# Patient Record
Sex: Female | Born: 1965 | Race: Black or African American | Hispanic: No | Marital: Single | Smoking: Former smoker
Health system: Southern US, Community
[De-identification: ages and names within clinical notes are randomized; demographics above are authoritative.]

## PROBLEM LIST (undated history)

## (undated) DIAGNOSIS — C801 Malignant (primary) neoplasm, unspecified: Secondary | ICD-10-CM

## (undated) DIAGNOSIS — K5730 Diverticulosis of large intestine without perforation or abscess without bleeding: Secondary | ICD-10-CM

## (undated) DIAGNOSIS — S24153A Other incomplete lesion at T7-T10 level of thoracic spinal cord, initial encounter: Secondary | ICD-10-CM

## (undated) DIAGNOSIS — N838 Other noninflammatory disorders of ovary, fallopian tube and broad ligament: Secondary | ICD-10-CM

## (undated) DIAGNOSIS — H409 Unspecified glaucoma: Secondary | ICD-10-CM

## (undated) DIAGNOSIS — G629 Polyneuropathy, unspecified: Secondary | ICD-10-CM

## (undated) DIAGNOSIS — M5126 Other intervertebral disc displacement, lumbar region: Secondary | ICD-10-CM

## (undated) DIAGNOSIS — O1490 Unspecified pre-eclampsia, unspecified trimester: Secondary | ICD-10-CM

## (undated) DIAGNOSIS — F4310 Post-traumatic stress disorder, unspecified: Secondary | ICD-10-CM

## (undated) DIAGNOSIS — B019 Varicella without complication: Secondary | ICD-10-CM

## (undated) DIAGNOSIS — G43909 Migraine, unspecified, not intractable, without status migrainosus: Secondary | ICD-10-CM

## (undated) DIAGNOSIS — M1990 Unspecified osteoarthritis, unspecified site: Secondary | ICD-10-CM

## (undated) DIAGNOSIS — D500 Iron deficiency anemia secondary to blood loss (chronic): Secondary | ICD-10-CM

## (undated) DIAGNOSIS — N920 Excessive and frequent menstruation with regular cycle: Secondary | ICD-10-CM

## (undated) DIAGNOSIS — R739 Hyperglycemia, unspecified: Secondary | ICD-10-CM

## (undated) DIAGNOSIS — K219 Gastro-esophageal reflux disease without esophagitis: Secondary | ICD-10-CM

## (undated) DIAGNOSIS — R569 Unspecified convulsions: Secondary | ICD-10-CM

## (undated) DIAGNOSIS — F909 Attention-deficit hyperactivity disorder, unspecified type: Secondary | ICD-10-CM

## (undated) DIAGNOSIS — Z9289 Personal history of other medical treatment: Secondary | ICD-10-CM

## (undated) DIAGNOSIS — J449 Chronic obstructive pulmonary disease, unspecified: Secondary | ICD-10-CM

## (undated) DIAGNOSIS — I2699 Other pulmonary embolism without acute cor pulmonale: Secondary | ICD-10-CM

## (undated) DIAGNOSIS — R7989 Other specified abnormal findings of blood chemistry: Secondary | ICD-10-CM

## (undated) DIAGNOSIS — I10 Essential (primary) hypertension: Secondary | ICD-10-CM

## (undated) DIAGNOSIS — E119 Type 2 diabetes mellitus without complications: Secondary | ICD-10-CM

## (undated) DIAGNOSIS — E042 Nontoxic multinodular goiter: Secondary | ICD-10-CM

## (undated) DIAGNOSIS — M419 Scoliosis, unspecified: Secondary | ICD-10-CM

## (undated) DIAGNOSIS — J45909 Unspecified asthma, uncomplicated: Secondary | ICD-10-CM

## (undated) HISTORY — DX: Other intervertebral disc displacement, lumbar region: M51.26

## (undated) HISTORY — PX: CHOLECYSTECTOMY: SHX55

## (undated) HISTORY — DX: Nontoxic multinodular goiter: E04.2

## (undated) HISTORY — DX: Diverticulosis of large intestine without perforation or abscess without bleeding: K57.30

## (undated) HISTORY — DX: Chronic obstructive pulmonary disease, unspecified: J44.9

## (undated) HISTORY — DX: Type 2 diabetes mellitus without complications: E11.9

---
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
## 2005-01-02 ENCOUNTER — Ambulatory Visit: Payer: Self-pay | Admitting: Internal Medicine

## 2005-05-06 ENCOUNTER — Emergency Department (HOSPITAL_COMMUNITY): Admission: EM | Admit: 2005-05-06 | Discharge: 2005-05-06 | Payer: Self-pay | Admitting: Emergency Medicine

## 2005-08-02 ENCOUNTER — Emergency Department (HOSPITAL_COMMUNITY): Admission: EM | Admit: 2005-08-02 | Discharge: 2005-08-02 | Payer: Self-pay | Admitting: Emergency Medicine

## 2005-08-07 ENCOUNTER — Ambulatory Visit: Payer: Self-pay | Admitting: Internal Medicine

## 2005-08-08 ENCOUNTER — Ambulatory Visit (HOSPITAL_COMMUNITY): Admission: RE | Admit: 2005-08-08 | Discharge: 2005-08-08 | Payer: Self-pay | Admitting: Internal Medicine

## 2005-08-19 ENCOUNTER — Ambulatory Visit: Payer: Self-pay | Admitting: Internal Medicine

## 2005-12-11 ENCOUNTER — Ambulatory Visit: Payer: Self-pay | Admitting: Family Medicine

## 2006-01-09 ENCOUNTER — Ambulatory Visit (HOSPITAL_COMMUNITY): Admission: RE | Admit: 2006-01-09 | Discharge: 2006-01-09 | Payer: Self-pay | Admitting: Family Medicine

## 2006-01-10 ENCOUNTER — Ambulatory Visit: Payer: Self-pay | Admitting: Family Medicine

## 2006-02-15 ENCOUNTER — Emergency Department (HOSPITAL_COMMUNITY): Admission: EM | Admit: 2006-02-15 | Discharge: 2006-02-16 | Payer: Self-pay | Admitting: Emergency Medicine

## 2008-02-17 ENCOUNTER — Emergency Department (HOSPITAL_COMMUNITY): Admission: EM | Admit: 2008-02-17 | Discharge: 2008-02-17 | Payer: Self-pay | Admitting: Emergency Medicine

## 2014-07-01 DIAGNOSIS — I2699 Other pulmonary embolism without acute cor pulmonale: Secondary | ICD-10-CM

## 2014-07-01 HISTORY — DX: Other pulmonary embolism without acute cor pulmonale: I26.99

## 2014-08-30 DIAGNOSIS — Z9289 Personal history of other medical treatment: Secondary | ICD-10-CM

## 2014-08-30 HISTORY — DX: Personal history of other medical treatment: Z92.89

## 2014-09-07 ENCOUNTER — Inpatient Hospital Stay (HOSPITAL_COMMUNITY): Payer: Medicaid Other

## 2014-09-07 ENCOUNTER — Inpatient Hospital Stay (HOSPITAL_COMMUNITY)
Admission: EM | Admit: 2014-09-07 | Discharge: 2014-09-09 | DRG: 812 | Disposition: A | Discharge status: Home / Self Care | Payer: Medicaid Other | Attending: Internal Medicine | Admitting: Internal Medicine

## 2014-09-07 ENCOUNTER — Encounter (HOSPITAL_COMMUNITY): Payer: Self-pay | Admitting: Emergency Medicine

## 2014-09-07 DIAGNOSIS — G629 Polyneuropathy, unspecified: Secondary | ICD-10-CM | POA: Diagnosis present

## 2014-09-07 DIAGNOSIS — M419 Scoliosis, unspecified: Secondary | ICD-10-CM | POA: Diagnosis present

## 2014-09-07 DIAGNOSIS — D259 Leiomyoma of uterus, unspecified: Secondary | ICD-10-CM | POA: Diagnosis present

## 2014-09-07 DIAGNOSIS — D509 Iron deficiency anemia, unspecified: Secondary | ICD-10-CM

## 2014-09-07 DIAGNOSIS — N920 Excessive and frequent menstruation with regular cycle: Secondary | ICD-10-CM | POA: Diagnosis present

## 2014-09-07 DIAGNOSIS — F4310 Post-traumatic stress disorder, unspecified: Secondary | ICD-10-CM | POA: Diagnosis present

## 2014-09-07 DIAGNOSIS — R0609 Other forms of dyspnea: Secondary | ICD-10-CM | POA: Insufficient documentation

## 2014-09-07 DIAGNOSIS — M1990 Unspecified osteoarthritis, unspecified site: Secondary | ICD-10-CM | POA: Diagnosis present

## 2014-09-07 DIAGNOSIS — D500 Iron deficiency anemia secondary to blood loss (chronic): Secondary | ICD-10-CM | POA: Diagnosis present

## 2014-09-07 DIAGNOSIS — R739 Hyperglycemia, unspecified: Secondary | ICD-10-CM | POA: Diagnosis present

## 2014-09-07 DIAGNOSIS — Z23 Encounter for immunization: Secondary | ICD-10-CM

## 2014-09-07 DIAGNOSIS — G43909 Migraine, unspecified, not intractable, without status migrainosus: Secondary | ICD-10-CM | POA: Diagnosis present

## 2014-09-07 DIAGNOSIS — R0789 Other chest pain: Secondary | ICD-10-CM

## 2014-09-07 DIAGNOSIS — D649 Anemia, unspecified: Secondary | ICD-10-CM | POA: Diagnosis present

## 2014-09-07 DIAGNOSIS — F909 Attention-deficit hyperactivity disorder, unspecified type: Secondary | ICD-10-CM | POA: Diagnosis present

## 2014-09-07 DIAGNOSIS — J45909 Unspecified asthma, uncomplicated: Secondary | ICD-10-CM | POA: Diagnosis present

## 2014-09-07 DIAGNOSIS — F17210 Nicotine dependence, cigarettes, uncomplicated: Secondary | ICD-10-CM | POA: Diagnosis present

## 2014-09-07 DIAGNOSIS — D219 Benign neoplasm of connective and other soft tissue, unspecified: Secondary | ICD-10-CM | POA: Diagnosis present

## 2014-09-07 DIAGNOSIS — K219 Gastro-esophageal reflux disease without esophagitis: Secondary | ICD-10-CM | POA: Diagnosis present

## 2014-09-07 DIAGNOSIS — G43009 Migraine without aura, not intractable, without status migrainosus: Secondary | ICD-10-CM

## 2014-09-07 DIAGNOSIS — R600 Localized edema: Secondary | ICD-10-CM | POA: Diagnosis present

## 2014-09-07 DIAGNOSIS — R7989 Other specified abnormal findings of blood chemistry: Secondary | ICD-10-CM | POA: Diagnosis present

## 2014-09-07 DIAGNOSIS — N838 Other noninflammatory disorders of ovary, fallopian tube and broad ligament: Secondary | ICD-10-CM | POA: Diagnosis present

## 2014-09-07 DIAGNOSIS — R0602 Shortness of breath: Secondary | ICD-10-CM | POA: Diagnosis present

## 2014-09-07 DIAGNOSIS — R0600 Dyspnea, unspecified: Secondary | ICD-10-CM | POA: Diagnosis present

## 2014-09-07 DIAGNOSIS — Z8742 Personal history of other diseases of the female genital tract: Secondary | ICD-10-CM

## 2014-09-07 HISTORY — DX: Post-traumatic stress disorder, unspecified: F43.10

## 2014-09-07 HISTORY — DX: Migraine, unspecified, not intractable, without status migrainosus: G43.909

## 2014-09-07 HISTORY — DX: Iron deficiency anemia secondary to blood loss (chronic): D50.0

## 2014-09-07 HISTORY — DX: Hyperglycemia, unspecified: R73.9

## 2014-09-07 HISTORY — DX: Unspecified osteoarthritis, unspecified site: M19.90

## 2014-09-07 HISTORY — DX: Other specified abnormal findings of blood chemistry: R79.89

## 2014-09-07 HISTORY — DX: Other noninflammatory disorders of ovary, fallopian tube and broad ligament: N83.8

## 2014-09-07 HISTORY — DX: Attention-deficit hyperactivity disorder, unspecified type: F90.9

## 2014-09-07 HISTORY — DX: Gastro-esophageal reflux disease without esophagitis: K21.9

## 2014-09-07 HISTORY — DX: Scoliosis, unspecified: M41.9

## 2014-09-07 HISTORY — DX: Excessive and frequent menstruation with regular cycle: N92.0

## 2014-09-07 HISTORY — DX: Polyneuropathy, unspecified: G62.9

## 2014-09-07 HISTORY — DX: Unspecified asthma, uncomplicated: J45.909

## 2014-09-07 LAB — CBC WITH DIFFERENTIAL/PLATELET
Basophils Absolute: 0 10*3/uL (ref 0.0–0.1)
Basophils Relative: 0 % (ref 0–1)
Eosinophils Absolute: 0.2 10*3/uL (ref 0.0–0.7)
Eosinophils Relative: 2 % (ref 0–5)
HCT: 23.7 % — ABNORMAL LOW (ref 36.0–46.0)
Hemoglobin: 6 g/dL — CL (ref 12.0–15.0)
Lymphocytes Relative: 30 % (ref 12–46)
Lymphs Abs: 3.4 10*3/uL (ref 0.7–4.0)
MCH: 15.6 pg — ABNORMAL LOW (ref 26.0–34.0)
MCHC: 25.3 g/dL — ABNORMAL LOW (ref 30.0–36.0)
MCV: 61.6 fL — ABNORMAL LOW (ref 78.0–100.0)
Monocytes Absolute: 0.8 10*3/uL (ref 0.1–1.0)
Monocytes Relative: 7 % (ref 3–12)
Neutro Abs: 6.8 10*3/uL (ref 1.7–7.7)
Neutrophils Relative %: 61 % (ref 43–77)
Platelets: 547 10*3/uL — ABNORMAL HIGH (ref 150–400)
RBC: 3.85 MIL/uL — ABNORMAL LOW (ref 3.87–5.11)
RDW: 21.9 % — AB (ref 11.5–15.5)
WBC: 11.2 10*3/uL — ABNORMAL HIGH (ref 4.0–10.5)

## 2014-09-07 LAB — COMPREHENSIVE METABOLIC PANEL
ALT: 10 U/L (ref 0–35)
AST: 12 U/L (ref 0–37)
Albumin: 3.7 g/dL (ref 3.5–5.2)
Alkaline Phosphatase: 73 U/L (ref 39–117)
Anion gap: 5 (ref 5–15)
BUN: 10 mg/dL (ref 6–23)
CHLORIDE: 106 mmol/L (ref 96–112)
CO2: 26 mmol/L (ref 19–32)
Calcium: 8.8 mg/dL (ref 8.4–10.5)
Creatinine, Ser: 0.64 mg/dL (ref 0.50–1.10)
GFR calc Af Amer: >90 mL/min (ref >90)
GFR calc non Af Amer: >90 mL/min (ref >90)
Glucose, Bld: 87 mg/dL (ref 70–99)
Potassium: 3.8 mmol/L (ref 3.5–5.1)
Sodium: 137 mmol/L (ref 135–145)
Total Bilirubin: 0.4 mg/dL (ref 0.3–1.2)
Total Protein: 6.9 g/dL (ref 6.0–8.3)

## 2014-09-07 LAB — D-DIMER, QUANTITATIVE: D-Dimer, Quant: <0.27 ug/mL-FEU (ref 0.00–0.48)

## 2014-09-07 LAB — POC OCCULT BLOOD, ED: Fecal Occult Bld: NEGATIVE

## 2014-09-07 MED ORDER — DIPHENHYDRAMINE HCL 50 MG/ML IJ SOLN
25.0000 mg | Freq: Once | INTRAMUSCULAR | Status: AC
  Administered 2014-09-07: 25 mg via INTRAVENOUS
  Filled 2014-09-07: qty 1

## 2014-09-07 MED ORDER — SODIUM CHLORIDE 0.9 % IV SOLN
Freq: Once | INTRAVENOUS | Status: AC
  Administered 2014-09-08: 05:00:00 via INTRAVENOUS

## 2014-09-07 MED ORDER — METOCLOPRAMIDE HCL 5 MG/ML IJ SOLN
10.0000 mg | Freq: Once | INTRAMUSCULAR | Status: AC
  Administered 2014-09-07: 10 mg via INTRAVENOUS
  Filled 2014-09-07: qty 2

## 2014-09-07 MED ORDER — HYDROCODONE-ACETAMINOPHEN 5-325 MG PO TABS
1.0000 | ORAL_TABLET | ORAL | Status: DC | PRN
  Administered 2014-09-09: 1 via ORAL
  Filled 2014-09-07: qty 1

## 2014-09-07 MED ORDER — ACETAMINOPHEN 325 MG PO TABS
650.0000 mg | ORAL_TABLET | Freq: Four times a day (QID) | ORAL | Status: DC | PRN
Start: 2014-09-07 — End: 2014-09-09
  Administered 2014-09-08: 650 mg via ORAL
  Filled 2014-09-07: qty 2

## 2014-09-07 MED ORDER — SODIUM CHLORIDE 0.9 % IV SOLN: Freq: Once | INTRAVENOUS | Status: AC

## 2014-09-07 MED ORDER — SODIUM CHLORIDE 0.9 % IJ SOLN
3.0000 mL | Freq: Two times a day (BID) | INTRAMUSCULAR | Status: DC
  Administered 2014-09-08 – 2014-09-09 (×2): 3 mL via INTRAVENOUS

## 2014-09-07 MED ORDER — DIPHENHYDRAMINE HCL 25 MG PO CAPS
25.0000 mg | ORAL_CAPSULE | Freq: Once | ORAL | Status: AC
  Administered 2014-09-08: 25 mg via ORAL
  Filled 2014-09-07: qty 1

## 2014-09-07 MED ORDER — SODIUM CHLORIDE 0.9 % IV SOLN: 250.0000 mL | INTRAVENOUS | Status: DC | PRN

## 2014-09-07 MED ORDER — DIPHENHYDRAMINE HCL 25 MG PO CAPS
50.0000 mg | ORAL_CAPSULE | Freq: Every evening | ORAL | Status: DC | PRN
  Filled 2014-09-07: qty 2

## 2014-09-07 MED ORDER — ONDANSETRON HCL 4 MG PO TABS
4.0000 mg | ORAL_TABLET | Freq: Four times a day (QID) | ORAL | Status: DC | PRN
Start: 2014-09-07 — End: 2014-09-09

## 2014-09-07 MED ORDER — ACETAMINOPHEN 325 MG PO TABS
650.0000 mg | ORAL_TABLET | Freq: Once | ORAL | Status: AC
  Administered 2014-09-08: 650 mg via ORAL
  Filled 2014-09-07: qty 2

## 2014-09-07 MED ORDER — SODIUM CHLORIDE 0.9 % IJ SOLN
3.0000 mL | Freq: Two times a day (BID) | INTRAMUSCULAR | Status: DC
  Administered 2014-09-08 (×2): 3 mL via INTRAVENOUS

## 2014-09-07 MED ORDER — FUROSEMIDE 10 MG/ML IJ SOLN
20.0000 mg | Freq: Once | INTRAMUSCULAR | Status: AC
  Administered 2014-09-08: 20 mg via INTRAVENOUS
  Filled 2014-09-07: qty 2

## 2014-09-07 MED ORDER — DOCUSATE SODIUM 100 MG PO CAPS
100.0000 mg | ORAL_CAPSULE | Freq: Two times a day (BID) | ORAL | Status: DC
  Administered 2014-09-08 – 2014-09-09 (×4): 100 mg via ORAL
  Filled 2014-09-07 (×4): qty 1

## 2014-09-07 MED ORDER — ONDANSETRON HCL 4 MG/2ML IJ SOLN: 4.0000 mg | Freq: Four times a day (QID) | INTRAMUSCULAR | Status: DC | PRN

## 2014-09-07 MED ORDER — SODIUM CHLORIDE 0.9 % IJ SOLN: 3.0000 mL | INTRAMUSCULAR | Status: DC | PRN

## 2014-09-07 MED ORDER — ACETAMINOPHEN 650 MG RE SUPP: 650.0000 mg | Freq: Four times a day (QID) | RECTAL | Status: DC | PRN

## 2014-09-07 MED ORDER — DEXAMETHASONE SODIUM PHOSPHATE 10 MG/ML IJ SOLN
10.0000 mg | Freq: Once | INTRAMUSCULAR | Status: AC
  Administered 2014-09-07: 10 mg via INTRAVENOUS
  Filled 2014-09-07: qty 1

## 2014-09-07 NOTE — ED Notes (Signed)
Pt c/o migraine with nausea, has hx of such. Pt also c/o bilateral leg and feet swelling x 3 days.

## 2014-09-07 NOTE — H&P (Signed)
PCP:  none    Chief Complaint:  headache  HPI: Melinda [name] is a 49 y.o. female   has a past medical history of Migraine; PTSD (post-traumatic stress disorder); Asthma; GERD (gastroesophageal reflux disease); Scoliosis; Arthritis; ADHD (attention deficit hyperactivity disorder); and Peripheral neuropathy.   Presented with  Patient reports some shortness of breath, increased fatigue. She developed severe migraine today and presented to ER. She has hx of Migraines. Labs were obtained showing Hg of 6.0. She reports heavy menstrual flow lasting 10 days. Passing clots. Last period was 02/24. Last gynecological visit was very long time ago she cannot recollect.  She have had some leg edema for the past 1 week. Patient endorsed very short-lived bile 1-2 minutes chest pain which was stabbing in nature.   Hospitalist was called for admission for symptomatic anemia  Review of Systems:    Pertinent positives include:  dyspnea on exertion, Bilateral lower extremity swelling, headaches,  Constitutional:  No weight loss, night sweats, Fevers, chills, fatigue, weight loss  HEENT:  No  Difficulty swallowing,Tooth/dental problems,Sore throat,  No sneezing, itching, ear ache, nasal congestion, post nasal drip,  Cardio-vascular:  No chest pain, Orthopnea, PND, anasarca, dizziness, palpitations.no   GI:  No heartburn, indigestion, abdominal pain, nausea, vomiting, diarrhea, change in bowel habits, loss of appetite, melena, blood in stool, hematemesis Resp:  no shortness of breath at rest. No  No excess mucus, no productive cough, No non-productive cough, No coughing up of blood.No change in color of mucus.No wheezing. Skin:  no rash or lesions. No jaundice GU:  no dysuria, change in color of urine, no urgency or frequency. No straining to urinate.  No flank pain.  Musculoskeletal:  No joint pain or no joint swelling. No decreased range of motion. No back pain.  Psych:  No change in mood or  affect. No depression or anxiety. No memory loss.  Neuro: no localizing neurological complaints, no tingling, no weakness, no double vision, no gait abnormality, no slurred speech, no confusion  Otherwise ROS are negative except for above, 10 systems were reviewed  Past Medical History: Past Medical History  Diagnosis Date  . Migraine   . PTSD (post-traumatic stress disorder)   . Asthma   . GERD (gastroesophageal reflux disease)   . Scoliosis   . Arthritis   . ADHD (attention deficit hyperactivity disorder)   . Peripheral neuropathy    Past Surgical History  Procedure Laterality Date  . Cholecystectomy       Medications: Prior to Admission medications   Medication Sig Start Date End Date Taking? Authorizing Provider  diphenhydrAMINE (BENADRYL) 25 MG tablet Take 50 mg by mouth at bedtime as needed for sleep (sleep).   Yes Historical Provider, MD  Multiple Vitamins-Minerals (MULTIVITAMIN & MINERAL PO) Take 1 tablet by mouth daily.   Yes Historical Provider, MD  naproxen sodium (ANAPROX) 220 MG tablet Take 440 mg by mouth at bedtime.   Yes Historical Provider, MD    Allergies:   Allergies  Allergen Reactions  . Penicillins Shortness Of Breath  . Rocephin [Ceftriaxone Sodium In Dextrose] Anaphylaxis  . Shellfish Allergy Anaphylaxis  . Citrus Rash    Social History:  Ambulatory with cane,   Lives at home alone,         reports that she has been smoking.  She does not have any smokeless tobacco history on file. She reports that she does not drink alcohol or use illicit drugs.    Family History: family history includes  Alcohol abuse in her father; Breast cancer in her mother; CAD in her mother; Diabetes in her mother; Hypertension in her father and mother.    Physical Exam: Patient Vitals for the past 24 hrs:  BP Temp Temp src Pulse Resp SpO2  09/07/14 2200 118/58 mmHg - - 98 17 96 %  09/07/14 2130 (!) 122/51 mmHg - - 98 12 98 %  09/07/14 2100 (!) 107/47 mmHg - -  87 14 95 %  09/07/14 1831 139/71 mmHg 98.6 F (37 C) Oral 92 20 97 %  09/07/14 1611 161/81 mmHg 98.5 F (36.9 C) Oral 101 18 97 %  09/07/14 1311 140/80 mmHg 98 F (36.7 C) Oral 112 22 99 %    1. General:  in No Acute distress 2. Psychological: Alert and   Oriented 3. Head/ENT:   Moist Mucous Membranes                          Head Non traumatic, neck supple                          Normal   Dentition 4. SKIN: normal   Skin turgor,  Skin clean Dry and intact no rash 5. Heart: Regular rate and rhythm no Murmur, Rub or gallop 6. Lungs  no wheezes mild crackles   7. Abdomen: Soft, non-tender, Non distended, obese 8. Lower extremities: no clubbing, cyanosis,  3+ edema 9. Neurologically Grossly intact, moving all 4 extremities equally 10. MSK: Normal range of motion  body mass index is unknown because there is no height or weight on file.   Labs on Admission:   Results for orders placed or performed during the hospital encounter of 09/07/14 (from the past 24 hour(s))  CBC with Differential     Status: Abnormal   Collection Time: 09/07/14  8:25 PM  Result Value Ref Range   WBC 11.2 (H) 4.0 - 10.5 K/uL   RBC 3.85 (L) 3.87 - 5.11 MIL/uL   Hemoglobin 6.0 (LL) 12.0 - 15.0 g/dL   HCT 23.7 (L) 36.0 - 46.0 %   MCV 61.6 (L) 78.0 - 100.0 fL   MCH 15.6 (L) 26.0 - 34.0 pg   MCHC 25.3 (L) 30.0 - 36.0 g/dL   RDW 21.9 (H) 11.5 - 15.5 %   Platelets 547 (H) 150 - 400 K/uL   Neutrophils Relative % 61 43 - 77 %   Lymphocytes Relative 30 12 - 46 %   Monocytes Relative 7 3 - 12 %   Eosinophils Relative 2 0 - 5 %   Basophils Relative 0 0 - 1 %   Neutro Abs 6.8 1.7 - 7.7 K/uL   Lymphs Abs 3.4 0.7 - 4.0 K/uL   Monocytes Absolute 0.8 0.1 - 1.0 K/uL   Eosinophils Absolute 0.2 0.0 - 0.7 K/uL   Basophils Absolute 0.0 0.0 - 0.1 K/uL   RBC Morphology POLYCHROMASIA PRESENT   Comprehensive metabolic panel     Status: None   Collection Time: 09/07/14  8:25 PM  Result Value Ref Range   Sodium 137 135 -  145 mmol/L   Potassium 3.8 3.5 - 5.1 mmol/L   Chloride 106 96 - 112 mmol/L   CO2 26 19 - 32 mmol/L   Glucose, Bld 87 70 - 99 mg/dL   BUN 10 6 - 23 mg/dL   Creatinine, Ser 0.64 0.50 - 1.10 mg/dL   Calcium 8.8 8.4 -  10.5 mg/dL   Total Protein 6.9 6.0 - 8.3 g/dL   Albumin 3.7 3.5 - 5.2 g/dL   AST 12 0 - 37 U/L   ALT 10 0 - 35 U/L   Alkaline Phosphatase 73 39 - 117 U/L   Total Bilirubin 0.4 0.3 - 1.2 mg/dL   GFR calc non Af Amer >90 >90 mL/min   GFR calc Af Amer >90 >90 mL/min   Anion gap 5 5 - 15  D-dimer, quantitative     Status: None   Collection Time: 09/07/14  8:25 PM  Result Value Ref Range   D-Dimer, Quant <0.27 0.00 - 0.48 ug/mL-FEU  POC occult blood, ED     Status: None   Collection Time: 09/07/14  9:53 PM  Result Value Ref Range   Fecal Occult Bld NEGATIVE NEGATIVE    UA not obtained  No results found for: HGBA1C  CrCl cannot be calculated (Unknown ideal weight.).  BNP (last 3 results) No results for input(s): PROBNP in the last 8760 hours.  Other results:  I have pearsonaly reviewed this: ECG REPORT  Rate: 93  Rhythm: NSR ST&T Change: no ischemic changes   There were no vitals filed for this visit.   Cultures: No results found for: [place], [place], [place], REPTSTATUS   Radiological Exams on Admission: No results found.  Chart has been reviewed  Assessment/Plan 49 yo F with history of heavy menstrual bleeding and migraine presented with migraine which was easily controlled but was found to have hemoglobin of 6. Patient was noted to have lower extremity edema mild crackles or chest exam worked up for possible heart failure  Present on Admission:  . Symptomatic anemia- no history of GI blood loss Hemoccult-negative most likely source frequent and significant menstrual bleeding. We'll need to have OB/GYN evaluation for now we'll transfuse 2 units  . Other chest pain - atypical given dyspnea level low extremity edema will monitor on telemetry evaluate  for CHF  . Bilateral edema of lower extremity will evaluate for CHF monitor on telemetry  . Dyspnea likely multifactorial secondary to deconditioning, anemia, but we'll also evaluate for CHF given crackles on chest exam will obtain chest x-ray   Prophylaxis: SCD  , Protonix  CODE STATUS:  FULL CODE  Other plan as per orders.  I have spent a total of 55 min on this admission  Jalene Lacko 09/07/2014, 11:19 PM  [facility]  Pager [phone]   after 2 AM please page floor coverage PA If 7AM-7PM, please contact the day team taking care of the patient  [email or web address]  Password [email or web address]

## 2014-09-07 NOTE — ED Provider Notes (Signed)
CSN: [id number]     Arrival date & time 09/07/14  1305 History   First MD Initiated Contact with Patient 09/07/14 1803     Chief Complaint  Patient presents with  . Migraine  . Nausea  . Leg Swelling     (Consider location/radiation/quality/duration/timing/severity/associated sxs/prior Treatment) HPI Comments: Patient presents with a migraine. She has a history of migraines and says that her headache started earlier today. It feels similar to her past migraines. She says it feels like a vice is all around her head. She said it started gradually and has been worsening throughout the day. She has some associated nausea and vomiting as well as photophobia. She denies any fevers or neck pain. She denies any recent head injuries. She states that her legs up and swelling for the last 3 days. She states that she has upcoming jury duty and she's been trying to get more mobile at home and anticipating of getting out of the house for jury duty and she feels that her increased activity have caused her legs to be more swollen. She's had some occasional increase in shortness of breath and she's not sure if this is from her asthma. At times she has some pain with breathing. She denies any pain in her legs. She does have a history of a DVT that was associated with pregnancy. She is not currently on anticoagulants. She denies any cough or chest congestion. She denies any current shortness of breath. She also has had some increased fatigue and dizziness at times.  Patient is a 49 y.o. female presenting with migraines.  Migraine Associated symptoms include chest pain, headaches and shortness of breath. Pertinent negatives include no abdominal pain.    Past Medical History  Diagnosis Date  . Migraine   . PTSD (post-traumatic stress disorder)   . Asthma   . GERD (gastroesophageal reflux disease)   . Scoliosis   . Arthritis   . ADHD (attention deficit hyperactivity disorder)   . Peripheral neuropathy    Past  Surgical History  Procedure Laterality Date  . Cholecystectomy     No family history on file. History  Substance Use Topics  . Smoking status: Current Some Day Smoker  . Smokeless tobacco: Not on file  . Alcohol Use: No   OB History    No data available     Review of Systems  Constitutional: Negative for fever, chills, diaphoresis and fatigue.  HENT: Negative for congestion, rhinorrhea and sneezing.   Eyes: Positive for photophobia.  Respiratory: Positive for shortness of breath. Negative for cough and chest tightness.   Cardiovascular: Positive for chest pain and leg swelling.  Gastrointestinal: Positive for nausea and vomiting. Negative for abdominal pain, diarrhea and blood in stool.  Genitourinary: Negative for frequency, hematuria, flank pain and difficulty urinating.  Musculoskeletal: Negative for back pain and arthralgias.  Skin: Negative for rash.  Neurological: Positive for headaches. Negative for dizziness, speech difficulty, weakness and numbness.      Allergies  Penicillins; Rocephin; Shellfish allergy; and Citrus  Home Medications   Prior to Admission medications   Medication Sig Start Date End Date Taking? Authorizing Provider  diphenhydrAMINE (BENADRYL) 25 MG tablet Take 50 mg by mouth at bedtime as needed for sleep (sleep).   Yes Historical Provider, MD  Multiple Vitamins-Minerals (MULTIVITAMIN & MINERAL PO) Take 1 tablet by mouth daily.   Yes Historical Provider, MD  naproxen sodium (ANAPROX) 220 MG tablet Take 440 mg by mouth at bedtime.   Yes Historical  Provider, MD   BP 139/71 mmHg  Pulse 92  Temp(Src) 98.6 F (37 C) (Oral)  Resp 20  SpO2 97% Physical Exam  Constitutional: She is oriented to person, place, and time. She appears well-developed and well-nourished.  HENT:  Head: Normocephalic and atraumatic.  Eyes: Pupils are equal, round, and reactive to light.  Neck: Normal range of motion. Neck supple.  No meningismus  Cardiovascular: Normal  rate, regular rhythm and normal heart sounds.   Pulmonary/Chest: Effort normal and breath sounds normal. No respiratory distress. She has no wheezes. She has no rales. She exhibits no tenderness.  Abdominal: Soft. Bowel sounds are normal. There is no tenderness. There is no rebound and no guarding.  Musculoskeletal: Normal range of motion. She exhibits no edema.  Lymphadenopathy:    She has no cervical adenopathy.  Neurological: She is alert and oriented to person, place, and time. She has normal strength. No cranial nerve deficit or sensory deficit. GCS eye subscore is 4. GCS verbal subscore is 5. GCS motor subscore is 6.  Skin: Skin is warm and dry. No rash noted.  Psychiatric: She has a normal mood and affect.    ED Course  Procedures (including critical care time) Labs Review Labs Reviewed  CBC WITH DIFFERENTIAL/PLATELET - Abnormal; Notable for the following:    WBC 11.2 (*)    RBC 3.85 (*)    Hemoglobin 6.0 (*)    HCT 23.7 (*)    MCV 61.6 (*)    MCH 15.6 (*)    MCHC 25.3 (*)    RDW 21.9 (*)    Platelets 547 (*)    All other components within normal limits  COMPREHENSIVE METABOLIC PANEL  D-DIMER, QUANTITATIVE  OCCULT BLOOD X 1 CARD TO LAB, STOOL  POC OCCULT BLOOD, ED    Imaging Review No results found.   EKG Interpretation   Date/Time:  Wednesday September 07 2014 20:50:22 EST Ventricular Rate:  93 PR Interval:  152 QRS Duration: 67 QT Interval:  363 QTC Calculation: 451 R Axis:   41 Text Interpretation:  Sinus rhythm No old tracing to compare Confirmed by  Keilen Kahl  MD, Fielding Mault ([id number]) on 09/07/2014 10:35:31 PM      MDM   Final diagnoses:  Iron deficiency anemia  Nonintractable migraine, unspecified migraine type    Patient presents with migraine. Her migraine is better after migraine cocktail. She doesn't have any other unusual symptoms that would be more suggestive of subarachnoid hemorrhage or meningitis. She has no neurologic deficits. She's also complaining  of some increased fatigue shortness of breath and some pedal edema. Her lungs are clear on exam. Her oxygen saturation saturations are normal. However she is markedly anemic with a hemoglobin of 6. She states that she has a history of anemia with heavy periods. She doesn't know what her baseline hemoglobin is. I attempted to look up her records on care everywhere and I can't find any old records. However given her worsening symptoms over the last 2 weeks and the hemoglobin of 6, I feel that she should be admitted for a blood transfusion. I will consult the hospitalist for admission.    Malvin Johns, MD 09/07/14 2237

## 2014-09-08 ENCOUNTER — Inpatient Hospital Stay (HOSPITAL_COMMUNITY): Payer: Medicaid Other

## 2014-09-08 DIAGNOSIS — D649 Anemia, unspecified: Secondary | ICD-10-CM

## 2014-09-08 DIAGNOSIS — N921 Excessive and frequent menstruation with irregular cycle: Secondary | ICD-10-CM

## 2014-09-08 DIAGNOSIS — R7989 Other specified abnormal findings of blood chemistry: Secondary | ICD-10-CM

## 2014-09-08 DIAGNOSIS — N920 Excessive and frequent menstruation with regular cycle: Secondary | ICD-10-CM

## 2014-09-08 DIAGNOSIS — R946 Abnormal results of thyroid function studies: Secondary | ICD-10-CM

## 2014-09-08 HISTORY — DX: Other specified abnormal findings of blood chemistry: R79.89

## 2014-09-08 HISTORY — DX: Excessive and frequent menstruation with regular cycle: N92.0

## 2014-09-08 LAB — COMPREHENSIVE METABOLIC PANEL
ALT: 11 U/L (ref 0–35)
ANION GAP: 3 — AB (ref 5–15)
AST: 14 U/L (ref 0–37)
Albumin: 4 g/dL (ref 3.5–5.2)
Alkaline Phosphatase: 73 U/L (ref 39–117)
BUN: 10 mg/dL (ref 6–23)
CO2: 26 mmol/L (ref 19–32)
CREATININE: 0.75 mg/dL (ref 0.50–1.10)
Calcium: 8.5 mg/dL (ref 8.4–10.5)
Chloride: 105 mmol/L (ref 96–112)
GFR calc Af Amer: >90 mL/min (ref >90)
GFR calc non Af Amer: >90 mL/min (ref >90)
Glucose, Bld: 185 mg/dL — ABNORMAL HIGH (ref 70–99)
POTASSIUM: 3.7 mmol/L (ref 3.5–5.1)
SODIUM: 134 mmol/L — AB (ref 135–145)
TOTAL PROTEIN: 7.2 g/dL (ref 6.0–8.3)
Total Bilirubin: 0.5 mg/dL (ref 0.3–1.2)

## 2014-09-08 LAB — PHOSPHORUS: Phosphorus: 3.8 mg/dL (ref 2.3–4.6)

## 2014-09-08 LAB — LIPID PANEL
CHOLESTEROL: 159 mg/dL (ref 0–200)
HDL: 58 mg/dL (ref >39)
LDL Cholesterol: 83 mg/dL (ref 0–99)
TRIGLYCERIDES: 89 mg/dL (ref <150)
Total CHOL/HDL Ratio: 2.7 RATIO
VLDL: 18 mg/dL (ref 0–40)

## 2014-09-08 LAB — CBC
HEMATOCRIT: 25.2 % — AB (ref 36.0–46.0)
HEMOGLOBIN: 6.3 g/dL — AB (ref 12.0–15.0)
MCH: 15.4 pg — ABNORMAL LOW (ref 26.0–34.0)
MCHC: 25 g/dL — AB (ref 30.0–36.0)
MCV: 61.5 fL — ABNORMAL LOW (ref 78.0–100.0)
PLATELETS: 496 10*3/uL — AB (ref 150–400)
RBC: 4.1 MIL/uL (ref 3.87–5.11)
RDW: 22.1 % — ABNORMAL HIGH (ref 11.5–15.5)
WBC: 11.4 10*3/uL — AB (ref 4.0–10.5)

## 2014-09-08 LAB — MAGNESIUM: MAGNESIUM: 1.8 mg/dL (ref 1.5–2.5)

## 2014-09-08 LAB — HEMOGLOBIN AND HEMATOCRIT, BLOOD
HEMATOCRIT: 28.4 % — AB (ref 36.0–46.0)
Hemoglobin: 7.7 g/dL — ABNORMAL LOW (ref 12.0–15.0)

## 2014-09-08 LAB — RETICULOCYTES
RBC.: 4 MIL/uL (ref 3.87–5.11)
Retic Count, Absolute: 136 10*3/uL (ref 19.0–186.0)
Retic Ct Pct: 3.4 % — ABNORMAL HIGH (ref 0.4–3.1)

## 2014-09-08 LAB — IRON AND TIBC
Iron: <10 ug/dL — ABNORMAL LOW (ref 42–145)
UIBC: 433 ug/dL — ABNORMAL HIGH (ref 125–400)

## 2014-09-08 LAB — TSH: TSH: 0.324 u[IU]/mL — ABNORMAL LOW (ref 0.350–4.500)

## 2014-09-08 LAB — BRAIN NATRIURETIC PEPTIDE: B Natriuretic Peptide: 192.8 pg/mL — ABNORMAL HIGH (ref 0.0–100.0)

## 2014-09-08 LAB — VITAMIN B12: Vitamin B-12: 403 pg/mL (ref 211–911)

## 2014-09-08 LAB — PREPARE RBC (CROSSMATCH)

## 2014-09-08 LAB — FERRITIN: Ferritin: 4 ng/mL — ABNORMAL LOW (ref 10–291)

## 2014-09-08 LAB — FOLATE: Folate: 18.8 ng/mL

## 2014-09-08 LAB — ABO/RH: ABO/RH(D): O POS

## 2014-09-08 MED ORDER — PNEUMOCOCCAL VAC POLYVALENT 25 MCG/0.5ML IJ INJ
0.5000 mL | INJECTION | INTRAMUSCULAR | Status: AC
  Administered 2014-09-09: 0.5 mL via INTRAMUSCULAR
  Filled 2014-09-08 (×2): qty 0.5

## 2014-09-08 MED ORDER — INFLUENZA VAC SPLIT QUAD 0.5 ML IM SUSY
0.5000 mL | PREFILLED_SYRINGE | INTRAMUSCULAR | Status: AC
  Administered 2014-09-09: 0.5 mL via INTRAMUSCULAR
  Filled 2014-09-08 (×2): qty 0.5

## 2014-09-08 MED ORDER — NAPROXEN SODIUM 275 MG PO TABS
275.0000 mg | ORAL_TABLET | Freq: Two times a day (BID) | ORAL | Status: DC
  Administered 2014-09-08 – 2014-09-09 (×2): 275 mg via ORAL
  Filled 2014-09-08 (×5): qty 1

## 2014-09-08 NOTE — Progress Notes (Signed)
Called Lab about obtaining AM labs before patient's blood was started so that they would not be delayed. Lab was agreeable. Troponin delayed d/t pt having to go to Xray. Lab made aware. They will collect first troponin and 5AM labs and obtain second troponin and follow up CBC 2 hours post transfusion. Will continue to monitor.

## 2014-09-08 NOTE — Progress Notes (Signed)
Progress Note   Melinda [name] NOB:[id number] DOB: 1966/04/28 DOA: 09/07/2014 PCP: No primary care provider on file. [facility]   Brief Narrative:   Melinda [name] is an 49 y.o. female with a PMH of migraine headaches and PTSD as well as asthma, GERD, ADHD, menorrhagia and peripheral neuropathy who was admitted 09/07/14 with chief complaint of dyspnea, fatigue, and severe migraine headache. Upon initial evaluation in the ED, the patient was found to have a hemoglobin of 6.  Assessment/Plan:   Principal Problem:   Symptomatic anemia/dyspnea/chest pain in the setting of menorrhagia - 2 units of packed red blood cells being transfused. - Post transfusion hemoglobin pending. - Anemia panel pending. Fecal occult blood negative. - Chest x-ray negative for pulmonary edema or other acute cardiopulmonary process.  Active Problems:   Asthma - No attacks since 2005.    Pelvic/Back Pain in the setting of menorrhagia - Likely from uterine cramps.  Resume Naproxsyn. - Check transvaginal ultrasound.    Hyperglycemia - Hemoglobin A1c pending. +FH of DM.    Bilateral edema of lower extremity - Reports acute onset.  Albumin WNL. - ? Related to elevated BP. - 2 D Echo ordered, but I do not hear any crackles, CXR neg for pulmonary edema, and only mild LE swelling so will cancel and check BNP.  Reorder if BNP abnormal. - S/P lasix x 1.    Low TSH - Likely sick euthyroid syndrome. Repeat TSH in 6 weeks.    DVT Prophylaxis - SCDs order.  Code Status: Full. Family Communication: No family at the bedside. Disposition Plan: Home when stable.   IV Access:    Peripheral IV   Procedures and diagnostic studies:   X-ray Chest Pa And Lateral 09/08/2014: No active cardiopulmonary disease.     Medical Consultants:    None.  Anti-Infectives:    None.  Subjective:   Melinda [name] reports lower abdominal/back pain.  She is spotting but does not have any significant vaginal bleeding.     Objective:    Filed Vitals:   09/07/14 2357 09/08/14 0018 09/08/14 0511 09/08/14 0528  BP:  153/77 144/64 110/80  Pulse:  102 95 100  Temp:  98.6 F (37 C) 98.2 F (36.8 C) 98.2 F (36.8 C)  TempSrc:  Oral Axillary Oral  Resp:  18 18 18   Height: 5' 9.75" (1.772 m) 5' 9.75" (1.772 m)    Weight:  128.776 kg (283 lb 14.4 oz)    SpO2:  99% 100% 100%    Intake/Output Summary (Last 24 hours) at 09/08/14 0824 Last data filed at 09/08/14 0600  Gross per 24 hour  Intake   16.5 ml  Output      0 ml  Net   16.5 ml    Exam: Gen:  NAD Cardiovascular:  RRR, No M/R/G Respiratory:  Lungs CTAB Gastrointestinal:  Abdomen soft, NT/ND, + BS Extremities: Trace edema   Data Reviewed:    Labs: Basic Metabolic Panel:  Recent Labs Lab 09/07/14 2025 09/08/14 0147  NA 137 134*  K 3.8 3.7  CL 106 105  CO2 26 26  GLUCOSE 87 185*  BUN 10 10  CREATININE 0.64 0.75  CALCIUM 8.8 8.5  MG  --  1.8  PHOS  --  3.8   GFR Estimated Creatinine Clearance: 128 mL/min (by C-G formula based on Cr of 0.75). Liver Function Tests:  Recent Labs Lab 09/07/14 2025 09/08/14 0147  AST 12 14  ALT 10 11  ALKPHOS  73 73  BILITOT 0.4 0.5  PROT 6.9 7.2  ALBUMIN 3.7 4.0   CBC:  Recent Labs Lab 09/07/14 2025 09/08/14 0147  WBC 11.2* 11.4*  NEUTROABS 6.8  --   HGB 6.0* 6.3*  HCT 23.7* 25.2*  MCV 61.6* 61.5*  PLT 547* 496*   D-Dimer:  Recent Labs  09/07/14 2025  DDIMER <0.27   Hgb A1c: No results for input(s): HGBA1C in the last 72 hours. Lipid Profile:  Recent Labs  09/08/14 0147  CHOL 159  HDL 58  LDLCALC 83  TRIG 89  CHOLHDL 2.7   Thyroid function studies:  Recent Labs  09/08/14 0147  TSH 0.324*   Anemia work up:  Recent Labs  09/07/14 2324  RETICCTPCT 3.4*   Sepsis Labs:  Recent Labs Lab 09/07/14 2025 09/08/14 0147  WBC 11.2* 11.4*   Microbiology No results found for this or any previous visit (from the past 240 hour(s)).   Medications:   .  docusate sodium  100 mg Oral BID  . [START ON 09/09/2014] Influenza vac split quadrivalent PF  0.5 mL Intramuscular Tomorrow-1000  . [START ON 09/09/2014] pneumococcal 23 valent vaccine  0.5 mL Intramuscular Tomorrow-1000  . sodium chloride  3 mL Intravenous Q12H  . sodium chloride  3 mL Intravenous Q12H   Continuous Infusions:   Time spent: 35 minutes with > 50% of time discussing current diagnostic test results, clinical impression and plan of care.    LOS: 1 day   RAMA,CHRISTINA  [facility] Pager [phone]. If unable to reach me by pager, please call my cell phone at [phone].  *Please refer to [email or web address], password [email or web address] to get updated schedule on who will round on this patient, as hospitalists switch teams weekly. If 7PM-7AM, please contact night-coverage at [email or web address], password [email or web address] for any overnight needs.  09/08/2014, 8:24 AM

## 2014-09-08 NOTE — Progress Notes (Signed)
Utilization review completed.  

## 2014-09-08 NOTE — Progress Notes (Signed)
No SCDs currently. (None left in hospital per [facility]). Pt ambulatory. Encouraging foot pumps.

## 2014-09-09 ENCOUNTER — Encounter (HOSPITAL_COMMUNITY): Payer: Self-pay | Admitting: Internal Medicine

## 2014-09-09 DIAGNOSIS — N838 Other noninflammatory disorders of ovary, fallopian tube and broad ligament: Secondary | ICD-10-CM

## 2014-09-09 DIAGNOSIS — N839 Noninflammatory disorder of ovary, fallopian tube and broad ligament, unspecified: Secondary | ICD-10-CM

## 2014-09-09 DIAGNOSIS — D500 Iron deficiency anemia secondary to blood loss (chronic): Secondary | ICD-10-CM

## 2014-09-09 DIAGNOSIS — D219 Benign neoplasm of connective and other soft tissue, unspecified: Secondary | ICD-10-CM | POA: Diagnosis present

## 2014-09-09 DIAGNOSIS — D259 Leiomyoma of uterus, unspecified: Secondary | ICD-10-CM

## 2014-09-09 DIAGNOSIS — R0609 Other forms of dyspnea: Secondary | ICD-10-CM | POA: Insufficient documentation

## 2014-09-09 DIAGNOSIS — R0602 Shortness of breath: Secondary | ICD-10-CM | POA: Insufficient documentation

## 2014-09-09 DIAGNOSIS — R739 Hyperglycemia, unspecified: Secondary | ICD-10-CM

## 2014-09-09 HISTORY — DX: Other noninflammatory disorders of ovary, fallopian tube and broad ligament: N83.8

## 2014-09-09 HISTORY — DX: Hyperglycemia, unspecified: R73.9

## 2014-09-09 HISTORY — DX: Iron deficiency anemia secondary to blood loss (chronic): D50.0

## 2014-09-09 LAB — HEMOGLOBIN A1C
Hgb A1c MFr Bld: 5.7 % — ABNORMAL HIGH (ref 4.8–5.6)
MEAN PLASMA GLUCOSE: 117 mg/dL

## 2014-09-09 LAB — TYPE AND SCREEN
ABO/RH(D): O POS
Antibody Screen: NEGATIVE
UNIT DIVISION: 0
Unit division: 0

## 2014-09-09 LAB — BASIC METABOLIC PANEL
ANION GAP: 7 (ref 5–15)
BUN: 11 mg/dL (ref 6–23)
CALCIUM: 8.5 mg/dL (ref 8.4–10.5)
CO2: 24 mmol/L (ref 19–32)
CREATININE: 0.68 mg/dL (ref 0.50–1.10)
Chloride: 108 mmol/L (ref 96–112)
GFR calc non Af Amer: >90 mL/min (ref >90)
Glucose, Bld: 106 mg/dL — ABNORMAL HIGH (ref 70–99)
Potassium: 3.8 mmol/L (ref 3.5–5.1)
SODIUM: 139 mmol/L (ref 135–145)

## 2014-09-09 LAB — CBC
HEMATOCRIT: 27.2 % — AB (ref 36.0–46.0)
Hemoglobin: 7.5 g/dL — ABNORMAL LOW (ref 12.0–15.0)
MCH: 17.9 pg — ABNORMAL LOW (ref 26.0–34.0)
MCHC: 27.6 g/dL — ABNORMAL LOW (ref 30.0–36.0)
MCV: 65.1 fL — AB (ref 78.0–100.0)
Platelets: 437 10*3/uL — ABNORMAL HIGH (ref 150–400)
RBC: 4.18 MIL/uL (ref 3.87–5.11)
RDW: 25.5 % — ABNORMAL HIGH (ref 11.5–15.5)
WBC: 12.9 10*3/uL — AB (ref 4.0–10.5)

## 2014-09-09 MED ORDER — SODIUM CHLORIDE 0.9 % IV SOLN
125.0000 mg | Freq: Once | INTRAVENOUS | Status: AC
  Administered 2014-09-09: 125 mg via INTRAVENOUS
  Filled 2014-09-09: qty 10

## 2014-09-09 MED ORDER — GABAPENTIN 100 MG PO CAPS: 100.0000 mg | ORAL_CAPSULE | Freq: Two times a day (BID) | ORAL | Status: DC

## 2014-09-09 MED ORDER — HYDROCODONE-ACETAMINOPHEN 5-325 MG PO TABS: 1.0000 | ORAL_TABLET | ORAL | Status: DC | PRN

## 2014-09-09 NOTE — Discharge Summary (Signed)
Physician Discharge Summary  Melinda [name] ZTI:[id number] DOB: 12/21/1965 DOA: 09/07/2014  PCP: No primary care provider on file.  Admit date: 09/07/2014 Discharge date: 09/09/2014   Recommendations for Outpatient Follow-Up:   1. OB-GYN follow up has been arranged.  Please check CBC on that visit.  Patient will also need repeat TSH in 6 weeks and a pelvic ultrasound in 6 weeks to re-evaluate ovarian mass. 2. Please F/U hemoglobin A1c, as patient has been hyperglycemic and this test is pending at discharge.  Discharge Diagnosis:   Principal Problem:    Symptomatic anemia secondary to menorrhagia from fibroids Active Problems:    History of heavy periods    Chronic blood loss anemia    Other chest pain    Bilateral edema of lower extremity    Dyspnea    Menorrhagia    Low TSH level    Hyperglycemia    Fibroids    2.5 cm right ovarian lesion    Discharge Condition: Improved.  Diet recommendation: Carbohydrate-modified.     History of Present Illness:   Melinda [name] is an 49 y.o. female with a PMH of migraine headaches and PTSD as well as asthma, GERD, ADHD, menorrhagia and peripheral neuropathy who was admitted 09/07/14 with chief complaint of dyspnea, fatigue, and severe migraine headache. Upon initial evaluation in the ED, the patient was found to have a hemoglobin of 6.  Hospital Course by Problem:   Principal Problem:  Symptomatic anemia/dyspnea/chest pain in the setting of menorrhagia / iron deficiency anemia secondary to chronic blood loss - S/P 2 units of packed red blood cells, hemoglobin now 7.5. - Anemia panel consistent with iron deficiency. Fecal occult blood negative. IV iron replacement given prior to discharge. - Chest x-ray negative for pulmonary edema or other acute cardiopulmonary process.  Active Problems:  Asthma - No attacks since 2005.   Pelvic/Back Pain in the setting of menorrhagia/Uterine fibroids - Likely from uterine cramps. Vicodin  ordered.  Avoid NSAIDS due to risk for worsening bleeding. - Transvaginal ultrasound showed 2 large fibroids and a 2.5 cm right ovarian lesion. - Outpatient referral to OB-GYN for treatment of fibroids and F/U of ovarian lesion.   Hyperglycemia - Hemoglobin A1c pending. +FH of DM. - Dietician to counsel patient prior to D/C.   Bilateral edema of lower extremity - Reports acute onset. Albumin WNL. - ? Related to elevated BP. - BNP 192.8. - S/P lasix x 1.   Low TSH - Likely sick euthyroid syndrome. Repeat TSH in 6 weeks.   Medical Consultants:    None.   Discharge Exam:   Filed Vitals:   09/09/14 0547  BP: 124/58  Pulse: 84  Temp: 98.4 F (36.9 C)  Resp: 18   Filed Vitals:   09/08/14 1129 09/08/14 1756 09/08/14 2108 09/09/14 0547  BP: 153/66 155/67 137/58 124/58  Pulse: 100 98 91 84  Temp: 98.6 F (37 C)  98.6 F (37 C) 98.4 F (36.9 C)  TempSrc:   Oral Oral  Resp: 18  16 18   Height:      Weight:      SpO2: 100%  100% 100%    Gen:  NAD Cardiovascular:  RRR, No M/R/G Respiratory: Lungs CTAB Gastrointestinal: Abdomen soft, NT/ND with normal active bowel sounds. Extremities: No C/E/C   The results of significant diagnostics from this hospitalization (including imaging, microbiology, ancillary and laboratory) are listed below for reference.     Procedures and Diagnostic Studies:   X-ray Chest Pa And Lateral  09/08/2014: No active cardiopulmonary disease.     US Pelvis / Transvaginal Non-ob 09/08/2014: 1. Two large fibroids. The endometrium is distorted by the fibroids, and it is unclear whether 1 of the fibroids is intramural or submucosal. 2. 2.5 cm thick-walled lesion in the right ovary as above. This may represent a corpus luteum, but a follow-up ultrasound is recommended in 6 weeks to assess for resolution. 3. Nonvisualization of the left ovary.   Electronically Signed   By: Logan Bores   On: 09/08/2014 16:04   Labs:   Basic Metabolic  Panel:  Recent Labs Lab 09/07/14 2025 09/08/14 0147 09/09/14 0510  NA 137 134* 139  K 3.8 3.7 3.8  CL 106 105 108  CO2 26 26 24   GLUCOSE 87 185* 106*  BUN 10 10 11   CREATININE 0.64 0.75 0.68  CALCIUM 8.8 8.5 8.5  MG  --  1.8  --   PHOS  --  3.8  --    GFR Estimated Creatinine Clearance: 128 mL/min (by C-G formula based on Cr of 0.68). Liver Function Tests:  Recent Labs Lab 09/07/14 2025 09/08/14 0147  AST 12 14  ALT 10 11  ALKPHOS 73 73  BILITOT 0.4 0.5  PROT 6.9 7.2  ALBUMIN 3.7 4.0   CBC:  Recent Labs Lab 09/07/14 2025 09/08/14 0147 09/08/14 1345 09/09/14 0510  WBC 11.2* 11.4*  --  12.9*  NEUTROABS 6.8  --   --   --   HGB 6.0* 6.3* 7.7* 7.5*  HCT 23.7* 25.2* 28.4* 27.2*  MCV 61.6* 61.5*  --  65.1*  PLT 547* 496*  --  437*   D-Dimer  Recent Labs  09/07/14 2025  DDIMER <0.27   Hgb A1c No results for input(s): HGBA1C in the last 72 hours. Lipid Profile  Recent Labs  09/08/14 0147  CHOL 159  HDL 58  LDLCALC 83  TRIG 89  CHOLHDL 2.7   Thyroid function studies  Recent Labs  09/08/14 0147  TSH 0.324*   Anemia work up  Recent Labs  09/07/14 2324  VITAMINB12 403  FOLATE 18.8  FERRITIN 4*  TIBC Not calculated due to Iron <10.  IRON <10*  RETICCTPCT 3.4*   Microbiology No results found for this or any previous visit (from the past 240 hour(s)).   Discharge Instructions:   Discharge Instructions    Call MD for:  extreme fatigue    Complete by:  As directed      Call MD for:  persistant dizziness or light-headedness    Complete by:  As directed      Call MD for:    Complete by:  As directed   Excessive vaginal bleeding.     Diet Carb Modified    Complete by:  As directed      Discharge instructions    Complete by:  As directed   You were cared for by Dr. Jacquelynn Cree  (a hospitalist) during your hospital stay. If you have any questions about your discharge medications or the care you received while you were in the hospital  after you are discharged, you can call the unit and ask to speak with the hospitalist on call if the hospitalist that took care of you is not available. Once you are discharged, your primary care physician will handle any further medical issues. Please note that NO REFILLS for any discharge medications will be authorized once you are discharged, as it is imperative that you return to your primary care  physician (or establish a relationship with a primary care physician if you do not have one) for your aftercare needs so that they can reassess your need for medications and monitor your lab values.  Any outstanding tests can be reviewed by your PCP at your follow up visit.  It is also important to review any medicine changes with your PCP.  Please bring these d/c instructions with you to your next visit so your physician can review these changes with you.  If you do not have a primary care physician, you can call [phone] for a physician referral.  It is highly recommended that you obtain a PCP for hospital follow up.     Increase activity slowly    Complete by:  As directed             Medication List    STOP taking these medications        naproxen sodium 220 MG tablet  Commonly known as:  ANAPROX      TAKE these medications        diphenhydrAMINE 25 MG tablet  Commonly known as:  BENADRYL  Take 50 mg by mouth at bedtime as needed for sleep (sleep).     gabapentin 100 MG capsule  Commonly known as:  NEURONTIN  Take 1 capsule (100 mg total) by mouth 2 (two) times daily.     HYDROcodone-acetaminophen 5-325 MG per tablet  Commonly known as:  NORCO/VICODIN  Take 1-2 tablets by mouth every 4 (four) hours as needed for moderate pain.     MULTIVITAMIN & MINERAL PO  Take 1 tablet by mouth daily.          Time coordinating discharge: 35 minutes.  Signed:  RAMA,CHRISTINA  Pager [phone] [facility] 09/09/2014, 9:57 AM

## 2014-09-09 NOTE — Discharge Instructions (Signed)
Abnormal Uterine Bleeding Abnormal uterine bleeding can affect women at various stages in life, including teenagers, women in their reproductive years, pregnant women, and women who have reached menopause. Several kinds of uterine bleeding are considered abnormal, including:  Bleeding or spotting between periods.   Bleeding after sexual intercourse.   Bleeding that is heavier or more than normal.   Periods that last longer than usual.  Bleeding after menopause.  Many cases of abnormal uterine bleeding are minor and simple to treat, while others are more serious. Any type of abnormal bleeding should be evaluated by your health care provider. Treatment will depend on the cause of the bleeding. HOME CARE INSTRUCTIONS Monitor your condition for any changes. The following actions may help to alleviate any discomfort you are experiencing:  Avoid the use of tampons and douches as directed by your health care provider.  Change your pads frequently. You should get regular pelvic exams and Pap tests. Keep all follow-up appointments for diagnostic tests as directed by your health care provider.  SEEK MEDICAL CARE IF:   Your bleeding lasts more than 1 week.   You feel dizzy at times.  SEEK IMMEDIATE MEDICAL CARE IF:   You pass out.   You are changing pads every 15 to 30 minutes.   You have abdominal pain.  You have a fever.   You become sweaty or weak.   You are passing large blood clots from the vagina.   You start to feel nauseous and vomit. MAKE SURE YOU:   Understand these instructions.  Will watch your condition.  Will get help right away if you are not doing well or get worse. Document Released: 06/17/2005 Document Revised: 06/22/2013 Document Reviewed: 01/14/2013 ExitCare Patient Information 2015 [employer]. This information is not intended to replace advice given to you by your health care provider. Make sure you discuss any questions you have with your  health care provider.  

## 2014-09-09 NOTE — Plan of Care (Signed)
Problem: Food- and Nutrition-Related Knowledge Deficit (NB-1.1) Goal: Nutrition education Formal process to instruct or train a patient/client in a skill or to impart knowledge to help patients/clients voluntarily manage or modify food choices and eating behavior to maintain or improve health. Outcome: Completed/Met Date Met:  09/09/14 RD consulted for diet education regarding prediabetes and rich-iron food sources.  Pt familiar with diabetes, and CHO sources. She enjoys cooking, and is interested in integrating healthy foods with food preparation. She also drinks water, and does not like sweetened beverages.  RD provided "MyPlate Healthy Eating" handout from the USDA. Discussed different food groups and their effects on blood sugar, emphasizing CHO containing foods. Provided list of different foods in each food group, and recommended serving sizes.  Discussed different foods containing iron, and the difference between animal-based protein and plant-based protein. Teach back method used.  Expect good compliance.  Body mass index is 41.1 kg/(m^2). Pt meets criteria for Obesity Class III based on current BMI.  Current diet order is Heart Healthy. Labs and medications reviewed. No further nutrition interventions warranted at this time. If additional nutrition issues arise, please consult RD.  Melinda Grant, MS Dietetic Intern Pager: [phone]        

## 2014-09-09 NOTE — Progress Notes (Signed)
Spoke with pt concerning no PCP, encouraged pt to go to [facility], which is now walk in. Information given to pt via phone and placed on discharge/follow up sheet.  Pt states, "I will go if I have a ride."  Informed pt that there is a bus line to [facility].

## 2014-10-14 ENCOUNTER — Ambulatory Visit: Payer: Medicaid Other | Attending: Family Medicine | Admitting: Family Medicine

## 2014-10-14 ENCOUNTER — Encounter: Payer: Self-pay | Admitting: Family Medicine

## 2014-10-14 VITALS — BP 128/82 | HR 90 | Temp 98.3°F | Resp 18 | Ht 69.0 in | Wt 279.0 lb

## 2014-10-14 DIAGNOSIS — R946 Abnormal results of thyroid function studies: Secondary | ICD-10-CM

## 2014-10-14 DIAGNOSIS — N839 Noninflammatory disorder of ovary, fallopian tube and broad ligament, unspecified: Secondary | ICD-10-CM

## 2014-10-14 DIAGNOSIS — G44059 Short lasting unilateral neuralgiform headache with conjunctival injection and tearing (SUNCT), not intractable: Secondary | ICD-10-CM

## 2014-10-14 DIAGNOSIS — D259 Leiomyoma of uterus, unspecified: Secondary | ICD-10-CM

## 2014-10-14 DIAGNOSIS — D649 Anemia, unspecified: Secondary | ICD-10-CM | POA: Insufficient documentation

## 2014-10-14 DIAGNOSIS — R51 Headache: Secondary | ICD-10-CM | POA: Diagnosis present

## 2014-10-14 DIAGNOSIS — R7989 Other specified abnormal findings of blood chemistry: Secondary | ICD-10-CM

## 2014-10-14 DIAGNOSIS — G43909 Migraine, unspecified, not intractable, without status migrainosus: Secondary | ICD-10-CM | POA: Insufficient documentation

## 2014-10-14 DIAGNOSIS — N838 Other noninflammatory disorders of ovary, fallopian tube and broad ligament: Secondary | ICD-10-CM

## 2014-10-14 DIAGNOSIS — D500 Iron deficiency anemia secondary to blood loss (chronic): Secondary | ICD-10-CM | POA: Diagnosis not present

## 2014-10-14 DIAGNOSIS — Z720 Tobacco use: Secondary | ICD-10-CM | POA: Diagnosis not present

## 2014-10-14 DIAGNOSIS — F17200 Nicotine dependence, unspecified, uncomplicated: Secondary | ICD-10-CM

## 2014-10-14 LAB — TSH: TSH: 0.21 u[IU]/mL — AB (ref 0.350–4.500)

## 2014-10-14 LAB — T4, FREE: FREE T4: 0.94 ng/dL (ref 0.80–1.80)

## 2014-10-14 LAB — T3, FREE: T3 FREE: 3.7 pg/mL (ref 2.3–4.2)

## 2014-10-14 MED ORDER — BUTALBITAL-APAP-CAFF-COD 50-325-40-30 MG PO CAPS: 1.0000 | ORAL_CAPSULE | ORAL | Status: DC | PRN

## 2014-10-14 NOTE — Progress Notes (Signed)
Hospital follow up. Patient hospitalized 3/9-3/11, for migraine, blood work revealed anemia. Patient received 2 pints of blood during hospital stay. Feet were also swollen during that time. Swelling in feet has went down, patient reports feeling some better. Patient reports headache, currently, "not my typically migraine", "it's taking over the entire right side of my face and neck", for 1 week, described as "comes in waves", started out stabbing, currently pressure. Pain rated at 9.

## 2014-10-14 NOTE — Patient Instructions (Signed)
Cluster Headache Cluster headaches are recognized by their pattern of deep, intense head pain. They normally occur on one side of your head, but they may "switch sides" in subsequent episodes. Typically, cluster headaches:   Are severe in nature.   Occur repeatedly over weeks to months and are followed by periods of no headaches.   Can last from 15 minutes to 3 hours.   Occur at the same time each day, often at night.   Occur several times a day. CAUSES The exact cause of cluster headaches is not known. Alcohol use may be associated with cluster headaches. SIGNS AND SYMPTOMS   Severe pain that begins in or around your eye or temple.   One-sided head pain.   Feeling sick to your stomach (nauseous).   Sensitivity to light.   Runny nose.   Eye redness, tearing, and nasal stuffiness on the side of your head where you are experiencing pain.   Sweaty, pale skin of the face.   Droopy or swollen eyelid.   Restlessness. DIAGNOSIS  Cluster headaches are diagnosed based on symptoms and a physical exam. Your health care provider may order a CT scan or an MRI of your head or lab tests to see if your headaches are caused by other medical conditions.  TREATMENT   Medicines for pain relief and to prevent recurrent attacks. Some people may need a combination of medicines.  Oxygen for pain relief.   Biofeedback programs to help reduce headache pain.  It may be helpful to keep a headache diary. This may help you find a trend for what is triggering your headaches. Your health care provider can develop a treatment plan.  HOME CARE INSTRUCTIONS  During cluster periods:   Follow a regular sleep schedule. Do not vary the amount and time that you sleep from day to day. It is important to stay on the same schedule during a cluster period to help prevent headaches.   Avoid alcohol.   Stop smoking if you smoke.  SEEK MEDICAL CARE IF:  You have any changes from your previous  cluster headaches either in intensity or frequency.   You are not getting relief from medicines you are taking.  SEEK IMMEDIATE MEDICAL CARE IF:   You faint.   You have weakness or numbness, especially on one side of your body or face.   You have double vision.   You have nausea or vomiting that is not relieved within several hours.   You cannot keep your balance or have difficulty talking or walking.   You have neck pain or stiffness.   You have a fever. MAKE SURE YOU:  Understand these instructions.   Will watch your condition.   Will get help right away if you are not doing well or get worse. Document Released: 06/17/2005 Document Revised: 04/07/2013 Document Reviewed: 01/07/2013 ExitCare Patient Information 2015 [employer]. This information is not intended to replace advice given to you by your health care provider. Make sure you discuss any questions you have with your health care provider.  

## 2014-10-14 NOTE — Progress Notes (Signed)
Subjective:    Patient ID: Melinda [name], female    DOB: [birth year], 49 y.o.   MRN: [id number]  HPI  Melinda [name] is here for hospital follow-up after a hospitalization at [facility] between 09/07/14 and 09/09/14 after she had presented with dyspnea, fatigue, and migraine headaches and initial evaluation in the ED revealed a hemoglobin of 6, BNP was 192.8. She had a TSH done which came back at suppressed level of 0.324  A transvaginal ultrasound revealed 2 large fibroids in 2.5 cm right ovarian lesion and she had admitted to menorrhagia. She received 2 units of packed red blood cells with subsequent improvement in hemoglobin to 7.5. A dose of Lasix was also given in the emergency room. She was advised to follow-up with GYN for further evaluation.  Interval history: She states she was supposed to see Dr. Gracy Racer but has been unable to do so just to some problems with insurance. Today she complains of right sided headache which comes in waves and does not come with the auras which are typical of her usual migraines and pain radiates to her right jaw when she chews and also scalp is sore on the right side; she does have some tearing but no nausea or vomiting or dizziness. Admits to blurry vision  Review of Systems  General: negative for fever, weight loss, appetite change Eyes: see HPI ENT: no ear symptoms, no sinus tenderness, no nasal congestion or sore throat. Neck: no pain  Respiratory: no wheezing, shortness of breath, cough Cardiovascular: no chest pain, no dyspnea on exertion, no pedal edema, no orthopnea. Gastrointestinal: no abdominal pain, no diarrhea, no constipation Genito-Urinary: has menorrhagia Hematologic: no bruising Endocrine: no cold or heat intolerance Neurological: see HPI Musculoskeletal: admits to back pain Skin: no pruritus, no rash. Psychological: no depression, no anxiety,       Objective: Filed Vitals:   10/14/14 1041  BP: 128/82  Pulse: 90  Temp:  98.3 F (36.8 C)  Resp: 18      Physical Exam  Constitutional: obese Eyes: PERRLA HENT: Head is atraumatic, normal sinuses, normal oropharynx, normal appearing tonsils and palate Neck: normal range of motion, no thyromegaly, no JVD cardiovascular: normal rate and rhythm, normal heart sounds, no murmurs, rub or gallop, no pedal edema Respiratory: clear to auscultation bilaterally, no wheezes, no rales, no rhonchi Abdomen: soft, not tender to palpation, normal bowel sounds, no enlarged organs Extremities: Full ROM, no tenderness in joints Skin: warm and dry, no lesions. Neurological: alert, oriented x3, cranial nerves I-XII grossly intact, muscle strength normal in all muscle groups Psychological: normal mood.   CBC Latest Ref Rng 09/09/2014 09/08/2014 09/08/2014  WBC 4.0 - 10.5 K/uL 12.9(H) - 11.4(H)  Hemoglobin 12.0 - 15.0 g/dL 7.5(L) 7.7(L) 6.3(LL)  Hematocrit 36.0 - 46.0 % 27.2(L) 28.4(L) 25.2(L)  Platelets 150 - 400 K/uL 437(H) - 496(H)     Lab Results  Component Value Date   TSH 0.324* 09/08/2014        Assessment & Plan:  49 year old patient with a history of migraines presenting with right-sided headaches which is atypical for her usual migraines, history of anemia also secondary to menorrhagia from fibroids.  Headache:  I will need to exclude temporal arteritis and so I am sending off a sedimentation rate and will place on prednisone in the event that it comes back elevated and I will consider a head CT in the event that is negative. Fioricet when necessary for for pain  Anemia: This is secondary  to menorrhagia from fibroids and so I am sending off a CBC  Fibroids: We have gotten in touch with her GYN to clarify the issues and our NPI given; and an appointment scheduled      Suppressed TSH: She currently doesn't have a diagnosis of hyperparathyroidism for which I have ordered a thyroid panel today and if TSH remains suppressed I will place her on methimazole.                             Tobacco abuse: Smoking cessation support: smoking cessation hotline: 1-800-QUIT-NOW.  Smoking cessation classes are available through [facility]. Call [phone] or visit our website at [email or web address]  Spent 3 minutes counseling on smoking cessation and patient is not ready to quit.

## 2014-10-15 LAB — CBC WITH DIFFERENTIAL/PLATELET
BASOS PCT: 0 % (ref 0–1)
Basophils Absolute: 0 10*3/uL (ref 0.0–0.1)
Eosinophils Absolute: 0.2 10*3/uL (ref 0.0–0.7)
Eosinophils Relative: 2 % (ref 0–5)
HCT: 31.3 % — ABNORMAL LOW (ref 36.0–46.0)
Hemoglobin: 8.8 g/dL — ABNORMAL LOW (ref 12.0–15.0)
LYMPHS PCT: 23 % (ref 12–46)
Lymphs Abs: 2.1 10*3/uL (ref 0.7–4.0)
MCH: 18.1 pg — ABNORMAL LOW (ref 26.0–34.0)
MCHC: 28.1 g/dL — ABNORMAL LOW (ref 30.0–36.0)
MCV: 64.3 fL — AB (ref 78.0–100.0)
MONO ABS: 0.6 10*3/uL (ref 0.1–1.0)
MPV: 9 fL (ref 8.6–12.4)
Monocytes Relative: 7 % (ref 3–12)
Neutro Abs: 6.2 10*3/uL (ref 1.7–7.7)
Neutrophils Relative %: 68 % (ref 43–77)
PLATELETS: 460 10*3/uL — AB (ref 150–400)
RBC: 4.87 MIL/uL (ref 3.87–5.11)
RDW: 22.8 % — ABNORMAL HIGH (ref 11.5–15.5)
WBC: 9.1 10*3/uL (ref 4.0–10.5)

## 2014-10-15 LAB — SEDIMENTATION RATE: SED RATE: 4 mm/h (ref 0–20)

## 2014-10-18 ENCOUNTER — Telehealth: Payer: Self-pay

## 2014-10-18 ENCOUNTER — Other Ambulatory Visit: Payer: Self-pay | Admitting: Family Medicine

## 2014-10-18 MED ORDER — FERROUS SULFATE 325 (65 FE) MG PO TABS: 325.0000 mg | ORAL_TABLET | Freq: Two times a day (BID) | ORAL | Status: DC

## 2014-10-18 MED ORDER — METHIMAZOLE 5 MG PO TABS: 5.0000 mg | ORAL_TABLET | Freq: Two times a day (BID) | ORAL | Status: DC

## 2014-10-18 NOTE — Telephone Encounter (Signed)
-----   Message from Arnoldo Morale, MD sent at 10/18/2014 12:37 AM EDT ----- Labs reveal Hyperthyroidism and her hemoglobin has improved slightly but she is still anemic and so I have sent prescriptions for thyroid medications and ferrous sulfate to the pharmacy on file.

## 2014-10-18 NOTE — Telephone Encounter (Signed)
Nurse called patient, patient verified date of birth. Nurse reported lab results of hyperthyroidism and anemia. Patient aware of medications at pharmacy. Patient explains prescriptions can't be picked up until 10/28/14 because patient does not have a ride. Patient will be back in clinic on 10/28/14 for appt with doctor and will pick up prescriptions then.

## 2014-10-18 NOTE — Telephone Encounter (Signed)
Dr. Jarold Song aware of patients inability to pickup prescriptions until 10/28/14.

## 2014-10-20 ENCOUNTER — Other Ambulatory Visit: Payer: Self-pay | Admitting: Family Medicine

## 2014-10-28 ENCOUNTER — Encounter: Payer: Self-pay | Admitting: Internal Medicine

## 2014-10-28 ENCOUNTER — Ambulatory Visit: Payer: Medicaid Other | Attending: Internal Medicine | Admitting: Internal Medicine

## 2014-10-28 VITALS — BP 139/87 | HR 78 | Temp 98.0°F | Resp 16 | Wt 279.8 lb

## 2014-10-28 DIAGNOSIS — R946 Abnormal results of thyroid function studies: Secondary | ICD-10-CM

## 2014-10-28 DIAGNOSIS — R51 Headache: Secondary | ICD-10-CM | POA: Diagnosis not present

## 2014-10-28 DIAGNOSIS — R7301 Impaired fasting glucose: Secondary | ICD-10-CM | POA: Diagnosis not present

## 2014-10-28 DIAGNOSIS — Z23 Encounter for immunization: Secondary | ICD-10-CM

## 2014-10-28 DIAGNOSIS — R7989 Other specified abnormal findings of blood chemistry: Secondary | ICD-10-CM

## 2014-10-28 DIAGNOSIS — Z9049 Acquired absence of other specified parts of digestive tract: Secondary | ICD-10-CM | POA: Insufficient documentation

## 2014-10-28 DIAGNOSIS — D500 Iron deficiency anemia secondary to blood loss (chronic): Secondary | ICD-10-CM

## 2014-10-28 DIAGNOSIS — G629 Polyneuropathy, unspecified: Secondary | ICD-10-CM | POA: Insufficient documentation

## 2014-10-28 DIAGNOSIS — F17210 Nicotine dependence, cigarettes, uncomplicated: Secondary | ICD-10-CM | POA: Diagnosis not present

## 2014-10-28 DIAGNOSIS — R519 Headache, unspecified: Secondary | ICD-10-CM

## 2014-10-28 DIAGNOSIS — Z1231 Encounter for screening mammogram for malignant neoplasm of breast: Secondary | ICD-10-CM

## 2014-10-28 DIAGNOSIS — D259 Leiomyoma of uterus, unspecified: Secondary | ICD-10-CM

## 2014-10-28 MED ORDER — SUMATRIPTAN SUCCINATE 50 MG PO TABS: 50.0000 mg | ORAL_TABLET | ORAL | Status: DC | PRN

## 2014-10-28 NOTE — Progress Notes (Signed)
Patient here for follow up on her headaches Patient states the medication helps but still having headaches

## 2014-10-28 NOTE — Progress Notes (Signed)
MRN: [id number] Name: Melinda [name]  Sex: female Age: 49 y.o. DOB: 25-Apr-1966  Allergies: Penicillins; Rocephin; Shellfish allergy; and Citrus  Chief Complaint  Patient presents with  . Follow-up    HPI: Patient is 49 y.o. female who was last month hospitalized with the symptoms of fatigue, dyspnea, EMR reviewed her patient had heavy menstrual bleeding and found to have fibroids, she subsequently followed up with transitional care clinic with Dr. Jarold Song has been prescribed iron supplement as per patient she has not started taking yet, she already has scheduled appointment with GYN, she also reported to have history of migraine headaches and have used Imitrex in the past, recently she was prescribed Fioricet which she ran out,also blood work reviewed with the patient noticed suppressed TSH level but her free T4 and T3 levels are normal, clinically patient does not have symptoms of hyperthyroidism.also noted her blood work shows impaired fasting glucose, as per patient she is trying to cut down carbs.  Past Medical History  Diagnosis Date  . Migraine   . PTSD (post-traumatic stress disorder)   . Asthma   . GERD (gastroesophageal reflux disease)   . Scoliosis   . Arthritis   . ADHD (attention deficit hyperactivity disorder)   . Peripheral neuropathy   . Iron deficiency anemia due to chronic blood loss 09/09/2014  . Hyperglycemia 09/09/2014  . Low TSH level 09/08/2014  . Menorrhagia 09/08/2014  . Ovarian mass, right 09/09/2014    Past Surgical History  Procedure Laterality Date  . Cholecystectomy        Medication List       This list is accurate as of: 10/28/14 12:28 PM.  Always use your most recent med list.               acetaminophen 500 MG tablet  Commonly known as:  TYLENOL  Take 500 mg by mouth every 6 (six) hours as needed (patient takes 1-2 depending on pain level).     butalbital-acetaminophen-caffeine 50-325-40-30 MG per capsule  Commonly known as:   FIORICET/CODEINE  Take 1 capsule by mouth every 4 (four) hours as needed for headache.     diphenhydrAMINE 25 MG tablet  Commonly known as:  BENADRYL  Take 50 mg by mouth at bedtime as needed for sleep (sleep).     ferrous sulfate 325 (65 FE) MG tablet  Take 1 tablet (325 mg total) by mouth 2 (two) times daily with a meal.     gabapentin 100 MG capsule  Commonly known as:  NEURONTIN  Take 1 capsule (100 mg total) by mouth 2 (two) times daily.     HYDROcodone-acetaminophen 5-325 MG per tablet  Commonly known as:  NORCO/VICODIN  Take 1-2 tablets by mouth every 4 (four) hours as needed for moderate pain.     MULTIVITAMIN & MINERAL PO  Take 1 tablet by mouth daily.     SUMAtriptan 50 MG tablet  Commonly known as:  IMITREX  Take 1 tablet (50 mg total) by mouth every 2 (two) hours as needed for migraine or headache. May repeat in 2 hours if headache persists or recurs.        Meds ordered this encounter  Medications  . SUMAtriptan (IMITREX) 50 MG tablet    Sig: Take 1 tablet (50 mg total) by mouth every 2 (two) hours as needed for migraine or headache. May repeat in 2 hours if headache persists or recurs.    Dispense:  10 tablet    Refill:  0    Immunization History  Administered Date(s) Administered  . Influenza,inj,Quad PF,36+ Mos 09/09/2014  . Pneumococcal Polysaccharide-23 09/09/2014  . Tdap 10/28/2014    Family History  Problem Relation Age of Onset  . Diabetes Mother   . Breast cancer Mother   . CAD Mother   . Hypertension Mother   . Alcohol abuse Father   . Hypertension Father     History  Substance Use Topics  . Smoking status: Current Some Day Smoker  . Smokeless tobacco: Not on file  . Alcohol Use: No    Review of Systems   As noted in HPI  Filed Vitals:   10/28/14 0951  BP: 139/87  Pulse: 78  Temp: 98 F (36.7 C)  Resp: 16    Physical Exam  Physical Exam  Constitutional: No distress.  Eyes: EOM are normal. Pupils are equal, round, and  reactive to light.  Cardiovascular: Normal rate and regular rhythm.   Pulmonary/Chest: Breath sounds normal. No respiratory distress. She has no wheezes. She has no rales.  Musculoskeletal: She exhibits no edema.    CBC    Component Value Date/Time   WBC 9.1 10/14/2014 1144   RBC 4.87 10/14/2014 1144   RBC 4.00 09/07/2014 2324   HGB 8.8* 10/14/2014 1144   HCT 31.3* 10/14/2014 1144   PLT 460* 10/14/2014 1144   MCV 64.3* 10/14/2014 1144   LYMPHSABS 2.1 10/14/2014 1144   MONOABS 0.6 10/14/2014 1144   EOSABS 0.2 10/14/2014 1144   BASOSABS 0.0 10/14/2014 1144    CMP     Component Value Date/Time   NA 139 09/09/2014 0510   K 3.8 09/09/2014 0510   CL 108 09/09/2014 0510   CO2 24 09/09/2014 0510   GLUCOSE 106* 09/09/2014 0510   BUN 11 09/09/2014 0510   CREATININE 0.68 09/09/2014 0510   CALCIUM 8.5 09/09/2014 0510   PROT 7.2 09/08/2014 0147   ALBUMIN 4.0 09/08/2014 0147   AST 14 09/08/2014 0147   ALT 11 09/08/2014 0147   ALKPHOS 73 09/08/2014 0147   BILITOT 0.5 09/08/2014 0147   GFRNONAA >90 09/09/2014 0510   GFRAA >90 09/09/2014 0510    Lab Results  Component Value Date/Time   CHOL 159 09/08/2014 01:47 AM    Lab Results  Component Value Date/Time   HGBA1C 5.7* 09/08/2014 01:47 AM    Lab Results  Component Value Date/Time   AST 14 09/08/2014 01:47 AM    Assessment and Plan  Uterine leiomyoma, unspecified location Patient is scheduled to follow with you and  Iron deficiency anemia due to chronic blood loss Advised patient to take iron supplement 3 times a day  Headache, unspecified headache type - Plan: Patient has history of migraine headaches, prescribed SUMAtriptan (IMITREX) 50 MG tablet  IFG (impaired fasting glucose) Advise patient for low carbohydrate diet  Need for Tdap vaccination - Plan: Tdap vaccine greater than or equal to 7yo IM  Encounter for screening mammogram for breast cancer - Plan: MM DIGITAL SCREENING BILATERAL  Low TSH level Her  free T3 and T4 level are normal, patient is clinically euthyroid, patient has not taking Tapazole,will repeat her full TFT panel on the following visit.  Health Maintenance  -Pap Smear:scheduled to follow with GYN -Mammogram:Ordered   Return in about 3 months (around 01/27/2015).   This note has been created with Surveyor, quantity. Any transcriptional errors are unintentional.    Lorayne Marek, MD

## 2014-10-28 NOTE — Patient Instructions (Signed)
Diabetes Mellitus and Food It is important for you to manage your blood sugar (glucose) level. Your blood glucose level can be greatly affected by what you eat. Eating healthier foods in the appropriate amounts throughout the day at about the same time each day will help you control your blood glucose level. It can also help slow or prevent worsening of your diabetes mellitus. Healthy eating may even help you improve the level of your blood pressure and reach or maintain a healthy weight.  HOW CAN FOOD AFFECT ME? Carbohydrates Carbohydrates affect your blood glucose level more than any other type of food. Your dietitian will help you determine how many carbohydrates to eat at each meal and teach you how to count carbohydrates. Counting carbohydrates is important to keep your blood glucose at a healthy level, especially if you are using insulin or taking certain medicines for diabetes mellitus. Alcohol Alcohol can cause sudden decreases in blood glucose (hypoglycemia), especially if you use insulin or take certain medicines for diabetes mellitus. Hypoglycemia can be a life-threatening condition. Symptoms of hypoglycemia (sleepiness, dizziness, and disorientation) are similar to symptoms of having too much alcohol.  If your health care provider has given you approval to drink alcohol, do so in moderation and use the following guidelines:  Women should not have more than one drink per day, and men should not have more than two drinks per day. One drink is equal to:  12 oz of beer.  5 oz of wine.  1 oz of hard liquor.  Do not drink on an empty stomach.  Keep yourself hydrated. Have water, diet soda, or unsweetened iced tea.  Regular soda, juice, and other mixers might contain a lot of carbohydrates and should be counted. WHAT FOODS ARE NOT RECOMMENDED? As you make food choices, it is important to remember that all foods are not the same. Some foods have fewer nutrients per serving than other  foods, even though they might have the same number of calories or carbohydrates. It is difficult to get your body what it needs when you eat foods with fewer nutrients. Examples of foods that you should avoid that are high in calories and carbohydrates but low in nutrients include:  Trans fats (most processed foods list trans fats on the Nutrition Facts label).  Regular soda.  Juice.  Candy.  Sweets, such as cake, pie, doughnuts, and cookies.  Fried foods. WHAT FOODS CAN I EAT? Have nutrient-rich foods, which will nourish your body and keep you healthy. The food you should eat also will depend on several factors, including:  The calories you need.  The medicines you take.  Your weight.  Your blood glucose level.  Your blood pressure level.  Your cholesterol level. You also should eat a variety of foods, including:  Protein, such as meat, poultry, fish, tofu, nuts, and seeds (lean animal proteins are best).  Fruits.  Vegetables.  Dairy products, such as milk, cheese, and yogurt (low fat is best).  Breads, grains, pasta, cereal, rice, and beans.  Fats such as olive oil, trans fat-free margarine, canola oil, avocado, and olives. DOES EVERYONE WITH DIABETES MELLITUS HAVE THE SAME MEAL PLAN? Because every person with diabetes mellitus is different, there is not one meal plan that works for everyone. It is very important that you meet with a dietitian who will help you create a meal plan that is just right for you. Document Released: 03/14/2005 Document Revised: 06/22/2013 Document Reviewed: 05/14/2013 Exit[employer] Patient Information 2015 [employer]. This   information is not intended to replace advice given to you by your health care provider. Make sure you discuss any questions you have with your health care provider.  

## 2014-11-08 ENCOUNTER — Inpatient Hospital Stay (HOSPITAL_COMMUNITY): Payer: Medicaid Other

## 2014-11-08 ENCOUNTER — Encounter (HOSPITAL_COMMUNITY): Payer: Self-pay

## 2014-11-08 ENCOUNTER — Inpatient Hospital Stay (HOSPITAL_COMMUNITY)
Admission: EM | Admit: 2014-11-08 | Discharge: 2014-11-11 | DRG: 176 | Disposition: A | Discharge status: Home / Self Care | Payer: Medicaid Other | Attending: Internal Medicine | Admitting: Internal Medicine

## 2014-11-08 ENCOUNTER — Ambulatory Visit (HOSPITAL_COMMUNITY)
Admit: 2014-11-08 | Discharge: 2014-11-08 | Disposition: A | Discharge status: Home / Self Care | Payer: Medicaid Other | Attending: Internal Medicine | Admitting: Internal Medicine

## 2014-11-08 ENCOUNTER — Emergency Department (HOSPITAL_COMMUNITY): Payer: Medicaid Other

## 2014-11-08 DIAGNOSIS — Z6841 Body Mass Index (BMI) 40.0 and over, adult: Secondary | ICD-10-CM | POA: Diagnosis not present

## 2014-11-08 DIAGNOSIS — M419 Scoliosis, unspecified: Secondary | ICD-10-CM | POA: Diagnosis present

## 2014-11-08 DIAGNOSIS — R000 Tachycardia, unspecified: Secondary | ICD-10-CM | POA: Diagnosis present

## 2014-11-08 DIAGNOSIS — I2699 Other pulmonary embolism without acute cor pulmonale: Secondary | ICD-10-CM | POA: Diagnosis present

## 2014-11-08 DIAGNOSIS — D500 Iron deficiency anemia secondary to blood loss (chronic): Secondary | ICD-10-CM | POA: Diagnosis present

## 2014-11-08 DIAGNOSIS — F4310 Post-traumatic stress disorder, unspecified: Secondary | ICD-10-CM | POA: Diagnosis present

## 2014-11-08 DIAGNOSIS — F17210 Nicotine dependence, cigarettes, uncomplicated: Secondary | ICD-10-CM | POA: Diagnosis present

## 2014-11-08 DIAGNOSIS — G43909 Migraine, unspecified, not intractable, without status migrainosus: Secondary | ICD-10-CM | POA: Diagnosis present

## 2014-11-08 DIAGNOSIS — N839 Noninflammatory disorder of ovary, fallopian tube and broad ligament, unspecified: Secondary | ICD-10-CM | POA: Diagnosis present

## 2014-11-08 DIAGNOSIS — N921 Excessive and frequent menstruation with irregular cycle: Secondary | ICD-10-CM | POA: Diagnosis not present

## 2014-11-08 DIAGNOSIS — N390 Urinary tract infection, site not specified: Secondary | ICD-10-CM | POA: Diagnosis present

## 2014-11-08 DIAGNOSIS — K219 Gastro-esophageal reflux disease without esophagitis: Secondary | ICD-10-CM | POA: Diagnosis present

## 2014-11-08 DIAGNOSIS — I471 Supraventricular tachycardia: Secondary | ICD-10-CM

## 2014-11-08 DIAGNOSIS — R079 Chest pain, unspecified: Secondary | ICD-10-CM | POA: Diagnosis present

## 2014-11-08 DIAGNOSIS — N920 Excessive and frequent menstruation with regular cycle: Secondary | ICD-10-CM | POA: Diagnosis present

## 2014-11-08 DIAGNOSIS — G629 Polyneuropathy, unspecified: Secondary | ICD-10-CM | POA: Diagnosis present

## 2014-11-08 DIAGNOSIS — D259 Leiomyoma of uterus, unspecified: Secondary | ICD-10-CM | POA: Diagnosis present

## 2014-11-08 DIAGNOSIS — I82411 Acute embolism and thrombosis of right femoral vein: Secondary | ICD-10-CM | POA: Diagnosis present

## 2014-11-08 DIAGNOSIS — J9801 Acute bronchospasm: Secondary | ICD-10-CM

## 2014-11-08 DIAGNOSIS — D219 Benign neoplasm of connective and other soft tissue, unspecified: Secondary | ICD-10-CM | POA: Diagnosis present

## 2014-11-08 DIAGNOSIS — D509 Iron deficiency anemia, unspecified: Secondary | ICD-10-CM

## 2014-11-08 DIAGNOSIS — R7989 Other specified abnormal findings of blood chemistry: Secondary | ICD-10-CM

## 2014-11-08 DIAGNOSIS — D72829 Elevated white blood cell count, unspecified: Secondary | ICD-10-CM

## 2014-11-08 DIAGNOSIS — E279 Disorder of adrenal gland, unspecified: Secondary | ICD-10-CM | POA: Diagnosis present

## 2014-11-08 DIAGNOSIS — R778 Other specified abnormalities of plasma proteins: Secondary | ICD-10-CM

## 2014-11-08 DIAGNOSIS — R0789 Other chest pain: Secondary | ICD-10-CM | POA: Diagnosis present

## 2014-11-08 LAB — CBC WITH DIFFERENTIAL/PLATELET
BASOS ABS: 0 10*3/uL (ref 0.0–0.1)
Basophils Relative: 0 % (ref 0–1)
Eosinophils Absolute: 0 10*3/uL (ref 0.0–0.7)
Eosinophils Relative: 0 % (ref 0–5)
HEMATOCRIT: 34.6 % — AB (ref 36.0–46.0)
Hemoglobin: 9.7 g/dL — ABNORMAL LOW (ref 12.0–15.0)
LYMPHS ABS: 2.1 10*3/uL (ref 0.7–4.0)
Lymphocytes Relative: 9 % — ABNORMAL LOW (ref 12–46)
MCH: 19.7 pg — AB (ref 26.0–34.0)
MCHC: 28 g/dL — ABNORMAL LOW (ref 30.0–36.0)
MCV: 70.2 fL — AB (ref 78.0–100.0)
MONO ABS: 1.1 10*3/uL — AB (ref 0.1–1.0)
MONOS PCT: 5 % (ref 3–12)
NEUTROS ABS: 19.7 10*3/uL — AB (ref 1.7–7.7)
Neutrophils Relative %: 86 % — ABNORMAL HIGH (ref 43–77)
Platelets: 281 10*3/uL (ref 150–400)
RBC: 4.93 MIL/uL (ref 3.87–5.11)
RDW: 26.2 % — AB (ref 11.5–15.5)
WBC: 22.9 10*3/uL — ABNORMAL HIGH (ref 4.0–10.5)

## 2014-11-08 LAB — BASIC METABOLIC PANEL
ANION GAP: 8 (ref 5–15)
BUN: 5 mg/dL — ABNORMAL LOW (ref 6–20)
CHLORIDE: 104 mmol/L (ref 101–111)
CO2: 23 mmol/L (ref 22–32)
CREATININE: 0.79 mg/dL (ref 0.44–1.00)
Calcium: 9.1 mg/dL (ref 8.9–10.3)
GFR calc Af Amer: >60 mL/min (ref >60)
GFR calc non Af Amer: >60 mL/min (ref >60)
Glucose, Bld: 151 mg/dL — ABNORMAL HIGH (ref 70–99)
POTASSIUM: 4 mmol/L (ref 3.5–5.1)
Sodium: 135 mmol/L (ref 135–145)

## 2014-11-08 LAB — CBC
HCT: 33.5 % — ABNORMAL LOW (ref 36.0–46.0)
Hemoglobin: 9.2 g/dL — ABNORMAL LOW (ref 12.0–15.0)
MCH: 19 pg — ABNORMAL LOW (ref 26.0–34.0)
MCHC: 27.5 g/dL — AB (ref 30.0–36.0)
MCV: 69.4 fL — ABNORMAL LOW (ref 78.0–100.0)
PLATELETS: 260 10*3/uL (ref 150–400)
RBC: 4.83 MIL/uL (ref 3.87–5.11)
RDW: 26 % — AB (ref 11.5–15.5)
WBC: 15.2 10*3/uL — ABNORMAL HIGH (ref 4.0–10.5)

## 2014-11-08 LAB — MRSA PCR SCREENING: MRSA BY PCR: NEGATIVE

## 2014-11-08 LAB — TROPONIN I
TROPONIN I: 0.12 ng/mL — AB (ref <0.031)
Troponin I: 0.15 ng/mL — ABNORMAL HIGH (ref <0.031)
Troponin I: 0.11 ng/mL — ABNORMAL HIGH (ref <0.031)
Troponin I: 0.1 ng/mL — ABNORMAL HIGH (ref <0.031)

## 2014-11-08 LAB — HEPARIN LEVEL (UNFRACTIONATED)
HEPARIN UNFRACTIONATED: 0.49 [IU]/mL (ref 0.30–0.70)
HEPARIN UNFRACTIONATED: 0.37 [IU]/mL (ref 0.30–0.70)

## 2014-11-08 LAB — D-DIMER, QUANTITATIVE: D-Dimer, Quant: 3.9 ug/mL-FEU — ABNORMAL HIGH (ref 0.00–0.48)

## 2014-11-08 MED ORDER — MORPHINE SULFATE 4 MG/ML IJ SOLN
4.0000 mg | Freq: Once | INTRAMUSCULAR | Status: AC
  Administered 2014-11-08: 4 mg via INTRAVENOUS
  Filled 2014-11-08: qty 1

## 2014-11-08 MED ORDER — PREDNISONE 20 MG PO TABS
60.0000 mg | ORAL_TABLET | Freq: Once | ORAL | Status: AC
  Administered 2014-11-08: 60 mg via ORAL
  Filled 2014-11-08: qty 3

## 2014-11-08 MED ORDER — SODIUM CHLORIDE 0.9 % IJ SOLN
3.0000 mL | Freq: Two times a day (BID) | INTRAMUSCULAR | Status: DC
  Administered 2014-11-08 – 2014-11-11 (×3): 3 mL via INTRAVENOUS

## 2014-11-08 MED ORDER — HEPARIN (PORCINE) IN NACL 100-0.45 UNIT/ML-% IJ SOLN
1850.0000 [IU]/h | INTRAMUSCULAR | Status: DC
  Administered 2014-11-08 (×2): 1600 [IU]/h via INTRAVENOUS
  Administered 2014-11-09: 1850 [IU]/h via INTRAVENOUS
  Filled 2014-11-08 (×4): qty 250

## 2014-11-08 MED ORDER — NITROGLYCERIN 0.4 MG SL SUBL: 0.4000 mg | SUBLINGUAL_TABLET | SUBLINGUAL | Status: DC | PRN

## 2014-11-08 MED ORDER — GABAPENTIN 100 MG PO CAPS
100.0000 mg | ORAL_CAPSULE | Freq: Two times a day (BID) | ORAL | Status: DC
  Administered 2014-11-08 – 2014-11-11 (×7): 100 mg via ORAL
  Filled 2014-11-08 (×8): qty 1

## 2014-11-08 MED ORDER — SODIUM CHLORIDE 0.9 % IV SOLN
INTRAVENOUS | Status: DC
  Administered 2014-11-08: 10:00:00 via INTRAVENOUS

## 2014-11-08 MED ORDER — ACETAMINOPHEN 650 MG RE SUPP: 650.0000 mg | Freq: Four times a day (QID) | RECTAL | Status: DC | PRN

## 2014-11-08 MED ORDER — IPRATROPIUM-ALBUTEROL 0.5-2.5 (3) MG/3ML IN SOLN
3.0000 mL | Freq: Once | RESPIRATORY_TRACT | Status: AC
  Administered 2014-11-08: 3 mL via RESPIRATORY_TRACT
  Filled 2014-11-08: qty 3

## 2014-11-08 MED ORDER — ASPIRIN EC 81 MG PO TBEC
81.0000 mg | DELAYED_RELEASE_TABLET | Freq: Every day | ORAL | Status: DC
  Administered 2014-11-08 – 2014-11-11 (×4): 81 mg via ORAL
  Filled 2014-11-08 (×4): qty 1

## 2014-11-08 MED ORDER — HEPARIN BOLUS VIA INFUSION
4000.0000 [IU] | Freq: Once | INTRAVENOUS | Status: AC
  Administered 2014-11-08: 4000 [IU] via INTRAVENOUS
  Filled 2014-11-08: qty 4000

## 2014-11-08 MED ORDER — NICOTINE 14 MG/24HR TD PT24
14.0000 mg | MEDICATED_PATCH | Freq: Every day | TRANSDERMAL | Status: DC
  Filled 2014-11-08 (×2): qty 1

## 2014-11-08 MED ORDER — OXYCODONE HCL 5 MG PO TABS
5.0000 mg | ORAL_TABLET | ORAL | Status: DC | PRN
  Administered 2014-11-08 – 2014-11-09 (×5): 5 mg via ORAL
  Filled 2014-11-08 (×5): qty 1

## 2014-11-08 MED ORDER — LEVALBUTEROL HCL 0.63 MG/3ML IN NEBU
0.6300 mg | INHALATION_SOLUTION | Freq: Four times a day (QID) | RESPIRATORY_TRACT | Status: DC | PRN
Start: 2014-11-08 — End: 2014-11-11

## 2014-11-08 MED ORDER — ONDANSETRON HCL 4 MG/2ML IJ SOLN
4.0000 mg | Freq: Four times a day (QID) | INTRAMUSCULAR | Status: DC | PRN
  Administered 2014-11-10: 4 mg via INTRAVENOUS
  Filled 2014-11-08: qty 2

## 2014-11-08 MED ORDER — IOHEXOL 350 MG/ML SOLN
80.0000 mL | Freq: Once | INTRAVENOUS | Status: AC | PRN
  Administered 2014-11-08: 80 mL via INTRAVENOUS

## 2014-11-08 MED ORDER — MORPHINE SULFATE 2 MG/ML IJ SOLN
1.0000 mg | INTRAMUSCULAR | Status: DC | PRN
  Administered 2014-11-08 – 2014-11-10 (×7): 1 mg via INTRAVENOUS
  Filled 2014-11-08 (×7): qty 1

## 2014-11-08 MED ORDER — ONDANSETRON HCL 4 MG PO TABS: 4.0000 mg | ORAL_TABLET | Freq: Four times a day (QID) | ORAL | Status: DC | PRN

## 2014-11-08 MED ORDER — NITROGLYCERIN 0.4 MG SL SUBL
0.4000 mg | SUBLINGUAL_TABLET | SUBLINGUAL | Status: DC | PRN
  Administered 2014-11-08: 0.4 mg via SUBLINGUAL

## 2014-11-08 MED ORDER — ACETAMINOPHEN 325 MG PO TABS: 650.0000 mg | ORAL_TABLET | Freq: Four times a day (QID) | ORAL | Status: DC | PRN

## 2014-11-08 MED ORDER — FERROUS SULFATE 325 (65 FE) MG PO TABS
325.0000 mg | ORAL_TABLET | Freq: Two times a day (BID) | ORAL | Status: DC
  Administered 2014-11-08 – 2014-11-11 (×6): 325 mg via ORAL
  Filled 2014-11-08 (×6): qty 1

## 2014-11-08 MED ORDER — DIPHENHYDRAMINE HCL 25 MG PO CAPS
50.0000 mg | ORAL_CAPSULE | Freq: Every evening | ORAL | Status: DC | PRN
  Administered 2014-11-08: 50 mg via ORAL
  Filled 2014-11-08 (×3): qty 2

## 2014-11-08 NOTE — ED Notes (Addendum)
Pt transported by EMS after 2nd visit by EMS. First visit pt c/o SOB and nausia and was given albuterol neb and NOT trasnsported. 2nd visit patient was transported for CP and SOB with dizziness. EMS reported that patient had suitcase packed prior to arrival 2nd time. EMS gave 2x SLNTG for decrease of CP from 7/10 to 3/10. Pt reports that pain is in ribs and is worse with deep breathing. EMS vital after SLNTG 131/80 HR 135, RR20 SPO2 99 on 3L. Pt sat is 933 % on RA. EMS gave 324 SLNTG and started IV in L hand.

## 2014-11-08 NOTE — ED Notes (Signed)
Ordered lunch tray 

## 2014-11-08 NOTE — Progress Notes (Signed)
ANTICOAGULATION CONSULT NOTE - Follow Up Consult  Pharmacy Consult for Heparin Indication: pulmonary embolus and DVT  Allergies  Allergen Reactions  . Penicillins Shortness Of Breath  . Rocephin [Ceftriaxone Sodium In Dextrose] Anaphylaxis  . Shellfish Allergy Anaphylaxis  . Citrus Rash    Patient Measurements: Height: 5\' 9"  (175.3 cm) Weight: 279 lb (126.554 kg) IBW/kg (Calculated) : 66.2 Heparin Dosing Weight: 100 kg  Vital Signs: Temp: 98 F (36.7 C) (05/10 1422) Temp Source: Oral (05/10 1422) BP: 120/72 mmHg (05/10 1422) Pulse Rate: 102 (05/10 1422)  Labs:  Recent Labs  11/08/14 0425 11/08/14 1013 11/08/14 1515  HGB 9.7*  --   --   HCT 34.6*  --   --   PLT 281  --   --   HEPARINUNFRC  --   --  0.49  CREATININE 0.79  --   --   TROPONINI 0.15* 0.12*  --     Estimated Creatinine Clearance: 125.4 mL/min (by C-G formula based on Cr of 0.79).   Medications:  Infusions:  . sodium chloride 10 mL/hr at 11/08/14 1016  . heparin 1,600 Units/hr (11/08/14 8616)    Assessment: 49 year old female admitted with chest pain found to have bilateral submassive PE and RLE DVT. PCCM reviewed case and decided against thrombolytics with low PESI score. Heparin per pharmacy continues as treatment.   Initial heparin level is therapeutic (note level is actually 8 hour level as drawn late due to patient transfer from ED to floor) after bolus and rate of 1600 units/hr. H/H are low on admission. Platelets are 281. No bleeding is reported.   Goal of Therapy:  Heparin level 0.3-0.7 units/ml Monitor platelets by anticoagulation protocol: Yes   Plan:  Continue heparin at 1600 units/hr. Recheck heparin level in 6 hours to confirm. Daily heparin level and CBC while on therapy.   Sloan Leiter, PharmD, BCPS Clinical Pharmacist [phone] 11/08/2014,4:16 PM

## 2014-11-08 NOTE — ED Notes (Addendum)
Pt reports quiting smoking 03 Nov 2014

## 2014-11-08 NOTE — ED Notes (Signed)
Appointment time @ 14:10 

## 2014-11-08 NOTE — Consult Note (Signed)
Name: Melinda [name] MRN: [id number] DOB: [birth year]    ADMISSION DATE:  11/08/2014 CONSULTATION DATE:  11/08/14  REFERRING MD :  Dr. Maryland Pink   CHIEF COMPLAINT:  SOB, Chest Pain.    BRIEF PATIENT DESCRIPTION: 49 y/o morbidly obese F, smoker,  with PMH of IDA due to fibriods (hx of tx due to severe anemia) and recent endometrial biopsy on 5/3 who presented to [facility] ER on 5/10 with SOB and chest pain.  Work up positive for R>L PE.  PCCM consulted for evaluation.      SIGNIFICANT EVENTS  5/03  Endometrial biopsy 5/10  Admit with PE  STUDIES:  5/10  CXR >> no acute process 5/10  CTA Chest >> large bilateral PE R>L, low densities in thyroid and 5.2 cm R adrenal mass   HISTORY OF PRESENT ILLNESS:  49 y/o morbidly obese F, smoker, with PMH of migraines, PTSD, GERD, scoliosis, ADHD, Peripheral neuropathy, low TSH, cholecystectomy, IDA due to fibroids (severe, has required transfusions as recent as 08/2014) with recent endometrial biopsy and heavy bleeding since who presented to [facility] ER on 5/10 am with shortness of breath and chest pain.    The patient reports she developed chest pain around 10 pm on the evening of 5/09 and thought it was possibly related to asthma. She also had dry cough, rib pain and shortness of breath.  AM of 5/10 she woke with ongoing chest pain.  She activated EMS and they gave her a nebulizer which initially helped and she sent them away.  The pain returned and she called EMS a second time and requested transport to ER.  ER evaluation included a CTA of the chest which was positive for large bilateral PE R>L, low densities in thyroid and 5.2 cm R adrenal mass.  She was admitted per [facility] for further care.  The patient was appropriately started on IV heparin.  PCCM consulted for evaluation of thrombolytic need.    The patient denies hx of clotting disorders, & prior DVT.  She did have a superficial vein thrombosis during her pregnancy with her son in the early 47's.  She notes  after the endometrial biopsy she has been less active than usual.     PAST MEDICAL HISTORY :   has a past medical history of Migraine; PTSD (post-traumatic stress disorder); Asthma; GERD (gastroesophageal reflux disease); Scoliosis; Arthritis; ADHD (attention deficit hyperactivity disorder); Peripheral neuropathy; Iron deficiency anemia due to chronic blood loss (09/09/2014); Hyperglycemia (09/09/2014); Low TSH level (09/08/2014); Menorrhagia (09/08/2014); and Ovarian mass, right (09/09/2014).  has past surgical history that includes Cholecystectomy.    Prior to Admission medications   Medication Sig Start Date End Date Taking? Authorizing Provider  acetaminophen (TYLENOL) 500 MG tablet Take 500 mg by mouth every 6 (six) hours as needed (patient takes 1-2 depending on pain level).   Yes Historical Provider, MD  diphenhydrAMINE (BENADRYL) 25 MG tablet Take 50 mg by mouth at bedtime as needed for sleep (sleep).   Yes Historical Provider, MD  ferrous sulfate 325 (65 FE) MG tablet Take 1 tablet (325 mg total) by mouth 2 (two) times daily with a meal. 10/18/14  Yes Arnoldo Morale, MD  gabapentin (NEURONTIN) 100 MG capsule Take 1 capsule (100 mg total) by mouth 2 (two) times daily. 09/09/14  Yes Venetia Maxon Rama, MD  Multiple Vitamins-Minerals (MULTIVITAMIN & MINERAL PO) Take 1 tablet by mouth daily.   Yes Historical Provider, MD  SUMAtriptan (IMITREX) 50 MG tablet Take 1 tablet (50 mg  total) by mouth every 2 (two) hours as needed for migraine or headache. May repeat in 2 hours if headache persists or recurs. 10/28/14  Yes Lorayne Marek, MD  butalbital-acetaminophen-caffeine (FIORICET/CODEINE) 50-325-40-30 MG per capsule Take 1 capsule by mouth every 4 (four) hours as needed for headache. Patient not taking: Reported on 11/08/2014 10/14/14   Arnoldo Morale, MD  HYDROcodone-acetaminophen (NORCO/VICODIN) 5-325 MG per tablet Take 1-2 tablets by mouth every 4 (four) hours as needed for moderate pain. Patient not taking:  Reported on 10/14/2014 09/09/14   Venetia Maxon Rama, MD   Allergies  Allergen Reactions  . Penicillins Shortness Of Breath  . Rocephin [Ceftriaxone Sodium In Dextrose] Anaphylaxis  . Shellfish Allergy Anaphylaxis  . Citrus Rash    FAMILY HISTORY:  family history includes Alcohol abuse in her father; Breast cancer in her mother; CAD in her mother; Diabetes in her mother; Hypertension in her father and mother.   SOCIAL HISTORY:  reports that she has been smoking Cigarettes.  She has a 20 pack-year smoking history. She does not have any smokeless tobacco history on file. She reports that she uses illicit drugs (Methamphetamines). She reports that she does not drink alcohol.  REVIEW OF SYSTEMS:   Constitutional: Negative for fever, chills, weight loss, malaise/fatigue and diaphoresis.  HENT: Negative for hearing loss, ear pain, nosebleeds, congestion, sore throat, neck pain, tinnitus and ear discharge.   Eyes: Negative for blurred vision, double vision, photophobia, pain, discharge and redness.  Respiratory: Negative for hemoptysis, sputum production, wheezing and stridor.  Reports dry cough, shortness of breath, pleuritic chest pain, pain with inspiration.  Cardiovascular: Negative for chest pain, palpitations, orthopnea, claudication, leg swelling and PND.  Gastrointestinal: Negative for heartburn, nausea, vomiting, abdominal pain, diarrhea, constipation, blood in stool and melena.  Genitourinary: Negative for dysuria, urgency, frequency, hematuria and flank pain.  Musculoskeletal: Negative for myalgias, back pain, joint pain and falls.  Skin: Negative for itching and rash.  Neurological: Negative for dizziness, tingling, tremors, sensory change, speech change, focal weakness, seizures, loss of consciousness, weakness and headaches.  Endo/Heme/Allergies: Negative for environmental allergies and polydipsia. Does not bruise/bleed easily.  SUBJECTIVE:   VITAL SIGNS: Temp:  [99.3 F (37.4 C)]  99.3 F (37.4 C) (05/10 0400) Pulse Rate:  [109-132] 111 (05/10 1000) Resp:  [13-24] 13 (05/10 1000) BP: (100-136)/(46-97) 134/83 mmHg (05/10 1000) SpO2:  [91 %-97 %] 95 % (05/10 1000) Weight:  [279 lb (126.554 kg)] 279 lb (126.554 kg) (05/10 0400)  PHYSICAL EXAMINATION: General:  Morbidly obese female in NAD Neuro:  AAOx4, speech clear, MAE  HEENT:  MM pink/moist, no jvd Cardiovascular:  s1s2 RRR, no m/r/g, mild tachy (108) Lungs:  Even/non-labored, lungs bilaterally clear  Abdomen:  Obese/soft, NT, bsx4 active  Musculoskeletal:  No acute deformities  Skin:  Warm/dry, no edema    Recent Labs Lab 11/08/14 0425  NA 135  K 4.0  CL 104  CO2 23  BUN 5*  CREATININE 0.79  GLUCOSE 151*    Recent Labs Lab 11/08/14 0425  HGB 9.7*  HCT 34.6*  WBC 22.9*  PLT 281   Dg Chest 2 View  11/08/2014   CLINICAL DATA:  Mid chest pain and shortness of breath beginning tonight  EXAM: CHEST  2 VIEW  COMPARISON:  Chest radiograph September 08, 2014  FINDINGS: The cardiac silhouette appears upper limits of normal in size, mediastinal silhouette is nonsuspicious. RIGHT middle lobe scarring, no pleural effusion or focal consolidation. No pneumothorax. Soft tissue planes and included osseous  structures are nonsuspicious. Mild degenerative change of thoracic spine.  IMPRESSION: No acute cardiopulmonary process ; stable chest radiograph from September 08, 2014.   Electronically Signed   By: Elon Alas   On: 11/08/2014 04:47   Ct Angio Chest Pe W/cm &/or Wo Cm  11/08/2014   CLINICAL DATA:  Acute chest pain.  EXAM: CT ANGIOGRAPHY CHEST WITH CONTRAST  TECHNIQUE: Multidetector CT imaging of the chest was performed using the standard protocol during bolus administration of intravenous contrast. Multiplanar CT image reconstructions and MIPs were obtained to evaluate the vascular anatomy.  CONTRAST:  67mL OMNIPAQUE IOHEXOL 350 MG/ML SOLN  COMPARISON:  Chest radiograph of same day.  FINDINGS: No pneumothorax or  pleural effusion is noted. Probable scarring or subsegmental atelectasis is noted posteriorly in the right lung base. There is no evidence of thoracic aortic dissection or aneurysm. Low densities are noted in both thyroid lobes. Large bilateral pulmonary emboli are noted, most prominently seen in the lower lobe branches of the right pulmonary artery. The RV/ LV ratio is calculated at 1.4 suggesting right heart strain. No mediastinal mass or adenopathy is noted.  Within the visualized portion of the abdomen, 5.2 cm low density mass with irregular calcifications and possible peripheral solid components is noted most consistent with adrenal mass. No significant osseous abnormality is noted.  Review of the MIP images confirms the above findings.  IMPRESSION: Large bilateral acute pulmonary emboli are noted with CT evidence of right heart strain (RV/LV Ratio = 1.4) consistent with at least submassive (intermediate risk) PE. The presence of right heart strain has been associated with an increased risk of morbidity and mortality. Please activate Code PE by paging [phone].  Bilateral low densities are noted in the thyroid lobes ; thyroid ultrasound is recommended for further evaluation.  5.2 cm probable right adrenal mass is noted with irregular calcifications and possible peripheral solid components. MRI is recommended for further evaluation on nonemergent basis.  Critical Value/emergent results were called by telephone at the time of interpretation on 11/08/2014 at 9:23 am to Dr. Roderic Palau, who verbally acknowledged these results.   Electronically Signed   By: Marijo Conception, M.D.   On: 11/08/2014 09:24    ASSESSMENT / PLAN:  Pulmonary Embolism - large, bilateral, L>R.  calculated PESI score of low risk (58 with hx of asthma/presumed chronic lung disease) Chest Pain - pleuritic secondary to above  Tobacco Abuse  Anemia  Recent Endometrial Biopsy   Plan: Agree with heparin therapy  Remain on bed rest until  heparin level therapeutic  Transition to oral agent of choice  Monitor for vaginal bleeding given fibroid history.  She will likely need close GYN follow up given recent biopsy and hx of anemia.  No role for thrombolytics Tobacco cessation encouraged    Thyroid Densities Adrenal Mass (R)  Plan: Per primary   Noe Gens, NP-C [facility] Pgr: [phone] or [phone] 11/08/2014, 10:24 AM

## 2014-11-08 NOTE — ED Notes (Signed)
Pt resting comfortably at this time CP is "much" better

## 2014-11-08 NOTE — ED Notes (Signed)
Meal tray ordered 

## 2014-11-08 NOTE — Progress Notes (Signed)
ANTICOAGULATION CONSULT NOTE - Initial Consult  Pharmacy Consult for heparin Indication: chest pain/ACS  Allergies  Allergen Reactions  . Penicillins Shortness Of Breath  . Rocephin [Ceftriaxone Sodium In Dextrose] Anaphylaxis  . Shellfish Allergy Anaphylaxis  . Citrus Rash    Patient Measurements: Height: 5\' 9"  (175.3 cm) Weight: 279 lb (126.554 kg) IBW/kg (Calculated) : 66.2 Heparin Dosing Weight: 100kg  Vital Signs: Temp: 99.3 F (37.4 C) (05/10 0400) Temp Source: Oral (05/10 0400) BP: 116/46 mmHg (05/10 0530) Pulse Rate: 122 (05/10 0530)  Labs:  Recent Labs  11/08/14 0425  HGB 9.7*  HCT 34.6*  PLT 281  CREATININE 0.79  TROPONINI 0.15*    Estimated Creatinine Clearance: 125.4 mL/min (by C-G formula based on Cr of 0.79).   Medical History: Past Medical History  Diagnosis Date  . Migraine   . PTSD (post-traumatic stress disorder)   . Asthma   . GERD (gastroesophageal reflux disease)   . Scoliosis   . Arthritis   . ADHD (attention deficit hyperactivity disorder)   . Peripheral neuropathy   . Iron deficiency anemia due to chronic blood loss 09/09/2014  . Hyperglycemia 09/09/2014  . Low TSH level 09/08/2014  . Menorrhagia 09/08/2014  . Ovarian mass, right 09/09/2014     Assessment: 49yo female c/o CP associated w/ SOB and dizziness, quit smoking 5d ago, initial troponin mildly elevated, to begin heparin.  Goal of Therapy:  Heparin level 0.3-0.7 units/ml Monitor platelets by anticoagulation protocol: Yes   Plan:  Will give heparin 4000 units IV bolus followed by gtt at 1400 units/hr and monitor heparin levels and CBC.  Wynona Neat, PharmD, BCPS  11/08/2014,6:37 AM

## 2014-11-08 NOTE — H&P (Addendum)
[facility] History and Physical  Melinda [name] LTJ:[id number] DOB: 1966/02/06 DOA: 11/08/2014   PCP: Lorayne Marek, MD  Specialists: Dr. Gracy Racer is her gynecologist  Chief Complaint: Chest pain or shortness of breath since last night  HPI: Melinda [name] is a 49 y.o. female with a past medical history of migraine headaches, iron deficiency anemia due to chronic blood loss from fibroids who was hospitalized in March for severe anemia and was transfused. She was referred to a gynecologist and underwent endometrial biopsy on May 3. Patient was in her usual state of health a few days ago when she started noticing a cough which was dry. Denies any hemoptysis. And then last night she developed chest pain which was in the central chest, as well as in the sides. She describes it as a pressure-like sensation and was 8 out of 10 in intensity. It would worsen with deep breathing and laying down. It reminded her of the pain that she had in the 90s when she was diagnosed with costochondritis. She was also wheezing last night and when EMS came, they give her a breathing treatment. Currently, the pain is 3 out of 10 in intensity. She had one episode of emesis last night, but denies any fever, chills, or abdominal pain. Denies any leg swelling. During her admission in March she was also diagnosed with fibroids and a right ovarian mass requiring follow-up. She continues to have some vaginal spotting after her biopsy. However, it is decreasing in amount. Has a history of superficial vein thrombosis associated with pregnancy many years ago. But denies any history of heart disease or pulmonary embolism or DVTs in the past.  Home Medications: Prior to Admission medications   Medication Sig Start Date End Date Taking? Authorizing Provider  acetaminophen (TYLENOL) 500 MG tablet Take 500 mg by mouth every 6 (six) hours as needed (patient takes 1-2 depending on pain level).   Yes Historical Provider, MD    diphenhydrAMINE (BENADRYL) 25 MG tablet Take 50 mg by mouth at bedtime as needed for sleep (sleep).   Yes Historical Provider, MD  ferrous sulfate 325 (65 FE) MG tablet Take 1 tablet (325 mg total) by mouth 2 (two) times daily with a meal. 10/18/14  Yes Arnoldo Morale, MD  gabapentin (NEURONTIN) 100 MG capsule Take 1 capsule (100 mg total) by mouth 2 (two) times daily. 09/09/14  Yes Venetia Maxon Rama, MD  Multiple Vitamins-Minerals (MULTIVITAMIN & MINERAL PO) Take 1 tablet by mouth daily.   Yes Historical Provider, MD  SUMAtriptan (IMITREX) 50 MG tablet Take 1 tablet (50 mg total) by mouth every 2 (two) hours as needed for migraine or headache. May repeat in 2 hours if headache persists or recurs. 10/28/14  Yes Lorayne Marek, MD  butalbital-acetaminophen-caffeine (FIORICET/CODEINE) 50-325-40-30 MG per capsule Take 1 capsule by mouth every 4 (four) hours as needed for headache. Patient not taking: Reported on 11/08/2014 10/14/14   Arnoldo Morale, MD  HYDROcodone-acetaminophen (NORCO/VICODIN) 5-325 MG per tablet Take 1-2 tablets by mouth every 4 (four) hours as needed for moderate pain. Patient not taking: Reported on 10/14/2014 09/09/14   Venetia Maxon Rama, MD    Allergies:  Allergies  Allergen Reactions  . Penicillins Shortness Of Breath  . Rocephin [Ceftriaxone Sodium In Dextrose] Anaphylaxis  . Shellfish Allergy Anaphylaxis  . Citrus Rash    Past Medical History: Past Medical History  Diagnosis Date  . Migraine   . PTSD (post-traumatic stress disorder)   . Asthma   . GERD (gastroesophageal  reflux disease)   . Scoliosis   . Arthritis   . ADHD (attention deficit hyperactivity disorder)   . Peripheral neuropathy   . Iron deficiency anemia due to chronic blood loss 09/09/2014  . Hyperglycemia 09/09/2014  . Low TSH level 09/08/2014  . Menorrhagia 09/08/2014  . Ovarian mass, right 09/09/2014    Past Surgical History  Procedure Laterality Date  . Cholecystectomy      Social History: She lives  in [place] by herself. Quit smoking in early this month. Was smoking 2 packs of cigarettes on a weekly basis. Occasional alcohol use. Denies any illicit drug use. Usually independent with daily activities.  Family History:  Family History  Problem Relation Age of Onset  . Diabetes Mother   . Breast cancer Mother   . CAD Mother   . Hypertension Mother   . Alcohol abuse Father   . Hypertension Father      Review of Systems - History obtained from the patient General ROS: positive for  - fatigue Psychological ROS: negative Ophthalmic ROS: negative ENT ROS: negative Allergy and Immunology ROS: negative Hematological and Lymphatic ROS: negative Endocrine ROS: negative Respiratory ROS: as in hpi Cardiovascular ROS: as in hpi Gastrointestinal ROS: no abdominal pain, change in bowel habits, or black or bloody stools Genito-Urinary ROS: as in hpi Musculoskeletal ROS: negative Neurological ROS: no TIA or stroke symptoms Dermatological ROS: negative  Physical Examination  Filed Vitals:   11/08/14 0630 11/08/14 0645 11/08/14 0700 11/08/14 0715  BP: 128/79 115/69 121/56 118/97  Pulse: 118 121 117 117  Temp:      TempSrc:      Resp: '24 20 21 18  ' Height:      Weight:      SpO2: 92% 93% 95% 94%    BP 118/97 mmHg  Pulse 117  Temp(Src) 99.3 F (37.4 C) (Oral)  Resp 18  Ht '5\' 9"'  (1.753 m)  Wt 126.554 kg (279 lb)  BMI 41.18 kg/m2  SpO2 94%  LMP 09/26/2014  General appearance: alert, cooperative, appears stated age, no distress and moderately obese Head: Normocephalic, without obvious abnormality, atraumatic Eyes: conjunctivae/corneas clear. PERRL, EOM's intact. Throat: lips, mucosa, and tongue normal; teeth and gums normal Neck: no adenopathy, no carotid bruit, no JVD, supple, symmetrical, trachea midline and thyroid not enlarged, symmetric, no tenderness/mass/nodules Resp: Decreased air entry at the bases. No wheezing currently. Cardio: S1, S2 is tachycardic. Regular. No  S3, S4. No rubs, murmurs, or bruit. No pedal edema. GI: soft, non-tender; bowel sounds normal; no masses,  no organomegaly Extremities: extremities normal, atraumatic, no cyanosis or edema Pulses: 2+ and symmetric Skin: Skin color, texture, turgor normal. No rashes or lesions Lymph nodes: Cervical, supraclavicular, and axillary nodes normal. Neurologic: No focal deficits.  Laboratory Data: Results for orders placed or performed during the hospital encounter of 11/08/14 (from the past 48 hour(s))  Basic metabolic panel     Status: Abnormal   Collection Time: 11/08/14  4:25 AM  Result Value Ref Range   Sodium 135 135 - 145 mmol/L   Potassium 4.0 3.5 - 5.1 mmol/L   Chloride 104 101 - 111 mmol/L   CO2 23 22 - 32 mmol/L   Glucose, Bld 151 (H) 70 - 99 mg/dL   BUN 5 (L) 6 - 20 mg/dL   Creatinine, Ser 0.79 0.44 - 1.00 mg/dL   Calcium 9.1 8.9 - 10.3 mg/dL   GFR calc non Af Amer >60 >60 mL/min   GFR calc Af Amer >60 >60  mL/min    Comment: (NOTE) The eGFR has been calculated using the CKD EPI equation. This calculation has not been validated in all clinical situations. eGFR's persistently <60 mL/min signify possible Chronic Kidney Disease.    Anion gap 8 5 - 15  CBC with Differential     Status: Abnormal   Collection Time: 11/08/14  4:25 AM  Result Value Ref Range   WBC 22.9 (H) 4.0 - 10.5 K/uL   RBC 4.93 3.87 - 5.11 MIL/uL   Hemoglobin 9.7 (L) 12.0 - 15.0 g/dL   HCT 34.6 (L) 36.0 - 46.0 %   MCV 70.2 (L) 78.0 - 100.0 fL   MCH 19.7 (L) 26.0 - 34.0 pg   MCHC 28.0 (L) 30.0 - 36.0 g/dL   RDW 26.2 (H) 11.5 - 15.5 %   Platelets 281 150 - 400 K/uL    Comment: PLATELET COUNT CONFIRMED BY SMEAR REPEATED TO VERIFY    Neutrophils Relative % 86 (H) 43 - 77 %   Lymphocytes Relative 9 (L) 12 - 46 %   Monocytes Relative 5 3 - 12 %   Eosinophils Relative 0 0 - 5 %   Basophils Relative 0 0 - 1 %   Neutro Abs 19.7 (H) 1.7 - 7.7 K/uL   Lymphs Abs 2.1 0.7 - 4.0 K/uL   Monocytes Absolute 1.1 (H)  0.1 - 1.0 K/uL   Eosinophils Absolute 0.0 0.0 - 0.7 K/uL   Basophils Absolute 0.0 0.0 - 0.1 K/uL   RBC Morphology POLYCHROMASIA PRESENT    WBC Morphology ATYPICAL LYMPHOCYTES   Troponin I     Status: Abnormal   Collection Time: 11/08/14  4:25 AM  Result Value Ref Range   Troponin I 0.15 (H) <0.031 ng/mL    Comment:        PERSISTENTLY INCREASED TROPONIN VALUES IN THE RANGE OF 0.04-0.49 ng/mL CAN BE SEEN IN:       -UNSTABLE ANGINA       -CONGESTIVE HEART FAILURE       -MYOCARDITIS       -CHEST TRAUMA       -ARRYHTHMIAS       -LATE PRESENTING MYOCARDIAL INFARCTION       -COPD   CLINICAL FOLLOW-UP RECOMMENDED.   D-dimer, quantitative     Status: Abnormal   Collection Time: 11/08/14  6:38 AM  Result Value Ref Range   D-Dimer, Quant 3.90 (H) 0.00 - 0.48 ug/mL-FEU    Comment:        AT THE INHOUSE ESTABLISHED CUTOFF VALUE OF 0.48 ug/mL FEU, THIS ASSAY HAS BEEN DOCUMENTED IN THE LITERATURE TO HAVE A SENSITIVITY AND NEGATIVE PREDICTIVE VALUE OF AT LEAST 98 TO 99%.  THE TEST RESULT SHOULD BE CORRELATED WITH AN ASSESSMENT OF THE CLINICAL PROBABILITY OF DVT / VTE.     Radiology Reports: Dg Chest 2 View  11/08/2014   CLINICAL DATA:  Mid chest pain and shortness of breath beginning tonight  EXAM: CHEST  2 VIEW  COMPARISON:  Chest radiograph September 08, 2014  FINDINGS: The cardiac silhouette appears upper limits of normal in size, mediastinal silhouette is nonsuspicious. RIGHT middle lobe scarring, no pleural effusion or focal consolidation. No pneumothorax. Soft tissue planes and included osseous structures are nonsuspicious. Mild degenerative change of thoracic spine.  IMPRESSION: No acute cardiopulmonary process ; stable chest radiograph from September 08, 2014.   Electronically Signed   By: Elon Alas   On: 11/08/2014 04:47    Electrocardiogram: Sinus tachycardia at 132 bpm.  Normal axis. Intervals normal. No concerning ST or T-wave changes.  Problem List  Principal Problem:    Chest pain Active Problems:   Menorrhagia   Iron deficiency anemia due to chronic blood loss   Fibroids   Sinus tachycardia   Assessment: This is a 49 year old African-American female who presents with chest pain, shortness of breath. She's noted to be tachycardic. Her troponin is mildly elevated. D-dimer is also high. Her symptoms are suggestive of venous thromboembolism. She underwent recent procedure. She is obese. Risk factors for coronary artery disease include obesity and tobacco smoking. She will undergo a CT chest. She'll be admitted to the hospital for further management.  Plan: #1 chest pain with dyspnea: We will need to rule out the pulmonary embolism. This will be done by obtaining a CT scan of her chest. We will cycle her troponins. We will get an echocardiogram. If the CT angiogram is negative for PE she will need to be seen by cardiology. She'll be placed on intravenous heparin for now. She'll need to be monitored closely due to her vaginal bleeding. At this time benefits of anticoagulation outweigh the risk.  #2 Recent menorrhagia with ovarian mass status post endometrial biopsy: She is followed by Dr. Gracy Racer. I have put out a page to him. I'm waiting to hear back from him. She has been experiencing postbiopsy bleeding but has been improving every day. Now that she will be placed on anticoagulation this will need to be monitored closely.  #3 history of iron deficiency anemia: Continue with her iron tablets. Monitor hemoglobin closely.  #4 history of tobacco abuse: Nicotine patch  ADDENDUM CT chest suggests submassive pulmonary embolism bilaterally. Adrenal mass also noted on the right. Low densities noted in the thyroid lobes as well. [facility] consult pulmonology. Continue with IV heparin. Proceed with echocardiogram. Etiology for her pulmonary embolism is not clear. She did undergo endometrial biopsy on May 3. This could have placed her at higher risk. She is also obese.     I have spoken to Dr. Ruthann Cancer with gynecology. He says that he hasn't heard in the any adverse reports on her biopsy. He recommends continuing with anticoagulation and to call him if her bleeding gets worse.  DVT Prophylaxis: On IV heparin Code Status: Full code Family Communication: Discussed with the patient  Disposition Plan: Admit to stepdown   Further management decisions will depend on results of further testing and patient's response to treatment.   [facility]  [facility] Pager [phone]  If 7PM-7AM, please contact night-coverage [email or web address] Password [facility]  11/08/2014, 7:48 AM

## 2014-11-08 NOTE — ED Notes (Signed)
MD at BS

## 2014-11-08 NOTE — Progress Notes (Signed)
ANTICOAGULATION CONSULT NOTE - Follow Up Consult  Pharmacy Consult for Heparin Indication: pulmonary embolus and DVT  Allergies  Allergen Reactions  . Penicillins Shortness Of Breath  . Rocephin [Ceftriaxone Sodium In Dextrose] Anaphylaxis  . Shellfish Allergy Anaphylaxis  . Citrus Rash    Patient Measurements: Height: 5\' 9"  (175.3 cm) Weight: 279 lb (126.554 kg) IBW/kg (Calculated) : 66.2 Heparin Dosing Weight: 100 kg  Vital Signs: Temp: 97.5 F (36.4 C) (05/10 2116) Temp Source: Oral (05/10 2116) BP: 120/78 mmHg (05/10 2116) Pulse Rate: 115 (05/10 2116)  Labs:  Recent Labs  11/08/14 0425 11/08/14 1013 11/08/14 1515 11/08/14 1645 11/08/14 2105  HGB 9.7*  --   --  9.2*  --   HCT 34.6*  --   --  33.5*  --   PLT 281  --   --  260  --   HEPARINUNFRC  --   --  0.49  --  0.37  CREATININE 0.79  --   --   --   --   TROPONINI 0.15* 0.12* 0.11*  --  0.10*    Estimated Creatinine Clearance: 125.4 mL/min (by C-G formula based on Cr of 0.79).   Medications:  Infusions:  . sodium chloride 10 mL/hr at 11/08/14 1016  . heparin 1,600 Units/hr (11/08/14 2136)    Assessment: 49 year old female admitted with chest pain found to have bilateral submassive PE and RLE DVT. PCCM reviewed case and decided against thrombolytics with low PESI score. Heparin per pharmacy continues as treatment.   Initial heparin level is therapeutic (note level is actually 8 hour level as drawn late due to patient transfer from ED to floor) after bolus and rate of 1600 units/hr. H/H are low on admission. Platelets are 281. No bleeding is reported.  Confirmation heparin level is also therapeutic but trending down.  Will increase infusion rate slightly to maintain in therapeutic range.   Goal of Therapy:  Heparin level 0.3-0.7 units/ml Monitor platelets by anticoagulation protocol: Yes   Plan:  Increase heparin to 1700 units/hr. Next heparin level with AM labs.  [employer], Pharm.D.,  BCPS Clinical Pharmacist Pager [phone] 11/08/2014 10:43 PM

## 2014-11-08 NOTE — ED Notes (Signed)
Pt gbvrimacing and clutching chest

## 2014-11-08 NOTE — ED Provider Notes (Signed)
CSN: [id number]     Arrival date & time 11/08/14  0341 History   First MD Initiated Contact with Patient 11/08/14 [phone]     Chief Complaint  Patient presents with  . Chest Pain     (Consider location/radiation/quality/duration/timing/severity/associated sxs/prior Treatment) Patient is a 49 y.o. female presenting with chest pain. The history is provided by the patient.  Chest Pain She had onset at about 10:30 PM of sharp and squeezing chest pain with associated cough and dyspnea. Cough is nonproductive. There is mild nausea and she did have diaphoresis on one occasion. She denies fever, chills, sweats. She called for EMS and was given an albuterol nebulizer treatment and she felt much better and declined transport at that time. However, about 90 minutes later, she started having recurrence of symptoms. She does relate that she had a nebulizer at home but has not used it in 10 years. She quit smoking 5 days ago. She has been diagnosed with pre-diabetes but does not have history of hypertension or hyperlipidemia. Of note, patient was transferred reported by EMS and she did receive aspirin and nitroglycerin in the ambulance with slight improvement.  Past Medical History  Diagnosis Date  . Migraine   . PTSD (post-traumatic stress disorder)   . Asthma   . GERD (gastroesophageal reflux disease)   . Scoliosis   . Arthritis   . ADHD (attention deficit hyperactivity disorder)   . Peripheral neuropathy   . Iron deficiency anemia due to chronic blood loss 09/09/2014  . Hyperglycemia 09/09/2014  . Low TSH level 09/08/2014  . Menorrhagia 09/08/2014  . Ovarian mass, right 09/09/2014   Past Surgical History  Procedure Laterality Date  . Cholecystectomy     Family History  Problem Relation Age of Onset  . Diabetes Mother   . Breast cancer Mother   . CAD Mother   . Hypertension Mother   . Alcohol abuse Father   . Hypertension Father    History  Substance Use Topics  . Smoking status: Current  Some Day Smoker  . Smokeless tobacco: Not on file  . Alcohol Use: No   OB History    No data available     Review of Systems  Cardiovascular: Positive for chest pain.  All other systems reviewed and are negative.     Allergies  Penicillins; Rocephin; Shellfish allergy; and Citrus  Home Medications   Prior to Admission medications   Medication Sig Start Date End Date Taking? Authorizing Provider  acetaminophen (TYLENOL) 500 MG tablet Take 500 mg by mouth every 6 (six) hours as needed (patient takes 1-2 depending on pain level).    Historical Provider, MD  butalbital-acetaminophen-caffeine (FIORICET/CODEINE) 50-325-40-30 MG per capsule Take 1 capsule by mouth every 4 (four) hours as needed for headache. 10/14/14   Arnoldo Morale, MD  diphenhydrAMINE (BENADRYL) 25 MG tablet Take 50 mg by mouth at bedtime as needed for sleep (sleep).    Historical Provider, MD  ferrous sulfate 325 (65 FE) MG tablet Take 1 tablet (325 mg total) by mouth 2 (two) times daily with a meal. 10/18/14   Arnoldo Morale, MD  gabapentin (NEURONTIN) 100 MG capsule Take 1 capsule (100 mg total) by mouth 2 (two) times daily. 09/09/14   Venetia Maxon Rama, MD  HYDROcodone-acetaminophen (NORCO/VICODIN) 5-325 MG per tablet Take 1-2 tablets by mouth every 4 (four) hours as needed for moderate pain. Patient not taking: Reported on 10/14/2014 09/09/14   Venetia Maxon Rama, MD  Multiple Vitamins-Minerals (MULTIVITAMIN & MINERAL PO)  Take 1 tablet by mouth daily.    Historical Provider, MD  SUMAtriptan (IMITREX) 50 MG tablet Take 1 tablet (50 mg total) by mouth every 2 (two) hours as needed for migraine or headache. May repeat in 2 hours if headache persists or recurs. 10/28/14   Lorayne Marek, MD   BP 118/74 mmHg  Pulse 132  Temp(Src) 99.3 F (37.4 C) (Oral)  Resp 18  Ht 5\' 9"  (1.753 m)  Wt 279 lb (126.554 kg)  BMI 41.18 kg/m2  SpO2 93%  LMP 09/26/2014 Physical Exam  Nursing note and vitals reviewed.  49 year old female,  resting comfortably and in no acute distress. Vital signs are significant for tachycardia. Oxygen saturation is 93%, which is normal. Head is normocephalic and atraumatic. PERRLA, EOMI. Oropharynx is clear. Neck is nontender and supple without adenopathy or JVD. Back is nontender and there is no CVA tenderness. Lungs have no rales, wheezes, rhonchi when the patient is taking shallow breaths. When she tries to take a deep breath, she coughs and is noted have significant wheezing.. Chest is nontender. Heart has regular rate and rhythm without murmur. Abdomen is soft, flat, nontender without masses or hepatosplenomegaly and peristalsis is normoactive. Extremities have no cyanosis or edema, full range of motion is present. Skin is warm and dry without rash. Neurologic: Mental status is normal, cranial nerves are intact, there are no motor or sensory deficits.  ED Course  Procedures (including critical care time) Labs Review Results for orders placed or performed during the hospital encounter of 53/66/44  Basic metabolic panel  Result Value Ref Range   Sodium 135 135 - 145 mmol/L   Potassium 4.0 3.5 - 5.1 mmol/L   Chloride 104 101 - 111 mmol/L   CO2 23 22 - 32 mmol/L   Glucose, Bld 151 (H) 70 - 99 mg/dL   BUN 5 (L) 6 - 20 mg/dL   Creatinine, Ser 0.79 0.44 - 1.00 mg/dL   Calcium 9.1 8.9 - 10.3 mg/dL   GFR calc non Af Amer >60 >60 mL/min   GFR calc Af Amer >60 >60 mL/min   Anion gap 8 5 - 15  CBC with Differential  Result Value Ref Range   WBC 22.9 (H) 4.0 - 10.5 K/uL   RBC 4.93 3.87 - 5.11 MIL/uL   Hemoglobin 9.7 (L) 12.0 - 15.0 g/dL   HCT 34.6 (L) 36.0 - 46.0 %   MCV 70.2 (L) 78.0 - 100.0 fL   MCH 19.7 (L) 26.0 - 34.0 pg   MCHC 28.0 (L) 30.0 - 36.0 g/dL   RDW 26.2 (H) 11.5 - 15.5 %   Platelets 281 150 - 400 K/uL   Neutrophils Relative % 86 (H) 43 - 77 %   Lymphocytes Relative 9 (L) 12 - 46 %   Monocytes Relative 5 3 - 12 %   Eosinophils Relative 0 0 - 5 %   Basophils Relative 0  0 - 1 %   Neutro Abs 19.7 (H) 1.7 - 7.7 K/uL   Lymphs Abs 2.1 0.7 - 4.0 K/uL   Monocytes Absolute 1.1 (H) 0.1 - 1.0 K/uL   Eosinophils Absolute 0.0 0.0 - 0.7 K/uL   Basophils Absolute 0.0 0.0 - 0.1 K/uL   RBC Morphology POLYCHROMASIA PRESENT    WBC Morphology ATYPICAL LYMPHOCYTES   Troponin I  Result Value Ref Range   Troponin I 0.15 (H) <0.031 ng/mL  D-dimer, quantitative  Result Value Ref Range   D-Dimer, Quant 3.90 (H) 0.00 - 0.48  ug/mL-FEU   Imaging Review Dg Chest 2 View  11/08/2014   CLINICAL DATA:  Mid chest pain and shortness of breath beginning tonight  EXAM: CHEST  2 VIEW  COMPARISON:  Chest radiograph September 08, 2014  FINDINGS: The cardiac silhouette appears upper limits of normal in size, mediastinal silhouette is nonsuspicious. RIGHT middle lobe scarring, no pleural effusion or focal consolidation. No pneumothorax. Soft tissue planes and included osseous structures are nonsuspicious. Mild degenerative change of thoracic spine.  IMPRESSION: No acute cardiopulmonary process ; stable chest radiograph from September 08, 2014.   Electronically Signed   By: Elon Alas   On: 11/08/2014 04:47     EKG Interpretation   Date/Time:  Tuesday Nov 08 2014 03:56:55 EDT Ventricular Rate:  132 PR Interval:  128 QRS Duration: 78 QT Interval:  316 QTC Calculation: 468 R Axis:   56 Text Interpretation:  Sinus tachycardia Borderline repolarization  abnormality When compared with ECG of 3/102016, HEART RATE has increased  Borderline REPOLARIZATION ABNORMALITY is now Present - possibly rate  related Confirmed by [facility]  MD, Tyiesha Brackney ([id number]) on 11/08/2014 4:13:47 AM      MDM   Final diagnoses:  Chest pain, unspecified chest pain type  Bronchospasm  Elevated troponin  Elevated d-dimer  Microcytic anemia  Leukocytosis    Chest pain which appears to be related to bronchospasm-probable acute bronchitis. Chest x-ray will be obtained and she will be given steroids and nebulizer treatment  with albuterol and ipratropium.  After albuterol with ipratropium, she states that she doesn't feel any better but with forced exhalation there is minimal wheezing. However, she is still tachycardic. Troponin is come back slightly elevated. Patient did have an endometrial biopsy about one month ago and does have history of DVT when pregnant as a teenager. This could conceivably represent pulmonary embolism. In either case, she will need to be admitted. She started on heparin, and d-dimer is obtained to rule out DVT.  D-dimer has come back positive and CT angiogram has been ordered. Case has been discussed with Dr. [place] Pink [facility] who agrees to admit the patient.  Delora Fuel, MD 77/93/90 3009

## 2014-11-08 NOTE — Progress Notes (Signed)
*  PRELIMINARY RESULTS* Vascular Ultrasound Lower extremity venous duplex has been completed.  Preliminary findings: DVT noted in the Right femoral vein. No DVT LLE.   Landry Mellow, RDMS, RVT  11/08/2014, 11:53 AM

## 2014-11-09 ENCOUNTER — Ambulatory Visit (HOSPITAL_COMMUNITY): Payer: Medicaid Other

## 2014-11-09 ENCOUNTER — Encounter (HOSPITAL_COMMUNITY): Payer: Self-pay | Admitting: General Practice

## 2014-11-09 DIAGNOSIS — I2699 Other pulmonary embolism without acute cor pulmonale: Principal | ICD-10-CM

## 2014-11-09 DIAGNOSIS — R079 Chest pain, unspecified: Secondary | ICD-10-CM

## 2014-11-09 LAB — CBC
HEMATOCRIT: 32.8 % — AB (ref 36.0–46.0)
HEMOGLOBIN: 9 g/dL — AB (ref 12.0–15.0)
MCH: 19.1 pg — ABNORMAL LOW (ref 26.0–34.0)
MCHC: 27.4 g/dL — ABNORMAL LOW (ref 30.0–36.0)
MCV: 69.6 fL — ABNORMAL LOW (ref 78.0–100.0)
Platelets: 266 10*3/uL (ref 150–400)
RBC: 4.71 MIL/uL (ref 3.87–5.11)
RDW: 26.3 % — AB (ref 11.5–15.5)
WBC: 16.2 10*3/uL — ABNORMAL HIGH (ref 4.0–10.5)

## 2014-11-09 LAB — URINALYSIS, ROUTINE W REFLEX MICROSCOPIC
Bilirubin Urine: NEGATIVE
GLUCOSE, UA: NEGATIVE mg/dL
Hgb urine dipstick: NEGATIVE
KETONES UR: NEGATIVE mg/dL
Nitrite: POSITIVE — AB
PROTEIN: NEGATIVE mg/dL
Specific Gravity, Urine: 1.016 (ref 1.005–1.030)
UROBILINOGEN UA: 0.2 mg/dL (ref 0.0–1.0)
pH: 6 (ref 5.0–8.0)

## 2014-11-09 LAB — COMPREHENSIVE METABOLIC PANEL
ALT: 11 U/L — ABNORMAL LOW (ref 14–54)
AST: 57 U/L — AB (ref 15–41)
Albumin: 3.1 g/dL — ABNORMAL LOW (ref 3.5–5.0)
Alkaline Phosphatase: 75 U/L (ref 38–126)
Anion gap: 9 (ref 5–15)
BUN: 7 mg/dL (ref 6–20)
CALCIUM: 8.5 mg/dL — AB (ref 8.9–10.3)
CHLORIDE: 104 mmol/L (ref 101–111)
CO2: 22 mmol/L (ref 22–32)
CREATININE: 0.66 mg/dL (ref 0.44–1.00)
GFR calc Af Amer: >60 mL/min (ref >60)
GFR calc non Af Amer: >60 mL/min (ref >60)
GLUCOSE: 132 mg/dL — AB (ref 70–99)
Potassium: 4.7 mmol/L (ref 3.5–5.1)
Sodium: 135 mmol/L (ref 135–145)
Total Bilirubin: 1 mg/dL (ref 0.3–1.2)
Total Protein: 6.1 g/dL — ABNORMAL LOW (ref 6.5–8.1)

## 2014-11-09 LAB — HEPARIN LEVEL (UNFRACTIONATED)
HEPARIN UNFRACTIONATED: 0.23 [IU]/mL — AB (ref 0.30–0.70)
Heparin Unfractionated: 0.33 IU/mL (ref 0.30–0.70)

## 2014-11-09 LAB — URINE MICROSCOPIC-ADD ON

## 2014-11-09 MED ORDER — SODIUM CHLORIDE 0.9 % IV BOLUS (SEPSIS)
1000.0000 mL | Freq: Once | INTRAVENOUS | Status: AC
  Administered 2014-11-09: 1000 mL via INTRAVENOUS

## 2014-11-09 MED ORDER — OXYCODONE HCL 5 MG PO TABS
10.0000 mg | ORAL_TABLET | ORAL | Status: DC | PRN
  Administered 2014-11-09 – 2014-11-11 (×6): 10 mg via ORAL
  Filled 2014-11-09 (×6): qty 2

## 2014-11-09 MED ORDER — CETYLPYRIDINIUM CHLORIDE 0.05 % MT LIQD
7.0000 mL | Freq: Two times a day (BID) | OROMUCOSAL | Status: DC
  Administered 2014-11-09 – 2014-11-11 (×4): 7 mL via OROMUCOSAL

## 2014-11-09 MED ORDER — OXYCODONE HCL 5 MG PO TABS
5.0000 mg | ORAL_TABLET | Freq: Once | ORAL | Status: AC
  Administered 2014-11-09: 5 mg via ORAL
  Filled 2014-11-09: qty 1

## 2014-11-09 MED ORDER — HEPARIN (PORCINE) IN NACL 100-0.45 UNIT/ML-% IJ SOLN
1900.0000 [IU]/h | INTRAMUSCULAR | Status: DC
  Administered 2014-11-10: 1900 [IU]/h via INTRAVENOUS
  Filled 2014-11-09: qty 250

## 2014-11-09 MED ORDER — CIPROFLOXACIN IN D5W 400 MG/200ML IV SOLN
400.0000 mg | Freq: Two times a day (BID) | INTRAVENOUS | Status: DC
  Administered 2014-11-09 – 2014-11-11 (×4): 400 mg via INTRAVENOUS
  Filled 2014-11-09 (×6): qty 200

## 2014-11-09 MED ORDER — METOPROLOL TARTRATE 1 MG/ML IV SOLN: 5.0000 mg | Freq: Four times a day (QID) | INTRAVENOUS | Status: DC | PRN

## 2014-11-09 MED ORDER — PERFLUTREN LIPID MICROSPHERE
1.0000 mL | INTRAVENOUS | Status: AC | PRN
  Administered 2014-11-09: 2 mL via INTRAVENOUS
  Filled 2014-11-09: qty 10

## 2014-11-09 NOTE — Progress Notes (Signed)
[facility] PROGRESS NOTE  Melinda [name] NAT:[id number] DOB: [birth year] DOA: 11/08/2014 PCP: Lorayne Marek, MD  Assessment/Plan: Principal Problem:   Acute pulmonary embolism/DVT:   - Patient presented to ER with SOB and and chest pain.  D-Dimer positive with CT showing bilateral pulmonary embolism.  PESI score low risk 56. -etiology not certain, she is obese, history of tobacco use, and had a biopsy done on ovarian mass(11/01/14) -Pulmonology consulted- continue IV Heparin(0.3-0.7 level therapeutic per pharmacy) and bed rest for 48 hours, then switch to oral anticoagulant, but no thrombolysis. -Monitor for Vaginal bleeding. Oxycodone for pain.   Active Problems:    Menorrhagia:  Patient has a history of vaginal bleeding with a biopsy done on an ovarian mass on 11/01/14.  - Results not available. Concerned for cancer with hypercoagulable state.   -Outpatient follow up after discharge    Iron deficiency anemia due to chronic blood loss: -Likely due to blood loss from ovarian mass -Taking iron tablets and reports no constipation  -Hemoglobin has increased from 7.7 (09/08/14) to 9.0 today -Follow closely    Fibroids -Biopsy done 11/01/14 -Follow up outpatient     Chest pain -Likely due to PE, however, troponin elevated likely due to heart strain also from PE -Repeat EKG's with no ST elevation, troponin is downtrending -awaiting results from echo -Troponin elevation is likely secondary to the PE and the RV strain secondary to the submassive PE.    Sinus tachycardia -Will likely resolve upon treatment of PE -Consider Beta blocker if doesn't decrease -Monitor   Tobacco abuse:   -cigarette smoker -nicotine patch -smoking cessation   Code Status: Full Code  Family Communication: No family present. Plan discussed with patient.  Disposition Plan:  Likely discharge on oral anticoag if no complication in 1-2 days    Consultants:  Pharmacy, Pulmonology   Procedures:  Echo  today  Antibiotics:  None  HPI/Subjective: Patient is sitting up in bed in some visible distress.    Objective: Filed Vitals:   11/09/14 0816  BP: 114/75  Pulse: 116  Temp:   Resp: 20    Intake/Output Summary (Last 24 hours) at 11/09/14 1135 Last data filed at 11/09/14 0950  Gross per 24 hour  Intake      0 ml  Output    850 ml  Net   -850 ml   Filed Weights   11/08/14 0400 11/09/14 0400  Weight: 126.554 kg (279 lb) 125.283 kg (276 lb 3.2 oz)    Exam:   General:  Severely obese female sitting up in bed in some visible pain.  Cardiovascular: RRR, no murmurs, rubs, or gallops.  Tachycardic.  Respiratory: CBL.  No wheezing. BL air movement.    Abdomen: Obese/soft.  Non tender. Normoactive BS.  Musculoskeletal: 1+ LE edema.  No deformities noted.    Data Reviewed: Basic Metabolic Panel:  Recent Labs Lab 11/08/14 0425 11/09/14 0310  NA 135 135  K 4.0 4.7  CL 104 104  CO2 23 22  GLUCOSE 151* 132*  BUN 5* 7  CREATININE 0.79 0.66  CALCIUM 9.1 8.5*   Liver Function Tests:  Recent Labs Lab 11/09/14 0310  AST 57*  ALT 11*  ALKPHOS 75  BILITOT 1.0  PROT 6.1*  ALBUMIN 3.1*   No results for input(s): LIPASE, AMYLASE in the last 168 hours. No results for input(s): AMMONIA in the last 168 hours. CBC:  Recent Labs Lab 11/08/14 0425 11/08/14 1645 11/09/14 0310  WBC 22.9* 15.2* 16.2*  NEUTROABS 19.7*  --   --  HGB 9.7* 9.2* 9.0*  HCT 34.6* 33.5* 32.8*  MCV 70.2* 69.4* 69.6*  PLT 281 260 266   Cardiac Enzymes:  Recent Labs Lab 11/08/14 0425 11/08/14 1013 11/08/14 1515 11/08/14 2105  TROPONINI 0.15* 0.12* 0.11* 0.10*   BNP (last 3 results)  Recent Labs  09/08/14 1345  BNP 192.8*    ProBNP (last 3 results) No results for input(s): PROBNP in the last 8760 hours.  CBG: No results for input(s): GLUCAP in the last 168 hours.  Recent Results (from the past 240 hour(s))  MRSA PCR Screening     Status: None   Collection Time:  11/08/14  2:34 PM  Result Value Ref Range Status   MRSA by PCR NEGATIVE NEGATIVE Final    Comment:        The GeneXpert MRSA Assay (FDA approved for NASAL specimens only), is one component of a comprehensive MRSA colonization surveillance program. It is not intended to diagnose MRSA infection nor to guide or monitor treatment for MRSA infections.      Studies: Dg Chest 2 View  11/08/2014   CLINICAL DATA:  Mid chest pain and shortness of breath beginning tonight  EXAM: CHEST  2 VIEW  COMPARISON:  Chest radiograph September 08, 2014  FINDINGS: The cardiac silhouette appears upper limits of normal in size, mediastinal silhouette is nonsuspicious. RIGHT middle lobe scarring, no pleural effusion or focal consolidation. No pneumothorax. Soft tissue planes and included osseous structures are nonsuspicious. Mild degenerative change of thoracic spine.  IMPRESSION: No acute cardiopulmonary process ; stable chest radiograph from September 08, 2014.   Electronically Signed   By: Elon Alas   On: 11/08/2014 04:47   Ct Angio Chest Pe W/cm &/or Wo Cm  11/08/2014   CLINICAL DATA:  Acute chest pain.  EXAM: CT ANGIOGRAPHY CHEST WITH CONTRAST  TECHNIQUE: Multidetector CT imaging of the chest was performed using the standard protocol during bolus administration of intravenous contrast. Multiplanar CT image reconstructions and MIPs were obtained to evaluate the vascular anatomy.  CONTRAST:  80mL OMNIPAQUE IOHEXOL 350 MG/ML SOLN  COMPARISON:  Chest radiograph of same day.  FINDINGS: No pneumothorax or pleural effusion is noted. Probable scarring or subsegmental atelectasis is noted posteriorly in the right lung base. There is no evidence of thoracic aortic dissection or aneurysm. Low densities are noted in both thyroid lobes. Large bilateral pulmonary emboli are noted, most prominently seen in the lower lobe branches of the right pulmonary artery. The RV/ LV ratio is calculated at 1.4 suggesting right heart strain. No  mediastinal mass or adenopathy is noted.  Within the visualized portion of the abdomen, 5.2 cm low density mass with irregular calcifications and possible peripheral solid components is noted most consistent with adrenal mass. No significant osseous abnormality is noted.  Review of the MIP images confirms the above findings.  IMPRESSION: Large bilateral acute pulmonary emboli are noted with CT evidence of right heart strain (RV/LV Ratio = 1.4) consistent with at least submassive (intermediate risk) PE. The presence of right heart strain has been associated with an increased risk of morbidity and mortality. Please activate Code PE by paging [phone].  Bilateral low densities are noted in the thyroid lobes ; thyroid ultrasound is recommended for further evaluation.  5.2 cm probable right adrenal mass is noted with irregular calcifications and possible peripheral solid components. MRI is recommended for further evaluation on nonemergent basis.  Critical Value/emergent results were called by telephone at the time of interpretation on 11/08/2014 at 9:23 am  to Dr. Roderic Palau, who verbally acknowledged these results.   Electronically Signed   By: Marijo Conception, M.D.   On: 11/08/2014 09:24    Scheduled Meds: . aspirin EC  81 mg Oral Daily  . ferrous sulfate  325 mg Oral BID WC  . gabapentin  100 mg Oral BID  . nicotine  14 mg Transdermal Daily  . sodium chloride  3 mL Intravenous Q12H   Continuous Infusions: . sodium chloride 10 mL/hr at 11/08/14 1016  . heparin 1,850 Units/hr (11/09/14 0455)        Time spent: 35 minutes    Marlou Starks [facility] PA-S  [facility] Pager [phone]. If 7PM-7AM, please contact night-coverage at [email or web address], password [facility] 11/09/2014, 11:35 AM  LOS: 1 day       Addendum  Patient seen and examined, chart and data base reviewed.  I agree with the above assessment and plan.  For full details please see Mr. Marlou Starks [facility] PA-S note.  I reviewed  and amended the above note as appropriate.   Birdie Hopes, MD [facility] Pager: [phone] 11/09/2014, 3:05 PM

## 2014-11-09 NOTE — Progress Notes (Signed)
  Echocardiogram 2D Echocardiogram with Definity has been performed.  Tresa Res 11/09/2014, 11:08 AM

## 2014-11-09 NOTE — Plan of Care (Signed)
Problem: Consults Goal: Diagnosis - Venous Thromboembolism (VTE) Choose a selection Outcome: Completed/Met Date Met:  11/09/14 PE (Pulmonary Embolism)     

## 2014-11-09 NOTE — Progress Notes (Signed)
ANTICOAGULATION CONSULT NOTE - Follow Up Consult  Pharmacy Consult for Heparin  Indication: pulmonary embolus and DVT  Allergies  Allergen Reactions  . Penicillins Shortness Of Breath  . Rocephin [Ceftriaxone Sodium In Dextrose] Anaphylaxis  . Shellfish Allergy Anaphylaxis  . Citrus Rash    Patient Measurements: Height: 5\' 9"  (175.3 cm) Weight: 279 lb (126.554 kg) IBW/kg (Calculated) : 66.2  Vital Signs: Temp: 99.8 F (37.7 C) (05/11 0026) Temp Source: Oral (05/11 0026) BP: 114/70 mmHg (05/11 0026) Pulse Rate: 120 (05/11 0026)  Labs:  Recent Labs  11/08/14 0425 11/08/14 1013 11/08/14 1515 11/08/14 1645 11/08/14 2105 11/09/14 0310  HGB 9.7*  --   --  9.2*  --   --   HCT 34.6*  --   --  33.5*  --   --   PLT 281  --   --  260  --   --   HEPARINUNFRC  --   --  0.49  --  0.37 0.23*  CREATININE 0.79  --   --   --   --   --   TROPONINI 0.15* 0.12* 0.11*  --  0.10*  --     Estimated Creatinine Clearance: 125.4 mL/min (by C-G formula based on Cr of 0.79).  Assessment: 49 y/o F on heparin for PE/DVT, HL is sub-therapeutic at 0.23 (heparin was off for about 30 minutes about one hour before lab was drawn).   Goal of Therapy:  Heparin level 0.3-0.7 units/ml Monitor platelets by anticoagulation protocol: Yes   Plan:  -Increase heparin drip to 1850 units/hr (smaller increase with off time) -1300 HL -Daily CBC/HL -Monitor for bleeding  Narda Bonds 11/09/2014,4:43 AM

## 2014-11-09 NOTE — Progress Notes (Signed)
UR Completed Ora Mcnatt Graves-Bigelow, RN,BSN [phone]  

## 2014-11-09 NOTE — Care Management Note (Signed)
Case Management Note  Patient Details  Name: Melinda [name] MRN: [id number] Date of Birth: [birth year]  Subjective/Objective: Pt admitted for SOB. Positive for B PE and DVT. Initiated on IV heparin gtt.                    Action/Plan: No needs identified by CM at this time. Will continue to monitor.    Expected Discharge Date:                  Expected Discharge Plan:  Home/Self Care  In-House Referral:     Discharge planning Services  CM Consult  Post Acute Care Choice:    Choice offered to:     DME Arranged:    DME Agency:     HH Arranged:    [place] Agency:     Status of Service:     Medicare Important Message Given:    Date Medicare IM Given:    Medicare IM give by:    Date Additional Medicare IM Given:    Additional Medicare Important Message give by:     If discussed at [place] of Stay Meetings, dates discussed:    Additional Comments:  Bethena Roys, RN 11/09/2014, 1:10 PM

## 2014-11-09 NOTE — Progress Notes (Signed)
ANTICOAGULATION CONSULT NOTE - Follow Up Consult  Pharmacy Consult for Heparin Indication: pulmonary embolus and DVT  Allergies  Allergen Reactions  . Penicillins Shortness Of Breath  . Rocephin [Ceftriaxone Sodium In Dextrose] Anaphylaxis  . Shellfish Allergy Anaphylaxis  . Citrus Rash    Patient Measurements: Height: 5\' 9"  (175.3 cm) Weight: 276 lb 3.2 oz (125.283 kg) IBW/kg (Calculated) : 66.2 Heparin Dosing Weight: ~95kg  Vital Signs: Temp: 99.2 F (37.3 C) (05/11 0400) Temp Source: Oral (05/11 0400) BP: 127/84 mmHg (05/11 1230) Pulse Rate: 122 (05/11 1230)  Labs:  Recent Labs  11/08/14 0425 11/08/14 1013  11/08/14 1515 11/08/14 1645 11/08/14 2105 11/09/14 0310 11/09/14 1200  HGB 9.7*  --   --   --  9.2*  --  9.0*  --   HCT 34.6*  --   --   --  33.5*  --  32.8*  --   PLT 281  --   --   --  260  --  266  --   HEPARINUNFRC  --   --   < > 0.49  --  0.37 0.23* 0.33  CREATININE 0.79  --   --   --   --   --  0.66  --   TROPONINI 0.15* 0.12*  --  0.11*  --  0.10*  --   --   < > = values in this interval not displayed.  Estimated Creatinine Clearance: 124.6 mL/min (by C-G formula based on Cr of 0.66).   Medications:  Heparin @ 1850 units/hr  Assessment: 48yof continues on heparin for bilateral PE (per CT 5/10) and RLE DVT (per doppler 5/10). Heparin level is now therapeutic after rate increase this morning. Hemoglobin is low but stable, platelets stable. No bleeding reported.  Goal of Therapy:  Heparin level 0.3-0.7 units/ml Monitor platelets by anticoagulation protocol: Yes   Plan:  1) Increase heparin slightly to 1900 units to maintain therapeutic 2) Follow up heparin level, CBC in AM  Deboraha Sprang 11/09/2014,1:24 PM

## 2014-11-10 DIAGNOSIS — N390 Urinary tract infection, site not specified: Secondary | ICD-10-CM | POA: Diagnosis present

## 2014-11-10 LAB — CBC
HCT: 32 % — ABNORMAL LOW (ref 36.0–46.0)
Hemoglobin: 8.7 g/dL — ABNORMAL LOW (ref 12.0–15.0)
MCH: 19.1 pg — ABNORMAL LOW (ref 26.0–34.0)
MCHC: 27.2 g/dL — ABNORMAL LOW (ref 30.0–36.0)
MCV: 70.2 fL — AB (ref 78.0–100.0)
Platelets: 316 10*3/uL (ref 150–400)
RBC: 4.56 MIL/uL (ref 3.87–5.11)
RDW: 25.4 % — ABNORMAL HIGH (ref 11.5–15.5)
WBC: 17.3 10*3/uL — ABNORMAL HIGH (ref 4.0–10.5)

## 2014-11-10 LAB — HEPARIN LEVEL (UNFRACTIONATED): Heparin Unfractionated: 0.31 IU/mL (ref 0.30–0.70)

## 2014-11-10 MED ORDER — HEPARIN (PORCINE) IN NACL 100-0.45 UNIT/ML-% IJ SOLN
2150.0000 [IU]/h | INTRAMUSCULAR | Status: DC
  Administered 2014-11-10: 1950 [IU]/h via INTRAVENOUS
  Administered 2014-11-11: 2150 [IU]/h via INTRAVENOUS
  Filled 2014-11-10 (×2): qty 250

## 2014-11-10 NOTE — Progress Notes (Signed)
ANTICOAGULATION CONSULT NOTE - Follow Up Consult  Pharmacy Consult for Heparin Indication: pulmonary embolus and DVT  Allergies  Allergen Reactions  . Penicillins Shortness Of Breath  . Rocephin [Ceftriaxone Sodium In Dextrose] Anaphylaxis  . Shellfish Allergy Anaphylaxis  . Citrus Rash    Patient Measurements: Height: 5\' 9"  (175.3 cm) Weight: 278 lb 11.2 oz (126.417 kg) IBW/kg (Calculated) : 66.2 Heparin Dosing Weight: ~95kg  Vital Signs: Temp: 98.2 F (36.8 C) (05/12 0753) Temp Source: Axillary (05/12 0753) BP: 128/81 mmHg (05/12 0753) Pulse Rate: 112 (05/12 0753)  Labs:  Recent Labs  11/08/14 0425 11/08/14 1013  11/08/14 1515 11/08/14 1645 11/08/14 2105 11/09/14 0310 11/09/14 1200 11/10/14 0350  HGB 9.7*  --   --   --  9.2*  --  9.0*  --  8.7*  HCT 34.6*  --   --   --  33.5*  --  32.8*  --  32.0*  PLT 281  --   --   --  260  --  266  --  316  HEPARINUNFRC  --   --   < > 0.49  --  0.37 0.23* 0.33 0.31  CREATININE 0.79  --   --   --   --   --  0.66  --   --   TROPONINI 0.15* 0.12*  --  0.11*  --  0.10*  --   --   --   < > = values in this interval not displayed.  Estimated Creatinine Clearance: 125.3 mL/min (by C-G formula based on Cr of 0.66).   Medications:  Heparin @ 1900 units/hr  Assessment: 48yof continues on heparin for bilateral PE (per CT 5/10) and RLE DVT (per doppler 5/10). Heparin level is therapeutic on the lower end of goal. Hemoglobin is low but stable, platelets stable. No bleeding reported.  Goal of Therapy:  Heparin level 0.3-0.7 units/ml Monitor platelets by anticoagulation protocol: Yes   Plan:  1) Increase heparin slightly to 1950 units to maintain therapeutic 2) Follow up heparin level, CBC in AM 3) Follow up transition to eliquis 5/13  Deboraha Sprang 11/10/2014,10:28 AM

## 2014-11-10 NOTE — Progress Notes (Addendum)
[facility] PROGRESS NOTE  Melinda [name] OVF:[id number] DOB: 11-Jul-1965 DOA: 11/08/2014 PCP: Lorayne Marek, MD   HPI/Subjective: Feeling better today, denies any fever or chills. Still has some right-sided pleuritic chest pain on deep inspiration or cough.    Assessment/Plan:   Acute pulmonary embolism/DVT:   - Patient presented to ER with SOB and and chest pain.  D-Dimer positive with CT showing bilateral pulmonary embolism.  -etiology not certain, she is obese, history of tobacco use. -Spoke with Dr. Ruthann Cancer, she had normal endometrium on biopsy. -Pulmonology consulted, recommended no thrombolysis. -Continue heparin for today, likely to switch to Eliquis in the morning, if no symptoms can be discharged.  UTI -Patient had strong smelling urine, urinalysis consistent with UTI. -Started on ciprofloxacin, culture pending.    Menorrhagia/fibroids:   -Patient has a history of vaginal bleeding with a biopsy done on an ovarian mass on 11/01/14.  -Discussed with Dr. Ruthann Cancer her OB/GYN, biopsy showed normal endometrium. -Outpatient follow up after discharge    Iron deficiency anemia due to chronic blood loss: -Likely due to blood loss from ovarian mass -Taking iron tablets and reports no constipation  -Hemoglobin has increased from 7.7 (09/08/14) to 9.0 today -Follow closely    Chest pain -Likely due to PE, however, troponin elevated likely due to heart strain also from PE -Repeat EKG's with no ST elevation, troponin is downtrending -awaiting results from echo -Troponin elevation is likely secondary to the PE and the RV strain secondary to the submassive PE.    Sinus tachycardia -Will likely resolve upon treatment of PE -likely secondary to the PE and the UTI, metoprolol as needed.  Tobacco abuse:   -cigarette smoker -nicotine patch -smoking cessation   Code Status: Full Code  Family Communication: No family present. Plan discussed with patient.  Disposition Plan:  Likely  for D/C in AM   Consultants:  Pharmacy, Pulmonology   Procedures:  Echo today  Antibiotics:  None   Objective: Filed Vitals:   11/10/14 1146  BP: 120/82  Pulse: 111  Temp: 98.5 F (36.9 C)  Resp: 20    Intake/Output Summary (Last 24 hours) at 11/10/14 1442 Last data filed at 11/10/14 0900  Gross per 24 hour  Intake    533 ml  Output   1750 ml  Net  -1217 ml   Filed Weights   11/08/14 0400 11/09/14 0400 11/10/14 0400  Weight: 126.554 kg (279 lb) 125.283 kg (276 lb 3.2 oz) 126.417 kg (278 lb 11.2 oz)    Exam:   General:  Severely obese female sitting up in bed in some visible pain.  Cardiovascular: RRR, no murmurs, rubs, or gallops.  Tachycardic.  Respiratory: CBL.  No wheezing. BL air movement.    Abdomen: Obese/soft.  Non tender. Normoactive BS.  Musculoskeletal: 1+ LE edema.  No deformities noted.    Data Reviewed: Basic Metabolic Panel:  Recent Labs Lab 11/08/14 0425 11/09/14 0310  NA 135 135  K 4.0 4.7  CL 104 104  CO2 23 22  GLUCOSE 151* 132*  BUN 5* 7  CREATININE 0.79 0.66  CALCIUM 9.1 8.5*   Liver Function Tests:  Recent Labs Lab 11/09/14 0310  AST 57*  ALT 11*  ALKPHOS 75  BILITOT 1.0  PROT 6.1*  ALBUMIN 3.1*   No results for input(s): LIPASE, AMYLASE in the last 168 hours. No results for input(s): AMMONIA in the last 168 hours. CBC:  Recent Labs Lab 11/08/14 0425 11/08/14 1645 11/09/14 0310 11/10/14 0350  WBC 22.9*  15.2* 16.2* 17.3*  NEUTROABS 19.7*  --   --   --   HGB 9.7* 9.2* 9.0* 8.7*  HCT 34.6* 33.5* 32.8* 32.0*  MCV 70.2* 69.4* 69.6* 70.2*  PLT 281 260 266 316   Cardiac Enzymes:  Recent Labs Lab 11/08/14 0425 11/08/14 1013 11/08/14 1515 11/08/14 2105  TROPONINI 0.15* 0.12* 0.11* 0.10*   BNP (last 3 results)  Recent Labs  09/08/14 1345  BNP 192.8*    ProBNP (last 3 results) No results for input(s): PROBNP in the last 8760 hours.  CBG: No results for input(s): GLUCAP in the last 168  hours.  Recent Results (from the past 240 hour(s))  MRSA PCR Screening     Status: None   Collection Time: 11/08/14  2:34 PM  Result Value Ref Range Status   MRSA by PCR NEGATIVE NEGATIVE Final    Comment:        The GeneXpert MRSA Assay (FDA approved for NASAL specimens only), is one component of a comprehensive MRSA colonization surveillance program. It is not intended to diagnose MRSA infection nor to guide or monitor treatment for MRSA infections.      Studies: No results found.  Scheduled Meds: . antiseptic oral rinse  7 mL Mouth Rinse BID  . aspirin EC  81 mg Oral Daily  . ciprofloxacin  400 mg Intravenous Q12H  . ferrous sulfate  325 mg Oral BID WC  . gabapentin  100 mg Oral BID  . nicotine  14 mg Transdermal Daily  . sodium chloride  3 mL Intravenous Q12H   Continuous Infusions: . sodium chloride 10 mL/hr at 11/08/14 1016  . heparin 1,950 Units/hr (11/10/14 0839)        Time spent: 35 minutes    [facility] Pager [phone]. If 7PM-7AM, please contact night-coverage at [email or web address], password [facility] 11/10/2014, 2:42 PM  LOS: 2 days

## 2014-11-11 DIAGNOSIS — N390 Urinary tract infection, site not specified: Secondary | ICD-10-CM

## 2014-11-11 LAB — BASIC METABOLIC PANEL
ANION GAP: 10 (ref 5–15)
CO2: 24 mmol/L (ref 22–32)
Calcium: 8.4 mg/dL — ABNORMAL LOW (ref 8.9–10.3)
Chloride: 99 mmol/L — ABNORMAL LOW (ref 101–111)
Creatinine, Ser: 0.56 mg/dL (ref 0.44–1.00)
GFR calc Af Amer: >60 mL/min (ref >60)
Glucose, Bld: 132 mg/dL — ABNORMAL HIGH (ref 65–99)
Potassium: 3.8 mmol/L (ref 3.5–5.1)
Sodium: 133 mmol/L — ABNORMAL LOW (ref 135–145)

## 2014-11-11 LAB — CBC
HCT: 31.3 % — ABNORMAL LOW (ref 36.0–46.0)
HEMOGLOBIN: 8.5 g/dL — AB (ref 12.0–15.0)
MCH: 19 pg — ABNORMAL LOW (ref 26.0–34.0)
MCHC: 27.2 g/dL — AB (ref 30.0–36.0)
MCV: 70 fL — ABNORMAL LOW (ref 78.0–100.0)
Platelets: 336 10*3/uL (ref 150–400)
RBC: 4.47 MIL/uL (ref 3.87–5.11)
RDW: 24.8 % — ABNORMAL HIGH (ref 11.5–15.5)
WBC: 13.5 10*3/uL — ABNORMAL HIGH (ref 4.0–10.5)

## 2014-11-11 LAB — HEPARIN LEVEL (UNFRACTIONATED): Heparin Unfractionated: 0.23 IU/mL — ABNORMAL LOW (ref 0.30–0.70)

## 2014-11-11 MED ORDER — APIXABAN 5 MG PO TABS: 10.0000 mg | ORAL_TABLET | Freq: Two times a day (BID) | ORAL | Status: DC

## 2014-11-11 MED ORDER — APIXABAN 5 MG PO TABS: 5.0000 mg | ORAL_TABLET | Freq: Two times a day (BID) | ORAL | Status: DC

## 2014-11-11 MED ORDER — APIXABAN 5 MG PO TABS
10.0000 mg | ORAL_TABLET | Freq: Two times a day (BID) | ORAL | Status: DC
  Administered 2014-11-11: 10 mg via ORAL
  Filled 2014-11-11: qty 2

## 2014-11-11 MED ORDER — CIPROFLOXACIN HCL 500 MG PO TABS: 500.0000 mg | ORAL_TABLET | Freq: Two times a day (BID) | ORAL | Status: DC

## 2014-11-11 NOTE — Progress Notes (Signed)
CSW (Clinical Education officer, museum) notified pt having issues with transportation home. Pt typically uses Medicaid transportation in the community. Both pt and CSW attempted to arrange pick up for today without success. Per nursing, pt unsafe to travel by bus. Pt reports having no other funds available. CSW provided pt with taxi voucher to confirmed address. Pt with no further hospital social work needs.  [place], [place]

## 2014-11-11 NOTE — Progress Notes (Signed)
ANTICOAGULATION CONSULT NOTE - Follow Up Consult  Pharmacy Consult for Heparin  Indication: pulmonary embolus and DVT  Allergies  Allergen Reactions  . Penicillins Shortness Of Breath  . Rocephin [Ceftriaxone Sodium In Dextrose] Anaphylaxis  . Shellfish Allergy Anaphylaxis  . Citrus Rash    Patient Measurements: Height: 5\' 9"  (175.3 cm) Weight: 278 lb 11.2 oz (126.417 kg) IBW/kg (Calculated) : 66.2  Vital Signs: Temp: 98.9 F (37.2 C) (05/12 2100) Temp Source: Oral (05/12 2100) BP: 115/74 mmHg (05/12 2100) Pulse Rate: 116 (05/12 2100)  Labs:  Recent Labs  11/08/14 0425 11/08/14 1013  11/08/14 1515  11/08/14 2105 11/09/14 0310 11/09/14 1200 11/10/14 0350 11/11/14 0312  HGB 9.7*  --   --   --   < >  --  9.0*  --  8.7* 8.5*  HCT 34.6*  --   --   --   < >  --  32.8*  --  32.0* 31.3*  PLT 281  --   --   --   < >  --  266  --  316 336  HEPARINUNFRC  --   --   < > 0.49  --  0.37 0.23* 0.33 0.31 0.23*  CREATININE 0.79  --   --   --   --   --  0.66  --   --   --   TROPONINI 0.15* 0.12*  --  0.11*  --  0.10*  --   --   --   --   < > = values in this interval not displayed.  Estimated Creatinine Clearance: 125.3 mL/min (by C-G formula based on Cr of 0.66).  Assessment: 49 y/o F on heparin for PE/DVT, HL is sub-therapeutic at 0.23, no issues per RN.  Goal of Therapy:  Heparin level 0.3-0.7 units/ml Monitor platelets by anticoagulation protocol: Yes   Plan:  -Increase heparin drip to 2150 units/hr -1100 HL, although may switch to Apixaban this AM -Daily CBC/HL -Monitor for bleeding  Narda Bonds 11/11/2014,4:09 AM

## 2014-11-11 NOTE — Discharge Instructions (Addendum)
Information on my medicine - ELIQUIS (apixaban)  This medication education was reviewed with me or my healthcare representative as part of my discharge preparation.  The pharmacist that spoke with me during my hospital stay was:  Deboraha Sprang, [facility]  Why was Eliquis prescribed for you? Eliquis was prescribed to treat blood clots that may have been found in the veins of your legs (deep vein thrombosis) or in your lungs (pulmonary embolism) and to reduce the risk of them occurring again.  What do You need to know about Eliquis ? The starting dose is 10 mg (two 5 mg tablets) taken TWICE daily for the FIRST SEVEN (7) DAYS, then on 11/18/14 the dose is reduced to ONE 5 mg tablet taken TWICE daily.  Eliquis may be taken with or without food.   Try to take the dose about the same time in the morning and in the evening. If you have difficulty swallowing the tablet whole please discuss with your pharmacist how to take the medication safely.  Take Eliquis exactly as prescribed and DO NOT stop taking Eliquis without talking to the doctor who prescribed the medication.  Stopping may increase your risk of developing a new blood clot.  Refill your prescription before you run out.  After discharge, you should have regular check-up appointments with your healthcare provider that is prescribing your Eliquis.    What do you do if you miss a dose? If a dose of ELIQUIS is not taken at the scheduled time, take it as soon as possible on the same day and twice-daily administration should be resumed. The dose should not be doubled to make up for a missed dose.  Important Safety Information A possible side effect of Eliquis is bleeding. You should call your healthcare provider right away if you experience any of the following: ? Bleeding from an injury or your nose that does not stop. ? Unusual colored urine (red or dark brown) or unusual colored stools (red or black). ? Unusual bruising for unknown  reasons. ? A serious fall or if you hit your head (even if there is no bleeding).  Some medicines may interact with Eliquis and might increase your risk of bleeding or clotting while on Eliquis. To help avoid this, consult your healthcare provider or pharmacist prior to using any new prescription or non-prescription medications, including herbals, vitamins, non-steroidal anti-inflammatory drugs (NSAIDs) and supplements.  This website has more information on Eliquis (apixaban): http://www.eliquis.com/eliquis/home   Pulmonary Embolism A pulmonary (lung) embolism (PE) is a blood clot that has traveled to the lung and results in a blockage of blood flow in the affected lung. Most clots come from deep veins in the legs or pelvis. PE is a dangerous and potentially life-threatening condition that can be treated if identified. CAUSES Blood clots form in a vein for different reasons. Usually several things cause blood clots. They include:  The flow of blood slows down.  The inside of the vein is damaged in some way.  The person has a condition that makes the blood clot more easily. RISK FACTORS Some people are more likely than others to develop PE. Risk factors include:   Smoking.  Being overweight (obese).  Sitting or lying still for a long time. This includes long-distance travel, paralysis, or recovery from an illness or surgery. Other factors that increase risk are:   Older age, especially over 41 years of age.  Having a family history of blood clots or if you have already had a  blood clot.  Having major or lengthy surgery. This is especially true for surgery on the hip, knee, or belly (abdomen). Hip surgery is particularly high risk.  Having a long, thin tube (catheter) placed inside a vein during a medical procedure.  Breaking a hip or leg.  Having cancer or cancer treatment.  Medicines containing the female hormone estrogen. This includes birth control pills and hormone  replacement therapy.  Other circulation or heart problems.  Pregnancy and childbirth.  Hormone changes make the blood clot more easily during pregnancy.  The fetus puts pressure on the veins of the pelvis.  There is a risk of injury to veins during delivery or a caesarean delivery. The risk is highest just after childbirth.  PREVENTION   Exercise the legs regularly. Take a brisk 30 minute walk every day.  Maintain a weight that is appropriate for your height.  Avoid sitting or lying in bed for long periods of time without moving your legs.  Women, particularly those over the age of 22 years, should consider the risks and benefits of taking estrogen medicines, including birth control pills.  Do not smoke, especially if you take estrogen medicines.  Long-distance travel can increase your risk. You should exercise your legs by walking or pumping the muscles every hour.  Many of the risk factors above relate to situations that exist with hospitalization, either for illness, injury, or elective surgery. Prevention may include medical and nonmedical measures.   Your health care provider will assess you for the need for venous thromboembolism prevention when you are admitted to the hospital. If you are having surgery, your surgeon will assess you the day of or day after surgery.  SYMPTOMS  The symptoms of a PE usually start suddenly and include:  Shortness of breath.  Coughing.  Coughing up blood or blood-tinged mucus.  Chest pain. Pain is often worse with deep breaths.  Rapid heartbeat. DIAGNOSIS  If a PE is suspected, your health care provider will take a medical history and perform a physical exam. Other tests that may be required include:  Blood tests, such as studies of the clotting properties of your blood.  Imaging tests, such as ultrasound, CT, MRI, and other tests to see if you have clots in your legs or lungs.  An electrocardiogram. This can look for heart strain  from blood clots in the lungs. TREATMENT   The most common treatment for a PE is blood thinning (anticoagulant) medicine, which reduces the blood's tendency to clot. Anticoagulants can stop new blood clots from forming and old clots from growing. They cannot dissolve existing clots. Your body does this by itself over time. Anticoagulants can be given by mouth, through an intravenous (IV) tube, or by injection. Your health care provider will determine the best program for you.  Less commonly, clot-dissolving medicines (thrombolytics) are used to dissolve a PE. They carry a high risk of bleeding, so they are used mainly in severe cases.  Very rarely, a blood clot in the leg needs to be removed surgically.  If you are unable to take anticoagulants, your health care provider may arrange for you to have a filter placed in a main vein in your abdomen. This filter prevents clots from traveling to your lungs. HOME CARE INSTRUCTIONS   Take all medicines as directed by your health care provider.  Learn as much as you can about DVT.  Wear a medical alert bracelet or carry a medical alert card.  Ask your health care provider  how soon you can go back to normal activities. It is important to stay active to prevent blood clots. If you are on anticoagulant medicine, avoid contact sports.  It is very important to exercise. This is especially important while traveling, sitting, or standing for long periods of time. Exercise your legs by walking or by tightening and relaxing your leg muscles regularly. Take frequent walks.  You may need to wear compression stockings. These are tight elastic stockings that apply pressure to the lower legs. This pressure can help keep the blood in the legs from clotting. Taking Warfarin Warfarin is a daily medicine that is taken by mouth. Your health care provider will advise you on the length of treatment (usually 3-6 months, sometimes lifelong). If you take  warfarin:  Understand how to take warfarin and foods that can affect how warfarin works in Veterinary surgeon.  Too much and too little warfarin are both dangerous. Too much warfarin increases the risk of bleeding. Too little warfarin continues to allow the risk for blood clots. Warfarin and Regular Blood Testing While taking warfarin, you will need to have regular blood tests to measure your blood clotting time. These blood tests usually include both the prothrombin time (PT) and international normalized ratio (INR) tests. The PT and INR results allow your health care provider to adjust your dose of warfarin. It is very important that you have your PT and INR tested as often as directed by your health care provider.  Warfarin and Your Diet Avoid major changes in your diet, or notify your health care provider before changing your diet. Arrange a visit with a registered dietitian to answer your questions. Many foods, especially foods high in vitamin K, can interfere with warfarin and affect the PT and INR results. You should eat a consistent amount of foods high in vitamin K. Foods high in vitamin K include:   Spinach, kale, broccoli, cabbage, collard and turnip greens, Brussels sprouts, peas, cauliflower, seaweed, and parsley.  Beef and pork liver.  Green tea.  Soybean oil. Warfarin with Other Medicines Many medicines can interfere with warfarin and affect the PT and INR results. You must:  Tell your health care provider about any and all medicines, vitamins, and supplements you take, including aspirin and other over-the-counter anti-inflammatory medicines. Be especially cautious with aspirin and anti-inflammatory medicines. Ask your health care provider before taking these.  Do not take or discontinue any prescribed or over-the-counter medicine except on the advice of your health care provider or pharmacist. Warfarin Side Effects Warfarin can have side effects, such as easy bruising and difficulty  stopping bleeding. Ask your health care provider or pharmacist about other side effects of warfarin. You will need to:  Hold pressure over cuts for longer than usual.  Notify your dentist and other health care providers that you are taking warfarin before you undergo any procedures where bleeding may occur. Warfarin with Alcohol and Tobacco   Drinking alcohol frequently can increase the effect of warfarin, leading to excess bleeding. It is best to avoid alcoholic drinks or consume only very small amounts while taking warfarin. Notify your health care provider if you change your alcohol intake.  Do not use any tobacco products including cigarettes, chewing tobacco, or electronic cigarettes. If you smoke, quit. Ask your health care provider for help with quitting smoking. Alternative Medicines to Warfarin: Factor Xa Inhibitor Medicines  These blood thinning medicines are taken by mouth, usually for several weeks or longer. It is important to take the  medicine every single day, at the same time each day.  There are no regular blood tests required when using these medicines.  There are fewer food and drug interactions than with warfarin.  The side effects of this class of medicine is similar to that of warfarin, including excessive bruising or bleeding. Ask your health care provider or pharmacist about other potential side effects. SEEK MEDICAL CARE IF:   You notice a rapid heartbeat.  You feel weaker or more tired than usual.  You feel faint.  You notice increased bruising.  Your symptoms are not getting better in the time expected.  You are having side effects of medicine. SEEK IMMEDIATE MEDICAL CARE IF:   You have chest pain.  You have trouble breathing.  You have new or increased swelling or pain in one leg.  You cough up blood.  You notice blood in vomit, in a bowel movement, or in urine.  You have a fever. Symptoms of PE may represent a serious problem that is an  emergency. Do not wait to see if the symptoms will go away. Get medical help right away. Call your local emergency services (911 in the [place]). Do not drive yourself to the hospital. Document Released: 06/14/2000 Document Revised: 11/01/2013 Document Reviewed: 06/28/2013 [facility] Patient Information 2015 [place], [place]. This information is not intended to replace advice given to you by your health care provider. Make sure you discuss any questions you have with your health care provider.

## 2014-11-11 NOTE — Progress Notes (Signed)
Pt in stable condition, d/c'd via wheelchair to cab with voucher. Pt denies pain or any other complaints. I reviewed d/c instructions and pt verbalized understanding with her scripts in hand.

## 2014-11-11 NOTE — Discharge Summary (Signed)
Physician Discharge Summary  Melinda [name] GYJ:[id number] DOB: [birth year] DOA: 11/08/2014  PCP: Lorayne Marek, MD  Admit date: 11/08/2014 Discharge date: 11/11/2014  Time spent: 40 minutes  Recommendations for Outpatient Follow-up:  1. Follow-up with primary care physician within one week.  Discharge Diagnoses:  Principal Problem:   Acute pulmonary embolism Active Problems:   Menorrhagia   Iron deficiency anemia due to chronic blood loss   Fibroids   Chest pain   Sinus tachycardia   UTI (lower urinary tract infection)   Morbid obesity   Discharge Condition: Stable  Diet recommendation: Heart healthy diet  Filed Weights   11/09/14 0400 11/10/14 0400 11/11/14 0514  Weight: 125.283 kg (276 lb 3.2 oz) 126.417 kg (278 lb 11.2 oz) 126.009 kg (277 lb 12.8 oz)    History of present illness:  Melinda [name] is a 49 y.o. female with a past medical history of migraine headaches, iron deficiency anemia due to chronic blood loss from fibroids who was hospitalized in March for severe anemia and was transfused. She was referred to a gynecologist and underwent endometrial biopsy on May 3. Patient was in her usual state of health a few days ago when she started noticing a cough which was dry. Denies any hemoptysis. And then last night she developed chest pain which was in the central chest, as well as in the sides. She describes it as a pressure-like sensation and was 8 out of 10 in intensity. It would worsen with deep breathing and laying down. It reminded her of the pain that she had in the 90s when she was diagnosed with costochondritis. She was also wheezing last night and when EMS came, they give her a breathing treatment. Currently, the pain is 3 out of 10 in intensity. She had one episode of emesis last night, but denies any fever, chills, or abdominal pain. Denies any leg swelling. During her admission in March she was also diagnosed with fibroids and a right ovarian mass requiring follow-up. She  continues to have some vaginal spotting after her biopsy. However, it is decreasing in amount. Has a history of superficial vein thrombosis associated with pregnancy many years ago. But denies any history of heart disease or pulmonary embolism or DVTs in the past.  Hospital Course:    Acute pulmonary embolism/acute right femoral vein DVT:  - Patient presented to ER with SOB and and chest pain. D-Dimer positive with CT showing bilateral pulmonary embolism.  -Etiology not certain, she is obese, history of tobacco use. -Spoke with Dr. Ruthann Cancer, she had normal endometrium on biopsy. -Hypercoagulability panel was not drawn before anticoagulation is started, can be done as outpatient after anticoagulation stopped. -Pulmonology consulted, recommended no thrombolysis. -Heparin continued for 3 days, switched to Eliquis on day of discharge 10 mg twice a day for 7 more days then switch to 5 mg twice a day. Recommend longer period of anticoagulation at least six-month.  Escherichia coli UTI -Patient had strong smelling urine, urinalysis consistent with UTI. -Started on ciprofloxacin, culture pending on discharge, improving on the Cipro so she was discharged on it for 5 more days.   Menorrhagia/fibroids:  -Patient has a history of vaginal bleeding with a biopsy done on an ovarian mass on 11/01/14.  -Discussed with Dr. Ruthann Cancer her OB/GYN, biopsy showed normal endometrium. -Outpatient follow up after discharge   Iron deficiency anemia due to chronic blood loss: -Likely due to blood loss from fibroids -Taking iron tablets and reports no constipation  -Hemoglobin has increased from 7.7 (09/08/14)  to 8.5 on discharge.   Chest pain -Likely due to PE, however, troponin elevated likely due to heart strain from PE -Repeat EKG's with no ST elevation, troponin is downtrending -No motion abnormalities please look at the complete report below.   Sinus tachycardia -Will likely resolve upon treatment  of PE -likely secondary to the PE and the UTI, metoprolol as needed.  Tobacco abuse:  -cigarette smoker -nicotine patch -smoking cessation    Procedures:  2-D echo done on 11/09/2014. Study Conclusions  - Left ventricle: The cavity size was normal. Wall thickness was normal. Systolic function was mildly reduced. The estimated ejection fraction was = 50%. Mild diffuse hypokinesis with no identifiable regional variations. The study is not technically sufficient to allow evaluation of LV diastolic function. Right ventricle poorly visualized.  Consultations:  Pulmonology  Discharge Exam: Filed Vitals:   11/11/14 0514  BP: 129/86  Pulse: 116  Temp: 97.8 F (36.6 C)  Resp: 20   General: Alert and awake, oriented x3, obese African-American female, not in any acute distress. HEENT: anicteric sclera, pupils reactive to light and accommodation, EOMI CVS: S1-S2 clear, no murmur rubs or gallops Chest: clear to auscultation bilaterally, no wheezing, rales or rhonchi Abdomen: soft nontender, nondistended, normal bowel sounds, no organomegaly Extremities: no cyanosis, clubbing or edema noted bilaterally Neuro: Cranial nerves II-XII intact, no focal neurological deficits  Discharge Instructions   Discharge Instructions    Diet - low sodium heart healthy    Complete by:  As directed      Increase activity slowly    Complete by:  As directed           Current Discharge Medication List    START taking these medications   Details  apixaban (ELIQUIS) 5 MG TABS tablet Take 1 tablet (5 mg total) by mouth 2 (two) times daily. Take 10 mg (2 tablets) PO BID for 7 days, then 5 mg (1 tablet) PO BID Qty: 74 tablet, Refills: 0    ciprofloxacin (CIPRO) 500 MG tablet Take 1 tablet (500 mg total) by mouth 2 (two) times daily. Qty: 10 tablet, Refills: 0      CONTINUE these medications which have NOT CHANGED   Details  acetaminophen (TYLENOL) 500 MG tablet Take 500 mg by mouth  every 6 (six) hours as needed (patient takes 1-2 depending on pain level).    diphenhydrAMINE (BENADRYL) 25 MG tablet Take 50 mg by mouth at bedtime as needed for sleep (sleep).    ferrous sulfate 325 (65 FE) MG tablet Take 1 tablet (325 mg total) by mouth 2 (two) times daily with a meal. Qty: 60 tablet, Refills: 1    gabapentin (NEURONTIN) 100 MG capsule Take 1 capsule (100 mg total) by mouth 2 (two) times daily. Qty: 60 capsule, Refills: 1    Multiple Vitamins-Minerals (MULTIVITAMIN & MINERAL PO) Take 1 tablet by mouth daily.    SUMAtriptan (IMITREX) 50 MG tablet Take 1 tablet (50 mg total) by mouth every 2 (two) hours as needed for migraine or headache. May repeat in 2 hours if headache persists or recurs. Qty: 10 tablet, Refills: 0   Associated Diagnoses: Headache, unspecified headache type    butalbital-acetaminophen-caffeine (FIORICET/CODEINE) 50-325-40-30 MG per capsule Take 1 capsule by mouth every 4 (four) hours as needed for headache. Qty: 30 capsule, Refills: 0    HYDROcodone-acetaminophen (NORCO/VICODIN) 5-325 MG per tablet Take 1-2 tablets by mouth every 4 (four) hours as needed for moderate pain. Qty: 30 tablet, Refills: 0  Allergies  Allergen Reactions  . Penicillins Shortness Of Breath  . Rocephin [Ceftriaxone Sodium In Dextrose] Anaphylaxis  . Shellfish Allergy Anaphylaxis  . Citrus Rash   Follow-up Information    Follow up with Lorayne Marek, MD. Daphane Shepherd on 11/18/2014.   Specialty:  Internal Medicine   Why:  for hospital follow up @ 4:15pm, please arrive 15 minutes before schedule appointment time   Contact information:   [place] [address] [phone]        The results of significant diagnostics from this hospitalization (including imaging, microbiology, ancillary and laboratory) are listed below for reference.    Significant Diagnostic Studies: Dg Chest 2 View  11/08/2014   CLINICAL DATA:  Mid chest pain and shortness of  breath beginning tonight  EXAM: CHEST  2 VIEW  COMPARISON:  Chest radiograph September 08, 2014  FINDINGS: The cardiac silhouette appears upper limits of normal in size, mediastinal silhouette is nonsuspicious. RIGHT middle lobe scarring, no pleural effusion or focal consolidation. No pneumothorax. Soft tissue planes and included osseous structures are nonsuspicious. Mild degenerative change of thoracic spine.  IMPRESSION: No acute cardiopulmonary process ; stable chest radiograph from September 08, 2014.   Electronically Signed   By: Elon Alas   On: 11/08/2014 04:47   Ct Angio Chest Pe W/cm &/or Wo Cm  11/08/2014   CLINICAL DATA:  Acute chest pain.  EXAM: CT ANGIOGRAPHY CHEST WITH CONTRAST  TECHNIQUE: Multidetector CT imaging of the chest was performed using the standard protocol during bolus administration of intravenous contrast. Multiplanar CT image reconstructions and MIPs were obtained to evaluate the vascular anatomy.  CONTRAST:  86mL OMNIPAQUE IOHEXOL 350 MG/ML SOLN  COMPARISON:  Chest radiograph of same day.  FINDINGS: No pneumothorax or pleural effusion is noted. Probable scarring or subsegmental atelectasis is noted posteriorly in the right lung base. There is no evidence of thoracic aortic dissection or aneurysm. Low densities are noted in both thyroid lobes. Large bilateral pulmonary emboli are noted, most prominently seen in the lower lobe branches of the right pulmonary artery. The RV/ LV ratio is calculated at 1.4 suggesting right heart strain. No mediastinal mass or adenopathy is noted.  Within the visualized portion of the abdomen, 5.2 cm low density mass with irregular calcifications and possible peripheral solid components is noted most consistent with adrenal mass. No significant osseous abnormality is noted.  Review of the MIP images confirms the above findings.  IMPRESSION: Large bilateral acute pulmonary emboli are noted with CT evidence of right heart strain (RV/LV Ratio = 1.4) consistent  with at least submassive (intermediate risk) PE. The presence of right heart strain has been associated with an increased risk of morbidity and mortality. Please activate Code PE by paging [phone].  Bilateral low densities are noted in the thyroid lobes ; thyroid ultrasound is recommended for further evaluation.  5.2 cm probable right adrenal mass is noted with irregular calcifications and possible peripheral solid components. MRI is recommended for further evaluation on nonemergent basis.  Critical Value/emergent results were called by telephone at the time of interpretation on 11/08/2014 at 9:23 am to Dr. Roderic Palau, who verbally acknowledged these results.   Electronically Signed   By: Marijo Conception, M.D.   On: 11/08/2014 09:24    Microbiology: Recent Results (from the past 240 hour(s))  MRSA PCR Screening     Status: None   Collection Time: 11/08/14  2:34 PM  Result Value Ref Range Status   MRSA by PCR NEGATIVE  NEGATIVE Final    Comment:        The GeneXpert MRSA Assay (FDA approved for NASAL specimens only), is one component of a comprehensive MRSA colonization surveillance program. It is not intended to diagnose MRSA infection nor to guide or monitor treatment for MRSA infections.   Urine culture     Status: None (Preliminary result)   Collection Time: 11/09/14 12:54 PM  Result Value Ref Range Status   Specimen Description URINE, CLEAN CATCH  Final   Special Requests NONE  Final   Colony Count   Final    >=100,000 COLONIES/ML Performed at [facility]    Culture   Final    ESCHERICHIA COLI Performed at [facility]    Report Status PENDING  Incomplete     Labs: Basic Metabolic Panel:  Recent Labs Lab 11/08/14 0425 11/09/14 0310 11/11/14 0312  NA 135 135 133*  K 4.0 4.7 3.8  CL 104 104 99*  CO2 23 22 24   GLUCOSE 151* 132* 132*  BUN 5* 7 <5*  CREATININE 0.79 0.66 0.56  CALCIUM 9.1 8.5* 8.4*   Liver Function Tests:  Recent Labs Lab  11/09/14 0310  AST 57*  ALT 11*  ALKPHOS 75  BILITOT 1.0  PROT 6.1*  ALBUMIN 3.1*   No results for input(s): LIPASE, AMYLASE in the last 168 hours. No results for input(s): AMMONIA in the last 168 hours. CBC:  Recent Labs Lab 11/08/14 0425 11/08/14 1645 11/09/14 0310 11/10/14 0350 11/11/14 0312  WBC 22.9* 15.2* 16.2* 17.3* 13.5*  NEUTROABS 19.7*  --   --   --   --   HGB 9.7* 9.2* 9.0* 8.7* 8.5*  HCT 34.6* 33.5* 32.8* 32.0* 31.3*  MCV 70.2* 69.4* 69.6* 70.2* 70.0*  PLT 281 260 266 316 336   Cardiac Enzymes:  Recent Labs Lab 11/08/14 0425 11/08/14 1013 11/08/14 1515 11/08/14 2105  TROPONINI 0.15* 0.12* 0.11* 0.10*   BNP: BNP (last 3 results)  Recent Labs  09/08/14 1345  BNP 192.8*    ProBNP (last 3 results) No results for input(s): PROBNP in the last 8760 hours.  CBG: No results for input(s): GLUCAP in the last 168 hours.     Signed:  Vester Balthazor A  [facility] 11/11/2014, 12:40 PM

## 2014-11-11 NOTE — Clinical Documentation Improvement (Signed)
MD's, NP's, and,  PA's  Noted patient's BMI is 41.2 please provide a clinical condition if appropriate for this admission.  Thank you   Possible Clinical conditions  Obesity  Morbid Obesity  Other condition  Cannot clinically determine     Risk Factors:  Pulmonary Embolism, DVT, Asthma/ bronchitis   Treatment: evaluated   Thank You, Ree Kida ,RN Clinical Documentation Specialist:  [place] [facility]

## 2014-11-11 NOTE — Care Management Note (Addendum)
Case Management Note  Patient Details  Name: Melinda [name] MRN: [id number] Date of Birth: 18-Sep-1965  Subjective/Objective:   Pt admitted for PE. Plan for d/c on Eliquis.                  Action/Plan: CM will provide Eliquis card to pt for 30 day free. MD to write Rx for 30 day free supply. No further needs from CM at this time.   Expected Discharge Date:    11-11-14              Expected Discharge Plan:  Home/Self Care  In-House Referral:     Discharge planning Services  CM Consult  Post Acute Care Choice:    Choice offered to:     DME Arranged:    DME Agency:     HH Arranged:    HH Agency:     Status of Service:     Medicare Important Message Given:    Date Medicare IM Given:    Medicare IM give by:    Date Additional Medicare IM Given:    Additional Medicare Important Message give by:     If discussed at [place] of Stay Meetings, dates discussed:    Additional Comments: [employer] has medication available.   Bethena Roys, RN 11/11/2014, 10:59 AM

## 2014-11-12 LAB — URINE CULTURE

## 2014-11-14 ENCOUNTER — Other Ambulatory Visit: Payer: Self-pay | Admitting: Obstetrics

## 2014-11-14 DIAGNOSIS — D259 Leiomyoma of uterus, unspecified: Secondary | ICD-10-CM

## 2014-11-18 ENCOUNTER — Encounter: Payer: Self-pay | Admitting: Internal Medicine

## 2014-11-18 ENCOUNTER — Ambulatory Visit: Payer: Medicaid Other | Attending: Internal Medicine | Admitting: Internal Medicine

## 2014-11-18 VITALS — BP 135/87 | HR 93 | Temp 98.4°F | Resp 16 | Wt 276.0 lb

## 2014-11-18 DIAGNOSIS — I2699 Other pulmonary embolism without acute cor pulmonale: Secondary | ICD-10-CM

## 2014-11-18 DIAGNOSIS — D500 Iron deficiency anemia secondary to blood loss (chronic): Secondary | ICD-10-CM

## 2014-11-18 DIAGNOSIS — D259 Leiomyoma of uterus, unspecified: Secondary | ICD-10-CM

## 2014-11-18 DIAGNOSIS — I82401 Acute embolism and thrombosis of unspecified deep veins of right lower extremity: Secondary | ICD-10-CM | POA: Insufficient documentation

## 2014-11-18 DIAGNOSIS — N390 Urinary tract infection, site not specified: Secondary | ICD-10-CM | POA: Diagnosis not present

## 2014-11-18 MED ORDER — GABAPENTIN 100 MG PO CAPS: 100.0000 mg | ORAL_CAPSULE | Freq: Two times a day (BID) | ORAL | Status: DC

## 2014-11-18 MED ORDER — FERROUS SULFATE 325 (65 FE) MG PO TABS: 325.0000 mg | ORAL_TABLET | Freq: Two times a day (BID) | ORAL | Status: DC

## 2014-11-18 NOTE — Progress Notes (Signed)
Patient here for follow up from the hospital Patient was diagnosed with a pulmonary embolism and is currently Taking eliquis

## 2014-11-18 NOTE — Progress Notes (Signed)
MRN: [id number] Name: Melinda [name]  Sex: female Age: 49 y.o. DOB: [birth year]  Allergies: Penicillins; Rocephin; Shellfish allergy; and Citrus  Chief Complaint  Patient presents with  . Hospitalization Follow-up    HPI: Patient is 49 y.o. female who has history of fibroid chronic anemia was recently hospitalized with symptoms of chest pain, wheezing nausea vomiting, EMR reviewed patient had positive d-dimer with a CT scan of chest showing bilateral pulmonary embolism, patient also had acute right femoral vein DVT, since patient also has history of vaginal bleeding, GI and pulmonology was consulted was consulted, patient was subsequently discharged on Eliquis and was advised to continue for at least 6 months, she was also treated her for UTI, culture came back positive for Escherichia coli which was sensitive to Cipro , she was discharged and also advised to follow with GYN currently she takes iron. Patient currently denies any acute symptoms denies any urinary symptoms.  Past Medical History  Diagnosis Date  . Migraine   . PTSD (post-traumatic stress disorder)   . Asthma   . GERD (gastroesophageal reflux disease)   . Scoliosis   . Arthritis   . ADHD (attention deficit hyperactivity disorder)   . Peripheral neuropathy   . Iron deficiency anemia due to chronic blood loss 09/09/2014  . Hyperglycemia 09/09/2014  . Low TSH level 09/08/2014  . Menorrhagia 09/08/2014  . Ovarian mass, right 09/09/2014    Past Surgical History  Procedure Laterality Date  . Cholecystectomy        Medication List       This list is accurate as of: 11/18/14  4:48 PM.  Always use your most recent med list.               acetaminophen 500 MG tablet  Commonly known as:  TYLENOL  Take 500 mg by mouth every 6 (six) hours as needed (patient takes 1-2 depending on pain level).     apixaban 5 MG Tabs tablet  Commonly known as:  ELIQUIS  Take 1 tablet (5 mg total) by mouth 2 (two) times daily. Take 10 mg  (2 tablets) PO BID for 7 days, then 5 mg (1 tablet) PO BID     butalbital-acetaminophen-caffeine 50-325-40-30 MG per capsule  Commonly known as:  FIORICET/CODEINE  Take 1 capsule by mouth every 4 (four) hours as needed for headache.     ciprofloxacin 500 MG tablet  Commonly known as:  CIPRO  Take 1 tablet (500 mg total) by mouth 2 (two) times daily.     diphenhydrAMINE 25 MG tablet  Commonly known as:  BENADRYL  Take 50 mg by mouth at bedtime as needed for sleep (sleep).     ferrous sulfate 325 (65 FE) MG tablet  Take 1 tablet (325 mg total) by mouth 2 (two) times daily with a meal.     gabapentin 100 MG capsule  Commonly known as:  NEURONTIN  Take 1 capsule (100 mg total) by mouth 2 (two) times daily.     HYDROcodone-acetaminophen 5-325 MG per tablet  Commonly known as:  NORCO/VICODIN  Take 1-2 tablets by mouth every 4 (four) hours as needed for moderate pain.     MULTIVITAMIN & MINERAL PO  Take 1 tablet by mouth daily.     SUMAtriptan 50 MG tablet  Commonly known as:  IMITREX  Take 1 tablet (50 mg total) by mouth every 2 (two) hours as needed for migraine or headache. May repeat in 2 hours if headache persists or  recurs.        Meds ordered this encounter  Medications  . gabapentin (NEURONTIN) 100 MG capsule    Sig: Take 1 capsule (100 mg total) by mouth 2 (two) times daily.    Dispense:  60 capsule    Refill:  1  . ferrous sulfate 325 (65 FE) MG tablet    Sig: Take 1 tablet (325 mg total) by mouth 2 (two) times daily with a meal.    Dispense:  60 tablet    Refill:  1    Immunization History  Administered Date(s) Administered  . Influenza,inj,Quad PF,36+ Mos 09/09/2014  . Pneumococcal Polysaccharide-23 09/09/2014  . Pneumococcal-Unspecified 09/09/2014  . Tdap 10/28/2014    Family History  Problem Relation Age of Onset  . Diabetes Mother   . Breast cancer Mother   . CAD Mother   . Hypertension Mother   . Alcohol abuse Father   . Hypertension Father      History  Substance Use Topics  . Smoking status: Former Smoker -- 0.50 packs/day for 40 years    Types: Cigarettes    Quit date: 11/03/2014  . Smokeless tobacco: Never Used  . Alcohol Use: No    Review of Systems   As noted in HPI  Filed Vitals:   11/18/14 1507  BP: 135/87  Pulse: 93  Temp: 98.4 F (36.9 C)  Resp: 16    Physical Exam  Physical Exam  Constitutional: No distress.  Eyes: EOM are normal. Pupils are equal, round, and reactive to light.  Cardiovascular: Normal rate and regular rhythm.   Pulmonary/Chest: Breath sounds normal. No respiratory distress. She has no wheezes. She has no rales.  Abdominal: Soft. There is no tenderness. There is no rebound.  Musculoskeletal: She exhibits no edema.    CBC    Component Value Date/Time   WBC 13.5* 11/11/2014 0312   RBC 4.47 11/11/2014 0312   RBC 4.00 09/07/2014 2324   HGB 8.5* 11/11/2014 0312   HCT 31.3* 11/11/2014 0312   PLT 336 11/11/2014 0312   MCV 70.0* 11/11/2014 0312   LYMPHSABS 2.1 11/08/2014 0425   MONOABS 1.1* 11/08/2014 0425   EOSABS 0.0 11/08/2014 0425   BASOSABS 0.0 11/08/2014 0425    CMP     Component Value Date/Time   NA 133* 11/11/2014 0312   K 3.8 11/11/2014 0312   CL 99* 11/11/2014 0312   CO2 24 11/11/2014 0312   GLUCOSE 132* 11/11/2014 0312   BUN <5* 11/11/2014 0312   CREATININE 0.56 11/11/2014 0312   CALCIUM 8.4* 11/11/2014 0312   PROT 6.1* 11/09/2014 0310   ALBUMIN 3.1* 11/09/2014 0310   AST 57* 11/09/2014 0310   ALT 11* 11/09/2014 0310   ALKPHOS 75 11/09/2014 0310   BILITOT 1.0 11/09/2014 0310   GFRNONAA >60 11/11/2014 0312   GFRAA >60 11/11/2014 0312    Lab Results  Component Value Date/Time   CHOL 159 09/08/2014 01:47 AM    Lab Results  Component Value Date/Time   HGBA1C 5.7* 09/08/2014 01:47 AM    Lab Results  Component Value Date/Time   AST 57* 11/09/2014 03:10 AM    Assessment and Plan  Acute pulmonary embolism/DVT (deep venous thrombosis),  right  Patient is currently on Eliquis   Iron deficiency anemia due to chronic blood loss - Plan:Continue with iron supplement  ferrous sulfate 325 (65 FE) MG, will check her CBC on the following visit.  UTI (lower urinary tract infection) Symptomatically improved was treated with Cipro.  Uterine leiomyoma, unspecified location Patient to follow with her OB/GYN   Return in about 3 months (around 02/18/2015), or if symptoms worsen or fail to improve.   This note has been created with Surveyor, quantity. Any transcriptional errors are unintentional.    Lorayne Marek, MD

## 2014-11-24 ENCOUNTER — Other Ambulatory Visit: Payer: Self-pay | Admitting: *Deleted

## 2014-11-24 DIAGNOSIS — R519 Headache, unspecified: Secondary | ICD-10-CM

## 2014-11-24 DIAGNOSIS — R51 Headache: Principal | ICD-10-CM

## 2014-11-24 MED ORDER — SUMATRIPTAN SUCCINATE 50 MG PO TABS: 50.0000 mg | ORAL_TABLET | ORAL | Status: DC | PRN

## 2014-12-01 ENCOUNTER — Ambulatory Visit
Admission: RE | Admit: 2014-12-01 | Discharge: 2014-12-01 | Disposition: A | Discharge status: Home / Self Care | Payer: Medicaid Other | Source: Ambulatory Visit | Attending: Obstetrics | Admitting: Obstetrics

## 2014-12-01 DIAGNOSIS — D259 Leiomyoma of uterus, unspecified: Secondary | ICD-10-CM

## 2014-12-01 NOTE — Consult Note (Signed)
Chief Complaint: Chief Complaint  Patient presents with  . Advice Only    Consult for [place]      Referring Physician(s): Marshall,Bernard A  History of Present Illness: Melinda [name] is a 49 y.o. female with a long history of menorrhagia. Patient has had heavy menstrual bleeding since she was an adolescent. The menstrual bleeding has increased in the past 6 months and she required a blood transfusion in March 2016. Patient had an endometrial biopsy on 11/01/2014 which was negative for hyperplasia or malignancy. Benign proliferative endometrium identified. Patient presented to the emergency department on 11/08/2014 with acute chest pain. She was found to have an acute pulmonary embolism and right lower extremity DVT. Patient was hospitalized and received IV heparin and subsequently been switched to oral anticoagulation. The patient has a menstrual period every 30 days and lasts approximately 10 days. 2 days with large blood clots and heavy bleeding can require a new pad every hour. She is basically homebound during these days of heavy bleeding. She has some history of interperiod bleeding. The patient is currently having a menstrual period and this is first one since she's been on anticoagulation. She says that the period is heavier than usual and she is having a little bit more pain. She typically will have pelvic pain and pressure during her periods. She always has urinary urgency. She denies constipation. She is never taken oral contraception or never had any fibroid surgery. She has a history of gonorrhea infection greater than 10 years ago. Pap smear 11/01/2014 was negative. Previous ultrasound demonstrated 2 large fibroids. Pregnancy history is G1, P1.  Recently treated for urinary tract infection.  Past Medical History  Diagnosis Date  . Migraine   . PTSD (post-traumatic stress disorder)   . Asthma   . GERD (gastroesophageal reflux disease)   . Scoliosis   . Arthritis   . ADHD (attention  deficit hyperactivity disorder)   . Peripheral neuropathy   . Iron deficiency anemia due to chronic blood loss 09/09/2014  . Hyperglycemia 09/09/2014  . Low TSH level 09/08/2014  . Menorrhagia 09/08/2014  . Ovarian mass, right 09/09/2014    Past Surgical History  Procedure Laterality Date  . Cholecystectomy      Allergies: Penicillins; Rocephin; Shellfish allergy; and Citrus  Medications: Prior to Admission medications   Medication Sig Start Date End Date Taking? Authorizing Provider  acetaminophen (TYLENOL) 500 MG tablet Take 500 mg by mouth every 6 (six) hours as needed (patient takes 1-2 depending on pain level).   Yes Historical Provider, MD  apixaban (ELIQUIS) 5 MG TABS tablet Take 1 tablet (5 mg total) by mouth 2 (two) times daily. Take 10 mg (2 tablets) PO BID for 7 days, then 5 mg (1 tablet) PO BID 11/11/14  Yes Verlee Monte, MD  ciprofloxacin (CIPRO) 500 MG tablet Take 1 tablet (500 mg total) by mouth 2 (two) times daily. 11/11/14  Yes Verlee Monte, MD  diphenhydrAMINE (BENADRYL) 25 MG tablet Take 50 mg by mouth at bedtime as needed for sleep (sleep).   Yes Historical Provider, MD  ferrous sulfate 325 (65 FE) MG tablet Take 1 tablet (325 mg total) by mouth 2 (two) times daily with a meal. 11/18/14  Yes Deepak Advani, MD  gabapentin (NEURONTIN) 100 MG capsule Take 1 capsule (100 mg total) by mouth 2 (two) times daily. 11/18/14  Yes Lorayne Marek, MD  Multiple Vitamins-Minerals (MULTIVITAMIN & MINERAL PO) Take 1 tablet by mouth daily.   Yes Historical Provider, MD  SUMAtriptan (IMITREX) 50 MG tablet Take 1 tablet (50 mg total) by mouth every 2 (two) hours as needed for migraine or headache. May repeat in 2 hours if headache persists or recurs. 11/24/14  Yes Lorayne Marek, MD  butalbital-acetaminophen-caffeine (FIORICET/CODEINE) 50-325-40-30 MG per capsule Take 1 capsule by mouth every 4 (four) hours as needed for headache. Patient not taking: Reported on 12/01/2014 10/14/14   Arnoldo Morale, MD    HYDROcodone-acetaminophen (NORCO/VICODIN) 5-325 MG per tablet Take 1-2 tablets by mouth every 4 (four) hours as needed for moderate pain. Patient not taking: Reported on 10/14/2014 09/09/14   Venetia Maxon Rama, MD     Family History  Problem Relation Age of Onset  . Diabetes Mother   . Breast cancer Mother   . CAD Mother   . Hypertension Mother   . Alcohol abuse Father   . Hypertension Father     History   Social History  . Marital Status: Single    Spouse Name: N/A  . Number of Children: N/A  . Years of Education: N/A   Social History Main Topics  . Smoking status: Former Smoker -- 0.50 packs/day for 40 years    Types: Cigarettes    Quit date: 11/03/2014  . Smokeless tobacco: Never Used  . Alcohol Use: No  . Drug Use: Yes    Special: Methamphetamines  . Sexual Activity: Not on file   Other Topics Concern  . Not on file   Social History Narrative     Review of Systems  Respiratory: Negative.   Cardiovascular: Positive for chest pain.  Gastrointestinal: Negative for constipation.  Genitourinary: Positive for urgency.  Musculoskeletal: Positive for back pain.    Vital Signs: BP 127/81 mmHg  Pulse 121  Temp(Src) 98.8 F (37.1 C) (Oral)  Resp 15  Ht 5' 9.75" (1.772 m)  Wt 277 lb (125.646 kg)  BMI 40.01 kg/m2  SpO2 98%  LMP 11/27/2014 (Exact Date)  Physical Exam  Constitutional: She is oriented to person, place, and time.  Obese.  Cardiovascular: Regular rhythm, normal heart sounds and intact distal pulses.   Pulmonary/Chest: Effort normal and breath sounds normal.  Abdominal: Soft. Bowel sounds are normal.  Neurological: She is alert and oriented to person, place, and time.     Imaging: Dg Chest 2 View  11/08/2014   CLINICAL DATA:  Mid chest pain and shortness of breath beginning tonight  EXAM: CHEST  2 VIEW  COMPARISON:  Chest radiograph September 08, 2014  FINDINGS: The cardiac silhouette appears upper limits of normal in size, mediastinal silhouette is  nonsuspicious. RIGHT middle lobe scarring, no pleural effusion or focal consolidation. No pneumothorax. Soft tissue planes and included osseous structures are nonsuspicious. Mild degenerative change of thoracic spine.  IMPRESSION: No acute cardiopulmonary process ; stable chest radiograph from September 08, 2014.   Electronically Signed   By: Elon Alas   On: 11/08/2014 04:47   Ct Angio Chest Pe W/cm &/or Wo Cm  11/08/2014   CLINICAL DATA:  Acute chest pain.  EXAM: CT ANGIOGRAPHY CHEST WITH CONTRAST  TECHNIQUE: Multidetector CT imaging of the chest was performed using the standard protocol during bolus administration of intravenous contrast. Multiplanar CT image reconstructions and MIPs were obtained to evaluate the vascular anatomy.  CONTRAST:  40mL OMNIPAQUE IOHEXOL 350 MG/ML SOLN  COMPARISON:  Chest radiograph of same day.  FINDINGS: No pneumothorax or pleural effusion is noted. Probable scarring or subsegmental atelectasis is noted posteriorly in the right lung base. There is no  evidence of thoracic aortic dissection or aneurysm. Low densities are noted in both thyroid lobes. Large bilateral pulmonary emboli are noted, most prominently seen in the lower lobe branches of the right pulmonary artery. The RV/ LV ratio is calculated at 1.4 suggesting right heart strain. No mediastinal mass or adenopathy is noted.  Within the visualized portion of the abdomen, 5.2 cm low density mass with irregular calcifications and possible peripheral solid components is noted most consistent with adrenal mass. No significant osseous abnormality is noted.  Review of the MIP images confirms the above findings.  IMPRESSION: Large bilateral acute pulmonary emboli are noted with CT evidence of right heart strain (RV/LV Ratio = 1.4) consistent with at least submassive (intermediate risk) PE. The presence of right heart strain has been associated with an increased risk of morbidity and mortality. Please activate Code PE by paging  [phone].  Bilateral low densities are noted in the thyroid lobes ; thyroid ultrasound is recommended for further evaluation.  5.2 cm probable right adrenal mass is noted with irregular calcifications and possible peripheral solid components. MRI is recommended for further evaluation on nonemergent basis.  Critical Value/emergent results were called by telephone at the time of interpretation on 11/08/2014 at 9:23 am to Dr. Roderic Palau, who verbally acknowledged these results.   Electronically Signed   By: Marijo Conception, M.D.   On: 11/08/2014 09:24   Pelvic ultrasound 09/08/2014: 2 large fibroids. The endometrium is distorted by the fibroids and unclear whether one of the fibroid is intramural or submucosal. 2.5 cm thick wall lesion in the right ovary. This may represent a corpus luteum and but a follow-up ultrasound is recommended in 6 weeks to assess for resolution. Nonvisualization of left ovary.  Labs:  CBC:  Recent Labs  11/08/14 1645 11/09/14 0310 11/10/14 0350 11/11/14 0312  WBC 15.2* 16.2* 17.3* 13.5*  HGB 9.2* 9.0* 8.7* 8.5*  HCT 33.5* 32.8* 32.0* 31.3*  PLT 260 266 316 336    COAGS: No results for input(s): INR, APTT in the last 8760 hours.  BMP:  Recent Labs  09/09/14 0510 11/08/14 0425 11/09/14 0310 11/11/14 0312  NA 139 135 135 133*  K 3.8 4.0 4.7 3.8  CL 108 104 104 99*  CO2 24 23 22 24   GLUCOSE 106* 151* 132* 132*  BUN 11 5* 7 <5*  CALCIUM 8.5 9.1 8.5* 8.4*  CREATININE 0.68 0.79 0.66 0.56  GFRNONAA >90 >60 >60 >60  GFRAA >90 >60 >60 >60    LIVER FUNCTION TESTS:  Recent Labs  09/07/14 2025 09/08/14 0147 11/09/14 0310  BILITOT 0.4 0.5 1.0  AST 12 14 57*  ALT 10 11 11*  ALKPHOS 73 73 75  PROT 6.9 7.2 6.1*  ALBUMIN 3.7 4.0 3.1*     Assessment and Plan:   Uterine fibroids and menorrhagia: Patient has a long history of menorrhagia but the symptoms have been increasing in the past 6 months and the patient now has a diagnosis of anemia which has  required a blood transfusion. Ultrasound has demonstrated at least 2 large uterine fibroids which could be contributing to the menorrhagia. I discussed treatment options for fibroids which include hysterectomy and uterine artery embolization. We discussed the uterine artery embolization procedure in depth.  Explained the procedure and the post procedure care which includes at least one night in the hospital. We also discussed post embolization syndrome which can last at 1-2 weeks following the procedure. The patient is interested in the uterine artery embolization procedure. We  will plan for a pelvic MRI to further evaluated the uterine fibroids. If the patient is a candidate based on MRI, we will plan for uterine artery embolization procedure in the near future.  History of pulmonary embolism and DVT: Patient is currently on Eliquis for the thromboembolic disease. If we proceed with the uterine artery embolization, the patient will likely need to go off the Eliquis for a short time period and we discussed the need for a retrievable IVC filter. The IVC filter could be placed prior to going off of the Eliquis.  Anemia: Patient has chronic blood loss anemia related to her menorrhagia. On today's visit, the patient was tachycardic and she is currently having a heavy menstrual bleeding. I suspect that tachycardia could be related to anemia and menstrual bleeding. I'm concerned about the patient's anemia and heavy menstrual bleeding while she is on Eliquis.  I suggested that the patient contact her GYN, primary care physician or [place] if the menstrual bleeding continues to be heavier than usual in the next few days.  Thank you for this interesting consult.  I greatly enjoyed meeting Melinda [name] and look forward to participating in their care.  SignedCarylon Perches 12/01/2014, 2:18 PM   I spent a total of 30 Minutes   in face to face in clinical consultation, greater than 50% of which was  counseling/coordinating care for uterine fibroids.

## 2014-12-05 ENCOUNTER — Other Ambulatory Visit: Payer: Self-pay | Admitting: Obstetrics

## 2014-12-05 DIAGNOSIS — D259 Leiomyoma of uterus, unspecified: Secondary | ICD-10-CM

## 2014-12-07 ENCOUNTER — Other Ambulatory Visit: Payer: Self-pay | Admitting: Family Medicine

## 2014-12-08 ENCOUNTER — Telehealth: Payer: Self-pay | Admitting: Internal Medicine

## 2014-12-08 NOTE — Telephone Encounter (Signed)
Patient called requesting medication refill on apixaban (ELIQUIS) 5 MG TABS tablet AND butalbital-acetaminophen-caffeine (FIORICET/CODEINE) 50-325-40-30 MG per capsule. Pt would like medication sent to  [facility] Please f/u with patient

## 2014-12-12 ENCOUNTER — Telehealth: Payer: Self-pay

## 2014-12-12 ENCOUNTER — Other Ambulatory Visit: Payer: Self-pay

## 2014-12-12 MED ORDER — BUTALBITAL-APAP-CAFF-COD 50-325-40-30 MG PO CAPS: 1.0000 | ORAL_CAPSULE | ORAL | Status: DC | PRN

## 2014-12-12 MED ORDER — APIXABAN 5 MG PO TABS
5.0000 mg | ORAL_TABLET | Freq: Two times a day (BID) | ORAL | Status: DC
Start: 2014-12-12 — End: 2015-03-13

## 2014-12-12 NOTE — Telephone Encounter (Signed)
Patient called requesting a refill on her eliquis and fioricet Per Dr Annitta Needs it is ok to refill Prescriptions sent [facility]

## 2014-12-12 NOTE — Telephone Encounter (Signed)
Pt following up on refill request, see previous medication.  Please f/u with pt, Pt only has two days worth of medication.

## 2014-12-21 ENCOUNTER — Ambulatory Visit
Admission: RE | Admit: 2014-12-21 | Discharge: 2014-12-21 | Disposition: A | Discharge status: Home / Self Care | Payer: Medicaid Other | Source: Ambulatory Visit | Attending: Obstetrics | Admitting: Obstetrics

## 2014-12-21 ENCOUNTER — Other Ambulatory Visit: Payer: Self-pay | Admitting: Obstetrics

## 2014-12-21 DIAGNOSIS — D259 Leiomyoma of uterus, unspecified: Secondary | ICD-10-CM

## 2014-12-21 MED ORDER — GADOBENATE DIMEGLUMINE 529 MG/ML IV SOLN
20.0000 mL | Freq: Once | INTRAVENOUS | Status: AC | PRN
  Administered 2014-12-21: 20 mL via INTRAVENOUS

## 2015-01-05 ENCOUNTER — Ambulatory Visit
Admission: RE | Admit: 2015-01-05 | Discharge: 2015-01-05 | Disposition: A | Discharge status: Home / Self Care | Payer: Medicaid Other | Source: Ambulatory Visit | Attending: Obstetrics | Admitting: Obstetrics

## 2015-01-05 ENCOUNTER — Other Ambulatory Visit (HOSPITAL_COMMUNITY): Payer: Self-pay | Admitting: Diagnostic Radiology

## 2015-01-05 DIAGNOSIS — D259 Leiomyoma of uterus, unspecified: Secondary | ICD-10-CM

## 2015-01-05 NOTE — Consult Note (Signed)
Chief Complaint: Chief Complaint  Patient presents with  . Follow-up    to review MR (pre [place] screening)      History of Present Illness: Melinda [name] is a 49 y.o. female with uterine fibroids and menorrhagia. I saw the patient for uterine artery embolization consultation and we have obtained a pelvic MRI. The patient presents for review of the pelvic MRI and further discussion of treatment options. 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 No significant changes in her medical history since our last visit.  Past Medical History  Diagnosis Date  . Migraine   . PTSD (post-traumatic stress disorder)   . Asthma   . GERD (gastroesophageal reflux disease)   . Scoliosis   . Arthritis   . ADHD (attention deficit hyperactivity disorder)   . Peripheral neuropathy   . Iron deficiency anemia due to chronic blood loss 09/09/2014  . Hyperglycemia 09/09/2014  . Low TSH level 09/08/2014  . Menorrhagia 09/08/2014  . Ovarian mass, right 09/09/2014    Past Surgical History  Procedure Laterality Date  . Cholecystectomy      Allergies: Penicillins; Rocephin; Shellfish allergy; and Citrus  Medications: Prior to Admission medications   Medication Sig Start Date End Date Taking? Authorizing Provider  acetaminophen (TYLENOL) 500 MG tablet Take 500 mg by mouth every 6 (six) hours as needed (patient takes 1-2 depending on pain level).   Yes Historical Provider, MD  apixaban (ELIQUIS) 5 MG TABS tablet Take 1 tablet (5 mg total) by mouth 2 (two) times daily. 12/12/14  Yes Lorayne Marek, MD  butalbital-acetaminophen-caffeine (FIORICET/CODEINE) 50-325-40-30 MG per capsule Take 1 capsule by mouth every 4 (four) hours as needed for headache. 12/12/14  Yes Lorayne Marek, MD  diphenhydrAMINE (BENADRYL) 25 MG tablet Take 50 mg by mouth at bedtime as needed for sleep (sleep).   Yes Historical Provider, MD  ferrous  sulfate 325 (65 FE) MG tablet Take 1 tablet (325 mg total) by mouth 2 (two) times daily with a meal. 11/18/14  Yes Deepak Advani, MD  gabapentin (NEURONTIN) 100 MG capsule Take 1 capsule (100 mg total) by mouth 2 (two) times daily. 11/18/14  Yes Lorayne Marek, MD  Multiple Vitamins-Minerals (MULTIVITAMIN & MINERAL PO) Take 1 tablet by mouth daily.   Yes Historical Provider, MD  SUMAtriptan (IMITREX) 50 MG tablet Take 1 tablet (50 mg total) by mouth every 2 (two) hours as needed for migraine or headache. May repeat in 2 hours if headache persists or recurs. 11/24/14  Yes Lorayne Marek, MD  ciprofloxacin (CIPRO) 500 MG tablet Take 1 tablet (500 mg total) by mouth 2 (two) times daily. Patient not taking: Reported on 01/05/2015 11/11/14   Verlee Monte, MD  HYDROcodone-acetaminophen (NORCO/VICODIN) 5-325 MG per tablet Take 1-2 tablets by mouth every 4 (four) hours as needed for moderate pain. Patient not taking: Reported on 10/14/2014 09/09/14   Venetia Maxon Rama, MD     Family History  Problem Relation Age of Onset  . Diabetes Mother   . Breast cancer Mother   . CAD Mother   . Hypertension Mother   . Alcohol abuse Father   . Hypertension Father     History   Social History  . Marital Status: Single    Spouse Name: N/A  . Number of Children: N/A  . Years of Education: N/A   Social History Main Topics  . Smoking status: Former Smoker -- 0.50  packs/day for 40 years    Types: Cigarettes    Quit date: 11/03/2014  . Smokeless tobacco: Never Used  . Alcohol Use: No  . Drug Use: Yes    Special: Methamphetamines  . Sexual Activity: Not on file   Other Topics Concern  . Not on file   Social History Narrative    Review of Systems  Vital Signs: There were no vitals taken for this visit.  Physical Exam   No examination performed.     Imaging: Mr Pelvis W Wo Contrast  12/21/2014   CLINICAL DATA:  Pre [place], evaluate uterine fibroids  EXAM: MRI PELVIS WITHOUT AND WITH CONTRAST  TECHNIQUE:  Multiplanar multisequence MR imaging of the pelvis was performed both before and after administration of intravenous contrast.  CONTRAST:  91mL MULTIHANCE GADOBENATE DIMEGLUMINE 529 MG/ML IV SOLN  COMPARISON:  Pelvic ultrasound dated 09/08/2014  FINDINGS: Uterus is enlarged, measuring 12.1 x 10.5 x 13.5 cm.  Multiple uterine fibroids, including:  --8.7 x 8.0 x 7.8 cm intramural fibroid with dominant mucosal component in the left uterine fundus (series 4/ image 16)  --5.8 x 6.7 x 6.4 cm intramural fibroid with small submucosal component in the right lower uterine body (series 4/image 8)  --Additional submucosal fibroids measuring up to 2.9 cm, proximally 5-6 in number (series 4/images 12-14)  All fibroids uniformly enhance following contrast administration.  Endometrial complex is distorted/ displaced by multiple uterine fibroids.  Right ovary measures 1.4 x 2.5 x 2.5 cm (series 6/image 15) and is within normal limits.  Left ovary measures 2.9 x 2.3 x 2.3 cm (series 6/image 8) and is notable for a dominant corpus luteal cyst.  No pelvic ascites.  No suspicious pelvic lymphadenopathy.  Degenerative changes with posterior disc extrusion at L5-S1 (series 4/image 12).  IMPRESSION: Multiple uterine fibroids, including a dominant 8.7 cm intramural fibroid with mucosal component in the left uterine fundus, as above. All fibroids enhance following contrast administration.   Electronically Signed   By: Julian Hy M.D.   On: 12/21/2014 13:15    Labs:  CBC:  Recent Labs  11/08/14 1645 11/09/14 0310 11/10/14 0350 11/11/14 0312  WBC 15.2* 16.2* 17.3* 13.5*  HGB 9.2* 9.0* 8.7* 8.5*  HCT 33.5* 32.8* 32.0* 31.3*  PLT 260 266 316 336    COAGS: No results for input(s): INR, APTT in the last 8760 hours.  BMP:  Recent Labs  09/09/14 0510 11/08/14 0425 11/09/14 0310 11/11/14 0312  NA 139 135 135 133*  K 3.8 4.0 4.7 3.8  CL 108 104 104 99*  CO2 24 23 22 24   GLUCOSE 106* 151* 132* 132*  BUN 11 5* 7  <5*  CALCIUM 8.5 9.1 8.5* 8.4*  CREATININE 0.68 0.79 0.66 0.56  GFRNONAA >90 >60 >60 >60  GFRAA >90 >60 >60 >60    LIVER FUNCTION TESTS:  Recent Labs  09/07/14 2025 09/08/14 0147 11/09/14 0310  BILITOT 0.4 0.5 1.0  AST 12 14 57*  ALT 10 11 11*  ALKPHOS 73 73 75  PROT 6.9 7.2 6.1*  ALBUMIN 3.7 4.0 3.1*     Assessment and Plan:  49 year old with uterine fibroids and menorrhagia. I reviewed the pelvic MRI images with the patient. The patient has a dominant fibroid with a large submucosal component. I explained to the patient that this fibroid is likely contributing to her symptoms but could cause problems after a uterine artery embolization. Based on the large submucosal component of this dominant fibroid, the patient is at  high risk to develop vaginal discharge from necrosis of this dominant fibroid. I explained that the vaginal discharge could last for months and she may be at increased risk for infection and possibly fibroid dislodgment. After review of the MRI and discussion with the patient, the patient would prefer hysterectomy as treatment of her menorrhagia and fibroid disease. Patient has asked for a referral to a female gynecologist to discuss hysterectomy.  Patient will follow-up as needed.  SignedCarylon Perches 01/05/2015, 11:18 AM   I spent a total of   15 Minutes in face to face in clinical consultation, greater than 50% of which was counseling/coordinating care for fibroid disease.

## 2015-01-06 ENCOUNTER — Other Ambulatory Visit: Payer: Self-pay | Admitting: Internal Medicine

## 2015-01-09 ENCOUNTER — Other Ambulatory Visit: Payer: Self-pay | Admitting: Internal Medicine

## 2015-01-16 ENCOUNTER — Emergency Department (HOSPITAL_COMMUNITY): Payer: Medicaid Other

## 2015-01-16 ENCOUNTER — Emergency Department (HOSPITAL_COMMUNITY)
Admission: EM | Admit: 2015-01-16 | Discharge: 2015-01-16 | Disposition: A | Discharge status: Home / Self Care | Payer: Medicaid Other | Attending: Emergency Medicine | Admitting: Emergency Medicine

## 2015-01-16 ENCOUNTER — Encounter (HOSPITAL_COMMUNITY): Payer: Self-pay

## 2015-01-16 DIAGNOSIS — Z3202 Encounter for pregnancy test, result negative: Secondary | ICD-10-CM | POA: Insufficient documentation

## 2015-01-16 DIAGNOSIS — Z7902 Long term (current) use of antithrombotics/antiplatelets: Secondary | ICD-10-CM | POA: Diagnosis not present

## 2015-01-16 DIAGNOSIS — T148 Other injury of unspecified body region: Secondary | ICD-10-CM | POA: Diagnosis not present

## 2015-01-16 DIAGNOSIS — Z87891 Personal history of nicotine dependence: Secondary | ICD-10-CM | POA: Insufficient documentation

## 2015-01-16 DIAGNOSIS — J45909 Unspecified asthma, uncomplicated: Secondary | ICD-10-CM | POA: Insufficient documentation

## 2015-01-16 DIAGNOSIS — Z862 Personal history of diseases of the blood and blood-forming organs and certain disorders involving the immune mechanism: Secondary | ICD-10-CM | POA: Insufficient documentation

## 2015-01-16 DIAGNOSIS — S59912A Unspecified injury of left forearm, initial encounter: Secondary | ICD-10-CM | POA: Insufficient documentation

## 2015-01-16 DIAGNOSIS — Y92002 Bathroom of unspecified non-institutional (private) residence single-family (private) house as the place of occurrence of the external cause: Secondary | ICD-10-CM | POA: Diagnosis not present

## 2015-01-16 DIAGNOSIS — S069X9A Unspecified intracranial injury with loss of consciousness of unspecified duration, initial encounter: Secondary | ICD-10-CM

## 2015-01-16 DIAGNOSIS — M1990 Unspecified osteoarthritis, unspecified site: Secondary | ICD-10-CM | POA: Insufficient documentation

## 2015-01-16 DIAGNOSIS — G629 Polyneuropathy, unspecified: Secondary | ICD-10-CM | POA: Diagnosis not present

## 2015-01-16 DIAGNOSIS — S29001A Unspecified injury of muscle and tendon of front wall of thorax, initial encounter: Secondary | ICD-10-CM | POA: Insufficient documentation

## 2015-01-16 DIAGNOSIS — Z8719 Personal history of other diseases of the digestive system: Secondary | ICD-10-CM | POA: Diagnosis not present

## 2015-01-16 DIAGNOSIS — G8929 Other chronic pain: Secondary | ICD-10-CM | POA: Diagnosis not present

## 2015-01-16 DIAGNOSIS — Y9389 Activity, other specified: Secondary | ICD-10-CM | POA: Diagnosis not present

## 2015-01-16 DIAGNOSIS — Z8742 Personal history of other diseases of the female genital tract: Secondary | ICD-10-CM | POA: Insufficient documentation

## 2015-01-16 DIAGNOSIS — W1839XA Other fall on same level, initial encounter: Secondary | ICD-10-CM | POA: Insufficient documentation

## 2015-01-16 DIAGNOSIS — S3992XA Unspecified injury of lower back, initial encounter: Secondary | ICD-10-CM | POA: Diagnosis not present

## 2015-01-16 DIAGNOSIS — S0990XA Unspecified injury of head, initial encounter: Secondary | ICD-10-CM | POA: Diagnosis present

## 2015-01-16 DIAGNOSIS — Y998 Other external cause status: Secondary | ICD-10-CM | POA: Diagnosis not present

## 2015-01-16 DIAGNOSIS — S060X9A Concussion with loss of consciousness of unspecified duration, initial encounter: Secondary | ICD-10-CM | POA: Diagnosis not present

## 2015-01-16 DIAGNOSIS — Z79899 Other long term (current) drug therapy: Secondary | ICD-10-CM | POA: Diagnosis not present

## 2015-01-16 DIAGNOSIS — G43909 Migraine, unspecified, not intractable, without status migrainosus: Secondary | ICD-10-CM | POA: Insufficient documentation

## 2015-01-16 DIAGNOSIS — Z880 Allergy status to penicillin: Secondary | ICD-10-CM | POA: Diagnosis not present

## 2015-01-16 DIAGNOSIS — Z86711 Personal history of pulmonary embolism: Secondary | ICD-10-CM | POA: Diagnosis not present

## 2015-01-16 DIAGNOSIS — S8992XA Unspecified injury of left lower leg, initial encounter: Secondary | ICD-10-CM | POA: Diagnosis not present

## 2015-01-16 DIAGNOSIS — T148XXA Other injury of unspecified body region, initial encounter: Secondary | ICD-10-CM

## 2015-01-16 HISTORY — DX: Other pulmonary embolism without acute cor pulmonale: I26.99

## 2015-01-16 LAB — POC URINE PREG, ED
PREG TEST UR: NEGATIVE
Preg Test, Ur: NEGATIVE

## 2015-01-16 NOTE — Discharge Instructions (Signed)
Please seek immediate medical attention if you have severe headache, confusion, numbness/weakness of extremities, or any other new symptoms.  Blunt Trauma You have been evaluated for injuries. You have been examined and your caregiver has not found injuries serious enough to require hospitalization. It is common to have multiple bruises and sore muscles following an accident. These tend to feel worse for the first 24 hours. You will feel more stiffness and soreness over the next several hours and worse when you wake up the first morning after your accident. After this point, you should begin to improve with each passing day. The amount of improvement depends on the amount of damage done in the accident. Following your accident, if some part of your body does not work as it should, or if the pain in any area continues to increase, you should return to the Emergency Department for re-evaluation.  HOME CARE INSTRUCTIONS  Routine care for sore areas should include:  Ice to sore areas every 2 hours for 20 minutes while awake for the next 2 days.  Drink extra fluids (not alcohol).  Take a hot or warm shower or bath once or twice a day to increase blood flow to sore muscles. This will help you "limber up".  Activity as tolerated. Lifting may aggravate neck or back pain.  Only take over-the-counter or prescription medicines for pain, discomfort, or fever as directed by your caregiver. Do not use aspirin. This may increase bruising or increase bleeding if there are small areas where this is happening. SEEK IMMEDIATE MEDICAL CARE IF:  Numbness, tingling, weakness, or problem with the use of your arms or legs.  A severe headache is not relieved with medications.  There is a change in bowel or bladder control.  Increasing pain in any areas of the body.  Short of breath or dizzy.  Nauseated, vomiting, or sweating.  Increasing belly (abdominal) discomfort.  Blood in urine, stool, or vomiting  blood.  Pain in either shoulder in an area where a shoulder strap would be.  Feelings of lightheadedness or if you have a fainting episode. Sometimes it is not possible to identify all injuries immediately after the trauma. It is important that you continue to monitor your condition after the emergency department visit. If you feel you are not improving, or improving more slowly than should be expected, call your physician. If you feel your symptoms (problems) are worsening, return to the Emergency Department immediately. Document Released: 03/13/2001 Document Revised: 09/09/2011 Document Reviewed: 02/03/2008 [facility] Patient Information 2015 [place], [place]. This information is not intended to replace advice given to you by your health care provider. Make sure you discuss any questions you have with your health care provider.

## 2015-01-16 NOTE — ED Provider Notes (Signed)
CSN: [id number]     Arrival date & time 01/16/15  1220 History   First MD Initiated Contact with Patient 01/16/15 1225     Chief Complaint  Patient presents with  . Fall  . Arm Pain     (Consider location/radiation/quality/duration/timing/severity/associated sxs/prior Treatment) HPI Comments: Patient is a 49 year old female with past medical history including chronic back pain, peripheral neuropathy, PE on Eliquis, GERD, PTSD who presents with fall. The patient states that this morning after she got up, just before 7 AM, she was in her bathroom when she fell down. She does not recall all details of the event. She remembers trying to grab something on the way down. She does not recall hitting her head but she states that the right side of her forehead is sensitive and sore. She thinks that she fell on her left side because since the fall she has had left arm, left-sided rib, and left leg pain as well as back pain. The pain is constant and moderate in intensity. The pain is located in her left lower back, where she has had some pain in the past before. No focal numbness/weakness. No tingling. No upper extremity weakness. She denies any midline neck pain but states that while she was in the ambulance on the way here she started having some left-sided neck pain. No headache or blurry vision.  Patient is a 49 y.o. female presenting with fall and arm pain. The history is provided by the patient.  Fall  Arm Pain    Past Medical History  Diagnosis Date  . Migraine   . PTSD (post-traumatic stress disorder)   . Asthma   . GERD (gastroesophageal reflux disease)   . Scoliosis   . Arthritis   . ADHD (attention deficit hyperactivity disorder)   . Peripheral neuropathy   . Iron deficiency anemia due to chronic blood loss 09/09/2014  . Hyperglycemia 09/09/2014  . Low TSH level 09/08/2014  . Menorrhagia 09/08/2014  . Ovarian mass, right 09/09/2014  . PE (pulmonary embolism)    Past Surgical History   Procedure Laterality Date  . Cholecystectomy     Family History  Problem Relation Age of Onset  . Diabetes Mother   . Breast cancer Mother   . CAD Mother   . Hypertension Mother   . Alcohol abuse Father   . Hypertension Father    History  Substance Use Topics  . Smoking status: Former Smoker -- 0.50 packs/day for 40 years    Types: Cigarettes    Quit date: 11/03/2014  . Smokeless tobacco: Never Used  . Alcohol Use: No   OB History    No data available     Review of Systems  All other systems reviewed and are negative.     Allergies  Penicillins; Rocephin; Shellfish allergy; and Citrus  Home Medications   Prior to Admission medications   Medication Sig Start Date End Date Taking? Authorizing Provider  acetaminophen (TYLENOL) 500 MG tablet Take 500 mg by mouth every 6 (six) hours as needed (patient takes 1-2 depending on pain level).    Historical Provider, MD  apixaban (ELIQUIS) 5 MG TABS tablet Take 1 tablet (5 mg total) by mouth 2 (two) times daily. 12/12/14   Lorayne Marek, MD  butalbital-acetaminophen-caffeine (FIORICET WITH CODEINE) 50-325-40-30 MG per capsule TAKE ONE CAPSULE BY MOUTH EVERY FOUR HOURS AS NEEDED FOR HEADACHE 01/11/15   Lorayne Marek, MD  ciprofloxacin (CIPRO) 500 MG tablet Take 1 tablet (500 mg total) by mouth 2 (  two) times daily. Patient not taking: Reported on 01/05/2015 11/11/14   Verlee Monte, MD  diphenhydrAMINE (BENADRYL) 25 MG tablet Take 50 mg by mouth at bedtime as needed for sleep (sleep).    Historical Provider, MD  ferrous sulfate 325 (65 FE) MG tablet Take 1 tablet (325 mg total) by mouth 2 (two) times daily with a meal. 11/18/14   Lorayne Marek, MD  gabapentin (NEURONTIN) 100 MG capsule TAKE ONE (1) CAPSULE BY MOUTH 2 TIMES DAILY 01/11/15   Lorayne Marek, MD  HYDROcodone-acetaminophen (NORCO/VICODIN) 5-325 MG per tablet Take 1-2 tablets by mouth every 4 (four) hours as needed for moderate pain. Patient not taking: Reported on 10/14/2014  09/09/14   Venetia Maxon Rama, MD  Multiple Vitamins-Minerals (MULTIVITAMIN & MINERAL PO) Take 1 tablet by mouth daily.    Historical Provider, MD  SUMAtriptan (IMITREX) 50 MG tablet Take 1 tablet (50 mg total) by mouth every 2 (two) hours as needed for migraine or headache. May repeat in 2 hours if headache persists or recurs. 11/24/14   Lorayne Marek, MD   BP 137/79 mmHg  Pulse 82  Temp(Src) 98.6 F (37 C) (Oral)  Resp 20  SpO2 97%  LMP 01/02/2015 (Approximate)   Physical Exam  Constitutional: She is oriented to person, place, and time. She appears well-developed and well-nourished.  HENT:  Head: Normocephalic and atraumatic.  No swelling or ecchymoses on face  Eyes: EOM are normal. Pupils are equal, round, and reactive to light.  Neck: Normal range of motion. Neck supple.  Mild left neck muscular tenderness, no midline pain  Cardiovascular: Normal rate, regular rhythm and normal heart sounds.   Pulmonary/Chest: Effort normal and breath sounds normal.  Abdominal: Soft. Bowel sounds are normal.  Musculoskeletal: Normal range of motion.  Mild tenderness to palpation of L patella along joint line, normal ROM L knee and hip; normal distal sensation, feet warm and well perfused; no swelling or deformity L arm/shoulder, normal ROM L shoulder and elbow  Neurological: She is alert and oriented to person, place, and time. She has normal reflexes. No cranial nerve deficit. She exhibits normal muscle tone.  Skin: Skin is warm and dry.  No ecchymoses  Psychiatric: She has a normal mood and affect.  Nursing note and vitals reviewed.   ED Course  Procedures (including critical care time) Labs Review Labs Reviewed  POC URINE PREG, ED  POC URINE PREG, ED    Imaging Review Dg Chest 2 View  01/16/2015   CLINICAL DATA:  Pt states she had a fall in her bathroom this morning and does not remember how she fell. States she fell on left side. Denies LOC. C/o anterior left knee pain, lower back pain  all across. States hx of herniated coccyx. Hx asthma. Non-smoker.  EXAM: CHEST  2 VIEW  COMPARISON:  11/08/2014  FINDINGS: Cardiac silhouette is normal in size and configuration. Normal mediastinal and hilar contours. Mild stable linear atelectasis in the right lower lobe. Lungs otherwise clear. No pleural effusion or pneumothorax.  Bony thorax is intact.  IMPRESSION: No active cardiopulmonary disease.   Electronically Signed   By: Lajean Manes M.D.   On: 01/16/2015 14:13   Dg Lumbar Spine Complete  01/16/2015   CLINICAL DATA:  Pain following fall earlier today  EXAM: LUMBAR SPINE - COMPLETE 4+ VIEW  COMPARISON:  None.  FINDINGS: Frontal, lateral, spot lumbosacral lateral, and bilateral oblique views were obtained. There are 5 non-rib-bearing lumbar type vertebral bodies. There is no fracture or  spondylolisthesis. There is moderately severe disc space narrowing at L5-S1 with vacuum phenomenon at this level. There are anterior posterior osteophytes at L5 and S1. There is no significant facet arthropathy appreciable on this study.  IMPRESSION: Focal disc space narrowing at L5-S1 with vacuum phenomenon as well as anterior and posterior osteophytes at L5 and S1. No fracture or spondylolisthesis.   Electronically Signed   By: Lowella Grip III M.D.   On: 01/16/2015 14:14   Ct Head Wo Contrast  01/16/2015   CLINICAL DATA:  Pain following fall earlier today  EXAM: CT HEAD WITHOUT CONTRAST  CT CERVICAL SPINE WITHOUT CONTRAST  TECHNIQUE: Multidetector CT imaging of the head and cervical spine was performed following the standard protocol without intravenous contrast. Multiplanar CT image reconstructions of the cervical spine were also generated.  COMPARISON:  Head CT August 02, 2005  FINDINGS: CT HEAD FINDINGS  The ventricles are normal in size and configuration. There is no intracranial mass, hemorrhage, extra-axial fluid collection, or midline shift. The gray-white compartments are normal. No acute infarct  evident. Bony calvarium appears intact. The mastoid air cells are clear.  CT CERVICAL SPINE FINDINGS  There is no fracture or spondylolisthesis. Prevertebral soft tissues and predental space regions are normal. There is moderate disc space narrowing at C4-5, C5-6, and C6-7. There is milder narrowing at C3-4. There are anterior osteophytes at C4, C5, and C6. There is calcification in the anterior ligament at C5-6. There is no nerve root edema or effacement. No disc extrusion or stenosis. There is broad-based disc bulging at C5-6. Visualized lung apices are clear. Thyroid is diffusely enlarged with areas of decreased attenuation in each thyroid.  IMPRESSION: CT head:  Study within normal limits.  CT cervical spine: Areas of osteoarthritic change. No fracture or spondylolisthesis.  Enlarged thyroid with nodular lesions bilaterally. Recommend further evaluation with thyroid ultrasound. If patient is clinically hyperthyroid, consider nuclear medicine thyroid uptake and scan.   Electronically Signed   By: Lowella Grip III M.D.   On: 01/16/2015 13:33   Ct Cervical Spine Wo Contrast  01/16/2015   CLINICAL DATA:  Pain following fall earlier today  EXAM: CT HEAD WITHOUT CONTRAST  CT CERVICAL SPINE WITHOUT CONTRAST  TECHNIQUE: Multidetector CT imaging of the head and cervical spine was performed following the standard protocol without intravenous contrast. Multiplanar CT image reconstructions of the cervical spine were also generated.  COMPARISON:  Head CT August 02, 2005  FINDINGS: CT HEAD FINDINGS  The ventricles are normal in size and configuration. There is no intracranial mass, hemorrhage, extra-axial fluid collection, or midline shift. The gray-white compartments are normal. No acute infarct evident. Bony calvarium appears intact. The mastoid air cells are clear.  CT CERVICAL SPINE FINDINGS  There is no fracture or spondylolisthesis. Prevertebral soft tissues and predental space regions are normal. There is  moderate disc space narrowing at C4-5, C5-6, and C6-7. There is milder narrowing at C3-4. There are anterior osteophytes at C4, C5, and C6. There is calcification in the anterior ligament at C5-6. There is no nerve root edema or effacement. No disc extrusion or stenosis. There is broad-based disc bulging at C5-6. Visualized lung apices are clear. Thyroid is diffusely enlarged with areas of decreased attenuation in each thyroid.  IMPRESSION: CT head:  Study within normal limits.  CT cervical spine: Areas of osteoarthritic change. No fracture or spondylolisthesis.  Enlarged thyroid with nodular lesions bilaterally. Recommend further evaluation with thyroid ultrasound. If patient is clinically hyperthyroid, consider nuclear medicine  thyroid uptake and scan.   Electronically Signed   By: Lowella Grip III M.D.   On: 01/16/2015 13:33   Dg Knee Complete 4 Views Left  01/16/2015   CLINICAL DATA:  Status post fall, anterior left knee pain and lower back pain.  EXAM: LEFT KNEE - COMPLETE 4+ VIEW  COMPARISON:  None.  FINDINGS: Osseous alignment is normal. Bone mineralization is normal. There is no fracture line or displaced fracture fragment identified. No significant degenerative change identified, perhaps minimal degenerative spurring at the far medial aspects of the joint space. No evidence of joint effusion and adjacent soft tissues are unremarkable.  IMPRESSION: No acute findings. No fracture or dislocation. No evidence of joint effusion seen.   Electronically Signed   By: Franki Cabot M.D.   On: 01/16/2015 14:15     EKG Interpretation None      MDM   Final diagnoses:  Minor head injury with loss of consciousness, initial encounter  Contusion    49 year old female in [place] with a fall from standing that occurred at home just before 7 AM this morning. Patient arrived via EMS. Patient well-appearing with normal vital signs. No neurologic deficits on exam to suggest intracranial process or spinal  injury. Obtained CT of head and C-spine given loss of consciousness and Eliquis use. Also obtained films L knee, lumbar spine, chest. No other abnormalities noted of left arm.  All imaging negative. The patient is greater than 6 hours out from her fall and her neg head CT is reassuring against intracranial injury. On reexamination, the patient is well-appearing. Reviewed results and supportive care at home. Return precautions also reviewed. Patient instructed to follow up with PCP in 2-3 days. Patient was understanding of plan and discharged in satisfactory condition.   Sharlett Iles, MD 01/16/15 1452

## 2015-01-16 NOTE — ED Notes (Signed)
Pt discharged.  Waiting cab voucher

## 2015-01-16 NOTE — ED Notes (Addendum)
Per EMS, pt from home.  Pt fell down this morning.  Pt states she lost balance.  Denies chest pain, dizziness or other cardiac symptoms.   Does not remember time.  Pt denies LOC.  Pt fell into wall and then went to floor.  Pt landed on left side.  Denies remembering hitting her head.  Pt is on blood thinners for DVT/PE.  Pt c/o left sided pain - arm and leg.  Vitals: 132/76, hr 72, cbg 99.  Pt alert and oriented on arrival.  Pt able to walk to ambulance.

## 2015-01-23 ENCOUNTER — Other Ambulatory Visit: Payer: Self-pay

## 2015-01-23 DIAGNOSIS — R51 Headache: Principal | ICD-10-CM

## 2015-01-23 DIAGNOSIS — R519 Headache, unspecified: Secondary | ICD-10-CM

## 2015-01-23 MED ORDER — SUMATRIPTAN SUCCINATE 50 MG PO TABS: 50.0000 mg | ORAL_TABLET | ORAL | Status: DC | PRN

## 2015-01-24 ENCOUNTER — Ambulatory Visit: Payer: Medicaid Other | Attending: Internal Medicine | Admitting: Internal Medicine

## 2015-01-24 ENCOUNTER — Encounter: Payer: Self-pay | Admitting: Internal Medicine

## 2015-01-24 VITALS — BP 134/86 | HR 92 | Temp 98.0°F | Resp 16 | Wt 290.0 lb

## 2015-01-24 DIAGNOSIS — R7989 Other specified abnormal findings of blood chemistry: Secondary | ICD-10-CM | POA: Diagnosis not present

## 2015-01-24 DIAGNOSIS — Z79899 Other long term (current) drug therapy: Secondary | ICD-10-CM | POA: Diagnosis not present

## 2015-01-24 DIAGNOSIS — D259 Leiomyoma of uterus, unspecified: Secondary | ICD-10-CM | POA: Insufficient documentation

## 2015-01-24 DIAGNOSIS — Z87891 Personal history of nicotine dependence: Secondary | ICD-10-CM | POA: Diagnosis not present

## 2015-01-24 DIAGNOSIS — M545 Low back pain: Secondary | ICD-10-CM | POA: Diagnosis not present

## 2015-01-24 DIAGNOSIS — F4310 Post-traumatic stress disorder, unspecified: Secondary | ICD-10-CM | POA: Diagnosis not present

## 2015-01-24 DIAGNOSIS — K219 Gastro-esophageal reflux disease without esophagitis: Secondary | ICD-10-CM | POA: Insufficient documentation

## 2015-01-24 DIAGNOSIS — E049 Nontoxic goiter, unspecified: Secondary | ICD-10-CM | POA: Diagnosis not present

## 2015-01-24 DIAGNOSIS — Z7901 Long term (current) use of anticoagulants: Secondary | ICD-10-CM | POA: Diagnosis not present

## 2015-01-24 DIAGNOSIS — R51 Headache: Secondary | ICD-10-CM | POA: Diagnosis not present

## 2015-01-24 DIAGNOSIS — G8929 Other chronic pain: Secondary | ICD-10-CM

## 2015-01-24 DIAGNOSIS — F909 Attention-deficit hyperactivity disorder, unspecified type: Secondary | ICD-10-CM | POA: Insufficient documentation

## 2015-01-24 DIAGNOSIS — R519 Headache, unspecified: Secondary | ICD-10-CM

## 2015-01-24 MED ORDER — GABAPENTIN 100 MG PO CAPS: 100.0000 mg | ORAL_CAPSULE | Freq: Three times a day (TID) | ORAL | Status: DC

## 2015-01-24 MED ORDER — SUMATRIPTAN SUCCINATE 50 MG PO TABS: 50.0000 mg | ORAL_TABLET | ORAL | Status: DC | PRN

## 2015-01-24 NOTE — Progress Notes (Signed)
Patient here for follow up from the ed Patient had fallen in the bathroom hit her head Not sure if she lost consciousness Patient states CT scan was negative

## 2015-01-24 NOTE — Progress Notes (Signed)
MRN: [id number] Name: Melinda [name]  Sex: female Age: 49 y.o. DOB: Jan 30, 1966  Allergies: Penicillins; Rocephin; Shellfish allergy; and Citrus  Chief Complaint  Patient presents with  . Follow-up    ED visit    HPI: Patient is 49 y.o. female who has history of PE currently on  Eliquis, recently was evaluated in the ED after she had a fall with ? Loss of consciousness, EMR reviewed at that time she also had a left arm left-sided rib pain left-sided leg pain, patient had x-rays and CT head and neck Done which was negative for any acute abnormality. On CT scan also reported enlarged thyroid with nodular, patient already had a TFT done  a few months ago had TSH level suppressed  but her free T4 and T3 level was normal, recently patient has gained weight. Patient also history of uterine leiomyoma and is requesting another referral for GYN, she is also requesting refill on her headache medication  Past Medical History  Diagnosis Date  . Migraine   . PTSD (post-traumatic stress disorder)   . Asthma   . GERD (gastroesophageal reflux disease)   . Scoliosis   . Arthritis   . ADHD (attention deficit hyperactivity disorder)   . Peripheral neuropathy   . Iron deficiency anemia due to chronic blood loss 09/09/2014  . Hyperglycemia 09/09/2014  . Low TSH level 09/08/2014  . Menorrhagia 09/08/2014  . Ovarian mass, right 09/09/2014  . PE (pulmonary embolism)     Past Surgical History  Procedure Laterality Date  . Cholecystectomy        Medication List       This list is accurate as of: 01/24/15 12:59 PM.  Always use your most recent med list.               acetaminophen 500 MG tablet  Commonly known as:  TYLENOL  Take 500 mg by mouth every 6 (six) hours as needed (patient takes 1-2 depending on pain level).     apixaban 5 MG Tabs tablet  Commonly known as:  ELIQUIS  Take 1 tablet (5 mg total) by mouth 2 (two) times daily.     butalbital-acetaminophen-caffeine 50-325-40-30 MG per  capsule  Commonly known as:  FIORICET WITH CODEINE  TAKE ONE CAPSULE BY MOUTH EVERY FOUR HOURS AS NEEDED FOR HEADACHE     ciprofloxacin 500 MG tablet  Commonly known as:  CIPRO  Take 1 tablet (500 mg total) by mouth 2 (two) times daily.     diphenhydrAMINE 25 MG tablet  Commonly known as:  BENADRYL  Take 50 mg by mouth at bedtime as needed for sleep (sleep).     ferrous sulfate 325 (65 FE) MG tablet  Take 1 tablet (325 mg total) by mouth 2 (two) times daily with a meal.     gabapentin 100 MG capsule  Commonly known as:  NEURONTIN  Take 1 capsule (100 mg total) by mouth 3 (three) times daily.     HYDROcodone-acetaminophen 5-325 MG per tablet  Commonly known as:  NORCO/VICODIN  Take 1-2 tablets by mouth every 4 (four) hours as needed for moderate pain.     MULTIVITAMIN & MINERAL PO  Take 1 tablet by mouth daily.     SUMAtriptan 50 MG tablet  Commonly known as:  IMITREX  Take 1 tablet (50 mg total) by mouth every 2 (two) hours as needed for migraine or headache. May repeat in 2 hours if headache persists or recurs.  Meds ordered this encounter  Medications  . SUMAtriptan (IMITREX) 50 MG tablet    Sig: Take 1 tablet (50 mg total) by mouth every 2 (two) hours as needed for migraine or headache. May repeat in 2 hours if headache persists or recurs.    Dispense:  10 tablet    Refill:  2  . gabapentin (NEURONTIN) 100 MG capsule    Sig: Take 1 capsule (100 mg total) by mouth 3 (three) times daily.    Dispense:  90 capsule    Refill:  3    PT CALLED OFFICE TO SET UP AN APPOINTMENT    Immunization History  Administered Date(s) Administered  . Influenza,inj,Quad PF,36+ Mos 09/09/2014  . Pneumococcal Polysaccharide-23 09/09/2014  . Pneumococcal-Unspecified 09/09/2014  . Tdap 10/28/2014    Family History  Problem Relation Age of Onset  . Diabetes Mother   . Breast cancer Mother   . CAD Mother   . Hypertension Mother   . Alcohol abuse Father   . Hypertension Father      History  Substance Use Topics  . Smoking status: Former Smoker -- 0.50 packs/day for 40 years    Types: Cigarettes    Quit date: 11/03/2014  . Smokeless tobacco: Never Used  . Alcohol Use: No    Review of Systems   As noted in HPI  Filed Vitals:   01/24/15 1110  BP: 134/86  Pulse: 92  Temp: 98 F (36.7 C)  Resp: 16    Physical Exam  Physical Exam  Constitutional: No distress.  Neck: Thyromegaly present.  Cardiovascular: Normal rate and regular rhythm.   Pulmonary/Chest: Breath sounds normal. No respiratory distress. She has no wheezes. She has no rales.  Abdominal: Soft. There is no tenderness.  Musculoskeletal: She exhibits no edema.    CBC    Component Value Date/Time   WBC 13.5* 11/11/2014 0312   RBC 4.47 11/11/2014 0312   RBC 4.00 09/07/2014 2324   HGB 8.5* 11/11/2014 0312   HCT 31.3* 11/11/2014 0312   PLT 336 11/11/2014 0312   MCV 70.0* 11/11/2014 0312   LYMPHSABS 2.1 11/08/2014 0425   MONOABS 1.1* 11/08/2014 0425   EOSABS 0.0 11/08/2014 0425   BASOSABS 0.0 11/08/2014 0425    CMP     Component Value Date/Time   NA 133* 11/11/2014 0312   K 3.8 11/11/2014 0312   CL 99* 11/11/2014 0312   CO2 24 11/11/2014 0312   GLUCOSE 132* 11/11/2014 0312   BUN <5* 11/11/2014 0312   CREATININE 0.56 11/11/2014 0312   CALCIUM 8.4* 11/11/2014 0312   PROT 6.1* 11/09/2014 0310   ALBUMIN 3.1* 11/09/2014 0310   AST 57* 11/09/2014 0310   ALT 11* 11/09/2014 0310   ALKPHOS 75 11/09/2014 0310   BILITOT 1.0 11/09/2014 0310   GFRNONAA >60 11/11/2014 0312   GFRAA >60 11/11/2014 0312    Lab Results  Component Value Date/Time   CHOL 159 09/08/2014 01:47 AM    Lab Results  Component Value Date/Time   HGBA1C 5.7* 09/08/2014 01:47 AM    Lab Results  Component Value Date/Time   AST 57* 11/09/2014 03:10 AM    Assessment and Plan  Enlarged thyroid - Plan: Ambulatory referral to Endocrinology, US Soft Tissue Head/Neck, Ambulatory referral to  Gynecology  Abnormal TSH - Plan: Ambulatory referral to Endocrinology, will repeat full TFT panel TSH, T4, free, T3, free  Uterine leiomyoma, unspecified location - Plan: Ambulatory referral to Gynecology  Headache, unspecified headache type - Plan: SUMAtriptan (IMITREX)  50 MG tablet  Chronic low back pain - Plan: gabapentin (NEURONTIN) 100 MG capsule    Return in about 3 months (around 04/26/2015), or if symptoms worsen or fail to improve.   This note has been created with Surveyor, quantity. Any transcriptional errors are unintentional.    Lorayne Marek, MD

## 2015-01-25 ENCOUNTER — Telehealth: Payer: Self-pay

## 2015-01-25 LAB — TSH: TSH: 1.575 u[IU]/mL (ref 0.350–4.500)

## 2015-01-25 LAB — T4, FREE: Free T4: 0.94 ng/dL (ref 0.80–1.80)

## 2015-01-25 LAB — T3, FREE: T3 FREE: 3.6 pg/mL (ref 2.3–4.2)

## 2015-01-25 NOTE — Telephone Encounter (Signed)
-----   Message from Lorayne Marek, MD sent at 01/25/2015 11:12 AM EDT ----- Call and let the patient know that her TFT is normal.

## 2015-01-25 NOTE — Telephone Encounter (Signed)
Patient is aware of her lab results 

## 2015-01-30 ENCOUNTER — Telehealth: Payer: Self-pay

## 2015-01-30 ENCOUNTER — Ambulatory Visit (HOSPITAL_COMMUNITY)
Admission: RE | Admit: 2015-01-30 | Discharge: 2015-01-30 | Disposition: A | Discharge status: Home / Self Care | Payer: Medicaid Other | Source: Ambulatory Visit | Attending: Internal Medicine | Admitting: Internal Medicine

## 2015-01-30 DIAGNOSIS — E042 Nontoxic multinodular goiter: Secondary | ICD-10-CM | POA: Insufficient documentation

## 2015-01-30 DIAGNOSIS — E049 Nontoxic goiter, unspecified: Secondary | ICD-10-CM | POA: Diagnosis present

## 2015-01-30 NOTE — Telephone Encounter (Signed)
-----   Message from Lorayne Marek, MD sent at 01/30/2015  4:02 PM EDT ----- Call and let the patient know that her ultrasound of neck reported multiple nodules in the thyroid which likely need biopsy, patient has already been referred to endocrinology for further evaluation.

## 2015-01-30 NOTE — Telephone Encounter (Signed)
Patient is aware of her ultra sound results

## 2015-02-02 ENCOUNTER — Other Ambulatory Visit: Payer: Self-pay | Admitting: Internal Medicine

## 2015-02-22 ENCOUNTER — Ambulatory Visit (INDEPENDENT_AMBULATORY_CARE_PROVIDER_SITE_OTHER): Payer: Medicaid Other | Admitting: Obstetrics & Gynecology

## 2015-02-22 ENCOUNTER — Encounter: Payer: Self-pay | Admitting: Obstetrics & Gynecology

## 2015-02-22 VITALS — BP 143/77 | HR 95 | Temp 99.0°F | Wt 287.2 lb

## 2015-02-22 DIAGNOSIS — N920 Excessive and frequent menstruation with regular cycle: Secondary | ICD-10-CM | POA: Diagnosis not present

## 2015-02-22 DIAGNOSIS — D259 Leiomyoma of uterus, unspecified: Secondary | ICD-10-CM

## 2015-02-22 MED ORDER — MEGESTROL ACETATE 40 MG PO TABS: 40.0000 mg | ORAL_TABLET | Freq: Two times a day (BID) | ORAL | Status: DC

## 2015-02-22 NOTE — Progress Notes (Signed)
Patient ID: Melinda [name], female   DOB: 1965/09/26, 49 y.o.   MRN: [id number] History:  49 y.o. G1P0100 here today for eval of heavy bleeding. Pt bleeds 10-14 days.  She reports that she has heavy bleeding with clots between cycles.  She reports that the bleeding causes 'sanitary issues'.  Pt had PE [name] 2016.Pt reports pain with her cycles.  She reports that she and her daughter have decided that she should have a hysterectomy.  She was eval by Ir for a [place] but, declined that procedure.  She had a PE and DVT 3 months prev and is on anticoagulation.  She had a PAP and endobx from Dr. Ruthann Cancer (?) which were reportedly normal..   The following portions of the patient's history were reviewed and updated as appropriate: allergies, current medications, past family history, past medical history, past social history, past surgical history and problem list.  Past Medical History  Diagnosis Date  . Migraine   . PTSD (post-traumatic stress disorder)   . Asthma   . GERD (gastroesophageal reflux disease)   . Scoliosis   . Arthritis   . ADHD (attention deficit hyperactivity disorder)   . Peripheral neuropathy   . Iron deficiency anemia due to chronic blood loss 09/09/2014  . Hyperglycemia 09/09/2014  . Low TSH level 09/08/2014  . Menorrhagia 09/08/2014  . Ovarian mass, right 09/09/2014  . PE (pulmonary embolism)   . Lumbar herniated disc   . Multiple thyroid nodules    Current Outpatient Prescriptions on File Prior to Visit  Medication Sig Dispense Refill  . acetaminophen (TYLENOL) 500 MG tablet Take 500 mg by mouth every 6 (six) hours as needed (patient takes 1-2 depending on pain level).    Marland Kitchen apixaban (ELIQUIS) 5 MG TABS tablet Take 1 tablet (5 mg total) by mouth 2 (two) times daily. 60 tablet 2  . diphenhydrAMINE (BENADRYL) 25 MG tablet Take 50 mg by mouth at bedtime as needed for sleep (sleep).    . ferrous sulfate 325 (65 FE) MG tablet Take 1 tablet (325 mg total) by mouth 2 (two) times daily with a  meal. 60 tablet 1  . gabapentin (NEURONTIN) 100 MG capsule Take 1 capsule (100 mg total) by mouth 3 (three) times daily. 90 capsule 3  . Multiple Vitamins-Minerals (MULTIVITAMIN & MINERAL PO) Take 1 tablet by mouth daily.    . SUMAtriptan (IMITREX) 50 MG tablet Take 1 tablet (50 mg total) by mouth every 2 (two) hours as needed for migraine or headache. [name] repeat in 2 hours if headache persists or recurs. 10 tablet 2   No current facility-administered medications on file prior to visit.     Review of Systems:  Pertinent items are noted in HPI.  Objective:  Physical Exam Blood pressure 143/77, pulse 95, temperature 99 F (37.2 C), weight 287 lb 3.2 oz (130.273 kg), last menstrual period 02/01/2015. Gen: NAD Abd: morbidly obese;  soft, nontender and nondistended Pelvic: Normal appearing external genitalia; normal appearing vaginal mucosa and cervix.  Normal discharge.  Small uterus, no other palpable masses, no uterine or adnexal tenderness  Labs and Imaging 3/10/206 CLINICAL DATA: Menorrhagia.  EXAM: TRANSABDOMINAL AND TRANSVAGINAL ULTRASOUND OF PELVIS  TECHNIQUE: Both transabdominal and transvaginal ultrasound examinations of the pelvis were performed. Transabdominal technique was performed for global imaging of the pelvis including uterus, ovaries, adnexal regions, and pelvic cul-de-sac. It was necessary to proceed with endovaginal exam following the transabdominal exam to visualize the endometrium and ovaries.  COMPARISON: None  FINDINGS:  Uterus  Measurements: 15.6 x 9.0 x 12.3 cm. An intramural fibroid in the posterior, mid to lower uterine segment measures 6.3 x 6.4 x 5.8 cm. A larger fibroid in the mid uterus measures 6.6 x 6.9 x 6.8 cm, and it is unclear whether this reflects an intramural fibroid distorting the endometrium or potentially a submucosal fibroid.  Endometrium  Not well evaluated due to distortion by the fibroids.  Right  ovary  Measurements: 4.5 x 2.4 x 2.5 cm. A 2.5 x 1.7 x 1.5 cm lesion demonstrates central hypo echogenicity with surrounding hyperechogenicity/thick wall with mild-to-moderate peripheral blood flow.  Left ovary  Not visualized.  Other findings  Trace pelvic free fluid.  IMPRESSION: 1. Two large fibroids. The endometrium is distorted by the fibroids, and it is unclear whether 1 of the fibroids is intramural or submucosal. 2. 2.5 cm thick-walled lesion in the right ovary as above. This [name] represent a corpus luteum, but a follow-up ultrasound is recommended in 6 weeks to assess for resolution. 3. Nonvisualization of the left ovary.  12/21/2014 CLINICAL DATA: Pre [place], evaluate uterine fibroids  EXAM: MRI PELVIS WITHOUT AND WITH CONTRAST  TECHNIQUE: Multiplanar multisequence MR imaging of the pelvis was performed both before and after administration of intravenous contrast.  CONTRAST: 66mL MULTIHANCE GADOBENATE DIMEGLUMINE 529 MG/ML IV SOLN  COMPARISON: Pelvic ultrasound dated 09/08/2014  FINDINGS: Uterus is enlarged, measuring 12.1 x 10.5 x 13.5 cm.  Multiple uterine fibroids, including:  --8.7 x 8.0 x 7.8 cm intramural fibroid with dominant mucosal component in the left uterine fundus (series 4/ image 16)  --5.8 x 6.7 x 6.4 cm intramural fibroid with small submucosal component in the right lower uterine body (series 4/image 8)  --Additional submucosal fibroids measuring up to 2.9 cm, proximally 5-6 in number (series 4/images 12-14)  All fibroids uniformly enhance following contrast administration.  Endometrial complex is distorted/ displaced by multiple uterine fibroids.  Right ovary measures 1.4 x 2.5 x 2.5 cm (series 6/image 15) and is within normal limits.  Left ovary measures 2.9 x 2.3 x 2.3 cm (series 6/image 8) and is notable for a dominant corpus luteal cyst.  No pelvic ascites.  No suspicious pelvic  lymphadenopathy.  Degenerative changes with posterior disc extrusion at L5-S1 (series 4/image 12). IMPRESSION: Multiple uterine fibroids, including a dominant 8.7 cm intramural fibroid with mucosal component in the left uterine fundus, as above. All fibroids enhance following contrast administration.  Assessment & Plan:  Symptomatic fibroids. D/w Dr. Jeronimo Norma (IR) re UEA. Pt is currently on anticoagulants for a recent PE.   She is interested in a hyst but, she is not stable now for surgery and has multiple surgical risk factors.   I have reviewed this with the pt and offered her Megace.  She agrees to try this to control her sx.   Megace 40mg  bid f/u in 3 months or sooner prn Need results of PAP and Endobx from Dr. Theodosia Quay ofc  [place]. Harraway-Smith, M.D., Cherlynn June

## 2015-02-22 NOTE — Patient Instructions (Addendum)
Uterine Fibroid A uterine fibroid is a growth (tumor) that occurs in your uterus. This type of tumor is not cancerous and does not spread out of the uterus. You can have one or many fibroids. Fibroids can vary in size, weight, and where they grow in the uterus. Some can become quite large. Most fibroids do not require medical treatment, but some can cause pain or heavy bleeding during and between periods. CAUSES  A fibroid is the result of a single uterine cell that keeps growing (unregulated), which is different than most cells in the human body. Most cells have a control mechanism that keeps them from reproducing without control.  SIGNS AND SYMPTOMS   Bleeding.  Pelvic pain and pressure.  Bladder problems due to the size of the fibroid.  Infertility and miscarriages depending on the size and location of the fibroid. DIAGNOSIS  Uterine fibroids are diagnosed through a physical exam. Your health care provider may feel the lumpy tumors during a pelvic exam. Ultrasonography may be done to get information regarding size, location, and number of tumors.  TREATMENT   Your health care provider may recommend watchful waiting. This involves getting the fibroid checked by your health care provider to see if it grows or shrinks.   Hormone treatment or an intrauterine device (IUD) may be prescribed.   Surgery may be needed to remove the fibroids (myomectomy) or the uterus (hysterectomy). This depends on your situation. When fibroids interfere with fertility and a woman wants to become pregnant, a health care provider may recommend having the fibroids removed.  [place] care depends on how you were treated. In general:   Keep all follow-up appointments with your health care provider.   Only take over-the-counter or prescription medicines as directed by your health care provider. If you were prescribed a hormone treatment, take the hormone medicines exactly as directed. Do not  take aspirin. It can cause bleeding.   Talk to your health care provider about taking iron pills.  If your periods are troublesome but not so heavy, lie down with your feet raised slightly above your heart. Place cold packs on your lower abdomen.   If your periods are heavy, write down the number of pads or tampons you use per month. Bring this information to your health care provider.   Include green vegetables in your diet.  SEEK IMMEDIATE MEDICAL CARE IF:  You have pelvic pain or cramps not controlled with medicines.   You have a sudden increase in pelvic pain.   You have an increase in bleeding between and during periods.   You have excessive periods and soak tampons or pads in a half hour or less.  You feel lightheaded or have fainting episodes. Document Released: 06/14/2000 Document Revised: 04/07/2013 Document Reviewed: 01/14/2013 [facility] Patient Information 2015 [place], [place]. This information is not intended to replace advice given to you by your health care provider. Make sure you discuss any questions you have with your health care provider. Hysterectomy Information  A hysterectomy is a surgery in which your uterus is removed. This surgery may be done to treat various medical problems. After the surgery, you will no longer have menstrual periods. The surgery will also make you unable to become pregnant (sterile). The fallopian tubes and ovaries can be removed (bilateral salpingo-oophorectomy) during this surgery as well.  REASONS FOR A HYSTERECTOMY  Persistent, abnormal bleeding.  Lasting (chronic) pelvic pain or infection.  The lining of the uterus (endometrium) starts growing outside  the uterus (endometriosis).  The endometrium starts growing in the muscle of the uterus (adenomyosis).  The uterus falls down into the vagina (pelvic organ prolapse).  Noncancerous growths in the uterus (uterine fibroids) that cause symptoms.  Precancerous cells.  Cervical  cancer or uterine cancer. TYPES OF HYSTERECTOMIES  Supracervical hysterectomy--In this type, the top part of the uterus is removed, but not the cervix.  Total hysterectomy--The uterus and cervix are removed.  Radical hysterectomy--The uterus, the cervix, and the fibrous tissue that holds the uterus in place in the pelvis (parametrium) are removed. WAYS A HYSTERECTOMY CAN BE PERFORMED  Abdominal hysterectomy--A large surgical cut (incision) is made in the abdomen. The uterus is removed through this incision.  Vaginal hysterectomy--An incision is made in the vagina. The uterus is removed through this incision. There are no abdominal incisions.  Conventional laparoscopic hysterectomy--Three or four small incisions are made in the abdomen. A thin, lighted tube with a camera (laparoscope) is inserted into one of the incisions. Other tools are put through the other incisions. The uterus is cut into small pieces. The small pieces are removed through the incisions, or they are removed through the vagina.  Laparoscopically assisted vaginal hysterectomy (LAVH)--Three or four small incisions are made in the abdomen. Part of the surgery is performed laparoscopically and part vaginally. The uterus is removed through the vagina.  Robot-assisted laparoscopic hysterectomy--A laparoscope and other tools are inserted into 3 or 4 small incisions in the abdomen. A computer-controlled device is used to give the surgeon a 3D image and to help control the surgical instruments. This allows for more precise movements of surgical instruments. The uterus is cut into small pieces and removed through the incisions or removed through the vagina. RISKS AND COMPLICATIONS  Possible complications associated with this procedure include:  Bleeding and risk of blood transfusion. Tell your health care provider if you do not want to receive any blood products.  Blood clots in the legs or lung.  Infection.  Injury to  surrounding organs.  Problems or side effects related to anesthesia.  Conversion to an abdominal hysterectomy from one of the other techniques. WHAT TO EXPECT AFTER A HYSTERECTOMY  You will be given pain medicine.  You will need to have someone with you for the first 3-5 days after you go home.  You will need to follow up with your surgeon in 2-4 weeks after surgery to evaluate your progress.  You may have early menopause symptoms such as hot flashes, night sweats, and insomnia.  If you had a hysterectomy for a problem that was not cancer or not a condition that could lead to cancer, then you no longer need Pap tests. However, even if you no longer need a Pap test, a regular exam is a good idea to make sure no other problems are starting. Document Released: 12/11/2000 Document Revised: 04/07/2013 Document Reviewed: 02/22/2013 [facility] Patient Information 2015 [place], [place]. This information is not intended to replace advice given to you by your health care provider. Make sure you discuss any questions you have with your health care provider.

## 2015-02-28 ENCOUNTER — Other Ambulatory Visit: Payer: Self-pay | Admitting: Internal Medicine

## 2015-02-28 ENCOUNTER — Other Ambulatory Visit: Payer: Self-pay | Admitting: Family Medicine

## 2015-03-13 ENCOUNTER — Telehealth: Payer: Self-pay

## 2015-03-13 DIAGNOSIS — D500 Iron deficiency anemia secondary to blood loss (chronic): Secondary | ICD-10-CM

## 2015-03-13 MED ORDER — APIXABAN 5 MG PO TABS: 5.0000 mg | ORAL_TABLET | Freq: Two times a day (BID) | ORAL | Status: DC

## 2015-03-13 MED ORDER — FERROUS SULFATE 325 (65 FE) MG PO TABS: 325.0000 mg | ORAL_TABLET | Freq: Two times a day (BID) | ORAL | Status: DC

## 2015-03-13 NOTE — Telephone Encounter (Signed)
Patient came into office requesting refills on her iron and eliquis Prescriptions sent to [facility]

## 2015-03-14 ENCOUNTER — Other Ambulatory Visit: Payer: Self-pay

## 2015-03-14 DIAGNOSIS — R519 Headache, unspecified: Secondary | ICD-10-CM

## 2015-03-14 DIAGNOSIS — R51 Headache: Principal | ICD-10-CM

## 2015-03-14 MED ORDER — SUMATRIPTAN SUCCINATE 50 MG PO TABS: 50.0000 mg | ORAL_TABLET | ORAL | Status: DC | PRN

## 2015-03-15 MED ORDER — SUMATRIPTAN SUCCINATE 50 MG PO TABS: 50.0000 mg | ORAL_TABLET | ORAL | Status: DC | PRN

## 2015-03-15 NOTE — Telephone Encounter (Signed)
LVM to return call   Please let Pt know Rx Imitrex at front office

## 2015-03-15 NOTE — Telephone Encounter (Signed)
Patient returned call. Please f/u

## 2015-04-13 ENCOUNTER — Ambulatory Visit: Payer: Medicaid Other | Attending: Family Medicine | Admitting: Family Medicine

## 2015-04-13 ENCOUNTER — Encounter: Payer: Self-pay | Admitting: Family Medicine

## 2015-04-13 VITALS — BP 129/84 | HR 104 | Temp 99.0°F | Resp 16 | Ht 70.0 in | Wt 297.0 lb

## 2015-04-13 DIAGNOSIS — E042 Nontoxic multinodular goiter: Secondary | ICD-10-CM | POA: Diagnosis not present

## 2015-04-13 DIAGNOSIS — Z23 Encounter for immunization: Secondary | ICD-10-CM | POA: Diagnosis not present

## 2015-04-13 DIAGNOSIS — J4521 Mild intermittent asthma with (acute) exacerbation: Secondary | ICD-10-CM

## 2015-04-13 DIAGNOSIS — I671 Cerebral aneurysm, nonruptured: Secondary | ICD-10-CM | POA: Insufficient documentation

## 2015-04-13 DIAGNOSIS — E039 Hypothyroidism, unspecified: Secondary | ICD-10-CM | POA: Diagnosis present

## 2015-04-13 DIAGNOSIS — R946 Abnormal results of thyroid function studies: Secondary | ICD-10-CM

## 2015-04-13 DIAGNOSIS — J4530 Mild persistent asthma, uncomplicated: Secondary | ICD-10-CM | POA: Insufficient documentation

## 2015-04-13 DIAGNOSIS — R51 Headache: Secondary | ICD-10-CM | POA: Diagnosis not present

## 2015-04-13 DIAGNOSIS — Z79899 Other long term (current) drug therapy: Secondary | ICD-10-CM | POA: Insufficient documentation

## 2015-04-13 DIAGNOSIS — Z0000 Encounter for general adult medical examination without abnormal findings: Secondary | ICD-10-CM

## 2015-04-13 DIAGNOSIS — Z7902 Long term (current) use of antithrombotics/antiplatelets: Secondary | ICD-10-CM | POA: Insufficient documentation

## 2015-04-13 DIAGNOSIS — R519 Headache, unspecified: Secondary | ICD-10-CM | POA: Insufficient documentation

## 2015-04-13 DIAGNOSIS — Z87891 Personal history of nicotine dependence: Secondary | ICD-10-CM | POA: Insufficient documentation

## 2015-04-13 DIAGNOSIS — J45909 Unspecified asthma, uncomplicated: Secondary | ICD-10-CM | POA: Insufficient documentation

## 2015-04-13 DIAGNOSIS — R7989 Other specified abnormal findings of blood chemistry: Secondary | ICD-10-CM

## 2015-04-13 LAB — TSH: TSH: 1.227 u[IU]/mL (ref 0.350–4.500)

## 2015-04-13 LAB — T4, FREE: Free T4: 0.96 ng/dL (ref 0.80–1.80)

## 2015-04-13 LAB — T3, FREE: T3 FREE: 3.3 pg/mL (ref 2.3–4.2)

## 2015-04-13 MED ORDER — ALBUTEROL SULFATE HFA 108 (90 BASE) MCG/ACT IN AERS: 2.0000 | INHALATION_SPRAY | Freq: Four times a day (QID) | RESPIRATORY_TRACT | Status: DC | PRN

## 2015-04-13 MED ORDER — CETIRIZINE HCL 10 MG PO TABS: 10.0000 mg | ORAL_TABLET | Freq: Every day | ORAL | Status: DC

## 2015-04-13 MED ORDER — BUTALBITAL-APAP-CAFFEINE 50-325-40 MG PO TABS: 1.0000 | ORAL_TABLET | Freq: Three times a day (TID) | ORAL | Status: DC | PRN

## 2015-04-13 NOTE — Patient Instructions (Addendum)
Melinda [name] was seen today for hypothyroidism and follow-up.  Diagnoses and all orders for this visit:  Healthcare maintenance -     Flu Vaccine QUAD 36+ mos IM  Multinodular goiter -     TSH -     T3, Free -     T4, free; Future  Low TSH level -     TSH -     T3, Free -     T4, free; Future  Asthma, mild intermittent, with acute exacerbation -     albuterol (PROVENTIL HFA;VENTOLIN HFA) 108 (90 BASE) MCG/ACT inhaler; Inhale 2 puffs into the lungs every 6 (six) hours as needed for wheezing or shortness of breath. -     cetirizine (ZYRTEC) 10 MG tablet; Take 1 tablet (10 mg total) by mouth daily.  Occipital headache -     butalbital-acetaminophen-caffeine (FIORICET) 50-325-40 MG tablet; Take 1-2 tablets by mouth every 8 (eight) hours as needed for headache.   F/u in 3-4 months   Dr. Adrian Blackwater

## 2015-04-13 NOTE — Progress Notes (Signed)
Subjective:  Patient ID: Melinda [name], female    DOB: Jun 15, 1966  Age: 49 y.o. MRN: [id number]  CC: Hypothyroidism and Follow-up   HPI Ree Alcalde presents for   1. Elevated TSH: no previous treatment. Last check was normal. Has goiter. Has an upcoming endo appt.  2. Fibroids: bleeding since 03/2015. Has upcoming appt with gyn. Taking iron.   3. Headache; has hx of migraine and cluster HA. HA in back of head x one day. No  Nausea, vision changes or emesis.   4. Asthma: controlled x 5 years. Reports wheezing no smoking or regular smoke exposure. Has seasonal allergies.   Social History  Substance Use Topics  . Smoking status: Former Smoker -- 0.50 packs/day for 40 years    Types: Cigarettes    Quit date: 11/03/2014  . Smokeless tobacco: Never Used  . Alcohol Use: No   Outpatient Prescriptions Prior to Visit  Medication Sig Dispense Refill  . acetaminophen (TYLENOL) 500 MG tablet Take 500 mg by mouth every 6 (six) hours as needed (patient takes 1-2 depending on pain level).    Marland Kitchen apixaban (ELIQUIS) 5 MG TABS tablet Take 1 tablet (5 mg total) by mouth 2 (two) times daily. 60 tablet 2  . diphenhydrAMINE (BENADRYL) 25 MG tablet Take 50 mg by mouth at bedtime as needed for sleep (sleep).    . ferrous sulfate 325 (65 FE) MG tablet Take 1 tablet (325 mg total) by mouth 2 (two) times daily with a meal. 60 tablet 1  . gabapentin (NEURONTIN) 100 MG capsule Take 1 capsule (100 mg total) by mouth 3 (three) times daily. 90 capsule 3  . megestrol (MEGACE) 40 MG tablet Take 1 tablet (40 mg total) by mouth 2 (two) times daily. 30 tablet 3  . Multiple Vitamins-Minerals (MULTIVITAMIN & MINERAL PO) Take 1 tablet by mouth daily.    . SUMAtriptan (IMITREX) 50 MG tablet Take 1 tablet (50 mg total) by mouth every 2 (two) hours as needed for migraine or headache. [name] repeat in 2 hours if headache persists or recurs. Do not exceed two doses in 7 day period. 10 tablet 2   No facility-administered  medications prior to visit.    ROS Review of Systems  Constitutional: Negative for fever and chills.  HENT: Positive for congestion.   Eyes: Negative for visual disturbance.  Respiratory: Positive for wheezing. Negative for shortness of breath.   Cardiovascular: Negative for chest pain.  Gastrointestinal: Negative for abdominal pain and blood in stool.  Musculoskeletal: Negative for back pain and arthralgias.  Skin: Negative for rash.  Allergic/Immunologic: Negative for immunocompromised state.  Neurological: Positive for headaches (occipital ).  Hematological: Negative for adenopathy. Does not bruise/bleed easily.  Psychiatric/Behavioral: Negative for suicidal ideas and dysphoric mood.  Gad-7: score of 2. 1-2,3   Objective:  BP 129/84 mmHg  Pulse 104  Temp(Src) 99 F (37.2 C) (Oral)  Resp 16  Ht 5\' 10"  (1.778 m)  Wt 297 lb (134.718 kg)  BMI 42.61 kg/m2  SpO2 98%  LMP 03/17/2015  BP/Weight 04/13/2015 02/22/2015 08/26/3333  Systolic BP 456 256 389  Diastolic BP 84 77 86  Wt. (Lbs) 297 287.2 290  BMI 42.61 41.49 41.89   Physical Exam  Constitutional: She is oriented to person, place, and time. She appears well-developed and well-nourished. No distress.  HENT:  Head: Normocephalic and atraumatic.  Right Ear: Tympanic membrane and ear canal normal.  Left Ear: Tympanic membrane, external ear and ear canal normal.  Nose: Mucosal  edema present.  Eyes: Conjunctivae and EOM are normal. Pupils are equal, round, and reactive to light.  Cardiovascular: Normal rate, regular rhythm, normal heart sounds and intact distal pulses.   Pulmonary/Chest: Effort normal and breath sounds normal.  Musculoskeletal: She exhibits no edema.  Neurological: She is alert and oriented to person, place, and time.  Skin: Skin is warm and dry. No rash noted.  Psychiatric: She has a normal mood and affect.     Assessment & Plan:   Problem List Items Addressed This Visit    Asthma   Relevant  Medications   albuterol (PROVENTIL HFA;VENTOLIN HFA) 108 (90 BASE) MCG/ACT inhaler   cetirizine (ZYRTEC) 10 MG tablet   Low TSH level (Chronic)   Relevant Orders   TSH   T3, Free   T4, free   Multinodular goiter (Chronic)   Relevant Orders   TSH   T3, Free   T4, free   Occipital headache   Relevant Medications   butalbital-acetaminophen-caffeine (FIORICET) 50-325-40 MG tablet    Other Visit Diagnoses    Healthcare maintenance    -  Primary    Relevant Orders    Flu Vaccine QUAD 36+ mos IM (Completed)       No orders of the defined types were placed in this encounter.    Follow-up: No Follow-up on file.   Boykin Nearing MD

## 2015-04-26 ENCOUNTER — Telehealth: Payer: Self-pay | Admitting: *Deleted

## 2015-04-26 DIAGNOSIS — D259 Leiomyoma of uterus, unspecified: Secondary | ICD-10-CM

## 2015-04-26 DIAGNOSIS — N920 Excessive and frequent menstruation with regular cycle: Secondary | ICD-10-CM

## 2015-04-26 MED ORDER — MEGESTROL ACETATE 40 MG PO TABS: 40.0000 mg | ORAL_TABLET | Freq: Two times a day (BID) | ORAL | Status: DC

## 2015-04-26 NOTE — Telephone Encounter (Signed)
Received message left on nurse line on 04/26/15 at 0937.  Molly from [facility].  States she is calling for a refill on megace 40 mg for patient.  States was last written by Dr. Ihor Dow.  States patient is set up for delivery of medications and tomorrow is the last day they will deliver this week.  States patient would like to have it before the weekend.  Requests a return call.

## 2015-04-26 NOTE — Telephone Encounter (Signed)
Rx refill order received from Dr. Kennon Rounds and e-prescribed to pharmacy. Pt has surgical consult appt @ clinic tomorrow.

## 2015-04-27 ENCOUNTER — Encounter: Payer: Self-pay | Admitting: Obstetrics and Gynecology

## 2015-04-27 ENCOUNTER — Ambulatory Visit (INDEPENDENT_AMBULATORY_CARE_PROVIDER_SITE_OTHER): Payer: Medicaid Other | Admitting: Obstetrics and Gynecology

## 2015-04-27 VITALS — BP 139/76 | HR 92 | Temp 98.0°F | Ht 70.0 in | Wt 305.0 lb

## 2015-04-27 DIAGNOSIS — D259 Leiomyoma of uterus, unspecified: Secondary | ICD-10-CM | POA: Diagnosis not present

## 2015-04-27 NOTE — Progress Notes (Signed)
Patient ID: Melinda Melinda [name], female   DOB: [birth year], 49 y.o.   MRN: [id number] 48 yo G1P0101 presenting today for follow up on her menorrhagia and pelvic pain associated with her fibroid uterus. Patient is currently being treated for a PE (diagnosed in Melinda [name] 2016). She has been medically treated with Megace. Patient reports improvement in her vaginal bleeding which has been reduced with occasional heavy days followed by a few days of spotting. She is still interested in a hysterectomy  Past Medical History  Diagnosis Date  . Migraine   . PTSD (post-traumatic stress disorder)   . Asthma   . GERD (gastroesophageal reflux disease)   . Scoliosis   . Arthritis   . ADHD (attention deficit hyperactivity disorder)   . Peripheral neuropathy ([place])   . Iron deficiency anemia due to chronic blood loss 09/09/2014  . Hyperglycemia 09/09/2014  . Low TSH level 09/08/2014  . Menorrhagia 09/08/2014  . Ovarian mass, right 09/09/2014  . PE (pulmonary embolism)   . Lumbar herniated disc   . Multiple thyroid nodules    Past Surgical History  Procedure Laterality Date  . Cholecystectomy     Family History  Problem Relation Age of Onset  . Diabetes Mother   . Breast cancer Mother   . CAD Mother   . Hypertension Mother   . Alcohol abuse Father   . Hypertension Father    Social History  Substance Use Topics  . Smoking status: Former Smoker -- 0.50 packs/day for 40 years    Types: Cigarettes    Quit date: 11/03/2014  . Smokeless tobacco: Never Used  . Alcohol Use: No   ROS See pertinent in HPI  Blood pressure 139/76, pulse 92, temperature 98 F (36.7 C), temperature source Oral, height 5\' 10"  (1.778 m), weight 305 lb (138.347 kg), last menstrual period 03/17/2015. GENERAL: Well-developed, well-nourished female in no acute distress.  ABDOMEN: Soft, nontender, nondistended. Obese PELVIC: Not performed EXTREMITIES: No cyanosis, clubbing, or edema, 2+ distal pulses.  A/P 49 yo with symptomatic fibroid  uterus - No significant medical changes in the patient's history - Discussed with the patient that in view of her recent PE diagnosis she is not a good surgical candidate - Advised to continue with Megace for now and can increase to TID dosing on heavy days - Patient is scheduled to follow up with Melinda Melinda [name] in 3 months. Advised to discuss end-point of anticoagulation - Patient has also researches and is interested in robotic hysterectomy. Follow up with Melinda Melinda [name] when medically cleared

## 2015-05-11 ENCOUNTER — Other Ambulatory Visit: Payer: Self-pay | Admitting: Obstetrics and Gynecology

## 2015-05-11 DIAGNOSIS — N920 Excessive and frequent menstruation with regular cycle: Secondary | ICD-10-CM

## 2015-05-11 DIAGNOSIS — D259 Leiomyoma of uterus, unspecified: Secondary | ICD-10-CM

## 2015-05-11 MED ORDER — MEGESTROL ACETATE 40 MG PO TABS: 40.0000 mg | ORAL_TABLET | Freq: Three times a day (TID) | ORAL | Status: DC

## 2015-06-07 ENCOUNTER — Other Ambulatory Visit: Payer: Self-pay | Admitting: Family Medicine

## 2015-06-13 ENCOUNTER — Other Ambulatory Visit: Payer: Self-pay | Admitting: Internal Medicine

## 2015-06-13 ENCOUNTER — Telehealth: Payer: Self-pay | Admitting: Family Medicine

## 2015-06-13 DIAGNOSIS — I2602 Saddle embolus of pulmonary artery with acute cor pulmonale: Secondary | ICD-10-CM

## 2015-06-13 DIAGNOSIS — E041 Nontoxic single thyroid nodule: Secondary | ICD-10-CM

## 2015-06-13 NOTE — Telephone Encounter (Signed)
[facility] called to request documentation stating patient is ok to come off blood thinners...please follow up

## 2015-06-14 NOTE — Assessment & Plan Note (Signed)
Acute PE diagnosed in 11/08/2014 Patient on eliquis for treatment. Patient is safe to hold eliquis for 3 days prior to US guided thyroid biopsy given she is 7 months post acute PE

## 2015-06-14 NOTE — Telephone Encounter (Signed)
Acute PE diagnosed in 11/08/2014 Patient on eliquis for treatment. Patient is safe to hold eliquis for 2 days prior to US guided thyroid biopsy given she is 7 months post acute PE   I called Baker Janus to let her know, please fax letter to [facility]. Fax # [phone] Suzzanne Cloud   Also, please call patient and inform her of recommendations.

## 2015-06-14 NOTE — Telephone Encounter (Signed)
Form placed in to be faxed by me.  No need to call patient. She will be given instructions when she is given appt details.

## 2015-06-21 ENCOUNTER — Telehealth: Payer: Self-pay | Admitting: Family Medicine

## 2015-06-21 NOTE — Telephone Encounter (Signed)
Fax went through for [phone].

## 2015-07-04 ENCOUNTER — Other Ambulatory Visit (HOSPITAL_COMMUNITY)
Admission: RE | Admit: 2015-07-04 | Discharge: 2015-07-04 | Disposition: A | Discharge status: Home / Self Care | Payer: Medicaid Other | Source: Ambulatory Visit | Attending: Interventional Radiology | Admitting: Interventional Radiology

## 2015-07-04 ENCOUNTER — Ambulatory Visit
Admission: RE | Admit: 2015-07-04 | Discharge: 2015-07-04 | Disposition: A | Discharge status: Home / Self Care | Payer: Medicaid Other | Source: Ambulatory Visit | Attending: Internal Medicine | Admitting: Internal Medicine

## 2015-07-04 DIAGNOSIS — E041 Nontoxic single thyroid nodule: Secondary | ICD-10-CM

## 2015-07-04 DIAGNOSIS — E042 Nontoxic multinodular goiter: Secondary | ICD-10-CM | POA: Diagnosis not present

## 2015-07-14 ENCOUNTER — Encounter (HOSPITAL_COMMUNITY): Payer: Self-pay

## 2015-07-17 ENCOUNTER — Other Ambulatory Visit: Payer: Self-pay | Admitting: Obstetrics and Gynecology

## 2015-07-17 ENCOUNTER — Other Ambulatory Visit: Payer: Self-pay | Admitting: Family Medicine

## 2015-07-24 ENCOUNTER — Encounter (HOSPITAL_COMMUNITY): Payer: Self-pay

## 2015-08-10 ENCOUNTER — Ambulatory Visit (INDEPENDENT_AMBULATORY_CARE_PROVIDER_SITE_OTHER): Payer: Medicaid Other | Admitting: Obstetrics & Gynecology

## 2015-08-10 ENCOUNTER — Encounter: Payer: Self-pay | Admitting: Obstetrics & Gynecology

## 2015-08-10 ENCOUNTER — Inpatient Hospital Stay (HOSPITAL_COMMUNITY)
Admission: AD | Admit: 2015-08-10 | Discharge: 2015-08-14 | DRG: 982 | Disposition: A | Discharge status: Home / Self Care | Payer: Medicaid Other | Source: Ambulatory Visit | Attending: Internal Medicine | Admitting: Internal Medicine

## 2015-08-10 ENCOUNTER — Encounter (HOSPITAL_COMMUNITY): Payer: Self-pay

## 2015-08-10 VITALS — BP 145/96 | HR 144 | Temp 98.0°F | Wt 291.6 lb

## 2015-08-10 DIAGNOSIS — J45909 Unspecified asthma, uncomplicated: Secondary | ICD-10-CM | POA: Diagnosis present

## 2015-08-10 DIAGNOSIS — Z91018 Allergy to other foods: Secondary | ICD-10-CM | POA: Diagnosis not present

## 2015-08-10 DIAGNOSIS — K219 Gastro-esophageal reflux disease without esophagitis: Secondary | ICD-10-CM | POA: Diagnosis present

## 2015-08-10 DIAGNOSIS — D251 Intramural leiomyoma of uterus: Secondary | ICD-10-CM | POA: Diagnosis not present

## 2015-08-10 DIAGNOSIS — Z7901 Long term (current) use of anticoagulants: Secondary | ICD-10-CM | POA: Diagnosis not present

## 2015-08-10 DIAGNOSIS — E1310 Other specified diabetes mellitus with ketoacidosis without coma: Secondary | ICD-10-CM | POA: Diagnosis present

## 2015-08-10 DIAGNOSIS — Z87891 Personal history of nicotine dependence: Secondary | ICD-10-CM | POA: Diagnosis not present

## 2015-08-10 DIAGNOSIS — N938 Other specified abnormal uterine and vaginal bleeding: Secondary | ICD-10-CM | POA: Diagnosis present

## 2015-08-10 DIAGNOSIS — F909 Attention-deficit hyperactivity disorder, unspecified type: Secondary | ICD-10-CM | POA: Diagnosis present

## 2015-08-10 DIAGNOSIS — D509 Iron deficiency anemia, unspecified: Secondary | ICD-10-CM | POA: Diagnosis present

## 2015-08-10 DIAGNOSIS — R000 Tachycardia, unspecified: Secondary | ICD-10-CM | POA: Diagnosis not present

## 2015-08-10 DIAGNOSIS — I10 Essential (primary) hypertension: Secondary | ICD-10-CM | POA: Diagnosis present

## 2015-08-10 DIAGNOSIS — J4530 Mild persistent asthma, uncomplicated: Secondary | ICD-10-CM | POA: Diagnosis present

## 2015-08-10 DIAGNOSIS — D259 Leiomyoma of uterus, unspecified: Secondary | ICD-10-CM

## 2015-08-10 DIAGNOSIS — Z86711 Personal history of pulmonary embolism: Secondary | ICD-10-CM | POA: Diagnosis not present

## 2015-08-10 DIAGNOSIS — Z6841 Body Mass Index (BMI) 40.0 and over, adult: Secondary | ICD-10-CM

## 2015-08-10 DIAGNOSIS — Z91013 Allergy to seafood: Secondary | ICD-10-CM

## 2015-08-10 DIAGNOSIS — N939 Abnormal uterine and vaginal bleeding, unspecified: Secondary | ICD-10-CM | POA: Diagnosis not present

## 2015-08-10 DIAGNOSIS — R7989 Other specified abnormal findings of blood chemistry: Secondary | ICD-10-CM

## 2015-08-10 DIAGNOSIS — Z79899 Other long term (current) drug therapy: Secondary | ICD-10-CM

## 2015-08-10 DIAGNOSIS — M419 Scoliosis, unspecified: Secondary | ICD-10-CM | POA: Diagnosis present

## 2015-08-10 DIAGNOSIS — M1990 Unspecified osteoarthritis, unspecified site: Secondary | ICD-10-CM | POA: Diagnosis present

## 2015-08-10 DIAGNOSIS — Z881 Allergy status to other antibiotic agents status: Secondary | ICD-10-CM | POA: Diagnosis not present

## 2015-08-10 DIAGNOSIS — Z79818 Long term (current) use of other agents affecting estrogen receptors and estrogen levels: Secondary | ICD-10-CM | POA: Diagnosis not present

## 2015-08-10 DIAGNOSIS — D62 Acute posthemorrhagic anemia: Secondary | ICD-10-CM | POA: Diagnosis not present

## 2015-08-10 DIAGNOSIS — Z86718 Personal history of other venous thrombosis and embolism: Secondary | ICD-10-CM | POA: Diagnosis not present

## 2015-08-10 DIAGNOSIS — F4310 Post-traumatic stress disorder, unspecified: Secondary | ICD-10-CM | POA: Diagnosis present

## 2015-08-10 DIAGNOSIS — R0602 Shortness of breath: Secondary | ICD-10-CM

## 2015-08-10 DIAGNOSIS — Z880 Allergy status to penicillin: Secondary | ICD-10-CM

## 2015-08-10 DIAGNOSIS — R739 Hyperglycemia, unspecified: Secondary | ICD-10-CM | POA: Diagnosis present

## 2015-08-10 DIAGNOSIS — J4520 Mild intermittent asthma, uncomplicated: Secondary | ICD-10-CM | POA: Diagnosis not present

## 2015-08-10 DIAGNOSIS — E1110 Type 2 diabetes mellitus with ketoacidosis without coma: Secondary | ICD-10-CM

## 2015-08-10 LAB — CBC
HEMATOCRIT: 42.4 % (ref 36.0–46.0)
HEMATOCRIT: 42.4 % (ref 36.0–46.0)
HEMOGLOBIN: 14 g/dL (ref 12.0–15.0)
Hemoglobin: 13.9 g/dL (ref 12.0–15.0)
MCH: 27.4 pg (ref 26.0–34.0)
MCH: 27.9 pg (ref 26.0–34.0)
MCHC: 32.8 g/dL (ref 30.0–36.0)
MCHC: 33 g/dL (ref 30.0–36.0)
MCV: 83.5 fL (ref 78.0–100.0)
MCV: 84.5 fL (ref 78.0–100.0)
Platelets: 377 10*3/uL (ref 150–400)
Platelets: 335 10*3/uL (ref 150–400)
RBC: 5.08 MIL/uL (ref 3.87–5.11)
RBC: 5.02 MIL/uL (ref 3.87–5.11)
RDW: 16.6 % — AB (ref 11.5–15.5)
RDW: 16.6 % — AB (ref 11.5–15.5)
WBC: 11.9 10*3/uL — ABNORMAL HIGH (ref 4.0–10.5)
WBC: 12.5 10*3/uL — ABNORMAL HIGH (ref 4.0–10.5)

## 2015-08-10 LAB — URINALYSIS, ROUTINE W REFLEX MICROSCOPIC
Ketones, ur: >80 mg/dL — AB
Leukocytes, UA: NEGATIVE
Nitrite: NEGATIVE
PH: 5.5 (ref 5.0–8.0)
Protein, ur: 30 mg/dL — AB
SPECIFIC GRAVITY, URINE: 1.015 (ref 1.005–1.030)

## 2015-08-10 LAB — GLUCOSE, CAPILLARY
GLUCOSE-CAPILLARY: 338 mg/dL — AB (ref 65–99)
GLUCOSE-CAPILLARY: 490 mg/dL — AB (ref 65–99)
Glucose-Capillary: 371 mg/dL — ABNORMAL HIGH (ref 65–99)

## 2015-08-10 LAB — BASIC METABOLIC PANEL
Anion gap: 20 — ABNORMAL HIGH (ref 5–15)
BUN: 9 mg/dL (ref 6–20)
CALCIUM: 9.7 mg/dL (ref 8.9–10.3)
CHLORIDE: 100 mmol/L — AB (ref 101–111)
CO2: 12 mmol/L — AB (ref 22–32)
CREATININE: 1.24 mg/dL — AB (ref 0.44–1.00)
GFR calc Af Amer: 59 mL/min — ABNORMAL LOW (ref >60)
GFR calc non Af Amer: 51 mL/min — ABNORMAL LOW (ref >60)
Glucose, Bld: 677 mg/dL (ref 65–99)
Potassium: 5 mmol/L (ref 3.5–5.1)
Sodium: 132 mmol/L — ABNORMAL LOW (ref 135–145)

## 2015-08-10 LAB — URINE MICROSCOPIC-ADD ON
BACTERIA UA: NONE SEEN
WBC, UA: NONE SEEN WBC/hpf (ref 0–5)

## 2015-08-10 LAB — PREPARE RBC (CROSSMATCH)

## 2015-08-10 LAB — ABO/RH: ABO/RH(D): O POS

## 2015-08-10 LAB — CBG MONITORING, ED: GLUCOSE-CAPILLARY: 475 mg/dL — AB (ref 65–99)

## 2015-08-10 MED ORDER — ONDANSETRON HCL 4 MG/2ML IJ SOLN
4.0000 mg | Freq: Once | INTRAMUSCULAR | Status: AC
  Administered 2015-08-10: 4 mg via INTRAVENOUS
  Filled 2015-08-10: qty 2

## 2015-08-10 MED ORDER — GABAPENTIN 100 MG PO CAPS
100.0000 mg | ORAL_CAPSULE | Freq: Three times a day (TID) | ORAL | Status: DC
  Administered 2015-08-11 – 2015-08-14 (×10): 100 mg via ORAL
  Filled 2015-08-10 (×10): qty 1

## 2015-08-10 MED ORDER — SODIUM CHLORIDE 0.9 % IV BOLUS (SEPSIS): 500.0000 mL | Freq: Once | INTRAVENOUS | Status: DC

## 2015-08-10 MED ORDER — HYDROMORPHONE HCL 1 MG/ML IJ SOLN
1.0000 mg | Freq: Once | INTRAMUSCULAR | Status: AC
  Administered 2015-08-10: 1 mg via INTRAVENOUS
  Filled 2015-08-10: qty 1

## 2015-08-10 MED ORDER — BUTALBITAL-APAP-CAFFEINE 50-325-40 MG PO TABS: 1.0000 | ORAL_TABLET | Freq: Three times a day (TID) | ORAL | Status: DC | PRN

## 2015-08-10 MED ORDER — DIPHENHYDRAMINE HCL 25 MG PO CAPS: 50.0000 mg | ORAL_CAPSULE | Freq: Every evening | ORAL | Status: DC | PRN

## 2015-08-10 MED ORDER — SODIUM CHLORIDE 0.9 % IV SOLN
INTRAVENOUS | Status: DC
  Administered 2015-08-10: 4.2 [IU]/h via INTRAVENOUS
  Filled 2015-08-10: qty 2.5

## 2015-08-10 MED ORDER — SODIUM CHLORIDE 0.9 % IV SOLN: INTRAVENOUS | Status: DC

## 2015-08-10 MED ORDER — SODIUM CHLORIDE 0.9 % IV SOLN
INTRAVENOUS | Status: DC
  Administered 2015-08-10: 23:00:00 via INTRAVENOUS

## 2015-08-10 MED ORDER — SODIUM CHLORIDE 0.9 % IV BOLUS (SEPSIS)
500.0000 mL | Freq: Once | INTRAVENOUS | Status: AC
  Administered 2015-08-10: 500 mL via INTRAVENOUS

## 2015-08-10 MED ORDER — MEGESTROL ACETATE 40 MG PO TABS
40.0000 mg | ORAL_TABLET | Freq: Three times a day (TID) | ORAL | Status: DC
  Administered 2015-08-11 – 2015-08-12 (×5): 40 mg via ORAL
  Filled 2015-08-10 (×8): qty 1

## 2015-08-10 MED ORDER — INSULIN ASPART 100 UNIT/ML ~~LOC~~ SOLN
10.0000 [IU] | Freq: Once | SUBCUTANEOUS | Status: AC
  Administered 2015-08-10: 10 [IU] via SUBCUTANEOUS

## 2015-08-10 MED ORDER — ALBUTEROL SULFATE (2.5 MG/3ML) 0.083% IN NEBU: 3.0000 mL | INHALATION_SOLUTION | Freq: Four times a day (QID) | RESPIRATORY_TRACT | Status: DC | PRN

## 2015-08-10 MED ORDER — MEGESTROL ACETATE 40 MG PO TABS
120.0000 mg | ORAL_TABLET | Freq: Once | ORAL | Status: AC
  Administered 2015-08-10: 120 mg via ORAL
  Filled 2015-08-10: qty 3

## 2015-08-10 MED ORDER — DEXTROSE-NACL 5-0.45 % IV SOLN: INTRAVENOUS | Status: DC

## 2015-08-10 MED ORDER — DEXTROSE-NACL 5-0.45 % IV SOLN
INTRAVENOUS | Status: DC
  Administered 2015-08-11: 05:00:00 via INTRAVENOUS

## 2015-08-10 MED ORDER — SODIUM CHLORIDE 0.9 % IV BOLUS (SEPSIS)
1500.0000 mL | Freq: Once | INTRAVENOUS | Status: AC
  Administered 2015-08-10: 1500 mL via INTRAVENOUS

## 2015-08-10 MED ORDER — FERROUS SULFATE 325 (65 FE) MG PO TABS
325.0000 mg | ORAL_TABLET | Freq: Two times a day (BID) | ORAL | Status: DC
  Administered 2015-08-11 – 2015-08-14 (×6): 325 mg via ORAL
  Filled 2015-08-10 (×6): qty 1

## 2015-08-10 NOTE — Progress Notes (Signed)
Patient ID: Melinda [name], female   DOB: 1966-03-11, 50 y.o.   MRN: [id number] History:  50 y.o. G1P0100 here today for consult for surgery.  Pt has been seen in the past and has been eval for AUB.  She is a poor surgical candidate due to multiple medical problems.  She reports that has had persistent bleeding since her thyroid biopsy.  She reports that for the past 3-4 days she has been bleeding heavy with large clots.  She reports that the blood on her clothes and in shoes is from when she just went to the [facility].  She feesl like the blood is 'pouring out of her.' She also c/o feeling dizzy and lightheaded and says that this is new for her.  The following portions of the patient's history were reviewed and updated as appropriate: allergies, current medications, past family history, past medical history, past social history, past surgical history and problem list.  Review of Systems:  Pertinent items are noted in HPI.  Objective:  Physical Exam Blood pressure 145/96, pulse 144, temperature 98 F (36.7 C), weight 291 lb 9.6 oz (132.269 kg). [id number] weak and pale.    In the bathroom a large amount of blood was noted on the floor and in the toilet that contained large clots.    Labs and Imaging No results found.  Assessment & Plan:  AUB thought due to uterine fibroids.   Suspect symptomatic anemia- given pts sx will have nurse transport to the floor.   Dr. Harolyn Rutherford and the charge nurse in the MAU called and notified  To MAU for eval an d possible admission for PRBCs  Gryffin Altice L. Harraway-Smith, M.D., Cherlynn June

## 2015-08-10 NOTE — ED Provider Notes (Signed)
CSN: [id number]     Arrival date & time 08/10/15  1544 History   First MD Initiated Contact with Patient 08/10/15 2043     Chief Complaint  Patient presents with  . Vaginal Bleeding     (Consider location/radiation/quality/duration/timing/severity/associated sxs/prior Treatment) HPI   Judi Ihnen is a 50 y.o. female who is here for evaluation of high blood sugar found during evaluation for vaginal bleeding, today. Earlier today she had seen her gynecologist, but was felt to be unstable, so was sent for evaluation to the maternity admission unit. While there was determined that her hemoglobin was normal, but her blood should was elevated and her CO2 was low, indicating DKA. He is treated with subcutaneous insulin, IV fluids and sent here for further evaluation. He denies recent fever, chills, nausea, vomiting, chest pain or back pain. Is mild generalized weakness and fatigue that she relates to "iron deficiency anemia". He has been taking iron pills twice a day for about a year. She's never been previously treated for diabetes. There are no other known modifying factors.   Past Medical History  Diagnosis Date  . Migraine   . PTSD (post-traumatic stress disorder)   . Asthma   . GERD (gastroesophageal reflux disease)   . Scoliosis   . Arthritis   . ADHD (attention deficit hyperactivity disorder)   . Peripheral neuropathy ([place])   . Iron deficiency anemia due to chronic blood loss 09/09/2014  . Hyperglycemia 09/09/2014  . Low TSH level 09/08/2014  . Menorrhagia 09/08/2014  . Ovarian mass, right 09/09/2014  . PE (pulmonary embolism)   . Lumbar herniated disc   . Multiple thyroid nodules    Past Surgical History  Procedure Laterality Date  . Cholecystectomy     Family History  Problem Relation Age of Onset  . Diabetes Mother   . Breast cancer Mother   . CAD Mother   . Hypertension Mother   . Alcohol abuse Father   . Hypertension Father    Social History  Substance Use Topics  .  Smoking status: Former Smoker -- 0.50 packs/day for 40 years    Types: Cigarettes    Quit date: 11/03/2014  . Smokeless tobacco: Never Used  . Alcohol Use: No   OB History    Gravida Para Term Preterm AB TAB SAB Ectopic Multiple Living   1 1  1      1      Review of Systems  All other systems reviewed and are negative.     Allergies  Penicillins; Rocephin; Shellfish allergy; and Citrus  Home Medications   Prior to Admission medications   Medication Sig Start Date End Date Taking? Authorizing Provider  acetaminophen (TYLENOL) 500 MG tablet Take 500-1,000 mg by mouth every 6 (six) hours as needed for moderate pain (patient takes 1-2 depending on pain level).    Yes Historical Provider, MD  albuterol (PROVENTIL HFA;VENTOLIN HFA) 108 (90 BASE) MCG/ACT inhaler Inhale 2 puffs into the lungs every 6 (six) hours as needed for wheezing or shortness of breath. 04/13/15  Yes Josalyn Funches, MD  diphenhydrAMINE (BENADRYL) 25 MG tablet Take 50 mg by mouth at bedtime as needed for sleep (sleep).   Yes Historical Provider, MD  ferrous sulfate 325 (65 FE) MG tablet TAKE ONE (1) TABLET BY MOUTH TWO (2) TIMES DAILY WITH A MEAL 07/18/15  Yes Josalyn Funches, MD  gabapentin (NEURONTIN) 100 MG capsule TAKE 1 CAPSULE BY MOUTH THREE TIMES A DAY 07/18/15  Yes Boykin Nearing, MD  megestrol (MEGACE) 40 MG tablet TAKE ONE (1) TABLET BY MOUTH 3 TIMES DAILY 07/17/15  Yes Peggy Constant, MD  Multiple Vitamins-Minerals (MULTIVITAMIN & MINERAL PO) Take 1 tablet by mouth daily.   Yes Historical Provider, MD  SUMAtriptan (IMITREX) 50 MG tablet Take 1 tablet (50 mg total) by mouth every 2 (two) hours as needed for migraine or headache. May repeat in 2 hours if headache persists or recurs. Do not exceed two doses in 7 day period. 03/15/15  Yes Josalyn Funches, MD  butalbital-acetaminophen-caffeine (FIORICET) 50-325-40 MG tablet Take 1-2 tablets by mouth every 8 (eight) hours as needed for headache. Patient not taking:  Reported on 08/10/2015 04/13/15 04/12/16  Boykin Nearing, MD  cetirizine (ZYRTEC) 10 MG tablet Take 1 tablet (10 mg total) by mouth daily. Patient not taking: Reported on 08/10/2015 04/13/15   Josalyn Funches, MD  ELIQUIS 5 MG TABS tablet TAKE ONE (1) TABLET BY MOUTH TWO (2) TIMES DAILY Patient not taking: Reported on 08/10/2015 07/18/15   Josalyn Funches, MD   BP 132/92 mmHg  Pulse 128  Temp(Src) 98.9 F (37.2 C) (Oral)  Resp 21  SpO2 98% Physical Exam  Constitutional: She is oriented to person, place, and time. She appears well-developed. No distress.  She is obese  HENT:  Head: Normocephalic and atraumatic.  Right Ear: External ear normal.  Left Ear: External ear normal.  Eyes: Conjunctivae and EOM are normal. Pupils are equal, round, and reactive to light.  Neck: Normal range of motion and phonation normal. Neck supple.  Cardiovascular: Normal rate, regular rhythm and normal heart sounds.   Pulmonary/Chest: Effort normal and breath sounds normal. She exhibits no bony tenderness.  Abdominal: Soft. She exhibits no distension. There is no tenderness. There is no guarding.  Musculoskeletal: Normal range of motion.  Neurological: She is alert and oriented to person, place, and time. No cranial nerve deficit or sensory deficit. She exhibits normal muscle tone. Coordination normal.  Skin: Skin is warm, dry and intact.  Psychiatric: She has a normal mood and affect. Her behavior is normal. Judgment and thought content normal.  Nursing note and vitals reviewed.   ED Course  Procedures (including critical care time)  Medications  sodium chloride 0.9 % bolus 500 mL (not administered)  0.9 %  sodium chloride infusion (not administered)  sodium chloride 0.9 % bolus 1,500 mL (not administered)  dextrose 5 %-0.45 % sodium chloride infusion (not administered)  insulin regular (NOVOLIN R,HUMULIN R) 250 Units in sodium chloride 0.9 % 250 mL (1 Units/mL) infusion (not administered)  megestrol  (MEGACE) tablet 120 mg (120 mg Oral Given 08/10/15 1647)  sodium chloride 0.9 % bolus 500 mL (500 mLs Intravenous Given 08/10/15 1836)  ondansetron (ZOFRAN) injection 4 mg (4 mg Intravenous Given 08/10/15 1837)  insulin aspart (novoLOG) injection 10 Units (10 Units Subcutaneous Given 08/10/15 1835)  HYDROmorphone (DILAUDID) injection 1 mg (1 mg Intravenous Given 08/10/15 1854)    Patient Vitals for the past 24 hrs:  BP Temp Temp src Pulse Resp SpO2  08/10/15 2038 132/92 mmHg - - (!) 128 21 98 %  08/10/15 1920 142/85 mmHg - - (!) 130 16 -  08/10/15 1838 142/80 mmHg 98.9 F (37.2 C) Oral (!) 130 17 -  08/10/15 1745 - - - 119 - -  08/10/15 1740 - - - (!) 125 - -  08/10/15 1735 - - - (!) 122 - -  08/10/15 1730 - - - 118 - -  08/10/15 1725 - - - 119 - -  08/10/15 1720 - - - (!) 122 - -  08/10/15 1710 - - - 112 - -  08/10/15 1705 - - - (!) 130 - -  08/10/15 1700 - - - (!) 129 - -  08/10/15 1655 - - - (!) 131 - -  08/10/15 1650 - - - (!) 133 - -  08/10/15 1645 - - - (!) 133 - -  08/10/15 1640 - - - (!) 134 - -  08/10/15 1604 - - - - - 99 %  08/10/15 1603 120/90 mmHg 98.6 F (37 C) Oral (!) 131 18 -    9:03 PM Reevaluation with update and discussion. After initial assessment and treatment, an updated evaluation reveals she remains fairly comfortable. Findings with the patient, all questions were answered. Eran Windish L   9:05 PM-Consult complete with hospitalist. Patient case explained and discussed. He agrees to admit patient for further evaluation and treatment. Call ended at 2110   CRITICAL CARE Performed by: Richarda Blade Total critical care time: 35 minutes Critical care time was exclusive of separately billable procedures and treating other patients. Critical care was necessary to treat or prevent imminent or life-threatening deterioration. Critical care was time spent personally by me on the following activities: development of treatment plan with patient and/or surrogate as well as  nursing, discussions with consultants, evaluation of patient's response to treatment, examination of patient, obtaining history from patient or surrogate, ordering and performing treatments and interventions, ordering and review of laboratory studies, ordering and review of radiographic studies, pulse oximetry and re-evaluation of patient's condition.   Labs Review Labs Reviewed  CBC - Abnormal; Notable for the following:    WBC 11.9 (*)    RDW 16.6 (*)    All other components within normal limits  CBC - Abnormal; Notable for the following:    WBC 12.5 (*)    RDW 16.6 (*)    All other components within normal limits  BASIC METABOLIC PANEL - Abnormal; Notable for the following:    Sodium 132 (*)    Chloride 100 (*)    CO2 12 (*)    Glucose, Bld 677 (*)    Creatinine, Ser 1.24 (*)    GFR calc non Af Amer 51 (*)    GFR calc Af Amer 59 (*)    Anion gap 20 (*)    All other components within normal limits  URINALYSIS, ROUTINE W REFLEX MICROSCOPIC (NOT AT [facility]) - Abnormal; Notable for the following:    Color, Urine AMBER (*)    APPearance CLOUDY (*)    Glucose, UA >1000 (*)    Hgb urine dipstick LARGE (*)    Bilirubin Urine SMALL (*)    Ketones, ur >80 (*)    Protein, ur 30 (*)    All other components within normal limits  URINE MICROSCOPIC-ADD ON - Abnormal; Notable for the following:    Squamous Epithelial / LPF 0-5 (*)    All other components within normal limits  GLUCOSE, CAPILLARY - Abnormal; Notable for the following:    Glucose-Capillary 490 (*)    All other components within normal limits  TYPE AND SCREEN  PREPARE RBC (CROSSMATCH)  ABO/RH    Imaging Review No results found. I have personally reviewed and evaluated these images and lab results as part of my medical decision-making.   EKG Interpretation None      MDM   Final diagnoses:  Diabetic ketoacidosis without coma associated with other specified diabetes mellitus ([place])  Elevated serum creatinine  Abnormal vaginal bleeding    New-onset diabetes, with diabetic ketoacidosis. I suspect She has tachycardia with normotension. moderate volume depletion. She will require admission for further evaluation and treatment. Insulin drip started with IV fluid bolus in the emergency department.  Nursing Notes Reviewed/ Care Coordinated Applicable Imaging Reviewed Interpretation of Laboratory Data incorporated into ED treatment   Plan: Admit  Daleen Bo, MD 08/10/15 2132

## 2015-08-10 NOTE — MAU Note (Signed)
Pt reports having vaginal bleeding since September. Went to clinic today and was passing large clots in the bathroom MD sen her up to MAU.

## 2015-08-10 NOTE — ED Notes (Signed)
Pt given cup of water 

## 2015-08-10 NOTE — ED Notes (Signed)
Vitals from carelin, Misty RN134/97, HR 130, 98% RA

## 2015-08-10 NOTE — H&P (Signed)
[facility] History and Physical  Melinda [name] [id number] DOB: Nov 10, 1965 DOA: 08/10/2015  Referring physician: Daleen Bo, M.D. PCP: Minerva Ends, MD   Chief Complaint: High blood sugar.  HPI: Melinda [name] is a 50 y.o. female  with a past medical history of asthma, PTSD, migraine headaches, GERD, scoliosis, osteoarthritis, ADHD, iron deficiency anemia, hyperglycemia, pulmonary embolism (was on anticoagulation until recently, but discontinued due to vaginal bleed), right ovarian mass who was referred to the emergency department from the maternity unit after she was sent by her gynecologist for hemoglobin and glucose check, and was found to be significantly hyperglycemic.  Per patient, she has been having polydipsia, polyuria, occasional dizziness and blurred vision. She is states that she has had hyperglycemia in the past, but has not been diagnosed before with diabetes. Workup in the emergency department shows bile use consistent with DKA.   When seen in the emergency department, the patient was in no acute distress.   Review of Systems:  Constitutional:  Positive chills, fatigue No weight loss, night sweats, Fevers.Marland Kitchen  HEENT:  Occasional headaches, dry mouth. No difficulty swallowing,Tooth/dental problems,Sore throat,  No sneezing, itching, ear ache, nasal congestion, post nasal drip,  Cardio-vascular:  Positive dizziness, palpitations  No chest pain, Orthopnea, PND, swelling in lower extremities, anasarca, GI:  No heartburn, indigestion, abdominal pain, nausea, vomiting, diarrhea, change in bowel habits, loss of appetite  Resp:  No shortness of breath with exertion or at rest. No excess mucus, no productive cough, No non-productive cough, No coughing up of blood.No change in color of mucus.No wheezing.No chest wall deformity  Skin:  no rash or lesions.  GU:  no dysuria, change in color of urine, no urgency or frequency. No flank pain.  Endocrine:  Positive  polyuria, polydipsia and blurred vision.  Musculoskeletal:  No joint pain or swelling. No decreased range of motion. No back pain.  Psych:  No change in mood or affect. No depression or anxiety. No memory loss.   Past Medical History  Diagnosis Date  . Migraine   . PTSD (post-traumatic stress disorder)   . Asthma   . GERD (gastroesophageal reflux disease)   . Scoliosis   . Arthritis   . ADHD (attention deficit hyperactivity disorder)   . Peripheral neuropathy ([place])   . Iron deficiency anemia due to chronic blood loss 09/09/2014  . Hyperglycemia 09/09/2014  . Low TSH level 09/08/2014  . Menorrhagia 09/08/2014  . Ovarian mass, right 09/09/2014  . PE (pulmonary embolism)   . Lumbar herniated disc   . Multiple thyroid nodules    Past Surgical History  Procedure Laterality Date  . Cholecystectomy     Social History:  reports that she quit smoking about 9 months ago. Her smoking use included Cigarettes. She has a 20 pack-year smoking history. She has never used smokeless tobacco. She reports that she uses illicit drugs (Methamphetamines). She reports that she does not drink alcohol.  Allergies  Allergen Reactions  . Ceftriaxone Anaphylaxis  . Penicillins Shortness Of Breath       . Shellfish Allergy Anaphylaxis  . Citrus Rash    Family History  Problem Relation Age of Onset  . Diabetes Mother   . Breast cancer Mother   . CAD Mother   . Hypertension Mother   . Alcohol abuse Father   . Hypertension Father     Prior to Admission medications   Medication Sig Start Date End Date Taking? Authorizing Provider  acetaminophen (TYLENOL) 500 MG tablet Take  500-1,000 mg by mouth every 6 (six) hours as needed for moderate pain (patient takes 1-2 depending on pain level).    Yes Historical Provider, MD  albuterol (PROVENTIL HFA;VENTOLIN HFA) 108 (90 BASE) MCG/ACT inhaler Inhale 2 puffs into the lungs every 6 (six) hours as needed for wheezing or shortness of breath. 04/13/15  Yes Josalyn  Funches, MD  diphenhydrAMINE (BENADRYL) 25 MG tablet Take 50 mg by mouth at bedtime as needed for sleep (sleep).   Yes Historical Provider, MD  ferrous sulfate 325 (65 FE) MG tablet TAKE ONE (1) TABLET BY MOUTH TWO (2) TIMES DAILY WITH A MEAL 07/18/15  Yes Josalyn Funches, MD  gabapentin (NEURONTIN) 100 MG capsule TAKE 1 CAPSULE BY MOUTH THREE TIMES A DAY 07/18/15  Yes Josalyn Funches, MD  megestrol (MEGACE) 40 MG tablet TAKE ONE (1) TABLET BY MOUTH 3 TIMES DAILY 07/17/15  Yes Peggy Constant, MD  Multiple Vitamins-Minerals (MULTIVITAMIN & MINERAL PO) Take 1 tablet by mouth daily.   Yes Historical Provider, MD  SUMAtriptan (IMITREX) 50 MG tablet Take 1 tablet (50 mg total) by mouth every 2 (two) hours as needed for migraine or headache. May repeat in 2 hours if headache persists or recurs. Do not exceed two doses in 7 day period. 03/15/15  Yes Josalyn Funches, MD  butalbital-acetaminophen-caffeine (FIORICET) 50-325-40 MG tablet Take 1-2 tablets by mouth every 8 (eight) hours as needed for headache. Patient not taking: Reported on 08/10/2015 04/13/15 04/12/16  Boykin Nearing, MD  cetirizine (ZYRTEC) 10 MG tablet Take 1 tablet (10 mg total) by mouth daily. Patient not taking: Reported on 08/10/2015 04/13/15   Boykin Nearing, MD  ELIQUIS 5 MG TABS tablet TAKE ONE (1) TABLET BY MOUTH TWO (2) TIMES DAILY Patient not taking: Reported on 08/10/2015 07/18/15   Boykin Nearing, MD   Physical Exam: Filed Vitals:   08/10/15 2130 08/10/15 2145 08/10/15 2200 08/10/15 2250  BP: 128/85 124/79 134/91 147/97  Pulse: 131 124 124 130  Temp:    97.3 F (36.3 C)  TempSrc:    Oral  Resp:    18  Height:    5\' 10"  (1.778 m)  Weight:    134 kg (295 lb 6.7 oz)  SpO2: 97% 100% 100% 98%    Wt Readings from Last 3 Encounters:  08/10/15 134 kg (295 lb 6.7 oz)  08/10/15 132.269 kg (291 lb 9.6 oz)  04/27/15 138.347 kg (305 lb)    General:  Appears calm and comfortable Eyes: PERRL, normal lids, irises & conjunctiva ENT:  grossly normal hearing, lips & tongue, oral mucosa is mildly dry. Neck: no LAD, masses or thyromegaly Cardiovascular: Tachycardic, no m/r/g. No LE edema. Telemetry: Sinus tachycardia at 120 bpm.  Respiratory: CTA bilaterally, no w/r/r. Normal respiratory effort. Abdomen: soft, ntnd Skin: no rash or induration seen on limited exam Musculoskeletal: grossly normal tone BUE/BLE Psychiatric: grossly normal mood and affect, speech fluent and appropriate Neurologic: Awake, alert, oriented 3, grossly non-focal.          Labs on Admission:  Basic Metabolic Panel:  Recent Labs Lab 08/10/15 1721  NA 132*  K 5.0  CL 100*  CO2 12*  GLUCOSE 677*  BUN 9  CREATININE 1.24*  CALCIUM 9.7   CBC:  Recent Labs Lab 08/10/15 1614 08/10/15 1721  WBC 11.9* 12.5*  HGB 13.9 14.0  HCT 42.4 42.4  MCV 83.5 84.5  PLT 377 335    BNP (last 3 results)  Recent Labs  09/08/14 1345  BNP 192.8*  CBG:  Recent Labs Lab 08/10/15 1907 08/10/15 2101 08/10/15 2249  GLUCAP 490* 475* 371*   Echo 11/09/2014 ------------------------------------------------------------------- LV EF: 50% -  ------------------------------------------------------------------- Indications:   Chest pain 786.51.  ------------------------------------------------------------------- History:  Risk factors: Morbidly obese.  ------------------------------------------------------------------- Study Conclusions  - Left ventricle: The cavity size was normal. Wall thickness was normal. Systolic function was mildly reduced. The estimated ejection fraction was = 50%. Mild diffuse hypokinesis with no identifiable regional variations. The study is not technically sufficient to allow evaluation of LV diastolic function.  EKG: Independently reviewed. Vent. rate 133 BPM PR interval 122 ms QRS duration 74 ms QT/QTc 310/461 ms P-R-T axes 42 24 31 Sinus tachycardia Moderate voltage criteria for LVH, may be  normal variant Borderline ECG  no significant changes since previous tracing  Assessment/Plan Principal Problem:   DKA (diabetic ketoacidoses) (HCC) Admit to stepdown. Continue cardiac monitoring. Continue IV fluids. Continue insulin infusion. Monitor CBG every hour. Check magnesium, phosphorus and check BMP periodically. Check hemoglobin A1c.   Active Problems:   Sinus tachycardia (HCC) Continue IV hydration. Follow-up electrolytes. Consider repeat echocardiogram and cardiology consult if it persists after hydration/DKA treatment.     Morbid obesity ([place])  advised about the need for lifestyle modifications, since she was diagnosed with diabetes today.     Fibroid, uterine  H&H is stable. Patient to follow-up with GYN.    Asthma Asymptomatic at this time. Continue albuterol as needed.    History of pulmonary embolism The patient has stopped taking Eliquis. She is currently taking Megace, which puts her at higher risk for recurrence. The patient was made aware of this risk.      Code Status: Full code. DVT Prophylaxis: SCDs Family Communication:  Disposition Plan: Admit for DKA treatment.  Time spent: Over 70 minutes were spent during the process of this admission.  Reubin Milan, MD [facility] Pager [phone].

## 2015-08-10 NOTE — MAU Provider Note (Signed)
Chief Complaint: Vaginal Bleeding   First Provider Initiated Contact with Patient 08/10/15 1640     SUBJECTIVE HPI: Melinda [name] is a 50 y.o. G1P0101 non=pregnant female who was sent to Maternity Admissions from [facility] for heavy vaginal bleeding. Has Hx of same due to fibroids and was being seen for surgical consult for hysterectomy and was bleeding very heavily, passing clots and tachyardic at visit. Dr. Ihor Dow performed pelvic exam nad observed heavy bleeding. Pt's bleeding had stopped while on Meagace 40 mg TID. Was decrease to 40 mg BID. Started bleeding again ~07/04/15 and has been bleeding heavily x 3 days.    Complex medical Hx including PE, fibroids, chronic anemia of blood loss. Had been on Eliquis post-PE, but has been off since July 01, 2015.    Location: low abd Quality: cramping Severity: moderate Duration: 3 days Context: w/ heavy bleeding and passing clots Timing: intermittent Modifying factors: No improvement w/ Tylenol  Associated signs and symptoms: Neg for fever, chills. Pos for dizziness, weakness, blurry vision, heavy bleeding. Neg for GI or urinary complaints.   Past Medical History  Diagnosis Date  . Migraine   . PTSD (post-traumatic stress disorder)   . Asthma   . GERD (gastroesophageal reflux disease)   . Scoliosis   . Arthritis   . ADHD (attention deficit hyperactivity disorder)   . Peripheral neuropathy ([place])   . Iron deficiency anemia due to chronic blood loss 09/09/2014  . Hyperglycemia 09/09/2014  . Low TSH level 09/08/2014  . Menorrhagia 09/08/2014  . Ovarian mass, right 09/09/2014  . PE (pulmonary embolism)   . Lumbar herniated disc   . Multiple thyroid nodules    OB History  Gravida Para Term Preterm AB SAB TAB Ectopic Multiple Living  1 1  1      1     # Outcome Date GA Lbr Len/2nd Weight Sex Delivery Anes PTL Lv  1 Preterm 1983     Vag-Spont        Comments: Induced for high blood pressure     Past Surgical History  Procedure  Laterality Date  . Cholecystectomy     Social History   Social History  . Marital Status: Single    Spouse Name: N/A  . Number of Children: N/A  . Years of Education: N/A   Occupational History  . Not on file.   Social History Main Topics  . Smoking status: Former Smoker -- 0.50 packs/day for 40 years    Types: Cigarettes    Quit date: 11/03/2014  . Smokeless tobacco: Never Used  . Alcohol Use: No  . Drug Use: Yes    Special: Methamphetamines     Comment: 8/24/16per patient does not use methamphetamine  . Sexual Activity: No   Other Topics Concern  . Not on file   Social History Narrative   No current facility-administered medications on file prior to encounter.   Current Outpatient Prescriptions on File Prior to Encounter  Medication Sig Dispense Refill  . acetaminophen (TYLENOL) 500 MG tablet Take 500-1,000 mg by mouth every 6 (six) hours as needed for moderate pain (patient takes 1-2 depending on pain level).     Marland Kitchen albuterol (PROVENTIL HFA;VENTOLIN HFA) 108 (90 BASE) MCG/ACT inhaler Inhale 2 puffs into the lungs every 6 (six) hours as needed for wheezing or shortness of breath. 1 Inhaler 0  . diphenhydrAMINE (BENADRYL) 25 MG tablet Take 50 mg by mouth at bedtime as needed for sleep (sleep).    . ferrous sulfate 325 (  65 FE) MG tablet TAKE ONE (1) TABLET BY MOUTH TWO (2) TIMES DAILY WITH A MEAL 60 tablet 0  . gabapentin (NEURONTIN) 100 MG capsule TAKE 1 CAPSULE BY MOUTH THREE TIMES A DAY 90 capsule 0  . megestrol (MEGACE) 40 MG tablet TAKE ONE (1) TABLET BY MOUTH 3 TIMES DAILY 90 tablet 1  . Multiple Vitamins-Minerals (MULTIVITAMIN & MINERAL PO) Take 1 tablet by mouth daily.    . SUMAtriptan (IMITREX) 50 MG tablet Take 1 tablet (50 mg total) by mouth every 2 (two) hours as needed for migraine or headache. May repeat in 2 hours if headache persists or recurs. Do not exceed two doses in 7 day period. 10 tablet 2  . butalbital-acetaminophen-caffeine (FIORICET) 50-325-40 MG  tablet Take 1-2 tablets by mouth every 8 (eight) hours as needed for headache. (Patient not taking: Reported on 08/10/2015) 20 tablet 0  . cetirizine (ZYRTEC) 10 MG tablet Take 1 tablet (10 mg total) by mouth daily. (Patient not taking: Reported on 08/10/2015) 30 tablet 11  . ELIQUIS 5 MG TABS tablet TAKE ONE (1) TABLET BY MOUTH TWO (2) TIMES DAILY (Patient not taking: Reported on 08/10/2015) 60 tablet 2   Allergies  Allergen Reactions  . Penicillins Shortness Of Breath    Has patient had a PCN reaction causing immediate rash, facial/tongue/throat swelling, SOB or lightheadedness with hypotension: Yes Has patient had a PCN reaction causing severe rash involving mucus membranes or skin necrosis: No Has patient had a PCN reaction that required hospitalization No Has patient had a PCN reaction occurring within the last 10 years: No If all of the above answers are "NO", then may proceed with Cephalosporin use.   . Rocephin [Ceftriaxone Sodium In Dextrose] Anaphylaxis  . Shellfish Allergy Anaphylaxis  . Citrus Rash    I have reviewed the past Medical Hx, Surgical Hx, Social Hx, Allergies and Medications.   Review of Systems  Constitutional: Positive for fatigue. Negative for fever, chills and appetite change.  Eyes: Positive for visual disturbance (blurry vision).  Respiratory: Negative for cough, chest tightness and shortness of breath.   Cardiovascular: Negative for chest pain and leg swelling.  Gastrointestinal: Positive for nausea, vomiting (x 1) and abdominal pain. Negative for diarrhea and constipation.  Genitourinary: Positive for vaginal bleeding. Negative for dysuria and flank pain.  Musculoskeletal: Negative for back pain.  Neurological: Positive for dizziness, weakness and light-headedness.    OBJECTIVE Patient Vitals for the past 24 hrs:  BP Temp Temp src Pulse Resp SpO2  08/10/15 1838 142/80 mmHg 98.9 F (37.2 C) Oral (!) 130 17 -  08/10/15 1745 - - - 119 - -  08/10/15 1740 - -  - (!) 125 - -  08/10/15 1735 - - - (!) 122 - -  08/10/15 1730 - - - 118 - -  08/10/15 1725 - - - 119 - -  08/10/15 1720 - - - (!) 122 - -  08/10/15 1710 - - - 112 - -  08/10/15 1705 - - - (!) 130 - -  08/10/15 1700 - - - (!) 129 - -  08/10/15 1655 - - - (!) 131 - -  08/10/15 1650 - - - (!) 133 - -  08/10/15 1645 - - - (!) 133 - -  08/10/15 1640 - - - (!) 134 - -  08/10/15 1604 - - - - - 99 %  08/10/15 1603 120/90 mmHg 98.6 F (37 C) Oral (!) 131 18 -   Constitutional: Well-developed, well-nourished morbidly  obese female in no acute distress. No pallor. Occasionally drowsy. Cardiovascular: tachycardia, no M/R/G Respiratory: normal rate and effort.  GI: Abd soft, non-tender. Neurologic: Alert and oriented x 4.  GU: Deferred due to recent exam. Moderate BRB on pad.   LAB RESULTS Results for orders placed or performed during the hospital encounter of 08/10/15 (from the past 24 hour(s))  CBC     Status: Abnormal   Collection Time: 08/10/15  4:14 PM  Result Value Ref Range   WBC 11.9 (H) 4.0 - 10.5 K/uL   RBC 5.08 3.87 - 5.11 MIL/uL   Hemoglobin 13.9 12.0 - 15.0 g/dL   HCT 42.4 36.0 - 46.0 %   MCV 83.5 78.0 - 100.0 fL   MCH 27.4 26.0 - 34.0 pg   MCHC 32.8 30.0 - 36.0 g/dL   RDW 16.6 (H) 11.5 - 15.5 %   Platelets 377 150 - 400 K/uL  Type and screen     Status: None (Preliminary result)   Collection Time: 08/10/15  4:14 PM  Result Value Ref Range   ABO/RH(D) O POS    Antibody Screen NEG    Sample Expiration 08/13/2015    Unit Number [id number]    Blood Component Type RED CELLS,LR    Unit division 00    Status of Unit ALLOCATED    Transfusion Status OK TO TRANSFUSE    Crossmatch Result Compatible    Unit Number [id number]    Blood Component Type RED CELLS,LR    Unit division 00    Status of Unit ALLOCATED    Transfusion Status OK TO TRANSFUSE    Crossmatch Result Compatible   ABO/Rh     Status: None   Collection Time: 08/10/15  4:14 PM  Result Value Ref Range    ABO/RH(D) O POS   Prepare RBC     Status: None   Collection Time: 08/10/15  4:27 PM  Result Value Ref Range   Order Confirmation ORDER PROCESSED BY BLOOD BANK   CBC     Status: Abnormal   Collection Time: 08/10/15  5:21 PM  Result Value Ref Range   WBC 12.5 (H) 4.0 - 10.5 K/uL   RBC 5.02 3.87 - 5.11 MIL/uL   Hemoglobin 14.0 12.0 - 15.0 g/dL   HCT 42.4 36.0 - 46.0 %   MCV 84.5 78.0 - 100.0 fL   MCH 27.9 26.0 - 34.0 pg   MCHC 33.0 30.0 - 36.0 g/dL   RDW 16.6 (H) 11.5 - 15.5 %   Platelets 335 150 - 400 K/uL  Basic metabolic panel     Status: Abnormal   Collection Time: 08/10/15  5:21 PM  Result Value Ref Range   Sodium 132 (L) 135 - 145 mmol/L   Potassium 5.0 3.5 - 5.1 mmol/L   Chloride 100 (L) 101 - 111 mmol/L   CO2 12 (L) 22 - 32 mmol/L   Glucose, Bld 677 (HH) 65 - 99 mg/dL   BUN 9 6 - 20 mg/dL   Creatinine, Ser 1.24 (H) 0.44 - 1.00 mg/dL   Calcium 9.7 8.9 - 10.3 mg/dL   GFR calc non Af Amer 51 (L) >60 mL/min   GFR calc Af Amer 59 (L) >60 mL/min   Anion gap 20 (H) 5 - 15  Urinalysis, Routine w reflex microscopic (not at [facility])     Status: Abnormal   Collection Time: 08/10/15  5:50 PM  Result Value Ref Range   Color, Urine AMBER (A) YELLOW   APPearance CLOUDY (  A) CLEAR   Specific Gravity, Urine 1.015 1.005 - 1.030   pH 5.5 5.0 - 8.0   Glucose, UA >1000 (A) NEGATIVE mg/dL   Hgb urine dipstick LARGE (A) NEGATIVE   Bilirubin Urine SMALL (A) NEGATIVE   Ketones, ur >80 (A) NEGATIVE mg/dL   Protein, ur 30 (A) NEGATIVE mg/dL   Nitrite NEGATIVE NEGATIVE   Leukocytes, UA NEGATIVE NEGATIVE  Urine microscopic-add on     Status: Abnormal   Collection Time: 08/10/15  5:50 PM  Result Value Ref Range   Squamous Epithelial / LPF 0-5 (A) NONE SEEN   WBC, UA NONE SEEN 0 - 5 WBC/hpf   RBC / HPF TOO NUMEROUS TO COUNT 0 - 5 RBC/hpf   Bacteria, UA NONE SEEN NONE SEEN  Glucose, capillary     Status: Abnormal   Collection Time: 08/10/15  7:07 PM  Result Value Ref Range   Glucose-Capillary  490 (H) 65 - 99 mg/dL    IMAGING No results found.  MAU COURSE CBC, Type & cross 2 Units PRBCs, 500 cc saline bolus, Megace 120 mg PO x 1. (Cannot give Premarin due to Hx PE.)  Hgb 13.9. Concerned for lab error. CBC repeated. BMET, UA added per consult w/ Dr. Harolyn Rutherford. EKG.   Serum Glucose 677. Pt in DKA. Pt denies previous Dx of Diabetes. State she has been told she was hyperglycemic before. Never on meds. Called Dr. Wyvonnia Dusky at [facility] ED to transfer pt. Ordered additional saline bolus and Novolog 10 mg. [facility] called.    MDM 50 year -old female w/ AUB, bleeding now stable w/ finding of DKA of unknown cause. Needs transfer to [facility] for appropriate care.   ASSESSMENT 1. Diabetic ketoacidosis without coma associated with other specified diabetes mellitus ([place])   2. Abnormal uterine bleeding (AUB)     PLAN Transfer to [facility] ED per consult w/ Dr. Harolyn Rutherford and Dr. Wyvonnia Dusky.  Needs to continue Megace: 120 mg PO x 1 tonight, then 80 mg PO TID until bleeding stops, then 40 mg PO TID.   [place], [place] 08/10/2015  7:57 PM

## 2015-08-10 NOTE — ED Notes (Signed)
Pt ambulated to restroom attached to room.

## 2015-08-11 ENCOUNTER — Inpatient Hospital Stay (HOSPITAL_COMMUNITY): Payer: Medicaid Other

## 2015-08-11 LAB — CBC WITH DIFFERENTIAL/PLATELET
BASOS ABS: 0 10*3/uL (ref 0.0–0.1)
Basophils Relative: 0 %
EOS ABS: 0.1 10*3/uL (ref 0.0–0.7)
EOS PCT: 0 %
HCT: 36.4 % (ref 36.0–46.0)
Hemoglobin: 12.1 g/dL (ref 12.0–15.0)
LYMPHS PCT: 21 %
Lymphs Abs: 2.9 10*3/uL (ref 0.7–4.0)
MCH: 27.6 pg (ref 26.0–34.0)
MCHC: 33.2 g/dL (ref 30.0–36.0)
MCV: 83.1 fL (ref 78.0–100.0)
MONO ABS: 1.1 10*3/uL — AB (ref 0.1–1.0)
Monocytes Relative: 8 %
Neutro Abs: 9.9 10*3/uL — ABNORMAL HIGH (ref 1.7–7.7)
Neutrophils Relative %: 71 %
Platelets: 297 10*3/uL (ref 150–400)
RBC: 4.38 MIL/uL (ref 3.87–5.11)
RDW: 16.9 % — ABNORMAL HIGH (ref 11.5–15.5)
WBC: 13.9 10*3/uL — ABNORMAL HIGH (ref 4.0–10.5)

## 2015-08-11 LAB — GLUCOSE, CAPILLARY
GLUCOSE-CAPILLARY: 250 mg/dL — AB (ref 65–99)
GLUCOSE-CAPILLARY: 184 mg/dL — AB (ref 65–99)
GLUCOSE-CAPILLARY: 186 mg/dL — AB (ref 65–99)
GLUCOSE-CAPILLARY: 143 mg/dL — AB (ref 65–99)
GLUCOSE-CAPILLARY: 319 mg/dL — AB (ref 65–99)
GLUCOSE-CAPILLARY: 319 mg/dL — AB (ref 65–99)
GLUCOSE-CAPILLARY: 356 mg/dL — AB (ref 65–99)
Glucose-Capillary: 135 mg/dL — ABNORMAL HIGH (ref 65–99)
Glucose-Capillary: 181 mg/dL — ABNORMAL HIGH (ref 65–99)
Glucose-Capillary: 182 mg/dL — ABNORMAL HIGH (ref 65–99)
Glucose-Capillary: 186 mg/dL — ABNORMAL HIGH (ref 65–99)
Glucose-Capillary: 223 mg/dL — ABNORMAL HIGH (ref 65–99)
Glucose-Capillary: 256 mg/dL — ABNORMAL HIGH (ref 65–99)
Glucose-Capillary: 330 mg/dL — ABNORMAL HIGH (ref 65–99)
Glucose-Capillary: 481 mg/dL — ABNORMAL HIGH (ref 65–99)

## 2015-08-11 LAB — COMPREHENSIVE METABOLIC PANEL
ALK PHOS: 92 U/L (ref 38–126)
ALT: 16 U/L (ref 14–54)
AST: 13 U/L — ABNORMAL LOW (ref 15–41)
Albumin: 3.2 g/dL — ABNORMAL LOW (ref 3.5–5.0)
Anion gap: 12 (ref 5–15)
BILIRUBIN TOTAL: 0.9 mg/dL (ref 0.3–1.2)
BUN: 6 mg/dL (ref 6–20)
CALCIUM: 8.9 mg/dL (ref 8.9–10.3)
CO2: 16 mmol/L — AB (ref 22–32)
CREATININE: 0.88 mg/dL (ref 0.44–1.00)
Chloride: 112 mmol/L — ABNORMAL HIGH (ref 101–111)
GFR calc non Af Amer: >60 mL/min (ref >60)
GLUCOSE: 230 mg/dL — AB (ref 65–99)
Potassium: 3.9 mmol/L (ref 3.5–5.1)
SODIUM: 140 mmol/L (ref 135–145)
TOTAL PROTEIN: 6 g/dL — AB (ref 6.5–8.1)

## 2015-08-11 LAB — BASIC METABOLIC PANEL
Anion gap: 11 (ref 5–15)
BUN: 7 mg/dL (ref 6–20)
CHLORIDE: 113 mmol/L — AB (ref 101–111)
CO2: 16 mmol/L — AB (ref 22–32)
CREATININE: 0.76 mg/dL (ref 0.44–1.00)
Calcium: 8.7 mg/dL — ABNORMAL LOW (ref 8.9–10.3)
GFR calc Af Amer: >60 mL/min (ref >60)
GFR calc non Af Amer: >60 mL/min (ref >60)
Glucose, Bld: 172 mg/dL — ABNORMAL HIGH (ref 65–99)
POTASSIUM: 3.9 mmol/L (ref 3.5–5.1)
Sodium: 140 mmol/L (ref 135–145)

## 2015-08-11 LAB — MAGNESIUM: Magnesium: 1.5 mg/dL — ABNORMAL LOW (ref 1.7–2.4)

## 2015-08-11 LAB — MRSA PCR SCREENING: MRSA BY PCR: NEGATIVE

## 2015-08-11 LAB — PHOSPHORUS: PHOSPHORUS: 2.1 mg/dL — AB (ref 2.5–4.6)

## 2015-08-11 MED ORDER — INSULIN ASPART 100 UNIT/ML ~~LOC~~ SOLN
0.0000 [IU] | Freq: Three times a day (TID) | SUBCUTANEOUS | Status: DC
  Administered 2015-08-11: 1 [IU] via SUBCUTANEOUS
  Administered 2015-08-11: 7 [IU] via SUBCUTANEOUS
  Administered 2015-08-12 (×2): 5 [IU] via SUBCUTANEOUS

## 2015-08-11 MED ORDER — INSULIN GLARGINE 100 UNIT/ML ~~LOC~~ SOLN
25.0000 [IU] | Freq: Every day | SUBCUTANEOUS | Status: DC
  Administered 2015-08-11 – 2015-08-12 (×2): 25 [IU] via SUBCUTANEOUS
  Filled 2015-08-11 (×2): qty 0.25

## 2015-08-11 MED ORDER — SODIUM CHLORIDE 0.9 % IV BOLUS (SEPSIS)
1000.0000 mL | Freq: Once | INTRAVENOUS | Status: AC
  Administered 2015-08-11: 1000 mL via INTRAVENOUS

## 2015-08-11 MED ORDER — INSULIN ASPART 100 UNIT/ML ~~LOC~~ SOLN: SUBCUTANEOUS | Status: DC

## 2015-08-11 MED ORDER — LIVING WELL WITH DIABETES BOOK
Freq: Once | Status: AC
  Administered 2015-08-11: 13:00:00
  Filled 2015-08-11: qty 1

## 2015-08-11 MED ORDER — POTASSIUM CHLORIDE CRYS ER 20 MEQ PO TBCR
20.0000 meq | EXTENDED_RELEASE_TABLET | Freq: Once | ORAL | Status: AC
  Administered 2015-08-11: 20 meq via ORAL
  Filled 2015-08-11: qty 1

## 2015-08-11 MED ORDER — SODIUM CHLORIDE 0.9 % IV BOLUS (SEPSIS)
500.0000 mL | Freq: Once | INTRAVENOUS | Status: AC
  Administered 2015-08-11: 500 mL via INTRAVENOUS

## 2015-08-11 MED ORDER — METFORMIN HCL 850 MG PO TABS: 850.0000 mg | ORAL_TABLET | Freq: Two times a day (BID) | ORAL | Status: DC

## 2015-08-11 MED ORDER — INSULIN ASPART 100 UNIT/ML ~~LOC~~ SOLN: 0.0000 [IU] | Freq: Three times a day (TID) | SUBCUTANEOUS | Status: DC

## 2015-08-11 MED ORDER — "INSULIN SYRINGE-NEEDLE U-100 25G X 1"" 1 ML MISC": Status: DC

## 2015-08-11 MED ORDER — MAGNESIUM SULFATE IN D5W 10-5 MG/ML-% IV SOLN
1.0000 g | Freq: Once | INTRAVENOUS | Status: AC
  Administered 2015-08-11: 1 g via INTRAVENOUS
  Filled 2015-08-11: qty 100

## 2015-08-11 MED ORDER — METOPROLOL TARTRATE 50 MG PO TABS
50.0000 mg | ORAL_TABLET | Freq: Two times a day (BID) | ORAL | Status: DC
  Administered 2015-08-11 – 2015-08-14 (×6): 50 mg via ORAL
  Filled 2015-08-11 (×6): qty 1

## 2015-08-11 MED ORDER — METFORMIN HCL 500 MG PO TABS
500.0000 mg | ORAL_TABLET | Freq: Two times a day (BID) | ORAL | Status: DC
  Administered 2015-08-11 – 2015-08-12 (×3): 500 mg via ORAL
  Filled 2015-08-11 (×3): qty 1

## 2015-08-11 MED ORDER — FREESTYLE SYSTEM KIT: 1.0000 | PACK | Freq: Three times a day (TID) | Status: DC

## 2015-08-11 MED ORDER — FREESTYLE LANCETS MISC: Status: DC

## 2015-08-11 MED ORDER — INSULIN ASPART 100 UNIT/ML ~~LOC~~ SOLN
0.0000 [IU] | Freq: Every day | SUBCUTANEOUS | Status: DC
  Administered 2015-08-11: 4 [IU] via SUBCUTANEOUS

## 2015-08-11 MED ORDER — MAGNESIUM SULFATE IN D5W 10-5 MG/ML-% IV SOLN
1.0000 g | Freq: Once | INTRAVENOUS | Status: AC
  Filled 2015-08-11: qty 100

## 2015-08-11 MED ORDER — INSULIN GLARGINE 100 UNIT/ML ~~LOC~~ SOLN: 25.0000 [IU] | Freq: Every day | SUBCUTANEOUS | Status: DC

## 2015-08-11 MED ORDER — INSULIN GLARGINE 100 UNIT/ML ~~LOC~~ SOLN
15.0000 [IU] | Freq: Every day | SUBCUTANEOUS | Status: DC
  Filled 2015-08-11: qty 0.15

## 2015-08-11 MED ORDER — INSULIN STARTER KIT- SYRINGES (ENGLISH)
1.0000 | Freq: Once | Status: AC
  Administered 2015-08-11: 1
  Filled 2015-08-11: qty 1

## 2015-08-11 MED ORDER — LACTATED RINGERS IV SOLN
INTRAVENOUS | Status: AC
  Administered 2015-08-11: 17:00:00 via INTRAVENOUS

## 2015-08-11 NOTE — Progress Notes (Addendum)
Patient Demographics:    Melinda [name], is a 50 y.o. female, DOB - Jun 20, 1966, [id number]  Admit date - 08/10/2015   Admitting Physician Reubin Milan, MD  Outpatient Primary MD for the patient is Minerva Ends, MD  LOS - 1   Chief Complaint  Patient presents with  . Vaginal Bleeding        Subjective:    [name] today has, No headache, No chest pain, No abdominal pain - No Nausea, No new weakness tingling or numbness, No Cough - SOB.    Assessment  & Plan :     1.DKA in a newly diagnosed DM2 - pending A1c, was treated with IV insulin drip, IV fluids and supportive care. Anion gap has closed. Has been transitioned to Lantus and sliding scale, Glucophage added, diabetic educator requested to evaluate. Will discharge in the morning if stable. Recheck BMP and magnesium.  2. Reactionary tachycardia and leukocytosis. No signs of infection, UA chest x-ray stable, hydrate and supportive care and monitor.  3. Uterine Fibroids with dysfunctional uterine bleeding. On Megace, following with OB, she is due for hysterectomy in the outpatient setting. We will request OB physician to get the patient off of Megace as soon as possible due to history of DVT PE.  4.Hx of PE - has come off of Eliquis since January, for now SCDs and ambulation for DVT prophylaxis, she had large uterine bleeding yesterday and PCP office therefore will avoid anticoagulant lesion for now. Encouraged to be ambulatory and out of bed throughout the hospital stay.  5. Morbid obesity. Outpatient follow-up with PCP.  6. Hx of Asthma - no wheezing or shortness of breath, supportive care.   7. Essential hypertension. Add Lopressor for better control.    Code Status : Full  Family Communication  : None present  Disposition  Plan  : Home in am  Consults  :  DM educator  Procedures  :    DVT Prophylaxis  :   SCDs    Lab Results  Component Value Date   PLT 297 08/11/2015    Inpatient Medications  Scheduled Meds: . ferrous sulfate  325 mg Oral BID WC  . gabapentin  100 mg Oral TID  . insulin aspart  0-5 Units Subcutaneous QHS  . insulin aspart  0-9 Units Subcutaneous TID WC  . insulin glargine  25 Units Subcutaneous Daily  . megestrol  40 mg Oral TID  . metFORMIN  500 mg Oral BID WC  . metoprolol tartrate  50 mg Oral BID  . sodium chloride  500 mL Intravenous Once   Continuous Infusions: . insulin (NOVOLIN-R) infusion 4.2 Units/hr (08/10/15 2131)   PRN Meds:.albuterol, butalbital-acetaminophen-caffeine, diphenhydrAMINE  Antibiotics  :    Anti-infectives    None        Objective:   Filed Vitals:   08/10/15 2200 08/10/15 2250 08/11/15 0411 08/11/15 0746  BP: 134/91 147/97 131/85 154/80  Pulse: 124 130 120 123  Temp:  97.3 F (36.3 C) 98.6 F (37 C) 98.8 F (37.1 C)  TempSrc:  Oral Oral Oral  Resp:  18 19 17   Height:  5\' 10"  (1.778 m)    Weight:  134 kg (295 lb 6.7 oz)  SpO2: 100% 98% 94% 93%    Wt Readings from Last 3 Encounters:  08/10/15 134 kg (295 lb 6.7 oz)  08/10/15 132.269 kg (291 lb 9.6 oz)  04/27/15 138.347 kg (305 lb)     Intake/Output Summary (Last 24 hours) at 08/11/15 1152 Last data filed at 08/11/15 0845  Gross per 24 hour  Intake 1259.7 ml  Output      1 ml  Net 1258.7 ml     Physical Exam  Awake Alert, Oriented X 3, No new F.N deficits, Normal affect [place].AT,PERRAL Supple Neck,No JVD, No cervical lymphadenopathy appriciated.  Symmetrical Chest wall movement, Good air movement bilaterally, CTAB RRR,No Gallops,Rubs or new Murmurs, No Parasternal Heave +ve B.Sounds, Abd Soft, No tenderness, No organomegaly appriciated, No rebound - guarding or rigidity. No Cyanosis, Clubbing or edema, No new Rash or bruise       Data Review:   Micro  Results Recent Results (from the past 240 hour(s))  MRSA PCR Screening     Status: None   Collection Time: 08/11/15  7:03 AM  Result Value Ref Range Status   MRSA by PCR NEGATIVE NEGATIVE Final    Comment:        The GeneXpert MRSA Assay (FDA approved for NASAL specimens only), is one component of a comprehensive MRSA colonization surveillance program. It is not intended to diagnose MRSA infection nor to guide or monitor treatment for MRSA infections.     Radiology Reports Dg Chest Port 1 View  08/11/2015  CLINICAL DATA:  Shortness of breath. EXAM: PORTABLE CHEST 1 VIEW COMPARISON:  01/16/2015 FINDINGS: Mild linear scarring in the right lower lobe. Lungs appear otherwise clear. Cardiac and mediastinal margins appear normal. No pleural effusion identified. IMPRESSION: 1.  No active cardiopulmonary disease is radiographically apparent. 2. Chronic scarring in the right lower lobe. Electronically Signed   By: Van Clines M.D.   On: 08/11/2015 08:03     CBC  Recent Labs Lab 08/10/15 1614 08/10/15 1721 08/11/15 0352  WBC 11.9* 12.5* 13.9*  HGB 13.9 14.0 12.1  HCT 42.4 42.4 36.4  PLT 377 335 297  MCV 83.5 84.5 83.1  MCH 27.4 27.9 27.6  MCHC 32.8 33.0 33.2  RDW 16.6* 16.6* 16.9*  LYMPHSABS  --   --  2.9  MONOABS  --   --  1.1*  EOSABS  --   --  0.1  BASOSABS  --   --  0.0    Chemistries   Recent Labs Lab 08/10/15 1721 08/11/15 0352  NA 132* 140  K 5.0 3.9  CL 100* 112*  CO2 12* 16*  GLUCOSE 677* 230*  BUN 9 6  CREATININE 1.24* 0.88  CALCIUM 9.7 8.9  AST  --  13*  ALT  --  16  ALKPHOS  --  92  BILITOT  --  0.9   ------------------------------------------------------------------------------------------------------------------ No results for input(s): CHOL, HDL, LDLCALC, TRIG, CHOLHDL, LDLDIRECT in the last 72 hours.  Lab Results  Component Value Date   HGBA1C 5.7* 09/08/2014    ------------------------------------------------------------------------------------------------------------------ No results for input(s): TSH, T4TOTAL, T3FREE, THYROIDAB in the last 72 hours.  Invalid input(s): FREET3 ------------------------------------------------------------------------------------------------------------------ No results for input(s): VITAMINB12, FOLATE, FERRITIN, TIBC, IRON, RETICCTPCT in the last 72 hours.  Coagulation profile No results for input(s): INR, PROTIME in the last 168 hours.  No results for input(s): DDIMER in the last 72 hours.  Cardiac Enzymes No results for input(s): CKMB, TROPONINI, MYOGLOBIN in the last 168 hours.  Invalid input(s): CK ------------------------------------------------------------------------------------------------------------------  Component Value Date/Time   BNP 192.8* 09/08/2014 1345    Time Spent in minutes   35   Brenna Friesenhahn K M.D on 08/11/2015 at 11:52 AM  Between 7am to 7pm - Pager - [phone]  After 7pm go to [email or web address] - password [facility]  [facility] -  Office  [phone]

## 2015-08-11 NOTE — Progress Notes (Signed)
Inpatient Diabetes Program Recommendations  AACE/ADA: New Consensus Statement on Inpatient Glycemic Control (2015)  Target Ranges:  Prepandial:   less than 140 mg/dL      Peak postprandial:   less than 180 mg/dL (1-2 hours)      Critically ill patients:  140 - 180 mg/dL   Review of Glycemic Control:  Results for SHELLSEA, BORUNDA (MRN [id number]) as of 08/11/2015 15:22  Ref. Range 08/11/2015 07:38 08/11/2015 08:40 08/11/2015 10:03 08/11/2015 11:20 08/11/2015 12:23  Glucose-Capillary Latest Ref Range: 65-99 mg/dL 186 (H) 143 (H) 186 (H) 181 (H) 135 (H)    Diabetes history: New onset Diabetes-A1C pending Outpatient Diabetes medications: None Current orders for Inpatient glycemic control:  Novolog sensitive tid with meals and HS, Lantus 25 units daily  Inpatient Diabetes Program Recommendations:    Note new onset diabetes.  Spoke with patient regarding new diagnosis.  She states that she is familiar with diabetes and insulin due to her son being diagnosed at age 48.  RN states she has already self-administered insulin today with vial and syringe.  Demonstrated use of insulin pen to patient including how to put on needle, 2 unit prime, and holding for 10 seconds. Patient verbalized understanding noting that her son uses insulin pens.  Discussed hyperglycemia, hypoglycemia, foot care, standards of care, monitoring, and follow-up.  Interestingly, patient is already seeing Dr. Buddy Duty for her thyroid.  She plans to call for follow-up appointment with him for her diabetes.  Discussed the use of Lantus insulin and that it covers basal needs.   She reports that currently her vision is blurred.  Discussed that this is likely from hyperglycemia and elevated blood sugars.  She does report recent weight gain related to megace which was started last year.  Also discussed dietary modifications with patient.  She reports recently trying to do better with her eating by eating a larger breakfast and a smaller dinner.  She would  benefit from further diabetes education as an outpatient.  At discharge, patient will need Rx. For Lantus solostar pen, insulin pen needles [phone]), and Blood glucose meter kit ([id number]).    Will follow.  Thanks, Adah Perl, RN, BC-ADM Inpatient Diabetes Coordinator Pager [phone] (8a-5p)

## 2015-08-11 NOTE — Progress Notes (Signed)
Report received from RN on [facility]. Pt to be transferred to [place] room 11.

## 2015-08-11 NOTE — Care Management Note (Signed)
Case Management Note  Patient Details  Name: Melinda [name] MRN: [id number] Date of Birth: 28-Apr-1966  Subjective/Objective:    Patient from home alone, pta indep here with DKA.  NCM will cont to follow for dc needs.                Action/Plan:   Expected Discharge Date:  08/12/15               Expected Discharge Plan:  Home/Self Care  In-House Referral:     Discharge planning Services  CM Consult  Post Acute Care Choice:    Choice offered to:     DME Arranged:    DME Agency:     HH Arranged:    HH Agency:     Status of Service:  In process, will continue to follow  Medicare Important Message Given:    Date Medicare IM Given:    Medicare IM give by:    Date Additional Medicare IM Given:    Additional Medicare Important Message give by:     If discussed at [place] of Stay Meetings, dates discussed:    Additional Comments:  Zenon Mayo, RN 08/11/2015, 4:52 PM

## 2015-08-11 NOTE — Progress Notes (Signed)
Nutrition Consult   RD consulted for nutrition education regarding new onset diabetes. Patient was too sleepy to listen to diet education. RD left "Carbohydrate Counting for People with Diabetes" handout from the Academy of Nutrition and Dietetics with patient for her review. Left RD contact information for any questions. Recommend outpatient diabetes diet education.  Body mass index is 42.39 kg/(m^2). Pt meets criteria for class 3, extreme/morbid obesity based on current BMI.  Current diet order is heart healthy CHO modified. Labs and medications reviewed. No further nutrition interventions warranted at this time. RD contact information provided. If additional nutrition issues arise, please re-consult RD.  Molli Barrows, RD, LDN, [place] Pager [phone] After Hours Pager [phone]

## 2015-08-11 NOTE — Progress Notes (Signed)
NURSING PROGRESS NOTE  Jilliana Shacklett [id number] Transfer Data: 08/11/2015 5:43 PM Attending Provider: Thurnell Lose, MD [id number], Lennox Laity, MD Code Status: Full   Melinda [name] is a 50 y.o. female patient transferred from [facility]  -No acute distress noted.  -No complaints of shortness of breath.  -No complaints of chest pain.   Cardiac Monitoring: Box # 09 in place. Cardiac monitor yields:sinus tachycardia.  Last Documented Vital Signs: Blood pressure 119/85, pulse 119, temperature 98.7 F (37.1 C), temperature source Oral, resp. rate 18, height 5\' 10"  (1.778 m), weight 134 kg (295 lb 6.7 oz), SpO2 96 %.  IV Fluids:  IV in place, occlusive dsg intact without redness, IV cath wrist left, condition patent and no redness, LR at 75   Allergies:  Ceftriaxone; Penicillins; Shellfish allergy; and Citrus  Past Medical History:   has a past medical history of Migraine; PTSD (post-traumatic stress disorder); Asthma; GERD (gastroesophageal reflux disease); Scoliosis; Arthritis; ADHD (attention deficit hyperactivity disorder); Peripheral neuropathy ([place]); Iron deficiency anemia due to chronic blood loss (09/09/2014); Hyperglycemia (09/09/2014); Low TSH level (09/08/2014); Menorrhagia (09/08/2014); Ovarian mass, right (09/09/2014); PE (pulmonary embolism); Lumbar herniated disc; and Multiple thyroid nodules.  Past Surgical History:   has past surgical history that includes Cholecystectomy.  Social History:   reports that she quit smoking about 9 months ago. Her smoking use included Cigarettes. She has a 20 pack-year smoking history. She has never used smokeless tobacco. She reports that she uses illicit drugs (Methamphetamines). She reports that she does not drink alcohol.  Skin: intact except where otherwise charted  Patient/Family orientated to room. Information packet given to patient/family. Admission inpatient armband information verified with patient/family to include name and date of birth and  placed on patient arm. Side rails up x 2, fall assessment and education completed with patient/family. Patient/family able to verbalize understanding of risk associated with falls and verbalized understanding to call for assistance before getting out of bed. Call light within reach. Patient/family able to voice and demonstrate understanding of unit orientation instructions.

## 2015-08-12 ENCOUNTER — Inpatient Hospital Stay (HOSPITAL_COMMUNITY): Payer: Medicaid Other

## 2015-08-12 DIAGNOSIS — R000 Tachycardia, unspecified: Secondary | ICD-10-CM

## 2015-08-12 DIAGNOSIS — D251 Intramural leiomyoma of uterus: Secondary | ICD-10-CM

## 2015-08-12 LAB — BASIC METABOLIC PANEL
Anion gap: 16 — ABNORMAL HIGH (ref 5–15)
CALCIUM: 8.8 mg/dL — AB (ref 8.9–10.3)
CHLORIDE: 106 mmol/L (ref 101–111)
CO2: 14 mmol/L — ABNORMAL LOW (ref 22–32)
CREATININE: 0.83 mg/dL (ref 0.44–1.00)
GFR calc Af Amer: >60 mL/min (ref >60)
Glucose, Bld: 268 mg/dL — ABNORMAL HIGH (ref 65–99)
Potassium: 3.7 mmol/L (ref 3.5–5.1)
SODIUM: 136 mmol/L (ref 135–145)

## 2015-08-12 LAB — CBC
HCT: 33.2 % — ABNORMAL LOW (ref 36.0–46.0)
HEMOGLOBIN: 10.8 g/dL — AB (ref 12.0–15.0)
MCH: 27 pg (ref 26.0–34.0)
MCHC: 32.5 g/dL (ref 30.0–36.0)
MCV: 83 fL (ref 78.0–100.0)
Platelets: 248 10*3/uL (ref 150–400)
RBC: 4 MIL/uL (ref 3.87–5.11)
RDW: 17.5 % — AB (ref 11.5–15.5)
WBC: 15.9 10*3/uL — ABNORMAL HIGH (ref 4.0–10.5)

## 2015-08-12 LAB — TSH: TSH: 0.432 u[IU]/mL (ref 0.350–4.500)

## 2015-08-12 LAB — GLUCOSE, CAPILLARY
GLUCOSE-CAPILLARY: 262 mg/dL — AB (ref 65–99)
GLUCOSE-CAPILLARY: 276 mg/dL — AB (ref 65–99)
GLUCOSE-CAPILLARY: 406 mg/dL — AB (ref 65–99)
Glucose-Capillary: 269 mg/dL — ABNORMAL HIGH (ref 65–99)

## 2015-08-12 LAB — MAGNESIUM: MAGNESIUM: 1.5 mg/dL — AB (ref 1.7–2.4)

## 2015-08-12 LAB — HEMOGLOBIN A1C
HEMOGLOBIN A1C: 11.7 % — AB (ref 4.8–5.6)
MEAN PLASMA GLUCOSE: 289 mg/dL

## 2015-08-12 MED ORDER — MEGESTROL ACETATE 40 MG PO TABS
80.0000 mg | ORAL_TABLET | Freq: Three times a day (TID) | ORAL | Status: DC
  Administered 2015-08-13 (×2): 80 mg via ORAL
  Filled 2015-08-12 (×3): qty 2

## 2015-08-12 MED ORDER — INSULIN ASPART 100 UNIT/ML ~~LOC~~ SOLN
3.0000 [IU] | Freq: Three times a day (TID) | SUBCUTANEOUS | Status: DC
  Administered 2015-08-12 – 2015-08-14 (×4): 3 [IU] via SUBCUTANEOUS

## 2015-08-12 MED ORDER — INSULIN GLARGINE 100 UNIT/ML ~~LOC~~ SOLN
25.0000 [IU] | Freq: Two times a day (BID) | SUBCUTANEOUS | Status: DC
  Administered 2015-08-12 – 2015-08-13 (×3): 25 [IU] via SUBCUTANEOUS
  Filled 2015-08-12 (×5): qty 0.25

## 2015-08-12 MED ORDER — INSULIN GLARGINE 100 UNIT/ML ~~LOC~~ SOLN: 35.0000 [IU] | Freq: Every day | SUBCUTANEOUS | Status: DC

## 2015-08-12 MED ORDER — IOHEXOL 350 MG/ML SOLN
100.0000 mL | Freq: Once | INTRAVENOUS | Status: AC | PRN
  Administered 2015-08-12: 100 mL via INTRAVENOUS

## 2015-08-12 MED ORDER — INSULIN GLARGINE 100 UNIT/ML ~~LOC~~ SOLN
10.0000 [IU] | Freq: Once | SUBCUTANEOUS | Status: AC
Start: 2015-08-12 — End: 2015-08-12
  Administered 2015-08-12: 10 [IU] via SUBCUTANEOUS
  Filled 2015-08-12: qty 0.1

## 2015-08-12 MED ORDER — INSULIN ASPART 100 UNIT/ML ~~LOC~~ SOLN
0.0000 [IU] | Freq: Three times a day (TID) | SUBCUTANEOUS | Status: DC
  Administered 2015-08-13 (×2): 5 [IU] via SUBCUTANEOUS
  Administered 2015-08-13: 3 [IU] via SUBCUTANEOUS
  Administered 2015-08-14: 5 [IU] via SUBCUTANEOUS
  Administered 2015-08-14: 11 [IU] via SUBCUTANEOUS

## 2015-08-12 MED ORDER — MAGNESIUM SULFATE 2 GM/50ML IV SOLN
2.0000 g | Freq: Once | INTRAVENOUS | Status: AC
  Administered 2015-08-12: 2 g via INTRAVENOUS
  Filled 2015-08-12: qty 50

## 2015-08-12 MED ORDER — INSULIN ASPART 100 UNIT/ML ~~LOC~~ SOLN
15.0000 [IU] | Freq: Once | SUBCUTANEOUS | Status: AC
  Administered 2015-08-12: 15 [IU] via SUBCUTANEOUS

## 2015-08-12 MED ORDER — MEGESTROL ACETATE 40 MG PO TABS
120.0000 mg | ORAL_TABLET | Freq: Two times a day (BID) | ORAL | Status: AC
  Administered 2015-08-12 (×2): 120 mg via ORAL
  Filled 2015-08-12 (×2): qty 3

## 2015-08-12 MED ORDER — IBUPROFEN 600 MG PO TABS
600.0000 mg | ORAL_TABLET | Freq: Three times a day (TID) | ORAL | Status: DC
  Administered 2015-08-12 (×3): 600 mg via ORAL
  Filled 2015-08-12 (×3): qty 1

## 2015-08-12 MED ORDER — INSULIN ASPART 100 UNIT/ML ~~LOC~~ SOLN
0.0000 [IU] | Freq: Every day | SUBCUTANEOUS | Status: DC
  Administered 2015-08-12 – 2015-08-13 (×2): 3 [IU] via SUBCUTANEOUS

## 2015-08-12 MED ORDER — SODIUM CHLORIDE 0.9 % IV SOLN
INTRAVENOUS | Status: DC
  Administered 2015-08-12: 09:00:00 via INTRAVENOUS

## 2015-08-12 NOTE — Progress Notes (Signed)
Patient Demographics:    Melinda [name], is a 50 y.o. female, DOB - 1966-01-20, [id number]  Admit date - 08/10/2015   Admitting Physician Reubin Milan, MD  Outpatient Primary MD for the patient is Minerva Ends, MD  LOS - 2   Chief Complaint  Patient presents with  . Vaginal Bleeding        Subjective:    [name] today has, No headache, No chest pain, No abdominal pain - No Nausea, No new weakness tingling or numbness, No Cough - SOB. Does have intermittent ongoing vaginal bleeding.   Assessment  & Plan :     1.DKA in a newly diagnosed DM2 - pending A1c, was treated with IV insulin drip, IV fluids and supportive care. Anion gap has closed. Has been transitioned to Lantus and sliding scale, Glucophage added, diabetic educator on board.  2. Persistent sinus tachycardia. Has history of PE and now off anticoagulation since January. We will check CT angiogram chest, TSH and echogram. Monitor.  3. Uterine Fibroids with ongoing dysfunctional uterine bleeding. On Megace, following with OB, she was due for hysterectomy in the outpatient setting. Continues to have bleeding inside the hospital, have requested Dr. Kennon Rounds covering OB to evaluate the patient. Monitor H&H.   4.Hx of PE - has come off of Eliquis since January, for now SCDs and ambulation for DVT prophylaxis, she had large uterine bleeding yesterday and PCP office therefore will avoid anticoagulant lesion for now. Encouraged to be ambulatory and out of bed throughout the hospital stay.  5. Morbid obesity. Outpatient follow-up with PCP.  6. Hx of Asthma - no wheezing or shortness of breath, supportive care.   7. Essential hypertension. Added Lopressor for better control.    Code Status : Full  Family Communication  : None  present  Disposition Plan  : Home in am  Consults  :  DM educator  Procedures  :    DVT Prophylaxis  :   SCDs    Lab Results  Component Value Date   PLT 297 08/11/2015    Inpatient Medications  Scheduled Meds: . ferrous sulfate  325 mg Oral BID WC  . gabapentin  100 mg Oral TID  . ibuprofen  600 mg Oral TID  . insulin aspart  0-5 Units Subcutaneous QHS  . insulin aspart  0-9 Units Subcutaneous TID WC  . insulin glargine  25 Units Subcutaneous Daily  . megestrol  40 mg Oral TID  . metoprolol tartrate  50 mg Oral BID   Continuous Infusions: . sodium chloride    . lactated ringers 75 mL/hr at 08/11/15 1707   PRN Meds:.albuterol, butalbital-acetaminophen-caffeine, diphenhydrAMINE  Antibiotics  :    Anti-infectives    None        Objective:   Filed Vitals:   08/11/15 1828 08/11/15 2141 08/12/15 0225 08/12/15 0553  BP: 137/63 155/70 152/68 153/73  Pulse: 103 108 100 106  Temp: 98.7 F (37.1 C) 98.5 F (36.9 C) 97.6 F (36.4 C) 98.2 F (36.8 C)  TempSrc: Oral Oral Oral Oral  Resp: 20 22 22 21   Height:      Weight:      SpO2: 100% 100% 100% 98%    Wt Readings from Last  3 Encounters:  08/11/15 136.76 kg (301 lb 8 oz)  08/10/15 132.269 kg (291 lb 9.6 oz)  04/27/15 138.347 kg (305 lb)    No intake or output data in the 24 hours ending 08/12/15 [id number]   Physical Exam  Awake Alert, Oriented X 3, No new F.N deficits, Normal affect [place].AT,PERRAL Supple Neck,No JVD, No cervical lymphadenopathy appriciated.  Symmetrical Chest wall movement, Good air movement bilaterally, CTAB RRR,No Gallops,Rubs or new Murmurs, No Parasternal Heave +ve B.Sounds, Abd Soft, No tenderness, No organomegaly appriciated, No rebound - guarding or rigidity. No Cyanosis, Clubbing or edema, No new Rash or bruise       Data Review:   Micro Results Recent Results (from the past 240 hour(s))  MRSA PCR Screening     Status: None   Collection Time: 08/11/15  7:03 AM  Result Value Ref  Range Status   MRSA by PCR NEGATIVE NEGATIVE Final    Comment:        The GeneXpert MRSA Assay (FDA approved for NASAL specimens only), is one component of a comprehensive MRSA colonization surveillance program. It is not intended to diagnose MRSA infection nor to guide or monitor treatment for MRSA infections.     Radiology Reports Dg Chest Port 1 View  08/11/2015  CLINICAL DATA:  Shortness of breath. EXAM: PORTABLE CHEST 1 VIEW COMPARISON:  01/16/2015 FINDINGS: Mild linear scarring in the right lower lobe. Lungs appear otherwise clear. Cardiac and mediastinal margins appear normal. No pleural effusion identified. IMPRESSION: 1.  No active cardiopulmonary disease is radiographically apparent. 2. Chronic scarring in the right lower lobe. Electronically Signed   By: Van Clines M.D.   On: 08/11/2015 08:03     CBC  Recent Labs Lab 08/10/15 1614 08/10/15 1721 08/11/15 0352  WBC 11.9* 12.5* 13.9*  HGB 13.9 14.0 12.1  HCT 42.4 42.4 36.4  PLT 377 335 297  MCV 83.5 84.5 83.1  MCH 27.4 27.9 27.6  MCHC 32.8 33.0 33.2  RDW 16.6* 16.6* 16.9*  LYMPHSABS  --   --  2.9  MONOABS  --   --  1.1*  EOSABS  --   --  0.1  BASOSABS  --   --  0.0    Chemistries   Recent Labs Lab 08/10/15 1721 08/11/15 0352 08/11/15 1124  NA 132* 140 140  K 5.0 3.9 3.9  CL 100* 112* 113*  CO2 12* 16* 16*  GLUCOSE 677* 230* 172*  BUN 9 6 7   CREATININE 1.24* 0.88 0.76  CALCIUM 9.7 8.9 8.7*  MG  --   --  1.5*  AST  --  13*  --   ALT  --  16  --   ALKPHOS  --  92  --   BILITOT  --  0.9  --    ------------------------------------------------------------------------------------------------------------------ No results for input(s): CHOL, HDL, LDLCALC, TRIG, CHOLHDL, LDLDIRECT in the last 72 hours.  Lab Results  Component Value Date   HGBA1C 11.7* 08/11/2015   ------------------------------------------------------------------------------------------------------------------ No results for  input(s): TSH, T4TOTAL, T3FREE, THYROIDAB in the last 72 hours.  Invalid input(s): FREET3 ------------------------------------------------------------------------------------------------------------------ No results for input(s): VITAMINB12, FOLATE, FERRITIN, TIBC, IRON, RETICCTPCT in the last 72 hours.  Coagulation profile No results for input(s): INR, PROTIME in the last 168 hours.  No results for input(s): DDIMER in the last 72 hours.  Cardiac Enzymes No results for input(s): CKMB, TROPONINI, MYOGLOBIN in the last 168 hours.  Invalid input(s): CK ------------------------------------------------------------------------------------------------------------------    Component Value Date/Time  BNP 192.8* 09/08/2014 1345    Time Spent in minutes   35   SINGH,PRASHANT K M.D on 08/12/2015 at 9:21 AM  Between 7am to 7pm - Pager - [phone]  After 7pm go to [email or web address] - password [facility]  [facility] -  Office  [phone]

## 2015-08-12 NOTE — Consult Note (Signed)
[facility]  Impression: Symptomatic fibroids Vaginal bleeding - uncontrollable with hormonal manipulation History of DVT/PE Morbid Obesity HTN New diagnosis of DM in the setting of resolving DKA  Recommendations: Due to her very being a very poor surgical candidate, I have discussed her with IR and we recommend embolization of fibroids to control her bleeding. I have discussed with the patient and she is agreeable to proceeding with this plan.  Reason for consult: Patient is a 50 y.o. [id number] female who was admitted with DKA after begin seen for bleeding. Patient was previously seen and evaluated by IR and decided to pursue hysterectomy. She has not been medically stable for surgery and initially was controlled on Megace. Following a thyroid biopsy, bleeding became worse.  Bleeding very heavy in office on 2/9 and sent to ED for possible transfusion.  Noted to be in DKA.  Bleeding abated with some high dose Megace. Not a candidate for IV Estrogen or continued high dose Megace due to medical history of PE. Quickly transitioned back to lower dose Megace and her bleeding has picked up again significantly.  Medically she is stable having resolved her AG acidosis.  We are asked to see the patient regarding bleeding.  Past Medical History  Diagnosis Date  . Migraine   . PTSD (post-traumatic stress disorder)   . Asthma   . GERD (gastroesophageal reflux disease)   . Scoliosis   . Arthritis   . ADHD (attention deficit hyperactivity disorder)   . Peripheral neuropathy ([place])   . Iron deficiency anemia due to chronic blood loss 09/09/2014  . Hyperglycemia 09/09/2014  . Low TSH level 09/08/2014  . Menorrhagia 09/08/2014  . Ovarian mass, right 09/09/2014  . PE (pulmonary embolism)   . Lumbar herniated disc   . Multiple thyroid nodules     Past Surgical History  Procedure Laterality Date  . Cholecystectomy      Family History  Problem Relation Age of  Onset  . Diabetes Mother   . Breast cancer Mother   . CAD Mother   . Hypertension Mother   . Alcohol abuse Father   . Hypertension Father     Social History   Social History  . Marital Status: Single    Spouse Name: N/A  . Number of Children: N/A  . Years of Education: N/A   Occupational History  . Not on file.   Social History Main Topics  . Smoking status: Former Smoker -- 0.50 packs/day for 40 years    Types: Cigarettes    Quit date: 11/03/2014  . Smokeless tobacco: Never Used  . Alcohol Use: No  . Drug Use: Yes    Special: Methamphetamines     Comment: 8/24/16per patient does not use methamphetamine  . Sexual Activity: No   Other Topics Concern  . Not on file   Social History Narrative    . ferrous sulfate  325 mg Oral BID WC  . gabapentin  100 mg Oral TID  . ibuprofen  600 mg Oral TID  . insulin aspart  0-5 Units Subcutaneous QHS  . insulin aspart  0-9 Units Subcutaneous TID WC  . insulin aspart  3 Units Subcutaneous TID WC  . insulin glargine  10 Units Subcutaneous Once  . [START ON 08/13/2015] insulin glargine  35 Units Subcutaneous Daily  . magnesium sulfate 1 - 4 g bolus IVPB  2 g Intravenous Once  . megestrol  120 mg Oral BID  . [START ON 08/13/2015]  megestrol  80 mg Oral TID  . metoprolol tartrate  50 mg Oral BID    Allergies  Allergen Reactions  . Ceftriaxone Anaphylaxis  . Penicillins Shortness Of Breath    Has patient had a PCN reaction causing immediate rash, facial/tongue/throat swelling, SOB or lightheadedness with hypotension: Yes Has patient had a PCN reaction causing severe rash involving mucus membranes or skin necrosis: No Has patient had a PCN reaction that required hospitalization No Has patient had a PCN reaction occurring within the last 10 years: No If all of the above answers are "NO", then may proceed with Cephalosporin use.   . Shellfish Allergy Anaphylaxis  . [place]rus Rash    Review of Systems - General ROS: positive for  -  heavy vaginal bleeding  Exam Filed Vitals:   08/12/15 0553 08/12/15 1004  BP: 153/73 154/74  Pulse: 106 115  Temp: 98.2 F (36.8 C) 97.7 F (36.5 C)  Resp: 21 21    Labs: CBC    Component Value Date/Time   WBC 15.9* 08/12/2015 0935   RBC 4.00 08/12/2015 0935   RBC 4.00 09/07/2014 2324   HGB 10.8* 08/12/2015 0935   HCT 33.2* 08/12/2015 0935   PLT 248 08/12/2015 0935   MCV 83.0 08/12/2015 0935   MCH 27.0 08/12/2015 0935   MCHC 32.5 08/12/2015 0935   RDW 17.5* 08/12/2015 0935   LYMPHSABS 2.9 08/11/2015 0352   MONOABS 1.1* 08/11/2015 0352   EOSABS 0.1 08/11/2015 0352   BASOSABS 0.0 08/11/2015 0352    CMP     Component Value Date/Time   NA 136 08/12/2015 0757   K 3.7 08/12/2015 0757   CL 106 08/12/2015 0757   CO2 14* 08/12/2015 0757   GLUCOSE 268* 08/12/2015 0757   BUN <5* 08/12/2015 0757   CREATININE 0.83 08/12/2015 0757   CALCIUM 8.8* 08/12/2015 0757   PROT 6.0* 08/11/2015 0352   ALBUMIN 3.2* 08/11/2015 0352   AST 13* 08/11/2015 0352   ALT 16 08/11/2015 0352   ALKPHOS 92 08/11/2015 0352   BILITOT 0.9 08/11/2015 0352   GFRNONAA >60 08/12/2015 0757   GFRAA >60 08/12/2015 0757    Lab Results  Component Value Date   TSH 1.227 04/13/2015    No components found for: URINALYSIS  Lab Results  Component Value Date   PREGTESTUR NEGATIVE 01/16/2015    Radiological Studies Dg Chest Port 1 View  08/11/2015  CLINICAL DATA:  Shortness of breath. EXAM: PORTABLE CHEST 1 VIEW COMPARISON:  01/16/2015 FINDINGS: Mild linear scarring in the right lower lobe. Lungs appear otherwise clear. Cardiac and mediastinal margins appear normal. No pleural effusion identified. IMPRESSION: 1.  No active cardiopulmonary disease is radiographically apparent. 2. Chronic scarring in the right lower lobe. Electronically Signed   By: Van Clines M.D.   On: 08/11/2015 08:03   Pelvic sonogram Uterus  Measurements: 15.6 x 9.0 x 12.3 cm. An intramural fibroid in the posterior, mid  to lower uterine segment measures 6.3 x 6.4 x 5.8 cm. A larger fibroid in the mid uterus measures 6.6 x 6.9 x 6.8 cm, and it is unclear whether this reflects an intramural fibroid distorting the endometrium or potentially a submucosal fibroid.  Endometrium Not well evaluated due to distortion by the fibroids.  Right ovary Measurements: 4.5 x 2.4 x 2.5 cm. A 2.5 x 1.7 x 1.5 cm lesion demonstrates central hypo echogenicity with surrounding hyperechogenicity/thick wall with mild-to-moderate peripheral blood flow.  Left ovary Not visualized.  Trace pelvic free fluid.  IMPRESSION:  1. Two large fibroids. The endometrium is distorted by the fibroids, and it is unclear whether 1 of the fibroids is intramural or submucosal. 2. 2.5 cm thick-walled lesion in the right ovary as above. This may represent a corpus luteum, but a follow-up ultrasound is recommended in 6 weeks to assess for resolution. 3. Nonvisualization of the left ovary.  MRI FINDINGS: Uterus is enlarged, measuring 12.1 x 10.5 x 13.5 cm.  Multiple uterine fibroids, including:  --8.7 x 8.0 x 7.8 cm intramural fibroid with dominant mucosal component in the left uterine fundus (series 4/ image 16)  --5.8 x 6.7 x 6.4 cm intramural fibroid with small submucosal component in the right lower uterine body (series 4/image 8)  --Additional submucosal fibroids measuring up to 2.9 cm, proximally 5-6 in number (series 4/images 12-14)  All fibroids uniformly enhance following contrast administration.  Endometrial complex is distorted/ displaced by multiple uterine fibroids.  Right ovary measures 1.4 x 2.5 x 2.5 cm (series 6/image 15) and is within normal limits.  Left ovary measures 2.9 x 2.3 x 2.3 cm (series 6/image 8) and is notable for a dominant corpus luteal cyst.  No pelvic ascites.  No suspicious pelvic lymphadenopathy.  Degenerative changes with posterior disc extrusion at L5-S1 (series 4/image  12).  IMPRESSION: Multiple uterine fibroids, including a dominant 8.7 cm intramural fibroid with mucosal component in the left uterine fundus, as above. All fibroids enhance following contrast administration.   Thank you so much for allowing [place] to participate in the care of this patient.  We will continue to follow with you. Please call the attending OB/GYN physician with questions or concerns at [phone].

## 2015-08-12 NOTE — H&P (Signed)
Chief Complaint: For symptomatic uterine fibroids  Referring Physician(s): Kennon Rounds  History of Present Illness: Melinda [name] is a 50 y.o. female with symptomatic uterine fibroids who was seen in consultation with Dr. Anselm Pancoast back on December 01, 2014.  At that time, the menstrual bleeding had increased in the past 6 months and she required a blood transfusion in March 2016.   She had an endometrial biopsy on 11/01/2014 which was negative for hyperplasia or malignancy. Benign proliferative endometrium identified.   She had also presented to the emergency department on 11/08/2014 with acute chest pain. She was found to have an acute pulmonary embolism and right lower extremity DVT. Patient was hospitalized and received IV heparin and subsequently been switched to oral anticoagulation.   Previously the patient had a menstrual period every 30 days and lasts approximately 10 days. 2 days with large blood clots and heavy bleeding can require a new pad every hour. She was basically homebound during these days of heavy bleeding. She also had some history of interperiod bleeding.   Her bleeding has caused her to be anemia with a hemoglobin as low as 8.5.  Now she is admitted to [facility] hospital DKA.  She tells me she had never been diagnosed with diabetes before.  She also reports that she is no longer taking anticoagulation due to her heavy bleeding from her fibroids.  She states the bleeding has gotten so bad that it really never stops. She states she has some bleeding all month long.  She states she CAN tell when her regular cycle is on because of the cramping and the bleeding is quite a bit heavier.  She had discussed these issues with her OB/GYN as well and had hoped to have a hysterectomy.  They feel she is too high risk at this time given her morbid obesity and newly diagnosed DM.  We are asked to re-evaluate her for uterine artery embolization.  She denies any other recent illness, fever, or  chills.   Past Medical History  Diagnosis Date  . Migraine   . PTSD (post-traumatic stress disorder)   . Asthma   . GERD (gastroesophageal reflux disease)   . Scoliosis   . Arthritis   . ADHD (attention deficit hyperactivity disorder)   . Peripheral neuropathy ([place])   . Iron deficiency anemia due to chronic blood loss 09/09/2014  . Hyperglycemia 09/09/2014  . Low TSH level 09/08/2014  . Menorrhagia 09/08/2014  . Ovarian mass, right 09/09/2014  . PE (pulmonary embolism)   . Lumbar herniated disc   . Multiple thyroid nodules     Past Surgical History  Procedure Laterality Date  . Cholecystectomy      Allergies: Ceftriaxone; Penicillins; Shellfish allergy; and Citrus  Medications: Prior to Admission medications   Medication Sig Start Date End Date Taking? Authorizing Provider  acetaminophen (TYLENOL) 500 MG tablet Take 500-1,000 mg by mouth every 6 (six) hours as needed for moderate pain (patient takes 1-2 depending on pain level).    Yes Historical Provider, MD  albuterol (PROVENTIL HFA;VENTOLIN HFA) 108 (90 BASE) MCG/ACT inhaler Inhale 2 puffs into the lungs every 6 (six) hours as needed for wheezing or shortness of breath. 04/13/15  Yes Josalyn Funches, MD  diphenhydrAMINE (BENADRYL) 25 MG tablet Take 50 mg by mouth at bedtime as needed for sleep (sleep).   Yes Historical Provider, MD  ferrous sulfate 325 (65 FE) MG tablet TAKE ONE (1) TABLET BY MOUTH TWO (2) TIMES DAILY WITH A MEAL 07/18/15  Yes Josalyn Funches, MD  gabapentin (NEURONTIN) 100 MG capsule TAKE 1 CAPSULE BY MOUTH THREE TIMES A DAY 07/18/15  Yes Josalyn Funches, MD  megestrol (MEGACE) 40 MG tablet TAKE ONE (1) TABLET BY MOUTH 3 TIMES DAILY 07/17/15  Yes Peggy Constant, MD  Multiple Vitamins-Minerals (MULTIVITAMIN & MINERAL PO) Take 1 tablet by mouth daily.   Yes Historical Provider, MD  SUMAtriptan (IMITREX) 50 MG tablet Take 1 tablet (50 mg total) by mouth every 2 (two) hours as needed for migraine or headache. May  repeat in 2 hours if headache persists or recurs. Do not exceed two doses in 7 day period. 03/15/15  Yes Josalyn Funches, MD  butalbital-acetaminophen-caffeine (FIORICET) 50-325-40 MG tablet Take 1-2 tablets by mouth every 8 (eight) hours as needed for headache. Patient not taking: Reported on 08/10/2015 04/13/15 04/12/16  Boykin Nearing, MD  glucose monitoring kit (FREESTYLE) monitoring kit 1 each by Does not apply route 4 (four) times daily - after meals and at bedtime. 1 month Diabetic Testing Supplies for QAC-QHS accuchecks.Any brand OK. Diagnosis E11.65 08/11/15   Thurnell Lose, MD  insulin aspart (NOVOLOG) 100 UNIT/ML injection Before each meal 3 times a day, 140-199 - 2 units, 200-250 - 4 units, 251-299 - 6 units,  300-349 - 8 units,  350 or above 10 units. Dispense syringes and needles as needed, Ok to switch to PEN if approved. Substitute to any brand approved. DX DM2, Code E11.65 08/11/15   Thurnell Lose, MD  insulin glargine (LANTUS) 100 UNIT/ML injection Inject 0.25 mLs (25 Units total) into the skin daily. Dispense insulin pen if approved, if not dispense as needed syringes and needles for 1 month supply. Can switch to Levemir. Diagnosis E 11.65. 08/11/15   Thurnell Lose, MD  Insulin Syringe-Needle U-100 25G X 1" 1 ML MISC For 4 times a day insulin SQ, 1 month supply. Diagnosis E11.65 08/11/15   Thurnell Lose, MD  Lancets (FREESTYLE) lancets For glucose testing every before meals at bedtime. Diagnosis E 11.65.   Can substitute to any accepted brand 08/11/15   Thurnell Lose, MD  metFORMIN (GLUCOPHAGE) 850 MG tablet Take 1 tablet (850 mg total) by mouth 2 (two) times daily with a meal. 08/11/15   Thurnell Lose, MD     Family History  Problem Relation Age of Onset  . Diabetes Mother   . Breast cancer Mother   . CAD Mother   . Hypertension Mother   . Alcohol abuse Father   . Hypertension Father     Social History   Social History  . Marital Status: Single    Spouse Name:  N/A  . Number of Children: N/A  . Years of Education: N/A   Social History Main Topics  . Smoking status: Former Smoker -- 0.50 packs/day for 40 years    Types: Cigarettes    Quit date: 11/03/2014  . Smokeless tobacco: Never Used  . Alcohol Use: No  . Drug Use: Yes    Special: Methamphetamines     Comment: 8/24/16per patient does not use methamphetamine  . Sexual Activity: No   Other Topics Concern  . None   Social History Narrative    Review of Systems: A 12 point ROS discussed and pertinent positives are indicated in the HPI above.  All other systems are negative.  Review of Systems  Constitutional: Positive for activity change and fatigue. Negative for fever, chills and appetite change.  HENT: Negative.   Eyes: Positive for visual  disturbance.  Respiratory: Negative for cough, chest tightness and shortness of breath.   Cardiovascular: Negative for chest pain.  Gastrointestinal: Positive for abdominal pain. Negative for nausea, vomiting and abdominal distention.  Endocrine: Positive for polydipsia and polyuria.  Genitourinary: Positive for menstrual problem and pelvic pain.  Musculoskeletal: Negative.   Skin: Negative.   Neurological: Positive for dizziness.  Psychiatric/Behavioral: Negative.     Vital Signs: BP 154/74 mmHg  Pulse 115  Temp(Src) 97.7 F (36.5 C) (Oral)  Resp 21  Ht _0  (1.778 m)  Wt 301 lb 8 oz (136.76 kg)  BMI 43.26 kg/m2  SpO2 100%  Physical Exam  Constitutional: She is oriented to person, place, and time.  Morbidly obese. NAD  HENT:  Head: Normocephalic and atraumatic.  Eyes: EOM are normal.  Neck: Normal range of motion. Neck supple.  Cardiovascular: Regular rhythm and normal heart sounds.   No murmur heard. Palpable femoral pulses +Mild tachycardia 100-105  Pulmonary/Chest: Effort normal and breath sounds normal. No respiratory distress. She has no wheezes.  Abdominal: There is tenderness.  Obese, Soft. Tender at suprapubic  area.  Musculoskeletal: Normal range of motion.  Neurological: She is alert and oriented to person, place, and time.  Skin: Skin is warm and dry.  Psychiatric: She has a normal mood and affect. Her behavior is normal. Judgment and thought content normal.  Nursing note reviewed.   Mallampati Score:  MD Evaluation Airway: WNL Heart: WNL Abdomen: WNL Chest/ Lungs: WNL ASA  Classification: 2 Mallampati/Airway Score: Two *Mild tachycardia = 100-105  Imaging: Dg Chest Port 1 View  08/11/2015  CLINICAL DATA:  Shortness of breath. EXAM: PORTABLE CHEST 1 VIEW COMPARISON:  01/16/2015 FINDINGS: Mild linear scarring in the right lower lobe. Lungs appear otherwise clear. Cardiac and mediastinal margins appear normal. No pleural effusion identified. IMPRESSION: 1.  No active cardiopulmonary disease is radiographically apparent. 2. Chronic scarring in the right lower lobe. Electronically Signed   By: Van Clines M.D.   On: 08/11/2015 08:03    Labs:  CBC:  Recent Labs  08/10/15 1614 08/10/15 1721 08/11/15 0352 08/12/15 0935  WBC 11.9* 12.5* 13.9* 15.9*  HGB 13.9 14.0 12.1 10.8*  HCT 42.4 42.4 36.4 33.2*  PLT 377 335 297 248    COAGS: No results for input(s): INR, APTT in the last 8760 hours.  BMP:  Recent Labs  08/10/15 1721 08/11/15 0352 08/11/15 1124 08/12/15 0757  NA 132* 140 140 136  K 5.0 3.9 3.9 3.7  CL 100* 112* 113* 106  CO2 12* 16* 16* 14*  GLUCOSE 677* 230* 172* 268*  BUN _1 <5*  CALCIUM 9.7 8.9 8.7* 8.8*  CREATININE 1.24* 0.88 0.76 0.83  GFRNONAA 51* >60 >60 >60  GFRAA 59* >60 >60 >60    LIVER FUNCTION TESTS:  Recent Labs  09/07/14 2025 09/08/14 0147 11/09/14 0310 08/11/15 0352  BILITOT 0.4 0.5 1.0 0.9  AST 12 14 57* 13*  ALT 10 11 11* 16  ALKPHOS 73 73 75 92  PROT 6.9 7.2 6.1* 6.0*  ALBUMIN 3.7 4.0 3.1* 3.2*    TUMOR MARKERS: No results for input(s): AFPTM, CEA, CA199, CHROMGRNA in the last 8760 hours.  Assessment and  Plan:  Newly diagnosed diabetes = Blood sugar currently in control  Symptomatic Uterine Fibroids  Will proceed with Uterine Artery Embolization tomorrow by Dr. Pascal Lux.  Dr. Pascal Lux and I both saw the patient today.  We discussed the uterine artery embolization procedure in depth. Explained the procedure  and the post procedure care which includes at least one night in the hospital. We also discussed post embolization syndrome which can last at 1-2 weeks following the procedure. The patient would like to proceed with uterine artery embolization procedure.  Consent is signed and on the chart.  Thank you for this interesting consult.  I greatly enjoyed meeting Melinda [name] and look forward to participating in their care.  A copy of this report was sent to the requesting provider on this date.  Electronically Signed: Murrell Redden PA-C 08/12/2015, 12:58 PM   I spent a total of 40 Minutes  in face to face in clinical consultation, greater than 50% of which was counseling/coordinating care for uterine artery embolization.

## 2015-08-13 ENCOUNTER — Inpatient Hospital Stay (HOSPITAL_COMMUNITY): Payer: Medicaid Other

## 2015-08-13 HISTORY — PX: OTHER SURGICAL HISTORY: SHX169

## 2015-08-13 HISTORY — PX: UTERINE ARTERY EMBOLIZATION: SHX2629

## 2015-08-13 LAB — GLUCOSE, CAPILLARY
Glucose-Capillary: 204 mg/dL — ABNORMAL HIGH (ref 65–99)
Glucose-Capillary: 182 mg/dL — ABNORMAL HIGH (ref 65–99)
Glucose-Capillary: 213 mg/dL — ABNORMAL HIGH (ref 65–99)
Glucose-Capillary: 258 mg/dL — ABNORMAL HIGH (ref 65–99)

## 2015-08-13 LAB — MAGNESIUM
Magnesium: 1.7 mg/dL (ref 1.7–2.4)
Magnesium: 1.6 mg/dL — ABNORMAL LOW (ref 1.7–2.4)

## 2015-08-13 LAB — PROTIME-INR
INR: 1.27 (ref 0.00–1.49)
Prothrombin Time: 16 seconds — ABNORMAL HIGH (ref 11.6–15.2)

## 2015-08-13 LAB — MRSA PCR SCREENING: MRSA BY PCR: NEGATIVE

## 2015-08-13 LAB — HEMOGLOBIN AND HEMATOCRIT, BLOOD
HEMATOCRIT: 30.9 % — AB (ref 36.0–46.0)
HEMOGLOBIN: 10 g/dL — AB (ref 12.0–15.0)

## 2015-08-13 MED ORDER — FENTANYL CITRATE (PF) 100 MCG/2ML IJ SOLN
INTRAMUSCULAR | Status: AC
  Filled 2015-08-13: qty 2

## 2015-08-13 MED ORDER — DIPHENHYDRAMINE HCL 50 MG/ML IJ SOLN: 25.0000 mg | Freq: Four times a day (QID) | INTRAMUSCULAR | Status: DC | PRN

## 2015-08-13 MED ORDER — SODIUM CHLORIDE 0.9% FLUSH: 3.0000 mL | INTRAVENOUS | Status: DC | PRN

## 2015-08-13 MED ORDER — MIDAZOLAM HCL 2 MG/2ML IJ SOLN
INTRAMUSCULAR | Status: AC | PRN
  Administered 2015-08-13: 1 mg via INTRAVENOUS
  Administered 2015-08-13: 0.5 mg via INTRAVENOUS
  Administered 2015-08-13 (×2): 1 mg via INTRAVENOUS
  Administered 2015-08-13: 0.5 mg via INTRAVENOUS
  Administered 2015-08-13 (×2): 1 mg via INTRAVENOUS

## 2015-08-13 MED ORDER — DOCUSATE SODIUM 100 MG PO CAPS
100.0000 mg | ORAL_CAPSULE | Freq: Two times a day (BID) | ORAL | Status: DC
  Administered 2015-08-13 – 2015-08-14 (×2): 100 mg via ORAL
  Filled 2015-08-13 (×2): qty 1

## 2015-08-13 MED ORDER — KETOROLAC TROMETHAMINE 30 MG/ML IJ SOLN
INTRAMUSCULAR | Status: AC
  Administered 2015-08-13: 30 mg via INTRAVENOUS
  Filled 2015-08-13: qty 1

## 2015-08-13 MED ORDER — ONDANSETRON HCL 4 MG/2ML IJ SOLN
4.0000 mg | Freq: Four times a day (QID) | INTRAMUSCULAR | Status: DC | PRN
Start: 2015-08-13 — End: 2015-08-14

## 2015-08-13 MED ORDER — KETOROLAC TROMETHAMINE 30 MG/ML IJ SOLN
30.0000 mg | Freq: Once | INTRAMUSCULAR | Status: AC
  Administered 2015-08-13: 30 mg via INTRAVENOUS

## 2015-08-13 MED ORDER — NALOXONE HCL 0.4 MG/ML IJ SOLN: 0.4000 mg | INTRAMUSCULAR | Status: DC | PRN

## 2015-08-13 MED ORDER — ONDANSETRON HCL 4 MG/2ML IJ SOLN
INTRAMUSCULAR | Status: AC
  Filled 2015-08-13: qty 2

## 2015-08-13 MED ORDER — ONDANSETRON HCL 4 MG/2ML IJ SOLN: 4.0000 mg | Freq: Four times a day (QID) | INTRAMUSCULAR | Status: DC | PRN

## 2015-08-13 MED ORDER — LACTATED RINGERS IV SOLN: INTRAVENOUS | Status: AC

## 2015-08-13 MED ORDER — SODIUM CHLORIDE 0.9% FLUSH: 9.0000 mL | INTRAVENOUS | Status: DC | PRN

## 2015-08-13 MED ORDER — KETOROLAC TROMETHAMINE 30 MG/ML IJ SOLN
30.0000 mg | Freq: Four times a day (QID) | INTRAMUSCULAR | Status: DC
  Administered 2015-08-13 – 2015-08-14 (×2): 30 mg via INTRAVENOUS
  Filled 2015-08-13 (×2): qty 1

## 2015-08-13 MED ORDER — CEFAZOLIN SODIUM-DEXTROSE 2-3 GM-% IV SOLR
INTRAVENOUS | Status: AC
  Filled 2015-08-13: qty 50

## 2015-08-13 MED ORDER — LIDOCAINE HCL 1 % IJ SOLN
INTRAMUSCULAR | Status: AC
  Filled 2015-08-13: qty 20

## 2015-08-13 MED ORDER — MIDAZOLAM HCL 2 MG/2ML IJ SOLN
INTRAMUSCULAR | Status: AC
Start: 2015-08-13 — End: 2015-08-13
  Filled 2015-08-13: qty 2

## 2015-08-13 MED ORDER — ONDANSETRON HCL 4 MG/2ML IJ SOLN
4.0000 mg | Freq: Once | INTRAMUSCULAR | Status: AC
  Administered 2015-08-13: 4 mg via INTRAVENOUS

## 2015-08-13 MED ORDER — FENTANYL CITRATE (PF) 100 MCG/2ML IJ SOLN
INTRAMUSCULAR | Status: AC | PRN
  Administered 2015-08-13 (×6): 50 ug via INTRAVENOUS

## 2015-08-13 MED ORDER — IOHEXOL 300 MG/ML  SOLN
300.0000 mL | Freq: Once | INTRAMUSCULAR | Status: AC | PRN
  Administered 2015-08-13: 215 mL via INTRAVENOUS

## 2015-08-13 MED ORDER — PROMETHAZINE HCL 25 MG PO TABS: 25.0000 mg | ORAL_TABLET | Freq: Three times a day (TID) | ORAL | Status: DC | PRN

## 2015-08-13 MED ORDER — VANCOMYCIN HCL 10 G IV SOLR
1500.0000 mg | INTRAVENOUS | Status: AC
  Administered 2015-08-13: 1500 mg via INTRAVENOUS
  Filled 2015-08-13: qty 1500

## 2015-08-13 MED ORDER — HYDROMORPHONE 1 MG/ML IV SOLN
INTRAVENOUS | Status: DC
Start: 2015-08-13 — End: 2015-08-14
  Administered 2015-08-13: 5 mg via INTRAVENOUS
  Administered 2015-08-13: 2.9 mg via INTRAVENOUS
  Administered 2015-08-14: 4.2 mg via INTRAVENOUS
  Administered 2015-08-14: 10 mg via INTRAVENOUS
  Filled 2015-08-13: qty 25

## 2015-08-13 MED ORDER — SODIUM CHLORIDE 0.9% FLUSH
3.0000 mL | Freq: Two times a day (BID) | INTRAVENOUS | Status: DC
Start: 2015-08-13 — End: 2015-08-14

## 2015-08-13 MED ORDER — PROMETHAZINE HCL 25 MG RE SUPP
25.0000 mg | Freq: Three times a day (TID) | RECTAL | Status: DC | PRN
  Filled 2015-08-13: qty 1

## 2015-08-13 MED ORDER — SODIUM CHLORIDE 0.9 % IV SOLN: 250.0000 mL | INTRAVENOUS | Status: DC | PRN

## 2015-08-13 MED ORDER — MIDAZOLAM HCL 2 MG/2ML IJ SOLN
INTRAMUSCULAR | Status: AC
  Filled 2015-08-13: qty 2

## 2015-08-13 NOTE — Sedation Documentation (Signed)
Patient denies pain and is resting comfortably.  

## 2015-08-13 NOTE — Progress Notes (Signed)
Patient Demographics:    Melinda [name], is a 50 y.o. female, DOB - [birth year], [id number]  Admit date - 08/10/2015   Admitting Physician Reubin Milan, MD  Outpatient Primary MD for the patient is Minerva Ends, MD  LOS - 3   Chief Complaint  Patient presents with  . Vaginal Bleeding        Subjective:    Melinda [name] today has, No headache, No chest pain, No abdominal pain - No Nausea, No new weakness tingling or numbness, No Cough - SOB. Does have intermittent ongoing vaginal bleeding.   Assessment  & Plan :     1.DKA in a newly diagnosed DM2 - pending A1c, was treated with IV insulin drip, IV fluids and supportive care. Anion gap has closed. Has been transitioned to Lantus and sliding scale, Glucophage added, diabetic educator on board.  2. Persistent sinus tachycardia. Has history of PE and now off anticoagulation since January. -ve CT angiogram chest, stable TSH , pending echogram. Monitor.  3. Uterine Fibroids with ongoing dysfunctional uterine bleeding. On Megace, following with OB, she was due for hysterectomy in the outpatient setting. Continues to have bleeding inside the hospital, have requested Dr. Kennon Rounds covering OB to evaluate the patient. Per OB IR to embolize on 08/13/15, Monitor H&H.   4.Hx of PE - has come off of Eliquis since January, for now SCDs and ambulation for DVT prophylaxis, she had large uterine bleeding yesterday and PCP office therefore will avoid anticoagulant lesion for now. Encouraged to be ambulatory and out of bed throughout the hospital stay.  5. Morbid obesity. Outpatient follow-up with PCP.  6. Hx of Asthma - no wheezing or shortness of breath, supportive care.   7. Essential hypertension. Added Lopressor for better control.    Code Status :  Full  Family Communication  : None present  Disposition Plan  : Home in am  Consults  :  DM educator  Procedures  :    DVT Prophylaxis  :   SCDs    Lab Results  Component Value Date   PLT 248 08/12/2015    Inpatient Medications  Scheduled Meds: . fentaNYL      . ferrous sulfate  325 mg Oral BID WC  . gabapentin  100 mg Oral TID  . insulin aspart  0-15 Units Subcutaneous TID WC  . insulin aspart  0-5 Units Subcutaneous QHS  . insulin aspart  3 Units Subcutaneous TID WC  . insulin glargine  25 Units Subcutaneous BID  . lidocaine      . megestrol  80 mg Oral TID  . metoprolol tartrate  50 mg Oral BID  . midazolam       Continuous Infusions: . lactated ringers     PRN Meds:.albuterol, butalbital-acetaminophen-caffeine, diphenhydrAMINE  Antibiotics  :    Anti-infectives    None        Objective:   Filed Vitals:   08/12/15 2103 08/13/15 0130 08/13/15 0502 08/13/15 0926  BP: 126/66 126/58 119/65 150/90  Pulse:  100 98 100  Temp: 98.8 F (37.1 C) 98.4 F (36.9 C) 98.3 F (36.8 C)   TempSrc: Oral Oral Oral   Resp:  18 18 16   Height:  Weight:      SpO2:  100% 99% 100%    Wt Readings from Last 3 Encounters:  08/11/15 136.76 kg (301 lb 8 oz)  08/10/15 132.269 kg (291 lb 9.6 oz)  04/27/15 138.347 kg (305 lb)     Intake/Output Summary (Last 24 hours) at 08/13/15 0949 Last data filed at 08/13/15 0648  Gross per 24 hour  Intake    320 ml  Output      0 ml  Net    320 ml     Physical Exam  Awake Alert, Oriented X 3, No new F.N deficits, Normal affect [place].AT,PERRAL Supple Neck,No JVD, No cervical lymphadenopathy appriciated.  Symmetrical Chest wall movement, Good air movement bilaterally, CTAB RRR,No Gallops,Rubs or new Murmurs, No Parasternal Heave +ve B.Sounds, Abd Soft, No tenderness, No organomegaly appriciated, No rebound - guarding or rigidity. No Cyanosis, Clubbing or edema, No new Rash or bruise       Data Review:   Micro  Results Recent Results (from the past 240 hour(s))  MRSA PCR Screening     Status: None   Collection Time: 08/11/15  7:03 AM  Result Value Ref Range Status   MRSA by PCR NEGATIVE NEGATIVE Final    Comment:        The GeneXpert MRSA Assay (FDA approved for NASAL specimens only), is one component of a comprehensive MRSA colonization surveillance program. It is not intended to diagnose MRSA infection nor to guide or monitor treatment for MRSA infections.   MRSA PCR Screening     Status: None   Collection Time: 08/13/15  5:13 AM  Result Value Ref Range Status   MRSA by PCR NEGATIVE NEGATIVE Final    Comment:        The GeneXpert MRSA Assay (FDA approved for NASAL specimens only), is one component of a comprehensive MRSA colonization surveillance program. It is not intended to diagnose MRSA infection nor to guide or monitor treatment for MRSA infections.     Radiology Reports Ct Angio Chest Pe W/cm &/or Wo Cm  08/12/2015  CLINICAL DATA:  Shortness of breath for 2 weeks. Admitted with severe vaginal bleeding. EXAM: CT ANGIOGRAPHY CHEST WITH CONTRAST TECHNIQUE: Multidetector CT imaging of the chest was performed using the standard protocol during bolus administration of intravenous contrast. Multiplanar CT image reconstructions and MIPs were obtained to evaluate the vascular anatomy. CONTRAST:  143mL OMNIPAQUE IOHEXOL 350 MG/ML SOLN COMPARISON:  Chest x-ray on 08/10/2005 FINDINGS: The central pulmonary arteries are well opacified. Peripheral branch artery opacification is somewhat limited due to timing of contrast bolus and patient body habitus. There is no evidence of pulmonary embolism. The thoracic aorta is normal in caliber. The heart size is normal. No pleural or pericardial fluid identified. Lungs show no evidence of edema, infiltrate or nodule. Mild scarring/ atelectasis present at both lung bases. Focal followup of the anterior right upper lobe without pneumothorax. No  lymphadenopathy identified. In the upper abdomen, there is an incidental mass just superior to the right kidney and likely originating from the right adrenal gland. This lesion measures approximately 5.3 x 4.8 x 4.4 cm and contains areas of macroscopic fat and overall is of low density (-15 to 4 HU) by Hounsfield measurements. There are some partial calcifications anteriorly within the lesion. 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 Review of the MIP images confirms the above findings. IMPRESSION: 1. No evidence of pulmonary embolism or other acute findings in the chest. 2. Incidental 5 cm  mass of the right adrenal gland with low density, macroscopic internal fat and areas of anterior calcification. The focal areas of calcification [name] reflect prior fat necrosis or previous focal hemorrhage. 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 Electronically Signed   By: Aletta Edouard M.D.   On: 08/12/2015 13:41   Dg Chest Port 1 View  08/11/2015  CLINICAL DATA:  Shortness of breath. EXAM: PORTABLE CHEST 1 VIEW COMPARISON:  01/16/2015 FINDINGS: Mild linear scarring in the right lower lobe. Lungs appear otherwise clear. Cardiac and mediastinal margins appear normal. No pleural effusion identified. IMPRESSION: 1.  No active cardiopulmonary disease is radiographically apparent. 2. Chronic scarring in the right lower lobe. Electronically Signed   By: Van Clines M.D.   On: 08/11/2015 08:03     CBC  Recent Labs Lab 08/10/15 1614 08/10/15 1721 08/11/15 0352 08/12/15 0935 08/13/15 0357  WBC 11.9* 12.5* 13.9* 15.9*  --   HGB 13.9 14.0 12.1 10.8* 10.0*  HCT 42.4 42.4 36.4 33.2* 30.9*  PLT 377 335 297 248  --   MCV 83.5 84.5 83.1 83.0  --   MCH 27.4 27.9 27.6 27.0  --   MCHC 32.8 33.0 33.2 32.5  --   RDW 16.6* 16.6* 16.9* 17.5*  --   LYMPHSABS  --   --  2.9  --   --   MONOABS  --   --  1.1*  --   --   EOSABS  --   --  0.1  --   --   BASOSABS  --   --  0.0  --   --      Chemistries   Recent Labs Lab 08/10/15 1721 08/11/15 0352 08/11/15 1124 08/12/15 0757 08/13/15 0357  NA 132* 140 140 136  --   K 5.0 3.9 3.9 3.7  --   CL 100* 112* 113* 106  --   CO2 12* 16* 16* 14*  --   GLUCOSE 677* 230* 172* 268*  --   BUN 9 6 7  <5*  --   CREATININE 1.24* 0.88 0.76 0.83  --   CALCIUM 9.7 8.9 8.7* 8.8*  --   MG  --   --  1.5* 1.5* 1.7  AST  --  13*  --   --   --   ALT  --  16  --   --   --   ALKPHOS  --  92  --   --   --   BILITOT  --  0.9  --   --   --    ------------------------------------------------------------------------------------------------------------------ No results for input(s): CHOL, HDL, LDLCALC, TRIG, CHOLHDL, LDLDIRECT in the last 72 hours.  Lab Results  Component Value Date   HGBA1C 11.7* 08/11/2015   ------------------------------------------------------------------------------------------------------------------  Recent Labs  08/12/15 0935  TSH 0.432   ------------------------------------------------------------------------------------------------------------------ No results for input(s): VITAMINB12, FOLATE, FERRITIN, TIBC, IRON, RETICCTPCT in the last 72 hours.  Coagulation profile  Recent Labs Lab 08/13/15 0357  INR 1.27    No results for input(s): DDIMER in the last 72 hours.  Cardiac Enzymes No results for input(s): CKMB, TROPONINI, MYOGLOBIN in the last 168 hours.  Invalid input(s): CK ------------------------------------------------------------------------------------------------------------------    Component Value Date/Time   BNP 192.8* 09/08/2014 1345    Time Spent in minutes   35   SINGH,PRASHANT K M.D on 08/13/2015 at 9:49 AM  Between 7am to 7pm - Pager - [phone]  After 7pm go to [email or web address] - password [email or web address]  [facility] -  Office  [phone]

## 2015-08-13 NOTE — Discharge Instructions (Signed)
Follow your CT scan findings, you have a Adrenal Mass that needs to be monitored.  Follow with Primary MD Minerva Ends, MD in 2 days and your OB physician in 2 days   Get CBC, CMP, 2 view Chest X ray checked  by Primary MD next visit.    Activity: As tolerated with Full fall precautions use walker/cane & assistance as needed   Disposition Home    Diet:   Heart Healthy Low Carb.  Accuchecks 4 times/day, Once in AM empty stomach and then before each meal. Log in all results and show them to your Prim.MD in 3 days. If any glucose reading is under 80 or above 300 call your Prim MD immidiately. Follow Low glucose instructions for glucose under 80 as instructed.   For Heart failure patients - Check your Weight same time everyday, if you gain over 2 pounds, or you develop in leg swelling, experience more shortness of breath or chest pain, call your Primary MD immediately. Follow Cardiac Low Salt Diet and 1.5 lit/day fluid restriction.   On your next visit with your primary care physician please Get Medicines reviewed and adjusted.   Please request your Prim.MD to go over all Hospital Tests and Procedure/Radiological results at the follow up, please get all Hospital records sent to your Prim MD by signing hospital release before you go home.   If you experience worsening of your admission symptoms, develop shortness of breath, life threatening emergency, suicidal or homicidal thoughts you must seek medical attention immediately by calling 911 or calling your MD immediately  if symptoms less severe.  You Must read complete instructions/literature along with all the possible adverse reactions/side effects for all the Medicines you take and that have been prescribed to you. Take any new Medicines after you have completely understood and accpet all the possible adverse reactions/side effects.   Do not drive, operating heavy machinery, perform activities at heights, swimming or  participation in water activities or provide baby sitting services if your were admitted for syncope or siezures until you have seen by Primary MD or a Neurologist and advised to do so again.  Do not drive when taking Pain medications.    Do not take more than prescribed Pain, Sleep and Anxiety Medications  Special Instructions: If you have smoked or chewed Tobacco  in the last 2 yrs please stop smoking, stop any regular Alcohol  and or any Recreational drug use.  Wear Seat belts while driving.   Please note  You were cared for by a hospitalist during your hospital stay. If you have any questions about your discharge medications or the care you received while you were in the hospital after you are discharged, you can call the unit and asked to speak with the hospitalist on call if the hospitalist that took care of you is not available. Once you are discharged, your primary care physician will handle any further medical issues. Please note that NO REFILLS for any discharge medications will be authorized once you are discharged, as it is imperative that you return to your primary care physician (or establish a relationship with a primary care physician if you do not have one) for your aftercare needs so that they can reassess your need for medications and monitor your lab values.

## 2015-08-13 NOTE — Procedures (Signed)
Post UFE.   No immediate post procedural complications.   EBL: None Keep right leg straight for 4 hrs.    SignedSandi Mariscal [phone] 08/13/2015, 12:04 PM

## 2015-08-14 ENCOUNTER — Other Ambulatory Visit: Payer: Self-pay | Admitting: Family Medicine

## 2015-08-14 ENCOUNTER — Ambulatory Visit (HOSPITAL_COMMUNITY): Payer: Medicaid Other

## 2015-08-14 DIAGNOSIS — N939 Abnormal uterine and vaginal bleeding, unspecified: Secondary | ICD-10-CM | POA: Insufficient documentation

## 2015-08-14 DIAGNOSIS — Z86711 Personal history of pulmonary embolism: Secondary | ICD-10-CM

## 2015-08-14 DIAGNOSIS — J4520 Mild intermittent asthma, uncomplicated: Secondary | ICD-10-CM

## 2015-08-14 DIAGNOSIS — D259 Leiomyoma of uterus, unspecified: Secondary | ICD-10-CM | POA: Insufficient documentation

## 2015-08-14 LAB — TYPE AND SCREEN
ABO/RH(D): O POS
Antibody Screen: NEGATIVE
Unit division: 0
Unit division: 0

## 2015-08-14 LAB — BASIC METABOLIC PANEL
Anion gap: 11 (ref 5–15)
BUN: <5 mg/dL — ABNORMAL LOW (ref 6–20)
CALCIUM: 8.9 mg/dL (ref 8.9–10.3)
CHLORIDE: 110 mmol/L (ref 101–111)
CO2: 17 mmol/L — AB (ref 22–32)
CREATININE: 0.64 mg/dL (ref 0.44–1.00)
GFR calc non Af Amer: >60 mL/min (ref >60)
GLUCOSE: 274 mg/dL — AB (ref 65–99)
Potassium: 3.7 mmol/L (ref 3.5–5.1)
Sodium: 138 mmol/L (ref 135–145)

## 2015-08-14 LAB — CBC
HEMATOCRIT: 30.2 % — AB (ref 36.0–46.0)
HEMOGLOBIN: 9.9 g/dL — AB (ref 12.0–15.0)
MCH: 27.7 pg (ref 26.0–34.0)
MCHC: 32.8 g/dL (ref 30.0–36.0)
MCV: 84.6 fL (ref 78.0–100.0)
Platelets: 215 10*3/uL (ref 150–400)
RBC: 3.57 MIL/uL — ABNORMAL LOW (ref 3.87–5.11)
RDW: 18.6 % — AB (ref 11.5–15.5)
WBC: 9.6 10*3/uL (ref 4.0–10.5)

## 2015-08-14 LAB — GLUCOSE, CAPILLARY
GLUCOSE-CAPILLARY: 305 mg/dL — AB (ref 65–99)
Glucose-Capillary: 250 mg/dL — ABNORMAL HIGH (ref 65–99)

## 2015-08-14 MED ORDER — METOPROLOL TARTRATE 50 MG PO TABS: 50.0000 mg | ORAL_TABLET | Freq: Two times a day (BID) | ORAL | Status: DC

## 2015-08-14 MED ORDER — FERROUS SULFATE 325 (65 FE) MG PO TABS: 325.0000 mg | ORAL_TABLET | Freq: Two times a day (BID) | ORAL | Status: DC

## 2015-08-14 MED ORDER — HYDROMORPHONE HCL 1 MG/ML IJ SOLN: 1.0000 mg | INTRAMUSCULAR | Status: DC | PRN

## 2015-08-14 MED ORDER — DOCUSATE SODIUM 100 MG PO CAPS: 100.0000 mg | ORAL_CAPSULE | Freq: Two times a day (BID) | ORAL | Status: DC

## 2015-08-14 MED ORDER — INSULIN GLARGINE 100 UNIT/ML ~~LOC~~ SOLN: 30.0000 [IU] | Freq: Every day | SUBCUTANEOUS | Status: DC

## 2015-08-14 MED ORDER — METFORMIN HCL 850 MG PO TABS: 850.0000 mg | ORAL_TABLET | Freq: Two times a day (BID) | ORAL | Status: DC

## 2015-08-14 MED ORDER — HYDROCODONE-ACETAMINOPHEN 5-325 MG PO TABS: 1.0000 | ORAL_TABLET | Freq: Four times a day (QID) | ORAL | Status: DC | PRN

## 2015-08-14 MED ORDER — INSULIN GLARGINE 100 UNIT/ML ~~LOC~~ SOLN
30.0000 [IU] | Freq: Two times a day (BID) | SUBCUTANEOUS | Status: DC
  Administered 2015-08-14: 30 [IU] via SUBCUTANEOUS
  Filled 2015-08-14 (×2): qty 0.3

## 2015-08-14 NOTE — Progress Notes (Addendum)
Melinda [name] to be D/C'd Home per MD order.  Discussed with the patient and all questions fully answered.  VSS. IV catheter discontinued intact. Site without signs and symptoms of complications. Dressing and pressure applied.  An After Visit Summary was printed and given to the patient. Patient received prescription education and patient's [facility] picked up RX scripts on unit at 1:56pm.   D/c education completed with patient/family including follow up instructions, medication list, d/c activities limitations if indicated, with other d/c instructions as indicated by MD - patient able to verbalize understanding, all questions fully answered.   Patient instructed to return to ED, call 911, or call MD for any changes in condition.   Patient escorted via [place], and D/C home via taxi voucher.  [place], [name] 08/14/2015 12:50 PM

## 2015-08-14 NOTE — Progress Notes (Signed)
CSW consulted regarding transportation needs at discharge. Patient reports having no way home and no money with her. CSW provided taxi voucher.  CSW signing off.  Percell Locus Ilia Engelbert LCSWA [phone]

## 2015-08-14 NOTE — Discharge Summary (Addendum)
Melinda [name], is a 50 y.o. female  DOB [birth year]  MRN [id number].  Admission date:  08/10/2015  Admitting Physician  Reubin Milan, MD  Discharge Date:  08/14/2015   Primary MD  Minerva Ends, MD  Recommendations for primary care physician for things to follow:   Monitor glycemic control, A1c, CBC and BMP closely, must follow with OB and IR on a close basis.   Admission Diagnosis  Abnormal vaginal bleeding [N93.9] Elevated serum creatinine [R74.8] Abnormal uterine bleeding (AUB) [N93.9] Diabetic ketoacidosis without coma associated with other specified diabetes mellitus ([place]) [E13.10]   Discharge Diagnosis  Abnormal vaginal bleeding [N93.9] Elevated serum creatinine [R74.8] Abnormal uterine bleeding (AUB) [N93.9] Diabetic ketoacidosis without coma associated with other specified diabetes mellitus ([place]) [E13.10]     Principal Problem:   DKA (diabetic ketoacidoses) ([place]) Active Problems:   Sinus tachycardia (H[facility])   Morbid obesity (H[facility])   Fibroid, uterine   Asthma   History of pulmonary embolism   Abnormal uterine bleeding (AUB)   Leiomyoma of uterus      Past Medical History  Diagnosis Date  . Migraine   . PTSD (post-traumatic stress disorder)   . Asthma   . GERD (gastroesophageal reflux disease)   . Scoliosis   . Arthritis   . ADHD (attention deficit hyperactivity disorder)   . Peripheral neuropathy ([place])   . Iron deficiency anemia due to chronic blood loss 09/09/2014  . Hyperglycemia 09/09/2014  . Low TSH level 09/08/2014  . Menorrhagia 09/08/2014  . Ovarian mass, right 09/09/2014  . PE (pulmonary embolism)   . Lumbar herniated disc   . Multiple thyroid nodules     Past Surgical History  Procedure Laterality Date  . Cholecystectomy         HPI  from the history and physical done  on the day of admission:    Melinda [name] is a 50 y.o. female with a past medical history of asthma, PTSD, migraine headaches, GERD, scoliosis, osteoarthritis, ADHD, iron deficiency anemia, hyperglycemia, pulmonary embolism (was on anticoagulation until recently, but discontinued due to vaginal bleed), right ovarian mass who was referred to the emergency department from the maternity unit after she was sent by her gynecologist for hemoglobin and glucose check, and was found to be significantly hyperglycemic.  Per patient, she has been having polydipsia, polyuria, occasional dizziness and blurred vision. She is states that she has had hyperglycemia in the past, but has not been diagnosed before with diabetes. Workup in the emergency department shows bile use consistent with DKA.   When seen in the emergency department, the patient was in no acute distress.     Hospital Course:    1.DKA in a newly diagnosed DM2 - pending A1c, was treated with IV insulin drip, IV fluids and supportive care. Anion gap has closed. Has been transitioned to Lantus and sliding scale, Glucophage added, diabetic educator on board.  2. Persistent sinus tachycardia. Has history of PE and now off anticoagulation since January -ve CT angiogram chest, stable TSH ,  stable recent echogram, follow with PCP.  3. Uterine Fibroids with ongoing dysfunctional uterine bleeding. On Megace, following with OB, she was due for hysterectomy in the outpatient setting. Continues to have bleeding inside the hospital, had requested Dr. Kennon Rounds covering OB to evaluate the patient. Per OB IR did uterine artery embolization with good effect on 08/13/15, stable H&H and bleeding minimal, will follow with OB and PCP after discharge.   4.Hx of PE - negative CT angiogram this admission, off of Eliquis since January. Discussed with Dr. Clarita Leber on call okay to continue Megace no increased risk of PE per Dr. Kennon Rounds.  5. Morbid obesity. Outpatient follow-up  with PCP.  6. Hx of Asthma - no wheezing or shortness of breath, supportive care.   7. Essential hypertension. Added Lopressor for better control.    Discharge Condition: Stable  Follow UP  Follow-up Information    Follow up with Minerva Ends, MD. Schedule an appointment as soon as possible for a visit in 2 days.   Specialty:  Family Medicine   Contact information:   [place] [address] [phone]       Follow up with Lavonia Drafts, MD. Schedule an appointment as soon as possible for a visit in 2 days.   Specialty:  Obstetrics and Gynecology   Contact information:   [place] [address] [phone]       Follow up with [name], MD In 3 weeks.   Specialty:  Interventional Radiology   Why:  pt will hear from IR clinic for 2-4 week follow up with Dr Pascal Lux;  [phone]   Contact information:   [address] [place] [address] [phone]        Consults obtained - IR, OB  Diet and Activity recommendation: See Discharge Instructions below  Discharge Instructions       Discharge Instructions    Ambulatory referral to Nutrition and Diabetic Education    Complete by:  As directed   New onset diabetes     Discharge instructions    Complete by:  As directed   Follow your CT scan findings, you have a Adrenal Mass that needs to be monitored.  Follow with Primary MD Minerva Ends, MD in 2 days and your OB physician in 2 days   Get CBC, CMP, 2 view Chest X ray checked  by Primary MD next visit.    Activity: As tolerated with Full fall precautions use walker/cane & assistance as needed   Disposition Home    Diet:   Heart Healthy Low Carb.  Accuchecks 4 times/day, Once in AM empty stomach and then before each meal. Log in all results and show them to your Prim.MD in 3 days. If any glucose reading is under 80 or above 300 call your Prim MD immidiately. Follow Low glucose  instructions for glucose under 80 as instructed.   For Heart failure patients - Check your Weight same time everyday, if you gain over 2 pounds, or you develop in leg swelling, experience more shortness of breath or chest pain, call your Primary MD immediately. Follow Cardiac Low Salt Diet and 1.5 lit/day fluid restriction.   On your next visit with your primary care physician please Get Medicines reviewed and adjusted.   Please request your Prim.MD to go over all Hospital Tests and Procedure/Radiological results at the follow up, please get all Hospital records sent to your Prim MD by signing hospital release  before you go home.   If you experience worsening of your admission symptoms, develop shortness of breath, life threatening emergency, suicidal or homicidal thoughts you must seek medical attention immediately by calling 911 or calling your MD immediately  if symptoms less severe.  You Must read complete instructions/literature along with all the possible adverse reactions/side effects for all the Medicines you take and that have been prescribed to you. Take any new Medicines after you have completely understood and accpet all the possible adverse reactions/side effects.   Do not drive, operating heavy machinery, perform activities at heights, swimming or participation in water activities or provide baby sitting services if your were admitted for syncope or siezures until you have seen by Primary MD or a Neurologist and advised to do so again.  Do not drive when taking Pain medications.    Do not take more than prescribed Pain, Sleep and Anxiety Medications  Special Instructions: If you have smoked or chewed Tobacco  in the last 2 yrs please stop smoking, stop any regular Alcohol  and or any Recreational drug use.  Wear Seat belts while driving.   Please note  You were cared for by a hospitalist during your hospital stay. If you have any questions about your discharge medications  or the care you received while you were in the hospital after you are discharged, you can call the unit and asked to speak with the hospitalist on call if the hospitalist that took care of you is not available. Once you are discharged, your primary care physician will handle any further medical issues. Please note that NO REFILLS for any discharge medications will be authorized once you are discharged, as it is imperative that you return to your primary care physician (or establish a relationship with a primary care physician if you do not have one) for your aftercare needs so that they can reassess your need for medications and monitor your lab values.     Increase activity slowly    Complete by:  As directed              Discharge Medications       Medication List    TAKE these medications        acetaminophen 500 MG tablet  Commonly known as:  TYLENOL  Take 500-1,000 mg by mouth every 6 (six) hours as needed for moderate pain (patient takes 1-2 depending on pain level).     albuterol 108 (90 Base) MCG/ACT inhaler  Commonly known as:  PROVENTIL HFA;VENTOLIN HFA  Inhale 2 puffs into the lungs every 6 (six) hours as needed for wheezing or shortness of breath.     butalbital-acetaminophen-caffeine 50-325-40 MG tablet  Commonly known as:  FIORICET  Take 1-2 tablets by mouth every 8 (eight) hours as needed for headache.     diphenhydrAMINE 25 MG tablet  Commonly known as:  BENADRYL  Take 50 mg by mouth at bedtime as needed for sleep (sleep).     docusate sodium 100 MG capsule  Commonly known as:  COLACE  Take 1 capsule (100 mg total) by mouth 2 (two) times daily.     ferrous sulfate 325 (65 FE) MG tablet  Take 1 tablet (325 mg total) by mouth 2 (two) times daily with a meal.     freestyle lancets  For glucose testing every before meals at bedtime. Diagnosis E 11.65.   Can substitute to any accepted brand     gabapentin 100 MG capsule  Commonly  known as:  NEURONTIN  TAKE 1  CAPSULE BY MOUTH THREE TIMES A DAY     glucose monitoring kit monitoring kit  1 each by Does not apply route 4 (four) times daily - after meals and at bedtime. 1 month Diabetic Testing Supplies for QAC-QHS accuchecks.Any brand OK. Diagnosis E11.65     HYDROcodone-acetaminophen 5-325 MG tablet  Commonly known as:  NORCO/VICODIN  Take 1-2 tablets by mouth every 6 (six) hours as needed for moderate pain.     insulin aspart 100 UNIT/ML injection  Commonly known as:  NOVOLOG  Before each meal 3 times a day, 140-199 - 2 units, 200-250 - 4 units, 251-299 - 6 units,  300-349 - 8 units,  350 or above 10 units. Dispense syringes and needles as needed, Ok to switch to PEN if approved. Substitute to any brand approved. DX DM2, Code E11.65     insulin glargine 100 UNIT/ML injection  Commonly known as:  LANTUS  Inject 0.3 mLs (30 Units total) into the skin daily. Dispense insulin pen if approved, if not dispense as needed syringes and needles for 1 month supply. Can switch to Levemir. Diagnosis E 11.65.     Insulin Syringe-Needle U-100 25G X 1" 1 ML Misc  For 4 times a day insulin SQ, 1 month supply. Diagnosis E11.65     megestrol 40 MG tablet  Commonly known as:  MEGACE  TAKE ONE (1) TABLET BY MOUTH 3 TIMES DAILY     metFORMIN 850 MG tablet  Commonly known as:  GLUCOPHAGE  Take 1 tablet (850 mg total) by mouth 2 (two) times daily with a meal.  Start taking on:  08/16/2015     metoprolol 50 MG tablet  Commonly known as:  LOPRESSOR  Take 1 tablet (50 mg total) by mouth 2 (two) times daily.     MULTIVITAMIN & MINERAL PO  Take 1 tablet by mouth daily.     SUMAtriptan 50 MG tablet  Commonly known as:  IMITREX  Take 1 tablet (50 mg total) by mouth every 2 (two) hours as needed for migraine or headache. [name] repeat in 2 hours if headache persists or recurs. Do not exceed two doses in 7 day period.        Major procedures and Radiology Reports - PLEASE review detailed and final reports for all  details, in brief -    IR - uterine artery embolization on 08/13/2015   Ct Angio Chest Pe W/cm &/or Wo Cm  08/12/2015  CLINICAL DATA:  Shortness of breath for 2 weeks. Admitted with severe vaginal bleeding. EXAM: CT ANGIOGRAPHY CHEST WITH CONTRAST TECHNIQUE: Multidetector CT imaging of the chest was performed using the standard protocol during bolus administration of intravenous contrast. Multiplanar CT image reconstructions and MIPs were obtained to evaluate the vascular anatomy. CONTRAST:  115m OMNIPAQUE IOHEXOL 350 MG/ML SOLN COMPARISON:  Chest x-ray on 08/10/2005 FINDINGS: The central pulmonary arteries are well opacified. Peripheral branch artery opacification is somewhat limited due to timing of contrast bolus and patient body habitus. There is no evidence of pulmonary embolism. The thoracic aorta is normal in caliber. The heart size is normal. No pleural or pericardial fluid identified. Lungs show no evidence of edema, infiltrate or nodule. Mild scarring/ atelectasis present at both lung bases. Focal followup of the anterior right upper lobe without pneumothorax. No lymphadenopathy identified. In the upper abdomen, there is an incidental mass just superior to the right kidney and likely originating from the right adrenal gland. This  lesion measures approximately 5.3 x 4.8 x 4.4 cm and contains areas of macroscopic fat and overall is of low density (-15 to 4 HU) by Hounsfield measurements. There are some partial calcifications anteriorly within the lesion. 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 Review of the MIP images confirms the above findings. IMPRESSION: 1. No evidence of pulmonary embolism or other acute findings in the chest. 2. Incidental 5 cm mass of the right adrenal gland with low density, macroscopic internal fat and areas of anterior calcification. The focal areas of calcification [name] reflect prior fat necrosis or previous focal hemorrhage. 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 Electronically Signed   By: Aletta Edouard M.D.   On: 08/12/2015 13:41   Ir Angiogram Pelvis Selective Or Supraselective  08/13/2015  INDICATION: Symptomatic uterine fibroids. Patient with persistent uterine daily refractory uterine bleeding. EXAM: 1. SELECTIVE BILATERAL INTERNAL ILIAC ARTERIOGRAM 2. SELECTIVE BILATERAL ANTERIOR DIVISION INTERNAL ILIAC ARTERIOGRAM 3. SELECTIVE BILATERAL UTERINE ARTERY ARTERIOGRAM AND PERCUTANEOUS PARTICLE EMBOLIZATION. 4. ULTRASOUND GUIDANCE FOR ARTERIAL ACCESS MEDICATIONS: Zofran 4 mg IV, Vancomycin 1.5 g IV; The antibiotic was administered within one hour of the procedure ANESTHESIA/SEDATION: Moderate (conscious) sedation was employed during this procedure. A total of Toradol 30 mg, Versed 6 mg and Fentanyl 300 mcg was administered intravenously. Moderate Sedation Time: 120 minutes. The patient's level of consciousness and vital signs were monitored continuously by radiology nursing throughout the procedure under my direct supervision. CONTRAST:  253m OMNIPAQUE IOHEXOL 300 MG/ML  SOLN FLUOROSCOPY TIME:  38 minutes 24 seconds (43,474mGy) COMPLICATIONS: None immediate. PROCEDURE: Informed consent was obtained from the patient following explanation of the procedure, risks, benefits and alternatives. The patient understands, agrees and consents for the procedure. All questions were addressed. A time out was performed prior to the initiation of the procedure. Maximal barrier sterile technique utilized including caps, mask, sterile gowns, sterile gloves, large sterile drape, hand hygiene, and Betadine prep. The right femoral head was marked fluoroscopically. Under sterile conditions and local anesthesia, the right common femoral artery access was performed with a micropuncture needle. Under direct ultrasound guidance, the right common femoral was accessed with a micropuncture kit. An ultrasound image was saved for documentation purposes. This allowed for  placement of a 5-French vascular sheath. A limited arteriogram was performed through the side arm of the sheath confirming appropriate access within the right common femoral artery. The 5-French C2 catheter was utilized to select the contralateral left internal iliac artery. Selective left internal iliac angiogram was performed. The tortuous left uterine artery was identified. Selective catheterization was performed of the left uterine artery with a microcatheter and micro guide wire. A selective left uterine angiogram was performed. This demonstrated patency of the left uterine artery. Mild diffuse hypervascularity of the enlarged fibroid uterus. Access was adequate for embolization. For embolization, 3 vials of 500 - 700 micron and 2 vials of 700-900 micron Embospheres were injected into the left uterine artery. Post embolization angiogram confirms complete stasis of the left uterine vascular territory. Microcatheter was removed. The C2 catheter was retracted and utilized to select the right internal iliac artery. Selective right internal iliac angiogram was performed. The patent right uterine artery was identified. For selective catheterization, the micro catheter and guidewire were utilized to select the right uterine artery. Selective right uterine angiogram was performed. This demonstrated patency of the right uterine artery. Catheter position was safe for embolization. Embolization was performed to complete stasis with injection of 2 vials of 500-700 micron  Embospheres. Post embolization angiogram confirms complete stasis of the right uterine vascular territory. At this point, all wires, catheters and sheaths were removed from the patient. Hemostasis was achieved at the right groin access site with The patient tolerated the procedure well without immediate post procedural complication. FINDINGS: Conventional branching pattern of the bilateral internal iliac arteries. The bilateral uterine artery origins were  noted to be markedly tortuous though lobe both vessels are widely patent supplying a markedly enlarged and myomatous uterus. Completion arteriograms following bilateral uterine artery particle embolization demonstrates a technically excellent result with stasis of flow within the bilateral uterine vascular territories. IMPRESSION: Successful bilateral uterine artery embolization ([facility]). Electronically Signed   By: [name] M.D.   On: 08/13/2015 12:33   Ir Angiogram Pelvis Selective Or Supraselective  08/13/2015  INDICATION: Symptomatic uterine fibroids. Patient with persistent uterine daily refractory uterine bleeding. EXAM: 1. SELECTIVE BILATERAL INTERNAL ILIAC ARTERIOGRAM 2. SELECTIVE BILATERAL ANTERIOR DIVISION INTERNAL ILIAC ARTERIOGRAM 3. SELECTIVE BILATERAL UTERINE ARTERY ARTERIOGRAM AND PERCUTANEOUS PARTICLE EMBOLIZATION. 4. ULTRASOUND GUIDANCE FOR ARTERIAL ACCESS MEDICATIONS: Zofran 4 mg IV, Vancomycin 1.5 g IV; The antibiotic was administered within one hour of the procedure ANESTHESIA/SEDATION: Moderate (conscious) sedation was employed during this procedure. A total of Toradol 30 mg, Versed 6 mg and Fentanyl 300 mcg was administered intravenously. Moderate Sedation Time: 120 minutes. The patient's level of consciousness and vital signs were monitored continuously by radiology nursing throughout the procedure under my direct supervision. CONTRAST:  246m OMNIPAQUE IOHEXOL 300 MG/ML  SOLN FLUOROSCOPY TIME:  38 minutes 24 seconds (48,333mGy) COMPLICATIONS: None immediate. PROCEDURE: Informed consent was obtained from the patient following explanation of the procedure, risks, benefits and alternatives. The patient understands, agrees and consents for the procedure. All questions were addressed. A time out was performed prior to the initiation of the procedure. Maximal barrier sterile technique utilized including caps, mask, sterile gowns, sterile gloves, large sterile drape, hand hygiene, and Betadine  prep. The right femoral head was marked fluoroscopically. Under sterile conditions and local anesthesia, the right common femoral artery access was performed with a micropuncture needle. Under direct ultrasound guidance, the right common femoral was accessed with a micropuncture kit. An ultrasound image was saved for documentation purposes. This allowed for placement of a 5-French vascular sheath. A limited arteriogram was performed through the side arm of the sheath confirming appropriate access within the right common femoral artery. The 5-French C2 catheter was utilized to select the contralateral left internal iliac artery. Selective left internal iliac angiogram was performed. The tortuous left uterine artery was identified. Selective catheterization was performed of the left uterine artery with a microcatheter and micro guide wire. A selective left uterine angiogram was performed. This demonstrated patency of the left uterine artery. Mild diffuse hypervascularity of the enlarged fibroid uterus. Access was adequate for embolization. For embolization, 3 vials of 500 - 700 micron and 2 vials of 700-900 micron Embospheres were injected into the left uterine artery. Post embolization angiogram confirms complete stasis of the left uterine vascular territory. Microcatheter was removed. The C2 catheter was retracted and utilized to select the right internal iliac artery. Selective right internal iliac angiogram was performed. The patent right uterine artery was identified. For selective catheterization, the micro catheter and guidewire were utilized to select the right uterine artery. Selective right uterine angiogram was performed. This demonstrated patency of the right uterine artery. Catheter position was safe for embolization. Embolization was performed to complete stasis with injection of 2 vials of  500-700 micron Embospheres. Post embolization angiogram confirms complete stasis of the right uterine vascular  territory. At this point, all wires, catheters and sheaths were removed from the patient. Hemostasis was achieved at the right groin access site with The patient tolerated the procedure well without immediate post procedural complication. FINDINGS: Conventional branching pattern of the bilateral internal iliac arteries. The bilateral uterine artery origins were noted to be markedly tortuous though lobe both vessels are widely patent supplying a markedly enlarged and myomatous uterus. Completion arteriograms following bilateral uterine artery particle embolization demonstrates a technically excellent result with stasis of flow within the bilateral uterine vascular territories. IMPRESSION: Successful bilateral uterine artery embolization ([facility]). Electronically Signed   By: [name] M.D.   On: 08/13/2015 12:33   Ir Angiogram Selective Each Additional Vessel  08/13/2015  INDICATION: Symptomatic uterine fibroids. Patient with persistent uterine daily refractory uterine bleeding. EXAM: 1. SELECTIVE BILATERAL INTERNAL ILIAC ARTERIOGRAM 2. SELECTIVE BILATERAL ANTERIOR DIVISION INTERNAL ILIAC ARTERIOGRAM 3. SELECTIVE BILATERAL UTERINE ARTERY ARTERIOGRAM AND PERCUTANEOUS PARTICLE EMBOLIZATION. 4. ULTRASOUND GUIDANCE FOR ARTERIAL ACCESS MEDICATIONS: Zofran 4 mg IV, Vancomycin 1.5 g IV; The antibiotic was administered within one hour of the procedure ANESTHESIA/SEDATION: Moderate (conscious) sedation was employed during this procedure. A total of Toradol 30 mg, Versed 6 mg and Fentanyl 300 mcg was administered intravenously. Moderate Sedation Time: 120 minutes. The patient's level of consciousness and vital signs were monitored continuously by radiology nursing throughout the procedure under my direct supervision. CONTRAST:  235m OMNIPAQUE IOHEXOL 300 MG/ML  SOLN FLUOROSCOPY TIME:  38 minutes 24 seconds (45,638mGy) COMPLICATIONS: None immediate. PROCEDURE: Informed consent was obtained from the patient following  explanation of the procedure, risks, benefits and alternatives. The patient understands, agrees and consents for the procedure. All questions were addressed. A time out was performed prior to the initiation of the procedure. Maximal barrier sterile technique utilized including caps, mask, sterile gowns, sterile gloves, large sterile drape, hand hygiene, and Betadine prep. The right femoral head was marked fluoroscopically. Under sterile conditions and local anesthesia, the right common femoral artery access was performed with a micropuncture needle. Under direct ultrasound guidance, the right common femoral was accessed with a micropuncture kit. An ultrasound image was saved for documentation purposes. This allowed for placement of a 5-French vascular sheath. A limited arteriogram was performed through the side arm of the sheath confirming appropriate access within the right common femoral artery. The 5-French C2 catheter was utilized to select the contralateral left internal iliac artery. Selective left internal iliac angiogram was performed. The tortuous left uterine artery was identified. Selective catheterization was performed of the left uterine artery with a microcatheter and micro guide wire. A selective left uterine angiogram was performed. This demonstrated patency of the left uterine artery. Mild diffuse hypervascularity of the enlarged fibroid uterus. Access was adequate for embolization. For embolization, 3 vials of 500 - 700 micron and 2 vials of 700-900 micron Embospheres were injected into the left uterine artery. Post embolization angiogram confirms complete stasis of the left uterine vascular territory. Microcatheter was removed. The C2 catheter was retracted and utilized to select the right internal iliac artery. Selective right internal iliac angiogram was performed. The patent right uterine artery was identified. For selective catheterization, the micro catheter and guidewire were utilized to  select the right uterine artery. Selective right uterine angiogram was performed. This demonstrated patency of the right uterine artery. Catheter position was safe for embolization. Embolization was performed to complete stasis with injection of  2 vials of 500-700 micron Embospheres. Post embolization angiogram confirms complete stasis of the right uterine vascular territory. At this point, all wires, catheters and sheaths were removed from the patient. Hemostasis was achieved at the right groin access site with The patient tolerated the procedure well without immediate post procedural complication. FINDINGS: Conventional branching pattern of the bilateral internal iliac arteries. The bilateral uterine artery origins were noted to be markedly tortuous though lobe both vessels are widely patent supplying a markedly enlarged and myomatous uterus. Completion arteriograms following bilateral uterine artery particle embolization demonstrates a technically excellent result with stasis of flow within the bilateral uterine vascular territories. IMPRESSION: Successful bilateral uterine artery embolization ([facility]). Electronically Signed   By: [name] M.D.   On: 08/13/2015 12:33   [facility] Angiogram Selective Each Additional Vessel  08/13/2015  INDICATION: Symptomatic uterine fibroids. Patient with persistent uterine daily refractory uterine bleeding. EXAM: 1. SELECTIVE BILATERAL INTERNAL ILIAC ARTERIOGRAM 2. SELECTIVE BILATERAL ANTERIOR DIVISION INTERNAL ILIAC ARTERIOGRAM 3. SELECTIVE BILATERAL UTERINE ARTERY ARTERIOGRAM AND PERCUTANEOUS PARTICLE EMBOLIZATION. 4. ULTRASOUND GUIDANCE FOR ARTERIAL ACCESS MEDICATIONS: Zofran 4 mg IV, Vancomycin 1.5 g IV; The antibiotic was administered within one hour of the procedure ANESTHESIA/SEDATION: Moderate (conscious) sedation was employed during this procedure. A total of Toradol 30 mg, Versed 6 mg and Fentanyl 300 mcg was administered intravenously. Moderate Sedation Time: 120  minutes. The patient's level of consciousness and vital signs were monitored continuously by radiology nursing throughout the procedure under my direct supervision. CONTRAST:  23m OMNIPAQUE IOHEXOL 300 MG/ML  SOLN FLUOROSCOPY TIME:  38 minutes 24 seconds (47,793mGy) COMPLICATIONS: None immediate. PROCEDURE: Informed consent was obtained from the patient following explanation of the procedure, risks, benefits and alternatives. The patient understands, agrees and consents for the procedure. All questions were addressed. A time out was performed prior to the initiation of the procedure. Maximal barrier sterile technique utilized including caps, mask, sterile gowns, sterile gloves, large sterile drape, hand hygiene, and Betadine prep. The right femoral head was marked fluoroscopically. Under sterile conditions and local anesthesia, the right common femoral artery access was performed with a micropuncture needle. Under direct ultrasound guidance, the right common femoral was accessed with a micropuncture kit. An ultrasound image was saved for documentation purposes. This allowed for placement of a 5-French vascular sheath. A limited arteriogram was performed through the side arm of the sheath confirming appropriate access within the right common femoral artery. The 5-French C2 catheter was utilized to select the contralateral left internal iliac artery. Selective left internal iliac angiogram was performed. The tortuous left uterine artery was identified. Selective catheterization was performed of the left uterine artery with a microcatheter and micro guide wire. A selective left uterine angiogram was performed. This demonstrated patency of the left uterine artery. Mild diffuse hypervascularity of the enlarged fibroid uterus. Access was adequate for embolization. For embolization, 3 vials of 500 - 700 micron and 2 vials of 700-900 micron Embospheres were injected into the left uterine artery. Post embolization  angiogram confirms complete stasis of the left uterine vascular territory. Microcatheter was removed. The C2 catheter was retracted and utilized to select the right internal iliac artery. Selective right internal iliac angiogram was performed. The patent right uterine artery was identified. For selective catheterization, the micro catheter and guidewire were utilized to select the right uterine artery. Selective right uterine angiogram was performed. This demonstrated patency of the right uterine artery. Catheter position was safe for embolization. Embolization was performed to complete stasis with  injection of 2 vials of 500-700 micron Embospheres. Post embolization angiogram confirms complete stasis of the right uterine vascular territory. At this point, all wires, catheters and sheaths were removed from the patient. Hemostasis was achieved at the right groin access site with The patient tolerated the procedure well without immediate post procedural complication. FINDINGS: Conventional branching pattern of the bilateral internal iliac arteries. The bilateral uterine artery origins were noted to be markedly tortuous though lobe both vessels are widely patent supplying a markedly enlarged and myomatous uterus. Completion arteriograms following bilateral uterine artery particle embolization demonstrates a technically excellent result with stasis of flow within the bilateral uterine vascular territories. IMPRESSION: Successful bilateral uterine artery embolization ([facility]). Electronically Signed   By: [name] M.D.   On: 08/13/2015 12:33   Ir Angiogram Selective Each Additional Vessel  08/13/2015  INDICATION: Symptomatic uterine fibroids. Patient with persistent uterine daily refractory uterine bleeding. EXAM: 1. SELECTIVE BILATERAL INTERNAL ILIAC ARTERIOGRAM 2. SELECTIVE BILATERAL ANTERIOR DIVISION INTERNAL ILIAC ARTERIOGRAM 3. SELECTIVE BILATERAL UTERINE ARTERY ARTERIOGRAM AND PERCUTANEOUS PARTICLE  EMBOLIZATION. 4. ULTRASOUND GUIDANCE FOR ARTERIAL ACCESS MEDICATIONS: Zofran 4 mg IV, Vancomycin 1.5 g IV; The antibiotic was administered within one hour of the procedure ANESTHESIA/SEDATION: Moderate (conscious) sedation was employed during this procedure. A total of Toradol 30 mg, Versed 6 mg and Fentanyl 300 mcg was administered intravenously. Moderate Sedation Time: 120 minutes. The patient's level of consciousness and vital signs were monitored continuously by radiology nursing throughout the procedure under my direct supervision. CONTRAST:  262m OMNIPAQUE IOHEXOL 300 MG/ML  SOLN FLUOROSCOPY TIME:  38 minutes 24 seconds (44,097mGy) COMPLICATIONS: None immediate. PROCEDURE: Informed consent was obtained from the patient following explanation of the procedure, risks, benefits and alternatives. The patient understands, agrees and consents for the procedure. All questions were addressed. A time out was performed prior to the initiation of the procedure. Maximal barrier sterile technique utilized including caps, mask, sterile gowns, sterile gloves, large sterile drape, hand hygiene, and Betadine prep. The right femoral head was marked fluoroscopically. Under sterile conditions and local anesthesia, the right common femoral artery access was performed with a micropuncture needle. Under direct ultrasound guidance, the right common femoral was accessed with a micropuncture kit. An ultrasound image was saved for documentation purposes. This allowed for placement of a 5-French vascular sheath. A limited arteriogram was performed through the side arm of the sheath confirming appropriate access within the right common femoral artery. The 5-French C2 catheter was utilized to select the contralateral left internal iliac artery. Selective left internal iliac angiogram was performed. The tortuous left uterine artery was identified. Selective catheterization was performed of the left uterine artery with a microcatheter and  micro guide wire. A selective left uterine angiogram was performed. This demonstrated patency of the left uterine artery. Mild diffuse hypervascularity of the enlarged fibroid uterus. Access was adequate for embolization. For embolization, 3 vials of 500 - 700 micron and 2 vials of 700-900 micron Embospheres were injected into the left uterine artery. Post embolization angiogram confirms complete stasis of the left uterine vascular territory. Microcatheter was removed. The C2 catheter was retracted and utilized to select the right internal iliac artery. Selective right internal iliac angiogram was performed. The patent right uterine artery was identified. For selective catheterization, the micro catheter and guidewire were utilized to select the right uterine artery. Selective right uterine angiogram was performed. This demonstrated patency of the right uterine artery. Catheter position was safe for embolization. Embolization was performed to complete  stasis with injection of 2 vials of 500-700 micron Embospheres. Post embolization angiogram confirms complete stasis of the right uterine vascular territory. At this point, all wires, catheters and sheaths were removed from the patient. Hemostasis was achieved at the right groin access site with The patient tolerated the procedure well without immediate post procedural complication. FINDINGS: Conventional branching pattern of the bilateral internal iliac arteries. The bilateral uterine artery origins were noted to be markedly tortuous though lobe both vessels are widely patent supplying a markedly enlarged and myomatous uterus. Completion arteriograms following bilateral uterine artery particle embolization demonstrates a technically excellent result with stasis of flow within the bilateral uterine vascular territories. IMPRESSION: Successful bilateral uterine artery embolization ([facility]). Electronically Signed   By: [name] M.D.   On: 08/13/2015 12:33   Ir  Angiogram Selective Each Additional Vessel  08/13/2015  INDICATION: Symptomatic uterine fibroids. Patient with persistent uterine daily refractory uterine bleeding. EXAM: 1. SELECTIVE BILATERAL INTERNAL ILIAC ARTERIOGRAM 2. SELECTIVE BILATERAL ANTERIOR DIVISION INTERNAL ILIAC ARTERIOGRAM 3. SELECTIVE BILATERAL UTERINE ARTERY ARTERIOGRAM AND PERCUTANEOUS PARTICLE EMBOLIZATION. 4. ULTRASOUND GUIDANCE FOR ARTERIAL ACCESS MEDICATIONS: Zofran 4 mg IV, Vancomycin 1.5 g IV; The antibiotic was administered within one hour of the procedure ANESTHESIA/SEDATION: Moderate (conscious) sedation was employed during this procedure. A total of Toradol 30 mg, Versed 6 mg and Fentanyl 300 mcg was administered intravenously. Moderate Sedation Time: 120 minutes. The patient's level of consciousness and vital signs were monitored continuously by radiology nursing throughout the procedure under my direct supervision. CONTRAST:  248m OMNIPAQUE IOHEXOL 300 MG/ML  SOLN FLUOROSCOPY TIME:  38 minutes 24 seconds (49,024mGy) COMPLICATIONS: None immediate. PROCEDURE: Informed consent was obtained from the patient following explanation of the procedure, risks, benefits and alternatives. The patient understands, agrees and consents for the procedure. All questions were addressed. A time out was performed prior to the initiation of the procedure. Maximal barrier sterile technique utilized including caps, mask, sterile gowns, sterile gloves, large sterile drape, hand hygiene, and Betadine prep. The right femoral head was marked fluoroscopically. Under sterile conditions and local anesthesia, the right common femoral artery access was performed with a micropuncture needle. Under direct ultrasound guidance, the right common femoral was accessed with a micropuncture kit. An ultrasound image was saved for documentation purposes. This allowed for placement of a 5-French vascular sheath. A limited arteriogram was performed through the side arm of the  sheath confirming appropriate access within the right common femoral artery. The 5-French C2 catheter was utilized to select the contralateral left internal iliac artery. Selective left internal iliac angiogram was performed. The tortuous left uterine artery was identified. Selective catheterization was performed of the left uterine artery with a microcatheter and micro guide wire. A selective left uterine angiogram was performed. This demonstrated patency of the left uterine artery. Mild diffuse hypervascularity of the enlarged fibroid uterus. Access was adequate for embolization. For embolization, 3 vials of 500 - 700 micron and 2 vials of 700-900 micron Embospheres were injected into the left uterine artery. Post embolization angiogram confirms complete stasis of the left uterine vascular territory. Microcatheter was removed. The C2 catheter was retracted and utilized to select the right internal iliac artery. Selective right internal iliac angiogram was performed. The patent right uterine artery was identified. For selective catheterization, the micro catheter and guidewire were utilized to select the right uterine artery. Selective right uterine angiogram was performed. This demonstrated patency of the right uterine artery. Catheter position was safe for embolization. Embolization was performed  to complete stasis with injection of 2 vials of 500-700 micron Embospheres. Post embolization angiogram confirms complete stasis of the right uterine vascular territory. At this point, all wires, catheters and sheaths were removed from the patient. Hemostasis was achieved at the right groin access site with The patient tolerated the procedure well without immediate post procedural complication. FINDINGS: Conventional branching pattern of the bilateral internal iliac arteries. The bilateral uterine artery origins were noted to be markedly tortuous though lobe both vessels are widely patent supplying a markedly enlarged  and myomatous uterus. Completion arteriograms following bilateral uterine artery particle embolization demonstrates a technically excellent result with stasis of flow within the bilateral uterine vascular territories. IMPRESSION: Successful bilateral uterine artery embolization ([facility]). Electronically Signed   By: [name] M.D.   On: 08/13/2015 12:33   Ir Angiogram Follow Up Study  08/13/2015  INDICATION: Symptomatic uterine fibroids. Patient with persistent uterine daily refractory uterine bleeding. EXAM: 1. SELECTIVE BILATERAL INTERNAL ILIAC ARTERIOGRAM 2. SELECTIVE BILATERAL ANTERIOR DIVISION INTERNAL ILIAC ARTERIOGRAM 3. SELECTIVE BILATERAL UTERINE ARTERY ARTERIOGRAM AND PERCUTANEOUS PARTICLE EMBOLIZATION. 4. ULTRASOUND GUIDANCE FOR ARTERIAL ACCESS MEDICATIONS: Zofran 4 mg IV, Vancomycin 1.5 g IV; The antibiotic was administered within one hour of the procedure ANESTHESIA/SEDATION: Moderate (conscious) sedation was employed during this procedure. A total of Toradol 30 mg, Versed 6 mg and Fentanyl 300 mcg was administered intravenously. Moderate Sedation Time: 120 minutes. The patient's level of consciousness and vital signs were monitored continuously by radiology nursing throughout the procedure under my direct supervision. CONTRAST:  24m OMNIPAQUE IOHEXOL 300 MG/ML  SOLN FLUOROSCOPY TIME:  38 minutes 24 seconds (44,967mGy) COMPLICATIONS: None immediate. PROCEDURE: Informed consent was obtained from the patient following explanation of the procedure, risks, benefits and alternatives. The patient understands, agrees and consents for the procedure. All questions were addressed. A time out was performed prior to the initiation of the procedure. Maximal barrier sterile technique utilized including caps, mask, sterile gowns, sterile gloves, large sterile drape, hand hygiene, and Betadine prep. The right femoral head was marked fluoroscopically. Under sterile conditions and local anesthesia, the right common  femoral artery access was performed with a micropuncture needle. Under direct ultrasound guidance, the right common femoral was accessed with a micropuncture kit. An ultrasound image was saved for documentation purposes. This allowed for placement of a 5-French vascular sheath. A limited arteriogram was performed through the side arm of the sheath confirming appropriate access within the right common femoral artery. The 5-French C2 catheter was utilized to select the contralateral left internal iliac artery. Selective left internal iliac angiogram was performed. The tortuous left uterine artery was identified. Selective catheterization was performed of the left uterine artery with a microcatheter and micro guide wire. A selective left uterine angiogram was performed. This demonstrated patency of the left uterine artery. Mild diffuse hypervascularity of the enlarged fibroid uterus. Access was adequate for embolization. For embolization, 3 vials of 500 - 700 micron and 2 vials of 700-900 micron Embospheres were injected into the left uterine artery. Post embolization angiogram confirms complete stasis of the left uterine vascular territory. Microcatheter was removed. The C2 catheter was retracted and utilized to select the right internal iliac artery. Selective right internal iliac angiogram was performed. The patent right uterine artery was identified. For selective catheterization, the micro catheter and guidewire were utilized to select the right uterine artery. Selective right uterine angiogram was performed. This demonstrated patency of the right uterine artery. Catheter position was safe for embolization. Embolization was  performed to complete stasis with injection of 2 vials of 500-700 micron Embospheres. Post embolization angiogram confirms complete stasis of the right uterine vascular territory. At this point, all wires, catheters and sheaths were removed from the patient. Hemostasis was achieved at the right  groin access site with The patient tolerated the procedure well without immediate post procedural complication. FINDINGS: Conventional branching pattern of the bilateral internal iliac arteries. The bilateral uterine artery origins were noted to be markedly tortuous though lobe both vessels are widely patent supplying a markedly enlarged and myomatous uterus. Completion arteriograms following bilateral uterine artery particle embolization demonstrates a technically excellent result with stasis of flow within the bilateral uterine vascular territories. IMPRESSION: Successful bilateral uterine artery embolization ([facility]). Electronically Signed   By: [name] M.D.   On: 08/13/2015 12:33   Ir US Guide Vasc Access Right  08/13/2015  INDICATION: Symptomatic uterine fibroids. Patient with persistent uterine daily refractory uterine bleeding. EXAM: 1. SELECTIVE BILATERAL INTERNAL ILIAC ARTERIOGRAM 2. SELECTIVE BILATERAL ANTERIOR DIVISION INTERNAL ILIAC ARTERIOGRAM 3. SELECTIVE BILATERAL UTERINE ARTERY ARTERIOGRAM AND PERCUTANEOUS PARTICLE EMBOLIZATION. 4. ULTRASOUND GUIDANCE FOR ARTERIAL ACCESS MEDICATIONS: Zofran 4 mg IV, Vancomycin 1.5 g IV; The antibiotic was administered within one hour of the procedure ANESTHESIA/SEDATION: Moderate (conscious) sedation was employed during this procedure. A total of Toradol 30 mg, Versed 6 mg and Fentanyl 300 mcg was administered intravenously. Moderate Sedation Time: 120 minutes. The patient's level of consciousness and vital signs were monitored continuously by radiology nursing throughout the procedure under my direct supervision. CONTRAST:  2107m OMNIPAQUE IOHEXOL 300 MG/ML  SOLN FLUOROSCOPY TIME:  38 minutes 24 seconds (45,102mGy) COMPLICATIONS: None immediate. PROCEDURE: Informed consent was obtained from the patient following explanation of the procedure, risks, benefits and alternatives. The patient understands, agrees and consents for the procedure. All questions were  addressed. A time out was performed prior to the initiation of the procedure. Maximal barrier sterile technique utilized including caps, mask, sterile gowns, sterile gloves, large sterile drape, hand hygiene, and Betadine prep. The right femoral head was marked fluoroscopically. Under sterile conditions and local anesthesia, the right common femoral artery access was performed with a micropuncture needle. Under direct ultrasound guidance, the right common femoral was accessed with a micropuncture kit. An ultrasound image was saved for documentation purposes. This allowed for placement of a 5-French vascular sheath. A limited arteriogram was performed through the side arm of the sheath confirming appropriate access within the right common femoral artery. The 5-French C2 catheter was utilized to select the contralateral left internal iliac artery. Selective left internal iliac angiogram was performed. The tortuous left uterine artery was identified. Selective catheterization was performed of the left uterine artery with a microcatheter and micro guide wire. A selective left uterine angiogram was performed. This demonstrated patency of the left uterine artery. Mild diffuse hypervascularity of the enlarged fibroid uterus. Access was adequate for embolization. For embolization, 3 vials of 500 - 700 micron and 2 vials of 700-900 micron Embospheres were injected into the left uterine artery. Post embolization angiogram confirms complete stasis of the left uterine vascular territory. Microcatheter was removed. The C2 catheter was retracted and utilized to select the right internal iliac artery. Selective right internal iliac angiogram was performed. The patent right uterine artery was identified. For selective catheterization, the micro catheter and guidewire were utilized to select the right uterine artery. Selective right uterine angiogram was performed. This demonstrated patency of the right uterine artery. Catheter  position was safe for  embolization. Embolization was performed to complete stasis with injection of 2 vials of 500-700 micron Embospheres. Post embolization angiogram confirms complete stasis of the right uterine vascular territory. At this point, all wires, catheters and sheaths were removed from the patient. Hemostasis was achieved at the right groin access site with The patient tolerated the procedure well without immediate post procedural complication. FINDINGS: Conventional branching pattern of the bilateral internal iliac arteries. The bilateral uterine artery origins were noted to be markedly tortuous though lobe both vessels are widely patent supplying a markedly enlarged and myomatous uterus. Completion arteriograms following bilateral uterine artery particle embolization demonstrates a technically excellent result with stasis of flow within the bilateral uterine vascular territories. IMPRESSION: Successful bilateral uterine artery embolization ([facility]). Electronically Signed   By: [name] M.D.   On: 08/13/2015 12:33   Dg Chest Port 1 View  08/11/2015  CLINICAL DATA:  Shortness of breath. EXAM: PORTABLE CHEST 1 VIEW COMPARISON:  01/16/2015 FINDINGS: Mild linear scarring in the right lower lobe. Lungs appear otherwise clear. Cardiac and mediastinal margins appear normal. No pleural effusion identified. IMPRESSION: 1.  No active cardiopulmonary disease is radiographically apparent. 2. Chronic scarring in the right lower lobe. Electronically Signed   By: Van Clines M.D.   On: 08/11/2015 08:03   [place] Guide Road[employer]  08/13/2015  INDICATION: Symptomatic uterine fibroids. Patient with persistent uterine daily refractory uterine bleeding. EXAM: 1. SELECTIVE BILATERAL INTERNAL ILIAC ARTERIOGRAM 2. SELECTIVE BILATERAL ANTERIOR DIVISION INTERNAL ILIAC ARTERIOGRAM 3. SELECTIVE BILATERAL UTERINE ARTERY ARTERIOGRAM AND PERCUTANEOUS PARTICLE EMBOLIZATION. 4.  ULTRASOUND GUIDANCE FOR ARTERIAL ACCESS MEDICATIONS: Zofran 4 mg IV, Vancomycin 1.5 g IV; The antibiotic was administered within one hour of the procedure ANESTHESIA/SEDATION: Moderate (conscious) sedation was employed during this procedure. A total of Toradol 30 mg, Versed 6 mg and Fentanyl 300 mcg was administered intravenously. Moderate Sedation Time: 120 minutes. The patient's level of consciousness and vital signs were monitored continuously by radiology nursing throughout the procedure under my direct supervision. CONTRAST:  220m OMNIPAQUE IOHEXOL 300 MG/ML  SOLN FLUOROSCOPY TIME:  38 minutes 24 seconds (40,932mGy) COMPLICATIONS: None immediate. PROCEDURE: Informed consent was obtained from the patient following explanation of the procedure, risks, benefits and alternatives. The patient understands, agrees and consents for the procedure. All questions were addressed. A time out was performed prior to the initiation of the procedure. Maximal barrier sterile technique utilized including caps, mask, sterile gowns, sterile gloves, large sterile drape, hand hygiene, and Betadine prep. The right femoral head was marked fluoroscopically. Under sterile conditions and local anesthesia, the right common femoral artery access was performed with a micropuncture needle. Under direct ultrasound guidance, the right common femoral was accessed with a micropuncture kit. An ultrasound image was saved for documentation purposes. This allowed for placement of a 5-French vascular sheath. A limited arteriogram was performed through the side arm of the sheath confirming appropriate access within the right common femoral artery. The 5-French C2 catheter was utilized to select the contralateral left internal iliac artery. Selective left internal iliac angiogram was performed. The tortuous left uterine artery was identified. Selective catheterization was performed of the left uterine artery with a microcatheter and micro guide wire. A  selective left uterine angiogram was performed. This demonstrated patency of the left uterine artery. Mild diffuse hypervascularity of the enlarged fibroid uterus. Access was adequate for embolization. For embolization, 3 vials of 500 - 700 micron and 2 vials of 700-900  micron Embospheres were injected into the left uterine artery. Post embolization angiogram confirms complete stasis of the left uterine vascular territory. Microcatheter was removed. The C2 catheter was retracted and utilized to select the right internal iliac artery. Selective right internal iliac angiogram was performed. The patent right uterine artery was identified. For selective catheterization, the micro catheter and guidewire were utilized to select the right uterine artery. Selective right uterine angiogram was performed. This demonstrated patency of the right uterine artery. Catheter position was safe for embolization. Embolization was performed to complete stasis with injection of 2 vials of 500-700 micron Embospheres. Post embolization angiogram confirms complete stasis of the right uterine vascular territory. At this point, all wires, catheters and sheaths were removed from the patient. Hemostasis was achieved at the right groin access site with The patient tolerated the procedure well without immediate post procedural complication. FINDINGS: Conventional branching pattern of the bilateral internal iliac arteries. The bilateral uterine artery origins were noted to be markedly tortuous though lobe both vessels are widely patent supplying a markedly enlarged and myomatous uterus. Completion arteriograms following bilateral uterine artery particle embolization demonstrates a technically excellent result with stasis of flow within the bilateral uterine vascular territories. IMPRESSION: Successful bilateral uterine artery embolization (U F E). Electronically Signed   By: [name] M.D.   On: 08/13/2015 12:33    Micro Results       Recent Results (from the past 240 hour(s))  MRSA PCR Screening     Status: None   Collection Time: 08/11/15  7:03 AM  Result Value Ref Range Status   MRSA by PCR NEGATIVE NEGATIVE Final    Comment:        The GeneXpert MRSA Assay (FDA approved for NASAL specimens only), is one component of a comprehensive MRSA colonization surveillance program. It is not intended to diagnose MRSA infection nor to guide or monitor treatment for MRSA infections.   MRSA PCR Screening     Status: None   Collection Time: 08/13/15  5:13 AM  Result Value Ref Range Status   MRSA by PCR NEGATIVE NEGATIVE Final    Comment:        The GeneXpert MRSA Assay (FDA approved for NASAL specimens only), is one component of a comprehensive MRSA colonization surveillance program. It is not intended to diagnose MRSA infection nor to guide or monitor treatment for MRSA infections.        Today   Subjective    Melinda [name] today has no headache,no chest abdominal pain,no new weakness tingling or numbness, feels much better wants to go home today.     Objective   Blood pressure 146/58, pulse 98, temperature 98 F (36.7 C), temperature source Oral, resp. rate 18, height _0  (1.778 m), weight 136.76 kg (301 lb 8 oz), last menstrual period 08/12/2015, SpO2 98 %.   Intake/Output Summary (Last 24 hours) at 08/14/15 0954 Last data filed at 08/14/15 0715  Gross per 24 hour  Intake      0 ml  Output   2100 ml  Net  -2100 ml    Exam Awake Alert, Oriented x 3, No new F.N deficits, Normal affect [place].AT,PERRAL Supple Neck,No JVD, No cervical lymphadenopathy appriciated.  Symmetrical Chest wall movement, Good air movement bilaterally, CTAB RRR,No Gallops,Rubs or new Murmurs, No Parasternal Heave +ve B.Sounds, Abd Soft, Non tender, No organomegaly appriciated, No rebound -guarding or rigidity. No Cyanosis, Clubbing or edema, No new Rash or bruise   Data Review   CBC  w Diff: Lab Results  Component  Value Date   WBC 9.6 08/14/2015   HGB 9.9* 08/14/2015   HCT 30.2* 08/14/2015   PLT 215 08/14/2015   LYMPHOPCT 21 08/11/2015   MONOPCT 8 08/11/2015   EOSPCT 0 08/11/2015   BASOPCT 0 08/11/2015    CMP: Lab Results  Component Value Date   NA 138 08/14/2015   K 3.7 08/14/2015   CL 110 08/14/2015   CO2 17* 08/14/2015   BUN <5* 08/14/2015   CREATININE 0.64 08/14/2015   PROT 6.0* 08/11/2015   ALBUMIN 3.2* 08/11/2015   BILITOT 0.9 08/11/2015   ALKPHOS 92 08/11/2015   AST 13* 08/11/2015   ALT 16 08/11/2015  .   Total Time in preparing paper work, data evaluation and todays exam - 35 minutes  Thurnell Lose M.D on 08/14/2015 at 9:54 AM  [facility]   Office  [phone]

## 2015-08-14 NOTE — Progress Notes (Signed)
[employer] contacted for transportation

## 2015-08-14 NOTE — Progress Notes (Signed)
Patient gets her meds from [facility], they deliver to her.  They will come over to hospital today to get scripts from [place] the RN and they will fill them and deliver them to her today.  Patient states she will need transportation, RN will notify CSW.

## 2015-08-14 NOTE — Progress Notes (Signed)
Inpatient Diabetes Program Recommendations  AACE/ADA: New Consensus Statement on Inpatient Glycemic Control (2015)  Target Ranges:  Prepandial:   less than 140 mg/dL      Peak postprandial:   less than 180 mg/dL (1-2 hours)      Critically ill patients:  140 - 180 mg/dL   Review of Glycemic Control  Diabetes history: None Current orders for Inpatient glycemic control:  30 units bid and moderate correction tidwc and HS correction scale Has been taught insulin and education ordered. Lantus increased from 25 units bid to 35 units bid.  Once the fasting is less than 150 to 200, basal dose is sufficient. If cbg's prior to lunch and supper remain high, would recommend increase in meal coverage to 5 units tidwc.  Thank you Rosita Kea, RN, MSN, CDE  Diabetes Inpatient Program Office: [phone] Pager: [phone] 8:00 am to 5:00 pm

## 2015-08-14 NOTE — Progress Notes (Signed)
Referring Physician(s): TRH  Chief Complaint:  Symptomatic Uterine fibroid  Subjective:  Bilateral Uterine artery embolization 08/13/15 in IR Has done well overnight Noted decrease in uterine bleeding definitely Some soreness in low back from lying in bed No other complaints    Allergies: Ceftriaxone; Penicillins; Shellfish allergy; and Citrus  Medications: Prior to Admission medications   Medication Sig Start Date End Date Taking? Authorizing Provider  acetaminophen (TYLENOL) 500 MG tablet Take 500-1,000 mg by mouth every 6 (six) hours as needed for moderate pain (patient takes 1-2 depending on pain level).    Yes Historical Provider, MD  albuterol (PROVENTIL HFA;VENTOLIN HFA) 108 (90 BASE) MCG/ACT inhaler Inhale 2 puffs into the lungs every 6 (six) hours as needed for wheezing or shortness of breath. 04/13/15  Yes Josalyn Funches, MD  diphenhydrAMINE (BENADRYL) 25 MG tablet Take 50 mg by mouth at bedtime as needed for sleep (sleep).   Yes Historical Provider, MD  ferrous sulfate 325 (65 FE) MG tablet TAKE ONE (1) TABLET BY MOUTH TWO (2) TIMES DAILY WITH A MEAL 07/18/15  Yes Josalyn Funches, MD  gabapentin (NEURONTIN) 100 MG capsule TAKE 1 CAPSULE BY MOUTH THREE TIMES A DAY 07/18/15  Yes Josalyn Funches, MD  megestrol (MEGACE) 40 MG tablet TAKE ONE (1) TABLET BY MOUTH 3 TIMES DAILY 07/17/15  Yes Peggy Constant, MD  Multiple Vitamins-Minerals (MULTIVITAMIN & MINERAL PO) Take 1 tablet by mouth daily.   Yes Historical Provider, MD  SUMAtriptan (IMITREX) 50 MG tablet Take 1 tablet (50 mg total) by mouth every 2 (two) hours as needed for migraine or headache. May repeat in 2 hours if headache persists or recurs. Do not exceed two doses in 7 day period. 03/15/15  Yes Josalyn Funches, MD  butalbital-acetaminophen-caffeine (FIORICET) 50-325-40 MG tablet Take 1-2 tablets by mouth every 8 (eight) hours as needed for headache. Patient not taking: Reported on 08/10/2015 04/13/15 04/12/16  Boykin Nearing, MD  docusate sodium (COLACE) 100 MG capsule Take 1 capsule (100 mg total) by mouth 2 (two) times daily. 08/14/15   Thurnell Lose, MD  ferrous sulfate 325 (65 FE) MG tablet Take 1 tablet (325 mg total) by mouth 2 (two) times daily with a meal. 08/14/15   Thurnell Lose, MD  glucose monitoring kit (FREESTYLE) monitoring kit 1 each by Does not apply route 4 (four) times daily - after meals and at bedtime. 1 month Diabetic Testing Supplies for QAC-QHS accuchecks.Any brand OK. Diagnosis E11.65 08/11/15   Thurnell Lose, MD  HYDROcodone-acetaminophen (NORCO/VICODIN) 5-325 MG tablet Take 1-2 tablets by mouth every 6 (six) hours as needed for moderate pain. 08/14/15   Thurnell Lose, MD  insulin aspart (NOVOLOG) 100 UNIT/ML injection Before each meal 3 times a day, 140-199 - 2 units, 200-250 - 4 units, 251-299 - 6 units,  300-349 - 8 units,  350 or above 10 units. Dispense syringes and needles as needed, Ok to switch to PEN if approved. Substitute to any brand approved. DX DM2, Code E11.65 08/11/15   Thurnell Lose, MD  insulin glargine (LANTUS) 100 UNIT/ML injection Inject 0.3 mLs (30 Units total) into the skin daily. Dispense insulin pen if approved, if not dispense as needed syringes and needles for 1 month supply. Can switch to Levemir. Diagnosis E 11.65. 08/14/15   Thurnell Lose, MD  Insulin Syringe-Needle U-100 25G X 1" 1 ML MISC For 4 times a day insulin SQ, 1 month supply. Diagnosis E11.65 08/11/15   Thurnell Lose,  MD  Lancets (FREESTYLE) lancets For glucose testing every before meals at bedtime. Diagnosis E 11.65.   Can substitute to any accepted brand 08/11/15   Thurnell Lose, MD  metFORMIN (GLUCOPHAGE) 850 MG tablet Take 1 tablet (850 mg total) by mouth 2 (two) times daily with a meal. 08/16/15   Thurnell Lose, MD  metoprolol (LOPRESSOR) 50 MG tablet Take 1 tablet (50 mg total) by mouth 2 (two) times daily. 08/14/15   Thurnell Lose, MD     Vital Signs: BP 146/58 mmHg   Pulse 98  Temp(Src) 98 F (36.7 C) (Oral)  Resp 18  Ht _0  (1.778 m)  Wt 301 lb 8 oz (136.76 kg)  BMI 43.26 kg/m2  SpO2 98%  LMP 08/12/2015  Physical Exam  Constitutional: She is oriented to person, place, and time. She appears well-nourished.  Cardiovascular: Normal rate and regular rhythm.   Pulmonary/Chest: Effort normal and breath sounds normal.  Abdominal: Soft. Bowel sounds are normal.  Musculoskeletal: Normal range of motion.  Rt foot 2+ pulses Rt groin NT no bleeding No hematoma  Neurological: She is alert and oriented to person, place, and time.  Skin: Skin is warm and dry.  Psychiatric: She has a normal mood and affect.  Nursing note and vitals reviewed.   Imaging: Ct Angio Chest Pe W/cm &/or Wo Cm  08/12/2015  CLINICAL DATA:  Shortness of breath for 2 weeks. Admitted with severe vaginal bleeding. EXAM: CT ANGIOGRAPHY CHEST WITH CONTRAST TECHNIQUE: Multidetector CT imaging of the chest was performed using the standard protocol during bolus administration of intravenous contrast. Multiplanar CT image reconstructions and MIPs were obtained to evaluate the vascular anatomy. CONTRAST:  147m OMNIPAQUE IOHEXOL 350 MG/ML SOLN COMPARISON:  Chest x-ray on 08/10/2005 FINDINGS: The central pulmonary arteries are well opacified. Peripheral branch artery opacification is somewhat limited due to timing of contrast bolus and patient body habitus. There is no evidence of pulmonary embolism. The thoracic aorta is normal in caliber. The heart size is normal. No pleural or pericardial fluid identified. Lungs show no evidence of edema, infiltrate or nodule. Mild scarring/ atelectasis present at both lung bases. Focal followup of the anterior right upper lobe without pneumothorax. No lymphadenopathy identified. In the upper abdomen, there is an incidental mass just superior to the right kidney and likely originating from the right adrenal gland. This lesion measures approximately 5.3 x 4.8 x 4.4  cm and contains areas of macroscopic fat and overall is of low density (-15 to 4 HU) by Hounsfield measurements. There are some partial calcifications anteriorly within the lesion. 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 Review of the MIP images confirms the above findings. IMPRESSION: 1. No evidence of pulmonary embolism or other acute findings in the chest. 2. Incidental 5 cm mass of the right adrenal gland with low density, macroscopic internal fat and areas of anterior calcification. The focal areas of calcification may reflect prior fat necrosis or previous focal hemorrhage. 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 Electronically Signed   By: GAletta EdouardM.D.   On: 08/12/2015 13:41   Ir Angiogram Pelvis Selective Or Supraselective  08/13/2015  INDICATION: Symptomatic uterine fibroids. Patient with persistent uterine daily refractory uterine bleeding. EXAM: 1. SELECTIVE BILATERAL INTERNAL ILIAC ARTERIOGRAM 2. SELECTIVE BILATERAL ANTERIOR DIVISION INTERNAL ILIAC ARTERIOGRAM 3. SELECTIVE BILATERAL UTERINE ARTERY ARTERIOGRAM AND PERCUTANEOUS PARTICLE EMBOLIZATION. 4. ULTRASOUND GUIDANCE FOR ARTERIAL ACCESS MEDICATIONS: Zofran 4 mg IV, Vancomycin 1.5 g IV; The antibiotic was administered  within one hour of the procedure ANESTHESIA/SEDATION: Moderate (conscious) sedation was employed during this procedure. A total of Toradol 30 mg, Versed 6 mg and Fentanyl 300 mcg was administered intravenously. Moderate Sedation Time: 120 minutes. The patient's level of consciousness and vital signs were monitored continuously by radiology nursing throughout the procedure under my direct supervision. CONTRAST:  271m OMNIPAQUE IOHEXOL 300 MG/ML  SOLN FLUOROSCOPY TIME:  38 minutes 24 seconds (43,557mGy) COMPLICATIONS: None immediate. PROCEDURE: Informed consent was obtained from the patient following explanation of the procedure, risks, benefits and alternatives. The patient understands,  agrees and consents for the procedure. All questions were addressed. A time out was performed prior to the initiation of the procedure. Maximal barrier sterile technique utilized including caps, mask, sterile gowns, sterile gloves, large sterile drape, hand hygiene, and Betadine prep. The right femoral head was marked fluoroscopically. Under sterile conditions and local anesthesia, the right common femoral artery access was performed with a micropuncture needle. Under direct ultrasound guidance, the right common femoral was accessed with a micropuncture kit. An ultrasound image was saved for documentation purposes. This allowed for placement of a 5-French vascular sheath. A limited arteriogram was performed through the side arm of the sheath confirming appropriate access within the right common femoral artery. The 5-French C2 catheter was utilized to select the contralateral left internal iliac artery. Selective left internal iliac angiogram was performed. The tortuous left uterine artery was identified. Selective catheterization was performed of the left uterine artery with a microcatheter and micro guide wire. A selective left uterine angiogram was performed. This demonstrated patency of the left uterine artery. Mild diffuse hypervascularity of the enlarged fibroid uterus. Access was adequate for embolization. For embolization, 3 vials of 500 - 700 micron and 2 vials of 700-900 micron Embospheres were injected into the left uterine artery. Post embolization angiogram confirms complete stasis of the left uterine vascular territory. Microcatheter was removed. The C2 catheter was retracted and utilized to select the right internal iliac artery. Selective right internal iliac angiogram was performed. The patent right uterine artery was identified. For selective catheterization, the micro catheter and guidewire were utilized to select the right uterine artery. Selective right uterine angiogram was performed. This  demonstrated patency of the right uterine artery. Catheter position was safe for embolization. Embolization was performed to complete stasis with injection of 2 vials of 500-700 micron Embospheres. Post embolization angiogram confirms complete stasis of the right uterine vascular territory. At this point, all wires, catheters and sheaths were removed from the patient. Hemostasis was achieved at the right groin access site with The patient tolerated the procedure well without immediate post procedural complication. FINDINGS: Conventional branching pattern of the bilateral internal iliac arteries. The bilateral uterine artery origins were noted to be markedly tortuous though lobe both vessels are widely patent supplying a markedly enlarged and myomatous uterus. Completion arteriograms following bilateral uterine artery particle embolization demonstrates a technically excellent result with stasis of flow within the bilateral uterine vascular territories. IMPRESSION: Successful bilateral uterine artery embolization (U F E). Electronically Signed   By: JSandi MariscalM.D.   On: 08/13/2015 12:33   Ir Angiogram Pelvis Selective Or Supraselective  08/13/2015  INDICATION: Symptomatic uterine fibroids. Patient with persistent uterine daily refractory uterine bleeding. EXAM: 1. SELECTIVE BILATERAL INTERNAL ILIAC ARTERIOGRAM 2. SELECTIVE BILATERAL ANTERIOR DIVISION INTERNAL ILIAC ARTERIOGRAM 3. SELECTIVE BILATERAL UTERINE ARTERY ARTERIOGRAM AND PERCUTANEOUS PARTICLE EMBOLIZATION. 4. ULTRASOUND GUIDANCE FOR ARTERIAL ACCESS MEDICATIONS: Zofran 4 mg IV, Vancomycin 1.5 g IV; The antibiotic  was administered within one hour of the procedure ANESTHESIA/SEDATION: Moderate (conscious) sedation was employed during this procedure. A total of Toradol 30 mg, Versed 6 mg and Fentanyl 300 mcg was administered intravenously. Moderate Sedation Time: 120 minutes. The patient's level of consciousness and vital signs were monitored continuously by  radiology nursing throughout the procedure under my direct supervision. CONTRAST:  241m OMNIPAQUE IOHEXOL 300 MG/ML  SOLN FLUOROSCOPY TIME:  38 minutes 24 seconds (48,144mGy) COMPLICATIONS: None immediate. PROCEDURE: Informed consent was obtained from the patient following explanation of the procedure, risks, benefits and alternatives. The patient understands, agrees and consents for the procedure. All questions were addressed. A time out was performed prior to the initiation of the procedure. Maximal barrier sterile technique utilized including caps, mask, sterile gowns, sterile gloves, large sterile drape, hand hygiene, and Betadine prep. The right femoral head was marked fluoroscopically. Under sterile conditions and local anesthesia, the right common femoral artery access was performed with a micropuncture needle. Under direct ultrasound guidance, the right common femoral was accessed with a micropuncture kit. An ultrasound image was saved for documentation purposes. This allowed for placement of a 5-French vascular sheath. A limited arteriogram was performed through the side arm of the sheath confirming appropriate access within the right common femoral artery. The 5-French C2 catheter was utilized to select the contralateral left internal iliac artery. Selective left internal iliac angiogram was performed. The tortuous left uterine artery was identified. Selective catheterization was performed of the left uterine artery with a microcatheter and micro guide wire. A selective left uterine angiogram was performed. This demonstrated patency of the left uterine artery. Mild diffuse hypervascularity of the enlarged fibroid uterus. Access was adequate for embolization. For embolization, 3 vials of 500 - 700 micron and 2 vials of 700-900 micron Embospheres were injected into the left uterine artery. Post embolization angiogram confirms complete stasis of the left uterine vascular territory. Microcatheter was removed.  The C2 catheter was retracted and utilized to select the right internal iliac artery. Selective right internal iliac angiogram was performed. The patent right uterine artery was identified. For selective catheterization, the micro catheter and guidewire were utilized to select the right uterine artery. Selective right uterine angiogram was performed. This demonstrated patency of the right uterine artery. Catheter position was safe for embolization. Embolization was performed to complete stasis with injection of 2 vials of 500-700 micron Embospheres. Post embolization angiogram confirms complete stasis of the right uterine vascular territory. At this point, all wires, catheters and sheaths were removed from the patient. Hemostasis was achieved at the right groin access site with The patient tolerated the procedure well without immediate post procedural complication. FINDINGS: Conventional branching pattern of the bilateral internal iliac arteries. The bilateral uterine artery origins were noted to be markedly tortuous though lobe both vessels are widely patent supplying a markedly enlarged and myomatous uterus. Completion arteriograms following bilateral uterine artery particle embolization demonstrates a technically excellent result with stasis of flow within the bilateral uterine vascular territories. IMPRESSION: Successful bilateral uterine artery embolization (U F E). Electronically Signed   By: JSandi MariscalM.D.   On: 08/13/2015 12:33   Ir Angiogram Selective Each Additional Vessel  08/13/2015  INDICATION: Symptomatic uterine fibroids. Patient with persistent uterine daily refractory uterine bleeding. EXAM: 1. SELECTIVE BILATERAL INTERNAL ILIAC ARTERIOGRAM 2. SELECTIVE BILATERAL ANTERIOR DIVISION INTERNAL ILIAC ARTERIOGRAM 3. SELECTIVE BILATERAL UTERINE ARTERY ARTERIOGRAM AND PERCUTANEOUS PARTICLE EMBOLIZATION. 4. ULTRASOUND GUIDANCE FOR ARTERIAL ACCESS MEDICATIONS: Zofran 4 mg IV, Vancomycin 1.5 g IV;  The  antibiotic was administered within one hour of the procedure ANESTHESIA/SEDATION: Moderate (conscious) sedation was employed during this procedure. A total of Toradol 30 mg, Versed 6 mg and Fentanyl 300 mcg was administered intravenously. Moderate Sedation Time: 120 minutes. The patient's level of consciousness and vital signs were monitored continuously by radiology nursing throughout the procedure under my direct supervision. CONTRAST:  239m OMNIPAQUE IOHEXOL 300 MG/ML  SOLN FLUOROSCOPY TIME:  38 minutes 24 seconds (46,812mGy) COMPLICATIONS: None immediate. PROCEDURE: Informed consent was obtained from the patient following explanation of the procedure, risks, benefits and alternatives. The patient understands, agrees and consents for the procedure. All questions were addressed. A time out was performed prior to the initiation of the procedure. Maximal barrier sterile technique utilized including caps, mask, sterile gowns, sterile gloves, large sterile drape, hand hygiene, and Betadine prep. The right femoral head was marked fluoroscopically. Under sterile conditions and local anesthesia, the right common femoral artery access was performed with a micropuncture needle. Under direct ultrasound guidance, the right common femoral was accessed with a micropuncture kit. An ultrasound image was saved for documentation purposes. This allowed for placement of a 5-French vascular sheath. A limited arteriogram was performed through the side arm of the sheath confirming appropriate access within the right common femoral artery. The 5-French C2 catheter was utilized to select the contralateral left internal iliac artery. Selective left internal iliac angiogram was performed. The tortuous left uterine artery was identified. Selective catheterization was performed of the left uterine artery with a microcatheter and micro guide wire. A selective left uterine angiogram was performed. This demonstrated patency of the left uterine  artery. Mild diffuse hypervascularity of the enlarged fibroid uterus. Access was adequate for embolization. For embolization, 3 vials of 500 - 700 micron and 2 vials of 700-900 micron Embospheres were injected into the left uterine artery. Post embolization angiogram confirms complete stasis of the left uterine vascular territory. Microcatheter was removed. The C2 catheter was retracted and utilized to select the right internal iliac artery. Selective right internal iliac angiogram was performed. The patent right uterine artery was identified. For selective catheterization, the micro catheter and guidewire were utilized to select the right uterine artery. Selective right uterine angiogram was performed. This demonstrated patency of the right uterine artery. Catheter position was safe for embolization. Embolization was performed to complete stasis with injection of 2 vials of 500-700 micron Embospheres. Post embolization angiogram confirms complete stasis of the right uterine vascular territory. At this point, all wires, catheters and sheaths were removed from the patient. Hemostasis was achieved at the right groin access site with The patient tolerated the procedure well without immediate post procedural complication. FINDINGS: Conventional branching pattern of the bilateral internal iliac arteries. The bilateral uterine artery origins were noted to be markedly tortuous though lobe both vessels are widely patent supplying a markedly enlarged and myomatous uterus. Completion arteriograms following bilateral uterine artery particle embolization demonstrates a technically excellent result with stasis of flow within the bilateral uterine vascular territories. IMPRESSION: Successful bilateral uterine artery embolization (U F E). Electronically Signed   By: JSandi MariscalM.D.   On: 08/13/2015 12:33   Ir Angiogram Selective Each Additional Vessel  08/13/2015  INDICATION: Symptomatic uterine fibroids. Patient with  persistent uterine daily refractory uterine bleeding. EXAM: 1. SELECTIVE BILATERAL INTERNAL ILIAC ARTERIOGRAM 2. SELECTIVE BILATERAL ANTERIOR DIVISION INTERNAL ILIAC ARTERIOGRAM 3. SELECTIVE BILATERAL UTERINE ARTERY ARTERIOGRAM AND PERCUTANEOUS PARTICLE EMBOLIZATION. 4. ULTRASOUND GUIDANCE FOR ARTERIAL ACCESS MEDICATIONS: Zofran 4 mg IV, Vancomycin  1.5 g IV; The antibiotic was administered within one hour of the procedure ANESTHESIA/SEDATION: Moderate (conscious) sedation was employed during this procedure. A total of Toradol 30 mg, Versed 6 mg and Fentanyl 300 mcg was administered intravenously. Moderate Sedation Time: 120 minutes. The patient's level of consciousness and vital signs were monitored continuously by radiology nursing throughout the procedure under my direct supervision. CONTRAST:  268m OMNIPAQUE IOHEXOL 300 MG/ML  SOLN FLUOROSCOPY TIME:  38 minutes 24 seconds (46,773mGy) COMPLICATIONS: None immediate. PROCEDURE: Informed consent was obtained from the patient following explanation of the procedure, risks, benefits and alternatives. The patient understands, agrees and consents for the procedure. All questions were addressed. A time out was performed prior to the initiation of the procedure. Maximal barrier sterile technique utilized including caps, mask, sterile gowns, sterile gloves, large sterile drape, hand hygiene, and Betadine prep. The right femoral head was marked fluoroscopically. Under sterile conditions and local anesthesia, the right common femoral artery access was performed with a micropuncture needle. Under direct ultrasound guidance, the right common femoral was accessed with a micropuncture kit. An ultrasound image was saved for documentation purposes. This allowed for placement of a 5-French vascular sheath. A limited arteriogram was performed through the side arm of the sheath confirming appropriate access within the right common femoral artery. The 5-French C2 catheter was utilized  to select the contralateral left internal iliac artery. Selective left internal iliac angiogram was performed. The tortuous left uterine artery was identified. Selective catheterization was performed of the left uterine artery with a microcatheter and micro guide wire. A selective left uterine angiogram was performed. This demonstrated patency of the left uterine artery. Mild diffuse hypervascularity of the enlarged fibroid uterus. Access was adequate for embolization. For embolization, 3 vials of 500 - 700 micron and 2 vials of 700-900 micron Embospheres were injected into the left uterine artery. Post embolization angiogram confirms complete stasis of the left uterine vascular territory. Microcatheter was removed. The C2 catheter was retracted and utilized to select the right internal iliac artery. Selective right internal iliac angiogram was performed. The patent right uterine artery was identified. For selective catheterization, the micro catheter and guidewire were utilized to select the right uterine artery. Selective right uterine angiogram was performed. This demonstrated patency of the right uterine artery. Catheter position was safe for embolization. Embolization was performed to complete stasis with injection of 2 vials of 500-700 micron Embospheres. Post embolization angiogram confirms complete stasis of the right uterine vascular territory. At this point, all wires, catheters and sheaths were removed from the patient. Hemostasis was achieved at the right groin access site with The patient tolerated the procedure well without immediate post procedural complication. FINDINGS: Conventional branching pattern of the bilateral internal iliac arteries. The bilateral uterine artery origins were noted to be markedly tortuous though lobe both vessels are widely patent supplying a markedly enlarged and myomatous uterus. Completion arteriograms following bilateral uterine artery particle embolization demonstrates a  technically excellent result with stasis of flow within the bilateral uterine vascular territories. IMPRESSION: Successful bilateral uterine artery embolization (U F E). Electronically Signed   By: JSandi MariscalM.D.   On: 08/13/2015 12:33   Ir Angiogram Selective Each Additional Vessel  08/13/2015  INDICATION: Symptomatic uterine fibroids. Patient with persistent uterine daily refractory uterine bleeding. EXAM: 1. SELECTIVE BILATERAL INTERNAL ILIAC ARTERIOGRAM 2. SELECTIVE BILATERAL ANTERIOR DIVISION INTERNAL ILIAC ARTERIOGRAM 3. SELECTIVE BILATERAL UTERINE ARTERY ARTERIOGRAM AND PERCUTANEOUS PARTICLE EMBOLIZATION. 4. ULTRASOUND GUIDANCE FOR ARTERIAL ACCESS MEDICATIONS: Zofran 4 mg  IV, Vancomycin 1.5 g IV; The antibiotic was administered within one hour of the procedure ANESTHESIA/SEDATION: Moderate (conscious) sedation was employed during this procedure. A total of Toradol 30 mg, Versed 6 mg and Fentanyl 300 mcg was administered intravenously. Moderate Sedation Time: 120 minutes. The patient's level of consciousness and vital signs were monitored continuously by radiology nursing throughout the procedure under my direct supervision. CONTRAST:  218m OMNIPAQUE IOHEXOL 300 MG/ML  SOLN FLUOROSCOPY TIME:  38 minutes 24 seconds (41,540mGy) COMPLICATIONS: None immediate. PROCEDURE: Informed consent was obtained from the patient following explanation of the procedure, risks, benefits and alternatives. The patient understands, agrees and consents for the procedure. All questions were addressed. A time out was performed prior to the initiation of the procedure. Maximal barrier sterile technique utilized including caps, mask, sterile gowns, sterile gloves, large sterile drape, hand hygiene, and Betadine prep. The right femoral head was marked fluoroscopically. Under sterile conditions and local anesthesia, the right common femoral artery access was performed with a micropuncture needle. Under direct ultrasound guidance,  the right common femoral was accessed with a micropuncture kit. An ultrasound image was saved for documentation purposes. This allowed for placement of a 5-French vascular sheath. A limited arteriogram was performed through the side arm of the sheath confirming appropriate access within the right common femoral artery. The 5-French C2 catheter was utilized to select the contralateral left internal iliac artery. Selective left internal iliac angiogram was performed. The tortuous left uterine artery was identified. Selective catheterization was performed of the left uterine artery with a microcatheter and micro guide wire. A selective left uterine angiogram was performed. This demonstrated patency of the left uterine artery. Mild diffuse hypervascularity of the enlarged fibroid uterus. Access was adequate for embolization. For embolization, 3 vials of 500 - 700 micron and 2 vials of 700-900 micron Embospheres were injected into the left uterine artery. Post embolization angiogram confirms complete stasis of the left uterine vascular territory. Microcatheter was removed. The C2 catheter was retracted and utilized to select the right internal iliac artery. Selective right internal iliac angiogram was performed. The patent right uterine artery was identified. For selective catheterization, the micro catheter and guidewire were utilized to select the right uterine artery. Selective right uterine angiogram was performed. This demonstrated patency of the right uterine artery. Catheter position was safe for embolization. Embolization was performed to complete stasis with injection of 2 vials of 500-700 micron Embospheres. Post embolization angiogram confirms complete stasis of the right uterine vascular territory. At this point, all wires, catheters and sheaths were removed from the patient. Hemostasis was achieved at the right groin access site with The patient tolerated the procedure well without immediate post procedural  complication. FINDINGS: Conventional branching pattern of the bilateral internal iliac arteries. The bilateral uterine artery origins were noted to be markedly tortuous though lobe both vessels are widely patent supplying a markedly enlarged and myomatous uterus. Completion arteriograms following bilateral uterine artery particle embolization demonstrates a technically excellent result with stasis of flow within the bilateral uterine vascular territories. IMPRESSION: Successful bilateral uterine artery embolization (U F E). Electronically Signed   By: JSandi MariscalM.D.   On: 08/13/2015 12:33   Ir Angiogram Selective Each Additional Vessel  08/13/2015  INDICATION: Symptomatic uterine fibroids. Patient with persistent uterine daily refractory uterine bleeding. EXAM: 1. SELECTIVE BILATERAL INTERNAL ILIAC ARTERIOGRAM 2. SELECTIVE BILATERAL ANTERIOR DIVISION INTERNAL ILIAC ARTERIOGRAM 3. SELECTIVE BILATERAL UTERINE ARTERY ARTERIOGRAM AND PERCUTANEOUS PARTICLE EMBOLIZATION. 4. ULTRASOUND GUIDANCE FOR ARTERIAL ACCESS MEDICATIONS: Zofran  4 mg IV, Vancomycin 1.5 g IV; The antibiotic was administered within one hour of the procedure ANESTHESIA/SEDATION: Moderate (conscious) sedation was employed during this procedure. A total of Toradol 30 mg, Versed 6 mg and Fentanyl 300 mcg was administered intravenously. Moderate Sedation Time: 120 minutes. The patient's level of consciousness and vital signs were monitored continuously by radiology nursing throughout the procedure under my direct supervision. CONTRAST:  273m OMNIPAQUE IOHEXOL 300 MG/ML  SOLN FLUOROSCOPY TIME:  38 minutes 24 seconds (40,962mGy) COMPLICATIONS: None immediate. PROCEDURE: Informed consent was obtained from the patient following explanation of the procedure, risks, benefits and alternatives. The patient understands, agrees and consents for the procedure. All questions were addressed. A time out was performed prior to the initiation of the procedure. Maximal  barrier sterile technique utilized including caps, mask, sterile gowns, sterile gloves, large sterile drape, hand hygiene, and Betadine prep. The right femoral head was marked fluoroscopically. Under sterile conditions and local anesthesia, the right common femoral artery access was performed with a micropuncture needle. Under direct ultrasound guidance, the right common femoral was accessed with a micropuncture kit. An ultrasound image was saved for documentation purposes. This allowed for placement of a 5-French vascular sheath. A limited arteriogram was performed through the side arm of the sheath confirming appropriate access within the right common femoral artery. The 5-French C2 catheter was utilized to select the contralateral left internal iliac artery. Selective left internal iliac angiogram was performed. The tortuous left uterine artery was identified. Selective catheterization was performed of the left uterine artery with a microcatheter and micro guide wire. A selective left uterine angiogram was performed. This demonstrated patency of the left uterine artery. Mild diffuse hypervascularity of the enlarged fibroid uterus. Access was adequate for embolization. For embolization, 3 vials of 500 - 700 micron and 2 vials of 700-900 micron Embospheres were injected into the left uterine artery. Post embolization angiogram confirms complete stasis of the left uterine vascular territory. Microcatheter was removed. The C2 catheter was retracted and utilized to select the right internal iliac artery. Selective right internal iliac angiogram was performed. The patent right uterine artery was identified. For selective catheterization, the micro catheter and guidewire were utilized to select the right uterine artery. Selective right uterine angiogram was performed. This demonstrated patency of the right uterine artery. Catheter position was safe for embolization. Embolization was performed to complete stasis with  injection of 2 vials of 500-700 micron Embospheres. Post embolization angiogram confirms complete stasis of the right uterine vascular territory. At this point, all wires, catheters and sheaths were removed from the patient. Hemostasis was achieved at the right groin access site with The patient tolerated the procedure well without immediate post procedural complication. FINDINGS: Conventional branching pattern of the bilateral internal iliac arteries. The bilateral uterine artery origins were noted to be markedly tortuous though lobe both vessels are widely patent supplying a markedly enlarged and myomatous uterus. Completion arteriograms following bilateral uterine artery particle embolization demonstrates a technically excellent result with stasis of flow within the bilateral uterine vascular territories. IMPRESSION: Successful bilateral uterine artery embolization (U F E). Electronically Signed   By: JSandi MariscalM.D.   On: 08/13/2015 12:33   Ir Angiogram Follow Up Study  08/13/2015  INDICATION: Symptomatic uterine fibroids. Patient with persistent uterine daily refractory uterine bleeding. EXAM: 1. SELECTIVE BILATERAL INTERNAL ILIAC ARTERIOGRAM 2. SELECTIVE BILATERAL ANTERIOR DIVISION INTERNAL ILIAC ARTERIOGRAM 3. SELECTIVE BILATERAL UTERINE ARTERY ARTERIOGRAM AND PERCUTANEOUS PARTICLE EMBOLIZATION. 4. ULTRASOUND GUIDANCE FOR ARTERIAL ACCESS MEDICATIONS:  Zofran 4 mg IV, Vancomycin 1.5 g IV; The antibiotic was administered within one hour of the procedure ANESTHESIA/SEDATION: Moderate (conscious) sedation was employed during this procedure. A total of Toradol 30 mg, Versed 6 mg and Fentanyl 300 mcg was administered intravenously. Moderate Sedation Time: 120 minutes. The patient's level of consciousness and vital signs were monitored continuously by radiology nursing throughout the procedure under my direct supervision. CONTRAST:  247m OMNIPAQUE IOHEXOL 300 MG/ML  SOLN FLUOROSCOPY TIME:  38 minutes 24 seconds  (46,962mGy) COMPLICATIONS: None immediate. PROCEDURE: Informed consent was obtained from the patient following explanation of the procedure, risks, benefits and alternatives. The patient understands, agrees and consents for the procedure. All questions were addressed. A time out was performed prior to the initiation of the procedure. Maximal barrier sterile technique utilized including caps, mask, sterile gowns, sterile gloves, large sterile drape, hand hygiene, and Betadine prep. The right femoral head was marked fluoroscopically. Under sterile conditions and local anesthesia, the right common femoral artery access was performed with a micropuncture needle. Under direct ultrasound guidance, the right common femoral was accessed with a micropuncture kit. An ultrasound image was saved for documentation purposes. This allowed for placement of a 5-French vascular sheath. A limited arteriogram was performed through the side arm of the sheath confirming appropriate access within the right common femoral artery. The 5-French C2 catheter was utilized to select the contralateral left internal iliac artery. Selective left internal iliac angiogram was performed. The tortuous left uterine artery was identified. Selective catheterization was performed of the left uterine artery with a microcatheter and micro guide wire. A selective left uterine angiogram was performed. This demonstrated patency of the left uterine artery. Mild diffuse hypervascularity of the enlarged fibroid uterus. Access was adequate for embolization. For embolization, 3 vials of 500 - 700 micron and 2 vials of 700-900 micron Embospheres were injected into the left uterine artery. Post embolization angiogram confirms complete stasis of the left uterine vascular territory. Microcatheter was removed. The C2 catheter was retracted and utilized to select the right internal iliac artery. Selective right internal iliac angiogram was performed. The patent right  uterine artery was identified. For selective catheterization, the micro catheter and guidewire were utilized to select the right uterine artery. Selective right uterine angiogram was performed. This demonstrated patency of the right uterine artery. Catheter position was safe for embolization. Embolization was performed to complete stasis with injection of 2 vials of 500-700 micron Embospheres. Post embolization angiogram confirms complete stasis of the right uterine vascular territory. At this point, all wires, catheters and sheaths were removed from the patient. Hemostasis was achieved at the right groin access site with The patient tolerated the procedure well without immediate post procedural complication. FINDINGS: Conventional branching pattern of the bilateral internal iliac arteries. The bilateral uterine artery origins were noted to be markedly tortuous though lobe both vessels are widely patent supplying a markedly enlarged and myomatous uterus. Completion arteriograms following bilateral uterine artery particle embolization demonstrates a technically excellent result with stasis of flow within the bilateral uterine vascular territories. IMPRESSION: Successful bilateral uterine artery embolization (U F E). Electronically Signed   By: JSandi MariscalM.D.   On: 08/13/2015 12:33   Ir [place] Vasc Access Right  08/13/2015  INDICATION: Symptomatic uterine fibroids. Patient with persistent uterine daily refractory uterine bleeding. EXAM: 1. SELECTIVE BILATERAL INTERNAL ILIAC ARTERIOGRAM 2. SELECTIVE BILATERAL ANTERIOR DIVISION INTERNAL ILIAC ARTERIOGRAM 3. SELECTIVE BILATERAL UTERINE ARTERY ARTERIOGRAM AND PERCUTANEOUS PARTICLE EMBOLIZATION. 4. ULTRASOUND GUIDANCE FOR ARTERIAL  ACCESS MEDICATIONS: Zofran 4 mg IV, Vancomycin 1.5 g IV; The antibiotic was administered within one hour of the procedure ANESTHESIA/SEDATION: Moderate (conscious) sedation was employed during this procedure. A total of Toradol 30 mg,  Versed 6 mg and Fentanyl 300 mcg was administered intravenously. Moderate Sedation Time: 120 minutes. The patient's level of consciousness and vital signs were monitored continuously by radiology nursing throughout the procedure under my direct supervision. CONTRAST:  253m OMNIPAQUE IOHEXOL 300 MG/ML  SOLN FLUOROSCOPY TIME:  38 minutes 24 seconds (48,099mGy) COMPLICATIONS: None immediate. PROCEDURE: Informed consent was obtained from the patient following explanation of the procedure, risks, benefits and alternatives. The patient understands, agrees and consents for the procedure. All questions were addressed. A time out was performed prior to the initiation of the procedure. Maximal barrier sterile technique utilized including caps, mask, sterile gowns, sterile gloves, large sterile drape, hand hygiene, and Betadine prep. The right femoral head was marked fluoroscopically. Under sterile conditions and local anesthesia, the right common femoral artery access was performed with a micropuncture needle. Under direct ultrasound guidance, the right common femoral was accessed with a micropuncture kit. An ultrasound image was saved for documentation purposes. This allowed for placement of a 5-French vascular sheath. A limited arteriogram was performed through the side arm of the sheath confirming appropriate access within the right common femoral artery. The 5-French C2 catheter was utilized to select the contralateral left internal iliac artery. Selective left internal iliac angiogram was performed. The tortuous left uterine artery was identified. Selective catheterization was performed of the left uterine artery with a microcatheter and micro guide wire. A selective left uterine angiogram was performed. This demonstrated patency of the left uterine artery. Mild diffuse hypervascularity of the enlarged fibroid uterus. Access was adequate for embolization. For embolization, 3 vials of 500 - 700 micron and 2 vials of  700-900 micron Embospheres were injected into the left uterine artery. Post embolization angiogram confirms complete stasis of the left uterine vascular territory. Microcatheter was removed. The C2 catheter was retracted and utilized to select the right internal iliac artery. Selective right internal iliac angiogram was performed. The patent right uterine artery was identified. For selective catheterization, the micro catheter and guidewire were utilized to select the right uterine artery. Selective right uterine angiogram was performed. This demonstrated patency of the right uterine artery. Catheter position was safe for embolization. Embolization was performed to complete stasis with injection of 2 vials of 500-700 micron Embospheres. Post embolization angiogram confirms complete stasis of the right uterine vascular territory. At this point, all wires, catheters and sheaths were removed from the patient. Hemostasis was achieved at the right groin access site with The patient tolerated the procedure well without immediate post procedural complication. FINDINGS: Conventional branching pattern of the bilateral internal iliac arteries. The bilateral uterine artery origins were noted to be markedly tortuous though lobe both vessels are widely patent supplying a markedly enlarged and myomatous uterus. Completion arteriograms following bilateral uterine artery particle embolization demonstrates a technically excellent result with stasis of flow within the bilateral uterine vascular territories. IMPRESSION: Successful bilateral uterine artery embolization (U F E). Electronically Signed   By: JSandi MariscalM.D.   On: 08/13/2015 12:33   Dg Chest Port 1 View  08/11/2015  CLINICAL DATA:  Shortness of breath. EXAM: PORTABLE CHEST 1 VIEW COMPARISON:  01/16/2015 FINDINGS: Mild linear scarring in the right lower lobe. Lungs appear otherwise clear. Cardiac and mediastinal margins appear normal. No pleural effusion identified.  IMPRESSION: 1.  No active cardiopulmonary disease is radiographically apparent. 2. Chronic scarring in the right lower lobe. Electronically Signed   By: Van Clines M.D.   On: 08/11/2015 08:03   [place] Guide [employer]  08/13/2015  INDICATION: Symptomatic uterine fibroids. Patient with persistent uterine daily refractory uterine bleeding. EXAM: 1. SELECTIVE BILATERAL INTERNAL ILIAC ARTERIOGRAM 2. SELECTIVE BILATERAL ANTERIOR DIVISION INTERNAL ILIAC ARTERIOGRAM 3. SELECTIVE BILATERAL UTERINE ARTERY ARTERIOGRAM AND PERCUTANEOUS PARTICLE EMBOLIZATION. 4. ULTRASOUND GUIDANCE FOR ARTERIAL ACCESS MEDICATIONS: Zofran 4 mg IV, Vancomycin 1.5 g IV; The antibiotic was administered within one hour of the procedure ANESTHESIA/SEDATION: Moderate (conscious) sedation was employed during this procedure. A total of Toradol 30 mg, Versed 6 mg and Fentanyl 300 mcg was administered intravenously. Moderate Sedation Time: 120 minutes. The patient's level of consciousness and vital signs were monitored continuously by radiology nursing throughout the procedure under my direct supervision. CONTRAST:  258m OMNIPAQUE IOHEXOL 300 MG/ML  SOLN FLUOROSCOPY TIME:  38 minutes 24 seconds (40,272mGy) COMPLICATIONS: None immediate. PROCEDURE: Informed consent was obtained from the patient following explanation of the procedure, risks, benefits and alternatives. The patient understands, agrees and consents for the procedure. All questions were addressed. A time out was performed prior to the initiation of the procedure. Maximal barrier sterile technique utilized including caps, mask, sterile gowns, sterile gloves, large sterile drape, hand hygiene, and Betadine prep. The right femoral head was marked fluoroscopically. Under sterile conditions and local anesthesia, the right common femoral artery access was performed with a micropuncture needle. Under direct ultrasound guidance, the right common femoral  was accessed with a micropuncture kit. An ultrasound image was saved for documentation purposes. This allowed for placement of a 5-French vascular sheath. A limited arteriogram was performed through the side arm of the sheath confirming appropriate access within the right common femoral artery. The 5-French C2 catheter was utilized to select the contralateral left internal iliac artery. Selective left internal iliac angiogram was performed. The tortuous left uterine artery was identified. Selective catheterization was performed of the left uterine artery with a microcatheter and micro guide wire. A selective left uterine angiogram was performed. This demonstrated patency of the left uterine artery. Mild diffuse hypervascularity of the enlarged fibroid uterus. Access was adequate for embolization. For embolization, 3 vials of 500 - 700 micron and 2 vials of 700-900 micron Embospheres were injected into the left uterine artery. Post embolization angiogram confirms complete stasis of the left uterine vascular territory. Microcatheter was removed. The C2 catheter was retracted and utilized to select the right internal iliac artery. Selective right internal iliac angiogram was performed. The patent right uterine artery was identified. For selective catheterization, the micro catheter and guidewire were utilized to select the right uterine artery. Selective right uterine angiogram was performed. This demonstrated patency of the right uterine artery. Catheter position was safe for embolization. Embolization was performed to complete stasis with injection of 2 vials of 500-700 micron Embospheres. Post embolization angiogram confirms complete stasis of the right uterine vascular territory. At this point, all wires, catheters and sheaths were removed from the patient. Hemostasis was achieved at the right groin access site with The patient tolerated the procedure well without immediate post procedural complication. FINDINGS:  Conventional branching pattern of the bilateral internal iliac arteries. The bilateral uterine artery origins were noted to be markedly tortuous though lobe both vessels are widely patent supplying a markedly enlarged and myomatous uterus. Completion arteriograms following bilateral uterine artery particle embolization demonstrates  a technically excellent result with stasis of flow within the bilateral uterine vascular territories. IMPRESSION: Successful bilateral uterine artery embolization (U F E). Electronically Signed   By: [name] M.D.   On: 08/13/2015 12:33    Labs:  CBC:  Recent Labs  08/10/15 1721 08/11/15 0352 08/12/15 0935 08/13/15 0357 08/14/15 0607  WBC 12.5* 13.9* 15.9*  --  9.6  HGB 14.0 12.1 10.8* 10.0* 9.9*  HCT 42.4 36.4 33.2* 30.9* 30.2*  PLT 335 297 248  --  215    COAGS:  Recent Labs  08/13/15 0357  INR 1.27    BMP:  Recent Labs  08/11/15 0352 08/11/15 1124 08/12/15 0757 08/14/15 0607  NA 140 140 136 138  K 3.9 3.9 3.7 3.7  CL 112* 113* 106 110  CO2 16* 16* 14* 17*  GLUCOSE 230* 172* 268* 274*  BUN 6 7 <5* <5*  CALCIUM 8.9 8.7* 8.8* 8.9  CREATININE 0.88 0.76 0.83 0.64  GFRNONAA >60 >60 >60 >60  GFRAA >60 >60 >60 >60    LIVER FUNCTION TESTS:  Recent Labs  09/07/14 2025 09/08/14 0147 11/09/14 0310 08/11/15 0352  BILITOT 0.4 0.5 1.0 0.9  AST 12 14 57* 13*  ALT 10 11 11* 16  ALKPHOS 73 73 75 92  PROT 6.9 7.2 6.1* 6.0*  ALBUMIN 3.7 4.0 3.1* 3.2*    Assessment and Plan:  B [place] 2/12 in IR Doing well For 2-4 week follow up in IR clinic She will hear from scheduler for appt date and time Pt aware and agreeable  Electronically Signed: Andria Head A 08/14/2015, 9:37 AM   I spent a total of 15 Minutes at the the patient's bedside AND on the patient's hospital floor or unit, greater than 50% of which was counseling/coordinating care for B [place]

## 2015-08-14 NOTE — Progress Notes (Signed)
Wasted 20ml of 1mg /ml hydromorphone syringe for PCA drip in sink. Witnessed by Ramonita Lab RN

## 2015-08-15 ENCOUNTER — Other Ambulatory Visit: Payer: Self-pay | Admitting: Interventional Radiology

## 2015-08-15 DIAGNOSIS — D259 Leiomyoma of uterus, unspecified: Secondary | ICD-10-CM

## 2015-08-18 ENCOUNTER — Emergency Department (HOSPITAL_COMMUNITY): Payer: Medicaid Other

## 2015-08-18 ENCOUNTER — Encounter (HOSPITAL_COMMUNITY): Payer: Self-pay

## 2015-08-18 ENCOUNTER — Inpatient Hospital Stay (HOSPITAL_COMMUNITY)
Admission: EM | Admit: 2015-08-18 | Discharge: 2015-08-21 | DRG: 872 | Disposition: A | Discharge status: Home / Self Care | Payer: Medicaid Other | Attending: Internal Medicine | Admitting: Internal Medicine

## 2015-08-18 DIAGNOSIS — Z6841 Body Mass Index (BMI) 40.0 and over, adult: Secondary | ICD-10-CM | POA: Diagnosis not present

## 2015-08-18 DIAGNOSIS — D500 Iron deficiency anemia secondary to blood loss (chronic): Secondary | ICD-10-CM | POA: Diagnosis present

## 2015-08-18 DIAGNOSIS — Z87891 Personal history of nicotine dependence: Secondary | ICD-10-CM | POA: Diagnosis not present

## 2015-08-18 DIAGNOSIS — N938 Other specified abnormal uterine and vaginal bleeding: Secondary | ICD-10-CM | POA: Diagnosis present

## 2015-08-18 DIAGNOSIS — Z880 Allergy status to penicillin: Secondary | ICD-10-CM

## 2015-08-18 DIAGNOSIS — J4530 Mild persistent asthma, uncomplicated: Secondary | ICD-10-CM | POA: Diagnosis not present

## 2015-08-18 DIAGNOSIS — E1165 Type 2 diabetes mellitus with hyperglycemia: Secondary | ICD-10-CM | POA: Diagnosis present

## 2015-08-18 DIAGNOSIS — Z811 Family history of alcohol abuse and dependence: Secondary | ICD-10-CM

## 2015-08-18 DIAGNOSIS — F909 Attention-deficit hyperactivity disorder, unspecified type: Secondary | ICD-10-CM | POA: Diagnosis present

## 2015-08-18 DIAGNOSIS — Z9889 Other specified postprocedural states: Secondary | ICD-10-CM

## 2015-08-18 DIAGNOSIS — N719 Inflammatory disease of uterus, unspecified: Secondary | ICD-10-CM | POA: Diagnosis present

## 2015-08-18 DIAGNOSIS — Z91018 Allergy to other foods: Secondary | ICD-10-CM | POA: Diagnosis not present

## 2015-08-18 DIAGNOSIS — E876 Hypokalemia: Secondary | ICD-10-CM | POA: Diagnosis present

## 2015-08-18 DIAGNOSIS — A419 Sepsis, unspecified organism: Secondary | ICD-10-CM | POA: Diagnosis present

## 2015-08-18 DIAGNOSIS — F4310 Post-traumatic stress disorder, unspecified: Secondary | ICD-10-CM | POA: Diagnosis present

## 2015-08-18 DIAGNOSIS — Z8249 Family history of ischemic heart disease and other diseases of the circulatory system: Secondary | ICD-10-CM

## 2015-08-18 DIAGNOSIS — M419 Scoliosis, unspecified: Secondary | ICD-10-CM | POA: Diagnosis present

## 2015-08-18 DIAGNOSIS — J4540 Moderate persistent asthma, uncomplicated: Secondary | ICD-10-CM | POA: Diagnosis present

## 2015-08-18 DIAGNOSIS — Z803 Family history of malignant neoplasm of breast: Secondary | ICD-10-CM

## 2015-08-18 DIAGNOSIS — Z833 Family history of diabetes mellitus: Secondary | ICD-10-CM

## 2015-08-18 DIAGNOSIS — Z86711 Personal history of pulmonary embolism: Secondary | ICD-10-CM | POA: Diagnosis not present

## 2015-08-18 DIAGNOSIS — R109 Unspecified abdominal pain: Secondary | ICD-10-CM | POA: Diagnosis not present

## 2015-08-18 DIAGNOSIS — Z91013 Allergy to seafood: Secondary | ICD-10-CM | POA: Diagnosis not present

## 2015-08-18 DIAGNOSIS — Z794 Long term (current) use of insulin: Secondary | ICD-10-CM

## 2015-08-18 DIAGNOSIS — N939 Abnormal uterine and vaginal bleeding, unspecified: Secondary | ICD-10-CM | POA: Diagnosis present

## 2015-08-18 DIAGNOSIS — R9431 Abnormal electrocardiogram [ECG] [EKG]: Secondary | ICD-10-CM | POA: Diagnosis present

## 2015-08-18 DIAGNOSIS — IMO0001 Reserved for inherently not codable concepts without codable children: Secondary | ICD-10-CM

## 2015-08-18 DIAGNOSIS — E119 Type 2 diabetes mellitus without complications: Secondary | ICD-10-CM

## 2015-08-18 DIAGNOSIS — K219 Gastro-esophageal reflux disease without esophagitis: Secondary | ICD-10-CM | POA: Diagnosis present

## 2015-08-18 DIAGNOSIS — N920 Excessive and frequent menstruation with regular cycle: Secondary | ICD-10-CM | POA: Diagnosis present

## 2015-08-18 DIAGNOSIS — R000 Tachycardia, unspecified: Secondary | ICD-10-CM | POA: Diagnosis present

## 2015-08-18 LAB — I-STAT VENOUS BLOOD GAS, ED
ACID-BASE EXCESS: 6 mmol/L — AB (ref 0.0–2.0)
Bicarbonate: 29.9 mEq/L — ABNORMAL HIGH (ref 20.0–24.0)
O2 Saturation: 90 %
PH VEN: 7.488 — AB (ref 7.250–7.300)
TCO2: 31 mmol/L (ref 0–100)
pCO2, Ven: 39.5 mmHg — ABNORMAL LOW (ref 45.0–50.0)
pO2, Ven: 54 mmHg — ABNORMAL HIGH (ref 30.0–45.0)

## 2015-08-18 LAB — URINALYSIS, ROUTINE W REFLEX MICROSCOPIC
Glucose, UA: >1000 mg/dL — AB
Ketones, ur: >80 mg/dL — AB
LEUKOCYTES UA: NEGATIVE
NITRITE: NEGATIVE
PH: 6.5 (ref 5.0–8.0)
Protein, ur: 30 mg/dL — AB
SPECIFIC GRAVITY, URINE: 1.026 (ref 1.005–1.030)

## 2015-08-18 LAB — I-STAT CHEM 8, ED
BUN: <3 mg/dL — ABNORMAL LOW (ref 6–20)
CALCIUM ION: 1.01 mmol/L — AB (ref 1.12–1.23)
Chloride: 97 mmol/L — ABNORMAL LOW (ref 101–111)
Creatinine, Ser: 0.5 mg/dL (ref 0.44–1.00)
GLUCOSE: 270 mg/dL — AB (ref 65–99)
HCT: 32 % — ABNORMAL LOW (ref 36.0–46.0)
HEMOGLOBIN: 10.9 g/dL — AB (ref 12.0–15.0)
Potassium: 2.6 mmol/L — CL (ref 3.5–5.1)
Sodium: 141 mmol/L (ref 135–145)
TCO2: 28 mmol/L (ref 0–100)

## 2015-08-18 LAB — URINE MICROSCOPIC-ADD ON

## 2015-08-18 LAB — CBC WITH DIFFERENTIAL/PLATELET
BASOS ABS: 0 10*3/uL (ref 0.0–0.1)
BASOS PCT: 0 %
EOS ABS: 0 10*3/uL (ref 0.0–0.7)
Eosinophils Relative: 0 %
HEMATOCRIT: 30.9 % — AB (ref 36.0–46.0)
Hemoglobin: 9.8 g/dL — ABNORMAL LOW (ref 12.0–15.0)
Lymphocytes Relative: 14 %
Lymphs Abs: 1.4 10*3/uL (ref 0.7–4.0)
MCH: 26 pg (ref 26.0–34.0)
MCHC: 31.7 g/dL (ref 30.0–36.0)
MCV: 82 fL (ref 78.0–100.0)
MONO ABS: 1.4 10*3/uL — AB (ref 0.1–1.0)
Monocytes Relative: 14 %
NEUTROS ABS: 7.7 10*3/uL (ref 1.7–7.7)
NEUTROS PCT: 72 %
PLATELETS: 368 10*3/uL (ref 150–400)
RBC: 3.77 MIL/uL — ABNORMAL LOW (ref 3.87–5.11)
RDW: 17.4 % — AB (ref 11.5–15.5)
WBC: 10.7 10*3/uL — ABNORMAL HIGH (ref 4.0–10.5)

## 2015-08-18 LAB — POC URINE PREG, ED: Preg Test, Ur: NEGATIVE

## 2015-08-18 LAB — COMPREHENSIVE METABOLIC PANEL
ALT: 11 U/L — AB (ref 14–54)
AST: 10 U/L — AB (ref 15–41)
Albumin: 2.3 g/dL — ABNORMAL LOW (ref 3.5–5.0)
Alkaline Phosphatase: 71 U/L (ref 38–126)
Anion gap: 13 (ref 5–15)
CHLORIDE: 102 mmol/L (ref 101–111)
CO2: 27 mmol/L (ref 22–32)
CREATININE: 0.66 mg/dL (ref 0.44–1.00)
Calcium: 8 mg/dL — ABNORMAL LOW (ref 8.9–10.3)
GFR calc Af Amer: >60 mL/min (ref >60)
GLUCOSE: 266 mg/dL — AB (ref 65–99)
Potassium: 2.5 mmol/L — CL (ref 3.5–5.1)
Sodium: 142 mmol/L (ref 135–145)
Total Bilirubin: 0.9 mg/dL (ref 0.3–1.2)
Total Protein: 5.5 g/dL — ABNORMAL LOW (ref 6.5–8.1)

## 2015-08-18 LAB — LIPASE, BLOOD: LIPASE: 24 U/L (ref 11–51)

## 2015-08-18 LAB — I-STAT CG4 LACTIC ACID, ED: LACTIC ACID, VENOUS: 1.03 mmol/L (ref 0.5–2.0)

## 2015-08-18 MED ORDER — POTASSIUM CHLORIDE 10 MEQ/100ML IV SOLN
10.0000 meq | INTRAVENOUS | Status: AC
  Administered 2015-08-18 (×3): 10 meq via INTRAVENOUS
  Filled 2015-08-18 (×3): qty 100

## 2015-08-18 MED ORDER — ONDANSETRON HCL 4 MG/2ML IJ SOLN
4.0000 mg | Freq: Once | INTRAMUSCULAR | Status: AC
  Administered 2015-08-18: 4 mg via INTRAVENOUS
  Filled 2015-08-18: qty 2

## 2015-08-18 MED ORDER — HYDROMORPHONE HCL 1 MG/ML IJ SOLN
1.0000 mg | Freq: Once | INTRAMUSCULAR | Status: AC
  Administered 2015-08-18: 1 mg via INTRAVENOUS
  Filled 2015-08-18: qty 1

## 2015-08-18 MED ORDER — MORPHINE SULFATE (PF) 2 MG/ML IV SOLN: 2.0000 mg | Freq: Once | INTRAVENOUS | Status: DC

## 2015-08-18 MED ORDER — POTASSIUM CHLORIDE CRYS ER 20 MEQ PO TBCR
40.0000 meq | EXTENDED_RELEASE_TABLET | Freq: Once | ORAL | Status: AC
  Administered 2015-08-18: 40 meq via ORAL
  Filled 2015-08-18: qty 2

## 2015-08-18 MED ORDER — IOHEXOL 300 MG/ML  SOLN
100.0000 mL | Freq: Once | INTRAMUSCULAR | Status: AC | PRN
  Administered 2015-08-18: 100 mL via INTRAVENOUS

## 2015-08-18 MED ORDER — SODIUM CHLORIDE 0.9 % IV BOLUS (SEPSIS)
1000.0000 mL | Freq: Once | INTRAVENOUS | Status: AC
  Administered 2015-08-18: 1000 mL via INTRAVENOUS

## 2015-08-18 MED ORDER — GENTAMICIN SULFATE 40 MG/ML IJ SOLN
1.5000 mg/kg | Freq: Once | INTRAVENOUS | Status: AC
  Administered 2015-08-19: 100 mg via INTRAVENOUS
  Filled 2015-08-18: qty 2.5

## 2015-08-18 MED ORDER — CLINDAMYCIN PHOSPHATE 900 MG/50ML IV SOLN
900.0000 mg | Freq: Once | INTRAVENOUS | Status: AC
  Administered 2015-08-18: 900 mg via INTRAVENOUS
  Filled 2015-08-18: qty 50

## 2015-08-18 NOTE — ED Notes (Signed)
Pt brought in EMS for diffuse abdominal pain.  Pt had uterine fibroids removed on Saturday and was sent home with Hydrocodone.  Pt reports the hydrocodone is ineffective.  Pt has not contacted surgeon.

## 2015-08-18 NOTE — ED Provider Notes (Signed)
CSN: [id number]     Arrival date & time 08/18/15  1705 History   First MD Initiated Contact with Patient 08/18/15 1714     Chief Complaint  Patient presents with  . Abdominal Pain     (Consider location/radiation/quality/duration/timing/severity/associated sxs/prior Treatment) Patient is a 50 y.o. female presenting with abdominal pain.  Abdominal Pain Pain location:  Suprapubic, epigastric, R flank and L flank Pain quality: aching and cramping   Pain radiates to:  Back Pain severity:  Moderate Onset quality:  Gradual Duration:  5 hours Timing:  Constant Progression:  Worsening Chronicity:  New Context: previous surgery (fibroid ablation)   Context: not alcohol use, not recent sexual activity, not suspicious food intake and not trauma   Context comment:  New onset DM on 2/9 discharged with insulin, but has not taken it because she cannot "read the label " due to her "blurry vision." Relieved by:  Nothing Worsened by:  Nothing tried Associated symptoms: nausea and vaginal discharge   Associated symptoms: no chest pain, no chills, no cough, no diarrhea, no dysuria, no fatigue, no fever, no shortness of breath, no sore throat and no vomiting   Risk factors: obesity     Past Medical History  Diagnosis Date  . Migraine   . PTSD (post-traumatic stress disorder)   . Asthma   . GERD (gastroesophageal reflux disease)   . Scoliosis   . Arthritis   . ADHD (attention deficit hyperactivity disorder)   . Peripheral neuropathy ([place])   . Iron deficiency anemia due to chronic blood loss 09/09/2014  . Hyperglycemia 09/09/2014  . Low TSH level 09/08/2014  . Menorrhagia 09/08/2014  . Ovarian mass, right 09/09/2014  . PE (pulmonary embolism)   . Lumbar herniated disc   . Multiple thyroid nodules    Past Surgical History  Procedure Laterality Date  . Cholecystectomy     Family History  Problem Relation Age of Onset  . Diabetes Mother   . Breast cancer Mother   . CAD Mother   .  Hypertension Mother   . Alcohol abuse Father   . Hypertension Father    Social History  Substance Use Topics  . Smoking status: Former Smoker -- 0.50 packs/day for 40 years    Types: Cigarettes    Quit date: 11/03/2014  . Smokeless tobacco: Never Used  . Alcohol Use: No   OB History    Gravida Para Term Preterm AB TAB SAB Ectopic Multiple Living   _0 Review of Systems  Constitutional: Negative for fever, chills, appetite change and fatigue.  HENT: Negative for congestion, ear pain, facial swelling, mouth sores and sore throat.   Eyes: Negative for visual disturbance.  Respiratory: Negative for cough, chest tightness and shortness of breath.   Cardiovascular: Negative for chest pain and palpitations.  Gastrointestinal: Positive for nausea and abdominal pain. Negative for vomiting, diarrhea and blood in stool.  Endocrine: Negative for cold intolerance and heat intolerance.  Genitourinary: Positive for vaginal discharge. Negative for dysuria, frequency, decreased urine volume and difficulty urinating.  Musculoskeletal: Negative for back pain and neck stiffness.  Skin: Negative for rash.  Neurological: Negative for dizziness, weakness, light-headedness and headaches.  All other systems reviewed and are negative.     Allergies  Ceftriaxone; Penicillins; Shellfish allergy; and Citrus  Home Medications   Prior to Admission medications   Medication Sig Start Date End Date Taking? Authorizing Provider  acetaminophen (TYLENOL) 500  MG tablet Take 500-1,000 mg by mouth every 6 (six) hours as needed for moderate pain (patient takes 1-2 depending on pain level).     Historical Provider, MD  albuterol (PROVENTIL HFA;VENTOLIN HFA) 108 (90 BASE) MCG/ACT inhaler Inhale 2 puffs into the lungs every 6 (six) hours as needed for wheezing or shortness of breath. 04/13/15   Josalyn Funches, MD  butalbital-acetaminophen-caffeine (FIORICET) 50-325-40 MG tablet Take 1-2 tablets by  mouth every 8 (eight) hours as needed for headache. Patient not taking: Reported on 08/10/2015 04/13/15 04/12/16  Boykin Nearing, MD  diphenhydrAMINE (BENADRYL) 25 MG tablet Take 50 mg by mouth at bedtime as needed for sleep (sleep).    Historical Provider, MD  docusate sodium (COLACE) 100 MG capsule Take 1 capsule (100 mg total) by mouth 2 (two) times daily. 08/14/15   Thurnell Lose, MD  ferrous sulfate 325 (65 FE) MG tablet Take 1 tablet (325 mg total) by mouth 2 (two) times daily with a meal. 08/14/15   Thurnell Lose, MD  gabapentin (NEURONTIN) 100 MG capsule TAKE 1 CAPSULE BY MOUTH THREE TIMES A DAY 08/14/15   Josalyn Funches, MD  glucose monitoring kit (FREESTYLE) monitoring kit 1 each by Does not apply route 4 (four) times daily - after meals and at bedtime. 1 month Diabetic Testing Supplies for QAC-QHS accuchecks.Any brand OK. Diagnosis E11.65 08/11/15   Thurnell Lose, MD  HYDROcodone-acetaminophen (NORCO/VICODIN) 5-325 MG tablet Take 1-2 tablets by mouth every 6 (six) hours as needed for moderate pain. 08/14/15   Thurnell Lose, MD  insulin aspart (NOVOLOG) 100 UNIT/ML injection Before each meal 3 times a day, 140-199 - 2 units, 200-250 - 4 units, 251-299 - 6 units,  300-349 - 8 units,  350 or above 10 units. Dispense syringes and needles as needed, Ok to switch to PEN if approved. Substitute to any brand approved. DX DM2, Code E11.65 08/11/15   Thurnell Lose, MD  insulin glargine (LANTUS) 100 UNIT/ML injection Inject 0.3 mLs (30 Units total) into the skin daily. Dispense insulin pen if approved, if not dispense as needed syringes and needles for 1 month supply. Can switch to Levemir. Diagnosis E 11.65. 08/14/15   Thurnell Lose, MD  Insulin Syringe-Needle U-100 25G X 1" 1 ML MISC For 4 times a day insulin SQ, 1 month supply. Diagnosis E11.65 08/11/15   Thurnell Lose, MD  Lancets (FREESTYLE) lancets For glucose testing every before meals at bedtime. Diagnosis E 11.65.   Can substitute  to any accepted brand 08/11/15   Thurnell Lose, MD  megestrol (MEGACE) 40 MG tablet TAKE ONE (1) TABLET BY MOUTH 3 TIMES DAILY 07/17/15   Mora Bellman, MD  metFORMIN (GLUCOPHAGE) 850 MG tablet Take 1 tablet (850 mg total) by mouth 2 (two) times daily with a meal. 08/16/15   Thurnell Lose, MD  metoprolol (LOPRESSOR) 50 MG tablet Take 1 tablet (50 mg total) by mouth 2 (two) times daily. 08/14/15   Thurnell Lose, MD  Multiple Vitamins-Minerals (MULTIVITAMIN & MINERAL PO) Take 1 tablet by mouth daily.    Historical Provider, MD  SUMAtriptan (IMITREX) 50 MG tablet Take 1 tablet (50 mg total) by mouth every 2 (two) hours as needed for migraine or headache. May repeat in 2 hours if headache persists or recurs. Do not exceed two doses in 7 day period. 03/15/15   Josalyn Funches, MD   BP 126/73 mmHg  Pulse 119  Temp(Src) 98.8 F (37.1 C) (Oral)  Resp  18  Ht _0  (1.778 m)  Wt 131.543 kg  BMI 41.61 kg/m2  SpO2 100%  LMP 08/12/2015 Physical Exam  Constitutional: She is oriented to person, place, and time. She appears well-developed and well-nourished. No distress.  HENT:  Head: Normocephalic and atraumatic.  Right Ear: External ear normal.  Left Ear: External ear normal.  Nose: Nose normal.  edentulous   Eyes: Conjunctivae and EOM are normal. Pupils are equal, round, and reactive to light. Right eye exhibits no discharge. Left eye exhibits no discharge. No scleral icterus.  Neck: Normal range of motion. Neck supple.  Cardiovascular: Normal rate, regular rhythm and normal heart sounds.  Exam reveals no gallop and no friction rub.   No murmur heard. Pulmonary/Chest: Effort normal and breath sounds normal. No stridor. No respiratory distress. She has no wheezes.  Abdominal: Soft. She exhibits no distension. There is tenderness in the epigastric area, suprapubic area and left upper quadrant.  Musculoskeletal: She exhibits no edema or tenderness.  Neurological: She is alert and oriented to  person, place, and time.  Skin: Skin is warm and dry. No rash noted. She is not diaphoretic. No erythema.  Psychiatric: She has a normal mood and affect.    ED Course  Procedures (including critical care time) Labs Review Labs Reviewed  CBC WITH DIFFERENTIAL/PLATELET - Abnormal; Notable for the following:    WBC 10.7 (*)    RBC 3.77 (*)    Hemoglobin 9.8 (*)    HCT 30.9 (*)    RDW 17.4 (*)    Monocytes Absolute 1.4 (*)    All other components within normal limits  URINALYSIS, ROUTINE W REFLEX MICROSCOPIC (NOT AT [facility]) - Abnormal; Notable for the following:    APPearance HAZY (*)    Glucose, UA >1000 (*)    Hgb urine dipstick LARGE (*)    Bilirubin Urine SMALL (*)    Ketones, ur >80 (*)    Protein, ur 30 (*)    All other components within normal limits  COMPREHENSIVE METABOLIC PANEL - Abnormal; Notable for the following:    Potassium 2.5 (*)    Glucose, Bld 266 (*)    BUN <5 (*)    Calcium 8.0 (*)    Total Protein 5.5 (*)    Albumin 2.3 (*)    AST 10 (*)    ALT 11 (*)    All other components within normal limits  URINE MICROSCOPIC-ADD ON - Abnormal; Notable for the following:    Squamous Epithelial / LPF 0-5 (*)    Bacteria, UA FEW (*)    All other components within normal limits  I-STAT CHEM 8, ED - Abnormal; Notable for the following:    Potassium 2.6 (*)    Chloride 97 (*)    BUN <3 (*)    Glucose, Bld 270 (*)    Calcium, Ion 1.01 (*)    Hemoglobin 10.9 (*)    HCT 32.0 (*)    All other components within normal limits  I-STAT VENOUS BLOOD GAS, ED - Abnormal; Notable for the following:    pH, Ven 7.488 (*)    pCO2, Ven 39.5 (*)    pO2, Ven 54.0 (*)    Bicarbonate 29.9 (*)    Acid-Base Excess 6.0 (*)    All other components within normal limits  CULTURE, BLOOD (ROUTINE X 2)  CULTURE, BLOOD (ROUTINE X 2)  URINE CULTURE  CULTURE, ROUTINE-GENITAL  LIPASE, BLOOD  POC URINE PREG, ED  I-STAT CG4 LACTIC ACID, ED  I-STAT CG4 LACTIC ACID, ED    Imaging  Review Dg Chest 2 View  08/18/2015  CLINICAL DATA:  Chest pain for 1 day. Status post uterine fibroid resection yesterday. EXAM: CHEST  2 VIEW COMPARISON:  08/11/2015 and chest CTA dated 08/12/2015. FINDINGS: Normal sized heart. Clear lungs. Skin fold overlying the medial right lung base. No pneumothorax. No pleural fluid. Mild scoliosis. IMPRESSION: No acute abnormality. Electronically Signed   By: Claudie Revering M.D.   On: 08/18/2015 18:21   Ct Abdomen Pelvis W Contrast  08/18/2015  CLINICAL DATA:  Diffuse abdominal pain, including upper abdominal pain and pelvic pain. Uterine fibroids were removed on 08/13/2015. EXAM: CT ABDOMEN AND PELVIS WITH CONTRAST TECHNIQUE: Multidetector CT imaging of the abdomen and pelvis was performed using the standard protocol following bolus administration of intravenous contrast. CONTRAST:  133m OMNIPAQUE IOHEXOL 300 MG/ML  SOLN COMPARISON:  None. FINDINGS: Mild focal scarring in the lung bases. Mild diffuse fatty infiltration of the liver. There is a focal circumscribed lesion in segment 4 of the liver measuring 2.1 cm diameter. The pattern is not typical for focal fatty infiltration in this may represent focal liver lesion. Follow-up with MRI is suggested. There is a right suprarenal mass lesion, likely arising from the adrenal gland and measuring 5.7 by 4.6 cm. The mass is low-attenuation in contains focal fat and focal calcification. The lesion is likely to represent a myelolipoma based on the presence of macroscopic fat. This could also be evaluated at MRI. The gallbladder is surgically absent. The pancreas, spleen, left adrenal gland, kidneys, abdominal aorta, inferior vena cava, and retroperitoneal lymph nodes are unremarkable. Stomach, small bowel, and colon are decompressed. No free air or free fluid in the abdomen. Abdominal wall musculature appears intact. Pelvis: There is a large uterine mass containing heterogeneous density and gas. This is measuring about 12.8 x  14.1 x 10.9 cm. There has been recent uterine artery embolization in this may represent changes due to necrosis of the fibroid after embolization. Presence of gas does raise suspicion for infection. Endometritis is a known complication following uterine artery embolization and may be present. The appendix is normal. No free or loculated pelvic fluid collections. No pelvic lymphadenopathy. Bladder wall is not thickened. Degenerative changes in the lumbar spine with degenerative disc disease at L5-S1. No destructive bone lesions. IMPRESSION: 1. 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 There is gas present within the area of necrosis suggesting possible superinfection and endometritis. 2. Indeterminate circumscribed lesion in segment 4 of the liver, possibly focal fatty infiltration but appearance is not typical. Consider follow-up with elective MRI. 3. Heterogeneous right adrenal gland mass containing macroscopic fat and calcification. 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 This could also be evaluated at the time of MRI. Electronically Signed   By: WLucienne CapersM.D.   On: 08/18/2015 21:48   I have personally reviewed and evaluated these images and lab results as part of my medical decision-making.   EKG Interpretation   Date/Time:  Friday August 18 2015 18:21:25 EST Ventricular Rate:  115 PR Interval:  131 QRS Duration: 72 QT Interval:  436 QTC Calculation: 603 R Axis:   43 Text Interpretation:  Sinus tachycardia Posterior infarct, old Borderline  T abnormalities, inferior leads Prolonged QT interval No significant  change since last tracing Confirmed by YAO  MD, DAVID ([id number] on 08/18/2015  6:25:10 PM      MDM   50year old female  who was recently admitted for new onset of diabetes found to be in DKA at that time. He was also treated for vaginal bleeding secondary leyoma, and had IR embolization. She  presents today with worsening abdominal pain and vaginal discharge. Rest of the history as above.  On arrival patient is afebrile with stable vital signs in obvious discomfort. Significant suprapubic abdominal pain. Rest of the history as above. Patient given IV fluids and symptomatic treatment.  Workup significant for a likely endometritis. Spoke with Dr. Glo Herring who agreed with starting Cl[place] and gentamicin would be appropriate. Workup also notable for hypokalemia. Patient given IV and by mouth potassium repletion. No evidence of DKA.   Patient will be admitted to the hospitalist service with GYN to follow.  Diagnostic studies interpreted by me in used and my clinical decision making.  Patient seen in conjunction with Dr. Darl Householder.   Final diagnoses:  Endometritis  Hypokalemia        Addison Lank, MD 08/19/15 0004  Wandra Arthurs, MD 08/22/15 [phone]

## 2015-08-18 NOTE — ED Notes (Signed)
Patient transported to CT 

## 2015-08-19 ENCOUNTER — Encounter (HOSPITAL_COMMUNITY): Payer: Self-pay | Admitting: Family Medicine

## 2015-08-19 DIAGNOSIS — E119 Type 2 diabetes mellitus without complications: Secondary | ICD-10-CM

## 2015-08-19 DIAGNOSIS — R9431 Abnormal electrocardiogram [ECG] [EKG]: Secondary | ICD-10-CM | POA: Diagnosis present

## 2015-08-19 DIAGNOSIS — E876 Hypokalemia: Secondary | ICD-10-CM | POA: Diagnosis present

## 2015-08-19 DIAGNOSIS — Z794 Long term (current) use of insulin: Secondary | ICD-10-CM

## 2015-08-19 DIAGNOSIS — A419 Sepsis, unspecified organism: Principal | ICD-10-CM

## 2015-08-19 DIAGNOSIS — N719 Inflammatory disease of uterus, unspecified: Secondary | ICD-10-CM

## 2015-08-19 DIAGNOSIS — IMO0001 Reserved for inherently not codable concepts without codable children: Secondary | ICD-10-CM

## 2015-08-19 LAB — CBC WITH DIFFERENTIAL/PLATELET
BASOS PCT: 0 %
Basophils Absolute: 0 10*3/uL (ref 0.0–0.1)
EOS ABS: 0.1 10*3/uL (ref 0.0–0.7)
EOS PCT: 1 %
HCT: 28.8 % — ABNORMAL LOW (ref 36.0–46.0)
Hemoglobin: 9.2 g/dL — ABNORMAL LOW (ref 12.0–15.0)
LYMPHS PCT: 17 %
Lymphs Abs: 1.7 10*3/uL (ref 0.7–4.0)
MCH: 26.5 pg (ref 26.0–34.0)
MCHC: 31.9 g/dL (ref 30.0–36.0)
MCV: 83 fL (ref 78.0–100.0)
MONO ABS: 1.2 10*3/uL — AB (ref 0.1–1.0)
MONOS PCT: 12 %
NEUTROS ABS: 7.2 10*3/uL (ref 1.7–7.7)
NEUTROS PCT: 70 %
Platelets: 347 10*3/uL (ref 150–400)
RBC: 3.47 MIL/uL — ABNORMAL LOW (ref 3.87–5.11)
RDW: 17.7 % — AB (ref 11.5–15.5)
WBC: 10.2 10*3/uL (ref 4.0–10.5)

## 2015-08-19 LAB — GENTAMICIN LEVEL, RANDOM: Gentamicin Rm: 0.8 ug/mL

## 2015-08-19 LAB — BASIC METABOLIC PANEL
Anion gap: 12 (ref 5–15)
CO2: 23 mmol/L (ref 22–32)
CREATININE: 0.64 mg/dL (ref 0.44–1.00)
Calcium: 7.8 mg/dL — ABNORMAL LOW (ref 8.9–10.3)
Chloride: 106 mmol/L (ref 101–111)
GFR calc Af Amer: >60 mL/min (ref >60)
GLUCOSE: 247 mg/dL — AB (ref 65–99)
Potassium: 3.3 mmol/L — ABNORMAL LOW (ref 3.5–5.1)
SODIUM: 141 mmol/L (ref 135–145)

## 2015-08-19 LAB — GLUCOSE, CAPILLARY
GLUCOSE-CAPILLARY: 223 mg/dL — AB (ref 65–99)
GLUCOSE-CAPILLARY: 261 mg/dL — AB (ref 65–99)
Glucose-Capillary: 256 mg/dL — ABNORMAL HIGH (ref 65–99)
Glucose-Capillary: 243 mg/dL — ABNORMAL HIGH (ref 65–99)

## 2015-08-19 LAB — LACTIC ACID, PLASMA
LACTIC ACID, VENOUS: 0.7 mmol/L (ref 0.5–2.0)
Lactic Acid, Venous: 0.7 mmol/L (ref 0.5–2.0)

## 2015-08-19 LAB — ABO/RH: ABO/RH(D): O POS

## 2015-08-19 LAB — TYPE AND SCREEN
ABO/RH(D): O POS
Antibody Screen: NEGATIVE

## 2015-08-19 LAB — PROTIME-INR
INR: 1.43 (ref 0.00–1.49)
PROTHROMBIN TIME: 17.5 s — AB (ref 11.6–15.2)

## 2015-08-19 LAB — APTT: aPTT: 29 seconds (ref 24–37)

## 2015-08-19 LAB — PROCALCITONIN: Procalcitonin: 1.75 ng/mL

## 2015-08-19 LAB — MAGNESIUM: MAGNESIUM: 1.4 mg/dL — AB (ref 1.7–2.4)

## 2015-08-19 MED ORDER — METOPROLOL TARTRATE 50 MG PO TABS
50.0000 mg | ORAL_TABLET | Freq: Two times a day (BID) | ORAL | Status: DC
  Administered 2015-08-19 – 2015-08-21 (×6): 50 mg via ORAL
  Filled 2015-08-19 (×6): qty 1

## 2015-08-19 MED ORDER — HYDROMORPHONE 1 MG/ML IV SOLN: INTRAVENOUS | Status: DC

## 2015-08-19 MED ORDER — GABAPENTIN 100 MG PO CAPS
100.0000 mg | ORAL_CAPSULE | Freq: Three times a day (TID) | ORAL | Status: DC
  Administered 2015-08-19 – 2015-08-21 (×7): 100 mg via ORAL
  Filled 2015-08-19 (×7): qty 1

## 2015-08-19 MED ORDER — DIPHENHYDRAMINE HCL 12.5 MG/5ML PO ELIX: 12.5000 mg | ORAL_SOLUTION | Freq: Four times a day (QID) | ORAL | Status: DC | PRN

## 2015-08-19 MED ORDER — GENTAMICIN SULFATE 40 MG/ML IJ SOLN
7.0000 mg/kg | INTRAVENOUS | Status: DC
  Administered 2015-08-19 – 2015-08-21 (×3): 660 mg via INTRAVENOUS
  Filled 2015-08-19 (×3): qty 16.5

## 2015-08-19 MED ORDER — MEGESTROL ACETATE 40 MG PO TABS
40.0000 mg | ORAL_TABLET | Freq: Three times a day (TID) | ORAL | Status: DC
  Administered 2015-08-19 – 2015-08-21 (×7): 40 mg via ORAL
  Filled 2015-08-19 (×9): qty 1

## 2015-08-19 MED ORDER — CLINDAMYCIN PHOSPHATE 900 MG/50ML IV SOLN
900.0000 mg | Freq: Three times a day (TID) | INTRAVENOUS | Status: DC
  Administered 2015-08-19 – 2015-08-21 (×8): 900 mg via INTRAVENOUS
  Filled 2015-08-19 (×9): qty 50

## 2015-08-19 MED ORDER — NALOXONE HCL 0.4 MG/ML IJ SOLN: 0.4000 mg | INTRAMUSCULAR | Status: DC | PRN

## 2015-08-19 MED ORDER — SODIUM CHLORIDE 0.9% FLUSH: 9.0000 mL | INTRAVENOUS | Status: DC | PRN

## 2015-08-19 MED ORDER — ALBUTEROL SULFATE (2.5 MG/3ML) 0.083% IN NEBU: 2.5000 mg | INHALATION_SOLUTION | Freq: Four times a day (QID) | RESPIRATORY_TRACT | Status: DC | PRN

## 2015-08-19 MED ORDER — DEXTROSE 5 % IV SOLN: Freq: Three times a day (TID) | INTRAVENOUS | Status: DC

## 2015-08-19 MED ORDER — SODIUM CHLORIDE 0.9 % IV SOLN
INTRAVENOUS | Status: DC
  Administered 2015-08-19 – 2015-08-20 (×2): via INTRAVENOUS

## 2015-08-19 MED ORDER — INSULIN ASPART 100 UNIT/ML ~~LOC~~ SOLN
0.0000 [IU] | Freq: Three times a day (TID) | SUBCUTANEOUS | Status: DC
  Administered 2015-08-19 (×2): 8 [IU] via SUBCUTANEOUS
  Administered 2015-08-20: 5 [IU] via SUBCUTANEOUS
  Administered 2015-08-20: 11 [IU] via SUBCUTANEOUS
  Administered 2015-08-20 – 2015-08-21 (×3): 5 [IU] via SUBCUTANEOUS

## 2015-08-19 MED ORDER — ONDANSETRON HCL 4 MG/2ML IJ SOLN: 4.0000 mg | Freq: Four times a day (QID) | INTRAMUSCULAR | Status: DC | PRN

## 2015-08-19 MED ORDER — INSULIN GLARGINE 100 UNIT/ML ~~LOC~~ SOLN
35.0000 [IU] | SUBCUTANEOUS | Status: DC
  Administered 2015-08-20 (×2): 35 [IU] via SUBCUTANEOUS
  Filled 2015-08-19 (×2): qty 0.35

## 2015-08-19 MED ORDER — MAGNESIUM SULFATE 2 GM/50ML IV SOLN
2.0000 g | INTRAVENOUS | Status: DC
  Administered 2015-08-19: 2 g via INTRAVENOUS
  Filled 2015-08-19: qty 50

## 2015-08-19 MED ORDER — HEPARIN SODIUM (PORCINE) 5000 UNIT/ML IJ SOLN
5000.0000 [IU] | Freq: Three times a day (TID) | INTRAMUSCULAR | Status: DC
  Administered 2015-08-19 – 2015-08-21 (×8): 5000 [IU] via SUBCUTANEOUS
  Filled 2015-08-19 (×8): qty 1

## 2015-08-19 MED ORDER — POTASSIUM CHLORIDE CRYS ER 20 MEQ PO TBCR
40.0000 meq | EXTENDED_RELEASE_TABLET | Freq: Four times a day (QID) | ORAL | Status: AC
  Administered 2015-08-19 (×2): 40 meq via ORAL
  Filled 2015-08-19 (×2): qty 2

## 2015-08-19 MED ORDER — ACETAMINOPHEN 325 MG PO TABS: 650.0000 mg | ORAL_TABLET | Freq: Four times a day (QID) | ORAL | Status: DC | PRN

## 2015-08-19 MED ORDER — MORPHINE SULFATE (PF) 2 MG/ML IV SOLN
2.0000 mg | INTRAVENOUS | Status: DC | PRN
  Administered 2015-08-19 (×4): 2 mg via INTRAVENOUS
  Filled 2015-08-19 (×4): qty 1

## 2015-08-19 MED ORDER — POTASSIUM CHLORIDE 10 MEQ/100ML IV SOLN
10.0000 meq | INTRAVENOUS | Status: DC
  Filled 2015-08-19: qty 100

## 2015-08-19 MED ORDER — DOCUSATE SODIUM 100 MG PO CAPS
100.0000 mg | ORAL_CAPSULE | Freq: Two times a day (BID) | ORAL | Status: DC
  Administered 2015-08-19 – 2015-08-21 (×5): 100 mg via ORAL
  Filled 2015-08-19 (×5): qty 1

## 2015-08-19 MED ORDER — HYDROMORPHONE HCL 1 MG/ML IJ SOLN
1.0000 mg | INTRAMUSCULAR | Status: DC | PRN
Start: 2015-08-19 — End: 2015-08-21
  Filled 2015-08-19: qty 1

## 2015-08-19 MED ORDER — ALBUTEROL SULFATE HFA 108 (90 BASE) MCG/ACT IN AERS: 2.0000 | INHALATION_SPRAY | Freq: Four times a day (QID) | RESPIRATORY_TRACT | Status: DC | PRN

## 2015-08-19 MED ORDER — FERROUS SULFATE 325 (65 FE) MG PO TABS
325.0000 mg | ORAL_TABLET | Freq: Two times a day (BID) | ORAL | Status: DC
  Administered 2015-08-19 – 2015-08-21 (×5): 325 mg via ORAL
  Filled 2015-08-19 (×5): qty 1

## 2015-08-19 MED ORDER — SODIUM CHLORIDE 0.9% FLUSH
3.0000 mL | Freq: Two times a day (BID) | INTRAVENOUS | Status: DC
  Administered 2015-08-20 – 2015-08-21 (×2): 3 mL via INTRAVENOUS

## 2015-08-19 MED ORDER — MAGNESIUM SULFATE 2 GM/50ML IV SOLN
2.0000 g | Freq: Once | INTRAVENOUS | Status: AC
  Administered 2015-08-19: 2 g via INTRAVENOUS
  Filled 2015-08-19: qty 50

## 2015-08-19 MED ORDER — DIPHENHYDRAMINE HCL 25 MG PO CAPS: 50.0000 mg | ORAL_CAPSULE | Freq: Every evening | ORAL | Status: DC | PRN

## 2015-08-19 MED ORDER — HYDROCODONE-ACETAMINOPHEN 5-325 MG PO TABS
1.0000 | ORAL_TABLET | ORAL | Status: DC | PRN
  Administered 2015-08-19 – 2015-08-21 (×4): 2 via ORAL
  Filled 2015-08-19 (×4): qty 2

## 2015-08-19 MED ORDER — ONDANSETRON HCL 4 MG PO TABS: 4.0000 mg | ORAL_TABLET | Freq: Four times a day (QID) | ORAL | Status: DC | PRN

## 2015-08-19 MED ORDER — POTASSIUM CHLORIDE IN NACL 40-0.9 MEQ/L-% IV SOLN
INTRAVENOUS | Status: DC
  Administered 2015-08-19: 100 mL/h via INTRAVENOUS
  Filled 2015-08-19 (×2): qty 1000

## 2015-08-19 MED ORDER — DIPHENHYDRAMINE HCL 50 MG/ML IJ SOLN: 12.5000 mg | Freq: Four times a day (QID) | INTRAMUSCULAR | Status: DC | PRN

## 2015-08-19 MED ORDER — BISACODYL 10 MG RE SUPP: 10.0000 mg | Freq: Every day | RECTAL | Status: DC | PRN

## 2015-08-19 MED ORDER — POLYETHYLENE GLYCOL 3350 17 G PO PACK: 17.0000 g | PACK | Freq: Every day | ORAL | Status: DC | PRN

## 2015-08-19 MED ORDER — KETOROLAC TROMETHAMINE 30 MG/ML IJ SOLN
30.0000 mg | Freq: Four times a day (QID) | INTRAMUSCULAR | Status: DC | PRN
  Administered 2015-08-19 – 2015-08-20 (×4): 30 mg via INTRAVENOUS
  Filled 2015-08-19 (×4): qty 1

## 2015-08-19 MED ORDER — MAGNESIUM SULFATE 50 % IJ SOLN: 2.0000 g | Freq: Once | INTRAMUSCULAR | Status: DC

## 2015-08-19 MED ORDER — ACETAMINOPHEN 650 MG RE SUPP
650.0000 mg | Freq: Four times a day (QID) | RECTAL | Status: DC | PRN
Start: 2015-08-19 — End: 2015-08-21

## 2015-08-19 NOTE — Progress Notes (Signed)
Chief Complaint: Abdominal pain Recent [place] by Dr. Pascal Lux 08/13/2015  Referring Physician(s): Opyd  History of Present Illness: Melinda [name] is a 50 y.o. female who recently underwent a uterine artery embolization by Dr. Pascal Lux due to symptomatic uterine fibroids and anemia.  This was done last Sunday 08/13/2015.  She returns today c/o persistent abdominal pain and nausea.   She was admitted from 08/10/2015 to 08/14/2015 with polydipsia and polyuria and found to be in DKA with new diagnosis of type 2 diabetes mellitus. The [place] was performed during that admission. She was discharged in good condition on 08/14/2015.  She reports she has been having chills and abdominal pain which had continued to worsen, and with severe abdominal pain today.  She denies fevers.   She reports a brownish vaginal discharge since returning home.   She has had nausea, but no diarrhea or vomiting. There's been no dysuria or flank pain.   CT of the abdomen and pelvis showed a diffusely enlarged and heterogenous uterine mass consistent with post embolization necrosis. There is gas present within the mass, raising suspicion for endometritis  Dr. Glo Herring of OB/GYN was consulted from the emergency department and recommended admission to the hospital for empiric antibiotics.  She has not been taking her insulin, explaining that she is unable to read the labels due to or blurred vision.  She is hemodynamically stable at this time.  Past Medical History  Diagnosis Date  . Migraine   . PTSD (post-traumatic stress disorder)   . Asthma   . GERD (gastroesophageal reflux disease)   . Scoliosis   . Arthritis   . ADHD (attention deficit hyperactivity disorder)   . Peripheral neuropathy ([place])   . Iron deficiency anemia due to chronic blood loss 09/09/2014  . Hyperglycemia 09/09/2014  . Low TSH level 09/08/2014  . Menorrhagia 09/08/2014  . Ovarian mass, right 09/09/2014  . PE (pulmonary embolism)   . Lumbar herniated  disc   . Multiple thyroid nodules     Past Surgical History  Procedure Laterality Date  . Cholecystectomy      Allergies: Ceftriaxone; Penicillins; Shellfish allergy; and Citrus  Medications: Prior to Admission medications   Medication Sig Start Date End Date Taking? Authorizing Provider  acetaminophen (TYLENOL) 500 MG tablet Take 500-1,000 mg by mouth every 6 (six) hours as needed for moderate pain (patient takes 1-2 depending on pain level).     Historical Provider, MD  albuterol (PROVENTIL HFA;VENTOLIN HFA) 108 (90 BASE) MCG/ACT inhaler Inhale 2 puffs into the lungs every 6 (six) hours as needed for wheezing or shortness of breath. 04/13/15   Josalyn Funches, MD  butalbital-acetaminophen-caffeine (FIORICET) 50-325-40 MG tablet Take 1-2 tablets by mouth every 8 (eight) hours as needed for headache. Patient not taking: Reported on 08/10/2015 04/13/15 04/12/16  Boykin Nearing, MD  diphenhydrAMINE (BENADRYL) 25 MG tablet Take 50 mg by mouth at bedtime as needed for sleep (sleep).    Historical Provider, MD  docusate sodium (COLACE) 100 MG capsule Take 1 capsule (100 mg total) by mouth 2 (two) times daily. 08/14/15   Thurnell Lose, MD  ferrous sulfate 325 (65 FE) MG tablet Take 1 tablet (325 mg total) by mouth 2 (two) times daily with a meal. 08/14/15   Thurnell Lose, MD  gabapentin (NEURONTIN) 100 MG capsule TAKE 1 CAPSULE BY MOUTH THREE TIMES A DAY 08/14/15   Josalyn Funches, MD  glucose monitoring kit (FREESTYLE) monitoring kit 1 each by Does not apply route 4 (four)  times daily - after meals and at bedtime. 1 month Diabetic Testing Supplies for QAC-QHS accuchecks.Any brand OK. Diagnosis E11.65 08/11/15   Thurnell Lose, MD  HYDROcodone-acetaminophen (NORCO/VICODIN) 5-325 MG tablet Take 1-2 tablets by mouth every 6 (six) hours as needed for moderate pain. 08/14/15   Thurnell Lose, MD  insulin aspart (NOVOLOG) 100 UNIT/ML injection Before each meal 3 times a day, 140-199 - 2 units,  200-250 - 4 units, 251-299 - 6 units,  300-349 - 8 units,  350 or above 10 units. Dispense syringes and needles as needed, Ok to switch to PEN if approved. Substitute to any brand approved. DX DM2, Code E11.65 08/11/15   Thurnell Lose, MD  insulin glargine (LANTUS) 100 UNIT/ML injection Inject 0.3 mLs (30 Units total) into the skin daily. Dispense insulin pen if approved, if not dispense as needed syringes and needles for 1 month supply. Can switch to Levemir. Diagnosis E 11.65. 08/14/15   Thurnell Lose, MD  Insulin Syringe-Needle U-100 25G X 1" 1 ML MISC For 4 times a day insulin SQ, 1 month supply. Diagnosis E11.65 08/11/15   Thurnell Lose, MD  Lancets (FREESTYLE) lancets For glucose testing every before meals at bedtime. Diagnosis E 11.65.   Can substitute to any accepted brand 08/11/15   Thurnell Lose, MD  megestrol (MEGACE) 40 MG tablet TAKE ONE (1) TABLET BY MOUTH 3 TIMES DAILY 07/17/15   Mora Bellman, MD  metFORMIN (GLUCOPHAGE) 850 MG tablet Take 1 tablet (850 mg total) by mouth 2 (two) times daily with a meal. 08/16/15   Thurnell Lose, MD  metoprolol (LOPRESSOR) 50 MG tablet Take 1 tablet (50 mg total) by mouth 2 (two) times daily. 08/14/15   Thurnell Lose, MD  Multiple Vitamins-Minerals (MULTIVITAMIN & MINERAL PO) Take 1 tablet by mouth daily.    Historical Provider, MD  SUMAtriptan (IMITREX) 50 MG tablet Take 1 tablet (50 mg total) by mouth every 2 (two) hours as needed for migraine or headache. May repeat in 2 hours if headache persists or recurs. Do not exceed two doses in 7 day period. 03/15/15   Boykin Nearing, MD     Family History  Problem Relation Age of Onset  . Diabetes Mother   . Breast cancer Mother   . CAD Mother   . Hypertension Mother   . Alcohol abuse Father   . Hypertension Father     Social History   Social History  . Marital Status: Single    Spouse Name: N/A  . Number of Children: N/A  . Years of Education: N/A   Social History Main Topics  .  Smoking status: Former Smoker -- 0.50 packs/day for 40 years    Types: Cigarettes    Quit date: 11/03/2014  . Smokeless tobacco: Never Used  . Alcohol Use: No  . Drug Use: Yes    Special: Methamphetamines     Comment: 8/24/16per patient does not use methamphetamine  . Sexual Activity: No   Other Topics Concern  . None   Social History Narrative    Review of Systems: A 12 point ROS discussed and pertinent positives are indicated in the HPI above.  All other systems are negative.  Review of Systems  Constitutional: Positive for chills, activity change, appetite change and fatigue. Negative for fever.  HENT: Negative.   Respiratory: Negative for cough, chest tightness and shortness of breath.   Cardiovascular: Negative for chest pain.  Gastrointestinal: Positive for nausea and abdominal pain.  Negative for vomiting and diarrhea.  Genitourinary: Positive for vaginal discharge and pelvic pain.  Musculoskeletal: Negative.   Skin: Negative.   Neurological: Negative.   Psychiatric/Behavioral: Negative.     Vital Signs: BP 155/80 mmHg  Pulse 80  Temp(Src) 98.1 F (36.7 C) (Oral)  Resp 18  Ht 5' 10" (1.778 m)  Wt 299 lb 9.6 oz (135.898 kg)  BMI 42.99 kg/m2  SpO2 98%  LMP 08/11/2015  Physical Exam  Constitutional: She is oriented to person, place, and time.  Morbidly obese, NAD  HENT:  Head: Normocephalic and atraumatic.  Eyes: EOM are normal.  Neck: Normal range of motion. Neck supple.  Cardiovascular: Regular rhythm.   No murmur heard. + tachycardia, rate 104  Pulmonary/Chest: Effort normal and breath sounds normal. No respiratory distress. She has no wheezes.  Abdominal: Soft. She exhibits no mass. There is tenderness. There is guarding.  Musculoskeletal: Normal range of motion.  Neurological: She is alert and oriented to person, place, and time.  Skin: Skin is warm and dry.  Psychiatric: She has a normal mood and affect. Her behavior is normal. Judgment and thought  content normal.  Vitals reviewed.   Mallampati Score:     Imaging: Dg Chest 2 View  08/18/2015  CLINICAL DATA:  Chest pain for 1 day. Status post uterine fibroid resection yesterday. EXAM: CHEST  2 VIEW COMPARISON:  08/11/2015 and chest CTA dated 08/12/2015. FINDINGS: Normal sized heart. Clear lungs. Skin fold overlying the medial right lung base. No pneumothorax. No pleural fluid. Mild scoliosis. IMPRESSION: No acute abnormality. Electronically Signed   By: Claudie Revering M.D.   On: 08/18/2015 18:21   Ct Angio Chest Pe W/cm &/or Wo Cm  08/12/2015  CLINICAL DATA:  Shortness of breath for 2 weeks. Admitted with severe vaginal bleeding. EXAM: CT ANGIOGRAPHY CHEST WITH CONTRAST TECHNIQUE: Multidetector CT imaging of the chest was performed using the standard protocol during bolus administration of intravenous contrast. Multiplanar CT image reconstructions and MIPs were obtained to evaluate the vascular anatomy. CONTRAST:  181m OMNIPAQUE IOHEXOL 350 MG/ML SOLN COMPARISON:  Chest x-ray on 08/10/2005 FINDINGS: The central pulmonary arteries are well opacified. Peripheral branch artery opacification is somewhat limited due to timing of contrast bolus and patient body habitus. There is no evidence of pulmonary embolism. The thoracic aorta is normal in caliber. The heart size is normal. No pleural or pericardial fluid identified. Lungs show no evidence of edema, infiltrate or nodule. Mild scarring/ atelectasis present at both lung bases. Focal followup of the anterior right upper lobe without pneumothorax. No lymphadenopathy identified. In the upper abdomen, there is an incidental mass just superior to the right kidney and likely originating from the right adrenal gland. This lesion measures approximately 5.3 x 4.8 x 4.4 cm and contains areas of macroscopic fat and overall is of low density (-15 to 4 HU) by Hounsfield measurements. There are some partial calcifications anteriorly within the lesion. This most  likely represents a myelolipoma of the adrenal gland. Review of the MIP images confirms the above findings. IMPRESSION: 1. No evidence of pulmonary embolism or other acute findings in the chest. 2. Incidental 5 cm mass of the right adrenal gland with low density, macroscopic internal fat and areas of anterior calcification. The focal areas of calcification may reflect prior fat necrosis or previous focal hemorrhage. 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 Electronically Signed   By: GAletta EdouardM.D.   On: 08/12/2015 13:41   Ct Abdomen Pelvis W Contrast  08/18/2015  CLINICAL DATA:  Diffuse abdominal pain, including upper abdominal pain and pelvic pain. Uterine fibroids were removed on 08/13/2015. EXAM: CT ABDOMEN AND PELVIS WITH CONTRAST TECHNIQUE: Multidetector CT imaging of the abdomen and pelvis was performed using the standard protocol following bolus administration of intravenous contrast. CONTRAST:  169m OMNIPAQUE IOHEXOL 300 MG/ML  SOLN COMPARISON:  None. FINDINGS: Mild focal scarring in the lung bases. Mild diffuse fatty infiltration of the liver. There is a focal circumscribed lesion in segment 4 of the liver measuring 2.1 cm diameter. The pattern is not typical for focal fatty infiltration in this may represent focal liver lesion. Follow-up with MRI is suggested. There is a right suprarenal mass lesion, likely arising from the adrenal gland and measuring 5.7 by 4.6 cm. The mass is low-attenuation in contains focal fat and focal calcification. The lesion is likely to represent a myelolipoma based on the presence of macroscopic fat. This could also be evaluated at MRI. The gallbladder is surgically absent. The pancreas, spleen, left adrenal gland, kidneys, abdominal aorta, inferior vena cava, and retroperitoneal lymph nodes are unremarkable. Stomach, small bowel, and colon are decompressed. No free air or free fluid in the abdomen. Abdominal wall musculature appears intact. Pelvis: There  is a large uterine mass containing heterogeneous density and gas. This is measuring about 12.8 x 14.1 x 10.9 cm. There has been recent uterine artery embolization in this may represent changes due to necrosis of the fibroid after embolization. Presence of gas does raise suspicion for infection. Endometritis is a known complication following uterine artery embolization and may be present. The appendix is normal. No free or loculated pelvic fluid collections. No pelvic lymphadenopathy. Bladder wall is not thickened. Degenerative changes in the lumbar spine with degenerative disc disease at L5-S1. No destructive bone lesions. IMPRESSION: 1. 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 There is gas present within the area of necrosis suggesting possible superinfection and endometritis. 2. Indeterminate circumscribed lesion in segment 4 of the liver, possibly focal fatty infiltration but appearance is not typical. Consider follow-up with elective MRI. 3. Heterogeneous right adrenal gland mass containing macroscopic fat and calcification. 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 This could also be evaluated at the time of MRI. Electronically Signed   By: WLucienne CapersM.D.   On: 08/18/2015 21:48   Ir Angiogram Pelvis Selective Or Supraselective  08/13/2015  INDICATION: Symptomatic uterine fibroids. Patient with persistent uterine daily refractory uterine bleeding. EXAM: 1. SELECTIVE BILATERAL INTERNAL ILIAC ARTERIOGRAM 2. SELECTIVE BILATERAL ANTERIOR DIVISION INTERNAL ILIAC ARTERIOGRAM 3. SELECTIVE BILATERAL UTERINE ARTERY ARTERIOGRAM AND PERCUTANEOUS PARTICLE EMBOLIZATION. 4. ULTRASOUND GUIDANCE FOR ARTERIAL ACCESS MEDICATIONS: Zofran 4 mg IV, Vancomycin 1.5 g IV; The antibiotic was administered within one hour of the procedure ANESTHESIA/SEDATION: Moderate (conscious) sedation was employed during this procedure. A total of Toradol  30 mg, Versed 6 mg and Fentanyl 300 mcg was administered intravenously. Moderate Sedation Time: 120 minutes. The patient's level of consciousness and vital signs were monitored continuously by radiology nursing throughout the procedure under my direct supervision. CONTRAST:  2166mOMNIPAQUE IOHEXOL 300 MG/ML  SOLN FLUOROSCOPY TIME:  38 minutes 24 seconds (4,6,387Gy) COMPLICATIONS: None immediate. PROCEDURE: Informed consent was obtained from the patient following explanation of the procedure, risks, benefits and alternatives. The patient understands, agrees and consents for the procedure. All questions were addressed. A time out was performed prior to the initiation of the procedure. Maximal barrier sterile technique utilized including  caps, mask, sterile gowns, sterile gloves, large sterile drape, hand hygiene, and Betadine prep. The right femoral head was marked fluoroscopically. Under sterile conditions and local anesthesia, the right common femoral artery access was performed with a micropuncture needle. Under direct ultrasound guidance, the right common femoral was accessed with a micropuncture kit. An ultrasound image was saved for documentation purposes. This allowed for placement of a 5-French vascular sheath. A limited arteriogram was performed through the side arm of the sheath confirming appropriate access within the right common femoral artery. The 5-French C2 catheter was utilized to select the contralateral left internal iliac artery. Selective left internal iliac angiogram was performed. The tortuous left uterine artery was identified. Selective catheterization was performed of the left uterine artery with a microcatheter and micro guide wire. A selective left uterine angiogram was performed. This demonstrated patency of the left uterine artery. Mild diffuse hypervascularity of the enlarged fibroid uterus. Access was adequate for embolization. For embolization, 3 vials of 500 - 700 micron and 2 vials  of 700-900 micron Embospheres were injected into the left uterine artery. Post embolization angiogram confirms complete stasis of the left uterine vascular territory. Microcatheter was removed. The C2 catheter was retracted and utilized to select the right internal iliac artery. Selective right internal iliac angiogram was performed. The patent right uterine artery was identified. For selective catheterization, the micro catheter and guidewire were utilized to select the right uterine artery. Selective right uterine angiogram was performed. This demonstrated patency of the right uterine artery. Catheter position was safe for embolization. Embolization was performed to complete stasis with injection of 2 vials of 500-700 micron Embospheres. Post embolization angiogram confirms complete stasis of the right uterine vascular territory. At this point, all wires, catheters and sheaths were removed from the patient. Hemostasis was achieved at the right groin access site with The patient tolerated the procedure well without immediate post procedural complication. FINDINGS: Conventional branching pattern of the bilateral internal iliac arteries. The bilateral uterine artery origins were noted to be markedly tortuous though lobe both vessels are widely patent supplying a markedly enlarged and myomatous uterus. Completion arteriograms following bilateral uterine artery particle embolization demonstrates a technically excellent result with stasis of flow within the bilateral uterine vascular territories. IMPRESSION: Successful bilateral uterine artery embolization ([facility]). Electronically Signed   By: [name] M.D.   On: 08/13/2015 12:33   Ir Angiogram Pelvis Selective Or Supraselective  08/13/2015  INDICATION: Symptomatic uterine fibroids. Patient with persistent uterine daily refractory uterine bleeding. EXAM: 1. SELECTIVE BILATERAL INTERNAL ILIAC ARTERIOGRAM 2. SELECTIVE BILATERAL ANTERIOR DIVISION INTERNAL ILIAC  ARTERIOGRAM 3. SELECTIVE BILATERAL UTERINE ARTERY ARTERIOGRAM AND PERCUTANEOUS PARTICLE EMBOLIZATION. 4. ULTRASOUND GUIDANCE FOR ARTERIAL ACCESS MEDICATIONS: Zofran 4 mg IV, Vancomycin 1.5 g IV; The antibiotic was administered within one hour of the procedure ANESTHESIA/SEDATION: Moderate (conscious) sedation was employed during this procedure. A total of Toradol 30 mg, Versed 6 mg and Fentanyl 300 mcg was administered intravenously. Moderate Sedation Time: 120 minutes. The patient's level of consciousness and vital signs were monitored continuously by radiology nursing throughout the procedure under my direct supervision. CONTRAST:  276m OMNIPAQUE IOHEXOL 300 MG/ML  SOLN FLUOROSCOPY TIME:  38 minutes 24 seconds (43,267mGy) COMPLICATIONS: None immediate. PROCEDURE: Informed consent was obtained from the patient following explanation of the procedure, risks, benefits and alternatives. The patient understands, agrees and consents for the procedure. All questions were addressed. A time out was performed prior to the initiation of the procedure. Maximal barrier sterile technique  utilized including caps, mask, sterile gowns, sterile gloves, large sterile drape, hand hygiene, and Betadine prep. The right femoral head was marked fluoroscopically. Under sterile conditions and local anesthesia, the right common femoral artery access was performed with a micropuncture needle. Under direct ultrasound guidance, the right common femoral was accessed with a micropuncture kit. An ultrasound image was saved for documentation purposes. This allowed for placement of a 5-French vascular sheath. A limited arteriogram was performed through the side arm of the sheath confirming appropriate access within the right common femoral artery. The 5-French C2 catheter was utilized to select the contralateral left internal iliac artery. Selective left internal iliac angiogram was performed. The tortuous left uterine artery was identified.  Selective catheterization was performed of the left uterine artery with a microcatheter and micro guide wire. A selective left uterine angiogram was performed. This demonstrated patency of the left uterine artery. Mild diffuse hypervascularity of the enlarged fibroid uterus. Access was adequate for embolization. For embolization, 3 vials of 500 - 700 micron and 2 vials of 700-900 micron Embospheres were injected into the left uterine artery. Post embolization angiogram confirms complete stasis of the left uterine vascular territory. Microcatheter was removed. The C2 catheter was retracted and utilized to select the right internal iliac artery. Selective right internal iliac angiogram was performed. The patent right uterine artery was identified. For selective catheterization, the micro catheter and guidewire were utilized to select the right uterine artery. Selective right uterine angiogram was performed. This demonstrated patency of the right uterine artery. Catheter position was safe for embolization. Embolization was performed to complete stasis with injection of 2 vials of 500-700 micron Embospheres. Post embolization angiogram confirms complete stasis of the right uterine vascular territory. At this point, all wires, catheters and sheaths were removed from the patient. Hemostasis was achieved at the right groin access site with The patient tolerated the procedure well without immediate post procedural complication. FINDINGS: Conventional branching pattern of the bilateral internal iliac arteries. The bilateral uterine artery origins were noted to be markedly tortuous though lobe both vessels are widely patent supplying a markedly enlarged and myomatous uterus. Completion arteriograms following bilateral uterine artery particle embolization demonstrates a technically excellent result with stasis of flow within the bilateral uterine vascular territories. IMPRESSION: Successful bilateral uterine artery  embolization (U F E). Electronically Signed   By: [name] M.D.   On: 08/13/2015 12:33   Ir Angiogram Selective Each Additional Vessel  08/13/2015  INDICATION: Symptomatic uterine fibroids. Patient with persistent uterine daily refractory uterine bleeding. EXAM: 1. SELECTIVE BILATERAL INTERNAL ILIAC ARTERIOGRAM 2. SELECTIVE BILATERAL ANTERIOR DIVISION INTERNAL ILIAC ARTERIOGRAM 3. SELECTIVE BILATERAL UTERINE ARTERY ARTERIOGRAM AND PERCUTANEOUS PARTICLE EMBOLIZATION. 4. ULTRASOUND GUIDANCE FOR ARTERIAL ACCESS MEDICATIONS: Zofran 4 mg IV, Vancomycin 1.5 g IV; The antibiotic was administered within one hour of the procedure ANESTHESIA/SEDATION: Moderate (conscious) sedation was employed during this procedure. A total of Toradol 30 mg, Versed 6 mg and Fentanyl 300 mcg was administered intravenously. Moderate Sedation Time: 120 minutes. The patient's level of consciousness and vital signs were monitored continuously by radiology nursing throughout the procedure under my direct supervision. CONTRAST:  225m OMNIPAQUE IOHEXOL 300 MG/ML  SOLN FLUOROSCOPY TIME:  38 minutes 24 seconds (45,427mGy) COMPLICATIONS: None immediate. PROCEDURE: Informed consent was obtained from the patient following explanation of the procedure, risks, benefits and alternatives. The patient understands, agrees and consents for the procedure. All questions were addressed. A time out was performed prior to the initiation of the procedure. Maximal barrier  sterile technique utilized including caps, mask, sterile gowns, sterile gloves, large sterile drape, hand hygiene, and Betadine prep. The right femoral head was marked fluoroscopically. Under sterile conditions and local anesthesia, the right common femoral artery access was performed with a micropuncture needle. Under direct ultrasound guidance, the right common femoral was accessed with a micropuncture kit. An ultrasound image was saved for documentation purposes. This allowed for placement  of a 5-French vascular sheath. A limited arteriogram was performed through the side arm of the sheath confirming appropriate access within the right common femoral artery. The 5-French C2 catheter was utilized to select the contralateral left internal iliac artery. Selective left internal iliac angiogram was performed. The tortuous left uterine artery was identified. Selective catheterization was performed of the left uterine artery with a microcatheter and micro guide wire. A selective left uterine angiogram was performed. This demonstrated patency of the left uterine artery. Mild diffuse hypervascularity of the enlarged fibroid uterus. Access was adequate for embolization. For embolization, 3 vials of 500 - 700 micron and 2 vials of 700-900 micron Embospheres were injected into the left uterine artery. Post embolization angiogram confirms complete stasis of the left uterine vascular territory. Microcatheter was removed. The C2 catheter was retracted and utilized to select the right internal iliac artery. Selective right internal iliac angiogram was performed. The patent right uterine artery was identified. For selective catheterization, the micro catheter and guidewire were utilized to select the right uterine artery. Selective right uterine angiogram was performed. This demonstrated patency of the right uterine artery. Catheter position was safe for embolization. Embolization was performed to complete stasis with injection of 2 vials of 500-700 micron Embospheres. Post embolization angiogram confirms complete stasis of the right uterine vascular territory. At this point, all wires, catheters and sheaths were removed from the patient. Hemostasis was achieved at the right groin access site with The patient tolerated the procedure well without immediate post procedural complication. FINDINGS: Conventional branching pattern of the bilateral internal iliac arteries. The bilateral uterine artery origins were noted to  be markedly tortuous though lobe both vessels are widely patent supplying a markedly enlarged and myomatous uterus. Completion arteriograms following bilateral uterine artery particle embolization demonstrates a technically excellent result with stasis of flow within the bilateral uterine vascular territories. IMPRESSION: Successful bilateral uterine artery embolization ([facility]). Electronically Signed   By: [name] M.D.   On: 08/13/2015 12:33   Ir Angiogram Selective Each Additional Vessel  08/13/2015  INDICATION: Symptomatic uterine fibroids. Patient with persistent uterine daily refractory uterine bleeding. EXAM: 1. SELECTIVE BILATERAL INTERNAL ILIAC ARTERIOGRAM 2. SELECTIVE BILATERAL ANTERIOR DIVISION INTERNAL ILIAC ARTERIOGRAM 3. SELECTIVE BILATERAL UTERINE ARTERY ARTERIOGRAM AND PERCUTANEOUS PARTICLE EMBOLIZATION. 4. ULTRASOUND GUIDANCE FOR ARTERIAL ACCESS MEDICATIONS: Zofran 4 mg IV, Vancomycin 1.5 g IV; The antibiotic was administered within one hour of the procedure ANESTHESIA/SEDATION: Moderate (conscious) sedation was employed during this procedure. A total of Toradol 30 mg, Versed 6 mg and Fentanyl 300 mcg was administered intravenously. Moderate Sedation Time: 120 minutes. The patient's level of consciousness and vital signs were monitored continuously by radiology nursing throughout the procedure under my direct supervision. CONTRAST:  257m OMNIPAQUE IOHEXOL 300 MG/ML  SOLN FLUOROSCOPY TIME:  38 minutes 24 seconds (44,403mGy) COMPLICATIONS: None immediate. PROCEDURE: Informed consent was obtained from the patient following explanation of the procedure, risks, benefits and alternatives. The patient understands, agrees and consents for the procedure. All questions were addressed. A time out was performed prior to the initiation of the procedure.  Maximal barrier sterile technique utilized including caps, mask, sterile gowns, sterile gloves, large sterile drape, hand hygiene, and Betadine prep. The  right femoral head was marked fluoroscopically. Under sterile conditions and local anesthesia, the right common femoral artery access was performed with a micropuncture needle. Under direct ultrasound guidance, the right common femoral was accessed with a micropuncture kit. An ultrasound image was saved for documentation purposes. This allowed for placement of a 5-French vascular sheath. A limited arteriogram was performed through the side arm of the sheath confirming appropriate access within the right common femoral artery. The 5-French C2 catheter was utilized to select the contralateral left internal iliac artery. Selective left internal iliac angiogram was performed. The tortuous left uterine artery was identified. Selective catheterization was performed of the left uterine artery with a microcatheter and micro guide wire. A selective left uterine angiogram was performed. This demonstrated patency of the left uterine artery. Mild diffuse hypervascularity of the enlarged fibroid uterus. Access was adequate for embolization. For embolization, 3 vials of 500 - 700 micron and 2 vials of 700-900 micron Embospheres were injected into the left uterine artery. Post embolization angiogram confirms complete stasis of the left uterine vascular territory. Microcatheter was removed. The C2 catheter was retracted and utilized to select the right internal iliac artery. Selective right internal iliac angiogram was performed. The patent right uterine artery was identified. For selective catheterization, the micro catheter and guidewire were utilized to select the right uterine artery. Selective right uterine angiogram was performed. This demonstrated patency of the right uterine artery. Catheter position was safe for embolization. Embolization was performed to complete stasis with injection of 2 vials of 500-700 micron Embospheres. Post embolization angiogram confirms complete stasis of the right uterine vascular territory. At  this point, all wires, catheters and sheaths were removed from the patient. Hemostasis was achieved at the right groin access site with The patient tolerated the procedure well without immediate post procedural complication. FINDINGS: Conventional branching pattern of the bilateral internal iliac arteries. The bilateral uterine artery origins were noted to be markedly tortuous though lobe both vessels are widely patent supplying a markedly enlarged and myomatous uterus. Completion arteriograms following bilateral uterine artery particle embolization demonstrates a technically excellent result with stasis of flow within the bilateral uterine vascular territories. IMPRESSION: Successful bilateral uterine artery embolization ([facility]). Electronically Signed   By: [name] M.D.   On: 08/13/2015 12:33   Ir Angiogram Selective Each Additional Vessel  08/13/2015  INDICATION: Symptomatic uterine fibroids. Patient with persistent uterine daily refractory uterine bleeding. EXAM: 1. SELECTIVE BILATERAL INTERNAL ILIAC ARTERIOGRAM 2. SELECTIVE BILATERAL ANTERIOR DIVISION INTERNAL ILIAC ARTERIOGRAM 3. SELECTIVE BILATERAL UTERINE ARTERY ARTERIOGRAM AND PERCUTANEOUS PARTICLE EMBOLIZATION. 4. ULTRASOUND GUIDANCE FOR ARTERIAL ACCESS MEDICATIONS: Zofran 4 mg IV, Vancomycin 1.5 g IV; The antibiotic was administered within one hour of the procedure ANESTHESIA/SEDATION: Moderate (conscious) sedation was employed during this procedure. A total of Toradol 30 mg, Versed 6 mg and Fentanyl 300 mcg was administered intravenously. Moderate Sedation Time: 120 minutes. The patient's level of consciousness and vital signs were monitored continuously by radiology nursing throughout the procedure under my direct supervision. CONTRAST:  276m OMNIPAQUE IOHEXOL 300 MG/ML  SOLN FLUOROSCOPY TIME:  38 minutes 24 seconds (42,831mGy) COMPLICATIONS: None immediate. PROCEDURE: Informed consent was obtained from the patient following explanation of the  procedure, risks, benefits and alternatives. The patient understands, agrees and consents for the procedure. All questions were addressed. A time out was performed prior to the initiation  of the procedure. Maximal barrier sterile technique utilized including caps, mask, sterile gowns, sterile gloves, large sterile drape, hand hygiene, and Betadine prep. The right femoral head was marked fluoroscopically. Under sterile conditions and local anesthesia, the right common femoral artery access was performed with a micropuncture needle. Under direct ultrasound guidance, the right common femoral was accessed with a micropuncture kit. An ultrasound image was saved for documentation purposes. This allowed for placement of a 5-French vascular sheath. A limited arteriogram was performed through the side arm of the sheath confirming appropriate access within the right common femoral artery. The 5-French C2 catheter was utilized to select the contralateral left internal iliac artery. Selective left internal iliac angiogram was performed. The tortuous left uterine artery was identified. Selective catheterization was performed of the left uterine artery with a microcatheter and micro guide wire. A selective left uterine angiogram was performed. This demonstrated patency of the left uterine artery. Mild diffuse hypervascularity of the enlarged fibroid uterus. Access was adequate for embolization. For embolization, 3 vials of 500 - 700 micron and 2 vials of 700-900 micron Embospheres were injected into the left uterine artery. Post embolization angiogram confirms complete stasis of the left uterine vascular territory. Microcatheter was removed. The C2 catheter was retracted and utilized to select the right internal iliac artery. Selective right internal iliac angiogram was performed. The patent right uterine artery was identified. For selective catheterization, the micro catheter and guidewire were utilized to select the right  uterine artery. Selective right uterine angiogram was performed. This demonstrated patency of the right uterine artery. Catheter position was safe for embolization. Embolization was performed to complete stasis with injection of 2 vials of 500-700 micron Embospheres. Post embolization angiogram confirms complete stasis of the right uterine vascular territory. At this point, all wires, catheters and sheaths were removed from the patient. Hemostasis was achieved at the right groin access site with The patient tolerated the procedure well without immediate post procedural complication. FINDINGS: Conventional branching pattern of the bilateral internal iliac arteries. The bilateral uterine artery origins were noted to be markedly tortuous though lobe both vessels are widely patent supplying a markedly enlarged and myomatous uterus. Completion arteriograms following bilateral uterine artery particle embolization demonstrates a technically excellent result with stasis of flow within the bilateral uterine vascular territories. IMPRESSION: Successful bilateral uterine artery embolization ([facility]). Electronically Signed   By: [name] M.D.   On: 08/13/2015 12:33   Ir Angiogram Selective Each Additional Vessel  08/13/2015  INDICATION: Symptomatic uterine fibroids. Patient with persistent uterine daily refractory uterine bleeding. EXAM: 1. SELECTIVE BILATERAL INTERNAL ILIAC ARTERIOGRAM 2. SELECTIVE BILATERAL ANTERIOR DIVISION INTERNAL ILIAC ARTERIOGRAM 3. SELECTIVE BILATERAL UTERINE ARTERY ARTERIOGRAM AND PERCUTANEOUS PARTICLE EMBOLIZATION. 4. ULTRASOUND GUIDANCE FOR ARTERIAL ACCESS MEDICATIONS: Zofran 4 mg IV, Vancomycin 1.5 g IV; The antibiotic was administered within one hour of the procedure ANESTHESIA/SEDATION: Moderate (conscious) sedation was employed during this procedure. A total of Toradol 30 mg, Versed 6 mg and Fentanyl 300 mcg was administered intravenously. Moderate Sedation Time: 120 minutes. The patient's  level of consciousness and vital signs were monitored continuously by radiology nursing throughout the procedure under my direct supervision. CONTRAST:  259m OMNIPAQUE IOHEXOL 300 MG/ML  SOLN FLUOROSCOPY TIME:  38 minutes 24 seconds (46,294mGy) COMPLICATIONS: None immediate. PROCEDURE: Informed consent was obtained from the patient following explanation of the procedure, risks, benefits and alternatives. The patient understands, agrees and consents for the procedure. All questions were addressed. A time out was performed prior to  the initiation of the procedure. Maximal barrier sterile technique utilized including caps, mask, sterile gowns, sterile gloves, large sterile drape, hand hygiene, and Betadine prep. The right femoral head was marked fluoroscopically. Under sterile conditions and local anesthesia, the right common femoral artery access was performed with a micropuncture needle. Under direct ultrasound guidance, the right common femoral was accessed with a micropuncture kit. An ultrasound image was saved for documentation purposes. This allowed for placement of a 5-French vascular sheath. A limited arteriogram was performed through the side arm of the sheath confirming appropriate access within the right common femoral artery. The 5-French C2 catheter was utilized to select the contralateral left internal iliac artery. Selective left internal iliac angiogram was performed. The tortuous left uterine artery was identified. Selective catheterization was performed of the left uterine artery with a microcatheter and micro guide wire. A selective left uterine angiogram was performed. This demonstrated patency of the left uterine artery. Mild diffuse hypervascularity of the enlarged fibroid uterus. Access was adequate for embolization. For embolization, 3 vials of 500 - 700 micron and 2 vials of 700-900 micron Embospheres were injected into the left uterine artery. Post embolization angiogram confirms complete  stasis of the left uterine vascular territory. Microcatheter was removed. The C2 catheter was retracted and utilized to select the right internal iliac artery. Selective right internal iliac angiogram was performed. The patent right uterine artery was identified. For selective catheterization, the micro catheter and guidewire were utilized to select the right uterine artery. Selective right uterine angiogram was performed. This demonstrated patency of the right uterine artery. Catheter position was safe for embolization. Embolization was performed to complete stasis with injection of 2 vials of 500-700 micron Embospheres. Post embolization angiogram confirms complete stasis of the right uterine vascular territory. At this point, all wires, catheters and sheaths were removed from the patient. Hemostasis was achieved at the right groin access site with The patient tolerated the procedure well without immediate post procedural complication. FINDINGS: Conventional branching pattern of the bilateral internal iliac arteries. The bilateral uterine artery origins were noted to be markedly tortuous though lobe both vessels are widely patent supplying a markedly enlarged and myomatous uterus. Completion arteriograms following bilateral uterine artery particle embolization demonstrates a technically excellent result with stasis of flow within the bilateral uterine vascular territories. IMPRESSION: Successful bilateral uterine artery embolization ([facility]). Electronically Signed   By: [name] M.D.   On: 08/13/2015 12:33   Ir Angiogram Follow Up Study  08/13/2015  INDICATION: Symptomatic uterine fibroids. Patient with persistent uterine daily refractory uterine bleeding. EXAM: 1. SELECTIVE BILATERAL INTERNAL ILIAC ARTERIOGRAM 2. SELECTIVE BILATERAL ANTERIOR DIVISION INTERNAL ILIAC ARTERIOGRAM 3. SELECTIVE BILATERAL UTERINE ARTERY ARTERIOGRAM AND PERCUTANEOUS PARTICLE EMBOLIZATION. 4. ULTRASOUND GUIDANCE FOR ARTERIAL ACCESS  MEDICATIONS: Zofran 4 mg IV, Vancomycin 1.5 g IV; The antibiotic was administered within one hour of the procedure ANESTHESIA/SEDATION: Moderate (conscious) sedation was employed during this procedure. A total of Toradol 30 mg, Versed 6 mg and Fentanyl 300 mcg was administered intravenously. Moderate Sedation Time: 120 minutes. The patient's level of consciousness and vital signs were monitored continuously by radiology nursing throughout the procedure under my direct supervision. CONTRAST:  219m OMNIPAQUE IOHEXOL 300 MG/ML  SOLN FLUOROSCOPY TIME:  38 minutes 24 seconds (43,295mGy) COMPLICATIONS: None immediate. PROCEDURE: Informed consent was obtained from the patient following explanation of the procedure, risks, benefits and alternatives. The patient understands, agrees and consents for the procedure. All questions were addressed. A time out was performed prior  to the initiation of the procedure. Maximal barrier sterile technique utilized including caps, mask, sterile gowns, sterile gloves, large sterile drape, hand hygiene, and Betadine prep. The right femoral head was marked fluoroscopically. Under sterile conditions and local anesthesia, the right common femoral artery access was performed with a micropuncture needle. Under direct ultrasound guidance, the right common femoral was accessed with a micropuncture kit. An ultrasound image was saved for documentation purposes. This allowed for placement of a 5-French vascular sheath. A limited arteriogram was performed through the side arm of the sheath confirming appropriate access within the right common femoral artery. The 5-French C2 catheter was utilized to select the contralateral left internal iliac artery. Selective left internal iliac angiogram was performed. The tortuous left uterine artery was identified. Selective catheterization was performed of the left uterine artery with a microcatheter and micro guide wire. A selective left uterine angiogram was  performed. This demonstrated patency of the left uterine artery. Mild diffuse hypervascularity of the enlarged fibroid uterus. Access was adequate for embolization. For embolization, 3 vials of 500 - 700 micron and 2 vials of 700-900 micron Embospheres were injected into the left uterine artery. Post embolization angiogram confirms complete stasis of the left uterine vascular territory. Microcatheter was removed. The C2 catheter was retracted and utilized to select the right internal iliac artery. Selective right internal iliac angiogram was performed. The patent right uterine artery was identified. For selective catheterization, the micro catheter and guidewire were utilized to select the right uterine artery. Selective right uterine angiogram was performed. This demonstrated patency of the right uterine artery. Catheter position was safe for embolization. Embolization was performed to complete stasis with injection of 2 vials of 500-700 micron Embospheres. Post embolization angiogram confirms complete stasis of the right uterine vascular territory. At this point, all wires, catheters and sheaths were removed from the patient. Hemostasis was achieved at the right groin access site with The patient tolerated the procedure well without immediate post procedural complication. FINDINGS: Conventional branching pattern of the bilateral internal iliac arteries. The bilateral uterine artery origins were noted to be markedly tortuous though lobe both vessels are widely patent supplying a markedly enlarged and myomatous uterus. Completion arteriograms following bilateral uterine artery particle embolization demonstrates a technically excellent result with stasis of flow within the bilateral uterine vascular territories. IMPRESSION: Successful bilateral uterine artery embolization ([facility]). Electronically Signed   By: [name] M.D.   On: 08/13/2015 12:33   Ir US Guide Vasc Access Right  08/13/2015  INDICATION:  Symptomatic uterine fibroids. Patient with persistent uterine daily refractory uterine bleeding. EXAM: 1. SELECTIVE BILATERAL INTERNAL ILIAC ARTERIOGRAM 2. SELECTIVE BILATERAL ANTERIOR DIVISION INTERNAL ILIAC ARTERIOGRAM 3. SELECTIVE BILATERAL UTERINE ARTERY ARTERIOGRAM AND PERCUTANEOUS PARTICLE EMBOLIZATION. 4. ULTRASOUND GUIDANCE FOR ARTERIAL ACCESS MEDICATIONS: Zofran 4 mg IV, Vancomycin 1.5 g IV; The antibiotic was administered within one hour of the procedure ANESTHESIA/SEDATION: Moderate (conscious) sedation was employed during this procedure. A total of Toradol 30 mg, Versed 6 mg and Fentanyl 300 mcg was administered intravenously. Moderate Sedation Time: 120 minutes. The patient's level of consciousness and vital signs were monitored continuously by radiology nursing throughout the procedure under my direct supervision. CONTRAST:  262m OMNIPAQUE IOHEXOL 300 MG/ML  SOLN FLUOROSCOPY TIME:  38 minutes 24 seconds (45,188mGy) COMPLICATIONS: None immediate. PROCEDURE: Informed consent was obtained from the patient following explanation of the procedure, risks, benefits and alternatives. The patient understands, agrees and consents for the procedure. All questions were addressed. A time out was  performed prior to the initiation of the procedure. Maximal barrier sterile technique utilized including caps, mask, sterile gowns, sterile gloves, large sterile drape, hand hygiene, and Betadine prep. The right femoral head was marked fluoroscopically. Under sterile conditions and local anesthesia, the right common femoral artery access was performed with a micropuncture needle. Under direct ultrasound guidance, the right common femoral was accessed with a micropuncture kit. An ultrasound image was saved for documentation purposes. This allowed for placement of a 5-French vascular sheath. A limited arteriogram was performed through the side arm of the sheath confirming appropriate access within the right common femoral  artery. The 5-French C2 catheter was utilized to select the contralateral left internal iliac artery. Selective left internal iliac angiogram was performed. The tortuous left uterine artery was identified. Selective catheterization was performed of the left uterine artery with a microcatheter and micro guide wire. A selective left uterine angiogram was performed. This demonstrated patency of the left uterine artery. Mild diffuse hypervascularity of the enlarged fibroid uterus. Access was adequate for embolization. For embolization, 3 vials of 500 - 700 micron and 2 vials of 700-900 micron Embospheres were injected into the left uterine artery. Post embolization angiogram confirms complete stasis of the left uterine vascular territory. Microcatheter was removed. The C2 catheter was retracted and utilized to select the right internal iliac artery. Selective right internal iliac angiogram was performed. The patent right uterine artery was identified. For selective catheterization, the micro catheter and guidewire were utilized to select the right uterine artery. Selective right uterine angiogram was performed. This demonstrated patency of the right uterine artery. Catheter position was safe for embolization. Embolization was performed to complete stasis with injection of 2 vials of 500-700 micron Embospheres. Post embolization angiogram confirms complete stasis of the right uterine vascular territory. At this point, all wires, catheters and sheaths were removed from the patient. Hemostasis was achieved at the right groin access site with The patient tolerated the procedure well without immediate post procedural complication. FINDINGS: Conventional branching pattern of the bilateral internal iliac arteries. The bilateral uterine artery origins were noted to be markedly tortuous though lobe both vessels are widely patent supplying a markedly enlarged and myomatous uterus. Completion arteriograms following bilateral  uterine artery particle embolization demonstrates a technically excellent result with stasis of flow within the bilateral uterine vascular territories. IMPRESSION: Successful bilateral uterine artery embolization ([facility]). Electronically Signed   By: [name] M.D.   On: 08/13/2015 12:33   Dg Chest Port 1 View  08/11/2015  CLINICAL DATA:  Shortness of breath. EXAM: PORTABLE CHEST 1 VIEW COMPARISON:  01/16/2015 FINDINGS: Mild linear scarring in the right lower lobe. Lungs appear otherwise clear. Cardiac and mediastinal margins appear normal. No pleural effusion identified. IMPRESSION: 1.  No active cardiopulmonary disease is radiographically apparent. 2. Chronic scarring in the right lower lobe. Electronically Signed   By: Van Clines M.D.   On: 08/11/2015 08:03   [employer] Guide Roadmapping  08/13/2015  INDICATION: Symptomatic uterine fibroids. Patient with persistent uterine daily refractory uterine bleeding. EXAM: 1. SELECTIVE BILATERAL INTERNAL ILIAC ARTERIOGRAM 2. SELECTIVE BILATERAL ANTERIOR DIVISION INTERNAL ILIAC ARTERIOGRAM 3. SELECTIVE BILATERAL UTERINE ARTERY ARTERIOGRAM AND PERCUTANEOUS PARTICLE EMBOLIZATION. 4. ULTRASOUND GUIDANCE FOR ARTERIAL ACCESS MEDICATIONS: Zofran 4 mg IV, Vancomycin 1.5 g IV; The antibiotic was administered within one hour of the procedure ANESTHESIA/SEDATION: Moderate (conscious) sedation was employed during this procedure. A total of Toradol 30 mg, Versed 6 mg and  Fentanyl 300 mcg was administered intravenously. Moderate Sedation Time: 120 minutes. The patient's level of consciousness and vital signs were monitored continuously by radiology nursing throughout the procedure under my direct supervision. CONTRAST:  235m OMNIPAQUE IOHEXOL 300 MG/ML  SOLN FLUOROSCOPY TIME:  38 minutes 24 seconds (40,938mGy) COMPLICATIONS: None immediate. PROCEDURE: Informed consent was obtained from the patient following explanation of the procedure,  risks, benefits and alternatives. The patient understands, agrees and consents for the procedure. All questions were addressed. A time out was performed prior to the initiation of the procedure. Maximal barrier sterile technique utilized including caps, mask, sterile gowns, sterile gloves, large sterile drape, hand hygiene, and Betadine prep. The right femoral head was marked fluoroscopically. Under sterile conditions and local anesthesia, the right common femoral artery access was performed with a micropuncture needle. Under direct ultrasound guidance, the right common femoral was accessed with a micropuncture kit. An ultrasound image was saved for documentation purposes. This allowed for placement of a 5-French vascular sheath. A limited arteriogram was performed through the side arm of the sheath confirming appropriate access within the right common femoral artery. The 5-French C2 catheter was utilized to select the contralateral left internal iliac artery. Selective left internal iliac angiogram was performed. The tortuous left uterine artery was identified. Selective catheterization was performed of the left uterine artery with a microcatheter and micro guide wire. A selective left uterine angiogram was performed. This demonstrated patency of the left uterine artery. Mild diffuse hypervascularity of the enlarged fibroid uterus. Access was adequate for embolization. For embolization, 3 vials of 500 - 700 micron and 2 vials of 700-900 micron Embospheres were injected into the left uterine artery. Post embolization angiogram confirms complete stasis of the left uterine vascular territory. Microcatheter was removed. The C2 catheter was retracted and utilized to select the right internal iliac artery. Selective right internal iliac angiogram was performed. The patent right uterine artery was identified. For selective catheterization, the micro catheter and guidewire were utilized to select the right uterine artery.  Selective right uterine angiogram was performed. This demonstrated patency of the right uterine artery. Catheter position was safe for embolization. Embolization was performed to complete stasis with injection of 2 vials of 500-700 micron Embospheres. Post embolization angiogram confirms complete stasis of the right uterine vascular territory. At this point, all wires, catheters and sheaths were removed from the patient. Hemostasis was achieved at the right groin access site with The patient tolerated the procedure well without immediate post procedural complication. FINDINGS: Conventional branching pattern of the bilateral internal iliac arteries. The bilateral uterine artery origins were noted to be markedly tortuous though lobe both vessels are widely patent supplying a markedly enlarged and myomatous uterus. Completion arteriograms following bilateral uterine artery particle embolization demonstrates a technically excellent result with stasis of flow within the bilateral uterine vascular territories. IMPRESSION: Successful bilateral uterine artery embolization (U F E). Electronically Signed   By: JSandi MariscalM.D.   On: 08/13/2015 12:33    Labs:  CBC:  Recent Labs  08/12/15 0935  08/14/15 0607 08/18/15 1808 08/18/15 1817 08/19/15 0501  WBC 15.9*  --  9.6 10.7*  --  10.2  HGB 10.8*  < > 9.9* 9.8* 10.9* 9.2*  HCT 33.2*  < > 30.2* 30.9* 32.0* 28.8*  PLT 248  --  215 368  --  347  < > = values in this interval not displayed.  COAGS:  Recent Labs  08/13/15 0357 08/19/15 0147  INR 1.27 1.43  APTT  --  29    BMP:  Recent Labs  08/12/15 0757 08/14/15 0607 08/18/15 1817 08/18/15 1842 08/19/15 0501  NA 136 138 141 142 141  K 3.7 3.7 2.6* 2.5* 3.3*  CL 106 110 97* 102 106  CO2 14* 17*  --  27 23  GLUCOSE 268* 274* 270* 266* 247*  BUN <5* <5* <3* <5* <5*  CALCIUM 8.8* 8.9  --  8.0* 7.8*  CREATININE 0.83 0.64 0.50 0.66 0.64  GFRNONAA >60 >60  --  >60 >60  GFRAA >60 >60  --  >60  >60    LIVER FUNCTION TESTS:  Recent Labs  09/08/14 0147 11/09/14 0310 08/11/15 0352 08/18/15 1842  BILITOT 0.5 1.0 0.9 0.9  AST 14 57* 13* 10*  ALT 11 11* 16 11*  ALKPHOS 73 75 92 71  PROT 7.2 6.1* 6.0* 5.5*  ALBUMIN 4.0 3.1* 3.2* 2.3*    TUMOR MARKERS: No results for input(s): AFPTM, CEA, CA199, CHROMGRNA in the last 8760 hours.  Assessment and Plan:  S/P [place] by Dr. Pascal Lux on 08/13/2015  Patient seen by myself and by Dr. Barbie Banner.  Dr. Barbie Banner explained to patient and referring MD that gas within the uterus is not an unusual finding after [place].  Recommend NSAIDs for pain and use narcotics only if pain not controlled with NSAIDs.  Agree with antibiotics.  Will follow.  Electronically Signed: Murrell Redden PA-C 08/19/2015, 11:02 AM   I spent a total of  25 minutes in face to face in clinical consultation, greater than 50% of which was counseling/coordinating care for f/u after UAD with concern for infection.

## 2015-08-19 NOTE — Progress Notes (Signed)
Pharmacy Antibiotic Note  Melinda [name] is a 50 y.o. female admitted on 08/18/2015 with endometriosis/sepsis.  Pharmacy has been consulted for gentamcin dosing. Gentamicin 100 mg IV given in ED at midnight on 2/18  ~13 hr level 0.8, continue current dosing schedule  Plan: Continue Gentamicin 660 mg IV q24h Clindamycin 900 mg IV q8h as ordered  Height: 5\' 10"  (177.8 cm) Weight: 299 lb 9.6 oz (135.898 kg) IBW/kg (Calculated) : 68.5  Temp (24hrs), Avg:98.2 F (36.8 C), Min:97.6 F (36.4 C), Max:99 F (37.2 C)   Recent Labs Lab 08/14/15 0607 08/18/15 1808 08/18/15 1817 08/18/15 1842 08/19/15 0147 08/19/15 0501 08/19/15 1827  WBC 9.6 10.7*  --   --   --  10.2  --   CREATININE 0.64  --  0.50 0.66  --  0.64  --   LATICACIDVEN  --   --  1.03  --  0.7 0.7  --   GENTRANDOM  --   --   --   --   --   --  0.8    Estimated Creatinine Clearance: 131.1 mL/min (by C-G formula based on Cr of 0.64).    Allergies  Allergen Reactions  . Ceftriaxone Anaphylaxis  . Penicillins Shortness Of Breath    Has patient had a PCN reaction causing immediate rash, facial/tongue/throat swelling, SOB or lightheadedness with hypotension: Yes Has patient had a PCN reaction causing severe rash involving mucus membranes or skin necrosis: No Has patient had a PCN reaction that required hospitalization No Has patient had a PCN reaction occurring within the last 10 years: No If all of the above answers are "NO", then may proceed with Cephalosporin use.   . Shellfish Allergy Anaphylaxis  . Citrus Rash    Antimicrobials this admission: Clindamycin 2/18 >>  Gentmicin 2/18 >>    Thank you for allowing [place] to participate in this patients care. Jens Som, PharmD Pager: [phone]  08/19/2015 10:14 PM

## 2015-08-19 NOTE — Progress Notes (Signed)
Pharmacy Antibiotic Note  Melinda [name] is a 50 y.o. female admitted on 08/18/2015 with endometriosis/sepsis.  Pharmacy has been consulted for gentamcin dosing.  Gentamicin 100 mg IV given in ED at midnight  Plan: Gentamicin 660 mg IV q24h Clindamycin 900 mg IV q8h as ordered  Height: 5\' 10"  (177.8 cm) Weight: 290 lb (131.543 kg) IBW/kg (Calculated) : 68.5  Temp (24hrs), Avg:98.8 F (37.1 C), Min:98.8 F (37.1 C), Max:98.8 F (37.1 C)   Recent Labs Lab 08/12/15 0757 08/12/15 0935 08/14/15 0607 08/18/15 1808 08/18/15 1817 08/18/15 1842  WBC  --  15.9* 9.6 10.7*  --   --   CREATININE 0.83  --  0.64  --  0.50 0.66  LATICACIDVEN  --   --   --   --  1.03  --     Estimated Creatinine Clearance: 128.6 mL/min (by C-G formula based on Cr of 0.66).    Allergies  Allergen Reactions  . Ceftriaxone Anaphylaxis  . Penicillins Shortness Of Breath    Has patient had a PCN reaction causing immediate rash, facial/tongue/throat swelling, SOB or lightheadedness with hypotension: Yes Has patient had a PCN reaction causing severe rash involving mucus membranes or skin necrosis: No Has patient had a PCN reaction that required hospitalization No Has patient had a PCN reaction occurring within the last 10 years: No If all of the above answers are "NO", then may proceed with Cephalosporin use.   . Shellfish Allergy Anaphylaxis  . Citrus Rash    Antimicrobials this admission: Clindamycin 2/18 >>  Gentmicin 2/18 >>    Melinda [name] 08/19/2015 12:35 AM

## 2015-08-19 NOTE — Consult Note (Signed)
Reason for Consult: Postoperative pain status post fibroid uterus embolization by interventional radiology, admitted last night for worsening lower abdominal and upper abdominal pain Referring Physician: Francene Boyers M.D.  Melinda [name] is an 50 y.o. female. She is now 8 days status post fibroid uterus embolization by interventional radiology, who was admitted last night after presenting with increasing abdominal pain not controlled by oral medications with CT scan showing scattered air bubbles throughout the fibroid uterus raising the concern for infectious process. She has normal white count 10,200, normal lactate level, normal bowel activity without vomiting. She is currently being treated for possible infectious process with gentamicin and clindamycin  Pertinent Gynecological History: Menses: flow is excessive with use of Both pads or tampons on heaviest days, prior to the embolization Bleeding: Anemia secondary to heavy menses Contraception:  DES exposure:  Blood transfusions: none Sexually transmitted diseases: no past history Previous GYN Procedures: Fibroid embolization 08/13/2015  Last mammogram:  Date:  Last pap:  Date:  OB History: G, P   Menstrual History: Menarche age:  Patient's last menstrual period was 08/11/2015.    Past Medical History  Diagnosis Date  . Migraine   . PTSD (post-traumatic stress disorder)   . Asthma   . GERD (gastroesophageal reflux disease)   . Scoliosis   . Arthritis   . ADHD (attention deficit hyperactivity disorder)   . Peripheral neuropathy ([place])   . Iron deficiency anemia due to chronic blood loss 09/09/2014  . Hyperglycemia 09/09/2014  . Low TSH level 09/08/2014  . Menorrhagia 09/08/2014  . Ovarian mass, right 09/09/2014  . PE (pulmonary embolism)   . Lumbar herniated disc   . Multiple thyroid nodules     Past Surgical History  Procedure Laterality Date  . Cholecystectomy      Family History  Problem Relation Age of Onset  . Diabetes  Mother   . Breast cancer Mother   . CAD Mother   . Hypertension Mother   . Alcohol abuse Father   . Hypertension Father     Social History:  reports that she quit smoking about 9 months ago. Her smoking use included Cigarettes. She has a 20 pack-year smoking history. She has never used smokeless tobacco. She reports that she uses illicit drugs (Methamphetamines). She reports that she does not drink alcohol.  Allergies:  Allergies  Allergen Reactions  . Ceftriaxone Anaphylaxis  . Penicillins Shortness Of Breath    Has patient had a PCN reaction causing immediate rash, facial/tongue/throat swelling, SOB or lightheadedness with hypotension: Yes Has patient had a PCN reaction causing severe rash involving mucus membranes or skin necrosis: No Has patient had a PCN reaction that required hospitalization No Has patient had a PCN reaction occurring within the last 10 years: No If all of the above answers are "NO", then may proceed with Cephalosporin use.   . Shellfish Allergy Anaphylaxis  . Citrus Rash    Medications: I have reviewed the patient's current medications.  ROS  Blood pressure 155/80, pulse 80, temperature 98.1 F (36.7 C), temperature source Oral, resp. rate 18, height _0  (1.778 m), weight 135.898 kg (299 lb 9.6 oz), last menstrual period 08/11/2015, SpO2 98 %. Physical Exam  Constitutional: She is oriented to person, place, and time. She appears well-nourished.  Morbidly obese BMI 41 height 510 weight 290  Cardiovascular: Normal rate.   Respiratory: Effort normal.  GI: Soft. There is tenderness. There is guarding.  No rebound tenderness  Genitourinary: Vagina normal.  Musculoskeletal: Normal range of  motion.  Neurological: She is alert and oriented to person, place, and time. She has normal reflexes.  Skin: Skin is warm and dry.  Psychiatric: She has a normal mood and affect. Her behavior is normal. Thought content normal.    Results for orders placed or  performed during the hospital encounter of 08/18/15 (from the past 48 hour(s))  CBC with Differential ([facility])     Status: Abnormal   Collection Time: 08/18/15  6:08 PM  Result Value Ref Range   WBC 10.7 (H) 4.0 - 10.5 K/uL   RBC 3.77 (L) 3.87 - 5.11 MIL/uL   Hemoglobin 9.8 (L) 12.0 - 15.0 g/dL   HCT 30.9 (L) 36.0 - 46.0 %   MCV 82.0 78.0 - 100.0 fL   MCH 26.0 26.0 - 34.0 pg   MCHC 31.7 30.0 - 36.0 g/dL   RDW 17.4 (H) 11.5 - 15.5 %   Platelets 368 150 - 400 K/uL   Neutrophils Relative % 72 %   Neutro Abs 7.7 1.7 - 7.7 K/uL   Lymphocytes Relative 14 %   Lymphs Abs 1.4 0.7 - 4.0 K/uL   Monocytes Relative 14 %   Monocytes Absolute 1.4 (H) 0.1 - 1.0 K/uL   Eosinophils Relative 0 %   Eosinophils Absolute 0.0 0.0 - 0.7 K/uL   Basophils Relative 0 %   Basophils Absolute 0.0 0.0 - 0.1 K/uL  I-Stat Chem 8, ED  (not at [facility])     Status: Abnormal   Collection Time: 08/18/15  6:17 PM  Result Value Ref Range   Sodium 141 135 - 145 mmol/L   Potassium 2.6 (LL) 3.5 - 5.1 mmol/L   Chloride 97 (L) 101 - 111 mmol/L   BUN <3 (L) 6 - 20 mg/dL   Creatinine, Ser 0.50 0.44 - 1.00 mg/dL   Glucose, Bld 270 (H) 65 - 99 mg/dL   Calcium, Ion 1.01 (L) 1.12 - 1.23 mmol/L   TCO2 28 0 - 100 mmol/L   Hemoglobin 10.9 (L) 12.0 - 15.0 g/dL   HCT 32.0 (L) 36.0 - 46.0 %  I-Stat Venous Blood Gas, ED  ([facility], [facility])     Status: Abnormal   Collection Time: 08/18/15  6:17 PM  Result Value Ref Range   pH, Ven 7.488 (H) 7.250 - 7.300   pCO2, Ven 39.5 (L) 45.0 - 50.0 mmHg   pO2, Ven 54.0 (H) 30.0 - 45.0 mmHg   Bicarbonate 29.9 (H) 20.0 - 24.0 mEq/L   TCO2 31 0 - 100 mmol/L   O2 Saturation 90.0 %   Acid-Base Excess 6.0 (H) 0.0 - 2.0 mmol/L   Patient temperature HIDE    Sample type VENOUS   I-Stat CG4 Lactic Acid, ED     Status: None   Collection Time: 08/18/15  6:17 PM  Result Value Ref Range   Lactic Acid, Venous 1.03 0.5 - 2.0 mmol/L  Comprehensive metabolic panel     Status: Abnormal   Collection Time:  08/18/15  6:42 PM  Result Value Ref Range   Sodium 142 135 - 145 mmol/L   Potassium 2.5 (LL) 3.5 - 5.1 mmol/L    Comment: CRITICAL RESULT CALLED TO, READ BACK BY AND VERIFIED WITH: Adrian Saran 1925 08/18/15 D BRADLEY    Chloride 102 101 - 111 mmol/L   CO2 27 22 - 32 mmol/L   Glucose, Bld 266 (H) 65 - 99 mg/dL   BUN <5 (L) 6 - 20 mg/dL   Creatinine, Ser 0.66 0.44 - 1.00  mg/dL   Calcium 8.0 (L) 8.9 - 10.3 mg/dL   Total Protein 5.5 (L) 6.5 - 8.1 g/dL   Albumin 2.3 (L) 3.5 - 5.0 g/dL   AST 10 (L) 15 - 41 U/L   ALT 11 (L) 14 - 54 U/L   Alkaline Phosphatase 71 38 - 126 U/L   Total Bilirubin 0.9 0.3 - 1.2 mg/dL   GFR calc non Af Amer >60 >60 mL/min   GFR calc Af Amer >60 >60 mL/min    Comment: (NOTE) The eGFR has been calculated using the CKD EPI equation. This calculation has not been validated in all clinical situations. eGFR's persistently <60 mL/min signify possible Chronic Kidney Disease.    Anion gap 13 5 - 15  Lipase, blood     Status: None   Collection Time: 08/18/15  6:42 PM  Result Value Ref Range   Lipase 24 11 - 51 U/L  POC Urine Pregnancy, ED  (not at [facility])     Status: None   Collection Time: 08/18/15  8:18 PM  Result Value Ref Range   Preg Test, Ur NEGATIVE NEGATIVE    Comment:        THE SENSITIVITY OF THIS METHODOLOGY IS >24 mIU/mL   Urinalysis, Routine w reflex microscopic (not at [facility])     Status: Abnormal   Collection Time: 08/18/15  8:25 PM  Result Value Ref Range   Color, Urine YELLOW YELLOW   APPearance HAZY (A) CLEAR   Specific Gravity, Urine 1.026 1.005 - 1.030   pH 6.5 5.0 - 8.0   Glucose, UA >1000 (A) NEGATIVE mg/dL   Hgb urine dipstick LARGE (A) NEGATIVE   Bilirubin Urine SMALL (A) NEGATIVE   Ketones, ur >80 (A) NEGATIVE mg/dL   Protein, ur 30 (A) NEGATIVE mg/dL   Nitrite NEGATIVE NEGATIVE   Leukocytes, UA NEGATIVE NEGATIVE  Urine microscopic-add on     Status: Abnormal   Collection Time: 08/18/15  8:25 PM  Result Value Ref Range   Squamous  Epithelial / LPF 0-5 (A) NONE SEEN   WBC, UA 6-30 0 - 5 WBC/hpf   RBC / HPF 6-30 0 - 5 RBC/hpf   Bacteria, UA FEW (A) NONE SEEN   Urine-Other MUCOUS PRESENT   Lactic acid, plasma     Status: None   Collection Time: 08/19/15  1:47 AM  Result Value Ref Range   Lactic Acid, Venous 0.7 0.5 - 2.0 mmol/L  Procalcitonin     Status: None   Collection Time: 08/19/15  1:47 AM  Result Value Ref Range   Procalcitonin 1.75 ng/mL    Comment:        Interpretation: PCT > 0.5 ng/mL and <= 2 ng/mL: Systemic infection (sepsis) is possible, but other conditions are known to elevate PCT as well. (NOTE)         ICU PCT Algorithm               Non ICU PCT Algorithm    ----------------------------     ------------------------------         PCT < 0.25 ng/mL                 PCT < 0.1 ng/mL     Stopping of antibiotics            Stopping of antibiotics       strongly encouraged.               strongly encouraged.    ----------------------------     ------------------------------  PCT level decrease by               PCT < 0.25 ng/mL       >= 80% from peak PCT       OR PCT 0.25 - 0.5 ng/mL          Stopping of antibiotics                                             encouraged.     Stopping of antibiotics           encouraged.    ----------------------------     ------------------------------       PCT level decrease by              PCT >= 0.25 ng/mL       < 80% from peak PCT        AND PCT >= 0.5 ng/mL             Continuing antibiotics                                              encouraged.       Continuing antibiotics            encouraged.    ----------------------------     ------------------------------     PCT level increase compared          PCT > 0.5 ng/mL         with peak PCT AND          PCT >= 0.5 ng/mL             Escalation of antibiotics                                          strongly encouraged.      Escalation of antibiotics        strongly encouraged.   APTT     Status:  None   Collection Time: 08/19/15  1:47 AM  Result Value Ref Range   aPTT 29 24 - 37 seconds  Protime-INR     Status: Abnormal   Collection Time: 08/19/15  1:47 AM  Result Value Ref Range   Prothrombin Time 17.5 (H) 11.6 - 15.2 seconds   INR 1.43 0.00 - 1.49  Magnesium     Status: Abnormal   Collection Time: 08/19/15  1:47 AM  Result Value Ref Range   Magnesium 1.4 (L) 1.7 - 2.4 mg/dL  Type and screen [place]     Status: None   Collection Time: 08/19/15  1:47 AM  Result Value Ref Range   ABO/RH(D) O POS    Antibody Screen NEG    Sample Expiration 08/22/2015   ABO/Rh     Status: None (Preliminary result)   Collection Time: 08/19/15  1:47 AM  Result Value Ref Range   ABO/RH(D) O POS   Lactic acid, plasma     Status: None   Collection Time: 08/19/15  5:01 AM  Result Value Ref Range   Lactic Acid, Venous 0.7 0.5 - 2.0 mmol/L  CBC WITH DIFFERENTIAL  Status: Abnormal   Collection Time: 08/19/15  5:01 AM  Result Value Ref Range   WBC 10.2 4.0 - 10.5 K/uL   RBC 3.47 (L) 3.87 - 5.11 MIL/uL   Hemoglobin 9.2 (L) 12.0 - 15.0 g/dL   HCT 28.8 (L) 36.0 - 46.0 %   MCV 83.0 78.0 - 100.0 fL   MCH 26.5 26.0 - 34.0 pg   MCHC 31.9 30.0 - 36.0 g/dL   RDW 17.7 (H) 11.5 - 15.5 %   Platelets 347 150 - 400 K/uL   Neutrophils Relative % 70 %   Neutro Abs 7.2 1.7 - 7.7 K/uL   Lymphocytes Relative 17 %   Lymphs Abs 1.7 0.7 - 4.0 K/uL   Monocytes Relative 12 %   Monocytes Absolute 1.2 (H) 0.1 - 1.0 K/uL   Eosinophils Relative 1 %   Eosinophils Absolute 0.1 0.0 - 0.7 K/uL   Basophils Relative 0 %   Basophils Absolute 0.0 0.0 - 0.1 K/uL  Basic metabolic panel     Status: Abnormal   Collection Time: 08/19/15  5:01 AM  Result Value Ref Range   Sodium 141 135 - 145 mmol/L   Potassium 3.3 (L) 3.5 - 5.1 mmol/L    Comment: DELTA CHECK NOTED   Chloride 106 101 - 111 mmol/L   CO2 23 22 - 32 mmol/L   Glucose, Bld 247 (H) 65 - 99 mg/dL   BUN <5 (L) 6 - 20 mg/dL   Creatinine,  Ser 0.64 0.44 - 1.00 mg/dL   Calcium 7.8 (L) 8.9 - 10.3 mg/dL   GFR calc non Af Amer >60 >60 mL/min   GFR calc Af Amer >60 >60 mL/min    Comment: (NOTE) The eGFR has been calculated using the CKD EPI equation. This calculation has not been validated in all clinical situations. eGFR's persistently <60 mL/min signify possible Chronic Kidney Disease.    Anion gap 12 5 - 15  Glucose, capillary     Status: Abnormal   Collection Time: 08/19/15  6:30 AM  Result Value Ref Range   Glucose-Capillary 223 (H) 65 - 99 mg/dL    Dg Chest 2 View  08/18/2015  CLINICAL DATA:  Chest pain for 1 day. Status post uterine fibroid resection yesterday. EXAM: CHEST  2 VIEW COMPARISON:  08/11/2015 and chest CTA dated 08/12/2015. FINDINGS: Normal sized heart. Clear lungs. Skin fold overlying the medial right lung base. No pneumothorax. No pleural fluid. Mild scoliosis. IMPRESSION: No acute abnormality. Electronically Signed   By: Claudie Revering M.D.   On: 08/18/2015 18:21   Ct Abdomen Pelvis W Contrast  08/18/2015  CLINICAL DATA:  Diffuse abdominal pain, including upper abdominal pain and pelvic pain. Uterine fibroids were removed on 08/13/2015. EXAM: CT ABDOMEN AND PELVIS WITH CONTRAST TECHNIQUE: Multidetector CT imaging of the abdomen and pelvis was performed using the standard protocol following bolus administration of intravenous contrast. CONTRAST:  158m OMNIPAQUE IOHEXOL 300 MG/ML  SOLN COMPARISON:  None. FINDINGS: Mild focal scarring in the lung bases. Mild diffuse fatty infiltration of the liver. There is a focal circumscribed lesion in segment 4 of the liver measuring 2.1 cm diameter. The pattern is not typical for focal fatty infiltration in this may represent focal liver lesion. Follow-up with MRI is suggested. There is a right suprarenal mass lesion, likely arising from the adrenal gland and measuring 5.7 by 4.6 cm. The mass is low-attenuation in contains focal fat and focal calcification. The lesion is likely  to represent a myelolipoma based on  the presence of macroscopic fat. This could also be evaluated at MRI. The gallbladder is surgically absent. The pancreas, spleen, left adrenal gland, kidneys, abdominal aorta, inferior vena cava, and retroperitoneal lymph nodes are unremarkable. Stomach, small bowel, and colon are decompressed. No free air or free fluid in the abdomen. Abdominal wall musculature appears intact. Pelvis: There is a large uterine mass containing heterogeneous density and gas. This is measuring about 12.8 x 14.1 x 10.9 cm. There has been recent uterine artery embolization in this may represent changes due to necrosis of the fibroid after embolization. Presence of gas does raise suspicion for infection. Endometritis is a known complication following uterine artery embolization and may be present. The appendix is normal. No free or loculated pelvic fluid collections. No pelvic lymphadenopathy. Bladder wall is not thickened. Degenerative changes in the lumbar spine with degenerative disc disease at L5-S1. No destructive bone lesions. IMPRESSION: 1. 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 There is gas present within the area of necrosis suggesting possible superinfection and endometritis. 2. Indeterminate circumscribed lesion in segment 4 of the liver, possibly focal fatty infiltration but appearance is not typical. Consider follow-up with elective MRI. 3. Heterogeneous right adrenal gland mass containing macroscopic fat and calcification. 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 This could also be evaluated at the time of MRI. Electronically Signed   By: Lucienne Capers M.D.   On: 08/18/2015 21:48    Assessment/Plan: 1 post embolization pain syndrome day 8 status post fibroid embolization of uterus 2. Scattered air in the fibroid uterus worrisome for infectious process status post embolization but normal white  count and normal lactate encouraging. Agree with IV antibiotic coverage at present, await blood cultures Interventional radiology consult placed, IR contacted by phone 3. Would allow patient to eat,, continue IV antibiotics. At present not considering surgical intervention in the absence worsening clinical conditions Adrian Dinovo V 08/19/2015

## 2015-08-19 NOTE — Progress Notes (Signed)
[facility] PROGRESS NOTE   Melinda [name] [id number] DOB: December 12, 1965 DOA: 08/18/2015 PCP: Minerva Ends, MD  HPI/Subjective: Having some pain, she was asking for food. Seen by OB/GYN and IR in both recommended to continue antibiotics.  Assessment/Plan: Principal Problem:   Sepsis, unspecified organism ([facility]) Active Problems:   Iron deficiency anemia due to chronic blood loss   Sinus tachycardia (HCC)   Morbid obesity (HCC)   Asthma   Abnormal uterine bleeding (AUB)   Endometritis   Insulin dependent diabetes mellitus (HCC)   Hypokalemia   Prolonged Q-T interval on ECG   This is a no charge note, patient seen earlier today by my colleague Dr. Myna Hidalgo. Patient seen and examined, data base reviewed. Presented with abdominal pain, status post uterine artery embolization done on 08/13/2015 by Dr. Pascal Lux of IR. Has intrauterine gas bubbles concerning for infection, discussed with IR, gas bubbles after such procedures is not unusual secondary to nitrogen. Continue antibiotics no intervention. Hypokalemia and hypomagnesemia replete with oral and parenteral supplements respectively.  Code Status: Full Code Family Communication: Plan discussed with the patient. Disposition Plan: Remains inpatient Diet: Diet Carb Modified Fluid consistency:: Thin; Room service appropriate?: Yes  Consultants:  OB/GYN  Interventional radiology  Procedures:  None  Antibiotics:  Clindamycin and gent (indicate start date, and stop date if known)   Objective: Filed Vitals:   08/19/15 0055 08/19/15 0500  BP:  155/80  Pulse: 107 80  Temp: 97.7 F (36.5 C) 98.1 F (36.7 C)  Resp: 18 18    Intake/Output Summary (Last 24 hours) at 08/19/15 1156 Last data filed at 08/19/15 0851  Gross per 24 hour  Intake   1350 ml  Output   1125 ml  Net    225 ml   Filed Weights   08/18/15 1712 08/19/15 0055  Weight: 131.543 kg (290 lb) 135.898 kg (299 lb 9.6 oz)    Exam: General: Alert  and awake, oriented x3, not in any acute distress. HEENT: anicteric sclera, pupils reactive to light and accommodation, EOMI CVS: S1-S2 clear, no murmur rubs or gallops Chest: clear to auscultation bilaterally, no wheezing, rales or rhonchi Abdomen: soft nontender, nondistended, normal bowel sounds, no organomegaly Extremities: no cyanosis, clubbing or edema noted bilaterally Neuro: Cranial nerves II-XII intact, no focal neurological deficits  Data Reviewed: Basic Metabolic Panel:  Recent Labs Lab 08/13/15 0357 08/13/15 1233 08/14/15 0607 08/18/15 1817 08/18/15 1842 08/19/15 0147 08/19/15 0501  NA  --   --  138 141 142  --  141  K  --   --  3.7 2.6* 2.5*  --  3.3*  CL  --   --  110 97* 102  --  106  CO2  --   --  17*  --  27  --  23  GLUCOSE  --   --  274* 270* 266*  --  247*  BUN  --   --  <5* <3* <5*  --  <5*  CREATININE  --   --  0.64 0.50 0.66  --  0.64  CALCIUM  --   --  8.9  --  8.0*  --  7.8*  MG 1.7 1.6*  --   --   --  1.4*  --    Liver Function Tests:  Recent Labs Lab 08/18/15 1842  AST 10*  ALT 11*  ALKPHOS 71  BILITOT 0.9  PROT 5.5*  ALBUMIN 2.3*    Recent Labs Lab 08/18/15 1842  LIPASE 24  No results for input(s): AMMONIA in the last 168 hours. CBC:  Recent Labs Lab 08/13/15 0357 08/14/15 0607 08/18/15 1808 08/18/15 1817 08/19/15 0501  WBC  --  9.6 10.7*  --  10.2  NEUTROABS  --   --  7.7  --  7.2  HGB 10.0* 9.9* 9.8* 10.9* 9.2*  HCT 30.9* 30.2* 30.9* 32.0* 28.8*  MCV  --  84.6 82.0  --  83.0  PLT  --  215 368  --  347   Cardiac Enzymes: No results for input(s): CKTOTAL, CKMB, CKMBINDEX, TROPONINI in the last 168 hours. BNP (last 3 results)  Recent Labs  09/08/14 1345  BNP 192.8*    ProBNP (last 3 results) No results for input(s): PROBNP in the last 8760 hours.  CBG:  Recent Labs Lab 08/13/15 1709 08/13/15 2053 08/14/15 0815 08/14/15 1219 08/19/15 0630  GLUCAP 213* 258* 250* 305* 223*    Micro Recent Results (from  the past 240 hour(s))  MRSA PCR Screening     Status: None   Collection Time: 08/11/15  7:03 AM  Result Value Ref Range Status   MRSA by PCR NEGATIVE NEGATIVE Final    Comment:        The GeneXpert MRSA Assay (FDA approved for NASAL specimens only), is one component of a comprehensive MRSA colonization surveillance program. It is not intended to diagnose MRSA infection nor to guide or monitor treatment for MRSA infections.   MRSA PCR Screening     Status: None   Collection Time: 08/13/15  5:13 AM  Result Value Ref Range Status   MRSA by PCR NEGATIVE NEGATIVE Final    Comment:        The GeneXpert MRSA Assay (FDA approved for NASAL specimens only), is one component of a comprehensive MRSA colonization surveillance program. It is not intended to diagnose MRSA infection nor to guide or monitor treatment for MRSA infections.      Studies: Dg Chest 2 View  08/18/2015  CLINICAL DATA:  Chest pain for 1 day. Status post uterine fibroid resection yesterday. EXAM: CHEST  2 VIEW COMPARISON:  08/11/2015 and chest CTA dated 08/12/2015. FINDINGS: Normal sized heart. Clear lungs. Skin fold overlying the medial right lung base. No pneumothorax. No pleural fluid. Mild scoliosis. IMPRESSION: No acute abnormality. Electronically Signed   By: Claudie Revering M.D.   On: 08/18/2015 18:21   Ct Abdomen Pelvis W Contrast  08/18/2015  CLINICAL DATA:  Diffuse abdominal pain, including upper abdominal pain and pelvic pain. Uterine fibroids were removed on 08/13/2015. EXAM: CT ABDOMEN AND PELVIS WITH CONTRAST TECHNIQUE: Multidetector CT imaging of the abdomen and pelvis was performed using the standard protocol following bolus administration of intravenous contrast. CONTRAST:  167mL OMNIPAQUE IOHEXOL 300 MG/ML  SOLN COMPARISON:  None. FINDINGS: Mild focal scarring in the lung bases. Mild diffuse fatty infiltration of the liver. There is a focal circumscribed lesion in segment 4 of the liver measuring 2.1 cm  diameter. The pattern is not typical for focal fatty infiltration in this may represent focal liver lesion. Follow-up with MRI is suggested. There is a right suprarenal mass lesion, likely arising from the adrenal gland and measuring 5.7 by 4.6 cm. The mass is low-attenuation in contains focal fat and focal calcification. The lesion is likely to represent a myelolipoma based on the presence of macroscopic fat. This could also be evaluated at MRI. The gallbladder is surgically absent. The pancreas, spleen, left adrenal gland, kidneys, abdominal aorta, inferior vena cava, and retroperitoneal  lymph nodes are unremarkable. Stomach, small bowel, and colon are decompressed. No free air or free fluid in the abdomen. Abdominal wall musculature appears intact. Pelvis: There is a large uterine mass containing heterogeneous density and gas. This is measuring about 12.8 x 14.1 x 10.9 cm. There has been recent uterine artery embolization in this may represent changes due to necrosis of the fibroid after embolization. Presence of gas does raise suspicion for infection. Endometritis is a known complication following uterine artery embolization and may be present. The appendix is normal. No free or loculated pelvic fluid collections. No pelvic lymphadenopathy. Bladder wall is not thickened. Degenerative changes in the lumbar spine with degenerative disc disease at L5-S1. No destructive bone lesions. IMPRESSION: 1. 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 There is gas present within the area of necrosis suggesting possible superinfection and endometritis. 2. Indeterminate circumscribed lesion in segment 4 of the liver, possibly focal fatty infiltration but appearance is not typical. Consider follow-up with elective MRI. 3. Heterogeneous right adrenal gland mass containing macroscopic fat and calcification. 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  This could also be evaluated at the time of MRI. Electronically Signed   By: Lucienne Capers M.D.   On: 08/18/2015 21:48    Scheduled Meds: . clindamycin (CLEOCIN) IV  900 mg Intravenous 3 times per day  . docusate sodium  100 mg Oral BID  . ferrous sulfate  325 mg Oral BID WC  . gabapentin  100 mg Oral TID  . gentamicin  7 mg/kg (Adjusted) Intravenous Q24H  . heparin  5,000 Units Subcutaneous 3 times per day  . insulin aspart  0-15 Units Subcutaneous TID WC  . magnesium sulfate 1 - 4 g bolus IVPB  2 g Intravenous Q6 weeks  . megestrol  40 mg Oral TID  . metoprolol  50 mg Oral BID  . potassium chloride  40 mEq Oral Q6H  . sodium chloride flush  3 mL Intravenous Q12H   Continuous Infusions: . sodium chloride         Time spent: 35 minutes    Moncia Annas A  [facility] Pager [phone] If 7PM-7AM, please contact night-coverage at [email or web address], password [facility] 08/19/2015, 11:56 AM  LOS: 1 day

## 2015-08-19 NOTE — H&P (Addendum)
[facility] History and Physical  Melinda [name] GEX:[id number] DOB: 04-04-1966 DOA: 08/18/2015  Referring physician: ED physician PCP: Minerva Ends, MD  Specialists:  Dr. Ihor Dow (OBGYN)   Chief Complaint:  Abdominal pain   HPI: Melinda [name] is a 50 y.o. female with PMH of menorrhagia with iron deficiency anemia, moderate persistent asthma, and chronic migraine who presents to the ED with severe abdominal pain and nausea. She was admitted to this institution from 08/10/2015 to 08/14/2015 with polydipsia and polyuria and found to be in DKA with new diagnosis of type 2 diabetes mellitus. During that admission, she was also noted to be anemic and with dysfunctional uterine bleeding. She had been followed by an outpatient OB/GYN with plans for hysterectomy. Given the degree of bleeding, OB/GYN consultation was requested during the hospitalization and uterine fibroids were embolized by IR on 08/12/2015. Procedure was reported as successful and the patient initially did quite well, being discharged on 2/13 with new prescriptions for insulin and metformin. Packet home, she noted chills and abdominal pain which had continued to worsen, and with severe abdominal pain today, she came to the ER for further evaluation. She denies fevers, but notes chills. She endorses a brownish vaginal discharge since returning home. There is been nausea, but no diarrhea or vomiting. There's been no dysuria or flank pain. She has not been taking her insulin, explaining that she is unable to read the labels due to or blurred vision.  In ED, patient was found to Be afebrile, with sinus tachycardia to 120, and vital signs otherwise stable. Chest x-ray was obtained with no acute cardiopulmonary disease and EKG featured sinus tachycardia with nonspecific T-wave abnormality in the inferior leads and QTc prolonged to 603 ms. Basic blood work returned notable for hypokalemia to 2.5, hyperglycemia at 266, and a leukocytosis  to 10,700. She has a stable normocytic anemia with hemoglobin 9.8. 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 There is gas present within the mass, raising suspicion for endometritis. Incidental notation is made of liver lesion and heterogenous right adrenal mass that can be followed up with outpatient MRI. Dr. Glo Herring of OB/GYN was consulted from the emergency department and recommends admission to the hospital for empiric antibiotics. He plans to see the patient in the morning. Blood and cervical cultures were obtained. Empiric clindamycin and gentamicin were administered IV. Patient was bolused with 1 L of normal saline and given IV and PO potassium supplementation. She received an IV push of Dilaudid for pain, and Zofran for nausea. She remained hemodynamically stable in the emergency department and will be admitted for ongoing evaluation and management of post embolization syndrome was suspected endometritis.  Where does patient live?   At home     Can patient participate in ADLs?  Yes        Review of Systems:   General: no fevers, sweats, weight change, poor appetite, or fatigue. Chills HEENT: no blurry vision, hearing changes or sore throat Pulm: no dyspnea, cough, or wheeze CV: no chest pain or palpitations Abd: no vomiting, diarrhea, or constipation. Nausea and abdominal pain GU: no dysuria, hematuria, increased urinary frequency, or urgency. Brownish vaginal discharge  Ext: no leg edema Neuro: no focal weakness, numbness, or tingling, no vision change or hearing loss Skin: no rash, no wounds MSK: No muscle spasm, no deformity, no red, hot, or swollen joint Heme: No easy bruising or bleeding Travel history: No recent  long distant travel    Allergy:  Allergies  Allergen Reactions  . Ceftriaxone Anaphylaxis  . Penicillins Shortness Of Breath    Has patient had a PCN reaction  causing immediate rash, facial/tongue/throat swelling, SOB or lightheadedness with hypotension: Yes Has patient had a PCN reaction causing severe rash involving mucus membranes or skin necrosis: No Has patient had a PCN reaction that required hospitalization No Has patient had a PCN reaction occurring within the last 10 years: No If all of the above answers are "NO", then may proceed with Cephalosporin use.   . Shellfish Allergy Anaphylaxis  . Citrus Rash    Past Medical History  Diagnosis Date  . Migraine   . PTSD (post-traumatic stress disorder)   . Asthma   . GERD (gastroesophageal reflux disease)   . Scoliosis   . Arthritis   . ADHD (attention deficit hyperactivity disorder)   . Peripheral neuropathy ([place])   . Iron deficiency anemia due to chronic blood loss 09/09/2014  . Hyperglycemia 09/09/2014  . Low TSH level 09/08/2014  . Menorrhagia 09/08/2014  . Ovarian mass, right 09/09/2014  . PE (pulmonary embolism)   . Lumbar herniated disc   . Multiple thyroid nodules     Past Surgical History  Procedure Laterality Date  . Cholecystectomy      Social History:  reports that she quit smoking about 9 months ago. Her smoking use included Cigarettes. She has a 20 pack-year smoking history. She has never used smokeless tobacco. She reports that she uses illicit drugs (Methamphetamines). She reports that she does not drink alcohol.  Family History:  Family History  Problem Relation Age of Onset  . Diabetes Mother   . Breast cancer Mother   . CAD Mother   . Hypertension Mother   . Alcohol abuse Father   . Hypertension Father      Prior to Admission medications   Medication Sig Start Date End Date Taking? Authorizing Provider  acetaminophen (TYLENOL) 500 MG tablet Take 500-1,000 mg by mouth every 6 (six) hours as needed for moderate pain (patient takes 1-2 depending on pain level).     Historical Provider, MD  albuterol (PROVENTIL HFA;VENTOLIN HFA) 108 (90 BASE) MCG/ACT inhaler  Inhale 2 puffs into the lungs every 6 (six) hours as needed for wheezing or shortness of breath. 04/13/15   Josalyn Funches, MD  butalbital-acetaminophen-caffeine (FIORICET) 50-325-40 MG tablet Take 1-2 tablets by mouth every 8 (eight) hours as needed for headache. Patient not taking: Reported on 08/10/2015 04/13/15 04/12/16  Boykin Nearing, MD  diphenhydrAMINE (BENADRYL) 25 MG tablet Take 50 mg by mouth at bedtime as needed for sleep (sleep).    Historical Provider, MD  docusate sodium (COLACE) 100 MG capsule Take 1 capsule (100 mg total) by mouth 2 (two) times daily. 08/14/15   Thurnell Lose, MD  ferrous sulfate 325 (65 FE) MG tablet Take 1 tablet (325 mg total) by mouth 2 (two) times daily with a meal. 08/14/15   Thurnell Lose, MD  gabapentin (NEURONTIN) 100 MG capsule TAKE 1 CAPSULE BY MOUTH THREE TIMES A DAY 08/14/15   Josalyn Funches, MD  glucose monitoring kit (FREESTYLE) monitoring kit 1 each by Does not apply route 4 (four) times daily - after meals and at bedtime. 1 month Diabetic Testing Supplies for QAC-QHS accuchecks.Any brand OK. Diagnosis E11.65 08/11/15   Thurnell Lose, MD  HYDROcodone-acetaminophen (NORCO/VICODIN) 5-325 MG tablet Take 1-2 tablets by mouth every 6 (six) hours as needed for moderate pain. 08/14/15  Thurnell Lose, MD  insulin aspart (NOVOLOG) 100 UNIT/ML injection Before each meal 3 times a day, 140-199 - 2 units, 200-250 - 4 units, 251-299 - 6 units,  300-349 - 8 units,  350 or above 10 units. Dispense syringes and needles as needed, Ok to switch to PEN if approved. Substitute to any brand approved. DX DM2, Code E11.65 08/11/15   Thurnell Lose, MD  insulin glargine (LANTUS) 100 UNIT/ML injection Inject 0.3 mLs (30 Units total) into the skin daily. Dispense insulin pen if approved, if not dispense as needed syringes and needles for 1 month supply. Can switch to Levemir. Diagnosis E 11.65. 08/14/15   Thurnell Lose, MD  Insulin Syringe-Needle U-100 25G X 1" 1 ML  MISC For 4 times a day insulin SQ, 1 month supply. Diagnosis E11.65 08/11/15   Thurnell Lose, MD  Lancets (FREESTYLE) lancets For glucose testing every before meals at bedtime. Diagnosis E 11.65.   Can substitute to any accepted brand 08/11/15   Thurnell Lose, MD  megestrol (MEGACE) 40 MG tablet TAKE ONE (1) TABLET BY MOUTH 3 TIMES DAILY 07/17/15   Mora Bellman, MD  metFORMIN (GLUCOPHAGE) 850 MG tablet Take 1 tablet (850 mg total) by mouth 2 (two) times daily with a meal. 08/16/15   Thurnell Lose, MD  metoprolol (LOPRESSOR) 50 MG tablet Take 1 tablet (50 mg total) by mouth 2 (two) times daily. 08/14/15   Thurnell Lose, MD  Multiple Vitamins-Minerals (MULTIVITAMIN & MINERAL PO) Take 1 tablet by mouth daily.    Historical Provider, MD  SUMAtriptan (IMITREX) 50 MG tablet Take 1 tablet (50 mg total) by mouth every 2 (two) hours as needed for migraine or headache. May repeat in 2 hours if headache persists or recurs. Do not exceed two doses in 7 day period. 03/15/15   Boykin Nearing, MD    Physical Exam: Filed Vitals:   08/18/15 2230 08/18/15 2300 08/18/15 2330 08/19/15 0000  BP: 173/64 173/90 162/73 160/79  Pulse: 116 117 116 109  Temp:      TempSrc:      Resp: _0 Height:      Weight:      SpO2: 97% 96% 88% 96%   General: Not in acute distress HEENT:       Eyes: PERRL, EOMI, no scleral icterus or conjunctival pallor.       ENT: No discharge from the ears or nose, no pharyngeal ulcers, petechiae or exudate.        Neck: No JVD, no bruit, no appreciable mass Heme: No cervical adenopathy, no pallor Cardiac: Rate ~120 and regular, No murmurs, No gallops or rubs. Pulm: Good air movement bilaterally. No rales, wheezing, rhonchi or rubs. Abd: Soft, nondistended, tender in LLQ > RLQ, no rebound pain or gaurding, no mass or organomegaly, BS present. Ext: No LE edema bilaterally. 2+DP/PT pulse bilaterally. Musculoskeletal: No gross deformity, no red, hot, swollen joints   Skin:  No rashes or wounds on exposed surfaces  Neuro: Alert, oriented X3, cranial nerves II-XII grossly intact. Normal finger to nose test. No focal findings Psych: Patient is not overtly psychotic, appropriate mood and affect.  Labs on Admission:  Basic Metabolic Panel:  Recent Labs Lab 08/12/15 0757 08/13/15 0357 08/13/15 1233 08/14/15 0607 08/18/15 1817 08/18/15 1842  NA 136  --   --  138 141 142  K 3.7  --   --  3.7 2.6* 2.5*  CL 106  --   --  110 97* 102  CO2 14*  --   --  17*  --  27  GLUCOSE 268*  --   --  274* 270* 266*  BUN <5*  --   --  <5* <3* <5*  CREATININE 0.83  --   --  0.64 0.50 0.66  CALCIUM 8.8*  --   --  8.9  --  8.0*  MG 1.5* 1.7 1.6*  --   --   --    Liver Function Tests:  Recent Labs Lab 08/18/15 1842  AST 10*  ALT 11*  ALKPHOS 71  BILITOT 0.9  PROT 5.5*  ALBUMIN 2.3*    Recent Labs Lab 08/18/15 1842  LIPASE 24   No results for input(s): AMMONIA in the last 168 hours. CBC:  Recent Labs Lab 08/12/15 0935 08/13/15 0357 08/14/15 0607 08/18/15 1808 08/18/15 1817  WBC 15.9*  --  9.6 10.7*  --   NEUTROABS  --   --   --  7.7  --   HGB 10.8* 10.0* 9.9* 9.8* 10.9*  HCT 33.2* 30.9* 30.2* 30.9* 32.0*  MCV 83.0  --  84.6 82.0  --   PLT 248  --  215 368  --    Cardiac Enzymes: No results for input(s): CKTOTAL, CKMB, CKMBINDEX, TROPONINI in the last 168 hours.  BNP (last 3 results)  Recent Labs  09/08/14 1345  BNP 192.8*    ProBNP (last 3 results) No results for input(s): PROBNP in the last 8760 hours.  CBG:  Recent Labs Lab 08/13/15 1230 08/13/15 1709 08/13/15 2053 08/14/15 0815 08/14/15 1219  GLUCAP 182* 213* 258* 250* 305*    Radiological Exams on Admission: Dg Chest 2 View  08/18/2015  CLINICAL DATA:  Chest pain for 1 day. Status post uterine fibroid resection yesterday. EXAM: CHEST  2 VIEW COMPARISON:  08/11/2015 and chest CTA dated 08/12/2015. FINDINGS: Normal sized heart. Clear lungs. Skin fold overlying the medial right  lung base. No pneumothorax. No pleural fluid. Mild scoliosis. IMPRESSION: No acute abnormality. Electronically Signed   By: Claudie Revering M.D.   On: 08/18/2015 18:21   Ct Abdomen Pelvis W Contrast  08/18/2015  CLINICAL DATA:  Diffuse abdominal pain, including upper abdominal pain and pelvic pain. Uterine fibroids were removed on 08/13/2015. EXAM: CT ABDOMEN AND PELVIS WITH CONTRAST TECHNIQUE: Multidetector CT imaging of the abdomen and pelvis was performed using the standard protocol following bolus administration of intravenous contrast. CONTRAST:  116m OMNIPAQUE IOHEXOL 300 MG/ML  SOLN COMPARISON:  None. FINDINGS: Mild focal scarring in the lung bases. Mild diffuse fatty infiltration of the liver. There is a focal circumscribed lesion in segment 4 of the liver measuring 2.1 cm diameter. The pattern is not typical for focal fatty infiltration in this may represent focal liver lesion. Follow-up with MRI is suggested. There is a right suprarenal mass lesion, likely arising from the adrenal gland and measuring 5.7 by 4.6 cm. The mass is low-attenuation in contains focal fat and focal calcification. The lesion is likely to represent a myelolipoma based on the presence of macroscopic fat. This could also be evaluated at MRI. The gallbladder is surgically absent. The pancreas, spleen, left adrenal gland, kidneys, abdominal aorta, inferior vena cava, and retroperitoneal lymph nodes are unremarkable. Stomach, small bowel, and colon are decompressed. No free air or free fluid in the abdomen. Abdominal wall musculature appears intact. Pelvis: There is a large uterine mass containing heterogeneous density and gas. This is measuring about 12.8 x 14.1 x 10.9  cm. There has been recent uterine artery embolization in this may represent changes due to necrosis of the fibroid after embolization. Presence of gas does raise suspicion for infection. Endometritis is a known complication following uterine artery embolization and may  be present. The appendix is normal. No free or loculated pelvic fluid collections. No pelvic lymphadenopathy. Bladder wall is not thickened. Degenerative changes in the lumbar spine with degenerative disc disease at L5-S1. No destructive bone lesions. IMPRESSION: 1. 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 There is gas present within the area of necrosis suggesting possible superinfection and endometritis. 2. Indeterminate circumscribed lesion in segment 4 of the liver, possibly focal fatty infiltration but appearance is not typical. Consider follow-up with elective MRI. 3. Heterogeneous right adrenal gland mass containing macroscopic fat and calcification. 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 This could also be evaluated at the time of MRI. Electronically Signed   By: Lucienne Capers M.D.   On: 08/18/2015 21:48    EKG: Independently reviewed.  Abnormal findings:  Sinus tachycardia (rate 115), non-specific Tw change in inferior leads, QTc 603   Assessment/Plan  1. Sepsis secondary to endometritis  - Sepsis on arrival with tachycardia and leukocytosis  - CT features gas in uterine wall suspicious for infection  - Dr. Glo Herring of OBGYN consulting and much appreciated  - Blood and vaginal cultures incubating  - Treating empirically with clindamycin and gentamicin  - Trend lactate, first is reassuring at 1.03    2. Postembolization syndrome  - Underwent embolization of uterine fibroid on 08/12/15 and initially did fine, pain controlled with Norco  - There is a necrotizing uterine mass noted on CT  - Pain-control  - Treatment of associated endometritis as above    3. Hypokalemia with EKG changes - Potassium 2.5 on presentation, QTc prolonged to 603 ms  - Received 30 meq IV and 40 meq PO in ED  - Additional K+ added to IVF  - Check magnesium and replete prn  - Repeat chemistries in am  - Monitor on  telemetry, caution with QT-prolonging medications  4. Insulin-dependent DM  - Recent diagnosis; discharged from hospital 08/14/15 with Lantus 30 U qHS, Novolog 2-10 U per sliding scale TID with meals, and metformin  - Pt reports not taking the medications because her blurred vision prevents her from reading the labels  - Hold metformin while inpt  - Continue Lantus at reduced dose, moderate intensity SSI correctional  - CBG with meals and qHS  - A1c pending   5. Asthma, moderate persistent  - Stable, not in exacerbation  - Continue prn albuterol MDI    6. Iron-deficiency anemia  - Likely secondary to menorrhagia, dysfunctional uterine bleeding  - Will hopefully improve now that large fibroid embolized  - Continue to supplement iron     DVT ppx: SQ Heparin     Code Status: Full code Family Communication: None at bed side.      Disposition Plan: Admit to inpatient   Date of Service 08/19/2015    Vianne Bulls, MD [facility] Pager [phone]  If 7PM-7AM, please contact night-coverage [email or web address] Password [facility] 08/19/2015, 12:21 AM

## 2015-08-20 DIAGNOSIS — J4530 Mild persistent asthma, uncomplicated: Secondary | ICD-10-CM

## 2015-08-20 DIAGNOSIS — D500 Iron deficiency anemia secondary to blood loss (chronic): Secondary | ICD-10-CM

## 2015-08-20 DIAGNOSIS — N939 Abnormal uterine and vaginal bleeding, unspecified: Secondary | ICD-10-CM

## 2015-08-20 DIAGNOSIS — R000 Tachycardia, unspecified: Secondary | ICD-10-CM

## 2015-08-20 DIAGNOSIS — E119 Type 2 diabetes mellitus without complications: Secondary | ICD-10-CM

## 2015-08-20 DIAGNOSIS — Z794 Long term (current) use of insulin: Secondary | ICD-10-CM

## 2015-08-20 DIAGNOSIS — E876 Hypokalemia: Secondary | ICD-10-CM

## 2015-08-20 LAB — BASIC METABOLIC PANEL
ANION GAP: 10 (ref 5–15)
BUN: <5 mg/dL — ABNORMAL LOW (ref 6–20)
CALCIUM: 8.4 mg/dL — AB (ref 8.9–10.3)
CO2: 25 mmol/L (ref 22–32)
CREATININE: 0.51 mg/dL (ref 0.44–1.00)
Chloride: 107 mmol/L (ref 101–111)
GLUCOSE: 258 mg/dL — AB (ref 65–99)
Potassium: 3.5 mmol/L (ref 3.5–5.1)
Sodium: 142 mmol/L (ref 135–145)

## 2015-08-20 LAB — URINE CULTURE
Culture: 7000
Special Requests: NORMAL

## 2015-08-20 LAB — GLUCOSE, CAPILLARY
GLUCOSE-CAPILLARY: 242 mg/dL — AB (ref 65–99)
GLUCOSE-CAPILLARY: 320 mg/dL — AB (ref 65–99)
GLUCOSE-CAPILLARY: 229 mg/dL — AB (ref 65–99)
Glucose-Capillary: 236 mg/dL — ABNORMAL HIGH (ref 65–99)
Glucose-Capillary: 252 mg/dL — ABNORMAL HIGH (ref 65–99)

## 2015-08-20 LAB — MAGNESIUM: Magnesium: 1.9 mg/dL (ref 1.7–2.4)

## 2015-08-20 MED ORDER — INSULIN GLARGINE 100 UNIT/ML ~~LOC~~ SOLN
25.0000 [IU] | Freq: Two times a day (BID) | SUBCUTANEOUS | Status: DC
  Administered 2015-08-21: 25 [IU] via SUBCUTANEOUS
  Filled 2015-08-20 (×2): qty 0.25

## 2015-08-20 MED ORDER — GI COCKTAIL ~~LOC~~: 30.0000 mL | Freq: Once | ORAL | Status: DC

## 2015-08-20 MED ORDER — INSULIN GLARGINE 100 UNIT/ML ~~LOC~~ SOLN
15.0000 [IU] | Freq: Once | SUBCUTANEOUS | Status: AC
  Administered 2015-08-20: 15 [IU] via SUBCUTANEOUS
  Filled 2015-08-20: qty 0.15

## 2015-08-20 MED ORDER — POTASSIUM CHLORIDE CRYS ER 20 MEQ PO TBCR
60.0000 meq | EXTENDED_RELEASE_TABLET | Freq: Once | ORAL | Status: AC
  Administered 2015-08-20: 60 meq via ORAL
  Filled 2015-08-20: qty 3

## 2015-08-20 NOTE — Progress Notes (Signed)
Patient ID: Melinda [name], female   DOB: October 18, 1965, 50 y.o.   MRN: [id number] Day 2, of hospitalization for pain control and concern for infection s/p fibroid embolization, primarily due to presence of gas bubbles thru uterus that are explained better by Nitrogen gas bubbles that are "normal in fibroids after [place]".    Subjective: Patient reports pain dramatically better on Ketorolac Pt tolerating po intake normal bowel activity. Pt physical activity limited by her chronic back pain and overall medical condtion.  Objective: I have reviewed patient's vital signs, medications and labs  No tachycardia,  Normal VS CBG's suboptimal, not unusual for pt and her relatively new dx of DM. CBG (last 3)   Recent Labs  08/19/15 2155 08/20/15 0056 08/20/15 0545  GLUCAP 243* 252* 236*    Temp:  [97.6 F (36.4 C)-99 F (37.2 C)] 98.5 F (36.9 C) (02/19 0429) Pulse Rate:  [72-98] 98 (02/19 0429) Resp:  [18-20] 20 (02/19 0429) BP: (133-159)/(58-89) 133/58 mmHg (02/18 2042) SpO2:  [96 %-99 %] 98 % (02/19 0429) Weight:  [138.03 kg (304 lb 4.8 oz)] 138.03 kg (304 lb 4.8 oz) (02/19 0429)  General: alert, cooperative, appears older than stated age and no distress GI: normal findings: bowel sounds normal, no organomegaly and significantly improved lower abd pain and . Extremities: extremities normal, atraumatic, no cyanosis or edema and Homans sign is negative, no sign of DVT   Assessment/Plan: No current acute concern other than pelvic pain as an expected sequelae of embolization DM 2 , suboptimal control  Plan: I anticipate conversion to outpt care shortly, now that pain control on NSAID, specifically Toradol, has given pt significantly improved relief. Decisions re: antibiotic therapy best answered by IR. I will sign off: Thank you for the opportunity to see this quite interesting patient.   LOS: 2 days    Bary Limbach V 08/20/2015, 7:27 AM

## 2015-08-20 NOTE — Progress Notes (Signed)
Referring Physician(s): O[place]  Chief Complaint: Abdominal pain Recent [place] by Dr. Pascal Lux 08/13/2015  Subjective:  Melinda [name] states she is feeling a little better today.  She states her abdomen was "fine until the other doctors pushed on it". She asked me not to palpate her abdomen today.  Allergies: Ceftriaxone; Penicillins; Shellfish allergy; and Citrus  Medications: Prior to Admission medications   Medication Sig Start Date End Date Taking? Authorizing Provider  acetaminophen (TYLENOL) 500 MG tablet Take 500-1,000 mg by mouth every 6 (six) hours as needed for moderate pain (patient takes 1-2 depending on pain level).    Yes Historical Provider, MD  albuterol (PROVENTIL HFA;VENTOLIN HFA) 108 (90 BASE) MCG/ACT inhaler Inhale 2 puffs into the lungs every 6 (six) hours as needed for wheezing or shortness of breath. 04/13/15  Yes Josalyn Funches, MD  diphenhydrAMINE (BENADRYL) 25 MG tablet Take 50 mg by mouth at bedtime as needed for sleep (sleep).   Yes Historical Provider, MD  gabapentin (NEURONTIN) 100 MG capsule TAKE 1 CAPSULE BY MOUTH THREE TIMES A DAY 08/14/15  Yes Josalyn Funches, MD  megestrol (MEGACE) 40 MG tablet TAKE ONE (1) TABLET BY MOUTH 3 TIMES DAILY 07/17/15  Yes Peggy Constant, MD  Multiple Vitamins-Minerals (MULTIVITAMIN & MINERAL PO) Take 1 tablet by mouth daily.   Yes Historical Provider, MD  SUMAtriptan (IMITREX) 50 MG tablet Take 1 tablet (50 mg total) by mouth every 2 (two) hours as needed for migraine or headache. May repeat in 2 hours if headache persists or recurs. Do not exceed two doses in 7 day period. 03/15/15  Yes Josalyn Funches, MD  butalbital-acetaminophen-caffeine (FIORICET) 50-325-40 MG tablet Take 1-2 tablets by mouth every 8 (eight) hours as needed for headache. Patient not taking: Reported on 08/10/2015 04/13/15 04/12/16  Boykin Nearing, MD  docusate sodium (COLACE) 100 MG capsule Take 1 capsule (100 mg total) by mouth 2 (two) times daily. 08/14/15   Thurnell Lose, MD  ferrous sulfate 325 (65 FE) MG tablet Take 1 tablet (325 mg total) by mouth 2 (two) times daily with a meal. 08/14/15   Thurnell Lose, MD  HYDROcodone-acetaminophen (NORCO/VICODIN) 5-325 MG tablet Take 1-2 tablets by mouth every 6 (six) hours as needed for moderate pain. 08/14/15   Thurnell Lose, MD  insulin aspart (NOVOLOG) 100 UNIT/ML injection Before each meal 3 times a day, 140-199 - 2 units, 200-250 - 4 units, 251-299 - 6 units,  300-349 - 8 units,  350 or above 10 units. Dispense syringes and needles as needed, Ok to switch to PEN if approved. Substitute to any brand approved. DX DM2, Code E11.65 08/11/15   Thurnell Lose, MD  insulin glargine (LANTUS) 100 UNIT/ML injection Inject 0.3 mLs (30 Units total) into the skin daily. Dispense insulin pen if approved, if not dispense as needed syringes and needles for 1 month supply. Can switch to Levemir. Diagnosis E 11.65. 08/14/15   Thurnell Lose, MD  Insulin Syringe-Needle U-100 25G X 1" 1 ML MISC For 4 times a day insulin SQ, 1 month supply. Diagnosis E11.65 08/11/15   Thurnell Lose, MD  metFORMIN (GLUCOPHAGE) 850 MG tablet Take 1 tablet (850 mg total) by mouth 2 (two) times daily with a meal. 08/16/15   Thurnell Lose, MD  metoprolol (LOPRESSOR) 50 MG tablet Take 1 tablet (50 mg total) by mouth 2 (two) times daily. 08/14/15   Thurnell Lose, MD     Vital Signs: BP 133/58 mmHg  Pulse 98  Temp(Src) 98.5 F (36.9 C) (Oral)  Resp 20  Ht 5\' 10"  (1.778 m)  Wt 304 lb 4.8 oz (138.03 kg)  BMI 43.66 kg/m2  SpO2 98%  LMP 08/11/2015  Physical Exam Awake and Alert NAD Abdomen not palpated at patient's request, but auscultated,  + bowel sounds Heart 96 bpm on exam today, was 104 yesterday Lungs Clear  Imaging: Dg Chest 2 View  08/18/2015  CLINICAL DATA:  Chest pain for 1 day. Status post uterine fibroid resection yesterday. EXAM: CHEST  2 VIEW COMPARISON:  08/11/2015 and chest CTA dated 08/12/2015. FINDINGS: Normal sized  heart. Clear lungs. Skin fold overlying the medial right lung base. No pneumothorax. No pleural fluid. Mild scoliosis. IMPRESSION: No acute abnormality. Electronically Signed   By: Claudie Revering M.D.   On: 08/18/2015 18:21   Ct Abdomen Pelvis W Contrast  08/18/2015  CLINICAL DATA:  Diffuse abdominal pain, including upper abdominal pain and pelvic pain. Uterine fibroids were removed on 08/13/2015. EXAM: CT ABDOMEN AND PELVIS WITH CONTRAST TECHNIQUE: Multidetector CT imaging of the abdomen and pelvis was performed using the standard protocol following bolus administration of intravenous contrast. CONTRAST:  169mL OMNIPAQUE IOHEXOL 300 MG/ML  SOLN COMPARISON:  None. FINDINGS: Mild focal scarring in the lung bases. Mild diffuse fatty infiltration of the liver. There is a focal circumscribed lesion in segment 4 of the liver measuring 2.1 cm diameter. The pattern is not typical for focal fatty infiltration in this may represent focal liver lesion. Follow-up with MRI is suggested. There is a right suprarenal mass lesion, likely arising from the adrenal gland and measuring 5.7 by 4.6 cm. The mass is low-attenuation in contains focal fat and focal calcification. The lesion is likely to represent a myelolipoma based on the presence of macroscopic fat. This could also be evaluated at MRI. The gallbladder is surgically absent. The pancreas, spleen, left adrenal gland, kidneys, abdominal aorta, inferior vena cava, and retroperitoneal lymph nodes are unremarkable. Stomach, small bowel, and colon are decompressed. No free air or free fluid in the abdomen. Abdominal wall musculature appears intact. Pelvis: There is a large uterine mass containing heterogeneous density and gas. This is measuring about 12.8 x 14.1 x 10.9 cm. There has been recent uterine artery embolization in this may represent changes due to necrosis of the fibroid after embolization. Presence of gas does raise suspicion for infection. Endometritis is a known  complication following uterine artery embolization and may be present. The appendix is normal. No free or loculated pelvic fluid collections. No pelvic lymphadenopathy. Bladder wall is not thickened. Degenerative changes in the lumbar spine with degenerative disc disease at L5-S1. No destructive bone lesions. IMPRESSION: 1. 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 There is gas present within the area of necrosis suggesting possible superinfection and endometritis. 2. Indeterminate circumscribed lesion in segment 4 of the liver, possibly focal fatty infiltration but appearance is not typical. Consider follow-up with elective MRI. 3. Heterogeneous right adrenal gland mass containing macroscopic fat and calcification. 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 This could also be evaluated at the time of MRI. Electronically Signed   By: Lucienne Capers M.D.   On: 08/18/2015 21:48    Labs:  CBC:  Recent Labs  08/12/15 0935  08/14/15 0607 08/18/15 1808 08/18/15 1817 08/19/15 0501  WBC 15.9*  --  9.6 10.7*  --  10.2  HGB 10.8*  < > 9.9* 9.8* 10.9* 9.2*  HCT 33.2*  < > 30.2* 30.9* 32.0* 28.8*  PLT 248  --  215 368  --  347  < > = values in this interval not displayed.  COAGS:  Recent Labs  08/13/15 0357 08/19/15 0147  INR 1.27 1.43  APTT  --  29    BMP:  Recent Labs  08/14/15 0607 08/18/15 1817 08/18/15 1842 08/19/15 0501 08/20/15 0408  NA 138 141 142 141 142  K 3.7 2.6* 2.5* 3.3* 3.5  CL 110 97* 102 106 107  CO2 17*  --  27 23 25   GLUCOSE 274* 270* 266* 247* 258*  BUN <5* <3* <5* <5* <5*  CALCIUM 8.9  --  8.0* 7.8* 8.4*  CREATININE 0.64 0.50 0.66 0.64 0.51  GFRNONAA >60  --  >60 >60 >60  GFRAA >60  --  >60 >60 >60    LIVER FUNCTION TESTS:  Recent Labs  09/08/14 0147 11/09/14 0310 08/11/15 0352 08/18/15 1842  BILITOT 0.5 1.0 0.9 0.9  AST 14 57* 13* 10*  ALT 11 11* 16 11*    ALKPHOS 73 75 92 71  PROT 7.2 6.1* 6.0* 5.5*  ALBUMIN 4.0 3.1* 3.2* 2.3*    Assessment and Plan:  S/P [place] by Dr. Pascal Lux on 08/13/2015  Continue NSAIDs for pain and use narcotics only if pain not controlled with NSAIDs.  Continue antibiotics.  Electronically Signed: Murrell Redden PA-C 08/20/2015, 10:31 AM   I spent a total of 15 Minutes at the the patient's bedside AND on the patient's hospital floor or unit, greater than 50% of which was counseling/coordinating care for abdominal pain after [place]

## 2015-08-20 NOTE — Progress Notes (Addendum)
[facility] PROGRESS NOTE   Melinda [name] [id number] DOB: 05-20-1966 DOA: 08/18/2015 PCP: Minerva Ends, MD  HPI/Subjective: She still complaining about abdominal pain. Can be discharged in a.m.  Assessment/Plan: Principal Problem:   Sepsis, unspecified organism ([place]) Active Problems:   Iron deficiency anemia due to chronic blood loss   Sinus tachycardia (HCC)   Morbid obesity (HCC)   Asthma   Abnormal uterine bleeding (AUB)   Endometritis   Insulin dependent diabetes mellitus (HCC)   Hypokalemia   Prolonged Q-T interval on ECG   Sepsis secondary to endometritis  - Sepsis on arrival with tachycardia and leukocytosis  - CT features gas in uterine wall suspicious for infection  - Dr. Glo Herring of OBGYN consulting and much appreciated  - continue clindamycin and gentamicin per OB/GYN recommendation. -Interventional radiology reported that he has bubbles are not that uncommon after uterine artery embolization.  Postembolization syndrome  - Underwent embolization of uterine fibroid on 08/12/15 and initially did fine, pain controlled with Norco  - There is a necrotizing uterine mass noted on CT  - continue pain control.   Hypokalemia with EKG changes - Potassium 2.5 on presentation. - this is repleted with oral supplements.  Hypomagnesemia -Repleted with parenteral supplements  Insulin-dependent DM  - Recent diagnosis; discharged from hospital 08/14/15 with Lantus 30 U qHS, Novolog 2-10 U per sliding scale TID with meals, and metformin  - Pt reports not taking the medications because her blurred vision prevents her from reading the labels  - Hold metformin while inpt  - Lantus restarted 35 units, blood sugar consistently in 200s increased to 50 units (25 units twice a day)  Asthma, moderate persistent  - Stable, not in exacerbation  - Continue prn albuterol MDI   Iron-deficiency anemia  - Likely secondary to menorrhagia, dysfunctional  uterine bleeding  - Will hopefully improve now that large fibroid embolized  - Continue to supplement iron   Code Status: Full Code Family Communication: Plan discussed with the patient. Disposition Plan: Remains inpatient Diet: Diet Carb Modified Fluid consistency:: Thin; Room service appropriate?: Yes  Consultants:  OB/GYN  Interventional radiology  Procedures:  None  Antibiotics:  Clindamycin and gent (indicate start date, and stop date if known)   Objective: Filed Vitals:   08/20/15 0429 08/20/15 1148  BP:  126/67  Pulse: 98 93  Temp: 98.5 F (36.9 C) 98.3 F (36.8 C)  Resp: 20 16    Intake/Output Summary (Last 24 hours) at 08/20/15 1425 Last data filed at 08/20/15 1339  Gross per 24 hour  Intake 1932.5 ml  Output   1552 ml  Net  380.5 ml   Filed Weights   08/18/15 1712 08/19/15 0055 08/20/15 0429  Weight: 131.543 kg (290 lb) 135.898 kg (299 lb 9.6 oz) 138.03 kg (304 lb 4.8 oz)    Exam: General: Alert and awake, oriented x3, not in any acute distress. HEENT: anicteric sclera, pupils reactive to light and accommodation, EOMI CVS: S1-S2 clear, no murmur rubs or gallops Chest: clear to auscultation bilaterally, no wheezing, rales or rhonchi Abdomen: soft nontender, nondistended, normal bowel sounds, no organomegaly Extremities: no cyanosis, clubbing or edema noted bilaterally Neuro: Cranial nerves II-XII intact, no focal neurological deficits  Data Reviewed: Basic Metabolic Panel:  Recent Labs Lab 08/14/15 0607 08/18/15 1817 08/18/15 1842 08/19/15 0147 08/19/15 0501 08/20/15 0408  NA 138 141 142  --  141 142  K 3.7 2.6* 2.5*  --  3.3* 3.5  CL 110 97* 102  --  106 107  CO2 17*  --  27  --  23 25  GLUCOSE 274* 270* 266*  --  247* 258*  BUN <5* <3* <5*  --  <5* <5*  CREATININE 0.64 0.50 0.66  --  0.64 0.51  CALCIUM 8.9  --  8.0*  --  7.8* 8.4*  MG  --   --   --  1.4*  --  1.9   Liver Function Tests:  Recent Labs Lab 08/18/15 1842    AST 10*  ALT 11*  ALKPHOS 71  BILITOT 0.9  PROT 5.5*  ALBUMIN 2.3*    Recent Labs Lab 08/18/15 1842  LIPASE 24   No results for input(s): AMMONIA in the last 168 hours. CBC:  Recent Labs Lab 08/14/15 0607 08/18/15 1808 08/18/15 1817 08/19/15 0501  WBC 9.6 10.7*  --  10.2  NEUTROABS  --  7.7  --  7.2  HGB 9.9* 9.8* 10.9* 9.2*  HCT 30.2* 30.9* 32.0* 28.8*  MCV 84.6 82.0  --  83.0  PLT 215 368  --  347   Cardiac Enzymes: No results for input(s): CKTOTAL, CKMB, CKMBINDEX, TROPONINI in the last 168 hours. BNP (last 3 results)  Recent Labs  09/08/14 1345  BNP 192.8*    ProBNP (last 3 results) No results for input(s): PROBNP in the last 8760 hours.  CBG:  Recent Labs Lab 08/19/15 1707 08/19/15 2155 08/20/15 0056 08/20/15 0545 08/20/15 1150  GLUCAP 256* 243* 252* 236* 242*    Micro Recent Results (from the past 240 hour(s))  MRSA PCR Screening     Status: None   Collection Time: 08/11/15  7:03 AM  Result Value Ref Range Status   MRSA by PCR NEGATIVE NEGATIVE Final    Comment:        The GeneXpert MRSA Assay (FDA approved for NASAL specimens only), is one component of a comprehensive MRSA colonization surveillance program. It is not intended to diagnose MRSA infection nor to guide or monitor treatment for MRSA infections.   MRSA PCR Screening     Status: None   Collection Time: 08/13/15  5:13 AM  Result Value Ref Range Status   MRSA by PCR NEGATIVE NEGATIVE Final    Comment:        The GeneXpert MRSA Assay (FDA approved for NASAL specimens only), is one component of a comprehensive MRSA colonization surveillance program. It is not intended to diagnose MRSA infection nor to guide or monitor treatment for MRSA infections.   Culture, routine-genital     Status: None (Preliminary result)   Collection Time: 08/18/15 10:50 PM  Result Value Ref Range Status   Specimen Description GENITAL  Final   Special Requests Normal  Final   Culture    Final    Culture reincubated for better growth Performed at [facility]    Report Status PENDING  Incomplete     Studies: Dg Chest 2 View  08/18/2015  CLINICAL DATA:  Chest pain for 1 day. Status post uterine fibroid resection yesterday. EXAM: CHEST  2 VIEW COMPARISON:  08/11/2015 and chest CTA dated 08/12/2015. FINDINGS: Normal sized heart. Clear lungs. Skin fold overlying the medial right lung base. No pneumothorax. No pleural fluid. Mild scoliosis. IMPRESSION: No acute abnormality. Electronically Signed   By: Claudie Revering M.D.   On: 08/18/2015 18:21   Ct Abdomen Pelvis W Contrast  08/18/2015  CLINICAL DATA:  Diffuse abdominal pain, including upper abdominal pain and pelvic pain. Uterine fibroids were removed on  08/13/2015. EXAM: CT ABDOMEN AND PELVIS WITH CONTRAST TECHNIQUE: Multidetector CT imaging of the abdomen and pelvis was performed using the standard protocol following bolus administration of intravenous contrast. CONTRAST:  175mL OMNIPAQUE IOHEXOL 300 MG/ML  SOLN COMPARISON:  None. FINDINGS: Mild focal scarring in the lung bases. Mild diffuse fatty infiltration of the liver. There is a focal circumscribed lesion in segment 4 of the liver measuring 2.1 cm diameter. The pattern is not typical for focal fatty infiltration in this may represent focal liver lesion. Follow-up with MRI is suggested. There is a right suprarenal mass lesion, likely arising from the adrenal gland and measuring 5.7 by 4.6 cm. The mass is low-attenuation in contains focal fat and focal calcification. The lesion is likely to represent a myelolipoma based on the presence of macroscopic fat. This could also be evaluated at MRI. The gallbladder is surgically absent. The pancreas, spleen, left adrenal gland, kidneys, abdominal aorta, inferior vena cava, and retroperitoneal lymph nodes are unremarkable. Stomach, small bowel, and colon are decompressed. No free air or free fluid in the abdomen. Abdominal wall  musculature appears intact. Pelvis: There is a large uterine mass containing heterogeneous density and gas. This is measuring about 12.8 x 14.1 x 10.9 cm. There has been recent uterine artery embolization in this may represent changes due to necrosis of the fibroid after embolization. Presence of gas does raise suspicion for infection. Endometritis is a known complication following uterine artery embolization and may be present. The appendix is normal. No free or loculated pelvic fluid collections. No pelvic lymphadenopathy. Bladder wall is not thickened. Degenerative changes in the lumbar spine with degenerative disc disease at L5-S1. No destructive bone lesions. IMPRESSION: 1. 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 There is gas present within the area of necrosis suggesting possible superinfection and endometritis. 2. Indeterminate circumscribed lesion in segment 4 of the liver, possibly focal fatty infiltration but appearance is not typical. Consider follow-up with elective MRI. 3. Heterogeneous right adrenal gland mass containing macroscopic fat and calcification. 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 This could also be evaluated at the time of MRI. Electronically Signed   By: Lucienne Capers M.D.   On: 08/18/2015 21:48    Scheduled Meds: . clindamycin (CLEOCIN) IV  900 mg Intravenous 3 times per day  . docusate sodium  100 mg Oral BID  . ferrous sulfate  325 mg Oral BID WC  . gabapentin  100 mg Oral TID  . gentamicin  7 mg/kg (Adjusted) Intravenous Q24H  . heparin  5,000 Units Subcutaneous 3 times per day  . insulin aspart  0-15 Units Subcutaneous TID WC  . insulin glargine  35 Units Subcutaneous Q24H  . magnesium sulfate 1 - 4 g bolus IVPB  2 g Intravenous Q6 weeks  . megestrol  40 mg Oral TID  . metoprolol  50 mg Oral BID  . sodium chloride flush  3 mL Intravenous Q12H   Continuous Infusions:        Time spent: 35 minutes    [facility] A  [facility] Pager [phone] If 7PM-7AM, please contact night-coverage at [email or web address], password [facility] 08/20/2015, 2:25 PM  LOS: 2 days

## 2015-08-21 DIAGNOSIS — I4581 Long QT syndrome: Secondary | ICD-10-CM

## 2015-08-21 LAB — BASIC METABOLIC PANEL
Anion gap: 8 (ref 5–15)
CALCIUM: 8.3 mg/dL — AB (ref 8.9–10.3)
CO2: 24 mmol/L (ref 22–32)
CREATININE: 0.57 mg/dL (ref 0.44–1.00)
Chloride: 105 mmol/L (ref 101–111)
GFR calc Af Amer: >60 mL/min (ref >60)
GLUCOSE: 251 mg/dL — AB (ref 65–99)
Potassium: 3.4 mmol/L — ABNORMAL LOW (ref 3.5–5.1)
Sodium: 137 mmol/L (ref 135–145)

## 2015-08-21 LAB — GLUCOSE, CAPILLARY
GLUCOSE-CAPILLARY: 250 mg/dL — AB (ref 65–99)
Glucose-Capillary: 218 mg/dL — ABNORMAL HIGH (ref 65–99)

## 2015-08-21 LAB — PATHOLOGIST SMEAR REVIEW

## 2015-08-21 LAB — CULTURE, ROUTINE-GENITAL
CULTURE: NORMAL
Special Requests: NORMAL

## 2015-08-21 MED ORDER — CIPROFLOXACIN HCL 500 MG PO TABS: 500.0000 mg | ORAL_TABLET | Freq: Two times a day (BID) | ORAL | Status: DC

## 2015-08-21 MED ORDER — POTASSIUM CHLORIDE CRYS ER 20 MEQ PO TBCR
60.0000 meq | EXTENDED_RELEASE_TABLET | Freq: Once | ORAL | Status: AC
  Administered 2015-08-21: 60 meq via ORAL
  Filled 2015-08-21: qty 3

## 2015-08-21 MED ORDER — METRONIDAZOLE 500 MG PO TABS
500.0000 mg | ORAL_TABLET | Freq: Three times a day (TID) | ORAL | Status: DC
Start: 2015-08-21 — End: 2015-10-09

## 2015-08-21 MED ORDER — INSULIN ASPART 100 UNIT/ML ~~LOC~~ SOLN: 10.0000 [IU] | Freq: Three times a day (TID) | SUBCUTANEOUS | Status: DC

## 2015-08-21 MED ORDER — OXYCODONE-ACETAMINOPHEN 5-325 MG PO TABS: 1.0000 | ORAL_TABLET | ORAL | Status: DC | PRN

## 2015-08-21 MED ORDER — INSULIN GLARGINE 100 UNIT/ML ~~LOC~~ SOLN: 25.0000 [IU] | Freq: Two times a day (BID) | SUBCUTANEOUS | Status: DC

## 2015-08-21 NOTE — Progress Notes (Signed)
CSW spoke with pt regarding transportation issues for returning home  Pt only support is sister who lives in [place] and is not in town at this time to transport- pt reports no other family or supports who are able to transport  Pt is not medically stable to ride the bus  CSW provided RN with taxi voucher- RN to call for taxi when pt is ready for DC  CSW signing off  Melinda [name], [place] Worker [phone]

## 2015-08-21 NOTE — Discharge Summary (Signed)
Physician Discharge Summary  Melinda [name] KDT:[id number] DOB: [birth year] DOA: 08/18/2015  PCP: Minerva Ends, MD  Admit date: 08/18/2015 Discharge date: 08/21/2015  Time spent: 40 minutes  Recommendations for Outpatient Follow-up:  1. Follow-up with primary care physician within 1 week. 2. Lantus dose changed to 25 units BID and Novolog to 10 units with meals, continued Metformin at the same dosage.   Discharge Diagnoses:  Principal Problem:   Sepsis, unspecified organism [facility]) Active Problems:   Iron deficiency anemia due to chronic blood loss   Sinus tachycardia (HCC)   Morbid obesity (HCC)   Asthma   Abnormal uterine bleeding (AUB)   Endometritis   Insulin dependent diabetes mellitus ([place])   Hypokalemia   Prolonged Q-T interval on ECG   Discharge Condition:  Stable  Diet recommendation:  Carbohydrate modified diet  Filed Weights   08/19/15 0055 08/20/15 0429 08/21/15 0337  Weight: 135.898 kg (299 lb 9.6 oz) 138.03 kg (304 lb 4.8 oz) 138.438 kg (305 lb 3.2 oz)    History of present illness:  Melinda [name] is a 50 y.o. female with PMH of menorrhagia with iron deficiency anemia, moderate persistent asthma, and chronic migraine who presents to the ED with severe abdominal pain and nausea. She was admitted to this institution from 08/10/2015 to 08/14/2015 with polydipsia and polyuria and found to be in DKA with new diagnosis of type 2 diabetes mellitus. During that admission, she was also noted to be anemic and with dysfunctional uterine bleeding. She had been followed by an outpatient OB/GYN with plans for hysterectomy. Given the degree of bleeding, OB/GYN consultation was requested during the hospitalization and uterine fibroids were embolized by IR on 08/12/2015. Procedure was reported as successful and the patient initially did quite well, being discharged on 2/13 with new prescriptions for insulin and metformin. Packet home, she noted chills and abdominal pain which had  continued to worsen, and with severe abdominal pain today, she came to the ER for further evaluation. She denies fevers, but notes chills. She endorses a brownish vaginal discharge since returning home. There is been nausea, but no diarrhea or vomiting. There's been no dysuria or flank pain. She has not been taking her insulin, explaining that she is unable to read the labels due to or blurred vision.  In ED, patient was found to Be afebrile, with sinus tachycardia to 120, and vital signs otherwise stable. Chest x-ray was obtained with no acute cardiopulmonary disease and EKG featured sinus tachycardia with nonspecific T-wave abnormality in the inferior leads and QTc prolonged to 603 ms. Basic blood work returned notable for hypokalemia to 2.5, hyperglycemia at 266, and a leukocytosis to 10,700. She has a stable normocytic anemia with hemoglobin 9.8. 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 There is gas present within the mass, raising suspicion for endometritis. Incidental notation is made of liver lesion and heterogenous right adrenal mass that can be followed up with outpatient MRI. Dr. Glo Herring of OB/GYN was consulted from the emergency department and recommends admission to the hospital for empiric antibiotics. He plans to see the patient in the morning. Blood and cervical cultures were obtained. Empiric clindamycin and gentamicin were administered IV. Patient was bolused with 1 L of normal saline and given IV and PO potassium supplementation. She received an IV push of Dilaudid for pain, and Zofran for nausea. She remained hemodynamically stable in the emergency department and will be admitted for  ongoing evaluation and management of post embolization syndrome was suspected endometritis.  Hospital Course:   Sepsis secondary to endometritis  - Sepsis on arrival with tachycardia and leukocytosis   - CT features gas in uterine wall suspicious for infection  - Dr. Glo Herring of OB/GYN consulting and much appreciated  -Interventional radiology reported that Nitrogen gas bubbles are not that uncommon after uterine artery embolization.  not necessarily indicating infection. -On discharge Cipro/Metro for 7 more days (she is allergic to penicillin)..  Postembolization syndrome  - Underwent  embolization of uterine fibroid on 08/12/15 and initially did fine, pain controlled with Norco  - There is a necrotizing uterine mass noted on CT  - continue pain control.   Hypokalemia with EKG changes - Potassium 2.5 on presentation. - this is repleted with oral supplements.  Hypomagnesemia -Repleted with parenteral supplements  Insulin-dependent DM  - Recent diagnosis; discharged from hospital 08/14/15 with Lantus 30 U qHS, Novolog 2-10 U per sliding scale TID with meals, and metformin  - Lantus dose increased to 25 units BID, Novolog dose changed to 10 units with meals  Asthma, moderate persistent  - Stable, not in exacerbation  - Continue prn albuterol MDI   Iron-deficiency anemia  - Likely secondary to menorrhagia, dysfunctional uterine bleeding  - Will hopefully improve now that large fibroid embolized  - Continue to supplement iron   Procedures:  None  Consultations:  IR  OB/GYN  Discharge Exam: Filed Vitals:   08/21/15 0911 08/21/15 1142  BP: 157/90 148/84  Pulse: 101 84  Temp: 98.5 F (36.9 C) 98.6 F (37 C)  Resp:  16   General: Alert and awake, oriented x3, not in any acute distress. HEENT: anicteric sclera, pupils reactive to light and accommodation, EOMI CVS: S1-S2 clear, no murmur rubs or gallops Chest: clear to auscultation bilaterally, no wheezing, rales or rhonchi Abdomen: soft nontender, nondistended, normal bowel sounds, no organomegaly Extremities: no cyanosis, clubbing or edema noted bilaterally Neuro: Cranial nerves II-XII intact, no  focal neurological deficits   Discharge Instructions   Discharge Instructions    Diet - low sodium heart healthy    Complete by:  As directed      Increase activity slowly    Complete by:  As directed           Current Discharge Medication List    START taking these medications   Details  ciprofloxacin (CIPRO) 500 MG tablet Take 1 tablet (500 mg total) by mouth 2 (two) times daily. Qty: 14 tablet, Refills: 0    metroNIDAZOLE (FLAGYL) 500 MG tablet Take 1 tablet (500 mg total) by mouth 3 (three) times daily. Qty: 21 tablet, Refills: 0    oxyCODONE-acetaminophen (ROXICET) 5-325 MG tablet Take 1 tablet by mouth every 4 (four) hours as needed. Qty: 30 tablet, Refills: 0      CONTINUE these medications which have CHANGED   Details  insulin aspart (NOVOLOG) 100 UNIT/ML injection Inject 10 Units into the skin 3 (three) times daily with meals. DX DM2, Code E11.65 Qty: 1 vial, Refills: 12    insulin glargine (LANTUS) 100 UNIT/ML injection Inject 0.25 mLs (25 Units total) into the skin 2 (two) times daily. Dispense insulin pen if approved. Diagnosis E 11.65. Qty: 10 mL, Refills: 0      CONTINUE these medications which have NOT CHANGED   Details  acetaminophen (TYLENOL) 500 MG tablet Take 500-1,000 mg by mouth every 6 (six) hours as needed for moderate pain (patient takes 1-2 depending on  pain level).     albuterol (PROVENTIL HFA;VENTOLIN HFA) 108 (90 BASE) MCG/ACT inhaler Inhale 2 puffs into the lungs every 6 (six) hours as needed for wheezing or shortness of breath. Qty: 1 Inhaler, Refills: 0   Associated Diagnoses: Asthma, mild intermittent, with acute exacerbation    diphenhydrAMINE (BENADRYL) 25 MG tablet Take 50 mg by mouth at bedtime as needed for sleep (sleep).    gabapentin (NEURONTIN) 100 MG capsule TAKE 1 CAPSULE BY MOUTH THREE TIMES A DAY Qty: 90 capsule, Refills: 0    megestrol (MEGACE) 40 MG tablet TAKE ONE (1) TABLET BY MOUTH 3 TIMES DAILY Qty: 90 tablet,  Refills: 1    Multiple Vitamins-Minerals (MULTIVITAMIN & MINERAL PO) Take 1 tablet by mouth daily.    SUMAtriptan (IMITREX) 50 MG tablet Take 1 tablet (50 mg total) by mouth every 2 (two) hours as needed for migraine or headache. May repeat in 2 hours if headache persists or recurs. Do not exceed two doses in 7 day period. Qty: 10 tablet, Refills: 2   Associated Diagnoses: Headache, unspecified headache type    butalbital-acetaminophen-caffeine (FIORICET) 50-325-40 MG tablet Take 1-2 tablets by mouth every 8 (eight) hours as needed for headache. Qty: 20 tablet, Refills: 0   Associated Diagnoses: Occipital headache    docusate sodium (COLACE) 100 MG capsule Take 1 capsule (100 mg total) by mouth 2 (two) times daily. Qty: 20 capsule, Refills: 0    ferrous sulfate 325 (65 FE) MG tablet Take 1 tablet (325 mg total) by mouth 2 (two) times daily with a meal. Qty: 60 tablet, Refills: 0    Insulin Syringe-Needle U-100 25G X 1" 1 ML MISC For 4 times a day insulin SQ, 1 month supply. Diagnosis E11.65 Qty: 30 each, Refills: 0    metFORMIN (GLUCOPHAGE) 850 MG tablet Take 1 tablet (850 mg total) by mouth 2 (two) times daily with a meal. Qty: 60 tablet, Refills: 0    metoprolol (LOPRESSOR) 50 MG tablet Take 1 tablet (50 mg total) by mouth 2 (two) times daily. Qty: 60 tablet, Refills: 0      STOP taking these medications     HYDROcodone-acetaminophen (NORCO/VICODIN) 5-325 MG tablet        Allergies  Allergen Reactions  . Ceftriaxone Anaphylaxis  . Penicillins Shortness Of Breath    Has patient had a PCN reaction causing immediate rash, facial/tongue/throat swelling, SOB or lightheadedness with hypotension: Yes Has patient had a PCN reaction causing severe rash involving mucus membranes or skin necrosis: No Has patient had a PCN reaction that required hospitalization No Has patient had a PCN reaction occurring within the last 10 years: No If all of the above answers are "NO", then may  proceed with Cephalosporin use.   . Shellfish Allergy Anaphylaxis  . Citrus Rash   Follow-up Information    Follow up with Minerva Ends, MD. Go on 08/25/2015.   Specialty:  Family Medicine   Why:  _0 :45pm   Contact information:   [place] [address] [phone]        The results of significant diagnostics from this hospitalization (including imaging, microbiology, ancillary and laboratory) are listed below for reference.    Significant Diagnostic Studies: Dg Chest 2 View  08/18/2015  CLINICAL DATA:  Chest pain for 1 day. Status post uterine fibroid resection yesterday. EXAM: CHEST  2 VIEW COMPARISON:  08/11/2015 and chest CTA dated 08/12/2015. FINDINGS: Normal sized heart. Clear lungs. Skin fold overlying the medial right lung  base. No pneumothorax. No pleural fluid. Mild scoliosis. IMPRESSION: No acute abnormality. Electronically Signed   By: Claudie Revering M.D.   On: 08/18/2015 18:21   Ct Angio Chest Pe W/cm &/or Wo Cm  08/12/2015  CLINICAL DATA:  Shortness of breath for 2 weeks. Admitted with severe vaginal bleeding. EXAM: CT ANGIOGRAPHY CHEST WITH CONTRAST TECHNIQUE: Multidetector CT imaging of the chest was performed using the standard protocol during bolus administration of intravenous contrast. Multiplanar CT image reconstructions and MIPs were obtained to evaluate the vascular anatomy. CONTRAST:  167m OMNIPAQUE IOHEXOL 350 MG/ML SOLN COMPARISON:  Chest x-ray on 08/10/2005 FINDINGS: The central pulmonary arteries are well opacified. Peripheral branch artery opacification is somewhat limited due to timing of contrast bolus and patient body habitus. There is no evidence of pulmonary embolism. The thoracic aorta is normal in caliber. The heart size is normal. No pleural or pericardial fluid identified. Lungs show no evidence of edema, infiltrate or nodule. Mild scarring/ atelectasis present at both lung bases. Focal followup of the anterior right upper lobe  without pneumothorax. No lymphadenopathy identified. In the upper abdomen, there is an incidental mass just superior to the right kidney and likely originating from the right adrenal gland. This lesion measures approximately 5.3 x 4.8 x 4.4 cm and contains areas of macroscopic fat and overall is of low density (-15 to 4 HU) by Hounsfield measurements. There are some partial calcifications anteriorly within the lesion. 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 Review of the MIP images confirms the above findings. IMPRESSION: 1. No evidence of pulmonary embolism or other acute findings in the chest. 2. Incidental 5 cm mass of the right adrenal gland with low density, macroscopic internal fat and areas of anterior calcification. The focal areas of calcification may reflect prior fat necrosis or previous focal hemorrhage. 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 Electronically Signed   By: GAletta EdouardM.D.   On: 08/12/2015 13:41   Ct Abdomen Pelvis W Contrast  08/18/2015  CLINICAL DATA:  Diffuse abdominal pain, including upper abdominal pain and pelvic pain. Uterine fibroids were removed on 08/13/2015. EXAM: CT ABDOMEN AND PELVIS WITH CONTRAST TECHNIQUE: Multidetector CT imaging of the abdomen and pelvis was performed using the standard protocol following bolus administration of intravenous contrast. CONTRAST:  1040mOMNIPAQUE IOHEXOL 300 MG/ML  SOLN COMPARISON:  None. FINDINGS: Mild focal scarring in the lung bases. Mild diffuse fatty infiltration of the liver. There is a focal circumscribed lesion in segment 4 of the liver measuring 2.1 cm diameter. The pattern is not typical for focal fatty infiltration in this may represent focal liver lesion. Follow-up with MRI is suggested. There is a right suprarenal mass lesion, likely arising from the adrenal gland and measuring 5.7 by 4.6 cm. The mass is low-attenuation in contains focal fat and focal calcification. The lesion is likely  to represent a myelolipoma based on the presence of macroscopic fat. This could also be evaluated at MRI. The gallbladder is surgically absent. The pancreas, spleen, left adrenal gland, kidneys, abdominal aorta, inferior vena cava, and retroperitoneal lymph nodes are unremarkable. Stomach, small bowel, and colon are decompressed. No free air or free fluid in the abdomen. Abdominal wall musculature appears intact. Pelvis: There is a large uterine mass containing heterogeneous density and gas. This is measuring about 12.8 x 14.1 x 10.9 cm. There has been recent uterine artery embolization in this may represent changes due to necrosis of the fibroid after embolization. Presence of gas does  raise suspicion for infection. Endometritis is a known complication following uterine artery embolization and may be present. The appendix is normal. No free or loculated pelvic fluid collections. No pelvic lymphadenopathy. Bladder wall is not thickened. Degenerative changes in the lumbar spine with degenerative disc disease at L5-S1. No destructive bone lesions. IMPRESSION: 1. 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 There is gas present within the area of necrosis suggesting possible superinfection and endometritis. 2. Indeterminate circumscribed lesion in segment 4 of the liver, possibly focal fatty infiltration but appearance is not typical. Consider follow-up with elective MRI. 3. Heterogeneous right adrenal gland mass containing macroscopic fat and calcification. 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 This could also be evaluated at the time of MRI. Electronically Signed   By: Lucienne Capers M.D.   On: 08/18/2015 21:48   Ir Angiogram Pelvis Selective Or Supraselective  08/13/2015  INDICATION: Symptomatic uterine fibroids. Patient with persistent uterine daily refractory uterine bleeding. EXAM: 1. SELECTIVE BILATERAL INTERNAL ILIAC  ARTERIOGRAM 2. SELECTIVE BILATERAL ANTERIOR DIVISION INTERNAL ILIAC ARTERIOGRAM 3. SELECTIVE BILATERAL UTERINE ARTERY ARTERIOGRAM AND PERCUTANEOUS PARTICLE EMBOLIZATION. 4. ULTRASOUND GUIDANCE FOR ARTERIAL ACCESS MEDICATIONS: Zofran 4 mg IV, Vancomycin 1.5 g IV; The antibiotic was administered within one hour of the procedure ANESTHESIA/SEDATION: Moderate (conscious) sedation was employed during this procedure. A total of Toradol 30 mg, Versed 6 mg and Fentanyl 300 mcg was administered intravenously. Moderate Sedation Time: 120 minutes. The patient's level of consciousness and vital signs were monitored continuously by radiology nursing throughout the procedure under my direct supervision. CONTRAST:  236m OMNIPAQUE IOHEXOL 300 MG/ML  SOLN FLUOROSCOPY TIME:  38 minutes 24 seconds (49,470mGy) COMPLICATIONS: None immediate. PROCEDURE: Informed consent was obtained from the patient following explanation of the procedure, risks, benefits and alternatives. The patient understands, agrees and consents for the procedure. All questions were addressed. A time out was performed prior to the initiation of the procedure. Maximal barrier sterile technique utilized including caps, mask, sterile gowns, sterile gloves, large sterile drape, hand hygiene, and Betadine prep. The right femoral head was marked fluoroscopically. Under sterile conditions and local anesthesia, the right common femoral artery access was performed with a micropuncture needle. Under direct ultrasound guidance, the right common femoral was accessed with a micropuncture kit. An ultrasound image was saved for documentation purposes. This allowed for placement of a 5-French vascular sheath. A limited arteriogram was performed through the side arm of the sheath confirming appropriate access within the right common femoral artery. The 5-French C2 catheter was utilized to select the contralateral left internal iliac artery. Selective left internal iliac angiogram  was performed. The tortuous left uterine artery was identified. Selective catheterization was performed of the left uterine artery with a microcatheter and micro guide wire. A selective left uterine angiogram was performed. This demonstrated patency of the left uterine artery. Mild diffuse hypervascularity of the enlarged fibroid uterus. Access was adequate for embolization. For embolization, 3 vials of 500 - 700 micron and 2 vials of 700-900 micron Embospheres were injected into the left uterine artery. Post embolization angiogram confirms complete stasis of the left uterine vascular territory. Microcatheter was removed. The C2 catheter was retracted and utilized to select the right internal iliac artery. Selective right internal iliac angiogram was performed. The patent right uterine artery was identified. For selective catheterization, the micro catheter and guidewire were utilized to select the right uterine artery. Selective right uterine angiogram was performed. This demonstrated patency of  the right uterine artery. Catheter position was safe for embolization. Embolization was performed to complete stasis with injection of 2 vials of 500-700 micron Embospheres. Post embolization angiogram confirms complete stasis of the right uterine vascular territory. At this point, all wires, catheters and sheaths were removed from the patient. Hemostasis was achieved at the right groin access site with The patient tolerated the procedure well without immediate post procedural complication. FINDINGS: Conventional branching pattern of the bilateral internal iliac arteries. The bilateral uterine artery origins were noted to be markedly tortuous though lobe both vessels are widely patent supplying a markedly enlarged and myomatous uterus. Completion arteriograms following bilateral uterine artery particle embolization demonstrates a technically excellent result with stasis of flow within the bilateral uterine vascular  territories. IMPRESSION: Successful bilateral uterine artery embolization ([facility]). Electronically Signed   By: [name] M.D.   On: 08/13/2015 12:33   Ir Angiogram Pelvis Selective Or Supraselective  08/13/2015  INDICATION: Symptomatic uterine fibroids. Patient with persistent uterine daily refractory uterine bleeding. EXAM: 1. SELECTIVE BILATERAL INTERNAL ILIAC ARTERIOGRAM 2. SELECTIVE BILATERAL ANTERIOR DIVISION INTERNAL ILIAC ARTERIOGRAM 3. SELECTIVE BILATERAL UTERINE ARTERY ARTERIOGRAM AND PERCUTANEOUS PARTICLE EMBOLIZATION. 4. ULTRASOUND GUIDANCE FOR ARTERIAL ACCESS MEDICATIONS: Zofran 4 mg IV, Vancomycin 1.5 g IV; The antibiotic was administered within one hour of the procedure ANESTHESIA/SEDATION: Moderate (conscious) sedation was employed during this procedure. A total of Toradol 30 mg, Versed 6 mg and Fentanyl 300 mcg was administered intravenously. Moderate Sedation Time: 120 minutes. The patient's level of consciousness and vital signs were monitored continuously by radiology nursing throughout the procedure under my direct supervision. CONTRAST:  259m OMNIPAQUE IOHEXOL 300 MG/ML  SOLN FLUOROSCOPY TIME:  38 minutes 24 seconds (43,893mGy) COMPLICATIONS: None immediate. PROCEDURE: Informed consent was obtained from the patient following explanation of the procedure, risks, benefits and alternatives. The patient understands, agrees and consents for the procedure. All questions were addressed. A time out was performed prior to the initiation of the procedure. Maximal barrier sterile technique utilized including caps, mask, sterile gowns, sterile gloves, large sterile drape, hand hygiene, and Betadine prep. The right femoral head was marked fluoroscopically. Under sterile conditions and local anesthesia, the right common femoral artery access was performed with a micropuncture needle. Under direct ultrasound guidance, the right common femoral was accessed with a micropuncture kit. An ultrasound image was  saved for documentation purposes. This allowed for placement of a 5-French vascular sheath. A limited arteriogram was performed through the side arm of the sheath confirming appropriate access within the right common femoral artery. The 5-French C2 catheter was utilized to select the contralateral left internal iliac artery. Selective left internal iliac angiogram was performed. The tortuous left uterine artery was identified. Selective catheterization was performed of the left uterine artery with a microcatheter and micro guide wire. A selective left uterine angiogram was performed. This demonstrated patency of the left uterine artery. Mild diffuse hypervascularity of the enlarged fibroid uterus. Access was adequate for embolization. For embolization, 3 vials of 500 - 700 micron and 2 vials of 700-900 micron Embospheres were injected into the left uterine artery. Post embolization angiogram confirms complete stasis of the left uterine vascular territory. Microcatheter was removed. The C2 catheter was retracted and utilized to select the right internal iliac artery. Selective right internal iliac angiogram was performed. The patent right uterine artery was identified. For selective catheterization, the micro catheter and guidewire were utilized to select the right uterine artery. Selective right uterine angiogram was performed. This demonstrated  patency of the right uterine artery. Catheter position was safe for embolization. Embolization was performed to complete stasis with injection of 2 vials of 500-700 micron Embospheres. Post embolization angiogram confirms complete stasis of the right uterine vascular territory. At this point, all wires, catheters and sheaths were removed from the patient. Hemostasis was achieved at the right groin access site with The patient tolerated the procedure well without immediate post procedural complication. FINDINGS: Conventional branching pattern of the bilateral internal iliac  arteries. The bilateral uterine artery origins were noted to be markedly tortuous though lobe both vessels are widely patent supplying a markedly enlarged and myomatous uterus. Completion arteriograms following bilateral uterine artery particle embolization demonstrates a technically excellent result with stasis of flow within the bilateral uterine vascular territories. IMPRESSION: Successful bilateral uterine artery embolization ([facility]). Electronically Signed   By: [name] M.D.   On: 08/13/2015 12:33   Ir Angiogram Selective Each Additional Vessel  08/13/2015  INDICATION: Symptomatic uterine fibroids. Patient with persistent uterine daily refractory uterine bleeding. EXAM: 1. SELECTIVE BILATERAL INTERNAL ILIAC ARTERIOGRAM 2. SELECTIVE BILATERAL ANTERIOR DIVISION INTERNAL ILIAC ARTERIOGRAM 3. SELECTIVE BILATERAL UTERINE ARTERY ARTERIOGRAM AND PERCUTANEOUS PARTICLE EMBOLIZATION. 4. ULTRASOUND GUIDANCE FOR ARTERIAL ACCESS MEDICATIONS: Zofran 4 mg IV, Vancomycin 1.5 g IV; The antibiotic was administered within one hour of the procedure ANESTHESIA/SEDATION: Moderate (conscious) sedation was employed during this procedure. A total of Toradol 30 mg, Versed 6 mg and Fentanyl 300 mcg was administered intravenously. Moderate Sedation Time: 120 minutes. The patient's level of consciousness and vital signs were monitored continuously by radiology nursing throughout the procedure under my direct supervision. CONTRAST:  229m OMNIPAQUE IOHEXOL 300 MG/ML  SOLN FLUOROSCOPY TIME:  38 minutes 24 seconds (43,382mGy) COMPLICATIONS: None immediate. PROCEDURE: Informed consent was obtained from the patient following explanation of the procedure, risks, benefits and alternatives. The patient understands, agrees and consents for the procedure. All questions were addressed. A time out was performed prior to the initiation of the procedure. Maximal barrier sterile technique utilized including caps, mask, sterile gowns, sterile  gloves, large sterile drape, hand hygiene, and Betadine prep. The right femoral head was marked fluoroscopically. Under sterile conditions and local anesthesia, the right common femoral artery access was performed with a micropuncture needle. Under direct ultrasound guidance, the right common femoral was accessed with a micropuncture kit. An ultrasound image was saved for documentation purposes. This allowed for placement of a 5-French vascular sheath. A limited arteriogram was performed through the side arm of the sheath confirming appropriate access within the right common femoral artery. The 5-French C2 catheter was utilized to select the contralateral left internal iliac artery. Selective left internal iliac angiogram was performed. The tortuous left uterine artery was identified. Selective catheterization was performed of the left uterine artery with a microcatheter and micro guide wire. A selective left uterine angiogram was performed. This demonstrated patency of the left uterine artery. Mild diffuse hypervascularity of the enlarged fibroid uterus. Access was adequate for embolization. For embolization, 3 vials of 500 - 700 micron and 2 vials of 700-900 micron Embospheres were injected into the left uterine artery. Post embolization angiogram confirms complete stasis of the left uterine vascular territory. Microcatheter was removed. The C2 catheter was retracted and utilized to select the right internal iliac artery. Selective right internal iliac angiogram was performed. The patent right uterine artery was identified. For selective catheterization, the micro catheter and guidewire were utilized to select the right uterine artery. Selective right uterine angiogram was performed.  This demonstrated patency of the right uterine artery. Catheter position was safe for embolization. Embolization was performed to complete stasis with injection of 2 vials of 500-700 micron Embospheres. Post embolization angiogram  confirms complete stasis of the right uterine vascular territory. At this point, all wires, catheters and sheaths were removed from the patient. Hemostasis was achieved at the right groin access site with The patient tolerated the procedure well without immediate post procedural complication. FINDINGS: Conventional branching pattern of the bilateral internal iliac arteries. The bilateral uterine artery origins were noted to be markedly tortuous though lobe both vessels are widely patent supplying a markedly enlarged and myomatous uterus. Completion arteriograms following bilateral uterine artery particle embolization demonstrates a technically excellent result with stasis of flow within the bilateral uterine vascular territories. IMPRESSION: Successful bilateral uterine artery embolization ([facility]). Electronically Signed   By: [name] M.D.   On: 08/13/2015 12:33   Ir Angiogram Selective Each Additional Vessel  08/13/2015  INDICATION: Symptomatic uterine fibroids. Patient with persistent uterine daily refractory uterine bleeding. EXAM: 1. SELECTIVE BILATERAL INTERNAL ILIAC ARTERIOGRAM 2. SELECTIVE BILATERAL ANTERIOR DIVISION INTERNAL ILIAC ARTERIOGRAM 3. SELECTIVE BILATERAL UTERINE ARTERY ARTERIOGRAM AND PERCUTANEOUS PARTICLE EMBOLIZATION. 4. ULTRASOUND GUIDANCE FOR ARTERIAL ACCESS MEDICATIONS: Zofran 4 mg IV, Vancomycin 1.5 g IV; The antibiotic was administered within one hour of the procedure ANESTHESIA/SEDATION: Moderate (conscious) sedation was employed during this procedure. A total of Toradol 30 mg, Versed 6 mg and Fentanyl 300 mcg was administered intravenously. Moderate Sedation Time: 120 minutes. The patient's level of consciousness and vital signs were monitored continuously by radiology nursing throughout the procedure under my direct supervision. CONTRAST:  243m OMNIPAQUE IOHEXOL 300 MG/ML  SOLN FLUOROSCOPY TIME:  38 minutes 24 seconds (42,707mGy) COMPLICATIONS: None immediate. PROCEDURE: Informed  consent was obtained from the patient following explanation of the procedure, risks, benefits and alternatives. The patient understands, agrees and consents for the procedure. All questions were addressed. A time out was performed prior to the initiation of the procedure. Maximal barrier sterile technique utilized including caps, mask, sterile gowns, sterile gloves, large sterile drape, hand hygiene, and Betadine prep. The right femoral head was marked fluoroscopically. Under sterile conditions and local anesthesia, the right common femoral artery access was performed with a micropuncture needle. Under direct ultrasound guidance, the right common femoral was accessed with a micropuncture kit. An ultrasound image was saved for documentation purposes. This allowed for placement of a 5-French vascular sheath. A limited arteriogram was performed through the side arm of the sheath confirming appropriate access within the right common femoral artery. The 5-French C2 catheter was utilized to select the contralateral left internal iliac artery. Selective left internal iliac angiogram was performed. The tortuous left uterine artery was identified. Selective catheterization was performed of the left uterine artery with a microcatheter and micro guide wire. A selective left uterine angiogram was performed. This demonstrated patency of the left uterine artery. Mild diffuse hypervascularity of the enlarged fibroid uterus. Access was adequate for embolization. For embolization, 3 vials of 500 - 700 micron and 2 vials of 700-900 micron Embospheres were injected into the left uterine artery. Post embolization angiogram confirms complete stasis of the left uterine vascular territory. Microcatheter was removed. The C2 catheter was retracted and utilized to select the right internal iliac artery. Selective right internal iliac angiogram was performed. The patent right uterine artery was identified. For selective catheterization, the  micro catheter and guidewire were utilized to select the right uterine artery. Selective right uterine  angiogram was performed. This demonstrated patency of the right uterine artery. Catheter position was safe for embolization. Embolization was performed to complete stasis with injection of 2 vials of 500-700 micron Embospheres. Post embolization angiogram confirms complete stasis of the right uterine vascular territory. At this point, all wires, catheters and sheaths were removed from the patient. Hemostasis was achieved at the right groin access site with The patient tolerated the procedure well without immediate post procedural complication. FINDINGS: Conventional branching pattern of the bilateral internal iliac arteries. The bilateral uterine artery origins were noted to be markedly tortuous though lobe both vessels are widely patent supplying a markedly enlarged and myomatous uterus. Completion arteriograms following bilateral uterine artery particle embolization demonstrates a technically excellent result with stasis of flow within the bilateral uterine vascular territories. IMPRESSION: Successful bilateral uterine artery embolization ([facility]). Electronically Signed   By: [name] M.D.   On: 08/13/2015 12:33   Ir Angiogram Selective Each Additional Vessel  08/13/2015  INDICATION: Symptomatic uterine fibroids. Patient with persistent uterine daily refractory uterine bleeding. EXAM: 1. SELECTIVE BILATERAL INTERNAL ILIAC ARTERIOGRAM 2. SELECTIVE BILATERAL ANTERIOR DIVISION INTERNAL ILIAC ARTERIOGRAM 3. SELECTIVE BILATERAL UTERINE ARTERY ARTERIOGRAM AND PERCUTANEOUS PARTICLE EMBOLIZATION. 4. ULTRASOUND GUIDANCE FOR ARTERIAL ACCESS MEDICATIONS: Zofran 4 mg IV, Vancomycin 1.5 g IV; The antibiotic was administered within one hour of the procedure ANESTHESIA/SEDATION: Moderate (conscious) sedation was employed during this procedure. A total of Toradol 30 mg, Versed 6 mg and Fentanyl 300 mcg was administered  intravenously. Moderate Sedation Time: 120 minutes. The patient's level of consciousness and vital signs were monitored continuously by radiology nursing throughout the procedure under my direct supervision. CONTRAST:  262m OMNIPAQUE IOHEXOL 300 MG/ML  SOLN FLUOROSCOPY TIME:  38 minutes 24 seconds (46,644mGy) COMPLICATIONS: None immediate. PROCEDURE: Informed consent was obtained from the patient following explanation of the procedure, risks, benefits and alternatives. The patient understands, agrees and consents for the procedure. All questions were addressed. A time out was performed prior to the initiation of the procedure. Maximal barrier sterile technique utilized including caps, mask, sterile gowns, sterile gloves, large sterile drape, hand hygiene, and Betadine prep. The right femoral head was marked fluoroscopically. Under sterile conditions and local anesthesia, the right common femoral artery access was performed with a micropuncture needle. Under direct ultrasound guidance, the right common femoral was accessed with a micropuncture kit. An ultrasound image was saved for documentation purposes. This allowed for placement of a 5-French vascular sheath. A limited arteriogram was performed through the side arm of the sheath confirming appropriate access within the right common femoral artery. The 5-French C2 catheter was utilized to select the contralateral left internal iliac artery. Selective left internal iliac angiogram was performed. The tortuous left uterine artery was identified. Selective catheterization was performed of the left uterine artery with a microcatheter and micro guide wire. A selective left uterine angiogram was performed. This demonstrated patency of the left uterine artery. Mild diffuse hypervascularity of the enlarged fibroid uterus. Access was adequate for embolization. For embolization, 3 vials of 500 - 700 micron and 2 vials of 700-900 micron Embospheres were injected into the left  uterine artery. Post embolization angiogram confirms complete stasis of the left uterine vascular territory. Microcatheter was removed. The C2 catheter was retracted and utilized to select the right internal iliac artery. Selective right internal iliac angiogram was performed. The patent right uterine artery was identified. For selective catheterization, the micro catheter and guidewire were utilized to select the right uterine artery. Selective  right uterine angiogram was performed. This demonstrated patency of the right uterine artery. Catheter position was safe for embolization. Embolization was performed to complete stasis with injection of 2 vials of 500-700 micron Embospheres. Post embolization angiogram confirms complete stasis of the right uterine vascular territory. At this point, all wires, catheters and sheaths were removed from the patient. Hemostasis was achieved at the right groin access site with The patient tolerated the procedure well without immediate post procedural complication. FINDINGS: Conventional branching pattern of the bilateral internal iliac arteries. The bilateral uterine artery origins were noted to be markedly tortuous though lobe both vessels are widely patent supplying a markedly enlarged and myomatous uterus. Completion arteriograms following bilateral uterine artery particle embolization demonstrates a technically excellent result with stasis of flow within the bilateral uterine vascular territories. IMPRESSION: Successful bilateral uterine artery embolization ([facility]). Electronically Signed   By: [name] M.D.   On: 08/13/2015 12:33   Ir Angiogram Selective Each Additional Vessel  08/13/2015  INDICATION: Symptomatic uterine fibroids. Patient with persistent uterine daily refractory uterine bleeding. EXAM: 1. SELECTIVE BILATERAL INTERNAL ILIAC ARTERIOGRAM 2. SELECTIVE BILATERAL ANTERIOR DIVISION INTERNAL ILIAC ARTERIOGRAM 3. SELECTIVE BILATERAL UTERINE ARTERY ARTERIOGRAM AND  PERCUTANEOUS PARTICLE EMBOLIZATION. 4. ULTRASOUND GUIDANCE FOR ARTERIAL ACCESS MEDICATIONS: Zofran 4 mg IV, Vancomycin 1.5 g IV; The antibiotic was administered within one hour of the procedure ANESTHESIA/SEDATION: Moderate (conscious) sedation was employed during this procedure. A total of Toradol 30 mg, Versed 6 mg and Fentanyl 300 mcg was administered intravenously. Moderate Sedation Time: 120 minutes. The patient's level of consciousness and vital signs were monitored continuously by radiology nursing throughout the procedure under my direct supervision. CONTRAST:  26m OMNIPAQUE IOHEXOL 300 MG/ML  SOLN FLUOROSCOPY TIME:  38 minutes 24 seconds (47,510mGy) COMPLICATIONS: None immediate. PROCEDURE: Informed consent was obtained from the patient following explanation of the procedure, risks, benefits and alternatives. The patient understands, agrees and consents for the procedure. All questions were addressed. A time out was performed prior to the initiation of the procedure. Maximal barrier sterile technique utilized including caps, mask, sterile gowns, sterile gloves, large sterile drape, hand hygiene, and Betadine prep. The right femoral head was marked fluoroscopically. Under sterile conditions and local anesthesia, the right common femoral artery access was performed with a micropuncture needle. Under direct ultrasound guidance, the right common femoral was accessed with a micropuncture kit. An ultrasound image was saved for documentation purposes. This allowed for placement of a 5-French vascular sheath. A limited arteriogram was performed through the side arm of the sheath confirming appropriate access within the right common femoral artery. The 5-French C2 catheter was utilized to select the contralateral left internal iliac artery. Selective left internal iliac angiogram was performed. The tortuous left uterine artery was identified. Selective catheterization was performed of the left uterine artery with  a microcatheter and micro guide wire. A selective left uterine angiogram was performed. This demonstrated patency of the left uterine artery. Mild diffuse hypervascularity of the enlarged fibroid uterus. Access was adequate for embolization. For embolization, 3 vials of 500 - 700 micron and 2 vials of 700-900 micron Embospheres were injected into the left uterine artery. Post embolization angiogram confirms complete stasis of the left uterine vascular territory. Microcatheter was removed. The C2 catheter was retracted and utilized to select the right internal iliac artery. Selective right internal iliac angiogram was performed. The patent right uterine artery was identified. For selective catheterization, the micro catheter and guidewire were utilized to select the right uterine  artery. Selective right uterine angiogram was performed. This demonstrated patency of the right uterine artery. Catheter position was safe for embolization. Embolization was performed to complete stasis with injection of 2 vials of 500-700 micron Embospheres. Post embolization angiogram confirms complete stasis of the right uterine vascular territory. At this point, all wires, catheters and sheaths were removed from the patient. Hemostasis was achieved at the right groin access site with The patient tolerated the procedure well without immediate post procedural complication. FINDINGS: Conventional branching pattern of the bilateral internal iliac arteries. The bilateral uterine artery origins were noted to be markedly tortuous though lobe both vessels are widely patent supplying a markedly enlarged and myomatous uterus. Completion arteriograms following bilateral uterine artery particle embolization demonstrates a technically excellent result with stasis of flow within the bilateral uterine vascular territories. IMPRESSION: Successful bilateral uterine artery embolization ([facility]). Electronically Signed   By: [name] M.D.   On: 08/13/2015  12:33   Ir Angiogram Follow Up Study  08/13/2015  INDICATION: Symptomatic uterine fibroids. Patient with persistent uterine daily refractory uterine bleeding. EXAM: 1. SELECTIVE BILATERAL INTERNAL ILIAC ARTERIOGRAM 2. SELECTIVE BILATERAL ANTERIOR DIVISION INTERNAL ILIAC ARTERIOGRAM 3. SELECTIVE BILATERAL UTERINE ARTERY ARTERIOGRAM AND PERCUTANEOUS PARTICLE EMBOLIZATION. 4. ULTRASOUND GUIDANCE FOR ARTERIAL ACCESS MEDICATIONS: Zofran 4 mg IV, Vancomycin 1.5 g IV; The antibiotic was administered within one hour of the procedure ANESTHESIA/SEDATION: Moderate (conscious) sedation was employed during this procedure. A total of Toradol 30 mg, Versed 6 mg and Fentanyl 300 mcg was administered intravenously. Moderate Sedation Time: 120 minutes. The patient's level of consciousness and vital signs were monitored continuously by radiology nursing throughout the procedure under my direct supervision. CONTRAST:  21m OMNIPAQUE IOHEXOL 300 MG/ML  SOLN FLUOROSCOPY TIME:  38 minutes 24 seconds (42,778mGy) COMPLICATIONS: None immediate. PROCEDURE: Informed consent was obtained from the patient following explanation of the procedure, risks, benefits and alternatives. The patient understands, agrees and consents for the procedure. All questions were addressed. A time out was performed prior to the initiation of the procedure. Maximal barrier sterile technique utilized including caps, mask, sterile gowns, sterile gloves, large sterile drape, hand hygiene, and Betadine prep. The right femoral head was marked fluoroscopically. Under sterile conditions and local anesthesia, the right common femoral artery access was performed with a micropuncture needle. Under direct ultrasound guidance, the right common femoral was accessed with a micropuncture kit. An ultrasound image was saved for documentation purposes. This allowed for placement of a 5-French vascular sheath. A limited arteriogram was performed through the side arm of the sheath  confirming appropriate access within the right common femoral artery. The 5-French C2 catheter was utilized to select the contralateral left internal iliac artery. Selective left internal iliac angiogram was performed. The tortuous left uterine artery was identified. Selective catheterization was performed of the left uterine artery with a microcatheter and micro guide wire. A selective left uterine angiogram was performed. This demonstrated patency of the left uterine artery. Mild diffuse hypervascularity of the enlarged fibroid uterus. Access was adequate for embolization. For embolization, 3 vials of 500 - 700 micron and 2 vials of 700-900 micron Embospheres were injected into the left uterine artery. Post embolization angiogram confirms complete stasis of the left uterine vascular territory. Microcatheter was removed. The C2 catheter was retracted and utilized to select the right internal iliac artery. Selective right internal iliac angiogram was performed. The patent right uterine artery was identified. For selective catheterization, the micro catheter and guidewire were utilized to select the right  uterine artery. Selective right uterine angiogram was performed. This demonstrated patency of the right uterine artery. Catheter position was safe for embolization. Embolization was performed to complete stasis with injection of 2 vials of 500-700 micron Embospheres. Post embolization angiogram confirms complete stasis of the right uterine vascular territory. At this point, all wires, catheters and sheaths were removed from the patient. Hemostasis was achieved at the right groin access site with The patient tolerated the procedure well without immediate post procedural complication. FINDINGS: Conventional branching pattern of the bilateral internal iliac arteries. The bilateral uterine artery origins were noted to be markedly tortuous though lobe both vessels are widely patent supplying a markedly enlarged and  myomatous uterus. Completion arteriograms following bilateral uterine artery particle embolization demonstrates a technically excellent result with stasis of flow within the bilateral uterine vascular territories. IMPRESSION: Successful bilateral uterine artery embolization ([facility]). Electronically Signed   By: [name] M.D.   On: 08/13/2015 12:33   Ir US Guide Vasc Access Right  08/13/2015  INDICATION: Symptomatic uterine fibroids. Patient with persistent uterine daily refractory uterine bleeding. EXAM: 1. SELECTIVE BILATERAL INTERNAL ILIAC ARTERIOGRAM 2. SELECTIVE BILATERAL ANTERIOR DIVISION INTERNAL ILIAC ARTERIOGRAM 3. SELECTIVE BILATERAL UTERINE ARTERY ARTERIOGRAM AND PERCUTANEOUS PARTICLE EMBOLIZATION. 4. ULTRASOUND GUIDANCE FOR ARTERIAL ACCESS MEDICATIONS: Zofran 4 mg IV, Vancomycin 1.5 g IV; The antibiotic was administered within one hour of the procedure ANESTHESIA/SEDATION: Moderate (conscious) sedation was employed during this procedure. A total of Toradol 30 mg, Versed 6 mg and Fentanyl 300 mcg was administered intravenously. Moderate Sedation Time: 120 minutes. The patient's level of consciousness and vital signs were monitored continuously by radiology nursing throughout the procedure under my direct supervision. CONTRAST:  244m OMNIPAQUE IOHEXOL 300 MG/ML  SOLN FLUOROSCOPY TIME:  38 minutes 24 seconds (47,353mGy) COMPLICATIONS: None immediate. PROCEDURE: Informed consent was obtained from the patient following explanation of the procedure, risks, benefits and alternatives. The patient understands, agrees and consents for the procedure. All questions were addressed. A time out was performed prior to the initiation of the procedure. Maximal barrier sterile technique utilized including caps, mask, sterile gowns, sterile gloves, large sterile drape, hand hygiene, and Betadine prep. The right femoral head was marked fluoroscopically. Under sterile conditions and local anesthesia, the right common  femoral artery access was performed with a micropuncture needle. Under direct ultrasound guidance, the right common femoral was accessed with a micropuncture kit. An ultrasound image was saved for documentation purposes. This allowed for placement of a 5-French vascular sheath. A limited arteriogram was performed through the side arm of the sheath confirming appropriate access within the right common femoral artery. The 5-French C2 catheter was utilized to select the contralateral left internal iliac artery. Selective left internal iliac angiogram was performed. The tortuous left uterine artery was identified. Selective catheterization was performed of the left uterine artery with a microcatheter and micro guide wire. A selective left uterine angiogram was performed. This demonstrated patency of the left uterine artery. Mild diffuse hypervascularity of the enlarged fibroid uterus. Access was adequate for embolization. For embolization, 3 vials of 500 - 700 micron and 2 vials of 700-900 micron Embospheres were injected into the left uterine artery. Post embolization angiogram confirms complete stasis of the left uterine vascular territory. Microcatheter was removed. The C2 catheter was retracted and utilized to select the right internal iliac artery. Selective right internal iliac angiogram was performed. The patent right uterine artery was identified. For selective catheterization, the micro catheter and guidewire were utilized to select  the right uterine artery. Selective right uterine angiogram was performed. This demonstrated patency of the right uterine artery. Catheter position was safe for embolization. Embolization was performed to complete stasis with injection of 2 vials of 500-700 micron Embospheres. Post embolization angiogram confirms complete stasis of the right uterine vascular territory. At this point, all wires, catheters and sheaths were removed from the patient. Hemostasis was achieved at the right  groin access site with The patient tolerated the procedure well without immediate post procedural complication. FINDINGS: Conventional branching pattern of the bilateral internal iliac arteries. The bilateral uterine artery origins were noted to be markedly tortuous though lobe both vessels are widely patent supplying a markedly enlarged and myomatous uterus. Completion arteriograms following bilateral uterine artery particle embolization demonstrates a technically excellent result with stasis of flow within the bilateral uterine vascular territories. IMPRESSION: Successful bilateral uterine artery embolization (U F E). Electronically Signed   By: [name] M.D.   On: 08/13/2015 12:33   Dg Chest Port 1 View  08/11/2015  CLINICAL DATA:  Shortness of breath. EXAM: PORTABLE CHEST 1 VIEW COMPARISON:  01/16/2015 FINDINGS: Mild linear scarring in the right lower lobe. Lungs appear otherwise clear. Cardiac and mediastinal margins appear normal. No pleural effusion identified. IMPRESSION: 1.  No active cardiopulmonary disease is radiographically apparent. 2. Chronic scarring in the right lower lobe. Electronically Signed   By: Van Clines M.D.   On: 08/11/2015 08:03   [place] [facility] [employer]  08/13/2015  INDICATION: Symptomatic uterine fibroids. Patient with persistent uterine daily refractory uterine bleeding. EXAM: 1. SELECTIVE BILATERAL INTERNAL ILIAC ARTERIOGRAM 2. SELECTIVE BILATERAL ANTERIOR DIVISION INTERNAL ILIAC ARTERIOGRAM 3. SELECTIVE BILATERAL UTERINE ARTERY ARTERIOGRAM AND PERCUTANEOUS PARTICLE EMBOLIZATION. 4. ULTRASOUND GUIDANCE FOR ARTERIAL ACCESS MEDICATIONS: Zofran 4 mg IV, Vancomycin 1.5 g IV; The antibiotic was administered within one hour of the procedure ANESTHESIA/SEDATION: Moderate (conscious) sedation was employed during this procedure. A total of Toradol 30 mg, Versed 6 mg and Fentanyl 300 mcg was administered intravenously. Moderate Sedation Time:  120 minutes. The patient's level of consciousness and vital signs were monitored continuously by radiology nursing throughout the procedure under my direct supervision. CONTRAST:  279m OMNIPAQUE IOHEXOL 300 MG/ML  SOLN FLUOROSCOPY TIME:  38 minutes 24 seconds (48,250mGy) COMPLICATIONS: None immediate. PROCEDURE: Informed consent was obtained from the patient following explanation of the procedure, risks, benefits and alternatives. The patient understands, agrees and consents for the procedure. All questions were addressed. A time out was performed prior to the initiation of the procedure. Maximal barrier sterile technique utilized including caps, mask, sterile gowns, sterile gloves, large sterile drape, hand hygiene, and Betadine prep. The right femoral head was marked fluoroscopically. Under sterile conditions and local anesthesia, the right common femoral artery access was performed with a micropuncture needle. Under direct ultrasound guidance, the right common femoral was accessed with a micropuncture kit. An ultrasound image was saved for documentation purposes. This allowed for placement of a 5-French vascular sheath. A limited arteriogram was performed through the side arm of the sheath confirming appropriate access within the right common femoral artery. The 5-French C2 catheter was utilized to select the contralateral left internal iliac artery. Selective left internal iliac angiogram was performed. The tortuous left uterine artery was identified. Selective catheterization was performed of the left uterine artery with a microcatheter and micro guide wire. A selective left uterine angiogram was performed. This demonstrated patency of the left uterine artery. Mild diffuse hypervascularity of  the enlarged fibroid uterus. Access was adequate for embolization. For embolization, 3 vials of 500 - 700 micron and 2 vials of 700-900 micron Embospheres were injected into the left uterine artery. Post embolization  angiogram confirms complete stasis of the left uterine vascular territory. Microcatheter was removed. The C2 catheter was retracted and utilized to select the right internal iliac artery. Selective right internal iliac angiogram was performed. The patent right uterine artery was identified. For selective catheterization, the micro catheter and guidewire were utilized to select the right uterine artery. Selective right uterine angiogram was performed. This demonstrated patency of the right uterine artery. Catheter position was safe for embolization. Embolization was performed to complete stasis with injection of 2 vials of 500-700 micron Embospheres. Post embolization angiogram confirms complete stasis of the right uterine vascular territory. At this point, all wires, catheters and sheaths were removed from the patient. Hemostasis was achieved at the right groin access site with The patient tolerated the procedure well without immediate post procedural complication. FINDINGS: Conventional branching pattern of the bilateral internal iliac arteries. The bilateral uterine artery origins were noted to be markedly tortuous though lobe both vessels are widely patent supplying a markedly enlarged and myomatous uterus. Completion arteriograms following bilateral uterine artery particle embolization demonstrates a technically excellent result with stasis of flow within the bilateral uterine vascular territories. IMPRESSION: Successful bilateral uterine artery embolization (U F E). Electronically Signed   By: [name] M.D.   On: 08/13/2015 12:33    Microbiology: Recent Results (from the past 240 hour(s))  MRSA PCR Screening     Status: None   Collection Time: 08/13/15  5:13 AM  Result Value Ref Range Status   MRSA by PCR NEGATIVE NEGATIVE Final    Comment:        The GeneXpert MRSA Assay (FDA approved for NASAL specimens only), is one component of a comprehensive MRSA colonization surveillance program. It is  not intended to diagnose MRSA infection nor to guide or monitor treatment for MRSA infections.   Urine culture     Status: None   Collection Time: 08/18/15  8:25 PM  Result Value Ref Range Status   Specimen Description URINE, CLEAN CATCH  Final   Special Requests Normal  Final   Culture 7,000 COLONIES/mL INSIGNIFICANT GROWTH  Final   Report Status 08/20/2015 FINAL  Final  Culture, routine-genital     Status: None   Collection Time: 08/18/15 10:50 PM  Result Value Ref Range Status   Specimen Description GENITAL  Final   Special Requests Normal  Final   Culture   Final    NORMAL VAGINAL FLORA Performed at [facility]    Report Status 08/21/2015 FINAL  Final  Blood Culture (routine x 2)     Status: None (Preliminary result)   Collection Time: 08/18/15 11:20 PM  Result Value Ref Range Status   Specimen Description BLOOD RIGHT HAND  Final   Special Requests BOTTLES DRAWN AEROBIC AND ANAEROBIC 5CC  Final   Culture NO GROWTH 1 DAY  Final   Report Status PENDING  Incomplete  Blood Culture (routine x 2)     Status: None (Preliminary result)   Collection Time: 08/18/15 11:25 PM  Result Value Ref Range Status   Specimen Description BLOOD BLOOD RIGHT FOREARM  Final   Special Requests BOTTLES DRAWN AEROBIC AND ANAEROBIC 5CC  Final   Culture NO GROWTH 1 DAY  Final   Report Status PENDING  Incomplete     Labs: Basic  Metabolic Panel:  Recent Labs Lab 08/18/15 1817 08/18/15 1842 08/19/15 0147 08/19/15 0501 08/20/15 0408 08/21/15 0616  NA 141 142  --  141 142 137  K 2.6* 2.5*  --  3.3* 3.5 3.4*  CL 97* 102  --  106 107 105  CO2  --  27  --  _0 GLUCOSE 270* 266*  --  247* 258* 251*  BUN <3* <5*  --  <5* <5* <5*  CREATININE 0.50 0.66  --  0.64 0.51 0.57  CALCIUM  --  8.0*  --  7.8* 8.4* 8.3*  MG  --   --  1.4*  --  1.9  --    Liver Function Tests:  Recent Labs Lab 08/18/15 1842  AST 10*  ALT 11*  ALKPHOS 71  BILITOT 0.9  PROT 5.5*  ALBUMIN 2.3*     Recent Labs Lab 08/18/15 1842  LIPASE 24   No results for input(s): AMMONIA in the last 168 hours. CBC:  Recent Labs Lab 08/18/15 1808 08/18/15 1817 08/19/15 0501  WBC 10.7*  --  10.2  NEUTROABS 7.7  --  7.2  HGB 9.8* 10.9* 9.2*  HCT 30.9* 32.0* 28.8*  MCV 82.0  --  83.0  PLT 368  --  347   Cardiac Enzymes: No results for input(s): CKTOTAL, CKMB, CKMBINDEX, TROPONINI in the last 168 hours. BNP: BNP (last 3 results)  Recent Labs  09/08/14 1345  BNP 192.8*    ProBNP (last 3 results) No results for input(s): PROBNP in the last 8760 hours.  CBG:  Recent Labs Lab 08/20/15 1150 08/20/15 1641 08/20/15 2100 08/21/15 0615 08/21/15 1140  GLUCAP 242* 320* 229* 218* 250*       Signed:  Verlee Monte A MD.  [facility] 08/21/2015, 1:10 PM

## 2015-08-21 NOTE — Plan of Care (Signed)
Problem: Nutrition: Goal: Adequate nutrition will be maintained Outcome: Completed/Met Date Met:  08/21/15 Patient has ate breakfast 100% with no complaints.

## 2015-08-21 NOTE — Progress Notes (Signed)
Patient being discharged home per MD order, All discharge instructions will be given to patient, also with a taxi voucher that was issued from [place].

## 2015-08-23 ENCOUNTER — Ambulatory Visit: Payer: Medicaid Other | Admitting: Obstetrics & Gynecology

## 2015-08-23 ENCOUNTER — Telehealth: Payer: Self-pay | Admitting: Obstetrics & Gynecology

## 2015-08-23 NOTE — Telephone Encounter (Signed)
Patient had an appointment in the GYN clinic today but was recently discharged from the hospital on 08/23/15. No new concerns since discharge.  No intervention needed at this time.  After detailed discussion with patient, the decision was made to move her follow up appointment until 09/07/15 at 3 pm with Dr. Ihor Dow.  She was still given the option to come in today if she desires, but she declined.  She was told to come in or call for any GYN concerns.   Verita Schneiders, MD, [place] Attending Obstetrician & Gynecologist, [place] for [facility]

## 2015-08-24 LAB — CULTURE, BLOOD (ROUTINE X 2)
Culture: NO GROWTH
Culture: NO GROWTH

## 2015-08-25 ENCOUNTER — Ambulatory Visit: Payer: Medicaid Other | Attending: Family Medicine | Admitting: Family Medicine

## 2015-08-25 ENCOUNTER — Encounter: Payer: Self-pay | Admitting: Family Medicine

## 2015-08-25 VITALS — BP 123/79 | HR 122 | Temp 99.1°F | Resp 16 | Ht 70.0 in | Wt 299.0 lb

## 2015-08-25 DIAGNOSIS — E1165 Type 2 diabetes mellitus with hyperglycemia: Secondary | ICD-10-CM | POA: Diagnosis not present

## 2015-08-25 DIAGNOSIS — E118 Type 2 diabetes mellitus with unspecified complications: Secondary | ICD-10-CM

## 2015-08-25 DIAGNOSIS — Z794 Long term (current) use of insulin: Secondary | ICD-10-CM

## 2015-08-25 DIAGNOSIS — IMO0002 Reserved for concepts with insufficient information to code with codable children: Secondary | ICD-10-CM | POA: Insufficient documentation

## 2015-08-25 DIAGNOSIS — H542 Low vision, both eyes: Secondary | ICD-10-CM | POA: Diagnosis not present

## 2015-08-25 DIAGNOSIS — H547 Unspecified visual loss: Secondary | ICD-10-CM | POA: Insufficient documentation

## 2015-08-25 DIAGNOSIS — M79661 Pain in right lower leg: Secondary | ICD-10-CM

## 2015-08-25 DIAGNOSIS — H543 Unqualified visual loss, both eyes: Secondary | ICD-10-CM | POA: Insufficient documentation

## 2015-08-25 LAB — POCT URINALYSIS DIPSTICK
BILIRUBIN UA: NEGATIVE
GLUCOSE UA: 500
Ketones, UA: NEGATIVE
LEUKOCYTES UA: NEGATIVE
NITRITE UA: NEGATIVE
Protein, UA: NEGATIVE
Spec Grav, UA: <=1.005
Urobilinogen, UA: 0.2
pH, UA: 6.5

## 2015-08-25 LAB — GLUCOSE, POCT (MANUAL RESULT ENTRY)
POC Glucose: 381 mg/dl — AB (ref 70–99)
POC Glucose: 422 mg/dl — AB (ref 70–99)

## 2015-08-25 MED ORDER — APIXABAN 5 MG PO TABS: ORAL_TABLET | ORAL | Status: DC

## 2015-08-25 MED ORDER — INSULIN ASPART 100 UNIT/ML ~~LOC~~ SOLN
20.0000 [IU] | Freq: Once | SUBCUTANEOUS | Status: AC
  Administered 2015-08-25: 20 [IU] via SUBCUTANEOUS

## 2015-08-25 NOTE — Patient Instructions (Addendum)
Melinda [name] was seen today for hospitalization follow-up and diabetes.  Diagnoses and all orders for this visit:  Uncontrolled type 2 diabetes mellitus with complication, with long-term current use of insulin (HCC) -     Glucose (CBG) -     POCT urinalysis dipstick -     Ambulatory referral to Ophthalmology -     Microalbumin/Creatinine Ratio, Urine -     insulin aspart (novoLOG) injection 20 Units; Inject 0.2 mLs (20 Units total) into the skin once. -     POCT glucose (manual entry) -     Cancel: Ambulatory referral to Ophthalmology -     Ambulatory referral to [place]  Right calf pain -     apixaban (ELIQUIS) 5 MG TABS tablet; 10 mg twice daily for one week, followed by 5 mg twice daily -     VAS [place] LOWER EXTREMITY VENOUS (DVT); Future  Decreased vision in both eyes -     Cancel: Ambulatory referral to Ophthalmology -     Ambulatory referral to [place]   F/u in 4 weeks for CBG check and R calf pain concerning for DVT   Dr. Adrian Blackwater

## 2015-08-25 NOTE — Progress Notes (Signed)
HFU DM Elevated glucose today  Pt stated not taking insulin since Sunday after discharge from hospital  "unable to see what I am doing" Pain scale #6 Former smoker  No suicidal thoughts in the past two weeks

## 2015-08-25 NOTE — Progress Notes (Signed)
Subjective:  Patient ID: Melinda [name], female    DOB: [birth year]  Age: 50 y.o. MRN: [id number]  CC: Hospitalization Follow-up and Diabetes   HPI [name] presents for   1. CHRONIC DIABETES she was recently hospitalized for DKA  Disease Monitoring  Blood Sugar Ranges: not checking    Polyuria: no   Visual problems: no   Medication Compliance: no, unable to read her insulin doses   Medication Side Effects  Hypoglycemia: no   Preventitive Health Care  Eye Exam: due   Foot Exam: done today   Diet pattern: eating moderate carb   Exercise: no  2. R leg pain: having pain and swelling in R calf. Hospitalized from from 2/9-2/13/17 for DKA, and again from 2/17-2/20 for sepsis, urine and blood cultures negative. She started having pain in her calf the day after her last hospitalization. 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 She stopped the eliquis in December for a thyroid biopsy she had on 07/04/2015 and did not restart it. No CP or SOB.    Social History  Substance Use Topics  . Smoking status: Former Smoker -- 0.50 packs/day for 40 years    Types: Cigarettes    Quit date: 11/03/2014  . Smokeless tobacco: Never Used  . Alcohol Use: No   Outpatient Prescriptions Prior to Visit  Medication Sig Dispense Refill  . acetaminophen (TYLENOL) 500 MG tablet Take 500-1,000 mg by mouth every 6 (six) hours as needed for moderate pain (patient takes 1-2 depending on pain level).     Marland Kitchen albuterol (PROVENTIL HFA;VENTOLIN HFA) 108 (90 BASE) MCG/ACT inhaler Inhale 2 puffs into the lungs every 6 (six) hours as needed for wheezing or shortness of breath. 1 Inhaler 0  . butalbital-acetaminophen-caffeine (FIORICET) 50-325-40 MG tablet Take 1-2 tablets by mouth every 8 (eight) hours as needed for headache. (Patient not taking: Reported on 08/10/2015) 20 tablet 0  . ciprofloxacin (CIPRO) 500 MG tablet Take 1 tablet (500 mg total) by mouth 2 (two) times daily. 14 tablet 0  . diphenhydrAMINE  (BENADRYL) 25 MG tablet Take 50 mg by mouth at bedtime as needed for sleep (sleep).    . docusate sodium (COLACE) 100 MG capsule Take 1 capsule (100 mg total) by mouth 2 (two) times daily. 20 capsule 0  . ferrous sulfate 325 (65 FE) MG tablet Take 1 tablet (325 mg total) by mouth 2 (two) times daily with a meal. 60 tablet 0  . gabapentin (NEURONTIN) 100 MG capsule TAKE 1 CAPSULE BY MOUTH THREE TIMES A DAY 90 capsule 0  . insulin aspart (NOVOLOG) 100 UNIT/ML injection Inject 10 Units into the skin 3 (three) times daily with meals. DX DM2, Code E11.65 1 vial 12  . insulin glargine (LANTUS) 100 UNIT/ML injection Inject 0.25 mLs (25 Units total) into the skin 2 (two) times daily. Dispense insulin pen if approved. Diagnosis E 11.65. 10 mL 0  . Insulin Syringe-Needle U-100 25G X 1" 1 ML MISC For 4 times a day insulin SQ, 1 month supply. Diagnosis E11.65 30 each 0  . megestrol (MEGACE) 40 MG tablet TAKE ONE (1) TABLET BY MOUTH 3 TIMES DAILY 90 tablet 1  . metFORMIN (GLUCOPHAGE) 850 MG tablet Take 1 tablet (850 mg total) by mouth 2 (two) times daily with a meal. 60 tablet 0  . metoprolol (LOPRESSOR) 50 MG tablet Take 1 tablet (50 mg total) by mouth 2 (two) times daily. 60 tablet 0  . metroNIDAZOLE (FLAGYL) 500 MG tablet  Take 1 tablet (500 mg total) by mouth 3 (three) times daily. 21 tablet 0  . Multiple Vitamins-Minerals (MULTIVITAMIN & MINERAL PO) Take 1 tablet by mouth daily.    Marland Kitchen oxyCODONE-acetaminophen (ROXICET) 5-325 MG tablet Take 1 tablet by mouth every 4 (four) hours as needed. 30 tablet 0  . SUMAtriptan (IMITREX) 50 MG tablet Take 1 tablet (50 mg total) by mouth every 2 (two) hours as needed for migraine or headache. [name] repeat in 2 hours if headache persists or recurs. Do not exceed two doses in 7 day period. 10 tablet 2   No facility-administered medications prior to visit.    ROS Review of Systems  Constitutional: Positive for fatigue. Negative for fever and chills.  Eyes: Positive for  visual disturbance.  Respiratory: Negative for shortness of breath.   Cardiovascular: Negative for chest pain.  Gastrointestinal: Negative for abdominal pain and blood in stool.  Endocrine: Positive for polydipsia and polyphagia.  Musculoskeletal: Positive for myalgias. Negative for back pain and arthralgias.  Skin: Negative for rash.  Allergic/Immunologic: Negative for immunocompromised state.  Hematological: Negative for adenopathy. Does not bruise/bleed easily.  Psychiatric/Behavioral: Negative for suicidal ideas and dysphoric mood.    Objective:  BP 123/79 mmHg  Pulse 122  Temp(Src) 99.1 F (37.3 C) (Oral)  Resp 16  Ht 5\' 10"  (1.778 m)  Wt 299 lb (135.626 kg)  BMI 42.90 kg/m2  SpO2 96%  LMP 08/11/2015  BP/Weight 08/25/2015 08/21/2015 [id number]  Systolic BP [id number]  Diastolic BP 79 84 58  Wt. (Lbs) 299 305.2 -  BMI 42.9 43.79 -     Physical Exam  Constitutional: She is oriented to person, place, and time. She appears well-developed and well-nourished. No distress.  HENT:  Head: Normocephalic and atraumatic.  Eyes: Conjunctivae and EOM are normal. Pupils are equal, round, and reactive to light.  Cardiovascular: Normal rate, regular rhythm, normal heart sounds and intact distal pulses.   Pulmonary/Chest: Effort normal and breath sounds normal.  Musculoskeletal: She exhibits edema and tenderness.       Legs: Neurological: She is alert and oriented to person, place, and time.  Skin: Skin is warm and dry. No rash noted.  Psychiatric: She has a normal mood and affect.    Lab Results  Component Value Date   HGBA1C 11.7* 08/11/2015   CBG 422 UA: 500 glucose, neg ketones  20 U of novolog for treatment  Repeat CBG 381   Assessment & Plan:   Qiara was seen today for hospitalization follow-up and diabetes.  Diagnoses and all orders for this visit:  Uncontrolled type 2 diabetes mellitus with complication, with long-term current use of insulin (HCC) -      Glucose (CBG) -     POCT urinalysis dipstick -     Ambulatory referral to Ophthalmology -     Microalbumin/Creatinine Ratio, Urine -     insulin aspart (novoLOG) injection 20 Units; Inject 0.2 mLs (20 Units total) into the skin once. -     POCT glucose (manual entry) -     Cancel: Ambulatory referral to Ophthalmology -     Ambulatory referral to [place]  Right calf pain -     Discontinue: apixaban (ELIQUIS) 5 MG TABS tablet; 10 mg twice daily for one week, followed by 5 mg twice daily -     VAS [place] LOWER EXTREMITY VENOUS (DVT); Future -     apixaban (ELIQUIS) 5 MG TABS tablet; 10 mg twice daily for one week, followed  by 5 mg twice daily  Decreased vision in both eyes -     Cancel: Ambulatory referral to Ophthalmology -     Ambulatory referral to [place]   Follow-up: No Follow-up on file.   Boykin Nearing MD

## 2015-08-26 LAB — MICROALBUMIN / CREATININE URINE RATIO
CREATININE, URINE: 57 mg/dL (ref 20–320)
MICROALB/CREAT RATIO: 37 ug/mg{creat} — AB (ref <30)
Microalb, Ur: 2.1 mg/dL

## 2015-08-28 ENCOUNTER — Telehealth: Payer: Self-pay | Admitting: *Deleted

## 2015-08-28 ENCOUNTER — Encounter: Payer: Self-pay | Admitting: Family Medicine

## 2015-08-28 DIAGNOSIS — M79661 Pain in right lower leg: Secondary | ICD-10-CM | POA: Insufficient documentation

## 2015-08-28 NOTE — Assessment & Plan Note (Signed)
A; uncontrolled diabetes type 2 with complications. With hyperglycemia and visual disturbance today P; Home health to help dose insulin Opthalmology referral Continue current insulin doses

## 2015-08-28 NOTE — Telephone Encounter (Signed)
Pt notified appointment on  March 3,2017 at 09:00 At [facility] lab

## 2015-08-28 NOTE — Assessment & Plan Note (Signed)
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

## 2015-08-30 ENCOUNTER — Ambulatory Visit (HOSPITAL_COMMUNITY): Payer: Medicaid Other

## 2015-08-31 ENCOUNTER — Telehealth: Payer: Self-pay | Admitting: Clinical

## 2015-08-31 ENCOUNTER — Telehealth: Payer: Self-pay | Admitting: *Deleted

## 2015-08-31 NOTE — Telephone Encounter (Signed)
Date of birth verified by pt  Urine culture results given Pt verbalized understanding

## 2015-08-31 NOTE — Telephone Encounter (Signed)
Return pt call, and informed Melinda [name] that she can expect a call from [facility] today, to set up first home health visit; patient expresses understanding and agreement with that plan.

## 2015-08-31 NOTE — Telephone Encounter (Signed)
-----   Message from Boykin Nearing, MD sent at 08/22/2015  2:06 PM EST ----- Urine culture negative

## 2015-09-04 ENCOUNTER — Encounter: Payer: Self-pay | Admitting: Clinical

## 2015-09-04 ENCOUNTER — Ambulatory Visit (HOSPITAL_COMMUNITY)
Admission: RE | Admit: 2015-09-04 | Discharge: 2015-09-04 | Disposition: A | Discharge status: Home / Self Care | Payer: Medicaid Other | Source: Ambulatory Visit | Attending: Family Medicine | Admitting: Family Medicine

## 2015-09-04 DIAGNOSIS — M79661 Pain in right lower leg: Secondary | ICD-10-CM | POA: Diagnosis not present

## 2015-09-04 NOTE — Progress Notes (Signed)
VASCULAR LAB PRELIMINARY  PRELIMINARY  PRELIMINARY  PRELIMINARY  Right lower extremity venous duplex completed.    Preliminary report:  Right:  No evidence of acute DVT or Baker's cyst.  Chronic changes noted in the popliteal vein.  Superficial thrombosis noted in the short saphenous vein.     Tamea Bai, RVT 09/04/2015, 11:09 AM

## 2015-09-04 NOTE — Progress Notes (Signed)
[facility] all say no to accepting Ms. Henion for home health services. [facility] Glassman says they will take Ms. Smeal as patient for home health services, are requesting [facility] referral to [facility] to provide [facility] for ADLs.

## 2015-09-07 ENCOUNTER — Encounter: Payer: Self-pay | Admitting: Obstetrics & Gynecology

## 2015-09-07 ENCOUNTER — Ambulatory Visit (INDEPENDENT_AMBULATORY_CARE_PROVIDER_SITE_OTHER): Payer: Medicaid Other | Admitting: Obstetrics & Gynecology

## 2015-09-07 ENCOUNTER — Ambulatory Visit
Admission: RE | Admit: 2015-09-07 | Discharge: 2015-09-07 | Disposition: A | Discharge status: Home / Self Care | Payer: Medicaid Other | Source: Ambulatory Visit | Attending: Radiology | Admitting: Radiology

## 2015-09-07 VITALS — BP 149/88 | HR 101 | Temp 97.9°F | Ht 70.0 in | Wt 287.6 lb

## 2015-09-07 DIAGNOSIS — D259 Leiomyoma of uterus, unspecified: Secondary | ICD-10-CM

## 2015-09-07 DIAGNOSIS — N939 Abnormal uterine and vaginal bleeding, unspecified: Secondary | ICD-10-CM | POA: Diagnosis present

## 2015-09-07 DIAGNOSIS — D500 Iron deficiency anemia secondary to blood loss (chronic): Secondary | ICD-10-CM | POA: Diagnosis not present

## 2015-09-07 NOTE — Patient Instructions (Signed)
Decrease Megace to 40mg  twice a day for 2 weeks then once a day for 2 weeks then stop.

## 2015-09-07 NOTE — Progress Notes (Signed)
Patient ID: Melinda [name], female   DOB: [birth year], 50 y.o.   MRN: [id number]       Chief Complaint: Post uterine fibroid embolization  Referring Physician(s): Ruthann Cancer / Kennon Rounds  History of Present Illness: Melinda [name] is a 50 y.o. female with past history significant for asthma, pulmonary embolism and recent diagnosis of diabetes who underwent a technically successful bilateral uterine artery embolization on 08/23/2015 for symptomatic uterine fibroids. The patient is unaccompanied and serves as her own historian.  The patient was initially seen in consultation by my interventional radiology partner, Dr. Anselm Pancoast on 12/01/2014 however at that time the decision was made not to pursue uterine fibroid embolization secondary to the submucosal components of the fibroids demonstrated on MRI obtained 12/21/2014.  In February of this past year, the patient patient was admitted to the hospital with diabetic ketoacidosis however was unable to be discharged secondary to persistent and excessive menstrual bleeding.  As such, while on call the weekend of 08/12/2015, I was consulted by Dr. Kennon Rounds and asked to reconsider performing the uterine fibroid embolization given the patient's persistent poor operative candidacy.   As such, the patient underwent a technically successful bilateral uterine artery embolization on 08/23/2015. The patient was discharged the following day with objective improvement in her vaginal bleeding.  Unfortunately, the patient returned to the emergency department on 06/16/2016 with abdominal pain. CT scan performed at that time demonstrated an expected appearance of the uterus following uterine fibroid embolization though given the patient's pain she was started on prophylactic antibiotic therapy. The patient was discharged on 08/21/2015.  The patient's postoperative course has been further complicated by the development of superficial thrombophlebitis within the right lesser saphenous vein seen on  lower previous upper ultrasound performed 09/04/2015 for which the patient has begun on Xarelto.  The patient states her vaginal bleeding has initially improved following the uterine artery embolization. Patient states she continues to experience a very minimal amount of spotting though states this is not foul-smelling. Patient reports mild intermittent left-sided pelvic pain though states this resolves without dedicated intervention. Patient states she has been experiencing diarrhea though attributed this to her continued antibiotic usage. No urinary complaints. No fever or chills.   Past Medical History  Diagnosis Date  . Migraine   . PTSD (post-traumatic stress disorder)   . Asthma   . GERD (gastroesophageal reflux disease)   . Scoliosis   . Arthritis   . ADHD (attention deficit hyperactivity disorder)   . Peripheral neuropathy ([place])   . Iron deficiency anemia due to chronic blood loss 09/09/2014  . Hyperglycemia 09/09/2014  . Low TSH level 09/08/2014  . Menorrhagia 09/08/2014  . Ovarian mass, right 09/09/2014  . PE (pulmonary embolism)   . Lumbar herniated disc   . Multiple thyroid nodules     Past Surgical History  Procedure Laterality Date  . Cholecystectomy      Allergies: Ceftriaxone; Penicillins; Shellfish allergy; and Citrus  Medications: Prior to Admission medications   Medication Sig Start Date End Date Taking? Authorizing Provider  acetaminophen (TYLENOL) 500 MG tablet Take 500-1,000 mg by mouth every 6 (six) hours as needed for moderate pain (patient takes 1-2 depending on pain level).    Yes Historical Provider, MD  albuterol (PROVENTIL HFA;VENTOLIN HFA) 108 (90 BASE) MCG/ACT inhaler Inhale 2 puffs into the lungs every 6 (six) hours as needed for wheezing or shortness of breath. 04/13/15  Yes Josalyn Funches, MD  apixaban (ELIQUIS) 5 MG TABS tablet 10 mg twice daily for one  week, followed by 5 mg twice daily 08/25/15  Yes Josalyn Funches, MD    butalbital-acetaminophen-caffeine (FIORICET) 50-325-40 MG tablet Take 1-2 tablets by mouth every 8 (eight) hours as needed for headache. 04/13/15 04/12/16 Yes Josalyn Funches, MD  diphenhydrAMINE (BENADRYL) 25 MG tablet Take 50 mg by mouth at bedtime as needed for sleep (sleep).   Yes Historical Provider, MD  docusate sodium (COLACE) 100 MG capsule Take 1 capsule (100 mg total) by mouth 2 (two) times daily. 08/14/15  Yes Thurnell Lose, MD  ferrous sulfate 325 (65 FE) MG tablet Take 1 tablet (325 mg total) by mouth 2 (two) times daily with a meal. 08/14/15  Yes Thurnell Lose, MD  gabapentin (NEURONTIN) 100 MG capsule TAKE 1 CAPSULE BY MOUTH THREE TIMES A DAY 08/14/15  Yes Josalyn Funches, MD  insulin aspart (NOVOLOG) 100 UNIT/ML injection Inject 10 Units into the skin 3 (three) times daily with meals. DX DM2, Code E11.65 08/21/15  Yes Verlee Monte, MD  Insulin Syringe-Needle U-100 25G X 1" 1 ML MISC For 4 times a day insulin SQ, 1 month supply. Diagnosis E11.65 08/11/15  Yes Thurnell Lose, MD  megestrol (MEGACE) 40 MG tablet TAKE ONE (1) TABLET BY MOUTH 3 TIMES DAILY 07/17/15  Yes Peggy Constant, MD  metFORMIN (GLUCOPHAGE) 850 MG tablet Take 1 tablet (850 mg total) by mouth 2 (two) times daily with a meal. 08/16/15  Yes Thurnell Lose, MD  metoprolol (LOPRESSOR) 50 MG tablet Take 1 tablet (50 mg total) by mouth 2 (two) times daily. 08/14/15  Yes Thurnell Lose, MD  metroNIDAZOLE (FLAGYL) 500 MG tablet Take 1 tablet (500 mg total) by mouth 3 (three) times daily. 08/21/15  Yes Verlee Monte, MD  Multiple Vitamins-Minerals (MULTIVITAMIN & MINERAL PO) Take 1 tablet by mouth daily.   Yes Historical Provider, MD  SUMAtriptan (IMITREX) 50 MG tablet Take 1 tablet (50 mg total) by mouth every 2 (two) hours as needed for migraine or headache. [name] repeat in 2 hours if headache persists or recurs. Do not exceed two doses in 7 day period. 03/15/15  Yes Josalyn Funches, MD  insulin glargine (LANTUS) 100 UNIT/ML  injection Inject 0.25 mLs (25 Units total) into the skin 2 (two) times daily. Dispense insulin pen if approved. Diagnosis E 11.65. Patient not taking: Reported on 08/25/2015 08/21/15   Verlee Monte, MD  oxyCODONE-acetaminophen (ROXICET) 5-325 MG tablet Take 1 tablet by mouth every 4 (four) hours as needed. Patient not taking: Reported on 09/07/2015 08/21/15   Verlee Monte, MD     Family History  Problem Relation Age of Onset  . Diabetes Mother   . Breast cancer Mother   . CAD Mother   . Hypertension Mother   . Alcohol abuse Father   . Hypertension Father     Social History   Social History  . Marital Status: Single    Spouse Name: N/A  . Number of Children: N/A  . Years of Education: N/A   Social History Main Topics  . Smoking status: Former Smoker -- 0.50 packs/day for 40 years    Types: Cigarettes    Quit date: 11/03/2014  . Smokeless tobacco: Never Used  . Alcohol Use: No  . Drug Use: Yes    Special: Methamphetamines     Comment: 8/24/16per patient does not use methamphetamine  . Sexual Activity: No   Other Topics Concern  . Not on file   Social History Narrative    ECOG Status: 1 - Symptomatic  but completely ambulatory  Review of Systems: A 12 point ROS discussed and pertinent positives are indicated in the HPI above.  All other systems are negative.  Review of Systems  Constitutional: Negative for fever, chills and unexpected weight change.  Respiratory: Negative.   Cardiovascular: Negative.   Gastrointestinal: Positive for diarrhea.  Genitourinary: Negative.   Musculoskeletal: Positive for myalgias.       Right calf pain which she attributes to her recently diagnosed superficial thrombophlebitis.    Vital Signs: BP 146/101 mmHg  Pulse 90  Temp(Src) 98.2 F (36.8 C)  Resp 18  SpO2 99%  LMP 08/11/2015  Physical Exam  Constitutional: She appears well-developed and well-nourished.  Cardiovascular: Normal rate and regular rhythm.   Well healed right  groin access site.  Pulmonary/Chest: Effort normal and breath sounds normal.  Abdominal: Soft. She exhibits no distension. There is no tenderness.  Psychiatric: She has a normal mood and affect.    Mallampati Score:     Imaging: Dg Chest 2 View  08/18/2015  CLINICAL DATA:  Chest pain for 1 day. Status post uterine fibroid resection yesterday. EXAM: CHEST  2 VIEW COMPARISON:  08/11/2015 and chest CTA dated 08/12/2015. FINDINGS: Normal sized heart. Clear lungs. Skin fold overlying the medial right lung base. No pneumothorax. No pleural fluid. Mild scoliosis. IMPRESSION: No acute abnormality. Electronically Signed   By: Claudie Revering M.D.   On: 08/18/2015 18:21   Ct Angio Chest Pe W/cm &/or Wo Cm  08/12/2015  CLINICAL DATA:  Shortness of breath for 2 weeks. Admitted with severe vaginal bleeding. EXAM: CT ANGIOGRAPHY CHEST WITH CONTRAST TECHNIQUE: Multidetector CT imaging of the chest was performed using the standard protocol during bolus administration of intravenous contrast. Multiplanar CT image reconstructions and MIPs were obtained to evaluate the vascular anatomy. CONTRAST:  135m OMNIPAQUE IOHEXOL 350 MG/ML SOLN COMPARISON:  Chest x-ray on 08/10/2005 FINDINGS: The central pulmonary arteries are well opacified. Peripheral branch artery opacification is somewhat limited due to timing of contrast bolus and patient body habitus. There is no evidence of pulmonary embolism. The thoracic aorta is normal in caliber. The heart size is normal. No pleural or pericardial fluid identified. Lungs show no evidence of edema, infiltrate or nodule. Mild scarring/ atelectasis present at both lung bases. Focal followup of the anterior right upper lobe without pneumothorax. No lymphadenopathy identified. In the upper abdomen, there is an incidental mass just superior to the right kidney and likely originating from the right adrenal gland. This lesion measures approximately 5.3 x 4.8 x 4.4 cm and contains areas of  macroscopic fat and overall is of low density (-15 to 4 HU) by Hounsfield measurements. There are some partial calcifications anteriorly within the lesion. 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 Review of the MIP images confirms the above findings. IMPRESSION: 1. No evidence of pulmonary embolism or other acute findings in the chest. 2. Incidental 5 cm mass of the right adrenal gland with low density, macroscopic internal fat and areas of anterior calcification. The focal areas of calcification [name] reflect prior fat necrosis or previous focal hemorrhage. 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 Electronically Signed   By: GAletta EdouardM.D.   On: 08/12/2015 13:41   Ct Abdomen Pelvis W Contrast  08/18/2015  CLINICAL DATA:  Diffuse abdominal pain, including upper abdominal pain and pelvic pain. Uterine fibroids were removed on 08/13/2015. EXAM: CT ABDOMEN AND PELVIS WITH CONTRAST TECHNIQUE: Multidetector CT imaging of the abdomen and pelvis was  performed using the standard protocol following bolus administration of intravenous contrast. CONTRAST:  164m OMNIPAQUE IOHEXOL 300 MG/ML  SOLN COMPARISON:  None. FINDINGS: Mild focal scarring in the lung bases. Mild diffuse fatty infiltration of the liver. There is a focal circumscribed lesion in segment 4 of the liver measuring 2.1 cm diameter. The pattern is not typical for focal fatty infiltration in this [name] represent focal liver lesion. Follow-up with MRI is suggested. There is a right suprarenal mass lesion, likely arising from the adrenal gland and measuring 5.7 by 4.6 cm. The mass is low-attenuation in contains focal fat and focal calcification. The lesion is likely to represent a myelolipoma based on the presence of macroscopic fat. This could also be evaluated at MRI. The gallbladder is surgically absent. The pancreas, spleen, left adrenal gland, kidneys, abdominal aorta, inferior vena cava, and retroperitoneal lymph nodes  are unremarkable. Stomach, small bowel, and colon are decompressed. No free air or free fluid in the abdomen. Abdominal wall musculature appears intact. Pelvis: There is a large uterine mass containing heterogeneous density and gas. This is measuring about 12.8 x 14.1 x 10.9 cm. There has been recent uterine artery embolization in this [name] represent changes due to necrosis of the fibroid after embolization. Presence of gas does raise suspicion for infection. Endometritis is a known complication following uterine artery embolization and [name] be present. The appendix is normal. No free or loculated pelvic fluid collections. No pelvic lymphadenopathy. Bladder wall is not thickened. Degenerative changes in the lumbar spine with degenerative disc disease at L5-S1. No destructive bone lesions. IMPRESSION: 1. 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 There is gas present within the area of necrosis suggesting possible superinfection and endometritis. 2. Indeterminate circumscribed lesion in segment 4 of the liver, possibly focal fatty infiltration but appearance is not typical. Consider follow-up with elective MRI. 3. Heterogeneous right adrenal gland mass containing macroscopic fat and calcification. 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 This could also be evaluated at the time of MRI. Electronically Signed   By: WLucienne CapersM.D.   On: 08/18/2015 21:48   Ir Angiogram Pelvis Selective Or Supraselective  08/13/2015  INDICATION: Symptomatic uterine fibroids. Patient with persistent uterine daily refractory uterine bleeding. EXAM: 1. SELECTIVE BILATERAL INTERNAL ILIAC ARTERIOGRAM 2. SELECTIVE BILATERAL ANTERIOR DIVISION INTERNAL ILIAC ARTERIOGRAM 3. SELECTIVE BILATERAL UTERINE ARTERY ARTERIOGRAM AND PERCUTANEOUS PARTICLE EMBOLIZATION. 4. ULTRASOUND GUIDANCE FOR ARTERIAL ACCESS MEDICATIONS: Zofran 4 mg IV, Vancomycin 1.5 g IV; The  antibiotic was administered within one hour of the procedure ANESTHESIA/SEDATION: Moderate (conscious) sedation was employed during this procedure. A total of Toradol 30 mg, Versed 6 mg and Fentanyl 300 mcg was administered intravenously. Moderate Sedation Time: 120 minutes. The patient's level of consciousness and vital signs were monitored continuously by radiology nursing throughout the procedure under my direct supervision. CONTRAST:  2164mOMNIPAQUE IOHEXOL 300 MG/ML  SOLN FLUOROSCOPY TIME:  38 minutes 24 seconds (4,7,741Gy) COMPLICATIONS: None immediate. PROCEDURE: Informed consent was obtained from the patient following explanation of the procedure, risks, benefits and alternatives. The patient understands, agrees and consents for the procedure. All questions were addressed. A time out was performed prior to the initiation of the procedure. Maximal barrier sterile technique utilized including caps, mask, sterile gowns, sterile gloves, large sterile drape, hand hygiene, and Betadine prep. The right femoral head was marked fluoroscopically. Under sterile conditions and local anesthesia, the right common femoral artery access was performed with a micropuncture  needle. Under direct ultrasound guidance, the right common femoral was accessed with a micropuncture kit. An ultrasound image was saved for documentation purposes. This allowed for placement of a 5-French vascular sheath. A limited arteriogram was performed through the side arm of the sheath confirming appropriate access within the right common femoral artery. The 5-French C2 catheter was utilized to select the contralateral left internal iliac artery. Selective left internal iliac angiogram was performed. The tortuous left uterine artery was identified. Selective catheterization was performed of the left uterine artery with a microcatheter and micro guide wire. A selective left uterine angiogram was performed. This demonstrated patency of the left uterine  artery. Mild diffuse hypervascularity of the enlarged fibroid uterus. Access was adequate for embolization. For embolization, 3 vials of 500 - 700 micron and 2 vials of 700-900 micron Embospheres were injected into the left uterine artery. Post embolization angiogram confirms complete stasis of the left uterine vascular territory. Microcatheter was removed. The C2 catheter was retracted and utilized to select the right internal iliac artery. Selective right internal iliac angiogram was performed. The patent right uterine artery was identified. For selective catheterization, the micro catheter and guidewire were utilized to select the right uterine artery. Selective right uterine angiogram was performed. This demonstrated patency of the right uterine artery. Catheter position was safe for embolization. Embolization was performed to complete stasis with injection of 2 vials of 500-700 micron Embospheres. Post embolization angiogram confirms complete stasis of the right uterine vascular territory. At this point, all wires, catheters and sheaths were removed from the patient. Hemostasis was achieved at the right groin access site with The patient tolerated the procedure well without immediate post procedural complication. FINDINGS: Conventional branching pattern of the bilateral internal iliac arteries. The bilateral uterine artery origins were noted to be markedly tortuous though lobe both vessels are widely patent supplying a markedly enlarged and myomatous uterus. Completion arteriograms following bilateral uterine artery particle embolization demonstrates a technically excellent result with stasis of flow within the bilateral uterine vascular territories. IMPRESSION: Successful bilateral uterine artery embolization (U F E). Electronically Signed   By: [name] M.D.   On: 08/13/2015 12:33   Ir Angiogram Pelvis Selective Or Supraselective  08/13/2015  INDICATION: Symptomatic uterine fibroids. Patient with  persistent uterine daily refractory uterine bleeding. EXAM: 1. SELECTIVE BILATERAL INTERNAL ILIAC ARTERIOGRAM 2. SELECTIVE BILATERAL ANTERIOR DIVISION INTERNAL ILIAC ARTERIOGRAM 3. SELECTIVE BILATERAL UTERINE ARTERY ARTERIOGRAM AND PERCUTANEOUS PARTICLE EMBOLIZATION. 4. ULTRASOUND GUIDANCE FOR ARTERIAL ACCESS MEDICATIONS: Zofran 4 mg IV, Vancomycin 1.5 g IV; The antibiotic was administered within one hour of the procedure ANESTHESIA/SEDATION: Moderate (conscious) sedation was employed during this procedure. A total of Toradol 30 mg, Versed 6 mg and Fentanyl 300 mcg was administered intravenously. Moderate Sedation Time: 120 minutes. The patient's level of consciousness and vital signs were monitored continuously by radiology nursing throughout the procedure under my direct supervision. CONTRAST:  262m OMNIPAQUE IOHEXOL 300 MG/ML  SOLN FLUOROSCOPY TIME:  38 minutes 24 seconds (43,536mGy) COMPLICATIONS: None immediate. PROCEDURE: Informed consent was obtained from the patient following explanation of the procedure, risks, benefits and alternatives. The patient understands, agrees and consents for the procedure. All questions were addressed. A time out was performed prior to the initiation of the procedure. Maximal barrier sterile technique utilized including caps, mask, sterile gowns, sterile gloves, large sterile drape, hand hygiene, and Betadine prep. The right femoral head was marked fluoroscopically. Under sterile conditions and local anesthesia, the right common femoral artery access was performed with  a micropuncture needle. Under direct ultrasound guidance, the right common femoral was accessed with a micropuncture kit. An ultrasound image was saved for documentation purposes. This allowed for placement of a 5-French vascular sheath. A limited arteriogram was performed through the side arm of the sheath confirming appropriate access within the right common femoral artery. The 5-French C2 catheter was utilized  to select the contralateral left internal iliac artery. Selective left internal iliac angiogram was performed. The tortuous left uterine artery was identified. Selective catheterization was performed of the left uterine artery with a microcatheter and micro guide wire. A selective left uterine angiogram was performed. This demonstrated patency of the left uterine artery. Mild diffuse hypervascularity of the enlarged fibroid uterus. Access was adequate for embolization. For embolization, 3 vials of 500 - 700 micron and 2 vials of 700-900 micron Embospheres were injected into the left uterine artery. Post embolization angiogram confirms complete stasis of the left uterine vascular territory. Microcatheter was removed. The C2 catheter was retracted and utilized to select the right internal iliac artery. Selective right internal iliac angiogram was performed. The patent right uterine artery was identified. For selective catheterization, the micro catheter and guidewire were utilized to select the right uterine artery. Selective right uterine angiogram was performed. This demonstrated patency of the right uterine artery. Catheter position was safe for embolization. Embolization was performed to complete stasis with injection of 2 vials of 500-700 micron Embospheres. Post embolization angiogram confirms complete stasis of the right uterine vascular territory. At this point, all wires, catheters and sheaths were removed from the patient. Hemostasis was achieved at the right groin access site with The patient tolerated the procedure well without immediate post procedural complication. FINDINGS: Conventional branching pattern of the bilateral internal iliac arteries. The bilateral uterine artery origins were noted to be markedly tortuous though lobe both vessels are widely patent supplying a markedly enlarged and myomatous uterus. Completion arteriograms following bilateral uterine artery particle embolization demonstrates a  technically excellent result with stasis of flow within the bilateral uterine vascular territories. IMPRESSION: Successful bilateral uterine artery embolization (U F E). Electronically Signed   By: [name] M.D.   On: 08/13/2015 12:33   Ir Angiogram Selective Each Additional Vessel  08/13/2015  INDICATION: Symptomatic uterine fibroids. Patient with persistent uterine daily refractory uterine bleeding. EXAM: 1. SELECTIVE BILATERAL INTERNAL ILIAC ARTERIOGRAM 2. SELECTIVE BILATERAL ANTERIOR DIVISION INTERNAL ILIAC ARTERIOGRAM 3. SELECTIVE BILATERAL UTERINE ARTERY ARTERIOGRAM AND PERCUTANEOUS PARTICLE EMBOLIZATION. 4. ULTRASOUND GUIDANCE FOR ARTERIAL ACCESS MEDICATIONS: Zofran 4 mg IV, Vancomycin 1.5 g IV; The antibiotic was administered within one hour of the procedure ANESTHESIA/SEDATION: Moderate (conscious) sedation was employed during this procedure. A total of Toradol 30 mg, Versed 6 mg and Fentanyl 300 mcg was administered intravenously. Moderate Sedation Time: 120 minutes. The patient's level of consciousness and vital signs were monitored continuously by radiology nursing throughout the procedure under my direct supervision. CONTRAST:  263m OMNIPAQUE IOHEXOL 300 MG/ML  SOLN FLUOROSCOPY TIME:  38 minutes 24 seconds (42,876mGy) COMPLICATIONS: None immediate. PROCEDURE: Informed consent was obtained from the patient following explanation of the procedure, risks, benefits and alternatives. The patient understands, agrees and consents for the procedure. All questions were addressed. A time out was performed prior to the initiation of the procedure. Maximal barrier sterile technique utilized including caps, mask, sterile gowns, sterile gloves, large sterile drape, hand hygiene, and Betadine prep. The right femoral head was marked fluoroscopically. Under sterile conditions and local anesthesia, the right common femoral artery access was  performed with a micropuncture needle. Under direct ultrasound guidance,  the right common femoral was accessed with a micropuncture kit. An ultrasound image was saved for documentation purposes. This allowed for placement of a 5-French vascular sheath. A limited arteriogram was performed through the side arm of the sheath confirming appropriate access within the right common femoral artery. The 5-French C2 catheter was utilized to select the contralateral left internal iliac artery. Selective left internal iliac angiogram was performed. The tortuous left uterine artery was identified. Selective catheterization was performed of the left uterine artery with a microcatheter and micro guide wire. A selective left uterine angiogram was performed. This demonstrated patency of the left uterine artery. Mild diffuse hypervascularity of the enlarged fibroid uterus. Access was adequate for embolization. For embolization, 3 vials of 500 - 700 micron and 2 vials of 700-900 micron Embospheres were injected into the left uterine artery. Post embolization angiogram confirms complete stasis of the left uterine vascular territory. Microcatheter was removed. The C2 catheter was retracted and utilized to select the right internal iliac artery. Selective right internal iliac angiogram was performed. The patent right uterine artery was identified. For selective catheterization, the micro catheter and guidewire were utilized to select the right uterine artery. Selective right uterine angiogram was performed. This demonstrated patency of the right uterine artery. Catheter position was safe for embolization. Embolization was performed to complete stasis with injection of 2 vials of 500-700 micron Embospheres. Post embolization angiogram confirms complete stasis of the right uterine vascular territory. At this point, all wires, catheters and sheaths were removed from the patient. Hemostasis was achieved at the right groin access site with The patient tolerated the procedure well without immediate post procedural  complication. FINDINGS: Conventional branching pattern of the bilateral internal iliac arteries. The bilateral uterine artery origins were noted to be markedly tortuous though lobe both vessels are widely patent supplying a markedly enlarged and myomatous uterus. Completion arteriograms following bilateral uterine artery particle embolization demonstrates a technically excellent result with stasis of flow within the bilateral uterine vascular territories. IMPRESSION: Successful bilateral uterine artery embolization ([facility]). Electronically Signed   By: [name] M.D.   On: 08/13/2015 12:33   Ir Angiogram Selective Each Additional Vessel  08/13/2015  INDICATION: Symptomatic uterine fibroids. Patient with persistent uterine daily refractory uterine bleeding. EXAM: 1. SELECTIVE BILATERAL INTERNAL ILIAC ARTERIOGRAM 2. SELECTIVE BILATERAL ANTERIOR DIVISION INTERNAL ILIAC ARTERIOGRAM 3. SELECTIVE BILATERAL UTERINE ARTERY ARTERIOGRAM AND PERCUTANEOUS PARTICLE EMBOLIZATION. 4. ULTRASOUND GUIDANCE FOR ARTERIAL ACCESS MEDICATIONS: Zofran 4 mg IV, Vancomycin 1.5 g IV; The antibiotic was administered within one hour of the procedure ANESTHESIA/SEDATION: Moderate (conscious) sedation was employed during this procedure. A total of Toradol 30 mg, Versed 6 mg and Fentanyl 300 mcg was administered intravenously. Moderate Sedation Time: 120 minutes. The patient's level of consciousness and vital signs were monitored continuously by radiology nursing throughout the procedure under my direct supervision. CONTRAST:  272m OMNIPAQUE IOHEXOL 300 MG/ML  SOLN FLUOROSCOPY TIME:  38 minutes 24 seconds (40,300mGy) COMPLICATIONS: None immediate. PROCEDURE: Informed consent was obtained from the patient following explanation of the procedure, risks, benefits and alternatives. The patient understands, agrees and consents for the procedure. All questions were addressed. A time out was performed prior to the initiation of the procedure. Maximal  barrier sterile technique utilized including caps, mask, sterile gowns, sterile gloves, large sterile drape, hand hygiene, and Betadine prep. The right femoral head was marked fluoroscopically. Under sterile conditions and local anesthesia, the right common femoral  artery access was performed with a micropuncture needle. Under direct ultrasound guidance, the right common femoral was accessed with a micropuncture kit. An ultrasound image was saved for documentation purposes. This allowed for placement of a 5-French vascular sheath. A limited arteriogram was performed through the side arm of the sheath confirming appropriate access within the right common femoral artery. The 5-French C2 catheter was utilized to select the contralateral left internal iliac artery. Selective left internal iliac angiogram was performed. The tortuous left uterine artery was identified. Selective catheterization was performed of the left uterine artery with a microcatheter and micro guide wire. A selective left uterine angiogram was performed. This demonstrated patency of the left uterine artery. Mild diffuse hypervascularity of the enlarged fibroid uterus. Access was adequate for embolization. For embolization, 3 vials of 500 - 700 micron and 2 vials of 700-900 micron Embospheres were injected into the left uterine artery. Post embolization angiogram confirms complete stasis of the left uterine vascular territory. Microcatheter was removed. The C2 catheter was retracted and utilized to select the right internal iliac artery. Selective right internal iliac angiogram was performed. The patent right uterine artery was identified. For selective catheterization, the micro catheter and guidewire were utilized to select the right uterine artery. Selective right uterine angiogram was performed. This demonstrated patency of the right uterine artery. Catheter position was safe for embolization. Embolization was performed to complete stasis with  injection of 2 vials of 500-700 micron Embospheres. Post embolization angiogram confirms complete stasis of the right uterine vascular territory. At this point, all wires, catheters and sheaths were removed from the patient. Hemostasis was achieved at the right groin access site with The patient tolerated the procedure well without immediate post procedural complication. FINDINGS: Conventional branching pattern of the bilateral internal iliac arteries. The bilateral uterine artery origins were noted to be markedly tortuous though lobe both vessels are widely patent supplying a markedly enlarged and myomatous uterus. Completion arteriograms following bilateral uterine artery particle embolization demonstrates a technically excellent result with stasis of flow within the bilateral uterine vascular territories. IMPRESSION: Successful bilateral uterine artery embolization ([facility]). Electronically Signed   By: [name] M.D.   On: 08/13/2015 12:33   Ir Angiogram Selective Each Additional Vessel  08/13/2015  INDICATION: Symptomatic uterine fibroids. Patient with persistent uterine daily refractory uterine bleeding. EXAM: 1. SELECTIVE BILATERAL INTERNAL ILIAC ARTERIOGRAM 2. SELECTIVE BILATERAL ANTERIOR DIVISION INTERNAL ILIAC ARTERIOGRAM 3. SELECTIVE BILATERAL UTERINE ARTERY ARTERIOGRAM AND PERCUTANEOUS PARTICLE EMBOLIZATION. 4. ULTRASOUND GUIDANCE FOR ARTERIAL ACCESS MEDICATIONS: Zofran 4 mg IV, Vancomycin 1.5 g IV; The antibiotic was administered within one hour of the procedure ANESTHESIA/SEDATION: Moderate (conscious) sedation was employed during this procedure. A total of Toradol 30 mg, Versed 6 mg and Fentanyl 300 mcg was administered intravenously. Moderate Sedation Time: 120 minutes. The patient's level of consciousness and vital signs were monitored continuously by radiology nursing throughout the procedure under my direct supervision. CONTRAST:  271m OMNIPAQUE IOHEXOL 300 MG/ML  SOLN FLUOROSCOPY TIME:  38  minutes 24 seconds (48,841mGy) COMPLICATIONS: None immediate. PROCEDURE: Informed consent was obtained from the patient following explanation of the procedure, risks, benefits and alternatives. The patient understands, agrees and consents for the procedure. All questions were addressed. A time out was performed prior to the initiation of the procedure. Maximal barrier sterile technique utilized including caps, mask, sterile gowns, sterile gloves, large sterile drape, hand hygiene, and Betadine prep. The right femoral head was marked fluoroscopically. Under sterile conditions and local anesthesia, the right  common femoral artery access was performed with a micropuncture needle. Under direct ultrasound guidance, the right common femoral was accessed with a micropuncture kit. An ultrasound image was saved for documentation purposes. This allowed for placement of a 5-French vascular sheath. A limited arteriogram was performed through the side arm of the sheath confirming appropriate access within the right common femoral artery. The 5-French C2 catheter was utilized to select the contralateral left internal iliac artery. Selective left internal iliac angiogram was performed. The tortuous left uterine artery was identified. Selective catheterization was performed of the left uterine artery with a microcatheter and micro guide wire. A selective left uterine angiogram was performed. This demonstrated patency of the left uterine artery. Mild diffuse hypervascularity of the enlarged fibroid uterus. Access was adequate for embolization. For embolization, 3 vials of 500 - 700 micron and 2 vials of 700-900 micron Embospheres were injected into the left uterine artery. Post embolization angiogram confirms complete stasis of the left uterine vascular territory. Microcatheter was removed. The C2 catheter was retracted and utilized to select the right internal iliac artery. Selective right internal iliac angiogram was performed.  The patent right uterine artery was identified. For selective catheterization, the micro catheter and guidewire were utilized to select the right uterine artery. Selective right uterine angiogram was performed. This demonstrated patency of the right uterine artery. Catheter position was safe for embolization. Embolization was performed to complete stasis with injection of 2 vials of 500-700 micron Embospheres. Post embolization angiogram confirms complete stasis of the right uterine vascular territory. At this point, all wires, catheters and sheaths were removed from the patient. Hemostasis was achieved at the right groin access site with The patient tolerated the procedure well without immediate post procedural complication. FINDINGS: Conventional branching pattern of the bilateral internal iliac arteries. The bilateral uterine artery origins were noted to be markedly tortuous though lobe both vessels are widely patent supplying a markedly enlarged and myomatous uterus. Completion arteriograms following bilateral uterine artery particle embolization demonstrates a technically excellent result with stasis of flow within the bilateral uterine vascular territories. IMPRESSION: Successful bilateral uterine artery embolization ([facility]). Electronically Signed   By: [name] M.D.   On: 08/13/2015 12:33   Ir Angiogram Selective Each Additional Vessel  08/13/2015  INDICATION: Symptomatic uterine fibroids. Patient with persistent uterine daily refractory uterine bleeding. EXAM: 1. SELECTIVE BILATERAL INTERNAL ILIAC ARTERIOGRAM 2. SELECTIVE BILATERAL ANTERIOR DIVISION INTERNAL ILIAC ARTERIOGRAM 3. SELECTIVE BILATERAL UTERINE ARTERY ARTERIOGRAM AND PERCUTANEOUS PARTICLE EMBOLIZATION. 4. ULTRASOUND GUIDANCE FOR ARTERIAL ACCESS MEDICATIONS: Zofran 4 mg IV, Vancomycin 1.5 g IV; The antibiotic was administered within one hour of the procedure ANESTHESIA/SEDATION: Moderate (conscious) sedation was employed during this  procedure. A total of Toradol 30 mg, Versed 6 mg and Fentanyl 300 mcg was administered intravenously. Moderate Sedation Time: 120 minutes. The patient's level of consciousness and vital signs were monitored continuously by radiology nursing throughout the procedure under my direct supervision. CONTRAST:  256m OMNIPAQUE IOHEXOL 300 MG/ML  SOLN FLUOROSCOPY TIME:  38 minutes 24 seconds (41,017mGy) COMPLICATIONS: None immediate. PROCEDURE: Informed consent was obtained from the patient following explanation of the procedure, risks, benefits and alternatives. The patient understands, agrees and consents for the procedure. All questions were addressed. A time out was performed prior to the initiation of the procedure. Maximal barrier sterile technique utilized including caps, mask, sterile gowns, sterile gloves, large sterile drape, hand hygiene, and Betadine prep. The right femoral head was marked fluoroscopically. Under sterile conditions and local anesthesia,  the right common femoral artery access was performed with a micropuncture needle. Under direct ultrasound guidance, the right common femoral was accessed with a micropuncture kit. An ultrasound image was saved for documentation purposes. This allowed for placement of a 5-French vascular sheath. A limited arteriogram was performed through the side arm of the sheath confirming appropriate access within the right common femoral artery. The 5-French C2 catheter was utilized to select the contralateral left internal iliac artery. Selective left internal iliac angiogram was performed. The tortuous left uterine artery was identified. Selective catheterization was performed of the left uterine artery with a microcatheter and micro guide wire. A selective left uterine angiogram was performed. This demonstrated patency of the left uterine artery. Mild diffuse hypervascularity of the enlarged fibroid uterus. Access was adequate for embolization. For embolization, 3 vials of  500 - 700 micron and 2 vials of 700-900 micron Embospheres were injected into the left uterine artery. Post embolization angiogram confirms complete stasis of the left uterine vascular territory. Microcatheter was removed. The C2 catheter was retracted and utilized to select the right internal iliac artery. Selective right internal iliac angiogram was performed. The patent right uterine artery was identified. For selective catheterization, the micro catheter and guidewire were utilized to select the right uterine artery. Selective right uterine angiogram was performed. This demonstrated patency of the right uterine artery. Catheter position was safe for embolization. Embolization was performed to complete stasis with injection of 2 vials of 500-700 micron Embospheres. Post embolization angiogram confirms complete stasis of the right uterine vascular territory. At this point, all wires, catheters and sheaths were removed from the patient. Hemostasis was achieved at the right groin access site with The patient tolerated the procedure well without immediate post procedural complication. FINDINGS: Conventional branching pattern of the bilateral internal iliac arteries. The bilateral uterine artery origins were noted to be markedly tortuous though lobe both vessels are widely patent supplying a markedly enlarged and myomatous uterus. Completion arteriograms following bilateral uterine artery particle embolization demonstrates a technically excellent result with stasis of flow within the bilateral uterine vascular territories. IMPRESSION: Successful bilateral uterine artery embolization ([facility]). Electronically Signed   By: [name] M.D.   On: 08/13/2015 12:33   Ir Angiogram Follow Up Study  08/13/2015  INDICATION: Symptomatic uterine fibroids. Patient with persistent uterine daily refractory uterine bleeding. EXAM: 1. SELECTIVE BILATERAL INTERNAL ILIAC ARTERIOGRAM 2. SELECTIVE BILATERAL ANTERIOR DIVISION INTERNAL  ILIAC ARTERIOGRAM 3. SELECTIVE BILATERAL UTERINE ARTERY ARTERIOGRAM AND PERCUTANEOUS PARTICLE EMBOLIZATION. 4. ULTRASOUND GUIDANCE FOR ARTERIAL ACCESS MEDICATIONS: Zofran 4 mg IV, Vancomycin 1.5 g IV; The antibiotic was administered within one hour of the procedure ANESTHESIA/SEDATION: Moderate (conscious) sedation was employed during this procedure. A total of Toradol 30 mg, Versed 6 mg and Fentanyl 300 mcg was administered intravenously. Moderate Sedation Time: 120 minutes. The patient's level of consciousness and vital signs were monitored continuously by radiology nursing throughout the procedure under my direct supervision. CONTRAST:  273m OMNIPAQUE IOHEXOL 300 MG/ML  SOLN FLUOROSCOPY TIME:  38 minutes 24 seconds (46,767mGy) COMPLICATIONS: None immediate. PROCEDURE: Informed consent was obtained from the patient following explanation of the procedure, risks, benefits and alternatives. The patient understands, agrees and consents for the procedure. All questions were addressed. A time out was performed prior to the initiation of the procedure. Maximal barrier sterile technique utilized including caps, mask, sterile gowns, sterile gloves, large sterile drape, hand hygiene, and Betadine prep. The right femoral head was marked fluoroscopically. Under sterile conditions and local  anesthesia, the right common femoral artery access was performed with a micropuncture needle. Under direct ultrasound guidance, the right common femoral was accessed with a micropuncture kit. An ultrasound image was saved for documentation purposes. This allowed for placement of a 5-French vascular sheath. A limited arteriogram was performed through the side arm of the sheath confirming appropriate access within the right common femoral artery. The 5-French C2 catheter was utilized to select the contralateral left internal iliac artery. Selective left internal iliac angiogram was performed. The tortuous left uterine artery was identified.  Selective catheterization was performed of the left uterine artery with a microcatheter and micro guide wire. A selective left uterine angiogram was performed. This demonstrated patency of the left uterine artery. Mild diffuse hypervascularity of the enlarged fibroid uterus. Access was adequate for embolization. For embolization, 3 vials of 500 - 700 micron and 2 vials of 700-900 micron Embospheres were injected into the left uterine artery. Post embolization angiogram confirms complete stasis of the left uterine vascular territory. Microcatheter was removed. The C2 catheter was retracted and utilized to select the right internal iliac artery. Selective right internal iliac angiogram was performed. The patent right uterine artery was identified. For selective catheterization, the micro catheter and guidewire were utilized to select the right uterine artery. Selective right uterine angiogram was performed. This demonstrated patency of the right uterine artery. Catheter position was safe for embolization. Embolization was performed to complete stasis with injection of 2 vials of 500-700 micron Embospheres. Post embolization angiogram confirms complete stasis of the right uterine vascular territory. At this point, all wires, catheters and sheaths were removed from the patient. Hemostasis was achieved at the right groin access site with The patient tolerated the procedure well without immediate post procedural complication. FINDINGS: Conventional branching pattern of the bilateral internal iliac arteries. The bilateral uterine artery origins were noted to be markedly tortuous though lobe both vessels are widely patent supplying a markedly enlarged and myomatous uterus. Completion arteriograms following bilateral uterine artery particle embolization demonstrates a technically excellent result with stasis of flow within the bilateral uterine vascular territories. IMPRESSION: Successful bilateral uterine artery  embolization ([facility]). Electronically Signed   By: [name] M.D.   On: 08/13/2015 12:33   Ir US Guide Vasc Access Right  08/13/2015  INDICATION: Symptomatic uterine fibroids. Patient with persistent uterine daily refractory uterine bleeding. EXAM: 1. SELECTIVE BILATERAL INTERNAL ILIAC ARTERIOGRAM 2. SELECTIVE BILATERAL ANTERIOR DIVISION INTERNAL ILIAC ARTERIOGRAM 3. SELECTIVE BILATERAL UTERINE ARTERY ARTERIOGRAM AND PERCUTANEOUS PARTICLE EMBOLIZATION. 4. ULTRASOUND GUIDANCE FOR ARTERIAL ACCESS MEDICATIONS: Zofran 4 mg IV, Vancomycin 1.5 g IV; The antibiotic was administered within one hour of the procedure ANESTHESIA/SEDATION: Moderate (conscious) sedation was employed during this procedure. A total of Toradol 30 mg, Versed 6 mg and Fentanyl 300 mcg was administered intravenously. Moderate Sedation Time: 120 minutes. The patient's level of consciousness and vital signs were monitored continuously by radiology nursing throughout the procedure under my direct supervision. CONTRAST:  236m OMNIPAQUE IOHEXOL 300 MG/ML  SOLN FLUOROSCOPY TIME:  38 minutes 24 seconds (45,681mGy) COMPLICATIONS: None immediate. PROCEDURE: Informed consent was obtained from the patient following explanation of the procedure, risks, benefits and alternatives. The patient understands, agrees and consents for the procedure. All questions were addressed. A time out was performed prior to the initiation of the procedure. Maximal barrier sterile technique utilized including caps, mask, sterile gowns, sterile gloves, large sterile drape, hand hygiene, and Betadine prep. The right femoral head was marked fluoroscopically. Under sterile conditions  and local anesthesia, the right common femoral artery access was performed with a micropuncture needle. Under direct ultrasound guidance, the right common femoral was accessed with a micropuncture kit. An ultrasound image was saved for documentation purposes. This allowed for placement of a 5-French  vascular sheath. A limited arteriogram was performed through the side arm of the sheath confirming appropriate access within the right common femoral artery. The 5-French C2 catheter was utilized to select the contralateral left internal iliac artery. Selective left internal iliac angiogram was performed. The tortuous left uterine artery was identified. Selective catheterization was performed of the left uterine artery with a microcatheter and micro guide wire. A selective left uterine angiogram was performed. This demonstrated patency of the left uterine artery. Mild diffuse hypervascularity of the enlarged fibroid uterus. Access was adequate for embolization. For embolization, 3 vials of 500 - 700 micron and 2 vials of 700-900 micron Embospheres were injected into the left uterine artery. Post embolization angiogram confirms complete stasis of the left uterine vascular territory. Microcatheter was removed. The C2 catheter was retracted and utilized to select the right internal iliac artery. Selective right internal iliac angiogram was performed. The patent right uterine artery was identified. For selective catheterization, the micro catheter and guidewire were utilized to select the right uterine artery. Selective right uterine angiogram was performed. This demonstrated patency of the right uterine artery. Catheter position was safe for embolization. Embolization was performed to complete stasis with injection of 2 vials of 500-700 micron Embospheres. Post embolization angiogram confirms complete stasis of the right uterine vascular territory. At this point, all wires, catheters and sheaths were removed from the patient. Hemostasis was achieved at the right groin access site with The patient tolerated the procedure well without immediate post procedural complication. FINDINGS: Conventional branching pattern of the bilateral internal iliac arteries. The bilateral uterine artery origins were noted to be markedly  tortuous though lobe both vessels are widely patent supplying a markedly enlarged and myomatous uterus. Completion arteriograms following bilateral uterine artery particle embolization demonstrates a technically excellent result with stasis of flow within the bilateral uterine vascular territories. IMPRESSION: Successful bilateral uterine artery embolization ([facility]). Electronically Signed   By: [name] M.D.   On: 08/13/2015 12:33   Dg Chest Port 1 View  08/11/2015  CLINICAL DATA:  Shortness of breath. EXAM: PORTABLE CHEST 1 VIEW COMPARISON:  01/16/2015 FINDINGS: Mild linear scarring in the right lower lobe. Lungs appear otherwise clear. Cardiac and mediastinal margins appear normal. No pleural effusion identified. IMPRESSION: 1.  No active cardiopulmonary disease is radiographically apparent. 2. Chronic scarring in the right lower lobe. Electronically Signed   By: Van Clines M.D.   On: 08/11/2015 08:03   [place] Guide [employer]  08/13/2015  INDICATION: Symptomatic uterine fibroids. Patient with persistent uterine daily refractory uterine bleeding. EXAM: 1. SELECTIVE BILATERAL INTERNAL ILIAC ARTERIOGRAM 2. SELECTIVE BILATERAL ANTERIOR DIVISION INTERNAL ILIAC ARTERIOGRAM 3. SELECTIVE BILATERAL UTERINE ARTERY ARTERIOGRAM AND PERCUTANEOUS PARTICLE EMBOLIZATION. 4. ULTRASOUND GUIDANCE FOR ARTERIAL ACCESS MEDICATIONS: Zofran 4 mg IV, Vancomycin 1.5 g IV; The antibiotic was administered within one hour of the procedure ANESTHESIA/SEDATION: Moderate (conscious) sedation was employed during this procedure. A total of Toradol 30 mg, Versed 6 mg and Fentanyl 300 mcg was administered intravenously. Moderate Sedation Time: 120 minutes. The patient's level of consciousness and vital signs were monitored continuously by radiology nursing throughout the procedure under my direct supervision. CONTRAST:  253m OMNIPAQUE IOHEXOL 300  MG/ML  SOLN FLUOROSCOPY TIME:  38 minutes 24  seconds (7,939 mGy) COMPLICATIONS: None immediate. PROCEDURE: Informed consent was obtained from the patient following explanation of the procedure, risks, benefits and alternatives. The patient understands, agrees and consents for the procedure. All questions were addressed. A time out was performed prior to the initiation of the procedure. Maximal barrier sterile technique utilized including caps, mask, sterile gowns, sterile gloves, large sterile drape, hand hygiene, and Betadine prep. The right femoral head was marked fluoroscopically. Under sterile conditions and local anesthesia, the right common femoral artery access was performed with a micropuncture needle. Under direct ultrasound guidance, the right common femoral was accessed with a micropuncture kit. An ultrasound image was saved for documentation purposes. This allowed for placement of a 5-French vascular sheath. A limited arteriogram was performed through the side arm of the sheath confirming appropriate access within the right common femoral artery. The 5-French C2 catheter was utilized to select the contralateral left internal iliac artery. Selective left internal iliac angiogram was performed. The tortuous left uterine artery was identified. Selective catheterization was performed of the left uterine artery with a microcatheter and micro guide wire. A selective left uterine angiogram was performed. This demonstrated patency of the left uterine artery. Mild diffuse hypervascularity of the enlarged fibroid uterus. Access was adequate for embolization. For embolization, 3 vials of 500 - 700 micron and 2 vials of 700-900 micron Embospheres were injected into the left uterine artery. Post embolization angiogram confirms complete stasis of the left uterine vascular territory. Microcatheter was removed. The C2 catheter was retracted and utilized to select the right internal iliac artery. Selective right internal iliac angiogram was performed. The patent  right uterine artery was identified. For selective catheterization, the micro catheter and guidewire were utilized to select the right uterine artery. Selective right uterine angiogram was performed. This demonstrated patency of the right uterine artery. Catheter position was safe for embolization. Embolization was performed to complete stasis with injection of 2 vials of 500-700 micron Embospheres. Post embolization angiogram confirms complete stasis of the right uterine vascular territory. At this point, all wires, catheters and sheaths were removed from the patient. Hemostasis was achieved at the right groin access site with The patient tolerated the procedure well without immediate post procedural complication. FINDINGS: Conventional branching pattern of the bilateral internal iliac arteries. The bilateral uterine artery origins were noted to be markedly tortuous though lobe both vessels are widely patent supplying a markedly enlarged and myomatous uterus. Completion arteriograms following bilateral uterine artery particle embolization demonstrates a technically excellent result with stasis of flow within the bilateral uterine vascular territories. IMPRESSION: Successful bilateral uterine artery embolization (U F E). Electronically Signed   By: [name] M.D.   On: 08/13/2015 12:33    Labs:  CBC:  Recent Labs  08/12/15 0935  08/14/15 0607 08/18/15 1808 08/18/15 1817 08/19/15 0501  WBC 15.9*  --  9.6 10.7*  --  10.2  HGB 10.8*  < > 9.9* 9.8* 10.9* 9.2*  HCT 33.2*  < > 30.2* 30.9* 32.0* 28.8*  PLT 248  --  215 368  --  347  < > = values in this interval not displayed.  COAGS:  Recent Labs  08/13/15 0357 08/19/15 0147  INR 1.27 1.43  APTT  --  29    BMP:  Recent Labs  08/18/15 1842 08/19/15 0501 08/20/15 0408 08/21/15 0616  NA 142 141 142 137  K 2.5* 3.3* 3.5 3.4*  CL 102 106 107  105  CO2 '27 23 25 24  ' GLUCOSE 266* 247* 258* 251*  BUN <5* <5* <5* <5*  CALCIUM 8.0* 7.8*  8.4* 8.3*  CREATININE 0.66 0.64 0.51 0.57  GFRNONAA >60 >60 >60 >60  GFRAA >60 >60 >60 >60    LIVER FUNCTION TESTS:  Recent Labs  09/08/14 0147 11/09/14 0310 08/11/15 0352 08/18/15 1842  BILITOT 0.5 1.0 0.9 0.9  AST 14 57* 13* 10*  ALT 11 11* 16 11*  ALKPHOS 73 75 92 71  PROT 7.2 6.1* 6.0* 5.5*  ALBUMIN 4.0 3.1* 3.2* 2.3*   Assessment and Plan:  Melinda [name] is a 50 y.o. female with past history significant for asthma, pulmonary embolism and recent diagnosis of diabetes who underwent a technically successful bilateral uterine artery embolization on 08/23/2015 for symptomatic uterine fibroids.   Unfortunately the patient's postoperative course has been complicated by postprocedural abdominal and pelvic pain for which the patient was hospitalized from 12/17 through 06/19/2016, though her symptoms appear to have been markedly improved following prophylactic antibiotic and pain medication regimen.  The patient's postoperative course has been further complicated by the development of superficial thrombophlebitis within the right lesser saphenous vein seen on lower previous upper ultrasound performed 09/04/2015 for which the patient has begun on Xarelto.  Despite this rocky postoperative course, the patient's vaginal bleeding has markedly improved following the uterine fibroid embolization and she is overall pleased with the procedure.  Prolonged conversations were held with the patient regarding the expected natural history of the fibroids as they undergo involution during the next 3-6 months. Patient demonstrated fair understanding of this conversation.  We will call the patient in approximately 3 months to insure continued recovery from the procedure. The patient will be seen in follow-up consultation in approximately 6 months following the procedure (August 2017). A post procedural MRI will only be obtained if the patient were to develop persistent or recurrent symptoms.  The patient was  encouraged to call the interventional radiology clinic with any interval questions or concerns.   A copy of this report was sent to the requesting provider on this date.  Electronically Signed: [name] 09/07/2015, 2:30 PM   I spent a total of 15 Minutes in face to face in clinical consultation, greater than 50% of which was counseling/coordinating care for postprocedural care following uterine fibroid embolization

## 2015-09-07 NOTE — Progress Notes (Signed)
No menstrual cycle since UFE 08/13/15; just spotting.  Complains of nausea and diarrhea (still taking Flagyl).  Not sleeping well.  Thinks this is due to blood sugar not being normal yet.  Diagnosed as diabetic 08/10/15 with DKA.  Complains of left-sided abdominal pain since last night that woke her from sleep at 0130.   Brita Romp, RN

## 2015-09-07 NOTE — Progress Notes (Signed)
Patient ID: Melinda [name], female   DOB: Dec 06, 1965, 50 y.o.   MRN: [id number] History:  50 y.o. G1P0101 here today for f/u of AUB. Pt s/p [place] 08/13/2015.  She reports that the bleeding is '1000 times better'  She was seen by interventional rad earlier today. They will see her back in 6 months per pt.  Since last visit pt has had a DVT.  She was los dx;d with DM and has had multiple med problems but, she reports feeling better now.  GLc still poorly controlled but, pt reports that 'now th tI can see, I can adjust my insulin'   The following portions of the patient's history were reviewed and updated as appropriate: allergies, current medications, past family history, past medical history, past social history, past surgical history and problem list.  Review of Systems:  Pertinent items are noted in HPI.  Objective:  Physical Exam Blood pressure 149/88, pulse 101, temperature 97.9 F (36.6 C), temperature source Oral, height 5\' 10"  (1.778 m), weight 287 lb 9.6 oz (130.455 kg), last menstrual period 08/11/2015. Gen: NAD Exam deferred  Labs and Imaging  Assessment & Plan:  AUB- improved after [place]. Megace- will wean.  Pt to decrease to 40mg  bid for 2 weeks then 40mg  daily for 2 weeks then d/c F/u in 3 months or sooner prn  Pt reports that her CBC was done and she has the results at home.  She will f/u with nursing to give the resutls.  Allia Wiltsey L. Harraway-Smith, M.D., Cherlynn June

## 2015-09-13 ENCOUNTER — Telehealth: Payer: Self-pay | Admitting: Family Medicine

## 2015-09-13 ENCOUNTER — Other Ambulatory Visit: Payer: Self-pay | Admitting: Family Medicine

## 2015-09-13 NOTE — Telephone Encounter (Signed)
Paperwork for [facility] in [place] was submitted by PCP, there is a part on page 1 which states "if patient is medically stable", this part was not completed.Marland KitchenMarland KitchenMarland KitchenMarland Kitchenplease resend paperwork to [facility], but if further clarification needs to be discussed contact Tiffany at [facility]

## 2015-09-25 ENCOUNTER — Ambulatory Visit: Payer: Medicaid Other | Admitting: Family Medicine

## 2015-09-27 ENCOUNTER — Other Ambulatory Visit: Payer: Self-pay | Admitting: Family Medicine

## 2015-09-27 DIAGNOSIS — Z1231 Encounter for screening mammogram for malignant neoplasm of breast: Secondary | ICD-10-CM

## 2015-09-28 ENCOUNTER — Other Ambulatory Visit: Payer: Self-pay

## 2015-09-28 DIAGNOSIS — Z1231 Encounter for screening mammogram for malignant neoplasm of breast: Secondary | ICD-10-CM

## 2015-10-05 ENCOUNTER — Telehealth: Payer: Self-pay | Admitting: Clinical

## 2015-10-06 ENCOUNTER — Encounter: Payer: Medicaid Other | Attending: Internal Medicine | Admitting: *Deleted

## 2015-10-06 ENCOUNTER — Encounter: Payer: Self-pay | Admitting: *Deleted

## 2015-10-06 VITALS — Ht 70.0 in | Wt 291.0 lb

## 2015-10-06 DIAGNOSIS — E118 Type 2 diabetes mellitus with unspecified complications: Secondary | ICD-10-CM

## 2015-10-06 DIAGNOSIS — E1165 Type 2 diabetes mellitus with hyperglycemia: Secondary | ICD-10-CM | POA: Insufficient documentation

## 2015-10-06 NOTE — Telephone Encounter (Signed)
Error

## 2015-10-06 NOTE — Patient Instructions (Signed)
Plan:  Aim for 3 Carb Choices per meal (45 grams) +/- 1 either way  Aim for 0-2 Carbs per snack if hungry  Include protein in moderation with your meals and snacks Continue reading food labels for Total Carbohydrate of foods Continue with your activity level daily as tolerated Continue checking BG at alternate times per day   Continue taking diabetes medications  as directed by MD

## 2015-10-09 ENCOUNTER — Ambulatory Visit: Payer: Medicaid Other | Attending: Family Medicine | Admitting: Family Medicine

## 2015-10-09 ENCOUNTER — Encounter: Payer: Self-pay | Admitting: Family Medicine

## 2015-10-09 VITALS — BP 117/80 | HR 93 | Temp 98.6°F | Resp 16 | Ht 70.0 in | Wt 290.0 lb

## 2015-10-09 DIAGNOSIS — Z794 Long term (current) use of insulin: Secondary | ICD-10-CM | POA: Diagnosis not present

## 2015-10-09 DIAGNOSIS — D500 Iron deficiency anemia secondary to blood loss (chronic): Secondary | ICD-10-CM | POA: Diagnosis not present

## 2015-10-09 DIAGNOSIS — Z7901 Long term (current) use of anticoagulants: Secondary | ICD-10-CM | POA: Insufficient documentation

## 2015-10-09 DIAGNOSIS — Z79899 Other long term (current) drug therapy: Secondary | ICD-10-CM | POA: Insufficient documentation

## 2015-10-09 DIAGNOSIS — Z87891 Personal history of nicotine dependence: Secondary | ICD-10-CM | POA: Diagnosis not present

## 2015-10-09 DIAGNOSIS — Z114 Encounter for screening for human immunodeficiency virus [HIV]: Secondary | ICD-10-CM | POA: Insufficient documentation

## 2015-10-09 DIAGNOSIS — Z86711 Personal history of pulmonary embolism: Secondary | ICD-10-CM

## 2015-10-09 DIAGNOSIS — I82401 Acute embolism and thrombosis of unspecified deep veins of right lower extremity: Secondary | ICD-10-CM | POA: Diagnosis not present

## 2015-10-09 DIAGNOSIS — E118 Type 2 diabetes mellitus with unspecified complications: Secondary | ICD-10-CM | POA: Insufficient documentation

## 2015-10-09 DIAGNOSIS — I824Z1 Acute embolism and thrombosis of unspecified deep veins of right distal lower extremity: Secondary | ICD-10-CM

## 2015-10-09 LAB — HIV ANTIBODY (ROUTINE TESTING W REFLEX): HIV 1&2 Ab, 4th Generation: NONREACTIVE

## 2015-10-09 LAB — FERRITIN: FERRITIN: 35 ng/mL (ref 10–232)

## 2015-10-09 LAB — CBC
HCT: 40.6 % (ref 35.0–45.0)
Hemoglobin: 13.3 g/dL (ref 11.7–15.5)
MCH: 27.3 pg (ref 27.0–33.0)
MCHC: 32.8 g/dL (ref 32.0–36.0)
MCV: 83.2 fL (ref 80.0–100.0)
MPV: 9.7 fL (ref 7.5–12.5)
PLATELETS: 391 10*3/uL (ref 140–400)
RBC: 4.88 MIL/uL (ref 3.80–5.10)
RDW: 15.1 % — ABNORMAL HIGH (ref 11.0–15.0)
WBC: 9.3 10*3/uL (ref 3.8–10.8)

## 2015-10-09 LAB — IRON AND TIBC
%SAT: 12 % (ref 11–50)
Iron: 40 ug/dL (ref 40–190)
TIBC: 343 ug/dL (ref 250–450)
UIBC: 303 ug/dL (ref 125–400)

## 2015-10-09 LAB — POCT GLYCOSYLATED HEMOGLOBIN (HGB A1C): HEMOGLOBIN A1C: 7.5

## 2015-10-09 LAB — GLUCOSE, POCT (MANUAL RESULT ENTRY): POC GLUCOSE: 105 mg/dL — AB (ref 70–99)

## 2015-10-09 MED ORDER — METFORMIN HCL 850 MG PO TABS: 850.0000 mg | ORAL_TABLET | Freq: Two times a day (BID) | ORAL | Status: DC

## 2015-10-09 NOTE — Progress Notes (Signed)
Subjective:  Patient ID: Melinda [name], female    DOB: May 11, 1966  Age: 50 y.o. MRN: [id number]  CC: Diabetes   HPI Melinda [name] presents for   1. CHRONIC DIABETES  Disease Monitoring  Blood Sugar Ranges: 77-123  Polyuria: no   Visual problems: yes, but improved overall. She has poor near vision    Medication Compliance: yes  Medication Side Effects  Hypoglycemia: no   Preventitive Health Care  Eye Exam: done don  Foot Exam: done today   Diet pattern: low carb   Exercise: minimal    2. R LE DVT: no bleeding. Easy bruising. Compliant with eliquis. Has hx of IDA. Taking oral iron.    Social History  Substance Use Topics  . Smoking status: Former Smoker -- 0.50 packs/day for 40 years    Types: Cigarettes    Quit date: 11/03/2014  . Smokeless tobacco: Never Used  . Alcohol Use: No    Outpatient Prescriptions Prior to Visit  Medication Sig Dispense Refill  . acetaminophen (TYLENOL) 500 MG tablet Take 500-1,000 mg by mouth every 6 (six) hours as needed for moderate pain (patient takes 1-2 depending on pain level).     Marland Kitchen albuterol (PROVENTIL HFA;VENTOLIN HFA) 108 (90 BASE) MCG/ACT inhaler Inhale 2 puffs into the lungs every 6 (six) hours as needed for wheezing or shortness of breath. 1 Inhaler 0  . apixaban (ELIQUIS) 5 MG TABS tablet 10 mg twice daily for one week, followed by 5 mg twice daily 60 tablet 2  . butalbital-acetaminophen-caffeine (FIORICET) 50-325-40 MG tablet Take 1-2 tablets by mouth every 8 (eight) hours as needed for headache. (Patient not taking: Reported on 09/07/2015) 20 tablet 0  . diphenhydrAMINE (BENADRYL) 25 MG tablet Take 50 mg by mouth at bedtime as needed for sleep (sleep).    . docusate sodium (COLACE) 100 MG capsule Take 1 capsule (100 mg total) by mouth 2 (two) times daily. (Patient not taking: Reported on 09/07/2015) 20 capsule 0  . ferrous sulfate 325 (65 FE) MG tablet TAKE ONE (1) TABLET BY MOUTH TWO (2) TIMES DAILY WITH MEAL 60 tablet 2  .  gabapentin (NEURONTIN) 100 MG capsule TAKE 1 CAPSULE BY MOUTH THREE TIMES A DAY 90 capsule 0  . insulin aspart (NOVOLOG) 100 UNIT/ML injection Inject 10 Units into the skin 3 (three) times daily with meals. DX DM2, Code E11.65 (Patient not taking: Reported on 10/06/2015) 1 vial 12  . insulin glargine (LANTUS) 100 UNIT/ML injection Inject 0.25 mLs (25 Units total) into the skin 2 (two) times daily. Dispense insulin pen if approved. Diagnosis E 11.65. (Patient not taking: Reported on 10/06/2015) 10 mL 0  . insulin lispro (HUMALOG) 100 UNIT/ML cartridge Inject into the skin 3 (three) times daily with meals.    . Insulin Syringe-Needle U-100 25G X 1" 1 ML MISC For 4 times a day insulin SQ, 1 month supply. Diagnosis E11.65 30 each 0  . megestrol (MEGACE) 40 MG tablet TAKE ONE (1) TABLET BY MOUTH 3 TIMES DAILY (Patient not taking: Reported on 10/06/2015) 90 tablet 1  . metFORMIN (GLUCOPHAGE) 850 MG tablet Take 1 tablet (850 mg total) by mouth 2 (two) times daily with a meal. 60 tablet 0  . metoprolol (LOPRESSOR) 50 MG tablet Take 1 tablet (50 mg total) by mouth 2 (two) times daily. 60 tablet 0  . metroNIDAZOLE (FLAGYL) 500 MG tablet Take 1 tablet (500 mg total) by mouth 3 (three) times daily. (Patient not taking: Reported on 10/06/2015) 21 tablet 0  .  Multiple Vitamins-Minerals (MULTIVITAMIN & MINERAL PO) Take 1 tablet by mouth daily.    Marland Kitchen oxyCODONE-acetaminophen (ROXICET) 5-325 MG tablet Take 1 tablet by mouth every 4 (four) hours as needed. (Patient not taking: Reported on 09/07/2015) 30 tablet 0  . SUMAtriptan (IMITREX) 50 MG tablet Take 1 tablet (50 mg total) by mouth every 2 (two) hours as needed for migraine or headache. [name] repeat in 2 hours if headache persists or recurs. Do not exceed two doses in 7 day period. 10 tablet 2   No facility-administered medications prior to visit.    ROS Review of Systems  Constitutional: Negative for fever, chills and fatigue.  Eyes: Positive for visual disturbance.    Respiratory: Negative for shortness of breath.   Cardiovascular: Negative for chest pain.  Gastrointestinal: Negative for abdominal pain and blood in stool.  Endocrine: Negative for polydipsia and polyphagia.  Musculoskeletal: Negative for myalgias, back pain and arthralgias.  Skin: Negative for rash.  Allergic/Immunologic: Negative for immunocompromised state.  Hematological: Negative for adenopathy. Bruises/bleeds easily.  Psychiatric/Behavioral: Negative for suicidal ideas and dysphoric mood.    Objective:  BP 117/80 mmHg  Pulse 93  Temp(Src) 98.6 F (37 C) (Oral)  Resp 16  Ht 5\' 10"  (1.778 m)  Wt 290 lb (131.543 kg)  BMI 41.61 kg/m2  SpO2 97%  LMP 08/10/2015  BP/Weight 10/09/2015 [id number]  Systolic BP 123XX123 - 123456  Diastolic BP 80 - 88  Wt. (Lbs) 290 291 287.6  BMI 41.61 41.75 41.27    Physical Exam  Constitutional: She is oriented to person, place, and time. She appears well-developed and well-nourished. No distress.  HENT:  Head: Normocephalic and atraumatic.  Eyes: Conjunctivae and EOM are normal. Pupils are equal, round, and reactive to light.  Cardiovascular: Normal rate, regular rhythm, normal heart sounds and intact distal pulses.   Pulmonary/Chest: Effort normal and breath sounds normal.  Musculoskeletal: She exhibits no edema or tenderness.       Right lower leg: She exhibits no tenderness and no edema.  Neurological: She is alert and oriented to person, place, and time.  Skin: Skin is warm and dry. No rash noted.  Psychiatric: She has a normal mood and affect.    Lab Results  Component Value Date   HGBA1C 7.5 10/09/2015   CBG 105 Assessment & Plan:   Melinda [name] was seen today for diabetes.  Diagnoses and all orders for this visit:  Controlled type 2 diabetes mellitus with complication, with long-term current use of insulin (HCC) -     POCT glycosylated hemoglobin (Hb A1C) -     POCT glucose (manual entry) -     metFORMIN (GLUCOPHAGE) 850 MG  tablet; Take 1 tablet (850 mg total) by mouth 2 (two) times daily with a meal.  Iron deficiency anemia due to chronic blood loss -     CBC -     Ferritin -     Iron and TIBC  Screening for HIV (human immunodeficiency virus) -     HIV antibody (with reflex)    No orders of the defined types were placed in this encounter.    Follow-up: No Follow-up on file.   Boykin Nearing MD

## 2015-10-09 NOTE — Patient Instructions (Addendum)
Melinda [name] was seen today for diabetes.  Diagnoses and all orders for this visit:  Controlled type 2 diabetes mellitus with complication, with long-term current use of insulin (HCC) -     POCT glycosylated hemoglobin (Hb A1C) -     POCT glucose (manual entry) -     metFORMIN (GLUCOPHAGE) 850 MG tablet; Take 1 tablet (850 mg total) by mouth 2 (two) times daily with a meal.  Iron deficiency anemia due to chronic blood loss -     CBC -     Ferritin -     Iron and TIBC  Screening for HIV (human immunodeficiency virus) -     HIV antibody (with reflex)    F/u in 6 weeks for pap smear  Dr. Adrian Blackwater

## 2015-10-09 NOTE — Progress Notes (Signed)
F/U DM  Taking mediation as prescribed  Glucose running 77-123 No tobacco user  No suicidal thoughts in the past two weeks

## 2015-10-10 DIAGNOSIS — Z794 Long term (current) use of insulin: Secondary | ICD-10-CM | POA: Insufficient documentation

## 2015-10-10 DIAGNOSIS — E118 Type 2 diabetes mellitus with unspecified complications: Secondary | ICD-10-CM | POA: Insufficient documentation

## 2015-10-10 DIAGNOSIS — I82401 Acute embolism and thrombosis of unspecified deep veins of right lower extremity: Secondary | ICD-10-CM | POA: Insufficient documentation

## 2015-10-10 NOTE — Assessment & Plan Note (Signed)
Diabetes is now controlled on metformin and humalog V-Go

## 2015-10-10 NOTE — Assessment & Plan Note (Signed)
Normal iron levels s/p endometrial ablation and oral iron Continue oral iron therapy

## 2015-10-10 NOTE — Assessment & Plan Note (Signed)
Hx of PE in 10/2014 and RLE DVT in 08/2105  Plan for eliquis indefinitely

## 2015-10-12 NOTE — Progress Notes (Signed)
Diabetes Self-Management Education  Visit Type: First/Initial  Appt. Start Time: 0830 Appt. End Time: 1000  10/12/2015  [name], identified by name and date of birth, is a 50 y.o. female with a diagnosis of Diabetes: Type 2.   ASSESSMENT  Height 5\' 10"  (1.778 m), weight 291 lb (131.997 kg). Body mass index is 41.75 kg/(m^2).      Diabetes Self-Management Education - 10/06/15 0851    Visit Information   Visit Type First/Initial   Initial Visit   Diabetes Type Type 2   Are you currently following a meal plan? Yes   What type of meal plan do you follow? making healthier choices, more whole grain foods, no snacking   Are you taking your medications as prescribed? Yes   Date Diagnosed Feb, 2017   Health Coping   How would you rate your overall health? 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    What is the last grade level you completed in school? 1st year college   Complications   Last HgB A1C per patient/outside source 11.7 %   How often do you check your blood sugar? 3-4 times/day   Fasting Blood glucose range (mg/dL) 70-129   Postprandial Blood glucose range (mg/dL) 70-129   Number of hypoglycemic episodes per month 0   Have you had a dilated eye exam in the past 12 months? Yes   Have you had a dental exam in the past 12 months? No  teeth are removed.   Are you checking your feet? Yes   How many days per week are you checking your feet? 7   Dietary Intake   Breakfast apple cinnamon oatmeal OR [place]  yogurt OR honey nut cheerios OR eggs, [place] sausage   Snack (morning) no   Lunch sandwich with either mashed or fried sweet potatoes OR left overs    Snack (afternoon) no   Dinner lean meats including red meat due to low iron levels, whole grain starches, vegetables   Snack (evening) occasionally PNB sandwich   Beverage(s) green tea, diluted lemonade mix, lemon water, diluted OJ   Exercise   Exercise Type Light (walking / raking leaves)  stationary bike pedals and mini trampoline, and dances   How many days per week to you exercise? 6   How many minutes per day do you exercise? 30   Total minutes per week of exercise 180   Patient Education   Previous Diabetes Education Yes (please comment)  in hospital and when adult son was diagnosed 67 years ago   Disease state  Definition of diabetes, type 1 and 2, and the diagnosis of diabetes   Nutrition management  Role of diet in the treatment of diabetes and the relationship between the three main macronutrients and blood glucose level;Food label reading, portion sizes and measuring food.;Carbohydrate counting   Physical activity and exercise  Role of exercise on diabetes management, blood pressure control and cardiac health.   Medications Reviewed patients medication for diabetes, action, purpose, timing of dose and side effects.  She is currently using V-Go 30  for insulin administration.   Monitoring Purpose and frequency of SMBG.;Identified appropriate SMBG and/or A1C goals.  patient states she can see her current meter OK and does not need talking meter after all with her new reading glasses   Acute complications Taught treatment of hypoglycemia - the 15 rule.   Chronic complications Relationship between chronic complications and blood glucose control   Psychosocial adjustment Role of stress on diabetes   Individualized Goals (developed by patient)   Nutrition Follow meal plan discussed   Physical Activity 30 minutes per day   Monitoring   test my blood glucose as discussed   Outcomes   Expected Outcomes Demonstrated interest in learning. Expect positive outcomes   Future DMSE PRN   Program Status Completed      Individualized Plan for Diabetes Self-Management Training:   Learning Objective:  Patient will have a greater understanding of diabetes self-management. Patient education plan is to attend individual and/or group sessions per assessed needs and concerns.   Plan:   Patient Instructions  Plan:  Aim for 3 Carb Choices per meal (45 grams) +/- 1 either way  Aim for 0-2 Carbs per snack if hungry  Include protein in moderation with your meals and snacks Continue reading food labels for Total Carbohydrate of foods Continue with your activity level daily as tolerated Continue checking BG at alternate times per day   Continue taking diabetes medications  as directed by MD      Expected Outcomes:  Demonstrated interest in learning. Expect positive outcomes  Education material provided: Living Well with Diabetes, A1C conversion sheet, Meal plan card and Carbohydrate counting sheet  If problems or questions, patient to contact team via:  Phone and Email  Future DSME appointment: PRN

## 2015-10-19 ENCOUNTER — Ambulatory Visit
Admission: RE | Admit: 2015-10-19 | Discharge: 2015-10-19 | Disposition: A | Discharge status: Home / Self Care | Payer: Medicaid Other | Source: Ambulatory Visit | Attending: Family Medicine | Admitting: Family Medicine

## 2015-10-19 DIAGNOSIS — Z1231 Encounter for screening mammogram for malignant neoplasm of breast: Secondary | ICD-10-CM

## 2015-10-31 ENCOUNTER — Ambulatory Visit: Payer: Self-pay | Admitting: Surgery

## 2015-11-01 ENCOUNTER — Telehealth: Payer: Self-pay | Admitting: *Deleted

## 2015-11-01 NOTE — Telephone Encounter (Signed)
Nurse Earlie Server stated pt has a depression screening with elevated results Was taking Zoloft in the past  PHQ9 #[id number]  Earlie Server Will send referral to mental health

## 2015-11-06 ENCOUNTER — Other Ambulatory Visit: Payer: Self-pay | Admitting: Family Medicine

## 2015-11-06 NOTE — Telephone Encounter (Signed)
I have refilled the test strips and sent them to [facility]

## 2015-11-06 NOTE — Telephone Encounter (Signed)
Please refill Accu check test strips please send to [facility], if possible today.

## 2015-11-10 ENCOUNTER — Other Ambulatory Visit: Payer: Self-pay | Admitting: Family Medicine

## 2015-11-10 NOTE — Progress Notes (Signed)
ECHO 11-09-14 EPIC CHEST XRAY 08-18-15 EPIC EKG 08-18-15 EPIC, WILL REPEAT DAY OF PRE OP DUE TO PROLONGED QT INTERVAL

## 2015-11-10 NOTE — Patient Instructions (Signed)
Melinda [name]  11/10/2015   Your procedure is scheduled on: 11-17-15  Report to [facility] Main  Entrance take [facility]  elevators to 3rd floor to  [place] at 730  AM.  Call this number if you have problems the morning of surgery [phone]   Remember: ONLY 1 PERSON MAY GO WITH YOU TO SHORT STAY TO GET  READY MORNING OF [place].  Do not eat food or drink liquids :After Midnight.     Take these medicines the morning of surgery with A SIP OF WATER: ALBUTEROL INHALER IF NEEDED AND BRING INHALER, GABAPENTIN (NEURONTIN), METOPROLOL (LOPRESSOR) DO NOT TAKE ANY DIABETIC MEDICATIONS DAY OF YOUR SURGERY                               You may not have any metal on your body including hair pins and              piercings  Do not wear jewelry, make-up, lotions, powders or perfumes, deodorant             Do not wear nail polish.  Do not shave  48 hours prior to surgery.              Men may shave face and neck.   Do not bring valuables to the hospital. [place].  Contacts, dentures or bridgework may not be worn into surgery.  Leave suitcase in the car. After surgery it may be brought to your room.     Patients discharged the day of surgery will not be allowed to drive home.  Name and phone number of your driver:  Special Instructions: N/A              Please read over the following fact sheets you were given: _____________________________________________________________________             [facility] - Preparing for Surgery Before surgery, you can play an important role.  Because skin is not sterile, your skin needs to be as free of germs as possible.  You can reduce the number of germs on your skin by washing with CHG (chlorahexidine gluconate) soap before surgery.  CHG is an antiseptic cleaner which kills germs and bonds with the skin to continue killing germs even after washing. Please DO NOT use if you  have an allergy to CHG or antibacterial soaps.  If your skin becomes reddened/irritated stop using the CHG and inform your nurse when you arrive at Short Stay. Do not shave (including legs and underarms) for at least 48 hours prior to the first CHG shower.  You may shave your face/neck. Please follow these instructions carefully:  1.  Shower with CHG Soap the night before surgery and the  morning of Surgery.  2.  If you choose to wash your hair, wash your hair first as usual with your  normal  shampoo.  3.  After you shampoo, rinse your hair and body thoroughly to remove the  shampoo.                           4.  Use CHG as you would any other liquid soap.  You can apply  chg directly  to the skin and wash                       Gently with a scrungie or clean washcloth.  5.  Apply the CHG Soap to your body ONLY FROM THE NECK DOWN.   Do not use on face/ open                           Wound or open sores. Avoid contact with eyes, ears mouth and genitals (private parts).                       Wash face,  Genitals (private parts) with your normal soap.             6.  Wash thoroughly, paying special attention to the area where your surgery  will be performed.  7.  Thoroughly rinse your body with warm water from the neck down.  8.  DO NOT shower/wash with your normal soap after using and rinsing off  the CHG Soap.                9.  Pat yourself dry with a clean towel.            10.  Wear clean pajamas.            11.  Place clean sheets on your bed the night of your first shower and do not  sleep with pets. Day of Surgery : Do not apply any lotions/deodorants the morning of surgery.  Please wear clean clothes to the hospital/surgery center.  FAILURE TO FOLLOW THESE INSTRUCTIONS MAY RESULT IN THE CANCELLATION OF YOUR SURGERY PATIENT SIGNATURE_________________________________  NURSE  SIGNATURE__________________________________  ________________________________________________________________________

## 2015-11-14 ENCOUNTER — Ambulatory Visit (HOSPITAL_COMMUNITY)
Admission: RE | Admit: 2015-11-14 | Discharge: 2015-11-14 | Disposition: A | Discharge status: Home / Self Care | Payer: Medicaid Other | Source: Ambulatory Visit | Attending: Anesthesiology | Admitting: Anesthesiology

## 2015-11-14 ENCOUNTER — Encounter (HOSPITAL_COMMUNITY)
Admission: RE | Admit: 2015-11-14 | Discharge: 2015-11-14 | Disposition: A | Discharge status: Home / Self Care | Payer: Medicaid Other | Source: Ambulatory Visit | Attending: Surgery | Admitting: Surgery

## 2015-11-14 ENCOUNTER — Encounter (HOSPITAL_COMMUNITY): Payer: Self-pay

## 2015-11-14 DIAGNOSIS — Z01812 Encounter for preprocedural laboratory examination: Secondary | ICD-10-CM | POA: Diagnosis present

## 2015-11-14 DIAGNOSIS — Z01811 Encounter for preprocedural respiratory examination: Secondary | ICD-10-CM | POA: Diagnosis present

## 2015-11-14 HISTORY — DX: Other incomplete lesion at T7-T10 level of thoracic spinal cord, initial encounter: S24.153A

## 2015-11-14 HISTORY — DX: Unspecified convulsions: R56.9

## 2015-11-14 HISTORY — DX: Essential (primary) hypertension: I10

## 2015-11-14 HISTORY — DX: Unspecified glaucoma: H40.9

## 2015-11-14 HISTORY — DX: Personal history of other medical treatment: Z92.89

## 2015-11-14 HISTORY — DX: Varicella without complication: B01.9

## 2015-11-14 HISTORY — DX: Unspecified pre-eclampsia, unspecified trimester: O14.90

## 2015-11-14 LAB — BASIC METABOLIC PANEL
Anion gap: 7 (ref 5–15)
BUN: 7 mg/dL (ref 6–20)
CALCIUM: 9.2 mg/dL (ref 8.9–10.3)
CO2: 25 mmol/L (ref 22–32)
CREATININE: 0.63 mg/dL (ref 0.44–1.00)
Chloride: 106 mmol/L (ref 101–111)
GFR calc non Af Amer: >60 mL/min (ref >60)
Glucose, Bld: 99 mg/dL (ref 65–99)
Potassium: 4.2 mmol/L (ref 3.5–5.1)
SODIUM: 138 mmol/L (ref 135–145)

## 2015-11-14 LAB — CBC
HCT: 40.9 % (ref 36.0–46.0)
Hemoglobin: 12.9 g/dL (ref 12.0–15.0)
MCH: 27.3 pg (ref 26.0–34.0)
MCHC: 31.5 g/dL (ref 30.0–36.0)
MCV: 86.7 fL (ref 78.0–100.0)
PLATELETS: 394 10*3/uL (ref 150–400)
RBC: 4.72 MIL/uL (ref 3.87–5.11)
RDW: 14.4 % (ref 11.5–15.5)
WBC: 11.6 10*3/uL — AB (ref 4.0–10.5)

## 2015-11-14 LAB — HCG, SERUM, QUALITATIVE: Preg, Serum: NEGATIVE

## 2015-11-14 LAB — PROTIME-INR
INR: 1.04 (ref 0.00–1.49)
PROTHROMBIN TIME: 13.8 s (ref 11.6–15.2)

## 2015-11-15 LAB — HEMOGLOBIN A1C
HEMOGLOBIN A1C: 6.2 % — AB (ref 4.8–5.6)
Mean Plasma Glucose: 131 mg/dL

## 2015-11-16 ENCOUNTER — Encounter (HOSPITAL_COMMUNITY): Payer: Self-pay | Admitting: Surgery

## 2015-11-16 DIAGNOSIS — D440 Neoplasm of uncertain behavior of thyroid gland: Secondary | ICD-10-CM | POA: Diagnosis present

## 2015-11-16 DIAGNOSIS — C73 Malignant neoplasm of thyroid gland: Secondary | ICD-10-CM | POA: Diagnosis present

## 2015-11-16 NOTE — H&P (Signed)
General Surgery [facility]  [name] DOB: 10/05/1965 Single / Language: Cleophus Molt / Race: Black or African American Female  History of Present Illness  The patient is a 50 year old female who presents with a complaint of enlarged thyroid.  Patient is referred by Dr. Delrae Rend for evaluation of multinodular thyroid goiter with cytologic atypia. Patient had been found incidentally on CT scan to have bilateral thyroid nodules. On January 30, 2015 she underwent an ultrasound of the thyroid showing a markedly enlarged thyroid gland containing multiple thyroid nodules. Right lobe measured 9.0 cm. Dominant nodule on the right measured 5.2 cm. Left lobe measured 6.8 cm. Dominant nodule on the left measured 3.2 cm. Fine-needle aspiration biopsy of the dominant nodules was obtained. Right thyroid nodule showed atypia of undetermined significance. Subsequent AFIRMA biopsy was obtained and was suspicious for malignancy, representing a 40% risk of thyroid cancer. Patient has also had some mild compressive symptoms including dysphagia and frequent hoarseness. Patient has never been on thyroid medication. She has had no prior head or neck surgery. There is no family history of thyroid disease and specifically no history of thyroid cancer.  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 She continues to take Eliquis. This is managed by her primary care physician. She has stopped the [facility] was for previous surgical procedures.   Other Problems Anxiety Disorder Arthritis Asthma Atrial Fibrillation Back Pain Chest pain Cholelithiasis Chronic Obstructive Lung Disease Depression Diabetes Mellitus Gastroesophageal Reflux Disease High blood pressure Migraine Headache Pulmonary Embolism / Blood Clot in Legs Thyroid Disease Transfusion history  Past Surgical History Gallbladder Surgery - Open Oral  Surgery  Diagnostic Studies History Colonoscopy never Mammogram within last year Pap Smear 1-5 years ago  Allergies CefTRIAXone Sodium *CHEMICALS* Citrus Calcium *MINERALS & ELECTROLYTES* Penicillin V *PENICILLINS* Shellfish-derived Products  Medication History Albuterol Sulfate HFA (108MCG/ACT Aerosol Soln, Inhalation) Active. Apixaban (5MG  Tablet, Oral) Active. Benadryl Allergy (25MG  Capsule, Oral) Active. Gabapentin (100MG  Capsule, Oral three times daily) Active. HumaLOG KwikPen (100UNIT/ML Soln Pen-inj, Subcutaneous) Active. MetFORMIN HCl (850MG  Tablet, Oral) Active. Metoprolol Tartrate (50MG  Tablet, Oral) Active. SUMAtriptan Succinate (50MG  Tablet, Oral) Active. Medications Reconciled  Social History Alcohol use Remotely quit alcohol use. Caffeine use Tea. No drug use Tobacco use Former smoker.  Family History Alcohol Abuse Father, Son. Depression Son. Diabetes Mellitus Son. Heart Disease Son. Heart disease in female family member before age 48 Heart disease in female family member before age 22 Hypertension Father. Kidney Disease Son. Migraine Headache Son. Respiratory Condition Father.  Pregnancy / Birth History Age at menarche 29 years. Gravida 1 Irregular periods Maternal age <15 Para 1  Review of Systems General Present- Appetite Loss. Not Present- Chills, Fatigue, Fever, Night Sweats, Weight Gain and Weight Loss. Skin Present- Dryness and Non-Healing Wounds. Not Present- Change in Wart/Mole, Hives, Jaundice, New Lesions, Rash and Ulcer. HEENT Present- Earache, Hoarseness, Ringing in the Ears, Seasonal Allergies, Sore Throat, Visual Disturbances and Wears glasses/contact lenses. Not Present- Hearing Loss, Nose Bleed, Oral Ulcers, Sinus Pain and Yellow Eyes. Respiratory Present- Wheezing. Not Present- Bloody sputum, Chronic Cough, Difficulty Breathing and Snoring. Breast Not Present- Breast Mass, Breast Pain, Nipple  Discharge and Skin Changes. Cardiovascular Not Present- Chest Pain, Difficulty Breathing Lying Down, Leg Cramps, Palpitations, Rapid Heart Rate, Shortness of Breath and Swelling of Extremities. Gastrointestinal Present- Abdominal Pain, Change in Bowel Habits, Difficulty Swallowing and Nausea. Not Present- Bloating, Bloody Stool, Chronic diarrhea, Constipation, Excessive gas, Gets  full quickly at meals, Hemorrhoids, Indigestion, Rectal Pain and Vomiting. Female Genitourinary Present- Pelvic Pain. Not Present- Frequency, Nocturia, Painful Urination and Urgency. Musculoskeletal Present- Back Pain, Joint Pain and Joint Stiffness. Not Present- Muscle Pain, Muscle Weakness and Swelling of Extremities. Neurological Present- Headaches, Numbness, Tingling and Trouble walking. Not Present- Decreased Memory, Fainting, Seizures, Tremor and Weakness. Psychiatric Present- Change in Sleep Pattern. Not Present- Anxiety, Bipolar, Depression, Fearful and Frequent crying. Endocrine Present- Heat Intolerance and New Diabetes. Not Present- Cold Intolerance, Excessive Hunger, Hair Changes and Hot flashes. Hematology Present- Easy Bruising and Excessive bleeding. Not Present- Gland problems, HIV and Persistent Infections.  Vitals Weight: 291.4 lb Height: 70in Body Surface Area: 2.45 m Body Mass Index: 41.81 kg/m  Temp.: 98.33F  Pulse: 98 (Regular)  BP: 146/84 (Sitting, Left Arm, Standard)  Physical Exam  General - appears comfortable, no distress; not diaphorectic  HEENT - normocephalic; sclerae clear, gaze conjugate; mucous membranes moist, dentition fair  Neck - asymmetric on extension; no palpable anterior or posterior cervical adenopathy; moderately enlarged thyroid gland, right greater than left, with multiple palpable nodules, nontender; right lobe extends beneath the right clavicle; there is minimal tracheal deviation; voice quality appears normal at conversational level.  Chest - clear  bilaterally without rhonchi, rales, or wheeze  Cor - regular rhythm with normal rate; no significant murmur  Ext - non-tender without significant edema or lymphedema  Neuro - grossly intact; no tremor  Assessment & Plan  MULTIPLE THYROID NODULES (E04.2) NEOPLASM OF UNCERTAIN BEHAVIOR OF THYROID GLAND (D44.0)  Pt Education - Pamphlet Given - The Thyroid Book: discussed with patient and provided information  Patient presents with multinodular thyroid goiter with cytologic atypia. Based on biopsy results, the patient has a risk of malignancy of 40%. Patient is provided with written literature on thyroid surgery to review at home.  After review of diagnostic studies and cytopathology reports, I have recommended proceeding with total thyroidectomy. The patient and I have discussed this in detail. We have discussed the risk and benefits of the procedure including the potential for recurrent laryngeal nerve injury and injury to parathyroid glands. We have discussed the hospital stay to be anticipated. We have discussed the postoperative recovery. We have discussed the need for lifelong thyroid hormone replacement. Patient understands and wishes to proceed with surgery in the near future.  The risks and benefits of the procedure have been discussed at length with the patient. The patient understands the proposed procedure, potential alternative treatments, and the course of recovery to be expected. All of the patient's questions have been answered at this time. The patient wishes to proceed with surgery.  Earnstine Regal, MD, [facility] Office: [phone]

## 2015-11-17 ENCOUNTER — Other Ambulatory Visit: Payer: Medicaid Other | Admitting: Family Medicine

## 2015-11-17 ENCOUNTER — Ambulatory Visit (HOSPITAL_COMMUNITY): Payer: Medicaid Other | Admitting: Anesthesiology

## 2015-11-17 ENCOUNTER — Encounter (HOSPITAL_COMMUNITY)
Admission: RE | Disposition: A | Discharge status: Home / Self Care | Payer: Self-pay | Source: Ambulatory Visit | Attending: Surgery

## 2015-11-17 ENCOUNTER — Encounter (HOSPITAL_COMMUNITY): Payer: Self-pay | Admitting: *Deleted

## 2015-11-17 ENCOUNTER — Observation Stay (HOSPITAL_COMMUNITY)
Admission: RE | Admit: 2015-11-17 | Discharge: 2015-11-18 | Disposition: A | Discharge status: Home / Self Care | Payer: Medicaid Other | Source: Ambulatory Visit | Attending: Surgery | Admitting: Surgery

## 2015-11-17 DIAGNOSIS — Z7901 Long term (current) use of anticoagulants: Secondary | ICD-10-CM | POA: Diagnosis not present

## 2015-11-17 DIAGNOSIS — Z87891 Personal history of nicotine dependence: Secondary | ICD-10-CM | POA: Diagnosis not present

## 2015-11-17 DIAGNOSIS — Z794 Long term (current) use of insulin: Secondary | ICD-10-CM | POA: Insufficient documentation

## 2015-11-17 DIAGNOSIS — I10 Essential (primary) hypertension: Secondary | ICD-10-CM | POA: Diagnosis not present

## 2015-11-17 DIAGNOSIS — E042 Nontoxic multinodular goiter: Secondary | ICD-10-CM | POA: Diagnosis present

## 2015-11-17 DIAGNOSIS — C73 Malignant neoplasm of thyroid gland: Secondary | ICD-10-CM | POA: Diagnosis not present

## 2015-11-17 DIAGNOSIS — D440 Neoplasm of uncertain behavior of thyroid gland: Secondary | ICD-10-CM

## 2015-11-17 DIAGNOSIS — M1990 Unspecified osteoarthritis, unspecified site: Secondary | ICD-10-CM | POA: Insufficient documentation

## 2015-11-17 DIAGNOSIS — I4891 Unspecified atrial fibrillation: Secondary | ICD-10-CM | POA: Diagnosis not present

## 2015-11-17 DIAGNOSIS — G43909 Migraine, unspecified, not intractable, without status migrainosus: Secondary | ICD-10-CM | POA: Insufficient documentation

## 2015-11-17 DIAGNOSIS — Z86711 Personal history of pulmonary embolism: Secondary | ICD-10-CM | POA: Insufficient documentation

## 2015-11-17 DIAGNOSIS — J449 Chronic obstructive pulmonary disease, unspecified: Secondary | ICD-10-CM | POA: Insufficient documentation

## 2015-11-17 DIAGNOSIS — E1151 Type 2 diabetes mellitus with diabetic peripheral angiopathy without gangrene: Secondary | ICD-10-CM | POA: Insufficient documentation

## 2015-11-17 DIAGNOSIS — Z79899 Other long term (current) drug therapy: Secondary | ICD-10-CM | POA: Insufficient documentation

## 2015-11-17 HISTORY — PX: THYROIDECTOMY: SHX17

## 2015-11-17 LAB — GLUCOSE, CAPILLARY
GLUCOSE-CAPILLARY: 108 mg/dL — AB (ref 65–99)
GLUCOSE-CAPILLARY: 94 mg/dL (ref 65–99)
Glucose-Capillary: 144 mg/dL — ABNORMAL HIGH (ref 65–99)
Glucose-Capillary: 156 mg/dL — ABNORMAL HIGH (ref 65–99)
Glucose-Capillary: 158 mg/dL — ABNORMAL HIGH (ref 65–99)

## 2015-11-17 SURGERY — THYROIDECTOMY
Anesthesia: General

## 2015-11-17 MED ORDER — PROPOFOL 10 MG/ML IV BOLUS
INTRAVENOUS | Status: AC
  Filled 2015-11-17: qty 20

## 2015-11-17 MED ORDER — DIPHENHYDRAMINE HCL 12.5 MG/5ML PO ELIX
12.5000 mg | ORAL_SOLUTION | ORAL | Status: DC | PRN
  Administered 2015-11-17: 12.5 mg via ORAL
  Filled 2015-11-17: qty 5

## 2015-11-17 MED ORDER — LIDOCAINE HCL (CARDIAC) 20 MG/ML IV SOLN
INTRAVENOUS | Status: DC | PRN
  Administered 2015-11-17: 100 mg via INTRAVENOUS

## 2015-11-17 MED ORDER — ROCURONIUM BROMIDE 100 MG/10ML IV SOLN
INTRAVENOUS | Status: DC | PRN
  Administered 2015-11-17: 15 mg via INTRAVENOUS
  Administered 2015-11-17: 10 mg via INTRAVENOUS
  Administered 2015-11-17: 15 mg via INTRAVENOUS
  Administered 2015-11-17: 5 mg via INTRAVENOUS
  Administered 2015-11-17: 30 mg via INTRAVENOUS

## 2015-11-17 MED ORDER — ONDANSETRON HCL 4 MG/2ML IJ SOLN
4.0000 mg | Freq: Four times a day (QID) | INTRAMUSCULAR | Status: DC | PRN
  Filled 2015-11-17: qty 2

## 2015-11-17 MED ORDER — PROMETHAZINE HCL 25 MG/ML IJ SOLN
6.2500 mg | INTRAMUSCULAR | Status: DC | PRN
  Administered 2015-11-17: 6.25 mg via INTRAVENOUS
  Filled 2015-11-17: qty 1

## 2015-11-17 MED ORDER — ROCURONIUM BROMIDE 50 MG/5ML IV SOLN
INTRAVENOUS | Status: AC
  Filled 2015-11-17: qty 1

## 2015-11-17 MED ORDER — HYDROMORPHONE HCL 1 MG/ML IJ SOLN
INTRAMUSCULAR | Status: AC
  Administered 2015-11-17: 0.5 mg via INTRAVENOUS
  Filled 2015-11-17: qty 1

## 2015-11-17 MED ORDER — ONDANSETRON HCL 4 MG/2ML IJ SOLN
INTRAMUSCULAR | Status: DC | PRN
  Administered 2015-11-17: 4 mg via INTRAVENOUS

## 2015-11-17 MED ORDER — INSULIN LISPRO 100 UNIT/ML CARTRIDGE: 30.0000 [IU] | Freq: Three times a day (TID) | SUBCUTANEOUS | Status: DC

## 2015-11-17 MED ORDER — ALBUTEROL SULFATE (2.5 MG/3ML) 0.083% IN NEBU: 2.5000 mg | INHALATION_SOLUTION | Freq: Four times a day (QID) | RESPIRATORY_TRACT | Status: DC | PRN

## 2015-11-17 MED ORDER — SUGAMMADEX SODIUM 200 MG/2ML IV SOLN
INTRAVENOUS | Status: DC | PRN
  Administered 2015-11-17: 300 mg via INTRAVENOUS

## 2015-11-17 MED ORDER — CIPROFLOXACIN IN D5W 400 MG/200ML IV SOLN
INTRAVENOUS | Status: AC
  Filled 2015-11-17: qty 200

## 2015-11-17 MED ORDER — SYNTHROID 100 MCG PO TABS: 100.0000 ug | ORAL_TABLET | Freq: Every day | ORAL | Status: DC

## 2015-11-17 MED ORDER — SODIUM CHLORIDE 0.9 % IJ SOLN
INTRAMUSCULAR | Status: AC
  Filled 2015-11-17: qty 10

## 2015-11-17 MED ORDER — DEXAMETHASONE SODIUM PHOSPHATE 10 MG/ML IJ SOLN
INTRAMUSCULAR | Status: AC
  Filled 2015-11-17: qty 2

## 2015-11-17 MED ORDER — DEXAMETHASONE SODIUM PHOSPHATE 10 MG/ML IJ SOLN
INTRAMUSCULAR | Status: DC | PRN
  Administered 2015-11-17: 10 mg via INTRAVENOUS

## 2015-11-17 MED ORDER — POTASSIUM CHLORIDE IN NACL 20-0.45 MEQ/L-% IV SOLN
INTRAVENOUS | Status: DC
  Administered 2015-11-17: 15:00:00 via INTRAVENOUS
  Filled 2015-11-17 (×2): qty 1000

## 2015-11-17 MED ORDER — SUCCINYLCHOLINE CHLORIDE 20 MG/ML IJ SOLN
INTRAMUSCULAR | Status: DC | PRN
  Administered 2015-11-17: 100 mg via INTRAVENOUS

## 2015-11-17 MED ORDER — METFORMIN HCL 850 MG PO TABS
850.0000 mg | ORAL_TABLET | Freq: Two times a day (BID) | ORAL | Status: DC
  Administered 2015-11-17 – 2015-11-18 (×2): 850 mg via ORAL
  Filled 2015-11-17 (×4): qty 1

## 2015-11-17 MED ORDER — SUGAMMADEX SODIUM 500 MG/5ML IV SOLN
INTRAVENOUS | Status: AC
  Filled 2015-11-17: qty 5

## 2015-11-17 MED ORDER — PROMETHAZINE HCL 25 MG/ML IJ SOLN
INTRAMUSCULAR | Status: AC
  Administered 2015-11-17: 6.25 mg via INTRAVENOUS
  Filled 2015-11-17: qty 1

## 2015-11-17 MED ORDER — HYDROMORPHONE HCL 1 MG/ML IJ SOLN
0.2500 mg | INTRAMUSCULAR | Status: DC | PRN
  Administered 2015-11-17 (×4): 0.5 mg via INTRAVENOUS

## 2015-11-17 MED ORDER — PHENYLEPHRINE 40 MCG/ML (10ML) SYRINGE FOR IV PUSH (FOR BLOOD PRESSURE SUPPORT)
PREFILLED_SYRINGE | INTRAVENOUS | Status: AC
  Filled 2015-11-17: qty 10

## 2015-11-17 MED ORDER — INSULIN ASPART 100 UNIT/ML ~~LOC~~ SOLN: 30.0000 [IU] | Freq: Three times a day (TID) | SUBCUTANEOUS | Status: DC

## 2015-11-17 MED ORDER — FENTANYL CITRATE (PF) 100 MCG/2ML IJ SOLN
INTRAMUSCULAR | Status: DC | PRN
  Administered 2015-11-17: 150 ug via INTRAVENOUS
  Administered 2015-11-17 (×2): 50 ug via INTRAVENOUS

## 2015-11-17 MED ORDER — CALCIUM CARBONATE-VITAMIN D 500-200 MG-UNIT PO TABS: 2.0000 | ORAL_TABLET | Freq: Two times a day (BID) | ORAL | Status: DC

## 2015-11-17 MED ORDER — 0.9 % SODIUM CHLORIDE (POUR BTL) OPTIME
TOPICAL | Status: DC | PRN
  Administered 2015-11-17: 1000 mL

## 2015-11-17 MED ORDER — FENTANYL CITRATE (PF) 250 MCG/5ML IJ SOLN
INTRAMUSCULAR | Status: AC
  Filled 2015-11-17: qty 5

## 2015-11-17 MED ORDER — LIDOCAINE HCL (CARDIAC) 20 MG/ML IV SOLN
INTRAVENOUS | Status: AC
  Filled 2015-11-17: qty 5

## 2015-11-17 MED ORDER — LACTATED RINGERS IV SOLN
INTRAVENOUS | Status: DC
  Administered 2015-11-17: 12:00:00 via INTRAVENOUS
  Administered 2015-11-17: 1000 mL via INTRAVENOUS

## 2015-11-17 MED ORDER — HYDROCODONE-ACETAMINOPHEN 5-325 MG PO TABS
1.0000 | ORAL_TABLET | ORAL | Status: DC | PRN
  Administered 2015-11-17 (×2): 1 via ORAL
  Administered 2015-11-18: 2 via ORAL
  Filled 2015-11-17: qty 1
  Filled 2015-11-17: qty 2
  Filled 2015-11-17: qty 1

## 2015-11-17 MED ORDER — EPHEDRINE SULFATE 50 MG/ML IJ SOLN
INTRAMUSCULAR | Status: AC
  Filled 2015-11-17: qty 1

## 2015-11-17 MED ORDER — ONDANSETRON HCL 4 MG/2ML IJ SOLN
INTRAMUSCULAR | Status: AC
  Filled 2015-11-17: qty 2

## 2015-11-17 MED ORDER — METOPROLOL TARTRATE 50 MG PO TABS
50.0000 mg | ORAL_TABLET | Freq: Two times a day (BID) | ORAL | Status: DC
  Administered 2015-11-17 – 2015-11-18 (×2): 50 mg via ORAL
  Filled 2015-11-17 (×2): qty 1

## 2015-11-17 MED ORDER — GABAPENTIN 100 MG PO CAPS
100.0000 mg | ORAL_CAPSULE | Freq: Three times a day (TID) | ORAL | Status: DC
  Administered 2015-11-17 – 2015-11-18 (×3): 100 mg via ORAL
  Filled 2015-11-17 (×3): qty 1

## 2015-11-17 MED ORDER — ACETAMINOPHEN 650 MG RE SUPP: 650.0000 mg | Freq: Four times a day (QID) | RECTAL | Status: DC | PRN

## 2015-11-17 MED ORDER — CALCIUM CARBONATE 1250 (500 CA) MG PO TABS
2.0000 | ORAL_TABLET | Freq: Three times a day (TID) | ORAL | Status: DC
  Administered 2015-11-17 – 2015-11-18 (×2): 1000 mg via ORAL
  Filled 2015-11-17 (×2): qty 1

## 2015-11-17 MED ORDER — PROPOFOL 10 MG/ML IV BOLUS
INTRAVENOUS | Status: DC | PRN
  Administered 2015-11-17: 200 mg via INTRAVENOUS

## 2015-11-17 MED ORDER — MIDAZOLAM HCL 2 MG/2ML IJ SOLN
INTRAMUSCULAR | Status: AC
  Filled 2015-11-17: qty 2

## 2015-11-17 MED ORDER — ACETAMINOPHEN 325 MG PO TABS: 650.0000 mg | ORAL_TABLET | Freq: Four times a day (QID) | ORAL | Status: DC | PRN

## 2015-11-17 MED ORDER — HYDROCODONE-ACETAMINOPHEN 5-325 MG PO TABS: 1.0000 | ORAL_TABLET | ORAL | Status: DC | PRN

## 2015-11-17 MED ORDER — PHENYLEPHRINE HCL 10 MG/ML IJ SOLN
INTRAMUSCULAR | Status: DC | PRN
  Administered 2015-11-17 (×2): 40 ug via INTRAVENOUS
  Administered 2015-11-17: 80 ug via INTRAVENOUS

## 2015-11-17 MED ORDER — CIPROFLOXACIN IN D5W 400 MG/200ML IV SOLN
400.0000 mg | INTRAVENOUS | Status: AC
  Administered 2015-11-17: 400 mg via INTRAVENOUS

## 2015-11-17 MED ORDER — INSULIN ASPART 100 UNIT/ML ~~LOC~~ SOLN
0.0000 [IU] | Freq: Three times a day (TID) | SUBCUTANEOUS | Status: DC
  Administered 2015-11-17: 3 [IU] via SUBCUTANEOUS
  Administered 2015-11-18: 2 [IU] via SUBCUTANEOUS

## 2015-11-17 MED ORDER — HYDROMORPHONE HCL 1 MG/ML IJ SOLN
1.0000 mg | INTRAMUSCULAR | Status: DC | PRN
  Administered 2015-11-17 (×2): 1 mg via INTRAVENOUS
  Filled 2015-11-17 (×2): qty 1

## 2015-11-17 MED ORDER — ONDANSETRON 4 MG PO TBDP: 4.0000 mg | ORAL_TABLET | Freq: Four times a day (QID) | ORAL | Status: DC | PRN

## 2015-11-17 MED ORDER — MIDAZOLAM HCL 2 MG/2ML IJ SOLN
INTRAMUSCULAR | Status: DC | PRN
  Administered 2015-11-17: 2 mg via INTRAVENOUS

## 2015-11-17 SURGICAL SUPPLY — 39 items
ATTRACTOMAT 16X20 MAGNETIC DRP (DRAPES) ×3 IMPLANT
BENZOIN TINCTURE PRP APPL 2/3 (GAUZE/BANDAGES/DRESSINGS) ×3 IMPLANT
BLADE HEX COATED 2.75 (ELECTRODE) ×3 IMPLANT
BLADE SURG 15 STRL LF DISP TIS (BLADE) ×1 IMPLANT
BLADE SURG 15 STRL SS (BLADE) ×2
CHLORAPREP W/TINT 26ML (MISCELLANEOUS) ×3 IMPLANT
CLIP TI MEDIUM 6 (CLIP) ×21 IMPLANT
CLIP TI WIDE RED SMALL 6 (CLIP) ×18 IMPLANT
CLOSURE WOUND 1/2 X4 (GAUZE/BANDAGES/DRESSINGS) ×1
COVER SURGICAL LIGHT HANDLE (MISCELLANEOUS) ×3 IMPLANT
DISSECTOR ROUND CHERRY 3/8 STR (MISCELLANEOUS) IMPLANT
DRAPE LAPAROTOMY T 98X78 PEDS (DRAPES) ×3 IMPLANT
DRESSING SURGICEL FIBRLLR 1X2 (HEMOSTASIS) ×2 IMPLANT
DRSG SURGICEL FIBRILLAR 1X2 (HEMOSTASIS) ×6
ELECT PENCIL ROCKER SW 15FT (MISCELLANEOUS) ×3 IMPLANT
ELECT REM PT RETURN 9FT ADLT (ELECTROSURGICAL) ×3
ELECTRODE REM PT RTRN 9FT ADLT (ELECTROSURGICAL) ×1 IMPLANT
GAUZE SPONGE 4X4 12PLY STRL (GAUZE/BANDAGES/DRESSINGS) ×3 IMPLANT
GAUZE SPONGE 4X4 16PLY XRAY LF (GAUZE/BANDAGES/DRESSINGS) ×3 IMPLANT
GLOVE SURG ORTHO 8.0 STRL STRW (GLOVE) ×3 IMPLANT
GOWN STRL REUS W/TWL XL LVL3 (GOWN DISPOSABLE) ×6 IMPLANT
KIT BASIN OR (CUSTOM PROCEDURE TRAY) ×3 IMPLANT
LIGHT WAVEGUIDE WIDE FLAT (MISCELLANEOUS) ×3 IMPLANT
LIQUID BAND (GAUZE/BANDAGES/DRESSINGS) IMPLANT
NS IRRIG 1000ML POUR BTL (IV SOLUTION) ×3 IMPLANT
PACK BASIC VI WITH GOWN DISP (CUSTOM PROCEDURE TRAY) ×3 IMPLANT
SHEARS HARMONIC 9CM CVD (BLADE) ×3 IMPLANT
STAPLER VISISTAT 35W (STAPLE) IMPLANT
STRIP CLOSURE SKIN 1/2X4 (GAUZE/BANDAGES/DRESSINGS) ×2 IMPLANT
SUT MNCRL AB 4-0 PS2 18 (SUTURE) ×3 IMPLANT
SUT SILK 2 0 (SUTURE)
SUT SILK 2-0 18XBRD TIE 12 (SUTURE) IMPLANT
SUT SILK 3 0 (SUTURE)
SUT SILK 3-0 18XBRD TIE 12 (SUTURE) IMPLANT
SUT VIC AB 3-0 SH 18 (SUTURE) ×6 IMPLANT
SYR BULB IRRIGATION 50ML (SYRINGE) ×3 IMPLANT
TOWEL OR 17X26 10 PK STRL BLUE (TOWEL DISPOSABLE) ×3 IMPLANT
TOWEL OR NON WOVEN STRL DISP B (DISPOSABLE) ×3 IMPLANT
YANKAUER SUCT BULB TIP 10FT TU (MISCELLANEOUS) ×3 IMPLANT

## 2015-11-17 NOTE — Discharge Instructions (Signed)
[facility] ° °THYROID & PARATHYROID SURGERY:  POST-OP INSTRUCTIONS ° °Always review your discharge instruction sheet from the facility where your surgery was performed. ° °A prescription for pain medication may be given to you upon discharge.  Take your pain medication as prescribed.  If narcotic pain medicine is not needed, then you may take acetaminophen (Tylenol) or ibuprofen (Advil) as needed. ° °Take your usually prescribed medications unless otherwise directed. ° °If you need a refill on your pain medication, please contact your pharmacy. They will contact our office to request authorization.  Prescriptions will not be processed by our office after 5 pm or on weekends. ° °Start with a light diet upon arrival home, such as soup and crackers or toast.  Be sure to drink plenty of fluids daily.  Resume your normal diet the day after surgery. ° °Most patients will experience some swelling and bruising on the chest and neck area.  Ice packs will help.  Swelling and bruising can take several days to resolve.  ° °It is common to experience some constipation after surgery.  Increasing fluid intake and taking a stool softener will usually help or prevent this problem.  A mild laxative (Milk of Magnesia or Miralax) should be taken according to package directions if there has been no bowel movement after 48 hours. ° °You have steri-strips and a gauze dressing over your incision.  You may remove the gauze bandage on the second day after surgery, and you may shower at that time.  Leave your steri-strips (small skin tapes) in place directly over the incision.  These strips should remain on the skin for 5-7 days and then be removed.  You may get them wet in the shower and pat them dry. ° °You may resume regular (light) daily activities beginning the next day - such as daily self-care, walking, climbing stairs - gradually increasing activities as tolerated.  You may have sexual intercourse when it is  comfortable.  Refrain from any heavy lifting or straining until approved by your doctor.  You may drive when you no longer are taking prescription pain medication, you can comfortably wear a seatbelt, and you can safely maneuver your car and apply brakes. ° °You should see your doctor in the office for a follow-up appointment approximately two to three weeks after your surgery.  Make sure that you call for this appointment within a day or two after you arrive home to insure a convenient appointment time. ° °WHEN TO CALL YOUR DOCTOR: °-- Fever greater than 101.5 °-- Inability to urinate °-- Nausea and/or vomiting - persistent °-- Extreme swelling or bruising °-- Continued bleeding from incision °-- Increased pain, redness, or drainage from the incision °-- Difficulty swallowing or breathing °-- Muscle cramping or spasms °-- Numbness or tingling in hands or around lips ° °The clinic staff is available to answer your questions during regular business hours.  Please don’t hesitate to call and ask to speak to one of the nurses if you have concerns. ° °Melinda Shippee M. Corian Handley, MD, FACS °General & Endocrine Surgery °[facility] °Office: [phone] ° °Website: [email or web address] ° ° °

## 2015-11-17 NOTE — Op Note (Signed)
Procedure Note  Pre-operative Diagnosis:  Multinodular thyroid goiter, thyroid neoplasm of uncertain behavior  Post-operative Diagnosis:  same  Surgeon:  Earnstine Regal, MD, FACS  Assistant:  Jackolyn Confer, MD, FACS   Procedure:  Total thyroidectomy  Anesthesia:  General  Estimated Blood Loss:  minimal  Drains: none         Specimen: thyroid to pathology  Indications:  The patient is a 50 year old female who presents with a complaint of enlarged thyroid.  Patient is referred by Dr. Delrae Rend for evaluation of multinodular thyroid goiter with cytologic atypia. Patient had been found incidentally on CT scan to have bilateral thyroid nodules. On January 30, 2015 she underwent an ultrasound of the thyroid showing a markedly enlarged thyroid gland containing multiple thyroid nodules. Right lobe measured 9.0 cm. Dominant nodule on the right measured 5.2 cm. Left lobe measured 6.8 cm. Dominant nodule on the left measured 3.2 cm. Fine-needle aspiration biopsy of the dominant nodules was obtained. Right thyroid nodule showed atypia of undetermined significance. Subsequent AFIRMA biopsy was obtained and was suspicious for malignancy, representing a 40% risk of thyroid cancer. Patient has also had some mild compressive symptoms including dysphagia and frequent hoarseness.  Procedure Details: Procedure was done in OR #4 at the [facility].  The patient was brought to the operating room and placed in a supine position on the operating room table.  Following administration of general anesthesia, the patient was positioned and then prepped and draped in the usual aseptic fashion.  After ascertaining that an adequate level of anesthesia had been achieved, a Kocher incision was made with #15 blade.  Dissection was carried through subcutaneous tissues and platysma. Hemostasis was achieved with the electrocautery.  Skin flaps were elevated cephalad and caudad from the thyroid notch to the  sternal notch.  The [place]ner self-retaining retractor was placed for exposure.  Strap muscles were incised in the midline and dissection was begun on the left side.  Strap muscles were reflected laterally.  Left thyroid lobe was enlarged.  The left lobe was gently mobilized with blunt dissection.  Superior pole vessels were dissected out and divided individually between small and medium Ligaclips with the Harmonic scalpel.  The thyroid lobe was rolled anteriorly.  Branches of the inferior thyroid artery were divided between small Ligaclips with the Harmonic scalpel.  Inferior venous tributaries were divided between Ligaclips.  Both the superior and inferior parathyroid glands were identified and preserved on their vascular pedicles.  The recurrent laryngeal nerve was identified and preserved along its course.  The ligament of Gwenlyn Found was released with the electrocautery and the gland was mobilized onto the anterior trachea. Isthmus was mobilized across the midline.  There was no pyramidal lobe present.  Dry pack was placed in the left neck.  Next, the right thyroid lobe was gently mobilized with blunt dissection.  Right thyroid lobe was markedly enlarged.  Superior pole vessels were dissected out and divided between small and medium Ligaclips with the Harmonic scalpel.  Superior parathyroid was identified and preserved.  Inferior venous tributaries were divided between medium Ligaclips with the Harmonic scalpel.  The right thyroid lobe was rolled anteriorly and the branches of the inferior thyroid artery divided between small Ligaclips.  The right recurrent laryngeal nerve was identified and preserved along its course.  The ligament of Gwenlyn Found was released with the electrocautery.  The right thyroid lobe was mobilized onto the anterior trachea and the remainder of the thyroid was dissected off  the anterior trachea and the thyroid was completely excised.  A suture was used to mark the right lobe. The entire thyroid  gland was submitted to pathology for review.  The neck was irrigated with warm saline.  Fibrillar was placed throughout the operative field.  Strap muscles were reapproximated in the midline with interrupted 3-0 Vicryl sutures.  Platysma was closed with interrupted 3-0 Vicryl sutures.  Skin was closed with a running 4-0 Monocryl subcuticular suture.  Wound was washed and dried and benzoin and steri-strips were applied.  Dry gauze dressing was placed.  The patient was awakened from anesthesia and brought to the recovery room.  The patient tolerated the procedure well.   Earnstine Regal, MD, [facility] Office: [phone]

## 2015-11-17 NOTE — Transfer of Care (Signed)
Immediate Anesthesia Transfer of Care Note  Patient: Melinda [name]  Procedure(s) Performed: Procedure(s): TOTAL THYROIDECTOMY (N/A)  Patient Location: PACU  Anesthesia Type:General  Level of Consciousness: awake  Airway & Oxygen Therapy: Patient Spontanous Breathing and Patient connected to face mask oxygen  Post-op Assessment: Report given to RN and Post -op Vital signs reviewed and stable  Post vital signs: Reviewed and stable  Last Vitals:  Filed Vitals:   11/17/15 0821  BP: 147/99  Pulse: 76  Temp: 37.1 C  Resp: 18    Last Pain: There were no vitals filed for this visit.    Patients Stated Pain Goal: 3 ([id number] [phone])  Complications: No apparent anesthesia complications

## 2015-11-17 NOTE — Anesthesia Procedure Notes (Signed)
Procedure Name: Intubation Date/Time: 11/17/2015 10:09 AM Performed by: Danley Danker L Patient Re-evaluated:Patient Re-evaluated prior to inductionOxygen Delivery Method: Circle system utilized Preoxygenation: Pre-oxygenation with 100% oxygen Intubation Type: IV induction Ventilation: Mask ventilation without difficulty and Oral airway inserted - appropriate to patient size Laryngoscope Size: Sabra Heck and 2 Grade View: Grade I Tube type: Reinforced Tube size: 7.5 mm Number of attempts: 1 Airway Equipment and Method: Stylet Placement Confirmation: ETT inserted through vocal cords under direct vision,  positive ETCO2 and breath sounds checked- equal and bilateral Secured at: 21 cm Tube secured with: Tape Dental Injury: Teeth and Oropharynx as per pre-operative assessment

## 2015-11-17 NOTE — Anesthesia Postprocedure Evaluation (Signed)
Anesthesia Post Note  Patient: Melinda [name]  Procedure(s) Performed: Procedure(s) (LRB): TOTAL THYROIDECTOMY (N/A)  Patient location during evaluation: PACU Anesthesia Type: General Level of consciousness: awake Pain management: pain level controlled Vital Signs Assessment: post-procedure vital signs reviewed and stable Respiratory status: spontaneous breathing Cardiovascular status: stable Anesthetic complications: no    Last Vitals:  Filed Vitals:   11/17/15 1349 11/17/15 1406  BP:  157/76  Pulse: 75 81  Temp:  36.9 C  Resp: 14     Last Pain:  Filed Vitals:   11/17/15 1407  PainSc: Asleep                 EDWARDS,[name]

## 2015-11-17 NOTE — Interval H&P Note (Signed)
History and Physical Interval Note:  11/17/2015 9:37 AM  Melinda [name]  has presented today for surgery, with the diagnosis of Multinodular goiter, thyroid neoplasm of uncertain behavior.  The various methods of treatment have been discussed with the patient and family. After consideration of risks, benefits and other options for treatment, the patient has consented to    Procedure(s): TOTAL THYROIDECTOMY (N/A) as a surgical intervention .    The patient's history has been reviewed, patient examined, no change in status, stable for surgery.  I have reviewed the patient's chart and labs.  Questions were answered to the patient's satisfaction.    Earnstine Regal, MD, [facility] Office: [place]

## 2015-11-17 NOTE — Anesthesia Preprocedure Evaluation (Addendum)
Anesthesia Evaluation  Patient identified by MRN, date of birth, ID band Patient awake    Reviewed: Allergy & Precautions, NPO status   Airway Mallampati: II  TM Distance: >3 FB Neck ROM: Full    Dental   Pulmonary asthma , former smoker,    breath sounds clear to auscultation       Cardiovascular hypertension, + Peripheral Vascular Disease   Rhythm:Regular Rate:Normal     Neuro/Psych    GI/Hepatic Neg liver ROS, GERD  ,  Endo/Other  diabetes  Renal/GU negative Renal ROS     Musculoskeletal   Abdominal   Peds  Hematology   Anesthesia Other Findings   Reproductive/Obstetrics                            Anesthesia Physical Anesthesia Plan  ASA: III  Anesthesia Plan: General   Post-op Pain Management:    Induction: Intravenous  Airway Management Planned: Oral ETT  Additional Equipment:   Intra-op Plan:   Post-operative Plan: Extubation in OR  Informed Consent: I have reviewed the patients History and Physical, chart, labs and discussed the procedure including the risks, benefits and alternatives for the proposed anesthesia with the patient or authorized representative who has indicated his/her understanding and acceptance.   Dental advisory given  Plan Discussed with: CRNA and Anesthesiologist  Anesthesia Plan Comments:         Anesthesia Quick Evaluation

## 2015-11-18 DIAGNOSIS — C73 Malignant neoplasm of thyroid gland: Secondary | ICD-10-CM | POA: Diagnosis not present

## 2015-11-18 LAB — BASIC METABOLIC PANEL
ANION GAP: 9 (ref 5–15)
BUN: 6 mg/dL (ref 6–20)
CALCIUM: 8.9 mg/dL (ref 8.9–10.3)
CO2: 24 mmol/L (ref 22–32)
CREATININE: 0.62 mg/dL (ref 0.44–1.00)
Chloride: 104 mmol/L (ref 101–111)
GLUCOSE: 133 mg/dL — AB (ref 65–99)
Potassium: 4.1 mmol/L (ref 3.5–5.1)
Sodium: 137 mmol/L (ref 135–145)

## 2015-11-18 LAB — GLUCOSE, CAPILLARY: Glucose-Capillary: 129 mg/dL — ABNORMAL HIGH (ref 65–99)

## 2015-11-18 NOTE — Progress Notes (Addendum)
CSW received consult for transportation assistance. Patient unable to ride bus home, and no other family/friend available to provide transportation. Patient given cab voucher. Per rn, pt is ready, cab called. Pt to wait downstairs in lobby at main entrance for cab.   Belia Heman, LCSW  Clinical Social Work   [phone]

## 2015-11-18 NOTE — Progress Notes (Signed)
Patient ID: Melinda [name], female   DOB: 04-18-1966, 50 y.o.   MRN: [id number] 1 Day Post-Op  Subjective: No major complaints. Expected soreness. Some mild hoarseness.  Objective: Vital signs in last 24 hours: Temp:  [97.8 F (36.6 C)-99.3 F (37.4 C)] 98.3 F (36.8 C) (05/20 0420) Pulse Rate:  [70-102] 102 (05/20 0420) Resp:  [10-20] 18 (05/20 0420) BP: (137-164)/(76-92) 164/85 mmHg (05/20 0420) SpO2:  [96 %-100 %] 96 % (05/20 0420) Last BM Date: 11/16/15  Intake/Output from previous day: 05/19 0701 - 05/20 0700 In: 1870 [P.O.:240; I.V.:1630] Out: 2375 [Urine:2350; Blood:25] Intake/Output this shift:    General appearance: alert, cooperative and no distress Incision/Wound: no swelling or bleeding  Lab Results:  No results for input(s): WBC, HGB, HCT, PLT in the last 72 hours. BMET  Recent Labs  11/18/15 0433  NA 137  K 4.1  CL 104  CO2 24  GLUCOSE 133*  BUN 6  CREATININE 0.62  CALCIUM 8.9     Studies/Results: No results found.  Anti-infectives: Anti-infectives    Start     Dose/Rate Route Frequency Ordered Stop   11/17/15 0825  ciprofloxacin (CIPRO) IVPB 400 mg     400 mg 200 mL/hr over 60 Minutes Intravenous On call to O.R. 11/17/15 0825 11/17/15 1045      Assessment/Plan: s/p Procedure(s): TOTAL THYROIDECTOMY Doing well postoperatively. Okay for discharge. Instructed to resume  Anticoagulation at home.      Lazer Wollard T 11/18/2015

## 2015-11-20 NOTE — Discharge Summary (Signed)
Physician Discharge Summary [facility]  Patient ID: Melinda [name] MRN: [id number] DOB/AGE: [birth year] 50 y.o.  Admit date: 11/17/2015 Discharge date: 11/18/2015  Admission Diagnoses:  Multinodular goiter with atypia  Discharge Diagnoses:  Principal Problem:   Neoplasm of uncertain behavior of thyroid gland   Discharged Condition: good  Hospital Course: Patient was admitted for observation following thyroid surgery.  Post op course was uncomplicated.  Pain was well controlled.  Tolerated diet.  Post op calcium level on morning following surgery was 8.9 mg/dl.  Patient was prepared for discharge home on POD#1.  Consults: None  Treatments: surgery: total thyroidectomy  Discharge Exam: Blood pressure 131/75, pulse 104, temperature 98.3 F (36.8 C), temperature source Oral, resp. rate 18, height 5\' 10"  (1.778 m), weight 134.265 kg (296 lb), SpO2 96 %. See progress note at time of discharge.  Disposition: Home  Discharge Instructions    Discharge patient    Complete by:  As directed             Medication List    TAKE these medications        ACCU-CHEK AVIVA PLUS test strip  Generic drug:  glucose blood  USE TO TEST BLOOD GLUCOSE FOUR TIMES DAILY BEFORE MEALS AND AT BEDTIME     acetaminophen 500 MG tablet  Commonly known as:  TYLENOL  Take 500-1,000 mg by mouth every 6 (six) hours as needed for moderate pain (patient takes 1-2 depending on pain level).     albuterol 108 (90 Base) MCG/ACT inhaler  Commonly known as:  PROVENTIL HFA;VENTOLIN HFA  Inhale 2 puffs into the lungs every 6 (six) hours as needed for wheezing or shortness of breath.     apixaban 5 MG Tabs tablet  Commonly known as:  ELIQUIS  10 mg twice daily for one week, followed by 5 mg twice daily     calcium-vitamin D 500-200 MG-UNIT tablet  Commonly known as:  OSCAL WITH D  Take 2 tablets by mouth 2 (two) times daily.     diphenhydrAMINE 25 MG tablet  Commonly known as:  BENADRYL   Take 50 mg by mouth at bedtime as needed for sleep.     ferrous sulfate 325 (65 FE) MG tablet  TAKE ONE (1) TABLET BY MOUTH TWO (2) TIMES DAILY WITH MEAL     gabapentin 100 MG capsule  Commonly known as:  NEURONTIN  TAKE 1 CAPSULE BY MOUTH THREE TIMES A DAY     HUMALOG 100 UNIT/ML cartridge  Generic drug:  insulin lispro  Inject 30 Units into the skin 3 (three) times daily with meals.     HYDROcodone-acetaminophen 5-325 MG tablet  Commonly known as:  NORCO/VICODIN  Take 1-2 tablets by mouth every 4 (four) hours as needed for moderate pain.     metFORMIN 850 MG tablet  Commonly known as:  GLUCOPHAGE  Take 1 tablet (850 mg total) by mouth 2 (two) times daily with a meal.     metoprolol 50 MG tablet  Commonly known as:  LOPRESSOR  Take 1 tablet (50 mg total) by mouth 2 (two) times daily.     SUMAtriptan 50 MG tablet  Commonly known as:  IMITREX  Take 1 tablet (50 mg total) by mouth every 2 (two) hours as needed for migraine or headache. May repeat in 2 hours if headache persists or recurs. Do not exceed two doses in 7 day period.     SYNTHROID 100 MCG tablet  Generic drug:  levothyroxine  Take 1 tablet (100 mcg total) by mouth daily.           Follow-up Information    Follow up with Earnstine Regal, MD. Schedule an appointment as soon as possible for a visit in 3 weeks.   Specialty:  General Surgery   Why:  For wound re-check   Contact information:   [place] [address] [phone]       Earnstine Regal, MD, [facility] Office: [phone]   Signed: Earnstine Regal 11/20/2015, 11:41 AM

## 2015-11-21 ENCOUNTER — Encounter (HOSPITAL_COMMUNITY): Payer: Self-pay | Admitting: Surgery

## 2015-12-04 ENCOUNTER — Other Ambulatory Visit: Payer: Self-pay | Admitting: Family Medicine

## 2015-12-18 ENCOUNTER — Other Ambulatory Visit: Payer: Self-pay | Admitting: Family Medicine

## 2015-12-21 ENCOUNTER — Encounter: Payer: Self-pay | Admitting: Family Medicine

## 2015-12-21 ENCOUNTER — Other Ambulatory Visit (HOSPITAL_COMMUNITY)
Admission: RE | Admit: 2015-12-21 | Discharge: 2015-12-21 | Disposition: A | Discharge status: Home / Self Care | Payer: Medicaid Other | Source: Ambulatory Visit | Attending: Family Medicine | Admitting: Family Medicine

## 2015-12-21 ENCOUNTER — Ambulatory Visit: Payer: Medicaid Other | Attending: Family Medicine | Admitting: Family Medicine

## 2015-12-21 VITALS — BP 130/88 | HR 65 | Temp 98.6°F | Resp 16 | Ht 70.0 in | Wt 301.0 lb

## 2015-12-21 DIAGNOSIS — K7689 Other specified diseases of liver: Secondary | ICD-10-CM | POA: Diagnosis not present

## 2015-12-21 DIAGNOSIS — C73 Malignant neoplasm of thyroid gland: Secondary | ICD-10-CM | POA: Insufficient documentation

## 2015-12-21 DIAGNOSIS — Z9049 Acquired absence of other specified parts of digestive tract: Secondary | ICD-10-CM | POA: Insufficient documentation

## 2015-12-21 DIAGNOSIS — Z79899 Other long term (current) drug therapy: Secondary | ICD-10-CM | POA: Insufficient documentation

## 2015-12-21 DIAGNOSIS — Z113 Encounter for screening for infections with a predominantly sexual mode of transmission: Secondary | ICD-10-CM | POA: Insufficient documentation

## 2015-12-21 DIAGNOSIS — Z124 Encounter for screening for malignant neoplasm of cervix: Secondary | ICD-10-CM | POA: Diagnosis not present

## 2015-12-21 DIAGNOSIS — Z794 Long term (current) use of insulin: Secondary | ICD-10-CM | POA: Diagnosis not present

## 2015-12-21 DIAGNOSIS — D259 Leiomyoma of uterus, unspecified: Secondary | ICD-10-CM | POA: Diagnosis not present

## 2015-12-21 DIAGNOSIS — E118 Type 2 diabetes mellitus with unspecified complications: Secondary | ICD-10-CM | POA: Diagnosis not present

## 2015-12-21 DIAGNOSIS — Z1151 Encounter for screening for human papillomavirus (HPV): Secondary | ICD-10-CM | POA: Diagnosis not present

## 2015-12-21 DIAGNOSIS — Z7901 Long term (current) use of anticoagulants: Secondary | ICD-10-CM | POA: Diagnosis not present

## 2015-12-21 DIAGNOSIS — Z9889 Other specified postprocedural states: Secondary | ICD-10-CM | POA: Diagnosis not present

## 2015-12-21 DIAGNOSIS — N920 Excessive and frequent menstruation with regular cycle: Secondary | ICD-10-CM | POA: Insufficient documentation

## 2015-12-21 DIAGNOSIS — Z791 Long term (current) use of non-steroidal anti-inflammatories (NSAID): Secondary | ICD-10-CM | POA: Diagnosis not present

## 2015-12-21 DIAGNOSIS — E890 Postprocedural hypothyroidism: Secondary | ICD-10-CM | POA: Insufficient documentation

## 2015-12-21 DIAGNOSIS — Z7984 Long term (current) use of oral hypoglycemic drugs: Secondary | ICD-10-CM | POA: Insufficient documentation

## 2015-12-21 DIAGNOSIS — K769 Liver disease, unspecified: Secondary | ICD-10-CM

## 2015-12-21 LAB — GLUCOSE, POCT (MANUAL RESULT ENTRY): POC Glucose: 106 mg/dl — AB (ref 70–99)

## 2015-12-21 NOTE — Progress Notes (Signed)
Gyn Exam. Pt stated last pap smear last year with normal results No sexually active. No vaginal discharge  Normal  Messes and heavy flow  Former smoker  Pain scale # 6 low back pain and abdominal pain  No suicidal thoughts in the past two weeks

## 2015-12-21 NOTE — Progress Notes (Signed)
Subjective:  Patient ID: Melinda [name], female    DOB: 04-29-1966  Age: 50 y.o. MRN: [id number]  CC: Gynecologic Exam   HPI Melinda [name] has multiple medical problems including papillary thyroid carcinoma, diabetes, asthma, she presents for pap smear. She has no hx of abnormal paps. She has hx of uterine fibroids and menorrhagia that worsened for anticoagulation for DVT. She was treated with Megace initially. She is s/p [place] on 08/13/2015. Her bleeding improved. She is now bleeding monthly. Bleeding is heavy. She has low back and lower abdomina pains.   Past Surgical History  Procedure Laterality Date  . Cholecystectomy    . Angiogram to leg  08-13-15    RIGHT  . Thyroidectomy N/A 11/17/2015    Procedure: TOTAL THYROIDECTOMY;  Surgeon: Armandina Gemma, MD;  Location: [facility];  Service: General;  Laterality: N/A;  . Uterine artery embolization  08/13/2015     Outpatient Prescriptions Prior to Visit  Medication Sig Dispense Refill  . ACCU-CHEK AVIVA PLUS test strip USE TO TEST BLOOD GLUCOSE FOUR TIMES DAILY BEFORE MEALS AND AT BEDTIME 100 each 12  . acetaminophen (TYLENOL) 500 MG tablet Take 500-1,000 mg by mouth every 6 (six) hours as needed for moderate pain (patient takes 1-2 depending on pain level).     Marland Kitchen apixaban (ELIQUIS) 5 MG TABS tablet 10 mg twice daily for one week, followed by 5 mg twice daily 60 tablet 2  . calcium-vitamin D (OSCAL WITH D) 500-200 MG-UNIT tablet Take 2 tablets by mouth 2 (two) times daily. 60 tablet 1  . diphenhydrAMINE (BENADRYL) 25 MG tablet Take 50 mg by mouth at bedtime as needed for sleep.     . ferrous sulfate 325 (65 FE) MG tablet TAKE ONE (1) TABLET BY MOUTH TWO (2) TIMES DAILY WITH A MEAL 60 tablet 2  . gabapentin (NEURONTIN) 100 MG capsule TAKE 1 CAPSULE BY MOUTH THREE TIMES A DAY 90 capsule 2  . insulin lispro (HUMALOG) 100 UNIT/ML cartridge Inject 30 Units into the skin 3 (three) times daily with meals.     . metFORMIN (GLUCOPHAGE) 850 MG tablet Take 1  tablet (850 mg total) by mouth 2 (two) times daily with a meal. 180 tablet 3  . metoprolol (LOPRESSOR) 50 MG tablet TAKE ONE (1) TABLET BY MOUTH TWO (2) TIMES DAILY 60 tablet 2  . PROAIR HFA 108 (90 Base) MCG/ACT inhaler INHALE 2 PUFFS INTO THE LUNGS EVERY 6 HOURS AS NEEDED FOR WHEEZING ORSHORTNESS OF BREATH 8.5 g 1  . SUMAtriptan (IMITREX) 50 MG tablet Take 1 tablet (50 mg total) by mouth every 2 (two) hours as needed for migraine or headache. [name] repeat in 2 hours if headache persists or recurs. Do not exceed two doses in 7 day period. 10 tablet 2  . SYNTHROID 100 MCG tablet Take 1 tablet (100 mcg total) by mouth daily. 30 tablet 3  . HYDROcodone-acetaminophen (NORCO/VICODIN) 5-325 MG tablet Take 1-2 tablets by mouth every 4 (four) hours as needed for moderate pain. (Patient not taking: Reported on 12/21/2015) 20 tablet 0   No facility-administered medications prior to visit.    ROS Review of Systems  Constitutional: Negative for fever and chills.  Eyes: Negative for visual disturbance.  Respiratory: Negative for shortness of breath.   Cardiovascular: Negative for chest pain.  Gastrointestinal: Positive for abdominal pain. Negative for blood in stool.  Genitourinary: Positive for vaginal bleeding.  Musculoskeletal: Positive for back pain. Negative for arthralgias.  Skin: Negative for rash.  Allergic/Immunologic: Negative  for immunocompromised state.  Hematological: Negative for adenopathy. Does not bruise/bleed easily.  Psychiatric/Behavioral: Negative for suicidal ideas and dysphoric mood.    Objective:  BP 130/88 mmHg  Pulse 65  Temp(Src) 98.6 F (37 C) (Oral)  Resp 16  Ht 5\' 10"  (1.778 m)  Wt 301 lb (136.533 kg)  BMI 43.19 kg/m2  SpO2 98%  LMP 12/21/2015  BP/Weight 12/21/2015 11/18/2015 [id number]  Systolic BP [id number] -  Diastolic BP 88 75 -  Wt. (Lbs) 301 - 296  BMI 43.19 - 42.47   Physical Exam  Constitutional: She is oriented to person, place, and time. She appears  well-developed and well-nourished. No distress.  HENT:  Head: Normocephalic and atraumatic.  Cardiovascular: Intact distal pulses.   Pulmonary/Chest: Effort normal.  Genitourinary: Uterus normal. Pelvic exam was performed with patient prone. There is no rash, tenderness or lesion on the right labia. There is no rash, tenderness or lesion on the left labia. Cervix exhibits no motion tenderness, no discharge and no friability. There is bleeding in the vagina. No vaginal discharge found.  Musculoskeletal: She exhibits no edema.  Lymphadenopathy:       Right: No inguinal adenopathy present.       Left: No inguinal adenopathy present.  Neurological: She is alert and oriented to person, place, and time.  Skin: Skin is warm and dry. No rash noted.  Psychiatric: She has a normal mood and affect.   Lab Results  Component Value Date   HGBA1C 6.2* 11/14/2015   CBG 106    Assessment & Plan:   Melinda [name] was seen today for gynecologic exam.  Diagnoses and all orders for this visit:  Pap smear for cervical cancer screening -     Cytology - PAP  Controlled type 2 diabetes mellitus with complication, with long-term current use of insulin (HCC) -     Glucose (CBG)  Papillary thyroid carcinoma (HCC)  Liver lesion  Other orders -     Cervicovaginal ancillary only -     Cervicovaginal ancillary only    No orders of the defined types were placed in this encounter.    Follow-up: Return in about 2 weeks (around 01/04/2016) for back and abdominal pain .   Boykin Nearing MD

## 2015-12-21 NOTE — Patient Instructions (Addendum)
Diagnoses and all orders for this visit:  Pap smear for cervical cancer screening -     Cytology - PAP  Controlled type 2 diabetes mellitus with complication, with long-term current use of insulin (HCC) -     Glucose (CBG)  Papillary thyroid carcinoma ([place])    You will be called with pap results  F/u in 2-3 weeks to discuss back pain  Dr. Adrian Blackwater

## 2015-12-22 LAB — CERVICOVAGINAL ANCILLARY ONLY
CHLAMYDIA, DNA PROBE: NEGATIVE
Neisseria Gonorrhea: NEGATIVE
WET PREP (BD AFFIRM): NEGATIVE

## 2015-12-22 LAB — CYTOLOGY - PAP

## 2015-12-24 ENCOUNTER — Encounter: Payer: Self-pay | Admitting: Family Medicine

## 2015-12-24 DIAGNOSIS — K769 Liver disease, unspecified: Secondary | ICD-10-CM | POA: Insufficient documentation

## 2015-12-24 NOTE — Assessment & Plan Note (Signed)
Liver lesion noted on CT scan obtained for abdominal pain on 08/18/2015 Plan abdominal exam and likely f/u abdominal MRI

## 2015-12-29 ENCOUNTER — Other Ambulatory Visit (HOSPITAL_COMMUNITY): Payer: Self-pay | Admitting: Internal Medicine

## 2015-12-29 DIAGNOSIS — C73 Malignant neoplasm of thyroid gland: Secondary | ICD-10-CM

## 2016-01-05 ENCOUNTER — Encounter (HOSPITAL_COMMUNITY)
Admission: RE | Admit: 2016-01-05 | Discharge: 2016-01-05 | Disposition: A | Discharge status: Home / Self Care | Payer: Medicaid Other | Source: Ambulatory Visit | Attending: Internal Medicine | Admitting: Internal Medicine

## 2016-01-05 DIAGNOSIS — C73 Malignant neoplasm of thyroid gland: Secondary | ICD-10-CM | POA: Insufficient documentation

## 2016-01-05 LAB — HCG, SERUM, QUALITATIVE: PREG SERUM: NEGATIVE

## 2016-01-05 MED ORDER — SODIUM IODIDE I 131 CAPSULE
49.1000 | Freq: Once | INTRAVENOUS | Status: AC | PRN
  Administered 2016-01-05: 49.1 via ORAL

## 2016-01-15 ENCOUNTER — Encounter (HOSPITAL_COMMUNITY)
Admission: RE | Admit: 2016-01-15 | Discharge: 2016-01-15 | Disposition: A | Discharge status: Home / Self Care | Payer: Medicaid Other | Source: Ambulatory Visit | Attending: Internal Medicine | Admitting: Internal Medicine

## 2016-01-15 DIAGNOSIS — C73 Malignant neoplasm of thyroid gland: Secondary | ICD-10-CM | POA: Diagnosis not present

## 2016-01-24 ENCOUNTER — Other Ambulatory Visit: Payer: Self-pay | Admitting: Family Medicine

## 2016-01-24 NOTE — Telephone Encounter (Signed)
Rx Requests 

## 2016-01-25 ENCOUNTER — Encounter: Payer: Self-pay | Admitting: Family Medicine

## 2016-02-08 ENCOUNTER — Encounter (HOSPITAL_COMMUNITY): Payer: Self-pay

## 2016-02-20 ENCOUNTER — Other Ambulatory Visit (HOSPITAL_COMMUNITY): Payer: Self-pay | Admitting: Interventional Radiology

## 2016-02-20 DIAGNOSIS — D259 Leiomyoma of uterus, unspecified: Secondary | ICD-10-CM

## 2016-03-11 ENCOUNTER — Other Ambulatory Visit: Payer: Self-pay | Admitting: Family Medicine

## 2016-03-11 ENCOUNTER — Telehealth: Payer: Self-pay | Admitting: Family Medicine

## 2016-03-11 DIAGNOSIS — Z794 Long term (current) use of insulin: Principal | ICD-10-CM

## 2016-03-11 DIAGNOSIS — E118 Type 2 diabetes mellitus with unspecified complications: Secondary | ICD-10-CM

## 2016-03-11 MED ORDER — ATORVASTATIN CALCIUM 20 MG PO TABS
20.0000 mg | ORAL_TABLET | Freq: Every day | ORAL | 3 refills | Status: DC
Start: 2016-03-11 — End: 2017-03-06

## 2016-03-11 NOTE — Telephone Encounter (Signed)
Patient called Verified name and DOB  1. Dr. Adrian Blackwater added lipitor to her medication regimen to help reduce the risk of heart disease and stroke since she has diabetes. Her diabetes is well controlled and she should continue the good work of controlling her blood sugar.  2. She needs flu shot  3. She needs to f/u for repeat pap.  Note, [facility] is monitoring her A1c and TSH. Last A1c 4.9

## 2016-03-15 LAB — LAB REPORT - SCANNED

## 2016-03-18 ENCOUNTER — Ambulatory Visit: Payer: Medicaid Other | Attending: Family Medicine | Admitting: Family Medicine

## 2016-03-18 ENCOUNTER — Other Ambulatory Visit (HOSPITAL_COMMUNITY)
Admission: RE | Admit: 2016-03-18 | Discharge: 2016-03-18 | Disposition: A | Discharge status: Home / Self Care | Payer: Medicaid Other | Source: Ambulatory Visit | Attending: Family Medicine | Admitting: Family Medicine

## 2016-03-18 ENCOUNTER — Encounter: Payer: Self-pay | Admitting: Family Medicine

## 2016-03-18 VITALS — BP 132/85 | HR 79 | Temp 98.5°F | Ht 70.0 in | Wt 307.0 lb

## 2016-03-18 DIAGNOSIS — K769 Liver disease, unspecified: Secondary | ICD-10-CM

## 2016-03-18 DIAGNOSIS — Z23 Encounter for immunization: Secondary | ICD-10-CM | POA: Diagnosis not present

## 2016-03-18 DIAGNOSIS — Z87891 Personal history of nicotine dependence: Secondary | ICD-10-CM | POA: Diagnosis not present

## 2016-03-18 DIAGNOSIS — Z79899 Other long term (current) drug therapy: Secondary | ICD-10-CM | POA: Diagnosis not present

## 2016-03-18 DIAGNOSIS — R05 Cough: Secondary | ICD-10-CM | POA: Diagnosis not present

## 2016-03-18 DIAGNOSIS — G8929 Other chronic pain: Secondary | ICD-10-CM | POA: Insufficient documentation

## 2016-03-18 DIAGNOSIS — Z01411 Encounter for gynecological examination (general) (routine) with abnormal findings: Secondary | ICD-10-CM | POA: Insufficient documentation

## 2016-03-18 DIAGNOSIS — Z1151 Encounter for screening for human papillomavirus (HPV): Secondary | ICD-10-CM | POA: Diagnosis not present

## 2016-03-18 DIAGNOSIS — E118 Type 2 diabetes mellitus with unspecified complications: Secondary | ICD-10-CM

## 2016-03-18 DIAGNOSIS — E119 Type 2 diabetes mellitus without complications: Secondary | ICD-10-CM | POA: Insufficient documentation

## 2016-03-18 DIAGNOSIS — Z01419 Encounter for gynecological examination (general) (routine) without abnormal findings: Secondary | ICD-10-CM | POA: Diagnosis present

## 2016-03-18 DIAGNOSIS — Z794 Long term (current) use of insulin: Secondary | ICD-10-CM | POA: Diagnosis not present

## 2016-03-18 DIAGNOSIS — Z7901 Long term (current) use of anticoagulants: Secondary | ICD-10-CM | POA: Diagnosis not present

## 2016-03-18 DIAGNOSIS — K7689 Other specified diseases of liver: Secondary | ICD-10-CM

## 2016-03-18 DIAGNOSIS — Z124 Encounter for screening for malignant neoplasm of cervix: Secondary | ICD-10-CM

## 2016-03-18 DIAGNOSIS — J0110 Acute frontal sinusitis, unspecified: Secondary | ICD-10-CM | POA: Insufficient documentation

## 2016-03-18 DIAGNOSIS — R1084 Generalized abdominal pain: Secondary | ICD-10-CM | POA: Diagnosis not present

## 2016-03-18 LAB — COMPLETE METABOLIC PANEL WITH GFR
ALBUMIN: 4 g/dL (ref 3.6–5.1)
ALK PHOS: 90 U/L (ref 33–115)
ALT: 15 U/L (ref 6–29)
AST: 10 U/L (ref 10–35)
BILIRUBIN TOTAL: 0.3 mg/dL (ref 0.2–1.2)
BUN: 9 mg/dL (ref 7–25)
CALCIUM: 9.2 mg/dL (ref 8.6–10.2)
CO2: 22 mmol/L (ref 20–31)
Chloride: 104 mmol/L (ref 98–110)
Creat: 0.6 mg/dL (ref 0.50–1.10)
Glucose, Bld: 101 mg/dL — ABNORMAL HIGH (ref 65–99)
POTASSIUM: 4.3 mmol/L (ref 3.5–5.3)
Sodium: 138 mmol/L (ref 135–146)
TOTAL PROTEIN: 7 g/dL (ref 6.1–8.1)

## 2016-03-18 LAB — GLUCOSE, POCT (MANUAL RESULT ENTRY): POC Glucose: 113 mg/dl — AB (ref 70–99)

## 2016-03-18 LAB — LIPASE: LIPASE: 28 U/L (ref 7–60)

## 2016-03-18 MED ORDER — DOXYCYCLINE HYCLATE 100 MG PO TABS: 100.0000 mg | ORAL_TABLET | Freq: Two times a day (BID) | ORAL | 0 refills | Status: DC

## 2016-03-18 NOTE — Progress Notes (Signed)
Subjective:  Patient ID: Melinda [name], female    DOB: 02-Jun-1966  Age: 49 y.o. MRN: [id number]  CC: Gynecologic Exam  HPI Melinda [name] has multiple medical problems including papillary thyroid carcinoma, diabetes (followed by Endocrinology), asthma, she presents for pap smear.    1. Pap smear: She has no hx of abnormal paps. She has hx of uterine fibroids and menorrhagia that worsened for anticoagulation for DVT. She was treated with Megace initially. She is s/p [place] on 08/13/2015. Her bleeding improved. She is now bleeding monthly. Bleeding is heavy. She has low back and lower abdomina pains. She presents today for repeat pap as her last pap was unsatisfactory. She is having vaginal bleeding that started today and is light.   2. Cough: cough and congestion for past 2 weeks. Productive cough. Also having sore throat and hoarseness. No fever, chills, CP or SOB. No known sick contacts.  3. Abdominal pains: has hx of chronic abdominal pains. She has hx of symptomatic uterine fibroids s/p [place]  (08/13/2015. She reports that  since starting lipitor 20 mg daily she has pains in L upper quadrant. She is concerned about her pancrease since her son has hx of necrotizing pancreatitis in setting of gallstones and alcohol abuse.  She also takes metformin but has not had a recent dose increase. Pain is L upper quadrant. There is associated nausea. No fever, chills or emesis. No weight loss.   Social History  Substance Use Topics  . Smoking status: Former Smoker    Packs/day: 0.50    Years: 40.00    Types: Cigarettes    Quit date: 11/03/2014  . Smokeless tobacco: Never Used  . Alcohol use No    Past Surgical History:  Procedure Laterality Date  . ANGIOGRAM TO LEG  08-13-15   RIGHT  . CHOLECYSTECTOMY    . THYROIDECTOMY N/A 11/17/2015   Procedure: TOTAL THYROIDECTOMY;  Surgeon: Armandina Gemma, MD;  Location: [facility];  Service: General;  Laterality: N/A;  . UTERINE ARTERY EMBOLIZATION Bilateral 08/13/2015      Outpatient Medications Prior to Visit  Medication Sig Dispense Refill  . ACCU-CHEK AVIVA PLUS test strip USE TO TEST BLOOD GLUCOSE FOUR TIMES DAILY BEFORE MEALS AND AT BEDTIME 100 each 12  . acetaminophen (TYLENOL) 500 MG tablet Take 500-1,000 mg by mouth every 6 (six) hours as needed for moderate pain (patient takes 1-2 depending on pain level).     Marland Kitchen apixaban (ELIQUIS) 5 MG TABS tablet 10 mg twice daily for one week, followed by 5 mg twice daily 60 tablet 2  . atorvastatin (LIPITOR) 20 MG tablet Take 1 tablet (20 mg total) by mouth daily. 90 tablet 3  . calcium-vitamin D (OSCAL WITH D) 500-200 MG-UNIT tablet Take 2 tablets by mouth 2 (two) times daily. 60 tablet 1  . diphenhydrAMINE (BENADRYL) 25 MG tablet Take 50 mg by mouth at bedtime as needed for sleep.     Marland Kitchen ELIQUIS 5 MG TABS tablet TAKE ONE (1) TABLET BY MOUTH TWO (2) TIMES DAILY 60 tablet 2  . ferrous sulfate 325 (65 FE) MG tablet TAKE ONE (1) TABLET BY MOUTH TWO (2) TIMES DAILY WITH A MEAL 60 tablet 2  . gabapentin (NEURONTIN) 100 MG capsule TAKE 1 CAPSULE BY MOUTH THREE TIMES A DAY 90 capsule 2  . insulin lispro (HUMALOG) 100 UNIT/ML injection Inject 8 Units into the skin 3 (three) times daily with meals. 8 U at beginning of meal in V-Go 30, up to 100 units/day    .  levothyroxine (SYNTHROID, LEVOTHROID) 200 MCG tablet Take 200 mcg by mouth daily before breakfast.    . metFORMIN (GLUCOPHAGE) 850 MG tablet Take 1 tablet (850 mg total) by mouth 2 (two) times daily with a meal. 180 tablet 3  . metoprolol (LOPRESSOR) 50 MG tablet TAKE ONE (1) TABLET BY MOUTH TWO (2) TIMES DAILY 60 tablet 2  . PROAIR HFA 108 (90 Base) MCG/ACT inhaler INHALE 2 PUFFS INTO THE LUNGS EVERY 6 HOURS AS NEEDED FOR WHEEZING ORSHORTNESS OF BREATH 8.5 g 1  . SUMAtriptan (IMITREX) 50 MG tablet Take 1 tablet (50 mg total) by mouth every 2 (two) hours as needed for migraine or headache. [name] repeat in 2 hours if headache persists or recurs. Do not exceed two doses in  7 day period. 10 tablet 2   No facility-administered medications prior to visit.     ROS Review of Systems  Constitutional: Negative for chills and fever.  HENT: Positive for congestion and sore throat.   Eyes: Negative for visual disturbance.  Respiratory: Positive for cough. Negative for shortness of breath.   Cardiovascular: Negative for chest pain.  Gastrointestinal: Positive for abdominal pain and nausea. Negative for blood in stool and vomiting.  Genitourinary: Positive for vaginal bleeding.  Musculoskeletal: Positive for arthralgias (low back, hips ) and back pain.  Skin: Negative for rash.  Allergic/Immunologic: Negative for immunocompromised state.  Hematological: Negative for adenopathy. Does not bruise/bleed easily.  Psychiatric/Behavioral: Negative for dysphoric mood and suicidal ideas.    Objective:  BP 132/85 (BP Location: Right Arm, Patient Position: Sitting, Cuff Size: Small)   Pulse 79   Temp 98.5 F (36.9 C) (Oral)   Ht 5\' 10"  (1.778 m)   Wt (!) 307 lb (139.3 kg)   SpO2 97%   BMI 44.05 kg/m   BP/Weight 03/18/2016 12/21/2015 [id number]  Systolic BP [id number]  Diastolic BP 85 88 75  Wt. (Lbs) 307 301 -  BMI 44.05 43.19 -   Physical Exam  Constitutional: She is oriented to person, place, and time. She appears well-developed and well-nourished. No distress.  HENT:  Head: Normocephalic and atraumatic.  Right Ear: Tympanic membrane, external ear and ear canal normal.  Left Ear: Tympanic membrane, external ear and ear canal normal.  Nose: No rhinorrhea. Right sinus exhibits frontal sinus tenderness. Right sinus exhibits no maxillary sinus tenderness. Left sinus exhibits frontal sinus tenderness. Left sinus exhibits no maxillary sinus tenderness.  Mouth/Throat: Uvula is midline and oropharynx is clear and moist.  Neck:    Cardiovascular: Intact distal pulses.   Pulmonary/Chest: Effort normal.  Genitourinary: Uterus normal. Pelvic exam was performed with  patient prone. There is no rash, tenderness or lesion on the right labia. There is no rash, tenderness or lesion on the left labia. Cervix exhibits no motion tenderness, no discharge and no friability. There is bleeding in the vagina. No vaginal discharge found.  Musculoskeletal: She exhibits no edema.  Lymphadenopathy:       Right: No inguinal adenopathy present.       Left: No inguinal adenopathy present.  Neurological: She is alert and oriented to person, place, and time.  Skin: Skin is warm and dry. No rash noted.  Psychiatric: She has a normal mood and affect.   Lab Results  Component Value Date   HGBA1C 6.2 (H) 11/14/2015   CBG 106    Assessment & Plan:   Melinda [name] was seen today for gynecologic exam.  Diagnoses and all orders for this visit:  Pap smear  for cervical cancer screening -     Cytology - PAP  Controlled type 2 diabetes mellitus with complication, with long-term current use of insulin (HCC) -     POCT glucose (manual entry)  Generalized abdominal pain -     COMPLETE METABOLIC PANEL WITH GFR -     Lipase  Acute frontal sinusitis, recurrence not specified -     doxycycline (VIBRA-TABS) 100 MG tablet; Take 1 tablet (100 mg total) by mouth 2 (two) times daily.  Encounter for immunization -     Flu Vaccine QUAD 36+ mos IM  Other orders -     Cancel: POCT glycosylated hemoglobin (Hb A1C)    No orders of the defined types were placed in this encounter.   Follow-up: Return in about 3 months (around 06/17/2016) for HTN and DM2 .   Boykin Nearing MD

## 2016-03-18 NOTE — Assessment & Plan Note (Addendum)
Abdominal pains with non focal pelvic and abdominal exam. Plan: Lipase CMP Advised patient decrease lipitor to 10 mg daily for 2 weeks, then back to 20 mg daily if she feels her abdominal pain is due to statin  F/u liver MRI for liver lesion noted on CT abdomen 08/2015

## 2016-03-18 NOTE — Progress Notes (Signed)
Pt is getting flu shot today. 

## 2016-03-18 NOTE — Patient Instructions (Addendum)
Melinda [name] was seen today for gynecologic exam.  Diagnoses and all orders for this visit:  Pap smear for cervical cancer screening -     Cytology - PAP  Controlled type 2 diabetes mellitus with complication, with long-term current use of insulin (HCC) -     POCT glucose (manual entry)  Generalized abdominal pain -     COMPLETE METABOLIC PANEL WITH GFR -     Lipase  Acute frontal sinusitis, recurrence not specified -     doxycycline (VIBRA-TABS) 100 MG tablet; Take 1 tablet (100 mg total) by mouth 2 (two) times daily.  Other orders -     Cancel: POCT glycosylated hemoglobin (Hb A1C)   Due to risk of azithromycin resistant strep pneumonia, I have prescribed doxycycline for your sinus infection.   You may reduce lipitor to 10 mg daily for next two weeks, and slowly increase back to the 20 mg dose for abdominal pains. You will be called with lab results.  F/u in 3 months sooner if needed for diabetes and HTN   Dr. Adrian Blackwater

## 2016-03-18 NOTE — Assessment & Plan Note (Signed)
Cough  With sinus pain x 2 weeks, suspect acute sinusitis Will treat with doxycycline

## 2016-03-19 LAB — CYTOLOGY - PAP

## 2016-04-03 ENCOUNTER — Emergency Department (HOSPITAL_COMMUNITY): Payer: Medicaid Other

## 2016-04-03 ENCOUNTER — Emergency Department (HOSPITAL_COMMUNITY)
Admission: EM | Admit: 2016-04-03 | Discharge: 2016-04-03 | Disposition: A | Discharge status: Home / Self Care | Payer: Medicaid Other | Attending: Emergency Medicine | Admitting: Emergency Medicine

## 2016-04-03 ENCOUNTER — Encounter (HOSPITAL_COMMUNITY): Payer: Self-pay | Admitting: Emergency Medicine

## 2016-04-03 ENCOUNTER — Encounter (HOSPITAL_COMMUNITY): Payer: Self-pay

## 2016-04-03 ENCOUNTER — Other Ambulatory Visit: Payer: Self-pay

## 2016-04-03 ENCOUNTER — Ambulatory Visit (HOSPITAL_COMMUNITY)
Admission: RE | Admit: 2016-04-03 | Discharge: 2016-04-03 | Disposition: A | Discharge status: Home / Self Care | Payer: Medicaid Other | Source: Ambulatory Visit | Attending: Family Medicine | Admitting: Family Medicine

## 2016-04-03 DIAGNOSIS — K769 Liver disease, unspecified: Secondary | ICD-10-CM

## 2016-04-03 DIAGNOSIS — R05 Cough: Secondary | ICD-10-CM | POA: Insufficient documentation

## 2016-04-03 DIAGNOSIS — Z87891 Personal history of nicotine dependence: Secondary | ICD-10-CM | POA: Diagnosis not present

## 2016-04-03 DIAGNOSIS — Z794 Long term (current) use of insulin: Secondary | ICD-10-CM | POA: Insufficient documentation

## 2016-04-03 DIAGNOSIS — I10 Essential (primary) hypertension: Secondary | ICD-10-CM | POA: Insufficient documentation

## 2016-04-03 DIAGNOSIS — Z7901 Long term (current) use of anticoagulants: Secondary | ICD-10-CM | POA: Insufficient documentation

## 2016-04-03 DIAGNOSIS — Z7984 Long term (current) use of oral hypoglycemic drugs: Secondary | ICD-10-CM | POA: Diagnosis not present

## 2016-04-03 DIAGNOSIS — E1140 Type 2 diabetes mellitus with diabetic neuropathy, unspecified: Secondary | ICD-10-CM | POA: Insufficient documentation

## 2016-04-03 DIAGNOSIS — R0789 Other chest pain: Secondary | ICD-10-CM | POA: Diagnosis present

## 2016-04-03 DIAGNOSIS — F909 Attention-deficit hyperactivity disorder, unspecified type: Secondary | ICD-10-CM | POA: Diagnosis not present

## 2016-04-03 DIAGNOSIS — R058 Other specified cough: Secondary | ICD-10-CM

## 2016-04-03 DIAGNOSIS — R062 Wheezing: Secondary | ICD-10-CM | POA: Diagnosis not present

## 2016-04-03 LAB — BASIC METABOLIC PANEL
ANION GAP: 9 (ref 5–15)
BUN: 7 mg/dL (ref 6–20)
CO2: 26 mmol/L (ref 22–32)
Calcium: 9 mg/dL (ref 8.9–10.3)
Chloride: 105 mmol/L (ref 101–111)
Creatinine, Ser: 0.75 mg/dL (ref 0.44–1.00)
GFR calc Af Amer: >60 mL/min (ref >60)
GFR calc non Af Amer: >60 mL/min (ref >60)
GLUCOSE: 92 mg/dL (ref 65–99)
POTASSIUM: 4.2 mmol/L (ref 3.5–5.1)
Sodium: 140 mmol/L (ref 135–145)

## 2016-04-03 LAB — CBC
HEMATOCRIT: 43.6 % (ref 36.0–46.0)
HEMOGLOBIN: 13.7 g/dL (ref 12.0–15.0)
MCH: 29 pg (ref 26.0–34.0)
MCHC: 31.4 g/dL (ref 30.0–36.0)
MCV: 92.4 fL (ref 78.0–100.0)
Platelets: 313 10*3/uL (ref 150–400)
RBC: 4.72 MIL/uL (ref 3.87–5.11)
RDW: 13.2 % (ref 11.5–15.5)
WBC: 10.2 10*3/uL (ref 4.0–10.5)

## 2016-04-03 LAB — I-STAT TROPONIN, ED
Troponin i, poc: 0 ng/mL (ref 0.00–0.08)
Troponin i, poc: 0 ng/mL (ref 0.00–0.08)

## 2016-04-03 LAB — BRAIN NATRIURETIC PEPTIDE: B NATRIURETIC PEPTIDE 5: 19.8 pg/mL (ref 0.0–100.0)

## 2016-04-03 MED ORDER — DEXAMETHASONE SODIUM PHOSPHATE 10 MG/ML IJ SOLN
10.0000 mg | Freq: Once | INTRAMUSCULAR | Status: AC
  Administered 2016-04-03: 10 mg via INTRAVENOUS
  Filled 2016-04-03: qty 1

## 2016-04-03 MED ORDER — CYCLOBENZAPRINE HCL 10 MG PO TABS: 10.0000 mg | ORAL_TABLET | Freq: Two times a day (BID) | ORAL | 0 refills | Status: DC | PRN

## 2016-04-03 MED ORDER — IOPAMIDOL (ISOVUE-370) INJECTION 76%
INTRAVENOUS | Status: AC
  Administered 2016-04-03: 100 mL
  Filled 2016-04-03: qty 100

## 2016-04-03 MED ORDER — KETOROLAC TROMETHAMINE 30 MG/ML IJ SOLN
15.0000 mg | Freq: Once | INTRAMUSCULAR | Status: AC
  Administered 2016-04-03: 15 mg via INTRAVENOUS
  Filled 2016-04-03: qty 1

## 2016-04-03 MED ORDER — HYDROCODONE-HOMATROPINE 5-1.5 MG/5ML PO SYRP: 5.0000 mL | ORAL_SOLUTION | Freq: Four times a day (QID) | ORAL | 0 refills | Status: DC | PRN

## 2016-04-03 MED ORDER — ALBUTEROL SULFATE HFA 108 (90 BASE) MCG/ACT IN AERS
2.0000 | INHALATION_SPRAY | Freq: Once | RESPIRATORY_TRACT | Status: AC
  Administered 2016-04-03: 2 via RESPIRATORY_TRACT
  Filled 2016-04-03: qty 6.7

## 2016-04-03 NOTE — ED Triage Notes (Signed)
Pt sent to ED from MRI-- c/o chest pain-- hurts to breath-- recently completed course of doxycycline for URI -- pt was getting MRI on abd for liver lesion.  Had total thyroidectomy in May, radiation in July--

## 2016-04-03 NOTE — ED Provider Notes (Signed)
[facility] DEPT Provider Note   CSN: [id number] Arrival date & time: 04/03/16  1138     History   Chief Complaint Chief Complaint  Patient presents with  . Chest Pain    HPI Melinda [name] is a 50 y.o. female.  HPI 50 yo female with past medical history of hypertension, diabetes, thyroid CA (not on chemo or radiation at this time), who presents with chest pain. The patient states that for the last week she has had progressively worsening dry cough. She was prescribed doxycycline for this which she has completed with minimal resolution in her cough. She presented to MRI earlier today for MR of her abdomen for unknown liver lesions when she began to complain of sharp, pleuritic chest pain. Patient also has a history of DVT and PE so she was sent to the ED for evaluation. She states her pain is sharp and worse with coughing as well as deep inspiration. Denies any hemoptysis. Denies any shortness of breath. She states the pain feels different from that she experienced when she had her PE. No fevers or chills no sputum production no lower extremity edema no abdominal pain nausea or vomiting.  Past Medical History:  Diagnosis Date  . ADHD (attention deficit hyperactivity disorder)   . Arthritis   . Asthma   . Chicken pox AGE 83  . Diabetes mellitus without complication ([facility])   . GERD (gastroesophageal reflux disease)   . Glaucoma    BOTH EYES, NO EYE DROPS  . History of blood transfusion MARCH 2016    2UNITS GIVEN AND IRON GIVEN  . Hyperglycemia 09/09/2014  . Hypertension   . 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 Iron deficiency anemia due to chronic blood loss 09/09/2014  . Low TSH level 09/08/2014  . Lumbar herniated disc   . Menorrhagia 09/08/2014  . Migraine    CLUSTER AND MIGRAINES  . Multiple thyroid nodules   . Ovarian mass, right 09/09/2014  . PE (pulmonary embolism) 2016  . Peripheral neuropathy ([facility])     FINGER TIPS AND TOES NUMB SOME  . Preeclampsia  1983   WITH PREGNANCY  . PTSD (post-traumatic stress disorder)   . Scoliosis   . Seizures ([place]) AGE 31   NONE SINCE, HAD CHICKEN POX THEN    Patient Active Problem List   Diagnosis Date Noted  . Generalized abdominal pain 03/18/2016  . Acute frontal sinusitis 03/18/2016  . Liver lesion 12/24/2015  . Papillary thyroid carcinoma ([place]) 12/21/2015  . Controlled type 2 diabetes mellitus with complication, with long-term current use of insulin ([place]) 10/10/2015  . Right leg DVT ([place]) 10/10/2015  . Vision loss 08/25/2015  . Decreased vision in both eyes 08/25/2015  . Prolonged Q-T interval on ECG 08/19/2015  . Endometritis 08/18/2015  . Abnormal uterine bleeding (AUB)   . Leiomyoma of uterus   . History of pulmonary embolism 08/10/2015  . Multinodular goiter 04/13/2015  . Asthma 04/13/2015  . Fibroid, uterine   . Morbid obesity ([place]) 11/11/2014  . Chest pain 11/08/2014  . Sinus tachycardia 11/08/2014  . Migraine 10/14/2014  . Iron deficiency anemia due to chronic blood loss 09/09/2014  . Menorrhagia 09/08/2014  . Low TSH level 09/08/2014    Past Surgical History:  Procedure Laterality Date  . ANGIOGRAM TO LEG  08-13-15   RIGHT  . CHOLECYSTECTOMY    . THYROIDECTOMY N/A 11/17/2015   Procedure: TOTAL  THYROIDECTOMY;  Surgeon: Armandina Gemma, MD;  Location: [facility] ORS;  Service: General;  Laterality: N/A;  . UTERINE ARTERY EMBOLIZATION Bilateral 08/13/2015    OB History    Gravida Para Term Preterm AB Living   1 1   1   1    SAB TAB Ectopic Multiple Live Births                   Home Medications    Prior to Admission medications   Medication Sig Start Date End Date Taking? Authorizing Provider  ACCU-CHEK AVIVA PLUS test strip USE TO TEST BLOOD GLUCOSE FOUR TIMES DAILY BEFORE MEALS AND AT BEDTIME 11/06/15   Josalyn Funches, MD  acetaminophen (TYLENOL) 500 MG tablet Take 500-1,000 mg by mouth every 6 (six) hours as needed for  moderate pain (patient takes 1-2 depending on pain level).     Historical Provider, MD  apixaban (ELIQUIS) 5 MG TABS tablet 10 mg twice daily for one week, followed by 5 mg twice daily 08/25/15   Boykin Nearing, MD  atorvastatin (LIPITOR) 20 MG tablet Take 1 tablet (20 mg total) by mouth daily. 03/11/16   Josalyn Funches, MD  calcium-vitamin D (OSCAL WITH D) 500-200 MG-UNIT tablet Take 2 tablets by mouth 2 (two) times daily. 11/17/15   Armandina Gemma, MD  cyclobenzaprine (FLEXERIL) 10 MG tablet Take 1 tablet (10 mg total) by mouth 2 (two) times daily as needed for muscle spasms. 04/03/16   Duffy Bruce, MD  diphenhydrAMINE (BENADRYL) 25 MG tablet Take 50 mg by mouth at bedtime as needed for sleep.     Historical Provider, MD  doxycycline (VIBRA-TABS) 100 MG tablet Take 1 tablet (100 mg total) by mouth 2 (two) times daily. 03/18/16   Josalyn Funches, MD  ELIQUIS 5 MG TABS tablet TAKE ONE (1) TABLET BY MOUTH TWO (2) TIMES DAILY 03/11/16   Josalyn Funches, MD  ferrous sulfate 325 (65 FE) MG tablet TAKE ONE (1) TABLET BY MOUTH TWO (2) TIMES DAILY WITH A MEAL 03/11/16   Josalyn Funches, MD  gabapentin (NEURONTIN) 100 MG capsule TAKE 1 CAPSULE BY MOUTH THREE TIMES A DAY 01/25/16   Josalyn Funches, MD  HYDROcodone-homatropine (HYCODAN) 5-1.5 MG/5ML syrup Take 5 mLs by mouth every 6 (six) hours as needed for cough. 04/03/16   Duffy Bruce, MD  insulin lispro (HUMALOG) 100 UNIT/ML injection Inject 8 Units into the skin 3 (three) times daily with meals. 8 U at beginning of meal in V-Go 30, up to 100 units/day    Historical Provider, MD  levothyroxine (SYNTHROID, LEVOTHROID) 200 MCG tablet Take 200 mcg by mouth daily before breakfast.    Historical Provider, MD  metFORMIN (GLUCOPHAGE) 850 MG tablet Take 1 tablet (850 mg total) by mouth 2 (two) times daily with a meal. 10/09/15   Josalyn Funches, MD  metoprolol (LOPRESSOR) 50 MG tablet TAKE ONE (1) TABLET BY MOUTH TWO (2) TIMES DAILY 12/04/15   Josalyn Funches, MD  PROAIR  HFA 108 (90 Base) MCG/ACT inhaler INHALE 2 PUFFS INTO THE LUNGS EVERY 6 HOURS AS NEEDED FOR WHEEZING ORSHORTNESS OF BREATH 12/18/15   Josalyn Funches, MD  SUMAtriptan (IMITREX) 50 MG tablet Take 1 tablet (50 mg total) by mouth every 2 (two) hours as needed for migraine or headache. May repeat in 2 hours if headache persists or recurs. Do not exceed two doses in 7 day period. 03/15/15   Boykin Nearing, MD    Family History Family History  Problem Relation Age of Onset  . Diabetes  Mother   . Breast cancer Mother   . CAD Mother   . Hypertension Mother   . Alcohol abuse Father   . Hypertension Father     Social History Social History  Substance Use Topics  . Smoking status: Former Smoker    Packs/day: 0.50    Years: 40.00    Types: Cigarettes    Quit date: 11/03/2014  . Smokeless tobacco: Never Used  . Alcohol use No     Allergies   Ceftriaxone; Penicillins; Shellfish allergy; Gold-containing drug products; Nickel; and Citrus   Review of Systems Review of Systems  Constitutional: Positive for fatigue. Negative for chills and fever.  HENT: Negative for congestion, rhinorrhea and sore throat.   Eyes: Negative for visual disturbance.  Respiratory: Positive for cough, chest tightness and shortness of breath. Negative for wheezing.   Cardiovascular: Negative for chest pain and leg swelling.  Gastrointestinal: Negative for abdominal pain, diarrhea, nausea and vomiting.  Genitourinary: Negative for dysuria, flank pain, vaginal bleeding and vaginal discharge.  Musculoskeletal: Negative for neck pain.  Skin: Negative for rash.  Allergic/Immunologic: Negative for immunocompromised state.  Neurological: Negative for syncope and headaches.  Hematological: Does not bruise/bleed easily.  All other systems reviewed and are negative.    Physical Exam Updated Vital Signs BP 110/75 (BP Location: Left Wrist)   Pulse 72   Temp 97.7 F (36.5 C) (Oral)   Resp 13   Ht 5\' 10"  (1.778 m)    Wt (!) 307 lb (139.3 kg)   LMP 03/18/2016   SpO2 98%   BMI 44.05 kg/m   Physical Exam  Constitutional: She is oriented to person, place, and time. She appears well-developed and well-nourished. No distress.  HENT:  Head: Normocephalic and atraumatic.  Eyes: Conjunctivae are normal. Pupils are equal, round, and reactive to light.  Neck: Normal range of motion. Neck supple.  Cardiovascular: Normal rate, regular rhythm and normal heart sounds.  Exam reveals no friction rub.   No murmur heard. Pulmonary/Chest: Effort normal. No respiratory distress. She has wheezes (Faint, expiratory wheezes diffusely). She has no rales. She exhibits tenderness (Mild tenderness to palpation over anterior chest wall bilaterally).  Abdominal: Soft. Bowel sounds are normal. She exhibits no distension. There is no tenderness. There is no rebound and no guarding.  Musculoskeletal: She exhibits no edema.  Neurological: She is alert and oriented to person, place, and time. She exhibits normal muscle tone.  Skin: Skin is warm. Capillary refill takes less than 2 seconds.  Psychiatric: She has a normal mood and affect.  Nursing note and vitals reviewed.    ED Treatments / Results  Labs (all labs ordered are listed, but only abnormal results are displayed) Labs Reviewed  [place], ED  I-STAT [place], ED    EKG  EKG Interpretation  Date/Time:  Wednesday April 03 2016 11:45:35 EDT Ventricular Rate:  83 PR Interval:  158 QRS Duration: 72 QT Interval:  394 QTC Calculation: 462 R Axis:   40 Text Interpretation:  Normal sinus rhythm Normal ECG No significant change since last tracing Confirmed by Yvana Samonte MD, Shelma Eiben [phone]) on 04/03/2016 2:52:54 PM       Radiology Dg Chest 2 View  Result Date: 04/03/2016 CLINICAL DATA:  CHEST PAIN,RECENT URI,COUGH.HX HTN,DM,ASTHMA EXAM: CHEST  2 VIEW COMPARISON:  11/14/2015 FINDINGS: Surgical changes at  the thoracic inlet of thyroidectomy. Midline trachea. Borderline cardiomegaly. Atherosclerosis in the transverse aorta. No pleural effusion or pneumothorax.  right lower lobe scarring. Clear lungs. IMPRESSION: Borderline cardiomegaly, without acute disease. Significantly age advanced aortic atherosclerosis. Electronically Signed   By: Abigail Miyamoto M.D.   On: 04/03/2016 13:45   Ct Angio Chest Pe W And/or Wo Contrast  Result Date: 04/03/2016 CLINICAL DATA:  Cough, shortness of breath, chest pain. History of pulmonary emboli. EXAM: CT ANGIOGRAPHY CHEST WITH CONTRAST TECHNIQUE: Multidetector CT imaging of the chest was performed using the standard protocol during bolus administration of intravenous contrast. Multiplanar CT image reconstructions and MIPs were obtained to evaluate the vascular anatomy. CONTRAST:  100 mL Isovue 370 IV COMPARISON:  08/12/2015 and previous FINDINGS: Cardiovascular: Right arm IV contrast administration. SVC patent. Mild reflux of contrast from the right atrium into the IVC. The right ventricle is nondilated. Only fair contrast opacification of pulmonary artery branches. No evident central filling defects. Limited evaluation of lower lobe subsegmental branches. Patent bilateral pulmonary veins drain into the left atrium. Adequate contrast opacification of the thoracic aorta with no evidence of dissection, aneurysm, or stenosis. There is classic 3-vessel brachiocephalic arch anatomy without proximal stenosis. Mediastinum/Nodes: No pericardial effusion. No adenopathy localized. Lungs/Pleura: Subpleural bleb in the lingula, stable. No new infiltrate or significant nodule. No pleural effusion. No pneumothorax. Upper Abdomen: Coarse calcifications and macroscopic fat in a 5.2 cm right adrenal lesion, present on prior study . No acute findings. Musculoskeletal: Obesity. Anterior vertebral endplate spurring at multiple levels in the mid and lower thoracic spine. Review of the MIP images confirms  the above findings. IMPRESSION: 1. No suspicious findings to suggest acute PE or thoracic aortic dissection. 2. 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 Electronically Signed   By: Lucrezia Europe M.D.   On: 04/03/2016 16:16    Procedures Procedures (including critical care time)  Medications Ordered in ED Medications  iopamidol (ISOVUE-370) 76 % injection (100 mLs  Contrast Given 04/03/16 1547)  ketorolac (TORADOL) 30 MG/ML injection 15 mg (15 mg Intravenous Given 04/03/16 1640)  dexamethasone (DECADRON) injection 10 mg (10 mg Intravenous Given 04/03/16 1642)  albuterol (PROVENTIL HFA;VENTOLIN HFA) 108 (90 Base) MCG/ACT inhaler 2 puff (2 puffs Inhalation Given 04/03/16 1643)     Initial Impression / Assessment and Plan / ED Course  I have reviewed the triage vital signs and the nursing notes.  Pertinent labs & imaging results that were available during my care of the patient were reviewed by me and considered in my medical decision making (see chart for details).  Clinical Course    50 year old female with past medical history of mild asthma, hypertension, thyroid cancer status post total thyroidectomy who presents with positional, mildly pleuritic chest pain. Patient also has history of DVT and PE but has been compliant with her Eliquis. On arrival, vital signs are stable and within normal limits work of breathing is normal with no tachycardia tachypnea or hypoxia. Primary suspicion is musculoskeletal chest wall pain due to frequent coughing in setting of recent URI. However, patient has extensive history and must consider PE and will obtain CTA for further assessment. This will also evaluate for pneumonia or occult lung process. Patient is in agreement with this plan. Regarding her chest pain however, she has a normal EKG, initial negative troponin, and I do not suspect ACS as the pain is highly atypical. Will plan for delta troponin.  Delta troponin is negative. CTA PE is negative. Pain is  improved with Toradol. Discussed management with the patient. I believe she has a component of chest wall pain as well as likely mild  asthma component given wheezing on repeat exam. Will give breathing treatment and single dose of Decadron. She does have diabetes but I discussed the need to closely monitor her sugars. She is in agreement with this plan. Otherwise, do not suspect PE, ACS, or other acute emergency. Will discharge home.  Final Clinical Impressions(s) / ED Diagnoses   Final diagnoses:  Post-viral cough syndrome  Atypical chest pain  Wheezing    New Prescriptions Discharge Medication List as of 04/03/2016  4:28 PM    START taking these medications   Details  cyclobenzaprine (FLEXERIL) 10 MG tablet Take 1 tablet (10 mg total) by mouth 2 (two) times daily as needed for muscle spasms., Starting Wed 04/03/2016, Print    HYDROcodone-homatropine (HYCODAN) 5-1.5 MG/5ML syrup Take 5 mLs by mouth every 6 (six) hours as needed for cough., Starting Wed 04/03/2016, Print         Duffy Bruce, MD 04/03/16 1714

## 2016-04-04 ENCOUNTER — Ambulatory Visit
Admission: RE | Admit: 2016-04-04 | Discharge: 2016-04-04 | Disposition: A | Discharge status: Home / Self Care | Payer: Medicaid Other | Source: Ambulatory Visit | Attending: Interventional Radiology | Admitting: Interventional Radiology

## 2016-04-04 DIAGNOSIS — D259 Leiomyoma of uterus, unspecified: Secondary | ICD-10-CM

## 2016-04-04 HISTORY — PX: IR GENERIC HISTORICAL: IMG1180011

## 2016-04-04 NOTE — Progress Notes (Signed)
Patient ID: Melinda [name], female   DOB: 28-[name]-1967, 50 y.o.   MRN: [id number]         Chief Complaint: Post uterine fibroid embolization  Referring Physician(s): Ruthann Cancer / Kennon Rounds  History of Present Illness: Melinda [name] is a 50 y.o. female with extensive past medical history significant for pulmonary embolism, diabetes, asthma, hypertension and recent history of thyroid cancer who underwent a technically successful bilateral uterine artery embolization on 08/23/2015 for symptomatic uterine fibroids. The uterine artery embolization was performed while the patient was admitted to the hospital with diabetic ketoacidosis however the patient was unable to be discharged secondary to persistent and excessive menstrual bleeding secondary to anticoagulation in the setting of pulmonary embolism.  Since that time, the patient's cycle has remained regular though the length of her cycle has decreased, currently lasting only 7 days (previously, 10 days). Additionally, her cycles are associated with decreased menorrhagia though she still admits she occasionally passes clots.  The patient states she still experiences pelvic pain at the time of her cycle though states this also is improved since undergoing the embolization. Overall, the patient is pleased with the outcome of the procedure.  Patient admits to hot flashes though otherwise denies perimenopausal symptoms.  Past Medical History:  Diagnosis Date  . ADHD (attention deficit hyperactivity disorder)   . Arthritis   . Asthma   . Chicken pox AGE 13  . Diabetes mellitus without complication ([place])   . GERD (gastroesophageal reflux disease)   . Glaucoma    BOTH EYES, NO EYE DROPS  . History of blood transfusion MARCH 2016    2UNITS GIVEN AND IRON GIVEN  . Hyperglycemia 09/09/2014  . Hypertension   . 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 Iron deficiency anemia due to chronic blood  loss 09/09/2014  . Low TSH level 09/08/2014  . Lumbar herniated disc   . Menorrhagia 09/08/2014  . Migraine    CLUSTER AND MIGRAINES  . Multiple thyroid nodules   . Ovarian mass, right 09/09/2014  . PE (pulmonary embolism) 2016  . Peripheral neuropathy (HCC)    FINGER TIPS AND TOES NUMB SOME  . Preeclampsia  1983   WITH PREGNANCY  . PTSD (post-traumatic stress disorder)   . Scoliosis   . Seizures ([place]) AGE 52   NONE SINCE, HAD CHICKEN POX THEN    Past Surgical History:  Procedure Laterality Date  . ANGIOGRAM TO LEG  08-13-15   RIGHT  . CHOLECYSTECTOMY    . THYROIDECTOMY N/A 11/17/2015   Procedure: TOTAL THYROIDECTOMY;  Surgeon: Armandina Gemma, MD;  Location: [facility];  Service: General;  Laterality: N/A;  . UTERINE ARTERY EMBOLIZATION Bilateral 08/13/2015    Allergies: Ceftriaxone; Penicillins; Shellfish allergy; Gold-containing drug products; Nickel; and Citrus  Medications: Prior to Admission medications   Medication Sig Start Date End Date Taking? Authorizing Provider  ACCU-CHEK AVIVA PLUS test strip USE TO TEST BLOOD GLUCOSE FOUR TIMES DAILY BEFORE MEALS AND AT BEDTIME 11/06/15  Yes Josalyn Funches, MD  acetaminophen (TYLENOL) 500 MG tablet Take 500-1,000 mg by mouth every 6 (six) hours as needed for moderate pain (patient takes 1-2 depending on pain level).    Yes Historical Provider, MD  apixaban (ELIQUIS) 5 MG TABS tablet 10 mg twice daily for one week, followed by 5 mg twice daily 08/25/15  Yes Josalyn Funches, MD  atorvastatin (LIPITOR) 20 MG tablet Take 1 tablet (20 mg total)  by mouth daily. 03/11/16  Yes Josalyn Funches, MD  cyclobenzaprine (FLEXERIL) 10 MG tablet Take 1 tablet (10 mg total) by mouth 2 (two) times daily as needed for muscle spasms. 04/03/16  Yes Duffy Bruce, MD  diphenhydrAMINE (BENADRYL) 25 MG tablet Take 50 mg by mouth at bedtime as needed for sleep.    Yes Historical Provider, MD  doxycycline (VIBRA-TABS) 100 MG tablet Take 1 tablet (100 mg total) by mouth 2  (two) times daily. 03/18/16  Yes Josalyn Funches, MD  ELIQUIS 5 MG TABS tablet TAKE ONE (1) TABLET BY MOUTH TWO (2) TIMES DAILY 03/11/16  Yes Josalyn Funches, MD  ferrous sulfate 325 (65 FE) MG tablet TAKE ONE (1) TABLET BY MOUTH TWO (2) TIMES DAILY WITH A MEAL 03/11/16  Yes Josalyn Funches, MD  gabapentin (NEURONTIN) 100 MG capsule TAKE 1 CAPSULE BY MOUTH THREE TIMES A DAY 01/25/16  Yes Josalyn Funches, MD  HYDROcodone-homatropine (HYCODAN) 5-1.5 MG/5ML syrup Take 5 mLs by mouth every 6 (six) hours as needed for cough. 04/03/16  Yes Duffy Bruce, MD  insulin lispro (HUMALOG) 100 UNIT/ML injection Inject 8 Units into the skin 3 (three) times daily with meals. 8 U at beginning of meal in V-Go 30, up to 100 units/day   Yes Historical Provider, MD  levothyroxine (SYNTHROID, LEVOTHROID) 200 MCG tablet Take 200 mcg by mouth daily before breakfast.   Yes Historical Provider, MD  metFORMIN (GLUCOPHAGE) 850 MG tablet Take 1 tablet (850 mg total) by mouth 2 (two) times daily with a meal. 10/09/15  Yes Josalyn Funches, MD  metoprolol (LOPRESSOR) 50 MG tablet TAKE ONE (1) TABLET BY MOUTH TWO (2) TIMES DAILY 12/04/15  Yes Josalyn Funches, MD  PROAIR HFA 108 (90 Base) MCG/ACT inhaler INHALE 2 PUFFS INTO THE LUNGS EVERY 6 HOURS AS NEEDED FOR WHEEZING ORSHORTNESS OF BREATH 12/18/15  Yes Josalyn Funches, MD  SUMAtriptan (IMITREX) 50 MG tablet Take 1 tablet (50 mg total) by mouth every 2 (two) hours as needed for migraine or headache. [name] repeat in 2 hours if headache persists or recurs. Do not exceed two doses in 7 day period. 03/15/15  Yes Josalyn Funches, MD  calcium-vitamin D (OSCAL WITH D) 500-200 MG-UNIT tablet Take 2 tablets by mouth 2 (two) times daily. Patient not taking: Reported on 04/04/2016 11/17/15   Armandina Gemma, MD     Family History  Problem Relation Age of Onset  . Diabetes Mother   . Breast cancer Mother   . CAD Mother   . Hypertension Mother   . Alcohol abuse Father   . Hypertension Father      Social History   Social History  . Marital status: Single    Spouse name: N/A  . Number of children: N/A  . Years of education: N/A   Social History Main Topics  . Smoking status: Former Smoker    Packs/day: 0.50    Years: 40.00    Types: Cigarettes    Quit date: 11/03/2014  . Smokeless tobacco: Never Used  . Alcohol use No  . Drug use: No  . Sexual activity: No   Other Topics Concern  . Not on file   Social History Narrative  . No narrative on file    ECOG Status: 1 - Symptomatic but completely ambulatory  Review of Systems: A 12 point ROS discussed and pertinent positives are indicated in the HPI above.  All other systems are negative.  Review of Systems  Vital Signs: BP 133/69 (BP Location: Left Arm, Patient  Position: Sitting, Cuff Size: Large)   Pulse 82   Temp 98.2 F (36.8 C) (Oral)   Resp 16   LMP 03/18/2016 (Exact Date)   SpO2 100%   Physical Exam  Mallampati Score:     Imaging: Dg Chest 2 View  Result Date: 04/03/2016 CLINICAL DATA:  CHEST PAIN,RECENT URI,COUGH.HX HTN,DM,ASTHMA EXAM: CHEST  2 VIEW COMPARISON:  11/14/2015 FINDINGS: Surgical changes at the thoracic inlet of thyroidectomy. Midline trachea. Borderline cardiomegaly. Atherosclerosis in the transverse aorta. No pleural effusion or pneumothorax. right lower lobe scarring. Clear lungs. IMPRESSION: Borderline cardiomegaly, without acute disease. Significantly age advanced aortic atherosclerosis. Electronically Signed   By: Abigail Miyamoto M.D.   On: 04/03/2016 13:45   Ct Angio Chest Pe W And/or Wo Contrast  Result Date: 04/03/2016 CLINICAL DATA:  Cough, shortness of breath, chest pain. History of pulmonary emboli. EXAM: CT ANGIOGRAPHY CHEST WITH CONTRAST TECHNIQUE: Multidetector CT imaging of the chest was performed using the standard protocol during bolus administration of intravenous contrast. Multiplanar CT image reconstructions and MIPs were obtained to evaluate the vascular anatomy.  CONTRAST:  100 mL Isovue 370 IV COMPARISON:  08/12/2015 and previous FINDINGS: Cardiovascular: Right arm IV contrast administration. SVC patent. Mild reflux of contrast from the right atrium into the IVC. The right ventricle is nondilated. Only fair contrast opacification of pulmonary artery branches. No evident central filling defects. Limited evaluation of lower lobe subsegmental branches. Patent bilateral pulmonary veins drain into the left atrium. Adequate contrast opacification of the thoracic aorta with no evidence of dissection, aneurysm, or stenosis. There is classic 3-vessel brachiocephalic arch anatomy without proximal stenosis. Mediastinum/Nodes: No pericardial effusion. No adenopathy localized. Lungs/Pleura: Subpleural bleb in the lingula, stable. No new infiltrate or significant nodule. No pleural effusion. No pneumothorax. Upper Abdomen: Coarse calcifications and macroscopic fat in a 5.2 cm right adrenal lesion, present on prior study . No acute findings. Musculoskeletal: Obesity. Anterior vertebral endplate spurring at multiple levels in the mid and lower thoracic spine. Review of the MIP images confirms the above findings. IMPRESSION: 1. No suspicious findings to suggest acute PE or thoracic aortic dissection. 2. 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 Electronically Signed   By: Lucrezia Europe M.D.   On: 04/03/2016 16:16    Labs:  CBC:  Recent Labs  08/19/15 0501 10/09/15 1301 11/14/15 1430 04/03/16 1153  WBC 10.2 9.3 11.6* 10.2  HGB 9.2* 13.3 12.9 13.7  HCT 28.8* 40.6 40.9 43.6  PLT 347 391 394 313    COAGS:  Recent Labs  08/13/15 0357 08/19/15 0147 11/14/15 1430  INR 1.27 1.43 1.04  APTT  --  29  --     BMP:  Recent Labs  11/14/15 1430 11/18/15 0433 03/18/16 0953 04/03/16 1153  NA 138 137 138 140  K 4.2 4.1 4.3 4.2  CL 106 104 104 105  CO2 25 24 22 26   GLUCOSE 99 133* 101* 92  BUN 7 6 9 7   CALCIUM 9.2 8.9 9.2 9.0  CREATININE 0.63 0.62 0.60 0.75   GFRNONAA >60 >60 >89 >60  GFRAA >60 >60 >89 >60    LIVER FUNCTION TESTS:  Recent Labs  08/11/15 0352 08/18/15 1842 03/18/16 0953  BILITOT 0.9 0.9 0.3  AST 13* 10* 10  ALT 16 11* 15  ALKPHOS 92 71 90  PROT 6.0* 5.5* 7.0  ALBUMIN 3.2* 2.3* 4.0   Assessment and Plan:  Melinda [name] is a 50 y.o. female with extensive past medical history significant for pulmonary embolism, diabetes, asthma, hypertension  and recent history of thyroid cancer who underwent a technically successful bilateral uterine artery embolization on 08/23/2015 for symptomatic uterine fibroids.   The patient states that since undergoing the uterine artery embolization, her cycles have been associated with decreased menorrhagia and dysmenorrhea and she is overall pleased with the procedure.  Given patient's subjective improvement, I do not feel post procedural imaging is warranted.  I explained to the patient that I hope the procedure will have a durable effect until the onset of her menopause but that she is encouraged to call the interventional radiology clinic with any future questions or concerns.  A copy of this report was sent to the requesting provider on this date.  Electronically Signed: [name] 04/04/2016, 2:25 PM   I spent a total of 10 Minutes in face to face in clinical consultation, greater than 50% of which was counseling/coordinating care for Post uterine fibroid embolization

## 2016-04-22 ENCOUNTER — Other Ambulatory Visit: Payer: Self-pay | Admitting: Pharmacist

## 2016-04-22 ENCOUNTER — Ambulatory Visit: Payer: Medicaid Other | Attending: Family Medicine | Admitting: Family Medicine

## 2016-04-22 ENCOUNTER — Encounter: Payer: Self-pay | Admitting: Family Medicine

## 2016-04-22 VITALS — BP 129/87 | HR 86 | Temp 98.8°F | Ht 70.0 in | Wt 308.6 lb

## 2016-04-22 DIAGNOSIS — J4550 Severe persistent asthma, uncomplicated: Secondary | ICD-10-CM

## 2016-04-22 DIAGNOSIS — Z794 Long term (current) use of insulin: Secondary | ICD-10-CM | POA: Insufficient documentation

## 2016-04-22 DIAGNOSIS — G8929 Other chronic pain: Secondary | ICD-10-CM | POA: Diagnosis not present

## 2016-04-22 DIAGNOSIS — Z7901 Long term (current) use of anticoagulants: Secondary | ICD-10-CM | POA: Diagnosis not present

## 2016-04-22 DIAGNOSIS — Z79899 Other long term (current) drug therapy: Secondary | ICD-10-CM | POA: Diagnosis not present

## 2016-04-22 DIAGNOSIS — K000 Anodontia: Secondary | ICD-10-CM | POA: Diagnosis not present

## 2016-04-22 DIAGNOSIS — Z87891 Personal history of nicotine dependence: Secondary | ICD-10-CM | POA: Insufficient documentation

## 2016-04-22 DIAGNOSIS — K1379 Other lesions of oral mucosa: Secondary | ICD-10-CM | POA: Insufficient documentation

## 2016-04-22 DIAGNOSIS — M545 Low back pain, unspecified: Secondary | ICD-10-CM

## 2016-04-22 DIAGNOSIS — Z86718 Personal history of other venous thrombosis and embolism: Secondary | ICD-10-CM | POA: Insufficient documentation

## 2016-04-22 DIAGNOSIS — L309 Dermatitis, unspecified: Secondary | ICD-10-CM | POA: Diagnosis not present

## 2016-04-22 DIAGNOSIS — E890 Postprocedural hypothyroidism: Secondary | ICD-10-CM | POA: Diagnosis not present

## 2016-04-22 DIAGNOSIS — E118 Type 2 diabetes mellitus with unspecified complications: Secondary | ICD-10-CM

## 2016-04-22 DIAGNOSIS — Z86711 Personal history of pulmonary embolism: Secondary | ICD-10-CM | POA: Diagnosis not present

## 2016-04-22 DIAGNOSIS — K068 Other specified disorders of gingiva and edentulous alveolar ridge: Secondary | ICD-10-CM

## 2016-04-22 DIAGNOSIS — C73 Malignant neoplasm of thyroid gland: Secondary | ICD-10-CM | POA: Diagnosis not present

## 2016-04-22 DIAGNOSIS — K08109 Complete loss of teeth, unspecified cause, unspecified class: Secondary | ICD-10-CM

## 2016-04-22 LAB — GLUCOSE, POCT (MANUAL RESULT ENTRY): POC GLUCOSE: 99 mg/dL (ref 70–99)

## 2016-04-22 LAB — POCT GLYCOSYLATED HEMOGLOBIN (HGB A1C): HEMOGLOBIN A1C: 5.5

## 2016-04-22 MED ORDER — METHOCARBAMOL 500 MG PO TABS: 500.0000 mg | ORAL_TABLET | Freq: Four times a day (QID) | ORAL | 2 refills | Status: DC

## 2016-04-22 MED ORDER — HYDROCORTISONE VALERATE 0.2 % EX OINT: 1.0000 "application " | TOPICAL_OINTMENT | Freq: Two times a day (BID) | CUTANEOUS | 0 refills | Status: DC

## 2016-04-22 MED ORDER — CETIRIZINE HCL 10 MG PO CAPS: 10.0000 mg | ORAL_CAPSULE | Freq: Every day | ORAL | 5 refills | Status: DC

## 2016-04-22 MED ORDER — ALBUTEROL SULFATE (2.5 MG/3ML) 0.083% IN NEBU: 2.5000 mg | INHALATION_SOLUTION | Freq: Four times a day (QID) | RESPIRATORY_TRACT | 1 refills | Status: DC | PRN

## 2016-04-22 MED ORDER — TRAMADOL HCL 50 MG PO TABS: 50.0000 mg | ORAL_TABLET | Freq: Three times a day (TID) | ORAL | 0 refills | Status: DC | PRN

## 2016-04-22 MED ORDER — FLUTICASONE PROPIONATE 50 MCG/ACT NA SUSP: 2.0000 | Freq: Every day | NASAL | 6 refills | Status: DC

## 2016-04-22 MED ORDER — FLUTICASONE-SALMETEROL 100-50 MCG/DOSE IN AEPB: 1.0000 | INHALATION_SPRAY | Freq: Two times a day (BID) | RESPIRATORY_TRACT | 3 refills | Status: DC

## 2016-04-22 NOTE — Telephone Encounter (Signed)
Completed prior authorization for Advair and it was approved.  Hydrocortisone valerate request is pending further review. If needed immediately, will need to switch to plain hydrocortisone cream or ointment.

## 2016-04-22 NOTE — Progress Notes (Signed)
Subjective:  Patient ID: Melinda [name], female    DOB: 1965/08/24  Age: 50 y.o. MRN: [id number]  CC: Follow-up and Cough   HPI Melinda [name] has multiple medical problems including papillary thyroid carcinoma (s/p total thyroidectomy), diabetes (followed by Endocrinology), asthma,  PE and DVT on eliquis she presents for   1. ED f/u cough : she was seen in on 04/03/2016 for chest pain. She had negative troponin and CTA done to evaluate for acute PE was also negative. She had some wheezing on exam and received a breathing treatment and decadron. She was discharged with hycodan syrup and flexeril. No fever or chills. Intermittent chest pain and L anterior shoulder pain when she reaches back with her left arm.   Today, she reports congestion has improved. Still has cough that improved a short time following decadron. Cough is dry. She is uses albuterol inhaler daily.  2. Dermatitis: noted dryness and flaking on eyelids. Also some dryness on legs. No skin discoloration.  3. Back pain: this is chronic but worsening.She reports pain and stiffness when changing from standing to sitting. Her pain is interfering with her activity level. She has tried flexeril and tramadol in the past when she lived in [place].   Social History  Substance Use Topics  . Smoking status: Former Smoker    Packs/day: 0.50    Years: 40.00    Types: Cigarettes    Quit date: 11/03/2014  . Smokeless tobacco: Never Used  . Alcohol use No    Outpatient Medications Prior to Visit  Medication Sig Dispense Refill  . ACCU-CHEK AVIVA PLUS test strip USE TO TEST BLOOD GLUCOSE FOUR TIMES DAILY BEFORE MEALS AND AT BEDTIME 100 each 12  . acetaminophen (TYLENOL) 500 MG tablet Take 500-1,000 mg by mouth every 6 (six) hours as needed for moderate pain (patient takes 1-2 depending on pain level).     Marland Kitchen apixaban (ELIQUIS) 5 MG TABS tablet 10 mg twice daily for one week, followed by 5 mg twice daily 60 tablet 2  . atorvastatin (LIPITOR)  20 MG tablet Take 1 tablet (20 mg total) by mouth daily. 90 tablet 3  . calcium-vitamin D (OSCAL WITH D) 500-200 MG-UNIT tablet Take 2 tablets by mouth 2 (two) times daily. (Patient not taking: Reported on 04/04/2016) 60 tablet 1  . cyclobenzaprine (FLEXERIL) 10 MG tablet Take 1 tablet (10 mg total) by mouth 2 (two) times daily as needed for muscle spasms. 20 tablet 0  . diphenhydrAMINE (BENADRYL) 25 MG tablet Take 50 mg by mouth at bedtime as needed for sleep.     Marland Kitchen doxycycline (VIBRA-TABS) 100 MG tablet Take 1 tablet (100 mg total) by mouth 2 (two) times daily. 20 tablet 0  . ELIQUIS 5 MG TABS tablet TAKE ONE (1) TABLET BY MOUTH TWO (2) TIMES DAILY 60 tablet 2  . ferrous sulfate 325 (65 FE) MG tablet TAKE ONE (1) TABLET BY MOUTH TWO (2) TIMES DAILY WITH A MEAL 60 tablet 2  . gabapentin (NEURONTIN) 100 MG capsule TAKE 1 CAPSULE BY MOUTH THREE TIMES A DAY 90 capsule 2  . HYDROcodone-homatropine (HYCODAN) 5-1.5 MG/5ML syrup Take 5 mLs by mouth every 6 (six) hours as needed for cough. 80 mL 0  . insulin lispro (HUMALOG) 100 UNIT/ML injection Inject 8 Units into the skin 3 (three) times daily with meals. 8 U at beginning of meal in V-Go 30, up to 100 units/day    . levothyroxine (SYNTHROID, LEVOTHROID) 200 MCG tablet Take 200 mcg  by mouth daily before breakfast.    . metFORMIN (GLUCOPHAGE) 850 MG tablet Take 1 tablet (850 mg total) by mouth 2 (two) times daily with a meal. 180 tablet 3  . metoprolol (LOPRESSOR) 50 MG tablet TAKE ONE (1) TABLET BY MOUTH TWO (2) TIMES DAILY 60 tablet 2  . PROAIR HFA 108 (90 Base) MCG/ACT inhaler INHALE 2 PUFFS INTO THE LUNGS EVERY 6 HOURS AS NEEDED FOR WHEEZING ORSHORTNESS OF BREATH 8.5 g 1  . SUMAtriptan (IMITREX) 50 MG tablet Take 1 tablet (50 mg total) by mouth every 2 (two) hours as needed for migraine or headache. [name] repeat in 2 hours if headache persists or recurs. Do not exceed two doses in 7 day period. 10 tablet 2   No facility-administered medications prior to  visit.     ROS Review of Systems  Constitutional: Negative for chills and fever.  Eyes: Negative for visual disturbance.  Respiratory: Positive for cough. Negative for shortness of breath.   Cardiovascular: Positive for chest pain.  Gastrointestinal: Negative for abdominal pain and blood in stool.  Musculoskeletal: Positive for back pain. Negative for arthralgias.  Skin: Positive for rash.  Allergic/Immunologic: Negative for immunocompromised state.  Hematological: Negative for adenopathy. Does not bruise/bleed easily.  Psychiatric/Behavioral: Negative for dysphoric mood and suicidal ideas.    Objective:  BP 129/87 (BP Location: Right Arm, Patient Position: Sitting, Cuff Size: Small)   Pulse 86   Temp 98.8 F (37.1 C) (Oral)   Ht 5\' 10"  (1.778 m)   Wt (!) 308 lb 9.6 oz (140 kg)   LMP 03/18/2016 (Exact Date)   SpO2 96%   BMI 44.28 kg/m   BP/Weight 04/04/2016 04/03/2016 [id number]  Systolic BP [id number]  Diastolic BP 69 75 85  Wt. (Lbs) - 307 307  BMI - 44.05 44.05   Wt Readings from Last 3 Encounters:  04/22/16 (!) 308 lb 9.6 oz (140 kg)  04/03/16 (!) 307 lb (139.3 kg)  03/18/16 (!) 307 lb (139.3 kg)   Physical Exam  Constitutional: She is oriented to person, place, and time. She appears well-developed and well-nourished. No distress.  Obese   HENT:  Head: Normocephalic and atraumatic.  Nose: Mucosal edema present.  Cardiovascular: Normal rate, regular rhythm, normal heart sounds and intact distal pulses.   Pulmonary/Chest: Effort normal and breath sounds normal.  Musculoskeletal: She exhibits no edema.  Neurological: She is alert and oriented to person, place, and time.  Skin: Skin is warm and dry. No rash noted.     Psychiatric: She has a normal mood and affect.    Assessment & Plan:  Zer was seen today for follow-up and cough.  Diagnoses and all orders for this visit:  Controlled type 2 diabetes mellitus with complication, with long-term current use of  insulin (HCC) -     POCT glucose (manual entry) -     POCT glycosylated hemoglobin (Hb A1C)  Pain in gums -     Ambulatory referral to Dentistry  Edentulous -     Ambulatory referral to Dentistry  Severe persistent asthma without complication -     fluticasone (FLONASE) 50 MCG/ACT nasal spray; Place 2 sprays into both nostrils daily. -     Fluticasone-Salmeterol (ADVAIR) 100-50 MCG/DOSE AEPB; Inhale 1 puff into the lungs 2 (two) times daily. -     Cetirizine HCl 10 MG CAPS; Take 1 capsule (10 mg total) by mouth daily. -     albuterol (PROVENTIL) (2.5 MG/3ML) 0.083% nebulizer solution; Take 3  mLs (2.5 mg total) by nebulization every 6 (six) hours as needed for wheezing or shortness of breath.  Facial dermatitis -     hydrocortisone valerate ointment (WESTCORT) 0.2 %; Apply 1 application topically 2 (two) times daily. Apply to face for rash  Chronic bilateral low back pain without sciatica -     traMADol (ULTRAM) 50 MG tablet; Take 1 tablet (50 mg total) by mouth every 8 (eight) hours as needed. -     methocarbamol (ROBAXIN) 500 MG tablet; Take 1 tablet (500 mg total) by mouth 4 (four) times daily.   There are no diagnoses linked to this encounter.  No orders of the defined types were placed in this encounter.   Follow-up: Return in about 4 weeks (around 05/20/2016) for back pain and asthma .   Boykin Nearing MD

## 2016-04-22 NOTE — Assessment & Plan Note (Signed)
Suspect eczematous dermatitis Mild steroid cream ordered since patient will be applying to face

## 2016-04-22 NOTE — Patient Instructions (Signed)
Melinda [name] was seen today for follow-up and cough.  Diagnoses and all orders for this visit:  Controlled type 2 diabetes mellitus with complication, with long-term current use of insulin (HCC) -     POCT glucose (manual entry) -     POCT glycosylated hemoglobin (Hb A1C)  Pain in gums -     Ambulatory referral to Dentistry  Edentulous -     Ambulatory referral to Dentistry  Severe persistent asthma without complication -     fluticasone (FLONASE) 50 MCG/ACT nasal spray; Place 2 sprays into both nostrils daily. -     Fluticasone-Salmeterol (ADVAIR) 100-50 MCG/DOSE AEPB; Inhale 1 puff into the lungs 2 (two) times daily. -     Cetirizine HCl 10 MG CAPS; Take 1 capsule (10 mg total) by mouth daily. -     albuterol (PROVENTIL) (2.5 MG/3ML) 0.083% nebulizer solution; Take 3 mLs (2.5 mg total) by nebulization every 6 (six) hours as needed for wheezing or shortness of breath.  Facial dermatitis -     hydrocortisone valerate ointment (WESTCORT) 0.2 %; Apply 1 application topically 2 (two) times daily. Apply to face for rash  Chronic bilateral low back pain without sciatica -     traMADol (ULTRAM) 50 MG tablet; Take 1 tablet (50 mg total) by mouth every 8 (eight) hours as needed. -     methocarbamol (ROBAXIN) 500 MG tablet; Take 1 tablet (500 mg total) by mouth 4 (four) times daily.

## 2016-04-22 NOTE — Assessment & Plan Note (Signed)
Low back pain in obese patient tramadol and robaxin ordered

## 2016-04-22 NOTE — Assessment & Plan Note (Signed)
A: chronic with worsening severity P: Add advair, add zyrtec, add flonase Continue prn albuterol Neb machine provided, patient completed paperwork

## 2016-05-09 ENCOUNTER — Other Ambulatory Visit: Payer: Self-pay | Admitting: Family Medicine

## 2016-05-13 ENCOUNTER — Ambulatory Visit: Payer: Medicaid Other | Attending: Family Medicine | Admitting: Family Medicine

## 2016-05-13 ENCOUNTER — Encounter: Payer: Self-pay | Admitting: Family Medicine

## 2016-05-13 VITALS — BP 139/83 | HR 73 | Temp 98.3°F | Ht 70.0 in | Wt 314.6 lb

## 2016-05-13 DIAGNOSIS — M549 Dorsalgia, unspecified: Secondary | ICD-10-CM | POA: Diagnosis present

## 2016-05-13 DIAGNOSIS — Z79899 Other long term (current) drug therapy: Secondary | ICD-10-CM | POA: Diagnosis not present

## 2016-05-13 DIAGNOSIS — Z86718 Personal history of other venous thrombosis and embolism: Secondary | ICD-10-CM | POA: Diagnosis not present

## 2016-05-13 DIAGNOSIS — M545 Low back pain: Secondary | ICD-10-CM | POA: Insufficient documentation

## 2016-05-13 DIAGNOSIS — Z86711 Personal history of pulmonary embolism: Secondary | ICD-10-CM | POA: Insufficient documentation

## 2016-05-13 DIAGNOSIS — E890 Postprocedural hypothyroidism: Secondary | ICD-10-CM | POA: Insufficient documentation

## 2016-05-13 DIAGNOSIS — Z794 Long term (current) use of insulin: Secondary | ICD-10-CM | POA: Insufficient documentation

## 2016-05-13 DIAGNOSIS — Z7901 Long term (current) use of anticoagulants: Secondary | ICD-10-CM | POA: Diagnosis not present

## 2016-05-13 DIAGNOSIS — L309 Dermatitis, unspecified: Secondary | ICD-10-CM | POA: Insufficient documentation

## 2016-05-13 DIAGNOSIS — M25572 Pain in left ankle and joints of left foot: Secondary | ICD-10-CM | POA: Insufficient documentation

## 2016-05-13 DIAGNOSIS — M25472 Effusion, left ankle: Secondary | ICD-10-CM

## 2016-05-13 DIAGNOSIS — G8929 Other chronic pain: Secondary | ICD-10-CM

## 2016-05-13 DIAGNOSIS — Z87891 Personal history of nicotine dependence: Secondary | ICD-10-CM | POA: Insufficient documentation

## 2016-05-13 DIAGNOSIS — E118 Type 2 diabetes mellitus with unspecified complications: Secondary | ICD-10-CM | POA: Insufficient documentation

## 2016-05-13 DIAGNOSIS — J4550 Severe persistent asthma, uncomplicated: Secondary | ICD-10-CM | POA: Diagnosis not present

## 2016-05-13 DIAGNOSIS — Z7951 Long term (current) use of inhaled steroids: Secondary | ICD-10-CM | POA: Diagnosis not present

## 2016-05-13 DIAGNOSIS — Z85850 Personal history of malignant neoplasm of thyroid: Secondary | ICD-10-CM | POA: Insufficient documentation

## 2016-05-13 LAB — GLUCOSE, POCT (MANUAL RESULT ENTRY): POC Glucose: 103 mg/dl — AB (ref 70–99)

## 2016-05-13 MED ORDER — FLUTICASONE-SALMETEROL 100-50 MCG/DOSE IN AEPB: 1.0000 | INHALATION_SPRAY | Freq: Two times a day (BID) | RESPIRATORY_TRACT | 5 refills | Status: DC

## 2016-05-13 MED ORDER — ANKLE BRACE ADJUST-TO-FIT MISC: 1.0000 | Freq: Every day | 0 refills | Status: DC

## 2016-05-13 MED ORDER — FLUTICASONE PROPIONATE 0.05 % EX CREA: TOPICAL_CREAM | Freq: Two times a day (BID) | CUTANEOUS | 2 refills | Status: DC

## 2016-05-13 MED ORDER — PREDNISONE 50 MG PO TABS: 50.0000 mg | ORAL_TABLET | Freq: Every day | ORAL | 0 refills | Status: DC

## 2016-05-13 NOTE — Patient Instructions (Addendum)
Zahia was seen today for back pain.  Diagnoses and all orders for this visit:  Controlled type 2 diabetes mellitus with complication, with long-term current use of insulin (HCC) -     POCT glucose (manual entry)  Severe persistent asthma without complication -     Fluticasone-Salmeterol (ADVAIR DISKUS) 100-50 MCG/DOSE AEPB; Inhale 1 puff into the lungs 2 (two) times daily.  Chronic bilateral low back pain without sciatica -     predniSONE (DELTASONE) 50 MG tablet; Take 1 tablet (50 mg total) by mouth daily with breakfast.  Facial dermatitis -     fluticasone (CUTIVATE) 0.05 % cream; Apply topically 2 (two) times daily.  Pain and swelling of ankle, left -     Elastic Bandages & Supports (ANKLE BRACE ADJUST-TO-FIT) MISC; 1 each by Does not apply route daily.  advair diskus ordered, this is covered Changed steroid cream to fluticasone  5 day course of prednisone to reduce pain  F/u in 6 weeks for chronic pain/asthma  Dr. Adrian Blackwater

## 2016-05-13 NOTE — Assessment & Plan Note (Signed)
advair diskus ordered

## 2016-05-13 NOTE — Progress Notes (Signed)
Pt is here today to follow up on back pain and asthma. Pt is also having neck and head pain.

## 2016-05-13 NOTE — Assessment & Plan Note (Signed)
Changed steroid to  Fluticasone 0.05%

## 2016-05-13 NOTE — Progress Notes (Signed)
Subjective:  Patient ID: Melinda [name], female    DOB: 08-18-1965  Age: 50 y.o. MRN: [id number]  CC: Back Pain   HPI Melinda [name] has multiple medical problems including papillary thyroid carcinoma (s/p total thyroidectomy), diabetes (followed by Endocrinology), asthma,  PE and DVT on eliquis she presents for   1. Dermatitis: noted dryness and flaking on eyelids. Also some dryness on legs. No skin discoloration. She has medicaid which did not cover westcort.   2. Back pain: this is chronic but worsening.She reports pain and stiffness when changing from standing to sitting. Her pain is interfering with her activity level. She has tried flexeril and tramadol in the past when she lived in [place].  She is currently taking robaxin and tramadol without significant relief of pain. She has also pain in both shoulders and upper back. She reports intermittent swelling in her L leg. Her current pain level is 4/10.   3. Asthma: cough improved. No CP. She was not able to get advair inhaler with medicaid. No SOB but intermittent cough.   Social History  Substance Use Topics  . Smoking status: Former Smoker    Packs/day: 0.50    Years: 40.00    Types: Cigarettes    Quit date: 11/03/2014  . Smokeless tobacco: Never Used  . Alcohol use No    Outpatient Medications Prior to Visit  Medication Sig Dispense Refill  . ACCU-CHEK AVIVA PLUS test strip USE TO TEST BLOOD GLUCOSE FOUR TIMES DAILY BEFORE MEALS AND AT BEDTIME 100 each 12  . albuterol (PROVENTIL) (2.5 MG/3ML) 0.083% nebulizer solution Take 3 mLs (2.5 mg total) by nebulization every 6 (six) hours as needed for wheezing or shortness of breath. 150 mL 1  . apixaban (ELIQUIS) 5 MG TABS tablet 10 mg twice daily for one week, followed by 5 mg twice daily 60 tablet 2  . atorvastatin (LIPITOR) 20 MG tablet Take 1 tablet (20 mg total) by mouth daily. 90 tablet 3  . Cetirizine HCl 10 MG CAPS Take 1 capsule (10 mg total) by mouth daily. 30 capsule 5  .  diphenhydrAMINE (BENADRYL) 25 MG tablet Take 50 mg by mouth at bedtime as needed for sleep.     Marland Kitchen ELIQUIS 5 MG TABS tablet TAKE ONE (1) TABLET BY MOUTH TWO (2) TIMES DAILY 60 tablet 2  . ferrous sulfate 325 (65 FE) MG tablet TAKE ONE (1) TABLET BY MOUTH TWO (2) TIMES DAILY WITH A MEAL 60 tablet 2  . fluticasone (FLONASE) 50 MCG/ACT nasal spray Place 2 sprays into both nostrils daily. 16 g 6  . gabapentin (NEURONTIN) 100 MG capsule TAKE 1 CAPSULE BY MOUTH THREE TIMES A DAY 90 capsule 2  . insulin lispro (HUMALOG) 100 UNIT/ML injection Inject 6 Units into the skin 3 (three) times daily with meals. 8 U at beginning of meal in V-Go 30, up to 100 units/day     . levothyroxine (SYNTHROID, LEVOTHROID) 200 MCG tablet Take 200 mcg by mouth daily before breakfast.    . metFORMIN (GLUCOPHAGE) 850 MG tablet Take 1 tablet (850 mg total) by mouth 2 (two) times daily with a meal. 180 tablet 3  . methocarbamol (ROBAXIN) 500 MG tablet Take 1 tablet (500 mg total) by mouth 4 (four) times daily. 60 tablet 2  . metoprolol (LOPRESSOR) 50 MG tablet TAKE ONE (1) TABLET BY MOUTH TWO (2) TIMES DAILY 60 tablet 3  . PROAIR HFA 108 (90 Base) MCG/ACT inhaler INHALE 2 PUFFS INTO THE LUNGS EVERY 6  HOURS AS NEEDED FOR WHEEZING ORSHORTNESS OF BREATH 8.5 g 1  . SUMAtriptan (IMITREX) 50 MG tablet Take 1 tablet (50 mg total) by mouth every 2 (two) hours as needed for migraine or headache. [name] repeat in 2 hours if headache persists or recurs. Do not exceed two doses in 7 day period. 10 tablet 2  . traMADol (ULTRAM) 50 MG tablet Take 1 tablet (50 mg total) by mouth every 8 (eight) hours as needed. 60 tablet 0  . acetaminophen (TYLENOL) 500 MG tablet Take 500-1,000 mg by mouth every 6 (six) hours as needed for moderate pain (patient takes 1-2 depending on pain level).     . Fluticasone-Salmeterol (ADVAIR) 100-50 MCG/DOSE AEPB Inhale 1 puff into the lungs 2 (two) times daily. (Patient not taking: Reported on 05/13/2016) 1 each 3  .  hydrocortisone valerate ointment (WESTCORT) 0.2 % Apply 1 application topically 2 (two) times daily. Apply to face for rash (Patient not taking: Reported on 05/13/2016) 45 g 0   No facility-administered medications prior to visit.     ROS Review of Systems  Constitutional: Negative for chills and fever.  Eyes: Negative for visual disturbance.  Respiratory: Positive for cough. Negative for shortness of breath.   Cardiovascular: Positive for leg swelling (on left side ).  Gastrointestinal: Negative for abdominal pain and blood in stool.  Musculoskeletal: Positive for arthralgias (shoulders, hips, ankles ) and back pain.  Skin: Positive for rash.  Allergic/Immunologic: Negative for immunocompromised state.  Hematological: Negative for adenopathy. Does not bruise/bleed easily.  Psychiatric/Behavioral: Negative for dysphoric mood and suicidal ideas.    Objective:  BP 139/83 (BP Location: Left Arm, Patient Position: Sitting, Cuff Size: Large)   Pulse 73   Temp 98.3 F (36.8 C) (Oral)   Ht 5\' 10"  (1.778 m)   Wt (!) 314 lb 9.6 oz (142.7 kg)   LMP 04/22/2016   SpO2 97%   BMI 45.14 kg/m   BP/Weight 05/13/2016 04/22/2016 123456  Systolic BP [id number] Q000111Q Q000111Q  Diastolic BP 83 87 69  Wt. (Lbs) 314.6 308.6 -  BMI 45.14 44.28 -   Wt Readings from Last 3 Encounters:  05/13/16 (!) 314 lb 9.6 oz (142.7 kg)  04/22/16 (!) 308 lb 9.6 oz (140 kg)  04/03/16 (!) 307 lb (139.3 kg)   Physical Exam  Constitutional: She is oriented to person, place, and time. She appears well-developed and well-nourished. No distress.  Obese   HENT:  Head: Normocephalic and atraumatic.  Nose: No mucosal edema.  Cardiovascular: Normal rate, regular rhythm, normal heart sounds and intact distal pulses.   Pulmonary/Chest: Effort normal and breath sounds normal.  Musculoskeletal: She exhibits no edema.  Neurological: She is alert and oriented to person, place, and time.  Skin: Skin is warm and dry. No rash noted.      Psychiatric: She has a normal mood and affect.   Lab Results  Component Value Date   HGBA1C 5.5 04/22/2016   CBG 103  Assessment & Plan:  Melinda [name] was seen today for back pain.  Diagnoses and all orders for this visit:  Controlled type 2 diabetes mellitus with complication, with long-term current use of insulin ([facility]) -     POCT glucose (manual entry)  Severe persistent asthma without complication -     Fluticasone-Salmeterol (ADVAIR DISKUS) 100-50 MCG/DOSE AEPB; Inhale 1 puff into the lungs 2 (two) times daily.  Chronic bilateral low back pain without sciatica -     predniSONE (DELTASONE) 50 MG tablet; Take 1 tablet (  50 mg total) by mouth daily with breakfast.  Facial dermatitis -     fluticasone (CUTIVATE) 0.05 % cream; Apply topically 2 (two) times daily.  Pain and swelling of ankle, left -     Elastic Bandages & Supports (ANKLE BRACE ADJUST-TO-FIT) MISC; 1 each by Does not apply route daily.   There are no diagnoses linked to this encounter.  No orders of the defined types were placed in this encounter.   Follow-up: No Follow-up on file.   Boykin Nearing MD

## 2016-05-13 NOTE — Assessment & Plan Note (Signed)
Short course of predisone Avoiding NSAIDs she is on eliquis  Continue robaxin and tramadol

## 2016-05-14 ENCOUNTER — Telehealth: Payer: Self-pay | Admitting: Family Medicine

## 2016-05-14 DIAGNOSIS — J4550 Severe persistent asthma, uncomplicated: Secondary | ICD-10-CM

## 2016-05-14 MED ORDER — FLUTICASONE-SALMETEROL 100-50 MCG/DOSE IN AEPB: 1.0000 | INHALATION_SPRAY | Freq: Two times a day (BID) | RESPIRATORY_TRACT | 5 refills | Status: DC

## 2016-05-14 NOTE — Telephone Encounter (Signed)
Approved through Medicaid: [id number] [place] aware of approval.

## 2016-05-14 NOTE — Telephone Encounter (Signed)
[facility] pharmacy called requesting a prior Authorization for Fluticasone-Salmeterol (ADVAIR DISKUS) 100-50 MCG/DOSE AEPB Please f/u

## 2016-05-21 ENCOUNTER — Encounter: Payer: Self-pay | Admitting: Interventional Radiology

## 2016-06-10 ENCOUNTER — Other Ambulatory Visit: Payer: Self-pay | Admitting: Family Medicine

## 2016-06-27 ENCOUNTER — Encounter: Payer: Self-pay | Admitting: Family Medicine

## 2016-06-27 ENCOUNTER — Ambulatory Visit: Payer: Medicaid Other | Attending: Family Medicine | Admitting: Family Medicine

## 2016-06-27 VITALS — BP 117/82 | HR 86 | Temp 98.2°F | Ht 70.0 in | Wt 319.4 lb

## 2016-06-27 DIAGNOSIS — Z87891 Personal history of nicotine dependence: Secondary | ICD-10-CM | POA: Insufficient documentation

## 2016-06-27 DIAGNOSIS — Z794 Long term (current) use of insulin: Secondary | ICD-10-CM | POA: Insufficient documentation

## 2016-06-27 DIAGNOSIS — G43709 Chronic migraine without aura, not intractable, without status migrainosus: Secondary | ICD-10-CM | POA: Diagnosis not present

## 2016-06-27 DIAGNOSIS — D500 Iron deficiency anemia secondary to blood loss (chronic): Secondary | ICD-10-CM | POA: Insufficient documentation

## 2016-06-27 DIAGNOSIS — Z7951 Long term (current) use of inhaled steroids: Secondary | ICD-10-CM | POA: Insufficient documentation

## 2016-06-27 DIAGNOSIS — M545 Low back pain, unspecified: Secondary | ICD-10-CM

## 2016-06-27 DIAGNOSIS — Z79899 Other long term (current) drug therapy: Secondary | ICD-10-CM | POA: Insufficient documentation

## 2016-06-27 DIAGNOSIS — J45909 Unspecified asthma, uncomplicated: Secondary | ICD-10-CM | POA: Diagnosis present

## 2016-06-27 DIAGNOSIS — E118 Type 2 diabetes mellitus with unspecified complications: Secondary | ICD-10-CM | POA: Diagnosis not present

## 2016-06-27 DIAGNOSIS — Z9889 Other specified postprocedural states: Secondary | ICD-10-CM

## 2016-06-27 DIAGNOSIS — J4550 Severe persistent asthma, uncomplicated: Secondary | ICD-10-CM | POA: Diagnosis not present

## 2016-06-27 DIAGNOSIS — Z9089 Acquired absence of other organs: Secondary | ICD-10-CM | POA: Insufficient documentation

## 2016-06-27 DIAGNOSIS — R202 Paresthesia of skin: Secondary | ICD-10-CM | POA: Insufficient documentation

## 2016-06-27 DIAGNOSIS — Z7901 Long term (current) use of anticoagulants: Secondary | ICD-10-CM | POA: Insufficient documentation

## 2016-06-27 DIAGNOSIS — E890 Postprocedural hypothyroidism: Secondary | ICD-10-CM

## 2016-06-27 DIAGNOSIS — G8929 Other chronic pain: Secondary | ICD-10-CM

## 2016-06-27 LAB — COMPLETE METABOLIC PANEL WITH GFR
ALBUMIN: 4.1 g/dL (ref 3.6–5.1)
ALT: 13 U/L (ref 6–29)
AST: 10 U/L (ref 10–35)
Alkaline Phosphatase: 100 U/L (ref 33–115)
BILIRUBIN TOTAL: 0.3 mg/dL (ref 0.2–1.2)
BUN: 12 mg/dL (ref 7–25)
CALCIUM: 8.9 mg/dL (ref 8.6–10.2)
CO2: 22 mmol/L (ref 20–31)
CREATININE: 0.72 mg/dL (ref 0.50–1.10)
Chloride: 105 mmol/L (ref 98–110)
GFR, Est Non African American: >89 mL/min (ref >=60)
Glucose, Bld: 83 mg/dL (ref 65–99)
Potassium: 4.2 mmol/L (ref 3.5–5.3)
Sodium: 138 mmol/L (ref 135–146)
TOTAL PROTEIN: 7.1 g/dL (ref 6.1–8.1)

## 2016-06-27 LAB — TSH: TSH: 12.9 m[IU]/L — AB

## 2016-06-27 LAB — GLUCOSE, POCT (MANUAL RESULT ENTRY): POC GLUCOSE: 105 mg/dL — AB (ref 70–99)

## 2016-06-27 LAB — IRON AND TIBC
%SAT: 7 % — AB (ref 11–50)
Iron: 30 ug/dL — ABNORMAL LOW (ref 40–190)
TIBC: 402 ug/dL (ref 250–450)
UIBC: 372 ug/dL (ref 125–400)

## 2016-06-27 MED ORDER — TRAMADOL HCL 50 MG PO TABS: 50.0000 mg | ORAL_TABLET | Freq: Three times a day (TID) | ORAL | 2 refills | Status: DC | PRN

## 2016-06-27 MED ORDER — CYANOCOBALAMIN 1000 MCG/ML IJ SOLN
1000.0000 ug | Freq: Once | INTRAMUSCULAR | Status: AC
  Administered 2016-06-27: 1000 ug via INTRAMUSCULAR

## 2016-06-27 MED ORDER — METHOCARBAMOL 500 MG PO TABS: 500.0000 mg | ORAL_TABLET | Freq: Four times a day (QID) | ORAL | 2 refills | Status: DC

## 2016-06-27 NOTE — Progress Notes (Signed)
Subjective:  Patient ID: Melinda [name], female    DOB: 07-26-1965  Age: 50 y.o. MRN: [id number]  CC: Asthma   HPI Rozlin Trezise presents for    1. Asthma: she has started Advair inhaler. No SOB. Intermittent cough. No chest pain.   2. Migraines: since age 9. Lately occurring daily. Imitrex is not aborting migraine. She has sinus pressure. She is using Flonase. Feels nares is dry.   3. Paresthesias: in hands, feet, around mouth. Tingling, heaviness. Symptoms come and go. She has excessive fatigue. Has history of IDA, taking iron BID. CBGs are normal at home. No lows or excessive highs.   Social History  Substance Use Topics  . Smoking status: Former Smoker    Packs/day: 0.50    Years: 40.00    Types: Cigarettes    Quit date: 11/03/2014  . Smokeless tobacco: Never Used  . Alcohol use No    Outpatient Medications Prior to Visit  Medication Sig Dispense Refill  . ACCU-CHEK AVIVA PLUS test strip USE TO TEST BLOOD GLUCOSE FOUR TIMES DAILY BEFORE MEALS AND AT BEDTIME 100 each 12  . acetaminophen (TYLENOL) 500 MG tablet Take 500-1,000 mg by mouth every 6 (six) hours as needed for moderate pain (patient takes 1-2 depending on pain level).     Marland Kitchen albuterol (PROVENTIL) (2.5 MG/3ML) 0.083% nebulizer solution Take 3 mLs (2.5 mg total) by nebulization every 6 (six) hours as needed for wheezing or shortness of breath. 150 mL 1  . apixaban (ELIQUIS) 5 MG TABS tablet 10 mg twice daily for one week, followed by 5 mg twice daily 60 tablet 2  . atorvastatin (LIPITOR) 20 MG tablet Take 1 tablet (20 mg total) by mouth daily. 90 tablet 3  . Elastic Bandages & Supports (ANKLE BRACE ADJUST-TO-FIT) MISC 1 each by Does not apply route daily. 1 each 0  . ferrous sulfate 325 (65 FE) MG tablet TAKE 1 TABLET BY MOUTH 2 TIMES DAILY WITH A MEAL 60 tablet 11  . fluticasone (CUTIVATE) 0.05 % cream Apply topically 2 (two) times daily. 30 g 2  . fluticasone (FLONASE) 50 MCG/ACT nasal spray Place 2 sprays into both  nostrils daily. 16 g 6  . Fluticasone-Salmeterol (ADVAIR DISKUS) 100-50 MCG/DOSE AEPB Inhale 1 puff into the lungs 2 (two) times daily. 60 each 5  . gabapentin (NEURONTIN) 100 MG capsule TAKE 1 CAPSULE BY MOUTH THREE TIMES A DAY 90 capsule 2  . insulin lispro (HUMALOG) 100 UNIT/ML injection Inject 6 Units into the skin 3 (three) times daily with meals. 8 U at beginning of meal in V-Go 30, up to 100 units/day     . levothyroxine (SYNTHROID, LEVOTHROID) 200 MCG tablet Take 200 mcg by mouth daily before breakfast.    . metFORMIN (GLUCOPHAGE) 850 MG tablet Take 1 tablet (850 mg total) by mouth 2 (two) times daily with a meal. 180 tablet 3  . methocarbamol (ROBAXIN) 500 MG tablet Take 1 tablet (500 mg total) by mouth 4 (four) times daily. 60 tablet 2  . metoprolol (LOPRESSOR) 50 MG tablet TAKE ONE (1) TABLET BY MOUTH TWO (2) TIMES DAILY 60 tablet 3  . PROAIR HFA 108 (90 Base) MCG/ACT inhaler INHALE 2 PUFFS INTO THE LUNGS EVERY 6 HOURS AS NEEDED FOR WHEEZING ORSHORTNESS OF BREATH 8.5 g 1  . Cetirizine HCl 10 MG CAPS Take 1 capsule (10 mg total) by mouth daily. (Patient not taking: Reported on 06/27/2016) 30 capsule 5  . diphenhydrAMINE (BENADRYL) 25 MG tablet Take 50 mg  by mouth at bedtime as needed for sleep.     Marland Kitchen ELIQUIS 5 MG TABS tablet TAKE ONE (1) TABLET BY MOUTH TWO (2) TIMES DAILY (Patient not taking: Reported on 06/27/2016) 60 tablet 11  . predniSONE (DELTASONE) 50 MG tablet Take 1 tablet (50 mg total) by mouth daily with breakfast. (Patient not taking: Reported on 06/27/2016) 5 tablet 0  . SUMAtriptan (IMITREX) 50 MG tablet Take 1 tablet (50 mg total) by mouth every 2 (two) hours as needed for migraine or headache. [name] repeat in 2 hours if headache persists or recurs. Do not exceed two doses in 7 day period. (Patient not taking: Reported on 06/27/2016) 10 tablet 2  . traMADol (ULTRAM) 50 MG tablet Take 1 tablet (50 mg total) by mouth every 8 (eight) hours as needed. (Patient not taking: Reported on  06/27/2016) 60 tablet 0   No facility-administered medications prior to visit.     ROS Review of Systems  Constitutional: Negative for chills and fever.  Eyes: Negative for visual disturbance.  Respiratory: Positive for cough. Negative for shortness of breath.   Gastrointestinal: Negative for abdominal pain and blood in stool.  Musculoskeletal: Positive for arthralgias (shoulders, hips, ankles ) and back pain.  Allergic/Immunologic: Negative for immunocompromised state.  Neurological: Positive for headaches.  Hematological: Negative for adenopathy. Does not bruise/bleed easily.  Psychiatric/Behavioral: Negative for dysphoric mood and suicidal ideas.    Objective:  BP 117/82 (BP Location: Left Arm, Patient Position: Sitting, Cuff Size: Small)   Pulse 86   Temp 98.2 F (36.8 C) (Oral)   Ht 5\' 10"  (1.778 m)   Wt (!) 319 lb 6.4 oz (144.9 kg)   LMP 05/23/2016   SpO2 98%   BMI 45.83 kg/m   BP/Weight 06/27/2016 05/13/2016 0000000  Systolic BP 123XX123 [id number] Q000111Q  Diastolic BP 82 83 87  Wt. (Lbs) 319.4 314.6 308.6  BMI 45.83 45.14 44.28   Physical Exam  Constitutional: She is oriented to person, place, and time. She appears well-developed and well-nourished. No distress.  Obese   HENT:  Head: Normocephalic and atraumatic.  Nose: No mucosal edema.  Cardiovascular: Normal rate, regular rhythm, normal heart sounds and intact distal pulses.   Pulmonary/Chest: Effort normal and breath sounds normal.  Musculoskeletal: She exhibits no edema.  Neurological: She is alert and oriented to person, place, and time.  Skin: Skin is warm and dry. No rash noted.  Psychiatric: She has a normal mood and affect.   Lab Results  Component Value Date   HGBA1C 5.5 04/22/2016   CBG 105 Lab Results  Component Value Date   TSH 0.432 08/12/2015    Assessment & Plan:   Nikola was seen today for asthma.  Diagnoses and all orders for this visit:  Controlled type 2 diabetes mellitus with  complication, with long-term current use of insulin ([place]) -     POCT glucose (manual entry) -     COMPLETE METABOLIC PANEL WITH GFR -     Vitamin B12 -     cyanocobalamin ((VITAMIN B-12)) injection 1,000 mcg; Inject 1 mL (1,000 mcg total) into the muscle once.  Severe persistent asthma without complication  Chronic migraine without aura without status migrainosus, not intractable -     Ambulatory referral to Neurology  Chronic bilateral low back pain without sciatica -     methocarbamol (ROBAXIN) 500 MG tablet; Take 1 tablet (500 mg total) by mouth 4 (four) times daily. -     traMADol (ULTRAM) 50 MG tablet;  Take 1 tablet (50 mg total) by mouth every 8 (eight) hours as needed.  Iron deficiency anemia due to chronic blood loss -     Iron and TIBC -     CBC  S/P thyroidectomy -     TSH    No orders of the defined types were placed in this encounter.   Follow-up: Return in about 3 months (around 09/25/2016) for diabetes .   Boykin Nearing MD

## 2016-06-27 NOTE — Patient Instructions (Addendum)
Monifah was seen today for asthma.  Diagnoses and all orders for this visit:  Controlled type 2 diabetes mellitus with complication, with long-term current use of insulin ([place]) -     POCT glucose (manual entry) -     COMPLETE METABOLIC PANEL WITH GFR -     Vitamin B12 -     cyanocobalamin ((VITAMIN B-12)) injection 1,000 mcg; Inject 1 mL (1,000 mcg total) into the muscle once.  Severe persistent asthma without complication  Chronic migraine without aura without status migrainosus, not intractable -     Ambulatory referral to Neurology  Chronic bilateral low back pain without sciatica -     methocarbamol (ROBAXIN) 500 MG tablet; Take 1 tablet (500 mg total) by mouth 4 (four) times daily. -     traMADol (ULTRAM) 50 MG tablet; Take 1 tablet (50 mg total) by mouth every 8 (eight) hours as needed.  Iron deficiency anemia due to chronic blood loss -     Iron and TIBC -     CBC  S/P thyroidectomy -     TSH   Use nasal saline for dry nose   You will be called with lab results You will be called with neurology appt   F/u in 3 months for diabetes   Dr. Adrian Blackwater

## 2016-06-27 NOTE — Assessment & Plan Note (Signed)
Chronic migraine More persistent  Plan: Neurology referral tramadol and robaxin for pain

## 2016-06-28 ENCOUNTER — Telehealth: Payer: Self-pay

## 2016-06-28 LAB — CBC
HCT: 44.2 % (ref 35.0–45.0)
HEMOGLOBIN: 14.3 g/dL (ref 11.7–15.5)
MCH: 28.4 pg (ref 27.0–33.0)
MCHC: 32.4 g/dL (ref 32.0–36.0)
MCV: 87.7 fL (ref 80.0–100.0)
MPV: 10.7 fL (ref 7.5–12.5)
Platelets: 294 10*3/uL (ref 140–400)
RBC: 5.04 MIL/uL (ref 3.80–5.10)
RDW: 14.7 % (ref 11.0–15.0)
WBC: 11.7 10*3/uL — ABNORMAL HIGH (ref 3.8–10.8)

## 2016-06-28 LAB — VITAMIN B12

## 2016-06-28 MED ORDER — LEVOTHYROXINE SODIUM 25 MCG PO TABS: 25.0000 ug | ORAL_TABLET | Freq: Every day | ORAL | 2 refills | Status: DC

## 2016-06-28 NOTE — Addendum Note (Signed)
Addended by: Boykin Nearing on: 06/28/2016 09:07 AM   Modules accepted: Orders

## 2016-06-28 NOTE — Telephone Encounter (Signed)
Pt was called and informed of lab results, pt understands the change in her medication and states that she will pick up the medication tomorrow.

## 2016-07-08 ENCOUNTER — Other Ambulatory Visit: Payer: Self-pay | Admitting: Family Medicine

## 2016-07-09 ENCOUNTER — Ambulatory Visit (INDEPENDENT_AMBULATORY_CARE_PROVIDER_SITE_OTHER): Payer: Medicaid Other | Admitting: Neurology

## 2016-07-09 ENCOUNTER — Encounter: Payer: Self-pay | Admitting: Neurology

## 2016-07-09 VITALS — BP 118/76 | HR 72 | Ht 70.0 in | Wt 324.7 lb

## 2016-07-09 DIAGNOSIS — G43709 Chronic migraine without aura, not intractable, without status migrainosus: Secondary | ICD-10-CM | POA: Diagnosis not present

## 2016-07-09 DIAGNOSIS — IMO0002 Reserved for concepts with insufficient information to code with codable children: Secondary | ICD-10-CM

## 2016-07-09 DIAGNOSIS — R200 Anesthesia of skin: Secondary | ICD-10-CM

## 2016-07-09 MED ORDER — RIZATRIPTAN BENZOATE 10 MG PO TABS: ORAL_TABLET | ORAL | 3 refills | Status: DC

## 2016-07-09 MED ORDER — ONDANSETRON 4 MG PO TBDP: 4.0000 mg | ORAL_TABLET | Freq: Three times a day (TID) | ORAL | 0 refills | Status: DC | PRN

## 2016-07-09 MED ORDER — TOPIRAMATE 50 MG PO TABS: 50.0000 mg | ORAL_TABLET | Freq: Every day | ORAL | 3 refills | Status: DC

## 2016-07-09 NOTE — Progress Notes (Signed)
NEUROLOGY CONSULTATION NOTE  Melinda [name] MRN: [id number] DOB: 02-10-1966  Referring provider: Dr. Adrian Blackwater Primary care provider: Dr. Adrian Blackwater  Reason for consult:  migraines  HISTORY OF PRESENT ILLNESS: Melinda [name] is a 51 year old right-handed female with type 2 diabetes, asthma, iron deficiency anemia, chronic low back pain, PE/DVT, PTSD, and history of thyroid cancer status post thyroidectomy who presents for migraine.  History supplemented by PCP note.  Onset:  For over 20 years but became daily 4 months. Location:  Right sided Quality:  squeezing Intensity:  8.5/10 (sometimes 10/10) Aura:  Sometimes preceded with visual aura (sees a pinwheel) Prodrome:  no Associated symptoms:  Nausea, photophobia, phonophobia.  No worse headache of life, change in quality, wakes her up from sleep. Duration:  3 hours to all day Frequency:  Prior to 4 months ago, they occurred once a month.  Now they are daily Triggers/exacerbating factors:  Apples, aged cheese Relieving factors:  no Activity:  Light activity aggravates  Past NSAIDS:  ibuprofen Past analgesics:  Excedrin Past abortive triptans:  Sumatriptan tablet/NS/[place] effective in past but 50mg  tablet no longer effective for this daily headache. Past muscle relaxants:  no Past anti-emetic:  no Past antihypertensive medications:  no Past antidepressant medications:  no Past anticonvulsant medications:  no Past vitamins/Herbal/Supplements:  no Past antihistamines/decongestants:  no Other past therapies:  no  Current NSAIDS:  Contraindicated (on anticoagulation) Current analgesics:  acetaminophen, tramadol (daily Current triptans:  no Current anti-emetic:  no Current muscle relaxants: methocarbamol Current anti-anxiolytic:  no Current sleep aide:  no Current Antihypertensive medications:  metoprolol 50mg  twice daily Current Antidepressant medications:  bupropion 150mg  Current Anticonvulsant medications:  gabapentin 100mg  three  times daily Current Vitamins/Herbal/Supplements:  ferrous sulfate Current Antihistamines/Decongestants:  Flonase Other therapy:  no  Caffeine:  no Alcohol:  no Smoker:  no Diet:  Hydrates.  No soda.  Eats baked and broiled chicken.  No fried foods Exercise:  No (limited due to chronic pain) Depression/anxiety:  Recently started therapy and seeing a psychiatrist.  She has had increased stress due to several medical problems over the past couple of years (recurrent PE/DVT, thyroid cancer requiring surgery) Sleep hygiene:  okay Family history of headache:  Son has migraines.  Labs from 06/27/16:  CBC with WBC 11.7, HGB 14.3, HCT 44.2, PLT 294; CMP with Na 138, K 4.2, glucose 83, BUN 12, Cr 0.72, total bili 0.3, ALP 100, AST 10, and ALT 13; B12 over 2000; TSH 12.90 (levothyroxine was increased from 200 mcg to 225 mcg daily). CT of head from 01/16/15 was personally reviewed and was unremarkable.  PAST MEDICAL HISTORY: Past Medical History:  Diagnosis Date  . ADHD (attention deficit hyperactivity disorder)   . Arthritis   . Asthma   . Chicken pox AGE 82  . Diabetes mellitus without complication ([place])   . GERD (gastroesophageal reflux disease)   . Glaucoma    BOTH EYES, NO EYE DROPS  . History of blood transfusion MARCH 2016    2UNITS GIVEN AND IRON GIVEN  . Hyperglycemia 09/09/2014  . Hypertension   . 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 Iron deficiency anemia due to chronic blood loss 09/09/2014  . Low TSH level 09/08/2014  . Lumbar herniated disc   . Menorrhagia 09/08/2014  . Migraine    CLUSTER AND MIGRAINES  . Multiple thyroid nodules   . Ovarian mass, right 09/09/2014  .  PE (pulmonary embolism) 2016  . Peripheral neuropathy (H[facility])    FINGER TIPS AND TOES NUMB SOME  . Preeclampsia  1983   WITH PREGNANCY  . PTSD (post-traumatic stress disorder)   . Scoliosis   . Seizures ([place]) AGE 60   NONE SINCE, HAD  CHICKEN POX THEN    PAST SURGICAL HISTORY: Past Surgical History:  Procedure Laterality Date  . ANGIOGRAM TO LEG  08-13-15   RIGHT  . CHOLECYSTECTOMY    . IR GENERIC HISTORICAL  04/04/2016   IR RADIOLOGIST EVAL & MGMT 04/04/2016 [name], MD [facility] INTERV RAD  . THYROIDECTOMY N/A 11/17/2015   Procedure: TOTAL THYROIDECTOMY;  Surgeon: Armandina Gemma, MD;  Location: [facility] ORS;  Service: General;  Laterality: N/A;  . UTERINE ARTERY EMBOLIZATION Bilateral 08/13/2015    MEDICATIONS: Current Outpatient Prescriptions on File Prior to Visit  Medication Sig Dispense Refill  . ACCU-CHEK AVIVA PLUS test strip USE TO TEST BLOOD GLUCOSE FOUR TIMES DAILY BEFORE MEALS AND AT BEDTIME 100 each 12  . albuterol (PROVENTIL) (2.5 MG/3ML) 0.083% nebulizer solution Take 3 mLs (2.5 mg total) by nebulization every 6 (six) hours as needed for wheezing or shortness of breath. 150 mL 1  . atorvastatin (LIPITOR) 20 MG tablet Take 1 tablet (20 mg total) by mouth daily. 90 tablet 3  . buPROPion (WELLBUTRIN XL) 150 MG 24 hr tablet Take 150 mg by mouth daily.    . Cetirizine HCl 10 MG CAPS Take 1 capsule (10 mg total) by mouth daily. 30 capsule 5  . Elastic Bandages & Supports (ANKLE BRACE ADJUST-TO-FIT) MISC 1 each by Does not apply route daily. 1 each 0  . ELIQUIS 5 MG TABS tablet TAKE ONE (1) TABLET BY MOUTH TWO (2) TIMES DAILY 60 tablet 11  . ferrous sulfate 325 (65 FE) MG tablet TAKE 1 TABLET BY MOUTH 2 TIMES DAILY WITH A MEAL 60 tablet 11  . fluticasone (CUTIVATE) 0.05 % cream Apply topically 2 (two) times daily. 30 g 2  . fluticasone (FLONASE) 50 MCG/ACT nasal spray Place 2 sprays into both nostrils daily. 16 g 6  . Fluticasone-Salmeterol (ADVAIR DISKUS) 100-50 MCG/DOSE AEPB Inhale 1 puff into the lungs 2 (two) times daily. 60 each 5  . gabapentin (NEURONTIN) 100 MG capsule TAKE 1 CAPSULE BY MOUTH THREE TIMES A DAY 90 capsule 2  . insulin lispro (HUMALOG) 100 UNIT/ML injection Inject 6 Units into the skin 3 (three)  times daily with meals. 8 U at beginning of meal in V-Go 30, up to 100 units/day     . levothyroxine (SYNTHROID, LEVOTHROID) 200 MCG tablet Take 200 mcg by mouth daily before breakfast.    . levothyroxine (SYNTHROID, LEVOTHROID) 25 MCG tablet Take 1 tablet (25 mcg total) by mouth daily before breakfast. Take with 200 mcg levothyroxine sodium tablet to make dose of 225 mcg 30 tablet 2  . metFORMIN (GLUCOPHAGE) 850 MG tablet Take 1 tablet (850 mg total) by mouth 2 (two) times daily with a meal. 180 tablet 3  . methocarbamol (ROBAXIN) 500 MG tablet Take 1 tablet (500 mg total) by mouth 4 (four) times daily. 60 tablet 2  . metoprolol (LOPRESSOR) 50 MG tablet TAKE ONE (1) TABLET BY MOUTH TWO (2) TIMES DAILY 60 tablet 3  . prazosin (MINIPRESS) 1 MG capsule Take 1 mg by mouth at bedtime.    Marland Kitchen PROAIR HFA 108 (90 Base) MCG/ACT inhaler INHALE 2 PUFFS INTO THE LUNGS EVERY 6 HOURS AS NEEDED FOR WHEEZING ORSHORTNESS OF BREATH 8.5 g  0  . SUMAtriptan (IMITREX) 50 MG tablet Take 1 tablet (50 mg total) by mouth every 2 (two) hours as needed for migraine or headache. May repeat in 2 hours if headache persists or recurs. Do not exceed two doses in 7 day period. 10 tablet 2  . traMADol (ULTRAM) 50 MG tablet Take 1 tablet (50 mg total) by mouth every 8 (eight) hours as needed. 60 tablet 2  . acetaminophen (TYLENOL) 500 MG tablet Take 500-1,000 mg by mouth every 6 (six) hours as needed for moderate pain (patient takes 1-2 depending on pain level).     Marland Kitchen apixaban (ELIQUIS) 5 MG TABS tablet 10 mg twice daily for one week, followed by 5 mg twice daily (Patient not taking: Reported on 07/09/2016) 60 tablet 2  . diphenhydrAMINE (BENADRYL) 25 MG tablet Take 50 mg by mouth at bedtime as needed for sleep.      No current facility-administered medications on file prior to visit.     ALLERGIES: Allergies  Allergen Reactions  . Ceftriaxone Anaphylaxis    ROCEPHIN  . Penicillins Shortness Of Breath    Has patient had a PCN  reaction causing immediate rash, facial/tongue/throat swelling, SOB or lightheadedness with hypotension: Yes Has patient had a PCN reaction causing severe rash involving mucus membranes or skin necrosis: No Has patient had a PCN reaction that required hospitalization No Has patient had a PCN reaction occurring within the last 10 years: No If all of the above answers are "NO", then may proceed with Cephalosporin use.   . Shellfish Allergy Anaphylaxis  . Gold-Containing Drug Products     HANDS ITCH  . Nickel     HANDS SWELL  . Citrus Rash    FAMILY HISTORY: Family History  Problem Relation Age of Onset  . Diabetes Mother   . Breast cancer Mother   . CAD Mother   . Hypertension Mother   . Alcohol abuse Father   . Hypertension Father     SOCIAL HISTORY: Social History   Social History  . Marital status: Single    Spouse name: N/A  . Number of children: N/A  . Years of education: N/A   Occupational History  . Not on file.   Social History Main Topics  . Smoking status: Former Smoker    Packs/day: 0.50    Years: 40.00    Types: Cigarettes    Quit date: 11/03/2014  . Smokeless tobacco: Never Used  . Alcohol use No  . Drug use: No  . Sexual activity: No   Other Topics Concern  . Not on file   Social History Narrative  . No narrative on file    REVIEW OF SYSTEMS: Constitutional: No fevers, chills, or sweats, no generalized fatigue, change in appetite Eyes: No visual changes, double vision, eye pain Ear, nose and throat: No hearing loss, ear pain, nasal congestion, sore throat Cardiovascular: No chest pain, palpitations Respiratory:  No shortness of breath at rest or with exertion, wheezes GastrointestinaI: No nausea, vomiting, diarrhea, abdominal pain, fecal incontinence Genitourinary:  No dysuria, urinary retention or frequency Musculoskeletal:  back pain Integumentary: No rash, pruritus, skin lesions Neurological: as above Psychiatric: No depression, insomnia,  anxiety Endocrine: No palpitations, fatigue, diaphoresis, mood swings, change in appetite, change in weight, increased thirst Hematologic/Lymphatic:  No purpura, petechiae. Allergic/Immunologic: no itchy/runny eyes, nasal congestion, recent allergic reactions, rashes  PHYSICAL EXAM: Vitals:   07/09/16 1228  BP: 118/76  Pulse: 72   General: No acute distress.  Patient appears  well-groomed.  Morbidly obese Head:  Normocephalic/atraumatic Eyes:  fundi examined but not visualized Neck: supple, no paraspinal tenderness, full range of motion Back: No paraspinal tenderness Heart: regular rate and rhythm Lungs: Clear to auscultation bilaterally. Vascular: No carotid bruits. Neurological Exam: Mental status: alert and oriented to person, place, and time, recent and remote memory intact, fund of knowledge intact, attention and concentration intact, speech fluent and not dysarthric, language intact. Cranial nerves: CN I: not tested CN II: pupils equal, round and reactive to light, visual fields intact CN III, IV, VI:  full range of motion, no nystagmus, no ptosis CN V:  Endorses decreased left V1-V3 sensation CN VII: upper and lower face symmetric CN VIII: hearing intact CN IX, X: gag intact, uvula midline CN XI: sternocleidomastoid and trapezius muscles intact CN XII: tongue midline Bulk & Tone: normal, no fasciculations. Motor:  5/5 throughout  Sensation: temperature and vibration sensation intact. Deep Tendon Reflexes:  2+ throughout, toes downgoing.  Finger to nose testing:  Without dysmetria.  Heel to shin:  Without dysmetria.  Gait:  Antalgic gait.  Requires use of cane.  Romberg negative.  IMPRESSION: Chronic migraine, possibly worse due to stress Subjective left facial numbness.  I have no explanation for this. Morbid obesity  PLAN: 1.  Start topiramate 50mg  at bedtime.  Call in 4 weeks with update and we can adjust dose if needed. 2.  Take Maxalt 10mg  at earliest onset of  headache.  May repeat dose once in 2 hours if needed.  Do not exceed two tablets in 24 hours.      Take Zofran ODT 4mg  as needed for nausea. 3.  Limit use of pain relievers to no more than 2 days out of the week.  These medications include acetaminophen, ibuprofen, triptans and narcotics.  This will help reduce risk of rebound headaches. 4.  Be aware of common food triggers such as processed sweets, processed foods with nitrites (such as deli meat, hot dogs, sausages), foods with MSG, alcohol (such as wine), chocolate, certain cheeses, certain fruits (dried fruits, some citrus fruit), vinegar, diet soda. 4.  Avoid caffeine 5.  Routine exercise 6.  Proper sleep hygiene 7.  Stay adequately hydrated with water 8.  Keep a headache diary. 9.  Maintain proper stress management. 10.  Do not skip meals.  Work on weight loss 11.  Consider supplements:  Magnesium citrate 400mg  to 600mg  daily, riboflavin 400mg , Coenzyme Q 10 100mg  three times daily 12.  As she has had increased frequency of headaches and also endorses left facial numbness, we will get MRI of brain with and without contrast to evaluate for a specific etiology  Thank you for allowing me to take part in the care of this patient.  Metta Clines, DO  CC: Boykin Nearing, MD

## 2016-07-09 NOTE — Patient Instructions (Signed)
Migraine Recommendations: 1.  Start topiramate 50mg  at bedtime.  Call in 4 weeks with update and we can adjust dose if needed. 2.  Take Maxalt 10mg  at earliest onset of headache.  May repeat dose once in 2 hours if needed.  Do not exceed two tablets in 24 hours.      Take Zofran ODT 4mg  as needed for nausea. 3.  Limit use of pain relievers to no more than 2 days out of the week.  These medications include acetaminophen, ibuprofen, triptans and narcotics.  This will help reduce risk of rebound headaches. 4.  Be aware of common food triggers such as processed sweets, processed foods with nitrites (such as deli meat, hot dogs, sausages), foods with MSG, alcohol (such as wine), chocolate, certain cheeses, certain fruits (dried fruits, some citrus fruit), vinegar, diet soda. 4.  Avoid caffeine 5.  Routine exercise 6.  Proper sleep hygiene 7.  Stay adequately hydrated with water 8.  Keep a headache diary. 9.  Maintain proper stress management. 10.  Do not skip meals. 11.  Consider supplements:  Magnesium citrate 400mg  to 600mg  daily, riboflavin 400mg , Coenzyme Q 10 100mg  three times daily 12.  Will check MRI of brain with and without contrast 13.  Follow up in 3 months.

## 2016-07-26 ENCOUNTER — Telehealth: Payer: Self-pay | Admitting: Family Medicine

## 2016-07-26 NOTE — Telephone Encounter (Signed)
Will route to PCP 

## 2016-07-26 NOTE — Telephone Encounter (Signed)
Letter written  Please fax to # provided and inform patient

## 2016-07-26 NOTE — Telephone Encounter (Signed)
Pt calling stating she just received notice from her apartment complex that [place] will be going to each apartment and spraying for pests on January 29th.  Pt is requesting PCP to write a letter to be faxed to apartment complex stating that pt has asthma and that it is in her best health interests for the pest management team to not spray in her apartment   Pt's apartment complex is [facility] and said letter can be addressed to [place] at the fax number of [phone]   Pt was made aware that paperwork typically takes 7-14 business days and that letter was not guaranteed to be completed by Jan 29th

## 2016-07-29 NOTE — Telephone Encounter (Signed)
Pt was called and informed of letter being faxed over to her apartment complex.

## 2016-07-31 ENCOUNTER — Other Ambulatory Visit: Payer: Self-pay | Admitting: Family Medicine

## 2016-09-02 ENCOUNTER — Other Ambulatory Visit: Payer: Self-pay | Admitting: Family Medicine

## 2016-09-03 ENCOUNTER — Other Ambulatory Visit: Payer: Self-pay | Admitting: Family Medicine

## 2016-09-03 DIAGNOSIS — Z1231 Encounter for screening mammogram for malignant neoplasm of breast: Secondary | ICD-10-CM

## 2016-09-30 ENCOUNTER — Other Ambulatory Visit: Payer: Self-pay | Admitting: Family Medicine

## 2016-09-30 ENCOUNTER — Other Ambulatory Visit: Payer: Self-pay | Admitting: Neurology

## 2016-09-30 DIAGNOSIS — Z9889 Other specified postprocedural states: Secondary | ICD-10-CM

## 2016-09-30 DIAGNOSIS — E890 Postprocedural hypothyroidism: Secondary | ICD-10-CM

## 2016-10-10 ENCOUNTER — Ambulatory Visit: Payer: Medicaid Other | Admitting: Neurology

## 2016-10-14 ENCOUNTER — Encounter: Payer: Self-pay | Admitting: Family Medicine

## 2016-10-14 ENCOUNTER — Ambulatory Visit: Payer: Medicaid Other | Attending: Family Medicine | Admitting: Family Medicine

## 2016-10-14 VITALS — BP 139/81 | HR 73 | Temp 98.1°F | Ht 70.0 in | Wt 326.0 lb

## 2016-10-14 DIAGNOSIS — I10 Essential (primary) hypertension: Secondary | ICD-10-CM | POA: Insufficient documentation

## 2016-10-14 DIAGNOSIS — Z86718 Personal history of other venous thrombosis and embolism: Secondary | ICD-10-CM | POA: Insufficient documentation

## 2016-10-14 DIAGNOSIS — M25512 Pain in left shoulder: Secondary | ICD-10-CM | POA: Insufficient documentation

## 2016-10-14 DIAGNOSIS — E118 Type 2 diabetes mellitus with unspecified complications: Secondary | ICD-10-CM | POA: Diagnosis present

## 2016-10-14 DIAGNOSIS — J4550 Severe persistent asthma, uncomplicated: Secondary | ICD-10-CM | POA: Insufficient documentation

## 2016-10-14 DIAGNOSIS — Z7901 Long term (current) use of anticoagulants: Secondary | ICD-10-CM | POA: Insufficient documentation

## 2016-10-14 DIAGNOSIS — G8929 Other chronic pain: Secondary | ICD-10-CM | POA: Diagnosis not present

## 2016-10-14 DIAGNOSIS — M79642 Pain in left hand: Secondary | ICD-10-CM | POA: Insufficient documentation

## 2016-10-14 DIAGNOSIS — Z87891 Personal history of nicotine dependence: Secondary | ICD-10-CM | POA: Insufficient documentation

## 2016-10-14 DIAGNOSIS — M545 Low back pain: Secondary | ICD-10-CM | POA: Diagnosis not present

## 2016-10-14 DIAGNOSIS — E669 Obesity, unspecified: Secondary | ICD-10-CM | POA: Insufficient documentation

## 2016-10-14 DIAGNOSIS — Z79899 Other long term (current) drug therapy: Secondary | ICD-10-CM | POA: Diagnosis not present

## 2016-10-14 DIAGNOSIS — Z794 Long term (current) use of insulin: Secondary | ICD-10-CM | POA: Insufficient documentation

## 2016-10-14 LAB — POCT GLYCOSYLATED HEMOGLOBIN (HGB A1C): HEMOGLOBIN A1C: 5.7

## 2016-10-14 LAB — GLUCOSE, POCT (MANUAL RESULT ENTRY): POC Glucose: 93 mg/dl (ref 70–99)

## 2016-10-14 MED ORDER — TRAMADOL HCL 50 MG PO TABS: 50.0000 mg | ORAL_TABLET | Freq: Three times a day (TID) | ORAL | 2 refills | Status: DC | PRN

## 2016-10-14 MED ORDER — DICLOFENAC SODIUM 1 % TD GEL: 4.0000 g | Freq: Four times a day (QID) | TRANSDERMAL | 5 refills | Status: DC

## 2016-10-14 MED ORDER — MONTELUKAST SODIUM 10 MG PO TABS: 10.0000 mg | ORAL_TABLET | Freq: Every day | ORAL | 3 refills | Status: DC

## 2016-10-14 MED ORDER — HYDROCHLOROTHIAZIDE 12.5 MG PO CAPS: 12.5000 mg | ORAL_CAPSULE | Freq: Every day | ORAL | 2 refills | Status: DC

## 2016-10-14 NOTE — Progress Notes (Signed)
Subjective:  Patient ID: Melinda [name], female    DOB: July 27, 1965  Age: 51 y.o. MRN: [id number]  CC: Diabetes   HPI Melinda [name] has hx of obesity, DVT, PE, well controlled diabetes, asthma she  presents for   1. CHRONIC DIABETES: diet controlled. Reports decreased sensation in feet. No vision changes, polyuria or polydipsia.   2. Left shoulder pain: x 2 weeks.  Pain in shoulder, upper arm mostly. Also pain in L hand. Feeling like tension and soreness.Worse with sleeping and lying on L side. Improved with massage and time. No previous injury.  There is mild pain in her L lateral neck. There is also associated decreased grip strength in her L hand.  There is no associated chest pain, shortness of breath, nausea or emesis. She takes tramadol at night, but pain "breakthrough". Her current pain level is 6/10. Max pain is 8-9/10.   3. Left leg swelling: on and off for years. She has history of intermittent HTN. She reports that her BP was 160/80 at psychiatrist office on 10/11/16. She denies chest pains or shortness of breath. She weats compression stockings. Reports low salt diet.   4. Asthma: has cough improved with albuterol. No CP or SOB. She is taking flonase. Not taking zyrtec or benadryl due to concern for interaction with elavil, sedation.   Social History  Substance Use Topics  . Smoking status: Former Smoker    Packs/day: 0.50    Years: 40.00    Types: Cigarettes    Quit date: 11/03/2014  . Smokeless tobacco: Never Used  . Alcohol use No    Outpatient Medications Prior to Visit  Medication Sig Dispense Refill  . ACCU-CHEK AVIVA PLUS test strip USE TO TEST BLOOD GLUCOSE FOUR TIMES DAILY BEFORE MEALS AND AT BEDTIME 100 each 12  . acetaminophen (TYLENOL) 500 MG tablet Take 500-1,000 mg by mouth every 6 (six) hours as needed for moderate pain (patient takes 1-2 depending on pain level).     Marland Kitchen albuterol (PROVENTIL) (2.5 MG/3ML) 0.083% nebulizer solution Take 3 mLs (2.5 mg total) by  nebulization every 6 (six) hours as needed for wheezing or shortness of breath. 150 mL 1  . apixaban (ELIQUIS) 5 MG TABS tablet 10 mg twice daily for one week, followed by 5 mg twice daily (Patient not taking: Reported on 07/09/2016) 60 tablet 2  . atorvastatin (LIPITOR) 20 MG tablet Take 1 tablet (20 mg total) by mouth daily. 90 tablet 3  . buPROPion (WELLBUTRIN XL) 150 MG 24 hr tablet Take 150 mg by mouth daily.    . Cetirizine HCl 10 MG CAPS Take 1 capsule (10 mg total) by mouth daily. 30 capsule 5  . diphenhydrAMINE (BENADRYL) 25 MG tablet Take 50 mg by mouth at bedtime as needed for sleep.     . Elastic Bandages & Supports (ANKLE BRACE ADJUST-TO-FIT) MISC 1 each by Does not apply route daily. 1 each 0  . ELIQUIS 5 MG TABS tablet TAKE ONE (1) TABLET BY MOUTH TWO (2) TIMES DAILY 60 tablet 11  . ferrous sulfate 325 (65 FE) MG tablet TAKE 1 TABLET BY MOUTH 2 TIMES DAILY WITH A MEAL 60 tablet 11  . fluticasone (CUTIVATE) 0.05 % cream Apply topically 2 (two) times daily. 30 g 2  . fluticasone (FLONASE) 50 MCG/ACT nasal spray Place 2 sprays into both nostrils daily. 16 g 6  . Fluticasone-Salmeterol (ADVAIR DISKUS) 100-50 MCG/DOSE AEPB Inhale 1 puff into the lungs 2 (two) times daily. 60 each 5  .  gabapentin (NEURONTIN) 100 MG capsule TAKE 1 CAPSULE BY MOUTH THREE TIMES A DAY 90 capsule 2  . insulin lispro (HUMALOG) 100 UNIT/ML injection Inject 6 Units into the skin 3 (three) times daily with meals. 8 U at beginning of meal in V-Go 30, up to 100 units/day     . levothyroxine (SYNTHROID, LEVOTHROID) 200 MCG tablet Take 200 mcg by mouth daily before breakfast.    . metFORMIN (GLUCOPHAGE) 850 MG tablet Take 1 tablet (850 mg total) by mouth 2 (two) times daily with a meal. 180 tablet 3  . methocarbamol (ROBAXIN) 500 MG tablet Take 1 tablet (500 mg total) by mouth 4 (four) times daily. 60 tablet 2  . metoprolol (LOPRESSOR) 50 MG tablet TAKE ONE (1) TABLET BY MOUTH TWO (2) TIMES DAILY 60 tablet 3  .  ondansetron (ZOFRAN-ODT) 4 MG disintegrating tablet TAKE ONE TABLET BY MOUTH EVERY EIGHT HOURS AS NEEDED FOR NAUSEA OR VOMITING 20 tablet 0  . prazosin (MINIPRESS) 1 MG capsule Take 1 mg by mouth at bedtime.    Marland Kitchen PROAIR HFA 108 (90 Base) MCG/ACT inhaler INHALE 2 PUFFS INTO THE LUNGS EVERY 6 HOURS AS NEEDED FOR WHEEZING ORSHORTNESS OF BREATH 8.5 g 0  . rizatriptan (MAXALT) 10 MG tablet Take 1 tablet earliest onset of headache.  [name] repeat once after 2 hours if needed 10 tablet 3  . SUMAtriptan (IMITREX) 50 MG tablet Take 1 tablet (50 mg total) by mouth every 2 (two) hours as needed for migraine or headache. [name] repeat in 2 hours if headache persists or recurs. Do not exceed two doses in 7 day period. 10 tablet 2  . SYNTHROID 25 MCG tablet TAKE ONE TABLET BY MOUTH DAILY WITH 200MCG SYNTHROID TO EQUAL 225MCG DAILY. TAKE BEFORE BREAKFAST 30 tablet 0  . topiramate (TOPAMAX) 50 MG tablet Take 1 tablet (50 mg total) by mouth at bedtime. 30 tablet 3  . traMADol (ULTRAM) 50 MG tablet Take 1 tablet (50 mg total) by mouth every 8 (eight) hours as needed. 60 tablet 2   No facility-administered medications prior to visit.     ROS Review of Systems  Constitutional: Negative for chills and fever.  Eyes: Negative for visual disturbance.  Respiratory: Positive for cough. Negative for shortness of breath.   Cardiovascular: Positive for leg swelling.  Gastrointestinal: Negative for abdominal pain and blood in stool.  Musculoskeletal: Positive for arthralgias (shoulders, hips, ankles ) and back pain.  Allergic/Immunologic: Negative for immunocompromised state.  Neurological: Positive for headaches.  Hematological: Negative for adenopathy. Does not bruise/bleed easily.  Psychiatric/Behavioral: Negative for dysphoric mood and suicidal ideas.    Objective:  BP 139/81   Pulse 73   Temp 98.1 F (36.7 C) (Oral)   Ht 5\' 10"  (1.778 m)   Wt (!) 326 lb (147.9 kg)   LMP 09/30/2016   SpO2 99%   BMI 46.78 kg/m    BP/Weight 10/14/2016 07/09/2016 88/41/6606  Systolic BP 301 601 093  Diastolic BP 81 76 82  Wt. (Lbs) 326 324.7 319.4  BMI 46.78 46.59 45.83    Physical Exam  Constitutional: She is oriented to person, place, and time. She appears well-developed and well-nourished. No distress.  Obese   HENT:  Head: Normocephalic and atraumatic.  Cardiovascular: Normal rate, regular rhythm, normal heart sounds and intact distal pulses.   Pulmonary/Chest: Effort normal and breath sounds normal.  Musculoskeletal: She exhibits no edema.       Left shoulder: She exhibits decreased range of motion and pain.  She exhibits no tenderness, no bony tenderness, no swelling, no effusion, no crepitus, no deformity, no laceration, no spasm, normal pulse and normal strength.  Neurological: She is alert and oriented to person, place, and time.  Skin: Skin is warm and dry. No rash noted.  Psychiatric: She has a normal mood and affect.    Lab Results  Component Value Date   HGBA1C 5.5 04/22/2016   Lab Results  Component Value Date   HGBA1C 5.5 04/22/2016    CBG 93  Assessment & Plan:  Melinda [name] was seen today for diabetes.  Diagnoses and all orders for this visit:  Controlled type 2 diabetes mellitus with complication, with long-term current use of insulin (HCC) -     Glucose (CBG) -     HgB A1c -     Microalbumin/Creatinine Ratio, Urine  Chronic bilateral low back pain without sciatica -     traMADol (ULTRAM) 50 MG tablet; Take 1 tablet (50 mg total) by mouth every 8 (eight) hours as needed.  Severe persistent asthma without complication -     montelukast (SINGULAIR) 10 MG tablet; Take 1 tablet (10 mg total) by mouth at bedtime.  Acute pain of left shoulder -     diclofenac sodium (VOLTAREN) 1 % GEL; Apply 4 g topically 4 (four) times daily. To neck and left shoulder  Hypertension, unspecified type -     hydrochlorothiazide (MICROZIDE) 12.5 MG capsule; Take 1 capsule (12.5 mg total) by mouth  daily.   There are no diagnoses linked to this encounter.  No orders of the defined types were placed in this encounter.   Follow-up: Return in about 6 weeks (around 11/25/2016) for left shoulder pain .   Boykin Nearing MD

## 2016-10-14 NOTE — Assessment & Plan Note (Signed)
A: chronic, no acute exacerbation P: Add Singulair for cough

## 2016-10-14 NOTE — Assessment & Plan Note (Signed)
Acute pain, no injury Suspect referred pain from neck due to MSK tension voltaren gel ordered

## 2016-10-14 NOTE — Patient Instructions (Addendum)
Colisha was seen today for diabetes.  Diagnoses and all orders for this visit:  Controlled type 2 diabetes mellitus with complication, with long-term current use of insulin (HCC) -     Glucose (CBG) -     HgB A1c -     Microalbumin/Creatinine Ratio, Urine  Chronic bilateral low back pain without sciatica -     traMADol (ULTRAM) 50 MG tablet; Take 1 tablet (50 mg total) by mouth every 8 (eight) hours as needed.  Severe persistent asthma without complication -     montelukast (SINGULAIR) 10 MG tablet; Take 1 tablet (10 mg total) by mouth at bedtime.  Acute pain of left shoulder -     diclofenac sodium (VOLTAREN) 1 % GEL; Apply 4 g topically 4 (four) times daily. To neck and left shoulder  Hypertension, unspecified type -     hydrochlorothiazide (MICROZIDE) 12.5 MG capsule; Take 1 capsule (12.5 mg total) by mouth daily.   f/u in 6 weeks for BP check, L shoulder pain   Dr. Adrian Blackwater

## 2016-10-14 NOTE — Assessment & Plan Note (Signed)
Elevated BP 3 days ago, L leg swelling  Hx of HTN  Add HCTZ 12.5 mg daily

## 2016-10-14 NOTE — Assessment & Plan Note (Signed)
Remains controlled with diet

## 2016-10-15 LAB — MICROALBUMIN / CREATININE URINE RATIO
Creatinine, Urine: 120.5 mg/dL
Microalb/Creat Ratio: <2.5 mg/g creat (ref 0.0–30.0)
Microalbumin, Urine: <3 ug/mL

## 2016-10-21 ENCOUNTER — Ambulatory Visit
Admission: RE | Admit: 2016-10-21 | Discharge: 2016-10-21 | Disposition: A | Discharge status: Home / Self Care | Payer: Medicaid Other | Source: Ambulatory Visit | Attending: Family Medicine | Admitting: Family Medicine

## 2016-10-21 ENCOUNTER — Other Ambulatory Visit: Payer: Self-pay | Admitting: Family Medicine

## 2016-10-21 DIAGNOSIS — Z1231 Encounter for screening mammogram for malignant neoplasm of breast: Secondary | ICD-10-CM

## 2016-10-21 HISTORY — DX: Malignant (primary) neoplasm, unspecified: C80.1

## 2016-10-22 ENCOUNTER — Other Ambulatory Visit: Payer: Self-pay | Admitting: Neurology

## 2016-10-22 ENCOUNTER — Other Ambulatory Visit: Payer: Self-pay | Admitting: Family Medicine

## 2016-10-22 DIAGNOSIS — E890 Postprocedural hypothyroidism: Secondary | ICD-10-CM

## 2016-10-22 DIAGNOSIS — Z9889 Other specified postprocedural states: Secondary | ICD-10-CM

## 2016-10-22 NOTE — Telephone Encounter (Signed)
Rx sent 

## 2016-10-23 ENCOUNTER — Telehealth: Payer: Self-pay

## 2016-10-23 NOTE — Telephone Encounter (Signed)
Pt was called and informed of lab results. 

## 2016-10-25 ENCOUNTER — Other Ambulatory Visit: Payer: Self-pay | Admitting: Neurology

## 2016-10-25 ENCOUNTER — Other Ambulatory Visit: Payer: Self-pay | Admitting: Family Medicine

## 2016-10-25 DIAGNOSIS — G8929 Other chronic pain: Secondary | ICD-10-CM

## 2016-10-25 DIAGNOSIS — M545 Low back pain, unspecified: Secondary | ICD-10-CM

## 2016-10-25 DIAGNOSIS — E890 Postprocedural hypothyroidism: Secondary | ICD-10-CM

## 2016-10-25 DIAGNOSIS — Z9889 Other specified postprocedural states: Secondary | ICD-10-CM

## 2016-10-30 ENCOUNTER — Ambulatory Visit (INDEPENDENT_AMBULATORY_CARE_PROVIDER_SITE_OTHER): Payer: Medicaid Other | Admitting: Neurology

## 2016-10-30 ENCOUNTER — Encounter: Payer: Self-pay | Admitting: Neurology

## 2016-10-30 VITALS — BP 132/88 | HR 113 | Temp 98.3°F | Ht 70.0 in | Wt 316.0 lb

## 2016-10-30 DIAGNOSIS — G43709 Chronic migraine without aura, not intractable, without status migrainosus: Secondary | ICD-10-CM

## 2016-10-30 DIAGNOSIS — IMO0002 Reserved for concepts with insufficient information to code with codable children: Secondary | ICD-10-CM

## 2016-10-30 MED ORDER — TOPIRAMATE 25 MG PO TABS: 75.0000 mg | ORAL_TABLET | Freq: Every day | ORAL | 4 refills | Status: DC

## 2016-10-30 NOTE — Progress Notes (Signed)
NEUROLOGY FOLLOW UP OFFICE NOTE  Melinda [name] [id number]  HISTORY OF PRESENT ILLNESS: Melinda [name] is a 51 year old right-handed female with type 2 diabetes, asthma, iron deficiency anemia, chronic low back pain, PE/DVT, PTSD, and history of thyroid cancer status post thyroidectomy who follows up for migraine.  UPDATE: Intensity:  8.5/10 Duration:  45 minutes with Maxalt (reduces intensity to manageable 4/10 for another couple of hours) Frequency:  5 to 10 days per month in a cluster during her cycle Frequency of abortive medication: as needed. Current NSAIDS:  Contraindicated (on anticoagulation) Current analgesics:  acetaminophen, tramadol (daily Current triptans:  Maxalt 10mg  Current anti-emetic: Zofran ODT 4mg  Current muscle relaxants: methocarbamol Current anti-anxiolytic:  no Current sleep aide:  no Current Antihypertensive medications:  metoprolol 50mg  twice daily, HCTZ Current Antidepressant medications:  bupropion 150mg  Current Anticonvulsant medications:  topiramate 50mg , gabapentin 100mg  three times daily Current Vitamins/Herbal/Supplements:  ferrous sulfate Current Antihistamines/Decongestants:  Flonase Other therapy:  Sees psychologist and psychiatrist, which helps.   Caffeine:  no Alcohol:  no Smoker:  no Diet:  Hydrates.  No soda.  Eats baked and broiled chicken.  No fried foods Exercise:  Stationary bike (limited due to chronic pain) Depression/anxiety:  Improved since seeing psychiatrist and psychologist.  HISTORY:  Onset:  For over 20 years but became daily 4 months. Location:  Right sided Quality:  squeezing Initial Intensity:  8.5/10 (sometimes 10/10) Aura:  Sometimes preceded with visual aura (sees a pinwheel) Prodrome:  no Associated symptoms:  Nausea, photophobia, phonophobia.  No worse headache of life, change in quality, wakes her up from sleep. Initial Duration:  3 hours to all day Initial Frequency:  Prior to 4 months ago, they occurred once a  month.  Now they are daily Triggers/exacerbating factors:  Apples, aged cheese, menstrual cycle Relieving factors:  no Activity:  Light activity aggravates   Past NSAIDS:  ibuprofen Past analgesics:  Excedrin Past abortive triptans:  Sumatriptan tablet/NS/[place] effective in past but 50mg  tablet no longer effective for this daily headache. Past muscle relaxants:  no Past anti-emetic:  no Past antihypertensive medications:  no Past antidepressant medications:  no Past anticonvulsant medications:  no Past vitamins/Herbal/Supplements:  no Past antihistamines/decongestants:  no Other past therapies:  no   Family history of headache:  Son has migraines.   CT of head from 01/16/15 was unremarkable.  PAST MEDICAL HISTORY: Past Medical History:  Diagnosis Date  . ADHD (attention deficit hyperactivity disorder)   . Arthritis   . Asthma   . Cancer ([place])   . Chicken pox AGE 77  . Diabetes mellitus without complication ([place])   . GERD (gastroesophageal reflux disease)   . Glaucoma    BOTH EYES, NO EYE DROPS  . History of blood transfusion MARCH 2016    2UNITS GIVEN AND IRON GIVEN  . Hyperglycemia 09/09/2014  . Hypertension   . 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 Iron deficiency anemia due to chronic blood loss 09/09/2014  . Low TSH level 09/08/2014  . Lumbar herniated disc   . Menorrhagia 09/08/2014  . Migraine    CLUSTER AND MIGRAINES  . Multiple thyroid nodules   . Ovarian mass, right 09/09/2014  . PE (pulmonary embolism) 2016  . Peripheral neuropathy    FINGER TIPS AND TOES NUMB SOME  . Preeclampsia  1983   WITH PREGNANCY  . PTSD (post-traumatic stress disorder)   . Scoliosis   .  Seizures ([place]) AGE 67   NONE SINCE, HAD CHICKEN POX THEN    MEDICATIONS: Current Outpatient Prescriptions on File Prior to Visit  Medication Sig Dispense Refill  . ACCU-CHEK AVIVA PLUS test strip USE TO TEST BLOOD GLUCOSE FOUR  TIMES DAILY BEFORE MEALS AND AT BEDTIME 100 each 12  . albuterol (PROVENTIL) (2.5 MG/3ML) 0.083% nebulizer solution Take 3 mLs (2.5 mg total) by nebulization every 6 (six) hours as needed for wheezing or shortness of breath. 150 mL 1  . amitriptyline (ELAVIL) 50 MG tablet Take 50 mg by mouth at bedtime.    Marland Kitchen apixaban (ELIQUIS) 5 MG TABS tablet 10 mg twice daily for one week, followed by 5 mg twice daily 60 tablet 2  . atorvastatin (LIPITOR) 20 MG tablet Take 1 tablet (20 mg total) by mouth daily. 90 tablet 3  . buPROPion (WELLBUTRIN XL) 150 MG 24 hr tablet Take 150 mg by mouth daily.    . citalopram (CELEXA) 10 MG tablet Take 10 mg by mouth daily.    . diclofenac sodium (VOLTAREN) 1 % GEL Apply 4 g topically 4 (four) times daily. To neck and left shoulder 100 g 5  . ELIQUIS 5 MG TABS tablet TAKE ONE (1) TABLET BY MOUTH TWO (2) TIMES DAILY 60 tablet 11  . ferrous sulfate 325 (65 FE) MG tablet TAKE 1 TABLET BY MOUTH 2 TIMES DAILY WITH A MEAL 60 tablet 11  . fluticasone (CUTIVATE) 0.05 % cream Apply topically 2 (two) times daily. 30 g 2  . fluticasone (FLONASE) 50 MCG/ACT nasal spray Place 2 sprays into both nostrils daily. 16 g 6  . Fluticasone-Salmeterol (ADVAIR DISKUS) 100-50 MCG/DOSE AEPB Inhale 1 puff into the lungs 2 (two) times daily. 60 each 5  . gabapentin (NEURONTIN) 100 MG capsule TAKE 1 CAPSULE BY MOUTH THREE TIMES A DAY 90 capsule 2  . hydrochlorothiazide (MICROZIDE) 12.5 MG capsule Take 1 capsule (12.5 mg total) by mouth daily. 30 capsule 2  . insulin lispro (HUMALOG) 100 UNIT/ML injection Inject 6 Units into the skin 3 (three) times daily with meals. 8 U at beginning of meal in V-Go 30, up to 100 units/day     . levothyroxine (SYNTHROID, LEVOTHROID) 200 MCG tablet Take 200 mcg by mouth daily before breakfast.    . metFORMIN (GLUCOPHAGE) 850 MG tablet Take 1 tablet (850 mg total) by mouth 2 (two) times daily with a meal. 180 tablet 3  . methocarbamol (ROBAXIN) 500 MG tablet Take 1 tablet  (500 mg total) by mouth 4 (four) times daily. 60 tablet 2  . metoprolol (LOPRESSOR) 50 MG tablet TAKE ONE (1) TABLET BY MOUTH TWO (2) TIMES DAILY 60 tablet 3  . montelukast (SINGULAIR) 10 MG tablet Take 1 tablet (10 mg total) by mouth at bedtime. 30 tablet 3  . ondansetron (ZOFRAN-ODT) 4 MG disintegrating tablet TAKE ONE TABLET BY MOUTH EVERY EIGHT HOURS AS NEEDED FOR NAUSEA OR VOMITING 20 tablet 0  . prazosin (MINIPRESS) 1 MG capsule Take 1 mg by mouth at bedtime.    Marland Kitchen PROAIR HFA 108 (90 Base) MCG/ACT inhaler INHALE 2 PUFFS INTO THE LUNGS EVERY 6 HOURS AS NEEDED FOR WHEEZING ORSHORTNESS OF BREATH 8.5 g 0  . rizatriptan (MAXALT) 10 MG tablet Take 1 tablet earliest onset of headache.  May repeat once after 2 hours if needed 10 tablet 3  . SYNTHROID 25 MCG tablet TAKE ONE TABLET BY MOUTH DAILY WITH 200MCG SYNTHROID TO EQUAL 225MCG DAILY. TAKE BEFORE BREAKFAST 30 tablet 0  .  traMADol (ULTRAM) 50 MG tablet Take 1 tablet (50 mg total) by mouth every 8 (eight) hours as needed. 60 tablet 2   No current facility-administered medications on file prior to visit.     ALLERGIES: Allergies  Allergen Reactions  . Ceftriaxone Anaphylaxis    ROCEPHIN  . Penicillins Shortness Of Breath    Has patient had a PCN reaction causing immediate rash, facial/tongue/throat swelling, SOB or lightheadedness with hypotension: Yes Has patient had a PCN reaction causing severe rash involving mucus membranes or skin necrosis: No Has patient had a PCN reaction that required hospitalization No Has patient had a PCN reaction occurring within the last 10 years: No If all of the above answers are "NO", then may proceed with Cephalosporin use.   . Shellfish Allergy Anaphylaxis  . Gold-Containing Drug Products     HANDS ITCH  . Nickel     HANDS SWELL  . Citrus Rash    FAMILY HISTORY: Family History  Problem Relation Age of Onset  . Diabetes Mother   . Breast cancer Mother   . CAD Mother   . Hypertension Mother   .  Alcohol abuse Father   . Hypertension Father     SOCIAL HISTORY: Social History   Social History  . Marital status: Single    Spouse name: N/A  . Number of children: N/A  . Years of education: N/A   Occupational History  . Not on file.   Social History Main Topics  . Smoking status: Former Smoker    Packs/day: 0.50    Years: 40.00    Types: Cigarettes    Quit date: 11/03/2014  . Smokeless tobacco: Never Used  . Alcohol use No  . Drug use: No  . Sexual activity: No   Other Topics Concern  . Not on file   Social History Narrative  . No narrative on file    REVIEW OF SYSTEMS: Constitutional: No fevers, chills, or sweats, no generalized fatigue, change in appetite Eyes: No visual changes, double vision, eye pain Ear, nose and throat: No hearing loss, ear pain, nasal congestion, sore throat Cardiovascular: No chest pain, palpitations Respiratory:  No shortness of breath at rest or with exertion, wheezes GastrointestinaI: No nausea, vomiting, diarrhea, abdominal pain, fecal incontinence Genitourinary:  No dysuria, urinary retention or frequency Musculoskeletal:  No neck pain, back pain Integumentary: No rash, pruritus, skin lesions Neurological: as above Psychiatric: No depression, insomnia, anxiety Endocrine: No palpitations, fatigue, diaphoresis, mood swings, change in appetite, change in weight, increased thirst Hematologic/Lymphatic:  No purpura, petechiae. Allergic/Immunologic: no itchy/runny eyes, nasal congestion, recent allergic reactions, rashes  PHYSICAL EXAM: Vitals:   10/30/16 0850  BP: 132/88  Pulse: (!) 113  Temp: 98.3 F (36.8 C)   General: No acute distress.  Patient appears well-groomed.  Morbidly obese body habitus. Head:  Normocephalic/atraumatic Eyes:  Fundi examined but not visualized Neck: supple, no paraspinal tenderness, full range of motion Heart:  Regular rate and rhythm Lungs:  Clear to auscultation bilaterally Back: No paraspinal  tenderness Neurological Exam: alert and oriented to person, place, and time. Attention span and concentration intact, recent and remote memory intact, fund of knowledge intact.  Speech fluent and not dysarthric, language intact.  CN II-XII intact. Bulk and tone normal, muscle strength 5/5 throughout.  Sensation to light touch  intact.  Deep tendon reflexes 2+ throughout.  Finger to nose testing intact.  Gait normal, Romberg negative.  IMPRESSION: Migraine Morbid obesity   PLAN: 1.  Increase topiramate to  75mg  at bedtime 2.  Continue Maxalt as needed 3.  Continue lifestyle modification:  Hydration, exercise, sleep hygiene, weight loss/diet, treatment of depression/anxiety 4.  Follow up in 5 months or as needed.  Metta Clines, DO  CC: Boykin Nearing, MD

## 2016-10-30 NOTE — Patient Instructions (Signed)
Migraine Recommendations: 1.  We will increase topiramate to 75mg  at bedtime.  Call in 4 weeks with update and we can adjust dose if needed. 2.  Take rizatriptan 10mg  at earliest onset of headache.  May repeat dose once in 2 hours if needed.  Do not exceed two tablets in 24 hours. 3.  Limit use of pain relievers to no more than 2 days out of the week.  These medications include acetaminophen, ibuprofen, triptans and narcotics.  This will help reduce risk of rebound headaches. 4.  Be aware of common food triggers such as processed sweets, processed foods with nitrites (such as deli meat, hot dogs, sausages), foods with MSG, alcohol (such as wine), chocolate, certain cheeses, certain fruits (dried fruits, some citrus fruit), vinegar, diet soda. 4.  Avoid caffeine 5.  Routine exercise 6.  Proper sleep hygiene 7.  Stay adequately hydrated with water 8.  Keep a headache diary. 9.  Maintain proper stress management. 10.  Do not skip meals.  Continue working on weight loss 11.  Consider supplements:  Magnesium citrate 400mg  to 600mg  daily, riboflavin 400mg , Coenzyme Q 10 100mg  three times daily 12.  Follow up in 5 months.

## 2016-11-01 MED ORDER — LEVOTHYROXINE SODIUM 200 MCG PO TABS: ORAL_TABLET | ORAL | 2 refills | Status: DC

## 2016-11-01 MED ORDER — SYNTHROID 25 MCG PO TABS: ORAL_TABLET | ORAL | 2 refills | Status: DC

## 2016-11-13 ENCOUNTER — Encounter: Payer: Self-pay | Admitting: Family Medicine

## 2016-11-26 ENCOUNTER — Telehealth: Payer: Self-pay | Admitting: Family Medicine

## 2016-11-26 NOTE — Telephone Encounter (Signed)
Caller calling to request Dr Adrian Blackwater to fill out a letter that she is faxing in order for pt to receive replacement dentures that were lost in the hospital. Caller requests Dr Adrian Blackwater to fill out letter and fax back to caller.

## 2016-11-28 ENCOUNTER — Ambulatory Visit: Payer: Medicaid Other | Attending: Family Medicine | Admitting: Family Medicine

## 2016-11-28 ENCOUNTER — Encounter: Payer: Self-pay | Admitting: Family Medicine

## 2016-11-28 VITALS — BP 125/80 | HR 92 | Temp 98.3°F | Wt 313.4 lb

## 2016-11-28 DIAGNOSIS — Z86718 Personal history of other venous thrombosis and embolism: Secondary | ICD-10-CM | POA: Insufficient documentation

## 2016-11-28 DIAGNOSIS — Z85850 Personal history of malignant neoplasm of thyroid: Secondary | ICD-10-CM | POA: Insufficient documentation

## 2016-11-28 DIAGNOSIS — N920 Excessive and frequent menstruation with regular cycle: Secondary | ICD-10-CM | POA: Diagnosis not present

## 2016-11-28 DIAGNOSIS — Z87891 Personal history of nicotine dependence: Secondary | ICD-10-CM | POA: Diagnosis not present

## 2016-11-28 DIAGNOSIS — E118 Type 2 diabetes mellitus with unspecified complications: Secondary | ICD-10-CM | POA: Insufficient documentation

## 2016-11-28 DIAGNOSIS — I10 Essential (primary) hypertension: Secondary | ICD-10-CM | POA: Insufficient documentation

## 2016-11-28 DIAGNOSIS — Z794 Long term (current) use of insulin: Secondary | ICD-10-CM | POA: Diagnosis not present

## 2016-11-28 DIAGNOSIS — M25512 Pain in left shoulder: Secondary | ICD-10-CM | POA: Diagnosis not present

## 2016-11-28 DIAGNOSIS — E669 Obesity, unspecified: Secondary | ICD-10-CM | POA: Insufficient documentation

## 2016-11-28 DIAGNOSIS — Z7901 Long term (current) use of anticoagulants: Secondary | ICD-10-CM | POA: Insufficient documentation

## 2016-11-28 DIAGNOSIS — J45909 Unspecified asthma, uncomplicated: Secondary | ICD-10-CM | POA: Insufficient documentation

## 2016-11-28 DIAGNOSIS — Z6841 Body Mass Index (BMI) 40.0 and over, adult: Secondary | ICD-10-CM | POA: Diagnosis not present

## 2016-11-28 DIAGNOSIS — E890 Postprocedural hypothyroidism: Secondary | ICD-10-CM | POA: Insufficient documentation

## 2016-11-28 DIAGNOSIS — Z79899 Other long term (current) drug therapy: Secondary | ICD-10-CM | POA: Diagnosis not present

## 2016-11-28 LAB — GLUCOSE, POCT (MANUAL RESULT ENTRY): POC Glucose: 118 mg/dl — AB (ref 70–99)

## 2016-11-28 NOTE — Assessment & Plan Note (Signed)
Well controlled diabetes  Continue current plan

## 2016-11-28 NOTE — Assessment & Plan Note (Signed)
Persistent pain concerning for rotator cuff tendonopathy Ortho referral placed

## 2016-11-28 NOTE — Telephone Encounter (Signed)
Patient seen in office today Letter for medicaid override for dentures  will be written and faxed to her dentist

## 2016-11-28 NOTE — Progress Notes (Signed)
Subjective:  Patient ID: Melinda [name], female    DOB: Jun 09, 1966  Age: 51 y.o. MRN: [id number]  CC: Diabetes   HPI Melinda [name] has hx of obesity, DVT, PE, anticoagulated on eliquis, papillary thyroid carcinoma s/p thyroidectomy,  well controlled diabetes, asthma she  presents for   1. CHRONIC DIABETES: diet controlled. Reports decreased sensation in feet. No vision changes, polyuria or polydipsia. She loss her dentures 12 months and reports trouble eating healthy low carb foods.   2. Left shoulder pain: x 2 weeks.  Pain in shoulder, upper arm mostly. Also pain in L hand. Feeling like tension and soreness.Worse with sleeping and lying on L side. Improved with massage and time. No previous injury.  There is mild pain in her L lateral neck. There is also associated decreased grip strength in her L hand.  There is no associated chest pain, shortness of breath, nausea or emesis. She takes tramadol at night, but pain "breakthrough". She added diclofenac gel which offered partial relief of symptoms. She has not been using the gel 4 times per day. Her current pain level is 3/10 at rest. Max pain is 8-9/10 when she raises her L arm. She had a normal screening mammogram one month ago.   Previous imaging CT cervical spine 01/16/15: CT cervical spine: Areas of osteoarthritic change. No fracture or spondylolisthesis.  3. Menorrhagia: she is s/p uterine artery embolization in 08/2015 for abnormal uterine bleeding with anemia. She reports her periods occur every 28 days, last 5-7 days, they are heavy for first 3 days with passing of clots. Her last follow up with gyn was in 08/2015.   Social History  Substance Use Topics  . Smoking status: Former Smoker    Packs/day: 0.50    Years: 40.00    Types: Cigarettes    Quit date: 11/03/2014  . Smokeless tobacco: Never Used  . Alcohol use No    Outpatient Medications Prior to Visit  Medication Sig Dispense Refill  . ACCU-CHEK AVIVA PLUS test strip USE TO TEST  BLOOD GLUCOSE FOUR TIMES DAILY BEFORE MEALS AND AT BEDTIME 100 each 12  . albuterol (PROVENTIL) (2.5 MG/3ML) 0.083% nebulizer solution Take 3 mLs (2.5 mg total) by nebulization every 6 (six) hours as needed for wheezing or shortness of breath. 150 mL 1  . amitriptyline (ELAVIL) 50 MG tablet Take 50 mg by mouth at bedtime.    Marland Kitchen apixaban (ELIQUIS) 5 MG TABS tablet 10 mg twice daily for one week, followed by 5 mg twice daily 60 tablet 2  . atorvastatin (LIPITOR) 20 MG tablet Take 1 tablet (20 mg total) by mouth daily. 90 tablet 3  . buPROPion (WELLBUTRIN XL) 150 MG 24 hr tablet Take 150 mg by mouth daily.    . citalopram (CELEXA) 10 MG tablet Take 10 mg by mouth daily.    . diclofenac sodium (VOLTAREN) 1 % GEL Apply 4 g topically 4 (four) times daily. To neck and left shoulder 100 g 5  . ELIQUIS 5 MG TABS tablet TAKE ONE (1) TABLET BY MOUTH TWO (2) TIMES DAILY 60 tablet 11  . ferrous sulfate 325 (65 FE) MG tablet TAKE 1 TABLET BY MOUTH 2 TIMES DAILY WITH A MEAL 60 tablet 11  . fluticasone (CUTIVATE) 0.05 % cream Apply topically 2 (two) times daily. 30 g 2  . fluticasone (FLONASE) 50 MCG/ACT nasal spray Place 2 sprays into both nostrils daily. 16 g 6  . Fluticasone-Salmeterol (ADVAIR DISKUS) 100-50 MCG/DOSE AEPB Inhale 1 puff into  the lungs 2 (two) times daily. 60 each 5  . gabapentin (NEURONTIN) 100 MG capsule TAKE 1 CAPSULE BY MOUTH THREE TIMES A DAY 90 capsule 2  . hydrochlorothiazide (MICROZIDE) 12.5 MG capsule Take 1 capsule (12.5 mg total) by mouth daily. 30 capsule 2  . insulin lispro (HUMALOG) 100 UNIT/ML injection Inject 6 Units into the skin 3 (three) times daily with meals. 8 U at beginning of meal in V-Go 30, up to 100 units/day     . levothyroxine (SYNTHROID, LEVOTHROID) 200 MCG tablet Take by mouth in the morning  with 25 mcg tablet for total dose of 225 mcg daily 30 tablet 2  . metFORMIN (GLUCOPHAGE) 850 MG tablet Take 1 tablet (850 mg total) by mouth 2 (two) times daily with a meal. 180  tablet 3  . methocarbamol (ROBAXIN) 500 MG tablet Take 1 tablet (500 mg total) by mouth 4 (four) times daily. 60 tablet 2  . methocarbamol (ROBAXIN) 500 MG tablet TAKE ONE (1) TABLET BY MOUTH FOUR (4) TIMES DAILY 60 tablet 2  . metoprolol (LOPRESSOR) 50 MG tablet TAKE ONE (1) TABLET BY MOUTH TWO (2) TIMES DAILY 60 tablet 3  . montelukast (SINGULAIR) 10 MG tablet Take 1 tablet (10 mg total) by mouth at bedtime. 30 tablet 3  . ondansetron (ZOFRAN-ODT) 4 MG disintegrating tablet TAKE ONE TABLET BY MOUTH EVERY EIGHT HOURS AS NEEDED FOR NAUSEA OR VOMITING 20 tablet 0  . prazosin (MINIPRESS) 1 MG capsule Take 1 mg by mouth at bedtime.    Marland Kitchen PROAIR HFA 108 (90 Base) MCG/ACT inhaler INHALE 2 PUFFS INTO THE LUNGS EVERY 6 HOURS AS NEEDED FOR WHEEZING ORSHORTNESS OF BREATH 8.5 g 0  . rizatriptan (MAXALT) 10 MG tablet Take 1 tablet earliest onset of headache.  [name] repeat once after 2 hours if needed 10 tablet 3  . SYNTHROID 25 MCG tablet TAKE ONE TABLET BY MOUTH DAILY WITH 200MCG SYNTHROID TO EQUAL 225MCG DAILY. TAKE BEFORE BREAKFAST 30 tablet 2  . topiramate (TOPAMAX) 25 MG tablet Take 3 tablets (75 mg total) by mouth at bedtime. 90 tablet 4  . traMADol (ULTRAM) 50 MG tablet Take 1 tablet (50 mg total) by mouth every 8 (eight) hours as needed. 60 tablet 2   No facility-administered medications prior to visit.     ROS Review of Systems  Constitutional: Negative for chills and fever.  Eyes: Negative for visual disturbance.  Respiratory: Positive for cough. Negative for shortness of breath.   Cardiovascular: Positive for leg swelling.  Gastrointestinal: Negative for abdominal pain and blood in stool.  Musculoskeletal: Positive for arthralgias (shoulders, hips, ankles ) and back pain.  Allergic/Immunologic: Negative for immunocompromised state.  Neurological: Positive for headaches.  Hematological: Negative for adenopathy. Does not bruise/bleed easily.  Psychiatric/Behavioral: Negative for dysphoric mood  and suicidal ideas.    Objective:  BP 125/80   Pulse 92   Temp 98.3 F (36.8 C) (Oral)   Wt (!) 313 lb 6.4 oz (142.2 kg)   SpO2 97%   BMI 44.97 kg/m   BP/Weight 11/28/2016 10/30/2016 7/37/1062  Systolic BP 694 854 627  Diastolic BP 80 88 81  Wt. (Lbs) 313.4 316 326  BMI 44.97 45.34 46.78    Physical Exam  Constitutional: She is oriented to person, place, and time. She appears well-developed and well-nourished. No distress.  Obese   HENT:  Head: Normocephalic and atraumatic.  Mouth/Throat:    Cardiovascular: Normal rate, regular rhythm, normal heart sounds and intact distal pulses.  Pulmonary/Chest: Effort normal and breath sounds normal.  Musculoskeletal: She exhibits no edema.       Left shoulder: She exhibits decreased range of motion and pain. She exhibits no tenderness, no bony tenderness, no swelling, no effusion, no crepitus, no deformity, no laceration, no spasm, normal pulse and normal strength.  Neurological: She is alert and oriented to person, place, and time.  Skin: Skin is warm and dry. No rash noted.  Psychiatric: She has a normal mood and affect.    Lab Results  Component Value Date   HGBA1C 5.7 10/14/2016   CBG 118  Lab Results  Component Value Date   WBC 11.7 (H) 06/27/2016   HGB 14.3 06/27/2016   HCT 44.2 06/27/2016   MCV 87.7 06/27/2016   PLT 294 06/27/2016     Assessment & Plan:  Melinda [name] was seen today for diabetes.  Diagnoses and all orders for this visit:  Controlled type 2 diabetes mellitus with complication, with long-term current use of insulin ([place]) -     POCT glucose (manual entry)  Menorrhagia with regular cycle -     Ambulatory referral to Gynecology  Acute pain of left shoulder -     Ambulatory referral to Orthopedic Surgery   There are no diagnoses linked to this encounter.  No orders of the defined types were placed in this encounter.   Follow-up: Return in about 3 months (around 02/28/2017) for HTN and diabetes .    Boykin Nearing MD

## 2016-11-28 NOTE — Patient Instructions (Addendum)
Beckett was seen today for diabetes.  Diagnoses and all orders for this visit:  Controlled type 2 diabetes mellitus with complication, with long-term current use of insulin (HCC) -     POCT glucose (manual entry)  Menorrhagia with regular cycle -     Ambulatory referral to Gynecology  Acute pain of left shoulder -     Ambulatory referral to Orthopedic Surgery  I will send a letter to request replacement dentures to your dentist  F/u in 3 months for HTN and diabetes  Dr. Adrian Blackwater

## 2016-12-02 ENCOUNTER — Other Ambulatory Visit: Payer: Self-pay | Admitting: Family Medicine

## 2016-12-02 ENCOUNTER — Encounter: Payer: Self-pay | Admitting: Family Medicine

## 2016-12-02 LAB — HM DIABETES EYE EXAM

## 2016-12-11 ENCOUNTER — Ambulatory Visit (INDEPENDENT_AMBULATORY_CARE_PROVIDER_SITE_OTHER): Payer: Medicaid Other

## 2016-12-11 ENCOUNTER — Encounter: Payer: Self-pay | Admitting: Family Medicine

## 2016-12-11 ENCOUNTER — Ambulatory Visit (INDEPENDENT_AMBULATORY_CARE_PROVIDER_SITE_OTHER): Payer: Medicaid Other | Admitting: Orthopedic Surgery

## 2016-12-11 ENCOUNTER — Encounter (INDEPENDENT_AMBULATORY_CARE_PROVIDER_SITE_OTHER): Payer: Self-pay | Admitting: Orthopedic Surgery

## 2016-12-11 DIAGNOSIS — M5412 Radiculopathy, cervical region: Secondary | ICD-10-CM | POA: Diagnosis not present

## 2016-12-11 DIAGNOSIS — M25512 Pain in left shoulder: Secondary | ICD-10-CM | POA: Diagnosis not present

## 2016-12-11 DIAGNOSIS — G8929 Other chronic pain: Secondary | ICD-10-CM

## 2016-12-14 NOTE — Progress Notes (Signed)
Office Visit Note   Patient: Telly Broberg           Date of Birth: 26-May-1966           MRN: [id number] Visit Date: 12/11/2016 Requested by: Boykin Nearing, MD [address] [place],  [address] PCP: Boykin Nearing, MD  Subjective: Chief Complaint  Patient presents with  . Left Shoulder - Pain  . Neck - Pain    HPI: Sameeha is a patient with left shoulder pain.  Denies any history of injury.  Pain has been going on for months.  She only has pain with certain movements.  She states that the pain will radiate down the arm and she will occasionally wake up with pain.  Has occasional neck pain with some numbness and tingling.  She states that she has diabetes and no longer can feel the finger pricks in her hand.  She states that she reports some left arm weakness.  She currently is not working.  It hurts her to lift her arm and it is hard for her to reach behind her back.  She is not taking much in the way of medication for this problem.              ROS: All systems reviewed are negative as they relate to the chief complaint within the history of present illness.  Patient denies  fevers or chills.   Assessment & Plan: Visit Diagnoses:  1. Chronic left shoulder pain   2. Cervical radiculopathy     Plan: Impression is early left frozen shoulder.  Does not look like cervical spine radiculopathy at this time.  She does have some loss of range of motion at terminal forward flexion and external rotation.  She is diabetic and so I don't really want to do an injection today.  I do think physical therapy at [facility] may help.  She's range of motion and strengthening exercises 3 times a week for 6 weeks.  If that doesn't help and her range of motion worsens in she can come back for repeat assessment.  Follow-Up Instructions: Return in about 8 weeks (around 02/05/2017).   Orders:  Orders Placed This Encounter  Procedures  . XR Cervical Spine 2 or 3 views  . XR Shoulder Left  . Ambulatory  referral to Physical Therapy   No orders of the defined types were placed in this encounter.     Procedures: No procedures performed   Clinical Data: No additional findings.  Objective: Vital Signs: There were no vitals taken for this visit.  Physical Exam:   Constitutional: Patient appears well-developed HEENT:  Head: Normocephalic Eyes:EOM are normal Neck: Normal range of motion Cardiovascular: Normal rate Pulmonary/chest: Effort normal Neurologic: Patient is alert Skin: Skin is warm Psychiatric: Patient has normal mood and affect    Ortho Exam: The exam demonstrates good cervical spine range of motion 5 out of 5 grip EPL FPL interosseous wrist flexion-extension biceps triceps and upper strength.  She does have some restricted passive range of motion the left shoulder with forward flexion and abduction Rotation all limited about 10 on the left compared to the right.  Radial pulses intact bilaterally.  Rotator cuff strength excellent infraspinous super space subscap muscle testing.  Negative apprehension relocation testing.  Specialty Comments:  No specialty comments available.  Imaging: No results found.   PMFS History: Patient Active Problem List   Diagnosis Date Noted  . Acute pain of left shoulder 10/14/2016  . Hypertension 10/14/2016  .  S/P thyroidectomy 06/27/2016  . Chronic bilateral low back pain without sciatica 04/22/2016  . Facial dermatitis 04/22/2016  . Generalized abdominal pain 03/18/2016  . Liver lesion 12/24/2015  . Papillary thyroid carcinoma ([place]) 12/21/2015  . Controlled type 2 diabetes mellitus with complication, with long-term current use of insulin ([place]) 10/10/2015  . Right leg DVT ([place]) 10/10/2015  . Vision loss 08/25/2015  . Decreased vision in both eyes 08/25/2015  . Prolonged Q-T interval on ECG 08/19/2015  . Abnormal uterine bleeding (AUB)   . Leiomyoma of uterus   . History of pulmonary embolism 08/10/2015  . Asthma 04/13/2015   . Fibroid, uterine   . Morbid obesity ([place]) 11/11/2014  . Sinus tachycardia 11/08/2014  . Migraine 10/14/2014  . Iron deficiency anemia due to chronic blood loss 09/09/2014  . Menorrhagia 09/08/2014   Past Medical History:  Diagnosis Date  . ADHD (attention deficit hyperactivity disorder)   . Arthritis   . Asthma   . Cancer ([place])   . Chicken pox AGE 35  . Diabetes mellitus without complication ([place])   . GERD (gastroesophageal reflux disease)   . Glaucoma    BOTH EYES, NO EYE DROPS  . History of blood transfusion MARCH 2016    2UNITS GIVEN AND IRON GIVEN  . Hyperglycemia 09/09/2014  . Hypertension   . 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 Iron deficiency anemia due to chronic blood loss 09/09/2014  . Low TSH level 09/08/2014  . Lumbar herniated disc   . Menorrhagia 09/08/2014  . Migraine    CLUSTER AND MIGRAINES  . Multiple thyroid nodules   . Ovarian mass, right 09/09/2014  . PE (pulmonary embolism) 2016  . Peripheral neuropathy    FINGER TIPS AND TOES NUMB SOME  . Preeclampsia  1983   WITH PREGNANCY  . PTSD (post-traumatic stress disorder)   . Scoliosis   . Seizures (HCC) AGE 60   NONE SINCE, HAD CHICKEN POX THEN    Family History  Problem Relation Age of Onset  . Diabetes Mother   . Breast cancer Mother   . CAD Mother   . Hypertension Mother   . Alcohol abuse Father   . Hypertension Father     Past Surgical History:  Procedure Laterality Date  . ANGIOGRAM TO LEG  08-13-15   RIGHT  . CHOLECYSTECTOMY    . IR GENERIC HISTORICAL  04/04/2016   IR RADIOLOGIST EVAL & MGMT 04/04/2016 [name], MD [facility] INTERV RAD  . THYROIDECTOMY N/A 11/17/2015   Procedure: TOTAL THYROIDECTOMY;  Surgeon: Armandina Gemma, MD;  Location: [facility]S;  Service: General;  Laterality: N/A;  . UTERINE ARTERY EMBOLIZATION Bilateral 08/13/2015   Social History   Occupational History  . Not on file.   Social History Main  Topics  . Smoking status: Former Smoker    Packs/day: 0.50    Years: 40.00    Types: Cigarettes    Quit date: 11/03/2014  . Smokeless tobacco: Never Used  . Alcohol use No  . Drug use: No  . Sexual activity: No

## 2016-12-23 ENCOUNTER — Ambulatory Visit: Payer: Medicaid Other | Attending: Orthopedic Surgery | Admitting: Physical Therapy

## 2016-12-23 ENCOUNTER — Encounter: Payer: Self-pay | Admitting: Physical Therapy

## 2016-12-23 DIAGNOSIS — M25512 Pain in left shoulder: Secondary | ICD-10-CM | POA: Insufficient documentation

## 2016-12-23 NOTE — Therapy (Signed)
[place], [place], [address] Phone: [phone]   Fax:  [phone]  Physical Therapy Evaluation  Patient Details  Name: Melinda [name] MRN: [id number] Date of Birth: [birth year] Referring Provider: Marlou Sa  Encounter Date: 12/23/2016      PT End of Session - 12/23/16 1342    Visit Number 1   Authorization Type Medicaid allows 1 visit   PT Start Time 1250   PT Stop Time 1340   PT Time Calculation (min) 50 min   Activity Tolerance Patient limited by pain   Behavior During Therapy [facility] for tasks assessed/performed      Past Medical History:  Diagnosis Date  . ADHD (attention deficit hyperactivity disorder)   . Arthritis   . Asthma   . Cancer ([place])   . Chicken pox AGE 18  . Diabetes mellitus without complication ([place])   . GERD (gastroesophageal reflux disease)   . Glaucoma    BOTH EYES, NO EYE DROPS  . History of blood transfusion MARCH 2016    2UNITS GIVEN AND IRON GIVEN  . Hyperglycemia 09/09/2014  . Hypertension   . 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 Iron deficiency anemia due to chronic blood loss 09/09/2014  . Low TSH level 09/08/2014  . Lumbar herniated disc   . Menorrhagia 09/08/2014  . Migraine    CLUSTER AND MIGRAINES  . Multiple thyroid nodules   . Ovarian mass, right 09/09/2014  . PE (pulmonary embolism) 2016  . Peripheral neuropathy    FINGER TIPS AND TOES NUMB SOME  . Preeclampsia  1983   WITH PREGNANCY  . PTSD (post-traumatic stress disorder)   . Scoliosis   . Seizures ([place]) AGE 89   NONE SINCE, HAD CHICKEN POX THEN    Past Surgical History:  Procedure Laterality Date  . ANGIOGRAM TO LEG  08-13-15   RIGHT  . CHOLECYSTECTOMY    . IR GENERIC HISTORICAL  04/04/2016   IR RADIOLOGIST EVAL & MGMT 04/04/2016 [name], MD [facility] INTERV RAD  . THYROIDECTOMY N/A 11/17/2015   Procedure: TOTAL  THYROIDECTOMY;  Surgeon: Armandina Gemma, MD;  Location: [facility];  Service: General;  Laterality: N/A;  . UTERINE ARTERY EMBOLIZATION Bilateral 08/13/2015    There were no vitals filed for this visit.       Subjective Assessment - 12/23/16 1259    Subjective Pateint reports that about a month ago she started having left shoulder pain, reports no known cause.  She reports x-rays showed cervical arthritis and reports that hte MD sais frozen shoulder.   Limitations House hold activities   Patient Stated Goals have better ROM and less pain   Currently in Pain? Yes   Pain Score 5    Pain Location Shoulder   Pain Orientation Left   Pain Descriptors / Indicators Burning   Pain Type Acute pain   Pain Radiating Towards reports some pain in the left elbow, reports some numbness in the fingers "all of them"   Pain Onset More than a month ago   Pain Frequency Constant   Aggravating Factors  moving the arm, reaching behind, reports pain can be 10/10   Pain Relieving Factors holding arm in a sling type posistion pain can be 0/10   Effect of Pain on Daily Activities difficulty dressing and doing hair  [facility] PT Assessment - 12/23/16 0001      Assessment   Medical Diagnosis left shoulder pain   Referring Provider [place]   Onset Date/Surgical Date 11/22/16   Hand Dominance Right   Prior Therapy no     Precautions   Precautions None     Balance Screen   Has the patient fallen in the past 6 months No   Has the patient had a decrease in activity level because of a fear of falling?  No   Is the patient reluctant to leave their home because of a fear of falling?  No     Home Environment   Additional Comments does housework     Prior Function   Level of Independence Independent with household mobility without device   Vocation On disability   Leisure some walking for exercise     Posture/Postural Control   Posture Comments fwd head, rounded shoulders     ROM / Strength   AROM /  PROM / Strength AROM;PROM;Strength     AROM   AROM Assessment Site Shoulder;Elbow;Cervical   Right/Left Shoulder Left   Left Shoulder Flexion 100 Degrees   Left Shoulder ABduction 70 Degrees   Left Shoulder Internal Rotation --  Unable to reach pocket   Left Shoulder External Rotation --  unable to reach ear   Right/Left Elbow Left   Left Elbow Flexion 140   Left Elbow Extension 15   Cervical Flexion WNL   Cervical Extension WNL   Cervical - Right Side Bend decreased 50%   Cervical - Left Side Bend decreased 50%   Cervical - Right Rotation decreased 25%   Cervical - Left Rotation decreased 25%     PROM   PROM Assessment Site Shoulder   Right/Left Shoulder Left   Left Shoulder Flexion 110 Degrees   Left Shoulder ABduction 70 Degrees     Strength   Overall Strength Comments elbow and hand WFL's, unable to test shoulder due to pain and ROM loss     Flexibility   Soft Tissue Assessment /Muscle Length --  tightness int he left UE, + neural tension     Palpation   Palpation comment tender in the anterior shoulder            Objective measurements completed on examination: See above findings.                       PT Long Term Goals - 12/23/16 1349      PT LONG TERM GOAL #1   Title independent with HEP   Time 1   Period Days   Status Achieved                Plan - 12/23/16 1344    Clinical Impression Statement Patient has been having left shoulder pain for about a month, no known cause, x-rays show arthritis, patient reports that the MD said "frozen shoulder".  She holds the arm in a sling position to relieve pain, all motions incresae pain, her ROM of the left shoulder was severely limited especially with IR/ER.  Pain was motly anterior left shoulder, she was tender in this area.  She had some c/o numbness in the fingers .  Her cervical ROM was good without any increase of her symptoms.   Clinical Presentation Evolving   Clinical  Decision Making Low   Rehab Potential Fair   Clinical Impairments Affecting Rehab Potential Medicaid   PT Frequency One time  visit   PT Treatment/Interventions ADLs/Self Care Home Management;Patient/family education   PT Next Visit Plan Medicaid allows one visit, gave her HEP, and information about the [facility] pro bono clinic   Consulted and Agree with Plan of Care Patient      Patient will benefit from skilled therapeutic intervention in order to improve the following deficits and impairments:  Decreased range of motion, Decreased strength, Increased muscle spasms, Pain, Impaired UE functional use, Impaired flexibility, Postural dysfunction  Visit Diagnosis: Acute pain of left shoulder - Plan: PT plan of care cert/re-cert     Problem List Patient Active Problem List   Diagnosis Date Noted  . Acute pain of left shoulder 10/14/2016  . Hypertension 10/14/2016  . S/P thyroidectomy 06/27/2016  . Chronic bilateral low back pain without sciatica 04/22/2016  . Facial dermatitis 04/22/2016  . Generalized abdominal pain 03/18/2016  . Liver lesion 12/24/2015  . Papillary thyroid carcinoma ([place]) 12/21/2015  . Controlled type 2 diabetes mellitus with complication, with long-term current use of insulin ([place]) 10/10/2015  . Right leg DVT ([place]) 10/10/2015  . Vision loss 08/25/2015  . Decreased vision in both eyes 08/25/2015  . Prolonged Q-T interval on ECG 08/19/2015  . Abnormal uterine bleeding (AUB)   . Leiomyoma of uterus   . History of pulmonary embolism 08/10/2015  . Asthma 04/13/2015  . Fibroid, uterine   . Morbid obesity ([place]) 11/11/2014  . Sinus tachycardia 11/08/2014  . Migraine 10/14/2014  . Iron deficiency anemia due to chronic blood loss 09/09/2014  . Menorrhagia 09/08/2014    Sumner Boast., PT 12/23/2016, 1:51 PM  [facility] [address] [place], [place], [address] Phone: [phone]   Fax:   [phone]  Name: Melinda [name] MRN: [id number] Date of Birth: [birth year]

## 2016-12-23 NOTE — Patient Instructions (Signed)
Cane Exercise: Extension / Internal Rotation   Stand holding cane behind back with both hands palm-up. Slide cane up spine toward head. Hold _5___ seconds.  Repeat __10__ times, 2 sets, Do _2___ sessions per day.  SHOULDER: External Rotation - Supine (Cane)   Hold cane with both hands. Rotate arm away from body. Keep elbow on floor and next to body. _10__ reps per set, _2__ sets per day, _2__ days per day Add towel to keep elbow at side.  SHOULDER: Flexion At Wall   Slide both arms up wall while leaning gently into wall. Maintain upright posture and tuck in stomach. Hold _5__ seconds,  10___ reps per set, _2__ sets, 2 times per day  Active Assistive Shoulder External Rotation   Stand with stick at waist level, right palm up, other palm down. Step to side, push forearm out from body with hand palm down, and keep elbows bent. Hold. Side step and return to start position. Perform ___ reps.  Copyright  VHI. All rights reserved.  Elevation / Depression   While slowly inhaling, bring shoulders toward ears. Hold _2__ seconds. Slowly exhale while relaxing shoulders backward and downward. Repeat _10__ times. Do _2__ times per day.  SCAPULA: Retraction   Pinch shoulder blades together. Do not shrug shoulders. Hold _3__ seconds.    Do 10__ reps per set,  _2__ sets per day, _2__ sessions per day.    Strengthening: Resisted Extension   Hold tubing in both hands arm forward. Pull arms back,keeping elbows straight. Repeat _10___ times per set. Do __2__ sets per session. Do _2Scapular Retraction: Bilateral   Facing anchor, pull arms back, bringing shoulder blades together. Elbows bent 90 degrees.  Repeat __10__ times per set. Do __2__ sets per session. Do __2__ sessions per day.   Shoulder Rotation: External    In shoulder width stance, place band around wrists, thumbs up and elbows bent to 90. Rotate arms outward, upper arm against side keeping elbows bent. Repeat _10_ times per  set.  Do _2_ sets per session. Do _2_ sessions per day.

## 2017-01-03 ENCOUNTER — Other Ambulatory Visit: Payer: Self-pay | Admitting: Family Medicine

## 2017-01-03 DIAGNOSIS — I10 Essential (primary) hypertension: Secondary | ICD-10-CM

## 2017-01-15 ENCOUNTER — Encounter: Payer: Self-pay | Admitting: Obstetrics & Gynecology

## 2017-01-15 ENCOUNTER — Ambulatory Visit (INDEPENDENT_AMBULATORY_CARE_PROVIDER_SITE_OTHER): Payer: Medicaid Other | Admitting: Obstetrics & Gynecology

## 2017-01-15 VITALS — BP 131/67 | HR 84 | Ht 70.0 in | Wt 322.7 lb

## 2017-01-15 DIAGNOSIS — D252 Subserosal leiomyoma of uterus: Secondary | ICD-10-CM

## 2017-01-15 DIAGNOSIS — D251 Intramural leiomyoma of uterus: Secondary | ICD-10-CM | POA: Diagnosis present

## 2017-01-15 DIAGNOSIS — N920 Excessive and frequent menstruation with regular cycle: Secondary | ICD-10-CM | POA: Diagnosis not present

## 2017-01-15 MED ORDER — MEGESTROL ACETATE 40 MG PO TABS: 120.0000 mg | ORAL_TABLET | Freq: Two times a day (BID) | ORAL | 6 refills | Status: DC

## 2017-01-15 NOTE — Progress Notes (Signed)
GYNECOLOGY OFFICE VISIT NOTE  History:  51 y.o. G1P0101 here today reporting AUB and increased pelvic pain. She had multiple co-morbidities and underwent uterine artery embolization in 08/2015 for AUB. Bleeding subsided after that but has restarted having bleeding and it is accompanied with pain. Pain starts in lower abdomen, radiates along both sides towards back.  Of note, patient has chronic low back pain with sciatica. No symptoms of anemia, had normal labs recently.  No current bleeding, it is sporadic in nature.  She denies any  abnormal vaginal discharge, bleeding, pelvic pain or other concerns.    Patient Active Problem List   Diagnosis Date Noted  . Acute pain of left shoulder 10/14/2016  . Hypertension 10/14/2016  . S/P thyroidectomy 06/27/2016  . Chronic bilateral low back pain without sciatica 04/22/2016  . Facial dermatitis 04/22/2016  . Generalized abdominal pain 03/18/2016  . Liver lesion 12/24/2015  . Papillary thyroid carcinoma ([place]) 12/21/2015  . Controlled type 2 diabetes mellitus with complication, with long-term current use of insulin ([employer]) 10/10/2015  . Right leg DVT ([place]) 10/10/2015  . Vision loss 08/25/2015  . Decreased vision in both eyes 08/25/2015  . Prolonged Q-T interval on ECG 08/19/2015  . Abnormal uterine bleeding (AUB)   . Leiomyoma of uterus   . History of pulmonary embolism 08/10/2015  . Asthma 04/13/2015  . Fibroid, uterine   . Morbid obesity ([place]) 11/11/2014  . Sinus tachycardia 11/08/2014  . Migraine 10/14/2014  . Iron deficiency anemia due to chronic blood loss 09/09/2014  . Menorrhagia 09/08/2014    Past Medical History:  Diagnosis Date  . ADHD (attention deficit hyperactivity disorder)   . Arthritis   . Asthma   . Cancer ([place])   . Chicken pox AGE 5  . Diabetes mellitus without complication ([place])   . GERD (gastroesophageal reflux disease)   . Glaucoma    BOTH EYES, NO EYE DROPS  . History of blood transfusion MARCH 2016   2UNITS GIVEN AND IRON GIVEN  . Hyperglycemia 09/09/2014  . Hypertension   . 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 Iron deficiency anemia due to chronic blood loss 09/09/2014  . Low TSH level 09/08/2014  . Lumbar herniated disc   . Menorrhagia 09/08/2014  . Migraine    CLUSTER AND MIGRAINES  . Multiple thyroid nodules   . Ovarian mass, right 09/09/2014  . PE (pulmonary embolism) 2016  . Peripheral neuropathy    FINGER TIPS AND TOES NUMB SOME  . Preeclampsia  1983   WITH PREGNANCY  . PTSD (post-traumatic stress disorder)   . Scoliosis   . Seizures ([place]) AGE 44   NONE SINCE, HAD CHICKEN POX THEN    Past Surgical History:  Procedure Laterality Date  . ANGIOGRAM TO LEG  08-13-15   RIGHT  . CHOLECYSTECTOMY    . IR GENERIC HISTORICAL  04/04/2016   IR RADIOLOGIST EVAL & MGMT 04/04/2016 [name], MD [facility] INTERV RAD  . THYROIDECTOMY N/A 11/17/2015   Procedure: TOTAL THYROIDECTOMY;  Surgeon: Armandina Gemma, MD;  Location: [facility];  Service: General;  Laterality: N/A;  . UTERINE ARTERY EMBOLIZATION Bilateral 08/13/2015    The following portions of the patient's history were reviewed and updated as appropriate: allergies, current medications, past family history, past medical history, past social history, past surgical history and problem list.   Health Maintenance:  Normal pap and negative HRHPV on 03/18/2016.  Normal mammogram on 10/19/2015.   Review of Systems:  Pertinent items noted in HPI and remainder of comprehensive ROS otherwise negative.   Objective:  Physical Exam BP 131/67   Pulse 84   Ht 5\' 10"  (1.778 m)   Wt (!) 322 lb 11.2 oz (146.4 kg)   LMP 01/01/2017 (Exact Date)   BMI 46.30 kg/m  CONSTITUTIONAL: Well-developed, well-nourished female in no acute distress.  HENT:  Normocephalic, atraumatic. External right and left ear normal. Oropharynx is clear and moist EYES: Conjunctivae and EOM are normal.  Pupils are equal, round, and reactive to light. No scleral icterus.  NECK: Normal range of motion, supple, no masses SKIN: Skin is warm and dry. No rash noted. Not diaphoretic. No erythema. No pallor. NEUROLOGIC: Alert and oriented to person, place, and time. Normal reflexes, muscle tone coordination. No cranial nerve deficit noted. PSYCHIATRIC: Normal mood and affect. Normal behavior. Normal judgment and thought content. CARDIOVASCULAR: Normal heart rate noted RESPIRATORY: Effort and breath sounds normal, no problems with respiration noted ABDOMEN: Soft, obese, no distention noted.  Diffuse, mild tenderness on palpation of lower abdomen.  PELVIC: Deferred MUSCULOSKELETAL: Normal range of motion. No edema noted.   Assessment & Plan:  1. Intramural and subserous leiomyoma of uterus 2. Menorrhagia with regular cycle Patient is not having bleeding today, but counseled about Megace. She was on 120 mg bid prior to [place] and is willing to restart this. This was prescribed. Ultrasound also ordered for reevaluation of fibroids, will follow up results and manage accordingly.  Advised to continue current pain medications as needed. - US Pelvis Complete; Future - US Transvaginal Non-OB; Future - megestrol (MEGACE) 40 MG tablet; Take 3 tablets (120 mg total) by mouth 2 (two) times daily.  Dispense: 180 tablet; Refill: 6 Bleeding precautions reviewed. Routine preventative health maintenance measures emphasized. Please refer to After Visit Summary for other counseling recommendations.   Return if symptoms worsen or fail to improve.  Total face-to-face time with patient: 25 minutes. Over 50% of encounter was spent on counseling and coordination of care.   Verita Schneiders, MD, [employer] Attending [place], [facility] for [employer], [place]

## 2017-01-15 NOTE — Progress Notes (Signed)
Elevated score on PHQ9. Stated that she is currently seeing a therapist.

## 2017-01-15 NOTE — Patient Instructions (Signed)
Return to clinic for any scheduled appointments or for any gynecologic concerns as needed.   

## 2017-01-22 ENCOUNTER — Ambulatory Visit (HOSPITAL_COMMUNITY)
Admission: RE | Admit: 2017-01-22 | Discharge: 2017-01-22 | Disposition: A | Discharge status: Home / Self Care | Payer: Medicaid Other | Source: Ambulatory Visit | Attending: Obstetrics & Gynecology | Admitting: Obstetrics & Gynecology

## 2017-01-22 DIAGNOSIS — D252 Subserosal leiomyoma of uterus: Secondary | ICD-10-CM

## 2017-01-22 DIAGNOSIS — D251 Intramural leiomyoma of uterus: Secondary | ICD-10-CM | POA: Insufficient documentation

## 2017-01-22 DIAGNOSIS — N920 Excessive and frequent menstruation with regular cycle: Secondary | ICD-10-CM | POA: Insufficient documentation

## 2017-01-22 DIAGNOSIS — D250 Submucous leiomyoma of uterus: Secondary | ICD-10-CM | POA: Diagnosis not present

## 2017-02-03 ENCOUNTER — Ambulatory Visit: Payer: Medicaid Other | Attending: Internal Medicine | Admitting: Internal Medicine

## 2017-02-03 ENCOUNTER — Encounter: Payer: Self-pay | Admitting: Internal Medicine

## 2017-02-03 VITALS — BP 121/81 | HR 79 | Temp 98.5°F | Resp 16 | Wt 328.6 lb

## 2017-02-03 DIAGNOSIS — G43909 Migraine, unspecified, not intractable, without status migrainosus: Secondary | ICD-10-CM | POA: Diagnosis not present

## 2017-02-03 DIAGNOSIS — Z87891 Personal history of nicotine dependence: Secondary | ICD-10-CM | POA: Insufficient documentation

## 2017-02-03 DIAGNOSIS — Z881 Allergy status to other antibiotic agents status: Secondary | ICD-10-CM | POA: Diagnosis not present

## 2017-02-03 DIAGNOSIS — I10 Essential (primary) hypertension: Secondary | ICD-10-CM

## 2017-02-03 DIAGNOSIS — K769 Liver disease, unspecified: Secondary | ICD-10-CM | POA: Diagnosis not present

## 2017-02-03 DIAGNOSIS — E890 Postprocedural hypothyroidism: Secondary | ICD-10-CM | POA: Insufficient documentation

## 2017-02-03 DIAGNOSIS — Z833 Family history of diabetes mellitus: Secondary | ICD-10-CM | POA: Insufficient documentation

## 2017-02-03 DIAGNOSIS — F329 Major depressive disorder, single episode, unspecified: Secondary | ICD-10-CM

## 2017-02-03 DIAGNOSIS — N920 Excessive and frequent menstruation with regular cycle: Secondary | ICD-10-CM | POA: Diagnosis not present

## 2017-02-03 DIAGNOSIS — H543 Unqualified visual loss, both eyes: Secondary | ICD-10-CM | POA: Diagnosis not present

## 2017-02-03 DIAGNOSIS — Z7902 Long term (current) use of antithrombotics/antiplatelets: Secondary | ICD-10-CM | POA: Diagnosis not present

## 2017-02-03 DIAGNOSIS — Z8249 Family history of ischemic heart disease and other diseases of the circulatory system: Secondary | ICD-10-CM | POA: Insufficient documentation

## 2017-02-03 DIAGNOSIS — F32A Depression, unspecified: Secondary | ICD-10-CM

## 2017-02-03 DIAGNOSIS — Z9109 Other allergy status, other than to drugs and biological substances: Secondary | ICD-10-CM | POA: Insufficient documentation

## 2017-02-03 DIAGNOSIS — D259 Leiomyoma of uterus, unspecified: Secondary | ICD-10-CM

## 2017-02-03 DIAGNOSIS — Z880 Allergy status to penicillin: Secondary | ICD-10-CM | POA: Diagnosis not present

## 2017-02-03 DIAGNOSIS — Z91018 Allergy to other foods: Secondary | ICD-10-CM | POA: Insufficient documentation

## 2017-02-03 DIAGNOSIS — Z9049 Acquired absence of other specified parts of digestive tract: Secondary | ICD-10-CM | POA: Insufficient documentation

## 2017-02-03 DIAGNOSIS — E118 Type 2 diabetes mellitus with unspecified complications: Secondary | ICD-10-CM

## 2017-02-03 DIAGNOSIS — J4540 Moderate persistent asthma, uncomplicated: Secondary | ICD-10-CM

## 2017-02-03 DIAGNOSIS — Z91013 Allergy to seafood: Secondary | ICD-10-CM | POA: Insufficient documentation

## 2017-02-03 DIAGNOSIS — D500 Iron deficiency anemia secondary to blood loss (chronic): Secondary | ICD-10-CM | POA: Diagnosis not present

## 2017-02-03 DIAGNOSIS — Z794 Long term (current) use of insulin: Secondary | ICD-10-CM

## 2017-02-03 DIAGNOSIS — M25512 Pain in left shoulder: Secondary | ICD-10-CM | POA: Diagnosis not present

## 2017-02-03 DIAGNOSIS — Z6841 Body Mass Index (BMI) 40.0 and over, adult: Secondary | ICD-10-CM | POA: Diagnosis not present

## 2017-02-03 DIAGNOSIS — Z9889 Other specified postprocedural states: Secondary | ICD-10-CM | POA: Insufficient documentation

## 2017-02-03 DIAGNOSIS — Z86711 Personal history of pulmonary embolism: Secondary | ICD-10-CM | POA: Insufficient documentation

## 2017-02-03 DIAGNOSIS — Z803 Family history of malignant neoplasm of breast: Secondary | ICD-10-CM | POA: Insufficient documentation

## 2017-02-03 DIAGNOSIS — Z85850 Personal history of malignant neoplasm of thyroid: Secondary | ICD-10-CM | POA: Insufficient documentation

## 2017-02-03 DIAGNOSIS — Z811 Family history of alcohol abuse and dependence: Secondary | ICD-10-CM | POA: Insufficient documentation

## 2017-02-03 DIAGNOSIS — Z86718 Personal history of other venous thrombosis and embolism: Secondary | ICD-10-CM | POA: Insufficient documentation

## 2017-02-03 LAB — GLUCOSE, POCT (MANUAL RESULT ENTRY): POC Glucose: 123 mg/dl — AB (ref 70–99)

## 2017-02-03 NOTE — Patient Instructions (Signed)
Use the Advair every day as prescribed.  This is your maintenance inhaler.  Use the albuterol inhaler as needed.  This is your rescue inhaler.    Asthma, Adult Asthma is a recurring condition in which the airways tighten and narrow. Asthma can make it difficult to breathe. It can cause coughing, wheezing, and shortness of breath. Asthma episodes, also called asthma attacks, range from minor to life-threatening. Asthma cannot be cured, but medicines and lifestyle changes can help control it. What are the causes? Asthma is believed to be caused by inherited (genetic) and environmental factors, but its exact cause is unknown. Asthma may be triggered by allergens, lung infections, or irritants in the air. Asthma triggers are different for each person. Common triggers include:  Animal dander.  Dust mites.  Cockroaches.  Pollen from trees or grass.  Mold.  Smoke.  Air pollutants such as dust, household cleaners, hair sprays, aerosol sprays, paint fumes, strong chemicals, or strong odors.  Cold air, weather changes, and winds (which increase molds and pollens in the air).  Strong emotional expressions such as crying or laughing hard.  Stress.  Certain medicines (such as aspirin) or types of drugs (such as beta-blockers).  Sulfites in foods and drinks. Foods and drinks that may contain sulfites include dried fruit, potato chips, and sparkling grape juice.  Infections or inflammatory conditions such as the flu, a cold, or an inflammation of the nasal membranes (rhinitis).  Gastroesophageal reflux disease (GERD).  Exercise or strenuous activity.  What are the signs or symptoms? Symptoms may occur immediately after asthma is triggered or many hours later. Symptoms include:  Wheezing.  Excessive nighttime or early morning coughing.  Frequent or severe coughing with a common cold.  Chest tightness.  Shortness of breath.  How is this diagnosed? The diagnosis of asthma is made by  a review of your medical history and a physical exam. Tests may also be performed. These may include:  Lung function studies. These tests show how much air you breathe in and out.  Allergy tests.  Imaging tests such as X-rays.  How is this treated? Asthma cannot be cured, but it can usually be controlled. Treatment involves identifying and avoiding your asthma triggers. It also involves medicines. There are 2 classes of medicine used for asthma treatment:  Controller medicines. These prevent asthma symptoms from occurring. They are usually taken every day.  Reliever or rescue medicines. These quickly relieve asthma symptoms. They are used as needed and provide short-term relief.  Your health care provider will help you create an asthma action plan. An asthma action plan is a written plan for managing and treating your asthma attacks. It includes a list of your asthma triggers and how they may be avoided. It also includes information on when medicines should be taken and when their dosage should be changed. An action plan may also involve the use of a device called a peak flow meter. A peak flow meter measures how well the lungs are working. It helps you monitor your condition. Follow these instructions at home:  Take medicines only as directed by your health care provider. Speak with your health care provider if you have questions about how or when to take the medicines.  Use a peak flow meter as directed by your health care provider. Record and keep track of readings.  Understand and use the action plan to help minimize or stop an asthma attack without needing to seek medical care.  Control your home environment in  the following ways to help prevent asthma attacks: ? Do not smoke. Avoid being exposed to secondhand smoke. ? Change your heating and air conditioning filter regularly. ? Limit your use of fireplaces and wood stoves. ? Get rid of pests (such as roaches and mice) and their  droppings. ? Throw away plants if you see mold on them. ? Clean your floors and dust regularly. Use unscented cleaning products. ? Try to have someone else vacuum for you regularly. Stay out of rooms while they are being vacuumed and for a short while afterward. If you vacuum, use a dust mask from a hardware store, a double-layered or microfilter vacuum cleaner bag, or a vacuum cleaner with a HEPA filter. ? Replace carpet with wood, tile, or vinyl flooring. Carpet can trap dander and dust. ? Use allergy-proof pillows, mattress covers, and box spring covers. ? Wash bed sheets and blankets every week in hot water and dry them in a dryer. ? Use blankets that are made of polyester or cotton. ? Clean bathrooms and kitchens with bleach. If possible, have someone repaint the walls in these rooms with mold-resistant paint. Keep out of the rooms that are being cleaned and painted. ? Wash hands frequently. Contact a health care provider if:  You have wheezing, shortness of breath, or a cough even if taking medicine to prevent attacks.  The colored mucus you cough up (sputum) is thicker than usual.  Your sputum changes from clear or white to yellow, green, gray, or bloody.  You have any problems that may be related to the medicines you are taking (such as a rash, itching, swelling, or trouble breathing).  You are using a reliever medicine more than 2-3 times per week.  Your peak flow is still at 50-79% of your personal best after following your action plan for 1 hour.  You have a fever. Get help right away if:  You seem to be getting worse and are unresponsive to treatment during an asthma attack.  You are short of breath even at rest.  You get short of breath when doing very little physical activity.  You have difficulty eating, drinking, or talking due to asthma symptoms.  You develop chest pain.  You develop a fast heartbeat.  You have a bluish color to your lips or fingernails.  You  are light-headed, dizzy, or faint.  Your peak flow is less than 50% of your personal best. This information is not intended to replace advice given to you by your health care provider. Make sure you discuss any questions you have with your health care provider. Document Released: 06/17/2005 Document Revised: 11/29/2015 Document Reviewed: 01/14/2013 Elsevier Interactive Patient Education  2017 [employer].

## 2017-02-03 NOTE — Progress Notes (Signed)
Patient ID: Melinda [name], female    DOB: Nov 02, 1965  MRN: [id number]  CC: re-establish; Hypertension; and Diabetes   Subjective: Melinda [name] is a 51 y.o. female who presents for follow-up visit and to become establish with me as PCP. Last saw Dr. Adrian Blackwater 10/2016 Her concerns today include:  51 year old with history of DM, HTN, moderate persistent asthma, migraines followed by neurology, uterine fibroids followed by GYN, papillary thyroid CA s/p thyroidectomy, history of DVT/PE on Eliquis, obesity and left shoulder pain.  1. Saw her GYN 7/18 and was restarted on Megace and pelvic ultrasound ordered. + fibroids -2 days late for menses and having strong pain today. No sexually active  2. Saw ortho 12/11/2016 and was diagnosed with left frozen shoulder. Referred to PT for improve ROM. -saw P.T once and was shown exercises.  She does the home exercises using arm bands but still painful.    3. DM: -checks BS BID before breakfast and dinner -14 day average 107, and 30 day average 109 based on reading from meter which she has with her today.  -compliant with Metformin, Humalog and Lantus -doing well with eating habits --uses stationary pedals, mini trampoline and arm bands -sees Dr. Buddy Duty at [facility] 2 x a yr for DM and hx of thyroid CA.  Also saw her onc surgeon a few mths ago and was released from her care.   4. HTN -compliant with HCTZ and Metoprolol  5.  DVT/PE -on lifetime Eliquis  6. Asthma: Since childhood.  Quit smoking 2 yrs ago -uses Albuterol once every couple days, Mneb at bedtime.  Using Advair PRN rather than BID -bothered by heat and pollen. Wears a mask always when outdoors  7. Depression -sees psychiatrist Q 3 mth and theraptist once a wk. [facility]es to [facility] On Celexa, Elavil and Prazosin  Patient Active Problem List   Diagnosis Date Noted  . Acute pain of left shoulder 10/14/2016  . Hypertension 10/14/2016  . S/P thyroidectomy 06/27/2016  . Chronic bilateral low  back pain without sciatica 04/22/2016  . Facial dermatitis 04/22/2016  . Generalized abdominal pain 03/18/2016  . Liver lesion 12/24/2015  . Papillary thyroid carcinoma ([place]) 12/21/2015  . Controlled type 2 diabetes mellitus with complication, with long-term current use of insulin ([place]) 10/10/2015  . Right leg DVT ([place]) 10/10/2015  . Vision loss 08/25/2015  . Decreased vision in both eyes 08/25/2015  . Prolonged Q-T interval on ECG 08/19/2015  . Abnormal uterine bleeding (AUB)   . Leiomyoma of uterus   . History of pulmonary embolism 08/10/2015  . Asthma 04/13/2015  . Fibroid, uterine   . Morbid obesity ([place]) 11/11/2014  . Sinus tachycardia 11/08/2014  . Migraine 10/14/2014  . Iron deficiency anemia due to chronic blood loss 09/09/2014  . Menorrhagia 09/08/2014     Current Outpatient Prescriptions on File Prior to Visit  Medication Sig Dispense Refill  . ACCU-CHEK AVIVA PLUS test strip USE TO TEST BLOOD GLUCOSE FOUR TIMES DAILY BEFORE MEALS AND AT BEDTIME 100 each 12  . albuterol (PROVENTIL) (2.5 MG/3ML) 0.083% nebulizer solution Take 3 mLs (2.5 mg total) by nebulization every 6 (six) hours as needed for wheezing or shortness of breath. 150 mL 1  . amitriptyline (ELAVIL) 50 MG tablet Take 50 mg by mouth at bedtime.    Marland Kitchen apixaban (ELIQUIS) 5 MG TABS tablet 10 mg twice daily for one week, followed by 5 mg twice daily 60 tablet 2  . atorvastatin (LIPITOR) 20 MG tablet Take 1  tablet (20 mg total) by mouth daily. 90 tablet 3  . buPROPion (WELLBUTRIN XL) 150 MG 24 hr tablet Take 150 mg by mouth daily.    . citalopram (CELEXA) 10 MG tablet Take 10 mg by mouth daily.    . diclofenac sodium (VOLTAREN) 1 % GEL Apply 4 g topically 4 (four) times daily. To neck and left shoulder 100 g 5  . ELIQUIS 5 MG TABS tablet TAKE ONE (1) TABLET BY MOUTH TWO (2) TIMES DAILY (Patient not taking: Reported on 02/03/2017) 60 tablet 11  . ferrous sulfate 325 (65 FE) MG tablet TAKE 1 TABLET BY MOUTH 2 TIMES DAILY  WITH A MEAL 60 tablet 11  . fluticasone (CUTIVATE) 0.05 % cream Apply topically 2 (two) times daily. 30 g 2  . fluticasone (FLONASE) 50 MCG/ACT nasal spray Place 2 sprays into both nostrils daily. 16 g 6  . Fluticasone-Salmeterol (ADVAIR DISKUS) 100-50 MCG/DOSE AEPB Inhale 1 puff into the lungs 2 (two) times daily. 60 each 5  . gabapentin (NEURONTIN) 100 MG capsule TAKE 1 CAPSULE BY MOUTH THREE TIMES A DAY 90 capsule 2  . hydrochlorothiazide (MICROZIDE) 12.5 MG capsule TAKE ONE CAPSULE BY MOUTH DAILY 30 capsule 2  . insulin lispro (HUMALOG) 100 UNIT/ML injection Inject 6 Units into the skin 3 (three) times daily with meals. 8 U at beginning of meal in V-Go 30, up to 100 units/day     . levothyroxine (SYNTHROID, LEVOTHROID) 200 MCG tablet Take by mouth in the morning  with 25 mcg tablet for total dose of 225 mcg daily 30 tablet 2  . megestrol (MEGACE) 40 MG tablet Take 3 tablets (120 mg total) by mouth 2 (two) times daily. 180 tablet 6  . metFORMIN (GLUCOPHAGE) 850 MG tablet Take 1 tablet (850 mg total) by mouth 2 (two) times daily with a meal. 180 tablet 3  . methocarbamol (ROBAXIN) 500 MG tablet Take 1 tablet (500 mg total) by mouth 4 (four) times daily. (Patient not taking: Reported on 02/03/2017) 60 tablet 2  . methocarbamol (ROBAXIN) 500 MG tablet TAKE ONE (1) TABLET BY MOUTH FOUR (4) TIMES DAILY 60 tablet 2  . metoprolol tartrate (LOPRESSOR) 50 MG tablet TAKE ONE (1) TABLET BY MOUTH TWO (2) TIMES DAILY 60 tablet 2  . montelukast (SINGULAIR) 10 MG tablet Take 1 tablet (10 mg total) by mouth at bedtime. 30 tablet 3  . ondansetron (ZOFRAN-ODT) 4 MG disintegrating tablet TAKE ONE TABLET BY MOUTH EVERY EIGHT HOURS AS NEEDED FOR NAUSEA OR VOMITING 20 tablet 0  . prazosin (MINIPRESS) 1 MG capsule Take 1 mg by mouth at bedtime.    Marland Kitchen PROAIR HFA 108 (90 Base) MCG/ACT inhaler INHALE 2 PUFFS INTO THE LUNGS EVERY 6 HOURS AS NEEDED FOR WHEEZING ORSHORTNESS OF BREATH 8.5 g 0  . rizatriptan (MAXALT) 10 MG tablet  Take 1 tablet earliest onset of headache.  May repeat once after 2 hours if needed 10 tablet 3  . SYNTHROID 25 MCG tablet TAKE ONE TABLET BY MOUTH DAILY WITH 200MCG SYNTHROID TO EQUAL 225MCG DAILY. TAKE BEFORE BREAKFAST (Patient not taking: Reported on 02/03/2017) 30 tablet 2  . topiramate (TOPAMAX) 25 MG tablet Take 3 tablets (75 mg total) by mouth at bedtime. 90 tablet 4  . traMADol (ULTRAM) 50 MG tablet Take 1 tablet (50 mg total) by mouth every 8 (eight) hours as needed. 60 tablet 2   No current facility-administered medications on file prior to visit.     Allergies  Allergen Reactions  .  Ceftriaxone Anaphylaxis    ROCEPHIN  . Penicillins Shortness Of Breath    Has patient had a PCN reaction causing immediate rash, facial/tongue/throat swelling, SOB or lightheadedness with hypotension: Yes Has patient had a PCN reaction causing severe rash involving mucus membranes or skin necrosis: No Has patient had a PCN reaction that required hospitalization No Has patient had a PCN reaction occurring within the last 10 years: No If all of the above answers are "NO", then may proceed with Cephalosporin use.   . Shellfish Allergy Anaphylaxis  . Gold-Containing Drug Products     HANDS ITCH  . Nickel     HANDS SWELL  . Citrus Rash    Social History   Social History  . Marital status: Single    Spouse name: N/A  . Number of children: N/A  . Years of education: N/A   Occupational History  . Not on file.   Social History Main Topics  . Smoking status: Former Smoker    Packs/day: 0.50    Years: 40.00    Types: Cigarettes    Quit date: 11/03/2014  . Smokeless tobacco: Never Used  . Alcohol use No  . Drug use: No  . Sexual activity: No   Other Topics Concern  . Not on file   Social History Narrative  . No narrative on file    Family History  Problem Relation Age of Onset  . Diabetes Mother   . Breast cancer Mother   . CAD Mother   . Hypertension Mother   . Alcohol abuse Father    . Hypertension Father     Past Surgical History:  Procedure Laterality Date  . ANGIOGRAM TO LEG  08-13-15   RIGHT  . CHOLECYSTECTOMY    . IR GENERIC HISTORICAL  04/04/2016   IR RADIOLOGIST EVAL & MGMT 04/04/2016 [name], MD [facility] INTERV RAD  . THYROIDECTOMY N/A 11/17/2015   Procedure: TOTAL THYROIDECTOMY;  Surgeon: Armandina Gemma, MD;  Location: [facility];  Service: General;  Laterality: N/A;  . UTERINE ARTERY EMBOLIZATION Bilateral 08/13/2015    ROS: Review of Systems Negative except as stated above PHYSICAL EXAM: BP 121/81   Pulse 79   Temp 98.5 F (36.9 C) (Oral)   Resp 16   Wt (!) 328 lb 9.6 oz (149.1 kg)   SpO2 99%   BMI 47.15 kg/m   Physical Exam  General appearance - alert, well appearing, morbidly obese African-American female and in no distress Mental status - alert, oriented to person, place, and time, normal mood, behavior, speech, dress, motor activity, and thought processes Eyes -slightly pale conjunctiva  Mouth - mucous membranes moist, pharynx normal without lesions Neck - supple, no significant adenopathy Chest - clear to auscultation, no wheezes, rales or rhonchi, symmetric air entry Heart - normal rate, regular rhythm, normal S1, S2, no murmurs, rubs, clicks or gallops Extremities -no LE edema Diabetic Foot Exam - Simple   Simple Foot Form Visual Inspection No deformities, no ulcerations, no other skin breakdown bilaterally:  Yes Sensation Testing Intact to touch and monofilament testing bilaterally:  Yes Pulse Check Posterior Tibialis and Dorsalis pulse intact bilaterally:  Yes Comments     Depression screen PHQ 2/9 02/03/2017  Decreased Interest 1  Down, Depressed, Hopeless 1  PHQ - 2 Score 2  Altered sleeping 0  Tired, decreased energy 1  Change in appetite 1  Feeling bad or failure about yourself  0  Trouble concentrating 1  Moving slowly or fidgety/restless 0  Suicidal  thoughts 0  PHQ-9 Score 5  Some recent data might be hidden   GAD 7  : Generalized Anxiety Score 02/03/2017 01/15/2017 11/28/2016 10/14/2016  Nervous, Anxious, on Edge 1 1 1 1   Control/stop worrying 0 1 1 0  Worry too much - different things 0 0 1 0  Trouble relaxing 1 1 1 1   Restless 1 1 1 1   Easily annoyed or irritable 1 1 2  0  Afraid - awful might happen 0 0 1 0  Total GAD 7 Score 4 5 8 3    Lab Results  Component Value Date   HGBA1C 5.7 10/14/2016   Results for orders placed or performed in visit on 02/03/17  POCT glucose (manual entry)  Result Value Ref Range   POC Glucose 123 (A) 70 - 99 mg/dl     ASSESSMENT AND PLAN: 1. Controlled type 2 diabetes mellitus with complication, with long-term current use of insulin (HCC) -good control.  Continue Lantus, Humalog and Metformin Encouraged to continue healthy eating habits and regular exercise as she has been doing - POCT glucose (manual entry) - Comprehensive metabolic panel - CBC  2. Essential hypertension At goal. Continue current medications.  3. Uterine leiomyoma, unspecified location On Megace per GYN Dr. Harolyn Rutherford  4. Moderate persistent asthma without complication Discussed the difference between rescue inhaler and maintenance inhaler. Encourage use of Advair daily and hopefully we will see a decrease in use of rescue inhaler  5. Depression, unspecified depression type In care with [facility]  6. Left shoulder pain, unspecified chronicity   Patient was given the opportunity to ask questions.  Patient verbalized understanding of the plan and was able to repeat key elements of the plan.   Orders Placed This Encounter  Procedures  . Comprehensive metabolic panel  . CBC  . POCT glucose (manual entry)     Requested Prescriptions    No prescriptions requested or ordered in this encounter    Return in about 3 months (around 05/06/2017).  [name], MD, FACP

## 2017-02-04 ENCOUNTER — Other Ambulatory Visit: Payer: Self-pay | Admitting: Family Medicine

## 2017-02-04 DIAGNOSIS — J4550 Severe persistent asthma, uncomplicated: Secondary | ICD-10-CM

## 2017-02-04 DIAGNOSIS — M545 Low back pain, unspecified: Secondary | ICD-10-CM

## 2017-02-04 DIAGNOSIS — G8929 Other chronic pain: Secondary | ICD-10-CM

## 2017-02-04 LAB — COMPREHENSIVE METABOLIC PANEL
A/G RATIO: 1.3 (ref 1.2–2.2)
ALBUMIN: 4 g/dL (ref 3.5–5.5)
ALT: 13 IU/L (ref 0–32)
AST: 6 IU/L (ref 0–40)
Alkaline Phosphatase: 120 IU/L — ABNORMAL HIGH (ref 39–117)
BUN / CREAT RATIO: 13 (ref 9–23)
BUN: 12 mg/dL (ref 6–24)
Bilirubin Total: <0.2 mg/dL (ref 0.0–1.2)
CO2: 20 mmol/L (ref 20–29)
CREATININE: 0.89 mg/dL (ref 0.57–1.00)
Calcium: 9.1 mg/dL (ref 8.7–10.2)
Chloride: 111 mmol/L — ABNORMAL HIGH (ref 96–106)
GFR calc Af Amer: 89 mL/min/{1.73_m2} (ref >59)
GFR, EST NON AFRICAN AMERICAN: 77 mL/min/{1.73_m2} (ref >59)
GLOBULIN, TOTAL: 3.1 g/dL (ref 1.5–4.5)
Glucose: 89 mg/dL (ref 65–99)
Potassium: 4.1 mmol/L (ref 3.5–5.2)
SODIUM: 143 mmol/L (ref 134–144)
Total Protein: 7.1 g/dL (ref 6.0–8.5)

## 2017-02-04 LAB — CBC
Hematocrit: 41.2 % (ref 34.0–46.6)
Hemoglobin: 13.5 g/dL (ref 11.1–15.9)
MCH: 28.2 pg (ref 26.6–33.0)
MCHC: 32.8 g/dL (ref 31.5–35.7)
MCV: 86 fL (ref 79–97)
PLATELETS: 389 10*3/uL — AB (ref 150–379)
RBC: 4.78 x10E6/uL (ref 3.77–5.28)
RDW: 15.3 % (ref 12.3–15.4)
WBC: 12.4 10*3/uL — AB (ref 3.4–10.8)

## 2017-02-24 ENCOUNTER — Other Ambulatory Visit: Payer: Self-pay | Admitting: Family Medicine

## 2017-02-24 MED ORDER — ALBUTEROL SULFATE HFA 108 (90 BASE) MCG/ACT IN AERS: INHALATION_SPRAY | RESPIRATORY_TRACT | 1 refills | Status: DC

## 2017-02-24 NOTE — Addendum Note (Signed)
Addended by: Rica Mast on: 02/24/2017 04:57 PM   Modules accepted: Orders

## 2017-03-06 ENCOUNTER — Other Ambulatory Visit: Payer: Self-pay | Admitting: Family Medicine

## 2017-03-06 DIAGNOSIS — E118 Type 2 diabetes mellitus with unspecified complications: Secondary | ICD-10-CM

## 2017-03-06 DIAGNOSIS — Z794 Long term (current) use of insulin: Principal | ICD-10-CM

## 2017-03-15 IMAGING — DX DG CHEST 1V PORT
1 series · 1 of 1 positions shown · non-contrast
Comparison: 01/16/2015

CLINICAL DATA: Shortness of breath.

EXAM:
PORTABLE CHEST 1 VIEW

[chest ap]
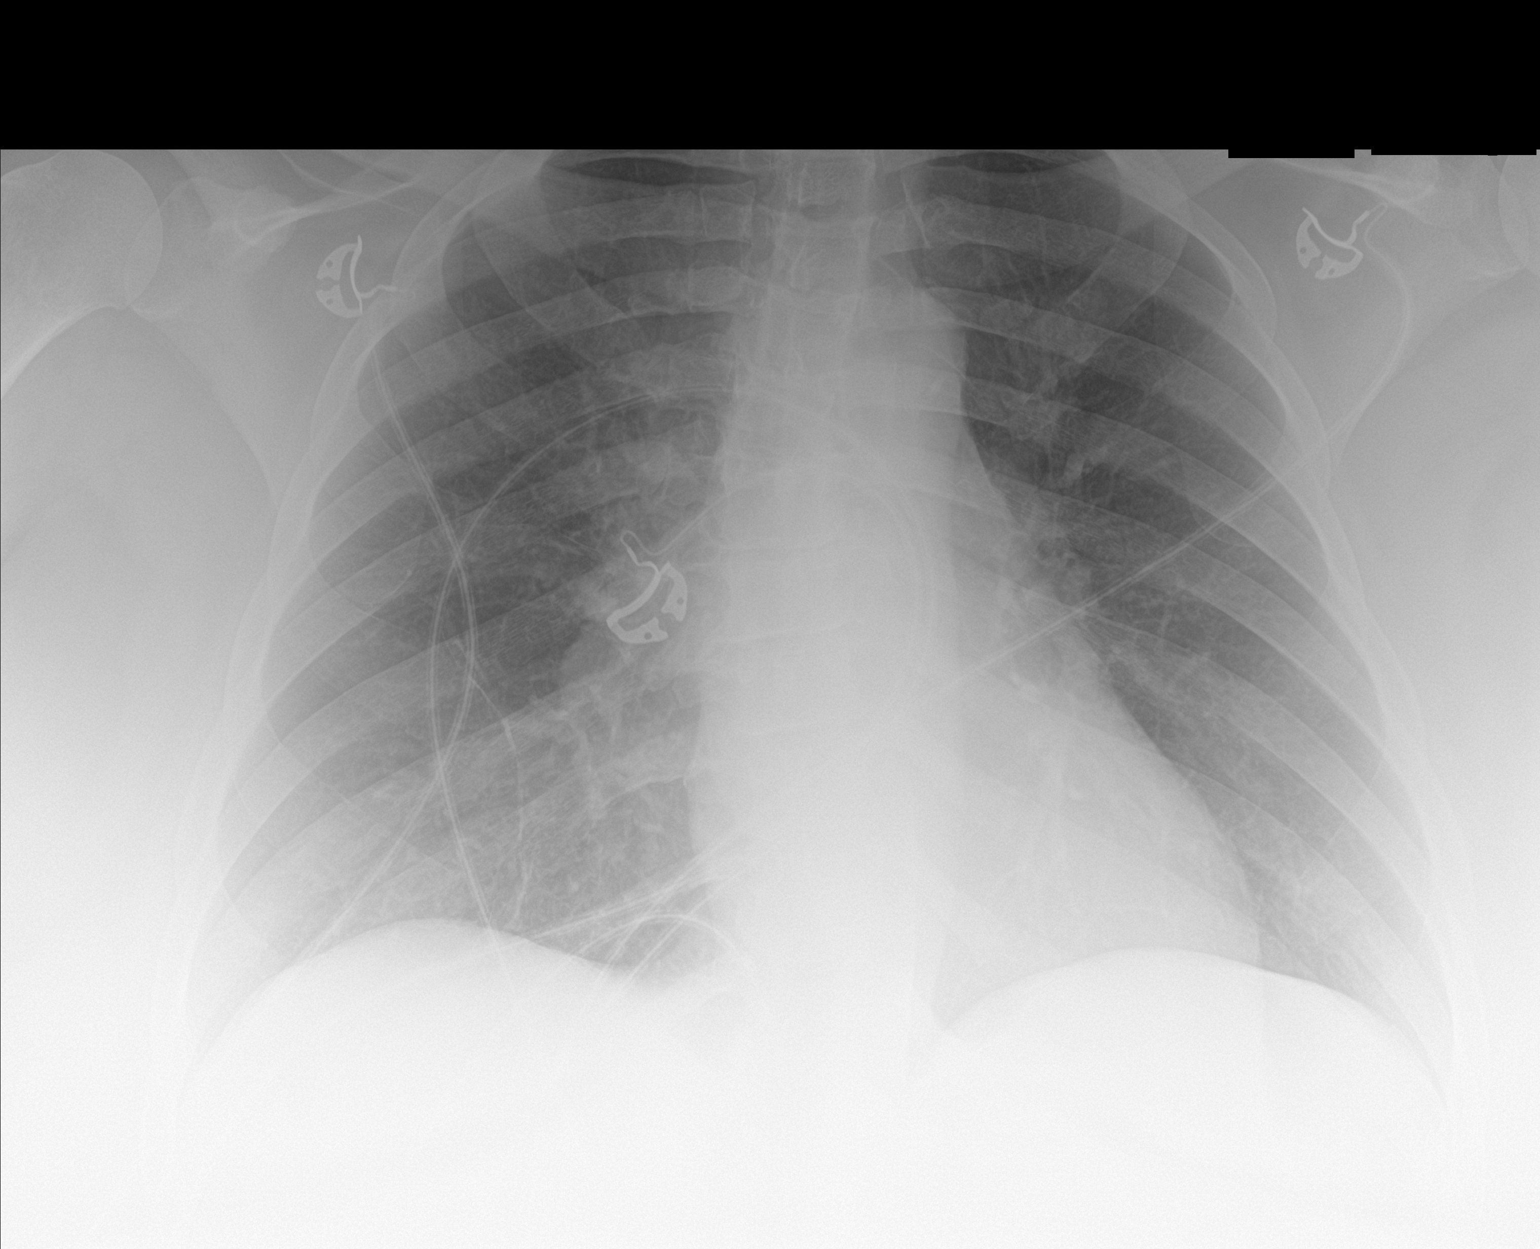

[1 of 1 positions shown; findings below may reference images not displayed]

FINDINGS: Mild linear scarring in the right lower lobe. Lungs appear otherwise
clear. Cardiac and mediastinal margins appear normal. No pleural
effusion identified.
IMPRESSION: 1.  No active cardiopulmonary disease is radiographically apparent.
2. Chronic scarring in the right lower lobe.

## 2017-03-17 IMAGING — XA IR ANGIO/ADD [PERSON_NAME]
1 series · 13 of 24 positions shown · IV contrast (IODINE)
Comparison: none

INDICATION: Symptomatic uterine fibroids. Patient with persistent uterine daily
refractory uterine bleeding.

[Series 300: ir transcath/emboliz · 13 of 276 slices shown]
[im 1/276]
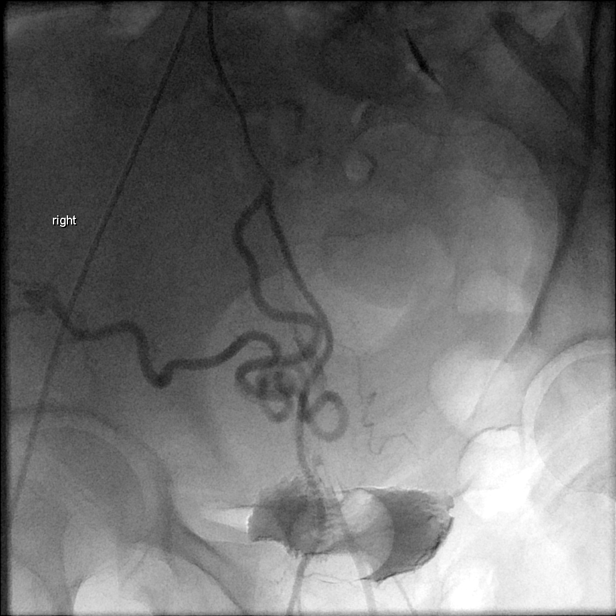
[im 24/276]
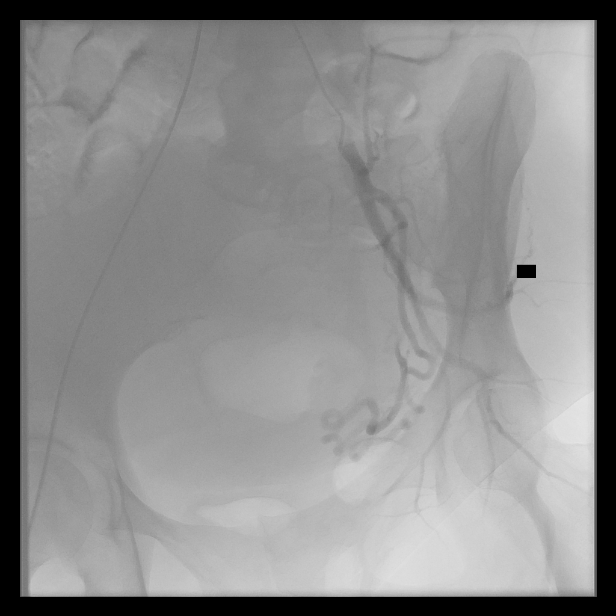
[im 48/276]
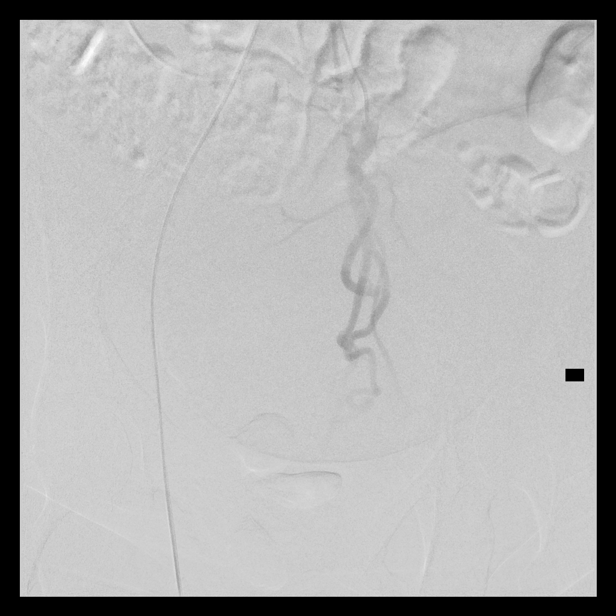
[im 72/276]
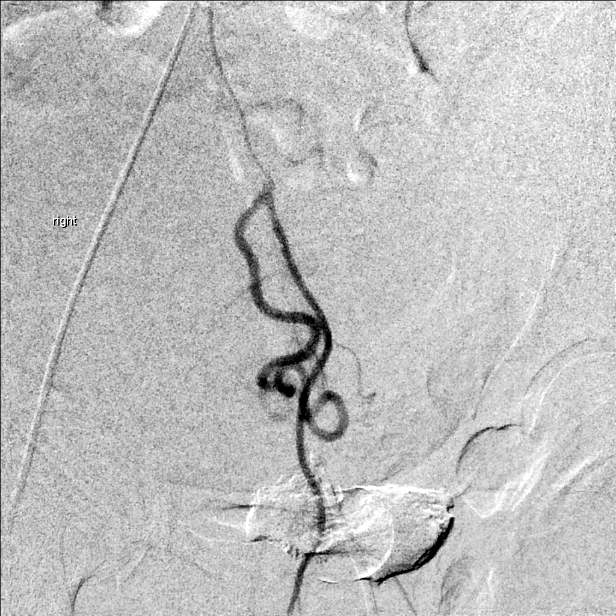
[im 96/276]
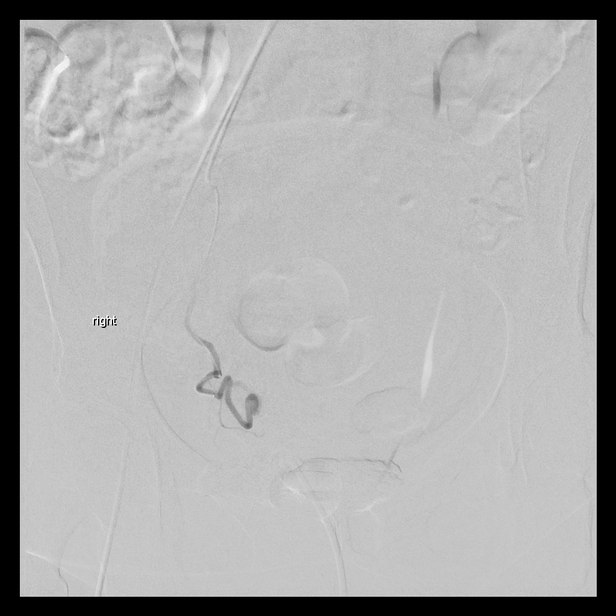
[im 120/276]
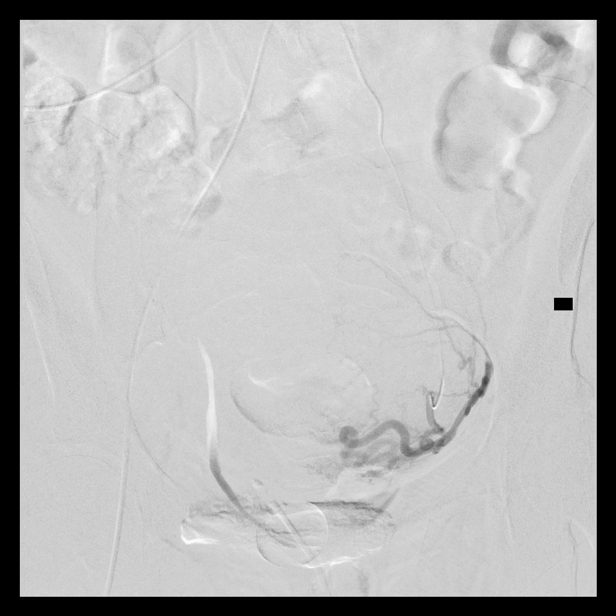
[im 144/276]
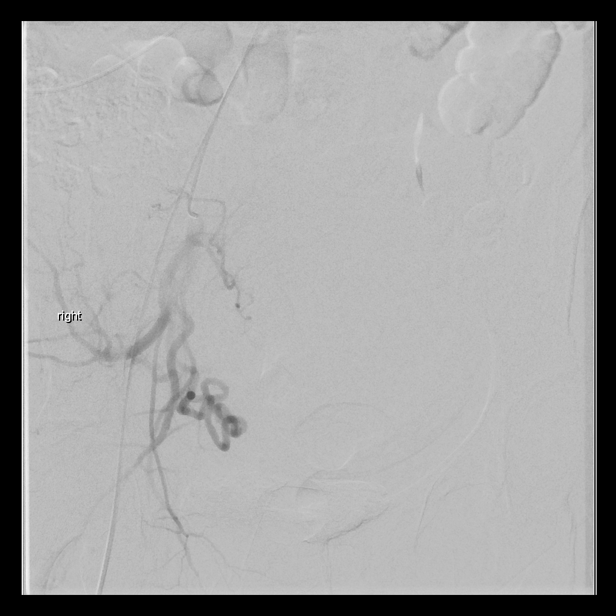
[im 156/276]
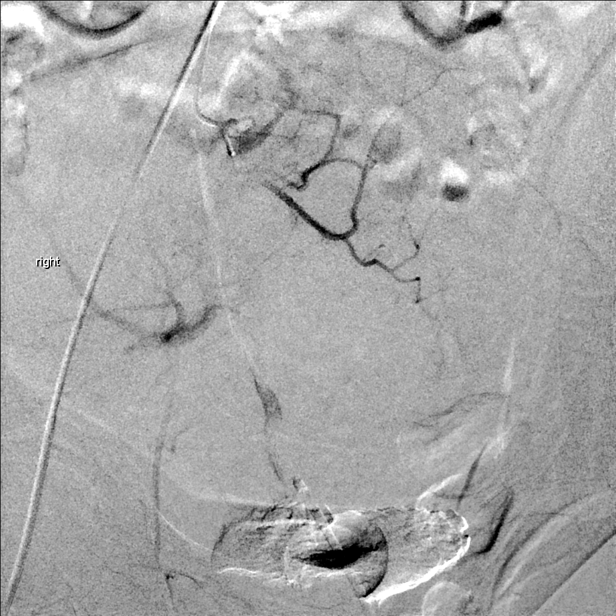
[im 180/276]
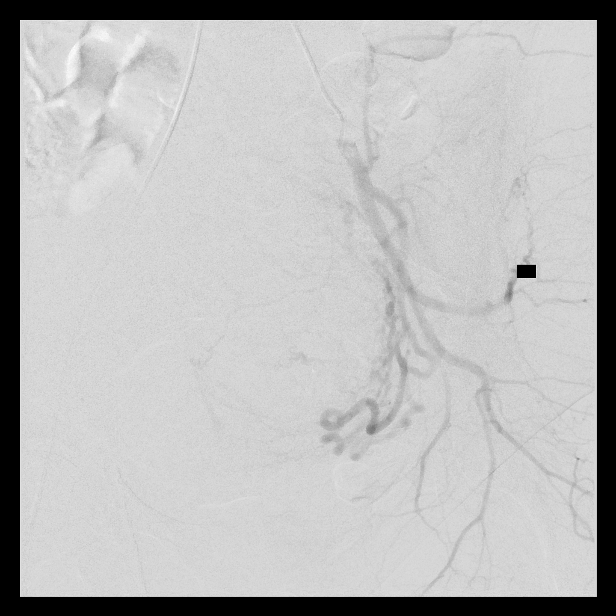
[im 204/276]
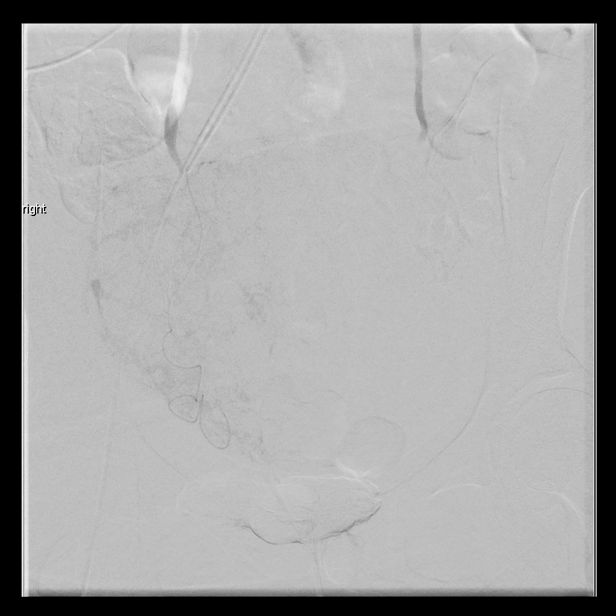
[im 228/276]
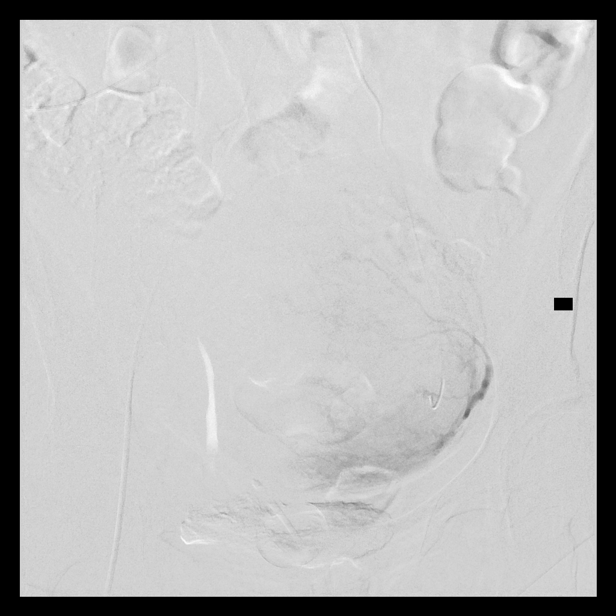
[im 252/276]
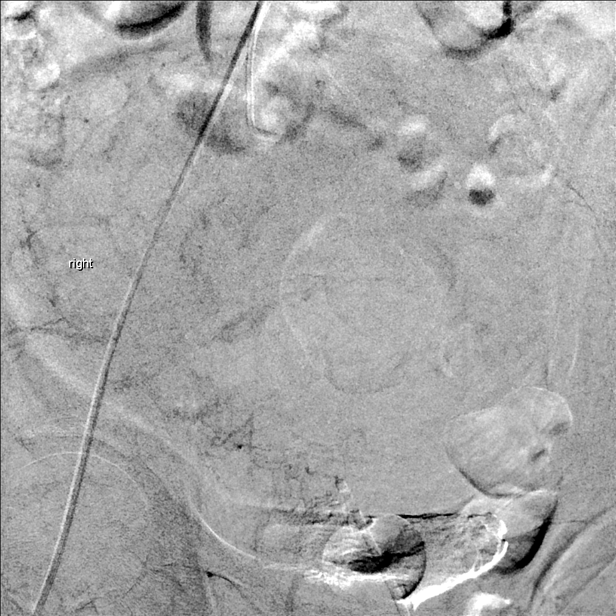
[im 276/276]
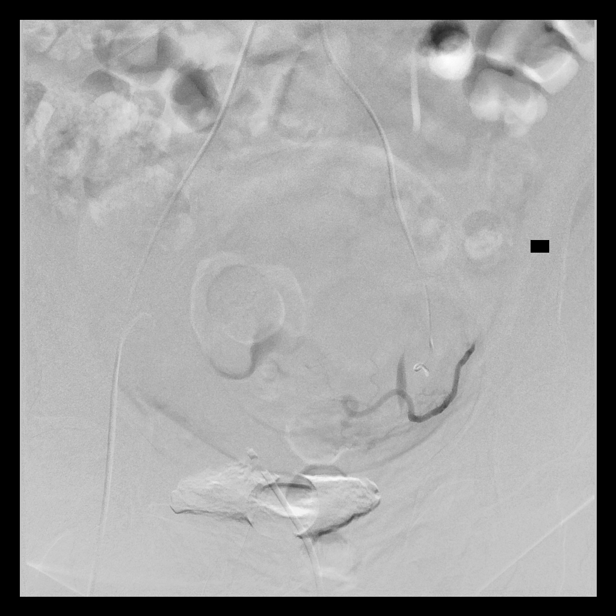

[13 of 24 positions shown; findings below may reference images not displayed]

EXAM:
1. SELECTIVE BILATERAL INTERNAL ILIAC ARTERIOGRAM
2. SELECTIVE BILATERAL ANTERIOR DIVISION INTERNAL ILIAC ARTERIOGRAM
3. SELECTIVE BILATERAL UTERINE ARTERY ARTERIOGRAM AND PERCUTANEOUS
PARTICLE EMBOLIZATION.
4. ULTRASOUND GUIDANCE FOR ARTERIAL ACCESS

MEDICATIONS:
Zofran 4 mg IV, Vancomycin 1.5 g IV; The antibiotic was administered
within one hour of the procedure

ANESTHESIA/SEDATION:
Moderate (conscious) sedation was employed during this procedure. A
total of Toradol 30 mg, Versed 6 mg and Fentanyl 300 mcg was
administered intravenously.

Moderate Sedation Time: 120 minutes. The patient's level of
consciousness and vital signs were monitored continuously by
radiology nursing throughout the procedure under my direct
supervision.

CONTRAST:  215mL OMNIPAQUE IOHEXOL 300 MG/ML  SOLN

FLUOROSCOPY TIME:  38 minutes 24 seconds (4,695 mGy)

COMPLICATIONS:
None immediate.



The right femoral head was marked fluoroscopically. Under sterile
conditions and local anesthesia, the right common femoral artery
access was performed with a micropuncture needle. Under direct
ultrasound guidance, the right common femoral was accessed with a
micropuncture kit. An ultrasound image was saved for documentation
purposes. This allowed for placement of a 5-French vascular sheath.
A limited arteriogram was performed through the side arm of the
sheath confirming appropriate access within the right common femoral
artery.

The 5-French C2 catheter was utilized to select the contralateral
left internal iliac artery. Selective left internal iliac angiogram
was performed. The tortuous left uterine artery was identified.
Selective catheterization was performed of the left uterine artery
with a microcatheter and micro guide wire. A selective left uterine
angiogram was performed. This demonstrated patency of the left
uterine artery. Mild diffuse hypervascularity of the enlarged
fibroid uterus. Access was adequate for embolization. For
embolization, 3 vials of 500 - 700 micron and 2 vials of 700-900
micron Embospheres were injected into the left uterine artery. Post
embolization angiogram confirms complete stasis of the left uterine
vascular territory. Microcatheter was removed.

The C2 catheter was retracted and utilized to select the right
internal iliac artery. Selective right internal iliac angiogram was
performed. The patent right uterine artery was identified. For
selective catheterization, the micro catheter and guidewire were
utilized to select the right uterine artery. Selective right uterine
angiogram was performed. This demonstrated patency of the right
uterine artery. Catheter position was safe for embolization.
Embolization was performed to complete stasis with injection of 2
vials of 500-700 micron Embospheres. Post embolization angiogram
confirms complete stasis of the right uterine vascular territory.

At this point, all wires, catheters and sheaths were removed from
the patient. Hemostasis was achieved at the right groin access site
with

The patient tolerated the procedure well without immediate post
procedural complication.
FINDINGS: Conventional branching pattern of the bilateral internal iliac
arteries. The bilateral uterine artery origins were noted to be
markedly tortuous though lobe both vessels are widely patent
supplying a markedly enlarged and myomatous uterus.

Completion arteriograms following bilateral uterine artery particle
embolization demonstrates a technically excellent result with stasis
of flow within the bilateral uterine vascular territories.
IMPRESSION: Successful bilateral uterine artery embolization (U [facility]).

## 2017-03-19 ENCOUNTER — Other Ambulatory Visit: Payer: Self-pay | Admitting: Internal Medicine

## 2017-03-19 DIAGNOSIS — M545 Low back pain: Principal | ICD-10-CM

## 2017-03-19 DIAGNOSIS — G8929 Other chronic pain: Secondary | ICD-10-CM

## 2017-04-01 ENCOUNTER — Encounter: Payer: Self-pay | Admitting: Neurology

## 2017-04-01 ENCOUNTER — Ambulatory Visit (INDEPENDENT_AMBULATORY_CARE_PROVIDER_SITE_OTHER): Payer: Medicaid Other | Admitting: Neurology

## 2017-04-01 VITALS — BP 130/84 | HR 81 | Ht 70.0 in | Wt 334.2 lb

## 2017-04-01 DIAGNOSIS — R202 Paresthesia of skin: Secondary | ICD-10-CM

## 2017-04-01 DIAGNOSIS — R200 Anesthesia of skin: Secondary | ICD-10-CM

## 2017-04-01 DIAGNOSIS — G43009 Migraine without aura, not intractable, without status migrainosus: Secondary | ICD-10-CM | POA: Diagnosis not present

## 2017-04-01 DIAGNOSIS — G4453 Primary thunderclap headache: Secondary | ICD-10-CM

## 2017-04-01 NOTE — Progress Notes (Signed)
NEUROLOGY FOLLOW UP OFFICE NOTE  Melinda [name] [id number]  HISTORY OF PRESENT ILLNESS: Melinda [name] is a 51 year old right-handed female with type 2 diabetes, asthma, iron deficiency anemia, chronic low back pain, PE/DVT, PTSD, and history of thyroid cancer status post thyroidectomy who follows up for migraine.   UPDATE: Migraines are controlled since restarting the Megace. Intensity:  8.5/10 Duration:  45 minutes with Maxalt (reduces intensity to manageable 4/10 for another couple of hours) Frequency:  3 days per month Frequency of abortive medication: as needed. Current NSAIDS:  Contraindicated (on anticoagulation) Current analgesics:  acetaminophen, tramadol (daily Current triptans:  Maxalt 10mg  Current anti-emetic: Zofran ODT 4mg  Current muscle relaxants: methocarbamol Current anti-anxiolytic:  no Current sleep aide:  no Current Antihypertensive medications:  metoprolol 50mg  twice daily, HCTZ Current Antidepressant medications:  bupropion 150mg  Current Anticonvulsant medications:  topiramate 50mg , gabapentin 100mg  three times daily Current Vitamins/Herbal/Supplements:  ferrous sulfate Current Antihistamines/Decongestants:  Flonase Other therapy:  Sees psychologist and psychiatrist, which helps.  However, she also reports new headaches, which she describes as "thunderclap" headache.  Onset was over a month ago.  It lasts for a couple of seconds and occurs once a week.  She has not had any vision loss, unilateral numbness or weakness or vertigo.  III.  She also reports numbness of the first 3 digits of her left hand.  It is worse when she wakes up in the morning.  She has a left "frozen shoulder."   Caffeine:  no Alcohol:  no Smoker:  no Diet:  Hydrates.  No soda.  Eats baked and broiled chicken.  No fried foods Exercise:  Stationary bike (limited due to chronic pain) Depression/anxiety:  Improved since seeing psychiatrist and psychologist.   HISTORY:  Onset:  For over 20  years but became daily 4 months. Location:  Right sided Quality:  squeezing Initial Intensity:  8.5/10 (sometimes 10/10); May:  8.5/10 Aura:  Sometimes preceded with visual aura (sees a pinwheel) Prodrome:  no Associated symptoms:  Nausea, photophobia, phonophobia.  No worse headache of life, change in quality, wakes her up from sleep. Initial Duration:  3 hours to all day; May: 45 minutes with Maxalt (reduces intensity to manageable 4/10 for another couple of hours) Initial Frequency:  daily; May:  5 to 10 days per month in a cluster during her cycle Triggers/exacerbating factors:  Apples, aged cheese, menstrual cycle Relieving factors:  no Activity:  Light activity aggravates   Past NSAIDS:  ibuprofen Past analgesics:  Excedrin Past abortive triptans:  Sumatriptan tablet/NS/[place] effective in past but 50mg  tablet no longer effective for this daily headache. Past muscle relaxants:  no Past anti-emetic:  no Past antihypertensive medications:  no Past antidepressant medications:  no Past anticonvulsant medications:  no Past vitamins/Herbal/Supplements:  no Past antihistamines/decongestants:  no Other past therapies:  no   Family history of headache:  Son has migraines.   CT of head from 01/16/15 was unremarkable.  PAST MEDICAL HISTORY: Past Medical History:  Diagnosis Date  . ADHD (attention deficit hyperactivity disorder)   . Arthritis   . Asthma   . Cancer ([place])   . Chicken pox AGE 36  . Diabetes mellitus without complication ([place])   . GERD (gastroesophageal reflux disease)   . Glaucoma    BOTH EYES, NO EYE DROPS  . Glaucoma   . History of blood transfusion MARCH 2016    2UNITS GIVEN AND IRON GIVEN  . Hyperglycemia 09/09/2014  . Hypertension   . 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 Iron deficiency anemia due to chronic blood loss 09/09/2014  . Low TSH level 09/08/2014  . Lumbar herniated disc   .  Menorrhagia 09/08/2014  . Migraine    CLUSTER AND MIGRAINES  . Multiple thyroid nodules   . Ovarian mass, right 09/09/2014  . PE (pulmonary embolism) 2016  . Peripheral neuropathy    FINGER TIPS AND TOES NUMB SOME  . Preeclampsia  1983   WITH PREGNANCY  . PTSD (post-traumatic stress disorder)   . Scoliosis   . Seizures ([place]) AGE 69   NONE SINCE, HAD CHICKEN POX THEN    MEDICATIONS: Current Outpatient Prescriptions on File Prior to Visit  Medication Sig Dispense Refill  . ELIQUIS 5 MG TABS tablet TAKE ONE (1) TABLET BY MOUTH TWO (2) TIMES DAILY 60 tablet 11  . ACCU-CHEK AVIVA PLUS test strip USE TO TEST BLOOD GLUCOSE FOUR TIMES DAILY BEFORE MEALS AND AT BEDTIME 100 each 12  . albuterol (PROAIR HFA) 108 (90 Base) MCG/ACT inhaler INHALE 2 PUFFS INTO THE LUNGS EVERY 6 HOURS AS NEEDED FOR WHEEZING ORSHORTNESS OF BREATH 8.5 g 1  . albuterol (PROVENTIL) (2.5 MG/3ML) 0.083% nebulizer solution Take 3 mLs (2.5 mg total) by nebulization every 6 (six) hours as needed for wheezing or shortness of breath. 150 mL 1  . amitriptyline (ELAVIL) 50 MG tablet Take 50 mg by mouth at bedtime.    Marland Kitchen apixaban (ELIQUIS) 5 MG TABS tablet 10 mg twice daily for one week, followed by 5 mg twice daily 60 tablet 2  . atorvastatin (LIPITOR) 20 MG tablet TAKE ONE (1) TABLET BY MOUTH EVERY DAY 90 tablet 1  . buPROPion (WELLBUTRIN XL) 150 MG 24 hr tablet Take 150 mg by mouth daily.    . citalopram (CELEXA) 10 MG tablet Take 10 mg by mouth daily.    . diclofenac sodium (VOLTAREN) 1 % GEL Apply 4 g topically 4 (four) times daily. To neck and left shoulder 100 g 5  . ferrous sulfate 325 (65 FE) MG tablet TAKE 1 TABLET BY MOUTH 2 TIMES DAILY WITH A MEAL 60 tablet 11  . fluticasone (CUTIVATE) 0.05 % cream Apply topically 2 (two) times daily. 30 g 2  . fluticasone (FLONASE) 50 MCG/ACT nasal spray Place 2 sprays into both nostrils daily. 16 g 6  . Fluticasone-Salmeterol (ADVAIR DISKUS) 100-50 MCG/DOSE AEPB Inhale 1 puff into the  lungs 2 (two) times daily. 60 each 5  . gabapentin (NEURONTIN) 100 MG capsule TAKE 1 CAPSULE BY MOUTH THREE TIMES A DAY 90 capsule 2  . hydrochlorothiazide (MICROZIDE) 12.5 MG capsule TAKE ONE CAPSULE BY MOUTH DAILY 30 capsule 2  . insulin glargine (LANTUS) 100 UNIT/ML injection Inject 24 Units into the skin at bedtime.    . insulin lispro (HUMALOG) 100 UNIT/ML injection Inject 6 Units into the skin 3 (three) times daily with meals. 8 U at beginning of meal in V-Go 30, up to 100 units/day     . levothyroxine (SYNTHROID, LEVOTHROID) 200 MCG tablet Take by mouth in the morning  with 25 mcg tablet for total dose of 225 mcg daily 30 tablet 2  . megestrol (MEGACE) 40 MG tablet Take 3 tablets (120 mg total) by mouth 2 (two) times daily. 180 tablet 6  . metFORMIN (GLUCOPHAGE) 850 MG tablet Take 1 tablet (850 mg total) by mouth 2 (two) times daily with a meal. 180 tablet 3  .  methocarbamol (ROBAXIN) 500 MG tablet TAKE 1 TABLET BY MOUTH EVERY 8 HOURS AS NEEDED FOR MUSCLE SPASMS. 40 tablet 3  . metoprolol tartrate (LOPRESSOR) 50 MG tablet TAKE ONE (1) TABLET BY MOUTH TWO (2) TIMES DAILY 60 tablet 2  . montelukast (SINGULAIR) 10 MG tablet TAKE ONE TABLET BY MOUTH EVERY NIGHT AT BEDTIME 30 tablet 6  . ondansetron (ZOFRAN-ODT) 4 MG disintegrating tablet TAKE ONE TABLET BY MOUTH EVERY EIGHT HOURS AS NEEDED FOR NAUSEA OR VOMITING 20 tablet 0  . prazosin (MINIPRESS) 1 MG capsule Take 1 mg by mouth at bedtime.    . rizatriptan (MAXALT) 10 MG tablet Take 1 tablet earliest onset of headache.  May repeat once after 2 hours if needed 10 tablet 3  . SYNTHROID 25 MCG tablet TAKE ONE TABLET BY MOUTH DAILY WITH 200MCG SYNTHROID TO EQUAL 225MCG DAILY. TAKE BEFORE BREAKFAST (Patient not taking: Reported on 02/03/2017) 30 tablet 2  . topiramate (TOPAMAX) 25 MG tablet Take 3 tablets (75 mg total) by mouth at bedtime. 90 tablet 4  . traMADol (ULTRAM) 50 MG tablet Take 1 tablet (50 mg total) by mouth every 8 (eight) hours as needed.  60 tablet 2   No current facility-administered medications on file prior to visit.     ALLERGIES: Allergies  Allergen Reactions  . Ceftriaxone Anaphylaxis    ROCEPHIN  . Penicillins Shortness Of Breath    Has patient had a PCN reaction causing immediate rash, facial/tongue/throat swelling, SOB or lightheadedness with hypotension: Yes Has patient had a PCN reaction causing severe rash involving mucus membranes or skin necrosis: No Has patient had a PCN reaction that required hospitalization No Has patient had a PCN reaction occurring within the last 10 years: No If all of the above answers are "NO", then may proceed with Cephalosporin use.   . Shellfish Allergy Anaphylaxis  . Gold-Containing Drug Products     HANDS ITCH  . Nick[place]     HANDS SWELL  . Citrus Rash    FAMILY HISTORY: Family History  Problem Relation Age of Onset  . Diabetes Mother   . Breast cancer Mother   . CAD Mother   . Hypertension Mother   . Alcohol abuse Father   . Hypertension Father     SOCIAL HISTORY: Social History   Social History  . Marital status: Single    Spouse name: N/A  . Number of children: N/A  . Years of education: N/A   Occupational History  . Not on file.   Social History Main Topics  . Smoking status: Former Smoker    Packs/day: 0.50    Years: 40.00    Types: Cigarettes    Quit date: 11/03/2014  . Smokeless tobacco: Never Used  . Alcohol use No  . Drug use: No  . Sexual activity: No   Other Topics Concern  . Not on file   Social History Narrative  . No narrative on file    REVIEW OF SYSTEMS: Constitutional: No fevers, chills, or sweats, no generalized fatigue, change in appetite Eyes: No visual changes, double vision, eye pain Ear, nose and throat: No hearing loss, ear pain, nasal congestion, sore throat Cardiovascular: No chest pain, palpitations Respiratory:  No shortness of breath at rest or with exertion, wheezes GastrointestinaI: No nausea, vomiting,  diarrhea, abdominal pain, fecal incontinence Genitourinary:  No dysuria, urinary retention or frequency Musculoskeletal:  No neck pain, back pain Integumentary: No rash, pruritus, skin lesions Neurological: as above Psychiatric: No depression, insomnia, anxiety Endocrine:  No palpitations, fatigue, diaphoresis, mood swings, change in appetite, change in weight, increased thirst Hematologic/Lymphatic:  No purpura, petechiae. Allergic/Immunologic: no itchy/runny eyes, nasal congestion, recent allergic reactions, rashes  PHYSICAL EXAM: Vitals:   04/01/17 0928  BP: 130/84  Pulse: 81  SpO2: 97%   General: No acute distress.  Patient appears well-groomed.  Morbidly obese body habitus. Head:  Normocephalic/atraumatic Eyes:  Fundi examined but not visualized Neck: supple, no paraspinal tenderness, full range of motion Heart:  Regular rate and rhythm Lungs:  Clear to auscultation bilaterally Back: No paraspinal tenderness Neurological Exam: alert and oriented to person, place, and time. Attention span and concentration intact, recent and remote memory intact, fund of knowledge intact.  Speech fluent and not dysarthric, language intact.  CN II-XII intact. Bulk and tone normal, muscle strength 5/5 throughout.  Sensation to light touch, temperature and vibration intact.  Deep tendon reflexes 2+ throughout, toes downgoing.  Finger to nose and heel to shin testing intact.  Limp, Romberg negative.  IMPRESSION: 1.  Migraine, controlled 2.  Thunderclap headache.  It is not an acute issue but is new since last visit.  Therefore, it requires further evaluation 3.  Suspect left carpal tunnel syndrome 4.  Morbid obesity  PLAN: 1.  CT/CTA of head to rule out cerebral aneurysm.  Further recommendations pending results. 2.  Continue current regimen 3.  Try wearing wrist splint at night.  Consider NCV-EMG 4.  Weight loss 5.  Follow up in 5 months.  25 minutes spent face to face with patient, over 50%  spent discussing diagnosis and management.  Metta Clines, DO  CC:  [name], MD

## 2017-04-01 NOTE — Patient Instructions (Addendum)
1.  We will check CT and CTA of head 2.  Continue current regimen 3.  For left hand numbness, try wearing a wrist splint at night.  If not improved, we can order a nerve test 4.  Follow up in 5 months.

## 2017-04-07 ENCOUNTER — Other Ambulatory Visit: Payer: Self-pay | Admitting: Family Medicine

## 2017-04-07 ENCOUNTER — Other Ambulatory Visit: Payer: Self-pay | Admitting: Neurology

## 2017-04-07 DIAGNOSIS — I10 Essential (primary) hypertension: Secondary | ICD-10-CM

## 2017-04-07 DIAGNOSIS — E890 Postprocedural hypothyroidism: Secondary | ICD-10-CM

## 2017-04-07 DIAGNOSIS — Z9889 Other specified postprocedural states: Secondary | ICD-10-CM

## 2017-04-17 ENCOUNTER — Telehealth: Payer: Self-pay

## 2017-04-17 NOTE — Telephone Encounter (Signed)
Caryn Section, LM on VM indicating per Dr [name], ok for CTA only, cx CT

## 2017-04-17 NOTE — Telephone Encounter (Signed)
-----   Message from Lattie Corns sent at 04/17/2017  9:45 AM EDT ----- Regarding: CT/CTA Spoke with Mardene Celeste from [facility].  She says that they cannot do both CT and CTA - is wanting to know which one should be ordered? Her direct line is [phone].

## 2017-04-24 ENCOUNTER — Ambulatory Visit
Admission: RE | Admit: 2017-04-24 | Discharge: 2017-04-24 | Disposition: A | Discharge status: Home / Self Care | Payer: Medicaid Other | Source: Ambulatory Visit | Attending: Neurology | Admitting: Neurology

## 2017-04-24 DIAGNOSIS — G4453 Primary thunderclap headache: Secondary | ICD-10-CM

## 2017-04-24 MED ORDER — IOPAMIDOL (ISOVUE-370) INJECTION 76%
75.0000 mL | Freq: Once | INTRAVENOUS | Status: AC | PRN
  Administered 2017-04-24: 75 mL via INTRAVENOUS

## 2017-05-02 ENCOUNTER — Telehealth: Payer: Self-pay

## 2017-05-02 NOTE — Telephone Encounter (Signed)
-----   Message from [name], DO sent at 04/25/2017  3:23 PM EDT ----- CT of head shows which may be a very tiny aneurysm.  However, it may also not be.  It is so small that I don't think it is anything significant.  I would repeat CTA of head in one year to just reevaluate.

## 2017-05-02 NOTE — Telephone Encounter (Signed)
Called and talked with Pt, advsd her of CT results and recommendation to repeat in one year.

## 2017-05-06 ENCOUNTER — Ambulatory Visit: Payer: Medicaid Other | Attending: Internal Medicine | Admitting: Internal Medicine

## 2017-05-06 VITALS — BP 126/82 | HR 81 | Temp 98.6°F | Resp 16 | Wt 354.8 lb

## 2017-05-06 DIAGNOSIS — M545 Low back pain: Secondary | ICD-10-CM

## 2017-05-06 DIAGNOSIS — M5441 Lumbago with sciatica, right side: Secondary | ICD-10-CM

## 2017-05-06 DIAGNOSIS — G8929 Other chronic pain: Secondary | ICD-10-CM

## 2017-05-06 DIAGNOSIS — Z79899 Other long term (current) drug therapy: Secondary | ICD-10-CM | POA: Insufficient documentation

## 2017-05-06 DIAGNOSIS — I10 Essential (primary) hypertension: Secondary | ICD-10-CM | POA: Insufficient documentation

## 2017-05-06 DIAGNOSIS — Z8249 Family history of ischemic heart disease and other diseases of the circulatory system: Secondary | ICD-10-CM | POA: Diagnosis not present

## 2017-05-06 DIAGNOSIS — M5442 Lumbago with sciatica, left side: Secondary | ICD-10-CM | POA: Diagnosis not present

## 2017-05-06 DIAGNOSIS — D219 Benign neoplasm of connective and other soft tissue, unspecified: Secondary | ICD-10-CM

## 2017-05-06 DIAGNOSIS — R0609 Other forms of dyspnea: Secondary | ICD-10-CM | POA: Diagnosis not present

## 2017-05-06 DIAGNOSIS — Z86711 Personal history of pulmonary embolism: Secondary | ICD-10-CM | POA: Diagnosis not present

## 2017-05-06 DIAGNOSIS — Z85850 Personal history of malignant neoplasm of thyroid: Secondary | ICD-10-CM | POA: Insufficient documentation

## 2017-05-06 DIAGNOSIS — Z23 Encounter for immunization: Secondary | ICD-10-CM | POA: Diagnosis not present

## 2017-05-06 DIAGNOSIS — D259 Leiomyoma of uterus, unspecified: Secondary | ICD-10-CM | POA: Diagnosis not present

## 2017-05-06 DIAGNOSIS — Z794 Long term (current) use of insulin: Secondary | ICD-10-CM

## 2017-05-06 DIAGNOSIS — R0600 Dyspnea, unspecified: Secondary | ICD-10-CM | POA: Diagnosis not present

## 2017-05-06 DIAGNOSIS — Z86718 Personal history of other venous thrombosis and embolism: Secondary | ICD-10-CM | POA: Insufficient documentation

## 2017-05-06 DIAGNOSIS — F329 Major depressive disorder, single episode, unspecified: Secondary | ICD-10-CM | POA: Diagnosis not present

## 2017-05-06 DIAGNOSIS — E118 Type 2 diabetes mellitus with unspecified complications: Secondary | ICD-10-CM | POA: Diagnosis not present

## 2017-05-06 DIAGNOSIS — J4540 Moderate persistent asthma, uncomplicated: Secondary | ICD-10-CM | POA: Insufficient documentation

## 2017-05-06 DIAGNOSIS — G43909 Migraine, unspecified, not intractable, without status migrainosus: Secondary | ICD-10-CM | POA: Diagnosis not present

## 2017-05-06 DIAGNOSIS — E890 Postprocedural hypothyroidism: Secondary | ICD-10-CM | POA: Insufficient documentation

## 2017-05-06 DIAGNOSIS — R269 Unspecified abnormalities of gait and mobility: Secondary | ICD-10-CM | POA: Diagnosis not present

## 2017-05-06 DIAGNOSIS — Z7901 Long term (current) use of anticoagulants: Secondary | ICD-10-CM | POA: Diagnosis not present

## 2017-05-06 DIAGNOSIS — F32A Depression, unspecified: Secondary | ICD-10-CM

## 2017-05-06 DIAGNOSIS — Z87891 Personal history of nicotine dependence: Secondary | ICD-10-CM | POA: Insufficient documentation

## 2017-05-06 LAB — POCT GLYCOSYLATED HEMOGLOBIN (HGB A1C): Hemoglobin A1C: 6

## 2017-05-06 LAB — POCT CBG (FASTING - GLUCOSE)-MANUAL ENTRY: Glucose Fasting, POC: 110 mg/dL — AB (ref 70–99)

## 2017-05-06 MED ORDER — TRAMADOL HCL 50 MG PO TABS: 50.0000 mg | ORAL_TABLET | Freq: Three times a day (TID) | ORAL | 2 refills | Status: DC | PRN

## 2017-05-06 NOTE — Patient Instructions (Addendum)
Stop Iron supplement.  Use the Tramadol as needed. You have been referred to physical therapy.   Follow a Healthy Eating Plan - You can do it! Limit sugary drinks.  Avoid sodas, sweet tea, sport or energy drinks, or fruit drinks.  Drink water, lo-fat milk, or diet drinks. Limit snack foods.   Cut back on candy, cake, cookies, chips, ice cream.  These are a special treat, only in small amounts. Eat plenty of vegetables.  Especially dark green, red, and orange vegetables. Aim for at least 3 servings a day. More is better! Include fruit in your daily diet.  Whole fruit is much healthier than fruit juice! Limit "white" bread, "white" pasta, "white" rice.   Choose "100% whole grain" products, brown or wild rice. Avoid fatty meats. Try "Meatless Monday" and choose eggs or beans one day a week.  When eating meat, choose lean meats like chicken, [place], and fish.  Grill, broil, or bake meats instead of frying, and eat poultry without the skin. Eat less salt.  Avoid frozen pizzas, frozen dinners and salty foods.  Use seasonings other than salt in cooking.  This can help blood pressure and keep you from swelling Beer, wine and liquor have calories.  If you can safely drink alcohol, limit to 1 drink per day for women, 2 drinks for men   Influenza Virus Vaccine injection (Fluarix) What is this medicine? INFLUENZA VIRUS VACCINE (in floo EN zuh VAHY ruhs vak SEEN) helps to reduce the risk of getting influenza also known as the flu. This medicine may be used for other purposes; ask your health care provider or pharmacist if you have questions. COMMON BRAND NAME(S): Fluarix, Fluzone What should I tell my health care provider before I take this medicine? They need to know if you have any of these conditions: -bleeding disorder like hemophilia -fever or infection -Guillain-Barre syndrome or other neurological problems -immune system problems -infection with the human immunodeficiency virus (HIV) or AIDS -low  blood platelet counts -multiple sclerosis -an unusual or allergic reaction to influenza virus vaccine, eggs, chicken proteins, latex, gentamicin, other medicines, foods, dyes or preservatives -pregnant or trying to get pregnant -breast-feeding How should I use this medicine? This vaccine is for injection into a muscle. It is given by a health care professional. A copy of Vaccine Information Statements will be given before each vaccination. Read this sheet carefully each time. The sheet may change frequently. Talk to your pediatrician regarding the use of this medicine in children. Special care may be needed. Overdosage: If you think you have taken too much of this medicine contact a poison control center or emergency room at once. NOTE: This medicine is only for you. Do not share this medicine with others. What if I miss a dose? This does not apply. What may interact with this medicine? -chemotherapy or radiation therapy -medicines that lower your immune system like etanercept, anakinra, infliximab, and adalimumab -medicines that treat or prevent blood clots like warfarin -phenytoin -steroid medicines like prednisone or cortisone -theophylline -vaccines This list may not describe all possible interactions. Give your health care provider a list of all the medicines, herbs, non-prescription drugs, or dietary supplements you use. Also tell them if you smoke, drink alcohol, or use illegal drugs. Some items may interact with your medicine. What should I watch for while using this medicine? Report any side effects that do not go away within 3 days to your doctor or health care professional. Call your health care provider if any  unusual symptoms occur within 6 weeks of receiving this vaccine. You may still catch the flu, but the illness is not usually as bad. You cannot get the flu from the vaccine. The vaccine will not protect against colds or other illnesses that may cause fever. The vaccine is  needed every year. What side effects may I notice from receiving this medicine? Side effects that you should report to your doctor or health care professional as soon as possible: -allergic reactions like skin rash, itching or hives, swelling of the face, lips, or tongue Side effects that usually do not require medical attention (report to your doctor or health care professional if they continue or are bothersome): -fever -headache -muscle aches and pains -pain, tenderness, redness, or swelling at site where injected -weak or tired This list may not describe all possible side effects. Call your doctor for medical advice about side effects. You may report side effects to FDA at 1-800-FDA-1088. Where should I keep my medicine? This vaccine is only given in a clinic, pharmacy, doctor's office, or other health care setting and will not be stored at home. NOTE: This sheet is a summary. It may not cover all possible information. If you have questions about this medicine, talk to your doctor, pharmacist, or health care provider.  2018 Elsevier/Gold Standard (2008-01-13 09:30:40)

## 2017-05-06 NOTE — Progress Notes (Signed)
Patient ID: Melinda [name], female    DOB: 02/12/1966  MRN: [id number]  CC: No chief complaint on file.   Subjective: Melinda [name] is a 51 y.o. 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  c/o Pain in lower back and buttocks x 1 mth Keeps her awake at nights -not able to stand too long even to cook. Using toaster oven.  She is independent in her other ADLs -a chore to walk to mailbox back to house -missed chair and fell a few wks ago. Ambulates with cane.  Has a mild deformity of the right leg and foot.  Right foot is turned inward and leg is smaller compared to the left.  Obesity: -eating more. Gain 26 lbs since last visit.  -feels Megace and depression playing a role. Been on Megace since July 2018.    Asthma: using Advair, ProAir, Flonase and neb. -using neb few times a wk, Advair BID and ProAir 2-3 x a wk -+SOB with walking -+ LE edema >LT.   Depression -sees psychiatrist Q 3 mth and theraptist once a wk. Goes to [facility] On Celexa, Elavil and Prazosin. Celexa inc to 20 mg recently  4. Migraine: Followed by neurology Dr. Jodean Lima.  Recently had MRA to evaluate for aneurysm because of a thunderclap headache.  MRI showed questionable small aneurysm.  Plan is to repeat study in 1 year  5. DM: checking BS BID. Range BS in the 100s. Only one time in 200. Has appt with Dr. Buddy Duty 05/28/2017  Patient Active Problem List   Diagnosis Date Noted  . Immunization due 05/06/2017  . Left shoulder pain 10/14/2016  . Hypertension 10/14/2016  . S/P thyroidectomy 06/27/2016  . Chronic bilateral low back pain without sciatica 04/22/2016  . Liver lesion 12/24/2015  . Papillary thyroid carcinoma ([place]) 12/21/2015  .  Controlled type 2 diabetes mellitus with complication, with long-term current use of insulin ([place]) 10/10/2015  . Right leg DVT ([place]) 10/10/2015  . Vision loss 08/25/2015  . Decreased vision in both eyes 08/25/2015  . Prolonged Q-T interval on ECG 08/19/2015  . History of pulmonary embolism 08/10/2015  . Moderate persistent asthma 04/13/2015  . Fibroid, uterine   . Morbid obesity ([place]) 11/11/2014  . Sinus tachycardia 11/08/2014  . Migraine 10/14/2014  . Menorrhagia 09/08/2014     Current Outpatient Medications on File Prior to Visit  Medication Sig Dispense Refill  . ACCU-CHEK AVIVA PLUS test strip USE TO TEST BLOOD GLUCOSE FOUR TIMES DAILY BEFORE MEALS AND AT BEDTIME 100 each 12  . albuterol (PROAIR HFA) 108 (90 Base) MCG/ACT inhaler INHALE 2 PUFFS INTO THE LUNGS EVERY 6 HOURS AS NEEDED FOR WHEEZING ORSHORTNESS OF BREATH 8.5 g 1  . albuterol (PROVENTIL) (2.5 MG/3ML) 0.083% nebulizer solution Take 3 mLs (2.5 mg total) by nebulization every 6 (six) hours as needed for wheezing or shortness of breath. 150 mL 1  . amitriptyline (ELAVIL) 50 MG tablet Take 50 mg by mouth at bedtime.    Marland Kitchen apixaban (ELIQUIS) 5 MG TABS tablet 10 mg twice daily for one week, followed by 5 mg twice daily 60 tablet 2  . atorvastatin (LIPITOR) 20 MG tablet TAKE ONE (1) TABLET BY MOUTH EVERY DAY 90 tablet 1  . buPROPion (WELLBUTRIN XL) 150 MG  24 hr tablet Take 150 mg by mouth daily.    . citalopram (CELEXA) 10 MG tablet Take 10 mg by mouth daily.    . diclofenac sodium (VOLTAREN) 1 % GEL Apply 4 g topically 4 (four) times daily. To neck and left shoulder 100 g 5  . ELIQUIS 5 MG TABS tablet TAKE ONE (1) TABLET BY MOUTH TWO (2) TIMES DAILY 60 tablet 11  . ferrous sulfate 325 (65 FE) MG tablet TAKE 1 TABLET BY MOUTH 2 TIMES DAILY WITH A MEAL 60 tablet 11  . fluticasone (CUTIVATE) 0.05 % cream Apply topically 2 (two) times daily. 30 g 2  . fluticasone (FLONASE) 50 MCG/ACT nasal spray Place 2 sprays into both nostrils  daily. 16 g 6  . Fluticasone-Salmeterol (ADVAIR DISKUS) 100-50 MCG/DOSE AEPB Inhale 1 puff into the lungs 2 (two) times daily. 60 each 5  . gabapentin (NEURONTIN) 100 MG capsule TAKE 1 CAPSULE BY MOUTH THREE TIMES A DAY 90 capsule 2  . hydrochlorothiazide (MICROZIDE) 12.5 MG capsule TAKE ONE CAPSULE BY MOUTH DAILY. 30 capsule 2  . insulin glargine (LANTUS) 100 UNIT/ML injection Inject 24 Units into the skin at bedtime.    . insulin lispro (HUMALOG) 100 UNIT/ML injection Inject 6 Units into the skin 3 (three) times daily with meals. 8 U at beginning of meal in V-Go 30, up to 100 units/day     . megestrol (MEGACE) 40 MG tablet Take 3 tablets (120 mg total) by mouth 2 (two) times daily. 180 tablet 6  . metFORMIN (GLUCOPHAGE) 850 MG tablet Take 1 tablet (850 mg total) by mouth 2 (two) times daily with a meal. 180 tablet 3  . methocarbamol (ROBAXIN) 500 MG tablet TAKE 1 TABLET BY MOUTH EVERY 8 HOURS AS NEEDED FOR MUSCLE SPASMS. 40 tablet 3  . metoprolol tartrate (LOPRESSOR) 50 MG tablet TAKE 1 TABLET BY MOUTH 2 TIMES DAILY. 60 tablet 2  . montelukast (SINGULAIR) 10 MG tablet TAKE ONE TABLET BY MOUTH EVERY NIGHT AT BEDTIME 30 tablet 6  . ondansetron (ZOFRAN-ODT) 4 MG disintegrating tablet TAKE ONE TABLET BY MOUTH EVERY EIGHT HOURS AS NEEDED FOR NAUSEA OR VOMITING 20 tablet 0  . prazosin (MINIPRESS) 1 MG capsule Take 1 mg by mouth at bedtime.    . rizatriptan (MAXALT) 10 MG tablet Take 1 tablet earliest onset of headache.  May repeat once after 2 hours if needed 10 tablet 3  . SYNTHROID 200 MCG tablet TAKE 1 TABLET BY MOUTH IN THE MORNING WITH 25 MCG TABLET FOR A TOTAL DOSE OF 225 MCG DAILY 30 tablet 2  . SYNTHROID 25 MCG tablet TAKE ONE TABLET BY MOUTH DAILY WITH 200MCG SYNTHROID TO EQUAL 225MCG DAILY. TAKE BEFORE BREAKFAST (Patient not taking: Reported on 02/03/2017) 30 tablet 2  . topiramate (TOPAMAX) 25 MG tablet TAKE THREE TABLETS BY MOUTH EVERY NIGHT AT BEDTIME 90 tablet 2  . traMADol (ULTRAM) 50 MG  tablet Take 1 tablet (50 mg total) by mouth every 8 (eight) hours as needed. 60 tablet 2   No current facility-administered medications on file prior to visit.     Allergies  Allergen Reactions  . Ceftriaxone Anaphylaxis    ROCEPHIN  . Penicillins Shortness Of Breath    Has patient had a PCN reaction causing immediate rash, facial/tongue/throat swelling, SOB or lightheadedness with hypotension: Yes Has patient had a PCN reaction causing severe rash involving mucus membranes or skin necrosis: No Has patient had a PCN reaction that required hospitalization No Has patient had a  PCN reaction occurring within the last 10 years: No If all of the above answers are "NO", then may proceed with Cephalosporin use.   . Shellfish Allergy Anaphylaxis  . Gold-Containing Drug Products     HANDS ITCH  . Nickel     HANDS SWELL  . [place]rus Rash    Social History   Socioeconomic History  . Marital status: Single    Spouse name: Not on file  . Number of children: Not on file  . Years of education: Not on file  . Highest education level: Not on file  Social Needs  . Financial resource strain: Not on file  . Food insecurity - worry: Not on file  . Food insecurity - inability: Not on file  . Transportation needs - medical: Not on file  . Transportation needs - non-medical: Not on file  Occupational History  . Not on file  Tobacco Use  . Smoking status: Former Smoker    Packs/day: 0.50    Years: 40.00    Pack years: 20.00    Types: Cigarettes    Last attempt to quit: 11/03/2014    Years since quitting: 2.5  . Smokeless tobacco: Never Used  Substance and Sexual Activity  . Alcohol use: No    Alcohol/week: 0.0 oz  . Drug use: No  . Sexual activity: No    Birth control/protection: None  Other Topics Concern  . Not on file  Social History Narrative  . Not on file    Family History  Problem Relation Age of Onset  . Diabetes Mother   . Breast cancer Mother   . CAD Mother   .  Hypertension Mother   . Alcohol abuse Father   . Hypertension Father     Past Surgical History:  Procedure Laterality Date  . ANGIOGRAM TO LEG  08-13-15   RIGHT  . CHOLECYSTECTOMY    . IR GENERIC HISTORICAL  04/04/2016   IR RADIOLOGIST EVAL & MGMT 04/04/2016 [name], MD [facility] INTERV RAD  . UTERINE ARTERY EMBOLIZATION Bilateral 08/13/2015    ROS: Review of Systems Negative except as stated above PHYSICAL EXAM: BP 126/82   Pulse 81   Temp 98.6 F (37 C) (Oral)   Resp 16   Wt (!) 354 lb 12.8 oz (160.9 kg)   SpO2 99%   BMI 50.91 kg/m   Wt Readings from Last 3 Encounters:  05/06/17 (!) 354 lb 12.8 oz (160.9 kg)  04/01/17 (!) 334 lb 3.2 oz (151.6 kg)  02/03/17 (!) 328 lb 9.6 oz (149.1 kg)   Physical Exam General appearance -morbidly obese, middle-aged African-American female in NAD Mental status -oriented and answers questions appropriately eyes -pink conjunctiva Mouth -oral mucosa is moist  Neck - supple, no significant adenopathy Chest - clear to auscultation, no wheezes, rales or rhonchi, symmetric air entry Heart - normal rate, regular rhythm, normal S1, S2, no murmurs, rubs, clicks or gallops Musculoskeletal -she ambulates with a cane.gait a little unsteady without the cane.right foot is rotated inward. LS spine: Mild to moderate tenderness on palpation of the lumbar spine and surrounding paraspinal muscles extremities -trace lower extremity edema  Results for orders placed or performed in visit on 05/06/17  POCT CBG (Fasting - Glucose)  Result Value Ref Range   Glucose Fasting, POC 110 (A) 70 - 99 mg/dL  POCT glycosylated hemoglobin (Hb A1C)  Result Value Ref Range   Hemoglobin A1C 6.0     Lab Results  Component Value Date   WBC  12.4 (H) 02/03/2017   HGB 13.5 02/03/2017   HCT 41.2 02/03/2017   MCV 86 02/03/2017   PLT 389 (H) 02/03/2017     Chemistry      Component Value Date/Time   NA 143 02/03/2017 1028   K 4.1 02/03/2017 1028   CL 111 (H)  02/03/2017 1028   CO2 20 02/03/2017 1028   BUN 12 02/03/2017 1028   CREATININE 0.89 02/03/2017 1028   CREATININE 0.72 06/27/2016 1215      Component Value Date/Time   CALCIUM 9.1 02/03/2017 1028   ALKPHOS 120 (H) 02/03/2017 1028   AST 6 02/03/2017 1028   ALT 13 02/03/2017 1028   BILITOT <0.2 02/03/2017 1028       Depression screen PHQ 2/9 02/03/2017 01/15/2017 11/28/2016 10/14/2016 06/27/2016  Decreased Interest 1 1 1  0 1  Down, Depressed, Hopeless 1 1 1 1 1   PHQ - 2 Score 2 2 2 1 2   Altered sleeping 0 0 1 1 3   Tired, decreased energy 1 1 1 1 3   Change in appetite 1 3 3 1 3   Feeling bad or failure about yourself  0 0 0 0 0  Trouble concentrating 1 3 2 1 2   Moving slowly or fidgety/restless 0 2 2 1 1   Suicidal thoughts 0 0 0 0 0  PHQ-9 Score 5 11 11 6 14   Some recent data might be hidden   ASSESSMENT AND PLAN: 1. Controlled type 2 diabetes mellitus with complication, with long-term current use of insulin (HCC) Continue metformin Patient counseled extensively on healthy eating habits - POCT CBG (Fasting - Glucose) - POCT glycosylated hemoglobin (Hb A1C)  2. Need for influenza vaccination - Flu Vaccine QUAD 6+ mos PF IM (Fluarix Quad PF)  3. Morbid obesity ([place]) -We will have her see gynecology in follow-up to see if she can use something other than Megace since this has increased her appetite with significant weight gain. -counseling given  4. Bilateral low back pain with bilateral sciatica, unspecified chronicity -Likely degenerative disc or degenerative arthritis complicated with mechanical strain of weight Refill given on tramadol  [facility] reviewed - DG Lumbar Spine Complete; Future - Ambulatory referral to Physical Therapy  5. Moderate persistent asthma without complication -Seems to be doing better now that she is taking Advair twice a day   6. Depression, unspecified depression type Plugged into mental health services.  7. Gait disturbance - Ambulatory referral  to Physical Therapy  8. DOE (dyspnea on exertion) - Brain natriuretic peptide  9.Fibroids We will send her back to GYN to see if some other treatment can be used since Megace has caused significant weight gain  -stop Iron - Ambulatory referral to Gynecology  10. Postoperative hypothyroidism - TSH  Patient was given the opportunity to ask questions.  Patient verbalized understanding of the plan and was able to repeat key elements of the plan.   Orders Placed This Encounter  Procedures  . Flu Vaccine QUAD 6+ mos PF IM (Fluarix Quad PF)  . POCT CBG (Fasting - Glucose)  . POCT glycosylated hemoglobin (Hb A1C)     Requested Prescriptions    No prescriptions requested or ordered in this encounter    No Follow-up on file.  [name], MD, FACP

## 2017-05-07 LAB — TSH: TSH: 0.867 u[IU]/mL (ref 0.450–4.500)

## 2017-05-07 LAB — BRAIN NATRIURETIC PEPTIDE: BNP: 12 pg/mL (ref 0.0–100.0)

## 2017-05-13 ENCOUNTER — Other Ambulatory Visit: Payer: Self-pay | Admitting: Internal Medicine

## 2017-05-13 DIAGNOSIS — J4550 Severe persistent asthma, uncomplicated: Secondary | ICD-10-CM

## 2017-05-23 IMAGING — CT CT ANGIO CHEST
2 of 10 series · 19 of 46 positions shown · IV contrast (APPLIED)
Comparison: Chest x-ray on 08/10/2005

CLINICAL DATA: Shortness of breath for 2 weeks. Admitted with
severe vaginal bleeding.

EXAM:
CT ANGIOGRAPHY CHEST WITH CONTRAST
TECHNIQUE: Multidetector CT imaging of the chest was performed using the
standard protocol during bolus administration of intravenous
contrast. Multiplanar CT image reconstructions and MIPs were
obtained to evaluate the vascular anatomy.
CONTRAST:  100mL OMNIPAQUE IOHEXOL 350 MG/ML SOLN

[Series 6: thins · axial · 0.98mm/px · z∈[-299,-26]mm · 16 of 309 slices shown]
[im 18/309  lung]
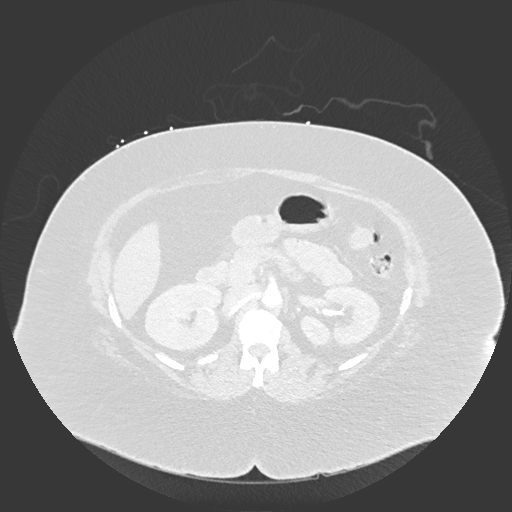
[im 35/309  soft-tissue]
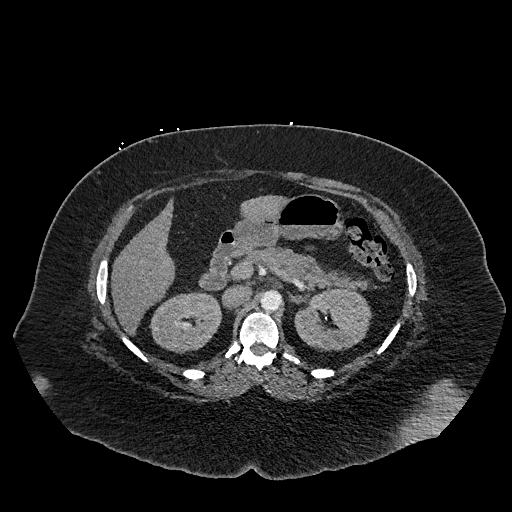
[im 52/309  lung]
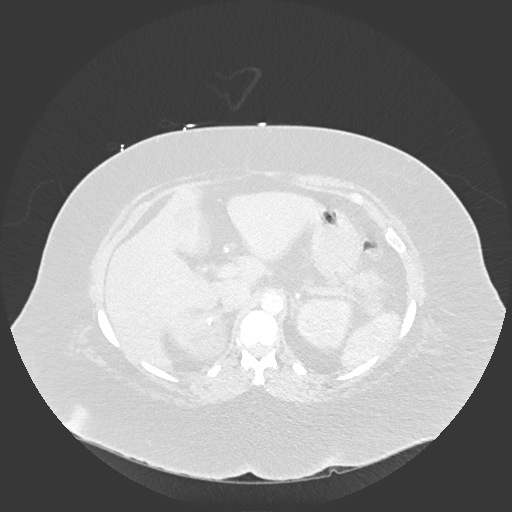
[im 69/309  soft-tissue]
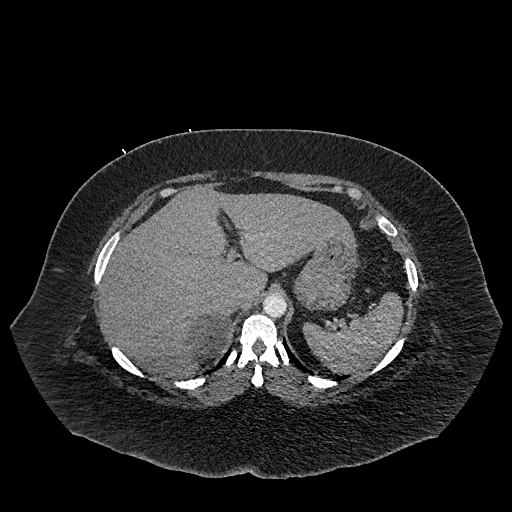
[im 86/309  lung]
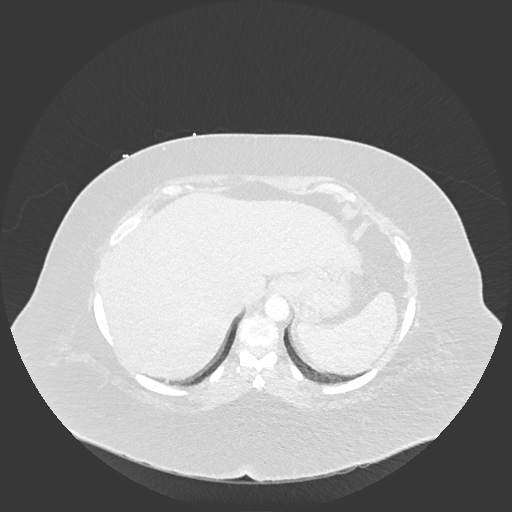
[im 103/309  soft-tissue]
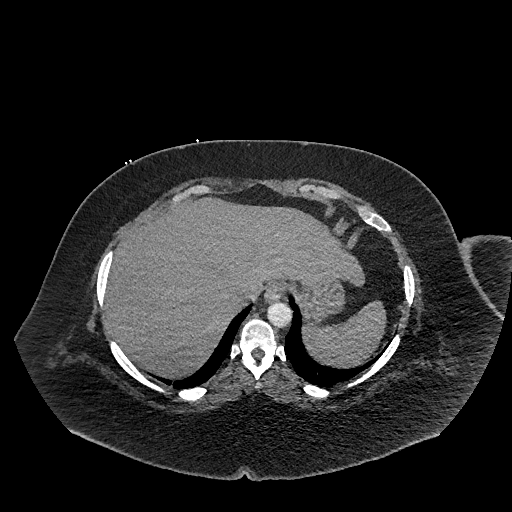
[im 120/309  lung]
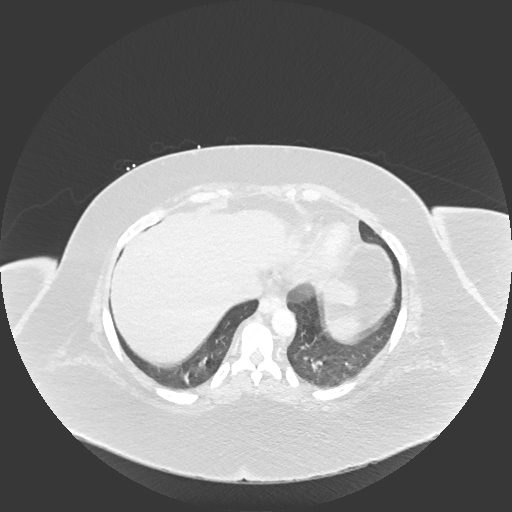
[im 137/309  soft-tissue]
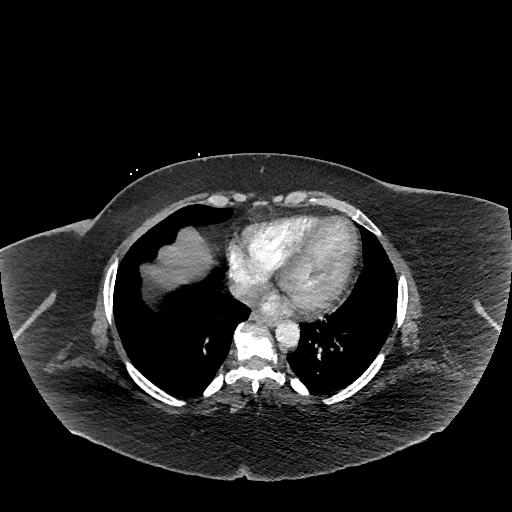
[im 172/309  lung]
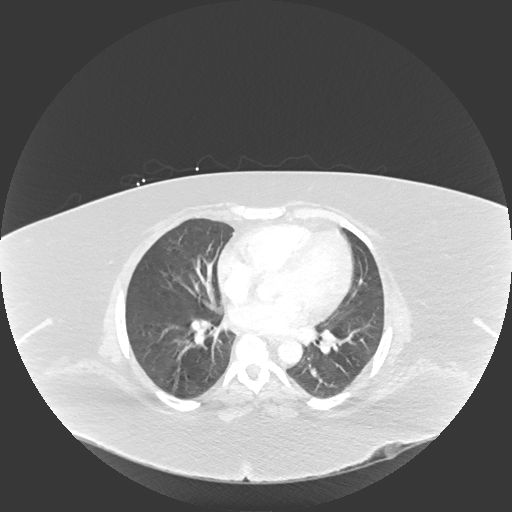
[im 189/309  soft-tissue]
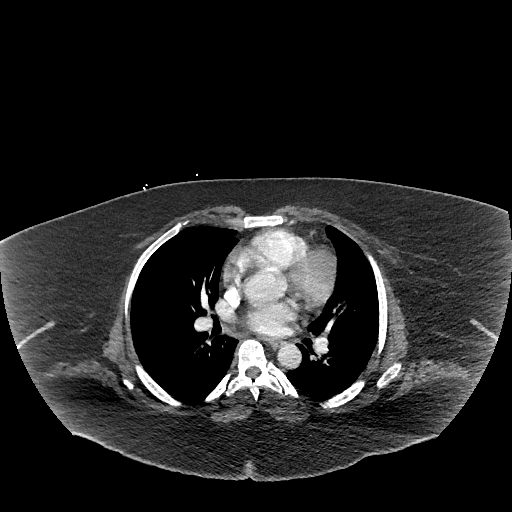
[im 206/309  lung]
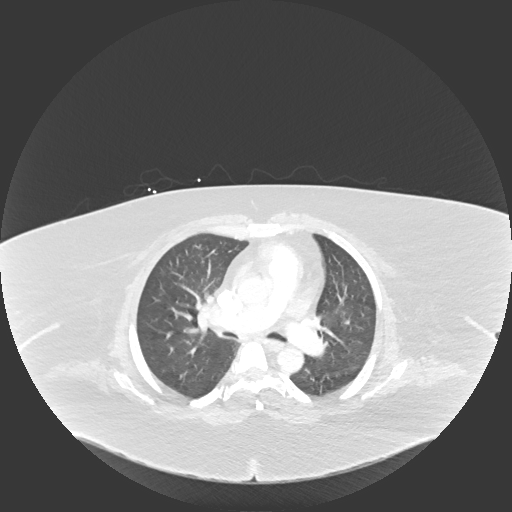
[im 223/309  soft-tissue]
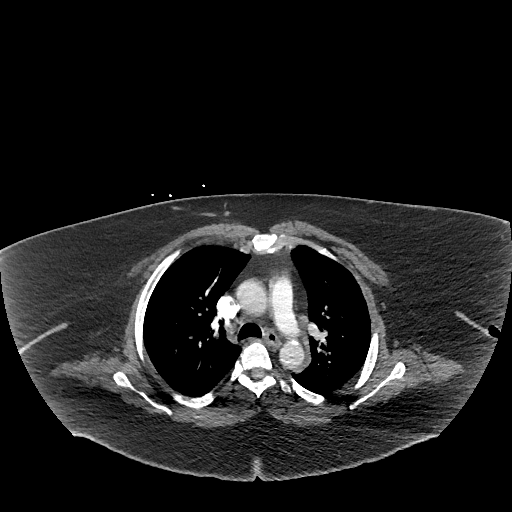
[im 240/309  lung]
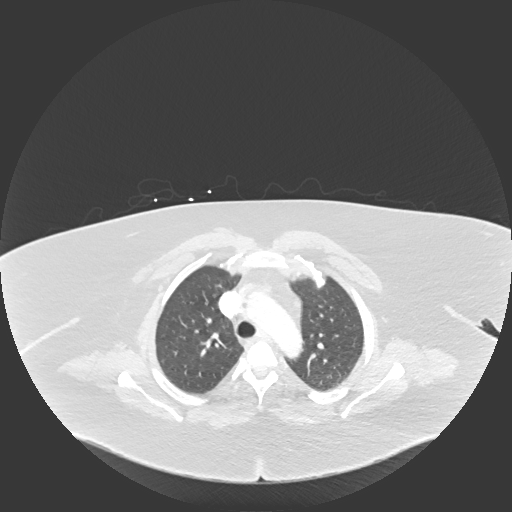
[im 257/309  soft-tissue]
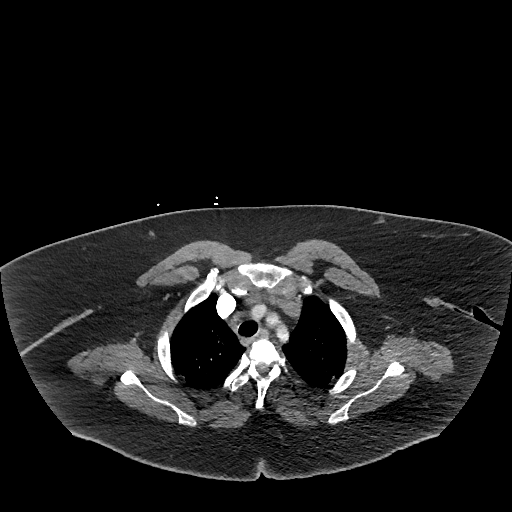
[im 274/309  lung]
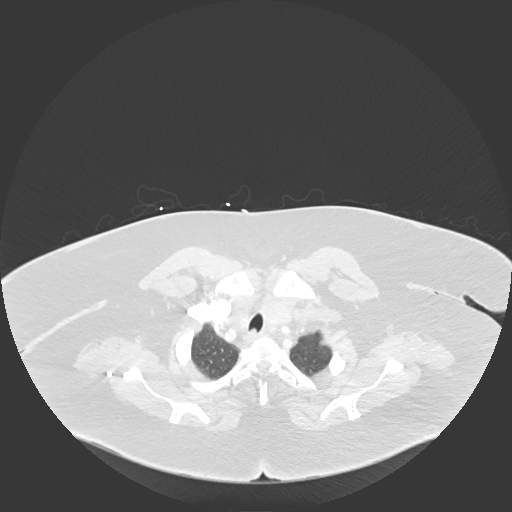
[im 291/309  soft-tissue]
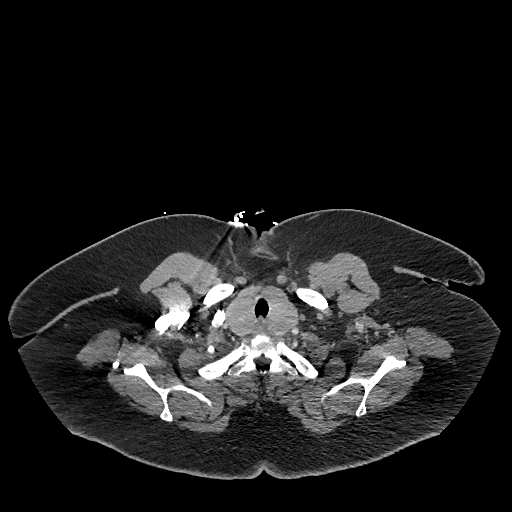

[Series 8: coronal mpr · coronal · 0.61mm/px · 3 of 135 slices shown]
[im 34/135  soft-tissue]
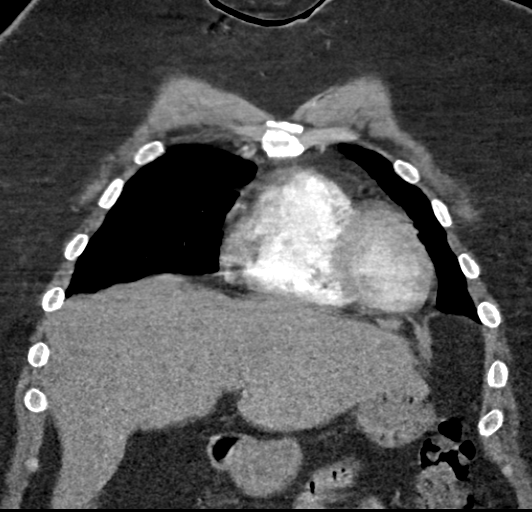
[im 68/135  soft-tissue]
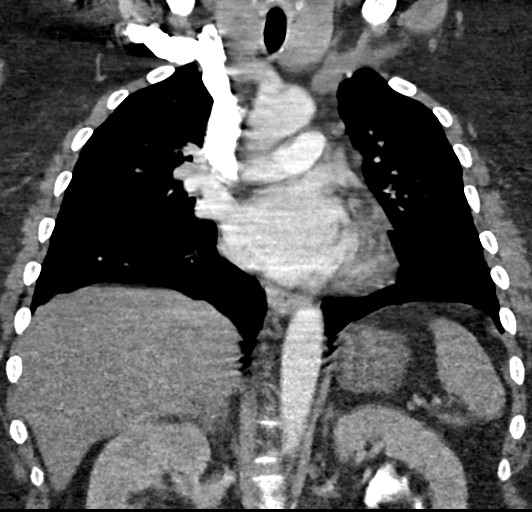
[im 101/135  soft-tissue]
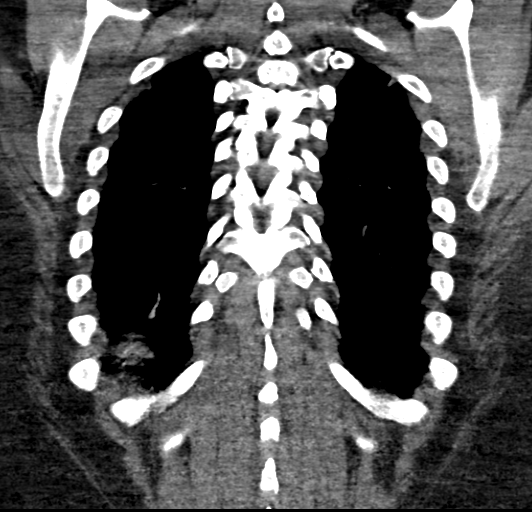

[19 of 46 positions shown; findings below may reference images not displayed]

FINDINGS: The central pulmonary arteries are well opacified. Peripheral branch
artery opacification is somewhat limited due to timing of contrast
bolus and patient body habitus. There is no evidence of pulmonary
embolism. The thoracic aorta is normal in caliber.

The heart size is normal. No pleural or pericardial fluid
identified. Lungs show no evidence of edema, infiltrate or nodule.
Mild scarring/ atelectasis present at both lung bases. Focal
followup of the anterior right upper lobe without pneumothorax.

No lymphadenopathy identified. In the upper abdomen, there is an
incidental mass just superior to the right kidney and likely
originating from the right adrenal gland. This lesion measures
approximately 5.3 x 4.8 x 4.4 cm and contains areas of macroscopic
fat and overall is of low density (-15 to 4 HU) by Hounsfield
measurements. There are some partial calcifications anteriorly
within the lesion. This most likely represents a myelolipoma of the
adrenal gland.

Review of the MIP images confirms the above findings.
IMPRESSION: 1. No evidence of pulmonary embolism or other acute findings in the
chest.
2. Incidental 5 cm mass of the right adrenal gland with low density,
macroscopic internal fat and areas of anterior calcification. The
focal areas of calcification may reflect prior fat necrosis or
previous focal hemorrhage. This likely represents a benign adrenal
myelolipoma.

## 2017-05-27 ENCOUNTER — Ambulatory Visit: Payer: Medicaid Other | Attending: Orthopedic Surgery | Admitting: Physical Therapy

## 2017-05-27 ENCOUNTER — Encounter: Payer: Self-pay | Admitting: Physical Therapy

## 2017-05-27 DIAGNOSIS — R262 Difficulty in walking, not elsewhere classified: Secondary | ICD-10-CM

## 2017-05-27 DIAGNOSIS — G8929 Other chronic pain: Secondary | ICD-10-CM | POA: Diagnosis present

## 2017-05-27 DIAGNOSIS — M545 Low back pain: Secondary | ICD-10-CM | POA: Diagnosis present

## 2017-05-27 NOTE — Therapy (Signed)
[address] [place], [place], [address] Phone: [phone]   Fax:  [phone]  Physical Therapy Evaluation  Patient Details  Name: Melinda [name] MRN: [id number] Date of Birth: March 20, 1966 Referring Provider: [name]   Encounter Date: 05/27/2017  PT End of Session - 05/27/17 1104    Visit Number  1    Authorization Type  Medicaid, will ask for 3 visits    PT Start Time  1000    PT Stop Time  1100    PT Time Calculation (min)  60 min    Activity Tolerance  Patient limited by pain    Behavior During Therapy  [facility] for tasks assessed/performed       Past Medical History:  Diagnosis Date  . ADHD (attention deficit hyperactivity disorder)   . Arthritis   . Asthma   . Cancer ([place])   . Chicken pox AGE 51  . Diabetes mellitus without complication ([place])   . GERD (gastroesophageal reflux disease)   . Glaucoma    BOTH EYES, NO EYE DROPS  . Glaucoma   . History of blood transfusion MARCH 2016    2UNITS GIVEN AND IRON GIVEN  . Hyperglycemia 09/09/2014  . Hypertension   . 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 Iron deficiency anemia due to chronic blood loss 09/09/2014  . Low TSH level 09/08/2014  . Lumbar herniated disc   . Menorrhagia 09/08/2014  . Migraine    CLUSTER AND MIGRAINES  . Multiple thyroid nodules   . Ovarian mass, right 09/09/2014  . PE (pulmonary embolism) 2016  . Peripheral neuropathy    FINGER TIPS AND TOES NUMB SOME  . Preeclampsia  1983   WITH PREGNANCY  . PTSD (post-traumatic stress disorder)   . Scoliosis   . Seizures ([place]) AGE 51   NONE SINCE, HAD CHICKEN POX THEN    Past Surgical History:  Procedure Laterality Date  . ANGIOGRAM TO LEG  08-13-15   RIGHT  . CHOLECYSTECTOMY    . IR GENERIC HISTORICAL  04/04/2016   IR RADIOLOGIST EVAL & MGMT 04/04/2016 [name], MD [facility] INTERV RAD  . THYROIDECTOMY  N/A 11/17/2015   Procedure: TOTAL THYROIDECTOMY;  Surgeon: Armandina Gemma, MD;  Location: [facility];  Service: General;  Laterality: N/A;  . UTERINE ARTERY EMBOLIZATION Bilateral 08/13/2015    There were no vitals filed for this visit.   Subjective Assessment - 05/27/17 1000    Subjective  We saw the pateint earlier int he year for frozen shoulder, she reports that the HEP we gave her has helped.  She reports that she is back due to low back pain.  She reports an injury as a child.  She is on disability due to the back issues.  X-ray two years ago show DDD and spurs at L5-S1.  She reports that usually once a day she will have her legs go numb and have difficulty walking    Limitations  Standing;Walking;House hold activities    How long can you stand comfortably?  2 minutes max due to pain    How long can you walk comfortably?  100 feet and will have to stop and sit    Currently in Pain?  Yes    Pain Score  2     Pain Location  Back    Pain Orientation  Lower  Pain Descriptors / Indicators  Aching;Tingling;Numbness    Pain Type  Chronic pain    Pain Radiating Towards  numbness in the legs at times, reports mostly hip to knee    Pain Onset  More than a month ago    Pain Frequency  Constant    Aggravating Factors   standing, walking, bending pain up to 10/10    Pain Relieving Factors  rest pain can go down to a 2-3/10    Effect of Pain on Daily Activities  difficulkty with any standing, walking, ADL's         [facility] PT Assessment - 05/27/17 0001      Assessment   Medical Diagnosis  Low back pain    Referring Provider  [name]    Onset Date/Surgical Date  04/26/17    Hand Dominance  Right    Prior Therapy  for left frozen shoulder earlier in the year      Precautions   Precautions  None      Balance Screen   Has the patient fallen in the past 6 months  No    Has the patient had a decrease in activity level because of a fear of falling?   No    Is the patient reluctant to  leave their home because of a fear of falling?   No      Home Environment   Additional Comments  does housework, some steps into the home      Prior Function   Level of Independence  Independent with household mobility with device    Vocation  On disability    Leisure  no exercise      Posture/Postural Control   Posture Comments  fwd head, rounded shoulders      AROM   Overall AROM Comments  lumbar ROM decreased 75% with pain      Strength   Overall Strength Comments  LE strength is 4-/5      Flexibility   Soft Tissue Assessment /Muscle Length  yes    Hamstrings  + SLR on the right at 50 degrees      Palpation   Palpation comment  none tender in the low back, tightness is palpable, wears a brace on the right ankle reports due to her foot turning in      Ambulation/Gait   Gait Comments  uses a SPC, slow, antalgic on the right, 100 feet max then has to rest             Objective measurements completed on examination: See above findings.              PT Education - 05/27/17 1103    Education provided  Yes    Education Details  Lumbar flexion exercises    Person(s) Educated  Patient    Methods  Explanation;Demonstration;Handout    Comprehension  Verbalized understanding       PT Short Term Goals - 05/27/17 1107      PT SHORT TERM GOAL #1   Title  independent with HEP    Time  2    Period  Weeks    Status  New        PT Long Term Goals - 05/27/17 1111      PT LONG TERM GOAL #1   Title  decrease pain 25%    Time  8    Period  Weeks    Status  New      PT  LONG TERM GOAL #2   Title  increase lumbar ROM 25%    Time  8    Period  Weeks    Status  New      PT LONG TERM GOAL #3   Title  tolerate 5 minutes of standing    Time  8    Period  Weeks    Status  New      PT LONG TERM GOAL #4   Title  walk 300 feet without stopping    Time  8    Period  Weeks    Status  New             Plan - 05/27/17 1104    Clinical Impression  Statement  Patient reports pain in the low back from an accident when she was 51 years old, has been on disability due to the back pain since she was 51 years old.  She has a tolerance of 2 minutes stnading and about 100 feet of walking before she ahs to sit due to pain.  Her lumbar ROM is decreased 75% for all motoins.  X-rays show DDD and spurs at L5-S1    Clinical Presentation  Stable    Clinical Decision Making  Low    Clinical Impairments Affecting Rehab Potential  Medicaid    PT Frequency  1x / week    PT Duration  4 weeks    PT Treatment/Interventions  ADLs/Self Care Home Management;Cryotherapy;Electrical Stimulation;Moist Heat;Traction;Functional mobility training;Therapeutic activities;Therapeutic exercise;Manual techniques    PT Next Visit Plan  will ask for visits, try core and motions exercises    Consulted and Agree with Plan of Care  Patient       Patient will benefit from skilled therapeutic intervention in order to improve the following deficits and impairments:  Abnormal gait, Decreased range of motion, Difficulty walking, Obesity, Increased muscle spasms, Decreased endurance, Cardiopulmonary status limiting activity, Decreased activity tolerance, Pain, Impaired flexibility, Improper body mechanics, Decreased strength, Postural dysfunction  Visit Diagnosis: Chronic bilateral low back pain without sciatica - Plan: PT plan of care cert/re-cert  Difficulty in walking, not elsewhere classified - Plan: PT plan of care cert/re-cert     Problem List Patient Active Problem List   Diagnosis Date Noted  . Immunization due 05/06/2017  . Left shoulder pain 10/14/2016  . Hypertension 10/14/2016  . S/P thyroidectomy 06/27/2016  . Chronic bilateral low back pain without sciatica 04/22/2016  . Liver lesion 12/24/2015  . Papillary thyroid carcinoma ([place]) 12/21/2015  . Controlled type 2 diabetes mellitus with complication, with long-term current use of insulin ([place]) 10/10/2015  . Right  leg DVT ([place]) 10/10/2015  . Vision loss 08/25/2015  . Decreased vision in both eyes 08/25/2015  . Prolonged Q-T interval on ECG 08/19/2015  . History of pulmonary embolism 08/10/2015  . Moderate persistent asthma 04/13/2015  . Fibroid, uterine   . Morbid obesity ([place]) 11/11/2014  . Sinus tachycardia 11/08/2014  . Migraine 10/14/2014  . Menorrhagia 09/08/2014    Sumner Boast., PT 05/27/2017, 11:51 AM  [facility] [address] [place], [place], [address] Phone: [phone]   Fax:  [phone]  Name: Bobetta Korf MRN: [id number] Date of Birth: Oct 20, 1965

## 2017-05-29 ENCOUNTER — Other Ambulatory Visit: Payer: Self-pay | Admitting: Pharmacist

## 2017-05-29 ENCOUNTER — Other Ambulatory Visit: Payer: Self-pay | Admitting: Internal Medicine

## 2017-05-29 DIAGNOSIS — M545 Low back pain: Principal | ICD-10-CM

## 2017-05-29 DIAGNOSIS — G8929 Other chronic pain: Secondary | ICD-10-CM

## 2017-05-29 MED ORDER — APIXABAN 5 MG PO TABS: ORAL_TABLET | ORAL | 0 refills | Status: DC

## 2017-06-05 ENCOUNTER — Ambulatory Visit: Payer: Medicaid Other | Attending: Orthopedic Surgery | Admitting: Physical Therapy

## 2017-06-05 ENCOUNTER — Encounter: Payer: Self-pay | Admitting: Physical Therapy

## 2017-06-05 DIAGNOSIS — G8929 Other chronic pain: Secondary | ICD-10-CM | POA: Diagnosis present

## 2017-06-05 DIAGNOSIS — R262 Difficulty in walking, not elsewhere classified: Secondary | ICD-10-CM | POA: Diagnosis present

## 2017-06-05 DIAGNOSIS — M545 Low back pain: Secondary | ICD-10-CM | POA: Diagnosis not present

## 2017-06-05 NOTE — Therapy (Signed)
[address] [place], [place], [address] Phone: [phone]   Fax:  [phone]  Physical Therapy Treatment  Patient Details  Name: Melinda [name] MRN: [id number] Date of Birth: 01/01/1966 Referring Provider: [name]   Encounter Date: 06/05/2017  PT End of Session - 06/05/17 1422    Visit Number  2    PT Start Time  1400    PT Stop Time  1445    PT Time Calculation (min)  45 min       Past Medical History:  Diagnosis Date  . ADHD (attention deficit hyperactivity disorder)   . Arthritis   . Asthma   . Cancer ([address])   . Chicken pox AGE 3  . Diabetes mellitus without complication ([place])   . GERD (gastroesophageal reflux disease)   . Glaucoma    BOTH EYES, NO EYE DROPS  . Glaucoma   . History of blood transfusion MARCH 2016    2UNITS GIVEN AND IRON GIVEN  . Hyperglycemia 09/09/2014  . Hypertension   . 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 Iron deficiency anemia due to chronic blood loss 09/09/2014  . Low TSH level 09/08/2014  . Lumbar herniated disc   . Menorrhagia 09/08/2014  . Migraine    CLUSTER AND MIGRAINES  . Multiple thyroid nodules   . Ovarian mass, right 09/09/2014  . PE (pulmonary embolism) 2016  . Peripheral neuropathy    FINGER TIPS AND TOES NUMB SOME  . Preeclampsia  1983   WITH PREGNANCY  . PTSD (post-traumatic stress disorder)   . Scoliosis   . Seizures ([place]) AGE 22   NONE SINCE, HAD CHICKEN POX THEN    Past Surgical History:  Procedure Laterality Date  . ANGIOGRAM TO LEG  08-13-15   RIGHT  . CHOLECYSTECTOMY    . IR GENERIC HISTORICAL  04/04/2016   IR RADIOLOGIST EVAL & MGMT 04/04/2016 [name], MD [facility] INTERV RAD  . THYROIDECTOMY N/A 11/17/2015   Procedure: TOTAL THYROIDECTOMY;  Surgeon: Armandina Gemma, MD;  Location: [facility];  Service: General;  Laterality: N/A;  . UTERINE ARTERY EMBOLIZATION  Bilateral 08/13/2015    There were no vitals filed for this visit.  Subjective Assessment - 06/05/17 1359    Subjective  doing stretches and they maybe are helping    Currently in Pain?  Yes    Pain Score  5     Pain Location  Back                      [facility] Adult PT Treatment/Exercise - 06/05/17 0001      Exercises   Exercises  Lumbar      Lumbar Exercises: Standing   Other Standing Lumbar Exercises  hip ext and abd 10 each      Lumbar Exercises: Seated   Long Arc Quad on Chair  Both;10 reps;Strengthening    Other Seated Lumbar Exercises  hip flex and abd 10 each      Lumbar Exercises: Supine   Ab Set  10 reps    Clam  10 reps    Bent Knee Raise  10 reps    Bridge  10 reps switched to PPT after 2 reps    Straight Leg Raise  10 reps      Modalities   Modalities  Moist Heat;Electrical Stimulation  Moist Heat Therapy   Number Minutes Moist Heat  15 Minutes    Moist Heat Location  Lumbar Spine      Electrical Stimulation   Electrical Stimulation Location  LB    Electrical Stimulation Action  IFC    Electrical Stimulation Parameters  seated    Electrical Stimulation Goals  Pain             PT Education - 06/05/17 1420    Education provided  Yes    Education Details  supine and seated ther ex as documented in tx. TENS info given    Person(s) Educated  Patient    Methods  Explanation;Demonstration;Handout    Comprehension  Verbalized understanding;Returned demonstration       PT Short Term Goals - 06/05/17 1421      PT SHORT TERM GOAL #1   Title  independent with HEP    Status  Achieved        PT Long Term Goals - 05/27/17 1111      PT LONG TERM GOAL #1   Title  decrease pain 25%    Time  8    Period  Weeks    Status  New      PT LONG TERM GOAL #2   Title  increase lumbar ROM 25%    Time  8    Period  Weeks    Status  New      PT LONG TERM GOAL #3   Title  tolerate 5 minutes of standing    Time  8    Period  Weeks     Status  New      PT LONG TERM GOAL #4   Title  walk 300 feet without stopping    Time  8    Period  Weeks    Status  New            Plan - 06/05/17 1422    Clinical Impression Statement  est HEP for LE and core strength as pt is very weak, so ex modified to 5 reps at a time and cuing to breath-STG met    PT Treatment/Interventions  ADLs/Self Care Home Management;Cryotherapy;Electrical Stimulation;Moist Heat;Traction;Functional mobility training;Therapeutic activities;Therapeutic exercise;Manual techniques    PT Next Visit Plan  assess HEP and pain and progress       Patient will benefit from skilled therapeutic intervention in order to improve the following deficits and impairments:  Abnormal gait, Decreased range of motion, Difficulty walking, Obesity, Increased muscle spasms, Decreased endurance, Cardiopulmonary status limiting activity, Decreased activity tolerance, Pain, Impaired flexibility, Improper body mechanics, Decreased strength, Postural dysfunction  Visit Diagnosis: Chronic bilateral low back pain without sciatica  Difficulty in walking, not elsewhere classified     Problem List Patient Active Problem List   Diagnosis Date Noted  . Immunization due 05/06/2017  . Left shoulder pain 10/14/2016  . Hypertension 10/14/2016  . S/P thyroidectomy 06/27/2016  . Chronic bilateral low back pain without sciatica 04/22/2016  . Liver lesion 12/24/2015  . Papillary thyroid carcinoma ([place]) 12/21/2015  . Controlled type 2 diabetes mellitus with complication, with long-term current use of insulin ([place]) 10/10/2015  . Right leg DVT ([place]) 10/10/2015  . Vision loss 08/25/2015  . Decreased vision in both eyes 08/25/2015  . Prolonged Q-T interval on ECG 08/19/2015  . History of pulmonary embolism 08/10/2015  . Moderate persistent asthma 04/13/2015  . Fibroid, uterine   . Morbid obesity ([place]) 11/11/2014  . Sinus tachycardia 11/08/2014  .  Migraine 10/14/2014  . Menorrhagia  09/08/2014    Anayansi Rundquist,ANGIE PTA 06/05/2017, 2:23 PM  [place] [address] [place], [place], [address] Phone: [phone]   Fax:  [phone]  Name: Melinda [name] MRN: [id number] Date of Birth: 1965-09-28

## 2017-06-11 ENCOUNTER — Ambulatory Visit: Payer: Medicaid Other | Admitting: Physical Therapy

## 2017-06-13 ENCOUNTER — Other Ambulatory Visit: Payer: Self-pay | Admitting: Internal Medicine

## 2017-06-13 ENCOUNTER — Ambulatory Visit: Payer: Medicaid Other | Admitting: Physical Therapy

## 2017-06-13 DIAGNOSIS — M545 Low back pain, unspecified: Secondary | ICD-10-CM

## 2017-06-13 DIAGNOSIS — R262 Difficulty in walking, not elsewhere classified: Secondary | ICD-10-CM

## 2017-06-13 DIAGNOSIS — G8929 Other chronic pain: Secondary | ICD-10-CM

## 2017-06-13 NOTE — Therapy (Signed)
[address] [place], [place], [address] Phone: [phone]   Fax:  [phone]  Physical Therapy Treatment  Patient Details  Name: Melinda [name] MRN: [id number] Date of Birth: Nov 25, 1965 Referring Provider: [name]   Encounter Date: 06/13/2017  PT End of Session - 06/13/17 5537    Visit Number  3    Authorization Type  Medicaid, will ask for 3 visits    PT Start Time  0800    PT Stop Time  4827    PT Time Calculation (min)  55 min       Past Medical History:  Diagnosis Date  . ADHD (attention deficit hyperactivity disorder)   . Arthritis   . Asthma   . Cancer ([place])   . Chicken pox AGE 89  . Diabetes mellitus without complication ([place])   . GERD (gastroesophageal reflux disease)   . Glaucoma    BOTH EYES, NO EYE DROPS  . Glaucoma   . History of blood transfusion MARCH 2016    2UNITS GIVEN AND IRON GIVEN  . Hyperglycemia 09/09/2014  . Hypertension   . 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 Iron deficiency anemia due to chronic blood loss 09/09/2014  . Low TSH level 09/08/2014  . Lumbar herniated disc   . Menorrhagia 09/08/2014  . Migraine    CLUSTER AND MIGRAINES  . Multiple thyroid nodules   . Ovarian mass, right 09/09/2014  . PE (pulmonary embolism) 2016  . Peripheral neuropathy    FINGER TIPS AND TOES NUMB SOME  . Preeclampsia  1983   WITH PREGNANCY  . PTSD (post-traumatic stress disorder)   . Scoliosis   . Seizures ([place]) AGE 89   NONE SINCE, HAD CHICKEN POX THEN    Past Surgical History:  Procedure Laterality Date  . ANGIOGRAM TO LEG  08-13-15   RIGHT  . CHOLECYSTECTOMY    . IR GENERIC HISTORICAL  04/04/2016   IR RADIOLOGIST EVAL & MGMT 04/04/2016 [name], MD [facility] INTERV RAD  . THYROIDECTOMY N/A 11/17/2015   Procedure: TOTAL THYROIDECTOMY;  Surgeon: Armandina Gemma, MD;  [facility];  Service:  General;  Laterality: N/A;  . UTERINE ARTERY EMBOLIZATION Bilateral 08/13/2015    There were no vitals filed for this visit.  Subjective Assessment - 06/13/17 0834    Subjective  doing HEP with some difficulty d/t out of muscle relaxers- they were helping more than I thought. TENS on its way    Currently in Pain?  Yes    Pain Score  5     Pain Location  Back                      [facility] Adult PT Treatment/Exercise - 06/13/17 0001      Lumbar Exercises: Aerobic   Stationary Bike  Nustep L 2 6 min      Lumbar Exercises: Standing   Heel Raises  10 reps    Other Standing Lumbar Exercises  hip ext and abd 10 each      Lumbar Exercises: Seated   Other Seated Lumbar Exercises  sit fit pelvic ROM 10 each, yellow tband scap stab on sit fot 10 each way LAQ, hip flex and hip abd on sit fit 10 each    Other Seated Lumbar Exercises  isometric ab with ball 10 each wt ball obl 10  each      Modalities   Modalities  Moist Heat;Electrical Stimulation      Moist Heat Therapy   Number Minutes Moist Heat  15 Minutes    Moist Heat Location  Lumbar Spine      Electrical Stimulation   Electrical Stimulation Location  LB    Electrical Stimulation Action  IFC    Electrical Stimulation Parameters  seated    Electrical Stimulation Goals  Pain               PT Short Term Goals - 06/13/17 0838      PT SHORT TERM GOAL #1   Title  independent with HEP    Status  Achieved        PT Long Term Goals - 06/13/17 0802      PT LONG TERM GOAL #1   Title  decrease pain 25%    Status  On-going      PT LONG TERM GOAL #2   Title  increase lumbar ROM 25%    Baseline  same as eval    Status  On-going      PT LONG TERM GOAL #3   Title  tolerate 5 minutes of standing    Baseline  2-3 min    Status  Partially Met      PT LONG TERM GOAL #4   Title  walk 300 feet without stopping    Status  Partially Met            Plan - 06/13/17 0839    Clinical Impression Statement   STG met, slow progression with LTGs- increased func mvmt and ex tolerance vs last session.     PT Treatment/Interventions  ADLs/Self Care Home Management;Cryotherapy;Electrical Stimulation;Moist Heat;Traction;Functional mobility training;Therapeutic activities;Therapeutic exercise;Manual techniques    PT Next Visit Plan  assess TENS use and D/C with HEP       Patient will benefit from skilled therapeutic intervention in order to improve the following deficits and impairments:  Abnormal gait, Decreased range of motion, Difficulty walking, Obesity, Increased muscle spasms, Decreased endurance, Cardiopulmonary status limiting activity, Decreased activity tolerance, Pain, Impaired flexibility, Improper body mechanics, Decreased strength, Postural dysfunction  Visit Diagnosis: Chronic bilateral low back pain without sciatica  Difficulty in walking, not elsewhere classified     Problem List Patient Active Problem List   Diagnosis Date Noted  . Immunization due 05/06/2017  . Left shoulder pain 10/14/2016  . Hypertension 10/14/2016  . S/P thyroidectomy 06/27/2016  . Chronic bilateral low back pain without sciatica 04/22/2016  . Liver lesion 12/24/2015  . Papillary thyroid carcinoma ([place]) 12/21/2015  . Controlled type 2 diabetes mellitus with complication, with long-term current use of insulin ([place]) 10/10/2015  . Right leg DVT ([place]) 10/10/2015  . Vision loss 08/25/2015  . Decreased vision in both eyes 08/25/2015  . Prolonged Q-T interval on ECG 08/19/2015  . History of pulmonary embolism 08/10/2015  . Moderate persistent asthma 04/13/2015  . Fibroid, uterine   . Morbid obesity ([place]) 11/11/2014  . Sinus tachycardia 11/08/2014  . Migraine 10/14/2014  . Menorrhagia 09/08/2014    Makesha Belitz,ANGIE PTA 06/13/2017, 8:41 AM  [address] [place], [place], [address] Phone: [phone]   Fax:  [phone]  Name:  Melinda [name] MRN: [id number] Date of Birth: 1966/01/10

## 2017-06-16 ENCOUNTER — Encounter: Payer: Self-pay | Admitting: Obstetrics and Gynecology

## 2017-06-16 ENCOUNTER — Ambulatory Visit (INDEPENDENT_AMBULATORY_CARE_PROVIDER_SITE_OTHER): Payer: Medicaid Other | Admitting: Obstetrics and Gynecology

## 2017-06-16 VITALS — BP 127/75 | HR 88 | Ht 70.0 in | Wt 363.6 lb

## 2017-06-16 DIAGNOSIS — N939 Abnormal uterine and vaginal bleeding, unspecified: Secondary | ICD-10-CM | POA: Diagnosis present

## 2017-06-16 NOTE — Progress Notes (Signed)
GYNECOLOGY OFFICE VISIT NOTE  History:  51 y.o. G1P0101 here today for follow up. She is on Megace 120 mg BID. S/p uterine artery embolization 08/2015. Periods decreased after [place] but started again and so she was started back on Megace. She is happy with current regimen but here for follow up as she has gained weight on Megace and PCP wants to see if there is an alternative. Has gained 65 pounds in last 2 years.  Past Medical History:  Diagnosis Date  . ADHD (attention deficit hyperactivity disorder)   . Arthritis   . Asthma   . Cancer ([place])   . Chicken pox AGE 5  . Diabetes mellitus without complication ([place])   . GERD (gastroesophageal reflux disease)   . Glaucoma    BOTH EYES, NO EYE DROPS  . Glaucoma   . History of blood transfusion MARCH 2016    2UNITS GIVEN AND IRON GIVEN  . Hyperglycemia 09/09/2014  . Hypertension   . 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 Iron deficiency anemia due to chronic blood loss 09/09/2014  . Low TSH level 09/08/2014  . Lumbar herniated disc   . Menorrhagia 09/08/2014  . Migraine    CLUSTER AND MIGRAINES  . Multiple thyroid nodules   . Ovarian mass, right 09/09/2014  . PE (pulmonary embolism) 2016  . Peripheral neuropathy    FINGER TIPS AND TOES NUMB SOME  . Preeclampsia  1983   WITH PREGNANCY  . PTSD (post-traumatic stress disorder)   . Scoliosis   . Seizures ([place]) AGE 87   NONE SINCE, HAD CHICKEN POX THEN    Past Surgical History:  Procedure Laterality Date  . ANGIOGRAM TO LEG  08-13-15   RIGHT  . CHOLECYSTECTOMY    . IR GENERIC HISTORICAL  04/04/2016   IR RADIOLOGIST EVAL & MGMT 04/04/2016 [name], MD [facility] INTERV RAD  . THYROIDECTOMY N/A 11/17/2015   Procedure: TOTAL THYROIDECTOMY;  Surgeon: Armandina Gemma, MD;  Location: [facility];  Service: General;  Laterality: N/A;  . UTERINE ARTERY EMBOLIZATION Bilateral 08/13/2015     Current Outpatient Medications:  .   ACCU-CHEK AVIVA PLUS test strip, USE TO TEST BLOOD GLUCOSE FOUR TIMES DAILY BEFORE MEALS AND AT BEDTIME, Disp: 100 each, Rfl: 12 .  albuterol (PROVENTIL) (2.5 MG/3ML) 0.083% nebulizer solution, TAKE 3 MLS (2.5 MG TOTAL) BY NEBULIZATION EVERY 6 HOURS AS NEEDED FORWHEEZING OR SHORTNESS OF BREATH, Disp: 150 mL, Rfl: 1 .  amitriptyline (ELAVIL) 50 MG tablet, Take 50 mg by mouth at bedtime., Disp: , Rfl:  .  apixaban (ELIQUIS) 5 MG TABS tablet, TAKE ONE (1) TABLET BY MOUTH TWO (2) TIMES DAILY, Disp: 60 tablet, Rfl: 0 .  atorvastatin (LIPITOR) 20 MG tablet, TAKE ONE (1) TABLET BY MOUTH EVERY DAY, Disp: 90 tablet, Rfl: 1 .  buPROPion (WELLBUTRIN XL) 150 MG 24 hr tablet, Take 150 mg by mouth daily., Disp: , Rfl:  .  citalopram (CELEXA) 10 MG tablet, Take 10 mg by mouth daily., Disp: , Rfl:  .  diclofenac sodium (VOLTAREN) 1 % GEL, Apply 4 g topically 4 (four) times daily. To neck and left shoulder, Disp: 100 g, Rfl: 5 .  fluticasone (CUTIVATE) 0.05 % cream, Apply topically 2 (two) times daily., Disp: 30 g, Rfl: 2 .  fluticasone (FLONASE) 50 MCG/ACT nasal spray, PLACE 2 SPRAYS INTO BOTH NOSTRILS DAILY, Disp: 16 g, Rfl: 2 .  Fluticasone-Salmeterol (  ADVAIR DISKUS) 100-50 MCG/DOSE AEPB, Inhale 1 puff into the lungs 2 (two) times daily., Disp: 60 each, Rfl: 5 .  gabapentin (NEURONTIN) 100 MG capsule, TAKE 1 CAPSULE BY MOUTH THREE TIMES A DAY, Disp: 90 capsule, Rfl: 2 .  hydrochlorothiazide (MICROZIDE) 12.5 MG capsule, TAKE ONE CAPSULE BY MOUTH DAILY., Disp: 30 capsule, Rfl: 2 .  insulin glargine (LANTUS) 100 UNIT/ML injection, Inject 24 Units into the skin at bedtime., Disp: , Rfl:  .  insulin lispro (HUMALOG) 100 UNIT/ML injection, Inject 6 Units into the skin 3 (three) times daily with meals. 8 U at beginning of meal in V-Go 30, up to 100 units/day , Disp: , Rfl:  .  megestrol (MEGACE) 40 MG tablet, Take 3 tablets (120 mg total) by mouth 2 (two) times daily., Disp: 180 tablet, Rfl: 6 .  metFORMIN (GLUCOPHAGE)  850 MG tablet, Take 1 tablet (850 mg total) by mouth 2 (two) times daily with a meal., Disp: 180 tablet, Rfl: 3 .  methocarbamol (ROBAXIN) 500 MG tablet, TAKE ONE TABLET BY MOUTH EVERY EIGHT HOURS AS NEEDED FOR MUSCLE SPASMS, Disp: 40 tablet, Rfl: 3 .  metoprolol tartrate (LOPRESSOR) 50 MG tablet, TAKE 1 TABLET BY MOUTH 2 TIMES DAILY., Disp: 60 tablet, Rfl: 2 .  montelukast (SINGULAIR) 10 MG tablet, TAKE ONE TABLET BY MOUTH EVERY NIGHT AT BEDTIME, Disp: 30 tablet, Rfl: 6 .  ondansetron (ZOFRAN-ODT) 4 MG disintegrating tablet, TAKE ONE TABLET BY MOUTH EVERY EIGHT HOURS AS NEEDED FOR NAUSEA OR VOMITING, Disp: 20 tablet, Rfl: 0 .  prazosin (MINIPRESS) 1 MG capsule, Take 1 mg by mouth at bedtime., Disp: , Rfl:  .  PROAIR HFA 108 (90 Base) MCG/ACT inhaler, INHALE 2 PUFFS INTO THE LUNGS EVERY 6 HOURS AS NEEDED FOR WHEEZING ORSHORTNESS OF BREATH, Disp: 8.5 g, Rfl: 1 .  rizatriptan (MAXALT) 10 MG tablet, Take 1 tablet earliest onset of headache.  May repeat once after 2 hours if needed, Disp: 10 tablet, Rfl: 3 .  SYNTHROID 200 MCG tablet, TAKE 1 TABLET BY MOUTH IN THE MORNING WITH 25 MCG TABLET FOR A TOTAL DOSE OF 225 MCG DAILY, Disp: 30 tablet, Rfl: 2 .  topiramate (TOPAMAX) 25 MG tablet, TAKE THREE TABLETS BY MOUTH EVERY NIGHT AT BEDTIME, Disp: 90 tablet, Rfl: 2 .  traMADol (ULTRAM) 50 MG tablet, Take 1 tablet (50 mg total) every 8 (eight) hours as needed by mouth., Disp: 60 tablet, Rfl: 2  The following portions of the patient's history were reviewed and updated as appropriate: allergies, current medications, past family history, past medical history, past social history, past surgical history and problem list.   Health Maintenance:  Last pap: negative 03/2016 Last mammogram: 09/2016, normal  Review of Systems:  Pertinent items noted in HPI and remainder of comprehensive ROS otherwise negative.   Objective:  Physical Exam BP 127/75   Pulse 88   Ht 5\' 10"  (1.778 m)   Wt (!) 363 lb 9.6 oz (164.9 kg)    LMP 01/01/2017 (Exact Date)   BMI 52.17 kg/m  CONSTITUTIONAL: Well-developed, well-nourished female in no acute distress.  HENT:  Normocephalic, atraumatic. External right and left ear normal. Oropharynx is clear and moist EYES: Conjunctivae and EOM are normal. Pupils are equal, round, and reactive to light. No scleral icterus.  NECK: Normal range of motion, supple, no masses SKIN: Skin is warm and dry. No rash noted. Not diaphoretic. No erythema. No pallor. NEUROLOGIC: Alert and oriented to person, place, and time. Normal reflexes, muscle tone coordination.  No cranial nerve deficit noted. PSYCHIATRIC: Normal mood and affect. Normal behavior. Normal judgment and thought content. CARDIOVASCULAR: Normal heart rate noted RESPIRATORY: Effort normal, no problems with respiration noted ABDOMEN: Soft, no distention noted.   PELVIC: Deferred MUSCULOSKELETAL: Normal range of motion. No edema noted.  Labs and Imaging No results found.  Assessment & Plan:  1. Abnormal uterine bleeding (AUB) Has been on megace with very good control of bleeding, currently with no vaginal bleeding. PCP referred her here to review options as she has gained ~65 pounds in last two years and PCP believes may be due to megace. Reviewed options for management, she is a poor surgical candidate so would not recommend endometrial ablation/hysterectomy. Reviewed option to stop megace, reduce dose or continue with current dose. She believes that with new diet, she may do better with reducing weight gain and improve weight loss. She does not want to stop megace at this time. Reviewed that only option to see if she has stopped bleeding is to stop megace altogether, however she does not want to do this right now. Will cont current regimen andl see if diet/exercise can control weight gain and BG. Return 3 months, will discuss further options at that point if no improvement in weight loss.   Routine preventative health maintenance  measures emphasized. Please refer to After Visit Summary for other counseling recommendations.   Return in about 3 months (around 09/14/2017).    Feliz Beam, M.D. Attending [place], [facility] for [employer], [place]

## 2017-06-19 ENCOUNTER — Ambulatory Visit: Payer: Medicaid Other | Admitting: Physical Therapy

## 2017-06-19 DIAGNOSIS — M545 Low back pain, unspecified: Secondary | ICD-10-CM

## 2017-06-19 DIAGNOSIS — G8929 Other chronic pain: Secondary | ICD-10-CM

## 2017-06-19 DIAGNOSIS — R262 Difficulty in walking, not elsewhere classified: Secondary | ICD-10-CM

## 2017-06-19 NOTE — Therapy (Signed)
[address] [place], [place], [address] Phone: [phone]   Fax:  [phone]  Physical Therapy Treatment  Patient Details  Name: Melinda [name] MRN: [id number] Date of Birth: 05-15-1966 Referring Provider: [name]   Encounter Date: 06/19/2017  PT End of Session - 06/19/17 1113    Visit Number  4    PT Start Time  1962    PT Stop Time  1130    PT Time Calculation (min)  48 min       Past Medical History:  Diagnosis Date  . ADHD (attention deficit hyperactivity disorder)   . Arthritis   . Asthma   . Cancer ([place])   . Chicken pox AGE 51  . Diabetes mellitus without complication ([place])   . GERD (gastroesophageal reflux disease)   . Glaucoma    BOTH EYES, NO EYE DROPS  . Glaucoma   . History of blood transfusion MARCH 2016    2UNITS GIVEN AND IRON GIVEN  . Hyperglycemia 09/09/2014  . Hypertension   . 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 Iron deficiency anemia due to chronic blood loss 09/09/2014  . Low TSH level 09/08/2014  . Lumbar herniated disc   . Menorrhagia 09/08/2014  . Migraine    CLUSTER AND MIGRAINES  . Multiple thyroid nodules   . Ovarian mass, right 09/09/2014  . PE (pulmonary embolism) 2016  . Peripheral neuropathy    FINGER TIPS AND TOES NUMB SOME  . Preeclampsia  1983   WITH PREGNANCY  . PTSD (post-traumatic stress disorder)   . Scoliosis   . Seizures ([place]) AGE 51   NONE SINCE, HAD CHICKEN POX THEN    Past Surgical History:  Procedure Laterality Date  . ANGIOGRAM TO LEG  08-13-15   RIGHT  . CHOLECYSTECTOMY    . IR GENERIC HISTORICAL  04/04/2016   IR RADIOLOGIST EVAL & MGMT 04/04/2016 [name], MD [facility] INTERV RAD  . THYROIDECTOMY N/A 11/17/2015   Procedure: TOTAL THYROIDECTOMY;  Surgeon: Armandina Gemma, MD;  Location: [facility];  Service: General;  Laterality: N/A;  . UTERINE ARTERY EMBOLIZATION  Bilateral 08/13/2015    There were no vitals filed for this visit.  Subjective Assessment - 06/19/17 1046    Subjective  doing okay, brought TENS. still awaiting muscle relaxers    Currently in Pain?  Yes    Pain Score  7     Pain Location  Back                      [facility] Adult PT Treatment/Exercise - 06/19/17 0001      Self-Care   Self-Care  -- TENS      Lumbar Exercises: Aerobic   Stationary Bike  Nustep L 4 6 min      Lumbar Exercises: Standing   Other Standing Lumbar Exercises  hip ext and abd 10 each      Lumbar Exercises: Seated   Other Seated Lumbar Exercises  yellow tband 3 way 10 each    Other Seated Lumbar Exercises  isometric ab with ball 10 each wt ball abs             PT Education - 06/19/17 1051    Education provided  Yes    Education Details  TENS educ use,application and Psychiatric nurse) Educated  Patient  Methods  Explanation;Demonstration    Comprehension  Verbalized understanding;Returned demonstration       PT Short Term Goals - 06/13/17 4585      PT SHORT TERM GOAL #1   Title  independent with HEP    Status  Achieved        PT Long Term Goals - 06/19/17 1050      PT LONG TERM GOAL #1   Title  decrease pain 25%    Status  On-going      PT LONG TERM GOAL #2   Title  increase lumbar ROM 25%    Baseline  same as eval    Status  On-going      PT LONG TERM GOAL #3   Title  tolerate 5 minutes of standing    Baseline  2-3 min    Status  Partially Met      PT LONG TERM GOAL #4   Title  walk 300 feet without stopping    Baseline  100 feet    Status  Partially Met            Plan - 06/19/17 1114    Clinical Impression Statement  goals partially met. educ on TENS and issued HEP    PT Treatment/Interventions  ADLs/Self Care Home Management;Cryotherapy;Electrical Stimulation;Moist Heat;Traction;Functional mobility training;Therapeutic activities;Therapeutic exercise;Manual techniques    PT Next Visit Plan   D/C       Patient will benefit from skilled therapeutic intervention in order to improve the following deficits and impairments:  Abnormal gait, Decreased range of motion, Difficulty walking, Obesity, Increased muscle spasms, Decreased endurance, Cardiopulmonary status limiting activity, Decreased activity tolerance, Pain, Impaired flexibility, Improper body mechanics, Decreased strength, Postural dysfunction  Visit Diagnosis: Chronic bilateral low back pain without sciatica  Difficulty in walking, not elsewhere classified     Problem List Patient Active Problem List   Diagnosis Date Noted  . Immunization due 05/06/2017  . Left shoulder pain 10/14/2016  . Hypertension 10/14/2016  . S/P thyroidectomy 06/27/2016  . Chronic bilateral low back pain without sciatica 04/22/2016  . Liver lesion 12/24/2015  . Papillary thyroid carcinoma ([place]) 12/21/2015  . Controlled type 2 diabetes mellitus with complication, with long-term current use of insulin ([place]) 10/10/2015  . Right leg DVT ([place]) 10/10/2015  . Vision loss 08/25/2015  . Decreased vision in both eyes 08/25/2015  . Prolonged Q-T interval on ECG 08/19/2015  . History of pulmonary embolism 08/10/2015  . Moderate persistent asthma 04/13/2015  . Fibroid, uterine   . Morbid obesity ([place]) 11/11/2014  . Sinus tachycardia 11/08/2014  . Migraine 10/14/2014  . Menorrhagia 09/08/2014   PHYSICAL THERAPY DISCHARGE SUMMARY   Plan: Patient agrees to discharge.  Patient goals were not met. Patient is being discharged due to financial reasons.  ?????      Hatsue Sime,ANGIE PTA 06/19/2017, 11:15 AM  [address] [place], [place], [address] Phone: [phone]   Fax:  [phone]  Name: Melinda [name] MRN: [id number] Date of Birth: [birth year]

## 2017-06-30 ENCOUNTER — Other Ambulatory Visit: Payer: Self-pay | Admitting: Neurology

## 2017-06-30 ENCOUNTER — Other Ambulatory Visit: Payer: Self-pay | Admitting: Internal Medicine

## 2017-06-30 DIAGNOSIS — Z9089 Acquired absence of other organs: Secondary | ICD-10-CM

## 2017-06-30 DIAGNOSIS — E890 Postprocedural hypothyroidism: Secondary | ICD-10-CM

## 2017-06-30 DIAGNOSIS — I10 Essential (primary) hypertension: Secondary | ICD-10-CM

## 2017-06-30 DIAGNOSIS — Z9889 Other specified postprocedural states: Secondary | ICD-10-CM

## 2017-07-03 ENCOUNTER — Other Ambulatory Visit: Payer: Self-pay | Admitting: Pharmacist

## 2017-07-03 ENCOUNTER — Encounter: Payer: Self-pay | Admitting: Internal Medicine

## 2017-07-03 ENCOUNTER — Ambulatory Visit: Payer: Medicaid Other | Attending: Internal Medicine | Admitting: Internal Medicine

## 2017-07-03 VITALS — BP 128/89 | HR 97 | Temp 98.3°F | Resp 16 | Wt 369.4 lb

## 2017-07-03 DIAGNOSIS — E118 Type 2 diabetes mellitus with unspecified complications: Secondary | ICD-10-CM | POA: Diagnosis not present

## 2017-07-03 DIAGNOSIS — Z79899 Other long term (current) drug therapy: Secondary | ICD-10-CM | POA: Diagnosis not present

## 2017-07-03 DIAGNOSIS — Z803 Family history of malignant neoplasm of breast: Secondary | ICD-10-CM | POA: Diagnosis not present

## 2017-07-03 DIAGNOSIS — M5441 Lumbago with sciatica, right side: Secondary | ICD-10-CM

## 2017-07-03 DIAGNOSIS — R269 Unspecified abnormalities of gait and mobility: Secondary | ICD-10-CM

## 2017-07-03 DIAGNOSIS — F329 Major depressive disorder, single episode, unspecified: Secondary | ICD-10-CM | POA: Diagnosis not present

## 2017-07-03 DIAGNOSIS — E278 Other specified disorders of adrenal gland: Secondary | ICD-10-CM

## 2017-07-03 DIAGNOSIS — Z794 Long term (current) use of insulin: Secondary | ICD-10-CM | POA: Diagnosis not present

## 2017-07-03 DIAGNOSIS — K769 Liver disease, unspecified: Secondary | ICD-10-CM | POA: Diagnosis not present

## 2017-07-03 DIAGNOSIS — Z9889 Other specified postprocedural states: Secondary | ICD-10-CM | POA: Diagnosis not present

## 2017-07-03 DIAGNOSIS — Z880 Allergy status to penicillin: Secondary | ICD-10-CM | POA: Insufficient documentation

## 2017-07-03 DIAGNOSIS — Z91013 Allergy to seafood: Secondary | ICD-10-CM | POA: Insufficient documentation

## 2017-07-03 DIAGNOSIS — Z8249 Family history of ischemic heart disease and other diseases of the circulatory system: Secondary | ICD-10-CM | POA: Insufficient documentation

## 2017-07-03 DIAGNOSIS — Z6841 Body Mass Index (BMI) 40.0 and over, adult: Secondary | ICD-10-CM | POA: Diagnosis not present

## 2017-07-03 DIAGNOSIS — Z833 Family history of diabetes mellitus: Secondary | ICD-10-CM | POA: Diagnosis not present

## 2017-07-03 DIAGNOSIS — Z9049 Acquired absence of other specified parts of digestive tract: Secondary | ICD-10-CM | POA: Diagnosis not present

## 2017-07-03 DIAGNOSIS — E279 Disorder of adrenal gland, unspecified: Secondary | ICD-10-CM

## 2017-07-03 DIAGNOSIS — Z87891 Personal history of nicotine dependence: Secondary | ICD-10-CM | POA: Insufficient documentation

## 2017-07-03 DIAGNOSIS — F32A Depression, unspecified: Secondary | ICD-10-CM

## 2017-07-03 DIAGNOSIS — M5442 Lumbago with sciatica, left side: Secondary | ICD-10-CM | POA: Diagnosis present

## 2017-07-03 DIAGNOSIS — I10 Essential (primary) hypertension: Secondary | ICD-10-CM | POA: Diagnosis not present

## 2017-07-03 DIAGNOSIS — J4550 Severe persistent asthma, uncomplicated: Secondary | ICD-10-CM

## 2017-07-03 DIAGNOSIS — E669 Obesity, unspecified: Secondary | ICD-10-CM | POA: Insufficient documentation

## 2017-07-03 LAB — GLUCOSE, POCT (MANUAL RESULT ENTRY): POC GLUCOSE: 117 mg/dL — AB (ref 70–99)

## 2017-07-03 MED ORDER — FLUTICASONE-SALMETEROL 100-50 MCG/DOSE IN AEPB: 1.0000 | INHALATION_SPRAY | Freq: Two times a day (BID) | RESPIRATORY_TRACT | 2 refills | Status: DC

## 2017-07-03 NOTE — Progress Notes (Signed)
Pt states her pain is coming from her back going down to her mid thigh Pt states the pain has been going on for a while Pt states the pain

## 2017-07-03 NOTE — Progress Notes (Signed)
Patient ID: Melinda [name], female    DOB: October 17, 1965  MRN: [id number]  CC: No chief complaint on file.   Subjective: Melinda [name] is a 52 y.o. female who presents for chronic ds management. Last seen 05/2017 Her concerns today include:  52 year old with history ofDM (followed by Dr. Jana Hakim, moderate persistent asthma,migraines followed by neurology, uterine fibroids followed by GYN, papillary thyroid CA s/pthyroidectomy, history of DVT/PE onEliquis lifetime,obesity and left shoulder pain.  1. Chronic LBP/gait disturbance:  -On last visit I ordered x-ray of the lumbar spine and referred patient to physical therapy.  Tramadol was refilled. -had 3 P.T visits. She found it helpful.  She bought a TENS unit since this was used on her in P.T.  Also doing the home exercises as recommended.  -did not get x-rays due to lack of transportation. -uses medical transportation or taxi to get around -request home health aide. Has problems "doing ever thing." Not able to stand long enough to cook, clean, take a shower. Now has shower chair. Has to "sit and maneuver myself into my clothes." -No falls since last visit.  Ambulates with a cane.  2.  Depression -sees psychiatrist Q 3 mth and theraptist once a wk. Goes to [facility] group On Celexa, Elavil and Prazosin.   3.  DM:  Saw Dr. Buddy Duty in 05/2017. No med changes. A1C was 6.  Meds: compliant with meds Eating habits: eating more frozen foods since she is unable to stand for long periods.   4.  Obesity: She did see her gynecologist about the Megace.  However patient did not want to stop the Megace despite her complaints to me on last visit that it may have contributed to her weight gain.  Patient decided to work on diet and exercise.  She has a follow-up with gynecology again in 3 months.  5.  Had CT scan of abdomen done 2/201 7 that revealed right adrenal mass questionably lipomatous lesion and 2 cm liver mass.  MRI was recommended.  Dr. Adrian Blackwater had  ordered this and patient states that she went for the study but ended up having to be seen in the emergency room because she had problems with her lungs Patient Active Problem List   Diagnosis Date Noted  . Immunization due 05/06/2017  . Left shoulder pain 10/14/2016  . Hypertension 10/14/2016  . S/P thyroidectomy 06/27/2016  . Chronic bilateral low back pain without sciatica 04/22/2016  . Liver lesion 12/24/2015  . Papillary thyroid carcinoma ([place]) 12/21/2015  . Controlled type 2 diabetes mellitus with complication, with long-term current use of insulin ([place]) 10/10/2015  . Right leg DVT ([place]) 10/10/2015  . Vision loss 08/25/2015  . Decreased vision in both eyes 08/25/2015  . Prolonged Q-T interval on ECG 08/19/2015  . History of pulmonary embolism 08/10/2015  . Moderate persistent asthma 04/13/2015  . Fibroid, uterine   . Morbid obesity ([place]) 11/11/2014  . Sinus tachycardia 11/08/2014  . Migraine 10/14/2014  . Menorrhagia 09/08/2014     Current Outpatient Medications on File Prior to Visit  Medication Sig Dispense Refill  . ACCU-CHEK AVIVA PLUS test strip USE TO TEST BLOOD GLUCOSE FOUR TIMES DAILY BEFORE MEALS AND AT BEDTIME 100 each 12  . albuterol (PROVENTIL) (2.5 MG/3ML) 0.083% nebulizer solution TAKE 3 MLS (2.5 MG TOTAL) BY NEBULIZATION EVERY 6 HOURS AS NEEDED FORWHEEZING OR SHORTNESS OF BREATH 150 mL 1  . amitriptyline (ELAVIL) 50 MG tablet Take 50 mg by mouth at bedtime.    Marland Kitchen atorvastatin (  LIPITOR) 20 MG tablet TAKE ONE (1) TABLET BY MOUTH EVERY DAY 90 tablet 1  . buPROPion (WELLBUTRIN XL) 150 MG 24 hr tablet Take 150 mg by mouth daily.    . citalopram (CELEXA) 10 MG tablet Take 10 mg by mouth daily.    . diclofenac sodium (VOLTAREN) 1 % GEL Apply 4 g topically 4 (four) times daily. To neck and left shoulder 100 g 5  . ELIQUIS 5 MG TABS tablet TAKE ONE (1) TABLET BY MOUTH TWO (2) TIMES DAILY 60 tablet 0  . fluticasone (CUTIVATE) 0.05 % cream Apply topically 2 (two) times  daily. 30 g 2  . fluticasone (FLONASE) 50 MCG/ACT nasal spray PLACE 2 SPRAYS INTO BOTH NOSTRILS DAILY 16 g 2  . Fluticasone-Salmeterol (ADVAIR DISKUS) 100-50 MCG/DOSE AEPB Inhale 1 puff into the lungs 2 (two) times daily. 60 each 5  . gabapentin (NEURONTIN) 100 MG capsule TAKE 1 CAPSULE BY MOUTH THREE TIMES A DAY 90 capsule 2  . hydrochlorothiazide (MICROZIDE) 12.5 MG capsule TAKE ONE CAPSULE BY MOUTH DAILY 30 capsule 2  . insulin glargine (LANTUS) 100 UNIT/ML injection Inject 24 Units into the skin at bedtime.    . insulin lispro (HUMALOG) 100 UNIT/ML injection Inject 6 Units into the skin 3 (three) times daily with meals. 8 U at beginning of meal in V-Go 30, up to 100 units/day     . megestrol (MEGACE) 40 MG tablet Take 3 tablets (120 mg total) by mouth 2 (two) times daily. 180 tablet 6  . metFORMIN (GLUCOPHAGE) 850 MG tablet Take 1 tablet (850 mg total) by mouth 2 (two) times daily with a meal. 180 tablet 3  . methocarbamol (ROBAXIN) 500 MG tablet TAKE ONE TABLET BY MOUTH EVERY EIGHT HOURS AS NEEDED FOR MUSCLE SPASMS 40 tablet 3  . metoprolol tartrate (LOPRESSOR) 50 MG tablet TAKE ONE (1) TABLET BY MOUTH TWO (2) TIMES DAILY 60 tablet 2  . montelukast (SINGULAIR) 10 MG tablet TAKE ONE TABLET BY MOUTH EVERY NIGHT AT BEDTIME 30 tablet 6  . ondansetron (ZOFRAN-ODT) 4 MG disintegrating tablet TAKE ONE TABLET BY MOUTH EVERY EIGHT HOURS AS NEEDED FOR NAUSEA OR VOMITING 20 tablet 0  . prazosin (MINIPRESS) 1 MG capsule Take 1 mg by mouth at bedtime.    Marland Kitchen PROAIR HFA 108 (90 Base) MCG/ACT inhaler INHALE 2 PUFFS INTO THE LUNGS EVERY 6 HOURS AS NEEDED FOR WHEEZING ORSHORTNESS OF BREATH 8.5 g 1  . rizatriptan (MAXALT) 10 MG tablet Take 1 tablet earliest onset of headache.  May repeat once after 2 hours if needed 10 tablet 3  . SYNTHROID 200 MCG tablet TAKE ONE TABLET BY MOUTH EVERY MORNING ALONG WITH A 25MCG TABLET FOR A TOTAL DOSE OF 225MCG 30 tablet 2  . topiramate (TOPAMAX) 25 MG tablet TAKE THREE TABLETS  BY MOUTH EVERY NIGHT AT BEDTIME 90 tablet 2  . traMADol (ULTRAM) 50 MG tablet Take 1 tablet (50 mg total) every 8 (eight) hours as needed by mouth. 60 tablet 2   No current facility-administered medications on file prior to visit.     Allergies  Allergen Reactions  . Ceftriaxone Anaphylaxis    ROCEPHIN  . Penicillins Shortness Of Breath    Has patient had a PCN reaction causing immediate rash, facial/tongue/throat swelling, SOB or lightheadedness with hypotension: Yes Has patient had a PCN reaction causing severe rash involving mucus membranes or skin necrosis: No Has patient had a PCN reaction that required hospitalization No Has patient had a PCN reaction occurring within  the last 10 years: No If all of the above answers are "NO", then may proceed with Cephalosporin use.   . Shellfish Allergy Anaphylaxis  . Gold-Containing Drug Products     HANDS ITCH  . [place]     HANDS SWELL  . [place] Rash    Social History   Socioeconomic History  . Marital status: Single    Spouse name: Not on file  . Number of children: Not on file  . Years of education: Not on file  . Highest education level: Not on file  Social Needs  . Financial resource strain: Not on file  . Food insecurity - worry: Not on file  . Food insecurity - inability: Not on file  . Transportation needs - medical: Not on file  . Transportation needs - non-medical: Not on file  Occupational History  . Not on file  Tobacco Use  . Smoking status: Former Smoker    Packs/day: 0.50    Years: 40.00    Pack years: 20.00    Types: Cigarettes    Last attempt to quit: 11/03/2014    Years since quitting: 2.6  . Smokeless tobacco: Never Used  Substance and Sexual Activity  . Alcohol use: No    Alcohol/week: 0.0 oz  . Drug use: No  . Sexual activity: No    Birth control/protection: None  Other Topics Concern  . Not on file  Social History Narrative  . Not on file    Family History  Problem Relation Age of Onset  .  Diabetes Mother   . Breast cancer Mother   . CAD Mother   . Hypertension Mother   . Alcohol abuse Father   . Hypertension Father     Past Surgical History:  Procedure Laterality Date  . ANGIOGRAM TO LEG  08-13-15   RIGHT  . CHOLECYSTECTOMY    . IR GENERIC HISTORICAL  04/04/2016   IR RADIOLOGIST EVAL & MGMT 04/04/2016 [name], MD [facility] INTERV RAD  . THYROIDECTOMY N/A 11/17/2015   Procedure: TOTAL THYROIDECTOMY;  Surgeon: Armandina Gemma, MD;  Location: [facility];  Service: General;  Laterality: N/A;  . UTERINE ARTERY EMBOLIZATION Bilateral 08/13/2015    ROS: Review of Systems Negative except as stated above PHYSICAL EXAM: BP 128/89   Pulse 97   Temp 98.3 F (36.8 C) (Oral)   Resp 16   Wt (!) 369 lb 6.4 oz (167.6 kg)   SpO2 95%   BMI 53.00 kg/m   Wt Readings from Last 3 Encounters:  07/03/17 (!) 369 lb 6.4 oz (167.6 kg)  06/16/17 (!) 363 lb 9.6 oz (164.9 kg)  05/06/17 (!) 354 lb 12.8 oz (160.9 kg)    Physical Exam  General appearance - alert, well appearing, obese AAF and in no distress Mental status - alert, oriented to person, place, and time, normal mood, behavior, speech, dress, motor activity, and thought processes Neck - supple, no significant adenopathy Chest - clear to auscultation, no wheezes, rales or rhonchi, symmetric air entry Heart - normal rate, regular rhythm, normal S1, S2, no murmurs, rubs, clicks or gallops Musculoskeletal - ambulates with cane, RT foot turned inwards Extremities - no LE edema   Depression screen [facility] 2/9 07/03/2017 06/16/2017 02/03/2017 01/15/2017 11/28/2016  Decreased Interest 2 2 1 1 1   Down, Depressed, Hopeless 2 2 1 1 1   PHQ - 2 Score 4 4 2 2 2   Altered sleeping 1 1 0 0 1  Tired, decreased energy 1 1 1 1  1  Change in appetite 1 2 1 3 3   Feeling bad or failure about yourself  0 0 0 0 0  Trouble concentrating 3 3 1 3 2   Moving slowly or fidgety/restless 1 2 0 2 2  Suicidal thoughts 0 0 0 0 0  PHQ-9 Score 11 13 5 11 11   Some recent  data might be hidden    ASSESSMENT AND PLAN: 1. Bilateral low back pain with bilateral sciatica, unspecified chronicity 2. Gait disturbance -She would benefit from home health aide as she does meet to skilled needs.  Patient lives alone. -Form completed [facility] services.  3. Right adrenal mass (HCC) - MR Abdomen W Wo Contrast; Future  4. Controlled type 2 diabetes mellitus with complication, with long-term current use of insulin ([place]) [facility]ed [facility] physician endocrinologist Dr. Buddy Duty.  Well controlled. - POCT glucose (manual entry) - Creatinine, serum  5. Liver lesion - MR Abdomen W Wo Contrast; Future  6. Depression, unspecified depression type Plug-in with mental health services.  7.  Obesity  weight continues to go up.  Hopefully with some assistance with meal prep with [facility] services, eating habits will improve.  Megace may still be playing a role and increase appetite.   Patient was given the opportunity to ask questions.  Patient verbalized understanding of the plan and was able to repeat key elements of the plan.   Orders Placed This Encounter  Procedures  . POCT glucose (manual entry)     Requested Prescriptions    No prescriptions requested or ordered in this encounter    No Follow-up on file.  [name], MD, FACP

## 2017-07-04 LAB — CREATININE, SERUM
Creatinine, Ser: 0.85 mg/dL (ref 0.57–1.00)
GFR calc non Af Amer: 81 mL/min/{1.73_m2} (ref >59)
GFR, EST AFRICAN AMERICAN: 93 mL/min/{1.73_m2} (ref >59)

## 2017-07-10 ENCOUNTER — Telehealth: Payer: Self-pay | Admitting: Internal Medicine

## 2017-07-10 NOTE — Telephone Encounter (Signed)
Rosemarie Ax called from [facility] scheduling and stated that the patients appointment for tomorrows MR Abdomen W Wo Contrast (Order # [id number]) will have to be canceled due to insurance purposes. Rosemarie Ax stated to call back once ready to reschedule. Phone # [phone].

## 2017-07-11 ENCOUNTER — Other Ambulatory Visit: Payer: Self-pay

## 2017-07-11 ENCOUNTER — Emergency Department (HOSPITAL_COMMUNITY)
Admission: EM | Admit: 2017-07-11 | Discharge: 2017-07-11 | Disposition: A | Discharge status: Home / Self Care | Payer: Medicaid Other | Attending: Emergency Medicine | Admitting: Emergency Medicine

## 2017-07-11 ENCOUNTER — Ambulatory Visit (HOSPITAL_COMMUNITY): Payer: Medicaid Other

## 2017-07-11 ENCOUNTER — Ambulatory Visit (HOSPITAL_COMMUNITY)
Admission: RE | Admit: 2017-07-11 | Discharge: 2017-07-11 | Disposition: A | Discharge status: Home / Self Care | Payer: Medicaid Other | Source: Ambulatory Visit | Attending: Internal Medicine | Admitting: Internal Medicine

## 2017-07-11 ENCOUNTER — Emergency Department (HOSPITAL_COMMUNITY): Payer: Medicaid Other

## 2017-07-11 DIAGNOSIS — Z794 Long term (current) use of insulin: Secondary | ICD-10-CM | POA: Insufficient documentation

## 2017-07-11 DIAGNOSIS — J45909 Unspecified asthma, uncomplicated: Secondary | ICD-10-CM | POA: Insufficient documentation

## 2017-07-11 DIAGNOSIS — E119 Type 2 diabetes mellitus without complications: Secondary | ICD-10-CM | POA: Insufficient documentation

## 2017-07-11 DIAGNOSIS — I10 Essential (primary) hypertension: Secondary | ICD-10-CM | POA: Insufficient documentation

## 2017-07-11 DIAGNOSIS — R1031 Right lower quadrant pain: Secondary | ICD-10-CM | POA: Insufficient documentation

## 2017-07-11 DIAGNOSIS — M5442 Lumbago with sciatica, left side: Secondary | ICD-10-CM | POA: Diagnosis not present

## 2017-07-11 DIAGNOSIS — M5441 Lumbago with sciatica, right side: Secondary | ICD-10-CM | POA: Diagnosis not present

## 2017-07-11 DIAGNOSIS — Z87891 Personal history of nicotine dependence: Secondary | ICD-10-CM | POA: Diagnosis not present

## 2017-07-11 DIAGNOSIS — Z79899 Other long term (current) drug therapy: Secondary | ICD-10-CM | POA: Insufficient documentation

## 2017-07-11 DIAGNOSIS — F909 Attention-deficit hyperactivity disorder, unspecified type: Secondary | ICD-10-CM | POA: Insufficient documentation

## 2017-07-11 DIAGNOSIS — I700 Atherosclerosis of aorta: Secondary | ICD-10-CM | POA: Insufficient documentation

## 2017-07-11 LAB — URINALYSIS, ROUTINE W REFLEX MICROSCOPIC
Bilirubin Urine: NEGATIVE
Glucose, UA: NEGATIVE mg/dL
Hgb urine dipstick: NEGATIVE
Ketones, ur: NEGATIVE mg/dL
Nitrite: NEGATIVE
Protein, ur: NEGATIVE mg/dL
Specific Gravity, Urine: >1.046 — ABNORMAL HIGH (ref 1.005–1.030)
pH: 5 (ref 5.0–8.0)

## 2017-07-11 LAB — CBC
HCT: 44.5 % (ref 36.0–46.0)
Hemoglobin: 14.2 g/dL (ref 12.0–15.0)
MCH: 28.6 pg (ref 26.0–34.0)
MCHC: 31.9 g/dL (ref 30.0–36.0)
MCV: 89.7 fL (ref 78.0–100.0)
Platelets: 306 10*3/uL (ref 150–400)
RBC: 4.96 MIL/uL (ref 3.87–5.11)
RDW: 15 % (ref 11.5–15.5)
WBC: 11 10*3/uL — ABNORMAL HIGH (ref 4.0–10.5)

## 2017-07-11 LAB — LIPASE, BLOOD: Lipase: 25 U/L (ref 11–51)

## 2017-07-11 LAB — COMPREHENSIVE METABOLIC PANEL
ALT: 28 U/L (ref 14–54)
AST: 16 U/L (ref 15–41)
Albumin: 3.5 g/dL (ref 3.5–5.0)
Alkaline Phosphatase: 98 U/L (ref 38–126)
Anion gap: 11 (ref 5–15)
BUN: 13 mg/dL (ref 6–20)
CO2: 21 mmol/L — ABNORMAL LOW (ref 22–32)
Calcium: 8.9 mg/dL (ref 8.9–10.3)
Chloride: 108 mmol/L (ref 101–111)
Creatinine, Ser: 0.97 mg/dL (ref 0.44–1.00)
GFR calc Af Amer: >60 mL/min (ref >60)
GFR calc non Af Amer: >60 mL/min (ref >60)
Glucose, Bld: 118 mg/dL — ABNORMAL HIGH (ref 65–99)
Potassium: 3.7 mmol/L (ref 3.5–5.1)
Sodium: 140 mmol/L (ref 135–145)
Total Bilirubin: 0.7 mg/dL (ref 0.3–1.2)
Total Protein: 6.8 g/dL (ref 6.5–8.1)

## 2017-07-11 LAB — I-STAT BETA HCG BLOOD, ED (MC, WL, AP ONLY): I-stat hCG, quantitative: <5 m[IU]/mL (ref <5)

## 2017-07-11 MED ORDER — DICYCLOMINE HCL 20 MG PO TABS: 20.0000 mg | ORAL_TABLET | Freq: Three times a day (TID) | ORAL | 0 refills | Status: DC | PRN

## 2017-07-11 MED ORDER — ONDANSETRON 4 MG PO TBDP: 4.0000 mg | ORAL_TABLET | Freq: Three times a day (TID) | ORAL | 0 refills | Status: DC | PRN

## 2017-07-11 MED ORDER — SODIUM CHLORIDE 0.9 % IV BOLUS (SEPSIS)
500.0000 mL | Freq: Once | INTRAVENOUS | Status: AC
  Administered 2017-07-11: 500 mL via INTRAVENOUS

## 2017-07-11 MED ORDER — MORPHINE SULFATE (PF) 4 MG/ML IV SOLN
4.0000 mg | Freq: Once | INTRAVENOUS | Status: AC
  Administered 2017-07-11: 4 mg via INTRAVENOUS
  Filled 2017-07-11: qty 1

## 2017-07-11 MED ORDER — IOPAMIDOL (ISOVUE-300) INJECTION 61%
INTRAVENOUS | Status: AC
  Filled 2017-07-11: qty 100

## 2017-07-11 NOTE — ED Notes (Signed)
Patient transported to CT 

## 2017-07-11 NOTE — Discharge Instructions (Addendum)
You were seen in the emergency for abdominal pain and nausea today. Your Labs today were consistent with your previous. You did not have any abnormalities in your kidney function, liver function, or electrolytes, than a somewhat elevated blood sugar level.  Your CT scan result summary is below:   IMPRESSION:  1.  No acute process in the abdomen or pelvis.  Normal appendix.  2.  Possible constipation.  3. Right adrenal myelolipoma.  4.  Aortic Atherosclerosis (ICD10-I70.0).  5. Esophageal air fluid level suggests dysmotility or  gastroesophageal reflux.   I have given you a prescription for Bentyl, this is an antispasmodic medicine to help with discomfort.  I have also given you a prescription for a few Zofran to help with nausea. It is important that when you pick the prescription up you discuss the potential interactions of this medication with other medications you are taking, including over the counter medications, with the pharmacists. These new medication have potential side effects. Be sure to contact your primary care provider or return to the emergency department if you are experiencing new symptoms that you are unable to tolerate after starting the medication. You need to receive medical evaluation immediately if you start to experience blistering of the skin, rash, swelling, or difficulty breathing as these signs could indicate a more serious medication side effect.    Call the GI doctor in your discharge instructions in order to make an appointment within the next 3-5 days.  Return to the emergency department for any new or worsening symptoms including but not limited to inability to keep down fluids, blood in your vomit or stool, worsening of your pain, or any other concerns.

## 2017-07-11 NOTE — ED Provider Notes (Signed)
[facility] EMERGENCY DEPARTMENT Provider Note   CSN: [id number] Arrival date & time: 07/11/17  3664     History   Chief Complaint Chief Complaint  Patient presents with  . Abdominal Pain    HPI Melinda [name] is a 52 y.o. female.  HPI   Melinda [name] is a 52 y.o. Female with a history of GERD, arthritis, diabetes, PE, prolonged QT, hypertension, seizures, chronic low back pain, PTSD and anxiety, who presents to the ED for evaluation of right lower quadrant pain and nausea that began last night.  Patient reports she is been very nauseous and had no appetite, but denies any episodes of emesis, no diarrhea or blood in the stool.  Patient describes right lower quadrant pain as constant and sharp in nature and radiating towards the umbilicus, patient denies pain in the flank or pain radiating toward the groin.  Denies dysuria or frequency although patient does endorse a history of UTIs.  No history of kidney stones.  Patient reports this pain feels very different from her back pain or from the pain she typically has from her uterine fibroids, much more acute in onset and sharper.  Patient denies any chest pain or shortness of breath.     Past Medical History:  Diagnosis Date  . ADHD (attention deficit hyperactivity disorder)   . Arthritis   . Asthma   . Cancer ([place])   . Chicken pox AGE 89  . Diabetes mellitus without complication ([place])   . GERD (gastroesophageal reflux disease)   . Glaucoma    BOTH EYES, NO EYE DROPS  . Glaucoma   . History of blood transfusion MARCH 2016    2UNITS GIVEN AND IRON GIVEN  . Hyperglycemia 09/09/2014  . Hypertension   . 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 Iron deficiency anemia due to chronic blood loss 09/09/2014  . Low TSH level 09/08/2014  . Lumbar herniated disc   . Menorrhagia 09/08/2014  . Migraine    CLUSTER AND MIGRAINES  . Multiple thyroid nodules   .  Ovarian mass, right 09/09/2014  . PE (pulmonary embolism) 2016  . Peripheral neuropathy    FINGER TIPS AND TOES NUMB SOME  . Preeclampsia  1983   WITH PREGNANCY  . PTSD (post-traumatic stress disorder)   . Scoliosis   . Seizures ([place]) AGE 89   NONE SINCE, HAD CHICKEN POX THEN    Patient Active Problem List   Diagnosis Date Noted  . Left shoulder pain 10/14/2016  . Hypertension 10/14/2016  . S/P thyroidectomy 06/27/2016  . Chronic bilateral low back pain without sciatica 04/22/2016  . Liver lesion 12/24/2015  . Papillary thyroid carcinoma ([place]) 12/21/2015  . Controlled type 2 diabetes mellitus with complication, with long-term current use of insulin ([place]) 10/10/2015  . Right leg DVT ([place]) 10/10/2015  . Vision loss 08/25/2015  . Decreased vision in both eyes 08/25/2015  . Prolonged Q-T interval on ECG 08/19/2015  . History of pulmonary embolism 08/10/2015  . Moderate persistent asthma 04/13/2015  . Fibroid, uterine   . Morbid obesity ([place]) 11/11/2014  . Sinus tachycardia 11/08/2014  . Migraine 10/14/2014  . Menorrhagia 09/08/2014    Past Surgical History:  Procedure Laterality Date  . ANGIOGRAM TO LEG  08-13-15   RIGHT  . CHOLECYSTECTOMY    . IR GENERIC HISTORICAL  04/04/2016   IR RADIOLOGIST EVAL & MGMT 04/04/2016 [name],  MD [facility] INTERV RAD  . THYROIDECTOMY N/A 11/17/2015   Procedure: TOTAL THYROIDECTOMY;  Surgeon: Armandina Gemma, MD;  Location: [facility] ORS;  Service: General;  Laterality: N/A;  . UTERINE ARTERY EMBOLIZATION Bilateral 08/13/2015    OB History    Gravida Para Term Preterm AB Living   1 1   1   1    SAB TAB Ectopic Multiple Live Births                   Home Medications    Prior to Admission medications   Medication Sig Start Date End Date Taking? Authorizing Provider  ACCU-CHEK AVIVA PLUS test strip USE TO TEST BLOOD GLUCOSE FOUR TIMES DAILY BEFORE MEALS AND AT BEDTIME 11/06/15   Funches, Josalyn, MD  albuterol (PROVENTIL) (2.5 MG/3ML) 0.083% nebulizer  solution TAKE 3 MLS (2.5 MG TOTAL) BY NEBULIZATION EVERY 6 HOURS AS NEEDED FORWHEEZING OR SHORTNESS OF BREATH 05/13/17   [name], MD  amitriptyline (ELAVIL) 50 MG tablet Take 50 mg by mouth at bedtime.    [provider]  atorvastatin (LIPITOR) 20 MG tablet TAKE ONE (1) TABLET BY MOUTH EVERY DAY 03/06/17   [name], MD  buPROPion (WELLBUTRIN XL) 150 MG 24 hr tablet Take 150 mg by mouth daily.    [provider]  citalopram (CELEXA) 10 MG tablet Take 10 mg by mouth daily.    [provider]  diclofenac sodium (VOLTAREN) 1 % GEL Apply 4 g topically 4 (four) times daily. To neck and left shoulder 10/14/16   Funches, Adriana Mccallum, MD  ELIQUIS 5 MG TABS tablet TAKE ONE (1) TABLET BY MOUTH TWO (2) TIMES DAILY 06/30/17   [name], MD  fluticasone (CUTIVATE) 0.05 % cream Apply topically 2 (two) times daily. 05/13/16   Funches, Adriana Mccallum, MD  fluticasone (FLONASE) 50 MCG/ACT nasal spray PLACE 2 SPRAYS INTO BOTH NOSTRILS DAILY 05/13/17   [name], MD  Fluticasone-Salmeterol (ADVAIR DISKUS) 100-50 MCG/DOSE AEPB Inhale 1 puff into the lungs 2 (two) times daily. 07/03/17   [name], MD  gabapentin (NEURONTIN) 100 MG capsule TAKE 1 CAPSULE BY MOUTH THREE TIMES A DAY 06/30/17   [name], MD  hydrochlorothiazide (MICROZIDE) 12.5 MG capsule TAKE ONE CAPSULE BY MOUTH DAILY 06/30/17   [name], MD  insulin glargine (LANTUS) 100 UNIT/ML injection Inject 24 Units into the skin at bedtime.    [provider]  insulin lispro (HUMALOG) 100 UNIT/ML injection Inject 6 Units into the skin 3 (three) times daily with meals. 8 U at beginning of meal in V-Go 30, up to 100 units/day     [provider]  megestrol (MEGACE) 40 MG tablet Take 3 tablets (120 mg total) by mouth 2 (two) times daily. 01/15/17   Anyanwu, Sallyanne Havers, MD  metFORMIN (GLUCOPHAGE) 850 MG tablet Take 1 tablet (850 mg total) by mouth 2 (two) times daily with a  meal. 10/09/15   Boykin Nearing, MD  methocarbamol (ROBAXIN) 500 MG tablet TAKE ONE TABLET BY MOUTH EVERY EIGHT HOURS AS NEEDED FOR MUSCLE SPASMS 06/13/17   [name], MD  metoprolol tartrate (LOPRESSOR) 50 MG tablet TAKE ONE (1) TABLET BY MOUTH TWO (2) TIMES DAILY 06/30/17   [name], MD  montelukast (SINGULAIR) 10 MG tablet TAKE ONE TABLET BY MOUTH EVERY NIGHT AT BEDTIME 02/04/17   [name], MD  ondansetron (ZOFRAN-ODT) 4 MG disintegrating tablet TAKE ONE TABLET BY MOUTH EVERY EIGHT HOURS AS NEEDED FOR  NAUSEA OR VOMITING 10/25/16   [name], DO  prazosin (MINIPRESS) 1 MG capsule Take 1 mg by mouth at bedtime.    [provider]  PROAIR HFA 108 [phone] Base) MCG/ACT inhaler INHALE 2 PUFFS INTO THE LUNGS EVERY 6 HOURS AS NEEDED FOR WHEEZING ORSHORTNESS OF BREATH 05/29/17   [name], MD  rizatriptan (MAXALT) 10 MG tablet Take 1 tablet earliest onset of headache.  May repeat once after 2 hours if needed 07/09/16   Metta Clines R, DO  SYNTHROID 200 MCG tablet TAKE ONE TABLET BY MOUTH EVERY MORNING ALONG WITH A 25MCG TABLET FOR A TOTAL DOSE OF 225MCG 06/30/17   [name], MD  topiramate (TOPAMAX) 25 MG tablet TAKE THREE TABLETS BY MOUTH EVERY NIGHT AT BEDTIME 07/02/17   Jaffe, Adam R, DO  traMADol (ULTRAM) 50 MG tablet Take 1 tablet (50 mg total) every 8 (eight) hours as needed by mouth. 05/06/17   [name], MD    Family History Family History  Problem Relation Age of Onset  . Diabetes Mother   . Breast cancer Mother   . CAD Mother   . Hypertension Mother   . Alcohol abuse Father   . Hypertension Father     Social History Social History   Tobacco Use  . Smoking status: Former Smoker    Packs/day: 0.50    Years: 40.00    Pack years: 20.00    Types: Cigarettes    Last attempt to quit: 11/03/2014    Years since quitting: 2.6  . Smokeless tobacco: Never Used  Substance Use Topics  . Alcohol use: No    Alcohol/week: 0.0 oz  .  Drug use: No     Allergies   Ceftriaxone; Penicillins; Shellfish allergy; Gold-containing drug products; Nickel; and Citrus   Review of Systems Review of Systems  Constitutional: Negative for chills and fever.  HENT: Negative for congestion, rhinorrhea and sore throat.   Eyes: Negative for discharge, redness and itching.  Respiratory: Negative for cough, chest tightness and shortness of breath.   Cardiovascular: Negative for chest pain and leg swelling.  Gastrointestinal: Positive for abdominal pain and nausea. Negative for blood in stool, diarrhea and vomiting.  Genitourinary: Negative for dysuria, vaginal bleeding and vaginal discharge.  Musculoskeletal: Negative for arthralgias and myalgias.  Skin: Negative for rash.  Neurological: Negative for dizziness, weakness and light-headedness.     Physical Exam Updated Vital Signs BP (!) 143/92 (BP Location: Right Arm)   Pulse 97   Temp 98.3 F (36.8 C) (Oral)   Resp 16   SpO2 99%   Physical Exam  Constitutional: She is oriented to person, place, and time. She appears well-developed and well-nourished.  Non-toxic appearance. No distress.  HENT:  Head: Normocephalic and atraumatic.  Mouth/Throat: Oropharynx is clear and moist.  Eyes: Right eye exhibits no discharge. Left eye exhibits no discharge.  Neck: Neck supple.  Cardiovascular: Normal rate, regular rhythm and normal heart sounds.  Pulmonary/Chest: Effort normal and breath sounds normal. No stridor. No respiratory distress. She has no wheezes. She has no rhonchi. She has no rales.  Abdominal: Soft. Bowel sounds are normal.  Pt focally tender in the right lower quadrant with guarding and rebound tenderness, specifically tenderness at McBurney's point, positive psoas sign and positive Rovsing sign, no CVA tenderness  Musculoskeletal: She exhibits no edema or deformity.  Neurological: She is alert and oriented to person, place, and time. Coordination normal.  Skin: Skin is  warm and  dry. Capillary refill takes less than 2 seconds. She is not diaphoretic.  Psychiatric: She has a normal mood and affect. Her behavior is normal.  Nursing note and vitals reviewed.    ED Treatments / Results  Labs (all labs ordered are listed, but only abnormal results are displayed) Labs Reviewed  COMPREHENSIVE METABOLIC PANEL - Abnormal; Notable for the following components:      Result Value   CO2 21 (*)    Glucose, Bld 118 (*)    All other components within normal limits  CBC - Abnormal; Notable for the following components:   WBC 11.0 (*)    All other components within normal limits  LIPASE, BLOOD  URINALYSIS, ROUTINE W REFLEX MICROSCOPIC  I-STAT BETA HCG BLOOD, ED ([facility], [facility], [facility] ONLY)    EKG  EKG Interpretation None       Radiology Dg Lumbar Spine Complete  Result Date: 07/11/2017 CLINICAL DATA:  Low back pain. EXAM: LUMBAR SPINE - COMPLETE 4+ VIEW COMPARISON:  CT 08/18/2015. FINDINGS: Degenerative changes lower lumbar spine spine. No acute bony abnormality identified. No evidence of fracture. Normal alignment. Aortoiliac atherosclerotic vascular calcification. IMPRESSION: 1. Degenerative changes lumbar spine. No acute bony abnormality identified. 2.  Aortoiliac atherosclerotic vascular disease. Electronically Signed   By: Marcello Moores  Register   On: 07/11/2017 09:44    Procedures Procedures (including critical care time)  Medications Ordered in ED Medications  sodium chloride 0.9 % bolus 500 mL (0 mLs Intravenous Stopped 07/11/17 1550)  morphine 4 MG/ML injection 4 mg (4 mg Intravenous Given 07/11/17 1421)     Initial Impression / Assessment and Plan / ED Course  I have reviewed the triage vital signs and the nursing notes.  Pertinent labs & imaging results that were available during my care of the patient were reviewed by me and considered in my medical decision making (see chart for details).  Presents with acute onset sharp right lower quadrant pain that  started last night, associated nausea and decreased appetite.  No episodes of vomiting or diarrhea, no bloody stools.  No fevers or chills.  Does have chronic low back pain and fibroids, but feels that this pain is different than the symptoms that either of these cause.  Vital signs normal and patient is overall well-appearing.  Patient is focally tender in the right lower quadrant with guarding and rebound tenderness, positive Rovsing's.   Lab evaluation reassuring, no leukocytosis, globin normal, no electrolyte derangements requiring intervention, kidney and liver function normal and lipase normal.  UA pending.  Will get CT abdomen pelvis, morphine for pain and small fluid bolus given since patient has history of mild heart failure with EF of 50.   On reevaluation patient reports an improvement in pain after morphine.  No episodes of emesis while here in the ED.  CT pending, patient aware that we still need urine.  EKG shows normal QTc of 433 if patient should require antiemetics.  Care signed out to [place], will follow up on CT scan results and urinalysis, discussed results with patient and despite appropriately.  Final Clinical Impressions(s) / ED Diagnoses   Final diagnoses:  Right lower quadrant abdominal pain    ED Discharge Orders    None       Janet Berlin 07/11/17 1635    Virgel Manifold, MD 07/11/17 1640

## 2017-07-11 NOTE — ED Notes (Signed)
Pt taking own Synthroid and Elaquis

## 2017-07-11 NOTE — ED Triage Notes (Signed)
Pt reports RLQ pain since last night with nausea no v/d.

## 2017-07-11 NOTE — ED Notes (Signed)
Pt verbalized understanding discharge instructions and denies any further needs or questions at this time. VS stable, ambulatory and steady gait.   

## 2017-07-11 NOTE — ED Notes (Signed)
Attempted to get UA on patient, patient aware urine sample needs to be collected.

## 2017-07-11 NOTE — ED Provider Notes (Signed)
16:20: Assumed care of patient from Benedetto Goad PA-C pending CT scan and UA results.    Patient is a 52 year old female with a history of GERD, diabetes, pulmonary embolism, prolonged QT, hypertension, and anxiety who presented to the emergency department with complaint of right lower quadrant abdominal pain and associated nausea since last night.    Results for orders placed or performed during the hospital encounter of 07/11/17  Lipase, blood  Result Value Ref Range   Lipase 25 11 - 51 U/L  Comprehensive metabolic panel  Result Value Ref Range   Sodium 140 135 - 145 mmol/L   Potassium 3.7 3.5 - 5.1 mmol/L   Chloride 108 101 - 111 mmol/L   CO2 21 (L) 22 - 32 mmol/L   Glucose, Bld 118 (H) 65 - 99 mg/dL   BUN 13 6 - 20 mg/dL   Creatinine, Ser 0.97 0.44 - 1.00 mg/dL   Calcium 8.9 8.9 - 10.3 mg/dL   Total Protein 6.8 6.5 - 8.1 g/dL   Albumin 3.5 3.5 - 5.0 g/dL   AST 16 15 - 41 U/L   ALT 28 14 - 54 U/L   Alkaline Phosphatase 98 38 - 126 U/L   Total Bilirubin 0.7 0.3 - 1.2 mg/dL   GFR calc non Af Amer >60 >60 mL/min   GFR calc Af Amer >60 >60 mL/min   Anion gap 11 5 - 15  CBC  Result Value Ref Range   WBC 11.0 (H) 4.0 - 10.5 K/uL   RBC 4.96 3.87 - 5.11 MIL/uL   Hemoglobin 14.2 12.0 - 15.0 g/dL   HCT 44.5 36.0 - 46.0 %   MCV 89.7 78.0 - 100.0 fL   MCH 28.6 26.0 - 34.0 pg   MCHC 31.9 30.0 - 36.0 g/dL   RDW 15.0 11.5 - 15.5 %   Platelets 306 150 - 400 K/uL  Urinalysis, Routine w reflex microscopic  Result Value Ref Range   Color, Urine YELLOW YELLOW   APPearance HAZY (A) CLEAR   Specific Gravity, Urine >1.046 (H) 1.005 - 1.030   pH 5.0 5.0 - 8.0   Glucose, UA NEGATIVE NEGATIVE mg/dL   Hgb urine dipstick NEGATIVE NEGATIVE   Bilirubin Urine NEGATIVE NEGATIVE   Ketones, ur NEGATIVE NEGATIVE mg/dL   Protein, ur NEGATIVE NEGATIVE mg/dL   Nitrite NEGATIVE NEGATIVE   Leukocytes, UA MODERATE (A) NEGATIVE   RBC / HPF 6-30 0 - 5 RBC/hpf   WBC, UA 6-30 0 - 5 WBC/hpf   Bacteria, UA  FEW (A) NONE SEEN   Squamous Epithelial / LPF 6-30 (A) NONE SEEN   Mucus PRESENT   I-Stat beta hCG blood, ED  Result Value Ref Range   I-stat hCG, quantitative <5.0 <5 mIU/mL   Comment 3           Dg Lumbar Spine Complete  Result Date: 07/11/2017 CLINICAL DATA:  Low back pain. EXAM: LUMBAR SPINE - COMPLETE 4+ VIEW COMPARISON:  CT 08/18/2015. FINDINGS: Degenerative changes lower lumbar spine spine. No acute bony abnormality identified. No evidence of fracture. Normal alignment. Aortoiliac atherosclerotic vascular calcification. IMPRESSION: 1. Degenerative changes lumbar spine. No acute bony abnormality identified. 2.  Aortoiliac atherosclerotic vascular disease. Electronically Signed   By: Marcello Moores  Register   On: 07/11/2017 09:44   Ct Abdomen Pelvis W Contrast  Result Date: 07/11/2017 CLINICAL DATA:  Right lower quadrant pain since last night.  Nausea. EXAM: CT ABDOMEN AND PELVIS WITH CONTRAST TECHNIQUE: Multidetector CT imaging of the abdomen and pelvis  was performed using the standard protocol following bolus administration of intravenous contrast. CONTRAST:  100 cc of Isovue 300 COMPARISON:  08/18/2015 CT.  Pelvic ultrasound 01/22/2017. FINDINGS: Lower chest: Right base scarring. Normal heart size without pericardial or pleural effusion. Fluid level in the esophagus on image 12/series 3. Hepatobiliary: Normal liver. Cholecystectomy, without biliary ductal dilatation. Pancreas: Normal, without mass or ductal dilatation. Spleen: Normal in size, without focal abnormality. Adrenals/Urinary Tract: Normal left adrenal gland. 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 Normal kidneys, without hydronephrosis. Normal urinary bladder. Stomach/Bowel: Normal stomach, without wall thickening. Colonic stool burden suggests constipation. Normal terminal ileum. Normal appendix, including on image 79/series 3. Normal small bowel. Vascular/Lymphatic: Aortic atherosclerosis. No abdominopelvic adenopathy. Reproductive:  Borderline soft tissue fullness throughout the uterus may relate to underlying fibroids. No adnexal mass. Other: No significant free fluid.  Mild pelvic floor laxity. Musculoskeletal: Right acetabular bone island. Degenerate disc disease at the lumbosacral junction. IMPRESSION: 1.  No acute process in the abdomen or pelvis.  Normal appendix. 2.  Possible constipation. 3. Right adrenal myelolipoma. 4.  Aortic Atherosclerosis (ICD10-I70.0). 5. Esophageal air fluid level suggests dysmotility or gastroesophageal reflux. Electronically Signed   By: Abigail Miyamoto M.D.   On: 07/11/2017 16:55   17:00: Lab work grossly unremarkable at this time, urine pending. EKG shows normal QTc of 433 should patient require antiemetics. CT of abdomen pelvis reviewed-no acute process in the abdomen or pelvis.  17:05: RE-EVAL: Discussed results thus far with patient including CT scan. Discussed need for urine sample. Patient resting comfortably, no complaints at this time.   18:34: RE-EVAL: UA with moderate leukocytes, however contaminated, only few bacteria, negative nitirites, patient without urinary sxs, doubt cystitis/pyelonephritis.   Patient does not meet the SIRS or Sepsis criteria.  On repeat exam patient does not have a surgical abdomen and there are no peritoneal signs.  No indication of appendicitis, bowel obstruction, bowel perforation, cholecystitis, diverticulitis, PID or ectopic pregnancy. Will provide prescription for Bentyl and for a few tablets of Zofran (EKG QTc WNL), will also provide GI follow-up. I discussed results, treatment plan, need for gastroenterology follow-up, and return precautions with the patient. Provided opportunity for questions, patient confirmed understanding and is in agreement with plan.   Vitals:   07/11/17 1331 07/11/17 1853  BP: (!) 143/92 140/82  Pulse: 97 91  Resp: 16 17  Temp:    SpO2: 99% 99%       Amaryllis Dyke, PA-C 07/12/17 0106    Nat Christen, MD 07/14/17  [phone]

## 2017-07-11 NOTE — Telephone Encounter (Signed)
Contacted [employer] and did the prior auth for the MRI Abdomen w/ wo contrast Dx codes: K76.89 Cpt code: [phone] Case Id# [id number]  Spoke to Dairl Ponder and the case needed more information was transferred to clinical rn and spoke to tara J. Per tara she would have to send it to the providers and I would need to call back to check on the status.

## 2017-07-11 NOTE — Telephone Encounter (Signed)
Will work on [place] today

## 2017-07-15 NOTE — Telephone Encounter (Signed)
Contacted [employer] to see if MRI abdomen w wo contrast was approved and it was. Approval code: [id number]   Contacted the scheduling department and spoke with Manuela Schwartz and gave her the approval code for the MRI abdomen w wo contrast she will be calling pt to schedule next MRI that is available

## 2017-08-08 ENCOUNTER — Telehealth: Payer: Self-pay | Admitting: Internal Medicine

## 2017-08-08 DIAGNOSIS — E118 Type 2 diabetes mellitus with unspecified complications: Secondary | ICD-10-CM

## 2017-08-08 DIAGNOSIS — Z794 Long term (current) use of insulin: Principal | ICD-10-CM

## 2017-08-08 DIAGNOSIS — M545 Low back pain, unspecified: Secondary | ICD-10-CM

## 2017-08-08 DIAGNOSIS — E890 Postprocedural hypothyroidism: Secondary | ICD-10-CM

## 2017-08-08 DIAGNOSIS — G8929 Other chronic pain: Secondary | ICD-10-CM

## 2017-08-08 DIAGNOSIS — Z9889 Other specified postprocedural states: Secondary | ICD-10-CM

## 2017-08-08 MED ORDER — METFORMIN HCL 850 MG PO TABS: 850.0000 mg | ORAL_TABLET | Freq: Two times a day (BID) | ORAL | 3 refills | Status: DC

## 2017-08-08 MED ORDER — SYNTHROID 200 MCG PO TABS: ORAL_TABLET | ORAL | 2 refills | Status: DC

## 2017-08-08 MED ORDER — APIXABAN 5 MG PO TABS: ORAL_TABLET | ORAL | 0 refills | Status: DC

## 2017-08-08 MED ORDER — METHOCARBAMOL 500 MG PO TABS: ORAL_TABLET | ORAL | 0 refills | Status: DC

## 2017-08-08 MED ORDER — ATORVASTATIN CALCIUM 20 MG PO TABS: 20.0000 mg | ORAL_TABLET | Freq: Every day | ORAL | 1 refills | Status: DC

## 2017-08-08 MED ORDER — METOPROLOL TARTRATE 50 MG PO TABS: ORAL_TABLET | ORAL | 2 refills | Status: DC

## 2017-08-08 MED FILL — ELIQUIS 5 MG TABLET: 5 | 30 days supply | Qty: 60 | Fill #0

## 2017-08-08 NOTE — Telephone Encounter (Signed)
Patient called and requested for refill on listed medication  ELIQUIS 5 MG TABS tablet [[id number]]  metFORMIN (GLUCOPHAGE) 850 MG tablet [[id number]]  atorvastatin (LIPITOR) 20 MG tablet [[id number]] methocarbamol (ROBAXIN) 500 MG tablet [[id number]]  hydrochlorothiazide (MICROZIDE) 12.5 MG capsule [[id number]] metoprolol tartrate (LOPRESSOR) 50 MG tablet [[id number]]  SYNTHROID 200 MCG tablet [[id number]]   patient requested for medications to be sent to [facility] pharmacy.

## 2017-08-08 NOTE — Telephone Encounter (Signed)
Sent scripts to [facility]

## 2017-08-08 NOTE — Telephone Encounter (Signed)
Patient called and requested for medication refill. traMADol (ULTRAM) 50 MG tablet [[id number]]   patient requested for it to be sent to [facility].

## 2017-08-08 NOTE — Telephone Encounter (Signed)
Patient called and requested for refill on listed medication  ELIQUIS 5 MG TABS tablet [[id number]]  metFORMIN (GLUCOPHAGE) 850 MG tablet [[id number]]  atorvastatin (LIPITOR) 20 MG tablet [[id number]] methocarbamol (ROBAXIN) 500 MG tablet [[id number]]  hydrochlorothiazide (MICROZIDE) 12.5 MG capsule [[id number]] metoprolol tartrate (LOPRESSOR) 50 MG tablet [[id number]]  SYNTHROID 200 MCG tablet [[id number]]

## 2017-08-11 MED FILL — METHOCARBAMOL 500 MG TABS: 500 | 13 days supply | Qty: 40 | Fill #0

## 2017-08-11 MED FILL — UNIFINE PENTIPS 32GX5/32": 32G X 4 MM | 25 days supply | Qty: 100 | Fill #0

## 2017-08-11 MED FILL — metFORMIN HCL 850 MG TABS: 850 | 90 days supply | Qty: 180 | Fill #0

## 2017-08-11 MED FILL — TOPIRAMATE 25 MG TABLET: 25 | 30 days supply | Qty: 90 | Fill #0

## 2017-08-11 MED FILL — MONTELUKAST SOD 10 MG TAB: 10 | 30 days supply | Qty: 30 | Fill #0

## 2017-08-11 MED FILL — BUPROPION HCL XL 300 MG TAB: 300 | 30 days supply | Qty: 30 | Fill #0

## 2017-08-11 MED FILL — ATORVASTATIN 20 MG TABLET: 20 | 30 days supply | Qty: 30 | Fill #0

## 2017-08-11 MED FILL — AMITRIPTYLINE HCL 50 MG TAB: 50 | 30 days supply | Qty: 30 | Fill #0

## 2017-08-11 MED FILL — UNIFINE PENTIPS 32GX5/32: 32G X 4 MM | 25 days supply | Qty: 100 | Fill #0

## 2017-08-11 MED FILL — PRAZOSIN 1 MG CAPSULE: 1 | 30 days supply | Qty: 30 | Fill #0

## 2017-08-11 MED FILL — HYDROCHLOROTHIAZIDE 12.5 MG: 12.5 | 30 days supply | Qty: 30 | Fill #0

## 2017-08-11 MED FILL — METOPROLOL TARTRATE 50 MG T: 50 | 30 days supply | Qty: 60 | Fill #0

## 2017-08-11 MED FILL — SYNTHROID 200 MCG TABLET: 200 | 30 days supply | Qty: 30 | Fill #0

## 2017-08-11 MED FILL — CITALOPRAM HBR 20 MG TABLET: 20 | 30 days supply | Qty: 30 | Fill #0

## 2017-08-12 MED ORDER — TRAMADOL HCL 50 MG PO TABS: 50.0000 mg | ORAL_TABLET | Freq: Three times a day (TID) | ORAL | 2 refills | Status: DC | PRN

## 2017-08-13 NOTE — Telephone Encounter (Signed)
Contacted pt and made aware that rx is ready for pickup. Pt states she will pick it up whe she comes to her appointment

## 2017-08-21 MED FILL — ACCU-CHEK SOFTCLIX LANCETS: 25 days supply | Qty: 100 | Fill #0

## 2017-09-02 ENCOUNTER — Other Ambulatory Visit: Payer: Self-pay | Admitting: Internal Medicine

## 2017-09-02 ENCOUNTER — Ambulatory Visit (INDEPENDENT_AMBULATORY_CARE_PROVIDER_SITE_OTHER): Payer: Medicaid Other | Admitting: Neurology

## 2017-09-02 ENCOUNTER — Encounter: Payer: Self-pay | Admitting: Neurology

## 2017-09-02 VITALS — BP 130/90 | HR 89 | Ht 70.0 in | Wt 380.0 lb

## 2017-09-02 DIAGNOSIS — G43009 Migraine without aura, not intractable, without status migrainosus: Secondary | ICD-10-CM | POA: Diagnosis not present

## 2017-09-02 MED FILL — ELIQUIS 5 MG TABLET: 5 | 30 days supply | Qty: 60 | Fill #0

## 2017-09-02 MED FILL — HUMALOG 100 UNITS/ML KWIKPE: 100 | 25 days supply | Qty: 6 | Fill #0

## 2017-09-02 MED FILL — METHOCARBAMOL 500 MG TABS: 500 | 13 days supply | Qty: 40 | Fill #1

## 2017-09-02 MED FILL — LANTUS SOLOSTAR 100 UNITS/M: 100 | 25 days supply | Qty: 6 | Fill #0

## 2017-09-02 NOTE — Progress Notes (Signed)
NEUROLOGY FOLLOW UP OFFICE NOTE  Melinda [name] [id number]  HISTORY OF PRESENT ILLNESS: Melinda [name] is a 52 year old right-handed female with type 2 diabetes, asthma, iron deficiency anemia, chronic low back pain, PE/DVT, PTSD, and history of thyroid cancer status post thyroidectomy who follows up for migraine.   UPDATE: For the past month, she has had daily headaches due to increased stress.  4 weeks ago she received a letter that she may be evicted.   Intensity:  8.5/10 Duration:  45 minutes with Maxalt (reduces intensity to manageable 4/10 for another couple of hours) Frequency:  3 days per month Frequency of abortive medication: daily Current NSAIDS:  Contraindicated (on anticoagulation) Current analgesics:  acetaminophen, tramadol (daily Current triptans:  Maxalt 10mg  Current anti-emetic: Zofran ODT 4mg  Current muscle relaxants: methocarbamol Current anti-anxiolytic:  no Current sleep aide:  no Current Antihypertensive medications:  metoprolol 50mg  twice daily, HCTZ Current Antidepressant medications:  Amitriptyline 50mg , bupropion 150mg , citalopram 10mg  Current Anticonvulsant medications:  topiramate 75mg , gabapentin 100mg  three times daily Current Vitamins/Herbal/Supplements:  ferrous sulfate Current Antihistamines/Decongestants:  Flonase Other therapy:  Sees psychologist and psychiatrist, which helps. 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    Of note, she saw her ophthalmologist who is referring her to Dr. Jolyn Nap, neuro-ophthalmologist at [facility].  She says it has to do with her glaucoma.   Caffeine:  no Alcohol:  no Smoker:  no Diet:  Hydrates.  No soda.  Eats baked and broiled chicken.  No fried foods Exercise:  Stationary bike (limited due to chronic  pain) Depression/anxiety:  Both increased since threat of eviction.  Sleep:  Poor.  Has night terrors secondary to PTSD.   HISTORY:  Onset:  For over 20 years but became daily 4 months. Location:  Right sided Quality:  squeezing Initial Intensity:  8.5/10 (sometimes 10/10); May:  8.5/10 Aura:  Sometimes preceded with visual aura (sees a pinwheel) Prodrome:  no Associated symptoms:  Nausea, photophobia, phonophobia.  No worse headache of life, change in quality, wakes her up from sleep. Initial Duration:  3 hours to all day; May: 45 minutes with Maxalt (reduces intensity to manageable 4/10 for another couple of hours) Initial Frequency:  daily; May:  5 to 10 days per month in a cluster during her cycle Triggers/exacerbating factors:  Apples, aged cheese, menstrual cycle Relieving factors:  no Activity:  Light activity aggravates   Past NSAIDS:  ibuprofen Past analgesics:  Excedrin Past abortive triptans:  Sumatriptan tablet/NS/[place] effective in past but 50mg  tablet no longer effective for this daily headache. Past muscle relaxants:  no Past anti-emetic:  no Past antihypertensive medications:  no Past antidepressant medications:  no Past anticonvulsant medications:  no Past vitamins/Herbal/Supplements:  no Past antihistamines/decongestants:  no Other past therapies:  no   Family history of headache:  Son has migraines.   CT of head from 01/16/15 was unremarkable.  In September 2018, she developed a "thunderclap" headache.  Onset was over a month ago.  It lasts for a couple of seconds and occurs once a week.  She has not had any vision loss, unilateral numbness or weakness or vertigo.  PAST MEDICAL HISTORY: Past Medical History:  Diagnosis Date  . ADHD (attention deficit hyperactivity disorder)   . Arthritis   . Asthma   . Cancer ([place])   .  Chicken pox AGE 89  . Diabetes mellitus without complication ([place])   . GERD (gastroesophageal reflux disease)   . Glaucoma    BOTH EYES,  NO EYE DROPS  . Glaucoma   . History of blood transfusion MARCH 2016    2UNITS GIVEN AND IRON GIVEN  . Hyperglycemia 09/09/2014  . Hypertension   . 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 Iron deficiency anemia due to chronic blood loss 09/09/2014  . Low TSH level 09/08/2014  . Lumbar herniated disc   . Menorrhagia 09/08/2014  . Migraine    CLUSTER AND MIGRAINES  . Multiple thyroid nodules   . Ovarian mass, right 09/09/2014  . PE (pulmonary embolism) 2016  . Peripheral neuropathy    FINGER TIPS AND TOES NUMB SOME  . Preeclampsia  1983   WITH PREGNANCY  . PTSD (post-traumatic stress disorder)   . Scoliosis   . Seizures ([place]) AGE 89   NONE SINCE, HAD CHICKEN POX THEN    MEDICATIONS: Current Outpatient Medications on File Prior to Visit  Medication Sig Dispense Refill  . ACCU-CHEK AVIVA PLUS test strip USE TO TEST BLOOD GLUCOSE FOUR TIMES DAILY BEFORE MEALS AND AT BEDTIME 100 each 12  . albuterol (PROVENTIL) (2.5 MG/3ML) 0.083% nebulizer solution TAKE 3 MLS (2.5 MG TOTAL) BY NEBULIZATION EVERY 6 HOURS AS NEEDED FORWHEEZING OR SHORTNESS OF BREATH 150 mL 1  . amitriptyline (ELAVIL) 50 MG tablet Take 50 mg by mouth at bedtime.    Marland Kitchen apixaban (ELIQUIS) 5 MG TABS tablet TAKE ONE (1) TABLET BY MOUTH TWO (2) TIMES DAILY 60 tablet 0  . atorvastatin (LIPITOR) 20 MG tablet Take 1 tablet (20 mg total) by mouth daily. 90 tablet 1  . buPROPion (WELLBUTRIN XL) 150 MG 24 hr tablet Take 150 mg by mouth daily.    . citalopram (CELEXA) 10 MG tablet Take 10 mg by mouth daily.    . diclofenac sodium (VOLTAREN) 1 % GEL Apply 4 g topically 4 (four) times daily. To neck and left shoulder 100 g 5  . dicyclomine (BENTYL) 20 MG tablet Take 1 tablet (20 mg total) by mouth 3 (three) times daily as needed for spasms. 30 tablet 0  . fluticasone (CUTIVATE) 0.05 % cream Apply topically 2 (two) times daily. 30 g 2  . fluticasone (FLONASE) 50 MCG/ACT  nasal spray PLACE 2 SPRAYS INTO BOTH NOSTRILS DAILY 16 g 2  . Fluticasone-Salmeterol (ADVAIR DISKUS) 100-50 MCG/DOSE AEPB Inhale 1 puff into the lungs 2 (two) times daily. 60 each 2  . gabapentin (NEURONTIN) 100 MG capsule TAKE 1 CAPSULE BY MOUTH THREE TIMES A DAY 90 capsule 2  . hydrochlorothiazide (MICROZIDE) 12.5 MG capsule TAKE ONE CAPSULE BY MOUTH DAILY 30 capsule 2  . insulin glargine (LANTUS) 100 UNIT/ML injection Inject 24 Units into the skin at bedtime.    . insulin lispro (HUMALOG) 100 UNIT/ML injection Inject 6 Units into the skin 3 (three) times daily with meals. 8 U at beginning of meal in V-Go 30, up to 100 units/day     . megestrol (MEGACE) 40 MG tablet Take 3 tablets (120 mg total) by mouth 2 (two) times daily. 180 tablet 6  . metFORMIN (GLUCOPHAGE) 850 MG tablet Take 1 tablet (850 mg total) by mouth 2 (two) times daily with a meal. 180 tablet 3  . methocarbamol (ROBAXIN) 500 MG tablet TAKE ONE TABLET BY MOUTH EVERY EIGHT HOURS AS  NEEDED FOR MUSCLE SPASMS 40 tablet 0  . metoprolol tartrate (LOPRESSOR) 50 MG tablet TAKE ONE (1) TABLET BY MOUTH TWO (2) TIMES DAILY 60 tablet 2  . montelukast (SINGULAIR) 10 MG tablet TAKE ONE TABLET BY MOUTH EVERY NIGHT AT BEDTIME 30 tablet 6  . ondansetron (ZOFRAN ODT) 4 MG disintegrating tablet Take 1 tablet (4 mg total) by mouth every 8 (eight) hours as needed for nausea or vomiting. 3 tablet 0  . prazosin (MINIPRESS) 1 MG capsule Take 1 mg by mouth at bedtime.    Marland Kitchen PROAIR HFA 108 (90 Base) MCG/ACT inhaler INHALE 2 PUFFS INTO THE LUNGS EVERY 6 HOURS AS NEEDED FOR WHEEZING ORSHORTNESS OF BREATH 8.5 g 1  . rizatriptan (MAXALT) 10 MG tablet Take 1 tablet earliest onset of headache.  May repeat once after 2 hours if needed 10 tablet 3  . SYNTHROID 200 MCG tablet TAKE ONE TABLET BY MOUTH EVERY MORNING ALONG WITH A 25MCG TABLET FOR A TOTAL DOSE OF 225MCG 30 tablet 2  . topiramate (TOPAMAX) 25 MG tablet TAKE THREE TABLETS BY MOUTH EVERY NIGHT AT BEDTIME 90  tablet 2  . traMADol (ULTRAM) 50 MG tablet Take 1 tablet (50 mg total) by mouth every 8 (eight) hours as needed. 60 tablet 2   No current facility-administered medications on file prior to visit.     ALLERGIES: Allergies  Allergen Reactions  . Ceftriaxone Anaphylaxis    ROCEPHIN  . Penicillins Shortness Of Breath    Has patient had a PCN reaction causing immediate rash, facial/tongue/throat swelling, SOB or lightheadedness with hypotension: Yes Has patient had a PCN reaction causing severe rash involving mucus membranes or skin necrosis: No Has patient had a PCN reaction that required hospitalization No Has patient had a PCN reaction occurring within the last 10 years: No If all of the above answers are "NO", then may proceed with Cephalosporin use.   . Shellfish Allergy Anaphylaxis  . Gold-Containing Drug Products     HANDS ITCH  . Nickel     HANDS SWELL  . Citrus Rash    FAMILY HISTORY: Family History  Problem Relation Age of Onset  . Diabetes Mother   . Breast cancer Mother   . CAD Mother   . Hypertension Mother   . Alcohol abuse Father   . Hypertension Father     SOCIAL HISTORY: Social History   Socioeconomic History  . Marital status: Single    Spouse name: Not on file  . Number of children: Not on file  . Years of education: Not on file  . Highest education level: Not on file  Social Needs  . Financial resource strain: Not on file  . Food insecurity - worry: Not on file  . Food insecurity - inability: Not on file  . Transportation needs - medical: Not on file  . Transportation needs - non-medical: Not on file  Occupational History  . Not on file  Tobacco Use  . Smoking status: Former Smoker    Packs/day: 0.50    Years: 40.00    Pack years: 20.00    Types: Cigarettes    Last attempt to quit: 11/03/2014    Years since quitting: 2.8  . Smokeless tobacco: Never Used  Substance and Sexual Activity  . Alcohol use: No    Alcohol/week: 0.0 oz  . Drug use:  No  . Sexual activity: No    Birth control/protection: None  Other Topics Concern  . Not on file  Social History Narrative  .  Not on file    REVIEW OF SYSTEMS: Constitutional: No fevers, chills, or sweats, no generalized fatigue, change in appetite Eyes: No visual changes, double vision, eye pain Ear, nose and throat: No hearing loss, ear pain, nasal congestion, sore throat Cardiovascular: No chest pain, palpitations Respiratory:  No shortness of breath at rest or with exertion, wheezes GastrointestinaI: No nausea, vomiting, diarrhea, abdominal pain, fecal incontinence Genitourinary:  No dysuria, urinary retention or frequency Musculoskeletal:  back pain Integumentary: No rash, pruritus, skin lesions Neurological: as above Psychiatric: depression, anxiety Endocrine: No palpitations, fatigue, diaphoresis, mood swings, change in appetite, change in weight, increased thirst Hematologic/Lymphatic:  No purpura, petechiae. Allergic/Immunologic: no itchy/runny eyes, nasal congestion, recent allergic reactions, rashes  PHYSICAL EXAM: Vitals:   09/02/17 1100  BP: 130/90  Pulse: 89   General: No acute distress.  Morbidly obese Head:  Normocephalic/atraumatic Eyes:  Fundi examined but not visualized Neck: supple, no paraspinal tenderness, full range of motion Heart:  Regular rate and rhythm Lungs:  Clear to auscultation bilaterally Back: No paraspinal tenderness Neurological Exam:alert and oriented to person, place, and time. Attention span and concentration intact, recent and remote memory intact, fund of knowledge intact.  Speech fluent and not dysarthric, language intact.  CN II-XII intact. Bulk and tone normal, muscle strength 5/5 throughout.  Sensation to light touch  intact.  Deep tendon reflexes absent throughout.  Finger to nose testing intact.  Antalgic gait. Ambulates with cane.  IMPRESSION: Migraine without aura  PLAN: As increase in migraines are provoked by current  stressors, we will not make any changes at this time and see if everything works out on its own.  I will obtain notes from her ophthalmologist to evaluate for cause of neuro-ophthalmology referral.  Further recommendations pending review.  Follow up in 3 months.  25 minutes spent face to face with patient, over 50% spent discussing management.  Metta Clines, DO  CC:  Dr. Wynetta Emery

## 2017-09-02 NOTE — Patient Instructions (Signed)
1.  I will review the notes from [place] and we may need to get MRI of brain 2.  Follow up in 3 months

## 2017-09-03 ENCOUNTER — Telehealth: Payer: Self-pay | Admitting: *Deleted

## 2017-09-03 DIAGNOSIS — D251 Intramural leiomyoma of uterus: Secondary | ICD-10-CM

## 2017-09-03 DIAGNOSIS — D252 Subserosal leiomyoma of uterus: Principal | ICD-10-CM

## 2017-09-03 DIAGNOSIS — N920 Excessive and frequent menstruation with regular cycle: Secondary | ICD-10-CM

## 2017-09-03 NOTE — Telephone Encounter (Signed)
Ertha called 09/02/17 pm and left a voice message she is calling because we have the wrong pharmacy. States she no longer uses [facility] but uses [facility]. Asks if we can send a refill of megace asap.

## 2017-09-05 DIAGNOSIS — H04123 Dry eye syndrome of bilateral lacrimal glands: Secondary | ICD-10-CM | POA: Diagnosis not present

## 2017-09-05 DIAGNOSIS — H2513 Age-related nuclear cataract, bilateral: Secondary | ICD-10-CM | POA: Diagnosis not present

## 2017-09-05 DIAGNOSIS — I671 Cerebral aneurysm, nonruptured: Secondary | ICD-10-CM | POA: Diagnosis not present

## 2017-09-05 NOTE — Telephone Encounter (Signed)
I called Melinda [name] and she states she has not had a period since July and ran out of the megace a few weeks ago. States no bleeding but some cramping and she doesn't want to have bleeding.  I informed her I would send refill request to Dr. Rosana Hoes who she saw last but also that she was supposed to have a 3 month follow up. I explained I will have registrars schedule her an appointment and call her . She voices understanding.

## 2017-09-12 ENCOUNTER — Other Ambulatory Visit: Payer: Self-pay | Admitting: Obstetrics & Gynecology

## 2017-09-12 ENCOUNTER — Other Ambulatory Visit: Payer: Self-pay | Admitting: Internal Medicine

## 2017-09-12 DIAGNOSIS — D252 Subserosal leiomyoma of uterus: Principal | ICD-10-CM

## 2017-09-12 DIAGNOSIS — D251 Intramural leiomyoma of uterus: Secondary | ICD-10-CM

## 2017-09-12 DIAGNOSIS — J4550 Severe persistent asthma, uncomplicated: Secondary | ICD-10-CM

## 2017-09-12 DIAGNOSIS — N920 Excessive and frequent menstruation with regular cycle: Secondary | ICD-10-CM

## 2017-09-12 MED ORDER — MEGESTROL ACETATE 40 MG PO TABS: 80.0000 mg | ORAL_TABLET | Freq: Two times a day (BID) | ORAL | 0 refills | Status: DC

## 2017-09-12 MED FILL — BUPROPION HCL XL 300 MG TAB: 300 | 30 days supply | Qty: 30 | Fill #1

## 2017-09-12 MED FILL — ATORVASTATIN 20 MG TABLET: 20 | 30 days supply | Qty: 30 | Fill #1

## 2017-09-12 MED FILL — HYDROCHLOROTHIAZIDE 12.5 MG: 12.5 | 30 days supply | Qty: 30 | Fill #1

## 2017-09-12 MED FILL — TOPIRAMATE 25 MG TABLET: 25 | 30 days supply | Qty: 90 | Fill #1

## 2017-09-12 MED FILL — METOPROLOL TARTRATE 50 MG T: 50 | 30 days supply | Qty: 60 | Fill #1

## 2017-09-12 MED FILL — METHOCARBAMOL 500 MG TABS: 500 | 13 days supply | Qty: 40 | Fill #0

## 2017-09-12 MED FILL — PRAZOSIN 1 MG CAPSULE: 1 | 30 days supply | Qty: 30 | Fill #1

## 2017-09-12 MED FILL — CITALOPRAM HBR 20 MG TABLET: 20 | 30 days supply | Qty: 30 | Fill #1

## 2017-09-12 MED FILL — SYNTHROID 200 MCG TABLET: 200 | 30 days supply | Qty: 30 | Fill #1

## 2017-09-12 MED FILL — AMITRIPTYLINE HCL 50 MG TAB: 50 | 30 days supply | Qty: 30 | Fill #1

## 2017-09-12 MED FILL — MONTELUKAST SOD 10 MG TAB: 10 | 30 days supply | Qty: 30 | Fill #0

## 2017-09-12 MED FILL — MEGESTROL 40 MG TABLET: 40 | 15 days supply | Qty: 60 | Fill #0

## 2017-09-19 ENCOUNTER — Other Ambulatory Visit: Payer: Self-pay

## 2017-09-19 ENCOUNTER — Emergency Department (HOSPITAL_COMMUNITY): Payer: Medicaid Other

## 2017-09-19 ENCOUNTER — Emergency Department (HOSPITAL_COMMUNITY)
Admission: EM | Admit: 2017-09-19 | Discharge: 2017-09-20 | Disposition: A | Discharge status: Home / Self Care | Payer: Medicaid Other | Attending: Emergency Medicine | Admitting: Emergency Medicine

## 2017-09-19 ENCOUNTER — Encounter (HOSPITAL_COMMUNITY): Payer: Self-pay | Admitting: Emergency Medicine

## 2017-09-19 DIAGNOSIS — E119 Type 2 diabetes mellitus without complications: Secondary | ICD-10-CM | POA: Insufficient documentation

## 2017-09-19 DIAGNOSIS — Z87891 Personal history of nicotine dependence: Secondary | ICD-10-CM | POA: Insufficient documentation

## 2017-09-19 DIAGNOSIS — R5383 Other fatigue: Secondary | ICD-10-CM | POA: Diagnosis not present

## 2017-09-19 DIAGNOSIS — Z79899 Other long term (current) drug therapy: Secondary | ICD-10-CM | POA: Diagnosis not present

## 2017-09-19 DIAGNOSIS — Z794 Long term (current) use of insulin: Secondary | ICD-10-CM | POA: Insufficient documentation

## 2017-09-19 DIAGNOSIS — R0789 Other chest pain: Secondary | ICD-10-CM | POA: Insufficient documentation

## 2017-09-19 DIAGNOSIS — J45901 Unspecified asthma with (acute) exacerbation: Secondary | ICD-10-CM | POA: Diagnosis not present

## 2017-09-19 DIAGNOSIS — Z7901 Long term (current) use of anticoagulants: Secondary | ICD-10-CM | POA: Diagnosis not present

## 2017-09-19 DIAGNOSIS — J449 Chronic obstructive pulmonary disease, unspecified: Secondary | ICD-10-CM | POA: Diagnosis not present

## 2017-09-19 DIAGNOSIS — R0602 Shortness of breath: Secondary | ICD-10-CM | POA: Diagnosis not present

## 2017-09-19 DIAGNOSIS — Z86711 Personal history of pulmonary embolism: Secondary | ICD-10-CM | POA: Diagnosis not present

## 2017-09-19 DIAGNOSIS — I10 Essential (primary) hypertension: Secondary | ICD-10-CM | POA: Insufficient documentation

## 2017-09-19 DIAGNOSIS — J4541 Moderate persistent asthma with (acute) exacerbation: Secondary | ICD-10-CM | POA: Diagnosis not present

## 2017-09-19 LAB — COMPREHENSIVE METABOLIC PANEL
ALBUMIN: 3.9 g/dL (ref 3.5–5.0)
ALK PHOS: 92 U/L (ref 38–126)
ALT: 64 U/L — ABNORMAL HIGH (ref 14–54)
AST: 61 U/L — AB (ref 15–41)
Anion gap: 9 (ref 5–15)
BUN: 13 mg/dL (ref 6–20)
CO2: 25 mmol/L (ref 22–32)
Calcium: 8.5 mg/dL — ABNORMAL LOW (ref 8.9–10.3)
Chloride: 109 mmol/L (ref 101–111)
Creatinine, Ser: 1.01 mg/dL — ABNORMAL HIGH (ref 0.44–1.00)
GFR calc Af Amer: >60 mL/min (ref >60)
GFR calc non Af Amer: >60 mL/min (ref >60)
GLUCOSE: 142 mg/dL — AB (ref 65–99)
POTASSIUM: 3.6 mmol/L (ref 3.5–5.1)
Sodium: 143 mmol/L (ref 135–145)
TOTAL PROTEIN: 7.1 g/dL (ref 6.5–8.1)
Total Bilirubin: 0.5 mg/dL (ref 0.3–1.2)

## 2017-09-19 LAB — CBC WITH DIFFERENTIAL/PLATELET
BASOS ABS: 0 10*3/uL (ref 0.0–0.1)
BASOS PCT: 0 %
Eosinophils Absolute: 0.1 10*3/uL (ref 0.0–0.7)
Eosinophils Relative: 1 %
HEMATOCRIT: 45.8 % (ref 36.0–46.0)
HEMOGLOBIN: 14.5 g/dL (ref 12.0–15.0)
Lymphocytes Relative: 26 %
Lymphs Abs: 2 10*3/uL (ref 0.7–4.0)
MCH: 28.8 pg (ref 26.0–34.0)
MCHC: 31.7 g/dL (ref 30.0–36.0)
MCV: 90.9 fL (ref 78.0–100.0)
Monocytes Absolute: 0.4 10*3/uL (ref 0.1–1.0)
Monocytes Relative: 6 %
NEUTROS ABS: 5.2 10*3/uL (ref 1.7–7.7)
NEUTROS PCT: 67 %
Platelets: 284 10*3/uL (ref 150–400)
RBC: 5.04 MIL/uL (ref 3.87–5.11)
RDW: 14.8 % (ref 11.5–15.5)
WBC: 7.7 10*3/uL (ref 4.0–10.5)

## 2017-09-19 LAB — LIPASE, BLOOD: Lipase: 22 U/L (ref 11–51)

## 2017-09-19 LAB — D-DIMER, QUANTITATIVE: D-Dimer, Quant: <0.27 ug/mL-FEU (ref 0.00–0.50)

## 2017-09-19 LAB — I-STAT TROPONIN, ED
Troponin i, poc: 0 ng/mL (ref 0.00–0.08)
Troponin i, poc: 0 ng/mL (ref 0.00–0.08)

## 2017-09-19 LAB — BRAIN NATRIURETIC PEPTIDE: B Natriuretic Peptide: 18.6 pg/mL (ref 0.0–100.0)

## 2017-09-19 MED ORDER — PREDNISONE 50 MG PO TABS: 50.0000 mg | ORAL_TABLET | Freq: Every day | ORAL | 0 refills | Status: AC

## 2017-09-19 NOTE — ED Provider Notes (Signed)
[facility] DEPT Provider Note   CSN: [id number] Arrival date & time: 09/19/17  1658     History   Chief Complaint Chief Complaint  Patient presents with  . Shortness of Breath    HPI Melinda [name] is a 52 y.o. female.  The history is provided by the patient and medical records. No language interpreter was used.  Shortness of Breath  This is a new problem. The average episode lasts 3 weeks. The problem occurs continuously.The current episode started 6 to 12 hours ago. The problem has been gradually improving. Associated symptoms include cough, sputum production, wheezing and chest pain. Pertinent negatives include no fever, no headaches, no rhinorrhea, no syncope, no vomiting, no abdominal pain, no leg pain and no leg swelling. The problem's precipitants include pollens and weather/humidity. She has tried beta-agonist inhalers, inhaled steroids and ipratropium inhalers for the symptoms. The treatment provided mild relief. Associated medical issues include asthma, COPD and PE.  Chest Pain   This is a new problem. The current episode started more than 2 days ago. The problem occurs constantly. The problem has not changed since onset.The pain is associated with breathing and coughing. The pain is present in the lateral region and substernal region. The pain is at a severity of 6/10. The pain is moderate. The quality of the pain is described as heavy, pleuritic and pressure-like. The pain does not radiate. Associated symptoms include cough, malaise/fatigue, shortness of breath and sputum production. Pertinent negatives include no abdominal pain, no back pain, no diaphoresis, no exertional chest pressure, no fever, no headaches, no irregular heartbeat, no leg pain, no lower extremity edema, no nausea, no near-syncope, no numbness, no palpitations, no syncope, no vomiting and no weakness. She has tried nothing for the symptoms. The treatment provided no relief.  Her past  medical history is significant for PE.    Past Medical History:  Diagnosis Date  . ADHD (attention deficit hyperactivity disorder)   . Arthritis   . Asthma   . Cancer ([place])   . Chicken pox AGE 82  . Diabetes mellitus without complication ([place])   . GERD (gastroesophageal reflux disease)   . Glaucoma    BOTH EYES, NO EYE DROPS  . Glaucoma   . History of blood transfusion MARCH 2016    2UNITS GIVEN AND IRON GIVEN  . Hyperglycemia 09/09/2014  . Hypertension   . 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 Iron deficiency anemia due to chronic blood loss 09/09/2014  . Low TSH level 09/08/2014  . Lumbar herniated disc   . Menorrhagia 09/08/2014  . Migraine    CLUSTER AND MIGRAINES  . Multiple thyroid nodules   . Ovarian mass, right 09/09/2014  . PE (pulmonary embolism) 2016  . Peripheral neuropathy    FINGER TIPS AND TOES NUMB SOME  . Preeclampsia  1983   WITH PREGNANCY  . PTSD (post-traumatic stress disorder)   . Scoliosis   . Seizures ([place]) AGE 35   NONE SINCE, HAD CHICKEN POX THEN    Patient Active Problem List   Diagnosis Date Noted  . Left shoulder pain 10/14/2016  . Hypertension 10/14/2016  . S/P thyroidectomy 06/27/2016  . Chronic bilateral low back pain without sciatica 04/22/2016  . Liver lesion 12/24/2015  . Papillary thyroid carcinoma ([place]) 12/21/2015  . Controlled type 2 diabetes mellitus with complication, with long-term current use of insulin ([place]) 10/10/2015  .  Right leg DVT ([place]) 10/10/2015  . Vision loss 08/25/2015  . Decreased vision in both eyes 08/25/2015  . Prolonged Q-T interval on ECG 08/19/2015  . History of pulmonary embolism 08/10/2015  . Moderate persistent asthma 04/13/2015  . Fibroid, uterine   . Morbid obesity ([place]) 11/11/2014  . Sinus tachycardia 11/08/2014  . Migraine 10/14/2014  . Menorrhagia 09/08/2014    Past Surgical History:  Procedure Laterality Date  . ANGIOGRAM  TO LEG  08-13-15   RIGHT  . CHOLECYSTECTOMY    . IR GENERIC HISTORICAL  04/04/2016   IR RADIOLOGIST EVAL & MGMT 04/04/2016 [name], MD [facility] INTERV RAD  . THYROIDECTOMY N/A 11/17/2015   Procedure: TOTAL THYROIDECTOMY;  Surgeon: Armandina Gemma, MD;  Location: [facility] ORS;  Service: General;  Laterality: N/A;  . UTERINE ARTERY EMBOLIZATION Bilateral 08/13/2015     OB History    Gravida  1   Para  1   Term      Preterm  1   AB      Living  1     SAB      TAB      Ectopic      Multiple      Live Births               Home Medications    Prior to Admission medications   Medication Sig Start Date End Date Taking? Authorizing Provider  ACCU-CHEK AVIVA PLUS test strip USE TO TEST BLOOD GLUCOSE FOUR TIMES DAILY BEFORE MEALS AND AT BEDTIME 11/06/15   Funches, Josalyn, MD  albuterol (PROVENTIL) (2.5 MG/3ML) 0.083% nebulizer solution TAKE 3 MLS (2.5 MG TOTAL) BY NEBULIZATION EVERY 6 HOURS AS NEEDED FORWHEEZING OR SHORTNESS OF BREATH 05/13/17   [name], MD  amitriptyline (ELAVIL) 50 MG tablet Take 50 mg by mouth at bedtime.    [provider]  atorvastatin (LIPITOR) 20 MG tablet Take 1 tablet (20 mg total) by mouth daily. 08/08/17   [name], MD  buPROPion (WELLBUTRIN XL) 150 MG 24 hr tablet Take 150 mg by mouth daily.    [provider]  citalopram (CELEXA) 10 MG tablet Take 10 mg by mouth daily.    [provider]  diclofenac sodium (VOLTAREN) 1 % GEL Apply 4 g topically 4 (four) times daily. To neck and left shoulder 10/14/16   Boykin Nearing, MD  dicyclomine (BENTYL) 20 MG tablet Take 1 tablet (20 mg total) by mouth 3 (three) times daily as needed for spasms. 07/11/17   Petrucelli, Samantha R, PA-C  ELIQUIS 5 MG TABS tablet TAKE 1 TABLET BY MOUTH TWICE DAILY 09/02/17   [name], MD  fluticasone (CUTIVATE) 0.05 % cream Apply topically 2 (two) times daily. 05/13/16   Funches, Adriana Mccallum, MD  fluticasone (FLONASE) 50 MCG/ACT nasal  spray PLACE 2 SPRAYS INTO BOTH NOSTRILS DAILY 05/13/17   [name], MD  Fluticasone-Salmeterol (ADVAIR DISKUS) 100-50 MCG/DOSE AEPB Inhale 1 puff into the lungs 2 (two) times daily. 07/03/17   [name], MD  gabapentin (NEURONTIN) 100 MG capsule TAKE 1 CAPSULE BY MOUTH THREE TIMES A DAY 06/30/17   [name], MD  hydrochlorothiazide (MICROZIDE) 12.5 MG capsule TAKE ONE CAPSULE BY MOUTH DAILY 06/30/17   [name], MD  insulin glargine (LANTUS) 100 UNIT/ML injection Inject 24 Units into the skin at bedtime.    [provider]  insulin lispro (HUMALOG) 100 UNIT/ML injection Inject 6 Units into the skin 3 (three) times  daily with meals. 8 U at beginning of meal in V-Go 30, up to 100 units/day     [provider]  megestrol (MEGACE) 40 MG tablet Take 2 tablets (80 mg total) by mouth 2 (two) times daily. 09/12/17   Lavonia Drafts, MD  metFORMIN (GLUCOPHAGE) 850 MG tablet Take 1 tablet (850 mg total) by mouth 2 (two) times daily with a meal. 08/08/17   [name], MD  methocarbamol (ROBAXIN) 500 MG tablet TAKE ONE TABLET BY MOUTH EVERY EIGHT HOURS AS NEEDED FOR MUSCLE SPASMS 08/08/17   [name], MD  metoprolol tartrate (LOPRESSOR) 50 MG tablet TAKE ONE (1) TABLET BY MOUTH TWO (2) TIMES DAILY 08/08/17   [name], MD  montelukast (SINGULAIR) 10 MG tablet TAKE 1 TABLET BY MOUTH EVERY NIGHT AT BEDTIME 09/12/17   [name], MD  ondansetron (ZOFRAN ODT) 4 MG disintegrating tablet Take 1 tablet (4 mg total) by mouth every 8 (eight) hours as needed for nausea or vomiting. 07/11/17   Petrucelli, Samantha R, PA-C  prazosin (MINIPRESS) 1 MG capsule Take 1 mg by mouth at bedtime.    [provider]  PROAIR HFA 108 [phone] Base) MCG/ACT inhaler INHALE 2 PUFFS INTO THE LUNGS EVERY 6 HOURS AS NEEDED FOR WHEEZING ORSHORTNESS OF BREATH 05/29/17   [name], MD  rizatriptan (MAXALT) 10 MG tablet Take 1 tablet earliest onset of  headache.  May repeat once after 2 hours if needed 07/09/16   Metta Clines R, DO  SYNTHROID 200 MCG tablet TAKE ONE TABLET BY MOUTH EVERY MORNING ALONG WITH A 25MCG TABLET FOR A TOTAL DOSE OF 225MCG 08/08/17   [name], MD  topiramate (TOPAMAX) 25 MG tablet TAKE THREE TABLETS BY MOUTH EVERY NIGHT AT BEDTIME 07/02/17   Jaffe, Adam R, DO  traMADol (ULTRAM) 50 MG tablet Take 1 tablet (50 mg total) by mouth every 8 (eight) hours as needed. 08/12/17   [name], MD    Family History Family History  Problem Relation Age of Onset  . Diabetes Mother   . Breast cancer Mother   . CAD Mother   . Hypertension Mother   . Alcohol abuse Father   . Hypertension Father     Social History Social History   Tobacco Use  . Smoking status: Former Smoker    Packs/day: 0.50    Years: 40.00    Pack years: 20.00    Types: Cigarettes    Last attempt to quit: 11/03/2014    Years since quitting: 2.8  . Smokeless tobacco: Never Used  Substance Use Topics  . Alcohol use: No    Alcohol/week: 0.0 oz  . Drug use: No     Allergies   Ceftriaxone; Penicillins; Shellfish allergy; Gold-containing drug products; Nickel; and Citrus   Review of Systems Review of Systems  Constitutional: Positive for chills, fatigue and malaise/fatigue. Negative for diaphoresis and fever.  HENT: Negative for congestion and rhinorrhea.   Eyes: Negative for visual disturbance.  Respiratory: Positive for cough, sputum production, chest tightness, shortness of breath and wheezing. Negative for stridor.   Cardiovascular: Positive for chest pain. Negative for palpitations, leg swelling, syncope and near-syncope.  Gastrointestinal: Negative for abdominal pain, constipation, diarrhea, nausea and vomiting.  Genitourinary: Negative for dysuria and flank pain.  Musculoskeletal: Negative for back pain and neck stiffness.  Neurological: Negative for facial asymmetry, weakness, light-headedness, numbness and headaches.    Psychiatric/Behavioral: Negative for agitation.  All other systems reviewed and are  negative.    Physical Exam Updated Vital Signs BP 120/69 (BP Location: Right Arm)   Pulse (!) 105   Temp 98.2 F (36.8 C) (Oral)   Resp 17   SpO2 97%   Physical Exam  Constitutional: She is oriented to person, place, and time. She appears well-developed and well-nourished.  Non-toxic appearance. She does not appear ill. No distress.  HENT:  Head: Normocephalic and atraumatic.  Nose: Nose normal.  Mouth/Throat: Oropharynx is clear and moist. No oropharyngeal exudate.  Eyes: Pupils are equal, round, and reactive to light. Conjunctivae and EOM are normal.  Neck: Normal range of motion. Neck supple.  Cardiovascular: Normal rate and intact distal pulses.  No murmur heard. Pulmonary/Chest: Effort normal. No stridor. No respiratory distress. She has no wheezes. She has rales. She exhibits tenderness.  Abdominal: Soft. Bowel sounds are normal. She exhibits no distension. There is no tenderness.  Musculoskeletal: She exhibits no edema or tenderness.  Neurological: She is alert and oriented to person, place, and time. No sensory deficit. She exhibits normal muscle tone.  Skin: Skin is warm. Capillary refill takes less than 2 seconds. No rash noted. She is not diaphoretic. No erythema.  Psychiatric: She has a normal mood and affect.  Nursing note and vitals reviewed.    ED Treatments / Results  Labs (all labs ordered are listed, but only abnormal results are displayed) Labs Reviewed  COMPREHENSIVE METABOLIC PANEL - Abnormal; Notable for the following components:      Result Value   Glucose, Bld 142 (*)    Creatinine, Ser 1.01 (*)    Calcium 8.5 (*)    AST 61 (*)    ALT 64 (*)    All other components within normal limits  URINE CULTURE  CBC WITH DIFFERENTIAL/PLATELET  LIPASE, BLOOD  D-DIMER, QUANTITATIVE (NOT AT [facility])  BRAIN NATRIURETIC PEPTIDE  URINALYSIS, ROUTINE W REFLEX MICROSCOPIC   I-STAT TROPONIN, ED  I-STAT TROPONIN, ED    EKG EKG Interpretation  Date/Time:  Friday September 19 2017 17:09:25 EDT Ventricular Rate:  104 PR Interval:    QRS Duration: 85 QT Interval:  354 QTC Calculation: 466 R Axis:   23 Text Interpretation:  Sinus tachycardia Low voltage, precordial leads Abnormal R-wave progression, early transition When compared to prior, faster rate/  no STEMI Confirmed by Antony Blackbird [phone]) on 09/19/2017 5:17:28 PM   Radiology Dg Chest 2 View  Result Date: 09/19/2017 CLINICAL DATA:  Shortness of breath EXAM: CHEST - 2 VIEW COMPARISON:  04/03/2016, CT 04/03/2016 FINDINGS: The heart size and mediastinal contours are within normal limits. Both lungs are clear. The visualized skeletal structures are unremarkable. IMPRESSION: No active cardiopulmonary disease. Electronically Signed   By: Donavan Foil M.D.   On: 09/19/2017 17:59    Procedures Procedures (including critical care time)  Medications Ordered in ED Medications - No data to display   Initial Impression / Assessment and Plan / ED Course  I have reviewed the triage vital signs and the nursing notes.  Pertinent labs & imaging results that were available during my care of the patient were reviewed by me and considered in my medical decision making (see chart for details).     Melinda [name] is a 52 y.o. female with a past medical history significant for asthma, COPD, hypertension, diabetes, GERD, seizures, and prior pulmonary embolism on Eliquis therapy who presents with shortness of breath, chest pain, cough,  and chills.  Patient reports that she has had a dry cough for the last  few weeks and she relates it to the season changes with temperature and pollens.  She reports that she always struggles with this however she reports today the shortness breath worsen.  She reports that over the last few days she is also developed a chest pain that is primarily in the right chest that worsened today.  It is a  pressure-like pain that is worse with deep breathing.  She reports a phlegm production cough with no hemoptysis.  She reports some chills but no fevers.  She reports that her chest pain or shortness breath are nonexertional but there are pleuritic.  She denies nausea, vomiting, vision changes, conservation, diarrhea, or urinary symptoms.  She denies any new leg pain or leg swelling.  She reports that she has not missed any doses of her Eliquis for pulmonary embolism prevention.  She denies recent chest trauma.  She denies any other complaints.  Patient says that her home breathing treatments have not worked.  Patient called EMS for respiratory assistance and received albuterol, Atrovent, and Solu-Medrol in route.  On my initial examination, patient has no significant wheezing.  There were minimal crackles in the bases and no rhonchi present.  Patient  has a large body habitus and lung auscultation is somewhat difficult however patient does report her breathing has improved slightly after the breathing treatments.  Patient is maintaining oxygen saturation on room air but is slightly tachycardic on my evaluation.  Abdomen is nontender.  Chest is minimally tender to palpation.  Back is nontender.  Legs are nonedematous.  EKG showed a sinus tachycardia with no STEMI.  Based on patient's description of symptoms asthma exacerbation is the most likely etiology given her known seasonal because of exacerbations however given the pleuritic chest pain with shortness of breath and her history of PE, d-dimer will be added.  Chest x-ray will be obtained to look for pneumonia given the productive cough.  BNP and troponin will be added as well.  Laboratory testing collected.  Patient was feeling tired and urinalysis also be sent.    Anticipate reassessment after diagnostic testing and workup.  Diagnostic testing was overall reassuring.  Troponin negative x2.  D-dimer negative.  Chest x-ray shows no pneumonia or fluid  overload.  BNP not elevated and CMP was reassuring.  Suspect asthma exacerbation as cause of symptoms.    Patient's breathing improved after the steroids given by EMS had more time to work.  Patient was reassessed and had significant improvement in wheezing.  Given patient's well appearance and reassuring workup, patient was felt stable for discharge home.  Patient is on room air and has reassuring vital signs.  Patient will be given prescription for steroids for the next few days and has all of her inhalers and nebulizers at home.  Patient understood strict return precautions and PCP follow-up.  Patient and all questions or concerns and was discharged in good condition.  Final Clinical Impressions(s) / ED Diagnoses   Final diagnoses:  Moderate asthma with exacerbation, unspecified whether persistent    ED Discharge Orders        Ordered    predniSONE (DELTASONE) 50 MG tablet  Daily     09/19/17 2251      Clinical Impression: 1. Moderate asthma with exacerbation, unspecified whether persistent     Disposition: Discharge  Condition: Good  I have discussed the results, Dx and Tx plan with the pt(& family if present). He/she/they expressed understanding and agree(s) with the plan. Discharge instructions discussed at great  length. Strict return precautions discussed and pt &/or family have verbalized understanding of the instructions. No further questions at time of discharge.    New Prescriptions   PREDNISONE (DELTASONE) 50 MG TABLET    Take 1 tablet (50 mg total) by mouth daily for 5 days.    Follow Up: [name], MD [place] [address] [phone]        Tegeler, Gwenyth Allegra, MD 09/19/17 [phone]

## 2017-09-19 NOTE — ED Triage Notes (Signed)
Per EMS pt SOB with hx COPD/asthma regardless of at home medicine. 0.5 Atrovent mg, 10 mg of Albuterol, and 125 of solu medrol given with EMS.

## 2017-09-19 NOTE — Discharge Instructions (Signed)
Your workup today showed evidence of an asthma attack.  You improved with the steroids and breathing treatments from EMS.  Please continue taking steroids for the next 5 days.  Please use your breathing treatments and nebulizer at home.  We did not find evidence of pneumonia, cardiac problems, or blood clot causing your symptoms.  Your exam improved during her stay.  Please follow-up with your primary care physician in the next several days.  If any symptoms change or worsen, please return to the nearest emergency department.

## 2017-09-19 NOTE — ED Notes (Signed)
Pt refused when asked if she would like something for pain at this time. Patient unable to void at this time. Instructed to push call bell when can give a specimen.  Turned off lights per patient request.  Denies needing anything else at this time

## 2017-09-19 NOTE — ED Notes (Signed)
Patient transported to X-ray 

## 2017-09-19 NOTE — ED Notes (Signed)
ED Provider at bedside. 

## 2017-09-20 NOTE — ED Notes (Signed)
Patient does not have a ride and will be waiting in the lobby until the morning. Pt alert and ambulatory with a cane.

## 2017-09-24 ENCOUNTER — Ambulatory Visit (INDEPENDENT_AMBULATORY_CARE_PROVIDER_SITE_OTHER): Payer: Medicaid Other | Admitting: Obstetrics and Gynecology

## 2017-09-24 ENCOUNTER — Encounter: Payer: Self-pay | Admitting: Obstetrics and Gynecology

## 2017-09-24 VITALS — BP 139/83 | HR 101 | Wt 386.4 lb

## 2017-09-24 DIAGNOSIS — N924 Excessive bleeding in the premenopausal period: Secondary | ICD-10-CM

## 2017-09-24 MED ORDER — MEGESTROL ACETATE 40 MG PO TABS: 40.0000 mg | ORAL_TABLET | Freq: Two times a day (BID) | ORAL | 1 refills | Status: DC

## 2017-09-24 MED FILL — MEGESTROL 40 MG TABLET: 40 | 30 days supply | Qty: 60 | Fill #0

## 2017-09-24 NOTE — Progress Notes (Signed)
GYNECOLOGY OFFICE FOLLOW UP NOTE  History:  52 y.o. G1P0101 here today for follow up for AUB. S/p [place] 08/2015 after which she did well but was restarted on megace for heavy bleeding July 2018. Has not had bleeding when on megace since then. Has had spotting when she stopped taking Megace altogether. She has had significant weight gain with megace and has been working to improve diet. Now on 80 mg BID (was one 120 mg BID).   Past Medical History:  Diagnosis Date  . ADHD (attention deficit hyperactivity disorder)   . Arthritis   . Asthma   . Cancer ([place])   . Chicken pox AGE 26  . Diabetes mellitus without complication ([place])   . GERD (gastroesophageal reflux disease)   . Glaucoma    BOTH EYES, NO EYE DROPS  . Glaucoma   . History of blood transfusion MARCH 2016    2UNITS GIVEN AND IRON GIVEN  . Hyperglycemia 09/09/2014  . Hypertension   . 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 Iron deficiency anemia due to chronic blood loss 09/09/2014  . Low TSH level 09/08/2014  . Lumbar herniated disc   . Menorrhagia 09/08/2014  . Migraine    CLUSTER AND MIGRAINES  . Multiple thyroid nodules   . Ovarian mass, right 09/09/2014  . PE (pulmonary embolism) 2016  . Peripheral neuropathy    FINGER TIPS AND TOES NUMB SOME  . Preeclampsia  1983   WITH PREGNANCY  . PTSD (post-traumatic stress disorder)   . Scoliosis   . Seizures ([place]) AGE 42   NONE SINCE, HAD CHICKEN POX THEN    Past Surgical History:  Procedure Laterality Date  . ANGIOGRAM TO LEG  08-13-15   RIGHT  . CHOLECYSTECTOMY    . IR GENERIC HISTORICAL  04/04/2016   IR RADIOLOGIST EVAL & MGMT 04/04/2016 [name], MD [facility] INTERV RAD  . THYROIDECTOMY N/A 11/17/2015   Procedure: TOTAL THYROIDECTOMY;  Surgeon: Armandina Gemma, MD;  Location: [facility];  Service: General;  Laterality: N/A;  . UTERINE ARTERY EMBOLIZATION Bilateral 08/13/2015     Current Outpatient  Medications:  .  ACCU-CHEK AVIVA PLUS test strip, USE TO TEST BLOOD GLUCOSE FOUR TIMES DAILY BEFORE MEALS AND AT BEDTIME, Disp: 100 each, Rfl: 12 .  albuterol (PROVENTIL) (2.5 MG/3ML) 0.083% nebulizer solution, TAKE 3 MLS (2.5 MG TOTAL) BY NEBULIZATION EVERY 6 HOURS AS NEEDED FORWHEEZING OR SHORTNESS OF BREATH, Disp: 150 mL, Rfl: 1 .  amitriptyline (ELAVIL) 50 MG tablet, Take 50 mg by mouth at bedtime., Disp: , Rfl:  .  atorvastatin (LIPITOR) 20 MG tablet, Take 1 tablet (20 mg total) by mouth daily., Disp: 90 tablet, Rfl: 1 .  buPROPion (WELLBUTRIN XL) 300 MG 24 hr tablet, Take 300 mg by mouth daily. , Disp: , Rfl: 2 .  citalopram (CELEXA) 20 MG tablet, , Disp: , Rfl: 2 .  diclofenac sodium (VOLTAREN) 1 % GEL, Apply 4 g topically 4 (four) times daily. To neck and left shoulder (Patient not taking: Reported on 09/19/2017), Disp: 100 g, Rfl: 5 .  dicyclomine (BENTYL) 20 MG tablet, Take 1 tablet (20 mg total) by mouth 3 (three) times daily as needed for spasms., Disp: 30 tablet, Rfl: 0 .  ELIQUIS 5 MG TABS tablet, TAKE 1 TABLET BY MOUTH TWICE DAILY, Disp: 60 tablet, Rfl: 0 .  fluticasone (CUTIVATE) 0.05 % cream, Apply topically 2 (two) times  daily., Disp: 30 g, Rfl: 2 .  fluticasone (FLONASE) 50 MCG/ACT nasal spray, PLACE 2 SPRAYS INTO BOTH NOSTRILS DAILY (Patient taking differently: Place 2 sprays into both nostrils daily as needed for allergies. ), Disp: 16 g, Rfl: 2 .  Fluticasone-Salmeterol (ADVAIR DISKUS) 100-50 MCG/DOSE AEPB, Inhale 1 puff into the lungs 2 (two) times daily. (Patient taking differently: Inhale 2 puffs into the lungs at bedtime. ), Disp: 60 each, Rfl: 2 .  gabapentin (NEURONTIN) 100 MG capsule, TAKE 1 CAPSULE BY MOUTH THREE TIMES A DAY, Disp: 90 capsule, Rfl: 2 .  hydrochlorothiazide (MICROZIDE) 12.5 MG capsule, TAKE ONE CAPSULE BY MOUTH DAILY, Disp: 30 capsule, Rfl: 2 .  insulin lispro (HUMALOG) 100 UNIT/ML injection, Inject 6 Units into the skin 3 (three) times daily with meals. 8  U at beginning of meal in V-Go 30, up to 100 units/day , Disp: , Rfl:  .  LANTUS SOLOSTAR 100 UNIT/ML Solostar Pen, Inject 24 Units into the skin daily at 10 pm. , Disp: , Rfl: 0 .  megestrol (MEGACE) 40 MG tablet, Take 2 tablets (80 mg total) by mouth 2 (two) times daily., Disp: 60 tablet, Rfl: 0 .  megestrol (MEGACE) 40 MG tablet, Take 1 tablet (40 mg total) by mouth 2 (two) times daily., Disp: 60 tablet, Rfl: 1 .  metFORMIN (GLUCOPHAGE) 850 MG tablet, Take 1 tablet (850 mg total) by mouth 2 (two) times daily with a meal., Disp: 180 tablet, Rfl: 3 .  methocarbamol (ROBAXIN) 500 MG tablet, TAKE ONE TABLET BY MOUTH EVERY EIGHT HOURS AS NEEDED FOR MUSCLE SPASMS, Disp: 40 tablet, Rfl: 0 .  metoprolol tartrate (LOPRESSOR) 50 MG tablet, TAKE ONE (1) TABLET BY MOUTH TWO (2) TIMES DAILY, Disp: 60 tablet, Rfl: 2 .  montelukast (SINGULAIR) 10 MG tablet, TAKE 1 TABLET BY MOUTH EVERY NIGHT AT BEDTIME, Disp: 30 tablet, Rfl: 2 .  ondansetron (ZOFRAN ODT) 4 MG disintegrating tablet, Take 1 tablet (4 mg total) by mouth every 8 (eight) hours as needed for nausea or vomiting., Disp: 3 tablet, Rfl: 0 .  prazosin (MINIPRESS) 1 MG capsule, Take 1 mg by mouth at bedtime., Disp: , Rfl:  .  predniSONE (DELTASONE) 50 MG tablet, Take 1 tablet (50 mg total) by mouth daily for 5 days., Disp: 5 tablet, Rfl: 0 .  PROAIR HFA 108 (90 Base) MCG/ACT inhaler, INHALE 2 PUFFS INTO THE LUNGS EVERY 6 HOURS AS NEEDED FOR WHEEZING ORSHORTNESS OF BREATH, Disp: 8.5 g, Rfl: 1 .  rizatriptan (MAXALT) 10 MG tablet, Take 1 tablet earliest onset of headache.  May repeat once after 2 hours if needed, Disp: 10 tablet, Rfl: 3 .  SYNTHROID 200 MCG tablet, TAKE ONE TABLET BY MOUTH EVERY MORNING ALONG WITH A 25MCG TABLET FOR A TOTAL DOSE OF 225MCG, Disp: 30 tablet, Rfl: 2 .  topiramate (TOPAMAX) 25 MG tablet, TAKE THREE TABLETS BY MOUTH EVERY NIGHT AT BEDTIME, Disp: 90 tablet, Rfl: 2 .  traMADol (ULTRAM) 50 MG tablet, Take 1 tablet (50 mg total) by  mouth every 8 (eight) hours as needed. (Patient taking differently: Take 50 mg by mouth every 8 (eight) hours as needed for moderate pain or severe pain. ), Disp: 60 tablet, Rfl: 2  The following portions of the patient's history were reviewed and updated as appropriate: allergies, current medications, past family history, past medical history, past social history, past surgical history and problem list.   Review of Systems:  Pertinent items noted in HPI and remainder of comprehensive ROS otherwise  negative.   Objective:  Physical Exam BP 139/83   Pulse (!) 101   Wt (!) 386 lb 6.6 oz (175.3 kg)   BMI 55.44 kg/m  CONSTITUTIONAL: Well-developed, well-nourished female in no acute distress.  HENT:  Normocephalic, atraumatic. External right and left ear normal. Oropharynx is clear and moist EYES: Conjunctivae and EOM are normal. Pupils are equal, round, and reactive to light. No scleral icterus.  NECK: Normal range of motion, supple, no masses SKIN: Skin is warm and dry. No rash noted. Not diaphoretic. No erythema. No pallor. NEUROLOGIC: Alert and oriented to person, place, and time. Normal reflexes, muscle tone coordination. No cranial nerve deficit noted. PSYCHIATRIC: Normal mood and affect. Normal behavior. Normal judgment and thought content. CARDIOVASCULAR: Normal heart rate noted RESPIRATORY: Effort mildly labored ABDOMEN: no distention noted.   PELVIC: Deferred MUSCULOSKELETAL: No edema noted.  Labs and Imaging Dg Chest 2 View  Result Date: 09/19/2017 CLINICAL DATA:  Shortness of breath EXAM: CHEST - 2 VIEW COMPARISON:  04/03/2016, CT 04/03/2016 FINDINGS: The heart size and mediastinal contours are within normal limits. Both lungs are clear. The visualized skeletal structures are unremarkable. IMPRESSION: No active cardiopulmonary disease. Electronically Signed   By: Donavan Foil M.D.   On: 09/19/2017 17:59    Assessment & Plan:   1. Excessive bleeding in premenopausal  period Has been on megace since 12/2016 to good effect Reviewed recommendation to not continue megace indefinitely, particularly with effects on appetite Will wean off over next few months If no improvement in AUB, will plan for repeat EMB at next visit   Routine preventative health maintenance measures emphasized. Please refer to After Visit Summary for other counseling recommendations.   Return in about 3 months (around 12/25/2017).   Feliz Beam, M.D. Attending [place], [facility] for [employer], [place]

## 2017-09-24 NOTE — Patient Instructions (Signed)
Take 80 mg twice daily for 2 weeks Then decrease to 40 mg twice daily for 2 weeks Then decrease to 40 mg once daily for 2 weeks Then stop Megace

## 2017-09-30 ENCOUNTER — Other Ambulatory Visit: Payer: Self-pay

## 2017-09-30 ENCOUNTER — Ambulatory Visit: Payer: Medicaid Other | Attending: Internal Medicine | Admitting: Physician Assistant

## 2017-09-30 ENCOUNTER — Telehealth: Payer: Self-pay | Admitting: Internal Medicine

## 2017-09-30 VITALS — BP 135/83 | HR 89 | Temp 98.2°F | Resp 18 | Ht 70.0 in | Wt 382.8 lb

## 2017-09-30 DIAGNOSIS — M545 Low back pain, unspecified: Secondary | ICD-10-CM

## 2017-09-30 DIAGNOSIS — J4540 Moderate persistent asthma, uncomplicated: Secondary | ICD-10-CM | POA: Diagnosis present

## 2017-09-30 DIAGNOSIS — E118 Type 2 diabetes mellitus with unspecified complications: Secondary | ICD-10-CM | POA: Diagnosis not present

## 2017-09-30 DIAGNOSIS — Z6841 Body Mass Index (BMI) 40.0 and over, adult: Secondary | ICD-10-CM | POA: Insufficient documentation

## 2017-09-30 DIAGNOSIS — Z794 Long term (current) use of insulin: Secondary | ICD-10-CM

## 2017-09-30 DIAGNOSIS — G8929 Other chronic pain: Secondary | ICD-10-CM | POA: Diagnosis not present

## 2017-09-30 DIAGNOSIS — J4541 Moderate persistent asthma with (acute) exacerbation: Secondary | ICD-10-CM | POA: Diagnosis not present

## 2017-09-30 LAB — GLUCOSE, POCT (MANUAL RESULT ENTRY): POC GLUCOSE: 110 mg/dL — AB (ref 70–99)

## 2017-09-30 MED ORDER — TRAMADOL HCL 50 MG PO TABS: 50.0000 mg | ORAL_TABLET | Freq: Three times a day (TID) | ORAL | 0 refills | Status: DC | PRN

## 2017-09-30 MED ORDER — TRAMADOL HCL 50 MG PO TABS: 50.0000 mg | ORAL_TABLET | Freq: Three times a day (TID) | ORAL | 2 refills | Status: DC | PRN

## 2017-09-30 NOTE — Progress Notes (Signed)
Hospital fup

## 2017-09-30 NOTE — Telephone Encounter (Signed)
Will forward to pcp  Pt has an appointment with walk-in for a hospital f/u

## 2017-09-30 NOTE — Telephone Encounter (Signed)
Pt came in to request a refill on  -traMADol (ULTRAM) 50 MG tablet  She states she never picked up the one for February because she had no transportation Please follow up

## 2017-09-30 NOTE — Progress Notes (Signed)
Chief Complaint: ED follow up  Subjective: Melinda [name] is a 52 year old female with multiple medical problems that was in the emergency department on 09/17/2017 for complaints of shortness of breath, cough, sputum production and chills. She also had some pleuritic left-sided chest discomfort. Her labs were favorable. C> She was treated with nebs, steroids and inhalers. She was sent home with an outpatient prescription for steroids.  She was unable to get the prescription. Secondary to transportation issues. She is planning to go and get the steroid today. No fevers. No chills. Her cough persists. Her breathing is much improved. She is compliant with her nebs and inhalers. She is compliant with her other medications.  She requested via phone earlier today a refill on her tramadol. It looks like this has been provided.   ROS:  GEN: denies fever or chills, denies change in weight Skin: denies lesions or rashes HEENT: denies headache, earache, epistaxis, sore throat, or neck pain LUNGS: less SHOB, dyspnea, PND, orthopnea +cough CV: denies CP or palpitations ABD: denies abd pain, N or V EXT: denies muscle spasms or swelling; no pain in lower ext, no weakness +back pain NEURO: denies numbness or tingling, denies sz, stroke or TIA   Objective:  Vitals:   09/30/17 1340  BP: 135/83  Pulse: 89  Resp: 18  Temp: 98.2 F (36.8 C)  TempSrc: Oral  SpO2: 99%  Weight: (!) 382 lb 12.8 oz (173.6 kg)  Height: 5\' 10"  (1.778 m)    Physical Exam:  General: in no acute distress. Morbidly obese. HEENT: no pallor, no icterus, moist oral mucosa, no JVD, no lymphadenopathy Heart: Normal  s1 &s2  Regular rate and rhythm, without murmurs, rubs, gallops. Lungs: Clear to auscultation bilaterally. Abdomen: Soft, nontender, nondistended, positive bowel sounds. Extremities: No clubbing cyanosis or edema with positive pedal pulses. Neuro: Alert, awake, oriented x3, nonfocal.  Pertinent Lab  Results:none   Medications: Prior to Admission medications   Medication Sig Start Date End Date Taking? Authorizing Provider  ACCU-CHEK AVIVA PLUS test strip USE TO TEST BLOOD GLUCOSE FOUR TIMES DAILY BEFORE MEALS AND AT BEDTIME 11/06/15  Yes Funches, Josalyn, MD  albuterol (PROVENTIL) (2.5 MG/3ML) 0.083% nebulizer solution TAKE 3 MLS (2.5 MG TOTAL) BY NEBULIZATION EVERY 6 HOURS AS NEEDED FORWHEEZING OR SHORTNESS OF BREATH 05/13/17  Yes [name], MD  amitriptyline (ELAVIL) 50 MG tablet Take 50 mg by mouth at bedtime.   Yes [provider]  atorvastatin (LIPITOR) 20 MG tablet Take 1 tablet (20 mg total) by mouth daily. 08/08/17  Yes [name], MD  buPROPion (WELLBUTRIN XL) 300 MG 24 hr tablet Take 300 mg by mouth daily.  09/12/17  Yes [provider]  citalopram (CELEXA) 20 MG tablet  09/12/17  Yes [provider]  dicyclomine (BENTYL) 20 MG tablet Take 1 tablet (20 mg total) by mouth 3 (three) times daily as needed for spasms. 07/11/17  Yes Petrucelli, Samantha R, PA-C  ELIQUIS 5 MG TABS tablet TAKE 1 TABLET BY MOUTH TWICE DAILY 09/02/17  Yes [name], MD  fluticasone (CUTIVATE) 0.05 % cream Apply topically 2 (two) times daily. 05/13/16  Yes Funches, Josalyn, MD  fluticasone (FLONASE) 50 MCG/ACT nasal spray PLACE 2 SPRAYS INTO BOTH NOSTRILS DAILY Patient taking differently: Place 2 sprays into both nostrils daily as needed for allergies.  05/13/17  Yes [name], MD  Fluticasone-Salmeterol (ADVAIR DISKUS) 100-50 MCG/DOSE AEPB Inhale 1 puff into the lungs 2 (two) times daily. Patient taking differently: Inhale  2 puffs into the lungs at bedtime.  07/03/17  Yes [name], MD  gabapentin (NEURONTIN) 100 MG capsule TAKE 1 CAPSULE BY MOUTH THREE TIMES A DAY 06/30/17  Yes [name], MD  hydrochlorothiazide (MICROZIDE) 12.5 MG capsule TAKE ONE CAPSULE BY MOUTH DAILY 06/30/17  Yes [name], MD  insulin lispro (HUMALOG) 100  UNIT/ML injection Inject 6 Units into the skin 3 (three) times daily with meals. 8 U at beginning of meal in V-Go 30, up to 100 units/day    Yes [provider]  LANTUS SOLOSTAR 100 UNIT/ML Solostar Pen Inject 24 Units into the skin daily at 10 pm.  09/02/17  Yes [provider]  megestrol (MEGACE) 40 MG tablet Take 2 tablets (80 mg total) by mouth 2 (two) times daily. 09/12/17  Yes Lavonia Drafts, MD  megestrol (MEGACE) 40 MG tablet Take 1 tablet (40 mg total) by mouth 2 (two) times daily. 09/24/17  Yes Sloan Leiter, MD  metFORMIN (GLUCOPHAGE) 850 MG tablet Take 1 tablet (850 mg total) by mouth 2 (two) times daily with a meal. 08/08/17  Yes [name], MD  metoprolol tartrate (LOPRESSOR) 50 MG tablet TAKE ONE (1) TABLET BY MOUTH TWO (2) TIMES DAILY 08/08/17  Yes [name], MD  montelukast (SINGULAIR) 10 MG tablet TAKE 1 TABLET BY MOUTH EVERY NIGHT AT BEDTIME 09/12/17  Yes [name], MD  prazosin (MINIPRESS) 1 MG capsule Take 1 mg by mouth at bedtime.   Yes [provider]  PROAIR HFA 108 (90 Base) MCG/ACT inhaler INHALE 2 PUFFS INTO THE LUNGS EVERY 6 HOURS AS NEEDED FOR WHEEZING ORSHORTNESS OF BREATH 05/29/17  Yes [name], MD  rizatriptan (MAXALT) 10 MG tablet Take 1 tablet earliest onset of headache.  May repeat once after 2 hours if needed 07/09/16  Yes Jaffe, Adam R, DO  SYNTHROID 200 MCG tablet TAKE ONE TABLET BY MOUTH EVERY MORNING ALONG WITH A 25MCG TABLET FOR A TOTAL DOSE OF 225MCG 08/08/17  Yes [name], MD  topiramate (TOPAMAX) 25 MG tablet TAKE THREE TABLETS BY MOUTH EVERY NIGHT AT BEDTIME 07/02/17  Yes Jaffe, Adam R, DO  diclofenac sodium (VOLTAREN) 1 % GEL Apply 4 g topically 4 (four) times daily. To neck and left shoulder Patient not taking: Reported on 09/19/2017 10/14/16   Boykin Nearing, MD  methocarbamol (ROBAXIN) 500 MG tablet TAKE ONE TABLET BY MOUTH EVERY EIGHT HOURS AS NEEDED FOR MUSCLE SPASMS Patient not  taking: Reported on 09/30/2017 08/08/17   [name], MD  ondansetron (ZOFRAN ODT) 4 MG disintegrating tablet Take 1 tablet (4 mg total) by mouth every 8 (eight) hours as needed for nausea or vomiting. Patient not taking: Reported on 09/30/2017 07/11/17   Petrucelli, Glynda Jaeger, PA-C  traMADol (ULTRAM) 50 MG tablet Take 1 tablet (50 mg total) by mouth every 8 (eight) hours as needed for moderate pain or severe pain. 09/30/17   Brayton Caves, PA-C    Assessment: 1. Moderate Persistent Asthma Exacerbation 2. Morbid Obesity 3. Chronic Low Back Pain  Plan: Encouraged to get the steroid taper today Cont nebs, inhalers Avoid Triggers Tramadol was refilled earlier today Weight loss Encouraged  Follow up:4 weeks or as scheduled   The patient was given clear instructions to go to ER or return to medical center if symptoms don't improve, worsen or new problems develop. The patient verbalized understanding. The patient was told to call to get lab results if they haven't  heard anything in the next week.   This note has been created with Surveyor, quantity. Any transcriptional errors are unintentional.   Zettie Pho, PA-C 09/30/2017, 2:45 PM

## 2017-09-30 NOTE — Telephone Encounter (Signed)
Pt never did pick up Tramadol rxn that was printed and left at front desk 08/2017.  She is here today to see PA post hosp visit and request the rxn.  New Tramadol rxn generated as front desk unable to locate the rxn written 08/2017.  [place] reviewed.  Her last rxn was filled 07/02/2017.

## 2017-10-02 ENCOUNTER — Ambulatory Visit: Payer: Medicaid Other | Admitting: Internal Medicine

## 2017-10-06 ENCOUNTER — Other Ambulatory Visit: Payer: Self-pay | Admitting: Neurology

## 2017-10-06 ENCOUNTER — Other Ambulatory Visit: Payer: Self-pay | Admitting: Internal Medicine

## 2017-10-06 DIAGNOSIS — I10 Essential (primary) hypertension: Secondary | ICD-10-CM

## 2017-10-06 DIAGNOSIS — G8929 Other chronic pain: Secondary | ICD-10-CM

## 2017-10-06 DIAGNOSIS — M545 Low back pain, unspecified: Secondary | ICD-10-CM

## 2017-10-06 DIAGNOSIS — E890 Postprocedural hypothyroidism: Secondary | ICD-10-CM

## 2017-10-06 DIAGNOSIS — Z9889 Other specified postprocedural states: Secondary | ICD-10-CM

## 2017-10-06 MED FILL — AMITRIPTYLINE HCL 50 MG TAB: 50 | 30 days supply | Qty: 30 | Fill #2

## 2017-10-06 MED FILL — CITALOPRAM HBR 20 MG TABLET: 20 | 30 days supply | Qty: 30 | Fill #2

## 2017-10-06 MED FILL — MONTELUKAST SOD 10 MG TAB: 10 | 30 days supply | Qty: 30 | Fill #1

## 2017-10-06 MED FILL — BuPROPion HCL ER (XL) 300 M: 300 | 30 days supply | Qty: 30 | Fill #2

## 2017-10-06 MED FILL — ATORVASTATIN 20 MG TABLET: 20 | 30 days supply | Qty: 30 | Fill #2

## 2017-10-06 MED FILL — ELIQUIS 5 MG TABLET: 5 | 30 days supply | Qty: 60 | Fill #0

## 2017-10-06 MED FILL — PRAZOSIN 1 MG CAPSULE: 1 | 30 days supply | Qty: 30 | Fill #2

## 2017-10-07 MED FILL — TOPIRAMATE 25 MG TABLET: 25 | 30 days supply | Qty: 90 | Fill #0

## 2017-10-09 MED FILL — HYDROCHLOROTHIAZIDE 12.5 MG: 12.5 | 30 days supply | Qty: 30 | Fill #0

## 2017-10-09 MED FILL — METHOCARBAMOL 500 MG TABS: 500 | 13 days supply | Qty: 40 | Fill #0

## 2017-10-09 MED FILL — METOPROLOL TARTRATE 50 MG T: 50 | 30 days supply | Qty: 60 | Fill #0

## 2017-10-09 MED FILL — SYNTHROID 200 MCG TABLET: 200 | 30 days supply | Qty: 30 | Fill #0

## 2017-10-20 MED FILL — UNIFINE PENTIPS 32GX5/32: 32G X 4 MM | 25 days supply | Qty: 100 | Fill #1

## 2017-10-20 MED FILL — UNIFINE PENTIPS 32GX5/32": 32G X 4 MM | 25 days supply | Qty: 100 | Fill #1

## 2017-10-20 MED FILL — ACCU-CHEK SOFTCLIX LANCETS: 25 days supply | Qty: 100 | Fill #1

## 2017-10-24 ENCOUNTER — Encounter: Payer: Self-pay | Admitting: Neurology

## 2017-10-24 MED FILL — HUMALOG 100 UNITS/ML KWIKPE: 100 | 25 days supply | Qty: 6 | Fill #1

## 2017-10-31 ENCOUNTER — Other Ambulatory Visit: Payer: Self-pay

## 2017-10-31 ENCOUNTER — Ambulatory Visit: Payer: Medicaid Other | Attending: Internal Medicine | Admitting: Internal Medicine

## 2017-10-31 ENCOUNTER — Encounter: Payer: Self-pay | Admitting: Internal Medicine

## 2017-10-31 VITALS — BP 119/72 | HR 100 | Temp 98.3°F | Resp 18 | Ht 70.0 in | Wt 373.6 lb

## 2017-10-31 DIAGNOSIS — Z91013 Allergy to seafood: Secondary | ICD-10-CM | POA: Insufficient documentation

## 2017-10-31 DIAGNOSIS — L84 Corns and callosities: Secondary | ICD-10-CM | POA: Diagnosis not present

## 2017-10-31 DIAGNOSIS — Z86711 Personal history of pulmonary embolism: Secondary | ICD-10-CM | POA: Insufficient documentation

## 2017-10-31 DIAGNOSIS — M545 Low back pain: Secondary | ICD-10-CM | POA: Diagnosis not present

## 2017-10-31 DIAGNOSIS — R7989 Other specified abnormal findings of blood chemistry: Secondary | ICD-10-CM | POA: Insufficient documentation

## 2017-10-31 DIAGNOSIS — Z87891 Personal history of nicotine dependence: Secondary | ICD-10-CM | POA: Insufficient documentation

## 2017-10-31 DIAGNOSIS — Z85850 Personal history of malignant neoplasm of thyroid: Secondary | ICD-10-CM | POA: Insufficient documentation

## 2017-10-31 DIAGNOSIS — Z880 Allergy status to penicillin: Secondary | ICD-10-CM | POA: Insufficient documentation

## 2017-10-31 DIAGNOSIS — D259 Leiomyoma of uterus, unspecified: Secondary | ICD-10-CM | POA: Insufficient documentation

## 2017-10-31 DIAGNOSIS — Z79890 Hormone replacement therapy (postmenopausal): Secondary | ICD-10-CM | POA: Insufficient documentation

## 2017-10-31 DIAGNOSIS — Z79899 Other long term (current) drug therapy: Secondary | ICD-10-CM | POA: Diagnosis not present

## 2017-10-31 DIAGNOSIS — Z7901 Long term (current) use of anticoagulants: Secondary | ICD-10-CM | POA: Diagnosis not present

## 2017-10-31 DIAGNOSIS — E119 Type 2 diabetes mellitus without complications: Secondary | ICD-10-CM | POA: Diagnosis present

## 2017-10-31 DIAGNOSIS — Z86718 Personal history of other venous thrombosis and embolism: Secondary | ICD-10-CM | POA: Insufficient documentation

## 2017-10-31 DIAGNOSIS — E279 Disorder of adrenal gland, unspecified: Secondary | ICD-10-CM | POA: Insufficient documentation

## 2017-10-31 DIAGNOSIS — R945 Abnormal results of liver function studies: Secondary | ICD-10-CM

## 2017-10-31 DIAGNOSIS — E118 Type 2 diabetes mellitus with unspecified complications: Secondary | ICD-10-CM | POA: Insufficient documentation

## 2017-10-31 DIAGNOSIS — Z811 Family history of alcohol abuse and dependence: Secondary | ICD-10-CM | POA: Insufficient documentation

## 2017-10-31 DIAGNOSIS — G8929 Other chronic pain: Secondary | ICD-10-CM | POA: Diagnosis not present

## 2017-10-31 DIAGNOSIS — E890 Postprocedural hypothyroidism: Secondary | ICD-10-CM | POA: Diagnosis not present

## 2017-10-31 DIAGNOSIS — Z881 Allergy status to other antibiotic agents status: Secondary | ICD-10-CM | POA: Diagnosis not present

## 2017-10-31 DIAGNOSIS — Z789 Other specified health status: Secondary | ICD-10-CM

## 2017-10-31 DIAGNOSIS — Z6841 Body Mass Index (BMI) 40.0 and over, adult: Secondary | ICD-10-CM | POA: Diagnosis not present

## 2017-10-31 DIAGNOSIS — Z8249 Family history of ischemic heart disease and other diseases of the circulatory system: Secondary | ICD-10-CM | POA: Insufficient documentation

## 2017-10-31 DIAGNOSIS — Z794 Long term (current) use of insulin: Secondary | ICD-10-CM | POA: Diagnosis not present

## 2017-10-31 DIAGNOSIS — J4541 Moderate persistent asthma with (acute) exacerbation: Secondary | ICD-10-CM | POA: Diagnosis not present

## 2017-10-31 DIAGNOSIS — I10 Essential (primary) hypertension: Secondary | ICD-10-CM | POA: Diagnosis not present

## 2017-10-31 DIAGNOSIS — J301 Allergic rhinitis due to pollen: Secondary | ICD-10-CM | POA: Insufficient documentation

## 2017-10-31 DIAGNOSIS — Z803 Family history of malignant neoplasm of breast: Secondary | ICD-10-CM | POA: Insufficient documentation

## 2017-10-31 DIAGNOSIS — M25512 Pain in left shoulder: Secondary | ICD-10-CM | POA: Diagnosis not present

## 2017-10-31 DIAGNOSIS — Z9049 Acquired absence of other specified parts of digestive tract: Secondary | ICD-10-CM | POA: Insufficient documentation

## 2017-10-31 DIAGNOSIS — Z7951 Long term (current) use of inhaled steroids: Secondary | ICD-10-CM | POA: Insufficient documentation

## 2017-10-31 DIAGNOSIS — Z833 Family history of diabetes mellitus: Secondary | ICD-10-CM | POA: Insufficient documentation

## 2017-10-31 DIAGNOSIS — E278 Other specified disorders of adrenal gland: Secondary | ICD-10-CM

## 2017-10-31 LAB — GLUCOSE, POCT (MANUAL RESULT ENTRY): POC Glucose: 131 mg/dL — AB (ref 70–99)

## 2017-10-31 LAB — POCT GLYCOSYLATED HEMOGLOBIN (HGB A1C): Hemoglobin A1C: 5.6

## 2017-10-31 MED ORDER — FLUTICASONE-SALMETEROL 250-50 MCG/DOSE IN AEPB: 1.0000 | INHALATION_SPRAY | Freq: Two times a day (BID) | RESPIRATORY_TRACT | 7 refills | Status: DC

## 2017-10-31 MED ORDER — LORATADINE 10 MG PO TABS: 10.0000 mg | ORAL_TABLET | Freq: Every day | ORAL | 11 refills | Status: DC

## 2017-10-31 MED FILL — BuPROPion HCL ER (XL) 300 M: 300 | 30 days supply | Qty: 30 | Fill #0

## 2017-10-31 MED FILL — AMITRIPTYLINE HCL 50 MG TAB: 50 | 30 days supply | Qty: 30 | Fill #0

## 2017-10-31 MED FILL — CLEAR-ATADINE 10 MG TABLET: 10 | 30 days supply | Qty: 30 | Fill #0

## 2017-10-31 MED FILL — ADVAIR 250/50 DISKUS: 250-50 | 30 days supply | Qty: 60 | Fill #0

## 2017-10-31 MED FILL — CITALOPRAM HBR 40 MG TABLET: 40 | 30 days supply | Qty: 30 | Fill #0

## 2017-10-31 NOTE — Progress Notes (Signed)
Patient ID: Melinda [name], female    DOB: 1965-08-14  MRN: [id number]  CC: Diabetes   Subjective: Melinda [name] is a 52 y.o. female who presents for chronic ds management. Her concerns today include:  52 year old with history ofDM (followed by Dr. Jana Hakim, moderate persistent asthma,migraines followed by neurology, uterine fibroids followed by GYN, papillary thyroid CA s/pthyroidectomy, history of DVT/PE onEliquislifetime,obesity and left shoulder pain.  1.  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  Had CT scan of abdomen done 2/201 7 that revealed right adrenal mass questionably lipomatous lesion and 2 cm liver mass.  MRI was recommended.    MRI ordered on last visit however patient states she did not have it done because when she went to radiology she was told that it was not approved by insurance.  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 3.  DM:  appt with her endocrinologist  on 11/05/2017 -gets low BS symptoms when BS around 70.  Does not occur often Doing well with eating habits  4.  Will be going to [place] and [place] in the next mth.  She is concerned about outbreak of measles and wondering whether she needs to have a measles vaccine.  As far she recalls he did get all of her childhood vaccines.  5.  Asthma:  Acting up due to pollen.  Has had increased wheezing and some cough On Singulair and Flonase  6.  Uterine Fibroids:  Weaned off Megace by gynecology.  she has lost about 7 pounds since being off of the medication Patient Active Problem List   Diagnosis Date Noted  . Left shoulder pain 10/14/2016  . Hypertension 10/14/2016  . S/P thyroidectomy 06/27/2016  . Chronic bilateral low back pain without sciatica 04/22/2016  . Liver  lesion 12/24/2015  . Papillary thyroid carcinoma ([place]) 12/21/2015  . Controlled type 2 diabetes mellitus with complication, with long-term current use of insulin ([place]) 10/10/2015  . Right leg DVT ([place]) 10/10/2015  . Vision loss 08/25/2015  . Decreased vision in both eyes 08/25/2015  . Prolonged Q-T interval on ECG 08/19/2015  . History of pulmonary embolism 08/10/2015  . Moderate persistent asthma 04/13/2015  . Fibroid, uterine   . Morbid obesity ([place]) 11/11/2014  . Sinus tachycardia 11/08/2014  . Migraine 10/14/2014  . Menorrhagia 09/08/2014     Current Outpatient Medications on File Prior to Visit  Medication Sig Dispense Refill  . ACCU-CHEK AVIVA PLUS test strip USE TO TEST BLOOD GLUCOSE FOUR TIMES DAILY BEFORE MEALS AND AT BEDTIME 100 each 12  . albuterol (PROVENTIL) (2.5 MG/3ML) 0.083% nebulizer solution TAKE 3 MLS (2.5 MG TOTAL) BY NEBULIZATION EVERY 6 HOURS AS NEEDED FORWHEEZING OR SHORTNESS OF BREATH 150 mL 1  . amitriptyline (ELAVIL) 50 MG tablet Take 50 mg by mouth at bedtime.    Marland Kitchen atorvastatin (LIPITOR) 20 MG tablet Take 1 tablet (20 mg total) by mouth daily. 90 tablet 1  . buPROPion (WELLBUTRIN XL) 300 MG 24 hr tablet Take 300 mg by mouth daily.   2  . citalopram (CELEXA) 20 MG tablet   2  . dicyclomine (BENTYL) 20 MG tablet Take 1 tablet (20 mg total) by mouth 3 (three)  times daily as needed for spasms. 30 tablet 0  . ELIQUIS 5 MG TABS tablet TAKE 1 TABLET BY MOUTH TWICE DAILY 60 tablet 0  . fluticasone (CUTIVATE) 0.05 % cream Apply topically 2 (two) times daily. 30 g 2  . fluticasone (FLONASE) 50 MCG/ACT nasal spray PLACE 2 SPRAYS INTO BOTH NOSTRILS DAILY (Patient taking differently: Place 2 sprays into both nostrils daily as needed for allergies. ) 16 g 2  . Fluticasone-Salmeterol (ADVAIR DISKUS) 100-50 MCG/DOSE AEPB Inhale 1 puff into the lungs 2 (two) times daily. (Patient taking differently: Inhale 2 puffs into the lungs at bedtime. ) 60 each 2  . gabapentin (NEURONTIN)  100 MG capsule TAKE 1 CAPSULE BY MOUTH THREE TIMES A DAY 90 capsule 2  . hydrochlorothiazide (MICROZIDE) 12.5 MG capsule TAKE 1 CAPSULE BY MOUTH ONCE DAILY 30 capsule 2  . insulin lispro (HUMALOG) 100 UNIT/ML injection Inject 6 Units into the skin 3 (three) times daily with meals. 8 U at beginning of meal in V-Go 30, up to 100 units/day     . LANTUS SOLOSTAR 100 UNIT/ML Solostar Pen Inject 24 Units into the skin daily at 10 pm.   0  . metFORMIN (GLUCOPHAGE) 850 MG tablet Take 1 tablet (850 mg total) by mouth 2 (two) times daily with a meal. 180 tablet 3  . methocarbamol (ROBAXIN) 500 MG tablet TAKE 1 TABLET BY MOUTH EVERY 8 HOURS AS NEEDED FOR MUSCLE SPASMS 40 tablet 0  . metoprolol tartrate (LOPRESSOR) 50 MG tablet TAKE 1 TABLET BY MOUTH TWICE DAILY 60 tablet 2  . montelukast (SINGULAIR) 10 MG tablet TAKE 1 TABLET BY MOUTH EVERY NIGHT AT BEDTIME 30 tablet 2  . ondansetron (ZOFRAN ODT) 4 MG disintegrating tablet Take 1 tablet (4 mg total) by mouth every 8 (eight) hours as needed for nausea or vomiting. 3 tablet 0  . prazosin (MINIPRESS) 1 MG capsule Take 1 mg by mouth at bedtime.    Marland Kitchen PROAIR HFA 108 (90 Base) MCG/ACT inhaler INHALE 2 PUFFS INTO THE LUNGS EVERY 6 HOURS AS NEEDED FOR WHEEZING ORSHORTNESS OF BREATH 8.5 g 1  . rizatriptan (MAXALT) 10 MG tablet Take 1 tablet earliest onset of headache.  May repeat once after 2 hours if needed 10 tablet 3  . SYNTHROID 200 MCG tablet TAKE 1 TABLET BY MOUTH EVERY MORNING ALONG WITH 25 MCG FOR A TOTAL OF 225MCG DAILY 30 tablet 0  . topiramate (TOPAMAX) 25 MG tablet TAKE 3 TABLETS BY MOUTH EVERY NIGHT AT BEDTIME 90 tablet 1  . traMADol (ULTRAM) 50 MG tablet Take 1 tablet (50 mg total) by mouth every 8 (eight) hours as needed for moderate pain or severe pain. 60 tablet 0  . diclofenac sodium (VOLTAREN) 1 % GEL Apply 4 g topically 4 (four) times daily. To neck and left shoulder (Patient not taking: Reported on 09/19/2017) 100 g 5  . megestrol (MEGACE) 40 MG  tablet Take 2 tablets (80 mg total) by mouth 2 (two) times daily. (Patient not taking: Reported on 10/31/2017) 60 tablet 0  . megestrol (MEGACE) 40 MG tablet Take 1 tablet (40 mg total) by mouth 2 (two) times daily. (Patient not taking: Reported on 10/31/2017) 60 tablet 1   No current facility-administered medications on file prior to visit.     Allergies  Allergen Reactions  . Ceftriaxone Anaphylaxis    ROCEPHIN  . Penicillins Shortness Of Breath    Has patient had a PCN reaction causing immediate rash, facial/tongue/throat swelling, SOB or lightheadedness with  hypotension: Yes Has patient had a PCN reaction causing severe rash involving mucus membranes or skin necrosis: No Has patient had a PCN reaction that required hospitalization No Has patient had a PCN reaction occurring within the last 10 years: No If all of the above answers are "NO", then may proceed with Cephalosporin use.   . Shellfish Allergy Anaphylaxis  . Gold-Containing Drug Products     HANDS ITCH  . [place]     HANDS SWELL  . [place] Rash    Social History   Socioeconomic History  . Marital status: Single    Spouse name: Not on file  . Number of children: Not on file  . Years of education: Not on file  . Highest education level: Not on file  Occupational History  . Not on file  Social Needs  . Financial resource strain: Not on file  . Food insecurity:    Worry: Not on file    Inability: Not on file  . Transportation needs:    Medical: Not on file    Non-medical: Not on file  Tobacco Use  . Smoking status: Former Smoker    Packs/day: 0.50    Years: 40.00    Pack years: 20.00    Types: Cigarettes    Last attempt to quit: 11/03/2014    Years since quitting: 2.9  . Smokeless tobacco: Never Used  Substance and Sexual Activity  . Alcohol use: No    Alcohol/week: 0.0 oz  . Drug use: No  . Sexual activity: Never    Birth control/protection: None  Lifestyle  . Physical activity:    Days per week: Not on  file    Minutes per session: Not on file  . Stress: Not on file  Relationships  . Social connections:    Talks on phone: Not on file    Gets together: Not on file    Attends religious service: Not on file    Active member of club or organization: Not on file    Attends meetings of clubs or organizations: Not on file    Relationship status: Not on file  . Intimate partner violence:    Fear of current or ex partner: Not on file    Emotionally abused: Not on file    Physically abused: Not on file    Forced sexual activity: Not on file  Other Topics Concern  . Not on file  Social History Narrative  . Not on file    Family History  Problem Relation Age of Onset  . Diabetes Mother   . Breast cancer Mother   . CAD Mother   . Hypertension Mother   . Alcohol abuse Father   . Hypertension Father     Past Surgical History:  Procedure Laterality Date  . ANGIOGRAM TO LEG  08-13-15   RIGHT  . CHOLECYSTECTOMY    . IR GENERIC HISTORICAL  04/04/2016   IR RADIOLOGIST EVAL & MGMT 04/04/2016 [name], MD [facility] INTERV RAD  . THYROIDECTOMY N/A 11/17/2015   Procedure: TOTAL THYROIDECTOMY;  Surgeon: Armandina Gemma, MD;  Location: [facility];  Service: General;  Laterality: N/A;  . UTERINE ARTERY EMBOLIZATION Bilateral 08/13/2015    ROS: Review of Systems Negative except as stated above PHYSICAL EXAM: BP 119/72   Pulse 100   Temp 98.3 F (36.8 C)   Resp 18   Ht '5\' 10"'  (1.778 m)   Wt (!) 373 lb 9.6 oz (169.5 kg)   SpO2 93%   BMI  53.61 kg/m   Wt Readings from Last 3 Encounters:  10/31/17 (!) 373 lb 9.6 oz (169.5 kg)  09/30/17 (!) 382 lb 12.8 oz (173.6 kg)  09/24/17 (!) 386 lb 6.6 oz (175.3 kg)    Physical Exam General appearance - alert, well appearing, and in no distress Mental status - alert, oriented to person, place, and time Neck - supple, no significant adenopathy Chest -few scattered wheezing with good air entry Heart - normal rate, regular rhythm, normal S1, S2, no murmurs,  rubs, clicks or gallops Extremities - peripheral pulses normal, no pedal edema, no clubbing or cyanosis Diabetic Foot Exam - Simple   Simple Foot Form Visual Inspection See comments:  Yes Sensation Testing Intact to touch and monofilament testing bilaterally:  Yes Pulse Check Posterior Tibialis and Dorsalis pulse intact bilaterally:  Yes Comments Very dry skin on the soles.  Callus around the heels      Results for orders placed or performed in visit on 10/31/17  Glucose (CBG)  Result Value Ref Range   POC Glucose 131 (A) 70 - 99 mg/dl  HgB A1c  Result Value Ref Range   Hemoglobin A1C 5.6      Chemistry      Component Value Date/Time   NA 143 09/19/2017 1810   NA 143 02/03/2017 1028   K 3.6 09/19/2017 1810   CL 109 09/19/2017 1810   CO2 25 09/19/2017 1810   BUN 13 09/19/2017 1810   BUN 12 02/03/2017 1028   CREATININE 1.01 (H) 09/19/2017 1810   CREATININE 0.72 06/27/2016 1215      Component Value Date/Time   CALCIUM 8.5 (L) 09/19/2017 1810   ALKPHOS 92 09/19/2017 1810   AST 61 (H) 09/19/2017 1810   ALT 64 (H) 09/19/2017 1810   BILITOT 0.5 09/19/2017 1810   BILITOT <0.2 02/03/2017 1028     ASSESSMENT AND PLAN: 1. Controlled type 2 diabetes mellitus with complication, with long-term current use of insulin ([place]) Well-controlled.  Continue current medications and healthy eating habits.  She plans to keep appointment with her endocrinologist. - Glucose (CBG) - HgB A1c  2. Moderate persistent asthma with exacerbation Not well controlled due to allergic rhinitis. Add Claritin.  Increased dose on Advair. - Fluticasone-Salmeterol (ADVAIR DISKUS) 250-50 MCG/DOSE AEPB; Inhale 1 puff into the lungs 2 (two) times daily.  Dispense: 60 each; Refill: 7  3. Abnormal LFTs AST and ALT noted to be elevated on labs done in the ER back in March.  We will repeat the studies.  She is on Lipitor. - Hepatic Function Panel; Future  4. Adrenal mass, right ([facility]) Stable and appears to  be a lipomatous mass  5. Measles, mumps, rubella (MMR) vaccination status unknown Patient advised that people born after 1955 are presumed to be adequately immunized if they did receive the recommended immunizations.  However if she does want to have blood work done to see if she was adequately immunized we can go ahead and order it.  If she is not adequately immunized based on the results she can get MMR vaccine through the health department - [place] antibody IgG; Future  6. Pre-ulcerative corn or callous - Ambulatory referral to Podiatry  7. Seasonal allergic rhinitis due to pollen - loratadine (CLARITIN) 10 MG tablet; Take 1 tablet (10 mg total) by mouth daily.  Dispense: 30 tablet; Refill: 11   Patient was given the opportunity to ask questions.  Patient verbalized understanding of the plan and was able to repeat key elements  of the plan.   Orders Placed This Encounter  Procedures  . Hemoglobin A1c  . Glucose (CBG)     Requested Prescriptions    No prescriptions requested or ordered in this encounter    No follow-ups on file.  [name], MD, FACP

## 2017-11-04 ENCOUNTER — Other Ambulatory Visit: Payer: Self-pay | Admitting: Internal Medicine

## 2017-11-06 MED FILL — ELIQUIS 5 MG TABLET: 5 | 30 days supply | Qty: 60 | Fill #0

## 2017-11-10 MED FILL — SYNTHROID 150 MCG TABLET: 150 | 30 days supply | Qty: 30 | Fill #0

## 2017-11-27 ENCOUNTER — Other Ambulatory Visit: Payer: Self-pay | Admitting: Internal Medicine

## 2017-11-27 DIAGNOSIS — M545 Low back pain: Principal | ICD-10-CM

## 2017-11-27 DIAGNOSIS — G8929 Other chronic pain: Secondary | ICD-10-CM

## 2017-11-27 MED FILL — GABAPENTIN 100 MG CAPSULE: 100 | 30 days supply | Qty: 90 | Fill #0

## 2017-11-27 MED FILL — MONTELUKAST SOD 10 MG TAB: 10 | 30 days supply | Qty: 30 | Fill #2

## 2017-11-27 MED FILL — HYDROCHLOROTHIAZIDE 12.5 MG: 12.5 | 30 days supply | Qty: 30 | Fill #1

## 2017-11-27 MED FILL — METOPROLOL TARTRATE 50 MG T: 50 | 30 days supply | Qty: 60 | Fill #1

## 2017-11-27 MED FILL — ATORVASTATIN 20 MG TABLET: 20 | 30 days supply | Qty: 30 | Fill #3

## 2017-11-27 MED FILL — metFORMIN HCL 850 MG TABS: 850 | 90 days supply | Qty: 180 | Fill #1

## 2017-11-27 MED FILL — TOPIRAMATE 25 MG TABLET: 25 | 30 days supply | Qty: 90 | Fill #1

## 2017-11-27 MED FILL — CLEAR-ATADINE 10 MG TABLET: 10 | 30 days supply | Qty: 30 | Fill #1

## 2017-12-01 MED FILL — METHOCARBAMOL 500 MG TABLET: 500 | 13 days supply | Qty: 40 | Fill #0

## 2017-12-01 MED FILL — CITALOPRAM HBR 40 MG TABLET: 40 | 30 days supply | Qty: 30 | Fill #1

## 2017-12-01 MED FILL — ELIQUIS 5 MG TABLET: 5 | 30 days supply | Qty: 60 | Fill #1

## 2017-12-03 ENCOUNTER — Other Ambulatory Visit: Payer: Self-pay | Admitting: Internal Medicine

## 2017-12-03 DIAGNOSIS — Z1231 Encounter for screening mammogram for malignant neoplasm of breast: Secondary | ICD-10-CM

## 2017-12-05 ENCOUNTER — Ambulatory Visit: Payer: Medicaid Other | Admitting: Podiatry

## 2017-12-05 ENCOUNTER — Encounter: Payer: Self-pay | Admitting: Podiatry

## 2017-12-05 VITALS — BP 135/81 | HR 91

## 2017-12-05 DIAGNOSIS — M79675 Pain in left toe(s): Secondary | ICD-10-CM

## 2017-12-05 DIAGNOSIS — M79674 Pain in right toe(s): Secondary | ICD-10-CM | POA: Diagnosis not present

## 2017-12-05 DIAGNOSIS — B351 Tinea unguium: Secondary | ICD-10-CM | POA: Diagnosis not present

## 2017-12-05 DIAGNOSIS — D689 Coagulation defect, unspecified: Secondary | ICD-10-CM

## 2017-12-05 DIAGNOSIS — E1142 Type 2 diabetes mellitus with diabetic polyneuropathy: Secondary | ICD-10-CM

## 2017-12-05 NOTE — Progress Notes (Signed)
This patient presents to the office with chief complaint of long thick nails and diabetic feet.  This patient  says there  is  no pain and discomfort in their feet.  This patient says there are long thick painful nails.  These nails are painful walking and wearing shoes.  Patient has no history of infection or drainage from both feet.  Patient is unable to  self treat his own nails . This patient presents  to the office today for treatment of the  long nails and a foot evaluation due to history of  diabetes.  General Appearance  Alert, conversant and in no acute stress.  Vascular  Dorsalis pedis and posterior tibial  pulses are palpable  bilaterally.  Capillary return is within normal limits  bilaterally. Temperature is within normal limits  bilaterally.  Neurologic  Senn-Weinstein monofilament wire diminished  bilaterally. Muscle power within normal limits bilaterally.  Nails Thick disfigured discolored nails with subungual debris  from hallux to fifth toes bilaterally. No evidence of bacterial infection or drainage bilaterally.  Orthopedic  No limitations of motion of motion feet .  No crepitus or effusions noted.  No bony pathology or digital deformities noted.  Skin  normotropic skin with no porokeratosis noted bilaterally.  No signs of infections or ulcers noted.     Onychomycosis  Diabetes with no foot complications  IE  Debride nails x 10.  A diabetic foot exam was performed and there is no evidence of any vascular pathology.   Neurologic pathology noted. RTC 3 months.   Gardiner Barefoot DPM

## 2017-12-08 MED FILL — buPROPion HCL ER (XL) 300 M: 300 | 30 days supply | Qty: 30 | Fill #1

## 2017-12-08 MED FILL — SYNTHROID 150 MCG TABLET: 150 | 30 days supply | Qty: 30 | Fill #1

## 2017-12-08 MED FILL — AMITRIPTYLINE HCL 50 MG TAB: 50 | 30 days supply | Qty: 30 | Fill #1

## 2017-12-12 ENCOUNTER — Encounter: Payer: Self-pay | Admitting: Neurology

## 2017-12-12 ENCOUNTER — Ambulatory Visit: Payer: Medicaid Other | Admitting: Neurology

## 2017-12-12 ENCOUNTER — Other Ambulatory Visit: Payer: Self-pay

## 2017-12-12 VITALS — BP 130/82 | HR 91 | Ht 70.0 in | Wt 367.0 lb

## 2017-12-12 DIAGNOSIS — G43009 Migraine without aura, not intractable, without status migrainosus: Secondary | ICD-10-CM

## 2017-12-12 MED ORDER — TOPIRAMATE 100 MG PO TABS: 100.0000 mg | ORAL_TABLET | Freq: Every day | ORAL | 3 refills | Status: DC

## 2017-12-12 MED ORDER — RIZATRIPTAN BENZOATE 10 MG PO TABS: ORAL_TABLET | ORAL | 3 refills | Status: DC

## 2017-12-12 MED ORDER — ONDANSETRON 4 MG PO TBDP: 4.0000 mg | ORAL_TABLET | Freq: Three times a day (TID) | ORAL | 3 refills | Status: DC | PRN

## 2017-12-12 MED FILL — TOPIRAMATE 100 MG TABS: 100 | 30 days supply | Qty: 30 | Fill #0

## 2017-12-12 MED FILL — RIZATRIPTAN BENZOATE 10 MG: 10 | 30 days supply | Qty: 10 | Fill #0

## 2017-12-12 NOTE — Progress Notes (Signed)
NEUROLOGY FOLLOW UP OFFICE NOTE  Melinda [name] [id number]  HISTORY OF PRESENT ILLNESS: Melinda [name] is a 51 year old right-handed female with type 2 diabetes, asthma, iron deficiency anemia, chronic low back pain, PE/DVT, PTSD, and history of thyroid cancer status post thyroidectomy who follows up for migraine.   UPDATE: Headaches have increased. Intensity:  8.5/10 Duration:  45 minutes with Maxalt (reduces intensity to manageable 4/10 for another couple of hours) Frequency:  5 days a week Frequency of abortive medication: daily Current NSAIDS:  Contraindicated (on anticoagulation) Current analgesics:  acetaminophen, tramadol (daily Current triptans:  Maxalt 10mg  Current anti-emetic: Zofran ODT 4mg  Current muscle relaxants: methocarbamol Current anti-anxiolytic:  no Current sleep aide:  no Current Antihypertensive medications:  metoprolol 50mg  twice daily, HCTZ Current Antidepressant medications:  Amitriptyline 50mg , bupropion 150mg , citalopram 10mg  Current Anticonvulsant medications:  topiramate 75mg , gabapentin 100mg  three times daily Current Vitamins/Herbal/Supplements:  ferrous sulfate Current Antihistamines/Decongestants:  Flonase Other therapy:  Sees psychologist and psychiatrist, which helps.    Caffeine:  no Alcohol:  no Smoker:  no Diet:  Hydrates.  No soda.  Eats baked and broiled chicken.  No fried foods Exercise:  Stationary bike (limited due to chronic pain) Depression/anxiety:  Both increased since threat of eviction.  Sleep:  Poor.  Has night terrors secondary to PTSD.  She saw Dr. Hassell Done, neuro-ophthalmologist, in March.  She was told that her monocular diplopia was due to her dry eyes.   HISTORY:  Onset:  For over 20 years but became daily 4 months. Location:  Right sided Quality:  squeezing Initial Intensity:  8.5/10 (sometimes 10/10); May:  8.5/10 Aura:  Sometimes preceded with visual aura (sees a pinwheel) Prodrome:  no Associated symptoms:  Nausea,  photophobia, phonophobia.  No worse headache of life, change in quality, wakes her up from sleep. Initial Duration:  3 hours to all day; May: 45 minutes with Maxalt (reduces intensity to manageable 4/10 for another couple of hours) Initial Frequency:  daily; May:  5 to 10 days per month in a cluster during her cycle Triggers/exacerbating factors:  Apples, aged cheese, menstrual cycle Relieving factors:  no Activity:  Light activity aggravates   Past NSAIDS:  ibuprofen Past analgesics:  Excedrin Past abortive triptans:  Sumatriptan tablet/NS/Avalon effective in past but 50mg  tablet no longer effective for this daily headache. Past muscle relaxants:  no Past anti-emetic:  no Past antihypertensive medications:  no Past antidepressant medications:  no Past anticonvulsant medications:  no Past vitamins/Herbal/Supplements:  no Past antihistamines/decongestants:  no Other past therapies:  no   Family history of headache:  Son has migraines.   CT of head from 01/16/15 was unremarkable.   In September 2018, she developed a "thunderclap" headache.  Onset was over a month ago.  It lasts for a couple of seconds and occurs once a week.  She has not had any vision loss, unilateral numbness or weakness or vertigo.  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    PAST MEDICAL HISTORY: Past Medical History:  Diagnosis Date  . ADHD (attention deficit hyperactivity disorder)   . Arthritis   . Asthma   . Cancer ([place])   . Chicken pox AGE 28  . Diabetes mellitus without complication ([place])   . GERD (gastroesophageal reflux disease)   . Glaucoma    BOTH EYES, NO EYE DROPS  . Glaucoma   . History of  blood transfusion MARCH 2016    2UNITS GIVEN AND IRON GIVEN  . Hyperglycemia 09/09/2014  . Hypertension   . 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 Iron deficiency anemia due to chronic blood loss 09/09/2014  . Low TSH level 09/08/2014  . Lumbar herniated disc   . Menorrhagia 09/08/2014  . Migraine    CLUSTER AND MIGRAINES  . Multiple thyroid nodules   . Ovarian mass, right 09/09/2014  . PE (pulmonary embolism) 2016  . Peripheral neuropathy    FINGER TIPS AND TOES NUMB SOME  . Preeclampsia  1983   WITH PREGNANCY  . PTSD (post-traumatic stress disorder)   . Scoliosis   . Seizures ([place]) AGE 83   NONE SINCE, HAD CHICKEN POX THEN    MEDICATIONS: Current Outpatient Medications on File Prior to Visit  Medication Sig Dispense Refill  . ACCU-CHEK AVIVA PLUS test strip USE TO TEST BLOOD GLUCOSE FOUR TIMES DAILY BEFORE MEALS AND AT BEDTIME 100 each 12  . ACCU-CHEK SOFTCLIX LANCETS lancets   3  . albuterol (PROVENTIL) (2.5 MG/3ML) 0.083% nebulizer solution TAKE 3 MLS (2.5 MG TOTAL) BY NEBULIZATION EVERY 6 HOURS AS NEEDED FORWHEEZING OR SHORTNESS OF BREATH 150 mL 1  . amitriptyline (ELAVIL) 50 MG tablet Take 50 mg by mouth at bedtime.    Marland Kitchen atorvastatin (LIPITOR) 20 MG tablet Take 1 tablet (20 mg total) by mouth daily. 90 tablet 1  . buPROPion (WELLBUTRIN XL) 300 MG 24 hr tablet Take 300 mg by mouth daily.   2  . citalopram (CELEXA) 40 MG tablet   2  . dicyclomine (BENTYL) 20 MG tablet Take 1 tablet (20 mg total) by mouth 3 (three) times daily as needed for spasms. 30 tablet 0  . ELIQUIS 5 MG TABS tablet TAKE 1 TABLET BY MOUTH TWICE DAILY 60 tablet 6  . fluticasone (CUTIVATE) 0.05 % cream Apply topically 2 (two) times daily. 30 g 2  . fluticasone (FLONASE) 50 MCG/ACT nasal spray PLACE 2 SPRAYS INTO BOTH NOSTRILS DAILY (Patient taking differently: Place 2 sprays into both nostrils daily as needed for allergies. ) 16 g 2  . Fluticasone-Salmeterol (ADVAIR DISKUS) 250-50 MCG/DOSE AEPB Inhale 1 puff into the lungs 2 (two) times daily. 60 each 7  . gabapentin (NEURONTIN) 100 MG capsule TAKE 1 CAPSULE BY  MOUTH THREE TIMES A DAY 90 capsule 2  . hydrochlorothiazide (MICROZIDE) 12.5 MG capsule TAKE 1 CAPSULE BY MOUTH ONCE DAILY 30 capsule 2  . insulin lispro (HUMALOG) 100 UNIT/ML injection Inject 6 Units into the skin 3 (three) times daily with meals. 8 U at beginning of meal in V-Go 30, up to 100 units/day     . LANTUS SOLOSTAR 100 UNIT/ML Solostar Pen Inject 24 Units into the skin daily at 10 pm.   0  . loratadine (CLARITIN) 10 MG tablet Take 1 tablet (10 mg total) by mouth daily. 30 tablet 11  . metFORMIN (GLUCOPHAGE) 850 MG tablet Take 1 tablet (850 mg total) by mouth 2 (two) times daily with a meal. 180 tablet 3  . methocarbamol (ROBAXIN) 500 MG tablet TAKE 1 TABLET BY MOUTH EVERY 8 HOURS AS NEEDED FOR MUSCLE SPASMS 40 tablet 2  . metoprolol tartrate (LOPRESSOR) 50 MG tablet TAKE 1 TABLET BY MOUTH TWICE DAILY 60 tablet 2  . montelukast (SINGULAIR) 10 MG tablet TAKE 1 TABLET BY MOUTH EVERY NIGHT AT BEDTIME 30 tablet 2  .  prazosin (MINIPRESS) 1 MG capsule Take 1 mg by mouth at bedtime.    Marland Kitchen SYNTHROID 150 MCG tablet   6  . traMADol (ULTRAM) 50 MG tablet Take 1 tablet (50 mg total) by mouth every 8 (eight) hours as needed for moderate pain or severe pain. 60 tablet 0  . UNIFINE PENTIPS 32G X 4 MM MISC   0   No current facility-administered medications on file prior to visit.     ALLERGIES: Allergies  Allergen Reactions  . Ceftriaxone Anaphylaxis    ROCEPHIN  . Penicillins Shortness Of Breath    Has patient had a PCN reaction causing immediate rash, facial/tongue/throat swelling, SOB or lightheadedness with hypotension: Yes Has patient had a PCN reaction causing severe rash involving mucus membranes or skin necrosis: No Has patient had a PCN reaction that required hospitalization No Has patient had a PCN reaction occurring within the last 10 years: No If all of the above answers are "NO", then may proceed with Cephalosporin use.   . Shellfish Allergy Anaphylaxis  . Gold-Containing Drug  Products     HANDS ITCH  . Nick[place]     HANDS SWELL  . Citrus Rash    FAMILY HISTORY: Family History  Problem Relation Age of Onset  . Diabetes Mother   . Breast cancer Mother   . CAD Mother   . Hypertension Mother   . Alcohol abuse Father   . Hypertension Father     SOCIAL HISTORY: Social History   Socioeconomic History  . Marital status: Single    Spouse name: Not on file  . Number of children: Not on file  . Years of education: Not on file  . Highest education level: Not on file  Occupational History  . Not on file  Social Needs  . Financial resource strain: Not on file  . Food insecurity:    Worry: Not on file    Inability: Not on file  . Transportation needs:    Medical: Not on file    Non-medical: Not on file  Tobacco Use  . Smoking status: Former Smoker    Packs/day: 0.50    Years: 40.00    Pack years: 20.00    Types: Cigarettes    Last attempt to quit: 11/03/2014    Years since quitting: 3.1  . Smokeless tobacco: Never Used  Substance and Sexual Activity  . Alcohol use: No    Alcohol/week: 0.0 oz  . Drug use: No  . Sexual activity: Never    Birth control/protection: None  Lifestyle  . Physical activity:    Days per week: Not on file    Minutes per session: Not on file  . Stress: Not on file  Relationships  . Social connections:    Talks on phone: Not on file    Gets together: Not on file    Attends religious service: Not on file    Active member of club or organization: Not on file    Attends meetings of clubs or organizations: Not on file    Relationship status: Not on file  . Intimate partner violence:    Fear of current or ex partner: Not on file    Emotionally abused: Not on file    Physically abused: Not on file    Forced sexual activity: Not on file  Other Topics Concern  . Not on file  Social History Narrative  . Not on file    REVIEW OF SYSTEMS: Constitutional: No fevers, chills, or sweats,  no generalized fatigue, change in  appetite Eyes: No visual changes, double vision, eye pain Ear, nose and throat: No hearing loss, ear pain, nasal congestion, sore throat Cardiovascular: No chest pain, palpitations Respiratory:  No shortness of breath at rest or with exertion, wheezes GastrointestinaI: No nausea, vomiting, diarrhea, abdominal pain, fecal incontinence Genitourinary:  No dysuria, urinary retention or frequency Musculoskeletal:  No neck pain, back pain Integumentary: No rash, pruritus, skin lesions Neurological: as above Psychiatric: No depression, insomnia, anxiety Endocrine: No palpitations, fatigue, diaphoresis, mood swings, change in appetite, change in weight, increased thirst Hematologic/Lymphatic:  No purpura, petechiae. Allergic/Immunologic: no itchy/runny eyes, nasal congestion, recent allergic reactions, rashes  PHYSICAL EXAM: Vitals:   12/12/17 1233  BP: 130/82  Pulse: 91  SpO2: 96%   General: No acute distress.  Morbidly obese Head:  Normocephalic/atraumatic Eyes:  Fundi examined but not visualized Neck: supple, no paraspinal tenderness, full range of motion Heart:  Regular rate and rhythm Lungs:  Clear to auscultation bilaterally Back: No paraspinal tenderness Neurological Exam: alert and oriented to person, place, and time. Attention span and concentration intact, recent and remote memory intact, fund of knowledge intact.  Speech fluent and not dysarthric, language intact.  CN II-XII intact. Bulk and tone normal, muscle strength 5/5 throughout.  Sensation to light touch  intact.  Deep tendon reflexes absent throughout.  Finger to nose testing intact.  Waddling gait. Ambulates with cane.   IMPRESSION: Migraine without aura, not intractable  PLAN: 1.  Increase topiramate to 100mg  at bedtime.  Contact me in 4 weeks and we can increase dose if needed. 2.  Rizatriptan and ondansetron refilled 3.  Limit use of pain relievers to no more than 2 days out of week 4.Keep headache  diary 5.Weight loss 6.  Follow up in 3 to 4 months.  Metta Clines, DO  CC:  Dr. Wynetta Emery

## 2017-12-12 NOTE — Patient Instructions (Signed)
1.  Increase topiramate to 100mg  at bedtime.  Contact me in 4 weeks and we can increase dose if needed. 2.  Rizatriptan and ondansetron refilled 3.  Follow up in 3 to 4 months.

## 2017-12-23 LAB — HM DIABETES EYE EXAM

## 2017-12-25 ENCOUNTER — Other Ambulatory Visit: Payer: Self-pay | Admitting: Internal Medicine

## 2017-12-25 ENCOUNTER — Ambulatory Visit
Admission: RE | Admit: 2017-12-25 | Discharge: 2017-12-25 | Disposition: A | Discharge status: Home / Self Care | Payer: Medicaid Other | Source: Ambulatory Visit | Attending: Internal Medicine | Admitting: Internal Medicine

## 2017-12-25 DIAGNOSIS — Z1231 Encounter for screening mammogram for malignant neoplasm of breast: Secondary | ICD-10-CM

## 2017-12-25 DIAGNOSIS — J4550 Severe persistent asthma, uncomplicated: Secondary | ICD-10-CM

## 2017-12-25 MED FILL — CITALOPRAM HBR 40 MG TABLET: 40 | 30 days supply | Qty: 30 | Fill #2

## 2017-12-25 MED FILL — METHOCARBAMOL 500 MG TABLET: 500 | 13 days supply | Qty: 40 | Fill #1

## 2017-12-25 MED FILL — HUMALOG 100 UNITS/ML KWIKPE: 100 | 25 days supply | Qty: 6 | Fill #0

## 2017-12-25 MED FILL — HYDROCHLOROTHIAZIDE 12.5 MG: 12.5 | 30 days supply | Qty: 30 | Fill #2

## 2017-12-25 MED FILL — LANTUS SOLOSTAR 100 UNITS/M: 100 | 25 days supply | Qty: 6 | Fill #0

## 2017-12-25 MED FILL — ATORVASTATIN CALCIUM 20 MG: 20 | 30 days supply | Qty: 30 | Fill #4

## 2017-12-25 MED FILL — MONTELUKAST SOD 10 MG TAB: 10 | 30 days supply | Qty: 30 | Fill #0

## 2017-12-25 MED FILL — METOPROLOL TARTRATE 50 MG T: 50 | 30 days supply | Qty: 60 | Fill #2

## 2017-12-25 MED FILL — PRAZOSIN 1 MG CAPSULE: 1 | 30 days supply | Qty: 30 | Fill #0

## 2017-12-25 MED FILL — CLEAR-ATADINE 10 MG TABLET: 10 | 30 days supply | Qty: 30 | Fill #2

## 2017-12-29 DIAGNOSIS — F411 Generalized anxiety disorder: Secondary | ICD-10-CM | POA: Diagnosis not present

## 2017-12-29 MED FILL — FLUTICASONE PROP 50 MCG SPR: 50 | 30 days supply | Qty: 16 | Fill #0

## 2017-12-29 MED FILL — ADVAIR 250/50 DISKUS: 250-50 | 30 days supply | Qty: 60 | Fill #1

## 2017-12-29 MED FILL — GABAPENTIN 100 MG CAPSULE: 100 | 30 days supply | Qty: 90 | Fill #1

## 2017-12-29 MED FILL — ELIQUIS 5 MG TABLET: 5 | 30 days supply | Qty: 60 | Fill #2

## 2017-12-30 DIAGNOSIS — E1165 Type 2 diabetes mellitus with hyperglycemia: Secondary | ICD-10-CM | POA: Diagnosis not present

## 2017-12-31 DIAGNOSIS — Z7689 Persons encountering health services in other specified circumstances: Secondary | ICD-10-CM | POA: Diagnosis not present

## 2017-12-31 DIAGNOSIS — F411 Generalized anxiety disorder: Secondary | ICD-10-CM | POA: Diagnosis not present

## 2018-01-07 DIAGNOSIS — F411 Generalized anxiety disorder: Secondary | ICD-10-CM | POA: Diagnosis not present

## 2018-01-08 ENCOUNTER — Telehealth: Payer: Self-pay

## 2018-01-08 DIAGNOSIS — G43009 Migraine without aura, not intractable, without status migrainosus: Secondary | ICD-10-CM

## 2018-01-08 NOTE — Telephone Encounter (Signed)
Pt called to let us know she is still having headaches, she is on day two of present headache. She wants to know if she could increase the topiramate. I advised her to take 1 1/2  Tabs(150mg ) QHS. Pt has taken rizatriptan without much relief. I advised her to call tomorrow if the headache is still present and we can have her come in for a headache cocktail. Pt states she does not have transportation.

## 2018-01-09 MED ORDER — ELETRIPTAN HYDROBROMIDE 40 MG PO TABS: 40.0000 mg | ORAL_TABLET | ORAL | 1 refills | Status: DC | PRN

## 2018-01-09 NOTE — Telephone Encounter (Signed)
Called and spoke with Pt, she would like to try Relpax

## 2018-01-09 NOTE — Addendum Note (Signed)
Addended by: Clois Comber on: 01/09/2018 03:49 PM   Modules accepted: Orders

## 2018-01-09 NOTE — Telephone Encounter (Signed)
For abortive therapy, instead of rizatriptan she may try Migranal spray.  If she would rather not take a nasal spray, then we can try Relpax 40mg .

## 2018-01-12 MED FILL — SYNTHROID 150 MCG TABLET: 150 | 30 days supply | Qty: 30 | Fill #2

## 2018-01-14 DIAGNOSIS — E890 Postprocedural hypothyroidism: Secondary | ICD-10-CM | POA: Diagnosis not present

## 2018-01-14 DIAGNOSIS — F411 Generalized anxiety disorder: Secondary | ICD-10-CM | POA: Diagnosis not present

## 2018-01-16 DIAGNOSIS — F4310 Post-traumatic stress disorder, unspecified: Secondary | ICD-10-CM | POA: Diagnosis not present

## 2018-01-16 DIAGNOSIS — F331 Major depressive disorder, recurrent, moderate: Secondary | ICD-10-CM | POA: Diagnosis not present

## 2018-01-16 DIAGNOSIS — F411 Generalized anxiety disorder: Secondary | ICD-10-CM | POA: Diagnosis not present

## 2018-01-16 MED FILL — busPIRone HCL 5 MG TABS: 5 | 30 days supply | Qty: 60 | Fill #0

## 2018-01-16 MED FILL — CITALOPRAM HBR 40 MG TABLET: 40 | 30 days supply | Qty: 30 | Fill #0

## 2018-01-16 MED FILL — buPROPion HCL ER (XL) 300 M: 300 | 30 days supply | Qty: 30 | Fill #0

## 2018-01-16 MED FILL — SYNTHROID 175 MCG TABLET: 175 | 30 days supply | Qty: 30 | Fill #0

## 2018-01-16 MED FILL — AMITRIPTYLINE HCL 50 MG TAB: 50 | 30 days supply | Qty: 30 | Fill #0

## 2018-01-20 DIAGNOSIS — F411 Generalized anxiety disorder: Secondary | ICD-10-CM | POA: Diagnosis not present

## 2018-01-21 ENCOUNTER — Ambulatory Visit: Payer: Medicaid Other | Admitting: Obstetrics and Gynecology

## 2018-01-21 ENCOUNTER — Encounter: Payer: Self-pay | Admitting: Obstetrics and Gynecology

## 2018-01-21 VITALS — Ht 70.0 in | Wt 351.2 lb

## 2018-01-21 DIAGNOSIS — N939 Abnormal uterine and vaginal bleeding, unspecified: Secondary | ICD-10-CM | POA: Diagnosis not present

## 2018-01-21 DIAGNOSIS — F411 Generalized anxiety disorder: Secondary | ICD-10-CM | POA: Diagnosis not present

## 2018-01-21 NOTE — Progress Notes (Signed)
GYNECOLOGY OFFICE FOLLOW UP NOTE  History:  52 y.o. G1P0101 here today for follow up for AUB. S/p [place] 08/2015. Did well after ablation but started bleeding again 12/2016 and restarted on Megace. Has had regular periods lasting 14 days for the last year but with heavy clots. In May and June 2019, she had two 14 day periods that started a week apart and has not had a period this month. Stopped taking Megace 80 mg BID earlier this year after weaning. She gets significant mood swings and hormonal changes every month with period and would prefer to not have a period at all.   Past Medical History:  Diagnosis Date  . ADHD (attention deficit hyperactivity disorder)   . Arthritis   . Asthma   . Cancer ([place])   . Chicken pox AGE 52  . Diabetes mellitus without complication ([place])   . GERD (gastroesophageal reflux disease)   . Glaucoma    BOTH EYES, NO EYE DROPS  . Glaucoma   . History of blood transfusion MARCH 2016    2UNITS GIVEN AND IRON GIVEN  . Hyperglycemia 09/09/2014  . Hypertension   . 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 Iron deficiency anemia due to chronic blood loss 09/09/2014  . Low TSH level 09/08/2014  . Lumbar herniated disc   . Menorrhagia 09/08/2014  . Migraine    CLUSTER AND MIGRAINES  . Multiple thyroid nodules   . Ovarian mass, right 09/09/2014  . PE (pulmonary embolism) 2016  . Peripheral neuropathy    FINGER TIPS AND TOES NUMB SOME  . Preeclampsia  1983   WITH PREGNANCY  . PTSD (post-traumatic stress disorder)   . Scoliosis   . Seizures ([place]) AGE 29   NONE SINCE, HAD CHICKEN POX THEN    Past Surgical History:  Procedure Laterality Date  . ANGIOGRAM TO LEG  08-13-15   RIGHT  . CHOLECYSTECTOMY    . IR GENERIC HISTORICAL  04/04/2016   IR RADIOLOGIST EVAL & MGMT 04/04/2016 [name], MD [facility] INTERV RAD  . THYROIDECTOMY N/A 11/17/2015   Procedure: TOTAL THYROIDECTOMY;  Surgeon: Armandina Gemma,  MD;  Location: [facility]S;  Service: General;  Laterality: N/A;  . UTERINE ARTERY EMBOLIZATION Bilateral 08/13/2015     Current Outpatient Medications:  .  ACCU-CHEK AVIVA PLUS test strip, USE TO TEST BLOOD GLUCOSE FOUR TIMES DAILY BEFORE MEALS AND AT BEDTIME, Disp: 100 each, Rfl: 12 .  ACCU-CHEK SOFTCLIX LANCETS lancets, , Disp: , Rfl: 3 .  albuterol (PROVENTIL) (2.5 MG/3ML) 0.083% nebulizer solution, TAKE 3 MLS (2.5 MG TOTAL) BY NEBULIZATION EVERY 6 HOURS AS NEEDED FORWHEEZING OR SHORTNESS OF BREATH, Disp: 150 mL, Rfl: 1 .  amitriptyline (ELAVIL) 50 MG tablet, Take 50 mg by mouth at bedtime., Disp: , Rfl:  .  atorvastatin (LIPITOR) 20 MG tablet, Take 1 tablet (20 mg total) by mouth daily., Disp: 90 tablet, Rfl: 1 .  buPROPion (WELLBUTRIN XL) 300 MG 24 hr tablet, Take 300 mg by mouth daily. , Disp: , Rfl: 2 .  busPIRone (BUSPAR) 5 MG tablet, Take 5 mg by mouth 3 (three) times daily., Disp: , Rfl:  .  citalopram (CELEXA) 40 MG tablet, , Disp: , Rfl: 2 .  ELIQUIS 5 MG TABS tablet, TAKE 1 TABLET BY MOUTH TWICE DAILY, Disp: 60 tablet, Rfl: 6 .  fluticasone (CUTIVATE) 0.05 % cream, Apply topically 2 (two) times daily., Disp:  30 g, Rfl: 2 .  fluticasone (FLONASE) 50 MCG/ACT nasal spray, PLACE 2 SPRAYS INTO BOTH NOSTRILS DAILY (Patient taking differently: Place 2 sprays into both nostrils daily as needed for allergies. ), Disp: 16 g, Rfl: 2 .  Fluticasone-Salmeterol (ADVAIR DISKUS) 250-50 MCG/DOSE AEPB, Inhale 1 puff into the lungs 2 (two) times daily., Disp: 60 each, Rfl: 7 .  gabapentin (NEURONTIN) 100 MG capsule, TAKE 1 CAPSULE BY MOUTH THREE TIMES A DAY, Disp: 90 capsule, Rfl: 2 .  hydrochlorothiazide (MICROZIDE) 12.5 MG capsule, TAKE 1 CAPSULE BY MOUTH ONCE DAILY, Disp: 30 capsule, Rfl: 2 .  insulin lispro (HUMALOG) 100 UNIT/ML injection, Inject 6 Units into the skin 3 (three) times daily with meals. 8 U at beginning of meal in V-Go 30, up to 100 units/day , Disp: , Rfl:  .  LANTUS SOLOSTAR 100  UNIT/ML Solostar Pen, Inject 24 Units into the skin daily at 10 pm. , Disp: , Rfl: 0 .  loratadine (CLARITIN) 10 MG tablet, Take 1 tablet (10 mg total) by mouth daily., Disp: 30 tablet, Rfl: 11 .  metFORMIN (GLUCOPHAGE) 850 MG tablet, Take 1 tablet (850 mg total) by mouth 2 (two) times daily with a meal., Disp: 180 tablet, Rfl: 3 .  methocarbamol (ROBAXIN) 500 MG tablet, TAKE 1 TABLET BY MOUTH EVERY 8 HOURS AS NEEDED FOR MUSCLE SPASMS, Disp: 40 tablet, Rfl: 2 .  metoprolol tartrate (LOPRESSOR) 50 MG tablet, TAKE 1 TABLET BY MOUTH TWICE DAILY, Disp: 60 tablet, Rfl: 2 .  montelukast (SINGULAIR) 10 MG tablet, TAKE 1 TABLET BY MOUTH EVERY NIGHT AT BEDTIME, Disp: 30 tablet, Rfl: 2 .  prazosin (MINIPRESS) 1 MG capsule, Take 1 mg by mouth at bedtime., Disp: , Rfl:  .  SYNTHROID 150 MCG tablet, , Disp: , Rfl: 6 .  topiramate (TOPAMAX) 100 MG tablet, Take 1 tablet (100 mg total) by mouth at bedtime., Disp: 30 tablet, Rfl: 3 .  traMADol (ULTRAM) 50 MG tablet, Take 1 tablet (50 mg total) by mouth every 8 (eight) hours as needed for moderate pain or severe pain., Disp: 60 tablet, Rfl: 0 .  UNIFINE PENTIPS 32G X 4 MM MISC, , Disp: , Rfl: 0 .  eletriptan (RELPAX) 40 MG tablet, Take 1 tablet (40 mg total) by mouth as needed for migraine or headache. May repeat in 2 hours if headache persists or recurs. (Patient not taking: Reported on 01/21/2018), Disp: 10 tablet, Rfl: 1  The following portions of the patient's history were reviewed and updated as appropriate: allergies, current medications, past family history, past medical history, past social history, past surgical history and problem list.   Review of Systems:  Pertinent items noted in HPI and remainder of comprehensive ROS otherwise negative.   Objective:  Physical Exam Ht 5\' 10"  (1.778 m)   Wt (!) 351 lb 3.2 oz (159.3 kg)   LMP 12/23/2017 (Exact Date)   BMI 50.39 kg/m  CONSTITUTIONAL: Well-developed, well-nourished female in no acute distress.    NEUROLOGIC: Alert and oriented to person, place, and time. Normal reflexes, muscle tone coordination. No cranial nerve deficit noted. PSYCHIATRIC: Normal mood and affect. Normal behavior. Normal judgment and thought content. CARDIOVASCULAR: Normal heart rate noted RESPIRATORY: Effort normal, no problems with respiration noted ABDOMEN: Soft, no distention noted.   PELVIC: deferred MUSCULOSKELETAL: Normal range of motion. No edema noted.  Labs and Imaging Mm 3d Screen Breast Bilateral  Result Date: 12/25/2017 CLINICAL DATA:  Screening. EXAM: DIGITAL SCREENING BILATERAL MAMMOGRAM WITH TOMO AND CAD COMPARISON:  Previous exam(s). ACR Breast Density Category a: The breast tissue is almost entirely fatty. FINDINGS: There are no findings suspicious for malignancy. Images were processed with CAD. IMPRESSION: No mammographic evidence of malignancy. A result letter of this screening mammogram will be mailed directly to the patient. RECOMMENDATION: Screening mammogram in one year. (Code:SM-B-01Y) BI-RADS CATEGORY  1: Negative. Electronically Signed   By: Claudie Revering M.D.   On: 12/25/2017 16:52    Assessment & Plan:  1. Abnormal uterine bleeding (AUB) - Patient with regular cycles now off of megace - She is unhappy with having periods at all, enjoyed not having them after [place], but given her extensive medical co-morbidities, she is not a candidate for surgery - may consider mirena IUD if bleeding worsens - reviewed recommendation for EMB if cycles become more irregular as she is at higher risk of uterine cancer - will return 6 months for follow up and likely do EMB at that point, she will return sooner if periods become irregular or heavier  Routine preventative health maintenance measures emphasized. Please refer to After Visit Summary for other counseling recommendations.   Return in about 6 months (around 07/24/2018).   Feliz Beam, M.D. [facility]

## 2018-01-21 NOTE — Progress Notes (Signed)
Pt. Scored high on PHQ, already seeing a therapist.

## 2018-01-22 ENCOUNTER — Encounter: Payer: Self-pay | Admitting: Obstetrics and Gynecology

## 2018-01-28 ENCOUNTER — Other Ambulatory Visit: Payer: Self-pay | Admitting: Internal Medicine

## 2018-01-28 DIAGNOSIS — I10 Essential (primary) hypertension: Secondary | ICD-10-CM

## 2018-01-28 DIAGNOSIS — Z7689 Persons encountering health services in other specified circumstances: Secondary | ICD-10-CM | POA: Diagnosis not present

## 2018-01-28 DIAGNOSIS — F411 Generalized anxiety disorder: Secondary | ICD-10-CM | POA: Diagnosis not present

## 2018-01-28 MED FILL — MONTELUKAST SOD 10 MG TAB: 10 | 30 days supply | Qty: 30 | Fill #1

## 2018-01-28 MED FILL — ATORVASTATIN CALCIUM 20 MG: 20 | 30 days supply | Qty: 30 | Fill #5

## 2018-01-28 MED FILL — METHOCARBAMOL 500 MG TABLET: 500 | 13 days supply | Qty: 40 | Fill #2

## 2018-01-28 MED FILL — HYDROCHLOROTHIAZIDE 12.5 MG: 12.5 | 30 days supply | Qty: 30 | Fill #0

## 2018-01-28 MED FILL — UNIFINE PENTIPS 32GX5/32: 32G X 4 MM | 25 days supply | Qty: 100 | Fill #0

## 2018-01-28 MED FILL — TOPIRAMATE 100 MG TABLET: 100 | 30 days supply | Qty: 30 | Fill #1

## 2018-01-28 MED FILL — ACCU-CHEK GUIDE TEST STRIP: 90 days supply | Qty: 200 | Fill #0

## 2018-01-28 MED FILL — UNIFINE PENTIPS 32GX5/32": 32G X 4 MM | 25 days supply | Qty: 100 | Fill #0

## 2018-01-28 MED FILL — CLEAR-ATADINE 10 MG TABLET: 10 | 30 days supply | Qty: 30 | Fill #3

## 2018-01-28 MED FILL — ELIQUIS 5 MG TABLET: 5 | 30 days supply | Qty: 60 | Fill #3

## 2018-01-28 MED FILL — PRAZOSIN 1 MG CAPSULE: 1 | 30 days supply | Qty: 30 | Fill #1

## 2018-01-28 MED FILL — METOPROLOL TARTRATE 50 MG T: 50 | 30 days supply | Qty: 60 | Fill #0

## 2018-01-28 MED FILL — ACCU-CHEK SOFTCLIX LANCETS: 25 days supply | Qty: 100 | Fill #2

## 2018-01-30 DIAGNOSIS — E1165 Type 2 diabetes mellitus with hyperglycemia: Secondary | ICD-10-CM | POA: Diagnosis not present

## 2018-02-02 ENCOUNTER — Encounter: Payer: Self-pay | Admitting: Internal Medicine

## 2018-02-02 ENCOUNTER — Ambulatory Visit: Payer: Medicaid Other | Attending: Internal Medicine | Admitting: Internal Medicine

## 2018-02-02 VITALS — BP 122/83 | HR 79 | Temp 98.1°F | Resp 16 | Wt 349.4 lb

## 2018-02-02 DIAGNOSIS — J4540 Moderate persistent asthma, uncomplicated: Secondary | ICD-10-CM

## 2018-02-02 DIAGNOSIS — Z8249 Family history of ischemic heart disease and other diseases of the circulatory system: Secondary | ICD-10-CM | POA: Diagnosis not present

## 2018-02-02 DIAGNOSIS — G43909 Migraine, unspecified, not intractable, without status migrainosus: Secondary | ICD-10-CM | POA: Diagnosis not present

## 2018-02-02 DIAGNOSIS — Z881 Allergy status to other antibiotic agents status: Secondary | ICD-10-CM | POA: Diagnosis not present

## 2018-02-02 DIAGNOSIS — E890 Postprocedural hypothyroidism: Secondary | ICD-10-CM

## 2018-02-02 DIAGNOSIS — Z833 Family history of diabetes mellitus: Secondary | ICD-10-CM | POA: Diagnosis not present

## 2018-02-02 DIAGNOSIS — E118 Type 2 diabetes mellitus with unspecified complications: Secondary | ICD-10-CM | POA: Diagnosis not present

## 2018-02-02 DIAGNOSIS — Z91013 Allergy to seafood: Secondary | ICD-10-CM | POA: Insufficient documentation

## 2018-02-02 DIAGNOSIS — Z9889 Other specified postprocedural states: Secondary | ICD-10-CM | POA: Diagnosis not present

## 2018-02-02 DIAGNOSIS — Z803 Family history of malignant neoplasm of breast: Secondary | ICD-10-CM | POA: Diagnosis not present

## 2018-02-02 DIAGNOSIS — Z79899 Other long term (current) drug therapy: Secondary | ICD-10-CM | POA: Diagnosis not present

## 2018-02-02 DIAGNOSIS — M545 Low back pain: Secondary | ICD-10-CM

## 2018-02-02 DIAGNOSIS — Z9049 Acquired absence of other specified parts of digestive tract: Secondary | ICD-10-CM | POA: Insufficient documentation

## 2018-02-02 DIAGNOSIS — I10 Essential (primary) hypertension: Secondary | ICD-10-CM | POA: Diagnosis not present

## 2018-02-02 DIAGNOSIS — F419 Anxiety disorder, unspecified: Secondary | ICD-10-CM

## 2018-02-02 DIAGNOSIS — G8929 Other chronic pain: Secondary | ICD-10-CM

## 2018-02-02 DIAGNOSIS — Z6841 Body Mass Index (BMI) 40.0 and over, adult: Secondary | ICD-10-CM | POA: Diagnosis not present

## 2018-02-02 DIAGNOSIS — Z87891 Personal history of nicotine dependence: Secondary | ICD-10-CM | POA: Diagnosis not present

## 2018-02-02 DIAGNOSIS — Z880 Allergy status to penicillin: Secondary | ICD-10-CM | POA: Insufficient documentation

## 2018-02-02 DIAGNOSIS — Z794 Long term (current) use of insulin: Secondary | ICD-10-CM

## 2018-02-02 DIAGNOSIS — F4310 Post-traumatic stress disorder, unspecified: Secondary | ICD-10-CM | POA: Diagnosis not present

## 2018-02-02 LAB — GLUCOSE, POCT (MANUAL RESULT ENTRY): POC GLUCOSE: 102 mg/dL — AB (ref 70–99)

## 2018-02-02 MED ORDER — TRAMADOL HCL 50 MG PO TABS: 50.0000 mg | ORAL_TABLET | Freq: Every day | ORAL | 1 refills | Status: DC | PRN

## 2018-02-02 MED ORDER — LEVOTHYROXINE SODIUM 175 MCG PO TABS: 175.0000 ug | ORAL_TABLET | Freq: Every day | ORAL | 0 refills | Status: DC

## 2018-02-02 MED FILL — traMADol HCL 50 MG TABS: 50 | 30 days supply | Qty: 30 | Fill #0

## 2018-02-02 NOTE — Progress Notes (Signed)
Patient ID: Melinda [name], female    DOB: June 11, 1966  MRN: [id number]  CC: Diabetes; Migraine; Hypertension; and Cough   Subjective: Melinda [name] is a 52 y.o. female who presents for chronic ds management Her concerns today include:  52 year old with history ofDM(followed by Dr. Jana Hakim, moderate persistent asthma,migraines followed by neurology, uterine fibroids followed by GYN, papillary thyroid CA s/pthyroidectomy, history of DVT/PE onEliquislifetime,obesity and left shoulder pain.  Migraine:  Having a migraine this a.m; started on her drive over here so she did not have time to take anything.  Followed by Dr. [name].  She is supposed to be on Relpax for abortive and Topamax for prophylaxis.  Out of Relpax at home.  However I see that her neurologist recently sent a prescription to her pharmacy with refills on the Relpax.  She will call today to have the prescription delivered.  Followed by [facility], Dr. Alinda Deem.  Buspar recently added.  Being treated for PTSD and anxiety.    DM: checks BS every morning.  A1C b/w 5-6 on last visit with Dr. Buddy Duty in May. Doing okay with eating habits Loss about 20 lbs since May.  "I was able to walk around the grocery store" rather than use the [place] chair for the first time in several months Reports compliance with Lantus and Humalog  Saw GYN since last visit.  Menses irregular since being off of Megace.  No Menses in July.  Plan is to follow-up with GYN if bleeding worsens to consider EMB and IUD placement.  HTN: compliant with HCTZ and Metoprolol and salt restriction.  Denies any chest pains or shortness of breath.  Thyroid:  Followed by Dr. Buddy Duty.  Levothyroxine Increased to 175 mcg from 150 mcg  Asthma: Continue to complain of cough sometimes productive of yellow phlegm.  No better with increse dose of advair on last visit.  She is also on Singulair, Claritin and Flonase nasal spray.  Denies any significant GERD  symptoms.  Requesting refill on tramadol which he takes as needed for back pain.  She takes it mainly at nights because the pain is worse at night times.  Referred for some physical therapy earlier this year which she found helpful.  She also has a TENS unit which she uses intermittently.  X-ray of the lumbar spine done in January of this year revealed degenerative changes Patient Active Problem List   Diagnosis Date Noted  . Left shoulder pain 10/14/2016  . Hypertension 10/14/2016  . S/P thyroidectomy 06/27/2016  . Chronic bilateral low back pain without sciatica 04/22/2016  . Liver lesion 12/24/2015  . Papillary thyroid carcinoma ([place]) 12/21/2015  . Controlled type 2 diabetes mellitus with complication, with long-term current use of insulin ([place]) 10/10/2015  . Right leg DVT ([place]) 10/10/2015  . Vision loss 08/25/2015  . Decreased vision in both eyes 08/25/2015  . Prolonged Q-T interval on ECG 08/19/2015  . History of pulmonary embolism 08/10/2015  . Moderate persistent asthma 04/13/2015  . Fibroid, uterine   . Morbid obesity ([place]) 11/11/2014  . Sinus tachycardia 11/08/2014  . Migraine 10/14/2014  . Menorrhagia 09/08/2014     Current Outpatient Medications on File Prior to Visit  Medication Sig Dispense Refill  . ACCU-CHEK AVIVA PLUS test strip USE TO TEST BLOOD GLUCOSE FOUR TIMES DAILY BEFORE MEALS AND AT BEDTIME 100 each 12  . ACCU-CHEK SOFTCLIX LANCETS lancets   3  . albuterol (PROVENTIL) (2.5 MG/3ML) 0.083% nebulizer solution TAKE 3 MLS (2.5 MG TOTAL) BY  NEBULIZATION EVERY 6 HOURS AS NEEDED FORWHEEZING OR SHORTNESS OF BREATH 150 mL 1  . amitriptyline (ELAVIL) 50 MG tablet Take 50 mg by mouth at bedtime.    Marland Kitchen atorvastatin (LIPITOR) 20 MG tablet Take 1 tablet (20 mg total) by mouth daily. 90 tablet 1  . buPROPion (WELLBUTRIN XL) 300 MG 24 hr tablet Take 300 mg by mouth daily.   2  . busPIRone (BUSPAR) 5 MG tablet Take 5 mg by mouth 3 (three) times daily.    . citalopram (CELEXA)  40 MG tablet   2  . eletriptan (RELPAX) 40 MG tablet Take 1 tablet (40 mg total) by mouth as needed for migraine or headache. May repeat in 2 hours if headache persists or recurs. (Patient not taking: Reported on 01/21/2018) 10 tablet 1  . ELIQUIS 5 MG TABS tablet TAKE 1 TABLET BY MOUTH TWICE DAILY 60 tablet 6  . fluticasone (CUTIVATE) 0.05 % cream Apply topically 2 (two) times daily. 30 g 2  . fluticasone (FLONASE) 50 MCG/ACT nasal spray PLACE 2 SPRAYS INTO BOTH NOSTRILS DAILY (Patient taking differently: Place 2 sprays into both nostrils daily as needed for allergies. ) 16 g 2  . Fluticasone-Salmeterol (ADVAIR DISKUS) 250-50 MCG/DOSE AEPB Inhale 1 puff into the lungs 2 (two) times daily. 60 each 7  . gabapentin (NEURONTIN) 100 MG capsule TAKE 1 CAPSULE BY MOUTH THREE TIMES A DAY 90 capsule 2  . hydrochlorothiazide (MICROZIDE) 12.5 MG capsule TAKE 1 CAPSULE BY MOUTH ONCE DAILY 30 capsule 0  . insulin lispro (HUMALOG) 100 UNIT/ML injection Inject 6 Units into the skin 3 (three) times daily with meals. 8 U at beginning of meal in V-Go 30, up to 100 units/day     . LANTUS SOLOSTAR 100 UNIT/ML Solostar Pen Inject 24 Units into the skin daily at 10 pm.   0  . loratadine (CLARITIN) 10 MG tablet Take 1 tablet (10 mg total) by mouth daily. 30 tablet 11  . metFORMIN (GLUCOPHAGE) 850 MG tablet Take 1 tablet (850 mg total) by mouth 2 (two) times daily with a meal. 180 tablet 3  . methocarbamol (ROBAXIN) 500 MG tablet TAKE 1 TABLET BY MOUTH EVERY 8 HOURS AS NEEDED FOR MUSCLE SPASMS 40 tablet 2  . metoprolol tartrate (LOPRESSOR) 50 MG tablet TAKE 1 TABLET BY MOUTH TWICE DAILY 60 tablet 2  . montelukast (SINGULAIR) 10 MG tablet TAKE 1 TABLET BY MOUTH EVERY NIGHT AT BEDTIME 30 tablet 2  . prazosin (MINIPRESS) 1 MG capsule Take 1 mg by mouth at bedtime.    . topiramate (TOPAMAX) 100 MG tablet Take 1 tablet (100 mg total) by mouth at bedtime. 30 tablet 3  . UNIFINE PENTIPS 32G X 4 MM MISC   0   No current  facility-administered medications on file prior to visit.     Allergies  Allergen Reactions  . Ceftriaxone Anaphylaxis    ROCEPHIN  . Penicillins Shortness Of Breath    Has patient had a PCN reaction causing immediate rash, facial/tongue/throat swelling, SOB or lightheadedness with hypotension: Yes Has patient had a PCN reaction causing severe rash involving mucus membranes or skin necrosis: No Has patient had a PCN reaction that required hospitalization No Has patient had a PCN reaction occurring within the last 10 years: No If all of the above answers are "NO", then may proceed with Cephalosporin use.   . Shellfish Allergy Anaphylaxis  . Gold-Containing Drug Products     HANDS ITCH  . Nickel  HANDS SWELL  . Citrus Rash    Social History   Socioeconomic History  . Marital status: Single    Spouse name: Not on file  . Number of children: Not on file  . Years of education: Not on file  . Highest education level: Not on file  Occupational History  . Not on file  Social Needs  . Financial resource strain: Not on file  . Food insecurity:    Worry: Not on file    Inability: Not on file  . Transportation needs:    Medical: Not on file    Non-medical: Not on file  Tobacco Use  . Smoking status: Former Smoker    Packs/day: 0.50    Years: 40.00    Pack years: 20.00    Types: Cigarettes    Last attempt to quit: 11/03/2014    Years since quitting: 3.2  . Smokeless tobacco: Never Used  Substance and Sexual Activity  . Alcohol use: No    Alcohol/week: 0.0 oz  . Drug use: No  . Sexual activity: Never    Birth control/protection: None  Lifestyle  . Physical activity:    Days per week: Not on file    Minutes per session: Not on file  . Stress: Not on file  Relationships  . Social connections:    Talks on phone: Not on file    Gets together: Not on file    Attends religious service: Not on file    Active member of club or organization: Not on file    Attends meetings  of clubs or organizations: Not on file    Relationship status: Not on file  . Intimate partner violence:    Fear of current or ex partner: Not on file    Emotionally abused: Not on file    Physically abused: Not on file    Forced sexual activity: Not on file  Other Topics Concern  . Not on file  Social History Narrative  . Not on file    Family History  Problem Relation Age of Onset  . Diabetes Mother   . Breast cancer Mother   . CAD Mother   . Hypertension Mother   . Alcohol abuse Father   . Hypertension Father   . Breast cancer Maternal Aunt   . Breast cancer Maternal Aunt     Past Surgical History:  Procedure Laterality Date  . ANGIOGRAM TO LEG  08-13-15   RIGHT  . CHOLECYSTECTOMY    . IR GENERIC HISTORICAL  04/04/2016   IR RADIOLOGIST EVAL & MGMT 04/04/2016 [name], MD [facility] INTERV RAD  . THYROIDECTOMY N/A 11/17/2015   Procedure: TOTAL THYROIDECTOMY;  Surgeon: Armandina Gemma, MD;  Location: [facility];  Service: General;  Laterality: N/A;  . UTERINE ARTERY EMBOLIZATION Bilateral 08/13/2015    ROS: Review of Systems Negative except as stated above. PHYSICAL EXAM: BP 122/83   Pulse 79   Temp 98.1 F (36.7 C) (Oral)   Resp 16   Wt (!) 349 lb 6.4 oz (158.5 kg)   SpO2 96%   BMI 50.13 kg/m   Wt Readings from Last 3 Encounters:  02/02/18 (!) 349 lb 6.4 oz (158.5 kg)  01/21/18 (!) 351 lb 3.2 oz (159.3 kg)  12/12/17 (!) 367 lb (166.5 kg)   Physical Exam  General appearance - alert, well appearing, and in no distress.  Patient was sitting in the exam room with the light off wearing dark shades. Mental status - normal mood, behavior,  speech, dress, motor activity, and thought processes Neck - supple, no significant adenopathy Chest - clear to auscultation, no wheezes, rales or rhonchi, symmetric air entry Heart - normal rate, regular rhythm, normal S1, S2, no murmurs, rubs, clicks or gallops Musculoskeletal -mild gait disturbance due to mild deformity of the right leg  and foot.  The right foot is turning inward and the leg is small compared to the left. Extremities - peripheral pulses normal, no pedal edema, no clubbing or cyanosis   Results for orders placed or performed in visit on 02/02/18  POCT glucose (manual entry)  Result Value Ref Range   POC Glucose 102 (A) 70 - 99 mg/dl    ASSESSMENT AND PLAN: 1. Controlled type 2 diabetes mellitus with complication, with long-term current use of insulin (HCC) - POCT glucose (manual entry) Continue current medications and follow-up with endocrinology. Continue to encourage healthy eating habits.  2. Migraine without status migrainosus, not intractable, unspecified migraine type -Patient will pick up Relpax prescription.  Advised best to take abortive therapy as soon as she feels a migraine coming on  3. Moderate persistent asthma without complication -Patient with persistent cough.  Continue Advair and Singulair.  Referral placed to pulmonary.  4. Postoperative hypothyroidism Followed by endocrinology.  5. Anxiety 6. PTSD (post-traumatic stress disorder) -She has a behavioral health provider in the community  7. Chronic bilateral low back pain without sciatica -Controlled substance prescribing agreement reviewed and patient signed.  We discussed that tramadol is a controlled substance and as such there is always risk of dependence or abuse.  Advised to take the medication only as needed. - traMADol (ULTRAM) 50 MG tablet; Take 1 tablet (50 mg total) by mouth daily as needed for moderate pain or severe pain.  8. Class 3 severe obesity due to excess calories with serious comorbidity and body mass index (BMI) of 50.0 to 59.9 in adult [facility]) Dietary counseling given.  Commended her on weight loss so far.  Increase physical activity as tolerated.    Patient was given the opportunity to ask questions.  Patient verbalized understanding of the plan and was able to repeat key elements of the plan.   Orders  Placed This Encounter  Procedures  . POCT glucose (manual entry)     Requested Prescriptions   Signed Prescriptions Disp Refills  . levothyroxine (SYNTHROID, LEVOTHROID) 175 MCG tablet 30 tablet 0    Sig: Take 1 tablet (175 mcg total) by mouth daily.  . traMADol (ULTRAM) 50 MG tablet 30 tablet 1    Sig: Take 1 tablet (50 mg total) by mouth daily as needed for moderate pain or severe pain.    Return in about 3 months (around 05/05/2018).  [name], MD, FACP

## 2018-02-04 DIAGNOSIS — Z7689 Persons encountering health services in other specified circumstances: Secondary | ICD-10-CM | POA: Diagnosis not present

## 2018-02-04 DIAGNOSIS — F411 Generalized anxiety disorder: Secondary | ICD-10-CM | POA: Diagnosis not present

## 2018-02-11 DIAGNOSIS — F411 Generalized anxiety disorder: Secondary | ICD-10-CM | POA: Diagnosis not present

## 2018-02-18 ENCOUNTER — Other Ambulatory Visit: Payer: Self-pay

## 2018-02-18 ENCOUNTER — Emergency Department (HOSPITAL_COMMUNITY): Payer: Medicaid Other

## 2018-02-18 ENCOUNTER — Encounter (HOSPITAL_COMMUNITY): Payer: Self-pay | Admitting: Emergency Medicine

## 2018-02-18 ENCOUNTER — Emergency Department (HOSPITAL_COMMUNITY)
Admission: EM | Admit: 2018-02-18 | Discharge: 2018-02-19 | Disposition: A | Discharge status: Home / Self Care | Payer: Medicaid Other | Attending: Emergency Medicine | Admitting: Emergency Medicine

## 2018-02-18 DIAGNOSIS — G4489 Other headache syndrome: Secondary | ICD-10-CM | POA: Diagnosis not present

## 2018-02-18 DIAGNOSIS — S300XXA Contusion of lower back and pelvis, initial encounter: Secondary | ICD-10-CM | POA: Diagnosis not present

## 2018-02-18 DIAGNOSIS — Y998 Other external cause status: Secondary | ICD-10-CM | POA: Diagnosis not present

## 2018-02-18 DIAGNOSIS — W19XXXA Unspecified fall, initial encounter: Secondary | ICD-10-CM | POA: Insufficient documentation

## 2018-02-18 DIAGNOSIS — Z794 Long term (current) use of insulin: Secondary | ICD-10-CM | POA: Diagnosis not present

## 2018-02-18 DIAGNOSIS — I10 Essential (primary) hypertension: Secondary | ICD-10-CM | POA: Insufficient documentation

## 2018-02-18 DIAGNOSIS — Z87891 Personal history of nicotine dependence: Secondary | ICD-10-CM | POA: Diagnosis not present

## 2018-02-18 DIAGNOSIS — Z86718 Personal history of other venous thrombosis and embolism: Secondary | ICD-10-CM | POA: Insufficient documentation

## 2018-02-18 DIAGNOSIS — Z79899 Other long term (current) drug therapy: Secondary | ICD-10-CM | POA: Insufficient documentation

## 2018-02-18 DIAGNOSIS — Y939 Activity, unspecified: Secondary | ICD-10-CM | POA: Diagnosis not present

## 2018-02-18 DIAGNOSIS — E119 Type 2 diabetes mellitus without complications: Secondary | ICD-10-CM | POA: Diagnosis not present

## 2018-02-18 DIAGNOSIS — Y929 Unspecified place or not applicable: Secondary | ICD-10-CM | POA: Insufficient documentation

## 2018-02-18 DIAGNOSIS — J45909 Unspecified asthma, uncomplicated: Secondary | ICD-10-CM | POA: Insufficient documentation

## 2018-02-18 DIAGNOSIS — R110 Nausea: Secondary | ICD-10-CM | POA: Diagnosis not present

## 2018-02-18 DIAGNOSIS — F411 Generalized anxiety disorder: Secondary | ICD-10-CM | POA: Diagnosis not present

## 2018-02-18 DIAGNOSIS — G43409 Hemiplegic migraine, not intractable, without status migrainosus: Secondary | ICD-10-CM | POA: Insufficient documentation

## 2018-02-18 DIAGNOSIS — Z7689 Persons encountering health services in other specified circumstances: Secondary | ICD-10-CM | POA: Diagnosis not present

## 2018-02-18 DIAGNOSIS — S3992XA Unspecified injury of lower back, initial encounter: Secondary | ICD-10-CM | POA: Diagnosis not present

## 2018-02-18 MED ORDER — SODIUM CHLORIDE 0.9 % IV BOLUS
1000.0000 mL | Freq: Once | INTRAVENOUS | Status: AC
  Administered 2018-02-19: 1000 mL via INTRAVENOUS

## 2018-02-18 MED ORDER — KETOROLAC TROMETHAMINE 15 MG/ML IJ SOLN
15.0000 mg | Freq: Once | INTRAMUSCULAR | Status: AC
Start: 2018-02-18 — End: 2018-02-19
  Administered 2018-02-19: 15 mg via INTRAVENOUS
  Filled 2018-02-18: qty 1

## 2018-02-18 MED ORDER — LORAZEPAM 2 MG/ML IJ SOLN
1.0000 mg | Freq: Once | INTRAMUSCULAR | Status: AC
Start: 2018-02-18 — End: 2018-02-19
  Administered 2018-02-19: 1 mg via INTRAVENOUS
  Filled 2018-02-18: qty 1

## 2018-02-18 NOTE — ED Provider Notes (Signed)
[facility] DEPT Provider Note: Georgena Spurling, MD, FACEP  CSN: [id number] MRN: [id number] ARRIVAL: 02/18/18 at [place]: [place]  Headache; Nausea; and Back Pain   HISTORY OF PRESENT ILLNESS  02/18/18 11:12 PM Melinda [name] is a 52 y.o. female with a history of chronic back pain which was exacerbated about a week ago after falling and landing on her sacral region.  Her pain is moderate to severe and worse with ambulation.  She is also having a headache which she describes as a migraine.  There is some associated nausea and vomiting.  She denies photophobia.  She is also having lower abdominal pain which began today as well.  She describes this is "feeling like everything is falling out".  She is currently on her menses but does not associate the abdominal pain with her menstrual cycle.   Past Medical History:  Diagnosis Date  . ADHD (attention deficit hyperactivity disorder)   . Arthritis   . Asthma   . Cancer ([place])   . Chicken pox AGE 89  . Diabetes mellitus without complication ([place])   . GERD (gastroesophageal reflux disease)   . Glaucoma    BOTH EYES, NO EYE DROPS  . Glaucoma   . History of blood transfusion MARCH 2016    2UNITS GIVEN AND IRON GIVEN  . Hyperglycemia 09/09/2014  . Hypertension   . 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 Iron deficiency anemia due to chronic blood loss 09/09/2014  . Low TSH level 09/08/2014  . Lumbar herniated disc   . Menorrhagia 09/08/2014  . Migraine    CLUSTER AND MIGRAINES  . Multiple thyroid nodules   . Ovarian mass, right 09/09/2014  . PE (pulmonary embolism) 2016  . Peripheral neuropathy    FINGER TIPS AND TOES NUMB SOME  . Preeclampsia  1983   WITH PREGNANCY  . PTSD (post-traumatic stress disorder)   . Scoliosis   . Seizures ([place]) AGE 40   NONE SINCE, HAD CHICKEN POX THEN    Past Surgical History:  Procedure Laterality Date  .  ANGIOGRAM TO LEG  08-13-15   RIGHT  . CHOLECYSTECTOMY    . IR GENERIC HISTORICAL  04/04/2016   IR RADIOLOGIST EVAL & MGMT 04/04/2016 [name], MD [facility] INTERV RAD  . THYROIDECTOMY N/A 11/17/2015   Procedure: TOTAL THYROIDECTOMY;  Surgeon: Armandina Gemma, MD;  Location: [facility];  Service: General;  Laterality: N/A;  . UTERINE ARTERY EMBOLIZATION Bilateral 08/13/2015    Family History  Problem Relation Age of Onset  . Diabetes Mother   . Breast cancer Mother   . CAD Mother   . Hypertension Mother   . Alcohol abuse Father   . Hypertension Father   . Breast cancer Maternal Aunt   . Breast cancer Maternal Aunt     Social History   Tobacco Use  . Smoking status: Former Smoker    Packs/day: 0.50    Years: 40.00    Pack years: 20.00    Types: Cigarettes    Last attempt to quit: 11/03/2014    Years since quitting: 3.2  . Smokeless tobacco: Never Used  Substance Use Topics  . Alcohol use: No    Alcohol/week: 0.0 standard drinks  . Drug use: No    Prior to Admission medications   Medication Sig Start Date End Date Taking? Authorizing Provider  albuterol (PROVENTIL) (2.5 MG/3ML) 0.083% nebulizer  solution TAKE 3 MLS (2.5 MG TOTAL) BY NEBULIZATION EVERY 6 HOURS AS NEEDED FORWHEEZING OR SHORTNESS OF BREATH Patient taking differently: Take 2.5 mg by nebulization every 6 (six) hours as needed for wheezing or shortness of breath.  05/13/17  Yes [name], MD  amitriptyline (ELAVIL) 50 MG tablet Take 50 mg by mouth at bedtime.   Yes [provider]  atorvastatin (LIPITOR) 20 MG tablet Take 1 tablet (20 mg total) by mouth daily. 08/08/17  Yes [name], MD  buPROPion (WELLBUTRIN XL) 300 MG 24 hr tablet Take 300 mg by mouth daily.  09/12/17  Yes [provider]  busPIRone (BUSPAR) 5 MG tablet Take 5 mg by mouth 3 (three) times daily.   Yes [provider]  citalopram (CELEXA) 40 MG tablet Take 40 mg by mouth daily.  12/01/17  Yes [provider]    ELIQUIS 5 MG TABS tablet TAKE 1 TABLET BY MOUTH TWICE DAILY 11/06/17  Yes [name], MD  fluticasone (CUTIVATE) 0.05 % cream Apply topically 2 (two) times daily. 05/13/16  Yes Funches, Josalyn, MD  fluticasone (FLONASE) 50 MCG/ACT nasal spray PLACE 2 SPRAYS INTO BOTH NOSTRILS DAILY Patient taking differently: Place 2 sprays into both nostrils 2 (two) times daily.  05/13/17  Yes [name], MD  Fluticasone-Salmeterol (ADVAIR DISKUS) 250-50 MCG/DOSE AEPB Inhale 1 puff into the lungs 2 (two) times daily. 10/31/17  Yes [name], MD  gabapentin (NEURONTIN) 100 MG capsule TAKE 1 CAPSULE BY MOUTH THREE TIMES A DAY Patient taking differently: Take 100 mg by mouth 3 (three) times daily.  06/30/17  Yes [name], MD  HUMALOG KWIKPEN 100 UNIT/ML KiwkPen Inject 8 Units into the skin 3 (three) times daily.  12/25/17  Yes [provider]  hydrochlorothiazide (MICROZIDE) 12.5 MG capsule TAKE 1 CAPSULE BY MOUTH ONCE DAILY 01/28/18  Yes [name], MD  LANTUS SOLOSTAR 100 UNIT/ML Solostar Pen Inject 24 Units into the skin daily at 10 pm.  09/02/17  Yes [provider]  levothyroxine (SYNTHROID, LEVOTHROID) 175 MCG tablet Take 1 tablet (175 mcg total) by mouth daily. 02/02/18  Yes [name], MD  loratadine (CLARITIN) 10 MG tablet Take 1 tablet (10 mg total) by mouth daily. 10/31/17  Yes [name], MD  metFORMIN (GLUCOPHAGE) 850 MG tablet Take 1 tablet (850 mg total) by mouth 2 (two) times daily with a meal. 08/08/17  Yes [name], MD  methocarbamol (ROBAXIN) 500 MG tablet TAKE 1 TABLET BY MOUTH EVERY 8 HOURS AS NEEDED FOR MUSCLE SPASMS Patient taking differently: Take 500 mg by mouth 2 (two) times daily. TAKE 1 TABLET BY MOUTH EVERY 8 HOURS AS NEEDED FOR MUSCLE SPASMS 11/28/17  Yes [name], MD  metoprolol tartrate (LOPRESSOR) 50 MG tablet TAKE 1 TABLET BY MOUTH TWICE DAILY 10/09/17  Yes [name], MD  montelukast  (SINGULAIR) 10 MG tablet TAKE 1 TABLET BY MOUTH EVERY NIGHT AT BEDTIME 12/25/17  Yes [name], MD  prazosin (MINIPRESS) 1 MG capsule Take 1 mg by mouth at bedtime.   Yes [provider]  topiramate (TOPAMAX) 100 MG tablet Take 1 tablet (100 mg total) by mouth at bedtime. 12/12/17  Yes Jaffe, Adam R, DO  traMADol (ULTRAM) 50 MG tablet Take 1 tablet (50 mg total) by mouth daily as needed for moderate pain or severe pain. 02/02/18  Yes [name], MD  ACCU-CHEK AVIVA PLUS test strip USE TO TEST  BLOOD GLUCOSE FOUR TIMES DAILY BEFORE MEALS AND AT BEDTIME 11/06/15   Boykin Nearing, MD  ACCU-CHEK SOFTCLIX LANCETS lancets  10/20/17   [provider]  eletriptan (RELPAX) 40 MG tablet Take 1 tablet (40 mg total) by mouth as needed for migraine or headache. May repeat in 2 hours if headache persists or recurs. 01/09/18   [name], DO  UNIFINE PENTIPS 32G X 4 MM MISC  10/20/17   [provider]    Allergies Ceftriaxone; Penicillins; Shellfish allergy; Gold-containing drug products; Nickel; and Citrus   REVIEW OF SYSTEMS  Negative except as noted here or in the History of Present Illness.   PHYSICAL EXAMINATION  Initial Vital Signs Blood pressure (!) 140/97, pulse 84, temperature 98.7 F (37.1 C), temperature source Oral, resp. rate 16, SpO2 90 %.  Examination General: Well-developed, obese female in no acute distress; appearance consistent with age of record HENT: normocephalic; atraumatic; edentulous Eyes: pupils equal, round and reactive to light; extraocular muscles intact Neck: supple Heart: regular rate and rhythm Lungs: clear to auscultation bilaterally Abdomen: soft; obese; lower abdominal tenderness; bowel sounds present Back: Sacral tenderness Extremities: No deformity; full range of motion; pulses normal Neurologic: Awake, alert and oriented; motor function intact in all extremities and symmetric; no facial droop Skin: Warm and  dry Psychiatric: Normal mood and affect   RESULTS  Summary of this visit's results, reviewed by myself:   EKG Interpretation  Date/Time:    Ventricular Rate:    PR Interval:    QRS Duration:   QT Interval:    QTC Calculation:   R Axis:     Text Interpretation:        Laboratory Studies: Results for orders placed or performed during the hospital encounter of 02/18/18 (from the past 24 hour(s))  CBC with Differential/Platelet     Status: Abnormal   Collection Time: 02/18/18 11:37 PM  Result Value Ref Range   WBC 6.9 4.0 - 10.5 K/uL   RBC 5.85 (H) 3.87 - 5.11 MIL/uL   Hemoglobin 17.1 (H) 12.0 - 15.0 g/dL   HCT 52.1 (H) 36.0 - 46.0 %   MCV 89.1 78.0 - 100.0 fL   MCH 29.2 26.0 - 34.0 pg   MCHC 32.8 30.0 - 36.0 g/dL   RDW 14.5 11.5 - 15.5 %   Platelets 289 150 - 400 K/uL   Neutrophils Relative % 61 %   Neutro Abs 4.3 1.7 - 7.7 K/uL   Lymphocytes Relative 28 %   Lymphs Abs 1.9 0.7 - 4.0 K/uL   Monocytes Relative 9 %   Monocytes Absolute 0.6 0.1 - 1.0 K/uL   Eosinophils Relative 2 %   Eosinophils Absolute 0.1 0.0 - 0.7 K/uL   Basophils Relative 0 %   Basophils Absolute 0.0 0.0 - 0.1 K/uL  Basic metabolic panel     Status: Abnormal   Collection Time: 02/18/18 11:37 PM  Result Value Ref Range   Sodium 140 135 - 145 mmol/L   Potassium 3.4 (L) 3.5 - 5.1 mmol/L   Chloride 106 98 - 111 mmol/L   CO2 24 22 - 32 mmol/L   Glucose, Bld 91 70 - 99 mg/dL   BUN 12 6 - 20 mg/dL   Creatinine, Ser 0.93 0.44 - 1.00 mg/dL   Calcium 9.0 8.9 - 10.3 mg/dL   GFR calc non Af Amer >60 >60 mL/min   GFR calc Af Amer >60 >60 mL/min   Anion gap 10 5 - 15  I-Stat Beta  hCG blood, ED ([facility], [facility], [facility] only)     Status: None   Collection Time: 02/19/18 12:56 AM  Result Value Ref Range   I-stat hCG, quantitative <5.0 <5 mIU/mL   Comment 3           Imaging Studies: Dg Sacrum/coccyx  Result Date: 02/19/2018 CLINICAL DATA:  Fall from standing 3 days ago, sacrococcygeal pain. EXAM: SACRUM AND COCCYX  - 2+ VIEW COMPARISON:  Lumbar spine radiographs 07/11/2017 FINDINGS: Cortical margins of the sacrum and coccyx are intact, and unchanged from prior lumbar spine imaging. No acute fracture. Sacral ala are grossly maintained. Sacroiliac joints are congruent. Degenerative disc disease at L5-S1 incidentally noted. IMPRESSION: Negative sacrococcygeal radiographs. Electronically Signed   By: Jeb Levering M.D.   On: 02/19/2018 00:43    ED COURSE and MDM  Nursing notes and initial vitals signs, including pulse oximetry, reviewed.  Vitals:   02/18/18 1737 02/18/18 2231 02/19/18 0052 02/19/18 0251  BP: 126/90 (!) 140/97 128/90 133/77  Pulse: 87 84 80 89  Resp: 18 16 18 16   Temp: 98.7 F (37.1 C)     TempSrc: Oral     SpO2: 96% 90% 100% 98%   4:14 AM Headache improved after IV fluids and medications.  Radiographs showed no evidence of sacrococcygeal fracture.  PROCEDURES    ED DIAGNOSES     ICD-10-CM   1. Sporadic migraine G43.409   2. Contusion of sacrum, initial encounter S30.Margaretmary Lombard, MD 02/19/18 [phone]

## 2018-02-18 NOTE — ED Triage Notes (Signed)
Patient here with c/o headache, nausea, and lower back pain that started around 2pm. -Vomiting, -Diarrhea. Hx migraines, diabetes, migraines. Patient states pain feels different then migraine.   BP: 170/86 HR:84 RR: 16 CBG: 84

## 2018-02-19 ENCOUNTER — Emergency Department (HOSPITAL_COMMUNITY): Payer: Medicaid Other

## 2018-02-19 DIAGNOSIS — S3992XA Unspecified injury of lower back, initial encounter: Secondary | ICD-10-CM | POA: Diagnosis not present

## 2018-02-19 LAB — CBC WITH DIFFERENTIAL/PLATELET
Basophils Absolute: 0 10*3/uL (ref 0.0–0.1)
Basophils Relative: 0 %
EOS ABS: 0.1 10*3/uL (ref 0.0–0.7)
Eosinophils Relative: 2 %
HEMATOCRIT: 52.1 % — AB (ref 36.0–46.0)
HEMOGLOBIN: 17.1 g/dL — AB (ref 12.0–15.0)
LYMPHS ABS: 1.9 10*3/uL (ref 0.7–4.0)
LYMPHS PCT: 28 %
MCH: 29.2 pg (ref 26.0–34.0)
MCHC: 32.8 g/dL (ref 30.0–36.0)
MCV: 89.1 fL (ref 78.0–100.0)
Monocytes Absolute: 0.6 10*3/uL (ref 0.1–1.0)
Monocytes Relative: 9 %
NEUTROS ABS: 4.3 10*3/uL (ref 1.7–7.7)
NEUTROS PCT: 61 %
Platelets: 289 10*3/uL (ref 150–400)
RBC: 5.85 MIL/uL — AB (ref 3.87–5.11)
RDW: 14.5 % (ref 11.5–15.5)
WBC: 6.9 10*3/uL (ref 4.0–10.5)

## 2018-02-19 LAB — I-STAT BETA HCG BLOOD, ED (MC, WL, AP ONLY)

## 2018-02-19 LAB — BASIC METABOLIC PANEL
Anion gap: 10 (ref 5–15)
BUN: 12 mg/dL (ref 6–20)
CHLORIDE: 106 mmol/L (ref 98–111)
CO2: 24 mmol/L (ref 22–32)
Calcium: 9 mg/dL (ref 8.9–10.3)
Creatinine, Ser: 0.93 mg/dL (ref 0.44–1.00)
GFR calc Af Amer: >60 mL/min (ref >60)
GFR calc non Af Amer: >60 mL/min (ref >60)
Glucose, Bld: 91 mg/dL (ref 70–99)
POTASSIUM: 3.4 mmol/L — AB (ref 3.5–5.1)
SODIUM: 140 mmol/L (ref 135–145)

## 2018-02-19 NOTE — ED Notes (Signed)
Discharge instructions reviewed with pt. Pt verbalized understanding. PIV removed. Pt ambulatory to waiting room with cane.

## 2018-02-19 NOTE — ED Notes (Signed)
Patient does not feel that she can give a urine sample right now and stated "I'm on my period and I know it will make a mess".

## 2018-02-25 ENCOUNTER — Other Ambulatory Visit: Payer: Self-pay | Admitting: Internal Medicine

## 2018-02-25 DIAGNOSIS — Z7689 Persons encountering health services in other specified circumstances: Secondary | ICD-10-CM | POA: Diagnosis not present

## 2018-02-25 DIAGNOSIS — F411 Generalized anxiety disorder: Secondary | ICD-10-CM | POA: Diagnosis not present

## 2018-02-25 DIAGNOSIS — G8929 Other chronic pain: Secondary | ICD-10-CM

## 2018-02-25 DIAGNOSIS — M545 Low back pain: Principal | ICD-10-CM

## 2018-02-25 DIAGNOSIS — I10 Essential (primary) hypertension: Secondary | ICD-10-CM

## 2018-02-25 MED FILL — HYDROCHLOROTHIAZIDE 12.5 MG: 12.5 | 30 days supply | Qty: 30 | Fill #0

## 2018-02-25 MED FILL — CLEAR-ATADINE 10 MG TABLET: 10 | 30 days supply | Qty: 30 | Fill #4

## 2018-02-25 MED FILL — METHOCARBAMOL 500 MG TABLET: 500 | 13 days supply | Qty: 40 | Fill #0

## 2018-02-25 MED FILL — METOPROLOL TARTRATE 50 MG T: 50 | 30 days supply | Qty: 60 | Fill #1

## 2018-02-25 MED FILL — ATORVASTATIN CALCIUM 20 MG: 20 | 30 days supply | Qty: 30 | Fill #0

## 2018-02-25 MED FILL — TOPIRAMATE 100 MG TABLET: 100 | 30 days supply | Qty: 30 | Fill #2

## 2018-02-25 MED FILL — buPROPion HCL ER (XL) 300 M: 300 | 30 days supply | Qty: 30 | Fill #1

## 2018-02-25 MED FILL — metFORMIN HCL 850 MG TABS: 850 | 90 days supply | Qty: 180 | Fill #2

## 2018-02-25 MED FILL — PRAZOSIN 1 MG CAPSULE: 1 | 30 days supply | Qty: 30 | Fill #2

## 2018-02-25 MED FILL — AMITRIPTYLINE HCL 50 MG TAB: 50 | 30 days supply | Qty: 30 | Fill #1

## 2018-02-26 MED FILL — GABAPENTIN 100 MG CAPSULE: 100 | 30 days supply | Qty: 90 | Fill #0

## 2018-03-04 DIAGNOSIS — E1165 Type 2 diabetes mellitus with hyperglycemia: Secondary | ICD-10-CM | POA: Diagnosis not present

## 2018-03-04 DIAGNOSIS — Z7689 Persons encountering health services in other specified circumstances: Secondary | ICD-10-CM | POA: Diagnosis not present

## 2018-03-04 DIAGNOSIS — F411 Generalized anxiety disorder: Secondary | ICD-10-CM | POA: Diagnosis not present

## 2018-03-04 MED FILL — MONTELUKAST SOD 10 MG TAB: 10 | 30 days supply | Qty: 30 | Fill #2

## 2018-03-04 MED FILL — ELIQUIS 5 MG TABLET: 5 | 30 days supply | Qty: 60 | Fill #4

## 2018-03-04 MED FILL — CITALOPRAM HBR 40 MG TABLET: 40 | 30 days supply | Qty: 30 | Fill #1

## 2018-03-09 MED FILL — SYNTHROID 175 MCG TABLET: 175 | 30 days supply | Qty: 30 | Fill #1

## 2018-03-11 DIAGNOSIS — F411 Generalized anxiety disorder: Secondary | ICD-10-CM | POA: Diagnosis not present

## 2018-03-12 ENCOUNTER — Encounter: Payer: Self-pay | Admitting: Emergency Medicine

## 2018-03-12 ENCOUNTER — Ambulatory Visit (INDEPENDENT_AMBULATORY_CARE_PROVIDER_SITE_OTHER): Payer: Medicaid Other | Admitting: Emergency Medicine

## 2018-03-12 DIAGNOSIS — R05 Cough: Secondary | ICD-10-CM | POA: Insufficient documentation

## 2018-03-12 DIAGNOSIS — R053 Chronic cough: Secondary | ICD-10-CM

## 2018-03-12 DIAGNOSIS — J4540 Moderate persistent asthma, uncomplicated: Secondary | ICD-10-CM

## 2018-03-12 MED ORDER — BUDESONIDE-FORMOTEROL FUMARATE 160-4.5 MCG/ACT IN AERO: 2.0000 | INHALATION_SPRAY | Freq: Two times a day (BID) | RESPIRATORY_TRACT | 0 refills | Status: DC

## 2018-03-12 MED ORDER — PANTOPRAZOLE SODIUM 40 MG PO TBEC: 40.0000 mg | DELAYED_RELEASE_TABLET | Freq: Every day | ORAL | 5 refills | Status: DC

## 2018-03-12 MED FILL — PANTOPRAZOLE SOD DR 40 MG T: 40 | 30 days supply | Qty: 30 | Fill #0

## 2018-03-12 NOTE — Progress Notes (Signed)
Patient seen in the office today and instructed on use of Symbicort 160.  Patient expressed understanding and demonstrated technique. 

## 2018-03-12 NOTE — Patient Instructions (Signed)
Please continue to take your loratadine (Claritin) 10 mg every day. Please continue Flonase nasal spray, 2 sprays each nostril once daily. We will perform full pulmonary function testing at your next visit. Temporarily stop your Advair. We will temporarily start Symbicort 160/4.5 mcg, 2 puffs twice a day.  Remember to rinse and gargle after using this medication. Continue to keep your albuterol available to use 2 puffs up to every 4 hours if needed for shortness of breath, chest tightness, wheezing. Please start pantoprazole 40 mg daily until our next visit.  At that time we will decide whether we should continue it.  Take this medication either 1 hour before or 1 hour after eating. Depending on your improvement we will decide which medications to continue, whether we need to do further work-up including possible allergy testing, imaging, airway inspection. Follow with Melinda [name] in 1 month or next available with full PFT

## 2018-03-12 NOTE — Assessment & Plan Note (Signed)
Suspect contributions of only partially treated allergic rhinitis.  She may ultimately need allergy testing if we cannot get this under better control.  She may also has some occult GERD although she denies any overt symptoms and she has modified her diet significantly.  Also certainly consider breakthrough, inadequately controlled asthma.  We will try to address the potential upper airway impact of Advair by changing temporarily to Symbicort.  Please continue to take your loratadine (Claritin) 10 mg every day. Please continue Flonase nasal spray, 2 sprays each nostril once daily. We will perform full pulmonary function testing at your next visit. Temporarily stop your Advair. We will temporarily start Symbicort 160/4.5 mcg, 2 puffs twice a day.  Remember to rinse and gargle after using this medication. Continue to keep your albuterol available to use 2 puffs up to every 4 hours if needed for shortness of breath, chest tightness, wheezing. Please start pantoprazole 40 mg daily until our next visit.  At that time we will decide whether we should continue it.  Take this medication either 1 hour before or 1 hour after eating. Depending on your improvement we will decide which medications to continue, whether we need to do further work-up including possible allergy testing, imaging, airway inspection. Follow with Dr Lamonte Sakai in 1 month or next available with full PFT

## 2018-03-12 NOTE — Assessment & Plan Note (Signed)
Adjusting Advair to symbicort as above. Full PFT

## 2018-03-12 NOTE — Progress Notes (Signed)
Subjective:    Patient ID: Melinda [name], female    DOB: 04-14-1966, 52 y.o.   MRN: [id number]  HPI 52 year old obese woman with a history of tobacco use (20 pack years), history of diabetes, ADHD, scoliosis, seizures, thyroid CA treated surgically, latent TB, treated with INH x 6 month.  She had childhood asthma and carries a diagnosis of adult asthma that was made by . She has been dealing with a cough for the last 6 months, productive especially in the mornings.   Currently managed on Singulair, Advair Diskus, albuterol as needed.  She uses approximately 2-3x a week.  She is on loratadine once daily, has flonase and takes reliably. She is sensitive to flowers, grasses.  She denies any overt GERD sx, has changed her diet to help with this. Occasional darker nasal drainage.   CXR 09/19/17 >> normal.   Review of Systems  Constitutional: Negative for fever and unexpected weight change.  HENT: Positive for congestion. Negative for dental problem, ear pain, nosebleeds, postnasal drip, rhinorrhea, sinus pressure, sneezing, sore throat and trouble swallowing.   Eyes: Negative for redness and itching.  Respiratory: Positive for cough, chest tightness, shortness of breath and wheezing.   Cardiovascular: Negative for palpitations and leg swelling.  Gastrointestinal: Negative for nausea and vomiting.  Genitourinary: Negative for dysuria.  Musculoskeletal: Negative for joint swelling.  Skin: Negative for rash.  Neurological: Negative for headaches.  Hematological: Does not bruise/bleed easily.  Psychiatric/Behavioral: Negative for dysphoric mood. The patient is not nervous/anxious.    Past Medical History:  Diagnosis Date  . ADHD (attention deficit hyperactivity disorder)   . Arthritis   . Asthma   . Cancer ([facility])   . Chicken pox AGE 89  . Diabetes mellitus without complication ([facility])   . GERD (gastroesophageal reflux disease)   . Glaucoma    BOTH EYES, NO EYE DROPS  . Glaucoma   . History  of blood transfusion MARCH 2016    2UNITS GIVEN AND IRON GIVEN  . Hyperglycemia 09/09/2014  . Hypertension   . 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 Iron deficiency anemia due to chronic blood loss 09/09/2014  . Low TSH level 09/08/2014  . Lumbar herniated disc   . Menorrhagia 09/08/2014  . Migraine    CLUSTER AND MIGRAINES  . Multiple thyroid nodules   . Ovarian mass, right 09/09/2014  . PE (pulmonary embolism) 2016  . Peripheral neuropathy    FINGER TIPS AND TOES NUMB SOME  . Preeclampsia  1983   WITH PREGNANCY  . PTSD (post-traumatic stress disorder)   . Scoliosis   . Seizures ([facility]) AGE 5   NONE SINCE, HAD CHICKEN POX THEN     Family History  Problem Relation Age of Onset  . Diabetes Mother   . Breast cancer Mother   . CAD Mother   . Hypertension Mother   . Alcohol abuse Father   . Hypertension Father   . Breast cancer Maternal Aunt   . Breast cancer Maternal Aunt      Social History   Socioeconomic History  . Marital status: Single    Spouse name: Not on file  . Number of children: Not on file  . Years of education: Not on file  . Highest education level: Not on file  Occupational History  . Not on file  Social Needs  . Financial resource strain: Not on file  .  Food insecurity:    Worry: Not on file    Inability: Not on file  . Transportation needs:    Medical: Not on file    Non-medical: Not on file  Tobacco Use  . Smoking status: Former Smoker    Packs/day: 0.50    Years: 40.00    Pack years: 20.00    Types: Cigarettes    Last attempt to quit: 11/03/2014    Years since quitting: 3.3  . Smokeless tobacco: Never Used  Substance and Sexual Activity  . Alcohol use: No    Alcohol/week: 0.0 standard drinks  . Drug use: No  . Sexual activity: Never    Birth control/protection: None  Lifestyle  . Physical activity:    Days per week: Not on file    Minutes per session: Not on file    . Stress: Not on file  Relationships  . Social connections:    Talks on phone: Not on file    Gets together: Not on file    Attends religious service: Not on file    Active member of club or organization: Not on file    Attends meetings of clubs or organizations: Not on file    Relationship status: Not on file  . Intimate partner violence:    Fear of current or ex partner: Not on file    Emotionally abused: Not on file    Physically abused: Not on file    Forced sexual activity: Not on file  Other Topics Concern  . Not on file  Social History Narrative  . Not on file  has lived in [place], [place], [place], [place] Has worked as  Allergies  Allergen Reactions  . Ceftriaxone Anaphylaxis    ROCEPHIN  . Penicillins Shortness Of Breath    Has patient had a PCN reaction causing immediate rash, facial/tongue/throat swelling, SOB or lightheadedness with hypotension: Yes Has patient had a PCN reaction causing severe rash involving mucus membranes or skin necrosis: No Has patient had a PCN reaction that required hospitalization No Has patient had a PCN reaction occurring within the last 10 years: No If all of the above answers are "NO", then [name] proceed with Cephalosporin use.   . Shellfish Allergy Anaphylaxis  . Gold-Containing Drug Products     HANDS ITCH  . Nickel     HANDS SWELL  . Citrus Rash     Outpatient Medications Prior to Visit  Medication Sig Dispense Refill  . ACCU-CHEK AVIVA PLUS test strip USE TO TEST BLOOD GLUCOSE FOUR TIMES DAILY BEFORE MEALS AND AT BEDTIME 100 each 12  . ACCU-CHEK SOFTCLIX LANCETS lancets   3  . albuterol (PROVENTIL) (2.5 MG/3ML) 0.083% nebulizer solution TAKE 3 MLS (2.5 MG TOTAL) BY NEBULIZATION EVERY 6 HOURS AS NEEDED FORWHEEZING OR SHORTNESS OF BREATH (Patient taking differently: Take 2.5 mg by nebulization every 6 (six) hours as needed for wheezing or shortness of breath. ) 150 mL 1  . amitriptyline (ELAVIL) 50 MG tablet Take 50 mg by mouth at bedtime.    Marland Kitchen  atorvastatin (LIPITOR) 20 MG tablet Take 1 tablet (20 mg total) by mouth daily. 90 tablet 1  . buPROPion (WELLBUTRIN XL) 300 MG 24 hr tablet Take 300 mg by mouth daily.   2  . busPIRone (BUSPAR) 5 MG tablet Take 5 mg by mouth 3 (three) times daily.    . citalopram (CELEXA) 40 MG tablet Take 40 mg by mouth daily.   2  . eletriptan (RELPAX) 40 MG tablet  Take 1 tablet (40 mg total) by mouth as needed for migraine or headache. [name] repeat in 2 hours if headache persists or recurs. 10 tablet 1  . ELIQUIS 5 MG TABS tablet TAKE 1 TABLET BY MOUTH TWICE DAILY 60 tablet 6  . fluticasone (CUTIVATE) 0.05 % cream Apply topically 2 (two) times daily. 30 g 2  . fluticasone (FLONASE) 50 MCG/ACT nasal spray PLACE 2 SPRAYS INTO BOTH NOSTRILS DAILY (Patient taking differently: Place 2 sprays into both nostrils 2 (two) times daily. ) 16 g 2  . Fluticasone-Salmeterol (ADVAIR DISKUS) 250-50 MCG/DOSE AEPB Inhale 1 puff into the lungs 2 (two) times daily. 60 each 7  . gabapentin (NEURONTIN) 100 MG capsule Take 1 capsule (100 mg total) by mouth 3 (three) times daily. 90 capsule 5  . HUMALOG KWIKPEN 100 UNIT/ML KiwkPen Inject 8 Units into the skin 3 (three) times daily.   5  . hydrochlorothiazide (MICROZIDE) 12.5 MG capsule TAKE 1 CAPSULE BY MOUTH ONCE DAILY 30 capsule 2  . LANTUS SOLOSTAR 100 UNIT/ML Solostar Pen Inject 24 Units into the skin daily at 10 pm.   0  . levothyroxine (SYNTHROID, LEVOTHROID) 175 MCG tablet Take 1 tablet (175 mcg total) by mouth daily. 30 tablet 0  . loratadine (CLARITIN) 10 MG tablet Take 1 tablet (10 mg total) by mouth daily. 30 tablet 11  . metFORMIN (GLUCOPHAGE) 850 MG tablet Take 1 tablet (850 mg total) by mouth 2 (two) times daily with a meal. 180 tablet 3  . methocarbamol (ROBAXIN) 500 MG tablet TAKE 1 TABLET BY MOUTH EVERY 8 HOURS AS NEEDED FOR MUSCLE SPASMS 40 tablet 1  . metoprolol tartrate (LOPRESSOR) 50 MG tablet TAKE 1 TABLET BY MOUTH TWICE DAILY 60 tablet 2  . montelukast (SINGULAIR)  10 MG tablet TAKE 1 TABLET BY MOUTH EVERY NIGHT AT BEDTIME 30 tablet 2  . prazosin (MINIPRESS) 1 MG capsule Take 1 mg by mouth at bedtime.    . topiramate (TOPAMAX) 100 MG tablet Take 1 tablet (100 mg total) by mouth at bedtime. 30 tablet 3  . traMADol (ULTRAM) 50 MG tablet Take 1 tablet (50 mg total) by mouth daily as needed for moderate pain or severe pain. 30 tablet 1  . UNIFINE PENTIPS 32G X 4 MM MISC   0   No facility-administered medications prior to visit.        Objective:   Physical Exam Vitals:   03/12/18 1147  BP: 104/78  Pulse: 85  SpO2: 97%  Weight: (!) 339 lb (153.8 kg)  Height: 5\' 10"  (1.778 m)   Gen: Pleasant, obese woman, in no distress,  normal affect  ENT: No lesions,  mouth clear,  oropharynx clear, no postnasal drip  Neck: No JVD, no stridor  Lungs: No use of accessory muscles, no wheeze or crackles.   Cardiovascular: RRR, heart sounds normal, no murmur or gallops, no peripheral edema  Musculoskeletal: No deformities, no cyanosis or clubbing  Neuro: alert, non focal  Skin: Warm, no lesions or rash       Assessment & Plan:  Chronic cough Suspect contributions of only partially treated allergic rhinitis.  She [name] ultimately need allergy testing if we cannot get this under better control.  She [name] also has some occult GERD although she denies any overt symptoms and she has modified her diet significantly.  Also certainly consider breakthrough, inadequately controlled asthma.  We will try to address the potential upper airway impact of Advair by changing temporarily to Symbicort.  Please  continue to take your loratadine (Claritin) 10 mg every day. Please continue Flonase nasal spray, 2 sprays each nostril once daily. We will perform full pulmonary function testing at your next visit. Temporarily stop your Advair. We will temporarily start Symbicort 160/4.5 mcg, 2 puffs twice a day.  Remember to rinse and gargle after using this medication. Continue to  keep your albuterol available to use 2 puffs up to every 4 hours if needed for shortness of breath, chest tightness, wheezing. Please start pantoprazole 40 mg daily until our next visit.  At that time we will decide whether we should continue it.  Take this medication either 1 hour before or 1 hour after eating. Depending on your improvement we will decide which medications to continue, whether we need to do further work-up including possible allergy testing, imaging, airway inspection. Follow with Dr Lamonte Sakai in 1 month or next available with full PFT  Moderate persistent asthma Adjusting Advair to symbicort as above. Full PFT  Baltazar Apo, MD, PhD 03/12/2018, 12:13 PM [facility] [phone] or if no answer [phone]

## 2018-03-18 DIAGNOSIS — F411 Generalized anxiety disorder: Secondary | ICD-10-CM | POA: Diagnosis not present

## 2018-03-20 DIAGNOSIS — F411 Generalized anxiety disorder: Secondary | ICD-10-CM | POA: Diagnosis not present

## 2018-03-20 DIAGNOSIS — F331 Major depressive disorder, recurrent, moderate: Secondary | ICD-10-CM | POA: Diagnosis not present

## 2018-03-20 DIAGNOSIS — F4310 Post-traumatic stress disorder, unspecified: Secondary | ICD-10-CM | POA: Diagnosis not present

## 2018-03-20 MED FILL — traZODone HCL 50 MG TABS: 50 | 30 days supply | Qty: 60 | Fill #0

## 2018-03-25 DIAGNOSIS — Z7689 Persons encountering health services in other specified circumstances: Secondary | ICD-10-CM | POA: Diagnosis not present

## 2018-03-25 DIAGNOSIS — F411 Generalized anxiety disorder: Secondary | ICD-10-CM | POA: Diagnosis not present

## 2018-03-31 DIAGNOSIS — E1165 Type 2 diabetes mellitus with hyperglycemia: Secondary | ICD-10-CM | POA: Diagnosis not present

## 2018-04-07 MED FILL — ELIQUIS 5 MG TABLET: 5 | 30 days supply | Qty: 60 | Fill #5

## 2018-04-07 MED FILL — METHOCARBAMOL 500 MG TABLET: 500 | 13 days supply | Qty: 40 | Fill #1

## 2018-04-07 MED FILL — METOPROLOL TARTRATE 50 MG T: 50 | 30 days supply | Qty: 60 | Fill #2

## 2018-04-08 DIAGNOSIS — F411 Generalized anxiety disorder: Secondary | ICD-10-CM | POA: Diagnosis not present

## 2018-04-08 MED FILL — buPROPion HCL ER (XL) 300 M: 300 | 30 days supply | Qty: 30 | Fill #2

## 2018-04-08 MED FILL — TOPIRAMATE 100 MG TABLET: 100 | 30 days supply | Qty: 30 | Fill #3

## 2018-04-08 MED FILL — HYDROCHLOROTHIAZIDE 12.5 MG: 12.5 | 30 days supply | Qty: 30 | Fill #1

## 2018-04-08 MED FILL — SYNTHROID 175 MCG TABLET: 175 | 30 days supply | Qty: 30 | Fill #2

## 2018-04-08 MED FILL — PANTOPRAZOLE SOD DR 40 MG T: 40 | 30 days supply | Qty: 30 | Fill #1

## 2018-04-08 MED FILL — AMITRIPTYLINE HCL 50 MG TAB: 50 | 30 days supply | Qty: 30 | Fill #2

## 2018-04-08 MED FILL — PRAZOSIN 1 MG CAPSULE: 1 | 30 days supply | Qty: 30 | Fill #0

## 2018-04-08 MED FILL — CITALOPRAM HBR 40 MG TABLET: 40 | 30 days supply | Qty: 30 | Fill #2

## 2018-04-10 NOTE — Progress Notes (Addendum)
NEUROLOGY FOLLOW UP OFFICE NOTE  Lysbeth Dicola [id number]  HISTORY OF PRESENT ILLNESS: Melinda [name] is a 52 year old right-handed female with type 2 diabetes, asthma, iron deficiency anemia, chronic low back pain, PE/DVT, PTSD and history of thyroid cancer status post thyroidectomy who follows up for migraine.  UPDATE: She was unable to break the topiramate tablet so she remained on 100mg .  She hasn't taken Relpax because she hasn't received clearance from her insurance. Intensity:  8/10 Duration:  Several hours Frequency:  1 to 2 days a week but 15 headache days a month. Frequency of abortive medication: Has not been treating migraines. Current NSAIDS:  Contraindicated (on anticoagulation) Current analgesics:  Acetaminophen, tramadol Current triptans:  none Current ergotamine:  none Current anti-emetic:  Zofran ODT 4mg  Current muscle relaxants:  methocarbamil Current anti-anxiolytic:  none Current sleep aide:  Amitriptyline 50mg  at bedtime Current Antihypertensive medications:  Metoprolol 50mg  twice daily, HCTZ Current Antidepressant medications:  Amitriptyline 50mg , Wellbutrin 150mg , citalopram 10mg  Current Anticonvulsant medications:  topiramate 100mg , gabapentin 100mg  three times daily Current anti-CGRP:  None Current Vitamins/Herbal/Supplements:  Ferrous sulfate Current Antihistamines/Decongestants:  Flonase Other therapy:  Sees psychiatrist and psychologist which helps  Caffeine: No Alcohol: No Smoker: No Diet: Hydrates.  No soda.  Eats baked and boiled chicken.  No fried foods. Exercise: Stationary bike, limited due to chronic pain Depression: Yes; Anxiety: Yes Other pain: Chronic pain Sleep hygiene: Poor.  Has night terrors secondary to PTSD.  She has glaucoma and is evaluated by her eye doctor every 6 months.  She reports last visit her glaucoma looked better.  02/18/18 BMI:  Na 140, K 3.4, Cl 106, CO2 24, glucose 91, BUN 12, Cr 0.93  HISTORY:  Onset: Since her  38s. Location:  Right sided Quality:  squeezing Initial Intensity:  8.5/10 (sometimes 10/10) Aura:  Sometimes preceded with visual aura (sees a pinwheel) Prodrome:  no Associated symptoms: Nausea, photophobia, phonophobia.  No worse headache of life, change in quality, wakes her up from sleep. Initial Duration:  3 hours to all day; May: 45 minutes with Maxalt (reduces intensity to manageable 4/10 for another couple of hours) Initial Frequency:  daily; May:  5 to 10 days per month in a cluster during her cycle Triggers: Apples, aged cheese, menstrual cycle Relieving factors:  no Activity:  Light activity aggravates  Past NSAIDS:  ibuprofen Past analgesics:  Excedrin Past abortive triptans:  Sumatriptan tablet/NS/[place], rizatriptan 10mg  Past muscle relaxants:  no Past anti-emetic:  no Past antihypertensive medications:  no Past antidepressant medications:  no Past anticonvulsant medications:  no Past vitamins/Herbal/Supplements:  no Past antihistamines/decongestants:  no Other past therapies:  no  Family history of headache:  Son has migraines.  CT of head from 01/16/15 was unremarkable.  In September 2018, she developed a "thunderclap" headache.  Onset was over a month ago.  It lasts for a couple of seconds and occurs once a week.  She has not had any vision loss, unilateral numbness or weakness or vertigo.  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    PAST MEDICAL HISTORY: Past Medical History:  Diagnosis Date  . ADHD (attention deficit hyperactivity disorder)   . Arthritis   . Asthma   . Cancer ([place])   . Chicken pox AGE 25  . Diabetes mellitus without complication ([place])   . GERD (gastroesophageal reflux disease)   . Glaucoma  BOTH EYES, NO EYE DROPS  . Glaucoma   . History of blood transfusion MARCH 2016    2UNITS GIVEN AND IRON  GIVEN  . Hyperglycemia 09/09/2014  . Hypertension   . 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 Iron deficiency anemia due to chronic blood loss 09/09/2014  . Low TSH level 09/08/2014  . Lumbar herniated disc   . Menorrhagia 09/08/2014  . Migraine    CLUSTER AND MIGRAINES  . Multiple thyroid nodules   . Ovarian mass, right 09/09/2014  . PE (pulmonary embolism) 2016  . Peripheral neuropathy    FINGER TIPS AND TOES NUMB SOME  . Preeclampsia  1983   WITH PREGNANCY  . PTSD (post-traumatic stress disorder)   . Scoliosis   . Seizures ([place]) AGE 21   NONE SINCE, HAD CHICKEN POX THEN    MEDICATIONS: Current Outpatient Medications on File Prior to Visit  Medication Sig Dispense Refill  . ACCU-CHEK AVIVA PLUS test strip USE TO TEST BLOOD GLUCOSE FOUR TIMES DAILY BEFORE MEALS AND AT BEDTIME 100 each 12  . ACCU-CHEK SOFTCLIX LANCETS lancets   3  . albuterol (PROVENTIL) (2.5 MG/3ML) 0.083% nebulizer solution TAKE 3 MLS (2.5 MG TOTAL) BY NEBULIZATION EVERY 6 HOURS AS NEEDED FORWHEEZING OR SHORTNESS OF BREATH (Patient taking differently: Take 2.5 mg by nebulization every 6 (six) hours as needed for wheezing or shortness of breath. ) 150 mL 1  . amitriptyline (ELAVIL) 50 MG tablet Take 50 mg by mouth at bedtime.    Marland Kitchen atorvastatin (LIPITOR) 20 MG tablet Take 1 tablet (20 mg total) by mouth daily. 90 tablet 1  . budesonide-formoterol (SYMBICORT) 160-4.5 MCG/ACT inhaler Inhale 2 puffs into the lungs 2 (two) times daily. 2 Inhaler 0  . buPROPion (WELLBUTRIN XL) 300 MG 24 hr tablet Take 300 mg by mouth daily.   2  . busPIRone (BUSPAR) 5 MG tablet Take 5 mg by mouth 3 (three) times daily.    . citalopram (CELEXA) 40 MG tablet Take 40 mg by mouth daily.   2  . eletriptan (RELPAX) 40 MG tablet Take 1 tablet (40 mg total) by mouth as needed for migraine or headache. May repeat in 2 hours if headache persists or recurs. 10 tablet 1  . ELIQUIS 5  MG TABS tablet TAKE 1 TABLET BY MOUTH TWICE DAILY 60 tablet 6  . fluticasone (CUTIVATE) 0.05 % cream Apply topically 2 (two) times daily. 30 g 2  . fluticasone (FLONASE) 50 MCG/ACT nasal spray PLACE 2 SPRAYS INTO BOTH NOSTRILS DAILY (Patient taking differently: Place 2 sprays into both nostrils 2 (two) times daily. ) 16 g 2  . Fluticasone-Salmeterol (ADVAIR DISKUS) 250-50 MCG/DOSE AEPB Inhale 1 puff into the lungs 2 (two) times daily. 60 each 7  . gabapentin (NEURONTIN) 100 MG capsule Take 1 capsule (100 mg total) by mouth 3 (three) times daily. 90 capsule 5  . HUMALOG KWIKPEN 100 UNIT/ML KiwkPen Inject 8 Units into the skin 3 (three) times daily.   5  . hydrochlorothiazide (MICROZIDE) 12.5 MG capsule TAKE 1 CAPSULE BY MOUTH ONCE DAILY 30 capsule 2  . LANTUS SOLOSTAR 100 UNIT/ML Solostar Pen Inject 24 Units into the skin daily at 10 pm.   0  . levothyroxine (SYNTHROID, LEVOTHROID) 175 MCG tablet Take 1 tablet (175 mcg total) by mouth daily. 30 tablet 0  . loratadine (CLARITIN) 10 MG tablet Take 1 tablet (10 mg total)  by mouth daily. 30 tablet 11  . metFORMIN (GLUCOPHAGE) 850 MG tablet Take 1 tablet (850 mg total) by mouth 2 (two) times daily with a meal. 180 tablet 3  . methocarbamol (ROBAXIN) 500 MG tablet TAKE 1 TABLET BY MOUTH EVERY 8 HOURS AS NEEDED FOR MUSCLE SPASMS 40 tablet 1  . metoprolol tartrate (LOPRESSOR) 50 MG tablet TAKE 1 TABLET BY MOUTH TWICE DAILY 60 tablet 2  . montelukast (SINGULAIR) 10 MG tablet TAKE 1 TABLET BY MOUTH EVERY NIGHT AT BEDTIME 30 tablet 2  . pantoprazole (PROTONIX) 40 MG tablet Take 1 tablet (40 mg total) by mouth daily. 30 tablet 5  . prazosin (MINIPRESS) 1 MG capsule Take 1 mg by mouth at bedtime.    . topiramate (TOPAMAX) 100 MG tablet Take 1 tablet (100 mg total) by mouth at bedtime. 30 tablet 3  . traMADol (ULTRAM) 50 MG tablet Take 1 tablet (50 mg total) by mouth daily as needed for moderate pain or severe pain. 30 tablet 1  . UNIFINE PENTIPS 32G X 4 MM MISC    0   No current facility-administered medications on file prior to visit.     ALLERGIES: Allergies  Allergen Reactions  . Ceftriaxone Anaphylaxis    ROCEPHIN  . Penicillins Shortness Of Breath    Has patient had a PCN reaction causing immediate rash, facial/tongue/throat swelling, SOB or lightheadedness with hypotension: Yes Has patient had a PCN reaction causing severe rash involving mucus membranes or skin necrosis: No Has patient had a PCN reaction that required hospitalization No Has patient had a PCN reaction occurring within the last 10 years: No If all of the above answers are "NO", then may proceed with Cephalosporin use.   . Shellfish Allergy Anaphylaxis  . Gold-Containing Drug Products     HANDS ITCH  . [place]     HANDS SWELL  . Citrus Rash    FAMILY HISTORY: Family History  Problem Relation Age of Onset  . Diabetes Mother   . Breast cancer Mother   . CAD Mother   . Hypertension Mother   . Alcohol abuse Father   . Hypertension Father   . Breast cancer Maternal Aunt   . Breast cancer Maternal Aunt     SOCIAL HISTORY: Social History   Socioeconomic History  . Marital status: Single    Spouse name: Not on file  . Number of children: Not on file  . Years of education: Not on file  . Highest education level: Not on file  Occupational History  . Not on file  Social Needs  . Financial resource strain: Not on file  . Food insecurity:    Worry: Not on file    Inability: Not on file  . Transportation needs:    Medical: Not on file    Non-medical: Not on file  Tobacco Use  . Smoking status: Former Smoker    Packs/day: 0.50    Years: 40.00    Pack years: 20.00    Types: Cigarettes    Last attempt to quit: 11/03/2014    Years since quitting: 3.4  . Smokeless tobacco: Never Used  Substance and Sexual Activity  . Alcohol use: No    Alcohol/week: 0.0 standard drinks  . Drug use: No  . Sexual activity: Never    Birth control/protection: None  Lifestyle    . Physical activity:    Days per week: Not on file    Minutes per session: Not on file  . Stress: Not on  file  Relationships  . Social connections:    Talks on phone: Not on file    Gets together: Not on file    Attends religious service: Not on file    Active member of club or organization: Not on file    Attends meetings of clubs or organizations: Not on file    Relationship status: Not on file  . Intimate partner violence:    Fear of current or ex partner: Not on file    Emotionally abused: Not on file    Physically abused: Not on file    Forced sexual activity: Not on file  Other Topics Concern  . Not on file  Social History Narrative  . Not on file    REVIEW OF SYSTEMS: Constitutional: No fevers, chills, or sweats, no generalized fatigue, change in appetite Eyes: No visual changes, double vision, eye pain Ear, nose and throat: No hearing loss, ear pain, nasal congestion, sore throat Cardiovascular: No chest pain, palpitations Respiratory:  No shortness of breath at rest or with exertion, wheezes GastrointestinaI: No nausea, vomiting, diarrhea, abdominal pain, fecal incontinence Genitourinary:  No dysuria, urinary retention or frequency Musculoskeletal:  No neck pain, back pain Integumentary: No rash, pruritus, skin lesions Neurological: as above Psychiatric: No depression, insomnia, anxiety Endocrine: No palpitations, fatigue, diaphoresis, mood swings, change in appetite, change in weight, increased thirst Hematologic/Lymphatic:  No purpura, petechiae. Allergic/Immunologic: no itchy/runny eyes, nasal congestion, recent allergic reactions, rashes  PHYSICAL EXAM: Blood pressure 128/82, pulse 94, height 5\' 10"  (1.778 m), weight (!) 332 lb (150.6 kg), SpO2 95 %. General: No acute distress.  Patient appears well-groomed.   Head:  Normocephalic/atraumatic Eyes:  Fundi examined but not visualized Neck: supple, no paraspinal tenderness, full range of motion Heart:  Regular  rate and rhythm Lungs:  Clear to auscultation bilaterally Back: No paraspinal tenderness Neurological Exam: alert and oriented to person, place, and time. Attention span and concentration intact, recent and remote memory intact, fund of knowledge intact.  Speech fluent and not dysarthric, language intact.  CN II-XII intact. Bulk and tone normal, muscle strength 5/5 throughout.  Sensation to light touch ntact.  Deep tendon reflexes 2+ throughout, toes downgoing.  Finger to nose and heel to shin testing intact.  Gait antalgic with cane.  Romberg negative.  IMPRESSION: Chronic migraine without aura, without status migrainosus, not intractable Sometimes migraine with aura, without status migrainosus, not intractable Morbid obesity (BMI 47.64)  PLAN: 1.  For preventative management, we will titrate topiramate to 150 mg at bedtime for 1 week, then 200 mg at bedtime.  She is aware that topiramate may aggravate glaucoma.  Therefore, she is to continue her routine follow-up with her eye doctor.  If she notes any worsening of vision, she should see her eye doctor sooner.  If there is any increase or worsening glaucoma, we will need to discontinue topiramate. 2.  For abortive therapy, we will resubmit preauthorization for eletriptan 40 mg. 3.  Limit use of pain relievers to no more than 2 days out of week to prevent risk of rebound or medication-overuse headache. 4.  Keep headache diary 5.  Exercise, hydration, weight loss, caffeine cessation, sleep hygiene, monitor for and avoid triggers 6.  Consider:  magnesium citrate 400mg  daily, riboflavin 400mg  daily, and coenzyme Q10 100mg  three times daily 7.  Follow up in 4 months   Metta Clines, DO  CC: [name], MD

## 2018-04-13 ENCOUNTER — Ambulatory Visit (INDEPENDENT_AMBULATORY_CARE_PROVIDER_SITE_OTHER): Payer: Medicaid Other | Admitting: Emergency Medicine

## 2018-04-13 ENCOUNTER — Ambulatory Visit: Payer: Medicaid Other | Admitting: Neurology

## 2018-04-13 ENCOUNTER — Encounter: Payer: Self-pay | Admitting: Neurology

## 2018-04-13 VITALS — BP 128/82 | HR 94 | Ht 70.0 in | Wt 332.0 lb

## 2018-04-13 DIAGNOSIS — G43009 Migraine without aura, not intractable, without status migrainosus: Secondary | ICD-10-CM | POA: Diagnosis not present

## 2018-04-13 DIAGNOSIS — R05 Cough: Secondary | ICD-10-CM | POA: Diagnosis not present

## 2018-04-13 DIAGNOSIS — G43709 Chronic migraine without aura, not intractable, without status migrainosus: Secondary | ICD-10-CM

## 2018-04-13 DIAGNOSIS — G43109 Migraine with aura, not intractable, without status migrainosus: Secondary | ICD-10-CM

## 2018-04-13 DIAGNOSIS — R053 Chronic cough: Secondary | ICD-10-CM

## 2018-04-13 DIAGNOSIS — E890 Postprocedural hypothyroidism: Secondary | ICD-10-CM | POA: Diagnosis not present

## 2018-04-13 LAB — PULMONARY FUNCTION TEST
DL/VA % pred: 85 %
DL/VA: 4.57 ml/min/mmHg/L
DLCO COR % PRED: 66 %
DLCO COR: 20.52 ml/min/mmHg
DLCO UNC: 22.53 ml/min/mmHg
DLCO unc % pred: 72 %
FEF 25-75 POST: 1.61 L/s
FEF 25-75 Pre: 1.86 L/sec
FEF2575-%Change-Post: -13 %
FEF2575-%PRED-POST: 56 %
FEF2575-%PRED-PRE: 65 %
FEV1-%Change-Post: -2 %
FEV1-%PRED-POST: 83 %
FEV1-%Pred-Pre: 85 %
FEV1-POST: 2.33 L
FEV1-Pre: 2.39 L
FEV1FVC-%CHANGE-POST: 3 %
FEV1FVC-%PRED-PRE: 90 %
FEV6-%CHANGE-POST: -3 %
FEV6-%Pred-Post: 90 %
FEV6-%Pred-Pre: 93 %
FEV6-PRE: 3.18 L
FEV6-Post: 3.07 L
FEV6FVC-%PRED-POST: 102 %
FEV6FVC-%Pred-Pre: 102 %
FVC-%Change-Post: -5 %
FVC-%PRED-PRE: 93 %
FVC-%Pred-Post: 88 %
FVC-PRE: 3.27 L
FVC-Post: 3.07 L
PRE FEV1/FVC RATIO: 73 %
Post FEV1/FVC ratio: 76 %
Post FEV6/FVC ratio: 100 %
Pre FEV6/FVC Ratio: 100 %
RV % pred: 92 %
RV: 1.88 L
TLC % pred: 85 %
TLC: 4.93 L

## 2018-04-13 MED ORDER — TOPIRAMATE 50 MG PO TABS: ORAL_TABLET | ORAL | 0 refills | Status: DC

## 2018-04-13 MED ORDER — ELETRIPTAN HYDROBROMIDE 40 MG PO TABS: 40.0000 mg | ORAL_TABLET | ORAL | 1 refills | Status: DC | PRN

## 2018-04-13 MED FILL — TOPIRAMATE 50 MG TABLET: 50 | 30 days supply | Qty: 113 | Fill #0

## 2018-04-13 NOTE — Progress Notes (Signed)
PFT completed today. 04/13/18  

## 2018-04-13 NOTE — Patient Instructions (Addendum)
1.  I will switch the topiramate to 50mg  tablet.  Take 3 tablets at bedtime for 7 days, then increase to 4 tablets (200mg ) at bedtime.  Contact us at end of prescription for refill.  If doing well, will continue 200mg  at bedtime.  If not improved, then recommend one of the new monthly migraine shots or Botox.  As topiramate may aggravate glaucoma, continue 6 month check ups.  If you notice worsening vision, then see your eye doctor sooner and if glaucoma worse, then we will have to stop it.   2.  We will try to get the eletriptan covered.  3.  Limit use of pain relievers to no more than 2 days out of week to prevent risk of rebound or medication-overuse headache. 4.  Keep headache diary 5.  Try to lose weight 6.  Follow up in 4 months.

## 2018-04-15 DIAGNOSIS — F411 Generalized anxiety disorder: Secondary | ICD-10-CM | POA: Diagnosis not present

## 2018-04-22 DIAGNOSIS — F411 Generalized anxiety disorder: Secondary | ICD-10-CM | POA: Diagnosis not present

## 2018-04-22 DIAGNOSIS — Z7689 Persons encountering health services in other specified circumstances: Secondary | ICD-10-CM | POA: Diagnosis not present

## 2018-04-28 ENCOUNTER — Ambulatory Visit (INDEPENDENT_AMBULATORY_CARE_PROVIDER_SITE_OTHER): Payer: Medicaid Other | Admitting: Emergency Medicine

## 2018-04-28 ENCOUNTER — Encounter: Payer: Self-pay | Admitting: Emergency Medicine

## 2018-04-28 DIAGNOSIS — R05 Cough: Secondary | ICD-10-CM | POA: Diagnosis not present

## 2018-04-28 DIAGNOSIS — J4530 Mild persistent asthma, uncomplicated: Secondary | ICD-10-CM

## 2018-04-28 DIAGNOSIS — Z23 Encounter for immunization: Secondary | ICD-10-CM | POA: Diagnosis not present

## 2018-04-28 DIAGNOSIS — R053 Chronic cough: Secondary | ICD-10-CM

## 2018-04-28 NOTE — Progress Notes (Signed)
Subjective:    Patient ID: Melinda [name], female    DOB: 11/03/1965, 52 y.o.   MRN: [id number]  HPI 52 year old obese woman with a history of tobacco use (20 pack years), history of diabetes, ADHD, scoliosis, seizures, thyroid CA treated surgically, latent TB, treated with INH x 6 month.  She had childhood asthma and carries a diagnosis of adult asthma that was made by . She has been dealing with a cough for the last 6 months, productive especially in the mornings.   Currently managed on Singulair, Advair Diskus, albuterol as needed.  She uses approximately 2-3x a week.  She is on loratadine once daily, has flonase and takes reliably. She is sensitive to flowers, grasses.  She denies any overt GERD sx, has changed her diet to help with this. Occasional darker nasal drainage.   CXR 09/19/17 >> normal.   ROV 04/28/18 --this is a follow-up visit for patient with a history of tobacco use, latent TB (treated), asthma.  I saw her one month ago for her asthma and especially for chronic cough.  The most overt contributor appeared to be allergic rhinitis.  We continued loratadine, fluticasone nasal spray, singulair.  I changed her Advair to Symbicort to see if this would be easier on her upper airway.  We empirically added pantoprazole to see if she would benefit.  Pulmonary function testing done on 04/13/2018 was reviewed by me.  This shows mild obstruction without a bronchodilator response, normal lung volumes, decreased diffusion capacity that corrects to the normal range when adjusted for alveolar volume. She reports that her cough has been better, less frequent. She can still get chest tightness and also cough esp when exposed to perfumes, etc. She isn't sure she would be able to tolerate [place]    Review of Systems  Constitutional: Negative for fever and unexpected weight change.  HENT: Positive for congestion. Negative for dental problem, ear pain, nosebleeds, postnasal drip, rhinorrhea, sinus pressure,  sneezing, sore throat and trouble swallowing.   Eyes: Negative for redness and itching.  Respiratory: Positive for cough, chest tightness, shortness of breath and wheezing.   Cardiovascular: Negative for palpitations and leg swelling.  Gastrointestinal: Negative for nausea and vomiting.  Genitourinary: Negative for dysuria.  Musculoskeletal: Negative for joint swelling.  Skin: Negative for rash.  Neurological: Negative for headaches.  Hematological: Does not bruise/bleed easily.  Psychiatric/Behavioral: Negative for dysphoric mood. The patient is not nervous/anxious.    Past Medical History:  Diagnosis Date  . ADHD (attention deficit hyperactivity disorder)   . Arthritis   . Asthma   . Cancer ([place])   . Chicken pox AGE 28  . Diabetes mellitus without complication ([place])   . GERD (gastroesophageal reflux disease)   . Glaucoma    BOTH EYES, NO EYE DROPS  . Glaucoma   . History of blood transfusion MARCH 2016    2UNITS GIVEN AND IRON GIVEN  . Hyperglycemia 09/09/2014  . Hypertension   . 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 Iron deficiency anemia due to chronic blood loss 09/09/2014  . Low TSH level 09/08/2014  . Lumbar herniated disc   . Menorrhagia 09/08/2014  . Migraine    CLUSTER AND MIGRAINES  . Multiple thyroid nodules   . Ovarian mass, right 09/09/2014  . PE (pulmonary embolism) 2016  . Peripheral neuropathy    FINGER TIPS AND TOES NUMB SOME  . Preeclampsia  1983   WITH PREGNANCY  . PTSD (post-traumatic stress disorder)   . Scoliosis   . Seizures ([facility]) AGE 43   NONE SINCE, HAD CHICKEN POX THEN     Family History  Problem Relation Age of Onset  . Diabetes Mother   . Breast cancer Mother   . CAD Mother   . Hypertension Mother   . Alcohol abuse Father   . Hypertension Father   . Breast cancer Maternal Aunt   . Breast cancer Maternal Aunt      Social History   Socioeconomic History  .  Marital status: Single    Spouse name: Not on file  . Number of children: Not on file  . Years of education: Not on file  . Highest education level: Not on file  Occupational History  . Not on file  Social Needs  . Financial resource strain: Not on file  . Food insecurity:    Worry: Not on file    Inability: Not on file  . Transportation needs:    Medical: Not on file    Non-medical: Not on file  Tobacco Use  . Smoking status: Former Smoker    Packs/day: 0.50    Years: 40.00    Pack years: 20.00    Types: Cigarettes    Last attempt to quit: 11/03/2014    Years since quitting: 3.4  . Smokeless tobacco: Never Used  Substance and Sexual Activity  . Alcohol use: No    Alcohol/week: 0.0 standard drinks  . Drug use: No  . Sexual activity: Never    Birth control/protection: None  Lifestyle  . Physical activity:    Days per week: Not on file    Minutes per session: Not on file  . Stress: Not on file  Relationships  . Social connections:    Talks on phone: Not on file    Gets together: Not on file    Attends religious service: Not on file    Active member of club or organization: Not on file    Attends meetings of clubs or organizations: Not on file    Relationship status: Not on file  . Intimate partner violence:    Fear of current or ex partner: Not on file    Emotionally abused: Not on file    Physically abused: Not on file    Forced sexual activity: Not on file  Other Topics Concern  . Not on file  Social History Narrative  . Not on file  has lived in [place], [place], [place], [place] Has worked as  Allergies  Allergen Reactions  . Ceftriaxone Anaphylaxis    ROCEPHIN  . Penicillins Shortness Of Breath    Has patient had a PCN reaction causing immediate rash, facial/tongue/throat swelling, SOB or lightheadedness with hypotension: Yes Has patient had a PCN reaction causing severe rash involving mucus membranes or skin necrosis: No Has patient had a PCN reaction that required  hospitalization No Has patient had a PCN reaction occurring within the last 10 years: No If all of the above answers are "NO", then [name] proceed with Cephalosporin use.   . Shellfish Allergy Anaphylaxis  . Gold-Containing Drug Products     HANDS ITCH  . Nickel     HANDS SWELL  . Citrus Rash     Outpatient Medications Prior to Visit  Medication Sig Dispense Refill  . ACCU-CHEK AVIVA PLUS test strip USE TO TEST BLOOD GLUCOSE FOUR TIMES DAILY BEFORE MEALS AND AT BEDTIME 100 each 12  .  ACCU-CHEK SOFTCLIX LANCETS lancets   3  . albuterol (PROVENTIL) (2.5 MG/3ML) 0.083% nebulizer solution TAKE 3 MLS (2.5 MG TOTAL) BY NEBULIZATION EVERY 6 HOURS AS NEEDED FORWHEEZING OR SHORTNESS OF BREATH (Patient taking differently: Take 2.5 mg by nebulization every 6 (six) hours as needed for wheezing or shortness of breath. ) 150 mL 1  . atorvastatin (LIPITOR) 20 MG tablet Take 1 tablet (20 mg total) by mouth daily. 90 tablet 1  . budesonide-formoterol (SYMBICORT) 160-4.5 MCG/ACT inhaler Inhale 2 puffs into the lungs 2 (two) times daily. 2 Inhaler 0  . buPROPion (WELLBUTRIN XL) 300 MG 24 hr tablet Take 300 mg by mouth daily.   2  . busPIRone (BUSPAR) 5 MG tablet Take 5 mg by mouth 3 (three) times daily.    . citalopram (CELEXA) 40 MG tablet Take 40 mg by mouth daily.   2  . eletriptan (RELPAX) 40 MG tablet Take 1 tablet (40 mg total) by mouth as needed for migraine or headache. [name] repeat in 2 hours if headache persists or recurs. 10 tablet 1  . ELIQUIS 5 MG TABS tablet TAKE 1 TABLET BY MOUTH TWICE DAILY 60 tablet 6  . fluticasone (CUTIVATE) 0.05 % cream Apply topically 2 (two) times daily. 30 g 2  . fluticasone (FLONASE) 50 MCG/ACT nasal spray PLACE 2 SPRAYS INTO BOTH NOSTRILS DAILY (Patient taking differently: Place 2 sprays into both nostrils 2 (two) times daily. ) 16 g 2  . Fluticasone-Salmeterol (ADVAIR DISKUS) 250-50 MCG/DOSE AEPB Inhale 1 puff into the lungs 2 (two) times daily. 60 each 7  . gabapentin  (NEURONTIN) 100 MG capsule Take 1 capsule (100 mg total) by mouth 3 (three) times daily. 90 capsule 5  . HUMALOG KWIKPEN 100 UNIT/ML KiwkPen Inject 8 Units into the skin 3 (three) times daily.   5  . hydrochlorothiazide (MICROZIDE) 12.5 MG capsule TAKE 1 CAPSULE BY MOUTH ONCE DAILY 30 capsule 2  . LANTUS SOLOSTAR 100 UNIT/ML Solostar Pen Inject 24 Units into the skin daily at 10 pm.   0  . levothyroxine (SYNTHROID, LEVOTHROID) 175 MCG tablet Take 1 tablet (175 mcg total) by mouth daily. 30 tablet 0  . loratadine (CLARITIN) 10 MG tablet Take 1 tablet (10 mg total) by mouth daily. 30 tablet 11  . metFORMIN (GLUCOPHAGE) 850 MG tablet Take 1 tablet (850 mg total) by mouth 2 (two) times daily with a meal. 180 tablet 3  . methocarbamol (ROBAXIN) 500 MG tablet TAKE 1 TABLET BY MOUTH EVERY 8 HOURS AS NEEDED FOR MUSCLE SPASMS 40 tablet 1  . metoprolol tartrate (LOPRESSOR) 50 MG tablet TAKE 1 TABLET BY MOUTH TWICE DAILY 60 tablet 2  . montelukast (SINGULAIR) 10 MG tablet TAKE 1 TABLET BY MOUTH EVERY NIGHT AT BEDTIME 30 tablet 2  . pantoprazole (PROTONIX) 40 MG tablet Take 1 tablet (40 mg total) by mouth daily. 30 tablet 5  . prazosin (MINIPRESS) 1 MG capsule Take 1 mg by mouth at bedtime.    . topiramate (TOPAMAX) 50 MG tablet Take 3 tablets at bedtime for 7 days, then increase to 4 tablets at bedtime.  Contact us for refill 120 tablet 0  . traMADol (ULTRAM) 50 MG tablet Take 1 tablet (50 mg total) by mouth daily as needed for moderate pain or severe pain. 30 tablet 1  . UNIFINE PENTIPS 32G X 4 MM MISC   0  . amitriptyline (ELAVIL) 50 MG tablet Take 50 mg by mouth at bedtime.     No facility-administered medications  prior to visit.        Objective:   Physical Exam Vitals:   04/28/18 1113  BP: 128/82  Pulse: 87  SpO2: 98%  Weight: (!) 317 lb (143.8 kg)  Height: 5\' 9"  (1.753 m)   Gen: Pleasant, obese woman, in no distress,  normal affect  ENT: No lesions,  mouth clear,  oropharynx clear, no  postnasal drip  Neck: No JVD, no stridor  Lungs: No use of accessory muscles, no wheeze or crackles.   Cardiovascular: RRR, heart sounds normal, no murmur or gallops, no peripheral edema  Musculoskeletal: No deformities, no cyanosis or clubbing  Neuro: alert, non focal  Skin: Warm, no lesions or rash       Assessment & Plan:  Mild persistent asthma Mild obstruction confirmed on her pulmonary function testing.  I believe this is best characterized as mild persistent asthma given her every day symptoms.  They are improved, particularly the cough, with the change to Symbicort.  Plan to continue  Chronic cough Upper airway symptoms appear to be improved since last visit.  We made the change from Advair to Symbicort, also added pantoprazole.  I suspect that changing the powdered inhaler by the most impact.  She denies significant GERD symptoms and I think we can stop the pantoprazole.  If she backtracks we could always restart a PPI.  She is on a good allergy regimen, we discussed starting nasal saline washes today although she is not sure that she can tolerate.  Morbid obesity ([place]) At risk for obstructive sleep apnea.  She is discussed this with her primary psychiatrist and a sleep study has been ordered.  I can review this with her when it is performed.  Baltazar Apo, MD, PhD 04/28/2018, 11:43 AM  Pulmonary and Critical Care [phone] or if no answer [phone]

## 2018-04-28 NOTE — Patient Instructions (Signed)
We will continue Symbicort 2 puffs twice a day. Please rinse and gargle after using.  Keep albuterol available to use 2 puffs as needed for shortness of breath, wheezing, chest tightness. Continue Singulair, loratadine, fluticasone nasal spray as you have been taking them. Please stop pantoprazole.  Call us if your cough increases off this medication. Flu shot today Agree with sleep study as has been recommended by your other physicians.  We can follow-up the results with you when available Follow with Dr Lamonte Sakai in 6 months or sooner if you have any problems

## 2018-04-28 NOTE — Assessment & Plan Note (Signed)
At risk for obstructive sleep apnea.  She is discussed this with her primary psychiatrist and a sleep study has been ordered.  I can review this with her when it is performed.

## 2018-04-28 NOTE — Assessment & Plan Note (Signed)
Mild obstruction confirmed on her pulmonary function testing.  I believe this is best characterized as mild persistent asthma given her every day symptoms.  They are improved, particularly the cough, with the change to Symbicort.  Plan to continue

## 2018-04-28 NOTE — Assessment & Plan Note (Signed)
Upper airway symptoms appear to be improved since last visit.  We made the change from Advair to Symbicort, also added pantoprazole.  I suspect that changing the powdered inhaler by the most impact.  She denies significant GERD symptoms and I think we can stop the pantoprazole.  If she backtracks we could always restart a PPI.  She is on a good allergy regimen, we discussed starting nasal saline washes today although she is not sure that she can tolerate.

## 2018-04-29 DIAGNOSIS — F411 Generalized anxiety disorder: Secondary | ICD-10-CM | POA: Diagnosis not present

## 2018-05-01 DIAGNOSIS — E1165 Type 2 diabetes mellitus with hyperglycemia: Secondary | ICD-10-CM | POA: Diagnosis not present

## 2018-05-06 DIAGNOSIS — F411 Generalized anxiety disorder: Secondary | ICD-10-CM | POA: Diagnosis not present

## 2018-05-06 DIAGNOSIS — Z7689 Persons encountering health services in other specified circumstances: Secondary | ICD-10-CM | POA: Diagnosis not present

## 2018-05-07 ENCOUNTER — Other Ambulatory Visit: Payer: Self-pay | Admitting: Internal Medicine

## 2018-05-07 ENCOUNTER — Telehealth: Payer: Self-pay | Admitting: Neurology

## 2018-05-07 DIAGNOSIS — Z794 Long term (current) use of insulin: Secondary | ICD-10-CM

## 2018-05-07 DIAGNOSIS — E118 Type 2 diabetes mellitus with unspecified complications: Secondary | ICD-10-CM

## 2018-05-07 DIAGNOSIS — G8929 Other chronic pain: Secondary | ICD-10-CM

## 2018-05-07 DIAGNOSIS — M545 Low back pain, unspecified: Secondary | ICD-10-CM

## 2018-05-07 DIAGNOSIS — J4550 Severe persistent asthma, uncomplicated: Secondary | ICD-10-CM

## 2018-05-07 MED FILL — buPROPion HCL ER (XL) 300 M: 300 | 30 days supply | Qty: 30 | Fill #2

## 2018-05-07 MED FILL — ELIQUIS 5 MG TABLET: 5 | 30 days supply | Qty: 60 | Fill #6

## 2018-05-07 MED FILL — CITALOPRAM HBR 40 MG TABLET: 40 | 30 days supply | Qty: 30 | Fill #0

## 2018-05-07 MED FILL — HYDROCHLOROTHIAZIDE 12.5 MG: 12.5 | 30 days supply | Qty: 30 | Fill #2

## 2018-05-07 MED FILL — LORATADINE 10 MG TABS: 10 | 30 days supply | Qty: 30 | Fill #5

## 2018-05-07 MED FILL — PRAZOSIN 1 MG CAPSULE: 1 | 30 days supply | Qty: 30 | Fill #1

## 2018-05-07 MED FILL — GABAPENTIN 100 MG CAPSULE: 100 | 30 days supply | Qty: 90 | Fill #1

## 2018-05-07 NOTE — Telephone Encounter (Signed)
Patient is calling in stating that the Topiramate 200mg  medication is not working for her headaches. Please call her back at [phone]. Thanks!

## 2018-05-08 ENCOUNTER — Other Ambulatory Visit: Payer: Self-pay

## 2018-05-08 DIAGNOSIS — G43709 Chronic migraine without aura, not intractable, without status migrainosus: Secondary | ICD-10-CM

## 2018-05-08 MED ORDER — FREMANEZUMAB-VFRM 225 MG/1.5ML ~~LOC~~ SOSY: 225.0000 mg | PREFILLED_SYRINGE | SUBCUTANEOUS | 11 refills | Status: DC

## 2018-05-08 MED FILL — METOPROLOL TARTRATE 50 MG T: 50 | 30 days supply | Qty: 60 | Fill #0

## 2018-05-08 MED FILL — FLUTICASONE PROP 50 MCG SPR: 50 | 30 days supply | Qty: 16 | Fill #0

## 2018-05-08 MED FILL — ATORVASTATIN CALCIUM 20 MG: 20 | 30 days supply | Qty: 30 | Fill #0

## 2018-05-08 MED FILL — MONTELUKAST SOD 10 MG TAB: 10 | 30 days supply | Qty: 30 | Fill #0

## 2018-05-08 MED FILL — traZODone HCL 100 MG TABS: 100 | 30 days supply | Qty: 60 | Fill #0

## 2018-05-08 MED FILL — METHOCARBAMOL 500 MG TABLET: 500 | 13 days supply | Qty: 40 | Fill #0

## 2018-05-08 NOTE — Telephone Encounter (Signed)
Called and spoke with Pt. She mentioned Botox, then said she would like to try the monthly injection. I advised her I will submit the authorization to [place] for one of the anti CGRPs

## 2018-05-13 DIAGNOSIS — F411 Generalized anxiety disorder: Secondary | ICD-10-CM | POA: Diagnosis not present

## 2018-05-13 DIAGNOSIS — Z7689 Persons encountering health services in other specified circumstances: Secondary | ICD-10-CM | POA: Diagnosis not present

## 2018-05-13 MED FILL — SYNTHROID 175 MCG TABLET: 175 | 30 days supply | Qty: 30 | Fill #3

## 2018-05-18 DIAGNOSIS — Z85850 Personal history of malignant neoplasm of thyroid: Secondary | ICD-10-CM | POA: Diagnosis not present

## 2018-05-18 DIAGNOSIS — E119 Type 2 diabetes mellitus without complications: Secondary | ICD-10-CM | POA: Diagnosis not present

## 2018-05-18 DIAGNOSIS — E890 Postprocedural hypothyroidism: Secondary | ICD-10-CM | POA: Diagnosis not present

## 2018-05-18 DIAGNOSIS — Z794 Long term (current) use of insulin: Secondary | ICD-10-CM | POA: Diagnosis not present

## 2018-05-18 DIAGNOSIS — Z5181 Encounter for therapeutic drug level monitoring: Secondary | ICD-10-CM | POA: Diagnosis not present

## 2018-05-20 DIAGNOSIS — F411 Generalized anxiety disorder: Secondary | ICD-10-CM | POA: Diagnosis not present

## 2018-05-27 ENCOUNTER — Ambulatory Visit: Payer: Medicaid Other | Attending: Internal Medicine | Admitting: Physician Assistant

## 2018-05-27 VITALS — BP 124/83 | HR 84 | Temp 98.3°F | Resp 18 | Ht 69.0 in | Wt 308.0 lb

## 2018-05-27 DIAGNOSIS — Z91013 Allergy to seafood: Secondary | ICD-10-CM | POA: Insufficient documentation

## 2018-05-27 DIAGNOSIS — M545 Low back pain: Secondary | ICD-10-CM | POA: Diagnosis not present

## 2018-05-27 DIAGNOSIS — E118 Type 2 diabetes mellitus with unspecified complications: Secondary | ICD-10-CM

## 2018-05-27 DIAGNOSIS — M1990 Unspecified osteoarthritis, unspecified site: Secondary | ICD-10-CM | POA: Diagnosis not present

## 2018-05-27 DIAGNOSIS — I10 Essential (primary) hypertension: Secondary | ICD-10-CM | POA: Insufficient documentation

## 2018-05-27 DIAGNOSIS — Z888 Allergy status to other drugs, medicaments and biological substances status: Secondary | ICD-10-CM | POA: Diagnosis not present

## 2018-05-27 DIAGNOSIS — Z794 Long term (current) use of insulin: Secondary | ICD-10-CM | POA: Insufficient documentation

## 2018-05-27 DIAGNOSIS — Z79899 Other long term (current) drug therapy: Secondary | ICD-10-CM | POA: Diagnosis not present

## 2018-05-27 DIAGNOSIS — Z880 Allergy status to penicillin: Secondary | ICD-10-CM | POA: Insufficient documentation

## 2018-05-27 DIAGNOSIS — Z91048 Other nonmedicinal substance allergy status: Secondary | ICD-10-CM | POA: Diagnosis not present

## 2018-05-27 DIAGNOSIS — Z91018 Allergy to other foods: Secondary | ICD-10-CM | POA: Diagnosis not present

## 2018-05-27 DIAGNOSIS — E1142 Type 2 diabetes mellitus with diabetic polyneuropathy: Secondary | ICD-10-CM | POA: Insufficient documentation

## 2018-05-27 DIAGNOSIS — R531 Weakness: Secondary | ICD-10-CM | POA: Diagnosis not present

## 2018-05-27 DIAGNOSIS — Z79890 Hormone replacement therapy (postmenopausal): Secondary | ICD-10-CM | POA: Insufficient documentation

## 2018-05-27 DIAGNOSIS — R29898 Other symptoms and signs involving the musculoskeletal system: Secondary | ICD-10-CM

## 2018-05-27 DIAGNOSIS — F909 Attention-deficit hyperactivity disorder, unspecified type: Secondary | ICD-10-CM | POA: Insufficient documentation

## 2018-05-27 DIAGNOSIS — G8929 Other chronic pain: Secondary | ICD-10-CM | POA: Insufficient documentation

## 2018-05-27 DIAGNOSIS — J45909 Unspecified asthma, uncomplicated: Secondary | ICD-10-CM | POA: Diagnosis not present

## 2018-05-27 LAB — POCT GLYCOSYLATED HEMOGLOBIN (HGB A1C): HEMOGLOBIN A1C: 5.3 % (ref 4.0–5.6)

## 2018-05-27 LAB — GLUCOSE, POCT (MANUAL RESULT ENTRY): POC Glucose: 151 mg/dl — AB (ref 70–99)

## 2018-05-27 MED ORDER — TRAMADOL HCL 50 MG PO TABS: 50.0000 mg | ORAL_TABLET | Freq: Every day | ORAL | 1 refills | Status: DC | PRN

## 2018-05-27 MED FILL — traMADol HCL 50 MG TABS: 50 | 7 days supply | Qty: 7 | Fill #1

## 2018-05-27 NOTE — Progress Notes (Signed)
Patient ID: Melinda [name], female   DOB: 18-Aug-1965, 52 y.o.   MRN: [id number]       Melinda [name], is a 52 y.o. female  WUJ:[id number]  [id number]  DOB - 03/17/1966  Subjective:  Chief Complaint and HPI: Melinda [name] is a 52 y.o. female here today  Legs are feeling weak for several months(at least since [name]).  This is getting progressively worse and at times, she feels she is going to fall.  She says extensive blood work was done at Dr. Cindra Eves office last month, but she does not have copies of this, and I don't have access to it. +paresthesias R leg(this has been going on for years).  No new weakness in upper body.  She saw her neurologist last month but did not mention the weakness.    She has intentionally lost ~80 pounds this year and the weakness has not improved.   ROS:   Constitutional:  No f/c, No night sweats, No unexplained weight loss. EENT:  No vision changes, No blurry vision, No hearing changes. No mouth, throat, or ear problems.  Respiratory: No cough, No SOB Cardiac: No CP, no palpitations GI:  No abd pain, No N/V/D. GU: No Urinary s/sx Musculoskeletal: No joint pain Neuro: No headache, no dizziness, + motor weakness.  Skin: No rash Endocrine:  No polydipsia. No polyuria.  Psych: Denies SI/HI  No problems updated.  ALLERGIES: Allergies  Allergen Reactions  . Ceftriaxone Anaphylaxis    ROCEPHIN  . Penicillins Shortness Of Breath    Has patient had a PCN reaction causing immediate rash, facial/tongue/throat swelling, SOB or lightheadedness with hypotension: Yes Has patient had a PCN reaction causing severe rash involving mucus membranes or skin necrosis: No Has patient had a PCN reaction that required hospitalization No Has patient had a PCN reaction occurring within the last 10 years: No If all of the above answers are "NO", then [name] proceed with Cephalosporin use.   . Shellfish Allergy Anaphylaxis  . Gold-Containing Drug Products     HANDS ITCH  . Nickel       HANDS SWELL  . Citrus Rash    PAST MEDICAL HISTORY: Past Medical History:  Diagnosis Date  . ADHD (attention deficit hyperactivity disorder)   . Arthritis   . Asthma   . Cancer ([place])   . Chicken pox AGE 89  . Diabetes mellitus without complication ([place])   . GERD (gastroesophageal reflux disease)   . Glaucoma    BOTH EYES, NO EYE DROPS  . Glaucoma   . History of blood transfusion MARCH 2016    2UNITS GIVEN AND IRON GIVEN  . Hyperglycemia 09/09/2014  . Hypertension   . 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 Iron deficiency anemia due to chronic blood loss 09/09/2014  . Low TSH level 09/08/2014  . Lumbar herniated disc   . Menorrhagia 09/08/2014  . Migraine    CLUSTER AND MIGRAINES  . Multiple thyroid nodules   . Ovarian mass, right 09/09/2014  . PE (pulmonary embolism) 2016  . Peripheral neuropathy    FINGER TIPS AND TOES NUMB SOME  . Preeclampsia  1983   WITH PREGNANCY  . PTSD (post-traumatic stress disorder)   . Scoliosis   . Seizures ([place]) AGE 89   NONE SINCE, HAD CHICKEN POX THEN    MEDICATIONS AT HOME: Prior to Admission medications   Medication Sig Start Date End Date Taking?  Authorizing Provider  ACCU-CHEK AVIVA PLUS test strip USE TO TEST BLOOD GLUCOSE FOUR TIMES DAILY BEFORE MEALS AND AT BEDTIME 11/06/15  Yes Funches, Adriana Mccallum, MD  ACCU-CHEK SOFTCLIX LANCETS lancets  10/20/17  Yes [provider]  albuterol (PROVENTIL) (2.5 MG/3ML) 0.083% nebulizer solution TAKE 3 MLS (2.5 MG TOTAL) BY NEBULIZATION EVERY 6 HOURS AS NEEDED FORWHEEZING OR SHORTNESS OF BREATH Patient taking differently: Take 2.5 mg by nebulization every 6 (six) hours as needed for wheezing or shortness of breath.  05/13/17  Yes [name], MD  atorvastatin (LIPITOR) 20 MG tablet TAKE 1 TABLET BY MOUTH ONCE DAILY 05/08/18  Yes [name], MD  budesonide-formoterol [facility]) 160-4.5 MCG/ACT inhaler Inhale 2  puffs into the lungs 2 (two) times daily. 03/12/18  Yes Collene Gobble, MD  buPROPion (WELLBUTRIN XL) 300 MG 24 hr tablet Take 300 mg by mouth daily.  09/12/17  Yes [provider]  busPIRone (BUSPAR) 5 MG tablet Take 5 mg by mouth 3 (three) times daily.   Yes [provider]  citalopram (CELEXA) 40 MG tablet Take 40 mg by mouth daily.  12/01/17  Yes [provider]  ELIQUIS 5 MG TABS tablet TAKE 1 TABLET BY MOUTH TWICE DAILY 11/06/17  Yes [name], MD  fluticasone (CUTIVATE) 0.05 % cream Apply topically 2 (two) times daily. 05/13/16  Yes Funches, Adriana Mccallum, MD  fluticasone (FLONASE) 50 MCG/ACT nasal spray INSTILL 2 SPRAYS INTO BOTH NOSTRILS DAILY 05/08/18  Yes [name], MD  gabapentin (NEURONTIN) 100 MG capsule Take 1 capsule (100 mg total) by mouth 3 (three) times daily. 02/26/18  Yes [name], MD  hydrochlorothiazide (MICROZIDE) 12.5 MG capsule TAKE 1 CAPSULE BY MOUTH ONCE DAILY 02/25/18  Yes [name], MD  levothyroxine (SYNTHROID, LEVOTHROID) 175 MCG tablet Take 1 tablet (175 mcg total) by mouth daily. 02/02/18  Yes [name], MD  loratadine (CLARITIN) 10 MG tablet Take 1 tablet (10 mg total) by mouth daily. 10/31/17  Yes [name], MD  metFORMIN (GLUCOPHAGE) 850 MG tablet Take 1 tablet (850 mg total) by mouth 2 (two) times daily with a meal. 08/08/17  Yes [name], MD  methocarbamol (ROBAXIN) 500 MG tablet TAKE 1 TABLET BY MOUTH EVERY 8 HOURS AS NEEDED FOR MUSCLE SPASMS 05/08/18  Yes [name], MD  metoprolol tartrate (LOPRESSOR) 50 MG tablet TAKE 1 TABLET BY MOUTH TWICE DAILY 05/08/18  Yes [name], MD  montelukast (SINGULAIR) 10 MG tablet TAKE 1 TABLET BY MOUTH EVERY NIGHT AT BEDTIME 05/08/18  Yes [name], MD  prazosin (MINIPRESS) 1 MG capsule Take 1 mg by mouth at bedtime.   Yes [provider]  topiramate (TOPAMAX) 50 MG tablet Take 3 tablets at bedtime for 7 days, then increase  to 4 tablets at bedtime.  Contact us for refill 04/13/18  Yes Jaffe, Adam R, DO  traMADol (ULTRAM) 50 MG tablet Take 1 tablet (50 mg total) by mouth daily as needed for moderate pain or severe pain. 05/27/18  Yes McClung, Rehmat Murtagh, PA-C  UNIFINE PENTIPS 32G X 4 MM MISC  10/20/17  Yes [provider]  eletriptan (RELPAX) 40 MG tablet Take 1 tablet (40 mg total) by mouth as needed for migraine or headache. [name] repeat in 2 hours if headache persists or recurs. Patient not taking: Reported on 05/27/2018 04/13/18   [name], DO  Fluticasone-Salmeterol (ADVAIR DISKUS) 250-50 MCG/DOSE AEPB Inhale 1 puff into the lungs 2 (  two) times daily. Patient not taking: Reported on 05/27/2018 10/31/17   [name], MD  Fremanezumab-vfrm (AJOVY) 225 MG/1.5ML SOSY Inject 225 mg into the skin every 30 (thirty) days. Patient not taking: Reported on 05/27/2018 05/08/18   [name], DO  HUMALOG KWIKPEN 100 UNIT/ML KiwkPen Inject 8 Units into the skin 3 (three) times daily.  12/25/17   [provider]  LANTUS SOLOSTAR 100 UNIT/ML Solostar Pen Inject 24 Units into the skin daily at 10 pm.  09/02/17   [provider]  pantoprazole (PROTONIX) 40 MG tablet Take 1 tablet (40 mg total) by mouth daily. Patient not taking: Reported on 05/27/2018 03/12/18   Collene Gobble, MD     Objective:  EXAM:   Vitals:   05/27/18 0854  BP: 124/83  Pulse: 84  Resp: 18  Temp: 98.3 F (36.8 C)  TempSrc: Oral  SpO2: 96%  Weight: (!) 308 lb (139.7 kg)  Height: 5\' 9"  (1.753 m)    General appearance : A&OX3. NAD. Non-toxic-appearing.  Obese.  Unsteady gait.  Wheel chair used to assist.  Difficult to obtain history. HEENT: Atraumatic and Normocephalic.  PERRLA. EOM intact.  Neck: supple, no JVD. No cervical lymphadenopathy. No thyromegaly Chest/Lungs:  Breathing-non-labored, Good air entry bilaterally, breath sounds normal without rales, rhonchi, or wheezing  CVS: S1 S2 regular, no murmurs, gallops,  rubs  Extremities: U&L extremities with 4/5 Strength B.  Bilateral Lower Ext shows no edema, both legs are warm to touch with = pulse throughout Neurology:  CN II-XII grossly intact, Non focal.   Psych:  TP scattered. J/I fair. Normal speech. Appropriate eye contact and blunted affect.  Skin:  No Rash  Data Review Lab Results  Component Value Date   HGBA1C 5.6 10/31/2017   HGBA1C 6.0 05/06/2017   HGBA1C 5.7 10/14/2016     Assessment & Plan   1. Weakness of both lower extremities Obtain all of your lab results within the last 6 months from Dr Buddy Duty.  Make an appointment with Dr. [name] for your leg weakness.  If you worsen before you can make an appointment, go to the emergency department.  Rx for walker given.  Patient declined bloodwork today starting that Dr Buddy Duty just did extensive labs that were all normal.   Spent more than 25 mins face to face reviewing notes and labs with patient.     2. Controlled type 2 diabetes mellitus with complication, with long-term current use of insulin (HCC)=5.3 today Controlled-continue with great work on weight loss and healthy diet. - Glucose (CBG) - POCT glycosylated hemoglobin (Hb A1C)  3. Chronic bilateral low back pain without sciatica - traMADol (ULTRAM) 50 MG tablet; Take 1 tablet (50 mg total) by mouth daily as needed for moderate pain or severe pain.  Dispense: 30 tablet; Refill: 1     Patient have been counseled extensively about nutrition and exercise  Return in about 3 months (around 08/27/2018) for Dr Wynetta Emery.  The patient was given clear instructions to go to ER or return to medical center if symptoms don't improve, worsen or new problems develop. The patient verbalized understanding. The patient was told to call to get lab results if they haven't heard anything in the next week.     Freeman Caldron, PA-C [facility] and [facility] [place], [place]   05/27/2018, 9:15 AM

## 2018-05-27 NOTE — Patient Instructions (Signed)
Eliminate sugars from your diet.  Great work on your weight loss.    Obtain all of your lab results within the last 6 months from Dr Buddy Duty.  Make an appointment with Dr. [name] for your leg weakness.  If you worsen before you can make an appointment, go to the emergency department.

## 2018-06-03 DIAGNOSIS — F411 Generalized anxiety disorder: Secondary | ICD-10-CM | POA: Diagnosis not present

## 2018-06-04 MED FILL — metFORMIN HCL 850 MG TABS: 850 | 90 days supply | Qty: 180 | Fill #3

## 2018-06-04 MED FILL — LORATADINE 10 MG TABS: 10 | 30 days supply | Qty: 30 | Fill #6

## 2018-06-04 MED FILL — METHOCARBAMOL 500 MG TABLET: 500 | 13 days supply | Qty: 40 | Fill #1

## 2018-06-05 ENCOUNTER — Other Ambulatory Visit: Payer: Self-pay

## 2018-06-05 DIAGNOSIS — E1165 Type 2 diabetes mellitus with hyperglycemia: Secondary | ICD-10-CM | POA: Diagnosis not present

## 2018-06-06 MED ORDER — APIXABAN 5 MG PO TABS: 5.0000 mg | ORAL_TABLET | Freq: Two times a day (BID) | ORAL | 6 refills | Status: DC

## 2018-06-08 MED FILL — ELIQUIS 5 MG TABLET: 5 | 30 days supply | Qty: 60 | Fill #0

## 2018-06-10 ENCOUNTER — Other Ambulatory Visit: Payer: Self-pay | Admitting: Internal Medicine

## 2018-06-10 ENCOUNTER — Other Ambulatory Visit: Payer: Self-pay | Admitting: Neurology

## 2018-06-10 DIAGNOSIS — H01024 Squamous blepharitis left upper eyelid: Secondary | ICD-10-CM | POA: Diagnosis not present

## 2018-06-10 DIAGNOSIS — F411 Generalized anxiety disorder: Secondary | ICD-10-CM | POA: Diagnosis not present

## 2018-06-10 DIAGNOSIS — H31092 Other chorioretinal scars, left eye: Secondary | ICD-10-CM | POA: Diagnosis not present

## 2018-06-10 DIAGNOSIS — H2513 Age-related nuclear cataract, bilateral: Secondary | ICD-10-CM | POA: Diagnosis not present

## 2018-06-10 DIAGNOSIS — I10 Essential (primary) hypertension: Secondary | ICD-10-CM

## 2018-06-10 DIAGNOSIS — H40013 Open angle with borderline findings, low risk, bilateral: Secondary | ICD-10-CM | POA: Diagnosis not present

## 2018-06-10 DIAGNOSIS — H01025 Squamous blepharitis left lower eyelid: Secondary | ICD-10-CM | POA: Diagnosis not present

## 2018-06-10 DIAGNOSIS — H01022 Squamous blepharitis right lower eyelid: Secondary | ICD-10-CM | POA: Diagnosis not present

## 2018-06-10 DIAGNOSIS — E119 Type 2 diabetes mellitus without complications: Secondary | ICD-10-CM | POA: Diagnosis not present

## 2018-06-10 DIAGNOSIS — H01021 Squamous blepharitis right upper eyelid: Secondary | ICD-10-CM | POA: Diagnosis not present

## 2018-06-10 DIAGNOSIS — H05243 Constant exophthalmos, bilateral: Secondary | ICD-10-CM | POA: Diagnosis not present

## 2018-06-10 DIAGNOSIS — H04123 Dry eye syndrome of bilateral lacrimal glands: Secondary | ICD-10-CM | POA: Diagnosis not present

## 2018-06-10 MED FILL — ATORVASTATIN CALCIUM 20 MG: 20 | 30 days supply | Qty: 30 | Fill #1

## 2018-06-10 MED FILL — MONTELUKAST SOD 10 MG TAB: 10 | 30 days supply | Qty: 30 | Fill #1

## 2018-06-10 MED FILL — CITALOPRAM HBR 40 MG TABLET: 40 | 30 days supply | Qty: 30 | Fill #1

## 2018-06-10 MED FILL — HYDROCHLOROTHIAZIDE 12.5 MG: 12.5 | 30 days supply | Qty: 30 | Fill #0

## 2018-06-10 MED FILL — SYNTHROID 175 MCG TABLET: 175 | 30 days supply | Qty: 30 | Fill #4

## 2018-06-10 MED FILL — METOPROLOL TARTRATE 50 MG T: 50 | 30 days supply | Qty: 60 | Fill #1

## 2018-06-11 NOTE — Telephone Encounter (Signed)
LMOVM for Pt to return my call. Need to know if still on topiramate, and also if has rcvd rizatriptan

## 2018-06-16 ENCOUNTER — Telehealth: Payer: Self-pay | Admitting: Neurology

## 2018-06-16 MED ORDER — TOPIRAMATE 200 MG PO TABS: 200.0000 mg | ORAL_TABLET | Freq: Every day | ORAL | 3 refills | Status: DC

## 2018-06-16 MED FILL — TOPIRAMATE 200 MG TABLET: 200 | 30 days supply | Qty: 30 | Fill #0

## 2018-06-16 NOTE — Telephone Encounter (Signed)
Patient has appt in Feb and needs her topiramate called in to the [facility] .

## 2018-06-19 DIAGNOSIS — F331 Major depressive disorder, recurrent, moderate: Secondary | ICD-10-CM | POA: Diagnosis not present

## 2018-06-19 DIAGNOSIS — F4310 Post-traumatic stress disorder, unspecified: Secondary | ICD-10-CM | POA: Diagnosis not present

## 2018-06-19 DIAGNOSIS — F411 Generalized anxiety disorder: Secondary | ICD-10-CM | POA: Diagnosis not present

## 2018-06-19 MED FILL — DOXEPIN 25 MG CAPSULE: 25 | 30 days supply | Qty: 30 | Fill #0

## 2018-06-19 MED FILL — busPIRone HCL 5 MG TABS: 5 | 30 days supply | Qty: 60 | Fill #0

## 2018-06-19 MED FILL — buPROPion HCL ER (XL) 300 M: 300 | 30 days supply | Qty: 30 | Fill #0

## 2018-07-03 ENCOUNTER — Other Ambulatory Visit: Payer: Self-pay | Admitting: Internal Medicine

## 2018-07-03 DIAGNOSIS — E1165 Type 2 diabetes mellitus with hyperglycemia: Secondary | ICD-10-CM | POA: Diagnosis not present

## 2018-07-03 DIAGNOSIS — G8929 Other chronic pain: Secondary | ICD-10-CM

## 2018-07-03 DIAGNOSIS — M545 Low back pain: Principal | ICD-10-CM

## 2018-07-03 MED FILL — GABAPENTIN 100 MG CAPSULE: 100 | 30 days supply | Qty: 90 | Fill #2

## 2018-07-03 MED FILL — CITALOPRAM HBR 40 MG TABLET: 40 | 30 days supply | Qty: 30 | Fill #0

## 2018-07-03 MED FILL — HYDROCHLOROTHIAZIDE 12.5 MG: 12.5 | 30 days supply | Qty: 30 | Fill #1

## 2018-07-03 MED FILL — ACCU-CHEK SOFTCLIX LANCETS: 25 days supply | Qty: 100 | Fill #3

## 2018-07-03 MED FILL — ATORVASTATIN CALCIUM 20 MG: 20 | 30 days supply | Qty: 30 | Fill #2

## 2018-07-03 MED FILL — PRAZOSIN 1 MG CAPSULE: 1 | 30 days supply | Qty: 30 | Fill #2

## 2018-07-03 MED FILL — SYNTHROID 175 MCG TABLET: 175 | 30 days supply | Qty: 30 | Fill #5

## 2018-07-03 MED FILL — busPIRone HCL 5 MG TABS: 5 | 30 days supply | Qty: 60 | Fill #0

## 2018-07-03 MED FILL — ELIQUIS 5 MG TABLET: 5 | 30 days supply | Qty: 60 | Fill #1

## 2018-07-03 MED FILL — METOPROLOL TARTRATE 50 MG T: 50 | 30 days supply | Qty: 60 | Fill #2

## 2018-07-03 MED FILL — buPROPion HCL ER (XL) 300 M: 300 | 30 days supply | Qty: 30 | Fill #0

## 2018-07-03 MED FILL — LORATADINE 10 MG TABS: 10 | 30 days supply | Qty: 30 | Fill #7

## 2018-07-03 MED FILL — MONTELUKAST SOD 10 MG TAB: 10 | 30 days supply | Qty: 30 | Fill #2

## 2018-07-06 MED FILL — METHOCARBAMOL 500 MG TABLET: 500 | 14 days supply | Qty: 40 | Fill #0

## 2018-07-08 ENCOUNTER — Emergency Department (HOSPITAL_COMMUNITY)
Admission: EM | Admit: 2018-07-08 | Discharge: 2018-07-09 | Disposition: A | Discharge status: Home / Self Care | Payer: Medicaid Other | Attending: Emergency Medicine | Admitting: Emergency Medicine

## 2018-07-08 ENCOUNTER — Emergency Department (HOSPITAL_COMMUNITY): Payer: Medicaid Other

## 2018-07-08 ENCOUNTER — Encounter (HOSPITAL_COMMUNITY): Payer: Self-pay

## 2018-07-08 ENCOUNTER — Other Ambulatory Visit: Payer: Self-pay

## 2018-07-08 DIAGNOSIS — Y92009 Unspecified place in unspecified non-institutional (private) residence as the place of occurrence of the external cause: Secondary | ICD-10-CM | POA: Insufficient documentation

## 2018-07-08 DIAGNOSIS — Y998 Other external cause status: Secondary | ICD-10-CM | POA: Diagnosis not present

## 2018-07-08 DIAGNOSIS — Z794 Long term (current) use of insulin: Secondary | ICD-10-CM | POA: Diagnosis not present

## 2018-07-08 DIAGNOSIS — R51 Headache: Secondary | ICD-10-CM | POA: Insufficient documentation

## 2018-07-08 DIAGNOSIS — Z7689 Persons encountering health services in other specified circumstances: Secondary | ICD-10-CM | POA: Diagnosis not present

## 2018-07-08 DIAGNOSIS — W19XXXA Unspecified fall, initial encounter: Secondary | ICD-10-CM

## 2018-07-08 DIAGNOSIS — Z86718 Personal history of other venous thrombosis and embolism: Secondary | ICD-10-CM | POA: Diagnosis not present

## 2018-07-08 DIAGNOSIS — S8002XA Contusion of left knee, initial encounter: Secondary | ICD-10-CM | POA: Diagnosis not present

## 2018-07-08 DIAGNOSIS — S59902A Unspecified injury of left elbow, initial encounter: Secondary | ICD-10-CM | POA: Diagnosis not present

## 2018-07-08 DIAGNOSIS — M545 Low back pain: Secondary | ICD-10-CM | POA: Diagnosis not present

## 2018-07-08 DIAGNOSIS — E1140 Type 2 diabetes mellitus with diabetic neuropathy, unspecified: Secondary | ICD-10-CM | POA: Insufficient documentation

## 2018-07-08 DIAGNOSIS — Z7901 Long term (current) use of anticoagulants: Secondary | ICD-10-CM | POA: Insufficient documentation

## 2018-07-08 DIAGNOSIS — R52 Pain, unspecified: Secondary | ICD-10-CM | POA: Diagnosis not present

## 2018-07-08 DIAGNOSIS — M25522 Pain in left elbow: Secondary | ICD-10-CM | POA: Diagnosis not present

## 2018-07-08 DIAGNOSIS — Y93E2 Activity, laundry: Secondary | ICD-10-CM | POA: Insufficient documentation

## 2018-07-08 DIAGNOSIS — Z87891 Personal history of nicotine dependence: Secondary | ICD-10-CM | POA: Insufficient documentation

## 2018-07-08 DIAGNOSIS — J4530 Mild persistent asthma, uncomplicated: Secondary | ICD-10-CM | POA: Insufficient documentation

## 2018-07-08 DIAGNOSIS — Z79899 Other long term (current) drug therapy: Secondary | ICD-10-CM | POA: Diagnosis not present

## 2018-07-08 DIAGNOSIS — I10 Essential (primary) hypertension: Secondary | ICD-10-CM | POA: Insufficient documentation

## 2018-07-08 DIAGNOSIS — S3992XA Unspecified injury of lower back, initial encounter: Secondary | ICD-10-CM | POA: Diagnosis not present

## 2018-07-08 DIAGNOSIS — R110 Nausea: Secondary | ICD-10-CM | POA: Diagnosis not present

## 2018-07-08 DIAGNOSIS — S79911A Unspecified injury of right hip, initial encounter: Secondary | ICD-10-CM | POA: Diagnosis not present

## 2018-07-08 DIAGNOSIS — S8992XA Unspecified injury of left lower leg, initial encounter: Secondary | ICD-10-CM | POA: Diagnosis not present

## 2018-07-08 DIAGNOSIS — S199XXA Unspecified injury of neck, initial encounter: Secondary | ICD-10-CM | POA: Diagnosis not present

## 2018-07-08 DIAGNOSIS — S0990XA Unspecified injury of head, initial encounter: Secondary | ICD-10-CM | POA: Diagnosis not present

## 2018-07-08 DIAGNOSIS — W109XXA Fall (on) (from) unspecified stairs and steps, initial encounter: Secondary | ICD-10-CM | POA: Insufficient documentation

## 2018-07-08 DIAGNOSIS — S5002XA Contusion of left elbow, initial encounter: Secondary | ICD-10-CM | POA: Diagnosis not present

## 2018-07-08 DIAGNOSIS — R531 Weakness: Secondary | ICD-10-CM | POA: Diagnosis not present

## 2018-07-08 MED ORDER — MORPHINE SULFATE (PF) 4 MG/ML IV SOLN
4.0000 mg | Freq: Once | INTRAVENOUS | Status: AC
  Administered 2018-07-08: 4 mg via INTRAVENOUS
  Filled 2018-07-08: qty 1

## 2018-07-08 NOTE — ED Provider Notes (Signed)
[facility] EMERGENCY DEPARTMENT Provider Note   CSN: [id number] Arrival date & time: 07/08/18  2128     History   Chief Complaint Chief Complaint  Patient presents with  . Fall    HPI Melinda [name] is a 53 y.o. female.  The history is provided by the patient and medical records.  Fall  Associated symptoms include headaches.     53 y.o. F with hx of ADHD, arthritis, DM, GERD, glaucoma, anemia, hx of PE on eliquis, neuropathy, history of SCI during childhood, PTSD, presenting to the ED following a fall at home.  States she was putting away laundry and she missed a step causing her to fall.  Patient states she went down onto her backside, struck left elbow in the ground, twisted left knee, and then hit right hip.  States she does not recall hitting her head or experiencing LOC.  States she called EMS while sitting on the floor, was not able to get up on her own.  States now she does have headache but denies dizziness, numbness, weakness, confusion, blurred vision, etc.  She is anticoagulated and reports history of brain aneurysm.  She has not had any medications PTA.  Past Medical History:  Diagnosis Date  . ADHD (attention deficit hyperactivity disorder)   . Arthritis   . Asthma   . Cancer ([place])   . Chicken pox AGE 81  . Diabetes mellitus without complication ([place])   . GERD (gastroesophageal reflux disease)   . Glaucoma    BOTH EYES, NO EYE DROPS  . Glaucoma   . History of blood transfusion MARCH 2016    2UNITS GIVEN AND IRON GIVEN  . Hyperglycemia 09/09/2014  . Hypertension   . 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 Iron deficiency anemia due to chronic blood loss 09/09/2014  . Low TSH level 09/08/2014  . Lumbar herniated disc   . Menorrhagia 09/08/2014  . Migraine    CLUSTER AND MIGRAINES  . Multiple thyroid nodules   . Ovarian mass, right 09/09/2014  . PE (pulmonary embolism) 2016    . Peripheral neuropathy    FINGER TIPS AND TOES NUMB SOME  . Preeclampsia  1983   WITH PREGNANCY  . PTSD (post-traumatic stress disorder)   . Scoliosis   . Seizures ([place]) AGE 58   NONE SINCE, HAD CHICKEN POX THEN    Patient Active Problem List   Diagnosis Date Noted  . Chronic cough 03/12/2018  . Left shoulder pain 10/14/2016  . Hypertension 10/14/2016  . S/P thyroidectomy 06/27/2016  . Chronic bilateral low back pain without sciatica 04/22/2016  . Liver lesion 12/24/2015  . Papillary thyroid carcinoma ([place]) 12/21/2015  . Controlled type 2 diabetes mellitus with complication, with long-term current use of insulin ([place]) 10/10/2015  . Right leg DVT ([place]) 10/10/2015  . Vision loss 08/25/2015  . Decreased vision in both eyes 08/25/2015  . Prolonged Q-T interval on ECG 08/19/2015  . History of pulmonary embolism 08/10/2015  . Mild persistent asthma 04/13/2015  . Fibroid, uterine   . Morbid obesity ([place]) 11/11/2014  . Sinus tachycardia 11/08/2014  . Migraine 10/14/2014  . Menorrhagia 09/08/2014    Past Surgical History:  Procedure Laterality Date  . ANGIOGRAM TO LEG  08-13-15   RIGHT  . CHOLECYSTECTOMY    . IR GENERIC HISTORICAL  04/04/2016   IR RADIOLOGIST EVAL & MGMT 04/04/2016 [name], MD  [facility] INTERV RAD  . THYROIDECTOMY N/A 11/17/2015   Procedure: TOTAL THYROIDECTOMY;  Surgeon: Armandina Gemma, MD;  Location: [facility] ORS;  Service: General;  Laterality: N/A;  . UTERINE ARTERY EMBOLIZATION Bilateral 08/13/2015     OB History    Gravida  1   Para  1   Term      Preterm  1   AB      Living  1     SAB      TAB      Ectopic      Multiple      Live Births               Home Medications    Prior to Admission medications   Medication Sig Start Date End Date Taking? Authorizing Provider  ACCU-CHEK AVIVA PLUS test strip USE TO TEST BLOOD GLUCOSE FOUR TIMES DAILY BEFORE MEALS AND AT BEDTIME 11/06/15   Boykin Nearing, MD  ACCU-CHEK SOFTCLIX LANCETS lancets   10/20/17   [provider]  albuterol (PROVENTIL) (2.5 MG/3ML) 0.083% nebulizer solution TAKE 3 MLS (2.5 MG TOTAL) BY NEBULIZATION EVERY 6 HOURS AS NEEDED FORWHEEZING OR SHORTNESS OF BREATH Patient taking differently: Take 2.5 mg by nebulization every 6 (six) hours as needed for wheezing or shortness of breath.  05/13/17   [name], MD  apixaban (ELIQUIS) 5 MG TABS tablet Take 1 tablet (5 mg total) by mouth 2 (two) times daily. 06/06/18   [name], MD  atorvastatin (LIPITOR) 20 MG tablet TAKE 1 TABLET BY MOUTH ONCE DAILY 05/08/18   [name], MD  budesonide-formoterol [facility]) 160-4.5 MCG/ACT inhaler Inhale 2 puffs into the lungs 2 (two) times daily. 03/12/18   Collene Gobble, MD  buPROPion (WELLBUTRIN XL) 300 MG 24 hr tablet Take 300 mg by mouth daily.  09/12/17   [provider]  busPIRone (BUSPAR) 5 MG tablet Take 5 mg by mouth 3 (three) times daily.    [provider]  citalopram (CELEXA) 40 MG tablet Take 40 mg by mouth daily.  12/01/17   [provider]  eletriptan (RELPAX) 40 MG tablet Take 1 tablet (40 mg total) by mouth as needed for migraine or headache. May repeat in 2 hours if headache persists or recurs. Patient not taking: Reported on 05/27/2018 04/13/18   [name], DO  fluticasone (CUTIVATE) 0.05 % cream Apply topically 2 (two) times daily. 05/13/16   Funches, Adriana Mccallum, MD  fluticasone (FLONASE) 50 MCG/ACT nasal spray INSTILL 2 SPRAYS INTO BOTH NOSTRILS DAILY 05/08/18   [name], MD  Fluticasone-Salmeterol (ADVAIR DISKUS) 250-50 MCG/DOSE AEPB Inhale 1 puff into the lungs 2 (two) times daily. Patient not taking: Reported on 05/27/2018 10/31/17   [name], MD  Fremanezumab-vfrm (AJOVY) 225 MG/1.5ML SOSY Inject 225 mg into the skin every 30 (thirty) days. Patient not taking: Reported on 05/27/2018 05/08/18   [name], DO  gabapentin (NEURONTIN) 100 MG capsule Take 1 capsule (100 mg total) by mouth 3  (three) times daily. 02/26/18   [name], MD  HUMALOG KWIKPEN 100 UNIT/ML KiwkPen Inject 8 Units into the skin 3 (three) times daily.  12/25/17   [provider]  hydrochlorothiazide (MICROZIDE) 12.5 MG capsule TAKE 1 CAPSULE BY MOUTH ONCE DAILY 06/10/18   [name], MD  LANTUS SOLOSTAR 100 UNIT/ML Solostar Pen Inject 24 Units into the skin daily at 10 pm.  09/02/17   [provider]  levothyroxine (SYNTHROID, LEVOTHROID) 175  MCG tablet Take 1 tablet (175 mcg total) by mouth daily. 02/02/18   [name], MD  loratadine (CLARITIN) 10 MG tablet Take 1 tablet (10 mg total) by mouth daily. 10/31/17   [name], MD  metFORMIN (GLUCOPHAGE) 850 MG tablet Take 1 tablet (850 mg total) by mouth 2 (two) times daily with a meal. 08/08/17   [name], MD  methocarbamol (ROBAXIN) 500 MG tablet TAKE 1 TABLET BY MOUTH EVERY 8 HOURS AS NEEDED FOR MUSCLE SPASMS 07/04/18   [name], MD  metoprolol tartrate (LOPRESSOR) 50 MG tablet TAKE 1 TABLET BY MOUTH TWICE DAILY 05/08/18   [name], MD  montelukast (SINGULAIR) 10 MG tablet TAKE 1 TABLET BY MOUTH EVERY NIGHT AT BEDTIME 05/08/18   [name], MD  pantoprazole (PROTONIX) 40 MG tablet Take 1 tablet (40 mg total) by mouth daily. Patient not taking: Reported on 05/27/2018 03/12/18   Collene Gobble, MD  prazosin (MINIPRESS) 1 MG capsule Take 1 mg by mouth at bedtime.    [provider]  topiramate (TOPAMAX) 200 MG tablet Take 1 tablet (200 mg total) by mouth at bedtime. 06/16/18   [name], DO  topiramate (TOPAMAX) 50 MG tablet Take 3 tablets at bedtime for 7 days, then increase to 4 tablets at bedtime.  Contact us for refill 04/13/18   Metta Clines R, DO  traMADol (ULTRAM) 50 MG tablet Take 1 tablet (50 mg total) by mouth daily as needed for moderate pain or severe pain. 05/27/18   Argentina Donovan, PA-C  UNIFINE PENTIPS 32G X 4 MM MISC  10/20/17   [provider]     Family History Family History  Problem Relation Age of Onset  . Diabetes Mother   . Breast cancer Mother   . CAD Mother   . Hypertension Mother   . Alcohol abuse Father   . Hypertension Father   . Breast cancer Maternal Aunt   . Breast cancer Maternal Aunt     Social History Social History   Tobacco Use  . Smoking status: Former Smoker    Packs/day: 0.50    Years: 40.00    Pack years: 20.00    Types: Cigarettes    Last attempt to quit: 11/03/2014    Years since quitting: 3.6  . Smokeless tobacco: Never Used  Substance Use Topics  . Alcohol use: No    Alcohol/week: 0.0 standard drinks  . Drug use: No     Allergies   Ceftriaxone; Penicillins; Shellfish allergy; Gold-containing drug products; Nickel; and Citrus   Review of Systems Review of Systems  Musculoskeletal: Positive for arthralgias, back pain and neck pain.  Neurological: Positive for headaches.  All other systems reviewed and are negative.    Physical Exam Updated Vital Signs BP 130/77   Pulse 86   Temp 98.2 F (36.8 C) (Oral)   Resp 14   Ht 5\' 9"  (1.753 m)   Wt 136.1 kg   SpO2 97%   BMI 44.30 kg/m   Physical Exam Vitals signs and nursing note reviewed.  Constitutional:      Appearance: She is well-developed.     Comments: obese  HENT:     Head: Normocephalic and atraumatic.     Comments: No visible signs of head trauma, face is atraumatic Eyes:     Conjunctiva/sclera: Conjunctivae normal.     Pupils: Pupils are equal, round, and reactive to light.  Neck:     Comments: c-collar  in place Cardiovascular:     Rate and Rhythm: Normal rate and regular rhythm.     Heart sounds: Normal heart sounds.  Pulmonary:     Effort: Pulmonary effort is normal.     Breath sounds: Normal breath sounds.  Abdominal:     General: Bowel sounds are normal.     Palpations: Abdomen is soft.  Musculoskeletal: Normal range of motion.     Comments: Contusion left elbow, full ROM, no deformity Left knee  with contusion, pain elicited with attempted ROM, no visible swelling or deformity noted; reports some pain in the left lower leg but no signs of trauma to this area Right hip mildly tender along posterior aspect extending into lumbar spine; no leg shortening No mid/thoracic back pain noted DP pulses intact bilaterally  Skin:    General: Skin is warm and dry.  Neurological:     Mental Status: She is alert and oriented to person, place, and time.     Comments: AAOx3, answering questions and following commands appropriately; equal strength UE and LE bilaterally; CN grossly intact; moves all extremities appropriately without ataxia; no focal neuro deficits or facial asymmetry appreciated      ED Treatments / Results  Labs (all labs ordered are listed, but only abnormal results are displayed) Labs Reviewed - No data to display  EKG None  Radiology Dg Lumbar Spine Complete  Result Date: 07/08/2018 CLINICAL DATA:  Fall down stairs EXAM: LUMBAR SPINE - COMPLETE 4+ VIEW COMPARISON:  07/11/2017 FINDINGS: Lumbar alignment within normal limits. Vertebral body heights are normal. Moderate degenerative change at L5-S1 with mild degenerative change at L4-L5. Aortic atherosclerosis. IMPRESSION: Degenerative changes without acute osseous abnormality Electronically Signed   By: Donavan Foil M.D.   On: 07/08/2018 23:23   Dg Elbow Complete Left  Result Date: 07/08/2018 CLINICAL DATA:  Fall with elbow pain EXAM: LEFT ELBOW - COMPLETE 3+ VIEW COMPARISON:  None. FINDINGS: There is no evidence of fracture, dislocation, or joint effusion. There is no evidence of arthropathy or other focal bone abnormality. Soft tissues are unremarkable. IMPRESSION: Negative. Electronically Signed   By: Donavan Foil M.D.   On: 07/08/2018 23:24   Dg Tibia/fibula Left  Result Date: 07/08/2018 CLINICAL DATA:  [address] down stairs EXAM: LEFT TIBIA AND FIBULA - 2 VIEW COMPARISON:  None. FINDINGS: No fracture or malalignment involving  the mid to distal tibia or fibula. Soft tissues are unremarkable. IMPRESSION: No acute osseous abnormality wall vein the mid to distal tibial or fibula Electronically Signed   By: Donavan Foil M.D.   On: 07/08/2018 23:20   Ct Head Wo Contrast  Result Date: 07/08/2018 CLINICAL DATA:  Initial evaluation for acute trauma, fall. EXAM: CT HEAD WITHOUT CONTRAST CT CERVICAL SPINE WITHOUT CONTRAST TECHNIQUE: Multidetector CT imaging of the head and cervical spine was performed following the standard protocol without intravenous contrast. Multiplanar CT image reconstructions of the cervical spine were also generated. COMPARISON:  Prior CT from 04/24/2017. FINDINGS: CT HEAD FINDINGS Brain: Cerebral volume within normal limits for patient age. No evidence for acute intracranial hemorrhage. No findings to suggest acute large vessel territory infarct. No mass lesion, midline shift, or mass effect. Ventricles are normal in size without evidence for hydrocephalus. No extra-axial fluid collection identified. Vascular: No hyperdense vessel identified.Scattered vascular calcifications noted within the carotid siphons. Skull: Scalp soft tissues demonstrate no acute abnormality. Calvarium intact. Sinuses/Orbits: Globes and orbital soft tissues within normal limits. Visualized paranasal sinuses are clear. No mastoid effusion. CT CERVICAL  SPINE FINDINGS Alignment: Straightening of the normal cervical lordosis. No listhesis or malalignment. Skull base and vertebrae: Skull base intact. Normal C1-2 articulations are preserved in the dens is intact. Vertebral body heights maintained. Soft tissues and spinal canal: No acute soft tissue abnormality within the neck. No abnormal prevertebral edema. Spinal canal within normal limits. Sequelae of prior thyroidectomy noted. Disc levels: Mild to moderate cervical spondylolysis at C4-5 through C6-7. Upper chest: Visualized upper chest demonstrates no acute finding. Visualized lung apices grossly  clear. Other: None. IMPRESSION: CT BRAIN: Negative head CT.  No acute intracranial abnormality identified. CT CERVICAL SPINE: 1. No acute traumatic injury within the cervical spine. 2. Mild to moderate cervical spondylolysis at C4-5 through C6-7. Electronically Signed   By: Jeannine Boga M.D.   On: 07/08/2018 23:59   Ct Cervical Spine Wo Contrast  Result Date: 07/08/2018 CLINICAL DATA:  Initial evaluation for acute trauma, fall. EXAM: CT HEAD WITHOUT CONTRAST CT CERVICAL SPINE WITHOUT CONTRAST TECHNIQUE: Multidetector CT imaging of the head and cervical spine was performed following the standard protocol without intravenous contrast. Multiplanar CT image reconstructions of the cervical spine were also generated. COMPARISON:  Prior CT from 04/24/2017. FINDINGS: CT HEAD FINDINGS Brain: Cerebral volume within normal limits for patient age. No evidence for acute intracranial hemorrhage. No findings to suggest acute large vessel territory infarct. No mass lesion, midline shift, or mass effect. Ventricles are normal in size without evidence for hydrocephalus. No extra-axial fluid collection identified. Vascular: No hyperdense vessel identified.Scattered vascular calcifications noted within the carotid siphons. Skull: Scalp soft tissues demonstrate no acute abnormality. Calvarium intact. Sinuses/Orbits: Globes and orbital soft tissues within normal limits. Visualized paranasal sinuses are clear. No mastoid effusion. CT CERVICAL SPINE FINDINGS Alignment: Straightening of the normal cervical lordosis. No listhesis or malalignment. Skull base and vertebrae: Skull base intact. Normal C1-2 articulations are preserved in the dens is intact. Vertebral body heights maintained. Soft tissues and spinal canal: No acute soft tissue abnormality within the neck. No abnormal prevertebral edema. Spinal canal within normal limits. Sequelae of prior thyroidectomy noted. Disc levels: Mild to moderate cervical spondylolysis at C4-5  through C6-7. Upper chest: Visualized upper chest demonstrates no acute finding. Visualized lung apices grossly clear. Other: None. IMPRESSION: CT BRAIN: Negative head CT.  No acute intracranial abnormality identified. CT CERVICAL SPINE: 1. No acute traumatic injury within the cervical spine. 2. Mild to moderate cervical spondylolysis at C4-5 through C6-7. Electronically Signed   By: Jeannine Boga M.D.   On: 07/08/2018 23:59   Dg Knee Complete 4 Views Left  Result Date: 07/08/2018 CLINICAL DATA:  [address] down stairs EXAM: LEFT KNEE - COMPLETE 4+ VIEW COMPARISON:  None. FINDINGS: Linear artifacts over the knee. No fracture or malalignment. Joint spaces are maintained. No knee effusion IMPRESSION: No acute osseous abnormality Electronically Signed   By: Donavan Foil M.D.   On: 07/08/2018 23:23   Dg Hip Unilat W Or Wo Pelvis 2-3 Views Right  Result Date: 07/08/2018 CLINICAL DATA:  [address] down stairs EXAM: DG HIP (WITH OR WITHOUT PELVIS) 2-3V RIGHT COMPARISON:  02/19/2018 FINDINGS: SI joints are non widened. Pubic symphysis and rami are intact. No fracture or malalignment. IMPRESSION: No acute osseous abnormality Electronically Signed   By: Donavan Foil M.D.   On: 07/08/2018 23:21    Procedures Procedures (including critical care time)  Medications Ordered in ED Medications  morphine 4 MG/ML injection 4 mg (4 mg Intravenous Given 07/08/18 2224)     Initial Impression / Assessment  and Plan / ED Course  I have reviewed the triage vital signs and the nursing notes.  Pertinent labs & imaging results that were available during my care of the patient were reviewed by me and considered in my medical decision making (see chart for details).  53 year old female here after mechanical fall at home.  Reports she missed a step, fell onto her backside, struck left elbow on the floor followed by right hip.  Does not think that she hit her head, denies loss of consciousness.  She is awake, alert, appropriately  oriented here.  Neurologically intact.  She does complain of headache but no visible signs of head trauma on exam.  C-collar in place.  She is anticoagulated with Eliquis.  Has area of contusion to left elbow and left knee but no bony deformities.  Right hip is atraumatic, no leg shortening.  Tenderness of the hip extending into the lumbar spine.  Will obtain imaging.  Dose of morphine given for pain.  Imaging is all negative, have reviewed results with patient.  c-collar removed, ranged neck without difficulty.  Remains neurologically intact here.  She was able to get up and ambulate here in the ED with walker which is baseline.  She appears stable for discharge.  Encouraged tylenol/motrin for pain, has had tramadol that she can continue when needed.  Follow-up with PCP.  Return here for any new/acute changes.  Final Clinical Impressions(s) / ED Diagnoses   Final diagnoses:  Fall at home, initial encounter    ED Discharge Orders    None       Larene Pickett, PA-C 07/09/18 [id number]    Virgel Manifold, MD 07/09/18 1606

## 2018-07-08 NOTE — ED Triage Notes (Signed)
Pt BIB [facility] for eval of fall after missing a step. Pt denies striking head or LOC. Endorses L knee, R hip and L arm pain. EMS reports ? Ext rotation. Reports + CSM to blt LE. Pt w/ hx of spinal injury as a child, walks w/ a walker. Pt anticoagulated on eliquis for afib and hx of PE/DVT. GCS 15, no obvious hemorrhage/external trauma. C-collar in place

## 2018-07-09 NOTE — ED Notes (Signed)
Reviewed d/c instructions with pt, who verbalized understanding and had no outstanding questions. Pt departed in NAD.   

## 2018-07-09 NOTE — Discharge Instructions (Signed)
All imaging today was normal. You may be a little sore-- can take tylenol or motrin. Follow-up with your primary care doctor. Return here for any new/acute changes.

## 2018-07-09 NOTE — ED Notes (Signed)
Pt able to ambulate with walker with standby assist only. Pt denies incr'd pain, weakness, SOB, dizziness, etc.

## 2018-07-10 MED FILL — PAZEO 0.7% EYE DROPS: 0.7 | 30 days supply | Qty: 3 | Fill #0

## 2018-07-15 DIAGNOSIS — Z7689 Persons encountering health services in other specified circumstances: Secondary | ICD-10-CM | POA: Diagnosis not present

## 2018-07-22 DIAGNOSIS — Z7689 Persons encountering health services in other specified circumstances: Secondary | ICD-10-CM | POA: Diagnosis not present

## 2018-08-03 DIAGNOSIS — E1165 Type 2 diabetes mellitus with hyperglycemia: Secondary | ICD-10-CM | POA: Diagnosis not present

## 2018-08-05 DIAGNOSIS — Z7689 Persons encountering health services in other specified circumstances: Secondary | ICD-10-CM | POA: Diagnosis not present

## 2018-08-12 DIAGNOSIS — Z7689 Persons encountering health services in other specified circumstances: Secondary | ICD-10-CM | POA: Diagnosis not present

## 2018-08-14 MED FILL — SYNTHROID 175 MCG TABLET: 175 | 30 days supply | Qty: 30 | Fill #6

## 2018-08-17 NOTE — Progress Notes (Signed)
NEUROLOGY FOLLOW UP OFFICE NOTE  Melinda [name] [id number]  HISTORY OF PRESENT ILLNESS: Melinda [name] is a 53 year old right-handed African-American woman with type 2 diabetes mellitus, glaucoma, asthma, chronic low back pain, PTSD, PE/DVT and history of thyroid cancer status post thyroidectomy who follows up for migraine.  UPDATE: She stopped using Flonase and headaches improved.  She also has been using a humidifier.  She also lost 79 lbs since May.  She eats mostly vegetables.  Little red meat only during menses.   Intensity:  moderate Duration:  They last an hour Frequency:  3 or 4 days over past 30 days Frequency of abortive medication: Has not been treating migraines. Current NSAIDS:  Contraindicated (on anticoagulation) Current analgesics:  Acetaminophen, tramadol Current triptans:  none Current ergotamine:  none Current anti-emetic:  Zofran ODT 4mg  Current muscle relaxants:  methocarbamil Current anti-anxiolytic:  none Current sleep aide:  Amitriptyline 50mg  at bedtime Current Antihypertensive medications:  Metoprolol 50mg  twice daily, HCTZ Current Antidepressant medications:  Amitriptyline 50mg , Wellbutrin 150mg , citalopram 10mg  Current Anticonvulsant medications:  topiramate 200 mg at bedtime, gabapentin 100mg  three times daily Current anti-CGRP:  None Current Vitamins/Herbal/Supplements:  Ferrous sulfate Current Antihistamines/Decongestants:  none Other therapy:  Essential oils, lavender  Sees psychiatrist and psychologist which helps  Caffeine: No Alcohol: No Smoker: No Diet: Hydrates.  No soda.  Eats baked and boiled chicken, mostly vegetables.  Little red meat only during menses.  No fried foods. Exercise: Stationary bike, limited due to chronic pain Depression: Yes; Anxiety: Yes Other pain: Chronic pain Sleep hygiene: Poor.  Has night terrors secondary to PTSD.  HISTORY:  Onset: Since her 98s Location: Right sided Quality: squeezing Initial Intensity:  8.5/10 (sometimes 10/10) Aura: Sometimes preceded with visual aura (sees a pinwheel) Prodrome: no Associated symptoms: Nausea, photophobia, phonophobia. No worse headache of life, change in quality, wakes her up from sleep. Initial Duration: 3 hours to all day; May: 45 minutes with Maxalt (reduces intensity to manageable 4/10 for another couple of hours) Initial Frequency: daily; May: 5 to 10 days per month in a cluster during her cycle Triggers: Apples, aged cheese, menstrual cycle Relieving factors: None Activity: Light activity aggravates  Past NSAIDS: ibuprofen Past analgesics: Excedrin Past abortive triptans: Sumatriptan tablet/NS/[place], rizatriptan 10mg  Past muscle relaxants: no Past anti-emetic: no Past antihypertensive medications: no Past antidepressant medications: no Past anticonvulsant medications: no Past vitamins/Herbal/Supplements: no Past antihistamines/decongestants: no Other past therapies: no  Family history of headache: Son has migraines.  CT of head from 01/16/15 was unremarkable.  In September 2018, she developed a "thunderclap" headache. Onset was over a month ago. It lasts for a couple of seconds and occurs once a week. She has not had any vision loss, unilateral numbness or weakness or vertigo.  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  PAST MEDICAL HISTORY: Past Medical History:  Diagnosis Date  . ADHD (attention deficit hyperactivity disorder)   . Arthritis   . Asthma   . Cancer ([place])   . Chicken pox AGE 47  . Diabetes mellitus without complication ([place])   . GERD (gastroesophageal reflux disease)   . Glaucoma    BOTH EYES, NO EYE DROPS  . Glaucoma   . History of blood transfusion MARCH 2016    2UNITS GIVEN AND IRON GIVEN  . Hyperglycemia 09/09/2014  . Hypertension   . 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 Iron deficiency anemia due to chronic blood loss 09/09/2014  . Low TSH level 09/08/2014  . Lumbar herniated disc   . Menorrhagia 09/08/2014  . Migraine    CLUSTER AND MIGRAINES  . Multiple thyroid nodules   . Ovarian mass, right 09/09/2014  . PE (pulmonary embolism) 2016  . Peripheral neuropathy    FINGER TIPS AND TOES NUMB SOME  . Preeclampsia  1983   WITH PREGNANCY  . PTSD (post-traumatic stress disorder)   . Scoliosis   . Seizures ([place]) AGE 56   NONE SINCE, HAD CHICKEN POX THEN    MEDICATIONS: Current Outpatient Medications on File Prior to Visit  Medication Sig Dispense Refill  . ACCU-CHEK AVIVA PLUS test strip USE TO TEST BLOOD GLUCOSE FOUR TIMES DAILY BEFORE MEALS AND AT BEDTIME 100 each 12  . ACCU-CHEK SOFTCLIX LANCETS lancets   3  . albuterol (PROVENTIL) (2.5 MG/3ML) 0.083% nebulizer solution TAKE 3 MLS (2.5 MG TOTAL) BY NEBULIZATION EVERY 6 HOURS AS NEEDED FORWHEEZING OR SHORTNESS OF BREATH (Patient taking differently: Take 2.5 mg by nebulization every 6 (six) hours as needed for wheezing or shortness of breath. ) 150 mL 1  . apixaban (ELIQUIS) 5 MG TABS tablet Take 1 tablet (5 mg total) by mouth 2 (two) times daily. 60 tablet 6  . atorvastatin (LIPITOR) 20 MG tablet TAKE 1 TABLET BY MOUTH ONCE DAILY 30 tablet 2  . budesonide-formoterol (SYMBICORT) 160-4.5 MCG/ACT inhaler Inhale 2 puffs into the lungs 2 (two) times daily. 2 Inhaler 0  . buPROPion (WELLBUTRIN XL) 300 MG 24 hr tablet Take 300 mg by mouth daily.   2  . busPIRone (BUSPAR) 5 MG tablet Take 5 mg by mouth 3 (three) times daily.    . citalopram (CELEXA) 40 MG tablet Take 40 mg by mouth daily.   2  . eletriptan (RELPAX) 40 MG tablet Take 1 tablet (40 mg total) by mouth as needed for migraine or headache. May repeat in 2 hours if headache persists or recurs. (Patient not taking: Reported on 05/27/2018) 10 tablet 1  .  fluticasone (CUTIVATE) 0.05 % cream Apply topically 2 (two) times daily. 30 g 2  . fluticasone (FLONASE) 50 MCG/ACT nasal spray INSTILL 2 SPRAYS INTO BOTH NOSTRILS DAILY 16 g 2  . Fluticasone-Salmeterol (ADVAIR DISKUS) 250-50 MCG/DOSE AEPB Inhale 1 puff into the lungs 2 (two) times daily. (Patient not taking: Reported on 05/27/2018) 60 each 7  . Fremanezumab-vfrm (AJOVY) 225 MG/1.5ML SOSY Inject 225 mg into the skin every 30 (thirty) days. (Patient not taking: Reported on 05/27/2018) 1 Syringe 11  . gabapentin (NEURONTIN) 100 MG capsule Take 1 capsule (100 mg total) by mouth 3 (three) times daily. 90 capsule 5  . HUMALOG KWIKPEN 100 UNIT/ML KiwkPen Inject 8 Units into the skin 3 (three) times daily.   5  . hydrochlorothiazide (MICROZIDE) 12.5 MG capsule TAKE 1 CAPSULE BY MOUTH ONCE DAILY 30 capsule 2  . LANTUS SOLOSTAR 100 UNIT/ML Solostar Pen Inject 24 Units into the skin daily at 10 pm.   0  . levothyroxine (SYNTHROID, LEVOTHROID) 175 MCG tablet Take 1 tablet (175 mcg total) by mouth daily. 30 tablet 0  . loratadine (CLARITIN) 10 MG tablet Take 1 tablet (10 mg total) by mouth daily. 30 tablet 11  . metFORMIN (GLUCOPHAGE) 850 MG tablet Take 1 tablet (850 mg total) by mouth 2 (two) times daily with a meal. 180 tablet 3  .  methocarbamol (ROBAXIN) 500 MG tablet TAKE 1 TABLET BY MOUTH EVERY 8 HOURS AS NEEDED FOR MUSCLE SPASMS 40 tablet 1  . metoprolol tartrate (LOPRESSOR) 50 MG tablet TAKE 1 TABLET BY MOUTH TWICE DAILY 60 tablet 2  . montelukast (SINGULAIR) 10 MG tablet TAKE 1 TABLET BY MOUTH EVERY NIGHT AT BEDTIME 30 tablet 2  . pantoprazole (PROTONIX) 40 MG tablet Take 1 tablet (40 mg total) by mouth daily. (Patient not taking: Reported on 05/27/2018) 30 tablet 5  . prazosin (MINIPRESS) 1 MG capsule Take 1 mg by mouth at bedtime.    . topiramate (TOPAMAX) 200 MG tablet Take 1 tablet (200 mg total) by mouth at bedtime. 30 tablet 3  . topiramate (TOPAMAX) 50 MG tablet Take 3 tablets at bedtime for 7  days, then increase to 4 tablets at bedtime.  Contact us for refill 120 tablet 0  . traMADol (ULTRAM) 50 MG tablet Take 1 tablet (50 mg total) by mouth daily as needed for moderate pain or severe pain. 30 tablet 1  . UNIFINE PENTIPS 32G X 4 MM MISC   0   No current facility-administered medications on file prior to visit.     ALLERGIES: Allergies  Allergen Reactions  . Ceftriaxone Anaphylaxis    ROCEPHIN  . Penicillins Shortness Of Breath    Has patient had a PCN reaction causing immediate rash, facial/tongue/throat swelling, SOB or lightheadedness with hypotension: Yes Has patient had a PCN reaction causing severe rash involving mucus membranes or skin necrosis: No Has patient had a PCN reaction that required hospitalization No Has patient had a PCN reaction occurring within the last 10 years: No If all of the above answers are "NO", then may proceed with Cephalosporin use.   . Shellfish Allergy Anaphylaxis  . Gold-Containing Drug Products     HANDS ITCH  . Nick[place]     HANDS SWELL  . Citrus Rash    FAMILY HISTORY: Family History  Problem Relation Age of Onset  . Diabetes Mother   . Breast cancer Mother   . CAD Mother   . Hypertension Mother   . Alcohol abuse Father   . Hypertension Father   . Breast cancer Maternal Aunt   . Breast cancer Maternal Aunt    SOCIAL HISTORY: Social History   Socioeconomic History  . Marital status: Single    Spouse name: Not on file  . Number of children: Not on file  . Years of education: Not on file  . Highest education level: Not on file  Occupational History  . Not on file  Social Needs  . Financial resource strain: Not on file  . Food insecurity:    Worry: Not on file    Inability: Not on file  . Transportation needs:    Medical: Not on file    Non-medical: Not on file  Tobacco Use  . Smoking status: Former Smoker    Packs/day: 0.50    Years: 40.00    Pack years: 20.00    Types: Cigarettes    Last attempt to quit:  11/03/2014    Years since quitting: 3.7  . Smokeless tobacco: Never Used  Substance and Sexual Activity  . Alcohol use: No    Alcohol/week: 0.0 standard drinks  . Drug use: No  . Sexual activity: Never    Birth control/protection: None  Lifestyle  . Physical activity:    Days per week: Not on file    Minutes per session: Not on file  . Stress: Not  on file  Relationships  . Social connections:    Talks on phone: Not on file    Gets together: Not on file    Attends religious service: Not on file    Active member of club or organization: Not on file    Attends meetings of clubs or organizations: Not on file    Relationship status: Not on file  . Intimate partner violence:    Fear of current or ex partner: Not on file    Emotionally abused: Not on file    Physically abused: Not on file    Forced sexual activity: Not on file  Other Topics Concern  . Not on file  Social History Narrative  . Not on file    REVIEW OF SYSTEMS: Constitutional: No fevers, chills, or sweats, no generalized fatigue, change in appetite Eyes: No visual changes, double vision, eye pain Ear, nose and throat: No hearing loss, ear pain, nasal congestion, sore throat Cardiovascular: No chest pain, palpitations Respiratory:  No shortness of breath at rest or with exertion, wheezes GastrointestinaI: No nausea, vomiting, diarrhea, abdominal pain, fecal incontinence Genitourinary:  No dysuria, urinary retention or frequency Musculoskeletal:  back pain Integumentary: No rash, pruritus, skin lesions Neurological: as above Psychiatric: No depression, insomnia, anxiety Endocrine: No palpitations, fatigue, diaphoresis, mood swings, change in appetite, change in weight, increased thirst Hematologic/Lymphatic:  No purpura, petechiae. Allergic/Immunologic: no itchy/runny eyes, nasal congestion, recent allergic reactions, rashes  PHYSICAL EXAM: Blood pressure 106/76, pulse 79, height 5\' 9"  (1.753 m), weight (!) 301 lb  (136.5 kg), SpO2 96 %. General: No acute distress.  Patient appears well-groomed.   Head:  Normocephalic/atraumatic Eyes:  Fundi examined but not visualized Neck: supple, no paraspinal tenderness, full range of motion Heart:  Regular rate and rhythm Lungs:  Clear to auscultation bilaterally Back: No paraspinal tenderness Neurological Exam: alert and oriented to person, place, and time. Attention span and concentration intact, recent and remote memory intact, fund of knowledge intact.  Speech fluent and not dysarthric, language intact.  CN II-XII intact. Bulk and tone normal, muscle strength 5/5 throughout.  Sensation to light touch, temperature and vibration intact.  Deep tendon reflexes 1+ throughout.  Finger to nose testing intact.  Antalgic gait, Romberg negative.  IMPRESSION: Migraine without aura, without status migrainosus, not intractable Morbid obesity (BMI 44.45) improving  PLAN: 1.  For preventative management, continue topiramate 200mg  at bedtime 2.  Limit use of pain relievers to no more than 2 days out of week to prevent risk of rebound or medication-overuse headache. 3.  Keep headache diary 4.  Continue diet and weight loss 5. Exercise, hydration, caffeine cessation, sleep hygiene, monitor for and avoid triggers 6.  Consider:  magnesium citrate 400mg  daily, riboflavin 400mg  daily, and coenzyme Q10 100mg  three times daily 7.  Follow up in 6 months.   Metta Clines, DO  CC: [name], MD

## 2018-08-18 ENCOUNTER — Encounter

## 2018-08-18 ENCOUNTER — Ambulatory Visit: Payer: Medicaid Other | Admitting: Neurology

## 2018-08-18 ENCOUNTER — Encounter: Payer: Self-pay | Admitting: Neurology

## 2018-08-18 VITALS — BP 106/76 | HR 79 | Ht 69.0 in | Wt 301.0 lb

## 2018-08-18 DIAGNOSIS — E119 Type 2 diabetes mellitus without complications: Secondary | ICD-10-CM | POA: Diagnosis not present

## 2018-08-18 DIAGNOSIS — Z85850 Personal history of malignant neoplasm of thyroid: Secondary | ICD-10-CM | POA: Diagnosis not present

## 2018-08-18 DIAGNOSIS — E890 Postprocedural hypothyroidism: Secondary | ICD-10-CM | POA: Diagnosis not present

## 2018-08-18 DIAGNOSIS — G43009 Migraine without aura, not intractable, without status migrainosus: Secondary | ICD-10-CM

## 2018-08-18 NOTE — Patient Instructions (Signed)
1.  Continue topiramate 200mg  at bedtime 2.  Limit use of pain relievers to no more than 2 days out of week to prevent risk of rebound or medication-overuse headache. 3.  Keep headache diary 4.  Continue weight loss/diet. 5.  Follow up in 6 months.

## 2018-08-19 DIAGNOSIS — Z7689 Persons encountering health services in other specified circumstances: Secondary | ICD-10-CM | POA: Diagnosis not present

## 2018-08-21 DIAGNOSIS — F411 Generalized anxiety disorder: Secondary | ICD-10-CM | POA: Diagnosis not present

## 2018-08-24 DIAGNOSIS — F411 Generalized anxiety disorder: Secondary | ICD-10-CM | POA: Diagnosis not present

## 2018-08-25 ENCOUNTER — Other Ambulatory Visit: Payer: Self-pay | Admitting: Internal Medicine

## 2018-08-25 DIAGNOSIS — J4550 Severe persistent asthma, uncomplicated: Secondary | ICD-10-CM

## 2018-08-25 DIAGNOSIS — E118 Type 2 diabetes mellitus with unspecified complications: Secondary | ICD-10-CM

## 2018-08-25 DIAGNOSIS — Z794 Long term (current) use of insulin: Secondary | ICD-10-CM

## 2018-08-25 MED FILL — METHOCARBAMOL 500 MG TABLET: 500 | 14 days supply | Qty: 40 | Fill #1

## 2018-08-25 MED FILL — LORATADINE 10 MG TABS: 10 | 30 days supply | Qty: 30 | Fill #8

## 2018-08-25 MED FILL — PAZEO 0.7% EYE DROPS: 0.7 | 30 days supply | Qty: 3 | Fill #1

## 2018-08-25 MED FILL — CITALOPRAM HBR 40 MG TABLET: 40 | 30 days supply | Qty: 30 | Fill #1

## 2018-08-25 MED FILL — HYDROCHLOROTHIAZIDE 12.5 MG: 12.5 | 30 days supply | Qty: 30 | Fill #2

## 2018-08-25 MED FILL — busPIRone HCL 5 MG TABS: 5 | 30 days supply | Qty: 60 | Fill #1

## 2018-08-25 MED FILL — PRAZOSIN 1 MG CAPSULE: 1 | 30 days supply | Qty: 30 | Fill #0

## 2018-08-25 MED FILL — ELIQUIS 5 MG TABLET: 5 | 30 days supply | Qty: 60 | Fill #2

## 2018-08-25 MED FILL — buPROPion HCL ER (XL) 300 M: 300 | 30 days supply | Qty: 30 | Fill #1

## 2018-08-25 MED FILL — GABAPENTIN 100 MG CAPSULE: 100 | 30 days supply | Qty: 90 | Fill #3

## 2018-08-26 DIAGNOSIS — F411 Generalized anxiety disorder: Secondary | ICD-10-CM | POA: Diagnosis not present

## 2018-08-26 MED FILL — ATORVASTATIN 20 MG TABLET: 20 | 30 days supply | Qty: 30 | Fill #0

## 2018-08-26 MED FILL — metFORMIN HCL 850 MG TABS: 850 | 30 days supply | Qty: 60 | Fill #0

## 2018-08-26 MED FILL — METOPROLOL TARTRATE 50 MG T: 50 | 30 days supply | Qty: 60 | Fill #0

## 2018-08-26 MED FILL — MONTELUKAST SOD 10 MG TAB: 10 | 30 days supply | Qty: 30 | Fill #0

## 2018-08-27 MED FILL — traMADol HCL 50 MG TABS: 50 | 7 days supply | Qty: 7 | Fill #0 | Status: TO

## 2018-08-28 ENCOUNTER — Telehealth: Payer: Self-pay | Admitting: Internal Medicine

## 2018-08-28 ENCOUNTER — Encounter: Payer: Self-pay | Admitting: Internal Medicine

## 2018-08-28 ENCOUNTER — Ambulatory Visit: Payer: Medicaid Other

## 2018-08-28 ENCOUNTER — Ambulatory Visit: Payer: Medicaid Other | Attending: Internal Medicine | Admitting: Internal Medicine

## 2018-08-28 VITALS — BP 158/98 | HR 81 | Temp 98.3°F | Resp 16 | Ht 69.0 in | Wt 300.4 lb

## 2018-08-28 DIAGNOSIS — Z8249 Family history of ischemic heart disease and other diseases of the circulatory system: Secondary | ICD-10-CM | POA: Diagnosis not present

## 2018-08-28 DIAGNOSIS — Z86711 Personal history of pulmonary embolism: Secondary | ICD-10-CM | POA: Insufficient documentation

## 2018-08-28 DIAGNOSIS — Z7901 Long term (current) use of anticoagulants: Secondary | ICD-10-CM | POA: Insufficient documentation

## 2018-08-28 DIAGNOSIS — R140 Abdominal distension (gaseous): Secondary | ICD-10-CM | POA: Insufficient documentation

## 2018-08-28 DIAGNOSIS — Z79899 Other long term (current) drug therapy: Secondary | ICD-10-CM | POA: Insufficient documentation

## 2018-08-28 DIAGNOSIS — C73 Malignant neoplasm of thyroid gland: Secondary | ICD-10-CM | POA: Diagnosis not present

## 2018-08-28 DIAGNOSIS — E118 Type 2 diabetes mellitus with unspecified complications: Secondary | ICD-10-CM | POA: Insufficient documentation

## 2018-08-28 DIAGNOSIS — Z9181 History of falling: Secondary | ICD-10-CM | POA: Insufficient documentation

## 2018-08-28 DIAGNOSIS — Z8669 Personal history of other diseases of the nervous system and sense organs: Secondary | ICD-10-CM | POA: Diagnosis not present

## 2018-08-28 DIAGNOSIS — Z794 Long term (current) use of insulin: Secondary | ICD-10-CM | POA: Diagnosis not present

## 2018-08-28 DIAGNOSIS — I10 Essential (primary) hypertension: Secondary | ICD-10-CM | POA: Diagnosis not present

## 2018-08-28 DIAGNOSIS — Z86718 Personal history of other venous thrombosis and embolism: Secondary | ICD-10-CM | POA: Insufficient documentation

## 2018-08-28 DIAGNOSIS — R197 Diarrhea, unspecified: Secondary | ICD-10-CM | POA: Insufficient documentation

## 2018-08-28 DIAGNOSIS — J4530 Mild persistent asthma, uncomplicated: Secondary | ICD-10-CM | POA: Insufficient documentation

## 2018-08-28 DIAGNOSIS — Z6841 Body Mass Index (BMI) 40.0 and over, adult: Secondary | ICD-10-CM | POA: Insufficient documentation

## 2018-08-28 DIAGNOSIS — Z87891 Personal history of nicotine dependence: Secondary | ICD-10-CM | POA: Insufficient documentation

## 2018-08-28 DIAGNOSIS — G43909 Migraine, unspecified, not intractable, without status migrainosus: Secondary | ICD-10-CM | POA: Insufficient documentation

## 2018-08-28 DIAGNOSIS — F4310 Post-traumatic stress disorder, unspecified: Secondary | ICD-10-CM | POA: Insufficient documentation

## 2018-08-28 LAB — GLUCOSE, POCT (MANUAL RESULT ENTRY): POC GLUCOSE: 138 mg/dL — AB (ref 70–99)

## 2018-08-28 MED FILL — DOXEPIN 25 MG CAPSULE: 25 | 30 days supply | Qty: 30 | Fill #1

## 2018-08-28 MED FILL — SYNTHROID 200 MCG TABLET: 200 | 30 days supply | Qty: 30 | Fill #0

## 2018-08-28 NOTE — Telephone Encounter (Signed)
Pt came in stating on of her other Drs is changing her medciation Levothyroxine from  175 to 200

## 2018-08-28 NOTE — Progress Notes (Signed)
Patient ID: Curstin Schmale, female    DOB: 11/29/1965  MRN: [id number]  CC: Diabetes and Hypertension   Subjective: Michala Deblanc is a 53 y.o. female who presents for chronic ds management.  Her concerns today include:  53 year old with history ofDM(followed by Dr. Jana Hakim, moderate persistent asthma,migraines followed by neurology, uterine fibroids followed by GYN, papillary thyroid CA s/pthyroidectomy, history of DVT/PE onEliquislifetime,obesity and left shoulder pain.  Since last visit with me patient was seen in the ER last month for mechanical fall at home.  Patient states that she was coming down a ramp using her Rollator walker and then had to step down 1 step off of the ramp and lost her balance.  She landed on her buttocks and left elbow.  Imaging studies done in the ED included CT of the cervical spine and head, right hip, left knee and elbow were negative for any acute findings.  She uses her Rollator walker consistently.  She feels she would benefit from some physical therapy for help going up and down steps  C/o watery diarrhea within the past 1 mth.   Having 3-4 loose stools a day. Associated with bloating and a lot of gas.  +cramping.  No blood in stools or fever.  No recent changes in dose of Metformin.  No recent travel outside of [place]  No abx use over past 3 mths. Changed to more of a plant base diet since May 2019, loss 80 lbs since then.   -thyroid level checked by Dr. Buddy Duty earlier this mth and was okay per patient.   DM:  Saw Dr. Buddy Duty 08/18/2018.  Lantus and Humalog d/c.  She was left on metformin Checks blood sugars BID.  Average over last 90 days was 118. No exercise "cause it's painful."  Does not have silver sneakers with her insurance.  Unable to get to the [facility] to do pool therapy because of limited transportation.   Migraines: Saw Dr. [name] since last visit.  She was to be started on a Ajovy but it was not covered by Medicaid.  Patient states she figured out  that Flonase nasal spray was causing or contributing to her headaches.  Headaches have been less than she stopped the Flonase.      DVT/PE:  No bruising or bleeding on Eliquis.  HTN:  Did not take meds as yet for the morning Checks BP once a wk.  Usually in the 120s/70s.  Limits salt. +LE edema. Sleeps with head elevated  PTSD/anxiety: Followed by [facility].   trazadone changed to Doxapen.  She feels she is doing well on her current medications that include Celexa, Wellbutrin and BuSpar  Patient Active Problem List   Diagnosis Date Noted  . Chronic cough 03/12/2018  . Left shoulder pain 10/14/2016  . Hypertension 10/14/2016  . S/P thyroidectomy 06/27/2016  . Chronic bilateral low back pain without sciatica 04/22/2016  . Liver lesion 12/24/2015  . Papillary thyroid carcinoma ([place]) 12/21/2015  . Controlled type 2 diabetes mellitus with complication, with long-term current use of insulin ([place]) 10/10/2015  . Right leg DVT ([place]) 10/10/2015  . Vision loss 08/25/2015  . Decreased vision in both eyes 08/25/2015  . Prolonged Q-T interval on ECG 08/19/2015  . History of pulmonary embolism 08/10/2015  . Mild persistent asthma 04/13/2015  . Fibroid, uterine   . Morbid obesity ([place]) 11/11/2014  . Sinus tachycardia 11/08/2014  . Migraine 10/14/2014  . Menorrhagia 09/08/2014     Current Outpatient Medications on File Prior to  Visit  Medication Sig Dispense Refill  . ACCU-CHEK AVIVA PLUS test strip USE TO TEST BLOOD GLUCOSE FOUR TIMES DAILY BEFORE MEALS AND AT BEDTIME 100 each 12  . ACCU-CHEK SOFTCLIX LANCETS lancets   3  . albuterol (PROVENTIL) (2.5 MG/3ML) 0.083% nebulizer solution TAKE 3 MLS (2.5 MG TOTAL) BY NEBULIZATION EVERY 6 HOURS AS NEEDED FORWHEEZING OR SHORTNESS OF BREATH (Patient taking differently: Take 2.5 mg by nebulization every 6 (six) hours as needed for wheezing or shortness of breath. ) 150 mL 1  . apixaban (ELIQUIS) 5 MG TABS tablet Take 1 tablet (5 mg total) by  mouth 2 (two) times daily. 60 tablet 6  . atorvastatin (LIPITOR) 20 MG tablet TAKE 1 TABLET BY MOUTH ONCE DAILY 30 tablet 2  . budesonide-formoterol (SYMBICORT) 160-4.5 MCG/ACT inhaler Inhale 2 puffs into the lungs 2 (two) times daily. 2 Inhaler 0  . buPROPion (WELLBUTRIN XL) 300 MG 24 hr tablet Take 300 mg by mouth daily.   2  . busPIRone (BUSPAR) 5 MG tablet Take 5 mg by mouth 3 (three) times daily.    . citalopram (CELEXA) 40 MG tablet Take 40 mg by mouth daily.   2  . eletriptan (RELPAX) 40 MG tablet Take 1 tablet (40 mg total) by mouth as needed for migraine or headache. May repeat in 2 hours if headache persists or recurs. (Patient not taking: Reported on 05/27/2018) 10 tablet 1  . fluticasone (CUTIVATE) 0.05 % cream Apply topically 2 (two) times daily. 30 g 2  . fluticasone (FLONASE) 50 MCG/ACT nasal spray INSTILL 2 SPRAYS INTO BOTH NOSTRILS DAILY 16 g 2  . Fluticasone-Salmeterol (ADVAIR DISKUS) 250-50 MCG/DOSE AEPB Inhale 1 puff into the lungs 2 (two) times daily. (Patient not taking: Reported on 05/27/2018) 60 each 7  . Fremanezumab-vfrm (AJOVY) 225 MG/1.5ML SOSY Inject 225 mg into the skin every 30 (thirty) days. (Patient not taking: Reported on 05/27/2018) 1 Syringe 11  . gabapentin (NEURONTIN) 100 MG capsule Take 1 capsule (100 mg total) by mouth 3 (three) times daily. 90 capsule 5  . HUMALOG KWIKPEN 100 UNIT/ML KiwkPen Inject 8 Units into the skin 3 (three) times daily.   5  . hydrochlorothiazide (MICROZIDE) 12.5 MG capsule TAKE 1 CAPSULE BY MOUTH ONCE DAILY 30 capsule 2  . LANTUS SOLOSTAR 100 UNIT/ML Solostar Pen Inject 24 Units into the skin daily at 10 pm.   0  . levothyroxine (SYNTHROID, LEVOTHROID) 175 MCG tablet Take 1 tablet (175 mcg total) by mouth daily. 30 tablet 0  . loratadine (CLARITIN) 10 MG tablet Take 1 tablet (10 mg total) by mouth daily. 30 tablet 11  . metFORMIN (GLUCOPHAGE) 850 MG tablet TAKE 1 TABLET BY MOUTH TWICE DAILY WITH MEALS 60 tablet 2  . methocarbamol  (ROBAXIN) 500 MG tablet TAKE 1 TABLET BY MOUTH EVERY 8 HOURS AS NEEDED FOR MUSCLE SPASMS 40 tablet 1  . metoprolol tartrate (LOPRESSOR) 50 MG tablet TAKE 1 TABLET BY MOUTH TWICE DAILY 60 tablet 2  . montelukast (SINGULAIR) 10 MG tablet TAKE 1 TABLET BY MOUTH EVERY NIGHT AT BEDTIME 30 tablet 2  . pantoprazole (PROTONIX) 40 MG tablet Take 1 tablet (40 mg total) by mouth daily. (Patient not taking: Reported on 05/27/2018) 30 tablet 5  . prazosin (MINIPRESS) 1 MG capsule Take 1 mg by mouth at bedtime.    . topiramate (TOPAMAX) 200 MG tablet Take 1 tablet (200 mg total) by mouth at bedtime. 30 tablet 3  . topiramate (TOPAMAX) 50 MG tablet Take 3 tablets  at bedtime for 7 days, then increase to 4 tablets at bedtime.  Contact us for refill 120 tablet 0  . traMADol (ULTRAM) 50 MG tablet Take 1 tablet (50 mg total) by mouth daily as needed for moderate pain or severe pain. 30 tablet 1  . UNIFINE PENTIPS 32G X 4 MM MISC   0   No current facility-administered medications on file prior to visit.     Allergies  Allergen Reactions  . Ceftriaxone Anaphylaxis    ROCEPHIN  . Penicillins Shortness Of Breath    Has patient had a PCN reaction causing immediate rash, facial/tongue/throat swelling, SOB or lightheadedness with hypotension: Yes Has patient had a PCN reaction causing severe rash involving mucus membranes or skin necrosis: No Has patient had a PCN reaction that required hospitalization No Has patient had a PCN reaction occurring within the last 10 years: No If all of the above answers are "NO", then may proceed with Cephalosporin use.   . Shellfish Allergy Anaphylaxis  . Gold-Containing Drug Products     HANDS ITCH  . [place]     HANDS SWELL  . [place]rus Rash    Social History   Socioeconomic History  . Marital status: Single    Spouse name: Not on file  . Number of children: Not on file  . Years of education: Not on file  . Highest education level: Not on file  Occupational History  . Not  on file  Social Needs  . Financial resource strain: Not on file  . Food insecurity:    Worry: Not on file    Inability: Not on file  . Transportation needs:    Medical: Not on file    Non-medical: Not on file  Tobacco Use  . Smoking status: Former Smoker    Packs/day: 0.50    Years: 40.00    Pack years: 20.00    Types: Cigarettes    Last attempt to quit: 11/03/2014    Years since quitting: 3.8  . Smokeless tobacco: Never Used  Substance and Sexual Activity  . Alcohol use: No    Alcohol/week: 0.0 standard drinks  . Drug use: No  . Sexual activity: Never    Birth control/protection: None  Lifestyle  . Physical activity:    Days per week: Not on file    Minutes per session: Not on file  . Stress: Not on file  Relationships  . Social connections:    Talks on phone: Not on file    Gets together: Not on file    Attends religious service: Not on file    Active member of club or organization: Not on file    Attends meetings of clubs or organizations: Not on file    Relationship status: Not on file  . Intimate partner violence:    Fear of current or ex partner: Not on file    Emotionally abused: Not on file    Physically abused: Not on file    Forced sexual activity: Not on file  Other Topics Concern  . Not on file  Social History Narrative  . Not on file    Family History  Problem Relation Age of Onset  . Diabetes Mother   . Breast cancer Mother   . CAD Mother   . Hypertension Mother   . Alcohol abuse Father   . Hypertension Father   . Breast cancer Maternal Aunt   . Breast cancer Maternal Aunt     Past Surgical History:  Procedure  Laterality Date  . ANGIOGRAM TO LEG  08-13-15   RIGHT  . CHOLECYSTECTOMY    . IR GENERIC HISTORICAL  04/04/2016   IR RADIOLOGIST EVAL & MGMT 04/04/2016 [name], MD [facility] INTERV RAD  . THYROIDECTOMY N/A 11/17/2015   Procedure: TOTAL THYROIDECTOMY;  Surgeon: Armandina Gemma, MD;  Location: [facility] ORS;  Service: General;  Laterality: N/A;  .  UTERINE ARTERY EMBOLIZATION Bilateral 08/13/2015    ROS: Review of Systems Negative except as stated above  PHYSICAL EXAM: BP (!) 158/98   Pulse 81   Temp 98.3 F (36.8 C) (Oral)   Resp 16   Ht 5\' 9"  (1.753 m)   Wt (!) 300 lb 6.4 oz (136.3 kg)   SpO2 97%   BMI 44.36 kg/m   Wt Readings from Last 3 Encounters:  08/28/18 (!) 300 lb 6.4 oz (136.3 kg)  08/18/18 (!) 301 lb (136.5 kg)  07/08/18 300 lb (136.1 kg)    Physical Exam  General appearance - alert, well appearing, and in no distress Mental status - normal mood, behavior, speech, dress, motor activity, and thought processes Neck - supple, no significant adenopathy Chest - clear to auscultation, no wheezes, rales or rhonchi, symmetric air entry Heart - normal rate, regular rhythm, normal S1, S2, no murmurs, rubs, clicks or gallops Musculoskeletal -ambulating with Rollator walker.  She gets onto exam table independently. Extremities -no lower extremity edema Results for orders placed or performed in visit on 08/28/18  POCT glucose (manual entry)  Result Value Ref Range   POC Glucose 138 (A) 70 - 99 mg/dl   Lab Results  Component Value Date   HGBA1C 5.3 05/27/2018    CMP Latest Ref Rng & Units 02/18/2018 09/19/2017 07/11/2017  Glucose 70 - 99 mg/dL 91 142(H) 118(H)  BUN 6 - 20 mg/dL 12 13 13   Creatinine 0.44 - 1.00 mg/dL 0.93 1.01(H) 0.97  Sodium 135 - 145 mmol/L 140 143 140  Potassium 3.5 - 5.1 mmol/L 3.4(L) 3.6 3.7  Chloride 98 - 111 mmol/L 106 109 108  CO2 22 - 32 mmol/L 24 25 21(L)  Calcium 8.9 - 10.3 mg/dL 9.0 8.5(L) 8.9  Total Protein 6.5 - 8.1 g/dL - 7.1 6.8  Total Bilirubin 0.3 - 1.2 mg/dL - 0.5 0.7  Alkaline Phos 38 - 126 U/L - 92 98  AST 15 - 41 U/L - 61(H) 16  ALT 14 - 54 U/L - 64(H) 28   Lipid Panel     Component Value Date/Time   CHOL 159 09/08/2014 0147   TRIG 89 09/08/2014 0147   HDL 58 09/08/2014 0147   CHOLHDL 2.7 09/08/2014 0147   VLDL 18 09/08/2014 0147   LDLCALC 83 09/08/2014 0147     CBC    Component Value Date/Time   WBC 6.9 02/18/2018 2337   RBC 5.85 (H) 02/18/2018 2337   HGB 17.1 (H) 02/18/2018 2337   HGB 13.5 02/03/2017 1028   HCT 52.1 (H) 02/18/2018 2337   HCT 41.2 02/03/2017 1028   PLT 289 02/18/2018 2337   PLT 389 (H) 02/03/2017 1028   MCV 89.1 02/18/2018 2337   MCV 86 02/03/2017 1028   MCH 29.2 02/18/2018 2337   MCHC 32.8 02/18/2018 2337   RDW 14.5 02/18/2018 2337   RDW 15.3 02/03/2017 1028   LYMPHSABS 1.9 02/18/2018 2337   MONOABS 0.6 02/18/2018 2337   EOSABS 0.1 02/18/2018 2337   BASOSABS 0.0 02/18/2018 2337    ASSESSMENT AND PLAN: 1. Controlled type 2 diabetes mellitus with complication,  without long-term current use of insulin ([place]) Patient doing well off insulin.  She has loss significant weight with changes in eating habits.  However given the diarrhea, if other causes are ruled out, she may need to speak with Dr. Buddy Duty about changing the metformin to a different medication to see if the diarrhea stops - POCT glucose (manual entry)  2. Diarrhea, unspecified type I recommend giving a stool sample today to check for C. difficile and routine culture.  However patient states that she is not able to produce a sample today and is not willing to come back to bring a sample due to limited transportation.  She will touch base with Dr. Buddy Duty about the metformin.  If metformin is stopped and she continues to have diarrhea the next step would be referral to gastroenterology  3. History of recent fall Will refer to physical therapy for some gait training when going up and down stairs - Ambulatory referral to Physical Therapy  4. History of migraine Better controlled having stopped Flonase per patient's report  5. Essential hypertension Not at goal but patient has not taken medicines as yet for the morning.  No changes made.  Continue current medications and low-salt diet - CBC - Comprehensive metabolic panel  6. Papillary thyroid carcinoma  ([place]) Followed by endocrinology  7. Class 3 severe obesity with serious comorbidity and body mass index (BMI) of 40.0 to 44.9 in adult, unspecified obesity type ([place]) Commended her on dietary changes and weight loss.  Encouraged her to try to get in some physical activity.  If transportation is an issue she can try walking around her building complex  8. History of DVT in adulthood Continue Eliquis     Patient was given the opportunity to ask questions.  Patient verbalized understanding of the plan and was able to repeat key elements of the plan.   Orders Placed This Encounter  Procedures  . POCT glucose (manual entry)     Requested Prescriptions    No prescriptions requested or ordered in this encounter    No follow-ups on file.  [name], MD, FACP

## 2018-08-28 NOTE — Telephone Encounter (Signed)
Pt was seen today went in pt chart and changed dosage for medication

## 2018-08-28 NOTE — Patient Instructions (Signed)
Let your endocrinologist know that you are having some diarrhea and inquire whether changing metformin to a different medication would be appropriate.  If diarrhea continues despite changing metformin, then we will need to refer you to a gastroenterologist for further work-up.  I have referred you for some physical therapy.

## 2018-08-29 LAB — CBC
HEMATOCRIT: 44.9 % (ref 34.0–46.6)
Hemoglobin: 15.2 g/dL (ref 11.1–15.9)
MCH: 29.6 pg (ref 26.6–33.0)
MCHC: 33.9 g/dL (ref 31.5–35.7)
MCV: 87 fL (ref 79–97)
Platelets: 315 10*3/uL (ref 150–450)
RBC: 5.14 x10E6/uL (ref 3.77–5.28)
RDW: 13.4 % (ref 11.7–15.4)
WBC: 10 10*3/uL (ref 3.4–10.8)

## 2018-08-29 LAB — COMPREHENSIVE METABOLIC PANEL
ALT: 25 IU/L (ref 0–32)
AST: 22 IU/L (ref 0–40)
Albumin/Globulin Ratio: 1.7 (ref 1.2–2.2)
Albumin: 4 g/dL (ref 3.8–4.8)
Alkaline Phosphatase: 100 IU/L (ref 39–117)
BUN / CREAT RATIO: 14 (ref 9–23)
BUN: 11 mg/dL (ref 6–24)
Bilirubin Total: 0.4 mg/dL (ref 0.0–1.2)
CO2: 19 mmol/L — ABNORMAL LOW (ref 20–29)
Calcium: 9 mg/dL (ref 8.7–10.2)
Chloride: 104 mmol/L (ref 96–106)
Creatinine, Ser: 0.78 mg/dL (ref 0.57–1.00)
GFR calc non Af Amer: 89 mL/min/{1.73_m2} (ref >59)
GFR, EST AFRICAN AMERICAN: 103 mL/min/{1.73_m2} (ref >59)
Globulin, Total: 2.4 g/dL (ref 1.5–4.5)
Glucose: 112 mg/dL — ABNORMAL HIGH (ref 65–99)
Potassium: 4.2 mmol/L (ref 3.5–5.2)
SODIUM: 140 mmol/L (ref 134–144)
Total Protein: 6.4 g/dL (ref 6.0–8.5)

## 2018-09-02 DIAGNOSIS — F411 Generalized anxiety disorder: Secondary | ICD-10-CM | POA: Diagnosis not present

## 2018-09-02 MED FILL — TOPIRAMATE 200 MG TABLET: 200 | 30 days supply | Qty: 30 | Fill #1

## 2018-09-04 DIAGNOSIS — E1165 Type 2 diabetes mellitus with hyperglycemia: Secondary | ICD-10-CM | POA: Diagnosis not present

## 2018-09-09 DIAGNOSIS — Z7689 Persons encountering health services in other specified circumstances: Secondary | ICD-10-CM | POA: Diagnosis not present

## 2018-09-14 DIAGNOSIS — E119 Type 2 diabetes mellitus without complications: Secondary | ICD-10-CM | POA: Diagnosis not present

## 2018-09-14 DIAGNOSIS — R197 Diarrhea, unspecified: Secondary | ICD-10-CM | POA: Diagnosis not present

## 2018-09-14 DIAGNOSIS — Z85850 Personal history of malignant neoplasm of thyroid: Secondary | ICD-10-CM | POA: Diagnosis not present

## 2018-09-14 DIAGNOSIS — Z794 Long term (current) use of insulin: Secondary | ICD-10-CM | POA: Diagnosis not present

## 2018-09-14 DIAGNOSIS — E890 Postprocedural hypothyroidism: Secondary | ICD-10-CM | POA: Diagnosis not present

## 2018-09-14 DIAGNOSIS — Z5181 Encounter for therapeutic drug level monitoring: Secondary | ICD-10-CM | POA: Diagnosis not present

## 2018-09-15 DIAGNOSIS — F411 Generalized anxiety disorder: Secondary | ICD-10-CM | POA: Diagnosis not present

## 2018-09-15 DIAGNOSIS — F4310 Post-traumatic stress disorder, unspecified: Secondary | ICD-10-CM | POA: Diagnosis not present

## 2018-09-15 DIAGNOSIS — F331 Major depressive disorder, recurrent, moderate: Secondary | ICD-10-CM | POA: Diagnosis not present

## 2018-09-15 MED FILL — busPIRone HCL 5 MG TABS: 5 | 30 days supply | Qty: 90 | Fill #0

## 2018-09-16 DIAGNOSIS — F411 Generalized anxiety disorder: Secondary | ICD-10-CM | POA: Diagnosis not present

## 2018-09-16 MED FILL — CITALOPRAM HBR 40 MG TABLET: 40 | 30 days supply | Qty: 30 | Fill #0

## 2018-09-16 MED FILL — buPROPion HCL ER (XL) 300 M: 300 | 30 days supply | Qty: 30 | Fill #0

## 2018-09-19 MED FILL — DOXEPIN 25 MG CAPSULE: 25 | 30 days supply | Qty: 30 | Fill #0

## 2018-09-23 DIAGNOSIS — F411 Generalized anxiety disorder: Secondary | ICD-10-CM | POA: Diagnosis not present

## 2018-09-29 ENCOUNTER — Other Ambulatory Visit: Payer: Self-pay | Admitting: Internal Medicine

## 2018-09-29 DIAGNOSIS — G8929 Other chronic pain: Secondary | ICD-10-CM

## 2018-09-29 DIAGNOSIS — M545 Low back pain: Principal | ICD-10-CM

## 2018-09-29 DIAGNOSIS — I10 Essential (primary) hypertension: Secondary | ICD-10-CM

## 2018-09-29 MED FILL — METOPROLOL TARTRATE 50 MG T: 50 | 30 days supply | Qty: 60 | Fill #1

## 2018-09-29 MED FILL — GABAPENTIN 100 MG CAPSULE: 100 | 30 days supply | Qty: 90 | Fill #4

## 2018-09-29 MED FILL — TOPIRAMATE 200 MG TABLET: 200 | 30 days supply | Qty: 30 | Fill #2

## 2018-09-29 MED FILL — MONTELUKAST SOD 10 MG TAB: 10 | 30 days supply | Qty: 30 | Fill #1

## 2018-09-29 MED FILL — SYNTHROID 200 MCG TABLET: 200 | 30 days supply | Qty: 30 | Fill #1

## 2018-09-29 MED FILL — METHOCARBAMOL 500 MG TABLET: 500 | 14 days supply | Qty: 40 | Fill #0

## 2018-09-29 MED FILL — ATORVASTATIN 20 MG TABLET: 20 | 30 days supply | Qty: 30 | Fill #1

## 2018-09-29 MED FILL — PRAZOSIN 1 MG CAPSULE: 1 | 30 days supply | Qty: 30 | Fill #1

## 2018-09-29 MED FILL — HYDROCHLOROTHIAZIDE 12.5 MG: 12.5 | 30 days supply | Qty: 30 | Fill #0

## 2018-09-29 MED FILL — ELIQUIS 5 MG TABLET: 5 | 30 days supply | Qty: 60 | Fill #3

## 2018-09-30 DIAGNOSIS — F411 Generalized anxiety disorder: Secondary | ICD-10-CM | POA: Diagnosis not present

## 2018-10-02 DIAGNOSIS — E1165 Type 2 diabetes mellitus with hyperglycemia: Secondary | ICD-10-CM | POA: Diagnosis not present

## 2018-10-07 DIAGNOSIS — F411 Generalized anxiety disorder: Secondary | ICD-10-CM | POA: Diagnosis not present

## 2018-10-14 DIAGNOSIS — F411 Generalized anxiety disorder: Secondary | ICD-10-CM | POA: Diagnosis not present

## 2018-10-21 DIAGNOSIS — F411 Generalized anxiety disorder: Secondary | ICD-10-CM | POA: Diagnosis not present

## 2018-10-29 MED FILL — ATORVASTATIN 20 MG TABLET: 20 | 30 days supply | Qty: 30 | Fill #2

## 2018-10-29 MED FILL — CITALOPRAM HBR 40 MG TABLET: 40 | 30 days supply | Qty: 30 | Fill #1

## 2018-10-29 MED FILL — GABAPENTIN 100 MG CAPSULE: 100 | 30 days supply | Qty: 90 | Fill #5

## 2018-10-29 MED FILL — DOXEPIN HCL 25 MG CAPS: 25 | 30 days supply | Qty: 30 | Fill #1

## 2018-10-29 MED FILL — buPROPion HCL ER (XL) 300 M: 300 | 30 days supply | Qty: 30 | Fill #1

## 2018-10-29 MED FILL — busPIRone HCL 5 MG TABS: 5 | 30 days supply | Qty: 90 | Fill #1

## 2018-10-29 MED FILL — ELIQUIS 5 MG TABLET: 5 | 30 days supply | Qty: 60 | Fill #4

## 2018-10-29 MED FILL — SYNTHROID 200 MCG TABLET: 200 | 30 days supply | Qty: 30 | Fill #2

## 2018-10-29 MED FILL — METHOCARBAMOL 500 MG TABLET: 500 | 14 days supply | Qty: 40 | Fill #1

## 2018-10-29 MED FILL — TOPIRAMATE 200 MG TABLET: 200 | 30 days supply | Qty: 30 | Fill #3

## 2018-10-29 MED FILL — PRAZOSIN 1 MG CAPSULE: 1 | 30 days supply | Qty: 30 | Fill #2

## 2018-10-29 MED FILL — traMADol HCL 50 MG TABS: 50 | 7 days supply | Qty: 7 | Fill #0

## 2018-10-29 MED FILL — METOPROLOL TARTRATE 50 MG T: 50 | 30 days supply | Qty: 60 | Fill #2

## 2018-10-29 MED FILL — MONTELUKAST SOD 10 MG TAB: 10 | 30 days supply | Qty: 30 | Fill #2

## 2018-10-29 MED FILL — HYDROCHLOROTHIAZIDE 12.5 MG: 12.5 | 30 days supply | Qty: 30 | Fill #1

## 2018-10-30 DIAGNOSIS — E1165 Type 2 diabetes mellitus with hyperglycemia: Secondary | ICD-10-CM | POA: Diagnosis not present

## 2018-11-24 DIAGNOSIS — E890 Postprocedural hypothyroidism: Secondary | ICD-10-CM | POA: Diagnosis not present

## 2018-11-24 DIAGNOSIS — Z5181 Encounter for therapeutic drug level monitoring: Secondary | ICD-10-CM | POA: Diagnosis not present

## 2018-11-24 DIAGNOSIS — Z85850 Personal history of malignant neoplasm of thyroid: Secondary | ICD-10-CM | POA: Diagnosis not present

## 2018-11-24 DIAGNOSIS — Z79899 Other long term (current) drug therapy: Secondary | ICD-10-CM | POA: Diagnosis not present

## 2018-11-24 DIAGNOSIS — E119 Type 2 diabetes mellitus without complications: Secondary | ICD-10-CM | POA: Diagnosis not present

## 2018-11-30 ENCOUNTER — Other Ambulatory Visit: Payer: Self-pay | Admitting: Neurology

## 2018-11-30 ENCOUNTER — Other Ambulatory Visit: Payer: Self-pay | Admitting: Physician Assistant

## 2018-11-30 ENCOUNTER — Other Ambulatory Visit: Payer: Self-pay | Admitting: Internal Medicine

## 2018-11-30 DIAGNOSIS — M545 Low back pain, unspecified: Secondary | ICD-10-CM

## 2018-11-30 DIAGNOSIS — J4550 Severe persistent asthma, uncomplicated: Secondary | ICD-10-CM

## 2018-11-30 DIAGNOSIS — E118 Type 2 diabetes mellitus with unspecified complications: Secondary | ICD-10-CM

## 2018-11-30 DIAGNOSIS — G8929 Other chronic pain: Secondary | ICD-10-CM

## 2018-11-30 DIAGNOSIS — Z794 Long term (current) use of insulin: Secondary | ICD-10-CM

## 2018-11-30 MED FILL — PRAZOSIN 1 MG CAPSULE: 1 | 30 days supply | Qty: 30 | Fill #0

## 2018-11-30 MED FILL — SYNTHROID 200 MCG TABLET: 200 | 30 days supply | Qty: 30 | Fill #3

## 2018-11-30 MED FILL — CITALOPRAM HBR 40 MG TABLET: 40 | 30 days supply | Qty: 30 | Fill #2

## 2018-11-30 MED FILL — buPROPion HCL ER (XL) 300 M: 300 | 30 days supply | Qty: 30 | Fill #2

## 2018-11-30 MED FILL — DOXEPIN 25 MG CAPSULE: 25 | 30 days supply | Qty: 30 | Fill #2

## 2018-11-30 MED FILL — ELIQUIS 5 MG TABLET: 5 | 30 days supply | Qty: 60 | Fill #5

## 2018-11-30 MED FILL — HYDROCHLOROTHIAZIDE 12.5 MG: 12.5 | 30 days supply | Qty: 30 | Fill #2

## 2018-11-30 MED FILL — TOPIRAMATE 200 MG TABLET: 200 | 30 days supply | Qty: 30 | Fill #0

## 2018-11-30 MED FILL — busPIRone HCL 5 MG TABS: 5 | 30 days supply | Qty: 90 | Fill #2

## 2018-12-01 ENCOUNTER — Telehealth: Payer: Self-pay

## 2018-12-01 ENCOUNTER — Ambulatory Visit: Payer: Medicaid Other | Admitting: Internal Medicine

## 2018-12-01 MED FILL — MONTELUKAST SOD 10 MG TAB: 10 | 30 days supply | Qty: 30 | Fill #0

## 2018-12-01 MED FILL — METHOCARBAMOL 500 MG TABLET: 500 | 13 days supply | Qty: 40 | Fill #0

## 2018-12-01 MED FILL — METOPROLOL TARTRATE 50 MG T: 50 | 30 days supply | Qty: 60 | Fill #0

## 2018-12-01 MED FILL — ATORVASTATIN 20 MG TABLET: 20 | 30 days supply | Qty: 30 | Fill #0

## 2018-12-01 NOTE — Telephone Encounter (Signed)
Contacted pt and lvm informing pt that Tramadol rx is ready for pickup at the front desk and if she has any questions or concerns to give me a call

## 2018-12-02 DIAGNOSIS — E1165 Type 2 diabetes mellitus with hyperglycemia: Secondary | ICD-10-CM | POA: Diagnosis not present

## 2018-12-02 MED FILL — GABAPENTIN 100 MG CAPSULE: 100 | 30 days supply | Qty: 90 | Fill #0

## 2018-12-04 ENCOUNTER — Encounter: Payer: Self-pay | Admitting: Internal Medicine

## 2018-12-04 ENCOUNTER — Other Ambulatory Visit: Payer: Self-pay

## 2018-12-04 ENCOUNTER — Ambulatory Visit: Payer: Medicaid Other | Attending: Internal Medicine | Admitting: Internal Medicine

## 2018-12-04 DIAGNOSIS — I10 Essential (primary) hypertension: Secondary | ICD-10-CM

## 2018-12-04 DIAGNOSIS — M5441 Lumbago with sciatica, right side: Secondary | ICD-10-CM

## 2018-12-04 DIAGNOSIS — J301 Allergic rhinitis due to pollen: Secondary | ICD-10-CM | POA: Diagnosis not present

## 2018-12-04 DIAGNOSIS — Z86718 Personal history of other venous thrombosis and embolism: Secondary | ICD-10-CM

## 2018-12-04 DIAGNOSIS — E890 Postprocedural hypothyroidism: Secondary | ICD-10-CM | POA: Diagnosis not present

## 2018-12-04 DIAGNOSIS — E278 Other specified disorders of adrenal gland: Secondary | ICD-10-CM | POA: Diagnosis not present

## 2018-12-04 DIAGNOSIS — G8929 Other chronic pain: Secondary | ICD-10-CM | POA: Diagnosis not present

## 2018-12-04 DIAGNOSIS — E118 Type 2 diabetes mellitus with unspecified complications: Secondary | ICD-10-CM

## 2018-12-04 MED FILL — traMADol HCL 50 MG TABS: 50 | 30 days supply | Qty: 30 | Fill #0

## 2018-12-04 NOTE — Progress Notes (Signed)
[place] to call in Tramadol 50MG  Rx. Spoke to [place]   Name: Tramadol Strength: 50MG  Sig: Take 1 tablet by mouth daily prn for moderate or severe pain  Quantity: 30 Refills: 1

## 2018-12-04 NOTE — Progress Notes (Signed)
Virtual Visit via Telephone Note Due to current restrictions/limitations of in-office visits due to the COVID-19 pandemic, this scheduled clinical appointment was converted to a telehealth visit  I connected with Melinda [name] on 12/04/18 at 11:07 a.m EDT by telephone and verified that I am speaking with the correct person using two identifiers. I am in my office.  The patient is at home.  Only the patient and myself participated in this encounter.  I discussed the limitations, risks, security and privacy concerns of performing an evaluation and management service by telephone and the availability of in person appointments. I also discussed with the patient that there [name] be a patient responsible charge related to this service. The patient expressed understanding and agreed to proceed.   History of Present Illness: Pt with hx ofDM(followed by Dr. Jana Hakim, moderate persistent asthma,migraines followed by neurology, uterine fibroids followed by GYN, papillary thyroid CA s/pthyroidectomy, history of DVT/PE onEliquislifetime,obesity, PTSD/anxiety.  Last seen 08/2018.  Purpose of today's visit is chronic disease management.  Referred to P.T on last visit to help her to maneuver steps.  Patient states she never received a call.  She is still interested in doing some physical therapy. No further falls  HYPERTENSION Currently taking: see medication list Med Adherence: [x]  Yes    []  No Medication side effects: []  Yes    [x]  No Adherence with salt restriction: [x]  Yes    []  No Home Monitoring?: [x]  Yes     Monitoring Frequency: 2 x/mth Home BP results range:  130/70 was last reading. Occasionally SBP gets about 130 SOB? [x]  Yes - had a little cough and SOB yesterday due to asthma.  Better after using the Albuterol and her humidifier Chest Pain?: []  Yes    [x]  No Leg swelling?: []  Yes    [x]  No Headaches?: []  Yes    [x]  No, no major flare of migraine Dizziness? []  Yes    [x]  No Comments:     DM/Hx of thyroid CA:  Since last visit, Dr. Buddy Duty d/c Metformin.  She is now managing diabetes with diet only. Checking BS BID - range has been 90s to low 100s Reports A1C was in pre-DM range on recent blood test done by endocrine.  B12 was nl Eating habits:  Still on plant base diet as much as she can Not getting out to walk because of increase pollen that bothers her.  Uses a pedal device which she uses when sitting down.  She takes Claritin; does not tolerate steriod nasal spray.   -has appt with Dr. Schuyler Amor later this mth 12/15/2018 -reports is not thyroid tests were normal  Hx DVT/PE:  No bruising or bleeding on Eliquis   Chronic Lower back pain with sciatica:  Had some flareup of lower back pain into the right hip.  No initiating factor.  She had called for refill on tramadol which she uses only when she needs to.  She is tolerating the tramadol okay without any major side effects.  Right adrenal mass: Initially seen on CT back in 2017.  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 ACCU-CHEK AVIVA PLUS test strip USE TO TEST BLOOD GLUCOSE FOUR TIMES DAILY BEFORE MEALS AND AT BEDTIME (Patient not taking: Reported on 12/04/2018)  . ACCU-CHEK SOFTCLIX LANCETS lancets   . albuterol (PROVENTIL) (2.5 MG/3ML) 0.083% nebulizer solution TAKE  3 MLS (2.5 MG TOTAL) BY NEBULIZATION EVERY 6 HOURS AS NEEDED FORWHEEZING OR SHORTNESS OF BREATH (Patient taking differently: Take 2.5 mg by nebulization every 6 (six) hours as needed for wheezing or shortness of breath. )  . apixaban (ELIQUIS) 5 MG TABS tablet Take 1 tablet (5 mg total) by mouth 2 (two) times daily.  Marland Kitchen atorvastatin (LIPITOR) 20 MG tablet TAKE 1 TABLET BY MOUTH ONCE A DAY  . budesonide-formoterol (SYMBICORT) 160-4.5 MCG/ACT inhaler Inhale 2 puffs into the lungs 2 (two) times daily.  Marland Kitchen buPROPion (WELLBUTRIN  XL) 300 MG 24 hr tablet Take 300 mg by mouth daily.   . busPIRone (BUSPAR) 5 MG tablet Take 5 mg by mouth 3 (three) times daily.  . citalopram (CELEXA) 40 MG tablet Take 40 mg by mouth daily.   Marland Kitchen doxepin (SINEQUAN) 10 MG capsule Take 10 mg by mouth.  . eletriptan (RELPAX) 40 MG tablet Take 1 tablet (40 mg total) by mouth as needed for migraine or headache. [name] repeat in 2 hours if headache persists or recurs. (Patient not taking: Reported on 05/27/2018)  . fluticasone (CUTIVATE) 0.05 % cream Apply topically 2 (two) times daily.  . fluticasone (FLONASE) 50 MCG/ACT nasal spray INSTILL 2 SPRAYS INTO BOTH NOSTRILS DAILY  . Fremanezumab-vfrm (AJOVY) 225 MG/1.5ML SOSY Inject 225 mg into the skin every 30 (thirty) days. (Patient not taking: Reported on 05/27/2018)  . gabapentin (NEURONTIN) 100 MG capsule TAKE 1 CAPSULE (100 MG TOTAL) BY MOUTH 3 (THREE) TIMES DAILY.  . hydrochlorothiazide (MICROZIDE) 12.5 MG capsule TAKE 1 CAPSULE BY MOUTH ONCE DAILY  . levothyroxine (SYNTHROID, LEVOTHROID) 175 MCG tablet Take 1 tablet (175 mcg total) by mouth daily. (Patient taking differently: Take 200 mcg by mouth daily. )  . loratadine (CLARITIN) 10 MG tablet Take 1 tablet (10 mg total) by mouth daily.  . metFORMIN (GLUCOPHAGE) 850 MG tablet TAKE 1 TABLET BY MOUTH TWICE DAILY WITH MEALS (Patient not taking: Reported on 12/04/2018)  . methocarbamol (ROBAXIN) 500 MG tablet TAKE 1 TABLET BY MOUTH EVERY 8 HOURS AS NEEDED FOR MUSCLE SPASMS  . metoprolol tartrate (LOPRESSOR) 50 MG tablet TAKE 1 TABLET BY MOUTH TWO TIMES DAILY  . montelukast (SINGULAIR) 10 MG tablet TAKE 1 TABLET BY MOUTH EVERY EVENING AT BEDTIME  . prazosin (MINIPRESS) 1 MG capsule Take 1 mg by mouth at bedtime.  . topiramate (TOPAMAX) 200 MG tablet TAKE 1 TABLET BY MOUTH AT BEDTIME  . topiramate (TOPAMAX) 50 MG tablet Take 3 tablets at bedtime for 7 days, then increase to 4 tablets at bedtime.  Contact us for refill  . traMADol (ULTRAM) 50 MG tablet TAKE 1  TABLET BY MOUTH DAILY AS NEEDED FOR MODERATE OR SEVERE PAIN.  Marland Kitchen UNIFINE PENTIPS 32G X 4 MM MISC    No facility-administered encounter medications on file as of 12/04/2018.       Observations/Objective: No direct observation done as this was a telephone encounter.   Assessment and Plan: 1. Essential hypertension Reported BP readings are good.  She will continue current medications to include metoprolol, hydrochlorothiazide,  2. Controlled type 2 diabetes mellitus with complication, without long-term current use of insulin ([facility]) Currently diet controlled.  Encourage her to continue healthy eating habits.  Encouraged her to be more active as much as she is able to  3. Seasonal allergic rhinitis due to pollen Continue Claritin.  She does not tolerate steroid nasal inhalers as they make her migraines worse  4. Chronic right-sided low back pain with right-sided sciatica  Recommend use of a heating pad Refill called into a pharmacy for tramadol which she tolerates well without major side effects.  Controlled substance prescribing agreement up-to-date  5. Right adrenal mass ([facility]) Stable based on imaging done last year  6. Postoperative hypothyroidism Followed by endocrinology  7. History of DVT in adulthood Continue Eliquis.  CBC on follow-up visit   Follow Up Instructions: F/u in 3 mths   I discussed the assessment and treatment plan with the patient. The patient was provided an opportunity to ask questions and all were answered. The patient agreed with the plan and demonstrated an understanding of the instructions.   The patient was advised to call back or seek an in-person evaluation if the symptoms worsen or if the condition fails to improve as anticipated.  I provided 18 minutes of non-face-to-face time during this encounter.   [name], MD

## 2018-12-15 DIAGNOSIS — H40013 Open angle with borderline findings, low risk, bilateral: Secondary | ICD-10-CM | POA: Diagnosis not present

## 2018-12-15 DIAGNOSIS — H1132 Conjunctival hemorrhage, left eye: Secondary | ICD-10-CM | POA: Diagnosis not present

## 2018-12-15 LAB — HM DIABETES EYE EXAM

## 2018-12-18 DIAGNOSIS — F411 Generalized anxiety disorder: Secondary | ICD-10-CM | POA: Diagnosis not present

## 2018-12-18 DIAGNOSIS — F4310 Post-traumatic stress disorder, unspecified: Secondary | ICD-10-CM | POA: Diagnosis not present

## 2018-12-18 DIAGNOSIS — F331 Major depressive disorder, recurrent, moderate: Secondary | ICD-10-CM | POA: Diagnosis not present

## 2018-12-30 DIAGNOSIS — E1165 Type 2 diabetes mellitus with hyperglycemia: Secondary | ICD-10-CM | POA: Diagnosis not present

## 2018-12-31 ENCOUNTER — Other Ambulatory Visit: Payer: Self-pay | Admitting: Internal Medicine

## 2018-12-31 DIAGNOSIS — I10 Essential (primary) hypertension: Secondary | ICD-10-CM

## 2018-12-31 MED FILL — HYDROCHLOROTHIAZIDE 12.5 MG: 12.5 | 30 days supply | Qty: 30 | Fill #0

## 2018-12-31 MED FILL — GABAPENTIN 100 MG CAPSULE: 100 | 30 days supply | Qty: 90 | Fill #1

## 2018-12-31 MED FILL — ELIQUIS 5 MG TABLET: 5 | 30 days supply | Qty: 60 | Fill #6

## 2018-12-31 MED FILL — buPROPion HCL ER (XL) 300 M: 300 | 30 days supply | Qty: 30 | Fill #0

## 2018-12-31 MED FILL — MONTELUKAST SOD 10 MG TAB: 10 | 30 days supply | Qty: 30 | Fill #1

## 2018-12-31 MED FILL — traMADol HCL 50 MG TABS: 50 | 30 days supply | Qty: 30 | Fill #1

## 2018-12-31 MED FILL — busPIRone HCL 5 MG TABS: 5 | 30 days supply | Qty: 90 | Fill #0

## 2018-12-31 MED FILL — TOPIRAMATE 200 MG TABLET: 200 | 30 days supply | Qty: 30 | Fill #1

## 2018-12-31 MED FILL — CITALOPRAM HBR 40 MG TABLET: 40 | 30 days supply | Qty: 30 | Fill #0

## 2018-12-31 MED FILL — DOXEPIN 25 MG CAPSULE: 25 | 30 days supply | Qty: 30 | Fill #0

## 2018-12-31 MED FILL — SYNTHROID 200 MCG TABLET: 200 | 30 days supply | Qty: 30 | Fill #4

## 2018-12-31 MED FILL — METOPROLOL TARTRATE 50 MG T: 50 | 30 days supply | Qty: 60 | Fill #1

## 2018-12-31 MED FILL — ATORVASTATIN 20 MG TABLET: 20 | 30 days supply | Qty: 30 | Fill #1

## 2018-12-31 MED FILL — METHOCARBAMOL 500 MG TABS: 500 | 13 days supply | Qty: 40 | Fill #1

## 2019-01-01 MED FILL — PRAZOSIN HCL 1 MG CAPS: 1 | 30 days supply | Qty: 30 | Fill #1

## 2019-01-13 ENCOUNTER — Telehealth: Payer: Self-pay | Admitting: Internal Medicine

## 2019-01-13 NOTE — Telephone Encounter (Signed)
Will forward to pcp and front desk to schedule pt a televisit

## 2019-01-13 NOTE — Telephone Encounter (Signed)
Patient called stating she was told by her psychiatric  to follow up with PCP in order to get a sleep study done please follow up.

## 2019-01-14 NOTE — Telephone Encounter (Signed)
Please call pt and schedule and a televisit

## 2019-01-25 ENCOUNTER — Ambulatory Visit: Payer: Medicaid Other | Attending: Internal Medicine | Admitting: Internal Medicine

## 2019-01-25 ENCOUNTER — Encounter: Payer: Self-pay | Admitting: Internal Medicine

## 2019-01-25 ENCOUNTER — Other Ambulatory Visit: Payer: Self-pay

## 2019-01-25 DIAGNOSIS — R400 Somnolence: Secondary | ICD-10-CM | POA: Diagnosis not present

## 2019-01-25 DIAGNOSIS — R0683 Snoring: Secondary | ICD-10-CM

## 2019-01-25 NOTE — Progress Notes (Signed)
Virtual Visit via Telephone Note Due to current restrictions/limitations of in-office visits due to the COVID-19 pandemic, this scheduled clinical appointment was converted to a telehealth visit  I connected with Melinda [name] on 01/25/19 at 3:29 p.m by telephone and verified that I am speaking with the correct person using two identifiers. I am in my office.  The patient is at home.  Only the patient and myself participated in this encounter.  I discussed the limitations, risks, security and privacy concerns of performing an evaluation and management service by telephone and the availability of in person appointments. I also discussed with the patient that there [name] be a patient responsible charge related to this service. The patient expressed understanding and agreed to proceed.   History of Present Illness: Pt with hx ofDM(followed by Dr. Jana Hakim, moderate persistent asthma,migraines followed by neurology, uterine fibroids followed by GYN, papillary thyroid CA s/pthyroidectomy, history of DVT/PE onEliquislifetime,obesity, PTSD/anxiety.   Patient last evaluated 12/04/2018.  Today's an urgent care visit.  Pt's psychiatrist suggested that she has a sleep study done -pt reports un-refresh sleep when she wakes in the mornings, feels sleepy during the day.  Naps during the day several times a week due to daytime sleepiness.  Told by son that she snores loud.  She sometimes has morning headaches. -does not drive  Outpatient Encounter Medications as of 01/25/2019  Medication Sig  . ACCU-CHEK AVIVA PLUS test strip USE TO TEST BLOOD GLUCOSE FOUR TIMES DAILY BEFORE MEALS AND AT BEDTIME  . ACCU-CHEK SOFTCLIX LANCETS lancets   . albuterol (PROVENTIL) (2.5 MG/3ML) 0.083% nebulizer solution TAKE 3 MLS (2.5 MG TOTAL) BY NEBULIZATION EVERY 6 HOURS AS NEEDED FORWHEEZING OR SHORTNESS OF BREATH (Patient taking differently: Take 2.5 mg by nebulization every 6 (six) hours as needed for wheezing or shortness of  breath. )  . apixaban (ELIQUIS) 5 MG TABS tablet Take 1 tablet (5 mg total) by mouth 2 (two) times daily.  Marland Kitchen atorvastatin (LIPITOR) 20 MG tablet TAKE 1 TABLET BY MOUTH ONCE A DAY  . budesonide-formoterol (SYMBICORT) 160-4.5 MCG/ACT inhaler Inhale 2 puffs into the lungs 2 (two) times daily.  Marland Kitchen buPROPion (WELLBUTRIN XL) 300 MG 24 hr tablet Take 300 mg by mouth daily.   . busPIRone (BUSPAR) 5 MG tablet Take 5 mg by mouth 3 (three) times daily.  . citalopram (CELEXA) 40 MG tablet Take 40 mg by mouth daily.   Marland Kitchen doxepin (SINEQUAN) 10 MG capsule Take 10 mg by mouth.  . fluticasone (CUTIVATE) 0.05 % cream Apply topically 2 (two) times daily.  Marland Kitchen gabapentin (NEURONTIN) 100 MG capsule TAKE 1 CAPSULE (100 MG TOTAL) BY MOUTH 3 (THREE) TIMES DAILY.  . hydrochlorothiazide (MICROZIDE) 12.5 MG capsule TAKE 1 CAPSULE BY MOUTH ONCE DAILY  . loratadine (CLARITIN) 10 MG tablet Take 1 tablet (10 mg total) by mouth daily.  . methocarbamol (ROBAXIN) 500 MG tablet TAKE 1 TABLET BY MOUTH EVERY 8 HOURS AS NEEDED FOR MUSCLE SPASMS  . metoprolol tartrate (LOPRESSOR) 50 MG tablet TAKE 1 TABLET BY MOUTH TWO TIMES DAILY  . montelukast (SINGULAIR) 10 MG tablet TAKE 1 TABLET BY MOUTH EVERY EVENING AT BEDTIME  . prazosin (MINIPRESS) 1 MG capsule Take 1 mg by mouth at bedtime.  Marland Kitchen SYNTHROID 200 MCG tablet   . topiramate (TOPAMAX) 200 MG tablet TAKE 1 TABLET BY MOUTH AT BEDTIME  . traMADol (ULTRAM) 50 MG tablet TAKE 1 TABLET BY MOUTH DAILY AS NEEDED FOR MODERATE OR SEVERE PAIN.  Marland Kitchen UNIFINE PENTIPS 32G X 4  MM MISC   . eletriptan (RELPAX) 40 MG tablet Take 1 tablet (40 mg total) by mouth as needed for migraine or headache. [name] repeat in 2 hours if headache persists or recurs. (Patient not taking: Reported on 05/27/2018)  . fluticasone (FLONASE) 50 MCG/ACT nasal spray INSTILL 2 SPRAYS INTO BOTH NOSTRILS DAILY (Patient not taking: Reported on 01/25/2019)  . Fremanezumab-vfrm (AJOVY) 225 MG/1.5ML SOSY Inject 225 mg into the skin every 30  (thirty) days. (Patient not taking: Reported on 05/27/2018)  . topiramate (TOPAMAX) 50 MG tablet Take 3 tablets at bedtime for 7 days, then increase to 4 tablets at bedtime.  Contact us for refill (Patient not taking: Reported on 01/25/2019)   No facility-administered encounter medications on file as of 01/25/2019.       Observations/Objective: No direct observation done as this was a telephone encounter  Assessment and Plan: 1. Loud snoring History suggest possible sleep apnea.  I discussed what his sleep apnea, how is it diagnosed and how is it treated and how untreated sleep apnea can affect oneself.  Patient is agreeable to having a sleep study done.  Patient told that she will need to have COVID-19 testing done prior to going to the sleep lab.  I have put in this order as a future lab. Encourage weight loss. - [facility] Sleep Study; Future - Novel Coronavirus, NAA ([facility]); Future   Follow Up Instructions: As previously scheduled in September for chronic disease management   I discussed the assessment and treatment plan with the patient. The patient was provided an opportunity to ask questions and all were answered. The patient agreed with the plan and demonstrated an understanding of the instructions.   The patient was advised to call back or seek an in-person evaluation if the symptoms worsen or if the condition fails to improve as anticipated.  I provided 33minutes of non-face-to-face time during this encounter.   [name], MD

## 2019-01-25 NOTE — Progress Notes (Signed)
Per pt her psychiatrist wants her to have a sleep study.   Lower back pain she woke up with that started today.

## 2019-01-29 ENCOUNTER — Other Ambulatory Visit: Payer: Self-pay | Admitting: Internal Medicine

## 2019-01-29 MED FILL — MONTELUKAST SOD 10 MG TAB: 10 | 30 days supply | Qty: 30 | Fill #2

## 2019-01-29 MED FILL — GABAPENTIN 100 MG CAPSULE: 100 | 30 days supply | Qty: 90 | Fill #2

## 2019-01-29 MED FILL — busPIRone HCL 5 MG TABS: 5 | 30 days supply | Qty: 90 | Fill #1

## 2019-01-29 MED FILL — PRAZOSIN HCL 1 MG CAPS: 1 | 30 days supply | Qty: 30 | Fill #2

## 2019-01-29 MED FILL — SYNTHROID 200 MCG TABLET: 200 | 30 days supply | Qty: 30 | Fill #5

## 2019-01-29 MED FILL — TOPIRAMATE 200 MG TABLET: 200 | 30 days supply | Qty: 30 | Fill #2

## 2019-01-29 MED FILL — METOPROLOL TARTRATE 50 MG T: 50 | 30 days supply | Qty: 60 | Fill #2

## 2019-01-29 MED FILL — DOXEPIN HCL 25 MG CAPS: 25 | 30 days supply | Qty: 30 | Fill #1

## 2019-01-29 MED FILL — CITALOPRAM HBR 40 MG TABLET: 40 | 30 days supply | Qty: 30 | Fill #1

## 2019-01-29 MED FILL — HYDROCHLOROTHIAZIDE 12.5 MG: 12.5 | 30 days supply | Qty: 30 | Fill #1

## 2019-01-29 MED FILL — ATORVASTATIN 20 MG TABLET: 20 | 30 days supply | Qty: 30 | Fill #2

## 2019-01-29 MED FILL — buPROPion HCL ER (XL) 300 M: 300 | 30 days supply | Qty: 30 | Fill #1

## 2019-01-30 DIAGNOSIS — E1165 Type 2 diabetes mellitus with hyperglycemia: Secondary | ICD-10-CM | POA: Diagnosis not present

## 2019-02-01 MED FILL — ELIQUIS 5 MG TABLET: 5 | 30 days supply | Qty: 60 | Fill #0

## 2019-02-08 ENCOUNTER — Other Ambulatory Visit (HOSPITAL_COMMUNITY)
Admission: RE | Admit: 2019-02-08 | Discharge: 2019-02-08 | Disposition: A | Discharge status: Home / Self Care | Payer: Medicaid Other | Source: Ambulatory Visit | Attending: Internal Medicine | Admitting: Internal Medicine

## 2019-02-08 DIAGNOSIS — Z01812 Encounter for preprocedural laboratory examination: Secondary | ICD-10-CM | POA: Diagnosis not present

## 2019-02-08 DIAGNOSIS — Z20828 Contact with and (suspected) exposure to other viral communicable diseases: Secondary | ICD-10-CM | POA: Diagnosis not present

## 2019-02-08 LAB — SARS CORONAVIRUS 2 (TAT 6-24 HRS): SARS Coronavirus 2: NEGATIVE

## 2019-02-11 ENCOUNTER — Ambulatory Visit (HOSPITAL_BASED_OUTPATIENT_CLINIC_OR_DEPARTMENT_OTHER): Payer: Medicaid Other | Attending: Internal Medicine | Admitting: Internal Medicine

## 2019-02-11 ENCOUNTER — Other Ambulatory Visit: Payer: Self-pay

## 2019-02-11 DIAGNOSIS — R0683 Snoring: Secondary | ICD-10-CM | POA: Diagnosis not present

## 2019-02-11 DIAGNOSIS — Z6841 Body Mass Index (BMI) 40.0 and over, adult: Secondary | ICD-10-CM | POA: Diagnosis not present

## 2019-02-11 DIAGNOSIS — I10 Essential (primary) hypertension: Secondary | ICD-10-CM | POA: Diagnosis not present

## 2019-02-11 DIAGNOSIS — J449 Chronic obstructive pulmonary disease, unspecified: Secondary | ICD-10-CM | POA: Diagnosis not present

## 2019-02-14 DIAGNOSIS — R0683 Snoring: Secondary | ICD-10-CM | POA: Diagnosis not present

## 2019-02-14 NOTE — Progress Notes (Signed)
Virtual Visit via Video Note The purpose of this virtual visit is to provide medical care while limiting exposure to the novel coronavirus.    Consent was obtained for video visit:  Yes Answered questions that patient had about telehealth interaction:  Yes I discussed the limitations, risks, security and privacy concerns of performing an evaluation and management service by telemedicine. I also discussed with the patient that there may be a patient responsible charge related to this service. The patient expressed understanding and agreed to proceed.  Pt location: Home Physician Location: Home Name of referring provider:  [name], MD I connected with [name] at patients initiation/request on 02/15/2019 at  8:50 AM EDT by video enabled telemedicine application and verified that I am speaking with the correct person using two identifiers. Pt MRN:  [id number] Pt DOB:  1966-02-03 Video Participants:  [name]   History of Present Illness:  Melinda [name] is a 53 year old right-handed African-American woman with type 2 diabetes mellitus, glaucoma, asthma, chronic low back pain, PTSD, PE/DVT and history of thyroid cancer status post thyroidectomy who follows up for migraine.  UPDATE: Sleep particularly poor, which is affecting her headaches. Intensity:  Moderate Duration:  1 hour Frequency:  3 or 4 days per month Frequency of abortive medication:Has not been treating migraines. Current NSAIDS:Contraindicated (on anticoagulation) Current analgesics:Acetaminophen, tramadol (ran out) Current triptans:none Current ergotamine:none Current anti-emetic:Zofran ODT 4mg  Current muscle relaxants:methocarbamol Current anti-anxiolytic:none Current sleep aide:none Current Antihypertensive medications:Metoprolol 50mg  twice daily, HCTZ Current Antidepressant medications:Wellbutrin 150mg , citalopram 10mg  Current Anticonvulsant medications:topiramate 200 mg at  bedtime, gabapentin 100mg  three times daily Current anti-CGRP:None Current Vitamins/Herbal/Supplements:Ferrous sulfate Current Antihistamines/Decongestants:Benadryl Other therapy:Essential oils, lavender  Sees psychiatrist and psychologist which helps  Caffeine:No Alcohol:No Smoker:No Diet:Hydrates. No soda. Eats baked and boiled chicken, mostly vegetables.  Little red meat only during menses. No fried foods. Exercise:Stationary bike, limited due to chronic pain Depression:Yes; Anxiety:Yes Other pain:Chronic pain Sleep hygiene:Poor. Has night terrors secondary to PTSD.  Sleep study last week showed no sleep apnea.    HISTORY: Onset:  Since her 22s Location: Right sided Quality: squeezing Initial Intensity: 8.5/10 (sometimes 10/10) Aura: Sometimes preceded with visual aura (sees a pinwheel) Prodrome: no Associated symptoms: Nausea, photophobia, phonophobia. No worse headache of life, change in quality, wakes her up from sleep. Initial Duration: 3 hours to all day; May: 45 minutes with Maxalt (reduces intensity to manageable 4/10 for another couple of hours) Initial Frequency: daily; May: 5 to 10 days per month in a cluster during her cycle Triggers:  Apples, aged cheese, menstrual cycle Relieving factors:  None Activity: Light activity aggravates  Past NSAIDS: ibuprofen Past analgesics: Excedrin Past abortive triptans: Sumatriptan tablet/NS/Hiller, rizatriptan 10mg  Past muscle relaxants: no Past anti-emetic: no Past antihypertensive medications: no Past antidepressant medications: amitriptyline 50mg  Past anticonvulsant medications: no Past vitamins/Herbal/Supplements: no Past antihistamines/decongestants: no Other past therapies: no  Family history of headache: Son has migraines.  CT of head from 01/16/15 was unremarkable.  In September 2018, she developed a "thunderclap" headache. Onset was over a month ago. It lasts for a  couple of seconds and occurs once a week. She has not had any vision loss, unilateral numbness or weakness or vertigo.  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  Past Medical History: Past Medical History:  Diagnosis Date  . ADHD (attention deficit hyperactivity disorder)   . Arthritis   .  Asthma   . Cancer ([place])   . Chicken pox AGE 12  . Diabetes mellitus without complication ([place])   . GERD (gastroesophageal reflux disease)   . Glaucoma    BOTH EYES, NO EYE DROPS  . Glaucoma   . History of blood transfusion MARCH 2016    2UNITS GIVEN AND IRON GIVEN  . Hyperglycemia 09/09/2014  . Hypertension   . 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 Iron deficiency anemia due to chronic blood loss 09/09/2014  . Low TSH level 09/08/2014  . Lumbar herniated disc   . Menorrhagia 09/08/2014  . Migraine    CLUSTER AND MIGRAINES  . Multiple thyroid nodules   . Ovarian mass, right 09/09/2014  . PE (pulmonary embolism) 2016  . Peripheral neuropathy    FINGER TIPS AND TOES NUMB SOME  . Preeclampsia  1983   WITH PREGNANCY  . PTSD (post-traumatic stress disorder)   . Scoliosis   . Seizures ([place]) AGE 12   NONE SINCE, HAD CHICKEN POX THEN    Medications: Outpatient Encounter Medications as of 02/15/2019  Medication Sig  . ACCU-CHEK AVIVA PLUS test strip USE TO TEST BLOOD GLUCOSE FOUR TIMES DAILY BEFORE MEALS AND AT BEDTIME  . ACCU-CHEK SOFTCLIX LANCETS lancets   . albuterol (PROVENTIL) (2.5 MG/3ML) 0.083% nebulizer solution TAKE 3 MLS (2.5 MG TOTAL) BY NEBULIZATION EVERY 6 HOURS AS NEEDED FORWHEEZING OR SHORTNESS OF BREATH (Patient taking differently: Take 2.5 mg by nebulization every 6 (six) hours as needed for wheezing or shortness of breath. )  . atorvastatin (LIPITOR) 20 MG tablet TAKE 1  TABLET BY MOUTH ONCE A DAY  . budesonide-formoterol (SYMBICORT) 160-4.5 MCG/ACT inhaler Inhale 2 puffs into the lungs 2 (two) times daily.  Marland Kitchen buPROPion (WELLBUTRIN XL) 300 MG 24 hr tablet Take 300 mg by mouth daily.   . busPIRone (BUSPAR) 5 MG tablet Take 5 mg by mouth 3 (three) times daily.  . citalopram (CELEXA) 40 MG tablet Take 40 mg by mouth daily.   Marland Kitchen doxepin (SINEQUAN) 10 MG capsule Take 10 mg by mouth.  . eletriptan (RELPAX) 40 MG tablet Take 1 tablet (40 mg total) by mouth as needed for migraine or headache. May repeat in 2 hours if headache persists or recurs. (Patient not taking: Reported on 05/27/2018)  . ELIQUIS 5 MG TABS tablet TAKE 1 TABLET BY MOUTH 2 TIMES DAILY.  . fluticasone (CUTIVATE) 0.05 % cream Apply topically 2 (two) times daily.  . fluticasone (FLONASE) 50 MCG/ACT nasal spray INSTILL 2 SPRAYS INTO BOTH NOSTRILS DAILY (Patient not taking: Reported on 01/25/2019)  . Fremanezumab-vfrm (AJOVY) 225 MG/1.5ML SOSY Inject 225 mg into the skin every 30 (thirty) days. (Patient not taking: Reported on 05/27/2018)  . gabapentin (NEURONTIN) 100 MG capsule TAKE 1 CAPSULE (100 MG TOTAL) BY MOUTH 3 (THREE) TIMES DAILY.  . hydrochlorothiazide (MICROZIDE) 12.5 MG capsule TAKE 1 CAPSULE BY MOUTH ONCE DAILY  . loratadine (CLARITIN) 10 MG tablet Take 1 tablet (10 mg total) by mouth daily.  . methocarbamol (ROBAXIN) 500 MG tablet TAKE 1 TABLET BY MOUTH EVERY 8 HOURS AS NEEDED FOR MUSCLE SPASMS  . metoprolol tartrate (LOPRESSOR) 50 MG tablet TAKE 1 TABLET BY MOUTH TWO TIMES DAILY  . montelukast (SINGULAIR) 10 MG tablet TAKE 1 TABLET BY MOUTH EVERY EVENING AT BEDTIME  . prazosin (MINIPRESS) 1 MG capsule Take 1 mg by mouth at bedtime.  Marland Kitchen SYNTHROID 200 MCG  tablet   . topiramate (TOPAMAX) 200 MG tablet TAKE 1 TABLET BY MOUTH AT BEDTIME  . topiramate (TOPAMAX) 50 MG tablet Take 3 tablets at bedtime for 7 days, then increase to 4 tablets at bedtime.  Contact us for refill (Patient not taking: Reported  on 01/25/2019)  . traMADol (ULTRAM) 50 MG tablet TAKE 1 TABLET BY MOUTH DAILY AS NEEDED FOR MODERATE OR SEVERE PAIN.  Marland Kitchen UNIFINE PENTIPS 32G X 4 MM MISC    No facility-administered encounter medications on file as of 02/15/2019.     Allergies: Allergies  Allergen Reactions  . Ceftriaxone Anaphylaxis    ROCEPHIN  . Penicillins Shortness Of Breath    Has patient had a PCN reaction causing immediate rash, facial/tongue/throat swelling, SOB or lightheadedness with hypotension: Yes Has patient had a PCN reaction causing severe rash involving mucus membranes or skin necrosis: No Has patient had a PCN reaction that required hospitalization No Has patient had a PCN reaction occurring within the last 10 years: No If all of the above answers are "NO", then may proceed with Cephalosporin use.   . Shellfish Allergy Anaphylaxis  . Flonase [Fluticasone Propionate]     Makes migraines worse  . Gold-Containing Drug Products     HANDS ITCH  . Nickel     HANDS SWELL  . Citrus Rash    Family History: Family History  Problem Relation Age of Onset  . Diabetes Mother   . Breast cancer Mother   . CAD Mother   . Hypertension Mother   . Alcohol abuse Father   . Hypertension Father   . Breast cancer Maternal Aunt   . Breast cancer Maternal Aunt     Social History: Social History   Socioeconomic History  . Marital status: Single    Spouse name: Not on file  . Number of children: Not on file  . Years of education: Not on file  . Highest education level: Not on file  Occupational History  . Not on file  Social Needs  . Financial resource strain: Not on file  . Food insecurity    Worry: Not on file    Inability: Not on file  . Transportation needs    Medical: Not on file    Non-medical: Not on file  Tobacco Use  . Smoking status: Former Smoker    Packs/day: 0.50    Years: 40.00    Pack years: 20.00    Types: Cigarettes    Quit date: 11/03/2014    Years since quitting: 4.2  .  Smokeless tobacco: Never Used  Substance and Sexual Activity  . Alcohol use: No    Alcohol/week: 0.0 standard drinks  . Drug use: No  . Sexual activity: Never    Birth control/protection: None  Lifestyle  . Physical activity    Days per week: Not on file    Minutes per session: Not on file  . Stress: Not on file  Relationships  . Social Herbalist on phone: Not on file    Gets together: Not on file    Attends religious service: Not on file    Active member of club or organization: Not on file    Attends meetings of clubs or organizations: Not on file    Relationship status: Not on file  . Intimate partner violence    Fear of current or ex partner: Not on file    Emotionally abused: Not on file    Physically abused: Not on  file    Forced sexual activity: Not on file  Other Topics Concern  . Not on file  Social History Narrative  . Not on file    Observations/Objective:   Blood pressure 118/65, height 5\' 9"  (1.753 m), weight 300 lb (136.1 kg). No acute distress.  Alert and oriented.  Speech fluent and not dysarthric.  Language intact.  Eyes orthophoric on primary gaze.  Face symmetric.  Assessment and Plan:   Migraine without aura, without status migrainosus, not intractable History of cerebral aneurysm.  1.  For preventative management, topiramate 200mg  at bedtime 2.  For abortive therapy, acetaminophen 3.  Limit use of pain relievers to no more than 2 days out of week to prevent risk of rebound or medication-overuse headache. 4.  Keep headache diary 5.  Exercise, hydration, caffeine cessation, sleep hygiene, monitor for and avoid triggers 6.  Consider:  magnesium citrate 400mg  daily, riboflavin 400mg  daily, and coenzyme Q10 100mg  three times daily 7. Always keep in mind that currently taking a hormone or birth control may be a possible trigger or aggravating factor for migraine. 8. We will follow up regarding possible aneurysm.  CTA of head. 9. Follow up 6  months.   Follow Up Instructions:    -I discussed the assessment and treatment plan with the patient. The patient was provided an opportunity to ask questions and all were answered. The patient agreed with the plan and demonstrated an understanding of the instructions.   The patient was advised to call back or seek an in-person evaluation if the symptoms worsen or if the condition fails to improve as anticipated.  Dudley Major, DO

## 2019-02-14 NOTE — Procedures (Signed)
    Patient Name: Melinda [name], Melinda [name] Date: 02/11/2019 Gender: Female D.O.B: 1966-03-19 Age (years): 66 Referring Provider: [name] MD Height (inches): 17 Interpreting Physician: Baird Lyons MD, ABSM Weight (lbs): 296 RPSGT: Zadie Rhine BMI: 41 MRN: [id number] Neck Size: 15.50  CLINICAL INFORMATION Sleep Study Type: NPS Indication for sleep study: COPD, Hypertension, Morbid Obesity, Snoring Epworth Sleepiness Score: 11  SLEEP STUDY TECHNIQUE As per the AASM Manual for the Scoring of Sleep and Associated Events v2.3 (April 2016) with a hypopnea requiring 4% desaturations.  The channels recorded and monitored were frontal, central and occipital EEG, electrooculogram (EOG), submentalis EMG (chin), nasal and oral airflow, thoracic and abdominal wall motion, anterior tibialis EMG, snore microphone, electrocardiogram, and pulse oximetry.  MEDICATIONS Medications self-administered by patient taken the night of the study : none reported  SLEEP ARCHITECTURE The study was initiated at 10:17:37 PM and ended at 4:54:49 AM.  Sleep onset time was 7.5 minutes and the sleep efficiency was 93.8%%. The total sleep time was 372.7 minutes.  Stage REM latency was 123.5 minutes.  The patient spent 2.0%% of the night in stage N1 sleep, 63.2%% in stage N2 sleep, 6.0%% in stage N3 and 28.8% in REM.  Alpha intrusion was absent.  Supine sleep was 100.00%.  RESPIRATORY PARAMETERS The overall apnea/hypopnea index (AHI) was 1.8 per hour. There were 1 total apneas, including 1 obstructive, 0 central and 0 mixed apneas. There were 10 hypopneas and 1 RERAs.  The AHI during Stage REM sleep was 5.0 per hour.  AHI while supine was 1.8 per hour.  The mean oxygen saturation was 95.1%. The minimum SpO2 during sleep was 87.0%.  loud snoring was noted during this study.  CARDIAC DATA The 2 lead EKG demonstrated sinus rhythm. The mean heart rate was 66.4 beats per minute. Other EKG findings  include: None.  LEG MOVEMENT DATA The total PLMS were 0 with a resulting PLMS index of 0.0. Associated arousal with leg movement index was 2.4 .  IMPRESSIONS - No significant obstructive sleep apnea occurred during this study (AHI = 1.8/h). - No significant central sleep apnea occurred during this study (CAI = 0.0/h). - Mild oxygen desaturation was noted during this study (Min O2 = 87.0%). Mean sat 95.1% on room air. - Time with saturation 88% or less was 0.2 minutes. - The patient snored with loud snoring volume. - No cardiac abnormalities were noted during this study. - Clinically significant periodic limb movements did not occur during sleep. No significant associated arousals.  DIAGNOSIS - Primary snoring  RECOMMENDATIONS - Manage for symptoms and snoring based on clinical judgment. - Be careful with alcohol, sedatives and other CNS depressants that may worsen sleep apnea and disrupt normal sleep architecture. - Sleep hygiene should be reviewed to assess factors that may improve sleep quality. - Weight management and regular exercise should be initiated or continued if appropriate.  [Electronically signed] 02/14/2019 11:29 AM  Baird Lyons MD, ABSM Diplomate, American Board of Sleep Medicine   NPI: [id number]                         [facility] [place] [facility]  ELECTRONICALLY SIGNED ON:  02/14/2019, 11:27 AM [place] PH: [phone]   FX: [phone] [place]

## 2019-02-15 ENCOUNTER — Other Ambulatory Visit: Payer: Self-pay

## 2019-02-15 ENCOUNTER — Telehealth (INDEPENDENT_AMBULATORY_CARE_PROVIDER_SITE_OTHER): Payer: Medicaid Other | Admitting: Neurology

## 2019-02-15 ENCOUNTER — Encounter: Payer: Self-pay | Admitting: Neurology

## 2019-02-15 VITALS — BP 118/65 | Ht 69.0 in | Wt 300.0 lb

## 2019-02-15 DIAGNOSIS — G43009 Migraine without aura, not intractable, without status migrainosus: Secondary | ICD-10-CM

## 2019-02-15 DIAGNOSIS — I671 Cerebral aneurysm, nonruptured: Secondary | ICD-10-CM

## 2019-02-15 NOTE — Addendum Note (Signed)
Addended by: Clois Comber on: 02/15/2019 09:01 AM   Modules accepted: Orders

## 2019-02-15 NOTE — Patient Instructions (Addendum)
1.  Topiramate 200mg  at bedtime 2.  Limit use of pain relievers to no more than 2 days out of week to prevent risk of rebound or medication-overuse headache. 3.  Follow up 6 months. Melinda [name]

## 2019-02-16 ENCOUNTER — Ambulatory Visit: Payer: Medicaid Other | Admitting: Neurology

## 2019-02-16 ENCOUNTER — Telehealth: Payer: Self-pay

## 2019-02-16 ENCOUNTER — Other Ambulatory Visit: Payer: Self-pay | Admitting: Internal Medicine

## 2019-02-16 DIAGNOSIS — Z1231 Encounter for screening mammogram for malignant neoplasm of breast: Secondary | ICD-10-CM

## 2019-02-16 MED FILL — TOPIRAMATE 200 MG TABLET: 200 | 30 days supply | Qty: 30 | Fill #2

## 2019-02-16 MED FILL — ELIQUIS 5 MG TABLET: 5 | 30 days supply | Qty: 60 | Fill #0

## 2019-02-16 MED FILL — HYDROCHLOROTHIAZIDE 12.5 MG: 12.5 | 30 days supply | Qty: 30 | Fill #1

## 2019-02-16 MED FILL — MONTELUKAST SOD 10 MG TAB: 10 | 30 days supply | Qty: 30 | Fill #2

## 2019-02-16 MED FILL — DOXEPIN HCL 25 MG CAPS: 25 | 30 days supply | Qty: 30 | Fill #1

## 2019-02-16 MED FILL — CITALOPRAM HBR 40 MG TABLET: 40 | 30 days supply | Qty: 30 | Fill #1

## 2019-02-16 MED FILL — PRAZOSIN HCL 1 MG CAPS: 1 | 30 days supply | Qty: 30 | Fill #2

## 2019-02-16 MED FILL — ATORVASTATIN 20 MG TABLET: 20 | 30 days supply | Qty: 30 | Fill #2

## 2019-02-16 MED FILL — buPROPion HCL ER (XL) 300 M: 300 | 30 days supply | Qty: 30 | Fill #1

## 2019-02-16 MED FILL — METOPROLOL TARTRATE 50 MG T: 50 | 30 days supply | Qty: 60 | Fill #2

## 2019-02-16 MED FILL — SYNTHROID 200 MCG TABLET: 200 | 30 days supply | Qty: 30 | Fill #5

## 2019-02-16 MED FILL — GABAPENTIN 100 MG CAPSULE: 100 | 30 days supply | Qty: 90 | Fill #2

## 2019-02-16 MED FILL — busPIRone HCL 5 MG TABS: 5 | 30 days supply | Qty: 90 | Fill #1

## 2019-02-16 NOTE — Telephone Encounter (Signed)
Contacted pt to go over sleep study results pt is aware and doesn't have any questions or concerns

## 2019-02-24 ENCOUNTER — Ambulatory Visit: Payer: Medicaid Other | Attending: Family Medicine | Admitting: Physician Assistant

## 2019-02-24 ENCOUNTER — Other Ambulatory Visit: Payer: Self-pay

## 2019-02-24 VITALS — BP 114/79 | HR 80 | Temp 98.4°F | Resp 18 | Ht 69.0 in | Wt 312.0 lb

## 2019-02-24 DIAGNOSIS — E118 Type 2 diabetes mellitus with unspecified complications: Secondary | ICD-10-CM

## 2019-02-24 DIAGNOSIS — G8929 Other chronic pain: Secondary | ICD-10-CM | POA: Diagnosis not present

## 2019-02-24 DIAGNOSIS — Z23 Encounter for immunization: Secondary | ICD-10-CM | POA: Diagnosis not present

## 2019-02-24 DIAGNOSIS — D1723 Benign lipomatous neoplasm of skin and subcutaneous tissue of right leg: Secondary | ICD-10-CM

## 2019-02-24 DIAGNOSIS — J301 Allergic rhinitis due to pollen: Secondary | ICD-10-CM

## 2019-02-24 DIAGNOSIS — M545 Low back pain: Secondary | ICD-10-CM

## 2019-02-24 LAB — GLUCOSE, POCT (MANUAL RESULT ENTRY): POC Glucose: 140 mg/dl — AB (ref 70–99)

## 2019-02-24 LAB — POCT GLYCOSYLATED HEMOGLOBIN (HGB A1C): Hemoglobin A1C: 6 % — AB (ref 4.0–5.6)

## 2019-02-24 MED ORDER — METHOCARBAMOL 500 MG PO TABS: 1000.0000 mg | ORAL_TABLET | Freq: Three times a day (TID) | ORAL | 0 refills | Status: DC | PRN

## 2019-02-24 MED ORDER — TRAMADOL HCL 50 MG PO TABS: ORAL_TABLET | ORAL | 1 refills | Status: DC

## 2019-02-24 MED FILL — METHOCARBAMOL 500 MG TABS: 500 | 15 days supply | Qty: 90 | Fill #0

## 2019-02-24 MED FILL — traMADol HCL 50 MG TABS: 50 | 30 days supply | Qty: 30 | Fill #0

## 2019-02-24 NOTE — Progress Notes (Signed)
Patient complains of binding over this morning and feeling a pull in the right rib cage area. Patient states lump on right hip is not painful.

## 2019-02-24 NOTE — Progress Notes (Signed)
Melinda [name], is a 53 y.o. female  [id number]  [id number]  DOB - [birth year]  Subjective:  Chief Complaint and HPI: Melinda [name] is a 53 y.o. female here today for a lump on her R lateral thigh about 2 months ago.  She noticed it when she started losing weight.  Not painful.  Not affecting ambulation.    Not checking blood sugars.   Needs RF on meds for pain due to chronic back and muscle pain.    ROS:   Constitutional:  No f/c, No night sweats, No unexplained weight loss. EENT:  No vision changes, No blurry vision, No hearing changes. No mouth, throat, or ear problems.  Respiratory: No cough, No SOB Cardiac: No CP, no palpitations GI:  No abd pain, No N/V/D. GU: No Urinary s/sx Musculoskeletal: No joint pain Neuro: No headache, no dizziness, no motor weakness.  Skin: No rash Endocrine:  No polydipsia. No polyuria.  Psych: Denies SI/HI  No problems updated.  ALLERGIES: Allergies  Allergen Reactions  . Ceftriaxone Anaphylaxis    ROCEPHIN  . Penicillins Shortness Of Breath    Has patient had a PCN reaction causing immediate rash, facial/tongue/throat swelling, SOB or lightheadedness with hypotension: Yes Has patient had a PCN reaction causing severe rash involving mucus membranes or skin necrosis: No Has patient had a PCN reaction that required hospitalization No Has patient had a PCN reaction occurring within the last 10 years: No If all of the above answers are "NO", then may proceed with Cephalosporin use.   . Shellfish Allergy Anaphylaxis  . Flonase [Fluticasone Propionate]     Makes migraines worse  . Gold-Containing Drug Products     HANDS ITCH  . Nickel     HANDS SWELL  . Citrus Rash    PAST MEDICAL HISTORY: Past Medical History:  Diagnosis Date  . ADHD (attention deficit hyperactivity disorder)   . Arthritis   . Asthma   . Cancer ([place])   . Chicken pox AGE 30  . Diabetes mellitus without complication ([place])   . GERD (gastroesophageal reflux  disease)   . Glaucoma    BOTH EYES, NO EYE DROPS  . Glaucoma   . History of blood transfusion MARCH 2016    2UNITS GIVEN AND IRON GIVEN  . Hyperglycemia 09/09/2014  . Hypertension   . 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 Iron deficiency anemia due to chronic blood loss 09/09/2014  . Low TSH level 09/08/2014  . Lumbar herniated disc   . Menorrhagia 09/08/2014  . Migraine    CLUSTER AND MIGRAINES  . Multiple thyroid nodules   . Ovarian mass, right 09/09/2014  . PE (pulmonary embolism) 2016  . Peripheral neuropathy    FINGER TIPS AND TOES NUMB SOME  . Preeclampsia  1983   WITH PREGNANCY  . PTSD (post-traumatic stress disorder)   . Scoliosis   . Seizures ([place]) AGE 30   NONE SINCE, HAD CHICKEN POX THEN    MEDICATIONS AT HOME: Prior to Admission medications   Medication Sig Start Date End Date Taking? Authorizing Provider  ACCU-CHEK AVIVA PLUS test strip USE TO TEST BLOOD GLUCOSE FOUR TIMES DAILY BEFORE MEALS AND AT BEDTIME 11/06/15  Yes Boykin Nearing, MD  ACCU-CHEK SOFTCLIX LANCETS lancets  10/20/17  Yes [provider]  albuterol (PROVENTIL) (2.5 MG/3ML) 0.083% nebulizer solution TAKE 3 MLS (2.5 MG TOTAL) BY NEBULIZATION EVERY 6 HOURS AS NEEDED  [place] BREATH Patient taking differently: Take 2.5 mg by nebulization every 6 (six) hours as needed for wheezing or shortness of breath.  05/13/17  Yes [name], MD  atorvastatin (LIPITOR) 20 MG tablet TAKE 1 TABLET BY MOUTH ONCE A DAY 12/01/18  Yes [name], MD  budesonide-formoterol [facility]) 160-4.5 MCG/ACT inhaler Inhale 2 puffs into the lungs 2 (two) times daily. 03/12/18  Yes Collene Gobble, MD  buPROPion (WELLBUTRIN XL) 300 MG 24 hr tablet Take 300 mg by mouth daily.  09/12/17  Yes [provider]  busPIRone (BUSPAR) 5 MG tablet Take 5 mg by mouth 3 (three) times daily.   Yes [provider]   citalopram (CELEXA) 40 MG tablet Take 40 mg by mouth daily.  12/01/17  Yes [provider]  doxepin (SINEQUAN) 10 MG capsule Take 10 mg by mouth.   Yes [provider]  ELIQUIS 5 MG TABS tablet TAKE 1 TABLET BY MOUTH 2 TIMES DAILY. 02/01/19  Yes [name], MD  fluticasone (CUTIVATE) 0.05 % cream Apply topically 2 (two) times daily. 05/13/16  Yes Funches, Josalyn, MD  gabapentin (NEURONTIN) 100 MG capsule TAKE 1 CAPSULE (100 MG TOTAL) BY MOUTH 3 (THREE) TIMES DAILY. 12/02/18  Yes [name], MD  hydrochlorothiazide (MICROZIDE) 12.5 MG capsule TAKE 1 CAPSULE BY MOUTH ONCE DAILY 12/31/18  Yes [name], MD  loratadine (CLARITIN) 10 MG tablet Take 1 tablet (10 mg total) by mouth daily. 10/31/17  Yes [name], MD  methocarbamol (ROBAXIN) 500 MG tablet Take 2 tablets (1,000 mg total) by mouth every 8 (eight) hours as needed for muscle spasms. 02/24/19  Yes Freeman Caldron M, PA-C  metoprolol tartrate (LOPRESSOR) 50 MG tablet TAKE 1 TABLET BY MOUTH TWO TIMES DAILY 12/01/18  Yes [name], MD  montelukast (SINGULAIR) 10 MG tablet TAKE 1 TABLET BY MOUTH EVERY EVENING AT BEDTIME 12/01/18  Yes [name], MD  PAZEO 0.7 % SOLN  08/25/18  Yes [provider]  prazosin (MINIPRESS) 1 MG capsule Take 1 mg by mouth at bedtime.   Yes [provider]  SYNTHROID 200 MCG tablet  10/29/18  Yes [provider]  topiramate (TOPAMAX) 200 MG tablet TAKE 1 TABLET BY MOUTH AT BEDTIME 11/30/18  Yes Jaffe, Adam R, DO  traMADol (ULTRAM) 50 MG tablet TAKE 1 TABLET BY MOUTH DAILY AS NEEDED FOR MODERATE OR SEVERE PAIN. 02/24/19  Yes Argentina Donovan, PA-C     Objective:  EXAM:   Vitals:   02/24/19 0902  BP: 114/79  Pulse: 80  Resp: 18  Temp: 98.4 F (36.9 C)  TempSrc: Oral  SpO2: 99%  Weight: (!) 312 lb (141.5 kg)  Height: 5\' 9"  (1.753 m)    General appearance : A&OX3. NAD. Non-toxic-appearing HEENT: Atraumatic and Normocephalic.   PERRLA. EOM intact.   Chest/Lungs:  Breathing-non-labored, Good air entry bilaterally, breath sounds normal without rales, rhonchi, or wheezing  CVS: S1 S2 regular, no murmurs, gallops, rubs  R lateral thigh about mid way down thigh 2cm freely mobile lesion; well-circumscribed and soft.   Extremities: Bilateral Lower Ext shows no edema, both legs are warm to touch with = pulse throughout Neurology:  CN II-XII grossly intact, Non focal.   Psych:  TP linear. J/I WNL. Normal speech. Appropriate eye contact and affect.  Skin:  No Rash  Data Review Lab Results  Component Value Date   HGBA1C 6.0 (A) 02/24/2019   HGBA1C 5.3  05/27/2018   HGBA1C 5.6 10/31/2017     Assessment & Plan   1. Controlled type 2 diabetes mellitus with complication, without long-term current use of insulin (HCC) Adequate control but canimprove with continued better diet and exercise - Glucose (CBG) - HgB A1c  2. Chronic bilateral low back pain without sciatica - methocarbamol (ROBAXIN) 500 MG tablet; Take 2 tablets (1,000 mg total) by mouth every 8 (eight) hours as needed for muscle spasms.  Dispense: 90 tablet; Refill: 0 - traMADol (ULTRAM) 50 MG tablet; TAKE 1 TABLET BY MOUTH DAILY AS NEEDED FOR MODERATE OR SEVERE PAIN.  Dispense: 30 tablet; Refill: 1  3. Lipoma of right lower extremity Elects to not see surgery unless gets larger.     Patient have been counseled extensively about nutrition and exercise  Return in about 3 months (around 05/27/2019) for Dr Johnson-chronic medical conditions.  The patient was given clear instructions to go to ER or return to medical center if symptoms don't improve, worsen or new problems develop. The patient verbalized understanding. The patient was told to call to get lab results if they haven't heard anything in the next week.     Freeman Caldron, PA-C [facility] and [place], [place]   02/24/2019, 9:14 AMPatient ID: [name], female   DOB: 15-Jan-1966, 53 y.o.   MRN: [id number]

## 2019-02-24 NOTE — Patient Instructions (Signed)
Continue to work on diet and eliminate sugars from your diet.  Drink 80-100 ounces water daily.     Lipoma  A lipoma is a noncancerous (benign) tumor that is made up of fat cells. This is a very common type of soft-tissue growth. Lipomas are usually found under the skin (subcutaneous). They may occur in any tissue of the body that contains fat. Common areas for lipomas to appear include the back, shoulders, buttocks, and thighs.  Lipomas grow slowly, and they are usually painless. Most lipomas do not cause problems and do not require treatment. What are the causes? The cause of this condition is not known. What increases the risk? You are more likely to develop this condition if:  You are 34-41 years old.  You have a family history of lipomas. What are the signs or symptoms? A lipoma usually appears as a small, round bump under the skin. In most cases, the lump will:  Feel soft or rubbery.  Not cause pain or other symptoms. However, if a lipoma is located in an area where it pushes on nerves, it can become painful or cause other symptoms. How is this diagnosed? A lipoma can usually be diagnosed with a physical exam. You may also have tests to confirm the diagnosis and to rule out other conditions. Tests may include:  Imaging tests, such as a CT scan or MRI.  Removal of a tissue sample to be looked at under a microscope (biopsy). How is this treated? Treatment for this condition depends on the size of the lipoma and whether it is causing any symptoms.  For small lipomas that are not causing problems, no treatment is needed.  If a lipoma is bigger or it causes problems, surgery may be done to remove the lipoma. Lipomas can also be removed to improve appearance. Most often, the procedure is done after applying a medicine that numbs the area (local anesthetic). Follow these instructions at home:  Watch your lipoma for any changes.  Keep all follow-up visits as told by your health  care provider. This is important. Contact a health care provider if:  Your lipoma becomes larger or hard.  Your lipoma becomes painful, red, or increasingly swollen. These could be signs of infection or a more serious condition. Get help right away if:  You develop tingling or numbness in an area near the lipoma. This could indicate that the lipoma is causing nerve damage. Summary  A lipoma is a noncancerous tumor that is made up of fat cells.  Most lipomas do not cause problems and do not require treatment.  If a lipoma is bigger or it causes problems, surgery may be done to remove the lipoma. This information is not intended to replace advice given to you by your health care provider. Make sure you discuss any questions you have with your health care provider. Document Released: 06/07/2002 Document Revised: 06/03/2017 Document Reviewed: 06/03/2017 Elsevier Patient Education  [place].

## 2019-02-25 ENCOUNTER — Ambulatory Visit
Admission: RE | Admit: 2019-02-25 | Discharge: 2019-02-25 | Disposition: A | Discharge status: Home / Self Care | Payer: Medicaid Other | Source: Ambulatory Visit | Attending: Neurology | Admitting: Neurology

## 2019-02-25 DIAGNOSIS — I671 Cerebral aneurysm, nonruptured: Secondary | ICD-10-CM | POA: Diagnosis not present

## 2019-02-25 MED ORDER — IOPAMIDOL (ISOVUE-370) INJECTION 76%
75.0000 mL | Freq: Once | INTRAVENOUS | Status: AC | PRN
  Administered 2019-02-25: 75 mL via INTRAVENOUS

## 2019-02-26 ENCOUNTER — Telehealth: Payer: Self-pay

## 2019-02-26 NOTE — Telephone Encounter (Signed)
-----   Message from [name], DO sent at 02/26/2019  7:12 AM EDT ----- The tiny aneurysm is stable.  We will continue to monitor.

## 2019-02-26 NOTE — Telephone Encounter (Signed)
Called patient she was made aware of results  

## 2019-03-02 DIAGNOSIS — E1165 Type 2 diabetes mellitus with hyperglycemia: Secondary | ICD-10-CM | POA: Diagnosis not present

## 2019-03-15 ENCOUNTER — Other Ambulatory Visit: Payer: Self-pay | Admitting: Internal Medicine

## 2019-03-15 MED FILL — CITALOPRAM HBR 40 MG TABLET: 40 | 30 days supply | Qty: 30 | Fill #2

## 2019-03-15 MED FILL — buPROPion HCL ER (XL) 300 M: 300 | 30 days supply | Qty: 30 | Fill #2

## 2019-03-15 MED FILL — SYNTHROID 200 MCG TABLET: 200 | 30 days supply | Qty: 30 | Fill #6

## 2019-03-15 MED FILL — GABAPENTIN 100 MG CAPSULE: 100 | 30 days supply | Qty: 90 | Fill #3

## 2019-03-15 MED FILL — TOPIRAMATE 200 MG TABLET: 200 | 30 days supply | Qty: 30 | Fill #3

## 2019-03-15 MED FILL — PRAZOSIN HCL 1 MG CAPS: 1 | 30 days supply | Qty: 30 | Fill #0

## 2019-03-15 MED FILL — HYDROCHLOROTHIAZIDE 12.5 MG: 12.5 | 30 days supply | Qty: 30 | Fill #2

## 2019-03-15 MED FILL — METOPROLOL TARTRATE 50 MG T: 50 | 30 days supply | Qty: 60 | Fill #0

## 2019-03-15 MED FILL — ELIQUIS 5 MG TABLET: 5 | 30 days supply | Qty: 60 | Fill #1

## 2019-03-15 MED FILL — DOXEPIN HCL 25 MG CAPS: 25 | 30 days supply | Qty: 30 | Fill #2

## 2019-03-16 DIAGNOSIS — F331 Major depressive disorder, recurrent, moderate: Secondary | ICD-10-CM | POA: Diagnosis not present

## 2019-03-16 DIAGNOSIS — F411 Generalized anxiety disorder: Secondary | ICD-10-CM | POA: Diagnosis not present

## 2019-03-16 DIAGNOSIS — F4310 Post-traumatic stress disorder, unspecified: Secondary | ICD-10-CM | POA: Diagnosis not present

## 2019-03-16 MED FILL — busPIRone HCL 5 MG TABS: 5 | 30 days supply | Qty: 90 | Fill #0

## 2019-03-19 DIAGNOSIS — E890 Postprocedural hypothyroidism: Secondary | ICD-10-CM | POA: Diagnosis not present

## 2019-03-19 DIAGNOSIS — E119 Type 2 diabetes mellitus without complications: Secondary | ICD-10-CM | POA: Diagnosis not present

## 2019-03-19 DIAGNOSIS — Z85850 Personal history of malignant neoplasm of thyroid: Secondary | ICD-10-CM | POA: Diagnosis not present

## 2019-03-22 ENCOUNTER — Ambulatory Visit: Payer: Medicaid Other | Admitting: Internal Medicine

## 2019-03-31 ENCOUNTER — Emergency Department (HOSPITAL_COMMUNITY)
Admission: EM | Admit: 2019-03-31 | Discharge: 2019-03-31 | Disposition: A | Discharge status: Home / Self Care | Payer: Medicaid Other | Attending: Emergency Medicine | Admitting: Emergency Medicine

## 2019-03-31 ENCOUNTER — Emergency Department (HOSPITAL_COMMUNITY): Payer: Medicaid Other

## 2019-03-31 ENCOUNTER — Encounter (HOSPITAL_COMMUNITY): Payer: Self-pay | Admitting: *Deleted

## 2019-03-31 ENCOUNTER — Other Ambulatory Visit: Payer: Self-pay

## 2019-03-31 ENCOUNTER — Ambulatory Visit
Admission: RE | Admit: 2019-03-31 | Discharge: 2019-03-31 | Disposition: A | Discharge status: Home / Self Care | Payer: Medicaid Other | Source: Ambulatory Visit | Attending: Internal Medicine | Admitting: Internal Medicine

## 2019-03-31 DIAGNOSIS — Z87891 Personal history of nicotine dependence: Secondary | ICD-10-CM | POA: Diagnosis not present

## 2019-03-31 DIAGNOSIS — M5441 Lumbago with sciatica, right side: Secondary | ICD-10-CM | POA: Insufficient documentation

## 2019-03-31 DIAGNOSIS — E119 Type 2 diabetes mellitus without complications: Secondary | ICD-10-CM | POA: Diagnosis not present

## 2019-03-31 DIAGNOSIS — M5442 Lumbago with sciatica, left side: Secondary | ICD-10-CM | POA: Diagnosis not present

## 2019-03-31 DIAGNOSIS — Z7901 Long term (current) use of anticoagulants: Secondary | ICD-10-CM | POA: Insufficient documentation

## 2019-03-31 DIAGNOSIS — M545 Low back pain: Secondary | ICD-10-CM | POA: Diagnosis not present

## 2019-03-31 DIAGNOSIS — I10 Essential (primary) hypertension: Secondary | ICD-10-CM | POA: Diagnosis not present

## 2019-03-31 DIAGNOSIS — Z1231 Encounter for screening mammogram for malignant neoplasm of breast: Secondary | ICD-10-CM | POA: Diagnosis not present

## 2019-03-31 DIAGNOSIS — Z85850 Personal history of malignant neoplasm of thyroid: Secondary | ICD-10-CM | POA: Diagnosis not present

## 2019-03-31 DIAGNOSIS — Z79899 Other long term (current) drug therapy: Secondary | ICD-10-CM | POA: Diagnosis not present

## 2019-03-31 MED ORDER — LIDOCAINE 5 % EX PTCH
1.0000 | MEDICATED_PATCH | CUTANEOUS | Status: DC
  Administered 2019-03-31: 1 via TRANSDERMAL
  Filled 2019-03-31: qty 1

## 2019-03-31 NOTE — ED Triage Notes (Signed)
The pt takes robaxin gagapentin tramadol and others

## 2019-03-31 NOTE — Discharge Instructions (Addendum)
Your x-ray does not show any injuries to your back. You have arthritis in your back. Recommend you continue with the medications prescribed to you by your doctor. Apply warm compresses to your back for 30 minutes at least three times daily. Follow up with your doctor, you may benefit from physical therapy.

## 2019-03-31 NOTE — ED Notes (Signed)
Patient Alert and oriented to baseline. Stable and ambulatory to baseline. Patient verbalized understanding of the discharge instructions.  Patient belongings were taken by the patient.   

## 2019-03-31 NOTE — ED Triage Notes (Signed)
The pt has chronic back pain  More pain in the past week  She has been likftinh objects

## 2019-03-31 NOTE — ED Provider Notes (Signed)
[facility] EMERGENCY DEPARTMENT Provider Note   CSN: [id number] Arrival date & time: 03/31/19  1621     History   Chief Complaint Chief Complaint  Patient presents with  . Back Pain    HPI Mccoy Chao is a 53 y.o. female.     53 year old female presents with complaint of low back pain for the past several days, progressively worsening.  Patient attributes her pain to cleaning house and carrying out the trash a few days ago.  Patient is taking her Robaxin, gabapentin, tramadol without improvement in her pain.  Denies falls or injuries, abdominal pain, loss of bowel or bladder control, groin numbness.  Pain radiates down both legs (diffuse).  Nothing makes pain better, pain is worse with any movement, sitting, standing.  No other complaints or concerns.     Past Medical History:  Diagnosis Date  . ADHD (attention deficit hyperactivity disorder)   . Arthritis   . Asthma   . Cancer ([place])   . Chicken pox AGE 37  . Diabetes mellitus without complication ([place])   . GERD (gastroesophageal reflux disease)   . Glaucoma    BOTH EYES, NO EYE DROPS  . Glaucoma   . History of blood transfusion MARCH 2016    2UNITS GIVEN AND IRON GIVEN  . Hyperglycemia 09/09/2014  . Hypertension   . 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 Iron deficiency anemia due to chronic blood loss 09/09/2014  . Low TSH level 09/08/2014  . Lumbar herniated disc   . Menorrhagia 09/08/2014  . Migraine    CLUSTER AND MIGRAINES  . Multiple thyroid nodules   . Ovarian mass, right 09/09/2014  . PE (pulmonary embolism) 2016  . Peripheral neuropathy    FINGER TIPS AND TOES NUMB SOME  . Preeclampsia  1983   WITH PREGNANCY  . PTSD (post-traumatic stress disorder)   . Scoliosis   . Seizures ([place]) AGE 89   NONE SINCE, HAD CHICKEN POX THEN    Patient Active Problem List   Diagnosis Date Noted  . Chronic cough 03/12/2018  .  Left shoulder pain 10/14/2016  . Hypertension 10/14/2016  . S/P thyroidectomy 06/27/2016  . Chronic bilateral low back pain without sciatica 04/22/2016  . Liver lesion 12/24/2015  . Papillary thyroid carcinoma ([place]) 12/21/2015  . Controlled type 2 diabetes mellitus with complication, with long-term current use of insulin ([place]) 10/10/2015  . Vision loss 08/25/2015  . Decreased vision in both eyes 08/25/2015  . Prolonged Q-T interval on ECG 08/19/2015  . History of pulmonary embolism 08/10/2015  . Mild persistent asthma 04/13/2015  . Fibroid, uterine   . Morbid obesity ([place]) 11/11/2014  . Sinus tachycardia 11/08/2014  . Migraine 10/14/2014  . Menorrhagia 09/08/2014    Past Surgical History:  Procedure Laterality Date  . ANGIOGRAM TO LEG  08-13-15   RIGHT  . CHOLECYSTECTOMY    . IR GENERIC HISTORICAL  04/04/2016   IR RADIOLOGIST EVAL & MGMT 04/04/2016 [name], MD [facility] INTERV RAD  . THYROIDECTOMY N/A 11/17/2015   Procedure: TOTAL THYROIDECTOMY;  Surgeon: Armandina Gemma, MD;  Location: [facility] ORS;  Service: General;  Laterality: N/A;  . UTERINE ARTERY EMBOLIZATION Bilateral 08/13/2015     OB History    Gravida  1   Para  1   Term      Preterm  1   AB      Living  1     SAB      TAB      Ectopic      Multiple      Live Births               Home Medications    Prior to Admission medications   Medication Sig Start Date End Date Taking? Authorizing Provider  ACCU-CHEK AVIVA PLUS test strip USE TO TEST BLOOD GLUCOSE FOUR TIMES DAILY BEFORE MEALS AND AT BEDTIME 11/06/15   Boykin Nearing, MD  ACCU-CHEK SOFTCLIX LANCETS lancets  10/20/17   [provider]  albuterol (PROVENTIL) (2.5 MG/3ML) 0.083% nebulizer solution TAKE 3 MLS (2.5 MG TOTAL) BY NEBULIZATION EVERY 6 HOURS AS NEEDED FORWHEEZING OR SHORTNESS OF BREATH Patient taking differently: Take 2.5 mg by nebulization every 6 (six) hours as needed for wheezing or shortness of breath.  05/13/17   [name], MD  atorvastatin (LIPITOR) 20 MG tablet TAKE 1 TABLET BY MOUTH ONCE A DAY 12/01/18   [name], MD  budesonide-formoterol [facility]) 160-4.5 MCG/ACT inhaler Inhale 2 puffs into the lungs 2 (two) times daily. 03/12/18   Collene Gobble, MD  buPROPion (WELLBUTRIN XL) 300 MG 24 hr tablet Take 300 mg by mouth daily.  09/12/17   [provider]  busPIRone (BUSPAR) 5 MG tablet Take 5 mg by mouth 3 (three) times daily.    [provider]  citalopram (CELEXA) 40 MG tablet Take 40 mg by mouth daily.  12/01/17   [provider]  doxepin (SINEQUAN) 10 MG capsule Take 10 mg by mouth.    [provider]  ELIQUIS 5 MG TABS tablet TAKE 1 TABLET BY MOUTH 2 TIMES DAILY. 02/01/19   [name], MD  fluticasone (CUTIVATE) 0.05 % cream Apply topically 2 (two) times daily. 05/13/16   Funches, Adriana Mccallum, MD  gabapentin (NEURONTIN) 100 MG capsule TAKE 1 CAPSULE (100 MG TOTAL) BY MOUTH 3 (THREE) TIMES DAILY. 12/02/18   [name], MD  hydrochlorothiazide (MICROZIDE) 12.5 MG capsule TAKE 1 CAPSULE BY MOUTH ONCE DAILY 12/31/18   [name], MD  loratadine (CLARITIN) 10 MG tablet Take 1 tablet (10 mg total) by mouth daily. 10/31/17   [name], MD  methocarbamol (ROBAXIN) 500 MG tablet Take 2 tablets (1,000 mg total) by mouth every 8 (eight) hours as needed for muscle spasms. 02/24/19   Argentina Donovan, PA-C  metoprolol tartrate (LOPRESSOR) 50 MG tablet TAKE 1 TABLET BY MOUTH TWO TIMES DAILY 03/15/19   [name], MD  montelukast (SINGULAIR) 10 MG tablet TAKE 1 TABLET BY MOUTH EVERY EVENING AT BEDTIME 12/01/18   [name], MD  PAZEO 0.7 % SOLN  08/25/18   [provider]  prazosin (MINIPRESS) 1 MG capsule Take 1 mg by mouth at bedtime.    [provider]  SYNTHROID 200 MCG tablet  10/29/18   [provider]  topiramate (TOPAMAX) 200 MG tablet TAKE 1 TABLET BY MOUTH AT BEDTIME 11/30/18   Jaffe, Adam R, DO  traMADol  (ULTRAM) 50 MG tablet TAKE 1 TABLET BY MOUTH DAILY AS NEEDED FOR MODERATE OR SEVERE PAIN. 02/24/19   Argentina Donovan, PA-C    Family History Family History  Problem Relation Age of Onset  . Diabetes Mother   . Breast cancer Mother   . CAD Mother   . Hypertension Mother   . Alcohol abuse Father   . Hypertension Father   . Breast cancer Maternal  Aunt   . Breast cancer Maternal Aunt     Social History Social History   Tobacco Use  . Smoking status: Former Smoker    Packs/day: 0.50    Years: 40.00    Pack years: 20.00    Types: Cigarettes    Quit date: 11/03/2014    Years since quitting: 4.4  . Smokeless tobacco: Never Used  Substance Use Topics  . Alcohol use: No    Alcohol/week: 0.0 standard drinks  . Drug use: No     Allergies   Ceftriaxone, Penicillins, Shellfish allergy, Flonase [fluticasone propionate], Gold-containing drug products, Nickel, and Citrus   Review of Systems Review of Systems  Constitutional: Negative for fever.  Gastrointestinal: Negative for abdominal pain, constipation, diarrhea, nausea and vomiting.  Genitourinary: Negative for decreased urine volume and difficulty urinating.  Musculoskeletal: Positive for back pain.  Skin: Negative for rash and wound.  Allergic/Immunologic: Positive for immunocompromised state.  Neurological: Negative for weakness and numbness.  All other systems reviewed and are negative.    Physical Exam Updated Vital Signs BP 123/72 (BP Location: Left Arm)   Pulse 75   Temp 98.4 F (36.9 C) (Oral)   Resp 16   Ht 5\' 9"  (1.753 m)   Wt (!) 141.5 kg   LMP 03/16/2019   SpO2 96%   BMI 46.07 kg/m   Physical Exam Vitals signs and nursing note reviewed.  Constitutional:      General: She is not in acute distress.    Appearance: She is well-developed. She is not diaphoretic.  HENT:     Head: Normocephalic and atraumatic.  Cardiovascular:     Rate and Rhythm: Normal rate and regular rhythm.     Pulses: Normal  pulses.     Heart sounds: Normal heart sounds.  Pulmonary:     Effort: Pulmonary effort is normal.     Breath sounds: Normal breath sounds.  Abdominal:     Palpations: Abdomen is soft.     Tenderness: There is no abdominal tenderness.  Musculoskeletal:        General: Tenderness present. No signs of injury.     Lumbar back: She exhibits tenderness. She exhibits no bony tenderness.       Back:     Right lower leg: No edema.     Left lower leg: No edema.     Comments: Tenderness palpation left and right lower back, no midline or bony tenderness. Also pain with palpation of her left and right SI joint space.  Skin:    General: Skin is warm and dry.     Findings: No erythema or rash.  Neurological:     Mental Status: She is alert and oriented to person, place, and time.     Sensory: No sensory deficit.     Motor: No weakness.     Deep Tendon Reflexes: Reflexes normal.  Psychiatric:        Behavior: Behavior normal.      ED Treatments / Results  Labs (all labs ordered are listed, but only abnormal results are displayed) Labs Reviewed - No data to display  EKG None  Radiology Dg Lumbar Spine Complete  Result Date: 03/31/2019 CLINICAL DATA:  53 year old female with chronic low back pain. EXAM: LUMBAR SPINE - COMPLETE 4+ VIEW COMPARISON:  Lumbar spine radiograph dated 07/08/2018 FINDINGS: There is no acute fracture or subluxation of the lumbar spine. There is straightening of normal lumbar lordosis which may be positional or due to muscle spasm. Degenerative  changes primarily at L5-S1 with endplate irregularity and disc space narrowing and associated narrowing the neural foramina at this level. The soft tissues are grossly unremarkable. Right upper quadrant cholecystectomy clips noted. Large amount of stool noted throughout the colon. IMPRESSION: 1. No acute/traumatic lumbar spine pathology. 2. Degenerative changes primarily at L5-S1. 3. Constipation. Electronically Signed   By:  Anner Crete M.D.   On: 03/31/2019 20:58   Mm 3d Screen Breast Bilateral  Result Date: 03/31/2019 CLINICAL DATA:  Screening. EXAM: DIGITAL SCREENING BILATERAL MAMMOGRAM WITH TOMO AND CAD COMPARISON:  Previous exam(s). ACR Breast Density Category a: The breast tissue is almost entirely fatty. FINDINGS: There are no findings suspicious for malignancy. Images were processed with CAD. IMPRESSION: No mammographic evidence of malignancy. A result letter of this screening mammogram will be mailed directly to the patient. RECOMMENDATION: Screening mammogram in one year. (Code:SM-B-01Y) BI-RADS CATEGORY  1: Negative. Electronically Signed   By: Ammie Ferrier M.D.   On: 03/31/2019 16:05    Procedures Procedures (including critical care time)  Medications Ordered in ED Medications  lidocaine (LIDODERM) 5 % 1 patch (1 patch Transdermal Patch Applied 03/31/19 2058)     Initial Impression / Assessment and Plan / ED Course  I have reviewed the triage vital signs and the nursing notes.  Pertinent labs & imaging results that were available during my care of the patient were reviewed by me and considered in my medical decision making (see chart for details).  Clinical Course as of Mar 30 2120  Wed Mar 30, 4884  3433 53 year old female with complaint of low back pain after taking out the trash.  On exam patient is tenderness right and left and right lower back into the left SI space.  Leg strength is symmetric, no saddle anesthesia, no abdominal pain, no loss of bowel or bladder control.  X-ray shows degenerative changes primarily at L5-S1.  Patient was given a Lidoderm patch in the ER, she is prescribed Robaxin, Ultram, Neurontin for her pain, advised to continue with these medications and call her PCP for follow-up.  Patient also apply warm compresses.  Patient may benefit from physical therapy.   [LM]    Clinical Course User Index [LM] Tacy Learn, PA-C      Final Clinical Impressions(s) /  ED Diagnoses   Final diagnoses:  Acute bilateral low back pain with bilateral sciatica    ED Discharge Orders    None       Tacy Learn, PA-C 03/31/19 2121    Gareth Morgan, MD 04/01/19 1231

## 2019-04-01 DIAGNOSIS — E1165 Type 2 diabetes mellitus with hyperglycemia: Secondary | ICD-10-CM | POA: Diagnosis not present

## 2019-04-13 ENCOUNTER — Other Ambulatory Visit: Payer: Self-pay | Admitting: Internal Medicine

## 2019-04-13 ENCOUNTER — Other Ambulatory Visit: Payer: Self-pay | Admitting: Neurology

## 2019-04-13 ENCOUNTER — Other Ambulatory Visit: Payer: Self-pay | Admitting: Physician Assistant

## 2019-04-13 DIAGNOSIS — E118 Type 2 diabetes mellitus with unspecified complications: Secondary | ICD-10-CM

## 2019-04-13 DIAGNOSIS — I10 Essential (primary) hypertension: Secondary | ICD-10-CM

## 2019-04-13 DIAGNOSIS — G8929 Other chronic pain: Secondary | ICD-10-CM

## 2019-04-13 MED FILL — TOPIRAMATE 200 MG TABLET: 200 | 30 days supply | Qty: 30 | Fill #0

## 2019-04-13 MED FILL — METOPROLOL TARTRATE 50 MG T: 50 | 30 days supply | Qty: 60 | Fill #1

## 2019-04-13 MED FILL — ATORVASTATIN 20 MG TABLET: 20 | 30 days supply | Qty: 30 | Fill #0

## 2019-04-13 MED FILL — METHOCARBAMOL 500 MG TABS: 500 | 15 days supply | Qty: 90 | Fill #0

## 2019-04-13 MED FILL — SYNTHROID 200 MCG TABLET: 200 | 30 days supply | Qty: 30 | Fill #0

## 2019-04-13 MED FILL — DOXEPIN HCL 25 MG CAPS: 25 | 30 days supply | Qty: 30 | Fill #0

## 2019-04-13 MED FILL — traMADol HCL 50 MG TABS: 50 | 30 days supply | Qty: 30 | Fill #0

## 2019-04-13 MED FILL — ELIQUIS 5 MG TABLET: 5 | 30 days supply | Qty: 60 | Fill #2

## 2019-04-13 MED FILL — PRAZOSIN HCL 1 MG CAPS: 1 | 30 days supply | Qty: 30 | Fill #1

## 2019-04-13 MED FILL — GABAPENTIN 100 MG CAPSULE: 100 | 30 days supply | Qty: 90 | Fill #4

## 2019-04-13 MED FILL — HYDROCHLOROTHIAZIDE 12.5 MG: 12.5 | 30 days supply | Qty: 30 | Fill #0

## 2019-04-13 NOTE — Telephone Encounter (Signed)
Requested Prescriptions   Pending Prescriptions Disp Refills  . topiramate (TOPAMAX) 200 MG tablet [Pharmacy Med Name: TOPIRAMATE 200 MG TABLET 200 Tablet] 30 tablet 3    Sig: TAKE 1 TABLET BY MOUTH AT BEDTIME   Rx last filled:11/30/18 #30 3 refills  Pt last seen: 02/15/19  Follow up appt scheduled:08/18/2019

## 2019-04-15 ENCOUNTER — Ambulatory Visit: Payer: Medicaid Other | Admitting: Family Medicine

## 2019-04-29 ENCOUNTER — Encounter: Payer: Self-pay | Admitting: Internal Medicine

## 2019-04-29 ENCOUNTER — Ambulatory Visit: Payer: Medicaid Other | Attending: Family Medicine | Admitting: Internal Medicine

## 2019-04-29 ENCOUNTER — Other Ambulatory Visit: Payer: Self-pay

## 2019-04-29 VITALS — BP 110/77 | HR 85 | Temp 98.0°F | Resp 16 | Wt 317.2 lb

## 2019-04-29 DIAGNOSIS — Z794 Long term (current) use of insulin: Secondary | ICD-10-CM | POA: Insufficient documentation

## 2019-04-29 DIAGNOSIS — Z86711 Personal history of pulmonary embolism: Secondary | ICD-10-CM | POA: Insufficient documentation

## 2019-04-29 DIAGNOSIS — M545 Low back pain, unspecified: Secondary | ICD-10-CM

## 2019-04-29 DIAGNOSIS — E118 Type 2 diabetes mellitus with unspecified complications: Secondary | ICD-10-CM | POA: Diagnosis not present

## 2019-04-29 DIAGNOSIS — J4530 Mild persistent asthma, uncomplicated: Secondary | ICD-10-CM | POA: Diagnosis not present

## 2019-04-29 DIAGNOSIS — H538 Other visual disturbances: Secondary | ICD-10-CM | POA: Insufficient documentation

## 2019-04-29 DIAGNOSIS — Z79899 Other long term (current) drug therapy: Secondary | ICD-10-CM | POA: Insufficient documentation

## 2019-04-29 DIAGNOSIS — R42 Dizziness and giddiness: Secondary | ICD-10-CM | POA: Diagnosis not present

## 2019-04-29 DIAGNOSIS — I10 Essential (primary) hypertension: Secondary | ICD-10-CM | POA: Diagnosis not present

## 2019-04-29 DIAGNOSIS — Z6841 Body Mass Index (BMI) 40.0 and over, adult: Secondary | ICD-10-CM | POA: Insufficient documentation

## 2019-04-29 DIAGNOSIS — Z7901 Long term (current) use of anticoagulants: Secondary | ICD-10-CM | POA: Insufficient documentation

## 2019-04-29 DIAGNOSIS — F419 Anxiety disorder, unspecified: Secondary | ICD-10-CM | POA: Diagnosis not present

## 2019-04-29 DIAGNOSIS — M25511 Pain in right shoulder: Secondary | ICD-10-CM | POA: Insufficient documentation

## 2019-04-29 DIAGNOSIS — M25512 Pain in left shoulder: Secondary | ICD-10-CM | POA: Diagnosis not present

## 2019-04-29 DIAGNOSIS — R269 Unspecified abnormalities of gait and mobility: Secondary | ICD-10-CM | POA: Diagnosis not present

## 2019-04-29 DIAGNOSIS — N920 Excessive and frequent menstruation with regular cycle: Secondary | ICD-10-CM | POA: Diagnosis not present

## 2019-04-29 DIAGNOSIS — R05 Cough: Secondary | ICD-10-CM | POA: Diagnosis not present

## 2019-04-29 DIAGNOSIS — G8929 Other chronic pain: Secondary | ICD-10-CM | POA: Insufficient documentation

## 2019-04-29 DIAGNOSIS — Z86718 Personal history of other venous thrombosis and embolism: Secondary | ICD-10-CM | POA: Insufficient documentation

## 2019-04-29 DIAGNOSIS — Z8249 Family history of ischemic heart disease and other diseases of the circulatory system: Secondary | ICD-10-CM | POA: Insufficient documentation

## 2019-04-29 DIAGNOSIS — E278 Other specified disorders of adrenal gland: Secondary | ICD-10-CM | POA: Insufficient documentation

## 2019-04-29 LAB — GLUCOSE, POCT (MANUAL RESULT ENTRY): POC Glucose: 170 mg/dl — AB (ref 70–99)

## 2019-04-29 NOTE — Patient Instructions (Signed)
Please go slow when you change positions like going from sitting to standing.  I have referred you to physical therapy for gait training and therapy on your lower back.  I have referred you to sports medicine for your right shoulder.  Please go to [facility] radiology department to get the x-ray done on the right shoulder.

## 2019-04-29 NOTE — Progress Notes (Signed)
Patient ID: Joannamarie Jeanjacques, female    DOB: 1965/08/13  MRN: [id number]  CC: Hospitalization Follow-up (ED)   Subjective: Ceniyah Debona is a 53 y.o. female who presents for chronic ds management and ER f/u Her concerns today include:  Pt with hxofDM(followed by Dr. Jana Hakim, moderate persistent asthma,migraines followed by neurology, uterine fibroids followed by GYN, papillary thyroid CA s/pthyroidectomy, history of DVT/PE onEliquislifetime,obesity, PTSD/anxiety.   Since last visit, patient seen in the emergency room 03/31/2019 with increased lower back pain times several days that was associated with increased physical activity (cleaning house and carrying out the trash) for a few days.  She is on Robaxin, gabapentin and tramadol but was not getting relief.  In the emergency room x-ray of the lumbar spine revealed degenerative changes primarily at L5-S1.  She was given a Lidoderm patch and told to follow-up with PCP.    -since then she has been using Lidoderm spray from OTC which helps a little.  Also has TENS unit which she uses.  "Part of the back pain is premenstrual."  Menses come Q 30 days and las 6 days.  -no falls but feels off balance.  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 -also c/o some pain in RT shoulder.  Started after fall in Dec of last yr or Jan of this yr..  Hurts to lay on RT side  HTN:  Compliant with medications including metoprolol and hydrochlorothiazide.  She limits salt in the foods.  Denies any chest pains or shortness of breath.  No lower extremity swelling.  DM: followed by [facility] endocrinology.  She is diet controlled. Eating habits -1/2 mth she is vegan then other 1/2 mth she eats meat because she feels sluggish if she does not.  Hx of anemia in the past and does not wish to become anemic again.  -activity level:  taking it easy and slow because balanace feels  off.  Little light head when she stands Patient Active Problem List   Diagnosis Date Noted  . Adrenal mass, right ([place]) 04/29/2019  . Chronic cough 03/12/2018  . Left shoulder pain 10/14/2016  . Hypertension 10/14/2016  . S/P thyroidectomy 06/27/2016  . Chronic bilateral low back pain without sciatica 04/22/2016  . Liver lesion 12/24/2015  . Papillary thyroid carcinoma ([place]) 12/21/2015  . Controlled type 2 diabetes mellitus with complication, with long-term current use of insulin ([place]) 10/10/2015  . Vision loss 08/25/2015  . Decreased vision in both eyes 08/25/2015  . Prolonged Q-T interval on ECG 08/19/2015  . History of pulmonary embolism 08/10/2015  . Mild persistent asthma 04/13/2015  . Fibroid, uterine   . Morbid obesity ([place]) 11/11/2014  . Sinus tachycardia 11/08/2014  . Migraine 10/14/2014  . Menorrhagia 09/08/2014     Current Outpatient Medications on File Prior to Visit  Medication Sig Dispense Refill  . ACCU-CHEK AVIVA PLUS test strip USE TO TEST BLOOD GLUCOSE FOUR TIMES DAILY BEFORE MEALS AND AT BEDTIME 100 each 12  . ACCU-CHEK SOFTCLIX LANCETS lancets   3  . albuterol (PROVENTIL) (2.5 MG/3ML) 0.083% nebulizer solution TAKE 3 MLS (2.5 MG TOTAL) BY NEBULIZATION EVERY 6 HOURS AS NEEDED FORWHEEZING OR SHORTNESS OF BREATH (Patient taking differently: Take 2.5 mg by nebulization every 6 (six) hours as needed for wheezing or shortness of breath. ) 150 mL 1  . atorvastatin (LIPITOR) 20 MG tablet TAKE 1 TABLET BY MOUTH ONCE  A DAY 30 tablet 2  . budesonide-formoterol (SYMBICORT) 160-4.5 MCG/ACT inhaler Inhale 2 puffs into the lungs 2 (two) times daily. 2 Inhaler 0  . buPROPion (WELLBUTRIN XL) 300 MG 24 hr tablet Take 300 mg by mouth daily.   2  . busPIRone (BUSPAR) 5 MG tablet Take 5 mg by mouth 3 (three) times daily.    . citalopram (CELEXA) 40 MG tablet Take 40 mg by mouth daily.   2  . doxepin (SINEQUAN) 10 MG capsule Take 10 mg by mouth.    Arne Cleveland 5 MG TABS tablet TAKE 1  TABLET BY MOUTH 2 TIMES DAILY. 60 tablet 2  . fluticasone (CUTIVATE) 0.05 % cream Apply topically 2 (two) times daily. 30 g 2  . gabapentin (NEURONTIN) 100 MG capsule TAKE 1 CAPSULE (100 MG TOTAL) BY MOUTH 3 (THREE) TIMES DAILY. 90 capsule 5  . hydrochlorothiazide (MICROZIDE) 12.5 MG capsule TAKE 1 CAPSULE BY MOUTH ONCE DAILY 30 capsule 2  . loratadine (CLARITIN) 10 MG tablet Take 1 tablet (10 mg total) by mouth daily. 30 tablet 11  . methocarbamol (ROBAXIN) 500 MG tablet TAKE 2 TABLETS (1,000 MG TOTAL) BY MOUTH EVERY 8 (EIGHT) HOURS AS NEEDED FOR MUSCLE SPASMS. 90 tablet 0  . metoprolol tartrate (LOPRESSOR) 50 MG tablet TAKE 1 TABLET BY MOUTH TWO TIMES DAILY 60 tablet 2  . montelukast (SINGULAIR) 10 MG tablet TAKE 1 TABLET BY MOUTH EVERY EVENING AT BEDTIME 30 tablet 2  . PAZEO 0.7 % SOLN     . prazosin (MINIPRESS) 1 MG capsule Take 1 mg by mouth at bedtime.    Marland Kitchen SYNTHROID 200 MCG tablet     . topiramate (TOPAMAX) 200 MG tablet TAKE 1 TABLET BY MOUTH AT BEDTIME 30 tablet 3  . traMADol (ULTRAM) 50 MG tablet TAKE 1 TABLET BY MOUTH DAILY AS NEEDED FOR MODERATE OR SEVERE PAIN. 30 tablet 1   No current facility-administered medications on file prior to visit.     Allergies  Allergen Reactions  . Ceftriaxone Anaphylaxis    ROCEPHIN  . Penicillins Shortness Of Breath    Has patient had a PCN reaction causing immediate rash, facial/tongue/throat swelling, SOB or lightheadedness with hypotension: Yes Has patient had a PCN reaction causing severe rash involving mucus membranes or skin necrosis: No Has patient had a PCN reaction that required hospitalization No Has patient had a PCN reaction occurring within the last 10 years: No If all of the above answers are "NO", then may proceed with Cephalosporin use.   . Shellfish Allergy Anaphylaxis  . Flonase [Fluticasone Propionate]     Makes migraines worse  . Gold-Containing Drug Products     HANDS ITCH  . Nickel     HANDS SWELL  . Citrus Rash     Social History   Socioeconomic History  . Marital status: Single    Spouse name: Not on file  . Number of children: 1  . Years of education: 49  . Highest education level: Some college, no degree  Occupational History  . Occupation: disabled  Social Needs  . Financial resource strain: Not on file  . Food insecurity    Worry: Not on file    Inability: Not on file  . Transportation needs    Medical: Not on file    Non-medical: Not on file  Tobacco Use  . Smoking status: Former Smoker    Packs/day: 0.50    Years: 40.00    Pack years: 20.00    Types: Cigarettes  Quit date: 11/03/2014    Years since quitting: 4.4  . Smokeless tobacco: Never Used  Substance and Sexual Activity  . Alcohol use: No    Alcohol/week: 0.0 standard drinks  . Drug use: No  . Sexual activity: Never    Birth control/protection: None  Lifestyle  . Physical activity    Days per week: Not on file    Minutes per session: Not on file  . Stress: Not on file  Relationships  . Social Herbalist on phone: Not on file    Gets together: Not on file    Attends religious service: Not on file    Active member of club or organization: Not on file    Attends meetings of clubs or organizations: Not on file    Relationship status: Not on file  . Intimate partner violence    Fear of current or ex partner: Not on file    Emotionally abused: Not on file    Physically abused: Not on file    Forced sexual activity: Not on file  Other Topics Concern  . Not on file  Social History Narrative   Patient is right-handed. She lives in a one level handicap accessible apartment. She avoids caffeine. She uses stationary stationary pedals to use for exercise.    Family History  Problem Relation Age of Onset  . Diabetes Mother   . Breast cancer Mother   . CAD Mother   . Hypertension Mother   . Alcohol abuse Father   . Hypertension Father   . Breast cancer Maternal Aunt   . Breast cancer Maternal Aunt      Past Surgical History:  Procedure Laterality Date  . ANGIOGRAM TO LEG  08-13-15   RIGHT  . CHOLECYSTECTOMY    . IR GENERIC HISTORICAL  04/04/2016   IR RADIOLOGIST EVAL & MGMT 04/04/2016 [name], MD [facility] INTERV RAD  . THYROIDECTOMY N/A 11/17/2015   Procedure: TOTAL THYROIDECTOMY;  Surgeon: Armandina Gemma, MD;  Location: [facility];  Service: General;  Laterality: N/A;  . UTERINE ARTERY EMBOLIZATION Bilateral 08/13/2015    ROS: Review of Systems Negative except as stated above  PHYSICAL EXAM: BP 110/77   Pulse 85   Temp 98 F (36.7 C) (Oral)   Resp 16   Wt (!) 317 lb 3.2 oz (143.9 kg)   SpO2 97%   BMI 46.84 kg/m   Wt Readings from Last 3 Encounters:  04/29/19 (!) 317 lb 3.2 oz (143.9 kg)  03/31/19 (!) 312 lb (141.5 kg)  02/24/19 (!) 312 lb (141.5 kg)  BP sitting 117/78 pulse 83; BP standing 116/80 pulse 93  Physical Exam  General appearance - alert, well appearing, and in no distress Mental status - normal mood, behavior, speech, dress, motor activity, and thought processes Mouth - mucous membranes moist, pharynx normal without lesions Neck - supple, no significant adenopathy Chest - clear to auscultation, no wheezes, rales or rhonchi, symmetric air entry Heart - normal rate, regular rhythm, normal S1, S2, no murmurs, rubs, clicks or gallops Musculoskeletal -right shoulder: Mild to moderate tenderness on palpation of the anterior joint line.  She has pretty good range of motion in all direction.  Drop arm test slightly positive.  She ambulates with a cane Extremities -no lower extremity edema   CMP Latest Ref Rng & Units 08/28/2018 02/18/2018 09/19/2017  Glucose 65 - 99 mg/dL 112(H) 91 142(H)  BUN 6 - 24 mg/dL 11 12 13   Creatinine 0.57 - 1.00  mg/dL 0.78 0.93 1.01(H)  Sodium 134 - 144 mmol/L 140 140 143  Potassium 3.5 - 5.2 mmol/L 4.2 3.4(L) 3.6  Chloride 96 - 106 mmol/L 104 106 109  CO2 20 - 29 mmol/L 19(L) 24 25  Calcium 8.7 - 10.2 mg/dL 9.0 9.0 8.5(L)  Total Protein 6.0  - 8.5 g/dL 6.4 - 7.1  Total Bilirubin 0.0 - 1.2 mg/dL 0.4 - 0.5  Alkaline Phos 39 - 117 IU/L 100 - 92  AST 0 - 40 IU/L 22 - 61(H)  ALT 0 - 32 IU/L 25 - 64(H)   Lipid Panel     Component Value Date/Time   CHOL 159 09/08/2014 0147   TRIG 89 09/08/2014 0147   HDL 58 09/08/2014 0147   CHOLHDL 2.7 09/08/2014 0147   VLDL 18 09/08/2014 0147   LDLCALC 83 09/08/2014 0147    CBC    Component Value Date/Time   WBC 10.0 08/28/2018 1020   WBC 6.9 02/18/2018 2337   RBC 5.14 08/28/2018 1020   RBC 5.85 (H) 02/18/2018 2337   HGB 15.2 08/28/2018 1020   HCT 44.9 08/28/2018 1020   PLT 315 08/28/2018 1020   MCV 87 08/28/2018 1020   MCH 29.6 08/28/2018 1020   MCH 29.2 02/18/2018 2337   MCHC 33.9 08/28/2018 1020   MCHC 32.8 02/18/2018 2337   RDW 13.4 08/28/2018 1020   LYMPHSABS 1.9 02/18/2018 2337   MONOABS 0.6 02/18/2018 2337   EOSABS 0.1 02/18/2018 2337   BASOSABS 0.0 02/18/2018 2337    ASSESSMENT AND PLAN: 1. Chronic bilateral low back pain without sciatica Patient to continue tramadol and Robaxin as needed.  She can also continue to use the TENS unit.  Will refer her for some physical therapy - Ambulatory referral to Physical Therapy  2. Chronic right shoulder pain Continue tramadol.  Refer to sports medicine to see if she would benefit from an injection - Ambulatory referral to Sports Medicine - DG Shoulder Right; Future  3. Controlled type 2 diabetes mellitus with complication, without long-term current use of insulin (HCC) Diet control.  Encourage her to continue healthy eating habits and try to move as much as her back will allow. - POCT glucose (manual entry) - Microalbumin / creatinine urine ratio - CBC - Comprehensive metabolic panel - Lipid panel  4. Essential hypertension At goal.  Continue current medications  5. Class 3 severe obesity due to excess calories with serious comorbidity and body mass index (BMI) of 45.0 to 49.9 in adult [facility]) See #3 above  6. Gait  disturbance Advised to go slow with position changes. Check CBC to rule out anemia - Ambulatory referral to Physical Therapy     Patient was given the opportunity to ask questions.  Patient verbalized understanding of the plan and was able to repeat key elements of the plan.   Orders Placed This Encounter  Procedures  . Microalbumin / creatinine urine ratio  . POCT glucose (manual entry)     Requested Prescriptions    No prescriptions requested or ordered in this encounter    No follow-ups on file.  [name], MD, FACP

## 2019-04-30 LAB — COMPREHENSIVE METABOLIC PANEL
ALT: 12 IU/L (ref 0–32)
AST: 11 IU/L (ref 0–40)
Albumin/Globulin Ratio: 1.6 (ref 1.2–2.2)
Albumin: 4.4 g/dL (ref 3.8–4.8)
Alkaline Phosphatase: 135 IU/L — ABNORMAL HIGH (ref 39–117)
BUN/Creatinine Ratio: 14 (ref 9–23)
BUN: 15 mg/dL (ref 6–24)
Bilirubin Total: 0.3 mg/dL (ref 0.0–1.2)
CO2: 22 mmol/L (ref 20–29)
Calcium: 9.1 mg/dL (ref 8.7–10.2)
Chloride: 107 mmol/L — ABNORMAL HIGH (ref 96–106)
Creatinine, Ser: 1.07 mg/dL — ABNORMAL HIGH (ref 0.57–1.00)
GFR calc Af Amer: 70 mL/min/{1.73_m2} (ref >59)
GFR calc non Af Amer: 61 mL/min/{1.73_m2} (ref >59)
Globulin, Total: 2.7 g/dL (ref 1.5–4.5)
Glucose: 113 mg/dL — ABNORMAL HIGH (ref 65–99)
Potassium: 3.7 mmol/L (ref 3.5–5.2)
Sodium: 142 mmol/L (ref 134–144)
Total Protein: 7.1 g/dL (ref 6.0–8.5)

## 2019-04-30 LAB — CBC
Hematocrit: 45.4 % (ref 34.0–46.6)
Hemoglobin: 14.7 g/dL (ref 11.1–15.9)
MCH: 29.1 pg (ref 26.6–33.0)
MCHC: 32.4 g/dL (ref 31.5–35.7)
MCV: 90 fL (ref 79–97)
Platelets: 289 10*3/uL (ref 150–450)
RBC: 5.05 x10E6/uL (ref 3.77–5.28)
RDW: 14 % (ref 11.7–15.4)
WBC: 10.9 10*3/uL — ABNORMAL HIGH (ref 3.4–10.8)

## 2019-04-30 LAB — LIPID PANEL
Chol/HDL Ratio: 3 ratio (ref 0.0–4.4)
Cholesterol, Total: 156 mg/dL (ref 100–199)
HDL: 52 mg/dL (ref >39)
LDL Chol Calc (NIH): 82 mg/dL (ref 0–99)
Triglycerides: 125 mg/dL (ref 0–149)
VLDL Cholesterol Cal: 22 mg/dL (ref 5–40)

## 2019-04-30 LAB — MICROALBUMIN / CREATININE URINE RATIO
Creatinine, Urine: 540.7 mg/dL
Microalb/Creat Ratio: 13 mg/g creat (ref 0–29)
Microalbumin, Urine: 67.7 ug/mL

## 2019-05-02 DIAGNOSIS — E1165 Type 2 diabetes mellitus with hyperglycemia: Secondary | ICD-10-CM | POA: Diagnosis not present

## 2019-05-05 ENCOUNTER — Ambulatory Visit: Payer: Medicaid Other | Admitting: Sports Medicine

## 2019-05-05 ENCOUNTER — Encounter: Payer: Self-pay | Admitting: Sports Medicine

## 2019-05-05 ENCOUNTER — Other Ambulatory Visit: Payer: Self-pay

## 2019-05-05 VITALS — BP 112/67 | Ht 69.0 in | Wt 317.0 lb

## 2019-05-05 DIAGNOSIS — M25511 Pain in right shoulder: Secondary | ICD-10-CM

## 2019-05-05 NOTE — Progress Notes (Addendum)
[facility] [address] [place], [place] [address] Phone: [phone] Fax: [phone]   Patient Name: Melinda [name] Date of Birth: [birth year] Medical Record Number: [id number] Gender: female Date of Encounter: 05/05/2019  CC: Right shoulder pain  HPI: Pt is here c/o right shoulder pain. Pain started 3 months ago, but has worsened over the last 2-3 weeks. No MOI or inciting event. Aggravating factors include reaching her arm overhead to the side and reaching across her body. Alleviating factors include rest.  SHe intermittently has shooting pain down into her fingers, and states she has been told she has arthritis in her neck.  Did have a fall in January of this year, but did not fall onto an outstretched hand.  Her PCP was concerned for adhesive capsulitis given her chronic medical conditions including diabetes. No hx of trauma or injury to area in the past.  She does ambulate with a rolling walker due to chronic low back pain that she is currently in physical therapy for.  Past Medical History:  Diagnosis Date  . ADHD (attention deficit hyperactivity disorder)   . Arthritis   . Asthma   . Cancer ([place])   . Chicken pox AGE 49  . Diabetes mellitus without complication ([place])   . GERD (gastroesophageal reflux disease)   . Glaucoma    BOTH EYES, NO EYE DROPS  . Glaucoma   . History of blood transfusion MARCH 2016    2UNITS GIVEN AND IRON GIVEN  . Hyperglycemia 09/09/2014  . Hypertension   . 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 Iron deficiency anemia due to chronic blood loss 09/09/2014  . Low TSH level 09/08/2014  . Lumbar herniated disc   . Menorrhagia 09/08/2014  . Migraine    CLUSTER AND MIGRAINES  . Multiple thyroid nodules   . Ovarian mass, right 09/09/2014  . PE (pulmonary embolism) 2016  . Peripheral neuropathy    FINGER TIPS AND TOES NUMB SOME  . Preeclampsia  1983    WITH PREGNANCY  . PTSD (post-traumatic stress disorder)   . Scoliosis   . Seizures ([place]) AGE 49   NONE SINCE, HAD CHICKEN POX THEN    Current Outpatient Medications on File Prior to Visit  Medication Sig Dispense Refill  . ACCU-CHEK AVIVA PLUS test strip USE TO TEST BLOOD GLUCOSE FOUR TIMES DAILY BEFORE MEALS AND AT BEDTIME 100 each 12  . ACCU-CHEK SOFTCLIX LANCETS lancets   3  . albuterol (PROVENTIL) (2.5 MG/3ML) 0.083% nebulizer solution TAKE 3 MLS (2.5 MG TOTAL) BY NEBULIZATION EVERY 6 HOURS AS NEEDED FORWHEEZING OR SHORTNESS OF BREATH (Patient taking differently: Take 2.5 mg by nebulization every 6 (six) hours as needed for wheezing or shortness of breath. ) 150 mL 1  . atorvastatin (LIPITOR) 20 MG tablet TAKE 1 TABLET BY MOUTH ONCE A DAY 30 tablet 2  . budesonide-formoterol (SYMBICORT) 160-4.5 MCG/ACT inhaler Inhale 2 puffs into the lungs 2 (two) times daily. 2 Inhaler 0  . buPROPion (WELLBUTRIN XL) 300 MG 24 hr tablet Take 300 mg by mouth daily.   2  . busPIRone (BUSPAR) 5 MG tablet Take 5 mg by mouth 3 (three) times daily.    . citalopram (CELEXA) 40 MG tablet Take 40 mg by mouth daily.   2  . doxepin (SINEQUAN) 10 MG capsule Take 10 mg by mouth.    Arne Cleveland 5 MG TABS tablet  TAKE 1 TABLET BY MOUTH 2 TIMES DAILY. 60 tablet 2  . fluticasone (CUTIVATE) 0.05 % cream Apply topically 2 (two) times daily. 30 g 2  . gabapentin (NEURONTIN) 100 MG capsule TAKE 1 CAPSULE (100 MG TOTAL) BY MOUTH 3 (THREE) TIMES DAILY. 90 capsule 5  . hydrochlorothiazide (MICROZIDE) 12.5 MG capsule TAKE 1 CAPSULE BY MOUTH ONCE DAILY 30 capsule 2  . loratadine (CLARITIN) 10 MG tablet Take 1 tablet (10 mg total) by mouth daily. 30 tablet 11  . methocarbamol (ROBAXIN) 500 MG tablet TAKE 2 TABLETS (1,000 MG TOTAL) BY MOUTH EVERY 8 (EIGHT) HOURS AS NEEDED FOR MUSCLE SPASMS. 90 tablet 0  . metoprolol tartrate (LOPRESSOR) 50 MG tablet TAKE 1 TABLET BY MOUTH TWO TIMES DAILY 60 tablet 2  . montelukast (SINGULAIR) 10 MG  tablet TAKE 1 TABLET BY MOUTH EVERY EVENING AT BEDTIME 30 tablet 2  . PAZEO 0.7 % SOLN     . prazosin (MINIPRESS) 1 MG capsule Take 1 mg by mouth at bedtime.    Marland Kitchen SYNTHROID 200 MCG tablet     . topiramate (TOPAMAX) 200 MG tablet TAKE 1 TABLET BY MOUTH AT BEDTIME 30 tablet 3  . traMADol (ULTRAM) 50 MG tablet TAKE 1 TABLET BY MOUTH DAILY AS NEEDED FOR MODERATE OR SEVERE PAIN. 30 tablet 1   No current facility-administered medications on file prior to visit.     Past Surgical History:  Procedure Laterality Date  . ANGIOGRAM TO LEG  08-13-15   RIGHT  . CHOLECYSTECTOMY    . IR GENERIC HISTORICAL  04/04/2016   IR RADIOLOGIST EVAL & MGMT 04/04/2016 [name], MD [facility] INTERV RAD  . THYROIDECTOMY N/A 11/17/2015   Procedure: TOTAL THYROIDECTOMY;  Surgeon: Armandina Gemma, MD;  Location: [facility];  Service: General;  Laterality: N/A;  . UTERINE ARTERY EMBOLIZATION Bilateral 08/13/2015    Allergies  Allergen Reactions  . Ceftriaxone Anaphylaxis    ROCEPHIN  . Penicillins Shortness Of Breath    Has patient had a PCN reaction causing immediate rash, facial/tongue/throat swelling, SOB or lightheadedness with hypotension: Yes Has patient had a PCN reaction causing severe rash involving mucus membranes or skin necrosis: No Has patient had a PCN reaction that required hospitalization No Has patient had a PCN reaction occurring within the last 10 years: No If all of the above answers are "NO", then may proceed with Cephalosporin use.   . Shellfish Allergy Anaphylaxis  . Flonase [Fluticasone Propionate]     Makes migraines worse  . Gold-Containing Drug Products     HANDS ITCH  . Nickel     HANDS SWELL  . Citrus Rash    Social History   Socioeconomic History  . Marital status: Single    Spouse name: Not on file  . Number of children: 1  . Years of education: 51  . Highest education level: Some college, no degree  Occupational History  . Occupation: disabled  Social Needs  . Financial  resource strain: Not on file  . Food insecurity    Worry: Not on file    Inability: Not on file  . Transportation needs    Medical: Not on file    Non-medical: Not on file  Tobacco Use  . Smoking status: Former Smoker    Packs/day: 0.50    Years: 40.00    Pack years: 20.00    Types: Cigarettes    Quit date: 11/03/2014    Years since quitting: 4.5  . Smokeless tobacco: Never Used  Substance  and Sexual Activity  . Alcohol use: No    Alcohol/week: 0.0 standard drinks  . Drug use: No  . Sexual activity: Never    Birth control/protection: None  Lifestyle  . Physical activity    Days per week: Not on file    Minutes per session: Not on file  . Stress: Not on file  Relationships  . Social Herbalist on phone: Not on file    Gets together: Not on file    Attends religious service: Not on file    Active member of club or organization: Not on file    Attends meetings of clubs or organizations: Not on file    Relationship status: Not on file  . Intimate partner violence    Fear of current or ex partner: Not on file    Emotionally abused: Not on file    Physically abused: Not on file    Forced sexual activity: Not on file  Other Topics Concern  . Not on file  Social History Narrative   Patient is right-handed. She lives in a one level handicap accessible apartment. She avoids caffeine. She uses stationary stationary pedals to use for exercise.    Family History  Problem Relation Age of Onset  . Diabetes Mother   . Breast cancer Mother   . CAD Mother   . Hypertension Mother   . Alcohol abuse Father   . Hypertension Father   . Breast cancer Maternal Aunt   . Breast cancer Maternal Aunt     BP 112/67   Ht 5\' 9"  (1.753 m)   Wt (!) 317 lb (143.8 kg)   BMI 46.81 kg/m   ROS:  See HPI CONST: no F/C, no malaise, no fatigue MSK: See above NEURO: See above SKIN: no rash, no lesions HEME: no bleeding, no bruising, no erythema  Objective: GEN: Alert and  oriented, NAD, morbidly obese Pulm: Breathing unlabored PSY: normal mood, congruent affect  MSK Right shoulder No swelling, ecchymoses.  No gross deformity. Mild TTP at lateral proximal shoulder and over [facility] joint. FROM an flexion and external rotation Unable to abduct her arm more than 20 degrees, but passively has full abduction Pain with horizontal adduction Negative Hawkins, Neers. Negative Yergasons. Unable to perform empty can due to patient compliance Strength is 3/5 in abduction Negative apprehension. NV intact distally.  Left shoulder Well developed, well nourished, in no acute distress. No swelling, ecchymoses.  No gross deformity. No TTP. Decreased ROM in abduction Negative Hawkins, Neers. Negative Yergasons. Strength 4/5 in empty can. Negative apprehension. NV intact distally.  Limited MSK Ultrasound: Right shoulder Small joint effusion.   The biceps brachii long head tendon is normal without tendinosis, tear, tenosynovitis, or subluxation/dislocation in short and long axis view. infraspinatus, subscapularis, and teres minor tendons visualized without abnormality There is a partial tear of the supraspinatus seen with hypoechoic changes Mild subacromial bursal abnormality Posterior labrum is unremarkable AC joint visualized in the long axis with moderate effusion  Impression: Partial tear of the supraspinatus with AC joint effusion    Assessment and Plan:  1.  Right shoulder pain  There is concern for a partial tear of the supraspinatus tendon and AC joint arthropathy.  Given that patient is a poor surgical candidate, I have prescribed physical therapy for the shoulder.  She can use topical Voltaren and continue to use her TENS unit.  We will follow-up in 5 weeks at which time we can consider injection versus MRI  for further evaluation.   Lanier Clam, DO, ATC Sports Medicine Fellow  Addendum:  Patient seen in the office by fellow.  His history, exam,  plan of care were precepted with me.  Karlton Lemon MD Kirt Boys

## 2019-05-07 ENCOUNTER — Other Ambulatory Visit: Payer: Self-pay

## 2019-05-07 ENCOUNTER — Ambulatory Visit: Payer: Medicaid Other | Attending: Internal Medicine | Admitting: Physical Therapy

## 2019-05-07 ENCOUNTER — Encounter: Payer: Self-pay | Admitting: Physical Therapy

## 2019-05-07 DIAGNOSIS — M545 Low back pain, unspecified: Secondary | ICD-10-CM

## 2019-05-07 DIAGNOSIS — M6281 Muscle weakness (generalized): Secondary | ICD-10-CM | POA: Insufficient documentation

## 2019-05-07 DIAGNOSIS — R262 Difficulty in walking, not elsewhere classified: Secondary | ICD-10-CM | POA: Diagnosis not present

## 2019-05-07 DIAGNOSIS — G8929 Other chronic pain: Secondary | ICD-10-CM | POA: Diagnosis not present

## 2019-05-07 DIAGNOSIS — M25511 Pain in right shoulder: Secondary | ICD-10-CM | POA: Diagnosis not present

## 2019-05-07 DIAGNOSIS — R269 Unspecified abnormalities of gait and mobility: Secondary | ICD-10-CM

## 2019-05-07 NOTE — Therapy (Signed)
Melinda [name], [place], [address] Phone: [phone]   Fax:  [phone]  Physical Therapy Evaluation  Patient Details  Name: Melinda [name] MRN: [id number] Date of Birth: [birth year] Referring Provider (PT): [name], MD (for back / abnromal gait),  (pManeen, Dominic, DO (for R shoulder))   Encounter Date: 05/07/2019  PT End of Session - 05/07/19 1330    Visit Number  1    Number of Visits  13    Date for PT Re-Evaluation  07/02/19    Authorization Type  MCD: resubmit at 4th visit    PT Start Time  1016    PT Stop Time  1058    PT Time Calculation (min)  42 min    Activity Tolerance  Patient tolerated treatment well;Patient limited by pain    Behavior During Therapy  [facility] for tasks assessed/performed       Past Medical History:  Diagnosis Date  . ADHD (attention deficit hyperactivity disorder)   . Arthritis   . Asthma   . Cancer ([place])   . Chicken pox AGE 53  . COPD (chronic obstructive pulmonary disease) ([place])    pt reported  . Diabetes mellitus without complication ([place])   . GERD (gastroesophageal reflux disease)   . Glaucoma    BOTH EYES, NO EYE DROPS  . Glaucoma   . History of blood transfusion MARCH 2016    2UNITS GIVEN AND IRON GIVEN  . Hyperglycemia 09/09/2014  . Hypertension   . 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 Iron deficiency anemia due to chronic blood loss 09/09/2014  . Low TSH level 09/08/2014  . Lumbar herniated disc   . Menorrhagia 09/08/2014  . Migraine    CLUSTER AND MIGRAINES  . Multiple thyroid nodules   . Ovarian mass, right 09/09/2014  . PE (pulmonary embolism) 2016  . Peripheral neuropathy    FINGER TIPS AND TOES NUMB SOME  . Preeclampsia  1983   WITH PREGNANCY  . PTSD (post-traumatic stress disorder)   . Scoliosis   . Seizures ([place]) AGE 42   NONE SINCE, HAD CHICKEN POX THEN    Past  Surgical History:  Procedure Laterality Date  . ANGIOGRAM TO LEG  08-13-15   RIGHT  . CHOLECYSTECTOMY    . IR GENERIC HISTORICAL  04/04/2016   IR RADIOLOGIST EVAL & MGMT 04/04/2016 [name], MD [facility] INTERV RAD  . THYROIDECTOMY N/A 11/17/2015   Procedure: TOTAL THYROIDECTOMY;  Surgeon: Armandina Gemma, MD;  Location: [facility];  Service: General;  Laterality: N/A;  . UTERINE ARTERY EMBOLIZATION Bilateral 08/13/2015    There were no vitals filed for this visit.   Subjective Assessment - 05/07/19 1026    Subjective  pt is a 53 y.o with CC of low back pain pain and recent R shoulder pain. Low back pain started when she was 8 slipping on a diving board. She reports stays mostly in the back but does have some occasional pain inthe hips and feeling unsteadiness and had 2 falls in the last 6 months. She had been seen previously 2018 for PT involving her back, pain seems to be staying consistent with no major changes. The R shoulder pain started after she had a fall back in January which she is noted she is unsure on how she feel and has increased pain inthe R shoulder. Since onset the pain inthe  shoulder is worsening.    Limitations  Lifting;Walking    How long can you sit comfortably?  10 min    How long can you stand comfortably?  5-8 min    How long can you walk comfortably?  5-8 min    Diagnostic tests  [place] on R shoulder    Patient Stated Goals  to have less pain, to be able use the R arm    Currently in Pain?  Yes    Pain Score  3    at worst 10/10   Pain Location  Shoulder    Pain Orientation  Right    Pain Descriptors / Indicators  Sharp;Aching;Sore   killer   Pain Type  Chronic pain    Pain Onset  More than a month ago    Pain Frequency  Intermittent    Aggravating Factors   R shoulder movement or activation, laying onthe R side    Pain Relieving Factors  avoiding movements, medication, TENS    Effect of Pain on Daily Activities  limited RUE use/ lifting    Multiple Pain Sites  Yes     Pain Score  6    Pain Location  Back    Pain Orientation  Left;Right    Pain Descriptors / Indicators  Aching;Tightness;Tender;Sore    Pain Type  Chronic pain    Pain Radiating Towards  bil hips    Pain Onset  More than a month ago    Pain Frequency  Intermittent    Aggravating Factors   standing/ walking, laying down    Pain Relieving Factors  medication,    Effect of Pain on Daily Activities  limited walking/ standing, feeling off balance         [facility] PT Assessment - 05/07/19 1038      Assessment   Medical Diagnosis  R shoulder pain, Chronic bilateral low back pain without sciatica M54.5, G89.29, Gait disturbance R26.9    Referring Provider (PT)  [name], MD (for back / abnromal gait),    pManeen, Dominic, DO (for R shoulder)   Onset Date/Surgical Date  --   R shoulder in January and low back for many eyars   Hand Dominance  Right    Next MD Visit  --   Maneen 5 weeks, DR Wynetta Emery 06/2019   Prior Therapy  yes      Precautions   Precautions  None      Restrictions   Weight Bearing Restrictions  No      Balance Screen   Has the patient fallen in the past 6 months  Yes    How many times?  2    Has the patient had a decrease in activity level because of a fear of falling?   Yes    Is the patient reluctant to leave their home because of a fear of falling?   Yes      [place]  Private residence    Living Arrangements  Alone    Type of [place] - 4 wheels;Cane - single point;Grab bars - tub/shower;Grab bars - toilet;Shower seat      Prior Function   Level of Independence  Independent with basic ADLs    Vocation  On disability      Cognition   Overall Cognitive Status  Within Functional Limits for tasks assessed      Posture/Postural Control   Posture/Postural Control  Postural limitations    Postural Limitations  Forward head;Rounded  Shoulders;Flexed trunk      ROM / Strength   AROM / PROM / Strength  AROM;Strength;PROM      AROM   AROM Assessment Site  Shoulder    Right/Left Shoulder  Left;Right    Right Shoulder Flexion  115 Degrees    Right Shoulder ABduction  70 Degrees    Right Shoulder Internal Rotation  --   greater trochanter   Right Shoulder External Rotation  --   to R parietal bone   Left Shoulder Flexion  150 Degrees    Left Shoulder ABduction  127 Degrees    Left Shoulder Internal Rotation  --   T8   Left Shoulder External Rotation  --   C7     Strength   Strength Assessment Site  Shoulder;Hand    Right/Left Shoulder  Right;Left    Right Shoulder Flexion  3+/5    Right Shoulder Extension  3+/5    Right Shoulder ABduction  3-/5    Right Shoulder Internal Rotation  3-/5    Right Shoulder External Rotation  3-/5    Left Shoulder Flexion  4/5    Left Shoulder Extension  4/5    Left Shoulder ABduction  4/5    Left Shoulder Internal Rotation  4/5    Left Shoulder External Rotation  4/5    Right Hand Grip (lbs)  35    Left Hand Grip (lbs)  64      Palpation   Palpation comment  TTP along the greater tubercle and along the infraspinatus and teres minor, upper trap tightness with mutliple trigger points., low back TTP along bil lumbar paraspinals and along bil glute med      Ambulation/Gait   Ambulation/Gait  Yes    Assistive device  Rollator    Gait Pattern  Step-through pattern;Decreased stride length;Trendelenburg;Antalgic;Decreased trunk rotation;Trunk flexed                Objective measurements completed on examination: See above findings.              PT Education - 05/07/19 1329    Education Details  evaluation findings, POC, goals, HEP with proper form/ rationale.    Person(s) Educated  Patient    Methods  Explanation;Verbal cues;Handout    Comprehension  Verbalized understanding;Verbal cues required       PT Short Term Goals - 05/07/19 1344      PT SHORT  TERM GOAL #1   Title  independent with HEP    Baseline  no previous HEP    Time  3    Period  Weeks    Status  New    Target Date  05/28/19      PT SHORT TERM GOAL #2   Title  pt to verbalize/ demo techniques to reduce R shoulder pain/ inflammation via RICE and HEP    Baseline  uses medication to help control pain    Time  3    Period  Weeks    Status  New    Target Date  05/28/19      PT SHORT TERM GOAL #3   Title  pt to demonstrate proper posture and gait mechanics with LRAD to reduce and prevent low back/ shoulder pain    Baseline  no knowledge of proper posture  Time  3    Period  Weeks        PT Long Term Goals - 05/07/19 1350      PT LONG TERM GOAL #1   Title  incease R shoulder AROM flexion to >/= 120 and  abduction to >/= 110 degrees for functional mobility with </= 3/10 pain    Baseline  R shoulder flexion 115  abduction 70    Time  6    Period  Weeks    Status  New    Target Date  07/02/19      PT LONG TERM GOAL #2   Title  increase trunk mobility to [facility] with </=4/10 pain    Baseline  limited trunk mobility with pain at 6/10    Time  6    Period  Weeks    Status  New    Target Date  07/02/19      PT LONG TERM GOAL #3   Title  increase R shoulder strength to >/= 4/5 (in available ROM)    Baseline  flexion and extension 3+/5, IR/ER and abduction 3-/5    Time  6    Period  Weeks    Status  New    Target Date  07/02/19      PT LONG TERM GOAL #4   Title  pt to be able to walk/ stand for >/= 15 min with LRAD for in home and community amb    Baseline  able to walk ~5-8 min with rollator    Time  6    Period  Weeks    Status  New    Target Date  07/02/19      PT LONG TERM GOAL #5   Title  pt to be I with all HEP given as of last visit to maintain and progress current level of function    Baseline  no previous HEP    Time  6    Period  Weeks    Status  New    Target Date  07/02/19             Plan - 05/07/19 1331    Clinical Impression  Statement  pt presents to OPPT with CC of R shoulder and low back pain with hx of falling. R shoulder pain started after pt fell which she reported had US imaging indicating she had a RCT. She has significant limited R shoulder mobility, and associated weakness secondaryto pain. TTP noted along infraspinatus/ teres minor to the greater tubercle, low back TTP along bil lumbar paraspinals and along bil glute med. plan to assess low back mobility and balance next session. She would benefit from physical theray    Personal Factors and Comorbidities  Comorbidity 3+;Age;Fitness    Comorbidities  Hx of Cx,seizures, DM, incomplete spinal cord lesion t7-t12    Examination-Activity Limitations  Sit;Squat;Stairs;Stand;Lift;Bend    Examination-Participation Restrictions  Medication Management    Stability/Clinical Decision Making  Unstable/Unpredictable    Clinical Decision Making  High    Rehab Potential  Excellent    PT Frequency  2x / week    PT Duration  8 weeks   inital MCD auth 1 x a week for 3 weeks.   PT Treatment/Interventions  ADLs/Self Care Home Management;Cryotherapy;Electrical Stimulation;Iontophoresis 4mg /ml Dexamethasone;Moist Heat;Ultrasound;DME Instruction;Gait training;Stair training;Functional mobility training;Therapeutic activities;Therapeutic exercise;Balance training;Neuromuscular re-education;Patient/family education;Manual techniques;Passive range of motion;Dry needling;Taping    PT Next Visit Plan  review/ update HEP PRN: Assess trunk ROM, BERG balance (add  to LTG), shoulder PROM -> AAROM, scapular setting, core activation, modalities PRN    PT Home Exercise Plan  [id number]: Shoulder table slides flexion/ abduction, scapular retraction, LTR, ab set    Consulted and Agree with Plan of Care  Patient       Patient will benefit from skilled therapeutic intervention in order to improve the following deficits and impairments:  Abnormal gait, Decreased activity tolerance, Decreased balance,  Decreased endurance, Decreased range of motion, Decreased safety awareness, Decreased strength, Difficulty walking, Increased fascial restricitons, Increased muscle spasms, Impaired flexibility, Postural dysfunction, Obesity, Pain, Improper body mechanics  Visit Diagnosis: Abnormality of gait  Chronic bilateral low back pain, unspecified whether sciatica present  Chronic right shoulder pain  Muscle weakness (generalized)     Problem List Patient Active Problem List   Diagnosis Date Noted  . Adrenal mass, right ([place]) 04/29/2019  . Chronic cough 03/12/2018  . Left shoulder pain 10/14/2016  . Hypertension 10/14/2016  . S/P thyroidectomy 06/27/2016  . Chronic bilateral low back pain without sciatica 04/22/2016  . Liver lesion 12/24/2015  . Papillary thyroid carcinoma ([place]) 12/21/2015  . Controlled type 2 diabetes mellitus with complication, with long-term current use of insulin ([place]) 10/10/2015  . Vision loss 08/25/2015  . Decreased vision in both eyes 08/25/2015  . Prolonged Q-T interval on ECG 08/19/2015  . History of pulmonary embolism 08/10/2015  . Mild persistent asthma 04/13/2015  . Fibroid, uterine   . Morbid obesity ([place]) 11/11/2014  . Sinus tachycardia 11/08/2014  . Migraine 10/14/2014  . Menorrhagia 09/08/2014   Starr Lake PT, DPT, LAT, ATC  05/07/19  2:06 PM      [facility] [address] [place], [place], [address] Phone: [phone]   Fax:  [phone]  Name: Noriah Stjulian MRN: [id number] Date of Birth: Sep 18, 1965

## 2019-05-19 ENCOUNTER — Encounter: Payer: Self-pay | Admitting: Physical Therapy

## 2019-05-19 ENCOUNTER — Ambulatory Visit: Payer: Medicaid Other | Admitting: Physical Therapy

## 2019-05-19 ENCOUNTER — Other Ambulatory Visit: Payer: Self-pay

## 2019-05-19 DIAGNOSIS — R269 Unspecified abnormalities of gait and mobility: Secondary | ICD-10-CM | POA: Diagnosis not present

## 2019-05-19 DIAGNOSIS — M25511 Pain in right shoulder: Secondary | ICD-10-CM | POA: Diagnosis not present

## 2019-05-19 DIAGNOSIS — M6281 Muscle weakness (generalized): Secondary | ICD-10-CM

## 2019-05-19 DIAGNOSIS — M545 Low back pain, unspecified: Secondary | ICD-10-CM

## 2019-05-19 DIAGNOSIS — R262 Difficulty in walking, not elsewhere classified: Secondary | ICD-10-CM | POA: Diagnosis not present

## 2019-05-19 DIAGNOSIS — G8929 Other chronic pain: Secondary | ICD-10-CM | POA: Diagnosis not present

## 2019-05-19 NOTE — Therapy (Signed)
[place], [place], [address] Phone: [phone]   Fax:  [phone]  Physical Therapy Treatment  Patient Details  Name: Melinda [name] MRN: [id number] Date of Birth: May 31, 1966 Referring Provider (PT): [name], MD (for back / abnromal gait),  (pManeen, Dominic, DO (for R shoulder))   Encounter Date: 05/19/2019  PT End of Session - 05/19/19 0854    Visit Number  2    Number of Visits  13    Date for PT Re-Evaluation  07/02/19    Authorization Type  MCD: resubmit at 4th visit    Authorization Time Period  05/18/19-06/07/19    Authorization - Visit Number  1    Authorization - Number of Visits  3    PT Start Time  0802    PT Stop Time  [id number]    PT Time Calculation (min)  41 min       Past Medical History:  Diagnosis Date  . ADHD (attention deficit hyperactivity disorder)   . Arthritis   . Asthma   . Cancer ([place])   . Chicken pox AGE 59  . COPD (chronic obstructive pulmonary disease) ([place])    pt reported  . Diabetes mellitus without complication ([place])   . GERD (gastroesophageal reflux disease)   . Glaucoma    BOTH EYES, NO EYE DROPS  . Glaucoma   . History of blood transfusion MARCH 2016    2UNITS GIVEN AND IRON GIVEN  . Hyperglycemia 09/09/2014  . Hypertension   . 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 Iron deficiency anemia due to chronic blood loss 09/09/2014  . Low TSH level 09/08/2014  . Lumbar herniated disc   . Menorrhagia 09/08/2014  . Migraine    CLUSTER AND MIGRAINES  . Multiple thyroid nodules   . Ovarian mass, right 09/09/2014  . PE (pulmonary embolism) 2016  . Peripheral neuropathy    FINGER TIPS AND TOES NUMB SOME  . Preeclampsia  1983   WITH PREGNANCY  . PTSD (post-traumatic stress disorder)   . Scoliosis   . Seizures ([place]) AGE 53   NONE SINCE, HAD CHICKEN POX THEN    Past Surgical History:   Procedure Laterality Date  . ANGIOGRAM TO LEG  08-13-15   RIGHT  . CHOLECYSTECTOMY    . IR GENERIC HISTORICAL  04/04/2016   IR RADIOLOGIST EVAL & MGMT 04/04/2016 [name], MD [facility] INTERV RAD  . THYROIDECTOMY N/A 11/17/2015   Procedure: TOTAL THYROIDECTOMY;  Surgeon: Armandina Gemma, MD;  Location: [facility];  Service: General;  Laterality: N/A;  . UTERINE ARTERY EMBOLIZATION Bilateral 08/13/2015    There were no vitals filed for this visit.  Subjective Assessment - 05/19/19 0807    Currently in Pain?  Yes    Pain Score  0-No pain   hurts with reaching   Pain Location  Shoulder    Pain Orientation  Right    Pain Descriptors / Indicators  Sharp    Aggravating Factors   reach without thinking about it    Pain Relieving Factors  avoiding movements , meds, TENS    Pain Score  6    Pain Location  Back    Pain Orientation  Right;Left    Pain Descriptors / Indicators  Aching;Tender;Tightness;Sore    Aggravating Factors   standing , walking , PMS    Pain Relieving Factors  meds  [facility] PT Assessment - 05/19/19 0001      Standardized Balance Assessment   Standardized Balance Assessment  Berg Balance Test      Berg Balance Test   Sit to Stand  Able to stand  independently using hands    Standing Unsupported  Able to stand 30 seconds unsupported    Sitting with Back Unsupported but Feet Supported on Floor or Stool  Able to sit safely and securely 2 minutes    Stand to Sit  Uses backs of legs against chair to control descent    Transfers  Able to transfer safely, definite need of hands    Standing Unsupported with Eyes Closed  Able to stand 10 seconds with supervision    Standing Unsupported with Feet Together  Needs help to attain position but able to stand for 30 seconds with feet together    From Standing, Reach Forward with Outstretched Arm  Can reach forward >5 cm safely (2")    From Standing Position, Pick up Object from Floor  Unable to try/needs assist to keep balance     From Standing Position, Turn to Look Behind Over each Shoulder  Turn sideways only but maintains balance    Turn 360 Degrees  Needs close supervision or verbal cueing    Standing Unsupported, Alternately Place Feet on Step/Stool  Needs assistance to keep from falling or unable to try    Standing Unsupported, One Foot in [place] to take small step independently and hold 30 seconds    Standing on One Leg  Unable to try or needs assist to prevent fall    Total Score  25                   [facility] Adult PT Treatment/Exercise - 05/19/19 0001      Lumbar Exercises: Stretches   Lower Trunk Rotation  10 seconds    Lower Trunk Rotation Limitations  10 reps       Shoulder Exercises: Seated   Retraction  10 reps    Other Seated Exercises  Seated shoulder flexion AAROM-rolling rollator away and back               PT Short Term Goals - 05/07/19 1344      PT SHORT TERM GOAL #1   Title  independent with HEP    Baseline  no previous HEP    Time  3    Period  Weeks    Status  New    Target Date  05/28/19      PT SHORT TERM GOAL #2   Title  pt to verbalize/ demo techniques to reduce R shoulder pain/ inflammation via RICE and HEP    Baseline  uses medication to help control pain    Time  3    Period  Weeks    Status  New    Target Date  05/28/19      PT SHORT TERM GOAL #3   Title  pt to demonstrate proper posture and gait mechanics with LRAD to reduce and prevent low back/ shoulder pain    Baseline  no knowledge of proper posture    Time  3    Period  Weeks        PT Long Term Goals - 05/07/19 1350      PT LONG TERM GOAL #1   Title  incease R shoulder AROM flexion to >/= 120 and  abduction to >/= 110 degrees for functional  mobility with </= 3/10 pain    Baseline  R shoulder flexion 115  abduction 70    Time  6    Period  Weeks    Status  New    Target Date  07/02/19      PT LONG TERM GOAL #2   Title  increase trunk mobility to [facility] with </=4/10 pain     Baseline  limited trunk mobility with pain at 6/10    Time  6    Period  Weeks    Status  New    Target Date  07/02/19      PT LONG TERM GOAL #3   Title  increase R shoulder strength to >/= 4/5 (in available ROM)    Baseline  flexion and extension 3+/5, IR/ER and abduction 3-/5    Time  6    Period  Weeks    Status  New    Target Date  07/02/19      PT LONG TERM GOAL #4   Title  pt to be able to walk/ stand for >/= 15 min with LRAD for in home and community amb    Baseline  able to walk ~5-8 min with rollator    Time  6    Period  Weeks    Status  New    Target Date  07/02/19      PT LONG TERM GOAL #5   Title  pt to be I with all HEP given as of last visit to maintain and progress current level of function    Baseline  no previous HEP    Time  6    Period  Weeks    Status  New    Target Date  07/02/19            Plan - 05/19/19 [id number]    Clinical Impression Statement  BERG 25/56, pt has rollator outside of home and uses [facility] and furniture walking in home. Review of HEP today and she reports compliance and notes some decreased shoulder pain.    PT Next Visit Plan  Needs PT to reinforce need for full time RW due to BERG score. review/ update HEP PRN: Asses trunk ROM, add LTG for BERG balance score, , shoulder PROM -> AAROM, scapular setting, core activation, modalities PRN    PT Home Exercise Plan  [id number]: Shoulder table slides flexion/ abduction, scapular retraction, LTR, ab set       Patient will benefit from skilled therapeutic intervention in order to improve the following deficits and impairments:  Abnormal gait, Decreased activity tolerance, Decreased balance, Decreased endurance, Decreased range of motion, Decreased safety awareness, Decreased strength, Difficulty walking, Increased fascial restricitons, Increased muscle spasms, Impaired flexibility, Postural dysfunction, Obesity, Pain, Improper body mechanics  Visit Diagnosis: Abnormality of gait  Chronic  bilateral low back pain, unspecified whether sciatica present  Chronic right shoulder pain  Muscle weakness (generalized)  Difficulty in walking, not elsewhere classified  Chronic bilateral low back pain without sciatica     Problem List Patient Active Problem List   Diagnosis Date Noted  . Adrenal mass, right ([place]) 04/29/2019  . Chronic cough 03/12/2018  . Left shoulder pain 10/14/2016  . Hypertension 10/14/2016  . S/P thyroidectomy 06/27/2016  . Chronic bilateral low back pain without sciatica 04/22/2016  . Liver lesion 12/24/2015  . Papillary thyroid carcinoma ([place]) 12/21/2015  . Controlled type 2 diabetes mellitus with complication, with long-term current use of insulin ([place]) 10/10/2015  . Vision  loss 08/25/2015  . Decreased vision in both eyes 08/25/2015  . Prolonged Q-T interval on ECG 08/19/2015  . History of pulmonary embolism 08/10/2015  . Mild persistent asthma 04/13/2015  . Fibroid, uterine   . Morbid obesity ([place]) 11/11/2014  . Sinus tachycardia 11/08/2014  . Migraine 10/14/2014  . Menorrhagia 09/08/2014    Dorene Ar, PTA 05/19/2019, 8:58 AM  [facility] [address] [place], [place], [address] Phone: [phone]   Fax:  [phone]  Name: Melinda [name] MRN: [id number] Date of Birth: February 29, 1966

## 2019-05-24 ENCOUNTER — Encounter: Payer: Self-pay | Admitting: Physical Therapy

## 2019-05-24 ENCOUNTER — Other Ambulatory Visit: Payer: Self-pay

## 2019-05-24 ENCOUNTER — Ambulatory Visit: Payer: Medicaid Other | Admitting: Physical Therapy

## 2019-05-24 DIAGNOSIS — M6281 Muscle weakness (generalized): Secondary | ICD-10-CM | POA: Diagnosis not present

## 2019-05-24 DIAGNOSIS — R269 Unspecified abnormalities of gait and mobility: Secondary | ICD-10-CM | POA: Diagnosis not present

## 2019-05-24 DIAGNOSIS — G8929 Other chronic pain: Secondary | ICD-10-CM

## 2019-05-24 DIAGNOSIS — M25511 Pain in right shoulder: Secondary | ICD-10-CM

## 2019-05-24 DIAGNOSIS — M545 Low back pain, unspecified: Secondary | ICD-10-CM

## 2019-05-24 DIAGNOSIS — R262 Difficulty in walking, not elsewhere classified: Secondary | ICD-10-CM | POA: Diagnosis not present

## 2019-05-24 NOTE — Therapy (Signed)
[place], [place], [address] Phone: [phone]   Fax:  [phone]  Physical Therapy Treatment  Patient Details  Name: Melinda [name] MRN: [id number] Date of Birth: Apr 26, 1966 Referring Provider (PT): [name], MD (for back / abnromal gait),  (pManeen, Dominic, DO (for R shoulder))   Encounter Date: 05/24/2019  PT End of Session - 05/24/19 0956    Visit Number  3    Number of Visits  13    Date for PT Re-Evaluation  07/02/19    Authorization Time Period  05/18/19-06/07/19    Authorization - Visit Number  2    Authorization - Number of Visits  3    PT Start Time  0849    PT Stop Time  0930    PT Time Calculation (min)  41 min       Past Medical History:  Diagnosis Date  . ADHD (attention deficit hyperactivity disorder)   . Arthritis   . Asthma   . Cancer ([place])   . Chicken pox AGE 89  . COPD (chronic obstructive pulmonary disease) ([place])    pt reported  . Diabetes mellitus without complication ([place])   . GERD (gastroesophageal reflux disease)   . Glaucoma    BOTH EYES, NO EYE DROPS  . Glaucoma   . History of blood transfusion MARCH 2016    2UNITS GIVEN AND IRON GIVEN  . Hyperglycemia 09/09/2014  . Hypertension   . 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 Iron deficiency anemia due to chronic blood loss 09/09/2014  . Low TSH level 09/08/2014  . Lumbar herniated disc   . Menorrhagia 09/08/2014  . Migraine    CLUSTER AND MIGRAINES  . Multiple thyroid nodules   . Ovarian mass, right 09/09/2014  . PE (pulmonary embolism) 2016  . Peripheral neuropathy    FINGER TIPS AND TOES NUMB SOME  . Preeclampsia  1983   WITH PREGNANCY  . PTSD (post-traumatic stress disorder)   . Scoliosis   . Seizures ([place]) AGE 25   NONE SINCE, HAD CHICKEN POX THEN    Past Surgical History:  Procedure Laterality Date  . ANGIOGRAM TO LEG   08-13-15   RIGHT  . CHOLECYSTECTOMY    . IR GENERIC HISTORICAL  04/04/2016   IR RADIOLOGIST EVAL & MGMT 04/04/2016 [name], MD [facility] INTERV RAD  . THYROIDECTOMY N/A 11/17/2015   Procedure: TOTAL THYROIDECTOMY;  Surgeon: Armandina Gemma, MD;  Location: [facility];  Service: General;  Laterality: N/A;  . UTERINE ARTERY EMBOLIZATION Bilateral 08/13/2015    There were no vitals filed for this visit.                    [facility] Adult PT Treatment/Exercise - 05/24/19 0001      Lumbar Exercises: Stretches   Lower Trunk Rotation  10 seconds    Lower Trunk Rotation Limitations  10 reps       Lumbar Exercises: Aerobic   Nustep  5 minutes L2 LE only       Lumbar Exercises: Supine   Pelvic Tilt  15 reps    Pelvic Tilt Limitations  cues for abdominal and gluteal activation, good form    Clam  20 reps    Clam Limitations  cues for abdominal draw in    Bent Knee Raise  20 reps    Bent Knee Raise  Limitations  cues for abdominal draw in    Bridge  10 reps    Bridge Limitations  cues for initial poelvic tilt      Shoulder Exercises: Supine   Other Supine Exercises  supine chest press , small pullovers       Shoulder Exercises: Seated   Retraction  10 reps    Other Seated Exercises  Seated shoulder flexion AAROM-rolling rollator away and back    Other Seated Exercises  seated shoulder rolls       Shoulder Exercises: Pulleys   Flexion  2 minutes    Flexion Limitations  started going >130 degrees and then became painful and unable to go beyond 90 due to pain.       Manual Therapy   Manual therapy comments  poor tolerance to gentle  upper trap soft tissue work       Neck Exercises: Stretches   Upper Trapezius Stretch  2 reps;20 seconds    Levator Stretch  2 reps;20 seconds          Balance Exercises - 05/24/19 0925      Balance Exercises: Standing   Other Standing Exercises  staggered stand and narrow stance (modified due to patient Right LE IR) 30 sec trials            PT Short Term Goals - 05/07/19 1344      PT SHORT TERM GOAL #1   Title  independent with HEP    Baseline  no previous HEP    Time  3    Period  Weeks    Status  New    Target Date  05/28/19      PT SHORT TERM GOAL #2   Title  pt to verbalize/ demo techniques to reduce R shoulder pain/ inflammation via RICE and HEP    Baseline  uses medication to help control pain    Time  3    Period  Weeks    Status  New    Target Date  05/28/19      PT SHORT TERM GOAL #3   Title  pt to demonstrate proper posture and gait mechanics with LRAD to reduce and prevent low back/ shoulder pain    Baseline  no knowledge of proper posture    Time  3    Period  Weeks        PT Long Term Goals - 05/07/19 1350      PT LONG TERM GOAL #1   Title  incease R shoulder AROM flexion to >/= 120 and  abduction to >/= 110 degrees for functional mobility with </= 3/10 pain    Baseline  R shoulder flexion 115  abduction 70    Time  6    Period  Weeks    Status  New    Target Date  07/02/19      PT LONG TERM GOAL #2   Title  increase trunk mobility to [facility] with </=4/10 pain    Baseline  limited trunk mobility with pain at 6/10    Time  6    Period  Weeks    Status  New    Target Date  07/02/19      PT LONG TERM GOAL #3   Title  increase R shoulder strength to >/= 4/5 (in available ROM)    Baseline  flexion and extension 3+/5, IR/ER and abduction 3-/5    Time  6    Period  Weeks  Status  New    Target Date  07/02/19      PT LONG TERM GOAL #4   Title  pt to be able to walk/ stand for >/= 15 min with LRAD for in home and community amb    Baseline  able to walk ~5-8 min with rollator    Time  6    Period  Weeks    Status  New    Target Date  07/02/19      PT LONG TERM GOAL #5   Title  pt to be I with all HEP given as of last visit to maintain and progress current level of function    Baseline  no previous HEP    Time  6    Period  Weeks    Status  New    Target Date  07/02/19             Plan - 05/24/19 0959    Clinical Impression Statement  Pt arrives reporting neck and bilat hip stiffness. Attempted manual soft tissue work to right upper trap; she was guarded and required cues to relax, only gentle pressure tolerated. Continued with Lumbar and hip mobility and she did well. Cues provided for abdominal draw in.  Began pulleys which started out well however became painful and she had to discontinue. Updated HEP.    PT Next Visit Plan  Needs primary PT to reinforce need for full time RW due to BERG score,.. review/ update HEP PRN: Asses trunk ROM, add LTG for BERG balance score, , shoulder PROM -> AAROM, scapular setting, core activation, modalities PRN       Patient will benefit from skilled therapeutic intervention in order to improve the following deficits and impairments:  Abnormal gait, Decreased activity tolerance, Decreased balance, Decreased endurance, Decreased range of motion, Decreased safety awareness, Decreased strength, Difficulty walking, Increased fascial restricitons, Increased muscle spasms, Impaired flexibility, Postural dysfunction, Obesity, Pain, Improper body mechanics  Visit Diagnosis: Abnormality of gait  Chronic bilateral low back pain, unspecified whether sciatica present  Chronic right shoulder pain  Muscle weakness (generalized)  Chronic bilateral low back pain without sciatica  Difficulty in walking, not elsewhere classified     Problem List Patient Active Problem List   Diagnosis Date Noted  . Adrenal mass, right ([place]) 04/29/2019  . Chronic cough 03/12/2018  . Left shoulder pain 10/14/2016  . Hypertension 10/14/2016  . S/P thyroidectomy 06/27/2016  . Chronic bilateral low back pain without sciatica 04/22/2016  . Liver lesion 12/24/2015  . Papillary thyroid carcinoma ([place]) 12/21/2015  . Controlled type 2 diabetes mellitus with complication, with long-term current use of insulin ([place]) 10/10/2015  . Vision loss 08/25/2015   . Decreased vision in both eyes 08/25/2015  . Prolonged Q-T interval on ECG 08/19/2015  . History of pulmonary embolism 08/10/2015  . Mild persistent asthma 04/13/2015  . Fibroid, uterine   . Morbid obesity ([place]) 11/11/2014  . Sinus tachycardia 11/08/2014  . Migraine 10/14/2014  . Menorrhagia 09/08/2014    Dorene Ar, PTA 05/24/2019, 10:05 AM  [facility] [address] [place], [place], [address] Phone: [phone]   Fax:  [phone]  Name: Melinda [name] MRN: [id number] Date of Birth: [birth year]

## 2019-05-25 ENCOUNTER — Other Ambulatory Visit: Payer: Self-pay | Admitting: Internal Medicine

## 2019-05-25 DIAGNOSIS — G8929 Other chronic pain: Secondary | ICD-10-CM

## 2019-05-25 DIAGNOSIS — M545 Low back pain, unspecified: Secondary | ICD-10-CM

## 2019-05-25 MED FILL — TOPIRAMATE 200 MG TABLET: 200 | 30 days supply | Qty: 30 | Fill #1

## 2019-05-26 MED FILL — METHOCARBAMOL 500 MG TABS: 500 | 15 days supply | Qty: 90 | Fill #0

## 2019-05-31 ENCOUNTER — Ambulatory Visit: Payer: Medicaid Other | Admitting: Physical Therapy

## 2019-05-31 ENCOUNTER — Other Ambulatory Visit: Payer: Self-pay

## 2019-05-31 ENCOUNTER — Encounter: Payer: Self-pay | Admitting: Physical Therapy

## 2019-05-31 DIAGNOSIS — G8929 Other chronic pain: Secondary | ICD-10-CM

## 2019-05-31 DIAGNOSIS — M6281 Muscle weakness (generalized): Secondary | ICD-10-CM | POA: Diagnosis not present

## 2019-05-31 DIAGNOSIS — R269 Unspecified abnormalities of gait and mobility: Secondary | ICD-10-CM

## 2019-05-31 DIAGNOSIS — M545 Low back pain: Secondary | ICD-10-CM | POA: Diagnosis not present

## 2019-05-31 DIAGNOSIS — M25511 Pain in right shoulder: Secondary | ICD-10-CM | POA: Diagnosis not present

## 2019-05-31 DIAGNOSIS — R262 Difficulty in walking, not elsewhere classified: Secondary | ICD-10-CM | POA: Diagnosis not present

## 2019-05-31 NOTE — Therapy (Signed)
[place], [place], [address] Phone: [phone]   Fax:  [phone]  Physical Therapy Treatment  Patient Details  Name: Melinda [name] MRN: [id number] Date of Birth: 15-Apr-1966 Referring Provider (PT): [name], MD (for back / abnromal gait),    Encounter Date: 05/31/2019  PT End of Session - 05/31/19 1125    Visit Number  4    Number of Visits  13    Date for PT Re-Evaluation  07/02/19    Authorization Type  MCD: resubmitted on 05/31/2019    Authorization - Visit Number  3    Authorization - Number of Visits  3    PT Start Time  1125    PT Stop Time  1210    PT Time Calculation (min)  45 min    Activity Tolerance  Patient tolerated treatment well;Patient limited by pain    Behavior During Therapy  [facility] for tasks assessed/performed       Past Medical History:  Diagnosis Date  . ADHD (attention deficit hyperactivity disorder)   . Arthritis   . Asthma   . Cancer ([place])   . Chicken pox AGE 23  . COPD (chronic obstructive pulmonary disease) ([place])    pt reported  . Diabetes mellitus without complication ([place])   . GERD (gastroesophageal reflux disease)   . Glaucoma    BOTH EYES, NO EYE DROPS  . Glaucoma   . History of blood transfusion MARCH 2016    2UNITS GIVEN AND IRON GIVEN  . Hyperglycemia 09/09/2014  . Hypertension   . 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 Iron deficiency anemia due to chronic blood loss 09/09/2014  . Low TSH level 09/08/2014  . Lumbar herniated disc   . Menorrhagia 09/08/2014  . Migraine    CLUSTER AND MIGRAINES  . Multiple thyroid nodules   . Ovarian mass, right 09/09/2014  . PE (pulmonary embolism) 2016  . Peripheral neuropathy    FINGER TIPS AND TOES NUMB SOME  . Preeclampsia  1983   WITH PREGNANCY  . PTSD (post-traumatic stress disorder)   . Scoliosis   . Seizures ([place]) AGE 89   NONE  SINCE, HAD CHICKEN POX THEN    Past Surgical History:  Procedure Laterality Date  . ANGIOGRAM TO LEG  08-13-15   RIGHT  . CHOLECYSTECTOMY    . IR GENERIC HISTORICAL  04/04/2016   IR RADIOLOGIST EVAL & MGMT 04/04/2016 [name], MD [facility] INTERV RAD  . THYROIDECTOMY N/A 11/17/2015   Procedure: TOTAL THYROIDECTOMY;  Surgeon: Armandina Gemma, MD;  Location: [facility];  Service: General;  Laterality: N/A;  . UTERINE ARTERY EMBOLIZATION Bilateral 08/13/2015    There were no vitals filed for this visit.  Subjective Assessment - 05/31/19 1126    Subjective  "I've been okay.  the shoulder still alittle sore, The exercises are coming along, I do them mostly everyday. The back is bothering me more in the back"    Patient Stated Goals  to have less pain, to be able use the R arm    Currently in Pain?  Yes    Pain Score  2     Pain Location  Shoulder    Pain Orientation  Right    Pain Descriptors / Indicators  Aching    Pain Onset  More than a month ago    Pain Frequency  Intermittent  Aggravating Factors   liflting and reaching out    Multiple Pain Sites  Yes    Pain Score  7    Pain Location  Back    Pain Orientation  Right;Left    Pain Descriptors / Indicators  Aching;Sore    Pain Type  Chronic pain    Pain Onset  More than a month ago    Pain Frequency  Intermittent    Aggravating Factors   standing/ walking, PMS    Pain Relieving Factors  meds         [facility] PT Assessment - 05/31/19 0001      Assessment   Medical Diagnosis  R shoulder pain, Chronic bilateral low back pain without sciatica M54.5, G89.29, Gait disturbance R26.9    Referring Provider (PT)  [name], MD (for back / abnromal gait),       AROM   Right Shoulder Flexion  80 Degrees    Right Shoulder ABduction  72 Degrees    Right Shoulder Internal Rotation  --   greater trochanter   Right Shoulder External Rotation  --   R parietal bone     Strength   Right Shoulder Flexion  3+/5    Right Shoulder  Extension  3+/5    Right Shoulder ABduction  3-/5    Right Shoulder Internal Rotation  3-/5    Right Shoulder External Rotation  3-/5    Left Shoulder Flexion  4/5    Left Shoulder Extension  4/5    Left Shoulder ABduction  4/5    Left Shoulder Internal Rotation  4/5    Left Shoulder External Rotation  4/5                   [facility] Adult PT Treatment/Exercise - 05/31/19 0001      Exercises   Exercises  Lumbar;Shoulder      Lumbar Exercises: Stretches   Active Hamstring Stretch  2 reps;30 seconds      Lumbar Exercises: Seated   Other Seated Lumbar Exercises  seated marching 2 x  15      Shoulder Exercises: Seated   Other Seated Exercises  AAROM wand ER 1 x 15, wand flexion/ abduction 1 x 15      Shoulder Exercises: Isometric Strengthening   Flexion  5X5"    External Rotation  5X5"    Internal Rotation  5X5"             PT Education - 05/31/19 1224    Education Details  reviewed HEP and updated today for shoulder isometrics, and hamstring stretching/ seated marching. discussed importance of using a RW vs rollator due to being higher fall risk    Person(s) Educated  Patient    Methods  Explanation;Verbal cues    Comprehension  Verbalized understanding;Verbal cues required       PT Short Term Goals - 05/31/19 1133      PT SHORT TERM GOAL #1   Title  independent with HEP    Period  Weeks    Status  Achieved      PT SHORT TERM GOAL #2   Title  pt to verbalize/ demo techniques to reduce R shoulder pain/ inflammation via RICE and HEP    Baseline  TENS, unit and exercises    Period  Weeks      PT SHORT TERM GOAL #3   Title  pt to demonstrate proper posture and gait mechanics with LRAD to reduce and prevent  low back/ shoulder pain    Time  3    Status  Achieved        PT Long Term Goals - 05/31/19 1136      PT LONG TERM GOAL #1   Title  incease R shoulder AROM flexion to >/= 120 and  abduction to >/= 110 degrees for functional mobility with </=  3/10 pain    Baseline  R shoulder flexion 80  abduction 72    Time  6    Period  Weeks    Status  On-going    Target Date  07/02/19      PT LONG TERM GOAL #2   Title  increase trunk mobility to [facility] with </=4/10 pain    Baseline  limited trunk mobility with pain at currenlty 7/10 at worst 10/10    Time  6    Period  Weeks    Status  On-going    Target Date  07/02/19      PT LONG TERM GOAL #3   Title  increase R shoulder strength to >/= 4/5 (in available ROM)    Baseline  flexion and extension 3+/5, IR/ER and abduction 3-/5    Status  On-going    Target Date  07/02/19      PT LONG TERM GOAL #4   Title  pt to be able to walk/ stand for >/= 15 min with LRAD for in home and community amb    Baseline  able to walk ~5-8 min with rollator    Period  Weeks    Status  On-going    Target Date  07/02/19      PT LONG TERM GOAL #5   Title  pt to be I with all HEP given as of last visit to maintain and progress current level of function    Baseline  independent with current HEP and progressing as able.    Time  6    Period  Weeks    Target Date  07/02/19      Additional Long Term Goals   Additional Long Term Goals  Yes      PT LONG TERM GOAL #6   Title  pt to increase BERG balance score to >/= 35/56 to demo improvement in balance and safety    Baseline  initial score 25/56    Status  New    Target Date  07/02/19            Plan - 05/31/19 1219    Clinical Impression Statement  pt reports increased pain inthe back today at 7/10 and notes the shoulder is doing better regarding pain at 2/10 today. She continues to functional shoulder PROM with limited AROM secondary to weakness and pain. Focused shoulder AAROM and isometrics in sitting to avoid low back issues. she met her STG's today and exhibits motivation to conintue with functional progression. pt would benefit from continued PT to work on shoulder ROM and strenght, reduce low back pain and maximize her function by addressing  the deficits listed.    PT Treatment/Interventions  ADLs/Self Care Home Management;Cryotherapy;Electrical Stimulation;Iontophoresis 95m/ml Dexamethasone;Moist Heat;Ultrasound;DME Instruction;Gait training;Stair training;Functional mobility training;Therapeutic activities;Therapeutic exercise;Balance training;Neuromuscular re-education;Patient/family education;Manual techniques;Passive range of motion;Dry needling;Taping    PT Next Visit Plan  update HEP PRN, continue to reinforce benefits of walker Asses trunk ROM , shoulder PROM -> AAROM, scapular setting, core activation, modalities PRN    PT Home Exercise Plan  [id number]: Shoulder table slides flexion/ abduction, scapular retraction,  LTR, ab set, seated hamstring stretching and seated marching, shoulder isometrics (seated)    Consulted and Agree with Plan of Care  Patient       Patient will benefit from skilled therapeutic intervention in order to improve the following deficits and impairments:  Abnormal gait, Decreased activity tolerance, Decreased balance, Decreased endurance, Decreased range of motion, Decreased safety awareness, Decreased strength, Difficulty walking, Increased fascial restricitons, Increased muscle spasms, Impaired flexibility, Postural dysfunction, Obesity, Pain, Improper body mechanics  Visit Diagnosis: Abnormality of gait  Chronic bilateral low back pain, unspecified whether sciatica present  Chronic right shoulder pain  Muscle weakness (generalized)     Problem List Patient Active Problem List   Diagnosis Date Noted  . Adrenal mass, right ([place]) 04/29/2019  . Chronic cough 03/12/2018  . Left shoulder pain 10/14/2016  . Hypertension 10/14/2016  . S/P thyroidectomy 06/27/2016  . Chronic bilateral low back pain without sciatica 04/22/2016  . Liver lesion 12/24/2015  . Papillary thyroid carcinoma ([place]) 12/21/2015  . Controlled type 2 diabetes mellitus with complication, with long-term current use of insulin  ([place]) 10/10/2015  . Vision loss 08/25/2015  . Decreased vision in both eyes 08/25/2015  . Prolonged Q-T interval on ECG 08/19/2015  . History of pulmonary embolism 08/10/2015  . Mild persistent asthma 04/13/2015  . Fibroid, uterine   . Morbid obesity ([place]) 11/11/2014  . Sinus tachycardia 11/08/2014  . Migraine 10/14/2014  . Menorrhagia 09/08/2014    Starr Lake PT, DPT, LAT, ATC  05/31/19  12:29 PM      [facility] [address] [place], [place], [address] Phone: [phone]   Fax:  [phone]  Name: Melinda [name] MRN: [id number] Date of Birth: 1966-06-26

## 2019-06-01 ENCOUNTER — Ambulatory Visit: Payer: Medicaid Other | Admitting: Internal Medicine

## 2019-06-01 DIAGNOSIS — E1165 Type 2 diabetes mellitus with hyperglycemia: Secondary | ICD-10-CM | POA: Diagnosis not present

## 2019-06-03 ENCOUNTER — Ambulatory Visit: Payer: Medicaid Other | Attending: Internal Medicine | Admitting: Internal Medicine

## 2019-06-03 ENCOUNTER — Encounter: Payer: Self-pay | Admitting: Internal Medicine

## 2019-06-03 ENCOUNTER — Other Ambulatory Visit (HOSPITAL_COMMUNITY)
Admission: RE | Admit: 2019-06-03 | Discharge: 2019-06-03 | Disposition: A | Discharge status: Home / Self Care | Payer: Medicaid Other | Source: Ambulatory Visit | Attending: Internal Medicine | Admitting: Internal Medicine

## 2019-06-03 ENCOUNTER — Other Ambulatory Visit: Payer: Self-pay

## 2019-06-03 VITALS — BP 127/85 | HR 66 | Temp 98.2°F | Resp 16 | Wt 322.8 lb

## 2019-06-03 DIAGNOSIS — J4540 Moderate persistent asthma, uncomplicated: Secondary | ICD-10-CM | POA: Insufficient documentation

## 2019-06-03 DIAGNOSIS — Z1211 Encounter for screening for malignant neoplasm of colon: Secondary | ICD-10-CM

## 2019-06-03 DIAGNOSIS — Z01419 Encounter for gynecological examination (general) (routine) without abnormal findings: Secondary | ICD-10-CM | POA: Diagnosis not present

## 2019-06-03 DIAGNOSIS — G8929 Other chronic pain: Secondary | ICD-10-CM | POA: Insufficient documentation

## 2019-06-03 DIAGNOSIS — Z7901 Long term (current) use of anticoagulants: Secondary | ICD-10-CM | POA: Diagnosis not present

## 2019-06-03 DIAGNOSIS — Z124 Encounter for screening for malignant neoplasm of cervix: Secondary | ICD-10-CM | POA: Insufficient documentation

## 2019-06-03 DIAGNOSIS — Z86718 Personal history of other venous thrombosis and embolism: Secondary | ICD-10-CM | POA: Insufficient documentation

## 2019-06-03 DIAGNOSIS — Z794 Long term (current) use of insulin: Secondary | ICD-10-CM | POA: Insufficient documentation

## 2019-06-03 DIAGNOSIS — Z8041 Family history of malignant neoplasm of ovary: Secondary | ICD-10-CM | POA: Diagnosis not present

## 2019-06-03 DIAGNOSIS — Z803 Family history of malignant neoplasm of breast: Secondary | ICD-10-CM | POA: Diagnosis not present

## 2019-06-03 DIAGNOSIS — Z6841 Body Mass Index (BMI) 40.0 and over, adult: Secondary | ICD-10-CM | POA: Diagnosis not present

## 2019-06-03 DIAGNOSIS — Z87891 Personal history of nicotine dependence: Secondary | ICD-10-CM | POA: Diagnosis not present

## 2019-06-03 DIAGNOSIS — Z86711 Personal history of pulmonary embolism: Secondary | ICD-10-CM | POA: Diagnosis not present

## 2019-06-03 DIAGNOSIS — Z7951 Long term (current) use of inhaled steroids: Secondary | ICD-10-CM | POA: Insufficient documentation

## 2019-06-03 DIAGNOSIS — M545 Low back pain: Secondary | ICD-10-CM | POA: Diagnosis not present

## 2019-06-03 DIAGNOSIS — G43909 Migraine, unspecified, not intractable, without status migrainosus: Secondary | ICD-10-CM | POA: Diagnosis not present

## 2019-06-03 DIAGNOSIS — Z79890 Hormone replacement therapy (postmenopausal): Secondary | ICD-10-CM | POA: Diagnosis not present

## 2019-06-03 DIAGNOSIS — F4310 Post-traumatic stress disorder, unspecified: Secondary | ICD-10-CM | POA: Diagnosis not present

## 2019-06-03 DIAGNOSIS — F419 Anxiety disorder, unspecified: Secondary | ICD-10-CM | POA: Insufficient documentation

## 2019-06-03 DIAGNOSIS — Z79899 Other long term (current) drug therapy: Secondary | ICD-10-CM | POA: Diagnosis not present

## 2019-06-03 DIAGNOSIS — Z85850 Personal history of malignant neoplasm of thyroid: Secondary | ICD-10-CM | POA: Insufficient documentation

## 2019-06-03 DIAGNOSIS — I10 Essential (primary) hypertension: Secondary | ICD-10-CM | POA: Diagnosis not present

## 2019-06-03 NOTE — Progress Notes (Signed)
Patient ID: Melinda [name], female    DOB: [birth year]  MRN: [id number]  CC: Gynecologic Exam   Subjective: Melinda [name] is a 53 y.o. female who presents for GYN exam Her concerns today include:  Pt with hxofDM(followed by Dr. Jana Hakim, moderate persistent asthma,migraines followed by neurology, uterine fibroids followed by GYN, papillary thyroid CA s/pthyroidectomy, history of DVT/PE onEliquislifetime,obesity, PTSD/anxiety.  Pt is G1P1 No abn pap in past No Vaginal dischg or itching.  No sexually active Menses are regular lasting 7 days.  Heavy bleeding for firs 3 days Fhx of ? Ovarian cancer in mother, maternal aunt with breast CA.  Had uncles who had cancer but no sure of type Recent MMG 03/2019 was okay   Patient Active Problem List   Diagnosis Date Noted   Adrenal mass, right ([place]) 04/29/2019   Chronic cough 03/12/2018   Left shoulder pain 10/14/2016   Hypertension 10/14/2016   S/P thyroidectomy 06/27/2016   Chronic bilateral low back pain without sciatica 04/22/2016   Liver lesion 12/24/2015   Papillary thyroid carcinoma ([place]) 12/21/2015   Controlled type 2 diabetes mellitus with complication, with long-term current use of insulin ([place]) 10/10/2015   Vision loss 08/25/2015   Decreased vision in both eyes 08/25/2015   Prolonged Q-T interval on ECG 08/19/2015   History of pulmonary embolism 08/10/2015   Mild persistent asthma 04/13/2015   Fibroid, uterine    Morbid obesity ([place]) 11/11/2014   Sinus tachycardia 11/08/2014   Migraine 10/14/2014   Menorrhagia 09/08/2014     Current Outpatient Medications on File Prior to Visit  Medication Sig Dispense Refill   ACCU-CHEK AVIVA PLUS test strip USE TO TEST BLOOD GLUCOSE FOUR TIMES DAILY BEFORE MEALS AND AT BEDTIME (Patient not taking: Reported on 05/07/2019) 100 each 12   ACCU-CHEK SOFTCLIX LANCETS lancets   3   albuterol (PROVENTIL) (2.5 MG/3ML) 0.083% nebulizer solution TAKE 3 MLS (2.5 MG  TOTAL) BY NEBULIZATION EVERY 6 HOURS AS NEEDED FORWHEEZING OR SHORTNESS OF BREATH (Patient taking differently: Take 2.5 mg by nebulization every 6 (six) hours as needed for wheezing or shortness of breath. ) 150 mL 1   atorvastatin (LIPITOR) 20 MG tablet TAKE 1 TABLET BY MOUTH ONCE A DAY 30 tablet 2   budesonide-formoterol (SYMBICORT) 160-4.5 MCG/ACT inhaler Inhale 2 puffs into the lungs 2 (two) times daily. 2 Inhaler 0   buPROPion (WELLBUTRIN XL) 300 MG 24 hr tablet Take 300 mg by mouth daily.   2   busPIRone (BUSPAR) 5 MG tablet Take 5 mg by mouth 3 (three) times daily.     citalopram (CELEXA) 40 MG tablet Take 40 mg by mouth daily.   2   doxepin (SINEQUAN) 10 MG capsule Take 10 mg by mouth.     ELIQUIS 5 MG TABS tablet TAKE 1 TABLET BY MOUTH 2 TIMES DAILY. 60 tablet 2   fluticasone (CUTIVATE) 0.05 % cream Apply topically 2 (two) times daily. 30 g 2   gabapentin (NEURONTIN) 100 MG capsule TAKE 1 CAPSULE (100 MG TOTAL) BY MOUTH 3 (THREE) TIMES DAILY. 90 capsule 5   hydrochlorothiazide (MICROZIDE) 12.5 MG capsule TAKE 1 CAPSULE BY MOUTH ONCE DAILY 30 capsule 2   loratadine (CLARITIN) 10 MG tablet Take 1 tablet (10 mg total) by mouth daily. 30 tablet 11   methocarbamol (ROBAXIN) 500 MG tablet TAKE 2 TABLETS BY MOUTH EVERY 8 HOURS AS NEEDED FOR MUSCLE SPASMS 90 tablet 0   metoprolol tartrate (LOPRESSOR) 50 MG tablet TAKE 1 TABLET BY MOUTH TWO  TIMES DAILY 60 tablet 2   montelukast (SINGULAIR) 10 MG tablet TAKE 1 TABLET BY MOUTH EVERY EVENING AT BEDTIME 30 tablet 2   PAZEO 0.7 % SOLN      prazosin (MINIPRESS) 1 MG capsule Take 1 mg by mouth at bedtime.     SYNTHROID 200 MCG tablet      topiramate (TOPAMAX) 200 MG tablet TAKE 1 TABLET BY MOUTH AT BEDTIME 30 tablet 3   traMADol (ULTRAM) 50 MG tablet TAKE 1 TABLET BY MOUTH DAILY AS NEEDED FOR MODERATE OR SEVERE PAIN. 30 tablet 1   No current facility-administered medications on file prior to visit.     Allergies  Allergen  Reactions   Ceftriaxone Anaphylaxis    ROCEPHIN   Penicillins Shortness Of Breath    Has patient had a PCN reaction causing immediate rash, facial/tongue/throat swelling, SOB or lightheadedness with hypotension: Yes Has patient had a PCN reaction causing severe rash involving mucus membranes or skin necrosis: No Has patient had a PCN reaction that required hospitalization No Has patient had a PCN reaction occurring within the last 10 years: No If all of the above answers are "NO", then may proceed with Cephalosporin use.    Shellfish Allergy Anaphylaxis   Flonase [Fluticasone Propionate]     Makes migraines worse   Gold-Containing Drug Products     HANDS ITCH   Nickel     HANDS SWELL   Citrus Rash    Social History   Socioeconomic History   Marital status: Single    Spouse name: Not on file   Number of children: 1   Years of education: 13   Highest education level: Some college, no degree  Occupational History   Occupation: disabled  Scientist, product/process development strain: Not on file   Food insecurity    Worry: Not on file    Inability: Not on file   Transportation needs    Medical: Not on file    Non-medical: Not on file  Tobacco Use   Smoking status: Former Smoker    Packs/day: 0.50    Years: 40.00    Pack years: 20.00    Types: Cigarettes    Quit date: 11/03/2014    Years since quitting: 4.5   Smokeless tobacco: Never Used  Substance and Sexual Activity   Alcohol use: No    Alcohol/week: 0.0 standard drinks   Drug use: No   Sexual activity: Never    Birth control/protection: None  Lifestyle   Physical activity    Days per week: Not on file    Minutes per session: Not on file   Stress: Not on file  Relationships   Social connections    Talks on phone: Not on file    Gets together: Not on file    Attends religious service: Not on file    Active member of club or organization: Not on file    Attends meetings of clubs or  organizations: Not on file    Relationship status: Not on file   Intimate partner violence    Fear of current or ex partner: Not on file    Emotionally abused: Not on file    Physically abused: Not on file    Forced sexual activity: Not on file  Other Topics Concern   Not on file  Social History Narrative   Patient is right-handed. She lives in a one level handicap accessible apartment. She avoids caffeine. She uses stationary stationary pedals to use  for exercise.    Family History  Problem Relation Age of Onset   Diabetes Mother    Breast cancer Mother    CAD Mother    Hypertension Mother    Alcohol abuse Father    Hypertension Father    Breast cancer Maternal Aunt    Breast cancer Maternal Aunt     Past Surgical History:  Procedure Laterality Date   ANGIOGRAM TO LEG  08-13-15   RIGHT   CHOLECYSTECTOMY     IR GENERIC HISTORICAL  04/04/2016   IR RADIOLOGIST EVAL & MGMT 04/04/2016 [name], MD [facility] INTERV RAD   THYROIDECTOMY N/A 11/17/2015   Procedure: TOTAL THYROIDECTOMY;  Surgeon: Armandina Gemma, MD;  Location: [facility];  Service: General;  Laterality: N/A;   UTERINE ARTERY EMBOLIZATION Bilateral 08/13/2015    ROS: Review of Systems Negative except as stated above  PHYSICAL EXAM: BP 127/85    Pulse 66    Temp 98.2 F (36.8 C) (Oral)    Resp 16    Wt (!) 322 lb 12.8 oz (146.4 kg)    SpO2 98%    BMI 47.67 kg/m   Physical Exam  General appearance - alert, well appearing, and in no distress Mental status - normal mood, behavior, speech, dress, motor activity, and thought processes Breasts -CMA [place] present breasts are pendulous but without palpable masses.  No nipple discharge Pelvic - normal external genitalia, vulva, vagina, cervix, uterus and adnexa.  Small amount of white discharge around the cervix   CMP Latest Ref Rng & Units 04/29/2019 08/28/2018 02/18/2018  Glucose 65 - 99 mg/dL 113(H) 112(H) 91  BUN 6 - 24 mg/dL 15 11 12   Creatinine 0.57 -  1.00 mg/dL 1.07(H) 0.78 0.93  Sodium 134 - 144 mmol/L 142 140 140  Potassium 3.5 - 5.2 mmol/L 3.7 4.2 3.4(L)  Chloride 96 - 106 mmol/L 107(H) 104 106  CO2 20 - 29 mmol/L 22 19(L) 24  Calcium 8.7 - 10.2 mg/dL 9.1 9.0 9.0  Total Protein 6.0 - 8.5 g/dL 7.1 6.4 -  Total Bilirubin 0.0 - 1.2 mg/dL 0.3 0.4 -  Alkaline Phos 39 - 117 IU/L 135(H) 100 -  AST 0 - 40 IU/L 11 22 -  ALT 0 - 32 IU/L 12 25 -   Lipid Panel     Component Value Date/Time   CHOL 156 04/29/2019 1106   TRIG 125 04/29/2019 1106   HDL 52 04/29/2019 1106   CHOLHDL 3.0 04/29/2019 1106   CHOLHDL 2.7 09/08/2014 0147   VLDL 18 09/08/2014 0147   LDLCALC 82 04/29/2019 1106    CBC    Component Value Date/Time   WBC 10.9 (H) 04/29/2019 1106   WBC 6.9 02/18/2018 2337   RBC 5.05 04/29/2019 1106   RBC 5.85 (H) 02/18/2018 2337   HGB 14.7 04/29/2019 1106   HCT 45.4 04/29/2019 1106   PLT 289 04/29/2019 1106   MCV 90 04/29/2019 1106   MCH 29.1 04/29/2019 1106   MCH 29.2 02/18/2018 2337   MCHC 32.4 04/29/2019 1106   MCHC 32.8 02/18/2018 2337   RDW 14.0 04/29/2019 1106   LYMPHSABS 1.9 02/18/2018 2337   MONOABS 0.6 02/18/2018 2337   EOSABS 0.1 02/18/2018 2337   BASOSABS 0.0 02/18/2018 2337    ASSESSMENT AND PLAN: 1. Pap smear for cervical cancer screening - Cytology - PAP([facility]) Patient declined STD screening.  2. Screening for colon cancer Discussed colon cancer screening.  Patient prefers to have colonoscopy. - Ambulatory referral to Gastroenterology  Patient was given the opportunity to ask questions.  Patient verbalized understanding of the plan and was able to repeat key elements of the plan.   No orders of the defined types were placed in this encounter.    Requested Prescriptions    No prescriptions requested or ordered in this encounter    No follow-ups on file.  [name], MD, FACP

## 2019-06-03 NOTE — Progress Notes (Signed)
Pt states she is having right leg pain

## 2019-06-06 LAB — CYTOLOGY - PAP
Comment: NEGATIVE
Diagnosis: NEGATIVE
High risk HPV: NEGATIVE

## 2019-06-08 ENCOUNTER — Other Ambulatory Visit: Payer: Self-pay

## 2019-06-08 ENCOUNTER — Encounter: Payer: Self-pay | Admitting: Physical Therapy

## 2019-06-08 ENCOUNTER — Ambulatory Visit: Payer: Medicaid Other | Attending: Internal Medicine | Admitting: Physical Therapy

## 2019-06-08 DIAGNOSIS — M6281 Muscle weakness (generalized): Secondary | ICD-10-CM | POA: Diagnosis not present

## 2019-06-08 DIAGNOSIS — R269 Unspecified abnormalities of gait and mobility: Secondary | ICD-10-CM | POA: Diagnosis not present

## 2019-06-08 DIAGNOSIS — R262 Difficulty in walking, not elsewhere classified: Secondary | ICD-10-CM | POA: Insufficient documentation

## 2019-06-08 DIAGNOSIS — M545 Low back pain: Secondary | ICD-10-CM | POA: Insufficient documentation

## 2019-06-08 DIAGNOSIS — G8929 Other chronic pain: Secondary | ICD-10-CM | POA: Diagnosis not present

## 2019-06-08 DIAGNOSIS — M25511 Pain in right shoulder: Secondary | ICD-10-CM | POA: Diagnosis not present

## 2019-06-08 NOTE — Therapy (Signed)
[place], [place], [address] Phone: [phone]   Fax:  [phone]  Physical Therapy Treatment  Patient Details  Name: Melinda [name] MRN: [id number] Date of Birth: September 01, 1965 Referring Provider (PT): [name], MD (for back / abnromal gait),    Encounter Date: 06/08/2019  PT End of Session - 06/08/19 1142    Visit Number  5    Number of Visits  13    Date for PT Re-Evaluation  07/02/19    Authorization Type  [id number]    Authorization Time Period  06/08/19-06/28/19    Authorization - Visit Number  1    Authorization - Number of Visits  6    PT Start Time  1100    PT Stop Time  1146    PT Time Calculation (min)  46 min       Past Medical History:  Diagnosis Date  . ADHD (attention deficit hyperactivity disorder)   . Arthritis   . Asthma   . Cancer ([place])   . Chicken pox AGE 89  . COPD (chronic obstructive pulmonary disease) ([place])    pt reported  . Diabetes mellitus without complication ([place])   . GERD (gastroesophageal reflux disease)   . Glaucoma    BOTH EYES, NO EYE DROPS  . Glaucoma   . History of blood transfusion MARCH 2016    2UNITS GIVEN AND IRON GIVEN  . Hyperglycemia 09/09/2014  . Hypertension   . 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 Iron deficiency anemia due to chronic blood loss 09/09/2014  . Low TSH level 09/08/2014  . Lumbar herniated disc   . Menorrhagia 09/08/2014  . Migraine    CLUSTER AND MIGRAINES  . Multiple thyroid nodules   . Ovarian mass, right 09/09/2014  . PE (pulmonary embolism) 2016  . Peripheral neuropathy    FINGER TIPS AND TOES NUMB SOME  . Preeclampsia  1983   WITH PREGNANCY  . PTSD (post-traumatic stress disorder)   . Scoliosis   . Seizures ([place]) AGE 89   NONE SINCE, HAD CHICKEN POX THEN    Past Surgical History:  Procedure Laterality Date  . ANGIOGRAM TO LEG  08-13-15    RIGHT  . CHOLECYSTECTOMY    . IR GENERIC HISTORICAL  04/04/2016   IR RADIOLOGIST EVAL & MGMT 04/04/2016 [name], MD [facility] INTERV RAD  . THYROIDECTOMY N/A 11/17/2015   Procedure: TOTAL THYROIDECTOMY;  Surgeon: Armandina Gemma, MD;  Location: [facility];  Service: General;  Laterality: N/A;  . UTERINE ARTERY EMBOLIZATION Bilateral 08/13/2015    There were no vitals filed for this visit.  Subjective Assessment - 06/08/19 1105    Subjective  My back is better than it was last time. Just a little shoulder pain right now.    Currently in Pain?  Yes    Pain Score  3     Pain Location  Shoulder    Pain Orientation  Lower    Pain Descriptors / Indicators  Aching    Aggravating Factors   lifting and reaching    Pain Relieving Factors  aovid painful movement    Multiple Pain Sites  Yes    Pain Score  4    Pain Location  Back    Pain Orientation  Lower    Pain Descriptors / Indicators  Aching;Sore    Aggravating Factors   standing, walking  Pain Relieving Factors  meds         [facility] PT Assessment - 06/08/19 0001      AROM   Right Shoulder Flexion  80 Degrees                   [facility] Adult PT Treatment/Exercise - 06/08/19 0001      Lumbar Exercises: Stretches   Lower Trunk Rotation  10 seconds    Lower Trunk Rotation Limitations  10 reps       Lumbar Exercises: Aerobic   Nustep  5 minutes L5 LE only       Lumbar Exercises: Seated   Other Seated Lumbar Exercises  seated marching 2 x  15      Lumbar Exercises: Supine   Pelvic Tilt  15 reps    Pelvic Tilt Limitations  cues for abdominal and gluteal activation, good form    Clam Limitations  cues for abdominal draw in    Bent Knee Raise  20 reps    Bent Knee Raise Limitations  cues for abdominal draw in    Bridge  10 reps    Bridge Limitations  cues for initial poelvic tilt      Shoulder Exercises: Supine   Other Supine Exercises  Right active chest press x 10     Other Supine Exercises  supine chest press , small  pullovers       Shoulder Exercises: Seated   Retraction  10 reps    Other Seated Exercises  Seated shoulder flexion AAROM-rolling rollator away and back      Shoulder Exercises: Sidelying   External Rotation  AROM;15 reps      Shoulder Exercises: Pulleys   Flexion  2 minutes      Shoulder Exercises: Isometric Strengthening   Flexion  5X5"    External Rotation  5X5"    Internal Rotation  5X5"               PT Short Term Goals - 05/31/19 1133      PT SHORT TERM GOAL #1   Title  independent with HEP    Period  Weeks    Status  Achieved      PT SHORT TERM GOAL #2   Title  pt to verbalize/ demo techniques to reduce R shoulder pain/ inflammation via RICE and HEP    Baseline  TENS, unit and exercises    Period  Weeks      PT SHORT TERM GOAL #3   Title  pt to demonstrate proper posture and gait mechanics with LRAD to reduce and prevent low back/ shoulder pain    Time  3    Status  Achieved        PT Long Term Goals - 05/31/19 1136      PT LONG TERM GOAL #1   Title  incease R shoulder AROM flexion to >/= 120 and  abduction to >/= 110 degrees for functional mobility with </= 3/10 pain    Baseline  R shoulder flexion 80  abduction 72    Time  6    Period  Weeks    Status  On-going    Target Date  07/02/19      PT LONG TERM GOAL #2   Title  increase trunk mobility to [facility] with </=4/10 pain    Baseline  limited trunk mobility with pain at currenlty 7/10 at worst 10/10    Time  6    Period  Weeks    Status  On-going    Target Date  07/02/19      PT LONG TERM GOAL #3   Title  increase R shoulder strength to >/= 4/5 (in available ROM)    Baseline  flexion and extension 3+/5, IR/ER and abduction 3-/5    Status  On-going    Target Date  07/02/19      PT LONG TERM GOAL #4   Title  pt to be able to walk/ stand for >/= 15 min with LRAD for in home and community amb    Baseline  able to walk ~5-8 min with rollator    Period  Weeks    Status  On-going    Target  Date  07/02/19      PT LONG TERM GOAL #5   Title  pt to be I with all HEP given as of last visit to maintain and progress current level of function    Baseline  independent with current HEP and progressing as able.    Time  6    Period  Weeks    Target Date  07/02/19      Additional Long Term Goals   Additional Long Term Goals  Yes      PT LONG TERM GOAL #6   Title  pt to increase BERG balance score to >/= 35/56 to demo improvement in balance and safety    Baseline  initial score 25/56    Status  New    Target Date  07/02/19            Plan - 06/08/19 1115    Clinical Impression Statement  Pt will have follow up for shoulder with MD tomorrow. She has limited tolerance to shoulder isometrics and AAROM. She does well with mat core strengthening. Increased pain with low leverl shoulder pulleys.    PT Next Visit Plan  update HEP PRN, continue to reinforce benefits of walker Asses trunk ROM , shoulder PROM -> AAROM, scapular setting, core activation, modalities PRN    PT Home Exercise Plan  [id number]: Shoulder table slides flexion/ abduction, scapular retraction, LTR, ab set, seated hamstring stretching and seated marching, shoulder isometrics (seated)       Patient will benefit from skilled therapeutic intervention in order to improve the following deficits and impairments:  Abnormal gait, Decreased activity tolerance, Decreased balance, Decreased endurance, Decreased range of motion, Decreased safety awareness, Decreased strength, Difficulty walking, Increased fascial restricitons, Increased muscle spasms, Impaired flexibility, Postural dysfunction, Obesity, Pain, Improper body mechanics  Visit Diagnosis: Abnormality of gait  Chronic bilateral low back pain, unspecified whether sciatica present  Chronic right shoulder pain  Muscle weakness (generalized)  Chronic bilateral low back pain without sciatica  Difficulty in walking, not elsewhere classified     Problem  List Patient Active Problem List   Diagnosis Date Noted  . Adrenal mass, right ([place]) 04/29/2019  . Chronic cough 03/12/2018  . Left shoulder pain 10/14/2016  . Hypertension 10/14/2016  . S/P thyroidectomy 06/27/2016  . Chronic bilateral low back pain without sciatica 04/22/2016  . Liver lesion 12/24/2015  . Papillary thyroid carcinoma ([place]) 12/21/2015  . Controlled type 2 diabetes mellitus with complication, with long-term current use of insulin ([place]) 10/10/2015  . Vision loss 08/25/2015  . Decreased vision in both eyes 08/25/2015  . Prolonged Q-T interval on ECG 08/19/2015  . History of pulmonary embolism 08/10/2015  . Mild persistent asthma 04/13/2015  . Fibroid, uterine   . Morbid obesity (  [place]) 11/11/2014  . Sinus tachycardia 11/08/2014  . Migraine 10/14/2014  . Menorrhagia 09/08/2014    Dorene Ar, PTA 06/08/2019, 11:47 AM  [facility] [address] [place], [place], [address] Phone: [phone]   Fax:  [phone]  Name: Melinda [name] MRN: [id number] Date of Birth: 02/04/1966

## 2019-06-09 ENCOUNTER — Ambulatory Visit: Payer: Medicaid Other | Admitting: Sports Medicine

## 2019-06-09 ENCOUNTER — Encounter: Payer: Self-pay | Admitting: Sports Medicine

## 2019-06-09 VITALS — BP 127/67 | Ht 69.0 in | Wt 317.0 lb

## 2019-06-09 DIAGNOSIS — M25511 Pain in right shoulder: Secondary | ICD-10-CM

## 2019-06-09 NOTE — Progress Notes (Addendum)
[facility] [address] [place], [place] [address] Phone: [phone] Fax: [phone]   Patient Name: Melinda [name] Date of Birth: Mar 16, 1966 Medical Record Number: [id number] Gender: female Date of Encounter: 06/09/2019  CC: Follow-up right shoulder pain  HPI: Pt is wearing up for right shoulder pain.  She has had some improvement with physical therapy.  Unfortunately she is still having limited range of motion reaching overhead and across her body.  She is using the Voltaren gel and TENS unit daily.  Does feel like therapy is helping, and would like to continue with formal PT.  She denies any radiating symptoms into her fingers.  She would like to hold off on any intra-articular or subacromial injection at this time.  Past Medical History:  Diagnosis Date  . ADHD (attention deficit hyperactivity disorder)   . Arthritis   . Asthma   . Cancer ([place])   . Chicken pox AGE 3  . COPD (chronic obstructive pulmonary disease) ([place])    pt reported  . Diabetes mellitus without complication ([place])   . GERD (gastroesophageal reflux disease)   . Glaucoma    BOTH EYES, NO EYE DROPS  . Glaucoma   . History of blood transfusion MARCH 2016    2UNITS GIVEN AND IRON GIVEN  . Hyperglycemia 09/09/2014  . Hypertension   . 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 Iron deficiency anemia due to chronic blood loss 09/09/2014  . Low TSH level 09/08/2014  . Lumbar herniated disc   . Menorrhagia 09/08/2014  . Migraine    CLUSTER AND MIGRAINES  . Multiple thyroid nodules   . Ovarian mass, right 09/09/2014  . PE (pulmonary embolism) 2016  . Peripheral neuropathy    FINGER TIPS AND TOES NUMB SOME  . Preeclampsia  1983   WITH PREGNANCY  . PTSD (post-traumatic stress disorder)   . Scoliosis   . Seizures ([name]) AGE 3   NONE SINCE, HAD CHICKEN POX THEN    Current Outpatient Medications on File  Prior to Visit  Medication Sig Dispense Refill  . ACCU-CHEK SOFTCLIX LANCETS lancets   3  . albuterol (PROVENTIL) (2.5 MG/3ML) 0.083% nebulizer solution TAKE 3 MLS (2.5 MG TOTAL) BY NEBULIZATION EVERY 6 HOURS AS NEEDED FORWHEEZING OR SHORTNESS OF BREATH (Patient taking differently: Take 2.5 mg by nebulization every 6 (six) hours as needed for wheezing or shortness of breath. ) 150 mL 1  . atorvastatin (LIPITOR) 20 MG tablet TAKE 1 TABLET BY MOUTH ONCE A DAY 30 tablet 2  . budesonide-formoterol (SYMBICORT) 160-4.5 MCG/ACT inhaler Inhale 2 puffs into the lungs 2 (two) times daily. 2 Inhaler 0  . buPROPion (WELLBUTRIN XL) 300 MG 24 hr tablet Take 300 mg by mouth daily.   2  . busPIRone (BUSPAR) 5 MG tablet Take 5 mg by mouth 3 (three) times daily.    . citalopram (CELEXA) 40 MG tablet Take 40 mg by mouth daily.   2  . doxepin (SINEQUAN) 10 MG capsule Take 10 mg by mouth.    Arne Cleveland 5 MG TABS tablet TAKE 1 TABLET BY MOUTH 2 TIMES DAILY. 60 tablet 2  . fluticasone (CUTIVATE) 0.05 % cream Apply topically 2 (two) times daily. 30 g 2  . gabapentin (NEURONTIN) 100 MG capsule TAKE 1 CAPSULE (100 MG TOTAL) BY MOUTH 3 (THREE) TIMES DAILY. 90 capsule 5  . hydrochlorothiazide (MICROZIDE) 12.5 MG capsule TAKE  1 CAPSULE BY MOUTH ONCE DAILY 30 capsule 2  . loratadine (CLARITIN) 10 MG tablet Take 1 tablet (10 mg total) by mouth daily. 30 tablet 11  . methocarbamol (ROBAXIN) 500 MG tablet TAKE 2 TABLETS BY MOUTH EVERY 8 HOURS AS NEEDED FOR MUSCLE SPASMS 90 tablet 0  . metoprolol tartrate (LOPRESSOR) 50 MG tablet TAKE 1 TABLET BY MOUTH TWO TIMES DAILY 60 tablet 2  . montelukast (SINGULAIR) 10 MG tablet TAKE 1 TABLET BY MOUTH EVERY EVENING AT BEDTIME 30 tablet 2  . PAZEO 0.7 % SOLN     . prazosin (MINIPRESS) 1 MG capsule Take 1 mg by mouth at bedtime.    Marland Kitchen SYNTHROID 200 MCG tablet     . topiramate (TOPAMAX) 200 MG tablet TAKE 1 TABLET BY MOUTH AT BEDTIME 30 tablet 3  . traMADol (ULTRAM) 50 MG tablet TAKE 1 TABLET  BY MOUTH DAILY AS NEEDED FOR MODERATE OR SEVERE PAIN. 30 tablet 1  . ACCU-CHEK AVIVA PLUS test strip USE TO TEST BLOOD GLUCOSE FOUR TIMES DAILY BEFORE MEALS AND AT BEDTIME (Patient not taking: Reported on 05/07/2019) 100 each 12   No current facility-administered medications on file prior to visit.     Past Surgical History:  Procedure Laterality Date  . ANGIOGRAM TO LEG  08-13-15   RIGHT  . CHOLECYSTECTOMY    . IR GENERIC HISTORICAL  04/04/2016   IR RADIOLOGIST EVAL & MGMT 04/04/2016 [name], MD [facility] INTERV RAD  . THYROIDECTOMY N/A 11/17/2015   Procedure: TOTAL THYROIDECTOMY;  Surgeon: Armandina Gemma, MD;  Location: [facility];  Service: General;  Laterality: N/A;  . UTERINE ARTERY EMBOLIZATION Bilateral 08/13/2015    Allergies  Allergen Reactions  . Ceftriaxone Anaphylaxis    ROCEPHIN  . Penicillins Shortness Of Breath    Has patient had a PCN reaction causing immediate rash, facial/tongue/throat swelling, SOB or lightheadedness with hypotension: Yes Has patient had a PCN reaction causing severe rash involving mucus membranes or skin necrosis: No Has patient had a PCN reaction that required hospitalization No Has patient had a PCN reaction occurring within the last 10 years: No If all of the above answers are "NO", then may proceed with Cephalosporin use.   . Shellfish Allergy Anaphylaxis  . Flonase [Fluticasone Propionate]     Makes migraines worse  . Gold-Containing Drug Products     HANDS ITCH  . Nickel     HANDS SWELL  . Citrus Rash    Social History   Socioeconomic History  . Marital status: Single    Spouse name: Not on file  . Number of children: 1  . Years of education: 60  . Highest education level: Some college, no degree  Occupational History  . Occupation: disabled  Social Needs  . Financial resource strain: Not on file  . Food insecurity    Worry: Not on file    Inability: Not on file  . Transportation needs    Medical: Not on file    Non-medical: Not  on file  Tobacco Use  . Smoking status: Former Smoker    Packs/day: 0.50    Years: 40.00    Pack years: 20.00    Types: Cigarettes    Quit date: 11/03/2014    Years since quitting: 4.6  . Smokeless tobacco: Never Used  Substance and Sexual Activity  . Alcohol use: No    Alcohol/week: 0.0 standard drinks  . Drug use: No  . Sexual activity: Never    Birth control/protection: None  Lifestyle  . Physical activity    Days per week: Not on file    Minutes per session: Not on file  . Stress: Not on file  Relationships  . Social Herbalist on phone: Not on file    Gets together: Not on file    Attends religious service: Not on file    Active member of club or organization: Not on file    Attends meetings of clubs or organizations: Not on file    Relationship status: Not on file  . Intimate partner violence    Fear of current or ex partner: Not on file    Emotionally abused: Not on file    Physically abused: Not on file    Forced sexual activity: Not on file  Other Topics Concern  . Not on file  Social History Narrative   Patient is right-handed. She lives in a one level handicap accessible apartment. She avoids caffeine. She uses stationary stationary pedals to use for exercise.    Family History  Problem Relation Age of Onset  . Diabetes Mother   . Breast cancer Mother   . CAD Mother   . Hypertension Mother   . Alcohol abuse Father   . Hypertension Father   . Breast cancer Maternal Aunt   . Breast cancer Maternal Aunt     BP 127/67   Ht 5\' 9"  (1.753 m)   Wt (!) 317 lb (143.8 kg)   BMI 46.81 kg/m   ROS:  See HPI CONST: no F/C, no malaise, no fatigue MSK: See above NEURO: no numbness/tingling, no weakness SKIN: no rash, no lesions HEME: no bleeding, no bruising, no erythema  Objective: GEN: Alert and oriented, NAD Pulm: Breathing unlabored PSY: normal mood, congruent affect  Right shoulder Obese, well nourished, in no acute distress. No  swelling, ecchymoses.  No gross deformity. Mild TTP at [facility] joint and lateral humerus. Active abduction to 100 degrees, active flexion to 110 degrees Abduction strength is 4/5, external rotation is 4/5 Negative Hawkins, Neers. Negative Yergasons. Negative apprehension. NV intact distally.  Assessment and Plan:  1.  Right shoulder pain  Patient would like to continue conservative measures and formal physical therapy before pursuing any kind of steroid injection.  We will follow-up with me after the new year at which time we can consider a CSI.   Lanier Clam, DO, ATC Sports Medicine Fellow  Addendum:  Patient seen in the office by fellow.  His history, exam, plan of care were precepted with me.  Karlton Lemon MD Kirt Boys

## 2019-06-10 ENCOUNTER — Encounter: Payer: Medicaid Other | Admitting: Physical Therapy

## 2019-06-10 MED FILL — PRAZOSIN 2 MG CAPSULE: 2 | 30 days supply | Qty: 30 | Fill #0

## 2019-06-10 MED FILL — BUPROPION HCL XL 300 MG TAB: 300 | 30 days supply | Qty: 30 | Fill #0

## 2019-06-10 MED FILL — CITALOPRAM HBR 40 MG TABLET: 40 | 30 days supply | Qty: 30 | Fill #0

## 2019-06-10 MED FILL — DOXEPIN HCL 25 MG CAPS: 25 | 30 days supply | Qty: 30 | Fill #0

## 2019-06-10 MED FILL — busPIRone HCL 5 MG TABS: 5 | 30 days supply | Qty: 90 | Fill #0

## 2019-06-15 ENCOUNTER — Ambulatory Visit: Payer: Medicaid Other | Admitting: Physical Therapy

## 2019-06-17 ENCOUNTER — Ambulatory Visit: Payer: Medicaid Other | Admitting: Physical Therapy

## 2019-06-22 ENCOUNTER — Ambulatory Visit: Payer: Medicaid Other | Admitting: Physical Therapy

## 2019-06-24 ENCOUNTER — Encounter: Payer: Medicaid Other | Admitting: Physical Therapy

## 2019-06-29 ENCOUNTER — Ambulatory Visit: Payer: Medicaid Other | Admitting: Physical Therapy

## 2019-06-29 ENCOUNTER — Other Ambulatory Visit: Payer: Self-pay

## 2019-06-29 ENCOUNTER — Encounter: Payer: Self-pay | Admitting: Physical Therapy

## 2019-06-29 DIAGNOSIS — M545 Low back pain, unspecified: Secondary | ICD-10-CM

## 2019-06-29 DIAGNOSIS — G8929 Other chronic pain: Secondary | ICD-10-CM | POA: Diagnosis not present

## 2019-06-29 DIAGNOSIS — M6281 Muscle weakness (generalized): Secondary | ICD-10-CM

## 2019-06-29 DIAGNOSIS — M25511 Pain in right shoulder: Secondary | ICD-10-CM | POA: Diagnosis not present

## 2019-06-29 DIAGNOSIS — R262 Difficulty in walking, not elsewhere classified: Secondary | ICD-10-CM | POA: Diagnosis not present

## 2019-06-29 DIAGNOSIS — R269 Unspecified abnormalities of gait and mobility: Secondary | ICD-10-CM

## 2019-06-29 NOTE — Therapy (Signed)
[place], [place], [address] Phone: [phone]   Fax:  [phone]  Physical Therapy Treatment / Re-certification  Patient Details  Name: Melinda [name] MRN: [id number] Date of Birth: Oct 29, 1965 Referring Provider (PT): [name], MD (for back / abnromal gait),    Encounter Date: 06/29/2019  PT End of Session - 06/29/19 1111    Visit Number  6    Number of Visits  13    Date for PT Re-Evaluation  08/17/19    Authorization Type  MCD: resubmitted on 06/29/2019    PT Start Time  1111   pt arrived 11 min late   PT Stop Time  1143    PT Time Calculation (min)  32 min    Activity Tolerance  Patient tolerated treatment well;Patient limited by pain    Behavior During Therapy  [facility] for tasks assessed/performed       Past Medical History:  Diagnosis Date  . ADHD (attention deficit hyperactivity disorder)   . Arthritis   . Asthma   . Cancer ([place])   . Chicken pox AGE 18  . COPD (chronic obstructive pulmonary disease) ([place])    pt reported  . Diabetes mellitus without complication ([place])   . GERD (gastroesophageal reflux disease)   . Glaucoma    BOTH EYES, NO EYE DROPS  . Glaucoma   . History of blood transfusion MARCH 2016    2UNITS GIVEN AND IRON GIVEN  . Hyperglycemia 09/09/2014  . Hypertension   . 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 Iron deficiency anemia due to chronic blood loss 09/09/2014  . Low TSH level 09/08/2014  . Lumbar herniated disc   . Menorrhagia 09/08/2014  . Migraine    CLUSTER AND MIGRAINES  . Multiple thyroid nodules   . Ovarian mass, right 09/09/2014  . PE (pulmonary embolism) 2016  . Peripheral neuropathy    FINGER TIPS AND TOES NUMB SOME  . Preeclampsia  1983   WITH PREGNANCY  . PTSD (post-traumatic stress disorder)   . Scoliosis   . Seizures ([place]) AGE 14   NONE SINCE, HAD CHICKEN POX THEN     Past Surgical History:  Procedure Laterality Date  . ANGIOGRAM TO LEG  08-13-15   RIGHT  . CHOLECYSTECTOMY    . IR GENERIC HISTORICAL  04/04/2016   IR RADIOLOGIST EVAL & MGMT 04/04/2016 [name], MD [facility] INTERV RAD  . THYROIDECTOMY N/A 11/17/2015   Procedure: TOTAL THYROIDECTOMY;  Surgeon: Armandina Gemma, MD;  Location: [facility];  Service: General;  Laterality: N/A;  . UTERINE ARTERY EMBOLIZATION Bilateral 08/13/2015    There were no vitals filed for this visit.  Subjective Assessment - 06/29/19 1113    Subjective  "The pain is better in both my back and my shoulder. While I was at home I did alot of icing and ti chi which as really helped. I did it while I was seated and used my stationary pedals and put it on my freezer and worked on my shoulders."    Currently in Pain?  Yes    Pain Score  2     Pain Location  Shoulder    Pain Orientation  Right    Pain Descriptors / Indicators  Aching    Pain Type  Chronic pain    Aggravating Factors   using it too much    Pain Relieving  Factors  massage, TENS, pin    Pain Score  6    Pain Location  Back    Pain Orientation  Lower    Pain Descriptors / Indicators  Aching;Sore    Pain Type  Chronic pain    Pain Onset  More than a month ago    Pain Frequency  Intermittent    Aggravating Factors   walking,    Pain Relieving Factors  heat, hot shower    Effect of Pain on Daily Activities  limited walking/ standing         [facility] PT Assessment - 06/29/19 0001      Assessment   Medical Diagnosis  R shoulder pain, Chronic bilateral low back pain without sciatica M54.5, G89.29, Gait disturbance R26.9    Referring Provider (PT)  [name], MD (for back / abnromal gait),       AROM   Overall AROM Comments  trunk ROM limited assessment due to feeling dizzy    AROM Assessment Site  Lumbar    Right Shoulder Extension  60 Degrees    Right Shoulder Flexion  126 Degrees    Right Shoulder ABduction  80 Degrees    Right Shoulder Internal  Rotation  --   T10   Right Shoulder External Rotation  --   C7     Strength   Right Shoulder Flexion  3+/5    Right Shoulder Extension  3+/5    Right Shoulder ABduction  3-/5    Right Shoulder Internal Rotation  3-/5      Berg Balance Test   Sit to Stand  Able to stand  independently using hands    Standing Unsupported  Able to stand 30 seconds unsupported    Sitting with Back Unsupported but Feet Supported on Floor or Stool  Able to sit safely and securely 2 minutes    Stand to Sit  Uses backs of legs against chair to control descent    Transfers  Able to transfer safely, definite need of hands    Standing Unsupported with Eyes Closed  Able to stand 10 seconds with supervision    Standing Unsupported with Feet Together  Needs help to attain position but able to stand for 30 seconds with feet together    From Standing, Reach Forward with Outstretched Arm  Can reach forward >5 cm safely (2")    From Standing Position, Pick up Object from Floor  Unable to try/needs assist to keep balance    From Standing Position, Turn to Look Behind Over each Shoulder  Turn sideways only but maintains balance    Turn 360 Degrees  Needs close supervision or verbal cueing    Standing Unsupported, Alternately Place Feet on Step/Stool  Needs assistance to keep from falling or unable to try    Standing Unsupported, One Foot in [place] to take small step independently and hold 30 seconds    Standing on One Leg  Unable to try or needs assist to prevent fall    Total Score  25                           PT Education - 06/29/19 1229    Education Details  reviewed HEP and discussed POC moving forward    Person(s) Educated  Patient    Methods  Explanation;Verbal cues    Comprehension  Verbalized understanding;Verbal cues required       PT Short  Term Goals - 06/29/19 1119      PT SHORT TERM GOAL #1   Title  independent with HEP    Period  Weeks    Status  Achieved      PT SHORT  TERM GOAL #2   Title  pt to verbalize/ demo techniques to reduce R shoulder pain/ inflammation via RICE and HEP    Period  Weeks    Status  Achieved      PT SHORT TERM GOAL #3   Title  pt to demonstrate proper posture and gait mechanics with LRAD to reduce and prevent low back/ shoulder pain    Period  Weeks    Status  Achieved        PT Long Term Goals - 06/29/19 1120      PT LONG TERM GOAL #1   Title  incease R shoulder AROM flexion to >/= 120 and  abduction to >/= 110 degrees for functional mobility with </= 3/10 pain    Baseline  R shoulder flexion 126, abduction 80 degrees pain inthe shoulder pain 4-5/10    Time  6    Period  Weeks    Status  On-going      PT LONG TERM GOAL #2   Title  increase trunk mobility to [facility] with </=4/10 pain    Baseline  pain at 6/10 in the back limited motion due to pt getting dizzy with motions    Time  6    Period  Weeks    Status  On-going    Target Date  08/17/19      PT LONG TERM GOAL #3   Title  increase R shoulder strength to >/= 4/5 (in available ROM)    Baseline  flexion and extension 3+/5, IR/ER and abduction 3-/5    Period  Weeks    Status  On-going      PT LONG TERM GOAL #4   Title  pt to be able to walk/ stand for >/= 15 min with LRAD for in home and community amb    Baseline  able to walk ~3-4 min with SPC    Time  6    Period  Weeks    Status  On-going    Target Date  08/17/19      PT LONG TERM GOAL #5   Title  pt to be I with all HEP given as of last visit to maintain and progress current level of function    Baseline  independent with current HEP and progressing as able.    Time  6    Period  Weeks    Status  On-going    Target Date  08/17/19      PT LONG TERM GOAL #6   Title  pt to increase BERG balance score to >/= 35/56 to demo improvement in balance and safety    Baseline  initial score 25/56    Period  Weeks    Status  On-going    Target Date  08/17/19            Plan - 06/29/19 1229    Clinical  Impression Statement  Mrs Eastburn returns to PT after having to cancel last few visits due to lack of transportation. She has made progress with shoulder ROM and limited trunk mobility secondary to feeling dizzy. She continues to demonstate weakness in the shoulder secondary to pain. She met all STG's and is progressing appropriately toward her LTG's and demonstated motivation  to improve function. She would benefit from physical therapy to decrease R shoulder and low back pain, improve gait efficency and endurance and maximize her function by addressing the deficitis listed.    Rehab Potential  Excellent    PT Frequency  2x / week    PT Duration  4 weeks   new year inital auth 1 x a week for 3 weeks.   PT Treatment/Interventions  ADLs/Self Care Home Management;Cryotherapy;Electrical Stimulation;Iontophoresis 68m/ml Dexamethasone;Moist Heat;Ultrasound;DME Instruction;Gait training;Stair training;Functional mobility training;Therapeutic activities;Therapeutic exercise;Balance training;Neuromuscular re-education;Patient/family education;Manual techniques;Passive range of motion;Dry needling;Taping    PT Next Visit Plan  update HEP PRN, continue to reinforce benefits of walker , shoulder AAROM, scapular setting, core activation, modalities PRN, gentel trunk ROM due to hx or dizziness with forward mobility, Tai Chi techniques    PT Home Exercise Plan  [id number]: Shoulder table slides flexion/ abduction, scapular retraction, LTR, ab set, seated hamstring stretching and seated marching, shoulder isometrics (seated)    Consulted and Agree with Plan of Care  Patient       Patient will benefit from skilled therapeutic intervention in order to improve the following deficits and impairments:  Abnormal gait, Decreased activity tolerance, Decreased balance, Decreased endurance, Decreased range of motion, Decreased safety awareness, Decreased strength, Difficulty walking, Increased fascial restricitons, Increased muscle  spasms, Impaired flexibility, Postural dysfunction, Obesity, Pain, Improper body mechanics  Visit Diagnosis: Abnormality of gait  Chronic bilateral low back pain, unspecified whether sciatica present  Chronic right shoulder pain  Muscle weakness (generalized)  Difficulty in walking, not elsewhere classified     Problem List Patient Active Problem List   Diagnosis Date Noted  . Adrenal mass, right ([place] 04/29/2019  . Chronic cough 03/12/2018  . Left shoulder pain 10/14/2016  . Hypertension 10/14/2016  . S/P thyroidectomy 06/27/2016  . Chronic bilateral low back pain without sciatica 04/22/2016  . Liver lesion 12/24/2015  . Papillary thyroid carcinoma ([place] 12/21/2015  . Controlled type 2 diabetes mellitus with complication, with long-term current use of insulin ([place] 10/10/2015  . Vision loss 08/25/2015  . Decreased vision in both eyes 08/25/2015  . Prolonged Q-T interval on ECG 08/19/2015  . History of pulmonary embolism 08/10/2015  . Mild persistent asthma 04/13/2015  . Fibroid, uterine   . Morbid obesity ([place] 11/11/2014  . Sinus tachycardia 11/08/2014  . Migraine 10/14/2014  . Menorrhagia 09/08/2014   KStarr LakePT, DPT, LAT, ATC  06/29/19  12:37 PM      CPeoria HeightsCLifescape[address] [address]Phone: [phone]  Fax:  [phone] Name: ADidi GanawayMRN: [id number] of Birth: 108-21-1967

## 2019-07-01 ENCOUNTER — Ambulatory Visit: Payer: Medicaid Other | Admitting: Physical Therapy

## 2019-07-04 DIAGNOSIS — E1165 Type 2 diabetes mellitus with hyperglycemia: Secondary | ICD-10-CM | POA: Diagnosis not present

## 2019-07-09 ENCOUNTER — Other Ambulatory Visit: Payer: Self-pay | Admitting: Internal Medicine

## 2019-07-09 DIAGNOSIS — G8929 Other chronic pain: Secondary | ICD-10-CM

## 2019-07-09 MED FILL — ELIQUIS 5 MG TABLET: 5 | 30 days supply | Qty: 60 | Fill #0

## 2019-07-09 MED FILL — BUPROPION HCL ER (XL) 300 M: 300 | 30 days supply | Qty: 30 | Fill #1

## 2019-07-09 MED FILL — METOPROLOL TARTRATE 50 MG T: 50 | 30 days supply | Qty: 60 | Fill #2

## 2019-07-09 MED FILL — ATORVASTATIN 20 MG TABLET: 20 | 30 days supply | Qty: 30 | Fill #1

## 2019-07-09 MED FILL — SYNTHROID 200 MCG TABLET: 200 | 30 days supply | Qty: 30 | Fill #1

## 2019-07-09 MED FILL — busPIRone HCL 5 MG TABS: 5 | 30 days supply | Qty: 90 | Fill #1

## 2019-07-09 MED FILL — METHOCARBAMOL 500 MG TABS: 500 | 15 days supply | Qty: 90 | Fill #0

## 2019-07-09 MED FILL — GABAPENTIN 100 MG CAPSULE: 100 | 30 days supply | Qty: 90 | Fill #5

## 2019-07-09 MED FILL — TOPIRAMATE 200 MG TABLET: 200 | 30 days supply | Qty: 30 | Fill #2

## 2019-07-09 MED FILL — PRAZOSIN 2 MG CAPSULE: 2 | 30 days supply | Qty: 30 | Fill #1

## 2019-07-09 MED FILL — HYDROCHLOROTHIAZIDE 12.5 MG: 12.5 | 30 days supply | Qty: 30 | Fill #1

## 2019-07-09 MED FILL — CITALOPRAM HBR 40 MG TABLET: 40 | 30 days supply | Qty: 30 | Fill #1

## 2019-07-09 MED FILL — DOXEPIN HCL 25 MG CAPS: 25 | 30 days supply | Qty: 30 | Fill #1

## 2019-07-14 ENCOUNTER — Encounter: Payer: Self-pay | Admitting: Physical Therapy

## 2019-07-14 ENCOUNTER — Other Ambulatory Visit: Payer: Self-pay

## 2019-07-14 ENCOUNTER — Ambulatory Visit: Payer: Medicaid Other | Attending: Internal Medicine | Admitting: Physical Therapy

## 2019-07-14 DIAGNOSIS — R262 Difficulty in walking, not elsewhere classified: Secondary | ICD-10-CM | POA: Diagnosis not present

## 2019-07-14 DIAGNOSIS — M545 Low back pain, unspecified: Secondary | ICD-10-CM

## 2019-07-14 DIAGNOSIS — R269 Unspecified abnormalities of gait and mobility: Secondary | ICD-10-CM | POA: Diagnosis not present

## 2019-07-14 DIAGNOSIS — M25511 Pain in right shoulder: Secondary | ICD-10-CM | POA: Diagnosis not present

## 2019-07-14 DIAGNOSIS — G8929 Other chronic pain: Secondary | ICD-10-CM | POA: Insufficient documentation

## 2019-07-14 DIAGNOSIS — M6281 Muscle weakness (generalized): Secondary | ICD-10-CM | POA: Insufficient documentation

## 2019-07-14 NOTE — Therapy (Signed)
[place], [place], [address] Phone: [phone]   Fax:  [phone]  Physical Therapy Treatment  Patient Details  Name: Melinda [name] MRN: [id number] Date of Birth: 12/11/1965 Referring Provider (PT): [name], MD (for back / abnromal gait),    Encounter Date: 07/14/2019  PT End of Session - 07/14/19 0722    Visit Number  7    Number of Visits  13    Date for PT Re-Evaluation  08/17/19    Authorization Type  MCD: resubmitted on 06/29/2019    Authorization Time Period  07/06/19-07/26/19    Authorization - Visit Number  1    Authorization - Number of Visits  3    PT Start Time  0715    PT Stop Time  2563    PT Time Calculation (min)  44 min       Past Medical History:  Diagnosis Date  . ADHD (attention deficit hyperactivity disorder)   . Arthritis   . Asthma   . Cancer ([place])   . Chicken pox AGE 54  . COPD (chronic obstructive pulmonary disease) ([place])    pt reported  . Diabetes mellitus without complication ([place])   . GERD (gastroesophageal reflux disease)   . Glaucoma    BOTH EYES, NO EYE DROPS  . Glaucoma   . History of blood transfusion MARCH 2016    2UNITS GIVEN AND IRON GIVEN  . Hyperglycemia 09/09/2014  . Hypertension   . 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 Iron deficiency anemia due to chronic blood loss 09/09/2014  . Low TSH level 09/08/2014  . Lumbar herniated disc   . Menorrhagia 09/08/2014  . Migraine    CLUSTER AND MIGRAINES  . Multiple thyroid nodules   . Ovarian mass, right 09/09/2014  . PE (pulmonary embolism) 2016  . Peripheral neuropathy    FINGER TIPS AND TOES NUMB SOME  . Preeclampsia  1983   WITH PREGNANCY  . PTSD (post-traumatic stress disorder)   . Scoliosis   . Seizures ([place]) AGE 89   NONE SINCE, HAD CHICKEN POX THEN    Past Surgical History:  Procedure Laterality Date  . ANGIOGRAM  TO LEG  08-13-15   RIGHT  . CHOLECYSTECTOMY    . IR GENERIC HISTORICAL  04/04/2016   IR RADIOLOGIST EVAL & MGMT 04/04/2016 [name], MD [facility] INTERV RAD  . THYROIDECTOMY N/A 11/17/2015   Procedure: TOTAL THYROIDECTOMY;  Surgeon: Armandina Gemma, MD;  Location: [facility];  Service: General;  Laterality: N/A;  . UTERINE ARTERY EMBOLIZATION Bilateral 08/13/2015    There were no vitals filed for this visit.  Subjective Assessment - 07/14/19 0719    Subjective  I am  not taiking any meds, do not have the finances yet to fill prescription.    Currently in Pain?  Yes    Pain Score  4     Pain Location  Shoulder    Pain Orientation  Right    Pain Descriptors / Indicators  Aching    Pain Type  Chronic pain    Aggravating Factors   using it , using it early oin the morning    Pain Relieving Factors  massage ,TENS    Pain Score  7    Pain Location  Back    Pain Orientation  Lower    Pain Descriptors / Indicators  Aching;Sore  Pain Type  Chronic pain    Pain Radiating Towards  right hip    Aggravating Factors   moving around period    Pain Relieving Factors  heat, hot shower                       [facility] Adult PT Treatment/Exercise - 07/14/19 0001      Lumbar Exercises: Stretches   Lower Trunk Rotation  10 seconds    Lower Trunk Rotation Limitations  10 reps     Other Lumbar Stretch Exercise  seated physioball roll outs       Lumbar Exercises: Aerobic   Nustep  7 minutes L4 LE only , UE last 2 minutes       Lumbar Exercises: Standing   Other Standing Lumbar Exercises  marching x 20 at rollator, increased LBP      Lumbar Exercises: Supine   Pelvic Tilt  15 reps    Pelvic Tilt Limitations  cues for abdominal and gluteal activation, good form    Clam  20 reps    Clam Limitations  cues for abdominal draw in    Bent Knee Raise  20 reps    Bent Knee Raise Limitations  cues for abdominal draw in    Bridge  10 reps    Bridge Limitations  cues for initial poelvic tilt     Straight Leg Raise  10 reps    Straight Leg Raises Limitations  cues for abdominals, more difficulty on left       Shoulder Exercises: Pulleys   Flexion  2 minutes      Shoulder Exercises: ROM/Strengthening   Other ROM/Strengthening Exercises  rockwood series seated yellow band x 10 each              PT Education - 07/14/19 0752    Education Details  HEP    Person(s) Educated  Patient    Methods  Explanation;Handout    Comprehension  Verbalized understanding       PT Short Term Goals - 06/29/19 1119      PT SHORT TERM GOAL #1   Title  independent with HEP    Period  Weeks    Status  Achieved      PT SHORT TERM GOAL #2   Title  pt to verbalize/ demo techniques to reduce R shoulder pain/ inflammation via RICE and HEP    Period  Weeks    Status  Achieved      PT SHORT TERM GOAL #3   Title  pt to demonstrate proper posture and gait mechanics with LRAD to reduce and prevent low back/ shoulder pain    Period  Weeks    Status  Achieved        PT Long Term Goals - 06/29/19 1120      PT LONG TERM GOAL #1   Title  incease R shoulder AROM flexion to >/= 120 and  abduction to >/= 110 degrees for functional mobility with </= 3/10 pain    Baseline  R shoulder flexion 126, abduction 80 degrees pain inthe shoulder pain 4-5/10    Time  6    Period  Weeks    Status  On-going      PT LONG TERM GOAL #2   Title  increase trunk mobility to [facility] with </=4/10 pain    Baseline  pain at 6/10 in the back limited motion due to pt getting dizzy with motions  Time  6    Period  Weeks    Status  On-going    Target Date  08/17/19      PT LONG TERM GOAL #3   Title  increase R shoulder strength to >/= 4/5 (in available ROM)    Baseline  flexion and extension 3+/5, IR/ER and abduction 3-/5    Period  Weeks    Status  On-going      PT LONG TERM GOAL #4   Title  pt to be able to walk/ stand for >/= 15 min with LRAD for in home and community amb    Baseline  able to walk ~3-4 min  with SPC    Time  6    Period  Weeks    Status  On-going    Target Date  08/17/19      PT LONG TERM GOAL #5   Title  pt to be I with all HEP given as of last visit to maintain and progress current level of function    Baseline  independent with current HEP and progressing as able.    Time  6    Period  Weeks    Status  On-going    Target Date  08/17/19      PT LONG TERM GOAL #6   Title  pt to increase BERG balance score to >/= 35/56 to demo improvement in balance and safety    Baseline  initial score 25/56    Period  Weeks    Status  On-going    Target Date  08/17/19            Plan - 07/14/19 0801    Clinical Impression Statement  Pt arrives reporting pain in shoulder due to having to dismantle rollator to fit in the car for this appointment. She is also out of meds due to finances and reports increased muscle spasms all over her body as a result. Able to progress shoulder HEP to genlt yellow band strengthening without compliants of pain. Reviewed supne core with intermittemet spasms and cramping in lumbar and LE.    PT Next Visit Plan  update HEP PRN, continue to reinforce benefits of walker , shoulder AAROM , review yellow band rockwood) , scapular setting, core activation, modalities PRN, gentel trunk ROM due to hx or dizziness with forward mobility, Tai Chi techniques    PT Home Exercise Plan  [id number]: Shoulder table slides flexion/ abduction, scapular retraction, LTR, ab set, seated hamstring stretching and seated marching, shoulder isometrics (seated), rockwood yellow band       Patient will benefit from skilled therapeutic intervention in order to improve the following deficits and impairments:  Abnormal gait, Decreased activity tolerance, Decreased balance, Decreased endurance, Decreased range of motion, Decreased safety awareness, Decreased strength, Difficulty walking, Increased fascial restricitons, Increased muscle spasms, Impaired flexibility, Postural dysfunction,  Obesity, Pain, Improper body mechanics  Visit Diagnosis: Abnormality of gait  Chronic bilateral low back pain, unspecified whether sciatica present  Chronic right shoulder pain  Muscle weakness (generalized)  Difficulty in walking, not elsewhere classified  Chronic bilateral low back pain without sciatica     Problem List Patient Active Problem List   Diagnosis Date Noted  . Adrenal mass, right ([place]) 04/29/2019  . Chronic cough 03/12/2018  . Left shoulder pain 10/14/2016  . Hypertension 10/14/2016  . S/P thyroidectomy 06/27/2016  . Chronic bilateral low back pain without sciatica 04/22/2016  . Liver lesion 12/24/2015  . Papillary thyroid carcinoma ([place]) 12/21/2015  .  Controlled type 2 diabetes mellitus with complication, with long-term current use of insulin ([place]) 10/10/2015  . Vision loss 08/25/2015  . Decreased vision in both eyes 08/25/2015  . Prolonged Q-T interval on ECG 08/19/2015  . History of pulmonary embolism 08/10/2015  . Mild persistent asthma 04/13/2015  . Fibroid, uterine   . Morbid obesity ([place]) 11/11/2014  . Sinus tachycardia 11/08/2014  . Migraine 10/14/2014  . Menorrhagia 09/08/2014    Dorene Ar, PTA 07/14/2019, 8:04 AM  [facility] [address] [place], [place], [address] Phone: [phone]   Fax:  [phone]  Name: Melinda [name] MRN: [id number] Date of Birth: Sep 06, 1965

## 2019-07-20 ENCOUNTER — Other Ambulatory Visit: Payer: Self-pay

## 2019-07-20 ENCOUNTER — Encounter: Payer: Self-pay | Admitting: Physical Therapy

## 2019-07-20 ENCOUNTER — Ambulatory Visit: Payer: Medicaid Other | Admitting: Physical Therapy

## 2019-07-20 DIAGNOSIS — M6281 Muscle weakness (generalized): Secondary | ICD-10-CM

## 2019-07-20 DIAGNOSIS — M25511 Pain in right shoulder: Secondary | ICD-10-CM | POA: Diagnosis not present

## 2019-07-20 DIAGNOSIS — G8929 Other chronic pain: Secondary | ICD-10-CM

## 2019-07-20 DIAGNOSIS — R269 Unspecified abnormalities of gait and mobility: Secondary | ICD-10-CM

## 2019-07-20 DIAGNOSIS — R262 Difficulty in walking, not elsewhere classified: Secondary | ICD-10-CM | POA: Diagnosis not present

## 2019-07-20 DIAGNOSIS — M545 Low back pain: Secondary | ICD-10-CM | POA: Diagnosis not present

## 2019-07-20 NOTE — Therapy (Addendum)
[place], [place], [address] Phone: [phone]   Fax:  [phone]  Physical Therapy Treatment  Patient Details  Name: Melinda [name] MRN: [id number] Date of Birth: [birth year] Referring Provider (PT): [name], MD (for back / abnromal gait),    Encounter Date: 07/20/2019  PT End of Session - 07/20/19 1130    Visit Number  8    Number of Visits  13    Date for PT Re-Evaluation  08/17/19    Authorization Type  MCD: resubmitted on 06/29/2019    Authorization Time Period  07/06/19-07/26/19    Authorization - Visit Number  2    Authorization - Number of Visits  3    PT Start Time  8250    PT Stop Time  1208    PT Time Calculation (min)  43 min       Past Medical History:  Diagnosis Date  . ADHD (attention deficit hyperactivity disorder)   . Arthritis   . Asthma   . Cancer ([place])   . Chicken pox AGE 49  . COPD (chronic obstructive pulmonary disease) ([place])    pt reported  . Diabetes mellitus without complication ([place])   . GERD (gastroesophageal reflux disease)   . Glaucoma    BOTH EYES, NO EYE DROPS  . Glaucoma   . History of blood transfusion MARCH 2016    2UNITS GIVEN AND IRON GIVEN  . Hyperglycemia 09/09/2014  . Hypertension   . 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 Iron deficiency anemia due to chronic blood loss 09/09/2014  . Low TSH level 09/08/2014  . Lumbar herniated disc   . Menorrhagia 09/08/2014  . Migraine    CLUSTER AND MIGRAINES  . Multiple thyroid nodules   . Ovarian mass, right 09/09/2014  . PE (pulmonary embolism) 2016  . Peripheral neuropathy    FINGER TIPS AND TOES NUMB SOME  . Preeclampsia  1983   WITH PREGNANCY  . PTSD (post-traumatic stress disorder)   . Scoliosis   . Seizures ([place]) AGE 73   NONE SINCE, HAD CHICKEN POX THEN    Past Surgical History:  Procedure Laterality Date  . ANGIOGRAM  TO LEG  08-13-15   RIGHT  . CHOLECYSTECTOMY    . IR GENERIC HISTORICAL  04/04/2016   IR RADIOLOGIST EVAL & MGMT 04/04/2016 [name], MD [facility] INTERV RAD  . THYROIDECTOMY N/A 11/17/2015   Procedure: TOTAL THYROIDECTOMY;  Surgeon: Armandina Gemma, MD;  Location[facility];  Service: General;  Laterality: N/A;  . UTERINE ARTERY EMBOLIZATION Bilateral 08/13/2015    There were no vitals filed for this visit.  Subjective Assessment - 07/20/19 1127    Subjective  I can scratch my back now. I am able to comb my hair using that arm.    Currently in Pain?  No/denies         [facility] PT Assessment - 07/20/19 0001      AROM   Right Shoulder Flexion  140 Degrees    Right Shoulder ABduction  120 Degrees    Right Shoulder Internal Rotation  --   reach mid lumbar    Right Shoulder External Rotation  --   Reach T2                   [facility] Adult PT Treatment/Exercise - 07/20/19 0001      Lumbar  Exercises: Aerobic   Nustep  5 mim L4 LE only       Lumbar Exercises: Seated   Sit to Stand  5 reps   without UE     Lumbar Exercises: Supine   Pelvic Tilt  15 reps    Clam  20 reps    Clam Limitations  cues for abdominal draw in    Bent Knee Raise  20 reps    Bridge  10 reps    Straight Leg Raise  10 reps      Shoulder Exercises: Seated   Other Seated Exercises  Seated shoulder flexion AAROM-rolling rollator away and back   modified childs pose for lumbar stretch    Other Seated Exercises  seated dowel pullovers  x 8, seated shoulder abduction x 10       Shoulder Exercises: ROM/Strengthening   Other ROM/Strengthening Exercises  rockwood series seated red band x 12 each holding rollator with one UE     Other ROM/Strengthening Exercises  standing row red band rollator in front of patient          Balance Exercises - 07/20/19 1205      Balance Exercises: Standing   Other Standing Exercises Comments  staggered standing trials           PT Short Term Goals - 06/29/19 1119       PT SHORT TERM GOAL #1   Title  independent with HEP    Period  Weeks    Status  Achieved      PT SHORT TERM GOAL #2   Title  pt to verbalize/ demo techniques to reduce R shoulder pain/ inflammation via RICE and HEP    Period  Weeks    Status  Achieved      PT SHORT TERM GOAL #3   Title  pt to demonstrate proper posture and gait mechanics with LRAD to reduce and prevent low back/ shoulder pain    Period  Weeks    Status  Achieved        PT Long Term Goals - 07/20/19 1215      PT LONG TERM GOAL #1   Title  incease R shoulder AROM flexion to >/= 120 and  abduction to >/= 110 degrees for functional mobility with </= 3/10 pain    Time  6    Period  Weeks    Status  Achieved      PT LONG TERM GOAL #2   Title  increase trunk mobility to [facility] with </=4/10 pain    Time  6    Period  Weeks    Status  On-going      PT LONG TERM GOAL #3   Title  increase R shoulder strength to >/= 4/5 (in available ROM)    Time  6    Period  Weeks    Status  Unable to assess      PT LONG TERM GOAL #4   Title  pt to be able to walk/ stand for >/= 15 min with LRAD for in home and community amb    Baseline  10 minutes max at this time    Time  6    Period  Weeks    Status  On-going      PT LONG TERM GOAL #5   Title  pt to be I with all HEP given as of last visit to maintain and progress current level of function    Baseline  independent  with current HEP and progressing as able.    Time  6    Period  Weeks    Status  On-going      PT LONG TERM GOAL #6   Title  pt to increase BERG balance score to >/= 35/56 to demo improvement in balance and safety    Baseline  initial score 25/56    Period  Weeks    Status  Unable to assess            Plan - 07/20/19 1201    Clinical Impression Statement  Pt reports improvement in use of right arm for reaching behind back and head. She demonstrates improved AROM in all planes. Able to advance with strengthening. Given red band for HEP. She reports  Lumbar pain is improved as well. She reports less intensity of pain and less intense muscle spasms. She is doing Tai Chi in her [employer] where she can hold on to both counter for 10 minutes at a time. improved tolerance to closed chain exercises today. Will need to sumbit for additional insurance visits on next appointment.    Comorbidities  Hx of Cx,seizures, DM, incomplete spinal cord lesion t7-t12    PT Next Visit Plan  request additional medicaid visits. update HEP PRN, continue to reinforce benefits of walker , shoulder AAROM , review yellow band rock[place]) , scapular setting, core activation, modalities PRN, gentel trunk ROM due to hx or dizziness with forward mobility, Tai Chi techniques    PT Home Exercise Plan  [id number]: Shoulder table slides flexion/ abduction, scapular retraction, LTR, ab set, seated hamstring stretching and seated marching, shoulder isometrics (seated), rockwood yellow band (given red)       Patient will benefit from skilled therapeutic intervention in order to improve the following deficits and impairments:  Abnormal gait, Decreased activity tolerance, Decreased balance, Decreased endurance, Decreased range of motion, Decreased safety awareness, Decreased strength, Difficulty walking, Increased fascial restricitons, Increased muscle spasms, Impaired flexibility, Postural dysfunction, Obesity, Pain, Improper body mechanics  Visit Diagnosis: Abnormality of gait  Chronic bilateral low back pain, unspecified whether sciatica present  Chronic right shoulder pain  Muscle weakness (generalized)  Difficulty in walking, not elsewhere classified  Chronic bilateral low back pain without sciatica     Problem List Patient Active Problem List   Diagnosis Date Noted  . Adrenal mass, right ([place]) 04/29/2019  . Chronic cough 03/12/2018  . Left shoulder pain 10/14/2016  . Hypertension 10/14/2016  . S/P thyroidectomy 06/27/2016  . Chronic bilateral low back pain  without sciatica 04/22/2016  . Liver lesion 12/24/2015  . Papillary thyroid carcinoma ([place]) 12/21/2015  . Controlled type 2 diabetes mellitus with complication, with long-term current use of insulin ([place]) 10/10/2015  . Vision loss 08/25/2015  . Decreased vision in both eyes 08/25/2015  . Prolonged Q-T interval on ECG 08/19/2015  . History of pulmonary embolism 08/10/2015  . Mild persistent asthma 04/13/2015  . Fibroid, uterine   . Morbid obesity ([place]) 11/11/2014  . Sinus tachycardia 11/08/2014  . Migraine 10/14/2014  . Menorrhagia 09/08/2014    Dorene Ar, PTA 07/20/2019, 12:19 PM  [place], [place], [address] Phone: [phone]   Fax:  [phone]  Name: Melinda [name] MRN: [id number] Date of Birth: 1965/07/15

## 2019-07-21 ENCOUNTER — Ambulatory Visit: Payer: Medicaid Other | Admitting: Sports Medicine

## 2019-07-21 ENCOUNTER — Encounter: Payer: Self-pay | Admitting: Sports Medicine

## 2019-07-21 VITALS — BP 123/68 | Ht 69.75 in | Wt 312.0 lb

## 2019-07-21 DIAGNOSIS — M545 Low back pain: Secondary | ICD-10-CM | POA: Diagnosis not present

## 2019-07-21 DIAGNOSIS — M25511 Pain in right shoulder: Secondary | ICD-10-CM | POA: Diagnosis not present

## 2019-07-21 DIAGNOSIS — M65321 Trigger finger, right index finger: Secondary | ICD-10-CM

## 2019-07-21 DIAGNOSIS — G8929 Other chronic pain: Secondary | ICD-10-CM | POA: Diagnosis not present

## 2019-07-21 NOTE — Progress Notes (Addendum)
[place] [address] [place], [place] [address] Phone: [phone] Fax: [phone]   Patient Name: Melinda [name] Date of Birth: 07/28/1965 Medical Record Number: [id number] Gender: female Date of Encounter: 07/21/2019  SUBJECTIVE:      Chief Complaint:  Shoulder pain follow-up, low back pain, right trigger finger   HPI:  Melinda [name] is following up for right shoulder pain.  She feels she is doing a lot better since starting physical therapy.  She is able to perform her normal activities of daily living.  Feels the pain has gotten better and does not want an injection.  She denies any numbness or tingling in her fingers.  No new injury. Melinda [name] is also complaining of acute on chronic low back pain with intermittent radiating pain down her right leg.  She has suffered with this for some time.  She is not sure what exacerbated at this time.  She has been using a muscle relaxer that has helped. Melinda [name] is also complaining of triggering of her right finger.  She is able to fully extend it in the morning, but has noticed it to become more difficult.  Melinda [name] has never had an injection in this before.  She is able to use her hand.   ROS:     See HPI.   PERTINENT  PMH / PSH / FH / SH:  Past Medical, Surgical, Social, and Family History Reviewed & Updated in the EMR. Pertinent findings include:  Chronic low back pain, diabetes   OBJECTIVE:  BP 123/68   Ht 5' 9.75" (1.772 m)   Wt (!) 312 lb (141.5 kg)   BMI 45.09 kg/m  Physical Exam:  Vital signs are reviewed.   GEN: Alert and oriented, NAD, obese Pulm: Breathing unlabored PSY: normal mood, congruent affect  MSK: Right shoulder Well developed, well nourished, in no acute distress. No swelling, ecchymoses.  No gross deformity. No TTP. FROM. Strength 4/5 with empty can and resisted internal/external rotation. Positive Neers. Negative Yergasons. Negative apprehension. NV intact distally.  Back Exam:    Inspection: Unremarkable  Decreased active ROM SLR laying: Negative  XSLR laying: Negative  Palpable tenderness: None. FABER: negative. Sensory change: Gross sensation intact to all lumbar and sacral dermatomes.  Reflexes: 2+ at both patellar tendons, 2+ at achilles tendons, Babinski's downgoing.  Strength at Feet  Plantar-flexion: 4/5 Dorsi-flexion: 4/5 Eversion: 5/5 Inversion: 5/5  Leg strength  Quad: 5/5 Hamstring: 5/5 Hip flexor: 5/5 Hip abductors: 5/5  Gait unremarkable.  Mild triggering of right index finger with no appreciable nodule palpated at first MCP joint  ASSESSMENT & PLAN:   1. Right shoulder pain  She is improving with home exercises.  Reemphasized the importance of continuing with this therapy.  I advised her that if anytime in the future she would like to consider a steroid injection, we can accommodate that.  2.  Acute on chronic low back pain  Patient can continue to use muscle relaxer.  I advised her on the importance of core strengthening and weight loss reduction.  She states the physical therapist is planning to help her ability to rehabilitate the low back.  She will follow-up with me in 1 month.  3.  Right trigger finger  Instructed her on appropriate Band-Aid technique to sleep and at night.  If continues to bother her, we can consider an injection at her next visit.   Lanier Clam, DO, ATC Sports Medicine Fellow  Addendum:  I was the preceptor for this  visit and available for immediate consultation.  Karlton Lemon MD [facility] Boys

## 2019-07-21 NOTE — Patient Instructions (Signed)
Your shoulder is doing better, please keep up with the exercises at least 3 times a week.  If it does not get to a point where you are happy with, we can always do an injection. Regards to your finger, use a Band-Aid on your first and second finger before you go to bed at night to help decrease the triggering.  Remember we can always do an injection in your finger. Please continue to work with physical therapy for your low back for the next month.  Use the muscle relaxer at night.  Follow-up with me in 1 month.

## 2019-07-26 ENCOUNTER — Encounter: Payer: Self-pay | Admitting: Physical Therapy

## 2019-07-26 ENCOUNTER — Ambulatory Visit: Payer: Medicaid Other | Admitting: Physical Therapy

## 2019-07-26 ENCOUNTER — Other Ambulatory Visit: Payer: Self-pay

## 2019-07-26 DIAGNOSIS — G8929 Other chronic pain: Secondary | ICD-10-CM | POA: Diagnosis not present

## 2019-07-26 DIAGNOSIS — M6281 Muscle weakness (generalized): Secondary | ICD-10-CM | POA: Diagnosis not present

## 2019-07-26 DIAGNOSIS — M25511 Pain in right shoulder: Secondary | ICD-10-CM | POA: Diagnosis not present

## 2019-07-26 DIAGNOSIS — R262 Difficulty in walking, not elsewhere classified: Secondary | ICD-10-CM | POA: Diagnosis not present

## 2019-07-26 DIAGNOSIS — M545 Low back pain, unspecified: Secondary | ICD-10-CM

## 2019-07-26 DIAGNOSIS — R269 Unspecified abnormalities of gait and mobility: Secondary | ICD-10-CM

## 2019-07-26 NOTE — Therapy (Signed)
[place], [place], [address] Phone: [phone]   Fax:  [phone]  Physical Therapy Treatment/Re-cert   Patient Details  Name: Melinda [name] MRN: [id number] Date of Birth: 15-Jul-1965 Referring Provider (PT): [name], MD (for back / abnromal gait),    Encounter Date: 07/26/2019  PT End of Session - 07/26/19 1113    Visit Number  9    Number of Visits  13    Date for PT Re-Evaluation  09/06/19    Authorization Type  MCD: resubmitted on 06/29/2019    Authorization Time Period  07/06/19-07/26/19    Authorization - Visit Number  2    Authorization - Number of Visits  3    PT Start Time  1015    PT Stop Time  1056    PT Time Calculation (min)  41 min    Activity Tolerance  Patient tolerated treatment well;Patient limited by pain    Behavior During Therapy  [facility] for tasks assessed/performed       Past Medical History:  Diagnosis Date  . ADHD (attention deficit hyperactivity disorder)   . Arthritis   . Asthma   . Cancer ([place])   . Chicken pox AGE 33  . COPD (chronic obstructive pulmonary disease) ([place])    pt reported  . Diabetes mellitus without complication ([place])   . GERD (gastroesophageal reflux disease)   . Glaucoma    BOTH EYES, NO EYE DROPS  . Glaucoma   . History of blood transfusion MARCH 2016    2UNITS GIVEN AND IRON GIVEN  . Hyperglycemia 09/09/2014  . Hypertension   . 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 Iron deficiency anemia due to chronic blood loss 09/09/2014  . Low TSH level 09/08/2014  . Lumbar herniated disc   . Menorrhagia 09/08/2014  . Migraine    CLUSTER AND MIGRAINES  . Multiple thyroid nodules   . Ovarian mass, right 09/09/2014  . PE (pulmonary embolism) 2016  . Peripheral neuropathy    FINGER TIPS AND TOES NUMB SOME  . Preeclampsia  1983   WITH PREGNANCY  . PTSD (post-traumatic stress  disorder)   . Scoliosis   . Seizures ([place]) AGE 54   NONE SINCE, HAD CHICKEN POX THEN    Past Surgical History:  Procedure Laterality Date  . ANGIOGRAM TO LEG  08-13-15   RIGHT  . CHOLECYSTECTOMY    . IR GENERIC HISTORICAL  04/04/2016   IR RADIOLOGIST EVAL & MGMT 04/04/2016 [name], MD [facility] INTERV RAD  . THYROIDECTOMY N/A 11/17/2015   Procedure: TOTAL THYROIDECTOMY;  Surgeon: Armandina Gemma, MD;  Location: [facility];  Service: General;  Laterality: N/A;  . UTERINE ARTERY EMBOLIZATION Bilateral 08/13/2015    There were no vitals filed for this visit.  Subjective Assessment - 07/26/19 1020    Subjective  Patient reports her back and hip are feeling a little better.    Limitations  Lifting;Walking    How long can you stand comfortably?  5-8 min    How long can you walk comfortably?  5-8 min    Diagnostic tests  [place] on R shoulder    Patient Stated Goals  to have less pain, to be able use the R arm    Currently in Pain?  Yes    Pain Score  3     Pain Location  Shoulder  Pain Orientation  Right    Pain Descriptors / Indicators  Aching    Pain Type  Chronic pain    Pain Onset  More than a month ago    Pain Frequency  Intermittent    Aggravating Factors   using the shoulder    Pain Relieving Factors  massge and tens    Multiple Pain Sites  Yes    Pain Score  6    Pain Location  Back    Pain Orientation  Lower    Pain Descriptors / Indicators  Aching    Pain Type  Chronic pain    Pain Onset  More than a month ago    Aggravating Factors   standing and walking    Pain Relieving Factors  heat from hot shower    Effect of Pain on Daily Activities  limited standing and walking         [facility] PT Assessment - 07/26/19 0001      AROM   Right Shoulder Flexion  155 Degrees    Right Shoulder ABduction  120 Degrees      Strength   Right Shoulder Flexion  4/5    Right Shoulder Internal Rotation  4/5    Right Shoulder External Rotation  5/5      Palpation   Palpation comment  tedner  to palpation in the anterior shoulder                    [facility] Adult PT Treatment/Exercise - 07/26/19 0001      Lumbar Exercises: Stretches   Lower Trunk Rotation  10 seconds      Lumbar Exercises: Aerobic   Nustep  5 mim L4 LE only       Lumbar Exercises: Seated   Other Seated Lumbar Exercises  ball roll forward x10; side to side x10       Lumbar Exercises: Supine   Pelvic Tilt  15 reps    Clam  20 reps    Clam Limitations  cues for abdominal draw in    Bent Knee Raise  20 reps    Bridge  10 reps    Straight Leg Raise  10 reps      Shoulder Exercises: Seated   Other Seated Exercises  seated dowel pull over              PT Education - 07/26/19 1031    Education Details  reviewed HEP and stretches    Person(s) Educated  Patient    Methods  Explanation;Demonstration;Tactile cues;Verbal cues    Comprehension  Verbalized understanding;Returned demonstration;Verbal cues required;Tactile cues required       PT Short Term Goals - 07/26/19 1118      PT SHORT TERM GOAL #1   Title  independent with HEP    Baseline  no previous HEP    Time  3    Period  Weeks    Status  Achieved      PT SHORT TERM GOAL #2   Title  pt to verbalize/ demo techniques to reduce R shoulder pain/ inflammation via RICE and HEP    Baseline  TENS, unit and exercises    Time  3    Period  Weeks    Status  Achieved      PT SHORT TERM GOAL #3   Title  pt to demonstrate proper posture and gait mechanics with LRAD to reduce and prevent low back/ shoulder pain  Baseline  is working on it but still requires cuing    Time  3    Period  Weeks    Status  On-going    Target Date  08/16/19        PT Long Term Goals - 07/26/19 1122      PT LONG TERM GOAL #1   Title  incease R shoulder AROM flexion to >/= 120 and  abduction to >/= 110 degrees for functional mobility with </= 3/10 pain    Baseline  shoulder flexion 155 with minor pain    Time  6    Period  Weeks    Status   Partially Met      PT LONG TERM GOAL #2   Title  increase trunk mobility to [facility] with </=4/10 pain    Baseline  5/10 pain today    Time  6    Period  Weeks    Status  On-going      PT LONG TERM GOAL #3   Title  increase R shoulder strength to >/= 4/5 (in available ROM)    Baseline  4/5 flexion 4/5 ER/ 5/5 IR    Time  6    Period  Weeks    Status  Achieved      PT LONG TERM GOAL #4   Title  pt to be able to walk/ stand for >/= 15 min with LRAD for in home and community amb    Baseline  10 min max    Time  6    Period  Weeks    Status  On-going    Target Date  09/06/19      PT LONG TERM GOAL #5   Title  pt to be I with all HEP given as of last visit to maintain and progress current level of function    Baseline  therapy continues to add to HEP    Time  6    Period  Weeks    Status  On-going      PT LONG TERM GOAL #6   Title  pt to increase BERG balance score to >/= 35/56 to demo improvement in balance and safety    Baseline  not tested today    Period  Weeks    Status  On-going            Plan - 07/26/19 1113    Clinical Impression Statement  Patient has improved since her last re-assessment. Her shoulder Active ROM has improved to 155 degrees of flexion. Her shoulder flexion strength has improved to 4/5. She was able to reach behind her head today withou tincreased pain. She still has some wekaness in the shoulder. Her back pain hasimproved but it was still a5/10 today. She would benefit from further skilled therapy    Comorbidities  Hx of Cx,seizures, DM, incomplete spinal cord lesion t7-t12    Examination-Activity Limitations  Sit;Squat;Stairs;Stand;Lift;Bend    Stability/Clinical Decision Making  Unstable/Unpredictable    Clinical Decision Making  High    Rehab Potential  Excellent    PT Frequency  2x / week    PT Duration  4 weeks    PT Treatment/Interventions  ADLs/Self Care Home Management;Cryotherapy;Electrical Stimulation;Iontophoresis 67m/ml  Dexamethasone;Moist Heat;Ultrasound;DME Instruction;Gait training;Stair training;Functional mobility training;Therapeutic activities;Therapeutic exercise;Balance training;Neuromuscular re-education;Patient/family education;Manual techniques;Passive range of motion;Dry needling;Taping    PT Next Visit Plan  request additional medicaid visits. update HEP PRN, continue to reinforce benefits of walker , shoulder AAROM , review yellow band  rockwood) , scapular setting, core activation, modalities PRN, gentel trunk ROM due to hx or dizziness with forward mobility, Tai Chi techniques    PT Home Exercise Plan  [id number]: Shoulder table slides flexion/ abduction, scapular retraction, LTR, ab set, seated hamstring stretching and seated marching, shoulder isometrics (seated), rock[place] yellow band (given red)    Consulted and Agree with Plan of Care  Patient       Patient will benefit from skilled therapeutic intervention in order to improve the following deficits and impairments:  Abnormal gait, Decreased activity tolerance, Decreased balance, Decreased endurance, Decreased range of motion, Decreased safety awareness, Decreased strength, Difficulty walking, Increased fascial restricitons, Increased muscle spasms, Impaired flexibility, Postural dysfunction, Obesity, Pain, Improper body mechanics  Visit Diagnosis: Abnormality of gait - Plan: PT plan of care cert/re-cert  Chronic bilateral low back pain, unspecified whether sciatica present - Plan: PT plan of care cert/re-cert  Chronic right shoulder pain - Plan: PT plan of care cert/re-cert  Muscle weakness (generalized) - Plan: PT plan of care cert/re-cert      Problem List Patient Active Problem List   Diagnosis Date Noted  . Adrenal mass, right ([place]) 04/29/2019  . Chronic cough 03/12/2018  . Left shoulder pain 10/14/2016  . Hypertension 10/14/2016  . S/P thyroidectomy 06/27/2016  . Chronic bilateral low back pain without sciatica 04/22/2016  .  Liver lesion 12/24/2015  . Papillary thyroid carcinoma ([place]) 12/21/2015  . Controlled type 2 diabetes mellitus with complication, with long-term current use of insulin ([place]) 10/10/2015  . Vision loss 08/25/2015  . Decreased vision in both eyes 08/25/2015  . Prolonged Q-T interval on ECG 08/19/2015  . History of pulmonary embolism 08/10/2015  . Mild persistent asthma 04/13/2015  . Fibroid, uterine   . Morbid obesity ([place]) 11/11/2014  . Sinus tachycardia 11/08/2014  . Migraine 10/14/2014  . Menorrhagia 09/08/2014    Carney Living PT DPT  07/26/2019, 2:59 PM  [facility] [address] [place], [place], [address] Phone: [phone]   Fax:  [phone]  Name: Melinda [name] MRN: [id number] Date of Birth: 10-30-1965

## 2019-08-02 ENCOUNTER — Ambulatory Visit: Payer: Medicaid Other | Admitting: Physical Therapy

## 2019-08-03 DIAGNOSIS — Z1211 Encounter for screening for malignant neoplasm of colon: Secondary | ICD-10-CM | POA: Diagnosis not present

## 2019-08-03 DIAGNOSIS — R1310 Dysphagia, unspecified: Secondary | ICD-10-CM | POA: Diagnosis not present

## 2019-08-04 DIAGNOSIS — E1165 Type 2 diabetes mellitus with hyperglycemia: Secondary | ICD-10-CM | POA: Diagnosis not present

## 2019-08-09 ENCOUNTER — Encounter: Payer: Self-pay | Admitting: Physical Therapy

## 2019-08-09 ENCOUNTER — Ambulatory Visit: Payer: Medicaid Other | Attending: Internal Medicine | Admitting: Physical Therapy

## 2019-08-09 ENCOUNTER — Other Ambulatory Visit: Payer: Self-pay

## 2019-08-09 VITALS — BP 128/88 | HR 83

## 2019-08-09 DIAGNOSIS — M545 Low back pain: Secondary | ICD-10-CM | POA: Diagnosis not present

## 2019-08-09 DIAGNOSIS — R262 Difficulty in walking, not elsewhere classified: Secondary | ICD-10-CM | POA: Diagnosis not present

## 2019-08-09 DIAGNOSIS — G8929 Other chronic pain: Secondary | ICD-10-CM | POA: Insufficient documentation

## 2019-08-09 DIAGNOSIS — M6281 Muscle weakness (generalized): Secondary | ICD-10-CM | POA: Diagnosis not present

## 2019-08-09 DIAGNOSIS — M25511 Pain in right shoulder: Secondary | ICD-10-CM | POA: Insufficient documentation

## 2019-08-09 DIAGNOSIS — R269 Unspecified abnormalities of gait and mobility: Secondary | ICD-10-CM

## 2019-08-09 NOTE — Therapy (Signed)
[place], [place], [address] Phone: [phone]   Fax:  [phone]  Physical Therapy Treatment  Patient Details  Name: Melinda [name] MRN: [id number] Date of Birth: September 26, 1965 Referring Provider (PT): [name], MD (for back / abnromal gait),    Encounter Date: 08/09/2019  PT End of Session - 08/09/19 1105    Visit Number  10    Number of Visits  13    Date for PT Re-Evaluation  09/06/19    Authorization Type  MCD: resubmitted on 06/29/2019    Authorization Time Period  2/8/201-09/05/19    Authorization - Visit Number  1    Authorization - Number of Visits  8    PT Start Time  1100    PT Stop Time  8590    PT Time Calculation (min)  48 min       Past Medical History:  Diagnosis Date  . ADHD (attention deficit hyperactivity disorder)   . Arthritis   . Asthma   . Cancer ([place])   . Chicken pox AGE 54  . COPD (chronic obstructive pulmonary disease) ([place])    pt reported  . Diabetes mellitus without complication ([place])   . GERD (gastroesophageal reflux disease)   . Glaucoma    BOTH EYES, NO EYE DROPS  . Glaucoma   . History of blood transfusion MARCH 2016    2UNITS GIVEN AND IRON GIVEN  . Hyperglycemia 09/09/2014  . Hypertension   . 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 Iron deficiency anemia due to chronic blood loss 09/09/2014  . Low TSH level 09/08/2014  . Lumbar herniated disc   . Menorrhagia 09/08/2014  . Migraine    CLUSTER AND MIGRAINES  . Multiple thyroid nodules   . Ovarian mass, right 09/09/2014  . PE (pulmonary embolism) 2016  . Peripheral neuropathy    FINGER TIPS AND TOES NUMB SOME  . Preeclampsia  1983   WITH PREGNANCY  . PTSD (post-traumatic stress disorder)   . Scoliosis   . Seizures ([place]) AGE 35   NONE SINCE, HAD CHICKEN POX THEN    Past Surgical History:  Procedure Laterality Date  . ANGIOGRAM  TO LEG  08-13-15   RIGHT  . CHOLECYSTECTOMY    . IR GENERIC HISTORICAL  04/04/2016   IR RADIOLOGIST EVAL & MGMT 04/04/2016 [name], MD [facility] INTERV RAD  . THYROIDECTOMY N/A 11/17/2015   Procedure: TOTAL THYROIDECTOMY;  Surgeon: Armandina Gemma, MD;  Location: [facility];  Service: General;  Laterality: N/A;  . UTERINE ARTERY EMBOLIZATION Bilateral 08/13/2015    Vitals:   08/09/19 1103  BP: 128/88  Pulse: 83  SpO2: 97%    Subjective Assessment - 08/09/19 1103    Subjective  No shoulder pain.    Currently in Pain?  Yes    Pain Score  0-No pain    Pain Location  Shoulder    Aggravating Factors   when I use it too much    Pain Relieving Factors  rest    Pain Score  7    Pain Location  Back    Pain Orientation  Lower    Pain Descriptors / Indicators  Aching    Pain Frequency  Constant    Aggravating Factors   stanidng and walking    Pain Relieving Factors  heat  [facility] Adult PT Treatment/Exercise - 08/09/19 0001      Lumbar Exercises: Standing   Other Standing Lumbar Exercises  standing with head turns , trunk rotations, marcing (alternating and single leg with 1 UE support on Rollator       Lumbar Exercises: Seated   Sit to Stand  10 reps    Sit to Stand Limitations  hands at first, then hands on thighs       Shoulder Exercises: Seated   Other Seated Exercises  seated rockwood and rows -therapist assist       Shoulder Exercises: Pulleys   Flexion  2 minutes    Scaption  1 minute      Modalities   Modalities  Moist Heat      Moist Heat Therapy   Number Minutes Moist Heat  10 Minutes    Moist Heat Location  Lumbar Spine   seated              PT Short Term Goals - 07/26/19 1118      PT SHORT TERM GOAL #1   Title  independent with HEP    Baseline  no previous HEP    Time  3    Period  Weeks    Status  Achieved      PT SHORT TERM GOAL #2   Title  pt to verbalize/ demo techniques to reduce R shoulder pain/ inflammation via  RICE and HEP    Baseline  TENS, unit and exercises    Time  3    Period  Weeks    Status  Achieved      PT SHORT TERM GOAL #3   Title  pt to demonstrate proper posture and gait mechanics with LRAD to reduce and prevent low back/ shoulder pain    Baseline  is working on it but still requires cuing    Time  3    Period  Weeks    Status  On-going    Target Date  08/16/19        PT Long Term Goals - 07/26/19 1122      PT LONG TERM GOAL #1   Title  incease R shoulder AROM flexion to >/= 120 and  abduction to >/= 110 degrees for functional mobility with </= 3/10 pain    Baseline  shoulder flexion 155 with minor pain    Time  6    Period  Weeks    Status  Partially Met      PT LONG TERM GOAL #2   Title  increase trunk mobility to [facility] with </=4/10 pain    Baseline  5/10 pain today    Time  6    Period  Weeks    Status  On-going      PT LONG TERM GOAL #3   Title  increase R shoulder strength to >/= 4/5 (in available ROM)    Baseline  4/5 flexion 4/5 ER/ 5/5 IR    Time  6    Period  Weeks    Status  Achieved      PT LONG TERM GOAL #4   Title  pt to be able to walk/ stand for >/= 15 min with LRAD for in home and community amb    Baseline  10 min max    Time  6    Period  Weeks    Status  On-going    Target Date  09/06/19      PT LONG  TERM GOAL #5   Title  pt to be I with all HEP given as of last visit to maintain and progress current level of function    Baseline  therapy continues to add to HEP    Time  6    Period  Weeks    Status  On-going      PT LONG TERM GOAL #6   Title  pt to increase BERG balance score to >/= 35/56 to demo improvement in balance and safety    Baseline  not tested today    Period  Weeks    Status  On-going            Plan - 08/09/19 1151    Clinical Impression Statement  Pt arrives with no shoulder pain and 7/10 Lumbar pain. Progressed with Standing balance and progressed shoulder  band to green. She had tighness in her chest when she  tranisitoned to supine. Her vitals were normal and symptoms resloved after seated rest and inhaler.    PT Next Visit Plan  request additional medicaid visits. update HEP PRN, continue to reinforce benefits of walker , shoulder AAROM , review yellow band [place]) , scapular setting, core activation, modalities PRN, gentel trunk ROM due to hx or dizziness with forward mobility, Tai Chi techniques    PT Home Exercise Plan  [id number]: Shoulder table slides flexion/ abduction, scapular retraction, LTR, ab set, seated hamstring stretching and seated marching, shoulder isometrics (seated), [place] yellow band (given red)       Patient will benefit from skilled therapeutic intervention in order to improve the following deficits and impairments:  Abnormal gait, Decreased activity tolerance, Decreased balance, Decreased endurance, Decreased range of motion, Decreased safety awareness, Decreased strength, Difficulty walking, Increased fascial restricitons, Increased muscle spasms, Impaired flexibility, Postural dysfunction, Obesity, Pain, Improper body mechanics  Visit Diagnosis: Abnormality of gait  Chronic bilateral low back pain, unspecified whether sciatica present  Chronic right shoulder pain  Muscle weakness (generalized)  Difficulty in walking, not elsewhere classified  Chronic bilateral low back pain without sciatica     Problem List Patient Active Problem List   Diagnosis Date Noted  . Adrenal mass, right ([place]) 04/29/2019  . Chronic cough 03/12/2018  . Left shoulder pain 10/14/2016  . Hypertension 10/14/2016  . S/P thyroidectomy 06/27/2016  . Chronic bilateral low back pain without sciatica 04/22/2016  . Liver lesion 12/24/2015  . Papillary thyroid carcinoma ([place]) 12/21/2015  . Controlled type 2 diabetes mellitus with complication, with long-term current use of insulin ([place]) 10/10/2015  . Vision loss 08/25/2015  . Decreased vision in both eyes 08/25/2015  . Prolonged Q-T interval  on ECG 08/19/2015  . History of pulmonary embolism 08/10/2015  . Mild persistent asthma 04/13/2015  . Fibroid, uterine   . Morbid obesity ([place]) 11/11/2014  . Sinus tachycardia 11/08/2014  . Migraine 10/14/2014  . Menorrhagia 09/08/2014    Dorene Ar, PTA 08/09/2019, 12:03 PM  [facility] [address] [place], [place], [address] Phone: [phone]   Fax:  [phone]  Name: Melinda [name] MRN: [id number] Date of Birth: 1966/03/09

## 2019-08-13 ENCOUNTER — Telehealth: Payer: Self-pay | Admitting: Internal Medicine

## 2019-08-13 NOTE — Telephone Encounter (Signed)
Patient has a procedure on 08/19/2019 and needs to stop her Eliquis 5mg  at least by tomorrow 08/14/2019. [name] stated a fax has been sent regarding matter. Please follow up at your earliest convenience.

## 2019-08-16 ENCOUNTER — Telehealth: Payer: Self-pay

## 2019-08-16 ENCOUNTER — Ambulatory Visit: Payer: Medicaid Other | Admitting: Physical Therapy

## 2019-08-16 DIAGNOSIS — Z1159 Encounter for screening for other viral diseases: Secondary | ICD-10-CM | POA: Diagnosis not present

## 2019-08-16 NOTE — Telephone Encounter (Signed)
Patient is requesting to be exempt from jury duty. Would like her provider to provide her with a letter that explains that she;s high risk for Covid-19

## 2019-08-17 NOTE — Telephone Encounter (Signed)
Faxed medical clearance last Thursday on February 11th

## 2019-08-17 NOTE — Progress Notes (Signed)
Virtual Visit via Video Note The purpose of this virtual visit is to provide medical care while limiting exposure to the novel coronavirus.    Consent was obtained for video visit:  Yes.   Answered questions that patient had about telehealth interaction:  Yes.   I discussed the limitations, risks, security and privacy concerns of performing an evaluation and management service by telemedicine. I also discussed with the patient that there [name] be a patient responsible charge related to this service. The patient expressed understanding and agreed to proceed.  Pt location: Home Physician Location: office Name of referring provider:  [name], MD I connected with Melinda [name] at patients initiation/request on 08/18/2019 at  8:50 AM EST by video enabled telemedicine application and verified that I am speaking with the correct person using two identifiers. Pt MRN:  [id number] Pt DOB:  13-Oct-1965 Video Participants:  Melinda [name]   History of Present Illness:  Melinda [name] is a 54 year old right-handed African-American woman with type 2 diabetes mellitus, glaucoma, asthma, chronic low back pain, PTSD, PE/DVT and history of thyroid cancer status post thyroidectomy who follows up for migraine.  UPDATE: Intensity:  Moderate Duration:  1 hour Frequency:  3 days per month Frequency of abortive medication:Has not been treating migraines. Current NSAIDS:Contraindicated (on anticoagulation) Current analgesics:Tramadol Current triptans:none Current ergotamine:none Current anti-emetic:Zofran ODT 4mg  Current muscle relaxants:methocarbamol Current anti-anxiolytic:none Current sleep aide:none Current Antihypertensive medications:Metoprolol 50mg  twice daily, HCTZ Current Antidepressant medications:Wellbutrin 150mg , citalopram 10mg  Current Anticonvulsant medications:topiramate200 mg at bedtime, gabapentin 100mg  three times daily Current anti-CGRP:None Current  Vitamins/Herbal/Supplements:Ferrous sulfate Current Antihistamines/Decongestants:Benadryl Other therapy:Essential oils, lavenderSees psychiatrist and psychologist which helps  Caffeine:No Alcohol:No Smoker:No Diet:Hydrates. No soda. Eats baked and boiled chicken, mostly vegetables. Little red meat only during menses. No fried foods. Exercise:Stationary bike, limited due to chronic pain Depression:Yes; Anxiety:Yes Other pain:Chronic pain Sleep hygiene:Poor. Has night terrors secondary to PTSD.  Sleep study last week showed no sleep apnea.    HISTORY: Onset:  Since her 24s Location: Right sided Quality: squeezing Initial Intensity: 8.5/10 (sometimes 10/10) Aura: Sometimes preceded with visual aura (sees a pinwheel) Prodrome: no Associated symptoms: Nausea, photophobia, phonophobia. No worse headache of life, change in quality, wakes her up from sleep. Initial Duration: 3 hours to all day Initial Frequency: daily Triggers:  Apples, aged cheese, menstrual cycle Relieving factors:  None Activity: Light activity aggravates  Past NSAIDS: ibuprofen Past analgesics: Excedrin Past abortive triptans: Sumatriptan tablet/NS/[place], rizatriptan 10mg  Past muscle relaxants: no Past anti-emetic: no Past antihypertensive medications: no Past antidepressant medications: amitriptyline 50mg  Past anticonvulsant medications: no Past vitamins/Herbal/Supplements: no Past antihistamines/decongestants: no Other past therapies: no  Family history of headache: Son has migraines.  CT of head from 01/16/15 was unremarkable.  In September 2018, she developed a "thunderclap" headache. Onset was over a month ago. It lasts for a couple of seconds and occurs once a week. She has not had any vision loss, unilateral numbness or weakness or vertigo.  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  Past Medical History: Past Medical History:  Diagnosis Date  . ADHD (attention deficit hyperactivity disorder)   . Arthritis   . Asthma   . Cancer ([place])   . Chicken pox AGE 22  . COPD (chronic obstructive pulmonary disease) ([place])    pt reported  . Diabetes mellitus without complication ([place])   . GERD (gastroesophageal reflux disease)   .  Glaucoma    BOTH EYES, NO EYE DROPS  . Glaucoma   . History of blood transfusion MARCH 2016    2UNITS GIVEN AND IRON GIVEN  . Hyperglycemia 09/09/2014  . Hypertension   . 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 Iron deficiency anemia due to chronic blood loss 09/09/2014  . Low TSH level 09/08/2014  . Lumbar herniated disc   . Menorrhagia 09/08/2014  . Migraine    CLUSTER AND MIGRAINES  . Multiple thyroid nodules   . Ovarian mass, right 09/09/2014  . PE (pulmonary embolism) 2016  . Peripheral neuropathy    FINGER TIPS AND TOES NUMB SOME  . Preeclampsia  1983   WITH PREGNANCY  . PTSD (post-traumatic stress disorder)   . Scoliosis   . Seizures ([place]) AGE 64   NONE SINCE, HAD CHICKEN POX THEN    Medications: Outpatient Encounter Medications as of 08/18/2019  Medication Sig  . ACCU-CHEK AVIVA PLUS test strip USE TO TEST BLOOD GLUCOSE FOUR TIMES DAILY BEFORE MEALS AND AT BEDTIME (Patient not taking: Reported on 05/07/2019)  . ACCU-CHEK SOFTCLIX LANCETS lancets   . albuterol (PROVENTIL) (2.5 MG/3ML) 0.083% nebulizer solution TAKE 3 MLS (2.5 MG TOTAL) BY NEBULIZATION EVERY 6 HOURS AS NEEDED FORWHEEZING OR SHORTNESS OF BREATH (Patient taking differently: Take 2.5 mg by nebulization every 6 (six) hours as needed for wheezing or shortness of breath. )  . atorvastatin (LIPITOR) 20 MG tablet TAKE 1 TABLET BY MOUTH ONCE A DAY  . budesonide-formoterol (SYMBICORT) 160-4.5 MCG/ACT inhaler Inhale 2 puffs into the lungs 2  (two) times daily.  Marland Kitchen buPROPion (WELLBUTRIN XL) 300 MG 24 hr tablet Take 300 mg by mouth daily.   . busPIRone (BUSPAR) 5 MG tablet Take 5 mg by mouth 3 (three) times daily.  . citalopram (CELEXA) 40 MG tablet Take 40 mg by mouth daily.   Marland Kitchen doxepin (SINEQUAN) 10 MG capsule Take 10 mg by mouth.  Arne Cleveland 5 MG TABS tablet TAKE 1 TABLET BY MOUTH 2 TIMES DAILY.  . fluticasone (CUTIVATE) 0.05 % cream Apply topically 2 (two) times daily.  Marland Kitchen gabapentin (NEURONTIN) 100 MG capsule TAKE 1 CAPSULE (100 MG TOTAL) BY MOUTH 3 (THREE) TIMES DAILY.  . hydrochlorothiazide (MICROZIDE) 12.5 MG capsule TAKE 1 CAPSULE BY MOUTH ONCE DAILY  . loratadine (CLARITIN) 10 MG tablet Take 1 tablet (10 mg total) by mouth daily.  . methocarbamol (ROBAXIN) 500 MG tablet TAKE 2 TABLETS BY MOUTH EVERY 8 HOURS AS NEEDED FOR MUSCLE SPASMS  . metoprolol tartrate (LOPRESSOR) 50 MG tablet TAKE 1 TABLET BY MOUTH TWO TIMES DAILY  . montelukast (SINGULAIR) 10 MG tablet TAKE 1 TABLET BY MOUTH EVERY EVENING AT BEDTIME  . PAZEO 0.7 % SOLN   . prazosin (MINIPRESS) 1 MG capsule Take 1 mg by mouth at bedtime.  Marland Kitchen SYNTHROID 200 MCG tablet   . topiramate (TOPAMAX) 200 MG tablet TAKE 1 TABLET BY MOUTH AT BEDTIME  . traMADol (ULTRAM) 50 MG tablet TAKE 1 TABLET BY MOUTH DAILY AS NEEDED FOR MODERATE OR SEVERE PAIN.   No facility-administered encounter medications on file as of 08/18/2019.    Allergies: Allergies  Allergen Reactions  . Ceftriaxone Anaphylaxis    ROCEPHIN  . Penicillins Shortness Of Breath    Has patient had a PCN reaction causing immediate rash, facial/tongue/throat swelling, SOB or lightheadedness with hypotension: Yes Has patient had a PCN reaction causing severe rash  involving mucus membranes or skin necrosis: No Has patient had a PCN reaction that required hospitalization No Has patient had a PCN reaction occurring within the last 10 years: No If all of the above answers are "NO", then [name] proceed with Cephalosporin  use.   . Shellfish Allergy Anaphylaxis  . Flonase [Fluticasone Propionate]     Makes migraines worse  . Gold-Containing Drug Products     HANDS ITCH  . Nickel     HANDS SWELL  . Citrus Rash    Family History: Family History  Problem Relation Age of Onset  . Diabetes Mother   . Breast cancer Mother   . CAD Mother   . Hypertension Mother   . Alcohol abuse Father   . Hypertension Father   . Breast cancer Maternal Aunt   . Breast cancer Maternal Aunt     Social History: Social History   Socioeconomic History  . Marital status: Single    Spouse name: Not on file  . Number of children: 1  . Years of education: 39  . Highest education level: Some college, no degree  Occupational History  . Occupation: disabled  Tobacco Use  . Smoking status: Former Smoker    Packs/day: 0.50    Years: 40.00    Pack years: 20.00    Types: Cigarettes    Quit date: 11/03/2014    Years since quitting: 4.7  . Smokeless tobacco: Never Used  Substance and Sexual Activity  . Alcohol use: No    Alcohol/week: 0.0 standard drinks  . Drug use: No  . Sexual activity: Never    Birth control/protection: None  Other Topics Concern  . Not on file  Social History Narrative   Patient is right-handed. She lives in a one level handicap accessible apartment. She avoids caffeine. She uses stationary stationary pedals to use for exercise.   Social Determinants of Health   Financial Resource Strain:   . Difficulty of Paying Living Expenses: Not on file  Food Insecurity:   . Worried About Charity fundraiser in the Last Year: Not on file  . Ran Out of Food in the Last Year: Not on file  Transportation Needs:   . Lack of Transportation (Medical): Not on file  . Lack of Transportation (Non-Medical): Not on file  Physical Activity:   . Days of Exercise per Week: Not on file  . Minutes of Exercise per Session: Not on file  Stress:   . Feeling of Stress : Not on file  Social Connections:   . Frequency  of Communication with Friends and Family: Not on file  . Frequency of Social Gatherings with Friends and Family: Not on file  . Attends Religious Services: Not on file  . Active Member of Clubs or Organizations: Not on file  . Attends Archivist Meetings: Not on file  . Marital Status: Not on file  Intimate Partner Violence:   . Fear of Current or Ex-Partner: Not on file  . Emotionally Abused: Not on file  . Physically Abused: Not on file  . Sexually Abused: Not on file    Observations/Objective:   Height 5\' 9"  (1.753 m), weight (!) 322 lb (146.1 kg). No acute distress.  Alert and oriented.  Speech fluent and not dysarthric.  Language intact.  Eyes orthophoric on primary gaze.  Face symmetric.  Assessment and Plan:   Migraine without aura, without status migrainosus, not intractable  1.  For preventative management, topiramate 200mg  at bedtime (refilled)  2.  Limit use of pain relievers to no more than 2 days out of week to prevent risk of rebound or medication-overuse headache. 3.  Keep headache diary 4.  Exercise, hydration, caffeine cessation, improve sleep hygiene, monitor for and avoid triggers 5. Follow up 9 months   Follow Up Instructions:    -I discussed the assessment and treatment plan with the patient. The patient was provided an opportunity to ask questions and all were answered. The patient agreed with the plan and demonstrated an understanding of the instructions.   The patient was advised to call back or seek an in-person evaluation if the symptoms worsen or if the condition fails to improve as anticipated.   Dudley Major, DO

## 2019-08-18 ENCOUNTER — Ambulatory Visit: Payer: Medicaid Other | Admitting: Sports Medicine

## 2019-08-18 ENCOUNTER — Telehealth (INDEPENDENT_AMBULATORY_CARE_PROVIDER_SITE_OTHER): Payer: Medicaid Other | Admitting: Neurology

## 2019-08-18 ENCOUNTER — Other Ambulatory Visit: Payer: Self-pay

## 2019-08-18 ENCOUNTER — Other Ambulatory Visit: Payer: Self-pay | Admitting: Internal Medicine

## 2019-08-18 ENCOUNTER — Encounter: Payer: Self-pay | Admitting: Neurology

## 2019-08-18 VITALS — Ht 69.0 in | Wt 322.0 lb

## 2019-08-18 DIAGNOSIS — G8929 Other chronic pain: Secondary | ICD-10-CM

## 2019-08-18 DIAGNOSIS — G43009 Migraine without aura, not intractable, without status migrainosus: Secondary | ICD-10-CM | POA: Diagnosis not present

## 2019-08-18 DIAGNOSIS — M545 Low back pain, unspecified: Secondary | ICD-10-CM

## 2019-08-18 MED ORDER — TOPIRAMATE 200 MG PO TABS: 200.0000 mg | ORAL_TABLET | Freq: Every day | ORAL | 3 refills | Status: DC

## 2019-08-18 NOTE — Telephone Encounter (Signed)
Contacted pt and made aware that letter is ready for pickup pt is requesting that her letter be mailed

## 2019-08-23 ENCOUNTER — Ambulatory Visit: Payer: Medicaid Other | Admitting: Physical Therapy

## 2019-08-23 ENCOUNTER — Encounter: Payer: Self-pay | Admitting: Physical Therapy

## 2019-08-23 ENCOUNTER — Other Ambulatory Visit: Payer: Self-pay

## 2019-08-23 ENCOUNTER — Other Ambulatory Visit: Payer: Self-pay | Admitting: Internal Medicine

## 2019-08-23 DIAGNOSIS — M545 Low back pain: Secondary | ICD-10-CM | POA: Diagnosis not present

## 2019-08-23 DIAGNOSIS — E118 Type 2 diabetes mellitus with unspecified complications: Secondary | ICD-10-CM

## 2019-08-23 DIAGNOSIS — G8929 Other chronic pain: Secondary | ICD-10-CM

## 2019-08-23 DIAGNOSIS — M6281 Muscle weakness (generalized): Secondary | ICD-10-CM | POA: Diagnosis not present

## 2019-08-23 DIAGNOSIS — I10 Essential (primary) hypertension: Secondary | ICD-10-CM

## 2019-08-23 DIAGNOSIS — R269 Unspecified abnormalities of gait and mobility: Secondary | ICD-10-CM

## 2019-08-23 DIAGNOSIS — R262 Difficulty in walking, not elsewhere classified: Secondary | ICD-10-CM | POA: Diagnosis not present

## 2019-08-23 DIAGNOSIS — M25511 Pain in right shoulder: Secondary | ICD-10-CM | POA: Diagnosis not present

## 2019-08-23 DIAGNOSIS — Z794 Long term (current) use of insulin: Secondary | ICD-10-CM

## 2019-08-23 NOTE — Therapy (Addendum)
Melinda [name], [place], [address] Phone: [phone]   Fax:  [phone]  Physical Therapy Treatment / Discharge  Patient Details  Name: Melinda [name] MRN: [id number] Date of Birth: [birth year] Referring Provider (PT): [name], MD (for back / abnromal gait),    Encounter Date: 08/23/2019  PT End of Session - 08/23/19 1105    Visit Number  11    Number of Visits  13    Date for PT Re-Evaluation  09/06/19    Authorization Type  MCD: resubmitted on 06/29/2019    Authorization Time Period  2/8/201-09/05/19    Authorization - Visit Number  2    Authorization - Number of Visits  8    PT Start Time  1101    PT Stop Time  1140    PT Time Calculation (min)  39 min    Activity Tolerance  Patient tolerated treatment well;Patient limited by pain    Behavior During Therapy  [facility] for tasks assessed/performed       Past Medical History:  Diagnosis Date  . ADHD (attention deficit hyperactivity disorder)   . Arthritis   . Asthma   . Cancer ([place])   . Chicken pox AGE 33  . COPD (chronic obstructive pulmonary disease) ([place])    pt reported  . Diabetes mellitus without complication ([place])   . GERD (gastroesophageal reflux disease)   . Glaucoma    BOTH EYES, NO EYE DROPS  . Glaucoma   . History of blood transfusion MARCH 2016    2UNITS GIVEN AND IRON GIVEN  . Hyperglycemia 09/09/2014  . Hypertension   . 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 Iron deficiency anemia due to chronic blood loss 09/09/2014  . Low TSH level 09/08/2014  . Lumbar herniated disc   . Menorrhagia 09/08/2014  . Migraine    CLUSTER AND MIGRAINES  . Multiple thyroid nodules   . Ovarian mass, right 09/09/2014  . PE (pulmonary embolism) 2016  . Peripheral neuropathy    FINGER TIPS AND TOES NUMB SOME  . Preeclampsia  1983   WITH PREGNANCY  . PTSD (post-traumatic stress  disorder)   . Scoliosis   . Seizures ([place]) AGE 89   NONE SINCE, HAD CHICKEN POX THEN    Past Surgical History:  Procedure Laterality Date  . ANGIOGRAM TO LEG  08-13-15   RIGHT  . CHOLECYSTECTOMY    . IR GENERIC HISTORICAL  04/04/2016   IR RADIOLOGIST EVAL & MGMT 04/04/2016 [name], MD [facility] INTERV RAD  . THYROIDECTOMY N/A 11/17/2015   Procedure: TOTAL THYROIDECTOMY;  Surgeon: Armandina Gemma, MD;  Location: [facility] ORS;  Service: General;  Laterality: N/A;  . UTERINE ARTERY EMBOLIZATION Bilateral 08/13/2015    There were no vitals filed for this visit.  Subjective Assessment - 08/23/19 1107    Subjective  "The shoulder is still doing pretty good still. The back isn't too bad, but I keeping losing feeling in my feet which started about amongth ago and the R entire leg"    Currently in Pain?  Yes    Pain Score  0-No pain    Pain Location  Shoulder    Pain Orientation  Right    Pain Score  4    Pain Location  Back    Pain Orientation  Lower    Pain Descriptors / Indicators  Aching  Pain Type  Chronic pain    Aggravating Factors   prolonted standing/ walking    Pain Relieving Factors  heat         [facility] PT Assessment - 08/23/19 0001      Assessment   Medical Diagnosis  R shoulder pain, Chronic bilateral low back pain without sciatica M54.5, G89.29, Gait disturbance R26.9    Referring Provider (PT)  [name], MD (for back / abnromal gait),                    [facility] Adult PT Treatment/Exercise - 08/23/19 0001      Lumbar Exercises: Stretches   Lower Trunk Rotation  10 seconds      Lumbar Exercises: Seated   Sit to Stand  --   12 reps with hands on knees     Lumbar Exercises: Supine   Pelvic Tilt  15 reps   in decompressed position   Clam  20 reps    Clam Limitations  with green theraband    Bent Knee Raise  15 reps   with green band around knees   Bridge  10 reps   with initial pelvic tilt x 2 sets     Shoulder Exercises: Supine   Horizontal  ABduction  Strengthening;Both;12 reps;Theraband   x 2 sets   Theraband Level (Shoulder Horizontal ABduction)  Level 2 (Red)      Shoulder Exercises: Seated   Row  Both;15 reps;Theraband    Theraband Level (Shoulder Row)  Level 3 (Green)      Manual Therapy   Manual Therapy  Manual Traction             PT Education - 08/23/19 1140    Education Details  discussed keeping diary of what makes it better, worse, what activity she was doing and how long it took to get better.    Person(s) Educated  Patient    Methods  Explanation;Verbal cues    Comprehension  Verbalized understanding;Verbal cues required       PT Short Term Goals - 07/26/19 1118      PT SHORT TERM GOAL #1   Title  independent with HEP    Baseline  no previous HEP    Time  3    Period  Weeks    Status  Achieved      PT SHORT TERM GOAL #2   Title  pt to verbalize/ demo techniques to reduce R shoulder pain/ inflammation via RICE and HEP    Baseline  TENS, unit and exercises    Time  3    Period  Weeks    Status  Achieved      PT SHORT TERM GOAL #3   Title  pt to demonstrate proper posture and gait mechanics with LRAD to reduce and prevent low back/ shoulder pain    Baseline  is working on it but still requires cuing    Time  3    Period  Weeks    Status  On-going    Target Date  08/16/19        PT Long Term Goals - 07/26/19 1122      PT LONG TERM GOAL #1   Title  incease R shoulder AROM flexion to >/= 120 and  abduction to >/= 110 degrees for functional mobility with </= 3/10 pain    Baseline  shoulder flexion 155 with minor pain    Time  6    Period  Weeks    Status  Partially Met      PT LONG TERM GOAL #2   Title  increase trunk mobility to [facility] with </=4/10 pain    Baseline  5/10 pain today    Time  6    Period  Weeks    Status  On-going      PT LONG TERM GOAL #3   Title  increase R shoulder strength to >/= 4/5 (in available ROM)    Baseline  4/5 flexion 4/5 ER/ 5/5 IR    Time  6     Period  Weeks    Status  Achieved      PT LONG TERM GOAL #4   Title  pt to be able to walk/ stand for >/= 15 min with LRAD for in home and community amb    Baseline  10 min max    Time  6    Period  Weeks    Status  On-going    Target Date  09/06/19      PT LONG TERM GOAL #5   Title  pt to be I with all HEP given as of last visit to maintain and progress current level of function    Baseline  therapy continues to add to HEP    Time  6    Period  Weeks    Status  On-going      PT LONG TERM GOAL #6   Title  pt to increase BERG balance score to >/= 35/56 to demo improvement in balance and safety    Baseline  not tested today    Period  Weeks    Status  On-going            Plan - 08/23/19 1128    Clinical Impression Statement  pt reports the shoulder continues to do better, and that her back is only at a 4/10 pain today. continued working on shoulder / scapular stability strengthening, but focused mostly on the back with hip/ core strengthening. . pt did report a new occurence of bil feet N/T starting in the plantar aspect but reports no specific MOI or causing/ relieving factors. she noted no increase in pain in the shoulder or back.    PT Treatment/Interventions  ADLs/Self Care Home Management;Cryotherapy;Electrical Stimulation;Iontophoresis 20m/ml Dexamethasone;Moist Heat;Ultrasound;DME Instruction;Gait training;Stair training;Functional mobility training;Therapeutic activities;Therapeutic exercise;Balance training;Neuromuscular re-education;Patient/family education;Manual techniques;Passive range of motion;Dry needling;Taping    PT Next Visit Plan  request additional medicaid visits. update HEP PRN, continue to reinforce benefits of walker , shoulder AAROM , review yellow band rockwood) , scapular setting, core activation, modalities PRN, gentel trunk ROM due to hx or dizziness with forward mobility    PT Home Exercise Plan  [id number]: Shoulder table slides flexion/ abduction,  scapular retraction, LTR, ab set, seated hamstring stretching and seated marching, shoulder isometrics (seated), rockwood yellow band (given red)    Consulted and Agree with Plan of Care  Patient       Patient will benefit from skilled therapeutic intervention in order to improve the following deficits and impairments:  Abnormal gait, Decreased activity tolerance, Decreased balance, Decreased endurance, Decreased range of motion, Decreased safety awareness, Decreased strength, Difficulty walking, Increased fascial restricitons, Increased muscle spasms, Impaired flexibility, Postural dysfunction, Obesity, Pain, Improper body mechanics  Visit Diagnosis: Abnormality of gait  Chronic bilateral low back pain, unspecified whether sciatica present  Chronic right shoulder pain  Muscle weakness (generalized)     Problem List Patient Active Problem List  Diagnosis Date Noted  . Adrenal mass, right ([place]) 04/29/2019  . Chronic cough 03/12/2018  . Left shoulder pain 10/14/2016  . Hypertension 10/14/2016  . S/P thyroidectomy 06/27/2016  . Chronic bilateral low back pain without sciatica 04/22/2016  . Liver lesion 12/24/2015  . Papillary thyroid carcinoma ([place]) 12/21/2015  . Controlled type 2 diabetes mellitus with complication, with long-term current use of insulin ([place]) 10/10/2015  . Vision loss 08/25/2015  . Decreased vision in both eyes 08/25/2015  . Prolonged Q-T interval on ECG 08/19/2015  . History of pulmonary embolism 08/10/2015  . Mild persistent asthma 04/13/2015  . Fibroid, uterine   . Morbid obesity ([place]) 11/11/2014  . Sinus tachycardia 11/08/2014  . Migraine 10/14/2014  . Menorrhagia 09/08/2014    Starr Lake PT, DPT, LAT, ATC  08/23/19  11:42 AM      [facility] [address] [place], [place], [address] Phone: [phone]   Fax:  [phone]  Name: Melinda [name] MRN: [id number] Date of Birth:  [birth year]        PHYSICAL THERAPY DISCHARGE SUMMARY  Visits from Start of Care: 11  Current functional level related to goals / functional outcomes: See goals   Remaining deficits: Current status unknown due to pt not returning.    Education / Equipment: HEP, theraband, posture  Plan: Patient agrees to discharge.  Patient goals were not met. Patient is being discharged due to not returning since the last visit.  ?????        Quashawn Jewkes PT, DPT, LAT, ATC  09/21/19  1:32 PM

## 2019-08-25 ENCOUNTER — Ambulatory Visit (INDEPENDENT_AMBULATORY_CARE_PROVIDER_SITE_OTHER): Payer: Medicaid Other | Admitting: Sports Medicine

## 2019-08-25 ENCOUNTER — Other Ambulatory Visit: Payer: Self-pay

## 2019-08-25 ENCOUNTER — Encounter: Payer: Self-pay | Admitting: Sports Medicine

## 2019-08-25 VITALS — BP 123/65 | Ht 69.75 in | Wt 322.0 lb

## 2019-08-25 DIAGNOSIS — M25511 Pain in right shoulder: Secondary | ICD-10-CM | POA: Diagnosis not present

## 2019-08-25 NOTE — Progress Notes (Addendum)
   [facility] [address] [place], [place] [address] Phone: [phone] Fax: [phone]   Patient Name: Melinda [name] Date of Birth: 03-16-1966 Medical Record Number: [id number] Gender: female Date of Encounter: 08/25/2019  SUBJECTIVE:      Chief Complaint:  Follow-up right shoulder   HPI:  Melinda [name] is following up for right shoulder pain.  States since starting physical therapy her symptoms have vastly improved.  She is able to perform all overhead activities.  She will get occasional anterior shoulder pain that resolves.  She does not want an injection at this time.  He has also started working low back exercises at rehab that has helped.  She has been consciously working on diet and exercise.  She is scheduled to see a podiatrist in the coming weeks for bilateral foot numbness.     ROS:     See HPI.   PERTINENT  PMH / PSH / FH / SH:  Past Medical, Surgical, Social, and Family History Reviewed & Updated in the EMR.   OBJECTIVE:  BP 123/65   Ht 5' 9.75" (1.772 m)   Wt (!) 322 lb (146.1 kg)   BMI 46.53 kg/m  Physical Exam:  Vital signs are reviewed.   GEN: Alert and oriented, NAD Pulm: Breathing unlabored PSY: normal mood, congruent affect  MSK: Right shoulder Well developed, well nourished, in no acute distress. No swelling, ecchymoses.  No gross deformity. No TTP. FROM. Strength 4+/5  with empty can Strength 5/5 and resisted internal/external rotation. Negative Hawkins, Neers. Negative Yergasons. Negative apprehension. NV intact distally.    ASSESSMENT & PLAN:   1. Right shoulder pain  Overall, patient is doing much better.  Explained that we do not need to consider an injection at this time and it is important to continue with home exercises to prevent further injury.  Can follow-up with me on an as-needed basis.  Explained that her history of uncontrolled diabetes is likely the reason for bilateral foot numbness and  tingling.  Recommended she follow-up with her PCP for further prognosis.   Lanier Clam, DO, ATC Sports Medicine Fellow  Addendum:  I was the preceptor for this visit and available for immediate consultation.  Karlton Lemon MD Kirt Boys

## 2019-09-01 DIAGNOSIS — E1165 Type 2 diabetes mellitus with hyperglycemia: Secondary | ICD-10-CM | POA: Diagnosis not present

## 2019-09-02 DIAGNOSIS — F331 Major depressive disorder, recurrent, moderate: Secondary | ICD-10-CM | POA: Diagnosis not present

## 2019-09-02 DIAGNOSIS — F4310 Post-traumatic stress disorder, unspecified: Secondary | ICD-10-CM | POA: Diagnosis not present

## 2019-09-02 DIAGNOSIS — F411 Generalized anxiety disorder: Secondary | ICD-10-CM | POA: Diagnosis not present

## 2019-09-03 DIAGNOSIS — Z7189 Other specified counseling: Secondary | ICD-10-CM | POA: Diagnosis not present

## 2019-09-03 DIAGNOSIS — Z85850 Personal history of malignant neoplasm of thyroid: Secondary | ICD-10-CM | POA: Diagnosis not present

## 2019-09-03 DIAGNOSIS — E119 Type 2 diabetes mellitus without complications: Secondary | ICD-10-CM | POA: Diagnosis not present

## 2019-09-03 DIAGNOSIS — E890 Postprocedural hypothyroidism: Secondary | ICD-10-CM | POA: Diagnosis not present

## 2019-09-06 ENCOUNTER — Ambulatory Visit: Payer: Medicaid Other | Admitting: Internal Medicine

## 2019-09-22 ENCOUNTER — Other Ambulatory Visit: Payer: Self-pay | Admitting: Internal Medicine

## 2019-09-30 DIAGNOSIS — E1165 Type 2 diabetes mellitus with hyperglycemia: Secondary | ICD-10-CM | POA: Diagnosis not present

## 2019-10-14 ENCOUNTER — Other Ambulatory Visit: Payer: Self-pay

## 2019-10-14 ENCOUNTER — Encounter: Payer: Self-pay | Admitting: Internal Medicine

## 2019-10-14 ENCOUNTER — Ambulatory Visit: Payer: Medicaid Other | Attending: Internal Medicine | Admitting: Internal Medicine

## 2019-10-14 DIAGNOSIS — G8929 Other chronic pain: Secondary | ICD-10-CM

## 2019-10-14 DIAGNOSIS — E66813 Obesity, class 3: Secondary | ICD-10-CM

## 2019-10-14 DIAGNOSIS — M545 Low back pain: Secondary | ICD-10-CM | POA: Diagnosis not present

## 2019-10-14 DIAGNOSIS — M25561 Pain in right knee: Secondary | ICD-10-CM

## 2019-10-14 DIAGNOSIS — Z6841 Body Mass Index (BMI) 40.0 and over, adult: Secondary | ICD-10-CM | POA: Diagnosis not present

## 2019-10-14 DIAGNOSIS — E118 Type 2 diabetes mellitus with unspecified complications: Secondary | ICD-10-CM | POA: Diagnosis not present

## 2019-10-14 DIAGNOSIS — M25562 Pain in left knee: Secondary | ICD-10-CM | POA: Diagnosis not present

## 2019-10-14 DIAGNOSIS — I10 Essential (primary) hypertension: Secondary | ICD-10-CM | POA: Diagnosis not present

## 2019-10-14 MED ORDER — METHOCARBAMOL 500 MG PO TABS: ORAL_TABLET | ORAL | 0 refills | Status: DC

## 2019-10-14 NOTE — Progress Notes (Signed)
Pt states she had a fall Tuesday  Pt states she has a few bruises on the both knees

## 2019-10-14 NOTE — Progress Notes (Signed)
Virtual Visit via Telephone Note Due to current restrictions/limitations of in-office visits due to the COVID-19 pandemic, this scheduled clinical appointment was converted to a telehealth visit  I connected with Melinda [name] on 10/14/19 at 2:57 p.m by telephone and verified that I am speaking with the correct person using two identifiers.  I am in my office.  The patient is at home.  Only the patient and myself participated in this encounter.  I discussed the limitations, risks, security and privacy concerns of performing an evaluation and management service by telephone and the availability of in person appointments. I also discussed with the patient that there [name] be a patient responsible charge related to this service. The patient expressed understanding and agreed to proceed.   History of Present Illness: Pt with hxofDM(followed by Dr. Jana Hakim, moderate persistent asthma,migraines followed by neurology, uterine fibroids followed by GYN, papillary thyroid CA s/pthyroidectomy, history of DVT/PE onEliquislifetime,obesity, PTSD/anxiety.  Slipped on some paper on the floor in her house 2 days ago.  Landed on her knees and hit mid section and RT elbow.  Some bruising of knees which she has been icing. She has completed P.T.  She has bands to continue at home.  HTN:  Has device but has not been checking.  Lasted checked when she saw Dr. Buddy Duty and BP was good.  No CP, SOB, LE edema  DM:  Saw Dr. Phyllis Ginger last mth.  A1C was 6 and wgh was 320.  Diet control. Doing well with eating habits.  Hx thyroid CA: Synthroid increased to 224 mcg daily by Dr. Buddy Duty.  Doing okay.  No hot or cold intolerance  HM:  She reschedule c-scope for later this mth.  Had [place] vaccine 09/24/2019  Outpatient Encounter Medications as of 10/14/2019  Medication Sig  . ACCU-CHEK AVIVA PLUS test strip USE TO TEST BLOOD GLUCOSE FOUR TIMES DAILY BEFORE MEALS AND AT BEDTIME  . ACCU-CHEK SOFTCLIX LANCETS lancets    . albuterol (PROVENTIL) (2.5 MG/3ML) 0.083% nebulizer solution TAKE 3 MLS (2.5 MG TOTAL) BY NEBULIZATION EVERY 6 HOURS AS NEEDED FORWHEEZING OR SHORTNESS OF BREATH (Patient taking differently: Take 2.5 mg by nebulization every 6 (six) hours as needed for wheezing or shortness of breath. )  . atorvastatin (LIPITOR) 20 MG tablet TAKE ONE TABLET BY MOUTH ONCE DAILY (BEDTIME)  . budesonide-formoterol (SYMBICORT) 160-4.5 MCG/ACT inhaler Inhale 2 puffs into the lungs 2 (two) times daily.  Marland Kitchen buPROPion (WELLBUTRIN XL) 300 MG 24 hr tablet Take 300 mg by mouth daily.   . busPIRone (BUSPAR) 5 MG tablet Take 5 mg by mouth 3 (three) times daily.  . citalopram (CELEXA) 40 MG tablet Take 40 mg by mouth daily.   Marland Kitchen doxepin (SINEQUAN) 10 MG capsule Take 10 mg by mouth.  Arne Cleveland 5 MG TABS tablet TAKE ONE TABLET BY MOUTH TWICE DAILY (AM+PM)  . fluticasone (CUTIVATE) 0.05 % cream Apply topically 2 (two) times daily.  Marland Kitchen gabapentin (NEURONTIN) 100 MG capsule TAKE ONE CAPSULE BY MOUTH THREE TIMES DAILY (AM+N+PM)  . hydrochlorothiazide (MICROZIDE) 12.5 MG capsule TAKE ONE CAPSULE BY MOUTH ONCE DAILY(AM)  . loratadine (CLARITIN) 10 MG tablet Take 1 tablet (10 mg total) by mouth daily.  . methocarbamol (ROBAXIN) 500 MG tablet TAKE TWO TABLETS BY MOUTH EVERY 8 HOURS AS NEEDED FOR MUSCLE SPASMS  . metoprolol tartrate (LOPRESSOR) 50 MG tablet TAKE ONE TABLET BY MOUTH TWICE DAILY (AM+PM)  . montelukast (SINGULAIR) 10 MG tablet TAKE 1 TABLET BY MOUTH EVERY EVENING AT  BEDTIME  . PAZEO 0.7 % SOLN   . prazosin (MINIPRESS) 1 MG capsule Take 2 mg by mouth at bedtime.   Marland Kitchen SYNTHROID 200 MCG tablet   . topiramate (TOPAMAX) 200 MG tablet Take 1 tablet (200 mg total) by mouth at bedtime.  . traMADol (ULTRAM) 50 MG tablet TAKE 1 TABLET BY MOUTH DAILY AS NEEDED FOR MODERATE OR SEVERE PAIN.   No facility-administered encounter medications on file as of 10/14/2019.      Observations/Objective:   Assessment and Plan:  1.  Controlled type 2 diabetes mellitus with complication, without long-term current use of insulin ([employer]) Encourage her to continue healthy eating habits and try to move as much as she can.  2. Essential hypertension Controlled.  Continue current medications and low-salt diet  3. Class 3 severe obesity due to excess calories with serious comorbidity and body mass index (BMI) of 45.0 to 49.9 in adult [facility]) See #1 above  4. Chronic bilateral low back pain without sciatica - methocarbamol (ROBAXIN) 500 MG tablet; TAKE TWO TABLETS BY MOUTH EVERY 8 HOURS AS NEEDED FOR MUSCLE SPASMS  Dispense: 90 tablet; Refill: 0  5. Acute pain of both knees Patient will continue to ice the knee.  She has pain medication at home.  Encouraged her to remove things off the floor that [name] put her at danger of falling.  Reminded her that she is on a blood thinner and can bleed if she ever falls and hits her head.  Follow Up Instructions: 4 mths   I discussed the assessment and treatment plan with the patient. The patient was provided an opportunity to ask questions and all were answered. The patient agreed with the plan and demonstrated an understanding of the instructions.   The patient was advised to call back or seek an in-person evaluation if the symptoms worsen or if the condition fails to improve as anticipated.  I provided 12 minutes of non-face-to-face time during this encounter.   [name], MD

## 2019-10-14 NOTE — Patient Instructions (Signed)

## 2019-10-25 ENCOUNTER — Other Ambulatory Visit: Payer: Self-pay | Admitting: Internal Medicine

## 2019-10-26 DIAGNOSIS — Z1159 Encounter for screening for other viral diseases: Secondary | ICD-10-CM | POA: Diagnosis not present

## 2019-10-27 DIAGNOSIS — E890 Postprocedural hypothyroidism: Secondary | ICD-10-CM | POA: Diagnosis not present

## 2019-10-29 ENCOUNTER — Encounter: Payer: Self-pay | Admitting: Internal Medicine

## 2019-10-29 DIAGNOSIS — K219 Gastro-esophageal reflux disease without esophagitis: Secondary | ICD-10-CM | POA: Diagnosis not present

## 2019-10-29 DIAGNOSIS — D123 Benign neoplasm of transverse colon: Secondary | ICD-10-CM | POA: Diagnosis not present

## 2019-10-29 DIAGNOSIS — K5730 Diverticulosis of large intestine without perforation or abscess without bleeding: Secondary | ICD-10-CM | POA: Diagnosis not present

## 2019-10-29 DIAGNOSIS — K621 Rectal polyp: Secondary | ICD-10-CM | POA: Diagnosis not present

## 2019-10-29 DIAGNOSIS — R1310 Dysphagia, unspecified: Secondary | ICD-10-CM | POA: Diagnosis not present

## 2019-10-29 DIAGNOSIS — Z1211 Encounter for screening for malignant neoplasm of colon: Secondary | ICD-10-CM | POA: Diagnosis not present

## 2019-10-29 DIAGNOSIS — K2930 Chronic superficial gastritis without bleeding: Secondary | ICD-10-CM | POA: Diagnosis not present

## 2019-10-29 DIAGNOSIS — K3189 Other diseases of stomach and duodenum: Secondary | ICD-10-CM | POA: Diagnosis not present

## 2019-10-29 LAB — HM COLONOSCOPY

## 2019-11-01 DIAGNOSIS — E1165 Type 2 diabetes mellitus with hyperglycemia: Secondary | ICD-10-CM | POA: Diagnosis not present

## 2019-11-17 ENCOUNTER — Telehealth: Payer: Self-pay | Admitting: Internal Medicine

## 2019-11-17 NOTE — Telephone Encounter (Signed)
Receive note of endoscopy done with [facility] on 10/29/2019.  Patient was also supposed to have a colonoscopy but I do not see the colonoscopy report.  However the gastroenterologist wrote on the pathology report that was sent to me that she needs repeat colonoscopy in 5 years.  We will have my CMA call to verify that she had the colonoscopy and if so get a copy for our records.

## 2019-11-23 NOTE — Telephone Encounter (Signed)
Received colonoscopy report 11/24/19

## 2019-11-24 ENCOUNTER — Encounter: Payer: Self-pay | Admitting: Internal Medicine

## 2019-11-24 NOTE — Progress Notes (Signed)
I received colonoscopy report from [facility].  Patient had colonoscopy 10/29/2019.  She had to polyps removed the largest of which was 5 mm.  Positive sigmoid diverticulosis.  Repeat colonoscopy recommended in 5 years as one of the polyps was a tubular adenoma.

## 2019-11-25 ENCOUNTER — Other Ambulatory Visit: Payer: Self-pay | Admitting: Internal Medicine

## 2019-11-25 DIAGNOSIS — Z794 Long term (current) use of insulin: Secondary | ICD-10-CM

## 2019-11-25 DIAGNOSIS — I10 Essential (primary) hypertension: Secondary | ICD-10-CM

## 2019-12-01 DIAGNOSIS — E1165 Type 2 diabetes mellitus with hyperglycemia: Secondary | ICD-10-CM | POA: Diagnosis not present

## 2019-12-10 ENCOUNTER — Other Ambulatory Visit: Payer: Self-pay

## 2019-12-10 ENCOUNTER — Ambulatory Visit: Payer: Medicaid Other | Admitting: Pediatrics

## 2019-12-10 VITALS — BP 119/65 | Ht 69.75 in | Wt 320.0 lb

## 2019-12-10 DIAGNOSIS — M25511 Pain in right shoulder: Secondary | ICD-10-CM

## 2019-12-10 DIAGNOSIS — M5412 Radiculopathy, cervical region: Secondary | ICD-10-CM

## 2019-12-10 MED ORDER — METHOCARBAMOL 500 MG PO TABS: 500.0000 mg | ORAL_TABLET | Freq: Three times a day (TID) | ORAL | 0 refills | Status: DC | PRN

## 2019-12-10 MED ORDER — TRAMADOL HCL 50 MG PO TABS: ORAL_TABLET | ORAL | 1 refills | Status: DC

## 2019-12-10 NOTE — Progress Notes (Addendum)
Melinda [name] - 54 y.o. female MRN [id number]  Date of birth: 03-Jul-1965  SUBJECTIVE:   CC: Right shoulder pain  54 year old female presenting with acute right shoulder pain for the past 2 weeks after falling out of bed.  She reports that she was rolling out of bed and went to catch herself with her right arm but fell right onto her right shoulder.  Since this time, she has had severe pain in the right shoulder has been unable to move it without pain.  She feels like this shoulder is weaker as well.  Is also had occasional numbness and tingling down to her hand pain in her neck.  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 She reports that she has a brain aneurysm, COPD, and heart disease.-- reviewed PMHx, PSHx, and medications.  Past Medical History:  Diagnosis Date  . ADHD (attention deficit hyperactivity disorder)   . Arthritis   . Asthma   . Cancer ([place])   . Chicken pox AGE 54  . COPD (chronic obstructive pulmonary disease) ([place])    pt reported  . Diabetes mellitus without complication ([place])   . Diverticulosis, sigmoid   . GERD (gastroesophageal reflux disease)   . Glaucoma    BOTH EYES, NO EYE DROPS  . Glaucoma   . History of blood transfusion MARCH 2016    2UNITS GIVEN AND IRON GIVEN  . Hyperglycemia 09/09/2014  . Hypertension   . 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 Iron deficiency anemia due to chronic blood loss 09/09/2014  . Low TSH level 09/08/2014  . Lumbar herniated disc   . Menorrhagia 09/08/2014  . Migraine    CLUSTER AND MIGRAINES  . Multiple thyroid nodules   . Ovarian mass, right 09/09/2014  . PE (pulmonary embolism) 2016  . Peripheral neuropathy    FINGER TIPS AND TOES NUMB SOME  . Preeclampsia  1983   WITH PREGNANCY  . PTSD (post-traumatic stress disorder)   . Scoliosis   . Seizures ([place]) AGE 40   NONE SINCE, HAD CHICKEN POX  THEN   Past Surgical History:  Procedure Laterality Date  . ANGIOGRAM TO LEG  08-13-15   RIGHT  . CHOLECYSTECTOMY    . IR GENERIC HISTORICAL  04/04/2016   IR RADIOLOGIST EVAL & MGMT 04/04/2016 [name], MD [facility] INTERV RAD  . THYROIDECTOMY N/A 11/17/2015   Procedure: TOTAL THYROIDECTOMY;  Surgeon: Armandina Gemma, MD;  Location: [facility];  Service: General;  Laterality: N/A;  . UTERINE ARTERY EMBOLIZATION Bilateral 08/13/2015     Current Outpatient Medications on File Prior to Visit  Medication Sig Dispense Refill  . ACCU-CHEK AVIVA PLUS test strip USE TO TEST BLOOD GLUCOSE FOUR TIMES DAILY BEFORE MEALS AND AT BEDTIME 100 each 12  . ACCU-CHEK SOFTCLIX LANCETS lancets   3  . albuterol (PROVENTIL) (2.5 MG/3ML) 0.083% nebulizer solution TAKE 3 MLS (2.5 MG TOTAL) BY NEBULIZATION EVERY 6 HOURS AS NEEDED FORWHEEZING OR SHORTNESS OF BREATH (Patient taking differently: Take 2.5 mg by nebulization every 6 (six) hours as needed for wheezing or shortness of breath. ) 150 mL 1  . atorvastatin (LIPITOR) 20 MG tablet TAKE ONE TABLET BY MOUTH ONCE DAILY (BEDTIME) 30 tablet 2  . budesonide-formoterol (SYMBICORT) 160-4.5 MCG/ACT inhaler Inhale 2 puffs into the lungs 2 (two) times daily. 2 Inhaler 0  . buPROPion [facility]  XL) 300 MG 24 hr tablet Take 300 mg by mouth daily.   2  . busPIRone (BUSPAR) 5 MG tablet Take 5 mg by mouth 3 (three) times daily.    . citalopram (CELEXA) 40 MG tablet Take 40 mg by mouth daily.   2  . doxepin (SINEQUAN) 10 MG capsule Take 10 mg by mouth.    Arne Cleveland 5 MG TABS tablet TAKE ONE TABLET BY MOUTH TWICE DAILY (AM+PM) 60 tablet 2  . fluticasone (CUTIVATE) 0.05 % cream Apply topically 2 (two) times daily. 30 g 2  . gabapentin (NEURONTIN) 100 MG capsule TAKE ONE CAPSULE BY MOUTH THREE TIMES DAILY (AM+N+PM) 90 capsule 5  . hydrochlorothiazide (MICROZIDE) 12.5 MG capsule TAKE ONE CAPSULE BY MOUTH ONCE DAILY(AM) 30 capsule 2  . loratadine (CLARITIN) 10 MG tablet Take 1 tablet (10 mg  total) by mouth daily. 30 tablet 11  . metoprolol tartrate (LOPRESSOR) 50 MG tablet TAKE ONE TABLET BY MOUTH TWICE DAILY (AM+PM) 60 tablet 2  . montelukast (SINGULAIR) 10 MG tablet TAKE 1 TABLET BY MOUTH EVERY EVENING AT BEDTIME 30 tablet 2  . PAZEO 0.7 % SOLN     . prazosin (MINIPRESS) 1 MG capsule Take 5 mg by mouth at bedtime.     Marland Kitchen SYNTHROID 200 MCG tablet 212 mcg.     . topiramate (TOPAMAX) 200 MG tablet Take 1 tablet (200 mg total) by mouth at bedtime. 90 tablet 3   No current facility-administered medications on file prior to visit.   DATA REVIEWED: Prior records  PHYSICAL EXAM:  VS: BP:119/65  HR: bpm  TEMP: ( )  RESP:   HT:5' 9.75" (177.2 cm)   WT:(!) 320 lb (145.2 kg)  BMI:46.23 PHYSICAL EXAM: Gen: NAD, alert, cooperative with exam, well-appearing HEENT: clear conjunctiva,  CV:  no edema, capillary refill brisk, normal rate Resp: non-labored Skin: no rashes, normal turgor  Neuro: no gross deficits.  Psych:  alert and oriented  Right Shoulder: Inspection reveals no obvious deformity, atrophy, or asymmetry. No bruising. No swelling TTP over anterior, lateral, and posterior shoulder. Very tight lateral trapezius muscles and tight cervical trapezius attachment Flexion and abduction limited to 80 degrees, ER limited to 30 degrees, IR to side (not behind back) NV intact distally Normal scapular function observed. Special Tests:  - Impingement: positive Hawkins, neers, empty can sign. - Supraspinatous: positive empty can.  4/5 strength with resisted flexion at 20 degrees - Infraspinatous/Teres Minor: 4/5 strength with ER - Subscapularis: 4/5 strength with IR - Biceps tendon: positive Speeds, negative Yerrgason's   Neck: Full ROM without pain Negative spurlings  But during ultrasound she had numbness and tingling into 4th and 5th fingers  ULTRASOUND: Shoulder, right Diagnostic limited ultrasound imaging obtained of patient's right shoulder.  - No obvious evidence  of bony deformity or osteophyte development appreciated.  - Long head of the biceps tendon: No evidence of tendon thickening or tearing in short or long axis views. Bullseye sign with edema. - Subscapularis tendon: poor visualization with hypoechoic changes throughout tendon. Possibly torn but very poor view due to pain being unable to externally rotate arm. - Supraspinatus tendon: complete visualization across the width of the insertion point yielded partial tear at anterior footplate with bursal inflammation - Infraspinatus and teres minor tendons: visualization across the width of the insertion points yielded no evidence of tendon thickening, calcification, or tears in the long axis view.   IMPRESSION: findings consistent with partial supraspinatus tear, general swelling in anterior shoulder with  edema in biceps tendon, poorly visualized subscapularis with concern for tear   ASSESSMENT & PLAN:   54 year old female presenting with acute right shoulder pain after an injury where she fell onto her right shoulder out of bed.  On exam she has weakness with rotator cuff testing in all directions, pain with movement of arm, tightness in her trapezius muscle up to her neck.  Ultrasound findings concerning for possible rotator cuff tear, with swelling in her biceps tendon, swelling around subscapularis with poor visualization of the tendon, and partial thickness tear of her supraspinatus.  Will obtain an MRI to better evaluate for pathology.  In the interim, refilled her tramadol and Robaxin.  We will also send to physical therapy to work on cervical spine as I believe this is a separate issue that is causing her radicular symptoms into her hand.  Will call with results of MRI and next steps.  I was the preceptor for this visit and available for immediate consultation Shellia Cleverly, DO

## 2019-12-16 ENCOUNTER — Ambulatory Visit
Admission: RE | Admit: 2019-12-16 | Discharge: 2019-12-16 | Disposition: A | Discharge status: Home / Self Care | Payer: Medicaid Other | Source: Ambulatory Visit | Attending: Sports Medicine | Admitting: Sports Medicine

## 2019-12-16 DIAGNOSIS — M25511 Pain in right shoulder: Secondary | ICD-10-CM

## 2019-12-21 ENCOUNTER — Other Ambulatory Visit: Payer: Self-pay

## 2019-12-21 ENCOUNTER — Ambulatory Visit: Payer: Medicaid Other | Attending: Sports Medicine

## 2019-12-21 DIAGNOSIS — M5412 Radiculopathy, cervical region: Secondary | ICD-10-CM | POA: Diagnosis not present

## 2019-12-21 DIAGNOSIS — M436 Torticollis: Secondary | ICD-10-CM | POA: Diagnosis not present

## 2019-12-21 DIAGNOSIS — R293 Abnormal posture: Secondary | ICD-10-CM

## 2019-12-21 DIAGNOSIS — R252 Cramp and spasm: Secondary | ICD-10-CM | POA: Insufficient documentation

## 2019-12-21 NOTE — Therapy (Signed)
[facility] [place], [place], [address] Phone: [phone]   Fax:  [phone]  Physical Therapy Evaluation  Patient Details  Name: Melinda [name] MRN: [id number] Date of Birth: 22-Nov-1965 Referring Provider (PT): Lilia Argue, DO   Encounter Date: 12/21/2019   PT End of Session - 12/21/19 1121    Visit Number 1    Number of Visits 12    Date for PT Re-Evaluation 02/18/20    Authorization Type MCD    PT Start Time 1120    PT Stop Time 1200    PT Time Calculation (min) 40 min    Activity Tolerance Patient tolerated treatment well;Patient limited by pain    Behavior During Therapy [facility] for tasks assessed/performed           Past Medical History:  Diagnosis Date  . ADHD (attention deficit hyperactivity disorder)   . Arthritis   . Asthma   . Cancer ([place])   . Chicken pox AGE 73  . COPD (chronic obstructive pulmonary disease) ([place])    pt reported  . Diabetes mellitus without complication ([place])   . Diverticulosis, sigmoid   . GERD (gastroesophageal reflux disease)   . Glaucoma    BOTH EYES, NO EYE DROPS  . Glaucoma   . History of blood transfusion MARCH 2016    2UNITS GIVEN AND IRON GIVEN  . Hyperglycemia 09/09/2014  . Hypertension   . 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 Iron deficiency anemia due to chronic blood loss 09/09/2014  . Low TSH level 09/08/2014  . Lumbar herniated disc   . Menorrhagia 09/08/2014  . Migraine    CLUSTER AND MIGRAINES  . Multiple thyroid nodules   . Ovarian mass, right 09/09/2014  . PE (pulmonary embolism) 2016  . Peripheral neuropathy    FINGER TIPS AND TOES NUMB SOME  . Preeclampsia  1983   WITH PREGNANCY  . PTSD (post-traumatic stress disorder)   . Scoliosis   . Seizures ([place]) AGE 89   NONE SINCE, HAD CHICKEN POX THEN    Past Surgical History:  Procedure Laterality Date  . ANGIOGRAM TO LEG   08-13-15   RIGHT  . CHOLECYSTECTOMY    . IR GENERIC HISTORICAL  04/04/2016   IR RADIOLOGIST EVAL & MGMT 04/04/2016 [name], MD [facility] INTERV RAD  . THYROIDECTOMY N/A 11/17/2015   Procedure: TOTAL THYROIDECTOMY;  Surgeon: Armandina Gemma, MD;  Location: [facility];  Service: General;  Laterality: N/A;  . UTERINE ARTERY EMBOLIZATION Bilateral 08/13/2015    There were no vitals filed for this visit.    Subjective Assessment - 12/21/19 1126    Subjective She reports neck pain.   She also reports falls.  She report MRI on shoulder RT for possible RTC tear.     She reports possible pinched nerve due to shooting pain into Rt arm.  Neck pain started 5 days ago.   She was here earlier this year  for RTC . [place] out of bed recently and excerbated RT shoulder pain;   She can't lift her arm due to pain.    Pertinent History RT RTC pain. , balance issues , LBP thyroid surgery    Limitations --   writing with RT hand.   Diagnostic tests Xrays in past OA    Patient Stated Goals She wants to strengthen neck muscles    Currently in Pain? Yes  Pain Score 6     Pain Location Neck    Pain Orientation Right;Lateral    Pain Descriptors / Indicators Aching    Pain Type Chronic pain    Pain Radiating Towards RT hand    Pain Onset More than a month ago    Pain Frequency Constant    Aggravating Factors  Sitting up for 2-3 hours    Pain Relieving Factors lye, holding head              [facility] PT Assessment - 12/21/19 0001      Assessment   Medical Diagnosis cervical radiculopathy    Referring Provider (PT) Lilia Argue, DO    Onset Date/Surgical Date --   shooting pains  weeks ago         neck pain 5 days   Prior Therapy Not for neck      Precautions   Precautions None      Restrictions   Weight Bearing Restrictions No      Balance Screen   Has the patient fallen in the past 6 months Yes    How many times? 1   fell out of bed   Has the patient had a decrease in activity level because of a fear of  falling?  Yes   not related to  neck   Is the patient reluctant to leave their home because of a fear of falling?  No      Home Environment   Living Environment Private residence    [place] One level    [place] - 4 wheels;Cane - single point;Grab bars - tub/shower;Grab bars - toilet;Shower seat      Prior Function   Level of Independence Requires assistive device for independence      Cognition   Overall Cognitive Status Within Functional Limits for tasks assessed      ROM / Strength   AROM / PROM / Strength AROM;Strength      AROM   AROM Assessment Site Cervical    Cervical Flexion 30    Cervical Extension 50    Cervical - Right Side Bend 20    Cervical - Left Side Bend 20    Cervical - Right Rotation 0    Cervical - Left Rotation 50                      Objective measurements completed on examination: See above findings.               PT Education - 12/21/19 1218    Education Details POC HEP,, need to get scapula mobility without lifting RT arm    Person(s) Educated Patient    Methods Explanation;Demonstration;Tactile cues;Verbal cues;Handout    Comprehension Verbalized understanding;Returned demonstration            PT Short Term Goals - 12/21/19 1211      PT SHORT TERM GOAL #1   Title independent with HEP    Baseline No ne ck program    Time 3    Period Weeks    Status New      PT SHORT TERM GOAL #2   Title She will improve cervical side bend to 25 degrees or more    Baseline 20 degrees RT/LT    Time 3    Period Weeks    Status New      PT  SHORT TERM GOAL #3   Title She will demo understanding of good cervical posture to decr stress to neck    Baseline foreward head    Time 3    Period Weeks    Status New             PT Long Term Goals - 12/21/19 1213      PT LONG TERM GOAL #1   Title She will improve cervical rotation to 60 degrees to decr  pain with rotation    Baseline 50 degrees    Time 6    Period Weeks    Status New      PT LONG TERM GOAL #2   Title She will report pain in neck decreased 75% or more to 2/10 max and intermittant    Baseline 6/10 today    Time 6    Period Weeks    Status New      PT LONG TERM GOAL #3   Title She will report shooting pain in RT arm decr  50% or more    Baseline reports shooting pain into RT arm to RT hand    Time 6    Period Weeks    Status New                  Plan - 12/21/19 1138    Clinical Impression Statement Ms Knowlton presents with multiple areas of complaint including her neck that has some ROM limits and some pain with end range cervical motion. She is having significant RT shoulder pain and will have MRI assessment in future. She is not able to raise her Rt arm to shoulder height.   She reported dizziness with cervical extension so we will try to avoid this.  She related shooting pain to RT hand but did not report this with any cervical motion.     She has a forward head posture.   We will work on [employer] and trenght along with ROM  and modalities as beneficial  She should make some gains with skilled PT but may e limited.    Personal Factors and Comorbidities Fitness;Past/Current Experience;Time since onset of injury/illness/exacerbation;Comorbidity 1;Comorbidity 3+;Comorbidity 2    Comorbidities obesity , sue of walker for mobility.    Examination-Activity Limitations --   many limitations from other issues but neck soes not limit significantly   Examination-Participation Restrictions --   Minimal neck restrictions but other issues create significant mobiltiy resitrictions   Stability/Clinical Decision Making Stable/Uncomplicated    Clinical Decision Making Low    Rehab Potential Fair    PT Frequency --   3 visit   PT Duration --   for authorized period   Then 2x/week for 3 weeks  and assess improvement then   PT Treatment/Interventions Electrical  Stimulation;Iontophoresis 4mg /ml Dexamethasone;Moist Heat;Therapeutic exercise;Patient/family education;Manual techniques;Dry needling;Passive range of motion;Taping;Ultrasound    PT Next Visit Plan REveiw HEP , neck isometrics,  modalities and manual as tolerated to neck and shoulder .  Avoid AROM RT shoulder and heavier pressure to RT deltoid    PT Home Exercise Plan cervical retraction scapula rotation,  cervical side bend and rotation    Consulted and Agree with Plan of Care Patient           Patient will benefit from skilled therapeutic intervention in order to improve the following deficits and impairments:  Pain, Postural dysfunction, Decreased activity tolerance, Decreased range of motion, Decreased strength, Dizziness  Visit Diagnosis: Radiculopathy, cervical region  Cramp and spasm  Stiffness of neck  Abnormal posture     Problem List Patient Active Problem List   Diagnosis Date Noted  . Adrenal mass, right ([place]) 04/29/2019  . Chronic cough 03/12/2018  . Left shoulder pain 10/14/2016  . Hypertension 10/14/2016  . S/P thyroidectomy 06/27/2016  . Chronic bilateral low back pain without sciatica 04/22/2016  . Liver lesion 12/24/2015  . Papillary thyroid carcinoma ([place]) 12/21/2015  . Controlled type 2 diabetes mellitus with complication, with long-term current use of insulin ([place]) 10/10/2015  . Vision loss 08/25/2015  . Decreased vision in both eyes 08/25/2015  . Prolonged Q-T interval on ECG 08/19/2015  . History of pulmonary embolism 08/10/2015  . Mild persistent asthma 04/13/2015  . Fibroid, uterine   . Morbid obesity ([place]) 11/11/2014  . Sinus tachycardia 11/08/2014  . Migraine 10/14/2014  . Menorrhagia 09/08/2014    Darrel Hoover  PT 12/21/2019, 12:18 PM  [facility] [address] [place], [place], [address] Phone: [phone]   Fax:  [phone]  Name: Melinda [name] MRN: [id number] Date of Birth:  1965/07/30

## 2019-12-21 NOTE — Patient Instructions (Signed)
Cervical retraction/side bend/ rotation RT/LT x 2-3 reps  5 sec hold 4-6x/day Scapula rolls without going forward x 5 resp 4x/day

## 2019-12-23 ENCOUNTER — Other Ambulatory Visit: Payer: Self-pay | Admitting: Internal Medicine

## 2019-12-30 ENCOUNTER — Emergency Department (HOSPITAL_COMMUNITY)
Admission: EM | Admit: 2019-12-30 | Discharge: 2019-12-31 | Disposition: A | Discharge status: Home / Self Care | Payer: Medicaid Other | Attending: Emergency Medicine | Admitting: Emergency Medicine

## 2019-12-30 ENCOUNTER — Emergency Department (HOSPITAL_COMMUNITY): Payer: Medicaid Other

## 2019-12-30 ENCOUNTER — Encounter (HOSPITAL_COMMUNITY): Payer: Self-pay | Admitting: Emergency Medicine

## 2019-12-30 ENCOUNTER — Other Ambulatory Visit: Payer: Self-pay

## 2019-12-30 DIAGNOSIS — Z7901 Long term (current) use of anticoagulants: Secondary | ICD-10-CM | POA: Diagnosis not present

## 2019-12-30 DIAGNOSIS — M79621 Pain in right upper arm: Secondary | ICD-10-CM | POA: Diagnosis not present

## 2019-12-30 DIAGNOSIS — Z86711 Personal history of pulmonary embolism: Secondary | ICD-10-CM | POA: Diagnosis not present

## 2019-12-30 DIAGNOSIS — I10 Essential (primary) hypertension: Secondary | ICD-10-CM | POA: Diagnosis not present

## 2019-12-30 DIAGNOSIS — R0789 Other chest pain: Secondary | ICD-10-CM | POA: Diagnosis not present

## 2019-12-30 DIAGNOSIS — E119 Type 2 diabetes mellitus without complications: Secondary | ICD-10-CM | POA: Diagnosis not present

## 2019-12-30 DIAGNOSIS — Z79899 Other long term (current) drug therapy: Secondary | ICD-10-CM | POA: Diagnosis not present

## 2019-12-30 DIAGNOSIS — S4991XA Unspecified injury of right shoulder and upper arm, initial encounter: Secondary | ICD-10-CM | POA: Diagnosis not present

## 2019-12-30 DIAGNOSIS — Z9889 Other specified postprocedural states: Secondary | ICD-10-CM | POA: Diagnosis not present

## 2019-12-30 DIAGNOSIS — R079 Chest pain, unspecified: Secondary | ICD-10-CM | POA: Diagnosis not present

## 2019-12-30 DIAGNOSIS — S6991XA Unspecified injury of right wrist, hand and finger(s), initial encounter: Secondary | ICD-10-CM | POA: Diagnosis not present

## 2019-12-30 DIAGNOSIS — I700 Atherosclerosis of aorta: Secondary | ICD-10-CM | POA: Diagnosis not present

## 2019-12-30 DIAGNOSIS — J439 Emphysema, unspecified: Secondary | ICD-10-CM | POA: Diagnosis not present

## 2019-12-30 LAB — TROPONIN I (HIGH SENSITIVITY)
Troponin I (High Sensitivity): 4 ng/L (ref <18)
Troponin I (High Sensitivity): 6 ng/L (ref <18)

## 2019-12-30 LAB — CBC
HCT: 44 % (ref 36.0–46.0)
Hemoglobin: 14.2 g/dL (ref 12.0–15.0)
MCH: 29.2 pg (ref 26.0–34.0)
MCHC: 32.3 g/dL (ref 30.0–36.0)
MCV: 90.5 fL (ref 80.0–100.0)
Platelets: 282 10*3/uL (ref 150–400)
RBC: 4.86 MIL/uL (ref 3.87–5.11)
RDW: 13.5 % (ref 11.5–15.5)
WBC: 10.1 10*3/uL (ref 4.0–10.5)
nRBC: 0 % (ref 0.0–0.2)

## 2019-12-30 LAB — BASIC METABOLIC PANEL
Anion gap: 8 (ref 5–15)
BUN: 10 mg/dL (ref 6–20)
CO2: 20 mmol/L — ABNORMAL LOW (ref 22–32)
Calcium: 8.3 mg/dL — ABNORMAL LOW (ref 8.9–10.3)
Chloride: 112 mmol/L — ABNORMAL HIGH (ref 98–111)
Creatinine, Ser: 0.85 mg/dL (ref 0.44–1.00)
GFR calc Af Amer: >60 mL/min (ref >60)
GFR calc non Af Amer: >60 mL/min (ref >60)
Glucose, Bld: 128 mg/dL — ABNORMAL HIGH (ref 70–99)
Potassium: 3.4 mmol/L — ABNORMAL LOW (ref 3.5–5.1)
Sodium: 140 mmol/L (ref 135–145)

## 2019-12-30 LAB — I-STAT BETA HCG BLOOD, ED (MC, WL, AP ONLY): I-stat hCG, quantitative: <5 m[IU]/mL (ref <5)

## 2019-12-30 MED ORDER — ONDANSETRON HCL 4 MG/2ML IJ SOLN
4.0000 mg | Freq: Once | INTRAMUSCULAR | Status: AC
  Administered 2019-12-31: 4 mg via INTRAVENOUS
  Filled 2019-12-30: qty 2

## 2019-12-30 MED ORDER — SODIUM CHLORIDE 0.9% FLUSH
3.0000 mL | Freq: Once | INTRAVENOUS | Status: AC
  Administered 2019-12-30: 3 mL via INTRAVENOUS

## 2019-12-30 MED ORDER — MORPHINE SULFATE (PF) 4 MG/ML IV SOLN
4.0000 mg | Freq: Once | INTRAVENOUS | Status: AC
  Administered 2019-12-30: 4 mg via INTRAVENOUS
  Filled 2019-12-30: qty 1

## 2019-12-30 MED ORDER — IOHEXOL 350 MG/ML SOLN
75.0000 mL | Freq: Once | INTRAVENOUS | Status: AC | PRN
  Administered 2019-12-30: 75 mL via INTRAVENOUS

## 2019-12-30 NOTE — ED Provider Notes (Signed)
TIME SEEN: 9:24 PM  CHIEF COMPLAINT: Right-sided chest pain  HPI: Patient is a 54 year old female with history of of PE on Eliquis, COPD, obesity, hypertension, diabetes who presents to the emergency department with right-sided chest pain today that she describes as sharp in nature. States it feels similar to her previous PE but reports compliance with Eliquis although she missed her dose tonight. She denies shortness of breath, fevers, cough, lower extremity swelling or pain. Has had some nausea. No diaphoresis or dizziness. No alleviating factors. States pain is worse whenever she lies on her right side. She states that it [name] have been from a recent fall out of bed 2 weeks ago. She is requesting x-rays of her right upper extremity.  States she did not hit her head.  ROS: See HPI Constitutional: no fever  Eyes: no drainage  ENT: no runny nose   Cardiovascular:   chest pain  Resp: no SOB  GI: no vomiting GU: no dysuria Integumentary: no rash  Allergy: no hives  Musculoskeletal: no leg swelling  Neurological: no slurred speech ROS otherwise negative  PAST MEDICAL HISTORY/PAST SURGICAL HISTORY:  Past Medical History:  Diagnosis Date  . ADHD (attention deficit hyperactivity disorder)   . Arthritis   . Asthma   . Cancer ([place])   . Chicken pox AGE 33  . COPD (chronic obstructive pulmonary disease) ([place])    pt reported  . Diabetes mellitus without complication ([place])   . Diverticulosis, sigmoid   . GERD (gastroesophageal reflux disease)   . Glaucoma    BOTH EYES, NO EYE DROPS  . Glaucoma   . History of blood transfusion MARCH 2016    2UNITS GIVEN AND IRON GIVEN  . Hyperglycemia 09/09/2014  . Hypertension   . 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 Iron deficiency anemia due to chronic blood loss 09/09/2014  . Low TSH level 09/08/2014  . Lumbar herniated disc   . Menorrhagia 09/08/2014  . Migraine    CLUSTER AND  MIGRAINES  . Multiple thyroid nodules   . Ovarian mass, right 09/09/2014  . PE (pulmonary embolism) 2016  . Peripheral neuropathy    FINGER TIPS AND TOES NUMB SOME  . Preeclampsia  1983   WITH PREGNANCY  . PTSD (post-traumatic stress disorder)   . Scoliosis   . Seizures ([facility]) AGE 33   NONE SINCE, HAD CHICKEN POX THEN    MEDICATIONS:  Prior to Admission medications   Medication Sig Start Date End Date Taking? Authorizing Provider  ACCU-CHEK AVIVA PLUS test strip USE TO TEST BLOOD GLUCOSE FOUR TIMES DAILY BEFORE MEALS AND AT BEDTIME Patient not taking: Reported on 12/21/2019 11/06/15   Boykin Nearing, MD  ACCU-CHEK SOFTCLIX LANCETS lancets  10/20/17   [provider]  albuterol (PROVENTIL) (2.5 MG/3ML) 0.083% nebulizer solution TAKE 3 MLS (2.5 MG TOTAL) BY NEBULIZATION EVERY 6 HOURS AS NEEDED FORWHEEZING OR SHORTNESS OF BREATH Patient taking differently: Take 2.5 mg by nebulization every 6 (six) hours as needed for wheezing or shortness of breath.  05/13/17   [name], MD  atorvastatin (LIPITOR) 20 MG tablet TAKE ONE TABLET BY MOUTH ONCE DAILY (BEDTIME) 11/26/19   [name], MD  budesonide-formoterol [facility]) 160-4.5 MCG/ACT inhaler Inhale 2 puffs into the lungs 2 (two) times daily. 03/12/18   Collene Gobble, MD  buPROPion (WELLBUTRIN XL) 300 MG 24 hr tablet Take 300 mg by mouth daily.  09/12/17   [provider]  busPIRone (BUSPAR) 5 MG tablet Take 5 mg by mouth 3 (three) times daily.    [provider]  citalopram (CELEXA) 40 MG tablet Take 40 mg by mouth daily.  12/01/17   [provider]  doxepin (SINEQUAN) 10 MG capsule Take 10 mg by mouth.    [provider]  ELIQUIS 5 MG TABS tablet TAKE ONE TABLET BY MOUTH TWICE DAILY (AM+PM) 12/24/19   [name], MD  fluticasone (CUTIVATE) 0.05 % cream Apply topically 2 (two) times daily. 05/13/16   Funches, Adriana Mccallum, MD  gabapentin (NEURONTIN) 100 MG capsule TAKE ONE CAPSULE BY  MOUTH THREE TIMES DAILY (AM+N+PM) 08/18/19   [name], MD  hydrochlorothiazide (MICROZIDE) 12.5 MG capsule TAKE ONE CAPSULE BY MOUTH ONCE DAILY(AM) 11/26/19   [name], MD  loratadine (CLARITIN) 10 MG tablet Take 1 tablet (10 mg total) by mouth daily. 10/31/17   [name], MD  methocarbamol (ROBAXIN) 500 MG tablet Take 1 tablet (500 mg total) by mouth every 8 (eight) hours as needed for muscle spasms. 12/10/19   Lilia Argue R, DO  metoprolol tartrate (LOPRESSOR) 50 MG tablet TAKE ONE TABLET BY MOUTH TWICE DAILY (AM+PM) 10/26/19   [name], MD  montelukast (SINGULAIR) 10 MG tablet TAKE 1 TABLET BY MOUTH EVERY EVENING AT BEDTIME 12/01/18   [name], MD  PAZEO 0.7 % SOLN  08/25/18   [provider]  prazosin (MINIPRESS) 1 MG capsule Take 5 mg by mouth at bedtime.     [provider]  SYNTHROID 200 MCG tablet 212 mcg.  10/29/18   [provider]  topiramate (TOPAMAX) 200 MG tablet Take 1 tablet (200 mg total) by mouth at bedtime. 08/18/19   [name], DO  traMADol (ULTRAM) 50 MG tablet TAKE 1 TABLET BY MOUTH DAILY AS NEEDED FOR MODERATE OR SEVERE PAIN. 12/10/19   Thurman Coyer, DO    ALLERGIES:  Allergies  Allergen Reactions  . Ceftriaxone Anaphylaxis    ROCEPHIN  . Penicillins Shortness Of Breath    Has patient had a PCN reaction causing immediate rash, facial/tongue/throat swelling, SOB or lightheadedness with hypotension: Yes Has patient had a PCN reaction causing severe rash involving mucus membranes or skin necrosis: No Has patient had a PCN reaction that required hospitalization No Has patient had a PCN reaction occurring within the last 10 years: No If all of the above answers are "NO", then [name] proceed with Cephalosporin use.   . Shellfish Allergy Anaphylaxis  . Flonase [Fluticasone Propionate]     Makes migraines worse  . Gold-Containing Drug Products     HANDS ITCH  . Nickel     HANDS SWELL  . Citrus  Rash    SOCIAL HISTORY:  Social History   Tobacco Use  . Smoking status: Former Smoker    Packs/day: 0.50    Years: 40.00    Pack years: 20.00    Types: Cigarettes    Quit date: 11/03/2014    Years since quitting: 5.1  . Smokeless tobacco: Never Used  Substance Use Topics  . Alcohol use: No    Alcohol/week: 0.0 standard drinks    FAMILY HISTORY: Family History  Problem Relation Age of Onset  . Diabetes Mother   . Breast cancer Mother   . CAD Mother   . Hypertension Mother   . Alcohol abuse Father   . Hypertension Father   . Breast cancer Maternal Aunt   .  Breast cancer Maternal Aunt     EXAM: BP 123/90 (BP Location: Right Wrist)   Pulse 70   Temp 98.2 F (36.8 C) (Oral)   Resp 18   Ht 5\' 9"  (1.753 m)   Wt (!) 145 kg   SpO2 100%   BMI 47.21 kg/m  CONSTITUTIONAL: Alert and oriented and responds appropriately to questions. Well-appearing; well-nourished, obese HEAD: Normocephalic, appears atraumatic EYES: Conjunctivae clear, pupils appear equal, EOM appear intact ENT: normal nose; moist mucous membranes NECK: Supple, normal ROM CARD: RRR; S1 and S2 appreciated; no murmurs, no clicks, no rubs, no gallops CHEST:  Chest wall is nontender to palpation.  No crepitus, ecchymosis, erythema, warmth, rash or other lesions present.   RESP: Normal chest excursion without splinting or tachypnea; breath sounds clear and equal bilaterally; no wheezes, no rhonchi, no rales, no hypoxia or respiratory distress, speaking full sentences ABD/GI: Normal bowel sounds; non-distended; soft, non-tender, no rebound, no guarding, no peritoneal signs, no hepatosplenomegaly BACK:  The back appears normal EXT: Normal ROM in all joints; no deformity noted, no edema; no cyanosis, no calf tenderness or calf swelling, patient is tender to palpation over the right humerus and right wrist without deformity. Compartments in the right upper extremity are soft. 2+ right radial pulse on exam. Normal  sensation throughout the right upper extremity. SKIN: Normal color for age and race; warm; no rash on exposed skin NEURO: Moves all extremities equally PSYCH: The patient's mood and manner are appropriate.   MEDICAL DECISION MAKING: Patient here with atypical right-sided chest pain. Does have history of PE. We will proceed with CTA of the chest. She has had 2 high-sensitivity troponins that have been unremarkable here. Low suspicion for ACS, dissection. No infectious symptoms. Chest x-ray shows no pneumothorax or sign of volume overload. Will give morphine for pain control. She also reports recent fall out of bed but no rib fracture seen on x-ray. Asking for x-rays of the upper extremity. X-ray of the humerus and wrist pending. EKG shows no new ischemic abnormality.  ED PROGRESS: X-ray showed no acute abnormality.  CT of the chest shows no acute abnormality.  Seems to be musculoskeletal pain.  Patient's pain improved after morphine but now having nausea.  Will give Zofran and likely discharge home.  At this time, I do not feel there is any life-threatening condition present. I have reviewed, interpreted and discussed all results (EKG, imaging, lab, urine as appropriate) and exam findings with patient/family. I have reviewed nursing notes and appropriate previous records.  I feel the patient is safe to be discharged home without further emergent workup and can continue workup as an outpatient as needed. Discussed usual and customary return precautions. Patient/family verbalize understanding and are comfortable with this plan.  Outpatient follow-up has been provided as needed. All questions have been answered.     EKG Interpretation  Date/Time:  Thursday December 30 2019 15:21:10 EDT Ventricular Rate:  70 PR Interval:  156 QRS Duration: 76 QT Interval:  404 QTC Calculation: 436 R Axis:   11 Text Interpretation: Normal sinus rhythm Septal infarct , age undetermined Abnormal ECG Confirmed by Pryor Curia [phone]) on 12/30/2019 9:25:42 PM          Melinda [name] was evaluated in Emergency Department on 12/30/2019 for the symptoms described in the history of present illness. She was evaluated in the context of the global COVID-19 pandemic, which necessitated consideration that the patient might be at risk for infection with the SARS-CoV-2 virus  that causes COVID-19. Institutional protocols and algorithms that pertain to the evaluation of patients at risk for COVID-19 are in a state of rapid change based on information released by regulatory bodies including the CDC and federal and state organizations. These policies and algorithms were followed during the patient's care in the ED.      Melinda [name], Delice Bison, DO 12/30/19 2317

## 2019-12-30 NOTE — Discharge Instructions (Addendum)
Your cardiac labs today were reassuring.  CT scan showed no blood clot or other acute abnormality including rib fractures, pneumonia, fluid on your lungs.  X-rays of your arm showed no fracture.  Please continue your Eliquis as prescribed.  You may take over-the-counter Tylenol 1000 mg every 6 hours as needed for pain.

## 2019-12-30 NOTE — ED Triage Notes (Signed)
Pt arrives via EMS from home with reports of sharp CP at 1 pm while laying in bed. 324 ASA and 3 nitro given. Pain from 7/10 to 5/10. 18G R AC. Denies SOB

## 2019-12-31 ENCOUNTER — Encounter: Payer: Self-pay | Admitting: Rehabilitative and Restorative Service Providers"

## 2019-12-31 ENCOUNTER — Ambulatory Visit: Payer: Medicaid Other | Attending: Sports Medicine | Admitting: Rehabilitative and Restorative Service Providers"

## 2019-12-31 DIAGNOSIS — M6281 Muscle weakness (generalized): Secondary | ICD-10-CM | POA: Insufficient documentation

## 2019-12-31 DIAGNOSIS — R252 Cramp and spasm: Secondary | ICD-10-CM | POA: Diagnosis present

## 2019-12-31 DIAGNOSIS — R293 Abnormal posture: Secondary | ICD-10-CM | POA: Diagnosis present

## 2019-12-31 DIAGNOSIS — M25511 Pain in right shoulder: Secondary | ICD-10-CM

## 2019-12-31 DIAGNOSIS — G8929 Other chronic pain: Secondary | ICD-10-CM | POA: Diagnosis present

## 2019-12-31 DIAGNOSIS — M436 Torticollis: Secondary | ICD-10-CM | POA: Insufficient documentation

## 2019-12-31 DIAGNOSIS — M5412 Radiculopathy, cervical region: Secondary | ICD-10-CM | POA: Diagnosis present

## 2019-12-31 NOTE — Therapy (Addendum)
[place], [place], [address] Phone: [phone]   Fax:  [phone]  Physical Therapy Treatment/Discharge  Patient Details  Name: Melinda [name] MRN: [id number] Date of Birth: [birth year] Referring Provider (PT): Lilia Argue, DO   Encounter Date: 12/31/2019   PT End of Session - 12/31/19 1154    Visit Number 2    Number of Visits 12    Date for PT Re-Evaluation 02/18/20    Authorization Type MCD    PT Start Time 1135    PT Stop Time 1215    PT Time Calculation (min) 40 min    Activity Tolerance Patient tolerated treatment well;No increased pain    Behavior During Therapy WFL for tasks assessed/performed           Past Medical History:  Diagnosis Date  . ADHD (attention deficit hyperactivity disorder)   . Arthritis   . Asthma   . Cancer ([place])   . Chicken pox AGE 54  . COPD (chronic obstructive pulmonary disease) ([place])    pt reported  . Diabetes mellitus without complication ([place])   . Diverticulosis, sigmoid   . GERD (gastroesophageal reflux disease)   . Glaucoma    BOTH EYES, NO EYE DROPS  . Glaucoma   . History of blood transfusion MARCH 2016    2UNITS GIVEN AND IRON GIVEN  . Hyperglycemia 09/09/2014  . Hypertension   . 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 Iron deficiency anemia due to chronic blood loss 09/09/2014  . Low TSH level 09/08/2014  . Lumbar herniated disc   . Menorrhagia 09/08/2014  . Migraine    CLUSTER AND MIGRAINES  . Multiple thyroid nodules   . Ovarian mass, right 09/09/2014  . PE (pulmonary embolism) 2016  . Peripheral neuropathy    FINGER TIPS AND TOES NUMB SOME  . Preeclampsia  1983   WITH PREGNANCY  . PTSD (post-traumatic stress disorder)   . Scoliosis   . Seizures ([place]) AGE 50   NONE SINCE, HAD CHICKEN POX THEN    Past Surgical History:  Procedure Laterality Date  . ANGIOGRAM TO LEG   08-13-15   RIGHT  . CHOLECYSTECTOMY    . IR GENERIC HISTORICAL  04/04/2016   IR RADIOLOGIST EVAL & MGMT 04/04/2016 [name], MD [facility] INTERV RAD  . THYROIDECTOMY N/A 11/17/2015   Procedure: TOTAL THYROIDECTOMY;  Surgeon: Armandina Gemma, MD;  Location: [facility];  Service: General;  Laterality: N/A;  . UTERINE ARTERY EMBOLIZATION Bilateral 08/13/2015    There were no vitals filed for this visit.   Subjective Assessment - 12/31/19 1135    Subjective I was in the ER last night with chest pain. they gave me medicine (aspirin and nitro, morphine). My neck is bothering me.    Patient Stated Goals She wants to strengthen neck muscles    Currently in Pain? Yes    Pain Score 4     Pain Location Neck    Pain Orientation Right;Left    Pain Descriptors / Indicators Aching;Sharp    Pain Type Chronic pain    Pain Radiating Towards R shoulder    Pain Onset More than a month ago    Pain Frequency Constant    Pain Relieving Factors TENS machine at home, massager    Multiple Pain Sites No  [facility] Adult PT Treatment/Exercise - 12/31/19 0001      Exercises   Exercises Neck;Shoulder      Neck Exercises: Seated   Other Seated Exercise cervical flexion 3x30 sec, cervical lateral flexion 3x30 sec, cervical AROM rotation x 10 each side; repeated x 2 bouts before and after [place] and manual therapy. Reviewed HEP      Modalities   Modalities Ultrasound      Ultrasound   Ultrasound Location bil cervical paraspinals and upper trap    Ultrasound Parameters 2.5 w/cm2 x 8 min 100%    Ultrasound Goals Pain      Manual Therapy   Manual therapy comments soft tissue work to [employer], [place] and suboccipital muscles seated                    PT Short Term Goals - 12/31/19 1157      PT SHORT TERM GOAL #1   Title independent with HEP    Time 3    Period Weeks    Status On-going      PT SHORT TERM GOAL #2   Title She will improve cervical side bend to  25 degrees or more    Time 3    Period Weeks    Status On-going      PT SHORT TERM GOAL #3   Title She will demo understanding of good cervical posture to decr stress to neck    Time 3    Period Weeks    Status On-going             PT Long Term Goals - 12/21/19 1213      PT LONG TERM GOAL #1   Title She will improve cervical rotation to 60 degrees to decr pain with rotation    Baseline 50 degrees    Time 6    Period Weeks    Status New      PT LONG TERM GOAL #2   Title She will report pain in neck decreased 75% or more to 2/10 max and intermittant    Baseline 6/10 today    Time 6    Period Weeks    Status New      PT LONG TERM GOAL #3   Title She will report shooting pain in RT arm decr  50% or more    Baseline reports shooting pain into RT arm to RT hand    Time 6    Period Weeks    Status New                 Plan - 12/31/19 1154    Clinical Impression Statement Pt presents to PT on visit 2 for cervical/R shoulder pain. Pt with tightness along cervical musculature extending to upper thoracic region and pain with all movement with R shoulder. Pt would benefit from furhter PT to address above deficits.    Rehab Potential Fair    PT Treatment/Interventions Printmaker;Iontophoresis 67m/ml Dexamethasone;Moist Heat;Therapeutic exercise;Patient/family education;Manual techniques;Dry needling;Passive range of motion;Taping;Ultrasound    PT Next Visit Plan neck isometrics,  modalities and manual as tolerated to neck and shoulder .  Avoid AROM RT shoulder and heavier pressure to RT deltoid. continue to progress postural therex. UKoreacervical as needed.    Consulted and Agree with Plan of Care Patient           Patient will benefit from skilled therapeutic intervention in order to improve the following deficits and impairments:  Pain, Postural dysfunction, Decreased activity tolerance, Decreased range of motion, Decreased strength, Dizziness  Visit  Diagnosis: Radiculopathy, cervical region  Cramp and spasm  Stiffness of neck  Abnormal posture  Chronic right shoulder pain  Muscle weakness (generalized)     Problem List Patient Active Problem List   Diagnosis Date Noted  . Adrenal mass, right ([place]) 04/29/2019  . Chronic cough 03/12/2018  . Left shoulder pain 10/14/2016  . Hypertension 10/14/2016  . S/P thyroidectomy 06/27/2016  . Chronic bilateral low back pain without sciatica 04/22/2016  . Liver lesion 12/24/2015  . Papillary thyroid carcinoma ([place]) 12/21/2015  . Controlled type 2 diabetes mellitus with complication, with long-term current use of insulin ([place]) 10/10/2015  . Vision loss 08/25/2015  . Decreased vision in both eyes 08/25/2015  . Prolonged Q-T interval on ECG 08/19/2015  . History of pulmonary embolism 08/10/2015  . Mild persistent asthma 04/13/2015  . Fibroid, uterine   . Morbid obesity ([place]) 11/11/2014  . Sinus tachycardia 11/08/2014  . Migraine 10/14/2014  . Menorrhagia 09/08/2014    Myra Rude, PT 12/31/2019, 12:25 PM  [facility] [address] [place], [place], [address] Phone: [phone]   Fax:  [phone]  Name: Alishia Lebo MRN: [id number] Date of Birth: 1965-11-30  PHYSICAL THERAPY DISCHARGE SUMMARY  Visits from Start of Care: 2  Current functional level related to goals / functional outcomes: She canceled 2 appointments after this and did not return.    Remaining deficits: Unknown   Education / Equipment: HEP  Plan:                                                    Patient goals were not met. Patient is being discharged due to not returning since the last visit.  ?????   Pearson Forster PT    03/08/20

## 2019-12-31 NOTE — ED Notes (Signed)
Provided with ginger ale for PO challenge

## 2020-01-07 ENCOUNTER — Ambulatory Visit: Payer: Medicaid Other | Admitting: Rehabilitative and Restorative Service Providers"

## 2020-01-07 ENCOUNTER — Telehealth: Payer: Self-pay | Admitting: Rehabilitative and Restorative Service Providers"

## 2020-01-07 NOTE — Telephone Encounter (Signed)
PT phoned and left patient a voicemail reminding her of her next appt and the attendance policy.  Myra Rude, PT, DPT

## 2020-01-10 DIAGNOSIS — E1165 Type 2 diabetes mellitus with hyperglycemia: Secondary | ICD-10-CM | POA: Diagnosis not present

## 2020-01-11 ENCOUNTER — Ambulatory Visit: Payer: Medicaid Other

## 2020-01-12 DIAGNOSIS — E890 Postprocedural hypothyroidism: Secondary | ICD-10-CM | POA: Diagnosis not present

## 2020-01-12 NOTE — Progress Notes (Signed)
Patient ID: Melinda [name], female   DOB: Sep 20, 1965, 54 y.o.   MRN: [id number]     Melinda [name], is a 54 y.o. female  MVV:[id number]  PYY:[id number]  DOB - [birth year]  Subjective:  Chief Complaint and HPI: Melinda [name] is a 54 y.o. female here today  for a follow up visit After being seen in the ED for CP 12/30/2019.  No further CP but she is interested in getting further cardiac work up.  Also having R shoulder issues with her rotator cuff that she is seeing ortho for.    From ED note: MEDICAL DECISION MAKING: Patient here with atypical right-sided chest pain. Does have history of PE. We will proceed with CTA of the chest. She has had 2 high-sensitivity troponins that have been unremarkable here. Low suspicion for ACS, dissection. No infectious symptoms. Chest x-ray shows no pneumothorax or sign of volume overload. Will give morphine for pain control. She also reports recent fall out of bed but no rib fracture seen on x-ray. Asking for x-rays of the upper extremity. X-ray of the humerus and wrist pending. EKG shows no new ischemic abnormality.  ED PROGRESS: X-ray showed no acute abnormality.  CT of the chest shows no acute abnormality.  Seems to be musculoskeletal pain.  Patient's pain improved after morphine but now having nausea.  Will give Zofran and likely discharge home.  At this time, I do not feel there is any life-threatening condition present. I have reviewed, interpreted and discussed all results (EKG, imaging, lab, urine as appropriate) and exam findings with patient/family. I have reviewed nursing notes and appropriate previous records.  I feel the patient is safe to be discharged home without further emergent workup and can continue workup as an outpatient as needed. Discussed usual and customary return precautions. Patient/family verbalize understanding and are comfortable with this plan.  Outpatient follow-up has been provided as needed. All questions have been  answered.   ED/Hospital notes reviewed.   Social History: Family history:  ROS:   Constitutional:  No f/c, No night sweats, No unexplained weight loss. EENT:  No vision changes, No blurry vision, No hearing changes. No mouth, throat, or ear problems.  Respiratory: No cough, No SOB Cardiac: No CP, no palpitations GI:  No abd pain, No N/V/D. GU: No Urinary s/sx Musculoskeletal: see above Neuro: No headache, no dizziness, no motor weakness.  Skin: No rash Endocrine:  No polydipsia. No polyuria.  Psych: Denies SI/HI  No problems updated.  ALLERGIES: Allergies  Allergen Reactions  . Ceftriaxone Anaphylaxis    ROCEPHIN  . Penicillins Shortness Of Breath    Has patient had a PCN reaction causing immediate rash, facial/tongue/throat swelling, SOB or lightheadedness with hypotension: Yes Has patient had a PCN reaction causing severe rash involving mucus membranes or skin necrosis: No Has patient had a PCN reaction that required hospitalization No Has patient had a PCN reaction occurring within the last 10 years: No If all of the above answers are "NO", then [name] proceed with Cephalosporin use.   . Shellfish Allergy Anaphylaxis  . Flonase [Fluticasone Propionate]     Makes migraines worse  . Gold-Containing Drug Products     HANDS ITCH  . Nickel     HANDS SWELL  . Citrus Rash    PAST MEDICAL HISTORY: Past Medical History:  Diagnosis Date  . ADHD (attention deficit hyperactivity disorder)   . Arthritis   . Asthma   . Cancer ([facility])   . Chicken pox AGE 89  .  COPD (chronic obstructive pulmonary disease) ([facility])    pt reported  . Diabetes mellitus without complication ([facility])   . Diverticulosis, sigmoid   . GERD (gastroesophageal reflux disease)   . Glaucoma    BOTH EYES, NO EYE DROPS  . Glaucoma   . History of blood transfusion MARCH 2016    2UNITS GIVEN AND IRON GIVEN  . Hyperglycemia 09/09/2014  . Hypertension   . 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 Iron deficiency anemia due to chronic blood loss 09/09/2014  . Low TSH level 09/08/2014  . Lumbar herniated disc   . Menorrhagia 09/08/2014  . Migraine    CLUSTER AND MIGRAINES  . Multiple thyroid nodules   . Ovarian mass, right 09/09/2014  . PE (pulmonary embolism) 2016  . Peripheral neuropathy    FINGER TIPS AND TOES NUMB SOME  . Preeclampsia  1983   WITH PREGNANCY  . PTSD (post-traumatic stress disorder)   . Scoliosis   . Seizures ([place]) AGE 8   NONE SINCE, HAD CHICKEN POX THEN    MEDICATIONS AT HOME: Prior to Admission medications   Medication Sig Start Date End Date Taking? Authorizing Provider  apixaban (ELIQUIS) 5 MG TABS tablet TAKE ONE TABLET BY MOUTH TWICE DAILY (AM+PM) 01/19/20  Yes Jennefer Kopp, Goddess M, PA-C  atorvastatin (LIPITOR) 20 MG tablet TAKE ONE TABLET BY MOUTH ONCE DAILY (BEDTIME) 01/19/20  Yes Meghana Tullo, Sue M, PA-C  budesonide-formoterol (SYMBICORT) 160-4.5 MCG/ACT inhaler Inhale 2 puffs into the lungs 2 (two) times daily. 01/19/20  Yes Gardenia Witter, Eilleen M, PA-C  buPROPion (WELLBUTRIN XL) 300 MG 24 hr tablet Take 300 mg by mouth daily.  09/12/17  Yes [provider]  busPIRone (BUSPAR) 5 MG tablet Take 5 mg by mouth 3 (three) times daily.   Yes [provider]  citalopram (CELEXA) 40 MG tablet Take 40 mg by mouth daily.  12/01/17  Yes [provider]  doxepin (SINEQUAN) 10 MG capsule Take 10 mg by mouth.   Yes [provider]  fluticasone (CUTIVATE) 0.05 % cream Apply topically 2 (two) times daily. 05/13/16  Yes Funches, Josalyn, MD  hydrochlorothiazide (MICROZIDE) 12.5 MG capsule TAKE ONE CAPSULE BY MOUTH ONCE DAILY(AM) 01/19/20  Yes Caelie Remsburg, Chasidy M, PA-C  loratadine (CLARITIN) 10 MG tablet Take 1 tablet (10 mg total) by mouth daily. 10/31/17  Yes [name], MD  metoprolol tartrate (LOPRESSOR) 50 MG tablet 1 bid 01/19/20  Yes Samika Vetsch, Carroll M, PA-C  montelukast (SINGULAIR) 10 MG  tablet TAKE 1 TABLET BY MOUTH EVERY EVENING AT BEDTIME 01/19/20  Yes Matea, Stanard, PA-C  PAZEO 0.7 % SOLN  08/25/18  Yes [provider]  prazosin (MINIPRESS) 1 MG capsule Take 5 mg by mouth at bedtime.    Yes [provider]  SYNTHROID 200 MCG tablet 212 mcg.  10/29/18  Yes [provider]  topiramate (TOPAMAX) 200 MG tablet Take 1 tablet (200 mg total) by mouth at bedtime. 08/18/19  Yes Jaffe, Adam R, DO  ACCU-CHEK AVIVA PLUS test strip USE TO TEST BLOOD GLUCOSE FOUR TIMES DAILY BEFORE MEALS AND AT BEDTIME Patient not taking: Reported on 12/21/2019 11/06/15   Boykin Nearing, MD  ACCU-CHEK SOFTCLIX LANCETS lancets  10/20/17   [provider]  albuterol (PROVENTIL) (2.5 MG/3ML) 0.083% nebulizer solution Take 3 mLs (2.5 mg total) by nebulization every 6 (six) hours as needed for wheezing or shortness of breath.  01/19/20   Argentina Donovan, PA-C  cyclobenzaprine (FLEXERIL) 10 MG tablet Take 1 tablet (10 mg total) by mouth 3 (three) times daily as needed for muscle spasms. 01/19/20   Argentina Donovan, PA-C  diazepam (VALIUM) 10 MG tablet Take 1/2-1 tablet 1 hour prior to MRI. Patient not taking: Reported on 01/19/2020 01/18/20   Thurman Coyer, DO  gabapentin (NEURONTIN) 300 MG capsule Take 1 capsule (300 mg total) by mouth 3 (three) times daily. 01/19/20   Argentina Donovan, PA-C  traMADol (ULTRAM) 50 MG tablet TAKE 1 TABLET BY MOUTH DAILY AS NEEDED FOR MODERATE OR SEVERE PAIN. Patient not taking: Reported on 01/19/2020 12/10/19   Lilia Argue R, DO     Objective:  EXAM:   Vitals:   01/19/20 1426  BP: 124/82  Pulse: 81  Resp: 20  Temp: (!) 97.2 F (36.2 C)  SpO2: 97%  Weight: (!) 324 lb (147 kg)  Height: 5\' 10"  (1.778 m)    General appearance : A&OX3. NAD. Non-toxic-appearing HEENT: Atraumatic and Normocephalic.  PERRLA. EOM intact.  Chest/Lungs:  Breathing-non-labored, Good air entry bilaterally, breath sounds normal without rales, rhonchi, or  wheezing  CVS: S1 S2 regular, no murmurs, gallops, rubs . Extremities: Bilateral Lower Ext shows no edema, both legs are warm to touch with = pulse throughout Neurology:  CN II-XII grossly intact, Non focal.   Psych:  TP linear. J/I WNL. Normal speech. Appropriate eye contact and affect.  Skin:  No Rash  Data Review Lab Results  Component Value Date   HGBA1C 6.0 (A) 01/19/2020   HGBA1C 6.0 (A) 02/24/2019   HGBA1C 5.3 05/27/2018     Assessment & Plan   1. Controlled type 2 diabetes mellitus with complication, with long-term current use of insulin (HCC) Controlled. Continue to work on diet - Glucose (CBG) - POCT glycosylated hemoglobin (Hb A1C) - atorvastatin (LIPITOR) 20 MG tablet; TAKE ONE TABLET BY MOUTH ONCE DAILY (BEDTIME)  Dispense: 30 tablet; Refill: 2  2. Traumatic tear of right rotator cuff, unspecified tear extent, subsequent encounter Per patient request increased dose on gabapentin - gabapentin (NEURONTIN) 300 MG capsule; Take 1 capsule (300 mg total) by mouth 3 (three) times daily.  Dispense: 90 capsule; Refill: 3 - cyclobenzaprine (FLEXERIL) 10 MG tablet; Take 1 tablet (10 mg total) by mouth 3 (three) times daily as needed for muscle spasms.  Dispense: 30 tablet; Refill: 0  3. Hypertension, unspecified type Controlled-continue current regimen- hydrochlorothiazide (MICROZIDE) 12.5 MG capsule; TAKE ONE CAPSULE BY MOUTH ONCE DAILY(AM)  Dispense: 30 capsule; Refill: 2 - metoprolol tartrate (LOPRESSOR) 50 MG tablet; 1 bid  Dispense: 60 tablet; Refill: 2 - Ambulatory referral to Cardiology  4. Severe persistent asthma without complication - albuterol (PROVENTIL) (2.5 MG/3ML) 0.083% nebulizer solution; Take 3 mLs (2.5 mg total) by nebulization every 6 (six) hours as needed for wheezing or shortness of breath.  Dispense: 3 mL; Refill: 3 - budesonide-formoterol (SYMBICORT) 160-4.5 MCG/ACT inhaler; Inhale 2 puffs into the lungs 2 (two) times daily.  Dispense: 2 Inhaler; Refill:  0 - montelukast (SINGULAIR) 10 MG tablet; TAKE 1 TABLET BY MOUTH EVERY EVENING AT BEDTIME  Dispense: 30 tablet; Refill: 2  5. History of DVT in adulthood  - apixaban (ELIQUIS) 5 MG TABS tablet; TAKE ONE TABLET BY MOUTH TWICE DAILY (AM+PM)  Dispense: 60 tablet; Refill: 2 - Ambulatory referral to Cardiology  6. Encounter for examination following treatment at hospital - Ambulatory referral to Cardiology.  CP resolved but has multiple cardiac  risk factors that warrant cardiac work-up.    7. Prolonged Q-T interval on ECG - Ambulatory referral to Cardiology   Patient have been counseled extensively about nutrition and exercise  Return in about 3 months (around 04/20/2020) for for fasting bloodwork with PCP.  The patient was given clear instructions to go to ER or return to medical center if symptoms don't improve, worsen or new problems develop. The patient verbalized understanding. The patient was told to call to get lab results if they haven't heard anything in the next week.     Freeman Caldron, PA-C [facility] [place], Vivian   01/19/2020, 3:02 PM

## 2020-01-13 ENCOUNTER — Ambulatory Visit: Payer: Medicaid Other

## 2020-01-17 ENCOUNTER — Other Ambulatory Visit: Payer: Self-pay

## 2020-01-17 ENCOUNTER — Ambulatory Visit
Admission: RE | Admit: 2020-01-17 | Discharge: 2020-01-17 | Disposition: A | Discharge status: Home / Self Care | Payer: Medicaid Other | Source: Ambulatory Visit | Attending: Sports Medicine | Admitting: Sports Medicine

## 2020-01-17 DIAGNOSIS — M25511 Pain in right shoulder: Secondary | ICD-10-CM

## 2020-01-18 ENCOUNTER — Other Ambulatory Visit: Payer: Self-pay

## 2020-01-18 DIAGNOSIS — M25511 Pain in right shoulder: Secondary | ICD-10-CM

## 2020-01-18 MED ORDER — DIAZEPAM 10 MG PO TABS
ORAL_TABLET | ORAL | 0 refills | Status: DC
Start: 2020-01-18 — End: 2020-02-16

## 2020-01-18 NOTE — Progress Notes (Signed)
Pt called to let us know she couldn't complete the MRI due to being claustrophobic. She was asking what to do.  Told pt we will order an open MRI and give her 1 tablet of valium to take 1 hour prior to her exam. She understands and agrees with the plan.

## 2020-01-19 ENCOUNTER — Other Ambulatory Visit: Payer: Self-pay

## 2020-01-19 ENCOUNTER — Ambulatory Visit: Payer: Medicaid Other | Attending: Physician Assistant | Admitting: Physician Assistant

## 2020-01-19 ENCOUNTER — Encounter: Payer: Self-pay | Admitting: Physician Assistant

## 2020-01-19 VITALS — BP 124/82 | HR 81 | Temp 97.2°F | Resp 20 | Ht 70.0 in | Wt 324.0 lb

## 2020-01-19 DIAGNOSIS — S46011D Strain of muscle(s) and tendon(s) of the rotator cuff of right shoulder, subsequent encounter: Secondary | ICD-10-CM

## 2020-01-19 DIAGNOSIS — E118 Type 2 diabetes mellitus with unspecified complications: Secondary | ICD-10-CM

## 2020-01-19 DIAGNOSIS — Z09 Encounter for follow-up examination after completed treatment for conditions other than malignant neoplasm: Secondary | ICD-10-CM

## 2020-01-19 DIAGNOSIS — J4550 Severe persistent asthma, uncomplicated: Secondary | ICD-10-CM

## 2020-01-19 DIAGNOSIS — I10 Essential (primary) hypertension: Secondary | ICD-10-CM | POA: Diagnosis not present

## 2020-01-19 DIAGNOSIS — Z86718 Personal history of other venous thrombosis and embolism: Secondary | ICD-10-CM

## 2020-01-19 DIAGNOSIS — Z794 Long term (current) use of insulin: Secondary | ICD-10-CM | POA: Diagnosis not present

## 2020-01-19 DIAGNOSIS — R9431 Abnormal electrocardiogram [ECG] [EKG]: Secondary | ICD-10-CM | POA: Diagnosis not present

## 2020-01-19 LAB — POCT GLYCOSYLATED HEMOGLOBIN (HGB A1C): Hemoglobin A1C: 6 % — AB (ref 4.0–5.6)

## 2020-01-19 LAB — GLUCOSE, POCT (MANUAL RESULT ENTRY): POC Glucose: 106 mg/dl — AB (ref 70–99)

## 2020-01-19 MED ORDER — CYCLOBENZAPRINE HCL 10 MG PO TABS: 10.0000 mg | ORAL_TABLET | Freq: Three times a day (TID) | ORAL | 0 refills | Status: DC | PRN

## 2020-01-19 MED ORDER — METOPROLOL TARTRATE 50 MG PO TABS: ORAL_TABLET | ORAL | 2 refills | Status: DC

## 2020-01-19 MED ORDER — BUDESONIDE-FORMOTEROL FUMARATE 160-4.5 MCG/ACT IN AERO: 2.0000 | INHALATION_SPRAY | Freq: Two times a day (BID) | RESPIRATORY_TRACT | 0 refills | Status: DC

## 2020-01-19 MED ORDER — GABAPENTIN 300 MG PO CAPS: 300.0000 mg | ORAL_CAPSULE | Freq: Three times a day (TID) | ORAL | 3 refills | Status: DC

## 2020-01-19 MED ORDER — HYDROCHLOROTHIAZIDE 12.5 MG PO CAPS: ORAL_CAPSULE | ORAL | 2 refills | Status: DC

## 2020-01-19 MED ORDER — APIXABAN 5 MG PO TABS: ORAL_TABLET | ORAL | 2 refills | Status: DC

## 2020-01-19 MED ORDER — ALBUTEROL SULFATE (2.5 MG/3ML) 0.083% IN NEBU: 2.5000 mg | INHALATION_SOLUTION | Freq: Four times a day (QID) | RESPIRATORY_TRACT | 3 refills | Status: DC | PRN

## 2020-01-19 MED ORDER — MONTELUKAST SODIUM 10 MG PO TABS: ORAL_TABLET | ORAL | 2 refills | Status: DC

## 2020-01-19 MED ORDER — ATORVASTATIN CALCIUM 20 MG PO TABS: ORAL_TABLET | ORAL | 2 refills | Status: DC

## 2020-01-19 NOTE — Progress Notes (Signed)
Here for ED F /u

## 2020-01-21 ENCOUNTER — Other Ambulatory Visit: Payer: Self-pay | Admitting: Internal Medicine

## 2020-01-30 DIAGNOSIS — E1165 Type 2 diabetes mellitus with hyperglycemia: Secondary | ICD-10-CM | POA: Diagnosis not present

## 2020-02-02 DIAGNOSIS — H31092 Other chorioretinal scars, left eye: Secondary | ICD-10-CM | POA: Diagnosis not present

## 2020-02-02 DIAGNOSIS — H2513 Age-related nuclear cataract, bilateral: Secondary | ICD-10-CM | POA: Diagnosis not present

## 2020-02-02 DIAGNOSIS — H04123 Dry eye syndrome of bilateral lacrimal glands: Secondary | ICD-10-CM | POA: Diagnosis not present

## 2020-02-02 DIAGNOSIS — H0102A Squamous blepharitis right eye, upper and lower eyelids: Secondary | ICD-10-CM | POA: Diagnosis not present

## 2020-02-02 DIAGNOSIS — H40013 Open angle with borderline findings, low risk, bilateral: Secondary | ICD-10-CM | POA: Diagnosis not present

## 2020-02-02 DIAGNOSIS — H0102B Squamous blepharitis left eye, upper and lower eyelids: Secondary | ICD-10-CM | POA: Diagnosis not present

## 2020-02-02 DIAGNOSIS — E119 Type 2 diabetes mellitus without complications: Secondary | ICD-10-CM | POA: Diagnosis not present

## 2020-02-02 DIAGNOSIS — H05243 Constant exophthalmos, bilateral: Secondary | ICD-10-CM | POA: Diagnosis not present

## 2020-02-02 LAB — HM DIABETES EYE EXAM

## 2020-02-03 DIAGNOSIS — H5213 Myopia, bilateral: Secondary | ICD-10-CM | POA: Diagnosis not present

## 2020-02-15 ENCOUNTER — Ambulatory Visit
Admission: RE | Admit: 2020-02-15 | Discharge: 2020-02-15 | Disposition: A | Discharge status: Home / Self Care | Payer: Medicaid Other | Source: Ambulatory Visit | Attending: Sports Medicine | Admitting: Sports Medicine

## 2020-02-15 DIAGNOSIS — M25511 Pain in right shoulder: Secondary | ICD-10-CM

## 2020-02-16 ENCOUNTER — Encounter: Payer: Self-pay | Admitting: Cardiovascular Disease

## 2020-02-16 ENCOUNTER — Ambulatory Visit (INDEPENDENT_AMBULATORY_CARE_PROVIDER_SITE_OTHER): Payer: Medicaid Other | Admitting: Cardiovascular Disease

## 2020-02-16 ENCOUNTER — Other Ambulatory Visit: Payer: Self-pay

## 2020-02-16 VITALS — BP 118/76 | HR 85 | Ht 70.0 in | Wt 332.0 lb

## 2020-02-16 DIAGNOSIS — R0789 Other chest pain: Secondary | ICD-10-CM | POA: Diagnosis not present

## 2020-02-16 DIAGNOSIS — I10 Essential (primary) hypertension: Secondary | ICD-10-CM

## 2020-02-16 DIAGNOSIS — E785 Hyperlipidemia, unspecified: Secondary | ICD-10-CM | POA: Insufficient documentation

## 2020-02-16 DIAGNOSIS — E782 Mixed hyperlipidemia: Secondary | ICD-10-CM | POA: Diagnosis not present

## 2020-02-16 DIAGNOSIS — Z86711 Personal history of pulmonary embolism: Secondary | ICD-10-CM | POA: Diagnosis not present

## 2020-02-16 NOTE — Patient Instructions (Signed)
Medication Instructions:  The current medical regimen is effective;  continue present plan and medications.  *If you need a refill on your cardiac medications before your next appointment, please call your pharmacy*  Testing/Procedures: Coronary CALCIUM SCORE   Follow-Up: At [facility], you and your health needs are our priority.  As part of our continuing mission to provide you with exceptional heart care, we have created designated Provider Care Teams.  These Care Teams include your primary Cardiologist (physician) and Advanced Practice Providers (APPs -  Physician Assistants and Nurse Practitioners) who all work together to provide you with the care you need, when you need it.  We recommend signing up for the patient portal called "MyChart".  Sign up information is provided on this After Visit Summary.  MyChart is used to connect with patients for Virtual Visits (Telemedicine).  Patients are able to view lab/test results, encounter notes, upcoming appointments, etc.  Non-urgent messages can be sent to your provider as well.   To learn more about what you can do with MyChart, go to [email or web address].    Your next appointment:   As needed  The format for your next appointment:   In Person  Provider:   Quay Burow, MD

## 2020-02-16 NOTE — Assessment & Plan Note (Signed)
Melinda [name] is referred to me for atypical chest pain.  She was recently seen in the ER on 12/30/2019 with atypical right-sided chest pain.  Her enzymes were negative x2.  Her EKG showed no acute changes.  Her chest CT showed no evidence of dissection or pulmonary embolism.  There was no mention of coronary calcification on the chest CT.  She gets rare chest pain that occurs every several months for no particular reason on the right side and substernal.  I suspect this is noncardiac despite her risk factors.  I Georgina Peer get a coronary calcium score to further evaluate.

## 2020-02-16 NOTE — Progress Notes (Signed)
02/16/2020 Melinda [name]   1965/11/19  [id number]  Primary Physician [name], MD Primary Cardiologist: Lorretta Harp MD Melinda [name], [place]  HPI:  Melinda [name] is a 54 y.o. morbidly overweight single African-American female mother of 1 son, grandmother of 1 grandson referred to me by Freeman Caldron, PA-C for evaluation of atypical chest pain.  She is currently disabled because of back issues and walks with a walker because of instability.  Risk factors include 15-pack-year tobacco abuse having quit in 2016, treated hypertension, hyperlipidemia and diet-controlled diabetes.  There is no family history for heart disease.  She is never had a heart attack or stroke.  She has had a remote pulmonary embolus [name] 2016 on Eliquis oral anticoagulation.  She is also had thyroid cancer status post surgical resection by Dr. Lynford Humphrey 11/17/2015.  She was seen in the emergency room on 12/30/2019 with atypical right-sided chest pain with a negative work-up.  She has had rare subsequent episodes.   Current Meds  Medication Sig  . albuterol (PROVENTIL) (2.5 MG/3ML) 0.083% nebulizer solution Take 3 mLs (2.5 mg total) by nebulization every 6 (six) hours as needed for wheezing or shortness of breath.  Marland Kitchen apixaban (ELIQUIS) 5 MG TABS tablet TAKE ONE TABLET BY MOUTH TWICE DAILY (AM+PM)  . atorvastatin (LIPITOR) 20 MG tablet TAKE ONE TABLET BY MOUTH ONCE DAILY (BEDTIME)  . budesonide-formoterol (SYMBICORT) 160-4.5 MCG/ACT inhaler Inhale 2 puffs into the lungs 2 (two) times daily.  Marland Kitchen buPROPion (WELLBUTRIN XL) 300 MG 24 hr tablet Take 300 mg by mouth daily.   . busPIRone (BUSPAR) 5 MG tablet Take 5 mg by mouth 3 (three) times daily.  . citalopram (CELEXA) 40 MG tablet Take 40 mg by mouth daily.   . cyclobenzaprine (FLEXERIL) 10 MG tablet Take 1 tablet (10 mg total) by mouth 3 (three) times daily as needed for muscle spasms.  Marland Kitchen doxepin (SINEQUAN) 10 MG capsule Take 10 mg by mouth.  . fluticasone  (CUTIVATE) 0.05 % cream Apply topically 2 (two) times daily.  Marland Kitchen gabapentin (NEURONTIN) 300 MG capsule Take 1 capsule (300 mg total) by mouth 3 (three) times daily.  . hydrochlorothiazide (MICROZIDE) 12.5 MG capsule TAKE ONE CAPSULE BY MOUTH ONCE DAILY(AM)  . loratadine (CLARITIN) 10 MG tablet Take 1 tablet (10 mg total) by mouth daily.  . metoprolol tartrate (LOPRESSOR) 50 MG tablet 1 bid  . montelukast (SINGULAIR) 10 MG tablet TAKE 1 TABLET BY MOUTH EVERY EVENING AT BEDTIME  . PAZEO 0.7 % SOLN   . prazosin (MINIPRESS) 1 MG capsule Take 5 mg by mouth at bedtime.   Marland Kitchen SYNTHROID 200 MCG tablet 212 mcg.   . topiramate (TOPAMAX) 200 MG tablet Take 1 tablet (200 mg total) by mouth at bedtime.     Allergies  Allergen Reactions  . Ceftriaxone Anaphylaxis    ROCEPHIN  . Penicillins Shortness Of Breath    Has patient had a PCN reaction causing immediate rash, facial/tongue/throat swelling, SOB or lightheadedness with hypotension: Yes Has patient had a PCN reaction causing severe rash involving mucus membranes or skin necrosis: No Has patient had a PCN reaction that required hospitalization No Has patient had a PCN reaction occurring within the last 10 years: No If all of the above answers are "NO", then [name] proceed with Cephalosporin use.   . Shellfish Allergy Anaphylaxis  . Flonase [Fluticasone Propionate]     Makes migraines worse  . Gold-Containing Drug Products     HANDS ITCH  .  Nickel     HANDS SWELL  . Citrus Rash    Social History   Socioeconomic History  . Marital status: Single    Spouse name: Not on file  . Number of children: 1  . Years of education: 107  . Highest education level: Some college, no degree  Occupational History  . Occupation: disabled  Tobacco Use  . Smoking status: Former Smoker    Packs/day: 0.50    Years: 40.00    Pack years: 20.00    Types: Cigarettes    Quit date: 11/03/2014    Years since quitting: 5.2  . Smokeless tobacco: Never Used  Vaping  Use  . Vaping Use: Never used  Substance and Sexual Activity  . Alcohol use: No    Alcohol/week: 0.0 standard drinks  . Drug use: No  . Sexual activity: Never    Birth control/protection: None  Other Topics Concern  . Not on file  Social History Narrative   Patient is right-handed. She lives in a one level handicap accessible apartment. She avoids caffeine. She uses stationary stationary pedals to use for exercise.   Social Determinants of Health   Financial Resource Strain:   . Difficulty of Paying Living Expenses:   Food Insecurity:   . Worried About Charity fundraiser in the Last Year:   . Arboriculturist in the Last Year:   Transportation Needs:   . Film/video editor (Medical):   Marland Kitchen Lack of Transportation (Non-Medical):   Physical Activity:   . Days of Exercise per Week:   . Minutes of Exercise per Session:   Stress:   . Feeling of Stress :   Social Connections:   . Frequency of Communication with Friends and Family:   . Frequency of Social Gatherings with Friends and Family:   . Attends Religious Services:   . Active Member of Clubs or Organizations:   . Attends Archivist Meetings:   Marland Kitchen Marital Status:   Intimate Partner Violence:   . Fear of Current or Ex-Partner:   . Emotionally Abused:   Marland Kitchen Physically Abused:   . Sexually Abused:      Review of Systems: General: negative for chills, fever, night sweats or weight changes.  Cardiovascular: negative for chest pain, dyspnea on exertion, edema, orthopnea, palpitations, paroxysmal nocturnal dyspnea or shortness of breath Dermatological: negative for rash Respiratory: negative for cough or wheezing Urologic: negative for hematuria Abdominal: negative for nausea, vomiting, diarrhea, bright red blood per rectum, melena, or hematemesis Neurologic: negative for visual changes, syncope, or dizziness All other systems reviewed and are otherwise negative except as noted above.    Blood pressure 118/76,  pulse 85, height 5\' 10"  (1.778 m), weight (!) 332 lb (150.6 kg), SpO2 99 %.  General appearance: alert and no distress Neck: no adenopathy, no carotid bruit, no JVD, supple, symmetrical, trachea midline and thyroid not enlarged, symmetric, no tenderness/mass/nodules Lungs: clear to auscultation bilaterally Heart: regular rate and rhythm, S1, S2 normal, no murmur, click, rub or gallop Extremities: extremities normal, atraumatic, no cyanosis or edema Pulses: 2+ and symmetric Skin: Skin color, texture, turgor normal. No rashes or lesions Neurologic: Alert and oriented X 3, normal strength and tone. Normal symmetric reflexes. Normal coordination and gait  EKG sinus rhythm at 85 with nonspecific ST and T wave changes.  I personally reviewed this EKG.  ASSESSMENT AND PLAN:   History of pulmonary embolism History of prior pulmonary embolism back in 2016 on Eliquis oral  anticoagulation.  Hypertension History of essential hypertension with blood pressure measured today of 118/76.  She is on hydrochlorothiazide, metoprolol and Minipress.  Hyperlipidemia Patient hyperlipidemia on statin therapy with lipid profile performed 04/29/2019 revealing total cholesterol 156, LDL of 82 and HDL 52.  Atypical chest pain Ms. Harrington Challenger is referred to me for atypical chest pain.  She was recently seen in the ER on 12/30/2019 with atypical right-sided chest pain.  Her enzymes were negative x2.  Her EKG showed no acute changes.  Her chest CT showed no evidence of dissection or pulmonary embolism.  There was no mention of coronary calcification on the chest CT.  She gets rare chest pain that occurs every several months for no particular reason on the right side and substernal.  I suspect this is noncardiac despite her risk factors.  I Georgina Peer get a coronary calcium score to further evaluate.      Lorretta Harp MD FACP,FACC,FAHA, [facility] 02/16/2020 9:31 AM

## 2020-02-16 NOTE — Assessment & Plan Note (Signed)
History of essential hypertension with blood pressure measured today of 118/76.  She is on hydrochlorothiazide, metoprolol and Minipress.

## 2020-02-16 NOTE — Assessment & Plan Note (Signed)
Patient hyperlipidemia on statin therapy with lipid profile performed 04/29/2019 revealing total cholesterol 156, LDL of 82 and HDL 52.

## 2020-02-16 NOTE — Assessment & Plan Note (Signed)
History of prior pulmonary embolism back in 2016 on Eliquis oral anticoagulation.

## 2020-02-17 ENCOUNTER — Other Ambulatory Visit: Payer: Self-pay | Admitting: Internal Medicine

## 2020-02-17 ENCOUNTER — Ambulatory Visit: Payer: Medicaid Other | Admitting: Internal Medicine

## 2020-02-17 DIAGNOSIS — F411 Generalized anxiety disorder: Secondary | ICD-10-CM | POA: Diagnosis not present

## 2020-02-17 DIAGNOSIS — F4310 Post-traumatic stress disorder, unspecified: Secondary | ICD-10-CM | POA: Diagnosis not present

## 2020-02-17 DIAGNOSIS — S46011D Strain of muscle(s) and tendon(s) of the rotator cuff of right shoulder, subsequent encounter: Secondary | ICD-10-CM

## 2020-02-17 DIAGNOSIS — F331 Major depressive disorder, recurrent, moderate: Secondary | ICD-10-CM | POA: Diagnosis not present

## 2020-02-17 NOTE — Telephone Encounter (Signed)
Pt is requesting a refill for cyclobenzaprine (FLEXERIL) 10 MG tablet    Pharmacy: [place], [place] - [address]  [place], [place] [address]  Phone:  [phone] Fax:  [phone]

## 2020-02-17 NOTE — Telephone Encounter (Signed)
Requested medication (s) are due for refill today: yes  Requested medication (s) are on the active medication list: yes  Last refill:  01/19/20  #30  0 refills  Future visit scheduled: yes  Notes to clinic:  Medication not delegated    Requested Prescriptions  Pending Prescriptions Disp Refills   cyclobenzaprine (FLEXERIL) 10 MG tablet 30 tablet 0    Sig: Take 1 tablet (10 mg total) by mouth 3 (three) times daily as needed for muscle spasms.      Not Delegated - Analgesics:  Muscle Relaxants Failed - 02/17/2020  1:32 PM      Failed - This refill cannot be delegated      Passed - Valid encounter within last 6 months    Recent Outpatient Visits           4 weeks ago Controlled type 2 diabetes mellitus with complication, with long-term current use of insulin [facility])   [place], [place], [name]   4 months ago Controlled type 2 diabetes mellitus with complication, without long-term current use of insulin [facility])   [place] [name] B, MD   8 months ago Pap smear for cervical cancer screening   [place] [name] B, MD   9 months ago Chronic bilateral low back pain without sciatica   [place] [name], MD   11 months ago Controlled type 2 diabetes mellitus with complication, without long-term current use of insulin [facility])   [place], [place]ne Bucy, [name]       Future Appointments             In 2 months Wynetta Emery, Dalbert Batman, MD [place]

## 2020-02-18 MED ORDER — CYCLOBENZAPRINE HCL 10 MG PO TABS: 10.0000 mg | ORAL_TABLET | Freq: Three times a day (TID) | ORAL | 0 refills | Status: DC | PRN

## 2020-03-01 DIAGNOSIS — E1165 Type 2 diabetes mellitus with hyperglycemia: Secondary | ICD-10-CM | POA: Diagnosis not present

## 2020-03-08 ENCOUNTER — Other Ambulatory Visit: Payer: Self-pay | Admitting: Internal Medicine

## 2020-03-08 DIAGNOSIS — E890 Postprocedural hypothyroidism: Secondary | ICD-10-CM | POA: Diagnosis not present

## 2020-03-08 DIAGNOSIS — Z85850 Personal history of malignant neoplasm of thyroid: Secondary | ICD-10-CM | POA: Diagnosis not present

## 2020-03-08 DIAGNOSIS — E119 Type 2 diabetes mellitus without complications: Secondary | ICD-10-CM | POA: Diagnosis not present

## 2020-03-08 DIAGNOSIS — R609 Edema, unspecified: Secondary | ICD-10-CM | POA: Diagnosis not present

## 2020-03-08 DIAGNOSIS — R600 Localized edema: Secondary | ICD-10-CM

## 2020-03-08 DIAGNOSIS — E039 Hypothyroidism, unspecified: Secondary | ICD-10-CM | POA: Diagnosis not present

## 2020-03-08 DIAGNOSIS — M7989 Other specified soft tissue disorders: Secondary | ICD-10-CM

## 2020-03-10 ENCOUNTER — Other Ambulatory Visit: Payer: Self-pay | Admitting: Internal Medicine

## 2020-03-10 ENCOUNTER — Other Ambulatory Visit: Payer: Medicaid Other

## 2020-03-10 DIAGNOSIS — Z1231 Encounter for screening mammogram for malignant neoplasm of breast: Secondary | ICD-10-CM

## 2020-03-14 ENCOUNTER — Ambulatory Visit
Admission: RE | Admit: 2020-03-14 | Discharge: 2020-03-14 | Disposition: A | Discharge status: Home / Self Care | Payer: Medicaid Other | Source: Ambulatory Visit | Attending: Internal Medicine | Admitting: Internal Medicine

## 2020-03-14 DIAGNOSIS — R600 Localized edema: Secondary | ICD-10-CM | POA: Diagnosis not present

## 2020-03-14 DIAGNOSIS — M7989 Other specified soft tissue disorders: Secondary | ICD-10-CM

## 2020-03-17 ENCOUNTER — Other Ambulatory Visit: Payer: Self-pay | Admitting: Physician Assistant

## 2020-03-17 DIAGNOSIS — Z86718 Personal history of other venous thrombosis and embolism: Secondary | ICD-10-CM

## 2020-03-17 DIAGNOSIS — I10 Essential (primary) hypertension: Secondary | ICD-10-CM

## 2020-03-17 DIAGNOSIS — J4550 Severe persistent asthma, uncomplicated: Secondary | ICD-10-CM

## 2020-03-17 DIAGNOSIS — E118 Type 2 diabetes mellitus with unspecified complications: Secondary | ICD-10-CM

## 2020-03-31 ENCOUNTER — Ambulatory Visit
Admission: RE | Admit: 2020-03-31 | Discharge: 2020-03-31 | Disposition: A | Discharge status: Home / Self Care | Payer: Medicaid Other | Source: Ambulatory Visit | Attending: Internal Medicine | Admitting: Internal Medicine

## 2020-03-31 ENCOUNTER — Other Ambulatory Visit: Payer: Self-pay

## 2020-03-31 DIAGNOSIS — Z1231 Encounter for screening mammogram for malignant neoplasm of breast: Secondary | ICD-10-CM

## 2020-03-31 DIAGNOSIS — E1165 Type 2 diabetes mellitus with hyperglycemia: Secondary | ICD-10-CM | POA: Diagnosis not present

## 2020-04-15 ENCOUNTER — Other Ambulatory Visit: Payer: Self-pay | Admitting: Physician Assistant

## 2020-04-15 DIAGNOSIS — S46011D Strain of muscle(s) and tendon(s) of the rotator cuff of right shoulder, subsequent encounter: Secondary | ICD-10-CM

## 2020-04-20 ENCOUNTER — Other Ambulatory Visit: Payer: Self-pay

## 2020-04-20 ENCOUNTER — Ambulatory Visit: Payer: Medicaid Other | Attending: Internal Medicine | Admitting: Internal Medicine

## 2020-04-20 ENCOUNTER — Encounter: Payer: Self-pay | Admitting: Internal Medicine

## 2020-04-20 VITALS — BP 117/78 | HR 80 | Resp 16 | Wt 329.8 lb

## 2020-04-20 DIAGNOSIS — Z1331 Encounter for screening for depression: Secondary | ICD-10-CM | POA: Diagnosis not present

## 2020-04-20 DIAGNOSIS — E1142 Type 2 diabetes mellitus with diabetic polyneuropathy: Secondary | ICD-10-CM

## 2020-04-20 DIAGNOSIS — Z1159 Encounter for screening for other viral diseases: Secondary | ICD-10-CM

## 2020-04-20 DIAGNOSIS — Z9181 History of falling: Secondary | ICD-10-CM | POA: Insufficient documentation

## 2020-04-20 DIAGNOSIS — I10 Essential (primary) hypertension: Secondary | ICD-10-CM

## 2020-04-20 DIAGNOSIS — E890 Postprocedural hypothyroidism: Secondary | ICD-10-CM

## 2020-04-20 DIAGNOSIS — Z8659 Personal history of other mental and behavioral disorders: Secondary | ICD-10-CM | POA: Diagnosis not present

## 2020-04-20 DIAGNOSIS — Z6841 Body Mass Index (BMI) 40.0 and over, adult: Secondary | ICD-10-CM | POA: Diagnosis not present

## 2020-04-20 NOTE — Patient Instructions (Addendum)
Please use your cane while in the house.   Fall Prevention in the Home, Adult Falls can cause injuries. They can happen to people of all ages. There are many things you can do to make your home safe and to help prevent falls. Ask for help when making these changes, if needed. What actions can I take to prevent falls? General Instructions  Use good lighting in all rooms. Replace any light bulbs that burn out.  Turn on the lights when you go into a dark area. Use night-lights.  Keep items that you use often in easy-to-reach places. Lower the shelves around your home if necessary.  Set up your furniture so you have a clear path. Avoid moving your furniture around.  Do not have throw rugs and other things on the floor that can make you trip.  Avoid walking on wet floors.  If any of your floors are uneven, fix them.  Add color or contrast paint or tape to clearly mark and help you see: ? Any grab bars or handrails. ? First and last steps of stairways. ? Where the edge of each step is.  If you use a stepladder: ? Make sure that it is fully opened. Do not climb a closed stepladder. ? Make sure that both sides of the stepladder are locked into place. ? Ask someone to hold the stepladder for you while you use it.  If there are any pets around you, be aware of where they are. What can I do in the bathroom?      Keep the floor dry. Clean up any water that spills onto the floor as soon as it happens.  Remove soap buildup in the tub or shower regularly.  Use non-skid mats or decals on the floor of the tub or shower.  Attach bath mats securely with double-sided, non-slip rug tape.  If you need to sit down in the shower, use a plastic, non-slip stool.  Install grab bars by the toilet and in the tub and shower. Do not use towel bars as grab bars. What can I do in the bedroom?  Make sure that you have a light by your bed that is easy to reach.  Do not use any sheets or blankets  that are too big for your bed. They should not hang down onto the floor.  Have a firm chair that has side arms. You can use this for support while you get dressed. What can I do in the kitchen?  Clean up any spills right away.  If you need to reach something above you, use a strong step stool that has a grab bar.  Keep electrical cords out of the way.  Do not use floor polish or wax that makes floors slippery. If you must use wax, use non-skid floor wax. What can I do with my stairs?  Do not leave any items on the stairs.  Make sure that you have a light switch at the top of the stairs and the bottom of the stairs. If you do not have them, ask someone to add them for you.  Make sure that there are handrails on both sides of the stairs, and use them. Fix handrails that are broken or loose. Make sure that handrails are as long as the stairways.  Install non-slip stair treads on all stairs in your home.  Avoid having throw rugs at the top or bottom of the stairs. If you do have throw rugs, attach them to the  floor with carpet tape.  Choose a carpet that does not hide the edge of the steps on the stairway.  Check any carpeting to make sure that it is firmly attached to the stairs. Fix any carpet that is loose or worn. What can I do on the outside of my home?  Use bright outdoor lighting.  Regularly fix the edges of walkways and driveways and fix any cracks.  Remove anything that might make you trip as you walk through a door, such as a raised step or threshold.  Trim any bushes or trees on the path to your home.  Regularly check to see if handrails are loose or broken. Make sure that both sides of any steps have handrails.  Install guardrails along the edges of any raised decks and porches.  Clear walking paths of anything that might make someone trip, such as tools or rocks.  Have any leaves, snow, or ice cleared regularly.  Use sand or salt on walking paths during  winter.  Clean up any spills in your garage right away. This includes grease or oil spills. What other actions can I take?  Wear shoes that: ? Have a low heel. Do not wear high heels. ? Have rubber bottoms. ? Are comfortable and fit you well. ? Are closed at the toe. Do not wear open-toe sandals.  Use tools that help you move around (mobility aids) if they are needed. These include: ? Canes. ? Walkers. ? Scooters. ? Crutches.  Review your medicines with your doctor. Some medicines can make you feel dizzy. This can increase your chance of falling. Ask your doctor what other things you can do to help prevent falls. Where to find more information  Centers for Disease Control and Prevention, STEADI: https://garcia.biz/  [employer] on Aging: BrainJudge.co.uk Contact a doctor if:  You are afraid of falling at home.  You feel weak, drowsy, or dizzy at home.  You fall at home. Summary  There are many simple things that you can do to make your home safe and to help prevent falls.  Ways to make your home safe include removing tripping hazards and installing grab bars in the bathroom.  Ask for help when making these changes in your home. This information is not intended to replace advice given to you by your health care provider. Make sure you discuss any questions you have with your health care provider. Document Revised: 10/08/2018 Document Reviewed: 01/30/2017 Elsevier Patient Education  2020 [employer].

## 2020-04-20 NOTE — Progress Notes (Signed)
Patient ID: Melinda [name], female    DOB: 1965/10/16  MRN: [id number]  CC: Hypertension and Diabetes   Subjective: Melinda [name] is a 54 y.o. female who presents for chronic ds management Her concerns today include:  Pt with hxofDM(followed by Dr. Jana Hakim, moderate persistent asthma,migraines followed by neurology, uterine fibroids followed by GYN, papillary thyroid CA s/pthyroidectomy, history of DVT/PE onEliquislifetime,obesity, PTSD/anxiety.  CP: Referred to Dr. Alvester Chou by our PA post ER visit for chest pain and EKG with prolonged QT interval.  Seen by Dr. Alvester Chou.  His overall assessment was that the chest pain was atypical.  HTN:  Compliant with HCTZ and Metoprolol Checks BP a few times a wk.  Range has been good  DM:  Still followed by Dr. Buddy Duty.  On last visit with him about 6 wks ago, she was started on Trulicity to help decrease her appetite and help with wgh loss.  She has found it helpful in decreasing her appetite -trying to make good food choices - eating more veggies like greens, spinach, carrots. Can not tolerate wheat bread. -had eye exam 01/2020.  Got new glasses -endorses intermittent numbness and tingling in feet  Hx of thyroid [place]/Hypothyroid:  Reports Dr. Buddy Duty has been following.  On Levothyroxine 200 mcg  Pos falls risk:  Fell 2-3 times this yr. Last one was in the summer when she fell out of bed and re-injured her RT shoulder.  Did P.T and found it helpful.  She ambulates with a Rollator walker.  Her hall way is small so she cannot use the walker when walking down the halls.  However she does have a cane but has not been using that consistently in her house.  Pos Dep Screen/Anxiety:  Sees her psychiatrist (Dr. Stephannie Peters - [facility]) Q 3 mths.  No longer sees a Social worker.  Doing well on Wellbutrin, Buspar, Celexa and Prazosin.  Lab Results  Component Value Date   HGBA1C 6.0 (A) 01/19/2020     Patient Active Problem List    Diagnosis Date Noted  . Hyperlipidemia 02/16/2020  . Adrenal mass, right ([place]) 04/29/2019  . Chronic cough 03/12/2018  . Left shoulder pain 10/14/2016  . Hypertension 10/14/2016  . S/P thyroidectomy 06/27/2016  . Chronic bilateral low back pain without sciatica 04/22/2016  . Liver lesion 12/24/2015  . Papillary thyroid carcinoma ([place]) 12/21/2015  . Controlled type 2 diabetes mellitus with complication, with long-term current use of insulin ([place]) 10/10/2015  . Vision loss 08/25/2015  . Decreased vision in both eyes 08/25/2015  . Prolonged Q-T interval on ECG 08/19/2015  . History of pulmonary embolism 08/10/2015  . Mild persistent asthma 04/13/2015  . Fibroid, uterine   . Morbid obesity ([place]) 11/11/2014  . Atypical chest pain 11/08/2014  . Sinus tachycardia 11/08/2014  . Migraine 10/14/2014  . Menorrhagia 09/08/2014     Current Outpatient Medications on File Prior to Visit  Medication Sig Dispense Refill  . albuterol (PROVENTIL) (2.5 MG/3ML) 0.083% nebulizer solution Take 3 mLs (2.5 mg total) by nebulization every 6 (six) hours as needed for wheezing or shortness of breath. 3 mL 3  . atorvastatin (LIPITOR) 20 MG tablet TAKE ONE TABLET BY MOUTH ONCE DAILY (BEDTIME) 30 tablet 2  . budesonide-formoterol (SYMBICORT) 160-4.5 MCG/ACT inhaler Inhale 2 puffs into the lungs 2 (two) times daily. 2 Inhaler 0  . buPROPion (WELLBUTRIN XL) 300 MG 24 hr tablet Take 300 mg by mouth daily.   2  . busPIRone (BUSPAR) 5 MG tablet  Take 5 mg by mouth 3 (three) times daily.    . citalopram (CELEXA) 40 MG tablet Take 40 mg by mouth daily.   2  . cyclobenzaprine (FLEXERIL) 10 MG tablet Take 1 tablet (10 mg total) by mouth 3 (three) times daily as needed for muscle spasms. 30 tablet 0  . doxepin (SINEQUAN) 10 MG capsule Take 10 mg by mouth.    Arne Cleveland 5 MG TABS tablet TAKE ONE TABLET BY MOUTH TWICE DAILY MORNING AND EVENING 60 tablet 2  . fluticasone (CUTIVATE) 0.05 % cream Apply topically 2 (two) times  daily. 30 g 2  . gabapentin (NEURONTIN) 300 MG capsule Take 1 capsule (300 mg total) by mouth 3 (three) times daily. 90 capsule 3  . hydrochlorothiazide (MICROZIDE) 12.5 MG capsule TAKE ONE CAPSULE BY MOUTH ONCE DAILY(AM) 30 capsule 2  . loratadine (CLARITIN) 10 MG tablet Take 1 tablet (10 mg total) by mouth daily. 30 tablet 11  . metoprolol tartrate (LOPRESSOR) 50 MG tablet TAKE ONE TABLET BY MOUTH TWICE DAILY 60 tablet 2  . montelukast (SINGULAIR) 10 MG tablet TAKE 1 TABLET BY MOUTH EVERY EVENING AT BEDTIME 30 tablet 2  . PAZEO 0.7 % SOLN     . prazosin (MINIPRESS) 1 MG capsule Take 5 mg by mouth at bedtime.     Marland Kitchen SYNTHROID 200 MCG tablet 212 mcg.     . topiramate (TOPAMAX) 200 MG tablet Take 1 tablet (200 mg total) by mouth at bedtime. 90 tablet 3  . TRULICITY 1.60 FU/9.3AT SOPN Inject into the skin.     No current facility-administered medications on file prior to visit.    Allergies  Allergen Reactions  . Ceftriaxone Anaphylaxis    ROCEPHIN  . Penicillins Shortness Of Breath    Has patient had a PCN reaction causing immediate rash, facial/tongue/throat swelling, SOB or lightheadedness with hypotension: Yes Has patient had a PCN reaction causing severe rash involving mucus membranes or skin necrosis: No Has patient had a PCN reaction that required hospitalization No Has patient had a PCN reaction occurring within the last 10 years: No If all of the above answers are "NO", then may proceed with Cephalosporin use.   . Shellfish Allergy Anaphylaxis  . Flonase [Fluticasone Propionate]     Makes migraines worse  . Gold-Containing Drug Products     HANDS ITCH  . [place]     HANDS SWELL  . [place]rus Rash    Social History   Socioeconomic History  . Marital status: Single    Spouse name: Not on file  . Number of children: 1  . Years of education: 58  . Highest education level: Some college, no degree  Occupational History  . Occupation: disabled  Tobacco Use  . Smoking status:  Former Smoker    Packs/day: 0.50    Years: 40.00    Pack years: 20.00    Types: Cigarettes    Quit date: 11/03/2014    Years since quitting: 5.4  . Smokeless tobacco: Never Used  Vaping Use  . Vaping Use: Never used  Substance and Sexual Activity  . Alcohol use: No    Alcohol/week: 0.0 standard drinks  . Drug use: No  . Sexual activity: Never    Birth control/protection: None  Other Topics Concern  . Not on file  Social History Narrative   Patient is right-handed. She lives in a one level handicap accessible apartment. She avoids caffeine. She uses stationary stationary pedals to use for exercise.   Social  Determinants of Health   Financial Resource Strain:   . Difficulty of Paying Living Expenses: Not on file  Food Insecurity:   . Worried About Charity fundraiser in the Last Year: Not on file  . Ran Out of Food in the Last Year: Not on file  Transportation Needs:   . Lack of Transportation (Medical): Not on file  . Lack of Transportation (Non-Medical): Not on file  Physical Activity:   . Days of Exercise per Week: Not on file  . Minutes of Exercise per Session: Not on file  Stress:   . Feeling of Stress : Not on file  Social Connections:   . Frequency of Communication with Friends and Family: Not on file  . Frequency of Social Gatherings with Friends and Family: Not on file  . Attends Religious Services: Not on file  . Active Member of Clubs or Organizations: Not on file  . Attends Archivist Meetings: Not on file  . Marital Status: Not on file  Intimate Partner Violence:   . Fear of Current or Ex-Partner: Not on file  . Emotionally Abused: Not on file  . Physically Abused: Not on file  . Sexually Abused: Not on file    Family History  Problem Relation Age of Onset  . Diabetes Mother   . Breast cancer Mother   . CAD Mother   . Hypertension Mother   . Alcohol abuse Father   . Hypertension Father   . Breast cancer Maternal Aunt   . Breast cancer  Maternal Aunt     Past Surgical History:  Procedure Laterality Date  . ANGIOGRAM TO LEG  08-13-15   RIGHT  . CHOLECYSTECTOMY    . IR GENERIC HISTORICAL  04/04/2016   IR RADIOLOGIST EVAL & MGMT 04/04/2016 [name], MD [facility] INTERV RAD  . THYROIDECTOMY N/A 11/17/2015   Procedure: TOTAL THYROIDECTOMY;  Surgeon: Armandina Gemma, MD;  Location: [facility] ORS;  Service: General;  Laterality: N/A;  . UTERINE ARTERY EMBOLIZATION Bilateral 08/13/2015    ROS: Review of Systems Negative except as stated above  PHYSICAL EXAM: BP 117/78   Pulse 80   Resp 16   Wt (!) 329 lb 12.8 oz (149.6 kg)   SpO2 97%   BMI 47.32 kg/m   Wt Readings from Last 3 Encounters:  04/20/20 (!) 329 lb 12.8 oz (149.6 kg)  02/16/20 (!) 332 lb (150.6 kg)  01/19/20 (!) 324 lb (147 kg)    Physical Exam  General appearance - alert, well appearing, and in no distress Mental status - normal mood, behavior, speech, dress, motor activity, and thought processes Mouth - mucous membranes moist, pharynx normal without lesions Neck - supple, no significant adenopathy Chest - clear to auscultation, no wheezes, rales or rhonchi, symmetric air entry Heart - normal rate, regular rhythm, normal S1, S2, no murmurs, rubs, clicks or gallops Extremities - peripheral pulses normal, no pedal edema, no clubbing or cyanosis MSK: Patient ambulates with a Rollator walker.  Gait is slowed with decreased foot to floor clearance. Diabetic Foot Exam - Simple   Simple Foot Form Visual Inspection See comments: Yes Sensation Testing Intact to touch and monofilament testing bilaterally: Yes Pulse Check Posterior Tibialis and Dorsalis pulse intact bilaterally: Yes Comments Both the dorsal and plantar surface of both feet are dry with flaking skin.     Depression screen [facility] 2/9 04/20/2020 01/19/2020 01/19/2020  Decreased Interest 1 1 0  Down, Depressed, Hopeless 1 0 0  PHQ - 2  Score 2 1 0  Altered sleeping 0 0 0  Tired, decreased energy 0 3 0    Change in appetite 0 2 0  Feeling bad or failure about yourself  0 0 0  Trouble concentrating 3 1 0  Moving slowly or fidgety/restless 3 1 0  Suicidal thoughts 0 0 0  PHQ-9 Score 8 8 0  Difficult doing work/chores - Not difficult at all -  Some recent data might be hidden    CMP Latest Ref Rng & Units 12/30/2019 04/29/2019 08/28/2018  Glucose 70 - 99 mg/dL 128(H) 113(H) 112(H)  BUN 6 - 20 mg/dL 10 15 11   Creatinine 0.44 - 1.00 mg/dL 0.85 1.07(H) 0.78  Sodium 135 - 145 mmol/L 140 142 140  Potassium 3.5 - 5.1 mmol/L 3.4(L) 3.7 4.2  Chloride 98 - 111 mmol/L 112(H) 107(H) 104  CO2 22 - 32 mmol/L 20(L) 22 19(L)  Calcium 8.9 - 10.3 mg/dL 8.3(L) 9.1 9.0  Total Protein 6.0 - 8.5 g/dL - 7.1 6.4  Total Bilirubin 0.0 - 1.2 mg/dL - 0.3 0.4  Alkaline Phos 39 - 117 IU/L - 135(H) 100  AST 0 - 40 IU/L - 11 22  ALT 0 - 32 IU/L - 12 25   Lipid Panel     Component Value Date/Time   CHOL 156 04/29/2019 1106   TRIG 125 04/29/2019 1106   HDL 52 04/29/2019 1106   CHOLHDL 3.0 04/29/2019 1106   CHOLHDL 2.7 09/08/2014 0147   VLDL 18 09/08/2014 0147   LDLCALC 82 04/29/2019 1106    CBC    Component Value Date/Time   WBC 10.1 12/30/2019 1530   RBC 4.86 12/30/2019 1530   HGB 14.2 12/30/2019 1530   HGB 14.7 04/29/2019 1106   HCT 44.0 12/30/2019 1530   HCT 45.4 04/29/2019 1106   PLT 282 12/30/2019 1530   PLT 289 04/29/2019 1106   MCV 90.5 12/30/2019 1530   MCV 90 04/29/2019 1106   MCH 29.2 12/30/2019 1530   MCHC 32.3 12/30/2019 1530   RDW 13.5 12/30/2019 1530   RDW 14.0 04/29/2019 1106   LYMPHSABS 1.9 02/18/2018 2337   MONOABS 0.6 02/18/2018 2337   EOSABS 0.1 02/18/2018 2337   BASOSABS 0.0 02/18/2018 2337    ASSESSMENT AND PLAN: 1. Class 3 severe obesity due to excess calories with serious comorbidity and body mass index (BMI) of 45.0 to 49.9 in adult [facility]) Patient has lost 3 pounds so far since being on Trulicity.  Dietary counseling given.  Encouraged him as much as she is able  to.  2. Type 2 diabetes mellitus with peripheral neuropathy ([place]) Followed by endocrinology.  Recently started on Trulicity more so to help with weight loss.  We will check an A1c today.  Dietary counseling was discussed in #1 above. - Lipid panel - Hepatic function panel - Microalbumin / creatinine urine ratio - Hemoglobin A1c  3. Essential hypertension At goal.  Continue HCTZ and metoprolol and low-salt diet  4. Postoperative hypothyroidism Followed by endocrinology.  Synthroid.  5. Positive depression screening Patient plugged into mental health services and has a psychiatrist.  6. History of posttraumatic stress disorder (PTSD) See #5 above  7. At high risk for falls Patient has had physical therapy in the past and several times this year for her shoulder and also for gait training.  She has a Teaching laboratory technician.  I have encouraged her to use her cane in areas of her house where the Rollator walker is too large to  maneuver the hallways.  Printed information on fall prevention given.  8. Need for hepatitis C screening test - Hepatitis C Antibody   Patient was given the opportunity to ask questions.  Patient verbalized understanding of the plan and was able to repeat key elements of the plan.   Orders Placed This Encounter  Procedures  . Lipid panel  . Hepatic function panel  . Microalbumin / creatinine urine ratio  . Hepatitis C Antibody  . Hemoglobin A1c     Requested Prescriptions    No prescriptions requested or ordered in this encounter    Return in about 4 months (around 08/21/2020).  [name], MD, FACP

## 2020-04-21 LAB — HEPATIC FUNCTION PANEL
ALT: 26 IU/L (ref 0–32)
AST: 14 IU/L (ref 0–40)
Albumin: 4.2 g/dL (ref 3.8–4.9)
Alkaline Phosphatase: 136 IU/L — ABNORMAL HIGH (ref 44–121)
Bilirubin Total: 0.4 mg/dL (ref 0.0–1.2)
Bilirubin, Direct: 0.15 mg/dL (ref 0.00–0.40)
Total Protein: 6.8 g/dL (ref 6.0–8.5)

## 2020-04-21 LAB — HEPATITIS C ANTIBODY: Hep C Virus Ab: <0.1 s/co ratio (ref 0.0–0.9)

## 2020-04-21 LAB — MICROALBUMIN / CREATININE URINE RATIO
Creatinine, Urine: 360.5 mg/dL
Microalb/Creat Ratio: 3 mg/g creat (ref 0–29)
Microalbumin, Urine: 9.7 ug/mL

## 2020-04-21 LAB — LIPID PANEL
Chol/HDL Ratio: 3 ratio (ref 0.0–4.4)
Cholesterol, Total: 130 mg/dL (ref 100–199)
HDL: 44 mg/dL (ref >39)
LDL Chol Calc (NIH): 68 mg/dL (ref 0–99)
Triglycerides: 95 mg/dL (ref 0–149)
VLDL Cholesterol Cal: 18 mg/dL (ref 5–40)

## 2020-04-21 LAB — HEMOGLOBIN A1C
Est. average glucose Bld gHb Est-mCnc: 134 mg/dL
Hgb A1c MFr Bld: 6.3 % — ABNORMAL HIGH (ref 4.8–5.6)

## 2020-04-28 ENCOUNTER — Other Ambulatory Visit: Payer: Self-pay | Admitting: Internal Medicine

## 2020-04-28 DIAGNOSIS — S46011D Strain of muscle(s) and tendon(s) of the rotator cuff of right shoulder, subsequent encounter: Secondary | ICD-10-CM

## 2020-04-28 NOTE — Telephone Encounter (Signed)
Requested medication (s) are due for refill today: yes  Requested medication (s) are on the active medication list: yes   Last refill:  02/18/2020  Future visit scheduled: yes   Notes to clinic:  this refill cannot be delegated    Requested Prescriptions  Pending Prescriptions Disp Refills   cyclobenzaprine (FLEXERIL) 10 MG tablet [Pharmacy Med Name: CYCLOBENZAPRINE HCL 10MG  TABLET] 30 tablet 0    Sig: Take 1 tablet (10 mg total) by mouth 3 (three) times daily as needed for muscle spasms.      Not Delegated - Analgesics:  Muscle Relaxants Failed - 04/28/2020  1:25 PM      Failed - This refill cannot be delegated      Passed - Valid encounter within last 6 months    Recent Outpatient Visits           1 week ago Class 3 severe obesity due to excess calories with serious comorbidity and body mass index (BMI) of 45.0 to 49.9 in adult [facility])   [place], Neoma Laming B, MD   3 months ago Controlled type 2 diabetes mellitus with complication, with long-term current use of insulin [facility])   [place], [place], [name]   6 months ago Controlled type 2 diabetes mellitus with complication, without long-term current use of insulin [facility])   [place], MD   11 months ago Pap smear for cervical cancer screening   [place], MD   1 year ago Chronic bilateral low back pain without sciatica   [place], Deborah B, MD       Future Appointments             In 3 months Wynetta Emery, Dalbert Batman, MD [place]

## 2020-05-12 ENCOUNTER — Other Ambulatory Visit: Payer: Self-pay | Admitting: Physician Assistant

## 2020-05-12 DIAGNOSIS — S46011D Strain of muscle(s) and tendon(s) of the rotator cuff of right shoulder, subsequent encounter: Secondary | ICD-10-CM

## 2020-05-17 DIAGNOSIS — F331 Major depressive disorder, recurrent, moderate: Secondary | ICD-10-CM | POA: Diagnosis not present

## 2020-05-17 DIAGNOSIS — F411 Generalized anxiety disorder: Secondary | ICD-10-CM | POA: Diagnosis not present

## 2020-05-17 DIAGNOSIS — F4310 Post-traumatic stress disorder, unspecified: Secondary | ICD-10-CM | POA: Diagnosis not present

## 2020-05-17 NOTE — Progress Notes (Signed)
NEUROLOGY FOLLOW UP OFFICE NOTE  Melinda [name] [id number]   Subjective:  Melinda [name] is a 54 year old right-handed African-American woman with type 2 diabetes mellitus, glaucoma, asthma, chronic low back pain, PTSD, PE/DVT and history of thyroid cancer status post thyroidectomy who follows up for migraines.  UPDATE: She reports increased headache since the summer, different from her migraines.  She gets an irritating dull headache usually in morning but also at night.  She feels congested.  It feels like a head cold.  Eyes tear, ears feel clogged.  She tried less computer or TV screen time but not helpful.  Almost daily now.  May take Tylenol but not frequent.   Her son had a pancreas and kidney transplant this past summer due to history of necrotizing pancreatitis at age 30.    Migraines remain well-controlled. Intensity: Moderate Duration: 1 hour Frequency: 3 days per month Frequency of abortive medication:Has not been treating migraines. Current NSAIDS:Contraindicated (on anticoagulation) Current analgesics:Tramadol Current triptans:none Current ergotamine:none Current anti-emetic:Zofran ODT 4mg  Current muscle relaxants:Flexeril Current anti-anxiolytic:none Current sleep aide:none Current Antihypertensive medications:Metoprolol 50mg  twice daily, HCTZ Current Antidepressant medications:Wellbutrin 150mg , citalopram 10mg , doxepine Current Anticonvulsant medications:topiramate200 mg at bedtime, gabapentin 100mg  three times daily Current anti-CGRP:None Current Vitamins/Herbal/Supplements:Ferrous sulfate Current Antihistamines/Decongestants:Claritin, Benadryl Other therapy:Essential oils, lavenderSees psychiatrist and psychologist which helps  Caffeine:No Alcohol:No Smoker:No Diet:Hydrates. No soda. Eats baked and boiled chicken, mostly vegetables. Little red meat only during menses. No fried foods. Exercise:Stationary bike, limited  due to chronic pain Depression:Yes; Anxiety:Yes Other pain:Chronic pain Sleep hygiene:Poor. Has night terrors secondary to PTSD.Sleep study last week showed no sleep apnea.  HISTORY: Onset: Since her 5s Location: Right sided Quality: squeezing Initial Intensity: 8.5/10 (sometimes 10/10) Aura: Sometimes preceded with visual aura (sees a pinwheel) Prodrome: no Associated symptoms:Nausea, photophobia, phonophobia. No worse headache of life, change in quality, wakes her up from sleep. Initial Duration: 3 hours to all day Initial Frequency: daily Triggers: Apples,aged cheese, menstrual cycle Relieving factors: None Activity: Light activity aggravates  Past NSAIDS: ibuprofen Past analgesics: Excedrin Past abortive triptans: Sumatriptan tablet/NS/[place], rizatriptan 10mg  Past muscle relaxants: no Past anti-emetic: no Past antihypertensive medications: no Past antidepressant medications: amitriptyline 50mg  Past anticonvulsant medications: no Past vitamins/Herbal/Supplements: no Past antihistamines/decongestants: no Other past therapies: no  Family history of headache: Son has migraines.  CT of head from 01/16/15 was unremarkable.  In September 2018, she developed a "thunderclap" headache. Onset was over a month ago. It lasts for a couple of seconds and occurs once a week. She has not had any vision loss, unilateral numbness or weakness or vertigo.  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 Repeat CTA of head on 02/25/2019 was stable  PAST MEDICAL HISTORY: Past Medical History:  Diagnosis Date  . ADHD (attention deficit hyperactivity disorder)   . Arthritis   . Asthma   . Cancer ([place])   . Chicken pox AGE 66  . COPD (chronic obstructive pulmonary disease) ([place])    pt reported  . Diabetes mellitus  without complication ([place])   . Diverticulosis, sigmoid   . GERD (gastroesophageal reflux disease)   . Glaucoma    BOTH EYES, NO EYE DROPS  . Glaucoma   . History of blood transfusion MARCH 2016    2UNITS GIVEN AND IRON GIVEN  . Hyperglycemia 09/09/2014  . Hypertension   . 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 Iron deficiency anemia due to chronic blood loss 09/09/2014  . Low TSH level 09/08/2014  . Lumbar herniated disc   . Menorrhagia 09/08/2014  . Migraine    CLUSTER AND MIGRAINES  . Multiple thyroid nodules   . Ovarian mass, right 09/09/2014  . PE (pulmonary embolism) 2016  . Peripheral neuropathy    FINGER TIPS AND TOES NUMB SOME  . Preeclampsia  1983   WITH PREGNANCY  . PTSD (post-traumatic stress disorder)   . Scoliosis   . Seizures ([place]) AGE 44   NONE SINCE, HAD CHICKEN POX THEN    MEDICATIONS: Current Outpatient Medications on File Prior to Visit  Medication Sig Dispense Refill  . albuterol (PROVENTIL) (2.5 MG/3ML) 0.083% nebulizer solution Take 3 mLs (2.5 mg total) by nebulization every 6 (six) hours as needed for wheezing or shortness of breath. 3 mL 3  . atorvastatin (LIPITOR) 20 MG tablet TAKE ONE TABLET BY MOUTH ONCE DAILY (BEDTIME) 30 tablet 2  . budesonide-formoterol (SYMBICORT) 160-4.5 MCG/ACT inhaler Inhale 2 puffs into the lungs 2 (two) times daily. 2 Inhaler 0  . buPROPion (WELLBUTRIN XL) 300 MG 24 hr tablet Take 300 mg by mouth daily.   2  . busPIRone (BUSPAR) 5 MG tablet Take 5 mg by mouth 3 (three) times daily.    . citalopram (CELEXA) 40 MG tablet Take 40 mg by mouth daily.   2  . cyclobenzaprine (FLEXERIL) 10 MG tablet TAKE 1 TABLET (10 MG TOTAL) BY MOUTH 3 (THREE) TIMES DAILY AS NEEDED FOR MUSCLE SPASMS. 30 tablet 1  . doxepin (SINEQUAN) 10 MG capsule Take 10 mg by mouth.    Arne Cleveland 5 MG TABS tablet TAKE ONE TABLET BY MOUTH TWICE DAILY MORNING AND EVENING 60 tablet 2  . fluticasone  (CUTIVATE) 0.05 % cream Apply topically 2 (two) times daily. 30 g 2  . gabapentin (NEURONTIN) 300 MG capsule TAKE 1 CAPSULE (300 MG TOTAL) BY MOUTH 3 (THREE) TIMES DAILY. 90 capsule 3  . hydrochlorothiazide (MICROZIDE) 12.5 MG capsule TAKE ONE CAPSULE BY MOUTH ONCE DAILY(AM) 30 capsule 2  . loratadine (CLARITIN) 10 MG tablet Take 1 tablet (10 mg total) by mouth daily. 30 tablet 11  . metoprolol tartrate (LOPRESSOR) 50 MG tablet TAKE ONE TABLET BY MOUTH TWICE DAILY 60 tablet 2  . montelukast (SINGULAIR) 10 MG tablet TAKE 1 TABLET BY MOUTH EVERY EVENING AT BEDTIME 30 tablet 2  . PAZEO 0.7 % SOLN     . prazosin (MINIPRESS) 1 MG capsule Take 5 mg by mouth at bedtime.     Marland Kitchen SYNTHROID 200 MCG tablet 212 mcg.     . topiramate (TOPAMAX) 200 MG tablet Take 1 tablet (200 mg total) by mouth at bedtime. 90 tablet 3  . TRULICITY 6.21 HY/8.6VH SOPN Inject into the skin.     No current facility-administered medications on file prior to visit.    ALLERGIES: Allergies  Allergen Reactions  . Ceftriaxone Anaphylaxis    ROCEPHIN  . Penicillins Shortness Of Breath    Has patient had a PCN reaction causing immediate rash, facial/tongue/throat swelling, SOB or lightheadedness with hypotension: Yes Has patient had a PCN reaction causing severe rash involving mucus membranes or skin necrosis: No Has patient had a PCN reaction that required hospitalization No Has patient had a PCN reaction occurring within the last 10 years: No If all of the above answers are "NO", then may proceed with Cephalosporin use.   Marland Kitchen  Shellfish Allergy Anaphylaxis  . Flonase [Fluticasone Propionate]     Makes migraines worse  . Gold-Containing Drug Products     HANDS ITCH  . [place]     HANDS SWELL  . Citrus Rash    FAMILY HISTORY: Family History  Problem Relation Age of Onset  . Diabetes Mother   . Breast cancer Mother   . CAD Mother   . Hypertension Mother   . Alcohol abuse Father   . Hypertension Father   . Breast  cancer Maternal Aunt   . Breast cancer Maternal Aunt    SOCIAL HISTORY: Social History   Socioeconomic History  . Marital status: Single    Spouse name: Not on file  . Number of children: 1  . Years of education: 39  . Highest education level: Some college, no degree  Occupational History  . Occupation: disabled  Tobacco Use  . Smoking status: Former Smoker    Packs/day: 0.50    Years: 40.00    Pack years: 20.00    Types: Cigarettes    Quit date: 11/03/2014    Years since quitting: 5.5  . Smokeless tobacco: Never Used  Vaping Use  . Vaping Use: Never used  Substance and Sexual Activity  . Alcohol use: No    Alcohol/week: 0.0 standard drinks  . Drug use: No  . Sexual activity: Never    Birth control/protection: None  Other Topics Concern  . Not on file  Social History Narrative   Patient is right-handed. She lives in a one level handicap accessible apartment. She avoids caffeine. She uses stationary stationary pedals to use for exercise.   Social Determinants of Health   Financial Resource Strain:   . Difficulty of Paying Living Expenses: Not on file  Food Insecurity:   . Worried About Charity fundraiser in the Last Year: Not on file  . Ran Out of Food in the Last Year: Not on file  Transportation Needs:   . Lack of Transportation (Medical): Not on file  . Lack of Transportation (Non-Medical): Not on file  Physical Activity:   . Days of Exercise per Week: Not on file  . Minutes of Exercise per Session: Not on file  Stress:   . Feeling of Stress : Not on file  Social Connections:   . Frequency of Communication with Friends and Family: Not on file  . Frequency of Social Gatherings with Friends and Family: Not on file  . Attends Religious Services: Not on file  . Active Member of Clubs or Organizations: Not on file  . Attends Archivist Meetings: Not on file  . Marital Status: Not on file  Intimate Partner Violence:   . Fear of Current or Ex-Partner:  Not on file  . Emotionally Abused: Not on file  . Physically Abused: Not on file  . Sexually Abused: Not on file     Objective:  Blood pressure 126/80, pulse 95, resp. rate 20, height 5\' 10"  (1.778 m), weight (!) 334 lb (151.5 kg), SpO2 96 %. General: No acute distress.  Patient appears well-groomed.   Head:  Normocephalic/atraumatic Eyes:  Fundi examined but not visualized Neck: supple, no paraspinal tenderness, full range of motion Heart:  Regular rate and rhythm Lungs:  Clear to auscultation bilaterally Back: No paraspinal tenderness Neurological Exam: alert and oriented to person, place, and time. Attention span and concentration intact, recent and remote memory intact, fund of knowledge intact.  Speech fluent and not dysarthric, language intact.  Endorses right V1-V3 sensory loss.  Otherwise, CN II-XII intact. Bulk and tone normal, limited range and strength of right upper extremity due to rotator cuff injury, otherwise muscle strength 5/5 throughout.  Sensation to light touch  intact.  Deep tendon reflexes 2+ throughout, toes downgoing.  Finger to nose  testing intact.  Gait antalgic.   Assessment/Plan:   1.  Migraine without aura, without status migrainosus, not intractable 2.  Headaches associated with upper respiratory congestion - may be allergies. Takes Claritin. 3.  Cerebral aneurysm 4.  Subjective unilateral facial numbness.  Off and on over several years.  Previously she has reported right sided facial sensation.  Unclear etiology but do not suspect stroke or other primary neurologic etiology.  1.  Migraine prevention:  topiramate 200mg  at bedtime 2.  Migraine rescue: Tylenol 3.  Limit use of pain relievers to no more than 2 days out of week to prevent risk of rebound or medication-overuse headache. 4.  Keep headache diary 5.  May want to follow up with PCP to discuss alternative treatments for allergy. 6.  Repeat CTA of head to follow up on aneurysm.  If stable, would  repeat in 5 to 10 years. 7.  Follow up one year.  Metta Clines, DO  CC: [name], MD

## 2020-05-18 ENCOUNTER — Other Ambulatory Visit: Payer: Self-pay

## 2020-05-18 ENCOUNTER — Ambulatory Visit: Payer: Medicaid Other | Admitting: Neurology

## 2020-05-18 ENCOUNTER — Encounter: Payer: Self-pay | Admitting: Neurology

## 2020-05-18 VITALS — BP 126/80 | HR 95 | Resp 20 | Ht 70.0 in | Wt 334.0 lb

## 2020-05-18 DIAGNOSIS — G43009 Migraine without aura, not intractable, without status migrainosus: Secondary | ICD-10-CM | POA: Diagnosis not present

## 2020-05-18 DIAGNOSIS — I671 Cerebral aneurysm, nonruptured: Secondary | ICD-10-CM

## 2020-05-18 NOTE — Patient Instructions (Addendum)
1.  Continue topiramate 2.  Check CTA of head to follow up on tiny aneurysm. We have sent a referral to [facility] for your MRI and they will call you directly to schedule your appointment. They are located at [place]. If you need to contact them directly please call [phone].  3.  Follow up in one year

## 2020-06-09 ENCOUNTER — Ambulatory Visit
Admission: RE | Admit: 2020-06-09 | Discharge: 2020-06-09 | Disposition: A | Discharge status: Home / Self Care | Payer: Medicaid Other | Source: Ambulatory Visit | Attending: Neurology | Admitting: Neurology

## 2020-06-09 ENCOUNTER — Other Ambulatory Visit: Payer: Self-pay | Admitting: Internal Medicine

## 2020-06-09 DIAGNOSIS — I771 Stricture of artery: Secondary | ICD-10-CM | POA: Diagnosis not present

## 2020-06-09 DIAGNOSIS — E118 Type 2 diabetes mellitus with unspecified complications: Secondary | ICD-10-CM

## 2020-06-09 DIAGNOSIS — I728 Aneurysm of other specified arteries: Secondary | ICD-10-CM | POA: Diagnosis not present

## 2020-06-09 DIAGNOSIS — I10 Essential (primary) hypertension: Secondary | ICD-10-CM

## 2020-06-09 DIAGNOSIS — I671 Cerebral aneurysm, nonruptured: Secondary | ICD-10-CM

## 2020-06-09 DIAGNOSIS — Z794 Long term (current) use of insulin: Secondary | ICD-10-CM

## 2020-06-09 DIAGNOSIS — Z86718 Personal history of other venous thrombosis and embolism: Secondary | ICD-10-CM

## 2020-06-09 DIAGNOSIS — J4550 Severe persistent asthma, uncomplicated: Secondary | ICD-10-CM

## 2020-06-09 DIAGNOSIS — I672 Cerebral atherosclerosis: Secondary | ICD-10-CM | POA: Diagnosis not present

## 2020-06-09 DIAGNOSIS — S46011D Strain of muscle(s) and tendon(s) of the rotator cuff of right shoulder, subsequent encounter: Secondary | ICD-10-CM

## 2020-06-09 MED ORDER — IOPAMIDOL (ISOVUE-370) INJECTION 76%
75.0000 mL | Freq: Once | INTRAVENOUS | Status: AC | PRN
  Administered 2020-06-09: 75 mL via INTRAVENOUS

## 2020-06-09 NOTE — Telephone Encounter (Signed)
Requested medication (s) are due for refill today: Yes  Requested medication (s) are on the active medication list: Yes  Last refill:  04/28/20  Future visit scheduled: Yes  Notes to clinic:  See request.    Requested Prescriptions  Pending Prescriptions Disp Refills   cyclobenzaprine (FLEXERIL) 10 MG tablet [Pharmacy Med Name: CYCLOBENZAPRINE HCL 10 MG ORAL TABLET] 30 tablet 1    Sig: TAKE 1 TABLET (10 MG TOTAL) BY MOUTH 3 (THREE) TIMES DAILY AS NEEDED FOR MUSCLE SPASMS.      Not Delegated - Analgesics:  Muscle Relaxants Failed - 06/09/2020  9:57 AM      Failed - This refill cannot be delegated      Passed - Valid encounter within last 6 months    Recent Outpatient Visits           1 month ago Class 3 severe obesity due to excess calories with serious comorbidity and body mass index (BMI) of 45.0 to 49.9 in adult [facility])   [place], Neoma Laming B, MD   4 months ago Controlled type 2 diabetes mellitus with complication, with long-term current use of insulin [facility])   [place], [place], [name]   7 months ago Controlled type 2 diabetes mellitus with complication, without long-term current use of insulin [facility])   [place], MD   1 year ago Pap smear for cervical cancer screening   [place], [place]   1 year ago Chronic bilateral low back pain without sciatica   [place], MD       Future Appointments             In 2 months Wynetta Emery Dalbert Batman, MD [place]

## 2020-07-07 ENCOUNTER — Other Ambulatory Visit: Payer: Self-pay | Admitting: Neurology

## 2020-07-10 DIAGNOSIS — E1165 Type 2 diabetes mellitus with hyperglycemia: Secondary | ICD-10-CM | POA: Diagnosis not present

## 2020-07-12 ENCOUNTER — Other Ambulatory Visit: Payer: Self-pay

## 2020-07-14 ENCOUNTER — Other Ambulatory Visit: Payer: Self-pay

## 2020-07-14 ENCOUNTER — Other Ambulatory Visit: Payer: Self-pay | Admitting: Obstetrics and Gynecology

## 2020-07-14 NOTE — Patient Instructions (Signed)
Hi Ms. Louissaint, thank you for speaking with me today.  Ms. Reid was given information about Medicaid Managed Care team care coordination services as a part of their [place] Medicaid benefit. [name] verbally consented to engagement with the [facility] Managed Care team.   For questions related to your [facility], please call: [phone] or visit the homepage here: [email or web address]  If you would like to schedule transportation through your [facility], please call the following number at least 2 days in advance of your appointment: [phone]  Ms. Bublitz - following are the goals we discussed in your visit today:  Goals Addressed            This Visit's Progress   . Protect My Health       Timeframe:  Long-Range Goal Priority:  Medium Start Date:    07/14/20                         Expected End Date:     10/12/20                  Follow Up Date 08/14/20   - schedule appointment for vaccines needed due to my age or health - schedule recommended health tests (blood work, mammogram, colonoscopy, pap test) - schedule and keep appointment for annual check-up        Patient verbalizes understanding of instructions provided today.   The Managed Medicaid care management team will reach out to the patient again over the next 30 days.  The patient has been provided with contact information for the Managed Medicaid care management team and has been advised to call with any health related questions or concerns.   Aida Raider RN, BSN [place] Management Coordinator - Managed Medicaid High Risk [phone]  Following is a copy of your plan of care:      Problem Identified: Health Promotion or Disease Self-Management (General Plan of Care)   Priority: Medium  Onset Date: 07/14/2020    Patient Care Plan: General Plan of Care  (Adult)    Problem Identified: Health Promotion or Disease Self-Management (General Plan of Care)     Goal: Self-Management Plan Developed   Start Date: 07/14/2020  Expected End Date: 10/12/2020  This Visit's Progress: On track  Priority: Medium  Note:    Current Barriers:  . Chronic Disease Management support and education needs   Nurse Case Manager Clinical Goal(s):  Marland Kitchen Over the next 90 days, patient will attend all scheduled medical appointments:  . Over the next 30 days, patient will work with CM team pharmacist to review medications.  Interventions:  . Inter-disciplinary care team collaboration (see longitudinal plan of care) . Evaluation of current treatment plan  and patient's adherence to plan as established by provider. . Reviewed medications with patient. Nash Dimmer with pharmacy regarding medications. . Discussed plans with patient for ongoing care management follow up and provided patient with direct contact information for care management team . Reviewed scheduled/upcoming provider appointments. . Pharmacy referral for medication review.  Patient Goals/Self-Care Activities Over the next 90 days, patient will:  -Self administers medications as prescribed Attends all scheduled provider appointments Calls pharmacy for medication refills Calls provider office for new concerns or questions  Follow Up Plan: The Managed Medicaid care management team will reach out to the patient again over the next 30 days.  The patient has  been provided with contact information for the Managed Medicaid care management team and has been advised to call with any health related questions or concerns.

## 2020-07-14 NOTE — Patient Outreach (Signed)
Medicaid Managed Care   Nurse Care Manager Note  07/14/2020 Name:  Melinda [name] MRN:  [id number] DOB:  Feb 23, 1966  Melinda [name] is an 55 y.o. year old female who is a primary patient of [name], MD.  The [facility] Managed Care Coordination team was consulted for assistance with:    chronic healthcare management needs.  Ms. Haspel was given information about Medicaid Managed Care Coordination team services today. Melinda [name] agreed to services and verbal consent obtained.  Engaged with patient by telephone for initial visit in response to provider referral for case management and/or care coordination services.   Assessments/Interventions:  Review of past medical history, allergies, medications, health status, including review of consultants reports, laboratory and other test data, was performed as part of comprehensive evaluation and provision of chronic care management services.  SDOH (Social Determinants of Health) assessments and interventions performed:   Care Plan  Allergies  Allergen Reactions  . Ceftriaxone Anaphylaxis    ROCEPHIN  . Penicillins Shortness Of Breath    Has patient had a PCN reaction causing immediate rash, facial/tongue/throat swelling, SOB or lightheadedness with hypotension: Yes Has patient had a PCN reaction causing severe rash involving mucus membranes or skin necrosis: No Has patient had a PCN reaction that required hospitalization No Has patient had a PCN reaction occurring within the last 10 years: No If all of the above answers are "NO", then [name] proceed with Cephalosporin use.   . Shellfish Allergy Anaphylaxis  . Flonase [Fluticasone Propionate]     Makes migraines worse  . Gold-Containing Drug Products     HANDS ITCH  . Nickel     HANDS SWELL  . Citrus Rash    Medications Reviewed Today    Reviewed by Melinda Medicus, RN (Registered Nurse) on 07/14/20 at 40  Med List Status: <None>  Medication Order Taking? Sig Documenting Provider  Last Dose Status Informant  albuterol (PROVENTIL) (2.5 MG/3ML) 0.083% nebulizer solution [id number] Yes Take 3 mLs (2.5 mg total) by nebulization every 6 (six) hours as needed for wheezing or shortness of breath. Vyctoria, [place], [name] Taking Active   atorvastatin (LIPITOR) 20 MG tablet [id number] Yes TAKE ONE TABLET BY MOUTH ONCE DAILY (BEDTIME) [name], MD Taking Active   budesonide-formoterol [facility]) 160-4.5 MCG/ACT inhaler [id number] Yes Inhale 2 puffs into the lungs 2 (two) times daily. Rain, Fliegel M, PA-C Taking Active   buPROPion (WELLBUTRIN XL) 300 MG 24 hr tablet [id number] Yes Take 300 mg by mouth daily.  [provider] Taking Active Self  busPIRone (BUSPAR) 5 MG tablet [id number] Yes Take 5 mg by mouth 3 (three) times daily. [provider] Taking Active Self           Med Note [name], [facility] I   Wed Feb 18, 2018 10:03 PM)    citalopram (CELEXA) 40 MG tablet [id number] Yes Take 40 mg by mouth daily.  [provider] Taking Active Self  cyclobenzaprine (FLEXERIL) 10 MG tablet [id number] Yes TAKE 1 TABLET (10 MG TOTAL) BY MOUTH 3 (THREE) TIMES DAILY AS NEEDED FOR MUSCLE SPASMS. [name], MD Taking Active   doxepin [facility]) 10 MG capsule [id number] Yes Take 10 mg by mouth. [provider] Taking Active   ELIQUIS 5 MG TABS tablet [id number] Yes TAKE ONE TABLET BY MOUTH TWICE DAILY MORNING AND EVENING [name], MD Taking Active   fluticasone (CUTIVATE) 0.05 % cream [id number] No Apply topically 2 (two) times daily.  Patient not taking: Reported  on 07/14/2020   Boykin Nearing, MD Not Taking Active Self  gabapentin (NEURONTIN) 300 MG capsule [id number] Yes TAKE 1 CAPSULE (300 MG TOTAL) BY MOUTH 3 (THREE) TIMES DAILY. [name], MD Taking Active   hydrochlorothiazide (MICROZIDE) 12.5 MG capsule [id number] Yes TAKE ONE CAPSULE BY MOUTH ONCE DAILY(AM) [name], MD Taking Active   loratadine (CLARITIN) 10  MG tablet [id number] No Take 1 tablet (10 mg total) by mouth daily.  Patient not taking: Reported on 07/14/2020   [name], MD Not Taking Active Self  metoprolol tartrate (LOPRESSOR) 50 MG tablet [id number] Yes TAKE ONE TABLET BY MOUTH TWICE DAILY [name], MD Taking Active   montelukast (SINGULAIR) 10 MG tablet [id number] Yes TAKE 1 TABLET BY MOUTH EVERY EVENING AT BEDTIME [name], MD Taking Active   PAZEO 0.7 % Bailey Mech [id number] Yes  [provider] Taking Active   prazosin (MINIPRESS) 1 MG capsule [id number] Yes Take 5 mg by mouth at bedtime.  [provider] Taking Active Self  SYNTHROID 200 MCG tablet [id number] Yes 212 mcg.  [provider] Taking Active   topiramate (TOPAMAX) 200 MG tablet [id number] Yes TAKE 1 TABLET (200 MG TOTAL) BY MOUTH AT BEDTIME. [name], DO Taking Active   TRULICITY 5.85 ID/7.8EU SOPN [id number] Yes Inject into the skin. [provider] Taking Active           Patient Active Problem List   Diagnosis Date Noted  . At high risk for falls 04/20/2020  . Hyperlipidemia 02/16/2020  . Adrenal mass, right ([place]) 04/29/2019  . Chronic cough 03/12/2018  . Left shoulder pain 10/14/2016  . Hypertension 10/14/2016  . S/P thyroidectomy 06/27/2016  . Chronic bilateral low back pain without sciatica 04/22/2016  . Liver lesion 12/24/2015  . Papillary thyroid carcinoma ([place]) 12/21/2015  . Controlled type 2 diabetes mellitus with complication, with long-term current use of insulin ([place]) 10/10/2015  . Vision loss 08/25/2015  . Decreased vision in both eyes 08/25/2015  . Prolonged Q-T interval on ECG 08/19/2015  . History of pulmonary embolism 08/10/2015  . Mild persistent asthma 04/13/2015  . Fibroid, uterine   . Morbid obesity ([place]) 11/11/2014  . Atypical chest pain 11/08/2014  . Sinus tachycardia 11/08/2014  . Migraine 10/14/2014  . Menorrhagia 09/08/2014    Conditions to be addressed/monitored per PCP  order:  as stated above.          Care Plan : General Plan of Care (Adult)  Updates made by Melinda Medicus, RN since 07/14/2020 12:00 AM    Problem: Health Promotion or Disease Self-Management (General Plan of Care)     Goal: Self-Management Plan Developed   Start Date: 07/14/2020  Expected End Date: 10/12/2020  This Visit's Progress: On track  Priority: Medium  Note:    Current Barriers:  . Chronic Disease Management support and education needs   Nurse Case Manager Clinical Goal(s):  Marland Kitchen Over the next 90 days, patient will attend all scheduled medical appointments:  . Over the next 30 days, patient will work with CM team pharmacist to review medications.  Interventions:  . Inter-disciplinary care team collaboration (see longitudinal plan of care) . Evaluation of current treatment plan  and patient's adherence to plan as established by provider. . Reviewed medications with patient. Nash Dimmer with pharmacy regarding medications. . Discussed plans with patient for ongoing care management follow up and provided patient with direct contact information for care management team . Reviewed scheduled/upcoming provider  appointments. . Pharmacy referral for medication review.  Patient Goals/Self-Care Activities Over the next 90 days, patient will:  -Self administers medications as prescribed Attends all scheduled provider appointments Calls pharmacy for medication refills Calls provider office for new concerns or questions  Follow Up Plan: The Managed Medicaid care management team will reach out to the patient again over the next 30 days.  The patient has been provided with contact information for the Managed Medicaid care management team and has been advised to call with any health related questions or concerns.            Follow Up:  Patient agrees to Care Plan and Follow-up.  Plan: The Managed Medicaid care management team will reach out to the patient again over the next  30 days. and The patient has been provided with contact information for the Managed Medicaid care management team and has been advised to call with any health related questions or concerns.  Date/time of next scheduled RN care management/care coordination outreach:  08/14/20 at 1245.

## 2020-07-21 ENCOUNTER — Other Ambulatory Visit: Payer: Self-pay

## 2020-07-21 NOTE — Patient Outreach (Signed)
Medicaid Managed Care    Pharmacy Note  07/21/2020 Name: Melinda [name] MRN: [id number] DOB: [birth year]  Melinda [name] is a 55 y.o. year old female who is a primary care patient of Melinda Pier, MD. The [facility] Managed Care Coordination team was consulted for assistance with disease management and care coordination needs.    Engaged with patient Engaged with patient by telephone for initial visit in response to referral for case management and/or care coordination services.  Melinda [name] was given information about Managed Medicaid Care Coordination team services today. Melinda [name] agreed to services and verbal consent obtained.   Objective:  Lab Results  Component Value Date   CREATININE 0.85 12/30/2019   CREATININE 1.07 (H) 04/29/2019   CREATININE 0.78 08/28/2018    Lab Results  Component Value Date   HGBA1C 6.3 (H) 04/20/2020       Component Value Date/Time   CHOL 130 04/20/2020 1133   TRIG 95 04/20/2020 1133   HDL 44 04/20/2020 1133   CHOLHDL 3.0 04/20/2020 1133   CHOLHDL 2.7 09/08/2014 0147   VLDL 18 09/08/2014 0147   LDLCALC 68 04/20/2020 1133    Other: (TSH, CBC, Vit D, etc.)  Clinical ASCVD: No  The 10-year ASCVD risk score Mikey Bussing DC Jr., et al., 2013) is: 2.8%   Values used to calculate the score:     Age: 62 years     Sex: Female     Is Non-Hispanic African American: No     Diabetic: Yes     Tobacco smoker: No     Systolic Blood Pressure: 562 mmHg     Is BP treated: Yes     HDL Cholesterol: 44 mg/dL     Total Cholesterol: 130 mg/dL    Other: (CHADS2VASc if Afib, PHQ9 if depression, MMRC or CAT for COPD, ACT, DEXA)  BP Readings from Last 3 Encounters:  05/18/20 126/80  04/20/20 117/78  02/16/20 118/76    Assessment/Interventions: Review of patient past medical history, allergies, medications, health status, including review of consultants reports, laboratory and other test data, was performed as part of comprehensive evaluation and provision of  chronic care management services.    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 HCTZ 12.5mg  Metoprolol 50mg  BID Plan: At goal,  patient stable/ symptoms controlled   Lipids Lab Results  Component Value Date   CHOL 130 04/20/2020   CHOL 156 04/29/2019   CHOL 159 09/08/2014   Lab Results  Component Value Date   HDL 44 04/20/2020   HDL 52 04/29/2019   HDL 58 09/08/2014   Lab Results  Component Value Date   LDLCALC 68 04/20/2020   LDLCALC 82 04/29/2019   LDLCALC 83 09/08/2014   Lab Results  Component Value Date   TRIG 95 04/20/2020   TRIG 125 04/29/2019   TRIG 89 09/08/2014   Lab Results  Component Value Date   CHOLHDL 3.0 04/20/2020   CHOLHDL 3.0 04/29/2019   CHOLHDL 2.7 09/08/2014   No results found for: LDLDIRECT  Atorvastatin 20mg  Plan: At goal,  patient stable/ symptoms controlled   COPD Symbicort Albuterol (Uses at night to make sure not coughing, no trouble getting to sleep) Monteleukast Plan: At goal,  patient stable/ symptoms controlled   Thyroid Lab Results  Component Value Date   TSH 0.867 05/06/2017    Thryoid removed Synthroid 269mcg Plan: At goal,  patient stable/ symptoms controlled   Migraines -Went from daily to 1/week Topirimate 200mg  Plan: Could possibly try  injection to prevent migraines more, will ask PCP  DM Lab Results  Component Value Date   HGBA1C 6.3 (H) 04/20/2020   HGBA1C 6.0 (A) 01/19/2020   HGBA1C 6.0 (A) 02/24/2019   Lab Results  Component Value Date   MICROALBUR 2.1 08/25/2015   LDLCALC 68 04/20/2020   CREATININE 0.85 12/30/2019    Lab Results  Component Value Date   NA 140 12/30/2019   K 3.4 (L) 12/30/2019   CREATININE 0.85 12/30/2019   GFRNONAA >60 12/30/2019   GFRAA >60 12/30/2019   GLUCOSE 128 (H) [id number]    Trulicity Plan: At goal,  patient stable/ symptoms controlled    Mood Depression screen [facility] 2/9 04/20/2020 01/19/2020 01/19/2020  Decreased Interest 1 1 0  Down,  Depressed, Hopeless 1 0 0  PHQ - 2 Score 2 1 0  Altered sleeping 0 0 0  Tired, decreased energy 0 3 0  Change in appetite 0 2 0  Feeling bad or failure about yourself  0 0 0  Trouble concentrating 3 1 0  Moving slowly or fidgety/restless 3 1 0  Suicidal thoughts 0 0 0  PHQ-9 Score 8 8 0  Difficult doing work/chores - Not difficult at all -  Some recent data might be hidden    -Patient content on therapy. Before meds, "Hard time dealing with loud noises and hates crowds and 'worries'" but now it's a lot less Bupropion 300mg  Buspirone 5mg  Citalopram 40mg  Doxepin (Also for sleep) Prazosin 5mg  (Nightmares) Plan: At goal,  patient stable/ symptoms controlled   Pain Before meds: 10/10   After: Dropped to reasonable (4/10) Gabapentin 300mg  Cyclobenzaprine Plan: At goal,  patient stable/ symptoms controlled     SDOH (Social Determinants of Health) assessments and interventions performed:    Care Plan  Allergies  Allergen Reactions  . Ceftriaxone Anaphylaxis    ROCEPHIN  . Penicillins Shortness Of Breath    Has patient had a PCN reaction causing immediate rash, facial/tongue/throat swelling, SOB or lightheadedness with hypotension: Yes Has patient had a PCN reaction causing severe rash involving mucus membranes or skin necrosis: No Has patient had a PCN reaction that required hospitalization No Has patient had a PCN reaction occurring within the last 10 years: No If all of the above answers are "NO", then [name] proceed with Cephalosporin use.   . Shellfish Allergy Anaphylaxis  . Flonase [Fluticasone Propionate]     Makes migraines worse  . Gold-Containing Drug Products     HANDS ITCH  . Nickel     HANDS SWELL  . Citrus Rash    Medications Reviewed Today    Reviewed by Gayla Medicus, RN (Registered Nurse) on 07/14/20 at 19  Med List Status: <None>  Medication Order Taking? Sig Documenting Provider Last Dose Status Informant  albuterol (PROVENTIL) (2.5 MG/3ML) 0.083%  nebulizer solution [id number] Yes Take 3 mLs (2.5 mg total) by nebulization every 6 (six) hours as needed for wheezing or shortness of breath. Goldine, [place], [name] Taking Active   atorvastatin (LIPITOR) 20 MG tablet [id number] Yes TAKE ONE TABLET BY MOUTH ONCE DAILY (BEDTIME) Melinda Pier, MD Taking Active   budesonide-formoterol [facility]) 160-4.5 MCG/ACT inhaler [id number] Yes Inhale 2 puffs into the lungs 2 (two) times daily. Maeola, [place] M, PA-C Taking Active   buPROPion (WELLBUTRIN XL) 300 MG 24 hr tablet [id number] Yes Take 300 mg by mouth daily.  [provider] Taking Active Self  busPIRone (BUSPAR) 5 MG tablet [id number] Yes Take 5 mg by mouth  3 (three) times daily. [provider] Taking Active Self           Med Note [name], [facility] I   Wed Feb 18, 2018 10:03 PM)    citalopram (CELEXA) 40 MG tablet [id number] Yes Take 40 mg by mouth daily.  [provider] Taking Active Self  cyclobenzaprine (FLEXERIL) 10 MG tablet [id number] Yes TAKE 1 TABLET (10 MG TOTAL) BY MOUTH 3 (THREE) TIMES DAILY AS NEEDED FOR MUSCLE SPASMS. Melinda Pier, MD Taking Active   doxepin [facility]) 10 MG capsule [id number] Yes Take 10 mg by mouth. [provider] Taking Active   ELIQUIS 5 MG TABS tablet [id number] Yes TAKE ONE TABLET BY MOUTH TWICE DAILY MORNING AND EVENING Melinda Pier, MD Taking Active   fluticasone (CUTIVATE) 0.05 % cream [id number] No Apply topically 2 (two) times daily.  Patient not taking: Reported on 07/14/2020   Boykin Nearing, MD Not Taking Active Self  gabapentin (NEURONTIN) 300 MG capsule [id number] Yes TAKE 1 CAPSULE (300 MG TOTAL) BY MOUTH 3 (THREE) TIMES DAILY. Melinda Pier, MD Taking Active   hydrochlorothiazide (MICROZIDE) 12.5 MG capsule [id number] Yes TAKE ONE CAPSULE BY MOUTH ONCE DAILY(AM) Melinda Pier, MD Taking Active   loratadine (CLARITIN) 10 MG tablet [id number] No Take 1 tablet (10 mg total) by mouth daily.   Patient not taking: Reported on 07/14/2020   Melinda Pier, MD Not Taking Active Self  metoprolol tartrate (LOPRESSOR) 50 MG tablet [id number] Yes TAKE ONE TABLET BY MOUTH TWICE DAILY Melinda Pier, MD Taking Active   montelukast (SINGULAIR) 10 MG tablet [id number] Yes TAKE 1 TABLET BY MOUTH EVERY EVENING AT BEDTIME Melinda Pier, MD Taking Active   PAZEO 0.7 % Bailey Mech [id number] Yes  [provider] Taking Active   prazosin (MINIPRESS) 1 MG capsule [id number] Yes Take 5 mg by mouth at bedtime.  [provider] Taking Active Self  SYNTHROID 200 MCG tablet [id number] Yes 212 mcg.  [provider] Taking Active   topiramate (TOPAMAX) 200 MG tablet [id number] Yes TAKE 1 TABLET (200 MG TOTAL) BY MOUTH AT BEDTIME. [name], DO Taking Active   TRULICITY [id number] SOPN [id number] Yes Inject into the skin. [provider] Taking Active           Patient Active Problem List   Diagnosis Date Noted  . At high risk for falls 04/20/2020  . Hyperlipidemia 02/16/2020  . Adrenal mass, right ([place]) 04/29/2019  . Chronic cough 03/12/2018  . Left shoulder pain 10/14/2016  . Hypertension 10/14/2016  . S/P thyroidectomy 06/27/2016  . Chronic bilateral low back pain without sciatica 04/22/2016  . Liver lesion 12/24/2015  . Papillary thyroid carcinoma ([place]) 12/21/2015  . Controlled type 2 diabetes mellitus with complication, with long-term current use of insulin ([place]) 10/10/2015  . Vision loss 08/25/2015  . Decreased vision in both eyes 08/25/2015  . Prolonged Q-T interval on ECG 08/19/2015  . History of pulmonary embolism 08/10/2015  . Mild persistent asthma 04/13/2015  . Fibroid, uterine   . Morbid obesity ([place]) 11/11/2014  . Atypical chest pain 11/08/2014  . Sinus tachycardia 11/08/2014  . Migraine 10/14/2014  . Menorrhagia 09/08/2014    Conditions to be addressed/monitored: HTN, Hypertriglyceridemia, COPD, DM, Anxiety and Depression  Care Plan  : Medication Management  Updates made by Lane Hacker, [place] since 07/21/2020 12:00 AM    Problem: Health Promotion or Disease Self-Management (General Plan of Care)     Goal: Medication Management  Note:   Current Barriers:  . Patient doing great at the moment and has no goals other than to maintain therapy .   Pharmacist Clinical Goal(s):  Marland Kitchen Over the next 180 days, patient will verbalize ability to afford treatment regimen . maintain control of disease states as evidenced by Current regimen  . contact provider office for questions/concerns as evidenced notation of same in electronic health record through collaboration with PharmD and provider.  .   Interventions: . Inter-disciplinary care team collaboration (see longitudinal plan of care) . Comprehensive medication review performed; medication list updated in electronic medical record  @RXCPDIABETES @ @RXCPHYPERTENSION @ @RXCPHYPERLIPIDEMIA @ @RXCPCOPD @ @RXCPMENTALHEALTH @  Patient Goals/Self-Care Activities . Over the next 180 days, patient will:  - take medications as prescribed collaborate with provider on medication access solutions  Follow Up Plan: The care management team will reach out to the patient again over the next 180 days.     Task: Mutually Develop and Royce Macadamia Achievement of Patient Goals   Note:   Care Management Activities:    - verbalization of feelings encouraged    Notes:       Medication Assistance: None required. Patient affirms current coverage meets needs.   Follow up: Agree   Plan: The care management team will reach out to the patient again over the next 180 days.   [place] Constable, Pharm.D., Managed Medicaid Pharmacist - [phone]

## 2020-07-21 NOTE — Patient Instructions (Signed)
Visit Information  Melinda [name] was given information about Medicaid Managed Care team care coordination services as a part of their [place] Medicaid benefit. Melinda [name] verbally consented to engagement with the [facility] Managed Care team.   For questions related to your [facility], please call: [phone] or visit the homepage here: [email or web address]  If you would like to schedule transportation through your [facility], please call the following number at least 2 days in advance of your appointment: [phone]  Melinda [name] - following are the goals we discussed in your visit today:  Goals Addressed            This Visit's Progress    Manage My Medicine       Timeframe:  Long-Range Goal Priority:  High Start Date:         07/21/20                    Expected End Date:                       Follow Up Date At all f/u visits   - call for medicine refill 2 or 3 days before it runs out - call if I am sick and can't take my medicine - keep a list of all the medicines I take; vitamins and herbals too    Why is this important?    These steps will help you keep on track with your medicines.   Notes:        Please see education materials related to Migraines provided as print materials.   Patient verbalizes understanding of instructions provided today.   The Managed Medicaid care management team will reach out to the patient again over the next 180 days.   Melinda [name], [facility]  Following is a copy of your plan of care:  Patient Care Plan: General Plan of Care (Adult)    Problem Identified: Health Promotion or Disease Self-Management (General Plan of Care)   Priority: Medium  Onset Date: 07/14/2020    Patient Care Plan: General Plan of Care (Adult)    Problem Identified: Health Promotion or Disease Self-Management (General Plan of Care)     Goal:  Self-Management Plan Developed   Start Date: 07/14/2020  Expected End Date: 10/12/2020  This Visit's Progress: On track  Priority: Medium  Note:    Current Barriers:   Chronic Disease Management support and education needs   Nurse Case Manager Clinical Goal(s):   Over the next 90 days, patient will attend all scheduled medical appointments:   Over the next 30 days, patient will work with CM team pharmacist to review medications.  Interventions:   Inter-disciplinary care team collaboration (see longitudinal plan of care)  Evaluation of current treatment plan  and patient's adherence to plan as established by provider.  Reviewed medications with patient.  Collaborated with pharmacy regarding medications.  Discussed plans with patient for ongoing care management follow up and provided patient with direct contact information for care management team  Reviewed scheduled/upcoming provider appointments.  Pharmacy referral for medication review.  Patient Goals/Self-Care Activities Over the next 90 days, patient will:  -Self administers medications as prescribed Attends all scheduled provider appointments Calls pharmacy for medication refills Calls provider office for new concerns or questions  Follow Up Plan: The Managed Medicaid care management team will reach out to the patient again over the next 30 days.  The  patient has been provided with contact information for the Managed Medicaid care management team and has been advised to call with any health related questions or concerns.      Evidence-based guidance:   Review biopsychosocial determinants of health screens.   Determine level of modifiable health risk.   Assess level of patient activation, level of readiness, importance and confidence to make changes.   Evoke change talk using open-ended questions, pros and cons, as well as looking forward.   Identify areas where behavior change [name] lead to improved health.    Partner with patient to develop a robust self-management plan that includes lifestyle factors, such as weight loss, exercise and healthy nutrition, as well as goals specific to disease risks.   Support patient and family/caregiver active participation in decision-making and self-management plan.   Implement additional goals and interventions based on identified risk factors to reduce health risk.   Facilitate advance care planning.   Review need for preventive screening based on age, sex, family history and health history.   Notes:    Task: Mutually Develop and Foster Achievement of Patient Goals   Note:   Care Management Activities:    - verbalization of feelings encouraged    Notes:    Patient Care Plan: Medication Management    Problem Identified: Health Promotion or Disease Self-Management (General Plan of Care)     Goal: Medication Management   Note:   Current Barriers:   Patient doing great at the moment and has no goals other than to maintain therapy    Pharmacist Clinical Goal(s):   Over the next 180 days, patient will verbalize ability to afford treatment regimen  maintain control of disease states as evidenced by Current regimen   contact provider office for questions/concerns as evidenced notation of same in electronic health record through collaboration with PharmD and provider.     Interventions:  Inter-disciplinary care team collaboration (see longitudinal plan of care)  Comprehensive medication review performed; medication list updated in electronic medical record  @RXCPDIABETES @ @RXCPHYPERTENSION @ @RXCPHYPERLIPIDEMIA @ @RXCPCOPD @ @RXCPMENTALHEALTH @  Patient Goals/Self-Care Activities  Over the next 180 days, patient will:  - take medications as prescribed collaborate with provider on medication access solutions  Follow Up Plan: The care management team will reach out to the patient again over the next 180 days.     Task: Mutually Develop  and Royce Macadamia Achievement of Patient Goals   Note:   Care Management Activities:    - verbalization of feelings encouraged    Notes:

## 2020-08-08 ENCOUNTER — Other Ambulatory Visit: Payer: Self-pay | Admitting: Internal Medicine

## 2020-08-08 DIAGNOSIS — S46011D Strain of muscle(s) and tendon(s) of the rotator cuff of right shoulder, subsequent encounter: Secondary | ICD-10-CM

## 2020-08-08 NOTE — Telephone Encounter (Signed)
Requested medication (s) are due for refill today: yes  Requested medication (s) are on the active medication list: yes  Last refill: 08/15/19  #30  1 refill  Future visit scheduled  yes 08/21/20  Notes to clinic: not delegated  Requested Prescriptions  Pending Prescriptions Disp Refills   cyclobenzaprine (FLEXERIL) 10 MG tablet [Pharmacy Med Name: CYCLOBENZAPRINE HCL 10 MG ORAL TABLET] 30 tablet 1    Sig: TAKE 1 TABLET (10 MG TOTAL) BY MOUTH 3 (THREE) TIMES DAILY AS NEEDED FOR MUSCLE SPASMS.      Not Delegated - Analgesics:  Muscle Relaxants Failed - 08/08/2020 11:44 AM      Failed - This refill cannot be delegated      Passed - Valid encounter within last 6 months    Recent Outpatient Visits           3 months ago Class 3 severe obesity due to excess calories with serious comorbidity and body mass index (BMI) of 45.0 to 49.9 in adult [facility])   [place], Neoma Laming B, MD   6 months ago Controlled type 2 diabetes mellitus with complication, with long-term current use of insulin [facility])   [place], [place], [name]   9 months ago Controlled type 2 diabetes mellitus with complication, without long-term current use of insulin [facility])   [place], MD   1 year ago Pap smear for cervical cancer screening   [place], MD   1 year ago Chronic bilateral low back pain without sciatica   [place], MD       Future Appointments             In 1 week [name], MD [place]

## 2020-08-11 DIAGNOSIS — F411 Generalized anxiety disorder: Secondary | ICD-10-CM | POA: Diagnosis not present

## 2020-08-11 DIAGNOSIS — F331 Major depressive disorder, recurrent, moderate: Secondary | ICD-10-CM | POA: Diagnosis not present

## 2020-08-11 DIAGNOSIS — F4310 Post-traumatic stress disorder, unspecified: Secondary | ICD-10-CM | POA: Diagnosis not present

## 2020-08-14 ENCOUNTER — Other Ambulatory Visit: Payer: Self-pay

## 2020-08-14 ENCOUNTER — Other Ambulatory Visit: Payer: Self-pay | Admitting: Obstetrics and Gynecology

## 2020-08-14 NOTE — Patient Outreach (Signed)
Medicaid Managed Care   Nurse Care Manager Note  08/14/2020 Name:  Melinda [name] MRN:  [id number] DOB:  August 05, 1965  Melinda [name] is an 55 y.o. year old female who is a primary patient of [name], MD.  The [facility] Managed Care Coordination team was consulted for assistance with:    chronic healthcare management needs.  Melinda [name] was given information about Medicaid Managed Care Coordination team services today. Melinda [name] agreed to services and verbal consent obtained.  Engaged with patient by telephone for follow up visit in response to provider referral for case management and/or care coordination services.   Assessments/Interventions:  Review of past medical history, allergies, medications, health status, including review of consultants reports, laboratory and other test data, was performed as part of comprehensive evaluation and provision of chronic care management services.  SDOH (Social Determinants of Health) assessments and interventions performed:   Care Plan  Allergies  Allergen Reactions  . Ceftriaxone Anaphylaxis    ROCEPHIN  . Penicillins Shortness Of Breath    Has patient had a PCN reaction causing immediate rash, facial/tongue/throat swelling, SOB or lightheadedness with hypotension: Yes Has patient had a PCN reaction causing severe rash involving mucus membranes or skin necrosis: No Has patient had a PCN reaction that required hospitalization No Has patient had a PCN reaction occurring within the last 10 years: No If all of the above answers are "NO", then [name] proceed with Cephalosporin use.   . Shellfish Allergy Anaphylaxis  . Flonase [Fluticasone Propionate]     Makes migraines worse  . Gold-Containing Drug Products     HANDS ITCH  . Nickel     HANDS SWELL  . [place]rus Rash    Medications Reviewed Today    Reviewed by Gayla Medicus, RN (Registered Nurse) on 08/14/20 at 1313  Med List Status: <None>  Medication Order Taking? Sig Documenting Provider  Last Dose Status Informant  albuterol (PROVENTIL) (2.5 MG/3ML) 0.083% nebulizer solution [id number] No Take 3 mLs (2.5 mg total) by nebulization every 6 (six) hours as needed for wheezing or shortness of breath. Adalaide, [place], [name] Taking Active   atorvastatin (LIPITOR) 20 MG tablet [id number] No TAKE ONE TABLET BY MOUTH ONCE DAILY (BEDTIME) [name], MD Taking Active   budesonide-formoterol [facility]) 160-4.5 MCG/ACT inhaler [id number] No Inhale 2 puffs into the lungs 2 (two) times daily. Brittie, [place] M, PA-C Taking Active   buPROPion (WELLBUTRIN XL) 300 MG 24 hr tablet [id number] No Take 300 mg by mouth daily.  [provider] Taking Active Self  busPIRone (BUSPAR) 5 MG tablet [id number] No Take 5 mg by mouth 3 (three) times daily. [provider] Taking Active Self           Med Note Melinda [name], [facility]   Wed Feb 18, 2018 10:03 PM)    citalopram (CELEXA) 40 MG tablet [id number] No Take 40 mg by mouth daily.  [provider] Taking Active Self  cyclobenzaprine (FLEXERIL) 10 MG tablet [id number]  TAKE 1 TABLET (10 MG TOTAL) BY MOUTH 3 (THREE) TIMES DAILY AS NEEDED FOR MUSCLE SPASMS. [name], MD  Active   doxepin (SINEQUAN) 10 MG capsule [id number] No Take 10 mg by mouth. [provider] Taking Active   ELIQUIS 5 MG TABS tablet [id number] No TAKE ONE TABLET BY MOUTH TWICE DAILY MORNING AND EVENING [name], MD Taking Active   fluticasone (CUTIVATE) 0.05 % cream [id number] No Apply topically 2 (two) times daily.  Patient not taking:  No sig reported   Boykin Nearing, MD Not Taking Active   gabapentin (NEURONTIN) 300 MG capsule [id number]  TAKE 1 CAPSULE (300 MG TOTAL) BY MOUTH 3 (THREE) TIMES DAILY. [name], MD  Active   hydrochlorothiazide (MICROZIDE) 12.5 MG capsule [id number] No TAKE ONE CAPSULE BY MOUTH ONCE DAILY(AM) [name], MD Taking Active   loratadine (CLARITIN) 10 MG tablet [id number] No Take 1 tablet  (10 mg total) by mouth daily.  Patient not taking: No sig reported   [name], MD Not Taking Active   metoprolol tartrate (LOPRESSOR) 50 MG tablet [id number] No TAKE ONE TABLET BY MOUTH TWICE DAILY [name], MD Taking Active   montelukast (SINGULAIR) 10 MG tablet [id number] No TAKE 1 TABLET BY MOUTH EVERY EVENING AT BEDTIME [name], MD Taking Active   PAZEO 0.7 % SOLN [id number] No   Patient not taking: Reported on 07/21/2020   [provider] Not Taking Active   prazosin (MINIPRESS) 1 MG capsule [id number] No Take 6 mg by mouth at bedtime. [provider] Taking Active Self  SYNTHROID 200 MCG tablet [id number] No 212 mcg.  [provider] Taking Active   topiramate (TOPAMAX) 200 MG tablet [id number] No TAKE 1 TABLET (200 MG TOTAL) BY MOUTH AT BEDTIME. [name], DO Taking Active   TRULICITY 8.92 JJ/9.4RD SOPN [id number] No Inject into the skin. [provider] Taking Active           Patient Active Problem List   Diagnosis Date Noted  . At high risk for falls 04/20/2020  . Hyperlipidemia 02/16/2020  . Adrenal mass, right ([place]) 04/29/2019  . Chronic cough 03/12/2018  . Left shoulder pain 10/14/2016  . Hypertension 10/14/2016  . S/P thyroidectomy 06/27/2016  . Chronic bilateral low back pain without sciatica 04/22/2016  . Liver lesion 12/24/2015  . Papillary thyroid carcinoma ([place]) 12/21/2015  . Controlled type 2 diabetes mellitus with complication, with long-term current use of insulin ([place]) 10/10/2015  . Vision loss 08/25/2015  . Decreased vision in both eyes 08/25/2015  . Prolonged Q-T interval on ECG 08/19/2015  . History of pulmonary embolism 08/10/2015  . Mild persistent asthma 04/13/2015  . Fibroid, uterine   . Morbid obesity ([place]) 11/11/2014  . Atypical chest pain 11/08/2014  . Sinus tachycardia 11/08/2014  . Migraine 10/14/2014  . Menorrhagia 09/08/2014    Conditions to be addressed/monitored per PCP  order:  chronic healthcare management needs, DM2, asthma, glaucoma, low back pain, anxiety, HTN, H/A  Care Plan : General Plan of Care (Adult)  Updates made by Gayla Medicus, RN since 08/14/2020 12:00 AM    Problem: Health Promotion or Disease Self-Management (General Plan of Care)     Long-Range Goal: Self-Management Plan Developed   Start Date: 07/14/2020  Expected End Date: 10/12/2020  Recent Progress: On track  Priority: Medium  Note:    Current Barriers:  . Chronic Disease Management support and education needs  . Patient complaining of nose bleeds and productive cough with green sputum.  Nurse Case Manager Clinical Goal(s):  Marland Kitchen Over the next 90 days, patient will attend all scheduled medical appointments:  . Over the next 30 days, patient will work with CM team pharmacist to review medications. Marland Kitchen Update 08/14/20:  patient met with CM team pharmacist 07/21/20 and is following.  Interventions:  . Inter-disciplinary care team collaboration (see longitudinal plan of care) . Evaluation of current treatment plan  and patient's adherence to plan as established by  provider. . Reviewed medications with patient. Nash Dimmer with pharmacy regarding medications. . Discussed plans with patient for ongoing care management follow up and provided patient with direct contact information for care management team . Reviewed scheduled/upcoming provider appointments. . Pharmacy referral for medication review. Marland Kitchen Update 08/14/20:  Encouraged patient to call PCP for evaluation of nose bleeds and productive cough.  Patient Goals/Self-Care Activities Over the next 90 days, patient will:  -Self administers medications as prescribed Attends all scheduled provider appointments Calls pharmacy for medication refills Calls provider office for new concerns or questions  Follow Up Plan: The Managed Medicaid care management team will reach out to the patient again over the next 30 days.  The patient has  been provided with contact information for the Managed Medicaid care management team and has been advised to call with any health related questions or concerns.      Evidence-based guidance:   Review biopsychosocial determinants of health screens.   Determine level of modifiable health risk.   Assess level of patient activation, level of readiness, importance and confidence to make changes.   Evoke change talk using open-ended questions, pros and cons, as well as looking forward.   Identify areas where behavior change [name] lead to improved health.   Partner with patient to develop a robust self-management plan that includes lifestyle factors, such as weight loss, exercise and healthy nutrition, as well as goals specific to disease risks.   Support patient and family/caregiver active participation in decision-making and self-management plan.   Implement additional goals and interventions based on identified risk factors to reduce health risk.   Facilitate advance care planning.   Review need for preventive screening based on age, sex, family history and health history.       Follow Up:  Patient agrees to Care Plan and Follow-up.  Plan: The Managed Medicaid care management team will reach out to the patient again over the next 30 days. and The patient has been provided with contact information for the Managed Medicaid care management team and has been advised to call with any health related questions or concerns.  Date/time of next scheduled RN care management/care coordination outreach:  09/11/20 at 1030.

## 2020-08-14 NOTE — Patient Instructions (Signed)
Hi [name], thank you for speaking with me today.  Melinda [name] was given information about Medicaid Managed Care team care coordination services as a part of their Melinda [name]. Melinda [name] verbally consented to engagement with the [facility] Managed Care team.   For questions related to your [facility], please call: [phone] or visit the homepage here: [email or web address]  If you would like to schedule transportation through your [facility], please call the following number at least 2 days in advance of your appointment: [phone]  Melinda [name] - following are the goals we discussed in your visit today:  Goals Addressed            This Visit's Progress   . Protect My Health       Timeframe:  Long-Range Goal Priority:  Medium Start Date:    07/14/20                         Expected End Date:     10/12/20                  Follow Up Date 08/14/20   - schedule appointment for vaccines needed due to my age or health - schedule recommended health tests (blood work, mammogram, colonoscopy, pap test) - schedule and keep appointment for annual check-up  Update 08/14/20:  patient has scheduled appointment with PCP 08/21/20              Patient verbalizes understanding of instructions provided today.   The Managed Medicaid care management team will reach out to the patient again over the next 30 days.  The patient has been provided with contact information for the Managed Medicaid care management team and has been advised to call with any health related questions or concerns.  Melinda Raider RN, BSN [facility] Care Management Coordinator - Managed Medicaid High Risk [phone].  Following is a copy of your plan of care:  Patient Care Plan: General Plan of Care (Adult)    Problem Identified: Health Promotion or Disease  Self-Management (General Plan of Care)   Priority: Medium  Onset Date: 07/14/2020        Long-Range Goal: Self-Management Plan Developed   Start Date: 07/14/2020  Expected End Date: 10/12/2020  Recent Progress: On track  Priority: Medium  Note:    Current Barriers:  . Chronic Disease Management support and education needs  . Patient complaining of nose bleeds and productive cough with green sputum.  Nurse Case Manager Clinical Goal(s):  Marland Kitchen Over the next 90 days, patient will attend all scheduled medical appointments:  . Over the next 30 days, patient will work with CM team pharmacist to review medications. Marland Kitchen Update 08/14/20:  patient met with CM team pharmacist 07/21/20 and is following.  Interventions:  . Inter-disciplinary care team collaboration (see longitudinal plan of care) . Evaluation of current treatment plan  and patient's adherence to plan as established by provider. . Reviewed medications with patient. Nash Dimmer with pharmacy regarding medications. . Discussed plans with patient for ongoing care management follow up and provided patient with direct contact information for care management team . Reviewed scheduled/upcoming provider appointments. . Pharmacy referral for medication review. Marland Kitchen Update 08/14/20:  Encouraged patient to call PCP for evaluation of nose bleeds and productive cough.  Patient Goals/Self-Care Activities Over the next 90 days, patient will:  -Self administers medications as prescribed  Attends all scheduled provider appointments Calls pharmacy for medication refills Calls provider office for new concerns or questions  Follow Up Plan: The Managed Medicaid care management team will reach out to the patient again over the next 30 days.  The patient has been provided with contact information for the Managed Medicaid care management team and has been advised to call with any health related questions or concerns.      Evidence-based guidance:    Review biopsychosocial determinants of health screens.   Determine level of modifiable health risk.   Assess level of patient activation, level of readiness, importance and confidence to make changes.   Evoke change talk using open-ended questions, pros and cons, as well as looking forward.   Identify areas where behavior change [name] lead to improved health.   Partner with patient to develop a robust self-management plan that includes lifestyle factors, such as weight loss, exercise and healthy nutrition, as well as goals specific to disease risks.   Support patient and family/caregiver active participation in decision-making and self-management plan.   Implement additional goals and interventions based on identified risk factors to reduce health risk.   Facilitate advance care planning.   Review need for preventive screening based on age, sex, family history and health history.

## 2020-08-21 ENCOUNTER — Encounter: Payer: Self-pay | Admitting: Internal Medicine

## 2020-08-21 ENCOUNTER — Ambulatory Visit: Payer: Medicaid Other | Attending: Internal Medicine | Admitting: Internal Medicine

## 2020-08-21 ENCOUNTER — Other Ambulatory Visit: Payer: Self-pay

## 2020-08-21 VITALS — BP 120/80 | HR 89 | Resp 16 | Wt 333.2 lb

## 2020-08-21 DIAGNOSIS — B9789 Other viral agents as the cause of diseases classified elsewhere: Secondary | ICD-10-CM | POA: Diagnosis not present

## 2020-08-21 DIAGNOSIS — Z6841 Body Mass Index (BMI) 40.0 and over, adult: Secondary | ICD-10-CM

## 2020-08-21 DIAGNOSIS — R040 Epistaxis: Secondary | ICD-10-CM

## 2020-08-21 DIAGNOSIS — J988 Other specified respiratory disorders: Secondary | ICD-10-CM | POA: Diagnosis not present

## 2020-08-21 DIAGNOSIS — E1142 Type 2 diabetes mellitus with diabetic polyneuropathy: Secondary | ICD-10-CM

## 2020-08-21 DIAGNOSIS — I10 Essential (primary) hypertension: Secondary | ICD-10-CM

## 2020-08-21 LAB — POCT GLYCOSYLATED HEMOGLOBIN (HGB A1C): HbA1c, POC (controlled diabetic range): 5.4 % (ref 0.0–7.0)

## 2020-08-21 LAB — GLUCOSE, POCT (MANUAL RESULT ENTRY): POC Glucose: 193 mg/dl — AB (ref 70–99)

## 2020-08-21 MED ORDER — DOXYCYCLINE HYCLATE 100 MG PO TABS
100.0000 mg | ORAL_TABLET | Freq: Two times a day (BID) | ORAL | 0 refills | Status: DC
Start: 2020-08-21 — End: 2020-12-19

## 2020-08-21 MED ORDER — SALINE SPRAY 0.65 % NA SOLN: 1.0000 | NASAL | 0 refills | Status: DC | PRN

## 2020-08-21 NOTE — Patient Instructions (Signed)
Nosebleed, Adult A nosebleed is when blood comes out of the nose. Nosebleeds are common and can be caused by many things. They are usually not a sign of a serious medical problem. Follow these instructions at home: When you have a nosebleed:  Sit down.  Tilt your head a little forward.  Follow these steps: 1. Pinch your nose with a clean towel or tissue. 2. Keep pinching your nose for 5 minutes. Do not let go. 3. After 5 minutes, let go of your nose. 4. If there is still bleeding, do these steps again. Keep doing these steps until the bleeding stops.  Do not put tissues or other things in your nose to stop the bleeding.  Avoid lying down or putting your head back.  Use a nose spray decongestant as told by your doctor.   After a nosebleed:  Try not to blow your nose or sniffle for several hours.  Try not to strain, lift, or bend at the waist for several days.  Aspirin and blood-thinning medicines make bleeding more likely. If you take these medicines: ? Ask your doctor if you should stop taking them or if you should change how much you take. ? Do not stop taking the medicine unless your doctor tells you to.  If your nosebleed was caused by dryness, use over-the-counter saline nasal spray or gel and humidifier as told by your doctor. This will keep the inside of your nose moist and allow it to heal. If you need to use one of these products: ? Choose one that is water-soluble. ? Use only as much as you need and use it only as often as needed. ? Do not lie down right away after you use it.  If you get nosebleeds often, talk with your doctor about treatments. These may include: ? Nasal cautery. A chemical swab or electrical device is used to lightly burn tiny blood vessels inside the nose. This helps stop or prevent nosebleeds. ? Nasal packing. A gauze or other material is placed in the nose to keep constant pressure on the bleeding area. Contact a doctor if:  You have a  fever.  You get nosebleeds often.  You are getting nosebleeds more often than usual.  You bruise very easily.  You have something stuck in your nose.  You have bleeding in your mouth.  You vomit or cough up brown material.  You get a nosebleed after you start a new medicine. Get help right away if:  You have a nosebleed after you fall or hurt your head.  Your nosebleed does not go away after 20 minutes.  You feel dizzy or weak.  You have unusual bleeding from other parts of your body.  You have unusual bruising on other parts of your body.  You get sweaty.  You vomit blood. Summary  Nosebleeds are common. They are usually not a sign of a serious medical problem.  When you have a nosebleed, sit down and tilt your head a little forward. Pinch your nose with a clean tissue for 5 minutes.  Use saline spray or saline gel and a humidifier as told by your doctor.  Get help right away if your nosebleed does not go away after 20 minutes. This information is not intended to replace advice given to you by your health care provider. Make sure you discuss any questions you have with your health care provider. Document Revised: 04/15/2019 Document Reviewed: 04/15/2019 Elsevier Patient Education  2021 [employer].

## 2020-08-21 NOTE — Progress Notes (Signed)
Patient ID: Melinda [name], female    DOB: [birth year]  MRN: [id number]  CC: Diabetes and Hypertension   Subjective: Melinda [name] is a 55 y.o. female who presents for chronic disease management Her concerns today include:  Pt with hxofDM(followed by Dr. Jana Hakim, moderate persistent asthma,migraines followed by neurology, uterine fibroids followed by GYN, papillary thyroid CA s/pthyroidectomy, history of DVT/PE onEliquislifetime,obesity, PTSD/anxiety ([facility]).  Pt c/o cough productive of green phlegm and nasal congestion x 1 mth No increase SOB.  No loss of taste/smell, fever, recent sick contacts.  She completed COVID-19 vaccine series.   She has been having epistaxis from RT nostril daily for last few mths. Started using humidifier.  Last nose bleed today.episodes last a few minutes.  Stops by putting tissue in nose and applying pressure.  Not using any nasal sprays.  Does not pick nose but rubs the nose when she has itching..  She is on ELiquis.    HTN: compliant with Metoprolol and hydrochlorothiazide.  She limits salt in the foods.  No chest pains or shortness of breath.  No swelling in the legs.  DM:  Started on Trulicity by her endocrinologist several mths ago.  Does not check BS.  Doing well with eating habits.  No wgh loss so far on the Trulicity Has f/u with Dr. Buddy Duty next mth Results for orders placed or performed in visit on 08/21/20  POCT glucose (manual entry)  Result Value Ref Range   POC Glucose 193 (A) 70 - 99 mg/dl  POCT glycosylated hemoglobin (Hb A1C)  Result Value Ref Range   Hemoglobin A1C     HbA1c POC (<> result, manual entry)     HbA1c, POC (prediabetic range)     HbA1c, POC (controlled diabetic range) 5.4 0.0 - 7.0 %    Psychiatrist increased Prazosin to 6 mg because she has nightmares where she knock things over in her sleep Patient Active Problem List   Diagnosis Date Noted  . At high risk for falls 04/20/2020  . Hyperlipidemia  02/16/2020  . Adrenal mass, right ([place]) 04/29/2019  . Chronic cough 03/12/2018  . Left shoulder pain 10/14/2016  . Hypertension 10/14/2016  . S/P thyroidectomy 06/27/2016  . Chronic bilateral low back pain without sciatica 04/22/2016  . Liver lesion 12/24/2015  . Papillary thyroid carcinoma ([place]) 12/21/2015  . Controlled type 2 diabetes mellitus with complication, with long-term current use of insulin ([place]) 10/10/2015  . Vision loss 08/25/2015  . Decreased vision in both eyes 08/25/2015  . Prolonged Q-T interval on ECG 08/19/2015  . History of pulmonary embolism 08/10/2015  . Mild persistent asthma 04/13/2015  . Fibroid, uterine   . Morbid obesity ([place]) 11/11/2014  . Atypical chest pain 11/08/2014  . Sinus tachycardia 11/08/2014  . Migraine 10/14/2014  . Menorrhagia 09/08/2014     Current Outpatient Medications on File Prior to Visit  Medication Sig Dispense Refill  . albuterol (PROVENTIL) (2.5 MG/3ML) 0.083% nebulizer solution Take 3 mLs (2.5 mg total) by nebulization every 6 (six) hours as needed for wheezing or shortness of breath. 3 mL 3  . atorvastatin (LIPITOR) 20 MG tablet TAKE ONE TABLET BY MOUTH ONCE DAILY (BEDTIME) 30 tablet 2  . budesonide-formoterol (SYMBICORT) 160-4.5 MCG/ACT inhaler Inhale 2 puffs into the lungs 2 (two) times daily. 2 Inhaler 0  . buPROPion (WELLBUTRIN XL) 300 MG 24 hr tablet Take 300 mg by mouth daily.   2  . busPIRone (BUSPAR) 5 MG tablet Take 5 mg by mouth 3 (  three) times daily.    . citalopram (CELEXA) 40 MG tablet Take 40 mg by mouth daily.   2  . cyclobenzaprine (FLEXERIL) 10 MG tablet TAKE 1 TABLET (10 MG TOTAL) BY MOUTH 3 (THREE) TIMES DAILY AS NEEDED FOR MUSCLE SPASMS. 30 tablet 0  . doxepin (SINEQUAN) 10 MG capsule Take 10 mg by mouth.    Arne Cleveland 5 MG TABS tablet TAKE ONE TABLET BY MOUTH TWICE DAILY MORNING AND EVENING 60 tablet 2  . fluticasone (CUTIVATE) 0.05 % cream Apply topically 2 (two) times daily. (Patient not taking: No sig  reported) 30 g 2  . gabapentin (NEURONTIN) 300 MG capsule TAKE 1 CAPSULE (300 MG TOTAL) BY MOUTH 3 (THREE) TIMES DAILY. 90 capsule 0  . hydrochlorothiazide (MICROZIDE) 12.5 MG capsule TAKE ONE CAPSULE BY MOUTH ONCE DAILY(AM) 30 capsule 2  . loratadine (CLARITIN) 10 MG tablet Take 1 tablet (10 mg total) by mouth daily. (Patient not taking: No sig reported) 30 tablet 11  . metoprolol tartrate (LOPRESSOR) 50 MG tablet TAKE ONE TABLET BY MOUTH TWICE DAILY 60 tablet 2  . montelukast (SINGULAIR) 10 MG tablet TAKE 1 TABLET BY MOUTH EVERY EVENING AT BEDTIME 30 tablet 2  . PAZEO 0.7 % SOLN  (Patient not taking: Reported on 07/21/2020)    . prazosin (MINIPRESS) 1 MG capsule Take 6 mg by mouth at bedtime.    Marland Kitchen SYNTHROID 200 MCG tablet 212 mcg.     . topiramate (TOPAMAX) 200 MG tablet TAKE 1 TABLET (200 MG TOTAL) BY MOUTH AT BEDTIME. 90 tablet 3  . TRULICITY 8.18 HU/3.1SH SOPN Inject into the skin.     No current facility-administered medications on file prior to visit.    Allergies  Allergen Reactions  . Ceftriaxone Anaphylaxis    ROCEPHIN  . Penicillins Shortness Of Breath    Has patient had a PCN reaction causing immediate rash, facial/tongue/throat swelling, SOB or lightheadedness with hypotension: Yes Has patient had a PCN reaction causing severe rash involving mucus membranes or skin necrosis: No Has patient had a PCN reaction that required hospitalization No Has patient had a PCN reaction occurring within the last 10 years: No If all of the above answers are "NO", then may proceed with Cephalosporin use.   . Shellfish Allergy Anaphylaxis  . Flonase [Fluticasone Propionate]     Makes migraines worse  . Gold-Containing Drug Products     HANDS ITCH  . Nickel     HANDS SWELL  . [place] Rash    Social History   Socioeconomic History  . Marital status: Single    Spouse name: Not on file  . Number of children: 1  . Years of education: 2  . Highest education level: Some college, no  degree  Occupational History  . Occupation: disabled  Tobacco Use  . Smoking status: Former Smoker    Packs/day: 0.50    Years: 40.00    Pack years: 20.00    Types: Cigarettes    Quit date: 11/03/2014    Years since quitting: 5.8  . Smokeless tobacco: Never Used  Vaping Use  . Vaping Use: Never used  Substance and Sexual Activity  . Alcohol use: No    Alcohol/week: 0.0 standard drinks  . Drug use: No  . Sexual activity: Never    Birth control/protection: None  Other Topics Concern  . Not on file  Social History Narrative   Patient is right-handed. She lives in a one level handicap accessible apartment. She avoids caffeine. She  uses stationary stationary pedals to use for exercise.   Social Determinants of Health   Financial Resource Strain: Not on file  Food Insecurity: Not on file  Transportation Needs: Not on file  Physical Activity: Not on file  Stress: Not on file  Social Connections: Not on file  Intimate Partner Violence: Not on file    Family History  Problem Relation Age of Onset  . Diabetes Mother   . Breast cancer Mother   . CAD Mother   . Hypertension Mother   . Alcohol abuse Father   . Hypertension Father   . Breast cancer Maternal Aunt   . Breast cancer Maternal Aunt     Past Surgical History:  Procedure Laterality Date  . ANGIOGRAM TO LEG  08-13-15   RIGHT  . CHOLECYSTECTOMY    . IR GENERIC HISTORICAL  04/04/2016   IR RADIOLOGIST EVAL & MGMT 04/04/2016 [name], MD [facility] INTERV RAD  . THYROIDECTOMY N/A 11/17/2015   Procedure: TOTAL THYROIDECTOMY;  Surgeon: Armandina Gemma, MD;  Location: [facility];  Service: General;  Laterality: N/A;  . UTERINE ARTERY EMBOLIZATION Bilateral 08/13/2015    ROS: Review of Systems Negative except as stated above  PHYSICAL EXAM: BP 120/80   Pulse 89   Resp 16   Wt (!) 333 lb 3.2 oz (151.1 kg)   SpO2 96%   BMI 47.81 kg/m   Wt Readings from Last 3 Encounters:  08/21/20 (!) 333 lb 3.2 oz (151.1 kg)  05/18/20 (!)  334 lb (151.5 kg)  04/20/20 (!) 329 lb 12.8 oz (149.6 kg)    Physical Exam  General appearance - alert, well appearing, obese middle-aged African-American female and in no distress Mental status - normal mood, behavior, speech, dress, motor activity, and thought processes Nose -she has a spot of dried blood noted on the nasal septum right nostrils.  Nasal mucosa dry Mouth -she is edentulous Chest - clear to auscultation, no wheezes, rales or rhonchi, symmetric air entry Heart - normal rate, regular rhythm, normal S1, S2, no murmurs, rubs, clicks or gallops Extremities - peripheral pulses normal, no pedal edema, no clubbing or cyanosis MSK: She ambulates with a Rollator walker.  She transfers independently onto the exam table. Diabetic Foot Exam - Simple   Simple Foot Form Visual Inspection No deformities, no ulcerations, no other skin breakdown bilaterally: Yes Sensation Testing Intact to touch and monofilament testing bilaterally: Yes Pulse Check Posterior Tibialis and Dorsalis pulse intact bilaterally: Yes Comments     CMP Latest Ref Rng & Units 04/20/2020 12/30/2019 04/29/2019  Glucose 70 - 99 mg/dL - 128(H) 113(H)  BUN 6 - 20 mg/dL - 10 15  Creatinine 0.44 - 1.00 mg/dL - 0.85 1.07(H)  Sodium 135 - 145 mmol/L - 140 142  Potassium 3.5 - 5.1 mmol/L - 3.4(L) 3.7  Chloride 98 - 111 mmol/L - 112(H) 107(H)  CO2 22 - 32 mmol/L - 20(L) 22  Calcium 8.9 - 10.3 mg/dL - 8.3(L) 9.1  Total Protein 6.0 - 8.5 g/dL 6.8 - 7.1  Total Bilirubin 0.0 - 1.2 mg/dL 0.4 - 0.3  Alkaline Phos 44 - 121 IU/L 136(H) - 135(H)  AST 0 - 40 IU/L 14 - 11  ALT 0 - 32 IU/L 26 - 12   Lipid Panel     Component Value Date/Time   CHOL 130 04/20/2020 1133   TRIG 95 04/20/2020 1133   HDL 44 04/20/2020 1133   CHOLHDL 3.0 04/20/2020 1133   CHOLHDL 2.7 09/08/2014 0147  VLDL 18 09/08/2014 0147   LDLCALC 68 04/20/2020 1133    CBC    Component Value Date/Time   WBC 10.1 12/30/2019 1530   RBC 4.86 12/30/2019  1530   HGB 14.2 12/30/2019 1530   HGB 14.7 04/29/2019 1106   HCT 44.0 12/30/2019 1530   HCT 45.4 04/29/2019 1106   PLT 282 12/30/2019 1530   PLT 289 04/29/2019 1106   MCV 90.5 12/30/2019 1530   MCV 90 04/29/2019 1106   MCH 29.2 12/30/2019 1530   MCHC 32.3 12/30/2019 1530   RDW 13.5 12/30/2019 1530   RDW 14.0 04/29/2019 1106   LYMPHSABS 1.9 02/18/2018 2337   MONOABS 0.6 02/18/2018 2337   EOSABS 0.1 02/18/2018 2337   BASOSABS 0.0 02/18/2018 2337    ASSESSMENT AND PLAN: 1. Type 2 diabetes mellitus with peripheral neuropathy (HCC) A1c at goal.  Encouraged her to continue healthy eating habits.  She will continue the Trulicity.  She has follow-up appointment with the endocrinologist next month. - POCT glucose (manual entry) - POCT glycosylated hemoglobin (Hb A1C)  2. Essential hypertension At goal.  Continue hydrochlorothiazide and metoprolol  3. Class 3 severe obesity due to excess calories with serious comorbidity and body mass index (BMI) of 45.0 to 49.9 in adult [facility]) See #1 above.  4. Epistaxis Advised not to pick the nose or rub the nose too hard.  We will give some [place] nasal spray to use as needed.  She is on Eliquis.  We will continue the Eliquis. - Ambulatory referral to ENT - CBC With Differential - sodium chloride (OCEAN) 0.65 % SOLN nasal spray; Place 1 spray into both nostrils as needed for congestion.  Dispense: 30 mL; Refill: 0  5. Viral respiratory illness She has component of bronchitis.  Have given some doxycycline but will also check her for Covid. - Novel Coronavirus, NAA ([facility])    Patient was given the opportunity to ask questions.  Patient verbalized understanding of the plan and was able to repeat key elements of the plan.   Orders Placed This Encounter  Procedures  . POCT glucose (manual entry)  . POCT glycosylated hemoglobin (Hb A1C)     Requested Prescriptions    No prescriptions requested or ordered in this encounter    No follow-ups  on file.  [name], MD, FACP

## 2020-08-23 LAB — SARS-COV-2, NAA 2 DAY TAT

## 2020-08-23 LAB — NOVEL CORONAVIRUS, NAA: SARS-CoV-2, NAA: NOT DETECTED

## 2020-08-25 ENCOUNTER — Other Ambulatory Visit: Payer: Self-pay

## 2020-08-25 ENCOUNTER — Ambulatory Visit: Payer: Medicaid Other | Attending: Internal Medicine

## 2020-08-25 DIAGNOSIS — R040 Epistaxis: Secondary | ICD-10-CM | POA: Diagnosis not present

## 2020-08-26 LAB — CBC WITH DIFFERENTIAL
Basophils Absolute: 0 10*3/uL (ref 0.0–0.2)
Basos: 1 %
EOS (ABSOLUTE): 0.1 10*3/uL (ref 0.0–0.4)
Eos: 1 %
Hematocrit: 46.6 % (ref 34.0–46.6)
Hemoglobin: 15.7 g/dL (ref 11.1–15.9)
Immature Grans (Abs): 0.1 10*3/uL (ref 0.0–0.1)
Immature Granulocytes: 1 %
Lymphocytes Absolute: 1.8 10*3/uL (ref 0.7–3.1)
Lymphs: 22 %
MCH: 30.1 pg (ref 26.6–33.0)
MCHC: 33.7 g/dL (ref 31.5–35.7)
MCV: 89 fL (ref 79–97)
Monocytes Absolute: 0.6 10*3/uL (ref 0.1–0.9)
Monocytes: 8 %
Neutrophils Absolute: 5.3 10*3/uL (ref 1.4–7.0)
Neutrophils: 67 %
RBC: 5.22 x10E6/uL (ref 3.77–5.28)
RDW: 13.1 % (ref 11.7–15.4)
WBC: 7.9 10*3/uL (ref 3.4–10.8)

## 2020-09-05 ENCOUNTER — Other Ambulatory Visit: Payer: Self-pay | Admitting: Internal Medicine

## 2020-09-05 DIAGNOSIS — Z794 Long term (current) use of insulin: Secondary | ICD-10-CM

## 2020-09-05 DIAGNOSIS — J4550 Severe persistent asthma, uncomplicated: Secondary | ICD-10-CM

## 2020-09-05 DIAGNOSIS — I10 Essential (primary) hypertension: Secondary | ICD-10-CM

## 2020-09-05 DIAGNOSIS — E118 Type 2 diabetes mellitus with unspecified complications: Secondary | ICD-10-CM

## 2020-09-05 DIAGNOSIS — Z86718 Personal history of other venous thrombosis and embolism: Secondary | ICD-10-CM

## 2020-09-11 ENCOUNTER — Other Ambulatory Visit: Payer: Self-pay

## 2020-09-11 ENCOUNTER — Other Ambulatory Visit: Payer: Self-pay | Admitting: Obstetrics and Gynecology

## 2020-09-11 NOTE — Patient Instructions (Signed)
Hi Melinda [name] you for speaking with me this morning.  Melinda [name] was given information about Medicaid Managed Care team care coordination services as a part of their [place] Medicaid benefit. Melinda [name] verbally consented to engagement with the [facility] Managed Care team.   For questions related to your [facility], please call: [phone] or visit the homepage here: [email or web address]  If you would like to schedule transportation through your [facility], please call the following number at least 2 days in advance of your appointment: [phone].   Melinda [name] - following are the goals we discussed in your visit today:  Goals Addressed            This Visit's Progress   . Protect My Health       Timeframe:  Long-Range Goal Priority:  Medium Start Date:    07/14/20                         Expected End Date:     10/12/20                  Follow Up Date 09/18/20   - schedule appointment for vaccines needed due to my age or health - schedule recommended health tests (blood work, mammogram, colonoscopy, pap test) - schedule and keep appointment for annual check-up  Update 08/14/20:  patient has scheduled appointment with PCP 08/21/20 Update 09/11/20:  patient attended appointment with PCP on 08/21/20.  Has appointment scheduled with ENT 09/22/20 due to nose bleeds.  Encouraged patient to schedule appointment with GYN due to bleeding.       Patient verbalizes understanding of instructions provided today.   The Managed Medicaid care management team will reach out to the patient again over the next 7 days.  The patient has been provided with contact information for the Managed Medicaid care management team and has been advised to call with any health related questions or concerns.   Melinda Raider RN, BSN [place]  [facility], hospital - Managed Medicaid High Risk [phone].  Following is a copy of your plan of care:      Patient Care Plan: General Plan of Care (Adult)    Problem Identified: Health Promotion or Disease Self-Management (General Plan of Care)     Long-Range Goal: Self-Management Plan Developed   Start Date: 07/14/2020  Expected End Date: 10/12/2020  Recent Progress: On track  Priority: Medium  Note:    Current Barriers:  . Chronic Disease Management support and education needs  . Patient complaining of nose bleeds and productive cough with green sputum. Patient has appointment with ENT 09/22/20 at 130. Melinda [name] Patient with increased uterine bleeding-changing pad every 1-2 hours-h/o uterine fibroids.  To schedule appointment with GYN at [facility]. . Patient states she fell out of bed and now having shoulder and neck pain-to see MD today, 09/11/20.  Nurse Case Manager Clinical Goal(s):  Melinda [name] Over the next 90 days, patient will attend all scheduled medical appointments:  . Update 09/11/20:  Patient is attending all appointments. . Over the next 30 days, patient will work with CM team pharmacist to review medications. Melinda [name] Update 08/14/20:  patient met with CM team pharmacist 07/21/20 and is following.  Interventions:  . Inter-disciplinary care team collaboration (see longitudinal plan of care) . Evaluation of current treatment plan  and patient's adherence to plan as established by  provider. . Reviewed medications with patient. Nash Dimmer with pharmacy regarding medications. . Discussed plans with patient for ongoing care management follow up and provided patient with direct contact information for care management team . Reviewed scheduled/upcoming provider appointments. . Pharmacy referral for medication review. Melinda [name] Update 08/14/20:  Encouraged patient to call PCP for evaluation of nose bleeds and productive cough. Melinda [name] Update 09/11/20:  Patient evaluated by PCP 08/21/20, has  appointment with ENT scheduled 09/22/20 at 130.  Patient to also schedule appointment with GYN for uterine bleeding and see MD for shoulder and neck pain.  . Self Care Activities: . Over the next 90 days, patient will:  -Self administers medications as prescribed Attends all scheduled provider appointments Calls pharmacy for medication refills Calls provider office for new concerns or questions  Follow Up Plan: The Managed Medicaid care management team will reach out to the patient again over the next 7 days.  The patient has been provided with contact information for the Managed Medicaid care management team and has been advised to call with any health related questions or concerns.    Evidence-based guidance:   Review biopsychosocial determinants of health screens.   Determine level of modifiable health risk.   Assess level of patient activation, level of readiness, importance and confidence to make changes.   Evoke change talk using open-ended questions, pros and cons, as well as looking forward.   Identify areas where behavior change [name] lead to improved health.   Partner with patient to develop a robust self-management plan that includes lifestyle factors, such as weight loss, exercise and healthy nutrition, as well as goals specific to disease risks.   Support patient and family/caregiver active participation in decision-making and self-management plan.   Implement additional goals and interventions based on identified risk factors to reduce health risk.   Facilitate advance care planning.   Review need for preventive screening based on age, sex, family history and health history.

## 2020-09-11 NOTE — Patient Outreach (Signed)
Medicaid Managed Care   Nurse Care Manager Note  09/11/2020 Name:  Melinda [name] MRN:  [id number] DOB:  1966-01-31  Melinda [name] is an 55 y.o. year old female who is a primary patient of Melinda Pier, MD.  The [facility] Managed Care Coordination team was consulted for assistance with:    chronic healthcare management needs.  Melinda [name] was given information about Medicaid Managed Care Coordination team services today. Melinda [name] agreed to services and verbal consent obtained.  Engaged with patient by telephone for follow up visit in response to provider referral for case management and/or care coordination services.   Assessments/Interventions:  Review of past medical history, allergies, medications, health status, including review of consultants reports, laboratory and other test data, was performed as part of comprehensive evaluation and provision of chronic care management services.  SDOH (Social Determinants of Health) assessments and interventions performed:   Care Plan  Allergies  Allergen Reactions  . Ceftriaxone Anaphylaxis    ROCEPHIN  . Penicillins Shortness Of Breath    Has patient had a PCN reaction causing immediate rash, facial/tongue/throat swelling, SOB or lightheadedness with hypotension: Yes Has patient had a PCN reaction causing severe rash involving mucus membranes or skin necrosis: No Has patient had a PCN reaction that required hospitalization No Has patient had a PCN reaction occurring within the last 10 years: No If all of the above answers are "NO", then [name] proceed with Cephalosporin use.   . Shellfish Allergy Anaphylaxis  . Flonase [Fluticasone Propionate]     Makes migraines worse  . Gold-Containing Drug Products     HANDS ITCH  . Nickel     HANDS SWELL  . Citrus Rash    Medications Reviewed Today    Reviewed by Gayla Medicus, RN (Registered Nurse) on 09/11/20 at 31  Med List Status: <None>  Medication Order Taking? Sig Documenting Provider  Last Dose Status Informant  albuterol (PROVENTIL) (2.5 MG/3ML) 0.083% nebulizer solution [id number] No Take 3 mLs (2.5 mg total) by nebulization every 6 (six) hours as needed for wheezing or shortness of breath. [name], [name], [name] Taking Active   atorvastatin (LIPITOR) 20 MG tablet [id number]  TAKE ONE TABLET BY MOUTH ONCE DAILY (BEDTIME) Melinda Pier, MD  Active   budesonide-formoterol [facility]) 160-4.5 MCG/ACT inhaler [id number] No Inhale 2 puffs into the lungs 2 (two) times daily. [name], [name], PA-C Taking Active   buPROPion (WELLBUTRIN XL) 300 MG 24 hr tablet [id number] No Take 300 mg by mouth daily.  [provider] Taking Active Self  busPIRone (BUSPAR) 5 MG tablet [id number] No Take 5 mg by mouth 3 (three) times daily. [provider] Taking Active Self           Med Note Melinda [name], [facility]   Wed Feb 18, 2018 10:03 PM)    citalopram (CELEXA) 40 MG tablet [id number] No Take 40 mg by mouth daily.  [provider] Taking Active Self  cyclobenzaprine (FLEXERIL) 10 MG tablet [id number]  TAKE 1 TABLET (10 MG TOTAL) BY MOUTH 3 (THREE) TIMES DAILY AS NEEDED FOR MUSCLE SPASMS. Melinda Pier, MD  Active   doxepin (SINEQUAN) 10 MG capsule [id number] No Take 10 mg by mouth. [provider] Taking Active   doxycycline (VIBRA-TABS) 100 MG tablet [id number]  Take 1 tablet (100 mg total) by mouth 2 (two) times daily. Melinda Pier, MD  Active   ELIQUIS 5 MG TABS tablet [id number]  TAKE ONE TABLET BY MOUTH TWICE DAILY MORNING  AND Melinda Rubins, MD  Active   gabapentin (NEURONTIN) 300 MG capsule [id number]  TAKE 1 CAPSULE (300 MG TOTAL) BY MOUTH 3 (THREE) TIMES DAILY. Melinda Pier, MD  Active   hydrochlorothiazide (MICROZIDE) 12.5 MG capsule [id number]  TAKE ONE CAPSULE BY MOUTH ONCE DAILY(AM) Melinda Pier, MD  Active   metoprolol tartrate (LOPRESSOR) 50 MG tablet [id number]  TAKE ONE TABLET BY MOUTH TWICE DAILY Melinda Pier,  MD  Active   montelukast (SINGULAIR) 10 MG tablet [id number]  TAKE 1 TABLET BY MOUTH EVERY EVENING AT BEDTIME Melinda Pier, MD  Active   PAZEO 0.7 % SOLN [id number] No   Patient not taking: Reported on 07/21/2020   [provider] Not Taking Active   prazosin (MINIPRESS) 1 MG capsule [id number] No Take 6 mg by mouth at bedtime. [provider] Taking Active Self  sodium chloride (OCEAN) 0.65 % SOLN nasal spray [id number]  Place 1 spray into both nostrils as needed for congestion. Melinda Pier, MD  Active   SYNTHROID 200 MCG tablet [id number] No 212 mcg.  [provider] Taking Active   topiramate (TOPAMAX) 200 MG tablet [id number] No TAKE 1 TABLET (200 MG TOTAL) BY MOUTH AT BEDTIME. Melinda Partridge, DO Taking Active   TRULICITY 1.59 EL/0.7AJ SOPN [id number] No Inject into the skin. [provider] Taking Active           Patient Active Problem List   Diagnosis Date Noted  . At high risk for falls 04/20/2020  . Hyperlipidemia 02/16/2020  . Adrenal mass, right ([place]) 04/29/2019  . Chronic cough 03/12/2018  . Left shoulder pain 10/14/2016  . Hypertension 10/14/2016  . S/P thyroidectomy 06/27/2016  . Chronic bilateral low back pain without sciatica 04/22/2016  . Liver lesion 12/24/2015  . Controlled type 2 diabetes mellitus with complication, with long-term current use of insulin ([place]) 10/10/2015  . Vision loss 08/25/2015  . Decreased vision in both eyes 08/25/2015  . Prolonged Q-T interval on ECG 08/19/2015  . History of pulmonary embolism 08/10/2015  . Mild persistent asthma 04/13/2015  . Fibroid, uterine   . Morbid obesity ([place]) 11/11/2014  . Atypical chest pain 11/08/2014  . Sinus tachycardia 11/08/2014  . Migraine 10/14/2014  . Menorrhagia 09/08/2014    Conditions to be addressed/monitored per PCP order:  chronic healthcare management needs,DM2, asthma, glaucoma,chronic low back pain, PTSD/anxiety, HTN, migraines.  Care Plan : General  Plan of Care (Adult)  Updates made by Gayla Medicus, RN since 09/11/2020 12:00 AM    Problem: Health Promotion or Disease Self-Management (General Plan of Care)     Long-Range Goal: Self-Management Plan Developed   Start Date: 07/14/2020  Expected End Date: 10/12/2020  Recent Progress: On track  Priority: Medium  Note:    Current Barriers:  . Chronic Disease Management support and education needs  . Patient complaining of nose bleeds and productive cough with green sputum. Patient has appointment with ENT 09/22/20 at 130. Marland Kitchen Patient with increased uterine bleeding-changing pad every 1-2 hours-h/o uterine fibroids.  To schedule appointment with GYN at [facility]. . Patient states she fell out of bed and now having shoulder and neck pain-to see MD today, 09/11/20.  Nurse Case Manager Clinical Goal(s):  Marland Kitchen Over the next 90 days, patient will attend all scheduled medical appointments:  . Update 09/11/20:  Patient is attending all appointments. . Over the next 30 days, patient will work with CM team pharmacist to review medications. Marland Kitchen  Update 08/14/20:  patient met with CM team pharmacist 07/21/20 and is following.  Interventions:  . Inter-disciplinary care team collaboration (see longitudinal plan of care) . Evaluation of current treatment plan  and patient's adherence to plan as established by provider. . Reviewed medications with patient. Nash Dimmer with pharmacy regarding medications. . Discussed plans with patient for ongoing care management follow up and provided patient with direct contact information for care management team . Reviewed scheduled/upcoming provider appointments. . Pharmacy referral for medication review. Marland Kitchen Update 08/14/20:  Encouraged patient to call PCP for evaluation of nose bleeds and productive cough. Marland Kitchen Update 09/11/20:  Patient evaluated by PCP 08/21/20, has appointment with ENT scheduled 09/22/20 at 130.  Patient to also schedule appointment with GYN  for uterine bleeding and see MD for shoulder and neck pain. .  . Self Care Activities: . Over the next 90 days, patient will:  -Self administers medications as prescribed Attends all scheduled provider appointments Calls pharmacy for medication refills Calls provider office for new concerns or questions  Follow Up Plan: The Managed Medicaid care management team will reach out to the patient again over the next 7 days.  The patient has been provided with contact information for the Managed Medicaid care management team and has been advised to call with any health related questions or concerns.      Evidence-based guidance:   Review biopsychosocial determinants of health screens.   Determine level of modifiable health risk.   Assess level of patient activation, level of readiness, importance and confidence to make changes.   Evoke change talk using open-ended questions, pros and cons, as well as looking forward.   Identify areas where behavior change [name] lead to improved health.   Partner with patient to develop a robust self-management plan that includes lifestyle factors, such as weight loss, exercise and healthy nutrition, as well as goals specific to disease risks.   Support patient and family/caregiver active participation in decision-making and self-management plan.   Implement additional goals and interventions based on identified risk factors to reduce health risk.   Facilitate advance care planning.   Review need for preventive screening based on age, sex, family history and health history.       Follow Up:  Patient agrees to Care Plan and Follow-up.  Plan: The Managed Medicaid care management team will reach out to the patient again over the next 7 days. and The patient has been provided with contact information for the Managed Medicaid care management team and has been advised to call with any health related questions or concerns.  Date/time of next scheduled RN  care management/care coordination outreach:  09/19/20 at 330.

## 2020-09-15 DIAGNOSIS — E890 Postprocedural hypothyroidism: Secondary | ICD-10-CM | POA: Diagnosis not present

## 2020-09-15 DIAGNOSIS — E119 Type 2 diabetes mellitus without complications: Secondary | ICD-10-CM | POA: Diagnosis not present

## 2020-09-15 DIAGNOSIS — Z85850 Personal history of malignant neoplasm of thyroid: Secondary | ICD-10-CM | POA: Diagnosis not present

## 2020-09-19 ENCOUNTER — Other Ambulatory Visit: Payer: Self-pay

## 2020-09-19 ENCOUNTER — Other Ambulatory Visit: Payer: Self-pay | Admitting: Obstetrics and Gynecology

## 2020-09-19 NOTE — Patient Instructions (Signed)
Hi Melinda [name], thank you for speaking with me today.  Melinda [name] was given information about Medicaid Managed Care team care coordination services as a part of their [place] Medicaid benefit. Melinda [name] verbally consented to engagement with the [facility] Managed Care team.   For questions related to your [facility], please call: [phone] or visit the homepage here: [email or web address]  If you would like to schedule transportation through your [facility], please call the following number at least 2 days in advance of your appointment: [phone].   Call the [place] at [phone], at any time, 24 hours a day, 7 days a week. If you are in danger or need immediate medical attention call 911.  Melinda [name] - following are the goals we discussed in your visit today:  Goals Addressed            This Visit's Progress   . Protect My Health       Timeframe:  Long-Range Goal Priority:  Medium Start Date:    07/14/20                         Expected End Date:    12/20/20               Follow Up Date 10/20/20   - schedule appointment for vaccines needed due to my age or health - schedule recommended health tests (blood work, mammogram, colonoscopy, pap test) - schedule and keep appointment for annual check-up  Update 08/14/20:  patient has scheduled appointment with PCP 08/21/20 Update 09/11/20:  patient attended appointment with PCP on 08/21/20.  Has appointment scheduled with ENT 09/22/20 due to nose bleeds.  Encouraged patient to schedule appointment with GYN due to bleeding. Update 09/19/20:  Patient states vaginal bleeding has stopped-still needs to schedule appointment with GYN.  Nosebleeds continue daily? More-has appointment 09/22/20 with ENT.      Patient verbalizes understanding of instructions provided today.   The Managed Medicaid care  management team will reach out to the patient again over the next 30 days.  The patient has been provided with contact information for the Managed Medicaid care management team and has been advised to call with any health related questions or concerns.   Aida Raider RN, BSN [place]  [facility] - Managed Medicaid High Risk [phone].  Following is a copy of your plan of care:    Patient Care Plan: General Plan of Care (Adult)    Problem Identified: Health Promotion or Disease Self-Management (General Plan of Care)     Long-Range Goal: Self-Management Plan Developed   Start Date: 07/14/2020  Expected End Date: 12/20/2020  Recent Progress: On track  Priority: Medium  Note:    Current Barriers:  . Chronic Disease Management support and education needs  . Patient complaining of nose bleeds and productive cough with green sputum. Patient has appointment with ENT 09/22/20 at 130. Marland Kitchen Update 09/19/20:  Patient states nose bleeds continue daily-? Increase.  Cough with clear sputum now. . Patient with increased uterine bleeding-changing pad every 1-2 hours-h/o uterine fibroids.  To schedule appointment with GYN at [facility]. Marland Kitchen Update 09/19/20:  Patient states uterine bleeding has stopped.  Patient still needs to schedule appointment with GYN. . Patient states she fell out of bed and now having shoulder and neck pain-to see MD today, 09/11/20. Marland Kitchen Update 09/19/20:  Patient states shoulder and neck pain have resolved and did not need to see MD-c/o left knee pain-using ice which is helping.  Nurse Case Manager Clinical Goal(s):  Marland Kitchen Over the next 90 days, patient will attend all scheduled medical appointments:  . Update 09/11/20:  Patient is attending all appointments. . Over the next 30 days, patient will work with CM team pharmacist to review medications. Marland Kitchen Update 08/14/20:  patient met with CM team pharmacist 07/21/20 and is  following.  Interventions:  . Inter-disciplinary care team collaboration (see longitudinal plan of care) . Evaluation of current treatment plan  and patient's adherence to plan as established by provider. . Reviewed medications with patient. Nash Dimmer with pharmacy regarding medications. . Discussed plans with patient for ongoing care management follow up and provided patient with direct contact information for care management team . Reviewed scheduled/upcoming provider appointments. . Pharmacy referral for medication review. Marland Kitchen Update 08/14/20:  Encouraged patient to call PCP for evaluation of nose bleeds and productive cough. Marland Kitchen Update 09/11/20:  Patient evaluated by PCP 08/21/20, has appointment with ENT scheduled 09/22/20 at 130.  Patient to also schedule appointment with GYN for uterine bleeding and see MD for shoulder and neck pain. Marland Kitchen Update 09/19/20:  Shoulder and neck pain improved-doesn't feel like she needs to see MD.  Still needs to schedule appointment with GYN. .  . Self Care Activities: . Over the next 90 days, patient will:  -Self administers medications as prescribed Attends all scheduled provider appointments Calls pharmacy for medication refills Calls provider office for new concerns or questions  Follow Up Plan: The Managed Medicaid care management team will reach out to the patient again over the next 30 days.  The patient has been provided with contact information for the Managed Medicaid care management team and has been advised to call with any health related questions or concerns.     Evidence-based guidance:   Review biopsychosocial determinants of health screens.   Determine level of modifiable health risk.   Assess level of patient activation, level of readiness, importance and confidence to make changes.   Evoke change talk using open-ended questions, pros and cons, as well as looking forward.   Identify areas where behavior change [name] lead to improved  health.   Partner with patient to develop a robust self-management plan that includes lifestyle factors, such as weight loss, exercise and healthy nutrition, as well as goals specific to disease risks.   Support patient and family/caregiver active participation in decision-making and self-management plan.   Implement additional goals and interventions based on identified risk factors to reduce health risk.   Facilitate advance care planning.   Review need for preventive screening based on age, sex, family history and health history.

## 2020-09-19 NOTE — Patient Outreach (Signed)
Medicaid Managed Care   Nurse Care Manager Note  09/19/2020 Name:  Melinda [name] MRN:  [id number] DOB:  07-09-1965  Melinda [name] is an 55 y.o. year old female who is a primary patient of [name], MD.  The [facility] Managed Care Coordination team was consulted for assistance with:    chronic healthcare management needs.  Melinda [name] was given information about Medicaid Managed Care Coordination team services today. [name] agreed to services and verbal consent obtained.  Engaged with patient by telephone for follow up visit in response to provider referral for case management and/or care coordination services.   Assessments/Interventions:  Review of past medical history, allergies, medications, health status, including review of consultants reports, laboratory and other test data, was performed as part of comprehensive evaluation and provision of chronic care management services.  SDOH (Social Determinants of Health) assessments and interventions performed:   Care Plan  Allergies  Allergen Reactions  . Ceftriaxone Anaphylaxis    ROCEPHIN  . Penicillins Shortness Of Breath    Has patient had a PCN reaction causing immediate rash, facial/tongue/throat swelling, SOB or lightheadedness with hypotension: Yes Has patient had a PCN reaction causing severe rash involving mucus membranes or skin necrosis: No Has patient had a PCN reaction that required hospitalization No Has patient had a PCN reaction occurring within the last 10 years: No If all of the above answers are "NO", then may proceed with Cephalosporin use.   . Shellfish Allergy Anaphylaxis  . Flonase [Fluticasone Propionate]     Makes migraines worse  . Gold-Containing Drug Products     HANDS ITCH  . Nickel     HANDS SWELL  . Citrus Rash    Medications Reviewed Today    Reviewed by Gayla Medicus, RN (Registered Nurse) on 09/19/20 at 1544  Med List Status: <None>  Medication Order Taking? Sig Documenting Provider  Last Dose Status Informant  albuterol (PROVENTIL) (2.5 MG/3ML) 0.083% nebulizer solution [id number] No Take 3 mLs (2.5 mg total) by nebulization every 6 (six) hours as needed for wheezing or shortness of breath. Tamzin, [place], [name] Taking Active   atorvastatin (LIPITOR) 20 MG tablet [id number]  TAKE ONE TABLET BY MOUTH ONCE DAILY (BEDTIME) [name], MD  Active   budesonide-formoterol [facility]) 160-4.5 MCG/ACT inhaler [id number] No Inhale 2 puffs into the lungs 2 (two) times daily. Amere, Iott M, PA-C Taking Active   buPROPion (WELLBUTRIN XL) 300 MG 24 hr tablet [id number] No Take 300 mg by mouth daily.  [provider] Taking Active Self  busPIRone (BUSPAR) 5 MG tablet [id number] No Take 5 mg by mouth 3 (three) times daily. [provider] Taking Active Self           Med Note [name], [facility]   Wed Feb 18, 2018 10:03 PM)    citalopram (CELEXA) 40 MG tablet [id number] No Take 40 mg by mouth daily.  [provider] Taking Active Self  cyclobenzaprine (FLEXERIL) 10 MG tablet [id number]  TAKE 1 TABLET (10 MG TOTAL) BY MOUTH 3 (THREE) TIMES DAILY AS NEEDED FOR MUSCLE SPASMS. [name], MD  Active   doxepin (SINEQUAN) 10 MG capsule [id number] No Take 10 mg by mouth. [provider] Taking Active   doxycycline (VIBRA-TABS) 100 MG tablet [id number]  Take 1 tablet (100 mg total) by mouth 2 (two) times daily. [name], MD  Active   ELIQUIS 5 MG TABS tablet [id number]  TAKE ONE TABLET BY MOUTH TWICE DAILY MORNING  AND [name], MD  Active   gabapentin (NEURONTIN) 300 MG capsule [id number]  TAKE 1 CAPSULE (300 MG TOTAL) BY MOUTH 3 (THREE) TIMES DAILY. [name], MD  Active   hydrochlorothiazide (MICROZIDE) 12.5 MG capsule [id number]  TAKE ONE CAPSULE BY MOUTH ONCE DAILY(AM) [name], MD  Active   metoprolol tartrate (LOPRESSOR) 50 MG tablet [id number]  TAKE ONE TABLET BY MOUTH TWICE DAILY [name],  MD  Active   montelukast (SINGULAIR) 10 MG tablet [id number]  TAKE 1 TABLET BY MOUTH EVERY EVENING AT BEDTIME [name], MD  Active   PAZEO 0.7 % SOLN [id number] No   Patient not taking: Reported on 07/21/2020   [provider] Not Taking Active   prazosin (MINIPRESS) 1 MG capsule [id number] No Take 6 mg by mouth at bedtime. [provider] Taking Active Self  sodium chloride (OCEAN) 0.65 % SOLN nasal spray [id number]  Place 1 spray into both nostrils as needed for congestion. [name], MD  Active   SYNTHROID 200 MCG tablet [id number] No 212 mcg.  [provider] Taking Active   topiramate (TOPAMAX) 200 MG tablet [id number] No TAKE 1 TABLET (200 MG TOTAL) BY MOUTH AT BEDTIME. [name], DO Taking Active   TRULICITY 4.92 EF/0.0FH SOPN [id number] No Inject 1.5 mg into the skin. [provider] Taking Active Self          Patient Active Problem List   Diagnosis Date Noted  . At high risk for falls 04/20/2020  . Hyperlipidemia 02/16/2020  . Adrenal mass, right ([place]) 04/29/2019  . Chronic cough 03/12/2018  . Left shoulder pain 10/14/2016  . Hypertension 10/14/2016  . S/P thyroidectomy 06/27/2016  . Chronic bilateral low back pain without sciatica 04/22/2016  . Liver lesion 12/24/2015  . Controlled type 2 diabetes mellitus with complication, with long-term current use of insulin ([place]) 10/10/2015  . Vision loss 08/25/2015  . Decreased vision in both eyes 08/25/2015  . Prolonged Q-T interval on ECG 08/19/2015  . History of pulmonary embolism 08/10/2015  . Mild persistent asthma 04/13/2015  . Fibroid, uterine   . Morbid obesity ([place]) 11/11/2014  . Atypical chest pain 11/08/2014  . Sinus tachycardia 11/08/2014  . Migraine 10/14/2014  . Menorrhagia 09/08/2014    Conditions to be addressed/monitored per PCP order:  chronic healthcare management needs-DM2, neuropathy, asthma, glaucoma, chronic low back pain, anxiety, HTN,  migraines.  Care Plan : General Plan of Care (Adult)  Updates made by Gayla Medicus, RN since 09/19/2020 12:00 AM    Problem: Health Promotion or Disease Self-Management (General Plan of Care)     Long-Range Goal: Self-Management Plan Developed   Start Date: 07/14/2020  Expected End Date: 12/20/2020  Recent Progress: On track  Priority: Medium  Note:    Current Barriers:  . Chronic Disease Management support and education needs  . Patient complaining of nose bleeds and productive cough with green sputum. Patient has appointment with ENT 09/22/20 at 130. Marland Kitchen Update 09/19/20:  Patient states nose bleeds continue daily-? Increase.  Cough with clear sputum now. . Patient with increased uterine bleeding-changing pad every 1-2 hours-h/o uterine fibroids.  To schedule appointment with G[facility] at [facility]. Marland Kitchen Update 09/19/20:  Patient states uterine bleeding has stopped.  Patient still needs to schedule appointment with GYN. . Patient states she fell out of bed and now having shoulder and neck pain-to see MD today, 09/11/20. Marland Kitchen Update 09/19/20:  Patient states  shoulder and neck pain have resolved and did not need to see MD-c/o left knee pain-using ice which is helping.  Nurse Case Manager Clinical Goal(s):  Marland Kitchen Over the next 90 days, patient will attend all scheduled medical appointments:  . Update 09/11/20:  Patient is attending all appointments. . Over the next 30 days, patient will work with CM team pharmacist to review medications. Marland Kitchen Update 08/14/20:  patient met with CM team pharmacist 07/21/20 and is following.  Interventions:  . Inter-disciplinary care team collaboration (see longitudinal plan of care) . Evaluation of current treatment plan  and patient's adherence to plan as established by provider. . Reviewed medications with patient. Nash Dimmer with pharmacy regarding medications. . Discussed plans with patient for ongoing care management follow up and provided patient with  direct contact information for care management team . Reviewed scheduled/upcoming provider appointments. . Pharmacy referral for medication review. Marland Kitchen Update 08/14/20:  Encouraged patient to call PCP for evaluation of nose bleeds and productive cough. Marland Kitchen Update 09/11/20:  Patient evaluated by PCP 08/21/20, has appointment with ENT scheduled 09/22/20 at 130.  Patient to also schedule appointment with GYN for uterine bleeding and see MD for shoulder and neck pain. Marland Kitchen Update 09/19/20:  Shoulder and neck pain improved-doesn't feel like she needs to see MD.  Still needs to schedule appointment with GYN. .  . Self Care Activities: . Over the next 90 days, patient will:  -Self administers medications as prescribed Attends all scheduled provider appointments Calls pharmacy for medication refills Calls provider office for new concerns or questions  Follow Up Plan: The Managed Medicaid care management team will reach out to the patient again over the next 30 days.  The patient has been provided with contact information for the Managed Medicaid care management team and has been advised to call with any health related questions or concerns.      Evidence-based guidance:   Review biopsychosocial determinants of health screens.   Determine level of modifiable health risk.   Assess level of patient activation, level of readiness, importance and confidence to make changes.   Evoke change talk using open-ended questions, pros and cons, as well as looking forward.   Identify areas where behavior change may lead to improved health.   Partner with patient to develop a robust self-management plan that includes lifestyle factors, such as weight loss, exercise and healthy nutrition, as well as goals specific to disease risks.   Support patient and family/caregiver active participation in decision-making and self-management plan.   Implement additional goals and interventions based on identified risk factors to  reduce health risk.   Facilitate advance care planning.   Review need for preventive screening based on age, sex, family history and health history.      Follow Up:  Patient agrees to Care Plan and Follow-up.  Plan: The Managed Medicaid care management team will reach out to the patient again over the next 30 days. and The patient has been provided with contact information for the Managed Medicaid care management team and has been advised to call with any health related questions or concerns.  Date/time of next scheduled RN care management/care coordination outreach:  10/20/20 at 1245.

## 2020-09-22 DIAGNOSIS — R040 Epistaxis: Secondary | ICD-10-CM | POA: Diagnosis not present

## 2020-09-26 ENCOUNTER — Other Ambulatory Visit: Payer: Self-pay

## 2020-09-26 ENCOUNTER — Other Ambulatory Visit: Payer: Self-pay | Admitting: Obstetrics and Gynecology

## 2020-09-26 NOTE — Patient Outreach (Signed)
Care Coordination  09/26/2020  Melinda [name] 1966-02-11 [id number]   RNCM returned patient's phone call.  Patient called to update RNCM about her appointment with Dr. Benjamine Mola and to update Synthroid dose-changed to 175 mcg-medication list updated with new dosage.  Aida Raider RN, BSN [place]  [facility] - Managed Medicaid High Risk [phone].

## 2020-10-05 ENCOUNTER — Other Ambulatory Visit: Payer: Self-pay | Admitting: Internal Medicine

## 2020-10-05 DIAGNOSIS — S46011D Strain of muscle(s) and tendon(s) of the rotator cuff of right shoulder, subsequent encounter: Secondary | ICD-10-CM

## 2020-10-20 ENCOUNTER — Other Ambulatory Visit: Payer: Self-pay | Admitting: Obstetrics and Gynecology

## 2020-10-20 ENCOUNTER — Other Ambulatory Visit: Payer: Self-pay

## 2020-10-20 NOTE — Patient Instructions (Signed)
Hi Melinda [name], thank you for speaking with me today.  Melinda [name] was given information about Medicaid Managed Care team care coordination services as a part of their [place] Medicaid benefit. Melinda [name] verbally consented to engagement with the [facility] Managed Care team.   For questions related to your [facility], please call: [phone] or visit the homepage here: [email or web address]  If you would like to schedule transportation through your [facility], please call the following number at least 2 days in advance of your appointment: [phone].   Call the [place] at [phone], at any time, 24 hours a day, 7 days a week. If you are in danger or need immediate medical attention call 911.  Melinda [name] - following are the goals we discussed in your visit today:  Goals Addressed            This Visit's Progress   . Protect My Health       Timeframe:  Long-Range Goal Priority:  Medium Start Date:    07/14/20                         Expected End Date:    12/20/20               Follow Up Date 11/19/20 - schedule appointment for vaccines needed due to my age or health - schedule recommended health tests (blood work, mammogram, colonoscopy, pap test) - schedule and keep appointment for annual check-up  Update 08/14/20:  patient has scheduled appointment with PCP 08/21/20 Update 09/11/20:  patient attended appointment with PCP on 08/21/20.  Has appointment scheduled with ENT 09/22/20 due to nose bleeds.  Encouraged patient to schedule appointment with GYN due to bleeding. Update 09/19/20:  Patient states vaginal bleeding has stopped-still needs to schedule appointment with GYN.  Nosebleeds continue daily? More-has appointment 09/22/20 with ENT. Update 10/20/20:  Nose bleeds have stopped-has follow up appointment with Dr. Benjamine Mola on 10/24/20.  Has appointment  with Dr. Wynetta Emery for GYN follow up.    Patient verbalizes understanding of instructions provided today.   The Managed Medicaid care management team will reach out to the patient again over the next 30 days.  The patient has been provided with contact information for the Managed Medicaid care management team and has been advised to call with any health related questions or concerns.   Melinda Raider RN, BSN [place]  [facility] [employer]ator - Managed Medicaid High Risk [phone].  Following is a copy of your plan of care:  Patient Care Plan: General Plan of Care (Adult)    Problem Identified: Health Promotion or Disease Self-Management (General Plan of Care)   Priority: Medium  Onset Date: 07/14/2020    Patient Care Plan: General Plan of Care (Adult)    Problem Identified: Health Promotion or Disease Self-Management (General Plan of Care)   Priority: Medium  Onset Date: 10/20/2020    Long-Range Goal: Self-Management Plan Developed   Start Date: 07/14/2020  Expected End Date: 12/20/2020  Recent Progress: On track  Priority: Medium  Note:    Current Barriers:  . Chronic Disease Management support and education needs  . Patient with increased anxiety and depression-has appointment with Dr. [name] scheduled. . Patient complaining of nose bleeds and productive cough with green sputum. Patient has appointment with ENT 09/22/20 at 130. Melinda [name] Update 09/19/20:  Patient states nose bleeds continue daily-?  Increase.  Cough with clear sputum now. Melinda [name] Update 10/20/20:  Patient states nose bleeds have improved after seeing Dr. Benjamine Mola, no cough. . Patient with increased uterine bleeding-changing pad every 1-2 hours-h/o uterine fibroids.  To schedule appointment with GYN at [facility]. Melinda [name] Update 09/19/20:  Patient states uterine bleeding has stopped.  Patient still needs to schedule appointment with GYN. Melinda [name] Update 10/20/20:  No bleeding reported-has  appointment with Dr. Wynetta Emery for follow up. . Patient states she fell out of bed and now having shoulder and neck pain-to see MD today, 09/11/20. Melinda [name] Update 09/19/20:  Patient states shoulder and neck pain have resolved and did not need to see MD-c/o left knee pain-using ice which is helping. Melinda [name] Update 10/20/20:  Some knee pain continues-to see provider as needed.  Nurse Case Manager Clinical Goal(s):  Melinda [name] Over the next 90 days, patient will attend all scheduled medical appointments:  . Update 09/11/20:  Patient is attending all appointments. . Over the next 30 days, patient will work with CM team pharmacist to review medications. Melinda [name] Update 08/14/20:  patient met with CM team pharmacist 07/21/20 and is following.  Interventions:  . Inter-disciplinary care team collaboration (see longitudinal plan of care) . Evaluation of current treatment plan  and patient's adherence to plan as established by provider. . Reviewed medications with patient. Nash Dimmer with pharmacy regarding medications. . Discussed plans with patient for ongoing care management follow up and provided patient with direct contact information for care management team . Reviewed scheduled/upcoming provider appointments. . Pharmacy referral for medication review. Melinda [name] Update 08/14/20:  Encouraged patient to call PCP for evaluation of nose bleeds and productive cough. Melinda [name] Update 09/11/20:  Patient evaluated by PCP 08/21/20, has appointment with ENT scheduled 09/22/20 at 130.  Patient to also schedule appointment with GYN for uterine bleeding and see MD for shoulder and neck pain. Melinda [name] Update 09/19/20:  Shoulder and neck pain improved-doesn't feel like she needs to see MD.  Still needs to schedule appointment with GYN. Melinda [name] Update 10/20/20:  Patient has an appointment with Dr. Wynetta Emery for GYN evaluation. .  . Self Care Activities: . Over the next 90 days, patient will:  -Self administers medications as prescribed Attends all scheduled provider  appointments Calls pharmacy for medication refills Calls provider office for new concerns or questions  Follow Up Plan: The Managed Medicaid care management team will reach out to the patient again over the next 30 days.  The patient has been provided with contact information for the Managed Medicaid care management team and has been advised to call with any health related questions or concerns.    Evidence-based guidance:   Review biopsychosocial determinants of health screens.   Determine level of modifiable health risk.   Assess level of patient activation, level of readiness, importance and confidence to make changes.   Evoke change talk using open-ended questions, pros and cons, as well as looking forward.   Identify areas where behavior change [name] lead to improved health.   Partner with patient to develop a robust self-management plan that includes lifestyle factors, such as weight loss, exercise and healthy nutrition, as well as goals specific to disease risks.   Support patient and family/caregiver active participation in decision-making and self-management plan.   Implement additional goals and interventions based on identified risk factors to reduce health risk.   Facilitate advance care planning.   Review need for preventive screening based on age, sex, family history and health history.

## 2020-10-20 NOTE — Patient Outreach (Signed)
Medicaid Managed Care   Nurse Care Manager Note  10/20/2020 Name:  Melinda [name] MRN:  [id number] DOB:  [birth year]  Melinda [name] is an 55 y.o. year old female who is a primary patient of [name], MD.  The [facility] Managed Care Coordination team was consulted for assistance with:    chronic healthcare management needs.  Melinda [name] was given information about Medicaid Managed Care Coordination team services today. Melinda [name] agreed to services and verbal consent obtained.  Engaged with patient by telephone for follow up visit in response to provider referral for case management and/or care coordination services.   Assessments/Interventions:  Review of past medical history, allergies, medications, health status, including review of consultants reports, laboratory and other test data, was performed as part of comprehensive evaluation and provision of chronic care management services.  SDOH (Social Determinants of Health) assessments and interventions performed:   Care Plan  Allergies  Allergen Reactions  . Ceftriaxone Anaphylaxis    ROCEPHIN  . Penicillins Shortness Of Breath    Has patient had a PCN reaction causing immediate rash, facial/tongue/throat swelling, SOB or lightheadedness with hypotension: Yes Has patient had a PCN reaction causing severe rash involving mucus membranes or skin necrosis: No Has patient had a PCN reaction that required hospitalization No Has patient had a PCN reaction occurring within the last 10 years: No If all of the above answers are "NO", then [name] proceed with Cephalosporin use.   . Shellfish Allergy Anaphylaxis  . Flonase [Fluticasone Propionate]     Makes migraines worse  . Gold-Containing Drug Products     HANDS ITCH  . Nickel     HANDS SWELL  . Citrus Rash    Medications Reviewed Today    Reviewed by Gayla Medicus, RN (Registered Nurse) on 10/20/20 at 1159  Med List Status: <None>  Medication Order Taking? Sig Documenting Provider  Last Dose Status Informant  albuterol (PROVENTIL) (2.5 MG/3ML) 0.083% nebulizer solution [id number] No Take 3 mLs (2.5 mg total) by nebulization every 6 (six) hours as needed for wheezing or shortness of breath. [place], [place], [name] Taking Active   atorvastatin (LIPITOR) 20 MG tablet [id number]  TAKE ONE TABLET BY MOUTH ONCE DAILY (BEDTIME) [name], MD  Active   budesonide-formoterol [facility]) 160-4.5 MCG/ACT inhaler [id number] No Inhale 2 puffs into the lungs 2 (two) times daily. Brita, Jurg[place] M, PA-C Taking Active   buPROPion (WELLBUTRIN XL) 300 MG 24 hr tablet [id number] No Take 300 mg by mouth daily.  [provider] Taking Active Self  busPIRone (BUSPAR) 5 MG tablet [id number] No Take 5 mg by mouth 3 (three) times daily. [provider] Taking Active Self           Med Note Melinda [name], [facility]   Wed Feb 18, 2018 10:03 PM)    citalopram (CELEXA) 40 MG tablet [id number] No Take 40 mg by mouth daily.  [provider] Taking Active Self  cyclobenzaprine (FLEXERIL) 10 MG tablet [id number]  TAKE 1 TABLET (10 MG TOTAL) BY MOUTH 3 (THREE) TIMES DAILY AS NEEDED FOR MUSCLE SPASMS. [name], MD  Active   doxepin (SINEQUAN) 10 MG capsule [id number] No Take 10 mg by mouth. [provider] Taking Active   doxycycline (VIBRA-TABS) 100 MG tablet [id number]  Take 1 tablet (100 mg total) by mouth 2 (two) times daily. [name], MD  Active   ELIQUIS 5 MG TABS tablet [id number]  TAKE ONE TABLET BY MOUTH TWICE DAILY MORNING  AND Melinda Rubins, MD  Active   gabapentin (NEURONTIN) 300 MG capsule [id number]  TAKE 1 CAPSULE (300 MG TOTAL) BY MOUTH 3 (THREE) TIMES DAILY. [name], MD  Active   hydrochlorothiazide (MICROZIDE) 12.5 MG capsule [id number]  TAKE ONE CAPSULE BY MOUTH ONCE DAILY(AM) [name], MD  Active   metoprolol tartrate (LOPRESSOR) 50 MG tablet [id number]  TAKE ONE TABLET BY MOUTH TWICE DAILY [name],  MD  Active   montelukast (SINGULAIR) 10 MG tablet [id number]  TAKE 1 TABLET BY MOUTH EVERY EVENING AT BEDTIME [name], MD  Active   PAZEO 0.7 % SOLN [id number] No   Patient not taking: Reported on 07/21/2020   [provider] Not Taking Active   prazosin (MINIPRESS) 1 MG capsule [id number] No Take 6 mg by mouth at bedtime. [provider] Taking Active Self  sodium chloride (OCEAN) 0.65 % SOLN nasal spray [id number]  Place 1 spray into both nostrils as needed for congestion. [name], MD  Active   SYNTHROID 200 MCG tablet [id number] No 175 mcg. [provider] Taking Active Self  topiramate (TOPAMAX) 200 MG tablet [id number] No TAKE 1 TABLET (200 MG TOTAL) BY MOUTH AT BEDTIME. Melinda Partridge, DO Taking Active   TRULICITY 8.88 KC/0.0LK SOPN [id number] No Inject 1.5 mg into the skin. [provider] Taking Active Self          Patient Active Problem List   Diagnosis Date Noted  . At high risk for falls 04/20/2020  . Hyperlipidemia 02/16/2020  . Adrenal mass, right ([place]) 04/29/2019  . Chronic cough 03/12/2018  . Left shoulder pain 10/14/2016  . Hypertension 10/14/2016  . S/P thyroidectomy 06/27/2016  . Chronic bilateral low back pain without sciatica 04/22/2016  . Liver lesion 12/24/2015  . Controlled type 2 diabetes mellitus with complication, with long-term current use of insulin ([place]) 10/10/2015  . Vision loss 08/25/2015  . Decreased vision in both eyes 08/25/2015  . Prolonged Q-T interval on ECG 08/19/2015  . History of pulmonary embolism 08/10/2015  . Mild persistent asthma 04/13/2015  . Fibroid, uterine   . Morbid obesity ([place]) 11/11/2014  . Atypical chest pain 11/08/2014  . Sinus tachycardia 11/08/2014  . Migraine 10/14/2014  . Menorrhagia 09/08/2014    Conditions to be addressed/monitored per PCP order:  DM2, neuropathy, asthma, glaucoma, chronic low back pain, anxiety, HTN, migraines.  Care Plan : General Plan of Care  (Adult)  Updates made by Gayla Medicus, RN since 10/20/2020 12:00 AM    Problem: Health Promotion or Disease Self-Management (General Plan of Care)   Priority: Medium  Onset Date: 10/20/2020    Long-Range Goal: Self-Management Plan Developed   Start Date: 07/14/2020  Expected End Date: 12/20/2020  Recent Progress: On track  Priority: Medium  Note:    Current Barriers:  . Chronic Disease Management support and education needs  . Patient with increased anxiety and depression-has appointment with Dr. [name] scheduled. . Patient complaining of nose bleeds and productive cough with green sputum. Patient has appointment with ENT 09/22/20 at 130. Marland Kitchen Update 09/19/20:  Patient states nose bleeds continue daily-? Increase.  Cough with clear sputum now. Marland Kitchen Update 10/20/20:  Patient states nose bleeds have improved after seeing Dr. Benjamine Mola, no cough. . Patient with increased uterine bleeding-changing pad every 1-2 hours-h/o uterine fibroids.  To schedule appointment with GYN at [facility]. Marland Kitchen Update 09/19/20:  Patient states uterine bleeding has stopped.  Patient still  needs to schedule appointment with GYN. Marland Kitchen Update 10/20/20:  No bleeding reported-has appointment with Dr. Wynetta Emery for follow up. . Patient states she fell out of bed and now having shoulder and neck pain-to see MD today, 09/11/20. Marland Kitchen Update 09/19/20:  Patient states shoulder and neck pain have resolved and did not need to see MD-c/o left knee pain-using ice which is helping. Marland Kitchen Update 10/20/20:  Some knee pain continues-to see provider as needed.  Nurse Case Manager Clinical Goal(s):  Marland Kitchen Over the next 90 days, patient will attend all scheduled medical appointments:  . Update 09/11/20:  Patient is attending all appointments. . Over the next 30 days, patient will work with CM team pharmacist to review medications. Marland Kitchen Update 08/14/20:  patient met with CM team pharmacist 07/21/20 and is following.  Interventions:  . Inter-disciplinary  care team collaboration (see longitudinal plan of care) . Evaluation of current treatment plan  and patient's adherence to plan as established by provider. . Reviewed medications with patient. Nash Dimmer with pharmacy regarding medications. . Discussed plans with patient for ongoing care management follow up and provided patient with direct contact information for care management team . Reviewed scheduled/upcoming provider appointments. . Pharmacy referral for medication review. Marland Kitchen Update 08/14/20:  Encouraged patient to call PCP for evaluation of nose bleeds and productive cough. Marland Kitchen Update 09/11/20:  Patient evaluated by PCP 08/21/20, has appointment with ENT scheduled 09/22/20 at 130.  Patient to also schedule appointment with GYN for uterine bleeding and see MD for shoulder and neck pain. Marland Kitchen Update 09/19/20:  Shoulder and neck pain improved-doesn't feel like she needs to see MD.  Still needs to schedule appointment with GYN. Marland Kitchen Update 10/20/20:  Patient has an appointment with Dr. Wynetta Emery for GYN evaluation. .  . Self Care Activities: . Over the next 90 days, patient will:  -Self administers medications as prescribed Attends all scheduled provider appointments Calls pharmacy for medication refills Calls provider office for new concerns or questions  Follow Up Plan: The Managed Medicaid care management team will reach out to the patient again over the next 30 days.  The patient has been provided with contact information for the Managed Medicaid care management team and has been advised to call with any health related questions or concerns.     Evidence-based guidance:   Review biopsychosocial determinants of health screens.   Determine level of modifiable health risk.   Assess level of patient activation, level of readiness, importance and confidence to make changes.   Evoke change talk using open-ended questions, pros and cons, as well as looking forward.   Identify areas where behavior  change [name] lead to improved health.   Partner with patient to develop a robust self-management plan that includes lifestyle factors, such as weight loss, exercise and healthy nutrition, as well as goals specific to disease risks.   Support patient and family/caregiver active participation in decision-making and self-management plan.   Implement additional goals and interventions based on identified risk factors to reduce health risk.   Facilitate advance care planning.   Review need for preventive screening based on age, sex, family history and health history.      Follow Up:  Patient agrees to Care Plan and Follow-up.  Plan: The Managed Medicaid care management team will reach out to the patient again over the next 30 days. and The patient has been provided with contact information for the Managed Medicaid care management team and has been advised to call with any health  related questions or concerns.  Date/time of next scheduled RN care management/care coordination outreach:  11/09/20 at 1245.

## 2020-10-24 DIAGNOSIS — R040 Epistaxis: Secondary | ICD-10-CM | POA: Diagnosis not present

## 2020-11-03 ENCOUNTER — Other Ambulatory Visit: Payer: Self-pay | Admitting: Internal Medicine

## 2020-11-03 DIAGNOSIS — S46011D Strain of muscle(s) and tendon(s) of the rotator cuff of right shoulder, subsequent encounter: Secondary | ICD-10-CM

## 2020-11-06 DIAGNOSIS — F331 Major depressive disorder, recurrent, moderate: Secondary | ICD-10-CM | POA: Diagnosis not present

## 2020-11-06 DIAGNOSIS — F4310 Post-traumatic stress disorder, unspecified: Secondary | ICD-10-CM | POA: Diagnosis not present

## 2020-11-06 DIAGNOSIS — F411 Generalized anxiety disorder: Secondary | ICD-10-CM | POA: Diagnosis not present

## 2020-11-09 ENCOUNTER — Other Ambulatory Visit: Payer: Self-pay

## 2020-11-09 ENCOUNTER — Other Ambulatory Visit: Payer: Self-pay | Admitting: Obstetrics and Gynecology

## 2020-11-09 NOTE — Patient Instructions (Signed)
Hi Ms. Leder, thank you for speaking with me today.  Ms. Prickett was given information about Medicaid Managed Care team care coordination services as a part of their [place] Medicaid benefit. [name] verbally consented to engagement with the [facility] Managed Care team.   For questions related to your [facility], please call: [phone] or visit the homepage here: [email or web address]  If you would like to schedule transportation through your [facility], please call the following number at least 2 days in advance of your appointment: [phone].   Call the [place] at [phone], at any time, 24 hours a day, 7 days a week. If you are in danger or need immediate medical attention call 911.  Ms. Muckle - following are the goals we discussed in your visit today:  Goals Addressed            This Visit's Progress   . Protect My Health       Timeframe:  Long-Range Goal Priority:  Medium Start Date:    07/14/20                         Expected End Date:    12/20/20               Follow Up Date 12/10/20 - schedule appointment for vaccines needed due to my age or health - schedule recommended health tests (blood work, mammogram, colonoscopy, pap test) - schedule and keep appointment for annual check-up  Update 08/14/20:  patient has scheduled appointment with PCP 08/21/20 Update 09/11/20:  patient attended appointment with PCP on 08/21/20.  Has appointment scheduled with ENT 09/22/20 due to nose bleeds.  Encouraged patient to schedule appointment with GYN due to bleeding. Update 09/19/20:  Patient states vaginal bleeding has stopped-still needs to schedule appointment with GYN.  Nosebleeds continue daily? More-has appointment 09/22/20 with ENT. Update 10/20/20:  Nose bleeds have stopped-has follow up appointment with Dr. Benjamine Mola on 10/24/20.  Has appointment  with Dr. Wynetta Emery for GYN follow up. Update 11/09/20:  Patient attending all appointments-has blood work scheduled next week.  Appointment with Dr. Wynetta Emery 12/19/20.      Patient verbalizes understanding of instructions provided today.   The Managed Medicaid care management team will reach out to the patient again over the next 30 days.  The patient has been provided with contact information for the Managed Medicaid care management team and has been advised to call with any health related questions or concerns.   Aida Raider RN, BSN   [facility] - Managed Medicaid High Risk [phone].  Following is a copy of your plan of care:    Patient Care Plan: General Plan of Care (Adult)    Problem Identified: Health Promotion or Disease Self-Management (General Plan of Care)   Priority: Medium  Onset Date: 10/20/2020    Long-Range Goal: Self-Management Plan Developed   Start Date: 07/14/2020  Expected End Date: 12/20/2020  Recent Progress: On track  Priority: Medium  Note:    Current Barriers:  . Chronic Disease Management support and education needs  . Patient with increased anxiety and depression-has appointment with Dr. [name] scheduled every 3 months-psychotherapy recommended. . Patient complaining of nose bleeds and productive cough with green sputum. Patient has appointment with ENT 09/22/20 at 130. Marland Kitchen Update 09/19/20:  Patient states nose bleeds continue daily-? Increase.  Cough with clear sputum now. Marland Kitchen  Update 10/20/20:  Patient states nose bleeds have improved after seeing Dr. Benjamine Mola, no cough. . Patient with increased uterine bleeding-changing pad every 1-2 hours-h/o uterine fibroids.  To schedule appointment with GYN at [facility]. Marland Kitchen Update 09/19/20:  Patient states uterine bleeding has stopped.  Patient still needs to schedule appointment with GYN. Marland Kitchen Update 10/20/20:  No bleeding reported-has appointment with Dr.  Wynetta Emery for follow up. . Patient states she fell out of bed and now having shoulder and neck pain-to see MD today, 09/11/20. Marland Kitchen Update 09/19/20:  Patient states shoulder and neck pain have resolved and did not need to see MD-c/o left knee pain-using ice which is helping. Marland Kitchen Update 10/20/20:  Some knee pain continues-to see provider as needed.  Nurse Case Manager Clinical Goal(s):  Marland Kitchen Over the next 90 days, patient will attend all scheduled medical appointments:  . Update 09/11/20:  Patient is attending all appointments. . Over the next 30 days, patient will work with CM team pharmacist to review medications. Marland Kitchen Update 08/14/20:  patient met with CM team pharmacist 07/21/20 and is following.  Interventions:  . Inter-disciplinary care team collaboration (see longitudinal plan of care) . Evaluation of current treatment plan  and patient's adherence to plan as established by provider. . Reviewed medications with patient. Nash Dimmer with pharmacy regarding medications. . Discussed plans with patient for ongoing care management follow up and provided patient with direct contact information for care management team . Reviewed scheduled/upcoming provider appointments. Nash Dimmer with SW for psychotherapy referral. . SW referral for psychotherapy provider. . Pharmacy referral for medication review. Marland Kitchen Update 08/14/20:  Encouraged patient to call PCP for evaluation of nose bleeds and productive cough. Marland Kitchen Update 09/11/20:  Patient evaluated by PCP 08/21/20, has appointment with ENT scheduled 09/22/20 at 130.  Patient to also schedule appointment with GYN for uterine bleeding and see MD for shoulder and neck pain. Marland Kitchen Update 09/19/20:  Shoulder and neck pain improved-doesn't feel like she needs to see MD.  Still needs to schedule appointment with GYN. Marland Kitchen Update 10/20/20:  Patient has an appointment with Dr. Wynetta Emery for GYN evaluation. .  . Self Care Activities: . Over the next 90 days, patient will:  -Self  administers medications as prescribed Attends all scheduled provider appointments Calls pharmacy for medication refills Calls provider office for new concerns or questions  Follow Up Plan: The Managed Medicaid care management team will reach out to the patient again over the next 30 days.  The patient has been provided with contact information for the Managed Medicaid care management team and has been advised to call with any health related questions or concerns.   Evidence-based guidance:   Review biopsychosocial determinants of health screens.   Determine level of modifiable health risk.   Assess level of patient activation, level of readiness, importance and confidence to make changes.   Evoke change talk using open-ended questions, pros and cons, as well as looking forward.   Identify areas where behavior change may lead to improved health.   Partner with patient to develop a robust self-management plan that includes lifestyle factors, such as weight loss, exercise and healthy nutrition, as well as goals specific to disease risks.   Support patient and family/caregiver active participation in decision-making and self-management plan.   Implement additional goals and interventions based on identified risk factors to reduce health risk.   Facilitate advance care planning.   Review need for preventive screening based on age, sex, family history and  health history.

## 2020-11-09 NOTE — Patient Outreach (Signed)
Medicaid Managed Care   Nurse Care Manager Note  11/09/2020 Name:  Melinda [name] MRN:  [id number] DOB:  1965-11-02  Melinda [name] is an 55 y.o. year old female who is a primary patient of [name], MD.  The [facility] Managed Care Coordination team was consulted for assistance with:    chronic healthcare management needs.  Melinda [name] was given information about Medicaid Managed Care Coordination team services today. Melinda [name] agreed to services and verbal consent obtained.  Engaged with patient by telephone for follow up visit in response to provider referral for case management and/or care coordination services.   Assessments/Interventions:  Review of past medical history, allergies, medications, health status, including review of consultants reports, laboratory and other test data, was performed as part of comprehensive evaluation and provision of chronic care management services.  SDOH (Social Determinants of Health) assessments and interventions performed:   Care Plan  Allergies  Allergen Reactions  . Ceftriaxone Anaphylaxis    ROCEPHIN  . Penicillins Shortness Of Breath    Has patient had a PCN reaction causing immediate rash, facial/tongue/throat swelling, SOB or lightheadedness with hypotension: Yes Has patient had a PCN reaction causing severe rash involving mucus membranes or skin necrosis: No Has patient had a PCN reaction that required hospitalization No Has patient had a PCN reaction occurring within the last 10 years: No If all of the above answers are "NO", then [name] proceed with Cephalosporin use.   . Shellfish Allergy Anaphylaxis  . Flonase [Fluticasone Propionate]     Makes migraines worse  . Gold-Containing Drug Products     HANDS ITCH  . Nickel     HANDS SWELL  . Citrus Rash    Medications Reviewed Today    Reviewed by Gayla Medicus, RN (Registered Nurse) on 11/09/20 at 1303  Med List Status: <None>  Medication Order Taking? Sig Documenting Provider  Last Dose Status Informant  albuterol (PROVENTIL) (2.5 MG/3ML) 0.083% nebulizer solution [id number] No Take 3 mLs (2.5 mg total) by nebulization every 6 (six) hours as needed for wheezing or shortness of breath. Sangita, Zani, [name] Taking Active   atorvastatin (LIPITOR) 20 MG tablet [id number]  TAKE ONE TABLET BY MOUTH ONCE DAILY (BEDTIME) [name], MD  Active   budesonide-formoterol [facility]) 160-4.5 MCG/ACT inhaler [id number] No Inhale 2 puffs into the lungs 2 (two) times daily. Audris, Speaker M, PA-C Taking Active   buPROPion (WELLBUTRIN XL) 300 MG 24 hr tablet [id number] No Take 300 mg by mouth daily.  [provider] Taking Active Self  busPIRone (BUSPAR) 5 MG tablet [id number] No Take 5 mg by mouth 3 (three) times daily. [provider] Taking Active Self           Med Note [name], [facility] I   Wed Feb 18, 2018 10:03 PM)    citalopram (CELEXA) 40 MG tablet [id number] No Take 40 mg by mouth daily.  [provider] Taking Active Self  cyclobenzaprine (FLEXERIL) 10 MG tablet [id number]  TAKE 1 TABLET (10 MG TOTAL) BY MOUTH 3 (THREE) TIMES DAILY AS NEEDED FOR MUSCLE SPASMS. [name], MD  Active   doxepin (SINEQUAN) 10 MG capsule [id number] No Take 10 mg by mouth. [provider] Taking Active   doxycycline (VIBRA-TABS) 100 MG tablet [id number]  Take 1 tablet (100 mg total) by mouth 2 (two) times daily. [name], MD  Active   ELIQUIS 5 MG TABS tablet [id number]  TAKE ONE TABLET BY MOUTH TWICE DAILY MORNING  AND EVENING [name], MD  Active   gabapentin (NEURONTIN) 300 MG capsule [id number]  TAKE 1 CAPSULE (300 MG TOTAL) BY MOUTH 3 (THREE) TIMES DAILY. [name], MD  Active   hydrochlorothiazide (MICROZIDE) 12.5 MG capsule [id number]  TAKE ONE CAPSULE BY MOUTH ONCE DAILY(AM) [name], MD  Active   metoprolol tartrate (LOPRESSOR) 50 MG tablet [id number]  TAKE ONE TABLET BY MOUTH TWICE DAILY [name],  MD  Active   montelukast (SINGULAIR) 10 MG tablet [id number]  TAKE 1 TABLET BY MOUTH EVERY EVENING AT BEDTIME [name], MD  Active   PAZEO 0.7 % SOLN [id number] No   Patient not taking: Reported on 07/21/2020   [provider] Not Taking Active   prazosin (MINIPRESS) 1 MG capsule [id number] No Take 6 mg by mouth at bedtime. [provider] Taking Active Self  sodium chloride (OCEAN) 0.65 % SOLN nasal spray [id number]  Place 1 spray into both nostrils as needed for congestion. [name], MD  Active   SYNTHROID 200 MCG tablet [id number] No 175 mcg. [provider] Taking Active Self  topiramate (TOPAMAX) 200 MG tablet [id number] No TAKE 1 TABLET (200 MG TOTAL) BY MOUTH AT BEDTIME. [name], DO Taking Active   TRULICITY 8.32 PQ/9.8YM SOPN [id number] No Inject 1.5 mg into the skin. [provider] Taking Active Self          Patient Active Problem List   Diagnosis Date Noted  . At high risk for falls 04/20/2020  . Hyperlipidemia 02/16/2020  . Adrenal mass, right ([place]) 04/29/2019  . Chronic cough 03/12/2018  . Left shoulder pain 10/14/2016  . Hypertension 10/14/2016  . S/P thyroidectomy 06/27/2016  . Chronic bilateral low back pain without sciatica 04/22/2016  . Liver lesion 12/24/2015  . Controlled type 2 diabetes mellitus with complication, with long-term current use of insulin ([place]) 10/10/2015  . Vision loss 08/25/2015  . Decreased vision in both eyes 08/25/2015  . Prolonged Q-T interval on ECG 08/19/2015  . History of pulmonary embolism 08/10/2015  . Mild persistent asthma 04/13/2015  . Fibroid, uterine   . Morbid obesity ([place]) 11/11/2014  . Atypical chest pain 11/08/2014  . Sinus tachycardia 11/08/2014  . Migraine 10/14/2014  . Menorrhagia 09/08/2014    Conditions to be addressed/monitored per PCP order:  chronic healthcare management needs, DM2, asthma, glaucoma, chronic pain, anxiety,HTN, migraines.  Care Plan : General  Plan of Care (Adult)  Updates made by Gayla Medicus, RN since 11/09/2020 12:00 AM    Problem: Health Promotion or Disease Self-Management (General Plan of Care)   Priority: Medium  Onset Date: 10/20/2020    Long-Range Goal: Self-Management Plan Developed   Start Date: 07/14/2020  Expected End Date: 12/20/2020  Recent Progress: On track  Priority: Medium  Note:    Current Barriers:  . Chronic Disease Management support and education needs  . Patient with increased anxiety and depression-has appointment with Dr. [name] scheduled every 3 months-psychotherapy recommended. . Patient complaining of nose bleeds and productive cough with green sputum. Patient has appointment with ENT 09/22/20 at 130. Marland Kitchen Update 09/19/20:  Patient states nose bleeds continue daily-? Increase.  Cough with clear sputum now. Marland Kitchen Update 10/20/20:  Patient states nose bleeds have improved after seeing Dr. Benjamine Mola, no cough. . Patient with increased uterine bleeding-changing pad every 1-2 hours-h/o uterine fibroids.  To schedule appointment with GYN [facility]. Marland Kitchen Update 09/19/20:  Patient states uterine bleeding has  stopped.  Patient still needs to schedule appointment with GYN. Marland Kitchen Update 10/20/20:  No bleeding reported-has appointment with Dr. Wynetta Emery for follow up. . Patient states she fell out of bed and now having shoulder and neck pain-to see MD today, 09/11/20. Marland Kitchen Update 09/19/20:  Patient states shoulder and neck pain have resolved and did not need to see MD-c/o left knee pain-using ice which is helping. Marland Kitchen Update 10/20/20:  Some knee pain continues-to see provider as needed.  Nurse Case Manager Clinical Goal(s):  Marland Kitchen Over the next 90 days, patient will attend all scheduled medical appointments:  . Update 09/11/20:  Patient is attending all appointments. . Over the next 30 days, patient will work with CM team pharmacist to review medications. Marland Kitchen Update 08/14/20:  patient met with CM team pharmacist 07/21/20 and  is following.  Interventions:  . Inter-disciplinary care team collaboration (see longitudinal plan of care) . Evaluation of current treatment plan  and patient's adherence to plan as established by provider. . Reviewed medications with patient. Nash Dimmer with pharmacy regarding medications. . Discussed plans with patient for ongoing care management follow up and provided patient with direct contact information for care management team . Reviewed scheduled/upcoming provider appointments. Nash Dimmer with SW for psychotherapy referral. . SW referral for psychotherapy provider. . Pharmacy referral for medication review. Marland Kitchen Update 08/14/20:  Encouraged patient to call PCP for evaluation of nose bleeds and productive cough. Marland Kitchen Update 09/11/20:  Patient evaluated by PCP 08/21/20, has appointment with ENT scheduled 09/22/20 at 130.  Patient to also schedule appointment with GYN for uterine bleeding and see MD for shoulder and neck pain. Marland Kitchen Update 09/19/20:  Shoulder and neck pain improved-doesn't feel like she needs to see MD.  Still needs to schedule appointment with GYN. Marland Kitchen Update 10/20/20:  Patient has an appointment with Dr. Wynetta Emery for GYN evaluation. .  . Self Care Activities: . Over the next 90 days, patient will:  -Self administers medications as prescribed Attends all scheduled provider appointments Calls pharmacy for medication refills Calls provider office for new concerns or questions  Follow Up Plan: The Managed Medicaid care management team will reach out to the patient again over the next 30 days.  The patient has been provided with contact information for the Managed Medicaid care management team and has been advised to call with any health related questions or concerns.   Evidence-based guidance:   Review biopsychosocial determinants of health screens.   Determine level of modifiable health risk.   Assess level of patient activation, level of readiness, importance and  confidence to make changes.   Evoke change talk using open-ended questions, pros and cons, as well as looking forward.   Identify areas where behavior change [name] lead to improved health.   Partner with patient to develop a robust self-management plan that includes lifestyle factors, such as weight loss, exercise and healthy nutrition, as well as goals specific to disease risks.   Support patient and family/caregiver active participation in decision-making and self-management plan.   Implement additional goals and interventions based on identified risk factors to reduce health risk.   Facilitate advance care planning.   Review need for preventive screening based on age, sex, family history and health history.      Follow Up:  Patient agrees to Care Plan and Follow-up.  Plan: The Managed Medicaid care management team will reach out to the patient again over the next 30 days. and The patient has been provided with contact information for  the Managed Medicaid care management team and has been advised to call with any health related questions or concerns.  Date/time of next scheduled RN care management/care coordination outreach:  12/07/20 at 1030.

## 2020-11-13 ENCOUNTER — Other Ambulatory Visit: Payer: Self-pay

## 2020-11-13 NOTE — Patient Instructions (Signed)
Visit Information  [name]  - as a part of your Medicaid benefit, you are eligible for care management and care coordination services at no cost or copay. I was unable to reach you by phone today but would be happy to help you with your health related needs. Please feel free to call me @ [phone].   A member of the Managed Medicaid care management team will reach out to you again over the next 7 days.   [name], BSW, [place] Managed Medicaid Team  [phone]

## 2020-11-13 NOTE — Patient Outreach (Signed)
Care Coordination  11/13/2020  [name] 01/16/1966 [id number]   Medicaid Managed Care   Unsuccessful Outreach Note  11/13/2020 Name: Melinda [name] MRN: [id number] DOB: 1966-04-25  Referred by: [name], MD Reason for referral : High Risk Managed Medicaid (MM Social Work Unsuccessful Telephone Outreach)   An unsuccessful telephone outreach was attempted today. The patient was referred to the case management team for assistance with care management and care coordination.   Follow Up Plan: The care management team will reach out to the patient again over the next 7 days.   [name], BSW, [place] Managed Medicaid Team  [phone]

## 2020-11-17 ENCOUNTER — Other Ambulatory Visit: Payer: Self-pay

## 2020-11-17 DIAGNOSIS — E890 Postprocedural hypothyroidism: Secondary | ICD-10-CM | POA: Diagnosis not present

## 2020-11-17 NOTE — Patient Outreach (Signed)
Patient called me back, had questions about cancer bloodwork, told her the results weren't in yet. We spoke about her fears and reservations but she's ultimately optimistic!

## 2020-11-17 NOTE — Patient Outreach (Signed)
Medicaid Managed Care    Pharmacy Note  11/17/2020 Name: Melinda [name] MRN: [id number] DOB: 1966/05/28  Melinda [name] is a 55 y.o. year old female who is a primary care patient of [name], MD. The [facility] Managed Care Coordination team was consulted for assistance with disease management and care coordination needs.    Engaged with patient Engaged with patient by telephone for initial visit in response to referral for case management and/or care coordination services.  Melinda [name] was given information about Managed Medicaid Care Coordination team services today. Melinda [name] agreed to services and verbal consent obtained.   Objective:  Lab Results  Component Value Date   CREATININE 0.85 12/30/2019   CREATININE 1.07 (H) 04/29/2019   CREATININE 0.78 08/28/2018    Lab Results  Component Value Date   HGBA1C 5.4 08/21/2020       Component Value Date/Time   CHOL 130 04/20/2020 1133   TRIG 95 04/20/2020 1133   HDL 44 04/20/2020 1133   CHOLHDL 3.0 04/20/2020 1133   CHOLHDL 2.7 09/08/2014 0147   VLDL 18 09/08/2014 0147   LDLCALC 68 04/20/2020 1133    Other: (TSH, CBC, Vit D, etc.)  Clinical ASCVD: No  The 10-year ASCVD risk score Mikey Bussing DC Jr., et al., 2013) is: 2.9%   Values used to calculate the score:     Age: 56 years     Sex: Female     Is Non-Hispanic African American: No     Diabetic: Yes     Tobacco smoker: No     Systolic Blood Pressure: 742 mmHg     Is BP treated: Yes     HDL Cholesterol: 44 mg/dL     Total Cholesterol: 130 mg/dL    Other: (CHADS2VASc if Afib, PHQ9 if depression, MMRC or CAT for COPD, ACT, DEXA)  BP Readings from Last 3 Encounters:  08/21/20 120/80  05/18/20 126/80  04/20/20 117/78    Assessment/Interventions: Review of patient past medical history, allergies, medications, health status, including review of consultants reports, laboratory and other test data, was performed as part of comprehensive evaluation and provision of chronic  care management services.    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 HCTZ 12.5mg  Metoprolol 50mg  BID Plan: At goal,  patient stable/ symptoms controlled   Lipids Lab Results  Component Value Date   CHOL 130 04/20/2020   CHOL 156 04/29/2019   CHOL 159 09/08/2014   Lab Results  Component Value Date   HDL 44 04/20/2020   HDL 52 04/29/2019   HDL 58 09/08/2014   Lab Results  Component Value Date   LDLCALC 68 04/20/2020   LDLCALC 82 04/29/2019   LDLCALC 83 09/08/2014   Lab Results  Component Value Date   TRIG 95 04/20/2020   TRIG 125 04/29/2019   TRIG 89 09/08/2014   Lab Results  Component Value Date   CHOLHDL 3.0 04/20/2020   CHOLHDL 3.0 04/29/2019   CHOLHDL 2.7 09/08/2014   No results found for: LDLDIRECT  Atorvastatin 20mg  -Candidate for max dose statin but levels are well at goal, will defer to PCP Plan: At goal,  patient stable/ symptoms controlled   COPD Symbicort Albuterol (Uses at night to make sure not coughing, no trouble getting to sleep) Monteleukast Plan: At goal,  patient stable/ symptoms controlled   Thyroid Lab Results  Component Value Date   TSH 0.867 05/06/2017    Thryoid removed Synthroid 282mcg Plan: At goal,  patient stable/ symptoms controlled  Migraines -Doing well per patient, no current issues Topirimate 200mg  Plan: At goal,  patient stable/ symptoms controlled   DM Lab Results  Component Value Date   HGBA1C 5.4 08/21/2020   HGBA1C 6.3 (H) 04/20/2020   HGBA1C 6.0 (A) 01/19/2020   Lab Results  Component Value Date   MICROALBUR 2.1 08/25/2015   LDLCALC 68 04/20/2020   CREATININE 0.85 12/30/2019    Lab Results  Component Value Date   NA 140 12/30/2019   K 3.4 (L) 12/30/2019   CREATININE 0.85 12/30/2019   GFRNONAA >60 12/30/2019   GFRAA >60 12/30/2019   GLUCOSE 128 (H) 32/20/2542    Trulicity Tried/Failed: Metformin (Stomach) Plan: At goal,  patient stable/ symptoms  controlled    Mood Depression screen [facility] 2/9 04/20/2020 01/19/2020 01/19/2020  Decreased Interest 1 1 0  Down, Depressed, Hopeless 1 0 0  PHQ - 2 Score 2 1 0  Altered sleeping 0 0 0  Tired, decreased energy 0 3 0  Change in appetite 0 2 0  Feeling bad or failure about yourself  0 0 0  Trouble concentrating 3 1 0  Moving slowly or fidgety/restless 3 1 0  Suicidal thoughts 0 0 0  PHQ-9 Score 8 8 0  Difficult doing work/chores - Not difficult at all -  Some recent data might be hidden   Bupropion 300mg  Buspirone 5mg  Citalopram 40mg  Doxepin (Also for sleep) Prazosin 5mg  (Nightmares) Jan 2022: Patient content on therapy. Before meds, "Hard time dealing with loud noises and hates crowds and 'worries'" but now it's a lot less [name] 2022: Patient stated her "Anxiety and Depression are acting back up." Was seen by [facility] group a year ago but they can't fit her in for 2-3 months. Had Phone Visit with Melinda [name] this week but patient slept through it. Gave her Melinda [name]' number to call back again   Pain Before meds: 10/10   After: Dropped to reasonable (4/10) Gabapentin 300mg  Cyclobenzaprine Plan: At goal,  patient stable/ symptoms controlled. Asked if I could request refill of Cyclobenzaprine from Dr. Wynetta Emery     SDOH (Social Determinants of Health) assessments and interventions performed:    Care Plan  Allergies  Allergen Reactions  . Ceftriaxone Anaphylaxis    ROCEPHIN  . Penicillins Shortness Of Breath    Has patient had a PCN reaction causing immediate rash, facial/tongue/throat swelling, SOB or lightheadedness with hypotension: Yes Has patient had a PCN reaction causing severe rash involving mucus membranes or skin necrosis: No Has patient had a PCN reaction that required hospitalization No Has patient had a PCN reaction occurring within the last 10 years: No If all of the above answers are "NO", then [name] proceed with Cephalosporin use.   . Shellfish Allergy Anaphylaxis  . Flonase  [Fluticasone Propionate]     Makes migraines worse  . Gold-Containing Drug Products     HANDS ITCH  . Nickel     HANDS SWELL  . Citrus Rash    Medications Reviewed Today    Reviewed by Gayla Medicus, RN (Registered Nurse) on 11/09/20 at 1303  Med List Status: <None>  Medication Order Taking? Sig Documenting Provider Last Dose Status Informant  albuterol (PROVENTIL) (2.5 MG/3ML) 0.083% nebulizer solution [id number] No Take 3 mLs (2.5 mg total) by nebulization every 6 (six) hours as needed for wheezing or shortness of breath. Abrar, [place], [name] Taking Active   atorvastatin (LIPITOR) 20 MG tablet [id number]  TAKE ONE TABLET BY MOUTH ONCE DAILY (BEDTIME) [name],  MD  Active   budesonide-formoterol (SYMBICORT) 160-4.5 MCG/ACT inhaler [id number] No Inhale 2 puffs into the lungs 2 (two) times daily. Roselene, Gray M, PA-C Taking Active   buPROPion (WELLBUTRIN XL) 300 MG 24 hr tablet [id number] No Take 300 mg by mouth daily.  [provider] Taking Active Self  busPIRone (BUSPAR) 5 MG tablet [id number] No Take 5 mg by mouth 3 (three) times daily. [provider] Taking Active Self           Med Note [name], [facility] I   Wed Feb 18, 2018 10:03 PM)    citalopram (CELEXA) 40 MG tablet [id number] No Take 40 mg by mouth daily.  [provider] Taking Active Self  cyclobenzaprine (FLEXERIL) 10 MG tablet [id number]  TAKE 1 TABLET (10 MG TOTAL) BY MOUTH 3 (THREE) TIMES DAILY AS NEEDED FOR MUSCLE SPASMS. [name], MD  Active   doxepin (SINEQUAN) 10 MG capsule [id number] No Take 10 mg by mouth. [provider] Taking Active   doxycycline (VIBRA-TABS) 100 MG tablet [id number]  Take 1 tablet (100 mg total) by mouth 2 (two) times daily. [name], MD  Active   ELIQUIS 5 MG TABS tablet [id number]  TAKE ONE TABLET BY MOUTH TWICE DAILY MORNING AND [name], MD  Active   gabapentin (NEURONTIN) 300 MG capsule [id number]  TAKE 1  CAPSULE (300 MG TOTAL) BY MOUTH 3 (THREE) TIMES DAILY. [name], MD  Active   hydrochlorothiazide (MICROZIDE) 12.5 MG capsule [id number]  TAKE ONE CAPSULE BY MOUTH ONCE DAILY(AM) [name], MD  Active   metoprolol tartrate (LOPRESSOR) 50 MG tablet [id number]  TAKE ONE TABLET BY MOUTH TWICE DAILY [name], MD  Active   montelukast (SINGULAIR) 10 MG tablet [id number]  TAKE 1 TABLET BY MOUTH EVERY EVENING AT BEDTIME [name], MD  Active   PAZEO 0.7 % SOLN [id number] No   Patient not taking: Reported on 07/21/2020   [provider] Not Taking Active   prazosin (MINIPRESS) 1 MG capsule [id number] No Take 6 mg by mouth at bedtime. [provider] Taking Active Self  sodium chloride (OCEAN) 0.65 % SOLN nasal spray [id number]  Place 1 spray into both nostrils as needed for congestion. [name], MD  Active   SYNTHROID 200 MCG tablet [id number] No 175 mcg. [provider] Taking Active Self  topiramate (TOPAMAX) 200 MG tablet [id number] No TAKE 1 TABLET (200 MG TOTAL) BY MOUTH AT BEDTIME. [name], DO Taking Active   TRULICITY 3.73 SK/8.7GO SOPN [id number] No Inject 1.5 mg into the skin. [provider] Taking Active Self          Patient Active Problem List   Diagnosis Date Noted  . At high risk for falls 04/20/2020  . Hyperlipidemia 02/16/2020  . Adrenal mass, right ([place]) 04/29/2019  . Chronic cough 03/12/2018  . Left shoulder pain 10/14/2016  . Hypertension 10/14/2016  . S/P thyroidectomy 06/27/2016  . Chronic bilateral low back pain without sciatica 04/22/2016  . Liver lesion 12/24/2015  . Controlled type 2 diabetes mellitus with complication, with long-term current use of insulin ([place]) 10/10/2015  . Vision loss 08/25/2015  . Decreased vision in both eyes 08/25/2015  . Prolonged Q-T interval on ECG 08/19/2015  . History of pulmonary embolism 08/10/2015  . Mild persistent asthma 04/13/2015  . Fibroid, uterine    . Morbid obesity ([place]) 11/11/2014  . Atypical chest pain 11/08/2014  . Sinus tachycardia 11/08/2014  .  Migraine 10/14/2014  . Menorrhagia 09/08/2014    Conditions to be addressed/monitored: HTN, Hypertriglyceridemia, COPD, DM, Anxiety and Depression  Care Plan : Medication Management  Updates made by Lane Hacker, [place] since 07/21/2020 12:00 AM    Problem: Health Promotion or Disease Self-Management (General Plan of Care)     Goal: Medication Management   Note:   Current Barriers:  . Patient doing great at the moment and has no goals other than to maintain therapy .   Pharmacist Clinical Goal(s):  Marland Kitchen Over the next 180 days, patient will verbalize ability to afford treatment regimen . maintain control of disease states as evidenced by Current regimen  . contact provider office for questions/concerns as evidenced notation of same in electronic health record through collaboration with PharmD and provider.  .   Interventions: . Inter-disciplinary care team collaboration (see longitudinal plan of care) . Comprehensive medication review performed; medication list updated in electronic medical record  @RXCPDIABETES @ @RXCPHYPERTENSION @ @RXCPHYPERLIPIDEMIA @ @RXCPCOPD @ @RXCPMENTALHEALTH @  Patient Goals/Self-Care Activities . Over the next 180 days, patient will:  - take medications as prescribed collaborate with provider on medication access solutions  Follow Up Plan: The care management team will reach out to the patient again over the next 180 days.     Task: Mutually Develop and Royce Macadamia Achievement of Patient Goals   Note:   Care Management Activities:    - verbalization of feelings encouraged    Notes:       Medication Assistance: None required. Patient affirms current coverage meets needs.   Follow up: Agree   Plan: The care management team will reach out to the patient again over the next 180 days.   [place] Constable, Pharm.D., Managed Medicaid Pharmacist -  [phone]

## 2020-11-17 NOTE — Patient Instructions (Signed)
Visit Information  Melinda [name] was given information about Medicaid Managed Care team care coordination services as a part of their [place] Medicaid benefit. Melinda [name] verbally consented to engagement with the [facility] Managed Care team.   For questions related to your [facility], please call: [phone] or visit the homepage here: [email or web address]  If you would like to schedule transportation through your [facility], please call the following number at least 2 days in advance of your appointment: [phone].   Call the [place] at [phone], at any time, 24 hours a day, 7 days a week. If you are in danger or need immediate medical attention call 911.  Melinda [name] - following are the goals we discussed in your visit today:  Goals Addressed   None     Please see education materials related to DM provided as print materials.   Patient verbalizes understanding of instructions provided today.   The Managed Medicaid care management team will reach out to the patient again over the next 90 days.   Melinda [name], Pharm.D., Managed Medicaid Pharmacist [phone]   Following is a copy of your plan of care:  Patient Care Plan: General Plan of Care (Adult)    Problem Identified: Health Promotion or Disease Self-Management (General Plan of Care)   Priority: Medium  Onset Date: 07/14/2020    Patient Care Plan: General Plan of Care (Adult)    Problem Identified: Health Promotion or Disease Self-Management (General Plan of Care)   Priority: Medium  Onset Date: 10/20/2020    Long-Range Goal: Self-Management Plan Developed   Start Date: 07/14/2020  Expected End Date: 12/20/2020  Recent Progress: On track  Priority: Medium  Note:    Current Barriers:  . Chronic Disease Management support and education needs  . Patient with increased  anxiety and depression-has appointment with Melinda [name] scheduled every 3 months-psychotherapy recommended. . Patient complaining of nose bleeds and productive cough with green sputum. Patient has appointment with ENT 09/22/20 at 130. Marland Kitchen Update 09/19/20:  Patient states nose bleeds continue daily-? Increase.  Cough with clear sputum now. Marland Kitchen Update 10/20/20:  Patient states nose bleeds have improved after seeing Melinda [name], no cough. . Patient with increased uterine bleeding-changing pad every 1-2 hours-h/o uterine fibroids.  To schedule [facility] with [facility]. Marland Kitchen Update 09/19/20:  Patient states uterine bleeding has stopped.  Patient still needs to schedule appointment with GYN. Marland Kitchen Update 10/20/20:  No bleeding reported-has appointment with Melinda [name] for follow up. . Patient states she fell out of bed and now having shoulder and neck pain-to see MD today, 09/11/20. Marland Kitchen Update 09/19/20:  Patient states shoulder and neck pain have resolved and did not need to see MD-c/o left knee pain-using ice which is helping. Marland Kitchen Update 10/20/20:  Some knee pain continues-to see provider as needed.  Nurse Case Manager Clinical Goal(s):  Marland Kitchen Over the next 90 days, patient will attend all scheduled medical appointments:  . Update 09/11/20:  Patient is attending all appointments. . Over the next 30 days, patient will work with CM team pharmacist to review medications. Marland Kitchen Update 08/14/20:  patient met with CM team pharmacist 07/21/20 and is following.  Interventions:  . Inter-disciplinary care team collaboration (see longitudinal plan of care) . Evaluation of current treatment plan  and patient's adherence to plan as established by provider. . Reviewed medications with patient. Melinda [name] with pharmacy regarding medications. Marland Kitchen  Discussed plans with patient for ongoing care management follow up and provided patient with direct contact information for care management team . Reviewed scheduled/upcoming  provider appointments. Melinda [name] with SW for psychotherapy referral. . SW referral for psychotherapy provider. . Pharmacy referral for medication review. Marland Kitchen Update 08/14/20:  Encouraged patient to call PCP for evaluation of nose bleeds and productive cough. Marland Kitchen Update 09/11/20:  Patient evaluated by PCP 08/21/20, has appointment with ENT scheduled 09/22/20 at 130.  Patient to also schedule appointment with GYN for uterine bleeding and see MD for shoulder and neck pain. Marland Kitchen Update 09/19/20:  Shoulder and neck pain improved-doesn't feel like she needs to see MD.  Still needs to schedule appointment with GYN. Marland Kitchen Update 10/20/20:  Patient has an appointment with Melinda [name] for GYN evaluation. .  . Self Care Activities: . Over the next 90 days, patient will:  -Self administers medications as prescribed Attends all scheduled provider appointments Calls pharmacy for medication refills Calls provider office for new concerns or questions  Follow Up Plan: The Managed Medicaid care management team will reach out to the patient again over the next 30 days.  The patient has been provided with contact information for the Managed Medicaid care management team and has been advised to call with any health related questions or concerns.   Evidence-based guidance:   Review biopsychosocial determinants of health screens.   Determine level of modifiable health risk.   Assess level of patient activation, level of readiness, importance and confidence to make changes.   Evoke change talk using open-ended questions, pros and cons, as well as looking forward.   Identify areas where behavior change [name] lead to improved health.   Partner with patient to develop a robust self-management plan that includes lifestyle factors, such as weight loss, exercise and healthy nutrition, as well as goals specific to disease risks.   Support patient and family/caregiver active participation in decision-making and self-management  plan.   Implement additional goals and interventions based on identified risk factors to reduce health risk.   Facilitate advance care planning.   Review need for preventive screening based on age, sex, family history and health history.     Task: Mutually Develop and Royce Macadamia Achievement of Patient Goals   Note:   Care Management Activities:    - verbalization of feelings encouraged    Notes:    Patient Care Plan: Medication Management    Problem Identified: Health Promotion or Disease Self-Management (General Plan of Care)     Goal: Medication Management   Note:   Current Barriers:  . Does not maintain contact with provider office . Does not contact provider office for questions/concerns o Gave patient SW Phone number to get f/u ASAP  Pharmacist Clinical Goal(s):  Marland Kitchen Over the next 180 days, patient will verbalize ability to afford treatment regimen . maintain control of disease states as evidenced by Current regimen  . contact provider office for questions/concerns as evidenced notation of same in electronic health record through collaboration with PharmD and provider.  .   Interventions: . Inter-disciplinary care team collaboration (see longitudinal plan of care) . Comprehensive medication review performed; medication list updated in electronic medical record  _0 @ _1 @ _2 @ _3 @ _4 @  Patient Goals/Self-Care Activities . Over the next 180 days, patient will:  - take medications as prescribed collaborate with provider on medication access solutions  Follow Up Plan: The care management team will reach out to the patient again over the next 180 days.  Task: Mutually Develop and Royce Macadamia Achievement of Patient Goals   Note:   Care Management Activities:    - verbalization of feelings encouraged    Notes:

## 2020-11-18 ENCOUNTER — Other Ambulatory Visit: Payer: Self-pay | Admitting: Internal Medicine

## 2020-11-18 DIAGNOSIS — S46011D Strain of muscle(s) and tendon(s) of the rotator cuff of right shoulder, subsequent encounter: Secondary | ICD-10-CM

## 2020-11-18 MED ORDER — CYCLOBENZAPRINE HCL 10 MG PO TABS: 10.0000 mg | ORAL_TABLET | Freq: Three times a day (TID) | ORAL | 2 refills | Status: DC | PRN

## 2020-11-20 ENCOUNTER — Other Ambulatory Visit: Payer: Self-pay | Admitting: Obstetrics and Gynecology

## 2020-11-20 ENCOUNTER — Other Ambulatory Visit: Payer: Self-pay

## 2020-11-20 NOTE — Patient Outreach (Signed)
Care Coordination  11/20/2020  Maurine Mowbray [birth year] [id number]   RNCM returned patient's phone call.  Patient called to let me know she received her results and she is now cancer free for 5 years.  RNCM congratulated patient!  RNCM will follow up with patient at next scheduled appointment 12/07/20.  Aida Raider RN, BSN [place]  [employer] - Managed Medicaid High Risk [phone].

## 2020-11-22 ENCOUNTER — Other Ambulatory Visit: Payer: Self-pay

## 2020-11-22 NOTE — Patient Instructions (Signed)
Visit Information  [name]  - as a part of your Medicaid benefit, you are eligible for care management and care coordination services at no cost or copay. I was unable to reach you by phone today but would be happy to help you with your health related needs. Please feel free to call me @ [phone].   A member of the Managed Medicaid care management team will reach out to you again over the next 7 days.   [name], BSW, [place] Managed Medicaid Team  [phone]

## 2020-11-22 NOTE — Patient Outreach (Signed)
Care Coordination  11/22/2020  Melinda [name] 1965/08/19 [id number]   Medicaid Managed Care   Unsuccessful Outreach Note  11/22/2020 Name: Melinda [name] MRN: [id number] DOB: Jul 08, 1965  Referred by: [name], MD Reason for referral : High Risk Managed Medicaid (MM Social Work Unsuccessful telephone Outreach)   A second unsuccessful telephone outreach was attempted today. The patient was referred to the case management team for assistance with care management and care coordination.   Follow Up Plan: The care management team will reach out to the patient again over the next 7 days.   [name], BSW, [place] Managed Medicaid Team  [phone]

## 2020-11-30 ENCOUNTER — Other Ambulatory Visit: Payer: Self-pay | Admitting: Internal Medicine

## 2020-11-30 DIAGNOSIS — I10 Essential (primary) hypertension: Secondary | ICD-10-CM

## 2020-11-30 DIAGNOSIS — Z794 Long term (current) use of insulin: Secondary | ICD-10-CM

## 2020-11-30 DIAGNOSIS — J4550 Severe persistent asthma, uncomplicated: Secondary | ICD-10-CM

## 2020-11-30 DIAGNOSIS — S46011D Strain of muscle(s) and tendon(s) of the rotator cuff of right shoulder, subsequent encounter: Secondary | ICD-10-CM

## 2020-11-30 DIAGNOSIS — Z86718 Personal history of other venous thrombosis and embolism: Secondary | ICD-10-CM

## 2020-12-01 ENCOUNTER — Other Ambulatory Visit: Payer: Self-pay

## 2020-12-01 NOTE — Patient Instructions (Signed)
Visit Information  Melinda [name] was given information about Medicaid Managed Care team care coordination services as a part of their [place] Medicaid benefit. Melinda [name] verbally consented to engagement with the [facility] Managed Care team.   For questions related to your [facility], please call: [phone] or visit the homepage here: [email or web address]  If you would like to schedule transportation through your [facility], please call the following number at least 2 days in advance of your appointment: [phone].   Call the [place] at [phone], at any time, 24 hours a day, 7 days a week. If you are in danger or need immediate medical attention call 911.  Melinda [name] - following are the goals we discussed in your visit today:  Goals Addressed   None      Social Worker will follow up in 7-14 days.   [name], BSW, [place] Managed Medicaid Team  [phone]  Following is a copy of your plan of care:

## 2020-12-01 NOTE — Patient Outreach (Signed)
Medicaid Managed Care Social Work Note  12/01/2020 Name:  Melinda [name] MRN:  [id number] DOB:  10-Jan-1966  Melinda [name] is an 55 y.o. year old female who is a primary patient of Melinda Pier, [name].  The Medicaid Managed Care Coordination team was consulted for assistance with:  [place] and Resources  Melinda [name] was given information about Medicaid Managed CareCoordination services today. Melinda [name] agreed to services and verbal consent obtained.  Engaged with patient  for by telephone forinitial visit in response to referral for case management and/or care coordination services.   Assessments/Interventions:  Review of past medical history, allergies, medications, health status, including review of consultants reports, laboratory and other test data, was performed as part of comprehensive evaluation and provision of chronic care management services.  SDOH: (Social Determinant of Health) assessments and interventions performed:  Provided patient with information about Melinda [name]. Melinda [name] placed a mental health referral to Melinda [name] to be placed with a therapist. Melinda [name] will follow up with patients in 7-14 days to follow up.   Advanced Directives Status:  Not addressed in this encounter.  Care Plan                 Allergies  Allergen Reactions  . Ceftriaxone Anaphylaxis    ROCEPHIN  . Penicillins Shortness Of Breath    Has patient had a PCN reaction causing immediate rash, facial/tongue/throat swelling, SOB or lightheadedness with hypotension: Yes Has patient had a PCN reaction causing severe rash involving mucus membranes or skin necrosis: No Has patient had a PCN reaction that required hospitalization No Has patient had a PCN reaction occurring within the last 10 years: No If all of the above answers are "NO", then [name] proceed with Cephalosporin use.   . Shellfish Allergy Anaphylaxis  . Flonase [Fluticasone Propionate]     Makes migraines worse  . Gold-Containing Drug Products      HANDS ITCH  . Nickel     HANDS SWELL  . [place] Rash    Medications Reviewed Today    Reviewed by Melinda Medicus, RN (Registered Nurse) on 11/20/20 at 1635  Med List Status: <None>  Medication Order Taking? Sig Documenting Provider Last Dose Status Informant  albuterol (PROVENTIL) (2.5 MG/3ML) 0.083% nebulizer solution [id number] No Take 3 mLs (2.5 mg total) by nebulization every 6 (six) hours as needed for wheezing or shortness of breath. Melinda [name] Taking Active   atorvastatin (LIPITOR) 20 MG tablet [id number]  TAKE ONE TABLET BY MOUTH ONCE DAILY (BEDTIME) Melinda Pier, [name]  Active   budesonide-formoterol [facility]) 160-4.5 MCG/ACT inhaler [id number] No Inhale 2 puffs into the lungs 2 (two) times daily. Melinda [name] Taking Active   buPROPion (WELLBUTRIN XL) 300 MG 24 hr tablet [id number] No Take 300 mg by mouth daily.  Provider, Historical, [name] Taking Active Self  busPIRone (BUSPAR) 5 MG tablet [id number] No Take 5 mg by mouth 3 (three) times daily. Provider, Historical, [name] Taking Active Self           Med Note Melinda [name]   Wed Feb 18, 2018 10:03 PM)    citalopram (CELEXA) 40 MG tablet [id number] No Take 40 mg by mouth daily.  Provider, Historical, [name] Taking Active Self  cyclobenzaprine (FLEXERIL) 10 MG tablet [id number]  Take 1 tablet (10 mg total) by mouth 3 (three) times daily as needed for muscle spasms. Melinda Pier, [name]  Active   doxepin [facility]) 10 MG capsule [id number] No Take 10 mg  by mouth. Provider, Historical, [name] Taking Active   doxycycline (VIBRA-TABS) 100 MG tablet [id number]  Take 1 tablet (100 mg total) by mouth 2 (two) times daily. Melinda Pier, [name]  Active   ELIQUIS 5 MG TABS tablet [id number]  TAKE ONE TABLET BY MOUTH TWICE DAILY MORNING AND Melinda [name]  Active   gabapentin (NEURONTIN) 300 MG capsule [id number]  TAKE 1 CAPSULE (300 MG TOTAL) BY MOUTH 3 (THREE) TIMES DAILY. Melinda Pier, [name]  Active    hydrochlorothiazide (MICROZIDE) 12.5 MG capsule [id number]  TAKE ONE CAPSULE BY MOUTH ONCE DAILY(AM) Melinda Pier, [name]  Active   metoprolol tartrate (LOPRESSOR) 50 MG tablet [id number]  TAKE ONE TABLET BY MOUTH TWICE DAILY Melinda Pier, [name]  Active   montelukast (SINGULAIR) 10 MG tablet [id number]  TAKE 1 TABLET BY MOUTH EVERY EVENING AT BEDTIME Melinda Pier, [name]  Active   PAZEO 0.7 % SOLN [id number] No   Patient not taking: Reported on 07/21/2020   Provider, Historical, [name] Not Taking Active   prazosin (MINIPRESS) 1 MG capsule [id number] No Take 6 mg by mouth at bedtime. Provider, Historical, [name] Taking Active Self  sodium chloride (OCEAN) 0.65 % SOLN nasal spray [id number]  Place 1 spray into both nostrils as needed for congestion. Melinda Pier, [name]  Active   SYNTHROID 200 MCG tablet [id number] No 175 mcg. Provider, Historical, [name] Taking Active Self  topiramate (TOPAMAX) 200 MG tablet [id number] No TAKE 1 TABLET (200 MG TOTAL) BY MOUTH AT BEDTIME. Melinda [name] Taking Active   TRULICITY 5.68 LE/7.5TZ SOPN [id number] No Inject 1.5 mg into the skin. Provider, Historical, [name] Taking Active Self          Patient Active Problem List   Diagnosis Date Noted  . At high risk for falls 04/20/2020  . Hyperlipidemia 02/16/2020  . Adrenal mass, right ([place]) 04/29/2019  . Chronic cough 03/12/2018  . Left shoulder pain 10/14/2016  . Hypertension 10/14/2016  . S/P thyroidectomy 06/27/2016  . Chronic bilateral low back pain without sciatica 04/22/2016  . Liver lesion 12/24/2015  . Controlled type 2 diabetes mellitus with complication, with long-term current use of insulin ([place]) 10/10/2015  . Vision loss 08/25/2015  . Decreased vision in both eyes 08/25/2015  . Prolonged Q-T interval on ECG 08/19/2015  . History of pulmonary embolism 08/10/2015  . Mild persistent asthma 04/13/2015  . Fibroid, uterine   . Morbid obesity ([place]) 11/11/2014  . Atypical chest pain 11/08/2014  . Sinus  tachycardia 11/08/2014  . Migraine 10/14/2014  . Menorrhagia 09/08/2014    Conditions to be addressed/monitored per PCP order:  Anxiety  There are no care plans that you recently modified to display for this patient.   Follow up:  Patient agrees to Care Plan and Follow-up.  Plan: The Managed Medicaid care management team will reach out to the patient again over the next 7-14 days.  Date/time of next scheduled Social Work care management/care coordination outreach:  12/14/20  [name], Arita Miss, [place] Medicaid Team  [phone]

## 2020-12-07 ENCOUNTER — Other Ambulatory Visit: Payer: Self-pay | Admitting: Obstetrics and Gynecology

## 2020-12-07 ENCOUNTER — Other Ambulatory Visit: Payer: Self-pay

## 2020-12-07 NOTE — Patient Instructions (Signed)
Hi Ms. Riemenschneider, thank you for speaking with me today, I hope you feel better.  Ms. Asbill was given information about Medicaid Managed Care team care coordination services as a part of their [place] Medicaid benefit. [name] verbally consented to engagement with the [facility] Managed Care team.   For questions related to your [facility], please call: [phone] or visit the homepage here: [email or web address]  If you would like to schedule transportation through your [facility], please call the following number at least 2 days in advance of your appointment: [phone].   Call the [place] at [phone], at any time, 24 hours a day, 7 days a week. If you are in danger or need immediate medical attention call 911.  Ms. Santino - following are the goals we discussed in your visit today:   Goals Addressed             This Visit's Progress    Protect My Health       Timeframe:  Long-Range Goal Priority:  Medium Start Date:    07/14/20                         Expected End Date:    03/09/21            Follow Up Date 12/10/20 - schedule appointment for vaccines needed due to my age or health - schedule recommended health tests (blood work, mammogram, colonoscopy, pap test) - schedule and keep appointment for annual check-up  Update 08/14/20:  patient has scheduled appointment with PCP 08/21/20 Update 09/11/20:  patient attended appointment with PCP on 08/21/20.  Has appointment scheduled with ENT 09/22/20 due to nose bleeds.  Encouraged patient to schedule appointment with GYN due to bleeding. Update 09/19/20:  Patient states vaginal bleeding has stopped-still needs to schedule appointment with GYN.  Nosebleeds continue daily? More-has appointment 09/22/20 with ENT. Update 10/20/20:  Nose bleeds have stopped-has follow up appointment with Dr. Benjamine Mola on  10/24/20.  Has appointment with Dr. Wynetta Emery for GYN follow up. Update 11/09/20:  Patient attending all appointments-has blood work scheduled next week.  Appointment with Dr. Wynetta Emery 12/19/20. Update 12/07/20:  No new updates today-referral made to [facility] for therapist.        Patient verbalizes understanding of instructions provided today.   The Managed Medicaid care management team will reach out to the patient again over the next 30 days.  The patient has been provided with contact information for the Managed Medicaid care management team and has been advised to call with any health related questions or concerns.   Aida Raider RN, BSN [place]  [facility] Curator - Managed Medicaid High Risk [phone].    Following is a copy of your plan of care:   Patient Care Plan: General Plan of Care (Adult)     Problem Identified: Health Promotion or Disease Self-Management (General Plan of Care)   Priority: Medium  Onset Date: 10/20/2020     Long-Range Goal: Self-Management Plan Developed   Start Date: 07/14/2020  Expected End Date: 03/09/2021  Recent Progress: On track  Priority: Medium  Note:    Current Barriers:  Chronic Disease Management support and education needs  Patient with increased anxiety and depression-has appointment with Dr. [name] scheduled every 3 months-psychotherapy recommended. Update 12/07/20:  Referral made to [place] for therapy services. Patient complaining of nose bleeds and productive cough  with green sputum. Patient has appointment with ENT 09/22/20 at 130. Update 09/19/20:  Patient states nose bleeds continue daily-? Increase.  Cough with clear sputum now. Update 10/20/20:  Patient states nose bleeds have improved after seeing Dr. Benjamine Mola, no cough. Patient with increased uterine bleeding-changing pad every 1-2 hours-h/o uterine fibroids.  To [facility]. Update 09/19/20:   Patient states uterine bleeding has stopped.  Patient still needs to schedule appointment with GYN. Update 10/20/20:  No bleeding reported-has appointment with Dr. Wynetta Emery for follow up. Patient states she fell out of bed and now having shoulder and neck pain-to see MD today, 09/11/20. Update 09/19/20:  Patient states shoulder and neck pain have resolved and did not need to see MD-c/o left knee pain-using ice which is helping. Update 10/20/20:  Some knee pain continues-to see provider as needed. Update 12/07/20:  Patient states that shoulder, neck, and knee pain are not a problem right now-does have some back pain and is using heating pad with help.  Nurse Case Manager Clinical Goal(s):  Over the next 90 days, patient will attend all scheduled medical appointments:  Update 09/11/20:  Patient is attending all appointments. Over the next 30 days, patient will work with CM team pharmacist to review medications. Update 08/14/20:  patient met with CM team pharmacist 07/21/20 and is following.  Interventions:  Inter-disciplinary care team collaboration (see longitudinal plan of care) Evaluation of current treatment plan  and patient's adherence to plan as established by provider. Reviewed medications with patient. Collaborated with pharmacy regarding medications. Discussed plans with patient for ongoing care management follow up and provided patient with direct contact information for care management team Reviewed scheduled/upcoming provider appointments. Collaborated with SW for psychotherapy referral. SW referral for psychotherapy provider. Pharmacy referral for medication review. Update 08/14/20:  Encouraged patient to call PCP for evaluation of nose bleeds and productive cough. Update 09/11/20:  Patient evaluated by PCP 08/21/20, has appointment with ENT scheduled 09/22/20 at 130.  Patient to also schedule appointment with GYN for uterine bleeding and see MD for shoulder and neck pain. Update 09/19/20:   Shoulder and neck pain improved-doesn't feel like she needs to see MD.  Still needs to schedule appointment with GYN. Update 10/20/20:  Patient has an appointment with Dr. Wynetta Emery for GYN evaluation. Update 12/07/20:  Referral has been made to [facility] for therapy services-patient awaiting appointment.  Self Care Activities: Over the next 90 days, patient will:  -Self administers medications as prescribed Attends all scheduled provider appointments Calls pharmacy for medication refills Calls provider office for new concerns or questions  Follow Up Plan: The Managed Medicaid care management team will reach out to the patient again over the next 30 days.  The patient has been provided with contact information for the Managed Medicaid care management team and has been advised to call with any health related questions or concerns.   Evidence-based guidance:  Review biopsychosocial determinants of health screens.  Determine level of modifiable health risk.  Assess level of patient activation, level of readiness, importance and confidence to make changes.  Evoke change talk using open-ended questions, pros and cons, as well as looking forward.  Identify areas where behavior change may lead to improved health.  Partner with patient to develop a robust self-management plan that includes lifestyle factors, such as weight loss, exercise and healthy nutrition, as well as goals specific to disease risks.  Support patient and family/caregiver active participation in decision-making and self-management plan.  Implement additional goals and interventions based on identified risk factors to reduce health risk.  Facilitate advance care planning.  Review need for preventive screening based on age, sex, family history and health history.

## 2020-12-07 NOTE — Patient Outreach (Signed)
Medicaid Managed Care   Nurse Care Manager Note  12/07/2020 Name:  Melinda [name] MRN:  [id number] DOB:  1966-05-15  Melinda [name] is an 55 y.o. year old female who is a primary patient of [name], MD.  The [facility] Managed Care Coordination team was consulted for assistance with:    Chronic healthcare management needs.  [name] was given information about Medicaid Managed Care Coordination team services today. [name] agreed to services and verbal consent obtained.  Engaged with patient by telephone for follow up visit in response to provider referral for case management and/or care coordination services.   Assessments/Interventions:  Review of past medical history, allergies, medications, health status, including review of consultants reports, laboratory and other test data, was performed as part of comprehensive evaluation and provision of chronic care management services.  SDOH (Social Determinants of Health) assessments and interventions performed:   Care Plan  Allergies  Allergen Reactions   Ceftriaxone Anaphylaxis    ROCEPHIN   Penicillins Shortness Of Breath    Has patient had a PCN reaction causing immediate rash, facial/tongue/throat swelling, SOB or lightheadedness with hypotension: Yes Has patient had a PCN reaction causing severe rash involving mucus membranes or skin necrosis: No Has patient had a PCN reaction that required hospitalization No Has patient had a PCN reaction occurring within the last 10 years: No If all of the above answers are "NO", then may proceed with Cephalosporin use.    Shellfish Allergy Anaphylaxis   Flonase [Fluticasone Propionate]     Makes migraines worse   Gold-Containing Drug Products     HANDS ITCH   Nickel     HANDS SWELL   Citrus Rash    Medications Reviewed Today     Reviewed by Gayla Medicus, RN (Registered Nurse) on 12/07/20 at [place] List Status: <None>   Medication Order Taking? Sig Documenting Provider Last  Dose Status Informant  albuterol (PROVENTIL) (2.5 MG/3ML) 0.083% nebulizer solution [id number] No Take 3 mLs (2.5 mg total) by nebulization every 6 (six) hours as needed for wheezing or shortness of breath. [name], [name], [name] Taking Active   atorvastatin (LIPITOR) 20 MG tablet [id number]  TAKE ONE TABLET BY MOUTH ONCE DAILY AT BEDTIME [name], MD  Active   budesonide-formoterol [facility]) 160-4.5 MCG/ACT inhaler [id number] No Inhale 2 puffs into the lungs 2 (two) times daily. [name], [name], PA-C Taking Active   buPROPion (WELLBUTRIN XL) 300 MG 24 hr tablet [id number] No Take 300 mg by mouth daily.  [provider] Taking Active Self  busPIRone (BUSPAR) 5 MG tablet [id number] No Take 5 mg by mouth 3 (three) times daily. [provider] Taking Active Self           Med Note [name], [facility]   Wed Feb 18, 2018 10:03 PM)    citalopram (CELEXA) 40 MG tablet [id number] No Take 40 mg by mouth daily.  [provider] Taking Active Self  cyclobenzaprine (FLEXERIL) 10 MG tablet [id number]  Take 1 tablet (10 mg total) by mouth 3 (three) times daily as needed for muscle spasms. [name], MD  Active   doxepin (SINEQUAN) 10 MG capsule [id number] No Take 10 mg by mouth. [provider] Taking Active   doxycycline (VIBRA-TABS) 100 MG tablet [id number]  Take 1 tablet (100 mg total) by mouth 2 (two) times daily. [name], MD  Active   ELIQUIS 5 MG TABS tablet [id number]  TAKE ONE TABLET BY MOUTH  TWICE DAILY MORNING AND EVENING [name], MD  Active   gabapentin (NEURONTIN) 300 MG capsule [id number]  TAKE 1 CAPSULE (300 MG TOTAL) BY MOUTH 3 (THREE) TIMES DAILY. [name], MD  Active   hydrochlorothiazide (MICROZIDE) 12.5 MG capsule [id number]  TAKE ONE CAPSULE BY MOUTH ONCE DAILY(AM) [name], MD  Active   metoprolol tartrate (LOPRESSOR) 50 MG tablet [id number]  TAKE ONE TABLET BY MOUTH TWICE DAILY [name], MD   Active   montelukast (SINGULAIR) 10 MG tablet [id number]  TAKE 1 TABLET BY MOUTH EVERY EVENING AT BEDTIME [name], MD  Active   PAZEO 0.7 % SOLN [id number] No   Patient not taking: Reported on 07/21/2020   [provider] Not Taking Active   prazosin (MINIPRESS) 1 MG capsule [id number] No Take 6 mg by mouth at bedtime. [provider] Taking Active Self  sodium chloride (OCEAN) 0.65 % SOLN nasal spray [id number]  Place 1 spray into both nostrils as needed for congestion. [name], MD  Active   SYNTHROID 200 MCG tablet [id number] No 175 mcg. [provider] Taking Active Self  topiramate (TOPAMAX) 200 MG tablet [id number] No TAKE 1 TABLET (200 MG TOTAL) BY MOUTH AT BEDTIME. [name], DO Taking Active   TRULICITY 3.15 VV/6.1YW SOPN [id number] No Inject 1.5 mg into the skin. [provider] Taking Active Self            Patient Active Problem List   Diagnosis Date Noted   At high risk for falls 04/20/2020   Hyperlipidemia 02/16/2020   Adrenal mass, right ([place]) 04/29/2019   Chronic cough 03/12/2018   Left shoulder pain 10/14/2016   Hypertension 10/14/2016   S/P thyroidectomy 06/27/2016   Chronic bilateral low back pain without sciatica 04/22/2016   Liver lesion 12/24/2015   Controlled type 2 diabetes mellitus with complication, with long-term current use of insulin ([place]) 10/10/2015   Vision loss 08/25/2015   Decreased vision in both eyes 08/25/2015   Prolonged Q-T interval on ECG 08/19/2015   History of pulmonary embolism 08/10/2015   Mild persistent asthma 04/13/2015   Fibroid, uterine    Morbid obesity ([place]) 11/11/2014   Atypical chest pain 11/08/2014   Sinus tachycardia 11/08/2014   Migraine 10/14/2014   Menorrhagia 09/08/2014    Conditions to be addressed/monitored per PCP order:  chronic healthcare management needs,  chronic healthcare management needs, DM2, asthma, glaucoma, chronic pain, anxiety,HTN,  migraines..  Care Plan : General Plan of Care (Adult)  Updates made by Gayla Medicus, RN since 12/07/2020 12:00 AM     Problem: Health Promotion or Disease Self-Management (General Plan of Care)   Priority: Medium  Onset Date: 10/20/2020     Long-Range Goal: Self-Management Plan Developed   Start Date: 07/14/2020  Expected End Date: 03/09/2021  Recent Progress: On track  Priority: Medium  Note:    Current Barriers:  Chronic Disease Management support and education needs  Patient with increased anxiety and depression-has appointment with Dr. [name] scheduled every 3 months-psychotherapy recommended. Update 12/07/20:  Referral made to [place] for therapy services. Patient complaining of nose bleeds and productive cough with green sputum. Patient has appointment with ENT 09/22/20 at 130. Update 09/19/20:  Patient states nose bleeds continue daily-? Increase.  Cough with clear sputum now. Update 10/20/20:  Patient states nose bleeds have improved after seeing Dr. Benjamine Mola, no cough. Patient with increased uterine bleeding-changing pad every 1-2 hours-h/o uterine fibroids.  To schedule appointment  with GYN at [place] [facility]. Update 09/19/20:  Patient states uterine bleeding has stopped.  Patient still needs to schedule appointment with GYN. Update 10/20/20:  No bleeding reported-has appointment with Dr. Wynetta Emery for follow up. Patient states she fell out of bed and now having shoulder and neck pain-to see MD today, 09/11/20. Update 09/19/20:  Patient states shoulder and neck pain have resolved and did not need to see MD-c/o left knee pain-using ice which is helping. Update 10/20/20:  Some knee pain continues-to see provider as needed. Update 12/07/20:  Patient states that shoulder, neck, and knee pain are not a problem right now-does have some back pain and is using heating pad with help.  Nurse Case Manager Clinical Goal(s):  Over the next 90 days, patient will attend all scheduled medical  appointments:  Update 09/11/20:  Patient is attending all appointments. Over the next 30 days, patient will work with CM team pharmacist to review medications. Update 08/14/20:  patient met with CM team pharmacist 07/21/20 and is following.  Interventions:  Inter-disciplinary care team collaboration (see longitudinal plan of care) Evaluation of current treatment plan  and patient's adherence to plan as established by provider. Reviewed medications with patient. Collaborated with pharmacy regarding medications. Discussed plans with patient for ongoing care management follow up and provided patient with direct contact information for care management team Reviewed scheduled/upcoming provider appointments. Collaborated with SW for psychotherapy referral. SW referral for psychotherapy provider. Pharmacy referral for medication review. Update 08/14/20:  Encouraged patient to call PCP for evaluation of nose bleeds and productive cough. Update 09/11/20:  Patient evaluated by PCP 08/21/20, has appointment with ENT scheduled 09/22/20 at 130.  Patient to also schedule appointment with GYN for uterine bleeding and see MD for shoulder and neck pain. Update 09/19/20:  Shoulder and neck pain improved-doesn't feel like she needs to see MD.  Still needs to schedule appointment with GYN. Update 10/20/20:  Patient has an appointment with Dr. Wynetta Emery for GYN evaluation. Update 12/07/20:  Referral has been made to [facility] for therapy services-patient awaiting appointment.  Self Care Activities: Over the next 90 days, patient will:  -Self administers medications as prescribed Attends all scheduled provider appointments Calls pharmacy for medication refills Calls provider office for new concerns or questions  Follow Up Plan: The Managed Medicaid care management team will reach out to the patient again over the next 30 days.  The patient has been provided with contact information for the Managed Medicaid care management  team and has been advised to call with any health related questions or concerns.   Evidence-based guidance:  Review biopsychosocial determinants of health screens.  Determine level of modifiable health risk.  Assess level of patient activation, level of readiness, importance and confidence to make changes.  Evoke change talk using open-ended questions, pros and cons, as well as looking forward.  Identify areas where behavior change may lead to improved health.  Partner with patient to develop a robust self-management plan that includes lifestyle factors, such as weight loss, exercise and healthy nutrition, as well as goals specific to disease risks.  Support patient and family/caregiver active participation in decision-making and self-management plan.  Implement additional goals and interventions based on identified risk factors to reduce health risk.  Facilitate advance care planning.  Review need for preventive screening based on age, sex, family history and health history.       Follow Up:  Patient agrees to Care Plan and Follow-up.  Plan: The Managed Medicaid care  management team will reach out to the patient again over the next 30 days. and The patient has been provided with contact information for the Managed Medicaid care management team and has been advised to call with any health related questions or concerns.  Date/time of next scheduled RN care management/care coordination outreach: 01/02/21 at 1245.

## 2020-12-14 ENCOUNTER — Other Ambulatory Visit: Payer: Self-pay

## 2020-12-14 NOTE — Patient Instructions (Signed)
Visit Information  Melinda [name] was given information about Medicaid Managed Care team care coordination services as a part of their [place] Medicaid benefit. Melinda [name] verbally consented to engagement with the [facility] Managed Care team.   For questions related to your [facility], please call: [phone] or visit the homepage here: [email or web address]  If you would like to schedule transportation through your [facility], please call the following number at least 2 days in advance of your appointment: [phone].   Call the [place] at [phone], at any time, 24 hours a day, 7 days a week. If you are in danger or need immediate medical attention call 911.  Melinda [name] - following are the goals we discussed in your visit today:   Goals Addressed   None     Social Worker will contact [place] and follow up with patient.   Melinda [name], BSW, [place] Managed Medicaid Team  [phone]   Following is a copy of your plan of care:

## 2020-12-14 NOTE — Patient Outreach (Signed)
Medicaid Managed Care Social Work Note  12/14/2020 Name:  Melinda [name] MRN:  [id number] DOB:  1966/03/07  Melinda [name] is an 55 y.o. year old female who is a primary patient of [name], MD.  The Medicaid Managed Care Coordination team was consulted for assistance with:  [place] and Resources  Ms. [name] was given information about Medicaid Managed CareCoordination services today. [name] agreed to services and verbal consent obtained.  Engaged with patient  for by telephone forfollow up visit in response to referral for case management and/or care coordination services.   Assessments/Interventions:  Review of past medical history, allergies, medications, health status, including review of consultants reports, laboratory and other test data, was performed as part of comprehensive evaluation and provision of chronic care management services.  SDOH: (Social Determinant of Health) assessments and interventions performed:  BSW followed up with patient. Patient stated that she has not heard anything back about getting a therapy appointment scheduled. BSW checked [facility] and patient was matched with [name] with [facility]. BSW informed patient a text message was sent to her, patient stated she has not received anything. BSW will contact [facility].   Advanced Directives Status:  Not addressed in this encounter.  Care Plan                 Allergies  Allergen Reactions   Ceftriaxone Anaphylaxis    ROCEPHIN   Penicillins Shortness Of Breath    Has patient had a PCN reaction causing immediate rash, facial/tongue/throat swelling, SOB or lightheadedness with hypotension: Yes Has patient had a PCN reaction causing severe rash involving mucus membranes or skin necrosis: No Has patient had a PCN reaction that required hospitalization No Has patient had a PCN reaction occurring within the last 10 years: No If all of the above answers are "NO", then may proceed with  Cephalosporin use.    Shellfish Allergy Anaphylaxis   Flonase [Fluticasone Propionate]     Makes migraines worse   Gold-Containing Drug Products     HANDS ITCH   Nickel     HANDS SWELL   Citrus Rash    Medications Reviewed Today     Reviewed by Gayla Medicus, RN (Registered Nurse) on 12/07/20 at [place] List Status: <None>   Medication Order Taking? Sig Documenting Provider Last Dose Status Informant  albuterol (PROVENTIL) (2.5 MG/3ML) 0.083% nebulizer solution [id number] No Take 3 mLs (2.5 mg total) by nebulization every 6 (six) hours as needed for wheezing or shortness of breath. [name], [name], [name] Taking Active   atorvastatin (LIPITOR) 20 MG tablet [id number]  TAKE ONE TABLET BY MOUTH ONCE DAILY AT BEDTIME [name], MD  Active   budesonide-formoterol [facility]) 160-4.5 MCG/ACT inhaler [id number] No Inhale 2 puffs into the lungs 2 (two) times daily. [name], [name], PA-C Taking Active   buPROPion (WELLBUTRIN XL) 300 MG 24 hr tablet [id number] No Take 300 mg by mouth daily.  [provider] Taking Active Self  busPIRone (BUSPAR) 5 MG tablet [id number] No Take 5 mg by mouth 3 (three) times daily. [provider] Taking Active Self           Med Note [name], [facility] I   Wed Feb 18, 2018 10:03 PM)    citalopram (CELEXA) 40 MG tablet [id number] No Take 40 mg by mouth daily.  [provider] Taking Active Self  cyclobenzaprine (FLEXERIL) 10 MG tablet [id number]  Take 1 tablet (10 mg total) by mouth 3 (three)  times daily as needed for muscle spasms. [name], MD  Active   doxepin (SINEQUAN) 10 MG capsule [id number] No Take 10 mg by mouth. [provider] Taking Active   doxycycline (VIBRA-TABS) 100 MG tablet [id number]  Take 1 tablet (100 mg total) by mouth 2 (two) times daily. [name], MD  Active   ELIQUIS 5 MG TABS tablet [id number]  TAKE ONE TABLET BY MOUTH TWICE DAILY MORNING AND [name], MD   Active   gabapentin (NEURONTIN) 300 MG capsule [id number]  TAKE 1 CAPSULE (300 MG TOTAL) BY MOUTH 3 (THREE) TIMES DAILY. [name], MD  Active   hydrochlorothiazide (MICROZIDE) 12.5 MG capsule [id number]  TAKE ONE CAPSULE BY MOUTH ONCE DAILY(AM) [name], MD  Active   metoprolol tartrate (LOPRESSOR) 50 MG tablet [id number]  TAKE ONE TABLET BY MOUTH TWICE DAILY [name], MD  Active   montelukast (SINGULAIR) 10 MG tablet [id number]  TAKE 1 TABLET BY MOUTH EVERY EVENING AT BEDTIME [name], MD  Active   PAZEO 0.7 % SOLN [id number] No   Patient not taking: Reported on 07/21/2020   [provider] Not Taking Active   prazosin (MINIPRESS) 1 MG capsule [id number] No Take 6 mg by mouth at bedtime. [provider] Taking Active Self  sodium chloride (OCEAN) 0.65 % SOLN nasal spray [id number]  Place 1 spray into both nostrils as needed for congestion. [name], MD  Active   SYNTHROID 200 MCG tablet [id number] No 175 mcg. [provider] Taking Active Self  topiramate (TOPAMAX) 200 MG tablet [id number] No TAKE 1 TABLET (200 MG TOTAL) BY MOUTH AT BEDTIME. [name], DO Taking Active   TRULICITY 6.96 VE/9.3YB SOPN [id number] No Inject 1.5 mg into the skin. [provider] Taking Active Self            Patient Active Problem List   Diagnosis Date Noted   At high risk for falls 04/20/2020   Hyperlipidemia 02/16/2020   Adrenal mass, right ([place]) 04/29/2019   Chronic cough 03/12/2018   Left shoulder pain 10/14/2016   Hypertension 10/14/2016   S/P thyroidectomy 06/27/2016   Chronic bilateral low back pain without sciatica 04/22/2016   Liver lesion 12/24/2015   Controlled type 2 diabetes mellitus with complication, with long-term current use of insulin ([place]) 10/10/2015   Vision loss 08/25/2015   Decreased vision in both eyes 08/25/2015   Prolonged Q-T interval on ECG 08/19/2015   History of pulmonary embolism 08/10/2015    Mild persistent asthma 04/13/2015   Fibroid, uterine    Morbid obesity ([place]) 11/11/2014   Atypical chest pain 11/08/2014   Sinus tachycardia 11/08/2014   Migraine 10/14/2014   Menorrhagia 09/08/2014    Conditions to be addressed/monitored per PCP order:  Anxiety  There are no care plans that you recently modified to display for this patient.   Follow up:  Patient agrees to Care Plan and Follow-up.  Plan: The Managed Medicaid care management team will reach out to the patient again over the next 30 days.    [name], BSW, [place] Managed Medicaid Team  [phone]

## 2020-12-19 ENCOUNTER — Ambulatory Visit: Payer: Medicaid Other | Attending: Internal Medicine | Admitting: Internal Medicine

## 2020-12-19 ENCOUNTER — Telehealth: Payer: Self-pay | Admitting: Internal Medicine

## 2020-12-19 ENCOUNTER — Encounter: Payer: Self-pay | Admitting: Internal Medicine

## 2020-12-19 ENCOUNTER — Other Ambulatory Visit: Payer: Self-pay

## 2020-12-19 DIAGNOSIS — F331 Major depressive disorder, recurrent, moderate: Secondary | ICD-10-CM | POA: Diagnosis not present

## 2020-12-19 DIAGNOSIS — I10 Essential (primary) hypertension: Secondary | ICD-10-CM

## 2020-12-19 DIAGNOSIS — M79671 Pain in right foot: Secondary | ICD-10-CM | POA: Insufficient documentation

## 2020-12-19 DIAGNOSIS — Z87891 Personal history of nicotine dependence: Secondary | ICD-10-CM | POA: Diagnosis not present

## 2020-12-19 DIAGNOSIS — E1169 Type 2 diabetes mellitus with other specified complication: Secondary | ICD-10-CM

## 2020-12-19 DIAGNOSIS — F419 Anxiety disorder, unspecified: Secondary | ICD-10-CM | POA: Insufficient documentation

## 2020-12-19 DIAGNOSIS — G43909 Migraine, unspecified, not intractable, without status migrainosus: Secondary | ICD-10-CM | POA: Diagnosis not present

## 2020-12-19 DIAGNOSIS — E119 Type 2 diabetes mellitus without complications: Secondary | ICD-10-CM | POA: Diagnosis present

## 2020-12-19 DIAGNOSIS — Z794 Long term (current) use of insulin: Secondary | ICD-10-CM | POA: Diagnosis not present

## 2020-12-19 DIAGNOSIS — Z79899 Other long term (current) drug therapy: Secondary | ICD-10-CM | POA: Diagnosis not present

## 2020-12-19 DIAGNOSIS — Z833 Family history of diabetes mellitus: Secondary | ICD-10-CM | POA: Diagnosis not present

## 2020-12-19 DIAGNOSIS — Z7951 Long term (current) use of inhaled steroids: Secondary | ICD-10-CM | POA: Insufficient documentation

## 2020-12-19 DIAGNOSIS — Z23 Encounter for immunization: Secondary | ICD-10-CM

## 2020-12-19 DIAGNOSIS — Z1331 Encounter for screening for depression: Secondary | ICD-10-CM | POA: Diagnosis not present

## 2020-12-19 DIAGNOSIS — M79674 Pain in right toe(s): Secondary | ICD-10-CM | POA: Insufficient documentation

## 2020-12-19 DIAGNOSIS — Z86711 Personal history of pulmonary embolism: Secondary | ICD-10-CM | POA: Insufficient documentation

## 2020-12-19 DIAGNOSIS — E1142 Type 2 diabetes mellitus with diabetic polyneuropathy: Secondary | ICD-10-CM | POA: Diagnosis not present

## 2020-12-19 DIAGNOSIS — J4540 Moderate persistent asthma, uncomplicated: Secondary | ICD-10-CM | POA: Diagnosis not present

## 2020-12-19 DIAGNOSIS — Z7901 Long term (current) use of anticoagulants: Secondary | ICD-10-CM | POA: Insufficient documentation

## 2020-12-19 DIAGNOSIS — L309 Dermatitis, unspecified: Secondary | ICD-10-CM | POA: Diagnosis not present

## 2020-12-19 DIAGNOSIS — Z6841 Body Mass Index (BMI) 40.0 and over, adult: Secondary | ICD-10-CM | POA: Insufficient documentation

## 2020-12-19 DIAGNOSIS — F411 Generalized anxiety disorder: Secondary | ICD-10-CM | POA: Diagnosis not present

## 2020-12-19 DIAGNOSIS — Z86718 Personal history of other venous thrombosis and embolism: Secondary | ICD-10-CM | POA: Diagnosis not present

## 2020-12-19 DIAGNOSIS — F4310 Post-traumatic stress disorder, unspecified: Secondary | ICD-10-CM | POA: Diagnosis not present

## 2020-12-19 LAB — GLUCOSE, POCT (MANUAL RESULT ENTRY): POC Glucose: 143 mg/dl — AB (ref 70–99)

## 2020-12-19 MED ORDER — BUDESONIDE-FORMOTEROL FUMARATE 160-4.5 MCG/ACT IN AERO: 2.0000 | INHALATION_SPRAY | Freq: Two times a day (BID) | RESPIRATORY_TRACT | 6 refills | Status: DC

## 2020-12-19 MED ORDER — ALBUTEROL SULFATE HFA 108 (90 BASE) MCG/ACT IN AERS: 2.0000 | INHALATION_SPRAY | Freq: Four times a day (QID) | RESPIRATORY_TRACT | 2 refills | Status: AC | PRN

## 2020-12-19 MED ORDER — TRIAMCINOLONE ACETONIDE 0.1 % EX CREA: 1.0000 "application " | TOPICAL_CREAM | Freq: Two times a day (BID) | CUTANEOUS | 0 refills | Status: DC

## 2020-12-19 MED ORDER — ALBUTEROL SULFATE (2.5 MG/3ML) 0.083% IN NEBU: 2.5000 mg | INHALATION_SOLUTION | Freq: Four times a day (QID) | RESPIRATORY_TRACT | 3 refills | Status: AC | PRN

## 2020-12-19 MED ORDER — PREDNISONE 10 MG PO TABS: 10.0000 mg | ORAL_TABLET | Freq: Every day | ORAL | 0 refills | Status: DC

## 2020-12-19 NOTE — Telephone Encounter (Signed)
Rxn sent to [facility] for Albuterol inhaler.

## 2020-12-19 NOTE — Progress Notes (Signed)
Pt states she is having pain come from her right big toe

## 2020-12-19 NOTE — Telephone Encounter (Signed)
Will forward to provider  

## 2020-12-19 NOTE — Progress Notes (Signed)
Patient ID: Melinda [name], female    DOB: 1966/04/04  MRN: [id number]  CC: Diabetes and Hypertension   Subjective: Melinda [name] is a 55 y.o. female who presents for chronic ds management Her concerns today include:  Pt with hx of DM (followed by Dr. 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   Epistaxis:  saw ENT since last visit.  Had a cauterization procedure.  No further bleeding since then.    HTN: compliant with HCTZ and Metoprolol.  Limits salt in foods. No CP/LE edema.  DM/Obesity:  followed by endocrinologist Dr. Buddy Duty.  Last seen 09/15/20.  Trulicity increased to 1.5 mg/wk and Synthroid dec to 175 mcg from 200 mcg.  A1C was 5.4.  She does not check blood sugars.   Repeat thyroid level was normal.  Reports eating habits "sometimes its good and sometimes it is bad." Trying to find a balance.  Saw nutritionist in past and does not feel she needs refresher.  Trying to move more.    DVT/PE:  no bruising or bleeding. Compliant with Eliquis.  C/o pain in RT big toe x few days. No known injury.  Not sure if there is any redness or swelling  Complains of itchy rash on left elbow that comes and goes for past few mths.  No initiating factors..  Needing refill on Symbicort and albuterol inhaler.  Doing well on current inhalers.  Patient Active Problem List   Diagnosis Date Noted   At high risk for falls 04/20/2020   Hyperlipidemia 02/16/2020   Adrenal mass, right ([place]) 04/29/2019   Chronic cough 03/12/2018   Left shoulder pain 10/14/2016   Hypertension 10/14/2016   S/P thyroidectomy 06/27/2016   Chronic bilateral low back pain without sciatica 04/22/2016   Liver lesion 12/24/2015   Controlled type 2 diabetes mellitus with complication, with long-term current use of insulin ([place]) 10/10/2015    Vision loss 08/25/2015   Decreased vision in both eyes 08/25/2015   Prolonged Q-T interval on ECG 08/19/2015   History of pulmonary embolism 08/10/2015   Mild persistent asthma 04/13/2015   Fibroid, uterine    Morbid obesity ([place]) 11/11/2014   Atypical chest pain 11/08/2014   Sinus tachycardia 11/08/2014   Migraine 10/14/2014   Menorrhagia 09/08/2014     Current Outpatient Medications on File Prior to Visit  Medication Sig Dispense Refill   albuterol (PROVENTIL) (2.5 MG/3ML) 0.083% nebulizer solution Take 3 mLs (2.5 mg total) by nebulization every 6 (six) hours as needed for wheezing or shortness of breath. 3 mL 3   atorvastatin (LIPITOR) 20 MG tablet TAKE ONE TABLET BY MOUTH ONCE DAILY AT BEDTIME 30 tablet 2   budesonide-formoterol (SYMBICORT) 160-4.5 MCG/ACT inhaler Inhale 2 puffs into the lungs 2 (two) times daily. 2 Inhaler 0   buPROPion (WELLBUTRIN XL) 300 MG 24 hr tablet Take 300 mg by mouth daily.   2   busPIRone (BUSPAR) 5 MG tablet Take 5 mg by mouth 3 (three) times daily.     citalopram (CELEXA) 40 MG tablet Take 40 mg by mouth daily.   2   cyclobenzaprine (FLEXERIL) 10 MG tablet Take 1 tablet (10 mg total) by mouth 3 (three) times daily as needed for muscle spasms. 30 tablet 2   doxepin (SINEQUAN) 10 MG capsule Take 10 mg by mouth.     ELIQUIS 5 MG TABS tablet TAKE ONE  TABLET BY MOUTH TWICE DAILY MORNING AND EVENING 60 tablet 2   gabapentin (NEURONTIN) 300 MG capsule TAKE 1 CAPSULE (300 MG TOTAL) BY MOUTH 3 (THREE) TIMES DAILY. 90 capsule 0   hydrochlorothiazide (MICROZIDE) 12.5 MG capsule TAKE ONE CAPSULE BY MOUTH ONCE DAILY(AM) 30 capsule 2   metoprolol tartrate (LOPRESSOR) 50 MG tablet TAKE ONE TABLET BY MOUTH TWICE DAILY 60 tablet 2   montelukast (SINGULAIR) 10 MG tablet TAKE 1 TABLET BY MOUTH EVERY EVENING AT BEDTIME 30 tablet 2   PAZEO 0.7 % SOLN  (Patient not taking: Reported on 07/21/2020)     prazosin (MINIPRESS) 1 MG capsule Take 6 mg by mouth at bedtime.     sodium  chloride (OCEAN) 0.65 % SOLN nasal spray Place 1 spray into both nostrils as needed for congestion. 30 mL 0   SYNTHROID 200 MCG tablet 175 mcg.     topiramate (TOPAMAX) 200 MG tablet TAKE 1 TABLET (200 MG TOTAL) BY MOUTH AT BEDTIME. 90 tablet 3   TRULICITY 8.41 LK/4.4WN SOPN Inject 1.5 mg into the skin.     No current facility-administered medications on file prior to visit.    Allergies  Allergen Reactions   Ceftriaxone Anaphylaxis    ROCEPHIN   Penicillins Shortness Of Breath    Has patient had a PCN reaction causing immediate rash, facial/tongue/throat swelling, SOB or lightheadedness with hypotension: Yes Has patient had a PCN reaction causing severe rash involving mucus membranes or skin necrosis: No Has patient had a PCN reaction that required hospitalization No Has patient had a PCN reaction occurring within the last 10 years: No If all of the above answers are "NO", then may proceed with Cephalosporin use.    Shellfish Allergy Anaphylaxis   Flonase [Fluticasone Propionate]     Makes migraines worse   Gold-Containing Drug Products     HANDS ITCH   Nickel     HANDS SWELL   Citrus Rash    Social History   Socioeconomic History   Marital status: Single    Spouse name: Not on file   Number of children: 1   Years of education: 13   Highest education level: Some college, no degree  Occupational History   Occupation: disabled  Tobacco Use   Smoking status: Former    Packs/day: 0.50    Years: 40.00    Pack years: 20.00    Types: Cigarettes    Quit date: 11/03/2014    Years since quitting: 6.1   Smokeless tobacco: Never  Vaping Use   Vaping Use: Never used  Substance and Sexual Activity   Alcohol use: No    Alcohol/week: 0.0 standard drinks   Drug use: No   Sexual activity: Never    Birth control/protection: None  Other Topics Concern   Not on file  Social History Narrative   Patient is right-handed. She lives in a one level handicap accessible apartment. She  avoids caffeine. She uses stationary stationary pedals to use for exercise.   Social Determinants of Health   Financial Resource Strain: Not on file  Food Insecurity: Not on file  Transportation Needs: Not on file  Physical Activity: Not on file  Stress: Not on file  Social Connections: Not on file  Intimate Partner Violence: Not on file    Family History  Problem Relation Age of Onset   Diabetes Mother    Breast cancer Mother    CAD Mother    Hypertension Mother    Alcohol abuse Father  Hypertension Father    Breast cancer Maternal Aunt    Breast cancer Maternal Aunt     Past Surgical History:  Procedure Laterality Date   ANGIOGRAM TO LEG  08-13-15   RIGHT   CHOLECYSTECTOMY     IR GENERIC HISTORICAL  04/04/2016   IR RADIOLOGIST EVAL & MGMT 04/04/2016 [name], MD [facility] INTERV RAD   THYROIDECTOMY N/A 11/17/2015   Procedure: TOTAL THYROIDECTOMY;  Surgeon: Armandina Gemma, MD;  Location: [facility] ORS;  Service: General;  Laterality: N/A;   UTERINE ARTERY EMBOLIZATION Bilateral 08/13/2015    ROS: Review of Systems Negative except as stated above  PHYSICAL EXAM: BP 117/82   Pulse 80   Resp 16   Wt (!) 335 lb 9.6 oz (152.2 kg)   SpO2 (!) 7%   BMI 48.15 kg/m   Wt Readings from Last 3 Encounters:  12/19/20 (!) 335 lb 9.6 oz (152.2 kg)  08/21/20 (!) 333 lb 3.2 oz (151.1 kg)  05/18/20 (!) 334 lb (151.5 kg)    Physical Exam  General appearance - alert, well appearing, morbidly obese middle-age African-American female and in no distress Mental status - normal mood, behavior, speech, dress, motor activity, and thought processes Neck - supple, no significant adenopathy Chest - clear to auscultation, no wheezes, rales or rhonchi, symmetric air entry Heart - normal rate, regular rhythm, normal S1, S2, no murmurs, rubs, clicks or gallops Extremities - peripheral pulses normal, no pedal edema, no clubbing or cyanosis MSK: Right big toe -no edema or erythema.  No point tenderness  around the nailbed.  She has some mild to moderate discomfort with passive range of motion of the toe joint. Skin: Several papules in crops noted around the lateral aspect of the left elbow. Diabetic Foot Exam - Simple   Simple Foot Form Visual Inspection See comments: Yes Sensation Testing See comments: Yes Pulse Check Posterior Tibialis and Dorsalis pulse intact bilaterally: Yes Comments Ingrown toenails of both big toes.  Other toenails are slightly overgrown.  Decreased sensation on leap exam on the plantar surface of both feet.    Depression screen [facility] 2/9 12/19/2020 04/20/2020 01/19/2020  Decreased Interest 1 1 1   Down, Depressed, Hopeless 1 1 0  PHQ - 2 Score 2 2 1   Altered sleeping 3 0 0  Tired, decreased energy 2 0 3  Change in appetite 2 0 2  Feeling bad or failure about yourself  0 0 0  Trouble concentrating 3 3 1   Moving slowly or fidgety/restless 0 3 1  Suicidal thoughts 0 0 0  PHQ-9 Score 12 8 8   Difficult doing work/chores - - Not difficult at all  Some recent data might be hidden      CMP Latest Ref Rng & Units 04/20/2020 12/30/2019 04/29/2019  Glucose 70 - 99 mg/dL - 128(H) 113(H)  BUN 6 - 20 mg/dL - 10 15  Creatinine 0.44 - 1.00 mg/dL - 0.85 1.07(H)  Sodium 135 - 145 mmol/L - 140 142  Potassium 3.5 - 5.1 mmol/L - 3.4(L) 3.7  Chloride 98 - 111 mmol/L - 112(H) 107(H)  CO2 22 - 32 mmol/L - 20(L) 22  Calcium 8.9 - 10.3 mg/dL - 8.3(L) 9.1  Total Protein 6.0 - 8.5 g/dL 6.8 - 7.1  Total Bilirubin 0.0 - 1.2 mg/dL 0.4 - 0.3  Alkaline Phos 44 - 121 IU/L 136(H) - 135(H)  AST 0 - 40 IU/L 14 - 11  ALT 0 - 32 IU/L 26 - 12   Lipid Panel  Component Value Date/Time   CHOL 130 04/20/2020 1133   TRIG 95 04/20/2020 1133   HDL 44 04/20/2020 1133   CHOLHDL 3.0 04/20/2020 1133   CHOLHDL 2.7 09/08/2014 0147   VLDL 18 09/08/2014 0147   LDLCALC 68 04/20/2020 1133    CBC    Component Value Date/Time   WBC 7.9 08/25/2020 1116   WBC 10.1 12/30/2019 1530   RBC 5.22  08/25/2020 1116   RBC 4.86 12/30/2019 1530   HGB 15.7 08/25/2020 1116   HCT 46.6 08/25/2020 1116   PLT 282 12/30/2019 1530   PLT 289 04/29/2019 1106   MCV 89 08/25/2020 1116   MCH 30.1 08/25/2020 1116   MCH 29.2 12/30/2019 1530   MCHC 33.7 08/25/2020 1116   MCHC 32.3 12/30/2019 1530   RDW 13.1 08/25/2020 1116   LYMPHSABS 1.8 08/25/2020 1116   MONOABS 0.6 02/18/2018 2337   EOSABS 0.1 08/25/2020 1116   BASOSABS 0.0 08/25/2020 1116    ASSESSMENT AND PLAN: 1. Type 2 diabetes mellitus with morbid obesity ([place]) Reported A1c at goal. Encourage healthy eating habits.  We came up with a plan that she should eat healthy all through the week and then splurge 1 day on the weekend.  Encourage her to move as much as she can - POCT glucose (manual entry)  2. Essential hypertension At goal.  Continue current medications and low-salt diet - Comprehensive metabolic panel  3. Pain of toe of right foot Questionable gout versus OA.  I do not want to use NSAIDs given that she is on Eliquis.  We will try her with low-dose of prednisone for 4 days and check uric acid level. - predniSONE (DELTASONE) 10 MG tablet; Take 1 tablet (10 mg total) by mouth daily with breakfast.  Dispense: 4 tablet; Refill: 0 - Uric Acid  4. Dermatitis Questionable etiology.  Give trial of Kenalog cream.  Let me know if it does not resolve. - triamcinolone cream (KENALOG) 0.1 %; Apply 1 application topically 2 (two) times daily.  Dispense: 30 g; Refill: 0  5. Moderate persistent asthma without complication Controlled on current inhalers. - albuterol (PROVENTIL) (2.5 MG/3ML) 0.083% nebulizer solution; Take 3 mLs (2.5 mg total) by nebulization every 6 (six) hours as needed for wheezing or shortness of breath.  Dispense: 3 mL; Refill: 3 - budesonide-formoterol (SYMBICORT) 160-4.5 MCG/ACT inhaler; Inhale 2 puffs into the lungs 2 (two) times daily.  Dispense: 2 each; Refill: 6  6. History of DVT in adulthood Continue  Eliquis  7. Need for shingles vaccine Patient agreeable to receiving shingles vaccine today.  Also due for pneumonia vaccine but we are currently out of the Prevnar 20. - Varicella-zoster vaccine IM (Shingrix)  8. Positive depression screening Plugged into behavioral health services already.    Patient was given the opportunity to ask questions.  Patient verbalized understanding of the plan and was able to repeat key elements of the plan.   Orders Placed This Encounter  Procedures   POCT glucose (manual entry)     Requested Prescriptions   Pending Prescriptions Disp Refills   albuterol (PROVENTIL) (2.5 MG/3ML) 0.083% nebulizer solution 3 mL 3    Sig: Take 3 mLs (2.5 mg total) by nebulization every 6 (six) hours as needed for wheezing or shortness of breath.   budesonide-formoterol (SYMBICORT) 160-4.5 MCG/ACT inhaler 2 each 0    Sig: Inhale 2 puffs into the lungs 2 (two) times daily.    No follow-ups on file.  [name], MD, FACP

## 2020-12-19 NOTE — Telephone Encounter (Signed)
Medication requested is not on current list Review for new script

## 2020-12-19 NOTE — Patient Instructions (Signed)

## 2020-12-19 NOTE — Telephone Encounter (Signed)
Copied from [place] [id number]. Topic: Quick Communication - Rx Refill/Question >> Dec 19, 2020 12:32 PM Leward Quan A wrote: Medication: Albuterol inhaler    Has the patient contacted their pharmacy? Yes.   (Agent: If no, request that the patient contact the pharmacy for the refill.) (Agent: If yes, when and what did the pharmacy advise?)  Preferred Pharmacy (with phone number or street name): [facility], [place]  Phone:  [phone] Fax:  [phone]     Agent: Please be advised that RX refills may take up to 3 business days. We ask that you follow-up with your pharmacy.

## 2020-12-20 ENCOUNTER — Other Ambulatory Visit: Payer: Self-pay | Admitting: Internal Medicine

## 2020-12-20 LAB — COMPREHENSIVE METABOLIC PANEL
ALT: 45 IU/L — ABNORMAL HIGH (ref 0–32)
AST: 29 IU/L (ref 0–40)
Albumin/Globulin Ratio: 1.6 (ref 1.2–2.2)
Albumin: 4.2 g/dL (ref 3.8–4.9)
Alkaline Phosphatase: 122 IU/L — ABNORMAL HIGH (ref 44–121)
BUN/Creatinine Ratio: 10 (ref 9–23)
BUN: 10 mg/dL (ref 6–24)
Bilirubin Total: 0.3 mg/dL (ref 0.0–1.2)
CO2: 21 mmol/L (ref 20–29)
Calcium: 8.5 mg/dL — ABNORMAL LOW (ref 8.7–10.2)
Chloride: 105 mmol/L (ref 96–106)
Creatinine, Ser: 1 mg/dL (ref 0.57–1.00)
Globulin, Total: 2.6 g/dL (ref 1.5–4.5)
Glucose: 113 mg/dL — ABNORMAL HIGH (ref 65–99)
Potassium: 3.6 mmol/L (ref 3.5–5.2)
Sodium: 142 mmol/L (ref 134–144)
Total Protein: 6.8 g/dL (ref 6.0–8.5)
eGFR: 68 mL/min/{1.73_m2} (ref >59)

## 2020-12-20 LAB — URIC ACID: Uric Acid: 5.1 mg/dL (ref 3.0–7.2)

## 2020-12-20 MED ORDER — VITAMIN D3 10 MCG (400 UNIT) PO TABS: 400.0000 [IU] | ORAL_TABLET | Freq: Every day | ORAL | 1 refills | Status: DC

## 2021-01-02 ENCOUNTER — Ambulatory Visit: Payer: Medicaid Other

## 2021-01-11 ENCOUNTER — Other Ambulatory Visit: Payer: Self-pay | Admitting: Internal Medicine

## 2021-01-11 ENCOUNTER — Telehealth: Payer: Self-pay | Admitting: Internal Medicine

## 2021-01-11 DIAGNOSIS — S46011D Strain of muscle(s) and tendon(s) of the rotator cuff of right shoulder, subsequent encounter: Secondary | ICD-10-CM

## 2021-01-11 MED ORDER — GABAPENTIN 300 MG PO CAPS: 300.0000 mg | ORAL_CAPSULE | Freq: Three times a day (TID) | ORAL | 0 refills | Status: DC

## 2021-01-11 NOTE — Telephone Encounter (Signed)
Pharmacy requesting a quantity of 90 day supply gabapentin (NEURONTIN) 300 MG capsule.   [place], [place] Phone:  [phone]  Fax:  [phone]

## 2021-01-11 NOTE — Telephone Encounter (Signed)
Copied from [place] [id number]. Topic: General - Other >> Jan 08, 2021  2:00 PM Pawlus, Brayton Layman A wrote: Reason for CRM: Pt had some questions about her immunizations and if she is due for anything. Please advise, pt did not know the names of the vaccines she is supposed to be getting.

## 2021-01-12 NOTE — Telephone Encounter (Signed)
Returned pt call pt was just inquiring on when to return to have 2nd shingles vaccine. Scheduled pt for 8/22 at 930 with Luke to get 2nd vaccine. Pt doesn't have any questions or concerns

## 2021-01-17 ENCOUNTER — Other Ambulatory Visit: Payer: Self-pay

## 2021-01-17 ENCOUNTER — Other Ambulatory Visit: Payer: Self-pay | Admitting: Obstetrics and Gynecology

## 2021-01-17 NOTE — Patient Instructions (Signed)
Hi Melinda [name], thank you for speaking with me today.  Have a great afternoon!  Melinda [name] was given information about Medicaid Managed Care team care coordination services as a part of their [place] Medicaid benefit. Melinda [name] verbally consented to engagement with the [facility] Managed Care team.   For questions related to your [facility], please call: [phone] or visit the homepage here: [email or web address]  If you would like to schedule transportation through your [facility], please call the following number at least 2 days in advance of your appointment: [phone].   Call the [place] at [phone], at any time, 24 hours a day, 7 days a week. If you are in danger or need immediate medical attention call 911.  If you would like help to quit smoking, call 1-800-QUIT-NOW (431) 263-6436) OR Espaol: 1-855-Djelo-Ya (2-725-366-4403) o para ms informacin haga clic aqu or Text READY to 200-400 to register via text  Melinda [name] - following are the goals we discussed in your visit today:   Goals Addressed             This Visit's Progress    Protect My Health       Timeframe:  Long-Range Goal Priority:  Medium Start Date:    07/14/20                         Expected End Date:    03/09/21            Follow Up Date:  02/17/21 - schedule appointment for vaccines needed due to my age or health - schedule recommended health tests (blood work, mammogram, colonoscopy, pap test) - schedule and keep appointment for annual check-up  Update 08/14/20:  patient has scheduled appointment with PCP 08/21/20 Update 09/11/20:  patient attended appointment with PCP on 08/21/20.  Has appointment scheduled with ENT 09/22/20 due to nose bleeds.  Encouraged patient to schedule appointment with GYN due to bleeding. Update 09/19/20:  Patient states vaginal bleeding has  stopped-still needs to schedule appointment with GYN.  Nosebleeds continue daily? More-has appointment 09/22/20 with ENT. Update 10/20/20:  Nose bleeds have stopped-has follow up appointment with Dr. Benjamine Mola on 10/24/20.  Has appointment with Dr. Wynetta Emery for GYN follow up. Update 11/09/20:  Patient attending all appointments-has blood work scheduled next week.  Appointment with Dr. Wynetta Emery 12/19/20. Update 12/07/20:  No new updates today-referral made to [facility] for therapist. Update 01/17/21:  Patient states she has an appointment for therapy.        Patient verbalizes understanding of instructions provided today.   The Managed Medicaid care management team will reach out to the patient again over the next 30 days.  The  Patient has been provided with contact information for the Managed Medicaid care management team and has been advised to call with any health related questions or concerns.   Aida Raider RN, BSN [place]  [facility] - Managed Medicaid High Risk [phone].   Following is a copy of your plan of care:   Patient Care Plan: General Plan of Care (Adult)     Problem Identified: Health Promotion or Disease Self-Management (General Plan of Care)   Priority: Medium  Onset Date: 10/20/2020     Long-Range Goal: Self-Management Plan Developed   Start Date: 07/14/2020  Expected End Date: 03/09/2021  Recent Progress: On track  Priority: Medium  Note:  Current Barriers:  Chronic Disease Management support and education needs  Patient with increased anxiety and depression-has appointment with Dr. [name] scheduled every 3 months-psychotherapy recommended. Update 12/07/20:  Referral made to [facility] for therapy services. Update 01/17/21:  Patient has appointment scheduled for therapy services. Patient complaining of nose bleeds and productive cough with green sputum. Patient has appointment with ENT 09/22/20 at 130. Update 09/19/20:  Patient  states nose bleeds continue daily-? Increase.  Cough with clear sputum now. Update 10/20/20:  Patient states nose bleeds have improved after seeing Dr. Benjamine Mola, no cough. Patient with increased uterine bleeding-changing pad every 1-2 hours-h/o uterine fibroids.  To schedule [facility]. Update 09/19/20:  Patient states uterine bleeding has stopped.  Patient still needs to schedule appointment with GYN. Update 10/20/20:  No bleeding reported-has appointment with Dr. Wynetta Emery for follow up. Patient states she fell out of bed and now having shoulder and neck pain-to see MD today, 09/11/20. Update 09/19/20:  Patient states shoulder and neck pain have resolved and did not need to see MD-c/o left knee pain-using ice which is helping. Update 10/20/20:  Some knee pain continues-to see provider as needed. Update 12/07/20:  Patient states that shoulder, neck, and knee pain are not a problem right now-does have some back pain and is using heating pad with help.  Nurse Case Manager Clinical Goal(s):  Over the next 90 days, patient will attend all scheduled medical appointments:  Update 09/11/20:  Patient is attending all appointments. Over the next 30 days, patient will work with CM team pharmacist to review medications. Update 08/14/20:  patient met with CM team pharmacist 07/21/20 and is following.  Interventions:  Inter-disciplinary care team collaboration (see longitudinal plan of care) Evaluation of current treatment plan  and patient's adherence to plan as established by provider. Reviewed medications with patient. Collaborated with pharmacy regarding medications. Discussed plans with patient for ongoing care management follow up and provided patient with direct contact information for care management team Reviewed scheduled/upcoming provider appointments. Collaborated with SW for psychotherapy referral. SW referral for psychotherapy provider. Pharmacy referral for  medication review. Update 08/14/20:  Encouraged patient to call PCP for evaluation of nose bleeds and productive cough. Update 09/11/20:  Patient evaluated by PCP 08/21/20, has appointment with ENT scheduled 09/22/20 at 130.  Patient to also schedule appointment with GYN for uterine bleeding and see MD for shoulder and neck pain. Update 09/19/20:  Shoulder and neck pain improved-doesn't feel like she needs to see MD.  Still needs to schedule appointment with GYN. Update 10/20/20:  Patient has an appointment with Dr. Wynetta Emery for GYN evaluation. Update 12/07/20:  Referral has been made to [facility] for therapy services-patient awaiting appointment. Update 01/17/21:  Patient states she has appointment for therapy.  Self Care Activities: Over the next 90 days, patient will:  -Self administers medications as prescribed Attends all scheduled provider appointments Calls pharmacy for medication refills Calls provider office for new concerns or questions  Follow Up Plan: The Managed Medicaid care management team will reach out to the patient again over the next 30 days.  The patient has been provided with contact information for the Managed Medicaid care management team and has been advised to call with any health related questions or concerns.   Evidence-based guidance:  Review biopsychosocial determinants of health screens.  Determine level of modifiable health risk.  Assess level of patient activation, level of readiness, importance and confidence to make changes.  Evoke change talk  using open-ended questions, pros and cons, as well as looking forward.  Identify areas where behavior change [name] lead to improved health.  Partner with patient to develop a robust self-management plan that includes lifestyle factors, such as weight loss, exercise and healthy nutrition, as well as goals specific to disease risks.  Support patient and family/caregiver active participation in decision-making and self-management  plan.  Implement additional goals and interventions based on identified risk factors to reduce health risk.  Facilitate advance care planning.  Review need for preventive screening based on age, sex, family history and health history.

## 2021-01-17 NOTE — Patient Outreach (Signed)
Medicaid Managed Care   Nurse Care Manager Note  01/17/2021 Name:  Melinda [name] MRN:  [id number] DOB:  [birth year]  Melinda [name] is an 55 y.o. year old female who is a primary patient of [name], MD.  The [facility] Managed Care Coordination team was consulted for assistance with:    Chronic healthcare management needs.  Ms. Hebenstreit was given information about Medicaid Managed Care Coordination team services today. [name] agreed to services and verbal consent obtained.  Engaged with patient by telephone for follow up visit in response to provider referral for case management and/or care coordination services.   Assessments/Interventions:  Review of past medical history, allergies, medications, health status, including review of consultants reports, laboratory and other test data, was performed as part of comprehensive evaluation and provision of chronic care management services.  SDOH (Social Determinants of Health) assessments and interventions performed:   Care Plan  Allergies  Allergen Reactions   Ceftriaxone Anaphylaxis    ROCEPHIN   Penicillins Shortness Of Breath    Has patient had a PCN reaction causing immediate rash, facial/tongue/throat swelling, SOB or lightheadedness with hypotension: Yes Has patient had a PCN reaction causing severe rash involving mucus membranes or skin necrosis: No Has patient had a PCN reaction that required hospitalization No Has patient had a PCN reaction occurring within the last 10 years: No If all of the above answers are "NO", then may proceed with Cephalosporin use.    Shellfish Allergy Anaphylaxis   Flonase [Fluticasone Propionate]     Makes migraines worse   Gold-Containing Drug Products     HANDS ITCH   Nickel     HANDS SWELL   Citrus Rash    Medications Reviewed Today     Reviewed by Gayla Medicus, RN (Registered Nurse) on 01/17/21 at 1259  Med List Status: <None>   Medication Order Taking? Sig Documenting Provider Last  Dose Status Informant  albuterol (PROVENTIL) (2.5 MG/3ML) 0.083% nebulizer solution [id number] Yes Take 3 mLs (2.5 mg total) by nebulization every 6 (six) hours as needed for wheezing or shortness of breath. [name], MD Taking Active   albuterol (VENTOLIN HFA) 108 (90 Base) MCG/ACT inhaler [id number] Yes Inhale 2 puffs into the lungs every 6 (six) hours as needed for wheezing or shortness of breath. [name], MD Taking Active   atorvastatin (LIPITOR) 20 MG tablet [id number]  TAKE ONE TABLET BY MOUTH ONCE DAILY AT BEDTIME [name], MD  Active   budesonide-formoterol [facility]) 160-4.5 MCG/ACT inhaler [id number]  Inhale 2 puffs into the lungs 2 (two) times daily. [name], MD  Active   buPROPion (WELLBUTRIN XL) 300 MG 24 hr tablet [id number]  Take 300 mg by mouth daily.  [provider]  Active Self  busPIRone (BUSPAR) 5 MG tablet [id number]  Take 5 mg by mouth 3 (three) times daily. [provider]  Active Self           Med Note [name], [facility]   Wed Feb 18, 2018 10:03 PM)    Cholecalciferol (VITAMIN D3) 10 MCG (400 UNIT) tablet [id number]  Take 1 tablet (400 Units total) by mouth daily. [name], MD  Active   citalopram (CELEXA) 40 MG tablet [id number]  Take 40 mg by mouth daily.  [provider]  Active Self  cyclobenzaprine (FLEXERIL) 10 MG tablet [id number]  Take 1 tablet (10 mg total) by mouth 3 (three) times daily as needed for muscle spasms. [name],  MD  Active   doxepin (SINEQUAN) 10 MG capsule [id number]  Take 10 mg by mouth. [provider]  Active   ELIQUIS 5 MG TABS tablet [id number]  TAKE ONE TABLET BY MOUTH TWICE DAILY MORNING AND EVENING [name], MD  Active   gabapentin (NEURONTIN) 300 MG capsule [id number]  Take 1 capsule (300 mg total) by mouth 3 (three) times daily. [name], MD  Active   hydrochlorothiazide (MICROZIDE) 12.5 MG capsule [id number]  TAKE ONE CAPSULE BY  MOUTH ONCE DAILY(AM) [name], MD  Active   metoprolol tartrate (LOPRESSOR) 50 MG tablet [id number]  TAKE ONE TABLET BY MOUTH TWICE DAILY [name], MD  Active   montelukast (SINGULAIR) 10 MG tablet [id number]  TAKE 1 TABLET BY MOUTH EVERY EVENING AT BEDTIME [name], MD  Active   PAZEO 0.7 % Bailey Mech [id number]    Patient not taking: Reported on 07/21/2020   [provider]  Active   prazosin (MINIPRESS) 1 MG capsule [id number]  Take 6 mg by mouth at bedtime. [provider]  Active Self  predniSONE (DELTASONE) 10 MG tablet [id number]  Take 1 tablet (10 mg total) by mouth daily with breakfast. [name], MD  Active   sodium chloride (OCEAN) 0.65 % SOLN nasal spray [id number]  Place 1 spray into both nostrils as needed for congestion. [name], MD  Active   SYNTHROID 200 MCG tablet [id number]  175 mcg. [provider]  Active Self  topiramate (TOPAMAX) 200 MG tablet [id number]  TAKE 1 TABLET (200 MG TOTAL) BY MOUTH AT BEDTIME. [name], DO  Active   triamcinolone cream (KENALOG) 0.1 % [id number]  Apply 1 application topically 2 (two) times daily. [name], MD  Active   TRULICITY 7.62 GB/1.5VV Bon[facility] Aid [id number]  Inject 1.5 mg into the skin. [provider]  Active Self            Patient Active Problem List   Diagnosis Date Noted   At high risk for falls 04/20/2020   Hyperlipidemia 02/16/2020   Adrenal mass, right ([place]) 04/29/2019   Chronic cough 03/12/2018   Left shoulder pain 10/14/2016   Hypertension 10/14/2016   S/P thyroidectomy 06/27/2016   Chronic bilateral low back pain without sciatica 04/22/2016   Liver lesion 12/24/2015   Controlled type 2 diabetes mellitus with complication, with long-term current use of insulin ([place]) 10/10/2015   Vision loss 08/25/2015   Decreased vision in both eyes 08/25/2015   Prolonged Q-T interval on ECG 08/19/2015   History of pulmonary embolism 08/10/2015   Mild  persistent asthma 04/13/2015   Fibroid, uterine    Morbid obesity ([place]) 11/11/2014   Atypical chest pain 11/08/2014   Sinus tachycardia 11/08/2014   Migraine 10/14/2014   Menorrhagia 09/08/2014    Conditions to be addressed/monitored per PCP order:   chronic healthcare management needs, DM2, asthma, chronic pain, PTSD/anxiety, HTN, mugraines.  Care Plan : General Plan of Care (Adult)  Updates made by Gayla Medicus, RN since 01/17/2021 12:00 AM     Problem: Health Promotion or Disease Self-Management (General Plan of Care)   Priority: Medium  Onset Date: 10/20/2020     Long-Range Goal: Self-Management Plan Developed   Start Date: 07/14/2020  Expected End Date: 03/09/2021  Recent Progress: On track  Priority: Medium  Note:    Current Barriers:  Chronic Disease Management support and education needs  Patient with increased anxiety and depression-has appointment with Dr. [name]  scheduled every 3 months-psychotherapy recommended. Update 12/07/20:  Referral made to [facility] for therapy services. Update 01/17/21:  Patient has appointment scheduled for therapy services. Patient complaining of nose bleeds and productive cough with green sputum. Patient has appointment with ENT 09/22/20 at 130. Update 09/19/20:  Patient states nose bleeds continue daily-? Increase.  Cough with clear sputum now. Update 10/20/20:  Patient states nose bleeds have improved after seeing Dr. Benjamine Mola, no cough. Patient with increased uterine bleeding-changing pad every 1-2 hours-h/o uterine fibroids.  To [facility] at [facility]. Update 09/19/20:  Patient states uterine bleeding has stopped.  Patient still needs to schedule appointment with GYN. Update 10/20/20:  No bleeding reported-has appointment with Dr. Wynetta Emery for follow up. Patient states she fell out of bed and now having shoulder and neck pain-to see MD today, 09/11/20. Update 09/19/20:  Patient states shoulder and neck pain have  resolved and did not need to see MD-c/o left knee pain-using ice which is helping. Update 10/20/20:  Some knee pain continues-to see provider as needed. Update 12/07/20:  Patient states that shoulder, neck, and knee pain are not a problem right now-does have some back pain and is using heating pad with help.  Nurse Case Manager Clinical Goal(s):  Over the next 90 days, patient will attend all scheduled medical appointments:  Update 09/11/20:  Patient is attending all appointments. Over the next 30 days, patient will work with CM team pharmacist to review medications. Update 08/14/20:  patient met with CM team pharmacist 07/21/20 and is following.  Interventions:  Inter-disciplinary care team collaboration (see longitudinal plan of care) Evaluation of current treatment plan  and patient's adherence to plan as established by provider. Reviewed medications with patient. Collaborated with pharmacy regarding medications. Discussed plans with patient for ongoing care management follow up and provided patient with direct contact information for care management team Reviewed scheduled/upcoming provider appointments. Collaborated with SW for psychotherapy referral. SW referral for psychotherapy provider. Pharmacy referral for medication review. Update 08/14/20:  Encouraged patient to call PCP for evaluation of nose bleeds and productive cough. Update 09/11/20:  Patient evaluated by PCP 08/21/20, has appointment with ENT scheduled 09/22/20 at 130.  Patient to also schedule appointment with GYN for uterine bleeding and see MD for shoulder and neck pain. Update 09/19/20:  Shoulder and neck pain improved-doesn't feel like she needs to see MD.  Still needs to schedule appointment with GYN. Update 10/20/20:  Patient has an appointment with Dr. Wynetta Emery for GYN evaluation. Update 12/07/20:  Referral has been made to [facility] for therapy services-patient awaiting appointment. Update 01/17/21:  Patient states she has  appointment for therapy.  Self Care Activities: Over the next 90 days, patient will:  -Self administers medications as prescribed Attends all scheduled provider appointments Calls pharmacy for medication refills Calls provider office for new concerns or questions  Follow Up Plan: The Managed Medicaid care management team will reach out to the patient again over the next 30 days.  The patient has been provided with contact information for the Managed Medicaid care management team and has been advised to call with any health related questions or concerns.   Evidence-based guidance:  Review biopsychosocial determinants of health screens.  Determine level of modifiable health risk.  Assess level of patient activation, level of readiness, importance and confidence to make changes.  Evoke change talk using open-ended questions, pros and cons, as well as looking forward.  Identify areas where behavior change may lead to improved  health.  Partner with patient to develop a robust self-management plan that includes lifestyle factors, such as weight loss, exercise and healthy nutrition, as well as goals specific to disease risks.  Support patient and family/caregiver active participation in decision-making and self-management plan.  Implement additional goals and interventions based on identified risk factors to reduce health risk.  Facilitate advance care planning.  Review need for preventive screening based on age, sex, family history and health history.       Follow Up:  Patient agrees to Care Plan and Follow-up.  Plan: The Managed Medicaid care management team will reach out to the patient again over the next 30 days. and The patient has been provided with contact information for the Managed Medicaid care management team and has been advised to call with any health related questions or concerns.  Date/time of next scheduled RN care management/care coordination outreach:  02/14/21 at 1245.

## 2021-01-31 ENCOUNTER — Other Ambulatory Visit: Payer: Self-pay | Admitting: Internal Medicine

## 2021-01-31 DIAGNOSIS — S46011D Strain of muscle(s) and tendon(s) of the rotator cuff of right shoulder, subsequent encounter: Secondary | ICD-10-CM

## 2021-01-31 NOTE — Telephone Encounter (Signed)
Requested medication (s) are due for refill today: Yes  Requested medication (s) are on the active medication list: Yes  Last refill:  11/18/20  Future visit scheduled: Yes  Notes to clinic: See request.    Requested Prescriptions  Pending Prescriptions Disp Refills   cyclobenzaprine (FLEXERIL) 10 MG tablet [Pharmacy Med Name: CYCLOBENZAPRINE HCL 10 MG ORAL TABLET] 30 tablet 2    Sig: Take 1 tablet (10 mg total) by mouth 3 (three) times daily as needed for muscle spasms.      Not Delegated - Analgesics:  Muscle Relaxants Failed - 01/31/2021 12:31 PM      Failed - This refill cannot be delegated      Passed - Valid encounter within last 6 months    Recent Outpatient Visits           1 month ago Type 2 diabetes mellitus with morbid obesity ([place])   [place], Neoma Laming B, MD   5 months ago Type 2 diabetes mellitus with peripheral neuropathy [facility])   [place] [name] B, MD   9 months ago Class 3 severe obesity due to excess calories with serious comorbidity and body mass index (BMI) of 45.0 to 49.9 in adult [facility])   [place] [name] B, MD   1 year ago Controlled type 2 diabetes mellitus with complication, with long-term current use of insulin [facility])   [place], [place], [name]   1 year ago Controlled type 2 diabetes mellitus with complication, without long-term current use of insulin [facility])   [place], MD       Future Appointments             In 2 months Wynetta Emery, Dalbert Batman, MD [place]

## 2021-02-14 ENCOUNTER — Other Ambulatory Visit: Payer: Self-pay | Admitting: Obstetrics and Gynecology

## 2021-02-14 ENCOUNTER — Other Ambulatory Visit: Payer: Self-pay

## 2021-02-14 NOTE — Patient Instructions (Signed)
Hi [name], thanks for speaking with me today-have a nice afternoon.  Melinda [name] was given information about Medicaid Managed Care team care coordination services as a part of their [place] Medicaid benefit. [name] verbally consented to engagement with the [facility] Managed Care team.   If you are experiencing a medical emergency, please call 911 or report to your local emergency department or urgent care.   If you have a non-emergency medical problem during routine business hours, please contact your provider's office and ask to speak with a nurse.   For questions related to your [facility], please call: [phone] or visit the homepage here: [email or web address]  If you would like to schedule transportation through your [facility], please call the following number at least 2 days in advance of your appointment: [phone].   Call the [facility] at [phone], at any time, 24 hours a day, 7 days a week. If you are in danger or need immediate medical attention call 911.  If you would like help to quit smoking, call 1-800-QUIT-NOW (715)601-2760) OR Espaol: 1-855-Djelo-Ya (3-762-831-5176) o para ms informacin haga clic aqu or Text READY to 200-400 to register via text  Melinda [name] - following are the goals we discussed in your visit today:   Goals Addressed             This Visit's Progress    Protect My Health       Timeframe:  Long-Range Goal Priority:  Medium Start Date:    07/14/20                         Expected End Date:  ongoing          Follow Up Date:  03/17/21 - schedule appointment for vaccines needed due to my age or health - schedule recommended health tests (blood work, mammogram, colonoscopy, pap test) - schedule and keep appointment for annual check-up  Update 08/14/20:  patient has scheduled appointment with PCP  08/21/20 Update 09/11/20:  patient attended appointment with PCP on 08/21/20.  Has appointment scheduled with ENT 09/22/20 due to nose bleeds.  Encouraged patient to schedule appointment with GYN due to bleeding. Update 09/19/20:  Patient states vaginal bleeding has stopped-still needs to schedule appointment with GYN.  Nosebleeds continue daily? More-has appointment 09/22/20 with ENT. Update 10/20/20:  Nose bleeds have stopped-has follow up appointment with Dr. Benjamine Mola on 10/24/20.  Has appointment with Dr. Wynetta Emery for GYN follow up. Update 11/09/20:  Patient attending all appointments-has blood work scheduled next week.  Appointment with Dr. Wynetta Emery 12/19/20. Update 12/07/20:  No new updates today-referral made to [facility] for therapist. Update 01/17/21:  Patient states she has an appointment for therapy. Update 02/14/21:  Patient has therapist appointment 02/27/21, Awaiting appointment with new Psychiatrist.        The patient verbalized understanding of instructions provided today and declined a print copy of patient instruction materials.   The Managed Medicaid care management team will reach out to the patient again over the next 30 days.  The  Patient                                              has been provided with contact information for the Managed Medicaid care management team and has  been advised to call with any health related questions or concerns.   Aida Raider RN, BSN [place]  [facility] Curator - Managed Medicaid High Risk [phone].    Following is a copy of your plan of care:  Patient Care Plan: General Plan of Care (Adult)     Problem Identified: Health Promotion or Disease Self-Management (General Plan of Care)   Priority: Medium  Onset Date: 10/20/2020     Long-Range Goal: Self-Management Plan Developed   Start Date: 07/14/2020  Expected End Date: 05/17/2021  Recent Progress: On track  Priority: Medium  Note:    Current Barriers:   Chronic Disease Management support and education needs  Patient with increased anxiety and depression-has appointment with Psychiatrist  scheduled every 3 months-psychotherapy appointment scheduled.  Trouble sleeping. Update 12/07/20:  Referral made to [facility] for therapy services. Update 01/17/21:  Patient has appointment scheduled for therapy services, 02/27/21. Patient complaining of nose bleeds and productive cough with green sputum. Patient has appointment with ENT 09/22/20 at 130. Update 09/19/20:  Patient states nose bleeds continue daily-? Increase.  Cough with clear sputum now. Update 10/20/20:  Patient states nose bleeds have improved after seeing Dr. Benjamine Mola, no cough. Patient with increased uterine bleeding-changing pad every 1-2 hours-h/o uterine fibroids.  To schedule appointment with GYN at [facility]. Update 09/19/20:  Patient states uterine bleeding has stopped.  Patient still needs to schedule appointment with GYN. Update 10/20/20:  No bleeding reported-has appointment with Dr. Wynetta Emery for follow up. Patient states she fell out of bed and now having shoulder and neck pain-to see MD today, 09/11/20. Update 09/19/20:  Patient states shoulder and neck pain have resolved and did not need to see MD-c/o left knee pain-using ice which is helping. Update 10/20/20:  Some knee pain continues-to see provider as needed. Update 12/07/20:  Patient states that shoulder, neck, and knee pain are not a problem right now-does have some back pain and is using heating pad with help.  Nurse Case Manager Clinical Goal(s):  Over the next 90 days, patient will attend all scheduled medical appointments:  Update 09/11/20:  Patient is attending all appointments. Over the next 30 days, patient will work with CM team pharmacist to review medications. Update 08/14/20:  patient met with CM team pharmacist 07/21/20 and is following.  Interventions:  Inter-disciplinary care team collaboration (see  longitudinal plan of care) Evaluation of current treatment plan  and patient's adherence to plan as established by provider. Reviewed medications with patient. Collaborated with pharmacy regarding medications. Discussed plans with patient for ongoing care management follow up and provided patient with direct contact information for care management team Reviewed scheduled/upcoming provider appointments. Update 02/14/21:  patient to discuss insomnia with providers as scheduled. Collaborated with SW for psychotherapy referral. SW referral for psychotherapy provider. Pharmacy referral for medication review. Update 08/14/20:  Encouraged patient to call PCP for evaluation of nose bleeds and productive cough. Update 09/11/20:  Patient evaluated by PCP 08/21/20, has appointment with ENT scheduled 09/22/20 at 130.  Patient to also schedule appointment with GYN for uterine bleeding and see MD for shoulder and neck pain. Update 09/19/20:  Shoulder and neck pain improved-doesn't feel like she needs to see MD.  Still needs to schedule appointment with GYN. Update 10/20/20:  Patient has an appointment with Dr. Wynetta Emery for GYN evaluation. Update 12/07/20:  Referral has been made to [facility] for therapy services-patient awaiting appointment. Update 01/17/21:  Patient states she has appointment for  therapy.  Self Care Activities: Over the next 90 days, patient will:  -Self administers medications as prescribed Attends all scheduled provider appointments Calls pharmacy for medication refills Calls provider office for new concerns or questions  Follow Up Plan: The Managed Medicaid care management team will reach out to the patient again over the next 30 days.  The patient has been provided with contact information for the Managed Medicaid care management team and has been advised to call with any health related questions or concerns.   Evidence-based guidance:  Review biopsychosocial determinants of health screens.   Determine level of modifiable health risk.  Assess level of patient activation, level of readiness, importance and confidence to make changes.  Evoke change talk using open-ended questions, pros and cons, as well as looking forward.  Identify areas where behavior change may lead to improved health.  Partner with patient to develop a robust self-management plan that includes lifestyle factors, such as weight loss, exercise and healthy nutrition, as well as goals specific to disease risks.  Support patient and family/caregiver active participation in decision-making and self-management plan.  Implement additional goals and interventions based on identified risk factors to reduce health risk.  Facilitate advance care planning.  Review need for preventive screening based on age, sex, family history and health history.

## 2021-02-14 NOTE — Patient Outreach (Signed)
Medicaid Managed Care   Nurse Care Manager Note  02/14/2021 Name:  Melinda [name] MRN:  [id number] DOB:  04/11/1966  Melinda [name] is an 55 y.o. year old female who is a primary patient of [name], MD.  The [facility] Managed Care Coordination team was consulted for assistance with:    Chronic healthcare management needs.  Melinda [name] was given information about Medicaid Managed Care Coordination team services today. [name] Patient agreed to services and verbal consent obtained.  Engaged with patient by telephone for follow up visit in response to provider referral for case management and/or care coordination services.   Assessments/Interventions:  Review of past medical history, allergies, medications, health status, including review of consultants reports, laboratory and other test data, was performed as part of comprehensive evaluation and provision of chronic care management services.  SDOH (Social Determinants of Health) assessments and interventions performed:   Care Plan  Allergies  Allergen Reactions   Ceftriaxone Anaphylaxis    ROCEPHIN   Penicillins Shortness Of Breath    Has patient had a PCN reaction causing immediate rash, facial/tongue/throat swelling, SOB or lightheadedness with hypotension: Yes Has patient had a PCN reaction causing severe rash involving mucus membranes or skin necrosis: No Has patient had a PCN reaction that required hospitalization No Has patient had a PCN reaction occurring within the last 10 years: No If all of the above answers are "NO", then may proceed with Cephalosporin use.    Shellfish Allergy Anaphylaxis   Flonase [Fluticasone Propionate]     Makes migraines worse   Gold-Containing Drug Products     HANDS ITCH   Nickel     HANDS SWELL   Citrus Rash    Medications Reviewed Today     Reviewed by Gayla Medicus, RN (Registered Nurse) on 02/14/21 at 1311  Med List Status: <None>   Medication Order Taking? Sig Documenting  Provider Last Dose Status Informant  albuterol (PROVENTIL) (2.5 MG/3ML) 0.083% nebulizer solution [id number] No Take 3 mLs (2.5 mg total) by nebulization every 6 (six) hours as needed for wheezing or shortness of breath. [name], MD Taking Active   albuterol (VENTOLIN HFA) 108 (90 Base) MCG/ACT inhaler [id number] No Inhale 2 puffs into the lungs every 6 (six) hours as needed for wheezing or shortness of breath. [name], MD Taking Active   atorvastatin (LIPITOR) 20 MG tablet [id number]  TAKE ONE TABLET BY MOUTH ONCE DAILY AT BEDTIME [name], MD  Active   budesonide-formoterol [facility]) 160-4.5 MCG/ACT inhaler [id number]  Inhale 2 puffs into the lungs 2 (two) times daily. [name], MD  Active   buPROPion (WELLBUTRIN XL) 300 MG 24 hr tablet [id number] No Take 300 mg by mouth daily.  [provider] Taking Active Self  busPIRone (BUSPAR) 5 MG tablet [id number] No Take 5 mg by mouth 3 (three) times daily. [provider] Taking Active Self           Med Note [name], [facility] I   Wed Feb 18, 2018 10:03 PM)    Cholecalciferol (VITAMIN D3) 10 MCG (400 UNIT) tablet [id number]  Take 1 tablet (400 Units total) by mouth daily. [name], MD  Active   citalopram (CELEXA) 40 MG tablet [id number] No Take 40 mg by mouth daily.  [provider] Taking Active Self  cyclobenzaprine (FLEXERIL) 10 MG tablet [id number]  TAKE 1 TABLET (10 MG TOTAL) BY MOUTH 3 (THREE) TIMES DAILY AS NEEDED FOR MUSCLE SPASMS. [name]  B, MD  Active   doxepin (SINEQUAN) 10 MG capsule [id number] No Take 10 mg by mouth. [provider] Taking Active   ELIQUIS 5 MG TABS tablet [id number]  TAKE ONE TABLET BY MOUTH TWICE DAILY MORNING AND EVENING [name], MD  Active   gabapentin (NEURONTIN) 300 MG capsule [id number]  Take 1 capsule (300 mg total) by mouth 3 (three) times daily. [name], MD  Active   hydrochlorothiazide (MICROZIDE) 12.5  MG capsule [id number]  TAKE ONE CAPSULE BY MOUTH ONCE DAILY(AM) [name], MD  Active   metoprolol tartrate (LOPRESSOR) 50 MG tablet [id number]  TAKE ONE TABLET BY MOUTH TWICE DAILY [name], MD  Active   montelukast (SINGULAIR) 10 MG tablet [id number]  TAKE 1 TABLET BY MOUTH EVERY EVENING AT BEDTIME [name], MD  Active   PAZEO 0.7 % SOLN [id number] No   Patient not taking: Reported on 07/21/2020   [provider] Not Taking Active   prazosin (MINIPRESS) 1 MG capsule [id number] No Take 6 mg by mouth at bedtime. [provider] Taking Active Self  predniSONE (DELTASONE) 10 MG tablet [id number]  Take 1 tablet (10 mg total) by mouth daily with breakfast. [name], MD  Active   sodium chloride (OCEAN) 0.65 % SOLN nasal spray [id number]  Place 1 spray into both nostrils as needed for congestion. [name], MD  Active   SYNTHROID 200 MCG tablet [id number] No 175 mcg. [provider] Taking Active Self  topiramate (TOPAMAX) 200 MG tablet [id number] No TAKE 1 TABLET (200 MG TOTAL) BY MOUTH AT BEDTIME. [name], DO Taking Active   triamcinolone cream (KENALOG) 0.1 % [id number]  Apply 1 application topically 2 (two) times daily. [name], MD  Active   TRULICITY 9.62 EZ/6.6QH Bonney Aid [id number] No Inject 1.5 mg into the skin. [provider] Taking Active Self            Patient Active Problem List   Diagnosis Date Noted   At high risk for falls 04/20/2020   Hyperlipidemia 02/16/2020   Adrenal mass, right ([place]) 04/29/2019   Chronic cough 03/12/2018   Left shoulder pain 10/14/2016   Hypertension 10/14/2016   S/P thyroidectomy 06/27/2016   Chronic bilateral low back pain without sciatica 04/22/2016   Liver lesion 12/24/2015   Controlled type 2 diabetes mellitus with complication, with long-term current use of insulin ([place]) 10/10/2015   Vision loss 08/25/2015   Decreased vision in both eyes 08/25/2015   Prolonged  Q-T interval on ECG 08/19/2015   History of pulmonary embolism 08/10/2015   Mild persistent asthma 04/13/2015   Fibroid, uterine    Morbid obesity ([place]) 11/11/2014   Atypical chest pain 11/08/2014   Sinus tachycardia 11/08/2014   Migraine 10/14/2014   Menorrhagia 09/08/2014    Conditions to be addressed/monitored per PCP order:   chronic healthcare management needs, migraines, DM2, HTN, chronic pain, asthma, anxiety, HLD.  Care Plan : General Plan of Care (Adult)  Updates made by Gayla Medicus, RN since 02/14/2021 12:00 AM     Problem: Health Promotion or Disease Self-Management (General Plan of Care)   Priority: Medium  Onset Date: 10/20/2020     Long-Range Goal: Self-Management Plan Developed   Start Date: 07/14/2020  Expected End Date: 05/17/2021  Recent Progress: On track  Priority: Medium  Note:    Current Barriers:  Chronic Disease Management support and education needs  Patient with increased anxiety and depression-has appointment  with Psychiatrist  scheduled every 3 months-psychotherapy appointment scheduled.  Trouble sleeping. Update 12/07/20:  Referral made to [facility] for therapy services. Update 01/17/21:  Patient has appointment scheduled for therapy services, 02/27/21. Patient complaining of nose bleeds and productive cough with green sputum. Patient has appointment with ENT 09/22/20 at 130. Update 09/19/20:  Patient states nose bleeds continue daily-? Increase.  Cough with clear sputum now. Update 10/20/20:  Patient states nose bleeds have improved after seeing Dr. Benjamine Mola, no cough. Patient with increased uterine bleeding-changing pad every 1-2 hours-h/o uterine fibroids.  To [facility]. Update 09/19/20:  Patient states uterine bleeding has stopped.  Patient still needs to schedule appointment with GYN. Update 10/20/20:  No bleeding reported-has appointment with Dr. Wynetta Emery for follow up. Patient states she fell out of bed  and now having shoulder and neck pain-to see MD today, 09/11/20. Update 09/19/20:  Patient states shoulder and neck pain have resolved and did not need to see MD-c/o left knee pain-using ice which is helping. Update 10/20/20:  Some knee pain continues-to see provider as needed. Update 12/07/20:  Patient states that shoulder, neck, and knee pain are not a problem right now-does have some back pain and is using heating pad with help.  Nurse Case Manager Clinical Goal(s):  Over the next 90 days, patient will attend all scheduled medical appointments:  Update 09/11/20:  Patient is attending all appointments. Over the next 30 days, patient will work with CM team pharmacist to review medications. Update 08/14/20:  patient met with CM team pharmacist 07/21/20 and is following.  Interventions:  Inter-disciplinary care team collaboration (see longitudinal plan of care) Evaluation of current treatment plan  and patient's adherence to plan as established by provider. Reviewed medications with patient. Collaborated with pharmacy regarding medications. Discussed plans with patient for ongoing care management follow up and provided patient with direct contact information for care management team Reviewed scheduled/upcoming provider appointments. Update 02/14/21:  patient to discuss insomnia with providers as scheduled. Collaborated with SW for psychotherapy referral. SW referral for psychotherapy provider. Pharmacy referral for medication review. Update 08/14/20:  Encouraged patient to call PCP for evaluation of nose bleeds and productive cough. Update 09/11/20:  Patient evaluated by PCP 08/21/20, has appointment with ENT scheduled 09/22/20 at 130.  Patient to also schedule appointment with GYN for uterine bleeding and see MD for shoulder and neck pain. Update 09/19/20:  Shoulder and neck pain improved-doesn't feel like she needs to see MD.  Still needs to schedule appointment with GYN. Update 10/20/20:  Patient has an  appointment with Dr. Wynetta Emery for GYN evaluation. Update 12/07/20:  Referral has been made to [facility] for therapy services-patient awaiting appointment. Update 01/17/21:  Patient states she has appointment for therapy.  Self Care Activities: Over the next 90 days, patient will:  -Self administers medications as prescribed Attends all scheduled provider appointments Calls pharmacy for medication refills Calls provider office for new concerns or questions  Follow Up Plan: The Managed Medicaid care management team will reach out to the patient again over the next 30 days.  The patient has been provided with contact information for the Managed Medicaid care management team and has been advised to call with any health related questions or concerns.   Evidence-based guidance:  Review biopsychosocial determinants of health screens.  Determine level of modifiable health risk.  Assess level of patient activation, level of readiness, importance and confidence to make changes.  Evoke change talk using open-ended  questions, pros and cons, as well as looking forward.  Identify areas where behavior change may lead to improved health.  Partner with patient to develop a robust self-management plan that includes lifestyle factors, such as weight loss, exercise and healthy nutrition, as well as goals specific to disease risks.  Support patient and family/caregiver active participation in decision-making and self-management plan.  Implement additional goals and interventions based on identified risk factors to reduce health risk.  Facilitate advance care planning.  Review need for preventive screening based on age, sex, family history and health history.       Follow Up:  Patient agrees to Care Plan and Follow-up.  Plan: The Managed Medicaid care management team will reach out to the patient again over the next 30 days. and The patient has been provided with contact information for the Managed Medicaid care  management team and has been advised to call with any health related questions or concerns.  Date/time of next scheduled RN care management/care coordination outreach:  03/14/21 at 1245

## 2021-02-15 ENCOUNTER — Other Ambulatory Visit: Payer: Self-pay

## 2021-02-15 ENCOUNTER — Other Ambulatory Visit: Payer: Self-pay | Admitting: Obstetrics and Gynecology

## 2021-02-15 DIAGNOSIS — F411 Generalized anxiety disorder: Secondary | ICD-10-CM | POA: Diagnosis not present

## 2021-02-15 DIAGNOSIS — F4310 Post-traumatic stress disorder, unspecified: Secondary | ICD-10-CM | POA: Diagnosis not present

## 2021-02-15 DIAGNOSIS — F331 Major depressive disorder, recurrent, moderate: Secondary | ICD-10-CM | POA: Diagnosis not present

## 2021-02-15 NOTE — Patient Outreach (Signed)
Care Coordination  02/15/2021  Trenita Rivere 1966-05-23 [id number]  RNCM returned patient's phone call-patient with questions regarding CPAP, sleep study.  Patient believes she saw a Pulmonologist about 3 years ago, but do not see in Epic. Patient will call her PCP.  Aida Raider RN, BSN [place]  [facility] Curator - Managed Medicaid High Risk [phone].

## 2021-02-16 ENCOUNTER — Other Ambulatory Visit: Payer: Self-pay

## 2021-02-16 ENCOUNTER — Telehealth: Payer: Self-pay | Admitting: Internal Medicine

## 2021-02-16 NOTE — Patient Instructions (Signed)
Visit Information  Ms. Ehrman was given information about Medicaid Managed Care team care coordination services as a part of their [place] Medicaid benefit. [name] verbally consented to engagement with the [facility] Managed Care team.   If you are experiencing a medical emergency, please call 911 or report to your local emergency department or urgent care.   If you have a non-emergency medical problem during routine business hours, please contact your provider's office and ask to speak with a nurse.   For questions related to your [facility], please call: [phone] or visit the homepage here: [email or web address]  If you would like to schedule transportation through your [facility], please call the following number at least 2 days in advance of your appointment: [phone].   Call the [place] at [phone], at any time, 24 hours a day, 7 days a week. If you are in danger or need immediate medical attention call 911.  If you would like help to quit smoking, call 1-800-QUIT-NOW 305 361 4251) OR Espaol: 1-855-Djelo-Ya (3-559-741-6384) o para ms informacin haga clic aqu or Text READY to 200-400 to register via text  Ms. Sweetland - following are the goals we discussed in your visit today:   Goals Addressed   None     Please see education materials related to DM provided as print materials.   The patient verbalized understanding of instructions provided today and agreed to receive a mailed copy of patient instruction and/or educational materials.  The Managed Medicaid care management team will reach out to the patient again over the next 120 days.   Arizona Constable, Pharm.D., Managed Medicaid Pharmacist [phone]   Following is a copy of your plan of care:  Patient Care Plan: General Plan of Care (Adult)     Problem  Identified: Health Promotion or Disease Self-Management (General Plan of Care)   Priority: Medium  Onset Date: 07/14/2020     Patient Care Plan: General Plan of Care (Adult)     Problem Identified: Health Promotion or Disease Self-Management (General Plan of Care)   Priority: Medium  Onset Date: 10/20/2020     Long-Range Goal: Self-Management Plan Developed   Start Date: 07/14/2020  Expected End Date: 05/17/2021  Recent Progress: On track  Priority: Medium  Note:    Current Barriers:  Chronic Disease Management support and education needs  Patient with increased anxiety and depression-has appointment with Psychiatrist  scheduled every 3 months-psychotherapy appointment scheduled.  Trouble sleeping. Update 12/07/20:  Referral made to [place] for therapy services. Update 01/17/21:  Patient has appointment scheduled for therapy services, 02/27/21. Patient complaining of nose bleeds and productive cough with green sputum. Patient has appointment with ENT 09/22/20 at 130. Update 09/19/20:  Patient states nose bleeds continue daily-? Increase.  Cough with clear sputum now. Update 10/20/20:  Patient states nose bleeds have improved after seeing Dr. Benjamine Mola, no cough. Patient with increased uterine bleeding-changing pad every 1-2 hours-h/o uterine fibroids.  To schedule appointment with GYN at [facility]. Update 09/19/20:  Patient states uterine bleeding has stopped.  Patient still needs to schedule appointment with GYN. Update 10/20/20:  No bleeding reported-has appointment with Dr. Wynetta Emery for follow up. Patient states she fell out of bed and now having shoulder and neck pain-to see MD today, 09/11/20. Update 09/19/20:  Patient states shoulder and neck pain have resolved and did not need to see MD-c/o left knee pain-using ice which is helping. Update  10/20/20:  Some knee pain continues-to see provider as needed. Update 12/07/20:  Patient states that shoulder, neck, and knee pain are not a  problem right now-does have some back pain and is using heating pad with help.  Nurse Case Manager Clinical Goal(s):  Over the next 90 days, patient will attend all scheduled medical appointments:  Update 09/11/20:  Patient is attending all appointments. Over the next 30 days, patient will work with CM team pharmacist to review medications. Update 08/14/20:  patient met with CM team pharmacist 07/21/20 and is following.  Interventions:  Inter-disciplinary care team collaboration (see longitudinal plan of care) Evaluation of current treatment plan  and patient's adherence to plan as established by provider. Reviewed medications with patient. Collaborated with pharmacy regarding medications. Discussed plans with patient for ongoing care management follow up and provided patient with direct contact information for care management team Reviewed scheduled/upcoming provider appointments. Update 02/14/21:  patient to discuss insomnia with providers as scheduled. Collaborated with SW for psychotherapy referral. SW referral for psychotherapy provider. Pharmacy referral for medication review. Update 08/14/20:  Encouraged patient to call PCP for evaluation of nose bleeds and productive cough. Update 09/11/20:  Patient evaluated by PCP 08/21/20, has appointment with ENT scheduled 09/22/20 at 130.  Patient to also schedule appointment with GYN for uterine bleeding and see MD for shoulder and neck pain. Update 09/19/20:  Shoulder and neck pain improved-doesn't feel like she needs to see MD.  Still needs to schedule appointment with GYN. Update 10/20/20:  Patient has an appointment with Dr. Wynetta Emery for GYN evaluation. Update 12/07/20:  Referral has been made to [facility] for therapy services-patient awaiting appointment. Update 01/17/21:  Patient states she has appointment for therapy.  Self Care Activities: Over the next 90 days, patient will:  -Self administers medications as prescribed Attends all scheduled  provider appointments Calls pharmacy for medication refills Calls provider office for new concerns or questions  Follow Up Plan: The Managed Medicaid care management team will reach out to the patient again over the next 30 days.  The patient has been provided with contact information for the Managed Medicaid care management team and has been advised to call with any health related questions or concerns.   Evidence-based guidance:  Review biopsychosocial determinants of health screens.  Determine level of modifiable health risk.  Assess level of patient activation, level of readiness, importance and confidence to make changes.  Evoke change talk using open-ended questions, pros and cons, as well as looking forward.  Identify areas where behavior change may lead to improved health.  Partner with patient to develop a robust self-management plan that includes lifestyle factors, such as weight loss, exercise and healthy nutrition, as well as goals specific to disease risks.  Support patient and family/caregiver active participation in decision-making and self-management plan.  Implement additional goals and interventions based on identified risk factors to reduce health risk.  Facilitate advance care planning.  Review need for preventive screening based on age, sex, family history and health history.      Task: Mutually Develop and Royce Macadamia Achievement of Patient Goals   Note:   Care Management Activities:    - verbalization of feelings encouraged    Notes:     Patient Care Plan: Medication Management     Problem Identified: Health Promotion or Disease Self-Management (General Plan of Care)      Goal: Medication Management   Note:   Current Barriers:  Does not maintain contact with provider office Does not  contact provider office for questions/concerns Gave patient SW Phone number to get f/u ASAP  Pharmacist Clinical Goal(s):  Over the next 180 days, patient will verbalize  ability to afford treatment regimen maintain control of disease states as evidenced by Current regimen  contact provider office for questions/concerns as evidenced notation of same in electronic health record through collaboration with PharmD and provider.    Interventions: Inter-disciplinary care team collaboration (see longitudinal plan of care) Comprehensive medication review performed; medication list updated in electronic medical record  '@RXCPDIABETES' @ '@RXCPHYPERTENSION' @ '@RXCPHYPERLIPIDEMIA' @ '@RXCPCOPD' @ '@RXCPMENTALHEALTH' @  Patient Goals/Self-Care Activities Over the next 180 days, patient will:  - take medications as prescribed collaborate with provider on medication access solutions  Follow Up Plan: The care management team will reach out to the patient again over the next 180 days.      Task: Mutually Develop and Royce Macadamia Achievement of Patient Goals   Note:   Care Management Activities:    - verbalization of feelings encouraged    Notes:

## 2021-02-16 NOTE — Telephone Encounter (Signed)
Copied from [place] [id number]. Topic: General - Inquiry >> Feb 15, 2021 11:59 AM Greggory Keen D wrote: Reason for CRM: Pt called saying she was advised to get  a C Pap machine from her psychiatrist.  She has had a sleep study done a couple of years ago.  CB#  [phone]

## 2021-02-16 NOTE — Patient Outreach (Signed)
Medicaid Managed Care    Pharmacy Note  02/16/2021 Name: Melinda [name] MRN: [id number] DOB: 1966-03-22  Melinda [name] is a 55 y.o. year old female who is a primary care patient of [name], MD. The [facility] Managed Care Coordination team was consulted for assistance with disease management and care coordination needs.    Engaged with patient Engaged with patient by telephone for initial visit in response to referral for case management and/or care coordination services.  Ms. Galluccio was given information about Managed Medicaid Care Coordination team services today. [name] agreed to services and verbal consent obtained.   Objective:  Lab Results  Component Value Date   CREATININE 1.00 12/19/2020   CREATININE 0.85 12/30/2019   CREATININE 1.07 (H) 04/29/2019    Lab Results  Component Value Date   HGBA1C 5.4 08/21/2020       Component Value Date/Time   CHOL 130 04/20/2020 1133   TRIG 95 04/20/2020 1133   HDL 44 04/20/2020 1133   CHOLHDL 3.0 04/20/2020 1133   CHOLHDL 2.7 09/08/2014 0147   VLDL 18 09/08/2014 0147   LDLCALC 68 04/20/2020 1133    Other: (TSH, CBC, Vit D, etc.)  Clinical ASCVD: No  The 10-year ASCVD risk score Mikey Bussing DC Jr., et al., 2013) is: 2.4%   Values used to calculate the score:     Age: 18 years     Sex: Female     Is Non-Hispanic African American: No     Diabetic: Yes     Tobacco smoker: No     Systolic Blood Pressure: 123XX123 mmHg     Is BP treated: Yes     HDL Cholesterol: 44 mg/dL     Total Cholesterol: 130 mg/dL    Other: (CHADS2VASc if Afib, PHQ9 if depression, MMRC or CAT for COPD, ACT, DEXA)  BP Readings from Last 3 Encounters:  12/19/20 117/82  08/21/20 120/80  05/18/20 126/80    Assessment/Interventions: Review of patient past medical history, allergies, medications, health status, including review of consultants reports, laboratory and other test data, was performed as part of comprehensive evaluation and provision of chronic  care management services.    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 HCTZ 12.'5mg'$  Metoprolol '50mg'$  BID Plan: At goal,  patient stable/ symptoms controlled   Lipids Lab Results  Component Value Date   CHOL 130 04/20/2020   CHOL 156 04/29/2019   CHOL 159 09/08/2014   Lab Results  Component Value Date   HDL 44 04/20/2020   HDL 52 04/29/2019   HDL 58 09/08/2014   Lab Results  Component Value Date   LDLCALC 68 04/20/2020   LDLCALC 82 04/29/2019   LDLCALC 83 09/08/2014   Lab Results  Component Value Date   TRIG 95 04/20/2020   TRIG 125 04/29/2019   TRIG 89 09/08/2014   Lab Results  Component Value Date   CHOLHDL 3.0 04/20/2020   CHOLHDL 3.0 04/29/2019   CHOLHDL 2.7 09/08/2014   No results found for: LDLDIRECT  Atorvastatin '20mg'$  -Candidate for max dose statin but levels are well at goal, will defer to PCP Plan: At goal,  patient stable/ symptoms controlled   COPD Symbicort Albuterol (Uses at night to make sure not coughing, no trouble getting to sleep) Monteleukast Plan: At goal,  patient stable/ symptoms controlled   Thyroid Lab Results  Component Value Date   TSH 0.867 05/06/2017    Thryoid removed Synthroid 285mg Plan: At goal,  patient stable/ symptoms controlled  Migraines -Doing well per patient, no current issues Topirimate '200mg'$  Plan: At goal,  patient stable/ symptoms controlled   DM Lab Results  Component Value Date   HGBA1C 5.4 08/21/2020   HGBA1C 6.3 (H) 04/20/2020   HGBA1C 6.0 (A) 01/19/2020   Lab Results  Component Value Date   MICROALBUR 2.1 08/25/2015   LDLCALC 68 04/20/2020   CREATININE 1.00 12/19/2020    Lab Results  Component Value Date   NA 142 12/19/2020   K 3.6 12/19/2020   CREATININE 1.00 12/19/2020   GFRNONAA >60 12/30/2019   GFRAA >60 12/30/2019   GLUCOSE 113 (H) [id number]    Trulicity Tried/Failed: Metformin (Stomach) Plan: At goal,  patient stable/ symptoms  controlled    Mood Depression screen [facility] 2/9 12/19/2020 04/20/2020 01/19/2020  Decreased Interest '1 1 1  '$ Down, Depressed, Hopeless 1 1 0  PHQ - 2 Score '2 2 1  '$ Altered sleeping 3 0 0  Tired, decreased energy 2 0 3  Change in appetite 2 0 2  Feeling bad or failure about yourself  0 0 0  Trouble concentrating '3 3 1  '$ Moving slowly or fidgety/restless 0 3 1  Suicidal thoughts 0 0 0  PHQ-9 Score '12 8 8  '$ Difficult doing work/chores - - Not difficult at all  Some recent data might be hidden   Bupropion '300mg'$  Buspirone '5mg'$  Citalopram '40mg'$  Doxepin (Also for sleep) Prazosin '5mg'$  (Nightmares) Jan 2022: Patient content on therapy. Before meds, "Hard time dealing with loud noises and hates crowds and 'worries'" but now it's a lot less May 2022: Patient stated her "Anxiety and Depression are acting back up." Was seen by [facility] group a year ago but they can't fit her in for 2-3 months. Had Phone Visit with Ubaldo Glassing this week but patient slept through it. Gave her Ubaldo Glassing' number to call back again August 2022: Patient states she's having insomnia. Had appt with new psych last week but missed it due to transport picking her up late. Rescheduled for next week. Encouraged sleep hygiene (She sleeps with TV on everynight).   Pain Before meds: 10/10   After: Dropped to reasonable (4/10) Gabapentin '300mg'$  Cyclobenzaprine August 2022: Patient fell in June but hasn't told anyone. Will send msg to Dr. Wynetta Emery to see if she can see patient after nurse visit monday     SDOH (Social Determinants of Health) assessments and interventions performed:    Care Plan  Allergies  Allergen Reactions   Ceftriaxone Anaphylaxis    ROCEPHIN   Penicillins Shortness Of Breath    Has patient had a PCN reaction causing immediate rash, facial/tongue/throat swelling, SOB or lightheadedness with hypotension: Yes Has patient had a PCN reaction causing severe rash involving mucus membranes or skin necrosis: No Has patient had a  PCN reaction that required hospitalization No Has patient had a PCN reaction occurring within the last 10 years: No If all of the above answers are "NO", then may proceed with Cephalosporin use.    Shellfish Allergy Anaphylaxis   Flonase [Fluticasone Propionate]     Makes migraines worse   Gold-Containing Drug Products     HANDS ITCH   Nickel     HANDS SWELL   Citrus Rash    Medications Reviewed Today     Reviewed by Gayla Medicus, RN (Registered Nurse) on 02/14/21 at 1311  Med List Status: <None>   Medication Order Taking? Sig Documenting Provider Last Dose Status Informant  albuterol (PROVENTIL) (2.5 MG/3ML) 0.083% nebulizer solution [id number] No  Take 3 mLs (2.5 mg total) by nebulization every 6 (six) hours as needed for wheezing or shortness of breath. [name], MD Taking Active   albuterol (VENTOLIN HFA) 108 (90 Base) MCG/ACT inhaler [id number] No Inhale 2 puffs into the lungs every 6 (six) hours as needed for wheezing or shortness of breath. [name], MD Taking Active   atorvastatin (LIPITOR) 20 MG tablet [id number]  TAKE ONE TABLET BY MOUTH ONCE DAILY AT BEDTIME [name], MD  Active   budesonide-formoterol [facility]) 160-4.5 MCG/ACT inhaler [id number]  Inhale 2 puffs into the lungs 2 (two) times daily. [name], MD  Active   buPROPion (WELLBUTRIN XL) 300 MG 24 hr tablet [id number] No Take 300 mg by mouth daily.  [provider] Taking Active Self  busPIRone (BUSPAR) 5 MG tablet [id number] No Take 5 mg by mouth 3 (three) times daily. [provider] Taking Active Self           Med Note [name], [facility]   Wed Feb 18, 2018 10:03 PM)    Cholecalciferol (VITAMIN D3) 10 MCG (400 UNIT) tablet [id number]  Take 1 tablet (400 Units total) by mouth daily. [name], MD  Active   citalopram (CELEXA) 40 MG tablet [id number] No Take 40 mg by mouth daily.  [provider] Taking Active Self  cyclobenzaprine (FLEXERIL)  10 MG tablet [id number]  TAKE 1 TABLET (10 MG TOTAL) BY MOUTH 3 (THREE) TIMES DAILY AS NEEDED FOR MUSCLE SPASMS. [name], MD  Active   doxepin (SINEQUAN) 10 MG capsule [id number] No Take 10 mg by mouth. [provider] Taking Active   ELIQUIS 5 MG TABS tablet [id number]  TAKE ONE TABLET BY MOUTH TWICE DAILY MORNING AND EVENING [name], MD  Active   gabapentin (NEURONTIN) 300 MG capsule [id number]  Take 1 capsule (300 mg total) by mouth 3 (three) times daily. [name], MD  Active   hydrochlorothiazide (MICROZIDE) 12.5 MG capsule [id number]  TAKE ONE CAPSULE BY MOUTH ONCE DAILY(AM) [name], MD  Active   metoprolol tartrate (LOPRESSOR) 50 MG tablet [id number]  TAKE ONE TABLET BY MOUTH TWICE DAILY [name], MD  Active   montelukast (SINGULAIR) 10 MG tablet [id number]  TAKE 1 TABLET BY MOUTH EVERY EVENING AT BEDTIME [name], MD  Active   PAZEO 0.7 % SOLN [id number] No   Patient not taking: Reported on 07/21/2020   [provider] Not Taking Active   prazosin (MINIPRESS) 1 MG capsule [id number] No Take 6 mg by mouth at bedtime. [provider] Taking Active Self  predniSONE (DELTASONE) 10 MG tablet [id number]  Take 1 tablet (10 mg total) by mouth daily with breakfast. [name], MD  Active   sodium chloride (OCEAN) 0.65 % SOLN nasal spray [id number]  Place 1 spray into both nostrils as needed for congestion. [name], MD  Active   SYNTHROID 200 MCG tablet [id number] No 175 mcg. [provider] Taking Active Self  topiramate (TOPAMAX) 200 MG tablet [id number] No TAKE 1 TABLET (200 MG TOTAL) BY MOUTH AT BEDTIME. [name], DO Taking Active   triamcinolone cream (KENALOG) 0.1 % [id number]  Apply 1 application topically 2 (two) times daily. [name], MD  Active   TRULICITY [id number] 0000000 Bonney Aid [id number] No Inject 1.5 mg into the skin. [provider] Taking Active Self             Patient Active  Problem List   Diagnosis Date Noted   At high risk for falls 04/20/2020   Hyperlipidemia 02/16/2020   Adrenal mass, right ([place]) 04/29/2019   Chronic cough 03/12/2018   Left shoulder pain 10/14/2016   Hypertension 10/14/2016   S/P thyroidectomy 06/27/2016   Chronic bilateral low back pain without sciatica 04/22/2016   Liver lesion 12/24/2015   Controlled type 2 diabetes mellitus with complication, with long-term current use of insulin ([place]) 10/10/2015   Vision loss 08/25/2015   Decreased vision in both eyes 08/25/2015   Prolonged Q-T interval on ECG 08/19/2015   History of pulmonary embolism 08/10/2015   Mild persistent asthma 04/13/2015   Fibroid, uterine    Morbid obesity ([place]) 11/11/2014   Atypical chest pain 11/08/2014   Sinus tachycardia 11/08/2014   Migraine 10/14/2014   Menorrhagia 09/08/2014    Conditions to be addressed/monitored: HTN, Hypertriglyceridemia, COPD, DM, Anxiety and Depression  Care Plan : Medication Management  Updates made by Lane Hacker, [place] since 07/21/2020 12:00 AM     Problem: Health Promotion or Disease Self-Management (General Plan of Care)      Goal: Medication Management   Note:   Current Barriers:  Patient doing great at the moment and has no goals other than to maintain therapy   Pharmacist Clinical Goal(s):  Over the next 180 days, patient will verbalize ability to afford treatment regimen maintain control of disease states as evidenced by Current regimen  contact provider office for questions/concerns as evidenced notation of same in electronic health record through collaboration with PharmD and provider.    Interventions: Inter-disciplinary care team collaboration (see longitudinal plan of care) Comprehensive medication review performed; medication list updated in electronic medical record  '@RXCPDIABETES'$ @ '@RXCPHYPERTENSION'$ @ '@RXCPHYPERLIPIDEMIA'$ @ '@RXCPCOPD'$ @ '@RXCPMENTALHEALTH'$ @  Patient Goals/Self-Care  Activities Over the next 180 days, patient will:  - take medications as prescribed collaborate with provider on medication access solutions  Follow Up Plan: The care management team will reach out to the patient again over the next 180 days.      Task: Mutually Develop and Royce Macadamia Achievement of Patient Goals   Note:   Care Management Activities:    - verbalization of feelings encouraged    Notes:       Medication Assistance: None required. Patient affirms current coverage meets needs.   Follow up: Agree   Plan: The care management team will reach out to the patient again over the next 180 days.   [place] Constable, Pharm.D., Managed Medicaid Pharmacist - [phone]

## 2021-02-16 NOTE — Telephone Encounter (Signed)
Would pt need a new sleep study done?

## 2021-02-19 ENCOUNTER — Other Ambulatory Visit: Payer: Self-pay

## 2021-02-19 ENCOUNTER — Ambulatory Visit (HOSPITAL_BASED_OUTPATIENT_CLINIC_OR_DEPARTMENT_OTHER): Payer: Medicaid Other | Admitting: Internal Medicine

## 2021-02-19 ENCOUNTER — Ambulatory Visit: Payer: Medicaid Other | Attending: Internal Medicine

## 2021-02-19 DIAGNOSIS — Z72820 Sleep deprivation: Secondary | ICD-10-CM

## 2021-02-19 DIAGNOSIS — Z23 Encounter for immunization: Secondary | ICD-10-CM

## 2021-02-19 NOTE — Telephone Encounter (Signed)
Will forward to provider  

## 2021-02-19 NOTE — Progress Notes (Signed)
Patient ID: Melinda [name], female   DOB: August 03, 1965, 55 y.o.   MRN: [id number] Virtual Visit via Telephone Note  I connected with Melinda [name] on 02/19/2021 at 2:59 p.m by telephone and verified that I am speaking with the correct person using two identifiers  Location: Patient: home Provider: office  Participants: Myself Patient   I discussed the limitations, risks, security and privacy concerns of performing an evaluation and management service by telephone and the availability of in person appointments. I also discussed with the patient that there [name] be a patient responsible charge related to this service. The patient expressed understanding and agreed to proceed.   History of Present Illness: Pt with hx of DM (followed by Dr. 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 Last seen 11/2020.  This is an UC visit.  Pt requesting CPAP machine. Reports she was discussing her sleep habits with her psychiatrist.  He thinks she [name] need CPAP because she reports non-restorative sleep despite being on medications.  Pt had sleep study 01/2019 that revealed lond snoring but no sleep apnea.   She feels sleepy during the day but she tries to avoid taking naps.  She does not drive.  She lives alone and is not sure if she snores.   Outpatient Encounter Medications as of 02/19/2021  Medication Sig   Cholecalciferol (VITAMIN D3) 10 MCG (400 UNIT) tablet Take 1 tablet (400 Units total) by mouth daily.   albuterol (PROVENTIL) (2.5 MG/3ML) 0.083% nebulizer solution Take 3 mLs (2.5 mg total) by nebulization every 6 (six) hours as needed for wheezing or shortness of breath.   albuterol (VENTOLIN HFA) 108 (90 Base) MCG/ACT inhaler Inhale 2 puffs into the lungs every 6 (six) hours as needed for wheezing or shortness of breath.    atorvastatin (LIPITOR) 20 MG tablet TAKE ONE TABLET BY MOUTH ONCE DAILY AT BEDTIME   budesonide-formoterol (SYMBICORT) 160-4.5 MCG/ACT inhaler Inhale 2 puffs into the lungs 2 (two) times daily.   buPROPion (WELLBUTRIN XL) 300 MG 24 hr tablet Take 300 mg by mouth daily.    busPIRone (BUSPAR) 5 MG tablet Take 5 mg by mouth 3 (three) times daily.   citalopram (CELEXA) 40 MG tablet Take 40 mg by mouth daily.    cyclobenzaprine (FLEXERIL) 10 MG tablet TAKE 1 TABLET (10 MG TOTAL) BY MOUTH 3 (THREE) TIMES DAILY AS NEEDED FOR MUSCLE SPASMS.   doxepin (SINEQUAN) 10 MG capsule Take 10 mg by mouth.   ELIQUIS 5 MG TABS tablet TAKE ONE TABLET BY MOUTH TWICE DAILY MORNING AND EVENING   gabapentin (NEURONTIN) 300 MG capsule Take 1 capsule (300 mg total) by mouth 3 (three) times daily.   hydrochlorothiazide (MICROZIDE) 12.5 MG capsule TAKE ONE CAPSULE BY MOUTH ONCE DAILY(AM)   metoprolol tartrate (LOPRESSOR) 50 MG tablet TAKE ONE TABLET BY MOUTH TWICE DAILY   montelukast (SINGULAIR) 10 MG tablet TAKE 1 TABLET BY MOUTH EVERY EVENING AT BEDTIME   PAZEO 0.7 % SOLN  (Patient not taking: Reported on 07/21/2020)   prazosin (MINIPRESS) 1 MG capsule Take 6 mg by mouth at bedtime.   predniSONE (DELTASONE) 10 MG tablet Take 1 tablet (10 mg total) by mouth daily with breakfast.   sodium chloride (OCEAN) 0.65 % SOLN nasal spray Place 1 spray into both nostrils as needed for congestion.   SYNTHROID 200 MCG tablet 175 mcg.   topiramate (TOPAMAX) 200  MG tablet TAKE 1 TABLET (200 MG TOTAL) BY MOUTH AT BEDTIME.   triamcinolone cream (KENALOG) 0.1 % Apply 1 application topically 2 (two) times daily.   TRULICITY [id number] SOPN Inject 1.5 mg into the skin.   No facility-administered encounter medications on file as of 02/19/2021.      Observations/Objective: No direct observation done as this was a telephone encounter.  Assessment and Plan: 1. Poor sleep -I told patient that her insurance likely will not pay for her to  get a CPAP given that she has had a sleep study that did not show sleep apnea.  However if she feels her symptoms have progressed, we certainly can repeat the sleep study given that its been 2 years.  Patient declines doing a repeat sleep study at this time. Good sleep hygiene discussed and encouraged. Patient advised not to drink any caffeinated beverages or excessive alcohol use within several hours of bedtime.  Advised to get in bed around about the same time every night.  Once in bed, turn off all lights and sounds.  If unable to fall asleep within 30 to 45 minutes of getting in bed, patient should get up and try to do something until she feels sleepy again.  At that time try getting back in bed.   Follow Up Instructions: As previously scheduled.   I discussed the assessment and treatment plan with the patient. The patient was provided an opportunity to ask questions and all were answered. The patient agreed with the plan and demonstrated an understanding of the instructions.   The patient was advised to call back or seek an in-person evaluation if the symptoms worsen or if the condition fails to improve as anticipated.  I  Spent 9 minutes on this telephone encounter  [name], MD

## 2021-02-27 DIAGNOSIS — F4310 Post-traumatic stress disorder, unspecified: Secondary | ICD-10-CM | POA: Diagnosis not present

## 2021-03-02 ENCOUNTER — Other Ambulatory Visit: Payer: Self-pay | Admitting: Internal Medicine

## 2021-03-02 DIAGNOSIS — J4550 Severe persistent asthma, uncomplicated: Secondary | ICD-10-CM

## 2021-03-02 DIAGNOSIS — Z86718 Personal history of other venous thrombosis and embolism: Secondary | ICD-10-CM

## 2021-03-02 DIAGNOSIS — S46011D Strain of muscle(s) and tendon(s) of the rotator cuff of right shoulder, subsequent encounter: Secondary | ICD-10-CM

## 2021-03-02 DIAGNOSIS — Z794 Long term (current) use of insulin: Secondary | ICD-10-CM

## 2021-03-02 DIAGNOSIS — I10 Essential (primary) hypertension: Secondary | ICD-10-CM

## 2021-03-02 DIAGNOSIS — E118 Type 2 diabetes mellitus with unspecified complications: Secondary | ICD-10-CM

## 2021-03-14 ENCOUNTER — Other Ambulatory Visit: Payer: Self-pay

## 2021-03-14 ENCOUNTER — Other Ambulatory Visit: Payer: Self-pay | Admitting: Obstetrics and Gynecology

## 2021-03-14 NOTE — Patient Outreach (Signed)
Medicaid Managed Care   Nurse Care Manager Note  03/14/2021 Name:  Melinda [name] MRN:  [id number] DOB:  [birth year]  Melinda [name] is an 55 y.o. year old female who is a primary [name] of [name], MD.  The [facility] Managed Care Coordination team was consulted for assistance with:    Chronic healthcare management needs.  [name] was given information about Medicaid Managed Care Coordination team services today. Melinda [name] agreed to services and verbal consent obtained.  Engaged with [name] by telephone for follow up visit in response to provider referral for case management and/or care coordination services.   Assessments/Interventions:  Review of past medical history, allergies, medications, health status, including review of consultants reports, laboratory and other test data, was performed as part of comprehensive evaluation and provision of chronic care management services.  SDOH (Social Determinants of Health) assessments and interventions performed:   Care Plan  Allergies  Allergen Reactions   Ceftriaxone Anaphylaxis    ROCEPHIN   Penicillins Shortness Of Breath    Has [name] had a PCN reaction causing immediate rash, facial/tongue/throat swelling, SOB or lightheadedness with hypotension: Yes Has [name] had a PCN reaction causing severe rash involving mucus membranes or skin necrosis: No Has [name] had a PCN reaction that required hospitalization No Has [name] had a PCN reaction occurring within the last 10 years: No If all of the above answers are "NO", then may proceed with Cephalosporin use.    Shellfish Allergy Anaphylaxis   Flonase [Fluticasone Propionate]     Makes migraines worse   Gold-Containing Drug Products     HANDS ITCH   Nickel     HANDS SWELL   Citrus Rash    Medications Reviewed Today     Reviewed by Gayla Medicus, RN (Registered Nurse) on 03/14/21 at 1300  Med List Status: <None>   Medication Order Taking? Sig Documenting  Provider Last Dose Status Informant  albuterol (PROVENTIL) (2.5 MG/3ML) 0.083% nebulizer solution [id number] No Take 3 mLs (2.5 mg total) by nebulization every 6 (six) hours as needed for wheezing or shortness of breath. [name], MD Taking Active   albuterol (VENTOLIN HFA) 108 (90 Base) MCG/ACT inhaler [id number] No Inhale 2 puffs into the lungs every 6 (six) hours as needed for wheezing or shortness of breath. [name], MD Taking Active   atorvastatin (LIPITOR) 20 MG tablet [id number]  TAKE ONE TABLET BY MOUTH ONCE DAILY AT BEDTIME [name], MD  Active   budesonide-formoterol [facility]) 160-4.5 MCG/ACT inhaler [id number]  Inhale 2 puffs into the lungs 2 (two) times daily. [name], MD  Active   buPROPion (WELLBUTRIN XL) 300 MG 24 hr tablet [id number] No Take 300 mg by mouth daily.  [provider] Taking Active Self  busPIRone (BUSPAR) 5 MG tablet [id number] No Take 5 mg by mouth 3 (three) times daily. [provider] Taking Active Self           Med Note Melinda [name], [facility]   Wed Feb 18, 2018 10:03 PM)    Cholecalciferol (VITAMIN D3) 10 MCG (400 UNIT) tablet [id number]  Take 1 tablet (400 Units total) by mouth daily. [name], MD  Active   citalopram (CELEXA) 40 MG tablet [id number] No Take 40 mg by mouth daily.  [provider] Taking Active Self  cyclobenzaprine (FLEXERIL) 10 MG tablet [id number]  TAKE 1 TABLET (10 MG TOTAL) BY MOUTH 3 (THREE) TIMES DAILY AS NEEDED FOR MUSCLE SPASMS. Melinda [name]  B, MD  Active   doxepin (SINEQUAN) 10 MG capsule [id number] No Take 10 mg by mouth. [provider] Taking Active   ELIQUIS 5 MG TABS tablet [id number]  TAKE ONE TABLET BY MOUTH TWICE DAILY MORNING AND EVENING [name], MD  Active   gabapentin (NEURONTIN) 300 MG capsule [id number]  TAKE 1 CAPSULE (300 MG TOTAL) BY MOUTH 3 (THREE) TIMES DAILY. [name], MD  Active   hydrochlorothiazide (MICROZIDE) 12.5  MG capsule [id number]  TAKE ONE CAPSULE BY MOUTH ONCE DAILY(AM) [name], MD  Active   metoprolol tartrate (LOPRESSOR) 50 MG tablet [id number]  TAKE ONE TABLET BY MOUTH TWICE DAILY [name], MD  Active   montelukast (SINGULAIR) 10 MG tablet [id number]  TAKE 1 TABLET BY MOUTH EVERY EVENING AT BEDTIME [name], MD  Active   PAZEO 0.7 % SOLN [id number] No   [name] not taking: Reported on 07/21/2020   [provider] Not Taking Active   prazosin (MINIPRESS) 1 MG capsule [id number] No Take 6 mg by mouth at bedtime. [provider] Taking Active Self  predniSONE (DELTASONE) 10 MG tablet [id number]  Take 1 tablet (10 mg total) by mouth daily with breakfast. [name], MD  Active   sodium chloride (OCEAN) 0.65 % SOLN nasal spray [id number]  Place 1 spray into both nostrils as needed for congestion. [name], MD  Active   SYNTHROID 200 MCG tablet [id number] No 175 mcg. [provider] Taking Active Self  topiramate (TOPAMAX) 200 MG tablet [id number] No TAKE 1 TABLET (200 MG TOTAL) BY MOUTH AT BEDTIME. Melinda Partridge, DO Taking Active   triamcinolone cream (KENALOG) 0.1 % [id number]  Apply 1 application topically 2 (two) times daily. [name], MD  Active   TRULICITY 8.18 EX/9.3ZJ [employer] Aid [id number] No Inject 1.5 mg into the skin. [provider] Taking Active Self            [name] Active Problem List   Diagnosis Date Noted   At high risk for falls 04/20/2020   Hyperlipidemia 02/16/2020   Adrenal mass, right ([place]) 04/29/2019   Chronic cough 03/12/2018   Left shoulder pain 10/14/2016   Hypertension 10/14/2016   S/P thyroidectomy 06/27/2016   Chronic bilateral low back pain without sciatica 04/22/2016   Liver lesion 12/24/2015   Controlled type 2 diabetes mellitus with complication, with long-term current use of insulin ([place]) 10/10/2015   Vision loss 08/25/2015   Decreased vision in both eyes 08/25/2015   Prolonged  Q-T interval on ECG 08/19/2015   History of pulmonary embolism 08/10/2015   Mild persistent asthma 04/13/2015   Fibroid, uterine    Morbid obesity ([place]) 11/11/2014   Atypical chest pain 11/08/2014   Sinus tachycardia 11/08/2014   Migraine 10/14/2014   Menorrhagia 09/08/2014    Conditions to be addressed/monitored per PCP order:   chronic healthcare management needs, DM2 with neuropathy, asthma, glaucoma, chronic low back pain, anxiety, depression, PTSD,  HTN, migraines, HLD  Care Plan : General Plan of Care (Adult)  Updates made by Gayla Medicus, RN since 03/14/2021 12:00 AM     Problem: Health Promotion or Disease Self-Management (General Plan of Care)   Priority: Medium  Onset Date: 10/20/2020     Long-Range Goal: Self-Management Plan Developed   Start Date: 07/14/2020  Expected End Date: 05/17/2021  Recent Progress: On track  Priority: Medium  Note:    Current Barriers:  Chronic Disease Management support and education needs  [name] with increased anxiety and depression-has appointment with Psychiatrist  scheduled every 3 months-psychotherapy appointment scheduled.  Trouble sleeping. Update 12/07/20:  Referral made to [place] for therapy services. Update 01/17/21:  [name] has appointment scheduled for therapy services, 02/27/21. Update 03/14/21:  [name] continues to see Psychiatrist/therapist.  Has trouble falling asleep.  Discussed sleep hygiene, sleep study. [name] complaining of nose bleeds and productive cough with green sputum. [name] has appointment with ENT 09/22/20 at 130. Update 09/19/20:  [name] states nose bleeds continue daily-? Increase.  Cough with clear sputum now. Update 10/20/20:  [name] states nose bleeds have improved after seeing Dr. Benjamine Mola, no cough. [name] with increased uterine bleeding-changing pad every 1-2 hours-h/o uterine fibroids.  To schedule appointment with [facility] at [facility]. Update 09/19/20:  [name] states uterine bleeding has  stopped.  [name] still needs to schedule appointment with GYN. Update 10/20/20:  No bleeding reported-has appointment with Dr. Wynetta Emery for follow up. [name] states she fell out of bed and now having shoulder and neck pain-to see MD today, 09/11/20. Update 09/19/20:  [name] states shoulder and neck pain have resolved and did not need to see MD-c/o left knee pain-using ice which is helping. Update 10/20/20:  Some knee pain continues-to see provider as needed. Update 12/07/20:  [name] states that shoulder, neck, and knee pain are not a problem right now-does have some back pain and is using heating pad with help.  Nurse Case Manager Clinical Goal(s):  Over the next 90 days, [name] will attend all scheduled medical appointments:  Update 09/11/20:  [name] is attending all appointments. Over the next 30 days, [name] will work with CM team pharmacist to review medications. Update 08/14/20:  [name] met with CM team pharmacist 07/21/20 and is following.  Interventions:  Inter-disciplinary care team collaboration (see longitudinal plan of care) Evaluation of current treatment plan  and [name]'s adherence to plan as established by provider. Reviewed medications with [name]. Collaborated with pharmacy regarding medications. Discussed plans with [name] for ongoing care management follow up and provided [name] with direct contact information for care management team Reviewed scheduled/upcoming provider appointments. Update 02/14/21:  [name] to discuss insomnia with providers as scheduled. Collaborated with SW for psychotherapy referral. SW referral for psychotherapy provider. Pharmacy referral for medication review. Update 08/14/20:  Encouraged [name] to call PCP for evaluation of nose bleeds and productive cough. Update 09/11/20:  [name] evaluated by PCP 08/21/20, has appointment with ENT scheduled 09/22/20 at 130.  [name] to also schedule appointment with GYN for uterine bleeding and see MD for  shoulder and neck pain. Update 09/19/20:  Shoulder and neck pain improved-doesn't feel like she needs to see MD.  Still needs to schedule appointment with GYN. Update 10/20/20:  [name] has an appointment with Dr. Wynetta Emery for GYN evaluation. Update 12/07/20:  Referral has been [facility] to [facility] for [facility] services-[name] awaiting appointment. Update 01/17/21:  [name] states she has appointment for therapy.  Self Care Activities: Over the next 90 days, [name] will:  -Self administers medications as prescribed Attends all scheduled provider appointments Calls pharmacy for medication refills Calls provider office for new concerns or questions  Follow Up Plan: The Managed Medicaid care management team will reach out to the [name] again over the next 30 days.  The [name] has been provided with contact information for the Managed Medicaid care management team and has been advised to call with any health related questions or concerns.   Evidence-based guidance:  Review biopsychosocial determinants of health screens.  Determine  level of modifiable health risk.  Assess level of [name] activation, level of readiness, importance and confidence to make changes.  Evoke change talk using open-ended questions, pros and cons, as well as looking forward.  Identify areas where behavior change may lead to improved health.  Partner with [name] to develop a robust self-management plan that includes lifestyle factors, such as weight loss, exercise and healthy nutrition, as well as goals specific to disease risks.  Support [name] and family/caregiver active participation in decision-making and self-management plan.  Implement additional goals and interventions based on identified risk factors to reduce health risk.  Facilitate advance care planning.  Review need for preventive screening based on age, sex, family history and health history.       Follow Up:  [name] agrees to Care Plan and  Follow-up.  Plan: The Managed Medicaid care management team will reach out to the [name] again over the next 30 days. and The [name] has been provided with contact information for the Managed Medicaid care management team and has been advised to call with any health related questions or concerns.  Date/time of next scheduled RN care management/care coordination outreach:  04/13/21 at 1245.

## 2021-03-14 NOTE — Patient Instructions (Signed)
Hi Melinda [name], thank you for speaking with me today.  Melinda [name] was given information about Medicaid Managed Care team care coordination services as a part of their [place] Medicaid benefit. Melinda [name] verbally consented to engagement with the [facility] Managed Care team.   If you are experiencing a medical emergency, please call 911 or report to your local emergency department or urgent care.   If you have a non-emergency medical problem during routine business hours, please contact your provider's office and ask to speak with a nurse.   For questions related to your [facility], please call: [phone] or visit the homepage here: [email or web address]  If you would like to schedule transportation through your [facility], please call the following number at least 2 days in advance of your appointment: [phone].   Call the [place] at [phone], at any time, 24 hours a day, 7 days a week. If you are in danger or need immediate medical attention call 911.  If you would like help to quit smoking, call 1-800-QUIT-NOW 847-351-0276) OR Espaol: 1-855-Djelo-Ya (9-532-023-3435) o para ms informacin haga clic aqu or Text READY to 200-400 to register via text  Melinda [name] - following are the goals we discussed in your visit today:   Goals Addressed             This Visit's Progress    Protect My Health       Timeframe:  Long-Range Goal Priority:  Medium Start Date:    07/14/20                         Expected End Date:  ongoing          Follow Up Date:  04/13/21 - schedule appointment for vaccines needed due to my age or health - schedule recommended health tests (blood work, mammogram, colonoscopy, pap test) - schedule and keep appointment for annual check-up  Update 08/14/20:  patient has scheduled appointment with PCP 08/21/20 Update  09/11/20:  patient attended appointment with PCP on 08/21/20.  Has appointment scheduled with ENT 09/22/20 due to nose bleeds.  Encouraged patient to schedule appointment with GYN due to bleeding. Update 09/19/20:  Patient states vaginal bleeding has stopped-still needs to schedule appointment with GYN.  Nosebleeds continue daily? More-has appointment 09/22/20 with ENT. Update 10/20/20:  Nose bleeds have stopped-has follow up appointment with Dr. Benjamine Mola on 10/24/20.  Has appointment with Dr. Wynetta Emery for GYN follow up. Update 11/09/20:  Patient attending all appointments-has blood work scheduled next week.  Appointment with Dr. Wynetta Emery 12/19/20. Update 12/07/20:  No new updates today-referral made to [facility] for therapist. Update 01/17/21:  Patient states she has an appointment for therapy. Update 02/14/21:  Patient has therapist appointment 02/27/21, Awaiting appointment with new Psychiatrist. Update 03/14/21:  Patient seen and evaluated by PCP 02/19/21.  Now seeing new Psychiatrist.        The patient verbalized understanding of instructions provided today and declined a print copy of patient instruction materials.   The Managed Medicaid care management team will reach out to the patient again over the next 30 days.  The  Patient has been provided with contact information for the Managed Medicaid care management team and has been advised to call with any health related questions or concerns.  Melinda Raider RN, BSN [place]  [facility] - Managed Medicaid High Risk [phone].  Following is a copy of your plan of care:   Patient Care Plan: General Plan of Care (Adult)     Problem Identified: Health Promotion or Disease Self-Management (General Plan of Care)   Priority: Medium  Onset Date: 10/20/2020     Long-Range Goal: Self-Management Plan Developed   Start Date: 07/14/2020  Expected End Date: 05/17/2021  Recent Progress: On track  Priority: Medium   Note:    Current Barriers:  Chronic Disease Management support and education needs  Patient with increased anxiety and depression-has appointment with Psychiatrist  scheduled every 3 months-psychotherapy appointment scheduled.  Trouble sleeping. Update 12/07/20:  Referral made to [facility] for therapy services. Update 01/17/21:  Patient has appointment scheduled for therapy services, 02/27/21. Update 03/14/21:  Patient continues to see Psychiatrist/therapist.  Has trouble falling asleep.  Discussed sleep hygiene, sleep study. Patient complaining of nose bleeds and productive cough with green sputum. Patient has appointment with ENT 09/22/20 at 130. Update 09/19/20:  Patient states nose bleeds continue daily-? Increase.  Cough with clear sputum now. Update 10/20/20:  Patient states nose bleeds have improved after seeing Dr. Benjamine Mola, no cough. Patient with increased uterine bleeding-changing pad every 1-2 hours-h/o uterine fibroids.  To schedule [facility] with [facility] at [facility]. Update 09/19/20:  Patient states uterine bleeding has stopped.  Patient still needs to schedule appointment with GYN. Update 10/20/20:  No bleeding reported-has appointment with Dr. Wynetta Emery for follow up. Patient states she fell out of bed and now having shoulder and neck pain-to see MD today, 09/11/20. Update 09/19/20:  Patient states shoulder and neck pain have resolved and did not need to see MD-c/o left knee pain-using ice which is helping. Update 10/20/20:  Some knee pain continues-to see provider as needed. Update 12/07/20:  Patient states that shoulder, neck, and knee pain are not a problem right now-does have some back pain and is using heating pad with help.  Nurse Case Manager Clinical Goal(s):  Over the next 90 days, patient will attend all scheduled medical appointments:  Update 09/11/20:  Patient is attending all appointments. Over the next 30 days, patient will work with CM team pharmacist to review  medications. Update 08/14/20:  patient met with CM team pharmacist 07/21/20 and is following.  Interventions:  Inter-disciplinary care team collaboration (see longitudinal plan of care) Evaluation of current treatment plan  and patient's adherence to plan as established by provider. Reviewed medications with patient. Collaborated with pharmacy regarding medications. Discussed plans with patient for ongoing care management follow up and provided patient with direct contact information for care management team Reviewed scheduled/upcoming provider appointments. Update 02/14/21:  patient to discuss insomnia with providers as scheduled. Collaborated with SW for psychotherapy referral. SW referral for psychotherapy provider. Pharmacy referral for medication review. Update 08/14/20:  Encouraged patient to call PCP for evaluation of nose bleeds and productive cough. Update 09/11/20:  Patient evaluated by PCP 08/21/20, has appointment with ENT scheduled 09/22/20 at 130.  Patient to also schedule appointment with GYN for uterine bleeding and see MD for shoulder and neck pain. Update 09/19/20:  Shoulder and neck pain improved-doesn't feel like she needs to see MD.  Still needs to schedule appointment with GYN. Update 10/20/20:  Patient has an appointment with Dr. Wynetta Emery for GYN evaluation. Update 12/07/20:  Referral has been made to [facility] for therapy services-patient awaiting appointment. Update 01/17/21:  Patient states she has appointment for therapy.  Self Care Activities: Over the next 90 days, patient will:  -Self administers  medications as prescribed Attends all scheduled provider appointments Calls pharmacy for medication refills Calls provider office for new concerns or questions  Follow Up Plan: The Managed Medicaid care management team will reach out to the patient again over the next 30 days.  The patient has been provided with contact information for the Managed Medicaid care management team and  has been advised to call with any health related questions or concerns.   Evidence-based guidance:  Review biopsychosocial determinants of health screens.  Determine level of modifiable health risk.  Assess level of patient activation, level of readiness, importance and confidence to make changes.  Evoke change talk using open-ended questions, pros and cons, as well as looking forward.  Identify areas where behavior change [name] lead to improved health.  Partner with patient to develop a robust self-management plan that includes lifestyle factors, such as weight loss, exercise and healthy nutrition, as well as goals specific to disease risks.  Support patient and family/caregiver active participation in decision-making and self-management plan.  Implement additional goals and interventions based on identified risk factors to reduce health risk.  Facilitate advance care planning.  Review need for preventive screening based on age, sex, family history and health history.

## 2021-03-20 DIAGNOSIS — H40013 Open angle with borderline findings, low risk, bilateral: Secondary | ICD-10-CM | POA: Diagnosis not present

## 2021-03-20 DIAGNOSIS — H0102B Squamous blepharitis left eye, upper and lower eyelids: Secondary | ICD-10-CM | POA: Diagnosis not present

## 2021-03-20 DIAGNOSIS — H04123 Dry eye syndrome of bilateral lacrimal glands: Secondary | ICD-10-CM | POA: Diagnosis not present

## 2021-03-20 DIAGNOSIS — E119 Type 2 diabetes mellitus without complications: Secondary | ICD-10-CM | POA: Diagnosis not present

## 2021-03-20 DIAGNOSIS — H31092 Other chorioretinal scars, left eye: Secondary | ICD-10-CM | POA: Diagnosis not present

## 2021-03-20 DIAGNOSIS — H2513 Age-related nuclear cataract, bilateral: Secondary | ICD-10-CM | POA: Diagnosis not present

## 2021-03-20 DIAGNOSIS — H05243 Constant exophthalmos, bilateral: Secondary | ICD-10-CM | POA: Diagnosis not present

## 2021-03-20 DIAGNOSIS — H0102A Squamous blepharitis right eye, upper and lower eyelids: Secondary | ICD-10-CM | POA: Diagnosis not present

## 2021-03-20 LAB — HM DIABETES EYE EXAM

## 2021-03-21 DIAGNOSIS — F4310 Post-traumatic stress disorder, unspecified: Secondary | ICD-10-CM | POA: Diagnosis not present

## 2021-03-27 ENCOUNTER — Other Ambulatory Visit: Payer: Self-pay | Admitting: Internal Medicine

## 2021-03-27 DIAGNOSIS — Z85850 Personal history of malignant neoplasm of thyroid: Secondary | ICD-10-CM | POA: Diagnosis not present

## 2021-03-27 DIAGNOSIS — E119 Type 2 diabetes mellitus without complications: Secondary | ICD-10-CM | POA: Diagnosis not present

## 2021-03-27 DIAGNOSIS — Z1231 Encounter for screening mammogram for malignant neoplasm of breast: Secondary | ICD-10-CM

## 2021-03-27 DIAGNOSIS — E890 Postprocedural hypothyroidism: Secondary | ICD-10-CM | POA: Diagnosis not present

## 2021-03-27 DIAGNOSIS — R0602 Shortness of breath: Secondary | ICD-10-CM | POA: Diagnosis not present

## 2021-04-02 ENCOUNTER — Other Ambulatory Visit: Payer: Self-pay | Admitting: Internal Medicine

## 2021-04-02 DIAGNOSIS — S46011D Strain of muscle(s) and tendon(s) of the rotator cuff of right shoulder, subsequent encounter: Secondary | ICD-10-CM

## 2021-04-02 NOTE — Telephone Encounter (Signed)
Copied from [place] [id number]. Topic: Quick Communication - Rx Refill/Question >> Apr 02, 2021  3:34 PM Tessa Lerner A wrote: Medication: gabapentin (NEURONTIN) 300 MG capsule [[id number]]   Has the patient contacted their pharmacy? Yes.   The pharmacy shared that they were unable to make contact with the practice and asked the patient to do so  Preferred Pharmacy (with phone number or street name): [place], [place]  Phone:  [phone] Fax:  [phone]    Has the patient been seen for an appointment in the last year OR does the patient have an upcoming appointment? Yes.    Agent: Please be advised that RX refills may take up to 3 business days. We ask that you follow-up with your pharmacy.

## 2021-04-04 DIAGNOSIS — F4310 Post-traumatic stress disorder, unspecified: Secondary | ICD-10-CM | POA: Diagnosis not present

## 2021-04-12 ENCOUNTER — Other Ambulatory Visit: Payer: Self-pay | Admitting: Obstetrics and Gynecology

## 2021-04-12 ENCOUNTER — Other Ambulatory Visit: Payer: Self-pay

## 2021-04-12 NOTE — Patient Instructions (Signed)
Hi Melinda [name], thank you for speaking with me today, have a great afternoon!  Melinda [name] was given information about Medicaid Managed Care team care coordination services as a part of their [place] Medicaid benefit. Melinda [name] verbally consented to engagement with the [facility] Managed Care team.   If you are experiencing a medical emergency, please call 911 or report to your local emergency department or urgent care.   If you have a non-emergency medical problem during routine business hours, please contact your provider's office and ask to speak with a nurse.   For questions related to your [facility], please call: [phone] or visit the homepage here: [email or web address]  If you would like to schedule transportation through your [facility], please call the following number at least 2 days in advance of your appointment: [phone].   Call the [place] at [phone], at any time, 24 hours a day, 7 days a week. If you are in danger or need immediate medical attention call 911.  If you would like help to quit smoking, call 1-800-QUIT-NOW 814-106-9126) OR Espaol: 1-855-Djelo-Ya (5-366-440-3474) o para ms informacin haga clic aqu or Text READY to 200-400 to register via text  Melinda [name] - following are the goals we discussed in your visit today:   Goals Addressed             This Visit's Progress    Protect My Health       Timeframe:  Long-Range Goal Priority:  Medium Start Date:    07/14/20                         Expected End Date:  ongoing          Follow Up Date: 05/13/21 - schedule appointment for vaccines needed due to my age or health - schedule recommended health tests (blood work, mammogram, colonoscopy, pap test) - schedule and keep appointment for annual check-up  Update 08/14/20:  patient has scheduled appointment  with PCP 08/21/20 Update 09/11/20:  patient attended appointment with PCP on 08/21/20.  Has appointment scheduled with ENT 09/22/20 due to nose bleeds.  Encouraged patient to schedule appointment with GYN due to bleeding. Update 09/19/20:  Patient states vaginal bleeding has stopped-still needs to schedule appointment with GYN.  Nosebleeds continue daily? More-has appointment 09/22/20 with ENT. Update 10/20/20:  Nose bleeds have stopped-has follow up appointment with Dr. Benjamine Mola on 10/24/20.  Has appointment with Dr. Wynetta Emery for GYN follow up. Update 11/09/20:  Patient attending all appointments-has blood work scheduled next week.  Appointment with Dr. Wynetta Emery 12/19/20. Update 12/07/20:  No new updates today-referral made to [facility] for therapist. Update 01/17/21:  Patient states she has an appointment for therapy. Update 02/14/21:  Patient has therapist appointment 02/27/21, Awaiting appointment with new Psychiatrist. Update 03/14/21:  Patient seen and evaluated by PCP 02/19/21.  Now seeing new Psychiatrist. Update 04/12/21:  Patient has appointment with PCP 04/20/21, has had PNA vaccine and 4th COVID shot.  Will get flu shot when she sees PCP 04/20/21.  Saw Dr. Buddy Duty beginning of October.  Has follow up with neuro in Nov and eye provider in March.   The patient verbalized understanding of instructions provided today and declined a print copy of patient instruction materials.   The Managed Medicaid care management team will reach out to the patient again over the next 30 days.  The  Patient has been  provided with contact information for the Managed Medicaid care management team and has been advised to call with any health related questions or concerns.   Terri Craft RN, BSN [place]  [facility] Care Management Coordinator - Managed Medicaid High Risk [phone]    Following is a copy of your plan of care:  Patient Care Plan: General Plan of Care (Adult)     Problem Identified: Health  Promotion or Disease Self-Management (General Plan of Care)   Priority: Medium  Onset Date: 10/20/2020     Long-Range Goal: Self-Management Plan Developed   Start Date: 07/14/2020  Expected End Date: 07/13/2021  Recent Progress: On track  Priority: Medium  Note:    Current Barriers:  Chronic Disease Management support and education needs  Patient with increased anxiety and depression-has appointment with Psychiatrist  scheduled every 3 months-psychotherapy appointment scheduled.  Trouble sleeping. Update 12/07/20:  Referral made to [facility] for therapy services. Update 01/17/21:  Patient has appointment scheduled for therapy services, 02/27/21. Update 03/14/21:  Patient continues to see Psychiatrist/therapist.  Has trouble falling asleep.  Discussed sleep hygiene, sleep study. Update 04/12/21:  Patient continues to see Psychiatrist every 3 months, is now seeing therapist at [facility] every 2 weeks-patient states good results.  Patient is sleeping better with set schedule. Patient complaining of nose bleeds and productive cough with green sputum. Patient has appointment with ENT 09/22/20 at 130. Update 09/19/20:  Patient states nose bleeds continue daily-? Increase.  Cough with clear sputum now. Update 10/20/20:  Patient states nose bleeds have improved after seeing Dr. Teoh, no cough. Patient with increased uterine bleeding-changing pad every 1-2 hours-h/o uterine fibroids.  [facility]. Update 09/19/20:  Patient states uterine bleeding has stopped.  Patient still needs to schedule appointment with GYN. Update 10/20/20:  No bleeding reported-has appointment with Dr. Johnson for follow up. Patient states she fell out of bed and now having shoulder and neck pain-to see MD today, 09/11/20. Update 09/19/20:  Patient states shoulder and neck pain have resolved and did not need to see MD-c/o left knee pain-using ice which is helping. Update  10/20/20:  Some knee pain continues-to see provider as needed. Update 12/07/20:  Patient states that shoulder, neck, and knee pain are not a problem right now-does have some back pain and is using heating pad with help.  Nurse Case Manager Clinical Goal(s):  Over the next 90 days, patient will attend all scheduled medical appointments:  Update 09/11/20:  Patient is attending all appointments. Over the next 30 days, patient will work with CM team pharmacist to review medications. Update 08/14/20:  patient met with CM team pharmacist 07/21/20 and is following.  Interventions:  Inter-disciplinary care team collaboration (see longitudinal plan of care) Evaluation of current treatment plan  and patient's adherence to plan as established by provider. Reviewed medications with patient. Collaborated with pharmacy regarding medications. Discussed plans with patient for ongoing care management follow up and provided patient with direct contact information for care management team Reviewed scheduled/upcoming provider appointments. Update 02/14/21:  patient to discuss insomnia with providers as scheduled-improved Collaborated with SW for psychotherapy referral. SW referral for psychotherapy provider. Pharmacy referral for medication review. Update 08/14/20:  Encouraged patient to call PCP for evaluation of nose bleeds and productive cough. Update 09/11/20:  Patient evaluated by PCP 08/21/20, has appointment with ENT scheduled 09/22/20 at 130.  Patient to also schedule appointment with GYN for uterine bleeding and see MD   for shoulder and neck pain. Update 09/19/20:  Shoulder and neck pain improved-doesn't feel like she needs to see MD.  Still needs to schedule appointment with GYN. Update 10/20/20:  Patient has an appointment with Dr. Johnson for GYN evaluation. Update 12/07/20:  Referral has been made to [facility] for therapy services-patient awaiting appointment. Update 01/17/21:  Patient states she has appointment for  therapy.  Self Care Activities: Over the next 90 days, patient will:  -Self administers medications as prescribed Attends all scheduled provider appointments Calls pharmacy for medication refills Calls provider office for new concerns or questions  Follow Up Plan: The Managed Medicaid care management team will reach out to the patient again over the next 30 days.  The patient has been provided with contact information for the Managed Medicaid care management team and has been advised to call with any health related questions or concerns.   Evidence-based guidance:  Review biopsychosocial determinants of health screens.  Determine level of modifiable health risk.  Assess level of patient activation, level of readiness, importance and confidence to make changes.  Evoke change talk using open-ended questions, pros and cons, as well as looking forward.  Identify areas where behavior change [name] lead to improved health.  Partner with patient to develop a robust self-management plan that includes lifestyle factors, such as weight loss, exercise and healthy nutrition, as well as goals specific to disease risks.  Support patient and family/caregiver active participation in decision-making and self-management plan.  Implement additional goals and interventions based on identified risk factors to reduce health risk.  Facilitate advance care planning.  Review need for preventive screening based on age, sex, family history and health history.       

## 2021-04-12 NOTE — Patient Outreach (Signed)
Medicaid Managed Care   Nurse Care Manager Note  04/12/2021 Name:  Melinda [name] MRN:  [id number] DOB:  17-Jun-1966  Melinda [name] is an 55 y.o. year old female who is a primary patient of [name], MD.  The [facility] Managed Care Coordination team was consulted for assistance with:    Chronic healthcare management needs.  Melinda [name] was given information about Medicaid Managed Care Coordination team services today. [name] Patient agreed to services and verbal consent obtained.  Engaged with patient by telephone for follow up visit in response to provider referral for case management and/or care coordination services.   Assessments/Interventions:  Review of past medical history, allergies, medications, health status, including review of consultants reports, laboratory and other test data, was performed as part of comprehensive evaluation and provision of chronic care management services.  SDOH (Social Determinants of Health) assessments and interventions performed: SDOH Interventions    Flowsheet Row Most Recent Value  SDOH Interventions   Depression Interventions/Treatment  Counseling, Medication, Currently on Treatment       Care Plan  Allergies  Allergen Reactions   Ceftriaxone Anaphylaxis    ROCEPHIN   Penicillins Shortness Of Breath    Has patient had a PCN reaction causing immediate rash, facial/tongue/throat swelling, SOB or lightheadedness with hypotension: Yes Has patient had a PCN reaction causing severe rash involving mucus membranes or skin necrosis: No Has patient had a PCN reaction that required hospitalization No Has patient had a PCN reaction occurring within the last 10 years: No If all of the above answers are "NO", then may proceed with Cephalosporin use.    Shellfish Allergy Anaphylaxis   Flonase [Fluticasone Propionate]     Makes migraines worse   Gold-Containing Drug Products     HANDS ITCH   Nickel     HANDS SWELL   Citrus Rash     Medications Reviewed Today     Reviewed by Gayla Medicus, RN (Registered Nurse) on 04/12/21 at 1303  Med List Status: <None>   Medication Order Taking? Sig Documenting Provider Last Dose Status Informant  albuterol (PROVENTIL) (2.5 MG/3ML) 0.083% nebulizer solution [id number] No Take 3 mLs (2.5 mg total) by nebulization every 6 (six) hours as needed for wheezing or shortness of breath. [name], MD Taking Active   albuterol (VENTOLIN HFA) 108 (90 Base) MCG/ACT inhaler [id number] No Inhale 2 puffs into the lungs every 6 (six) hours as needed for wheezing or shortness of breath. [name], MD Taking Active   atorvastatin (LIPITOR) 20 MG tablet [id number]  TAKE ONE TABLET BY MOUTH ONCE DAILY AT BEDTIME [name], MD  Active   budesonide-formoterol [facility]) 160-4.5 MCG/ACT inhaler [id number]  Inhale 2 puffs into the lungs 2 (two) times daily. [name], MD  Active   buPROPion (WELLBUTRIN XL) 300 MG 24 hr tablet [id number] No Take 300 mg by mouth daily.  [provider] Taking Active Self  busPIRone (BUSPAR) 5 MG tablet [id number] No Take 5 mg by mouth 3 (three) times daily. [provider] Taking Active Self           Med Note [name], [facility] I   Wed Feb 18, 2018 10:03 PM)    Cholecalciferol (VITAMIN D3) 10 MCG (400 UNIT) tablet [id number]  Take 1 tablet (400 Units total) by mouth daily. [name], MD  Active   citalopram (CELEXA) 40 MG tablet [id number] No Take 40 mg by mouth daily.  [provider] Taking Active Self  cyclobenzaprine (FLEXERIL) 10 MG tablet [id number]  TAKE 1 TABLET (10 MG TOTAL) BY MOUTH 3 (THREE) TIMES DAILY AS NEEDED FOR MUSCLE SPASMS. [name], MD  Active   doxepin (SINEQUAN) 10 MG capsule [id number] No Take 10 mg by mouth. [provider] Taking Active   ELIQUIS 5 MG TABS tablet [id number]  TAKE ONE TABLET BY MOUTH TWICE DAILY MORNING AND EVENING [name], MD  Active    gabapentin (NEURONTIN) 300 MG capsule [id number]  TAKE 1 CAPSULE (300 MG TOTAL) BY MOUTH 3 (THREE) TIMES DAILY. [name], MD  Active   hydrochlorothiazide (MICROZIDE) 12.5 MG capsule [id number]  TAKE ONE CAPSULE BY MOUTH ONCE DAILY(AM) [name], MD  Active   metoprolol tartrate (LOPRESSOR) 50 MG tablet [id number]  TAKE ONE TABLET BY MOUTH TWICE DAILY [name], MD  Active   montelukast (SINGULAIR) 10 MG tablet [id number]  TAKE 1 TABLET BY MOUTH EVERY EVENING AT BEDTIME [name], MD  Active   PAZEO 0.7 % SOLN [id number] No   Patient not taking: Reported on 07/21/2020   [provider] Not Taking Active   prazosin (MINIPRESS) 1 MG capsule [id number] No Take 6 mg by mouth at bedtime. [provider] Taking Active Self  predniSONE (DELTASONE) 10 MG tablet [id number]  Take 1 tablet (10 mg total) by mouth daily with breakfast. [name], MD  Active   sodium chloride (OCEAN) 0.65 % SOLN nasal spray [id number]  Place 1 spray into both nostrils as needed for congestion. [name], MD  Active   SYNTHROID 200 MCG tablet [id number] No 200 mcg. [provider] Taking Active Self  topiramate (TOPAMAX) 200 MG tablet [id number] No TAKE 1 TABLET (200 MG TOTAL) BY MOUTH AT BEDTIME. [name], DO Taking Active   triamcinolone cream (KENALOG) 0.1 % [id number]  Apply 1 application topically 2 (two) times daily. [name], MD  Active   TRULICITY 0.93 OI/7.1IW Bonney Aid [id number] No Inject 1.5 mg into the skin. 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   Chronic bilateral low back pain without sciatica 04/22/2016   Liver lesion 12/24/2015    Controlled type 2 diabetes mellitus with complication, with long-term current use of insulin ([place]) 10/10/2015   Vision loss 08/25/2015   Decreased vision in both eyes 08/25/2015   Prolonged Q-T interval on ECG 08/19/2015   History of pulmonary embolism 08/10/2015   Mild persistent asthma 04/13/2015   Fibroid, uterine    Morbid obesity ([place]) 11/11/2014   Atypical chest pain 11/08/2014   Sinus tachycardia 11/08/2014   Migraine 10/14/2014   Menorrhagia 09/08/2014    Conditions to be addressed/monitored per PCP order:   chronic healthcrae management needs, DM2 with neuropathy, HTN, asthma, glaucoma, chronic pain, PTSD, anxiety, migraines, HLD  Care Plan : General Plan of Care (Adult)  Updates made by Gayla Medicus, RN since 04/12/2021 12:00 AM     Problem: Health Promotion or Disease Self-Management (General Plan of Care)   Priority: Medium  Onset Date: 10/20/2020     Long-Range Goal: Self-Management Plan Developed   Start Date: 07/14/2020  Expected End Date: 07/13/2021  Recent Progress: On track  Priority: Medium  Note:    Current Barriers:  Chronic Disease Management support and education needs  Patient with increased anxiety and depression-has appointment with Psychiatrist  scheduled every 3 months-psychotherapy appointment scheduled.  Trouble sleeping. Update 12/07/20:  Referral made to [facility] for therapy services. Update 01/17/21:  Patient has appointment scheduled for therapy services, 02/27/21. Update 03/14/21:  Patient continues to see Psychiatrist/therapist.  Has trouble falling asleep.  Discussed sleep hygiene, sleep study. Update 04/12/21:  Patient continues to see Psychiatrist every 3 months, is now seeing therapist at [place] every 2 weeks-patient states good results.  Patient is sleeping better with set schedule. Patient complaining of nose bleeds and productive cough with green sputum. Patient has appointment with ENT 09/22/20 at 130. Update 09/19/20:  Patient  states nose bleeds continue daily-? Increase.  Cough with clear sputum now. Update 10/20/20:  Patient states nose bleeds have improved after seeing Dr. Benjamine Mola, no cough. Patient with increased uterine bleeding-changing pad every 1-2 hours-h/o uterine fibroids.  To schedule appointment with GYN at [facility]. Update 09/19/20:  Patient states uterine bleeding has stopped.  Patient still needs to schedule appointment with GYN. Update 10/20/20:  No bleeding reported-has appointment with Dr. Wynetta Emery for follow up. Patient states she fell out of bed and now having shoulder and neck pain-to see MD today, 09/11/20. Update 09/19/20:  Patient states shoulder and neck pain have resolved and did not need to see MD-c/o left knee pain-using ice which is helping. Update 10/20/20:  Some knee pain continues-to see provider as needed. Update 12/07/20:  Patient states that shoulder, neck, and knee pain are not a problem right now-does have some back pain and is using heating pad with help.  Nurse Case Manager Clinical Goal(s):  Over the next 90 days, patient will attend all scheduled medical appointments:  Update 09/11/20:  Patient is attending all appointments. Over the next 30 days, patient will work with CM team pharmacist to review medications. Update 08/14/20:  patient met with CM team pharmacist 07/21/20 and is following.  Interventions:  Inter-disciplinary care team collaboration (see longitudinal plan of care) Evaluation of current treatment plan  and patient's adherence to plan as established by provider. Reviewed medications with patient. Collaborated with pharmacy regarding medications. Discussed plans with patient for ongoing care management follow up and provided patient with direct contact information for care management team Reviewed scheduled/upcoming provider appointments. Update 02/14/21:  patient to discuss insomnia with providers as scheduled-improved Collaborated with SW for  psychotherapy referral. SW referral for psychotherapy provider. Pharmacy referral for medication review. Update 08/14/20:  Encouraged patient to call PCP for evaluation of nose bleeds and productive cough. Update 09/11/20:  Patient evaluated by PCP 08/21/20, has appointment with ENT scheduled 09/22/20 at 130.  Patient to also schedule appointment with GYN for uterine bleeding and see MD for shoulder and neck pain. Update 09/19/20:  Shoulder and neck pain improved-doesn't feel like she needs to see MD.  Still needs to schedule appointment with GYN. Update 10/20/20:  Patient has an appointment with Dr. Wynetta Emery for GYN evaluation. Update 12/07/20:  Referral has been made to [facility] for therapy services-patient awaiting appointment. Update 01/17/21:  Patient states she has appointment for therapy.  Self Care Activities: Over the next 90 days, patient will:  -Self administers medications as prescribed Attends all scheduled provider appointments Calls pharmacy for medication refills Calls provider office for new concerns or questions  Follow Up Plan: The Managed Medicaid care management  team will reach out to the patient again over the next 30 days.  The patient has been provided with contact information for the Managed Medicaid care management team and has been advised to call with any health related questions or concerns.   Evidence-based guidance:  Review biopsychosocial determinants of health screens.  Determine level of modifiable health risk.  Assess level of patient activation, level of readiness, importance and confidence to make changes.  Evoke change talk using open-ended questions, pros and cons, as well as looking forward.  Identify areas where behavior change may lead to improved health.  Partner with patient to develop a robust self-management plan that includes lifestyle factors, such as weight loss, exercise and healthy nutrition, as well as goals specific to disease risks.  Support  patient and family/caregiver active participation in decision-making and self-management plan.  Implement additional goals and interventions based on identified risk factors to reduce health risk.  Facilitate advance care planning.  Review need for preventive screening based on age, sex, family history and health history.       Follow Up:  Patient agrees to Care Plan and Follow-up.  Plan: The Managed Medicaid care management team will reach out to the patient again over the next 30 days. and The  Patient has been provided with contact information for the Managed Medicaid care management team and has been advised to call with any health related questions or concerns.  Date/time of next scheduled RN care management/care coordination outreach:  05/08/21 at 1230.

## 2021-04-13 DIAGNOSIS — F411 Generalized anxiety disorder: Secondary | ICD-10-CM | POA: Diagnosis not present

## 2021-04-13 DIAGNOSIS — F4310 Post-traumatic stress disorder, unspecified: Secondary | ICD-10-CM | POA: Diagnosis not present

## 2021-04-13 DIAGNOSIS — F331 Major depressive disorder, recurrent, moderate: Secondary | ICD-10-CM | POA: Diagnosis not present

## 2021-04-18 DIAGNOSIS — F4325 Adjustment disorder with mixed disturbance of emotions and conduct: Secondary | ICD-10-CM | POA: Diagnosis not present

## 2021-04-20 ENCOUNTER — Other Ambulatory Visit: Payer: Self-pay

## 2021-04-20 ENCOUNTER — Ambulatory Visit: Payer: Medicaid Other | Attending: Internal Medicine | Admitting: Internal Medicine

## 2021-04-20 ENCOUNTER — Encounter: Payer: Self-pay | Admitting: Internal Medicine

## 2021-04-20 DIAGNOSIS — Z7901 Long term (current) use of anticoagulants: Secondary | ICD-10-CM | POA: Diagnosis not present

## 2021-04-20 DIAGNOSIS — Z833 Family history of diabetes mellitus: Secondary | ICD-10-CM | POA: Diagnosis not present

## 2021-04-20 DIAGNOSIS — Z86711 Personal history of pulmonary embolism: Secondary | ICD-10-CM | POA: Insufficient documentation

## 2021-04-20 DIAGNOSIS — Z87891 Personal history of nicotine dependence: Secondary | ICD-10-CM | POA: Insufficient documentation

## 2021-04-20 DIAGNOSIS — Z8249 Family history of ischemic heart disease and other diseases of the circulatory system: Secondary | ICD-10-CM | POA: Diagnosis not present

## 2021-04-20 DIAGNOSIS — Z888 Allergy status to other drugs, medicaments and biological substances status: Secondary | ICD-10-CM | POA: Insufficient documentation

## 2021-04-20 DIAGNOSIS — Z880 Allergy status to penicillin: Secondary | ICD-10-CM | POA: Insufficient documentation

## 2021-04-20 DIAGNOSIS — Z79899 Other long term (current) drug therapy: Secondary | ICD-10-CM | POA: Insufficient documentation

## 2021-04-20 DIAGNOSIS — Z7951 Long term (current) use of inhaled steroids: Secondary | ICD-10-CM | POA: Diagnosis not present

## 2021-04-20 DIAGNOSIS — Z23 Encounter for immunization: Secondary | ICD-10-CM | POA: Diagnosis not present

## 2021-04-20 DIAGNOSIS — E1169 Type 2 diabetes mellitus with other specified complication: Secondary | ICD-10-CM | POA: Diagnosis not present

## 2021-04-20 DIAGNOSIS — Z5982 Transportation insecurity: Secondary | ICD-10-CM | POA: Diagnosis not present

## 2021-04-20 DIAGNOSIS — Z881 Allergy status to other antibiotic agents status: Secondary | ICD-10-CM | POA: Insufficient documentation

## 2021-04-20 DIAGNOSIS — E785 Hyperlipidemia, unspecified: Secondary | ICD-10-CM | POA: Diagnosis not present

## 2021-04-20 DIAGNOSIS — I10 Essential (primary) hypertension: Secondary | ICD-10-CM

## 2021-04-20 DIAGNOSIS — Z7985 Long-term (current) use of injectable non-insulin antidiabetic drugs: Secondary | ICD-10-CM | POA: Diagnosis not present

## 2021-04-20 DIAGNOSIS — R1900 Intra-abdominal and pelvic swelling, mass and lump, unspecified site: Secondary | ICD-10-CM

## 2021-04-20 DIAGNOSIS — Z6841 Body Mass Index (BMI) 40.0 and over, adult: Secondary | ICD-10-CM | POA: Insufficient documentation

## 2021-04-20 DIAGNOSIS — E890 Postprocedural hypothyroidism: Secondary | ICD-10-CM | POA: Insufficient documentation

## 2021-04-20 LAB — POCT GLYCOSYLATED HEMOGLOBIN (HGB A1C): HbA1c, POC (controlled diabetic range): 6.1 % (ref 0.0–7.0)

## 2021-04-20 LAB — GLUCOSE, POCT (MANUAL RESULT ENTRY): POC Glucose: 187 mg/dl — AB (ref 70–99)

## 2021-04-20 NOTE — Progress Notes (Signed)
Patient ID: Melinda [name], female    DOB: 1966/03/15  MRN: [id number]  CC: Diabetes   Subjective: Melinda [name] is a 55 y.o. female who presents for chronic ds management Her concerns today include:  Pt with hx of DM (followed by Dr. 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 Reports getting pneumonia and 2nd COVID booster from [place] on [place].  HYPERTENSION Currently taking: see medication list.  She is on hydrochlorothiazide, metoprolol, Med Adherence: [x]  Yes    []  No Medication side effects: []  Yes    [x]  No Adherence with salt restriction: [x]  Yes    []  No Home Monitoring?: [x]  Yes occasionally    []  No Monitoring Frequency: reports good readings Home BP results range:  SOB? [x]  Yes -with ambulation.  This is chronic.    []  No Chest Pain?: []  Yes    [x]  No Leg swelling?: [x]  Yes reports left lower extremity has remained larger than right lower extremity status post fall several months back.  She is on Eliquis for history of DVT/PE    Headaches?: []  Yes    [x]  No Dizziness? []  Yes    [x]  No Comments:   DM/Obesity Last A1C 6.3 a few wks ago when she saw Dr. Buddy [name] Still taking and tolerating Trulicity.   Feels she was doing okay with eating habits until her grandson came to stay with her.  He is a  very good cook.  Eating more and eating things that she should not be eating.  Also snacking late at nights.  Her weight is up 12 pounds from her last visit in June.   Thyroid: saw Melinda [name] a few wks ago.  Synthroid dose increased from 175 mcg to 200 mcg.  Will have repeat TSH next mth  Anxiety/PTSD:  sees her psychiatrist Q 3 mths.  Buspar was changed to 10 mg BID for anxiety. Therapist is at [facility] and she is seen Q 2 wks.  Reports she is doing well on her  medications and with therapy.  Patient Active Problem List   Diagnosis Date Noted   At high risk for falls 04/20/2020   Hyperlipidemia 02/16/2020   Adrenal mass, right ([place]) 04/29/2019   Chronic cough 03/12/2018   Left shoulder pain 10/14/2016   Hypertension 10/14/2016   S/P thyroidectomy 06/27/2016   Chronic bilateral low back pain without sciatica 04/22/2016   Liver lesion 12/24/2015   Controlled type 2 diabetes mellitus with complication, with long-term current use of insulin ([place]) 10/10/2015   Vision loss 08/25/2015   Decreased vision in both eyes 08/25/2015   Prolonged Q-T interval on ECG 08/19/2015   History of pulmonary embolism 08/10/2015   Mild persistent asthma 04/13/2015   Fibroid, uterine    Morbid obesity ([place]) 11/11/2014   Atypical chest pain 11/08/2014   Sinus tachycardia 11/08/2014   Migraine 10/14/2014   Menorrhagia 09/08/2014     Current Outpatient Medications on File Prior to Visit  Medication Sig Dispense Refill   Cholecalciferol (VITAMIN D3) 10 MCG (400 UNIT) tablet Take 1 tablet (400 Units total) by mouth daily. 100 tablet 1   albuterol (PROVENTIL) (2.5 MG/3ML) 0.083% nebulizer solution Take 3 mLs (2.5 mg total) by nebulization every 6 (six) hours as needed for wheezing or shortness of breath. 3 mL 3   albuterol (VENTOLIN HFA) 108 (  90 Base) MCG/ACT inhaler Inhale 2 puffs into the lungs every 6 (six) hours as needed for wheezing or shortness of breath. 8 g 2   atorvastatin (LIPITOR) 20 MG tablet TAKE ONE TABLET BY MOUTH ONCE DAILY AT BEDTIME 30 tablet 2   budesonide-formoterol (SYMBICORT) 160-4.5 MCG/ACT inhaler Inhale 2 puffs into the lungs 2 (two) times daily. 2 each 6   buPROPion (WELLBUTRIN XL) 300 MG 24 hr tablet Take 300 mg by mouth daily.   2   busPIRone (BUSPAR) 5 MG tablet Take 10 mg by mouth 2 (two) times daily.     citalopram (CELEXA) 40 MG tablet Take 40 mg by mouth daily.   2   cyclobenzaprine (FLEXERIL) 10 MG tablet TAKE 1 TABLET (10 MG TOTAL)  BY MOUTH 3 (THREE) TIMES DAILY AS NEEDED FOR MUSCLE SPASMS. 30 tablet 2   doxepin (SINEQUAN) 10 MG capsule Take 10 mg by mouth.     ELIQUIS 5 MG TABS tablet TAKE ONE TABLET BY MOUTH TWICE DAILY MORNING AND EVENING 60 tablet 2   gabapentin (NEURONTIN) 300 MG capsule TAKE 1 CAPSULE (300 MG TOTAL) BY MOUTH 3 (THREE) TIMES DAILY. 90 capsule 0   hydrochlorothiazide (MICROZIDE) 12.5 MG capsule TAKE ONE CAPSULE BY MOUTH ONCE DAILY(AM) 30 capsule 2   metoprolol tartrate (LOPRESSOR) 50 MG tablet TAKE ONE TABLET BY MOUTH TWICE DAILY 60 tablet 2   montelukast (SINGULAIR) 10 MG tablet TAKE 1 TABLET BY MOUTH EVERY EVENING AT BEDTIME 30 tablet 2   PAZEO 0.7 % SOLN  (Patient not taking: No sig reported)     prazosin (MINIPRESS) 1 MG capsule Take 6 mg by mouth at bedtime.     sodium chloride (OCEAN) 0.65 % SOLN nasal spray Place 1 spray into both nostrils as needed for congestion. 30 mL 0   SYNTHROID 200 MCG tablet 200 mcg.     topiramate (TOPAMAX) 200 MG tablet TAKE 1 TABLET (200 MG TOTAL) BY MOUTH AT BEDTIME. 90 tablet 3   triamcinolone cream (KENALOG) 0.1 % Apply 1 application topically 2 (two) times daily. 30 g 0   TRULICITY 8.93 YB/0.1BP SOPN Inject 1.5 mg into the skin.     No current facility-administered medications on file prior to visit.    Allergies  Allergen Reactions   Ceftriaxone Anaphylaxis    ROCEPHIN   Metformin     ALL   Penicillins Shortness Of Breath    Has patient had a PCN reaction causing immediate rash, facial/tongue/throat swelling, SOB or lightheadedness with hypotension: Yes Has patient had a PCN reaction causing severe rash involving mucus membranes or skin necrosis: No Has patient had a PCN reaction that required hospitalization No Has patient had a PCN reaction occurring within the last 10 years: No If all of the above answers are "NO", then [name] proceed with Cephalosporin use.    Shellfish Allergy Anaphylaxis   Flonase [Fluticasone Propionate]     Makes migraines worse    Gold-Containing Drug Products     HANDS ITCH   Nickel     HANDS SWELL   Citrus Rash    Social History   Socioeconomic History   Marital status: Single    Spouse name: Not on file   Number of children: 1   Years of education: 13   Highest education level: Some college, no degree  Occupational History   Occupation: disabled  Tobacco Use   Smoking status: Former    Packs/day: 0.50    Years: 40.00    Pack years:  20.00    Types: Cigarettes    Quit date: 11/03/2014    Years since quitting: 6.4   Smokeless tobacco: Never  Vaping Use   Vaping Use: Never used  Substance and Sexual Activity   Alcohol use: No    Alcohol/week: 0.0 standard drinks   Drug use: No   Sexual activity: Never    Birth control/protection: None  Other Topics Concern   Not on file  Social History Narrative   Patient is right-handed. She lives in a one level handicap accessible apartment. She avoids caffeine. She uses stationary stationary pedals to use for exercise.   Social Determinants of Health   Financial Resource Strain: Low Risk    Difficulty of Paying Living Expenses: Not hard at all  Food Insecurity: No Food Insecurity   Worried About Charity fundraiser in the Last Year: Never true   Arboriculturist in the Last Year: Never true  Transportation Needs: Unmet Transportation Needs   Lack of Transportation (Medical): Yes   Lack of Transportation (Non-Medical): No  Physical Activity: Inactive   Days of Exercise per Week: 0 days   Minutes of Exercise per Session: 0 min  Stress: No Stress Concern Present   Feeling of Stress : Only a little  Social Connections: Socially Isolated   Frequency of Communication with Friends and Family: More than three times a week   Frequency of Social Gatherings with Friends and Family: More than three times a week   Attends Religious Services: Never   Marine scientist or Organizations: No   Attends Music therapist: Never   Marital Status:  Never married  Human resources officer Violence: Not At Risk   Fear of Current or Ex-Partner: No   Emotionally Abused: No   Physically Abused: No   Sexually Abused: No    Family History  Problem Relation Age of Onset   Diabetes Mother    Breast cancer Mother    CAD Mother    Hypertension Mother    Alcohol abuse Father    Hypertension Father    Breast cancer Maternal Aunt    Breast cancer Maternal Aunt     Past Surgical History:  Procedure Laterality Date   ANGIOGRAM TO LEG  08-13-15   RIGHT   CHOLECYSTECTOMY     IR GENERIC HISTORICAL  04/04/2016   IR RADIOLOGIST EVAL & MGMT 04/04/2016 [name], MD [facility] INTERV RAD   THYROIDECTOMY N/A 11/17/2015   Procedure: TOTAL THYROIDECTOMY;  Surgeon: Armandina Gemma, MD;  Location: [facility];  Service: General;  Laterality: N/A;   UTERINE ARTERY EMBOLIZATION Bilateral 08/13/2015    ROS: Review of Systems GI: Reports noticing just this past month that the left middle and lateral side of her abdomen is bigger and appears left-sided compared to the right side.  She has excess tissue there and she feels that it is weighted down.  It is sometimes sore. PHYSICAL EXAM: BP 112/72   Pulse 86   Resp 16   Wt (!) 347 lb 9.6 oz (157.7 kg)   SpO2 96%   BMI 49.88 kg/m   Wt Readings from Last 3 Encounters:  04/20/21 (!) 347 lb 9.6 oz (157.7 kg)  12/19/20 (!) 335 lb 9.6 oz (152.2 kg)  08/21/20 (!) 333 lb 3.2 oz (151.1 kg)    Physical Exam  General appearance - alert, well appearing, morbidly obese African-American female who looks older than stated age and in no distress.  She ambulates with a  Rollator walker.  She is able to get on the exam table.  She is wearing a N95 mask Mental status - normal mood, behavior, speech, dress, motor activity, and thought processes Mouth - mucous membranes moist, pharynx normal without lesions Neck - supple, no significant adenopathy Chest - clear to auscultation, no wheezes, rales or rhonchi, symmetric air entry Heart -  normal rate, regular rhythm, normal S1, S2, no murmurs, rubs, clicks or gallops Abdomen: Abdomen is morbidly obese.  Nontender, no organomegaly and no distinct mass felt.  The asymmetry that she is concerned about is best seen when she is sitting.  She has excess fatty skin that almost looks like a pannus in the left mid side of the abdomen that is more pronounced laterally.  The area is not tender to touch.  It is not erythematous. Extremities -no pitting edema.  However left lower leg slightly larger than right.   CMP Latest Ref Rng & Units 12/19/2020 04/20/2020 12/30/2019  Glucose 65 - 99 mg/dL 113(H) - 128(H)  BUN 6 - 24 mg/dL 10 - 10  Creatinine 0.57 - 1.00 mg/dL 1.00 - 0.85  Sodium 134 - 144 mmol/L 142 - 140  Potassium 3.5 - 5.2 mmol/L 3.6 - 3.4(L)  Chloride 96 - 106 mmol/L 105 - 112(H)  CO2 20 - 29 mmol/L 21 - 20(L)  Calcium 8.7 - 10.2 mg/dL 8.5(L) - 8.3(L)  Total Protein 6.0 - 8.5 g/dL 6.8 6.8 -  Total Bilirubin 0.0 - 1.2 mg/dL 0.3 0.4 -  Alkaline Phos 44 - 121 IU/L 122(H) 136(H) -  AST 0 - 40 IU/L 29 14 -  ALT 0 - 32 IU/L 45(H) 26 -   Lipid Panel     Component Value Date/Time   CHOL 130 04/20/2020 1133   TRIG 95 04/20/2020 1133   HDL 44 04/20/2020 1133   CHOLHDL 3.0 04/20/2020 1133   CHOLHDL 2.7 09/08/2014 0147   VLDL 18 09/08/2014 0147   LDLCALC 68 04/20/2020 1133    CBC    Component Value Date/Time   WBC 7.9 08/25/2020 1116   WBC 10.1 12/30/2019 1530   RBC 5.22 08/25/2020 1116   RBC 4.86 12/30/2019 1530   HGB 15.7 08/25/2020 1116   HCT 46.6 08/25/2020 1116   PLT 282 12/30/2019 1530   PLT 289 04/29/2019 1106   MCV 89 08/25/2020 1116   MCH 30.1 08/25/2020 1116   MCH 29.2 12/30/2019 1530   MCHC 33.7 08/25/2020 1116   MCHC 32.3 12/30/2019 1530   RDW 13.1 08/25/2020 1116   LYMPHSABS 1.8 08/25/2020 1116   MONOABS 0.6 02/18/2018 2337   EOSABS 0.1 08/25/2020 1116   BASOSABS 0.0 08/25/2020 1116    ASSESSMENT AND PLAN: 1. Type 2 diabetes mellitus with morbid  obesity (HCC) A1c is at goal.  However she has had significant weight gain in the past 4 months due to dietary indiscretions.  Dietary counseling given.  Advised her to cut back on portion sizes and to avoid eating heavy meals past 7 PM.  Recommend healthier snacks like fruits or nuts. - POCT glucose (manual entry) - POCT glycosylated hemoglobin (Hb A1C) - Microalbumin / creatinine urine ratio  2. Essential hypertension Blood pressure at goal.  Continue current medications and low-salt diet.  3. Mass of soft tissue of abdomen Likely a pannus.  Will refer for an ultrasound to look at the soft tissue area - US Abdomen Limited; Future  4. Need for immunization against influenza - Flu Vaccine QUAD 78mo+IM (Fluarix, Fluzone &  Alfiuria Quad PF)  Addendum: Patient went to the restroom when she was about to be discharged.  She came out of the restroom and reported to my CMA that she felt weak in her legs and felt like she was about to faint.  Patient was guided back to the exam room.  She told me that while in the bathroom she had some pain in the lower back and in her legs then she felt weak and short of breath like she was about to faint.  Denies any chest pains or palpitations.  Vitals were rechecked.  Blood pressure and pulse were stable.  Blood sugar was 133.  Her chest was clear to auscultation.  Heart was regular.  Power in the lower extremities 5/5 on the right and 4+/5 on the left.  She is chronically weak in the left lower extremity.  She did not eat breakfast before coming out this morning.  Patient given some juice and crackers.  Also given a bottle of Glucerna.  I checked back in with her about 20 minutes later.  She states she was feeling better except a little nauseated.  I offered to send her to the emergency room but patient states she feels that she is able to return home.  Advised to push fluids and be seen in the emergency room if any worsening.  Patient was given the opportunity to ask  questions.  Patient verbalized understanding of the plan and was able to repeat key elements of the plan.   Orders Placed This Encounter  Procedures   US Abdomen Limited   Flu Vaccine QUAD 61mo+IM (Fluarix, Fluzone & Alfiuria Quad PF)   Microalbumin / creatinine urine ratio   POCT glucose (manual entry)   POCT glycosylated hemoglobin (Hb A1C)     Requested Prescriptions    No prescriptions requested or ordered in this encounter    Return in about 4 months (around 08/21/2021).  [name], MD, FACP

## 2021-04-20 NOTE — Patient Instructions (Signed)

## 2021-04-21 LAB — MICROALBUMIN / CREATININE URINE RATIO
Creatinine, Urine: 241.4 mg/dL
Microalb/Creat Ratio: 7 mg/g creat (ref 0–29)
Microalbumin, Urine: 15.7 ug/mL

## 2021-04-25 ENCOUNTER — Other Ambulatory Visit: Payer: Self-pay

## 2021-04-25 ENCOUNTER — Ambulatory Visit (HOSPITAL_COMMUNITY)
Admission: RE | Admit: 2021-04-25 | Discharge: 2021-04-25 | Disposition: A | Discharge status: Home / Self Care | Payer: Medicaid Other | Source: Ambulatory Visit | Attending: Internal Medicine | Admitting: Internal Medicine

## 2021-04-25 DIAGNOSIS — R1900 Intra-abdominal and pelvic swelling, mass and lump, unspecified site: Secondary | ICD-10-CM | POA: Diagnosis not present

## 2021-04-26 ENCOUNTER — Ambulatory Visit
Admission: RE | Admit: 2021-04-26 | Discharge: 2021-04-26 | Disposition: A | Discharge status: Home / Self Care | Payer: Medicaid Other | Source: Ambulatory Visit | Attending: Internal Medicine | Admitting: Internal Medicine

## 2021-04-26 DIAGNOSIS — Z1231 Encounter for screening mammogram for malignant neoplasm of breast: Secondary | ICD-10-CM | POA: Diagnosis not present

## 2021-04-30 ENCOUNTER — Other Ambulatory Visit: Payer: Self-pay | Admitting: Internal Medicine

## 2021-04-30 DIAGNOSIS — S46011D Strain of muscle(s) and tendon(s) of the rotator cuff of right shoulder, subsequent encounter: Secondary | ICD-10-CM

## 2021-04-30 NOTE — Telephone Encounter (Signed)
Requested Prescriptions  Pending Prescriptions Disp Refills  . gabapentin (NEURONTIN) 300 MG capsule [Pharmacy Med Name: GABAPENTIN 300 MG ORAL CAPSULE] 90 capsule 0    Sig: TAKE 1 CAPSULE (300 MG TOTAL) BY MOUTH 3 (THREE) TIMES DAILY.     Neurology: Anticonvulsants - gabapentin Passed - 04/30/2021  2:08 PM      Passed - Valid encounter within last 12 months    Recent Outpatient Visits          1 week ago Type 2 diabetes mellitus with morbid obesity ([place])   [place] [name], MD   2 months ago Poor sleep   [place], Deborah B, MD   4 months ago Type 2 diabetes mellitus with morbid obesity [facility])   [place] [name], MD   8 months ago Type 2 diabetes mellitus with peripheral neuropathy [facility])   [place], MD   1 year ago Class 3 severe obesity due to excess calories with serious comorbidity and body mass index (BMI) of 45.0 to 49.9 in adult [facility])   [place], MD      Future Appointments            In 3 months Wynetta Emery Dalbert Batman, MD [place]

## 2021-05-01 ENCOUNTER — Other Ambulatory Visit: Payer: Self-pay | Admitting: Internal Medicine

## 2021-05-01 DIAGNOSIS — S46011D Strain of muscle(s) and tendon(s) of the rotator cuff of right shoulder, subsequent encounter: Secondary | ICD-10-CM

## 2021-05-01 NOTE — Telephone Encounter (Signed)
Requested Prescriptions  Refused Prescriptions Disp Refills  . PROAIR HFA 108 (90 Base) MCG/ACT inhaler [Pharmacy Med Name: PROAIR HFA 108 (90 BASE) MCG/ACT INHALATION AEROSOL SOLUTION] 8.5 g     Sig: INHALE 2 PUFFS BY MOUTH EVERY 6 (SIX) HOURS AS NEEDED FOR WHEEZING OR SHORTNESS OF BREATH.     Pulmonology:  Beta Agonists Failed - 05/01/2021  3:45 PM      Failed - One inhaler should last at least one month. If the patient is requesting refills earlier, contact the patient to check for uncontrolled symptoms.      Passed - Valid encounter within last 12 months    Recent Outpatient Visits          1 week ago Type 2 diabetes mellitus with morbid obesity ([place])   [place] [name], MD   2 months ago Poor sleep   , MD   4 months ago Type 2 diabetes mellitus with morbid obesity [facility])   [place] [name], MD   8 months ago Type 2 diabetes mellitus with peripheral neuropathy [facility])   [place], MD   1 year ago Class 3 severe obesity due to excess calories with serious comorbidity and body mass index (BMI) of 45.0 to 49.9 in adult [facility])   [place], MD      Future Appointments            In 3 months Wynetta Emery Dalbert Batman, MD [place]

## 2021-05-01 NOTE — Telephone Encounter (Signed)
Rx 12/20/20 #100 1RF (6 month supply) too soon

## 2021-05-01 NOTE — Telephone Encounter (Signed)
Call to pharmacy- Rx for gabapentin received. Requested Prescriptions  Pending Prescriptions Disp Refills  . gabapentin (NEURONTIN) 300 MG capsule [Pharmacy Med Name: GABAPENTIN 300 MG ORAL CAPSULE] 90 capsule 0    Sig: TAKE 1 CAPSULE (300 MG TOTAL) BY MOUTH 3 (THREE) TIMES DAILY.     Neurology: Anticonvulsants - gabapentin Passed - 05/01/2021  2:38 PM      Passed - Valid encounter within last 12 months    Recent Outpatient Visits          1 week ago Type 2 diabetes mellitus with morbid obesity ([place])   [place] [name], MD   2 months ago Poor sleep   [place], Deborah B, MD   4 months ago Type 2 diabetes mellitus with morbid obesity [facility])   [place] [name], MD   8 months ago Type 2 diabetes mellitus with peripheral neuropathy [facility])   [place], MD   1 year ago Class 3 severe obesity due to excess calories with serious comorbidity and body mass index (BMI) of 45.0 to 49.9 in adult [facility])   [place], MD      Future Appointments            In 3 months Wynetta Emery Dalbert Batman, MD [place]           . Cholecalciferol (VITAMIN D3) 10 MCG (400 UNIT) CAPS [Pharmacy Med Name: VITAMIN D3 10 MCG (400 UNIT) ORAL CAPSULE] 100 capsule     Sig: TAKE 1 CAPSULE (400 UNITS TOTAL) BY MOUTH DAILY (AM)     Endocrinology:  Vitamins - Vitamin D Supplementation Failed - 05/01/2021  2:38 PM      Failed - 50,000 IU strengths are not delegated      Failed - Ca in normal range and within 360 days    Calcium  Date Value Ref Range Status  12/19/2020 8.5 (L) 8.7 - 10.2 mg/dL Final   Calcium, Ion  Date Value Ref Range Status  08/18/2015 1.01 (L) 1.12 - 1.23 mmol/L Final         Failed - Phosphate in normal range and within 360 days    Phosphorus   Date Value Ref Range Status  08/11/2015 2.1 (L) 2.5 - 4.6 mg/dL Final         Failed - Vitamin D in normal range and within 360 days    No results found for: [id number], [id number], [id number], 25OHVITD3, 25OHVITD2, 25OHVITD3, 25OHVITD2, 25OHVITD1, 25OHVITD2, 25OHVITD3, VD25OH       Passed - Valid encounter within last 12 months    Recent Outpatient Visits          1 week ago Type 2 diabetes mellitus with morbid obesity ([place])   [place] [name], MD   2 months ago Poor sleep   [place] [name] B, MD   4 months ago Type 2 diabetes mellitus with morbid obesity [facility])   [place] [name] B, MD   8 months ago Type 2 diabetes mellitus with peripheral neuropathy [facility])   [place] [name] B, MD   1 year ago Class 3 severe obesity due to excess calories with serious comorbidity and body mass index (BMI)  of 45.0 to 49.9 in adult [facility])   [place], MD      Future Appointments            In 3 months Wynetta Emery, Dalbert Batman, MD [place]

## 2021-05-02 DIAGNOSIS — F4325 Adjustment disorder with mixed disturbance of emotions and conduct: Secondary | ICD-10-CM | POA: Diagnosis not present

## 2021-05-08 ENCOUNTER — Other Ambulatory Visit: Payer: Self-pay | Admitting: Obstetrics and Gynecology

## 2021-05-08 ENCOUNTER — Other Ambulatory Visit: Payer: Self-pay

## 2021-05-08 NOTE — Patient Instructions (Signed)
Hi Ms. Kelsay, I enjoyed speaking with you today-have a great afternoon!  Ms. Amsden was given information about Medicaid Managed Care team care coordination services as a part of their [place] Medicaid benefit. [name] verbally consented to engagement with the [facility] Managed Care team.   If you are experiencing a medical emergency, please call 911 or report to your local emergency department or urgent care.   If you have a non-emergency medical problem during routine business hours, please contact your provider's office and ask to speak with a nurse.   For questions related to your [facility], please call: [phone] or visit the homepage here: [email or web address]  If you would like to schedule transportation through your [facility], please call the following number at least 2 days in advance of your appointment: [phone].   Call the [place] at [phone], at any time, 24 hours a day, 7 days a week. If you are in danger or need immediate medical attention call 911.  If you would like help to quit smoking, call 1-800-QUIT-NOW 415-220-0898) OR Espaol: 1-855-Djelo-Ya (2-542-706-2376) o para ms informacin haga clic aqu or Text READY to 200-400 to register via text  Ms. Gora - following are the goals we discussed in your visit today:   Goals Addressed             This Visit's Progress    Protect My Health        Timeframe:  Long-Range Goal Priority:  Medium Start Date:    07/14/20                         Expected End Date:  ongoing          Follow Up Date: 06/07/21 - schedule appointment for vaccines needed due to my age or health - schedule recommended health tests (blood work, mammogram, colonoscopy, pap test) - schedule and keep appointment for annual check-up  Update 08/14/20:  patient has scheduled appointment with  PCP 08/21/20 Update 09/11/20:  patient attended appointment with PCP on 08/21/20.  Has appointment scheduled with ENT 09/22/20 due to nose bleeds.  Encouraged patient to schedule appointment with GYN due to bleeding. Update 09/19/20:  Patient states vaginal bleeding has stopped-still needs to schedule appointment with GYN.  Nosebleeds continue daily? More-has appointment 09/22/20 with ENT. Update 10/20/20:  Nose bleeds have stopped-has follow up appointment with Dr. Benjamine Mola on 10/24/20.  Has appointment with Dr. Wynetta Emery for GYN follow up. Update 11/09/20:  Patient attending all appointments-has blood work scheduled next week.  Appointment with Dr. Wynetta Emery 12/19/20. Update 12/07/20:  No new updates today-referral made to [facility] for therapist. Update 01/17/21:  Patient states she has an appointment for therapy. Update 02/14/21:  Patient has therapist appointment 02/27/21, Awaiting appointment with new Psychiatrist. Update 03/14/21:  Patient seen and evaluated by PCP 02/19/21.  Now seeing new Psychiatrist. Update 04/12/21:  Patient has appointment with PCP 04/20/21, has had PNA vaccine and 4th COVID shot.  Will get flu shot when she sees PCP 04/20/21.  Saw Dr. Buddy Duty beginning of October.  Has follow up with neuro in Nov and eye provider in March. Update 05/08/21:  Patient seen and evaluated by PCP 04/20/21, has appointment with neurologist 05/11/21.   The patient verbalized understanding of instructions provided today and declined a print copy of patient instruction materials.   The Managed Medicaid care management team will reach out to  the patient again over the next 30 days.  The  Patient has been provided with contact information for the Managed Medicaid care management team and has been advised to call with any health related questions or concerns.   Aida Raider RN, BSN [place]  [facility] [employer] - Managed Medicaid High Risk [phone].   Following is a copy of your plan  of care:  Care Plan : General Plan of Care (Adult)  Updates made by Gayla Medicus, RN since 05/08/2021 12:00 AM     Problem: Health Promotion or Disease Self-Management (General Plan of Care)   Priority: Medium  Onset Date: 10/20/2020     Long-Range Goal: Self-Management Plan Developed   Start Date: 07/14/2020  Expected End Date: 08/08/2021  Recent Progress: On track  Priority: Medium  Note:    Current Barriers:  Chronic Disease Management support and education needs related to DM, asthma, glaucoma, chronic pain,HTN, migraines Patient with anxiety and depression-has appointment with Psychiatrist  scheduled every 3 months, therapist every 2 weeks-no complaints today Update 12/07/20:  Referral made to [facility] for therapy services. Update 01/17/21:  Patient has appointment scheduled for therapy services, 02/27/21. Update 03/14/21:  Patient continues to see Psychiatrist/therapist.  Has trouble falling asleep.  Discussed sleep hygiene, sleep study. Update 04/12/21:  Patient continues to see Psychiatrist every 3 months, is now seeing therapist at [facility] every 2 weeks-patient states good results.  Patient is sleeping better with set schedule. Patient complaining of nose bleeds and productive cough with green sputum. Patient has appointment with ENT 09/22/20 at 130. Update 09/19/20:  Patient states nose bleeds continue daily-? Increase.  Cough with clear sputum now. Update 10/20/20:  Patient states nose bleeds have improved after seeing Dr. Benjamine Mola, no cough. Patient with increased uterine bleeding-changing pad every 1-2 hours-h/o uterine fibroids.  To schedule appointment with GYN at [facility]. Update 09/19/20:  Patient states uterine bleeding has stopped.  Patient still needs to schedule appointment with GYN. Update 10/20/20:  No bleeding reported-has appointment with Dr. Wynetta Emery for follow up. Patient states she fell out of bed and now having shoulder and neck pain-to see MD  today, 09/11/20. Update 09/19/20:  Patient states shoulder and neck pain have resolved and did not need to see MD-c/o left knee pain-using ice which is helping. Update 10/20/20:  Some knee pain continues-to see provider as needed. Update 12/07/20:  Patient states that shoulder, neck, and knee pain are not a problem right now-does have some back pain and is using heating pad with help.  Nurse Case Manager Clinical Goal(s):  Over the next 90 days, patient will attend all scheduled medical appointments:  Update 09/11/20:  Patient is attending all appointments. Over the next 30 days, patient will work with CM team pharmacist to review medications. Update 08/14/20:  patient met with CM team pharmacist 07/21/20 and is following.  Interventions:  Inter-disciplinary care team collaboration (see longitudinal plan of care) Evaluation of current treatment plan  and patient's adherence to plan as established by provider. Reviewed medications with patient. Collaborated with pharmacy regarding medications. Discussed plans with patient for ongoing care management follow up and provided patient with direct contact information for care management team Reviewed scheduled/upcoming provider appointments. Update 02/14/21:  patient to discuss insomnia with providers as scheduled-improved Collaborated with SW for psychotherapy referral. SW referral for psychotherapy provider. Pharmacy referral for medication review. Update 08/14/20:  Encouraged patient to call PCP for evaluation of nose bleeds and productive  cough. Update 09/11/20:  Patient evaluated by PCP 08/21/20, has appointment with ENT scheduled 09/22/20 at 130.  Patient to also schedule appointment with GYN for uterine bleeding and see MD for shoulder and neck pain. Update 09/19/20:  Shoulder and neck pain improved-doesn't feel like she needs to see MD.  Still needs to schedule appointment with GYN. Update 10/20/20:  Patient has an appointment with Dr. Wynetta Emery for GYN  evaluation. Update 12/07/20:  Referral has been made to [facility] for therapy services-patient awaiting appointment. Update 01/17/21:  Patient states she has appointment for therapy.  Self Care Activities: Over the next 90 days, patient will:  -Self administers medications as prescribed Attends all scheduled provider appointments Calls pharmacy for medication refills Calls provider office for new concerns or questions  Follow Up Plan: The Managed Medicaid care management team will reach out to the patient again over the next 30 days.  The patient has been provided with contact information for the Managed Medicaid care management team and has been advised to call with any health related questions or concerns.   Evidence-based guidance:  Review biopsychosocial determinants of health screens.  Determine level of modifiable health risk.  Assess level of patient activation, level of readiness, importance and confidence to make changes.  Evoke change talk using open-ended questions, pros and cons, as well as looking forward.  Identify areas where behavior change may lead to improved health.  Partner with patient to develop a robust self-management plan that includes lifestyle factors, such as weight loss, exercise and healthy nutrition, as well as goals specific to disease risks.  Support patient and family/caregiver active participation in decision-making and self-management plan.  Implement additional goals and interventions based on identified risk factors to reduce health risk.  Facilitate advance care planning.  Review need for preventive screening based on age, sex, family history and health history.

## 2021-05-08 NOTE — Patient Outreach (Signed)
Medicaid Managed Care   Nurse Care Manager Note  05/08/2021 Name:  Melinda [name] MRN:  [id number] DOB:  August 06, 1965  Melinda [name] is an 55 y.o. year old female who is a primary patient of [name], MD.  The [facility] Managed Care Coordination team was consulted for assistance with:    Chronic healthcare management needs.  Melinda [name] was given information about Medicaid Managed Care Coordination team services today. Melinda [name] Patient agreed to services and verbal consent obtained.  Engaged with patient by telephone for follow up visit in response to provider referral for case management and/or care coordination services.   Assessments/Interventions:  Review of past medical history, allergies, medications, health status, including review of consultants reports, laboratory and other test data, was performed as part of comprehensive evaluation and provision of chronic care management services.  SDOH (Social Determinants of Health) assessments and interventions performed: SDOH Interventions    Flowsheet Row Most Recent Value  SDOH Interventions   Food Insecurity Interventions Intervention Not Indicated       Care Plan  Allergies  Allergen Reactions   Ceftriaxone Anaphylaxis    ROCEPHIN   Metformin     ALL   Penicillins Shortness Of Breath    Has patient had a PCN reaction causing immediate rash, facial/tongue/throat swelling, SOB or lightheadedness with hypotension: Yes Has patient had a PCN reaction causing severe rash involving mucus membranes or skin necrosis: No Has patient had a PCN reaction that required hospitalization No Has patient had a PCN reaction occurring within the last 10 years: No If all of the above answers are "NO", then [name] proceed with Cephalosporin use.    Shellfish Allergy Anaphylaxis   Flonase [Fluticasone Propionate]     Makes migraines worse   Gold-Containing Drug Products     HANDS ITCH   Nickel     HANDS SWELL   Citrus Rash    Medications  Reviewed Today     Reviewed by Melinda Medicus, RN (Registered Nurse) on 05/08/21 at [facility] List Status: <None>   Medication Order Taking? Sig Documenting Provider Last Dose Status Informant  albuterol (PROVENTIL) (2.5 MG/3ML) 0.083% nebulizer solution [id number] No Take 3 mLs (2.5 mg total) by nebulization every 6 (six) hours as needed for wheezing or shortness of breath. [name], MD Taking Active   albuterol (VENTOLIN HFA) 108 (90 Base) MCG/ACT inhaler [id number] No Inhale 2 puffs into the lungs every 6 (six) hours as needed for wheezing or shortness of breath. [name], MD Taking Active   atorvastatin (LIPITOR) 20 MG tablet [id number]  TAKE ONE TABLET BY MOUTH ONCE DAILY AT BEDTIME [name], MD  Active   budesonide-formoterol [facility]) 160-4.5 MCG/ACT inhaler [id number]  Inhale 2 puffs into the lungs 2 (two) times daily. [name], MD  Active   buPROPion (WELLBUTRIN XL) 300 MG 24 hr tablet [id number] No Take 300 mg by mouth daily.  [provider] Taking Active Self  busPIRone (BUSPAR) 5 MG tablet [id number] No Take 10 mg by mouth 2 (two) times daily. [provider] Taking Active Self           Med Note Melinda [name], [facility]   Wed Feb 18, 2018 10:03 PM)    Cholecalciferol (VITAMIN D3) 10 MCG (400 UNIT) tablet [id number]  Take 1 tablet (400 Units total) by mouth daily. [name], MD  Active   citalopram (CELEXA) 40 MG tablet [id number] No Take 40 mg by mouth daily.  [provider] Taking Active Self  cyclobenzaprine (FLEXERIL) 10 MG tablet [id number]  TAKE 1 TABLET (10 MG TOTAL) BY MOUTH 3 (THREE) TIMES DAILY AS NEEDED FOR MUSCLE SPASMS. [name], MD  Active   doxepin (SINEQUAN) 10 MG capsule [id number] No Take 10 mg by mouth. [provider] Taking Active   ELIQUIS 5 MG TABS tablet [id number]  TAKE ONE TABLET BY MOUTH TWICE DAILY MORNING AND EVENING [name], MD  Active   gabapentin (NEURONTIN)  300 MG capsule [id number]  TAKE 1 CAPSULE (300 MG TOTAL) BY MOUTH 3 (THREE) TIMES DAILY. [name], MD  Active   hydrochlorothiazide (MICROZIDE) 12.5 MG capsule [id number]  TAKE ONE CAPSULE BY MOUTH ONCE DAILY(AM) [name], MD  Active   metoprolol tartrate (LOPRESSOR) 50 MG tablet [id number]  TAKE ONE TABLET BY MOUTH TWICE DAILY [name], MD  Active   montelukast (SINGULAIR) 10 MG tablet [id number]  TAKE 1 TABLET BY MOUTH EVERY EVENING AT BEDTIME [name], MD  Active   PAZEO 0.7 % SOLN [id number] No   Patient not taking: No sig reported   [provider] Not Taking Active   prazosin (MINIPRESS) 1 MG capsule [id number] No Take 6 mg by mouth at bedtime. [provider] Taking Active Self  sodium chloride (OCEAN) 0.65 % SOLN nasal spray [id number]  Place 1 spray into both nostrils as needed for congestion. [name], MD  Active   SYNTHROID 200 MCG tablet [id number] No 200 mcg. [provider] Taking Active Self  topiramate (TOPAMAX) 200 MG tablet [id number] No TAKE 1 TABLET (200 MG TOTAL) BY MOUTH AT BEDTIME. [name], DO Taking Active   triamcinolone cream (KENALOG) 0.1 % [id number]  Apply 1 application topically 2 (two) times daily. [name], MD  Active   TRULICITY 4.70 LK/9.5FM Bonney Aid [id number] No Inject 1.5 mg into the skin. [provider] Taking Active Self            Patient Active Problem List   Diagnosis Date Noted   At high risk for falls 04/20/2020   Hyperlipidemia 02/16/2020   Adrenal mass, right ([place]) 04/29/2019   Chronic cough 03/12/2018   Left shoulder pain 10/14/2016   Hypertension 10/14/2016   S/P thyroidectomy 06/27/2016   Chronic bilateral low back pain without sciatica 04/22/2016   Liver lesion 12/24/2015   Controlled type 2 diabetes mellitus with complication, with long-term current use of insulin ([place]) 10/10/2015   Vision loss 08/25/2015   Decreased vision in both eyes 08/25/2015    Prolonged Q-T interval on ECG 08/19/2015   History of pulmonary embolism 08/10/2015   Mild persistent asthma 04/13/2015   Fibroid, uterine    Morbid obesity ([place]) 11/11/2014   Atypical chest pain 11/08/2014   Sinus tachycardia 11/08/2014   Migraine 10/14/2014   Menorrhagia 09/08/2014    Conditions to be addressed/monitored per PCP order:   chronic healthcare management needs, HTN, DM, migraines, chronic pain, glaucoma, anxiety, PTSD, asthma, HLD  Care Plan : General Plan of Care (Adult)  Updates made by Melinda Medicus, RN since 05/08/2021 12:00 AM     Problem: Health Promotion or Disease Self-Management (General Plan of Care)   Priority: Medium  Onset Date: 10/20/2020     Long-Range Goal: Self-Management Plan Developed   Start Date: 07/14/2020  Expected End Date: 08/08/2021  Recent Progress: On track  Priority: Medium  Note:    Current Barriers:  Chronic Disease Management support and education  needs related to DM, asthma, glaucoma, chronic pain,HTN, migraines Patient with anxiety and depression-has appointment with Psychiatrist  scheduled every 3 months, therapist every 2 weeks-no complaints today Update 12/07/20:  Referral made to [facility] for therapy services. Update 01/17/21:  Patient has appointment scheduled for therapy services, 02/27/21. Update 03/14/21:  Patient continues to see Psychiatrist/therapist.  Has trouble falling asleep.  Discussed sleep hygiene, sleep study. Update 04/12/21:  Patient continues to see Psychiatrist every 3 months, is now seeing therapist at [facility] every 2 weeks-patient states good results.  Patient is sleeping better with set schedule. Patient complaining of nose bleeds and productive cough with green sputum. Patient has appointment with ENT 09/22/20 at 130. Update 09/19/20:  Patient states nose bleeds continue daily-? Increase.  Cough with clear sputum now. Update 10/20/20:  Patient states nose bleeds have improved after seeing Dr. Benjamine Mola, no  cough. Patient with increased uterine bleeding-changing pad every 1-2 hours-h/o uterine fibroids.  To schedule appointment with GYN at [facility]. Update 09/19/20:  Patient states uterine bleeding has stopped.  Patient still needs to schedule appointment with GYN. Update 10/20/20:  No bleeding reported-has appointment with Dr. Wynetta Emery for follow up. Patient states she fell out of bed and now having shoulder and neck pain-to see MD today, 09/11/20. Update 09/19/20:  Patient states shoulder and neck pain have resolved and did not need to see MD-c/o left knee pain-using ice which is helping. Update 10/20/20:  Some knee pain continues-to see provider as needed. Update 12/07/20:  Patient states that shoulder, neck, and knee pain are not a problem right now-does have some back pain and is using heating pad with help.  Nurse Case Manager Clinical Goal(s):  Over the next 90 days, patient will attend all scheduled medical appointments:  Update 09/11/20:  Patient is attending all appointments. Over the next 30 days, patient will work with CM team pharmacist to review medications. Update 08/14/20:  patient met with CM team pharmacist 07/21/20 and is following.  Interventions:  Inter-disciplinary care team collaboration (see longitudinal plan of care) Evaluation of current treatment plan  and patient's adherence to plan as established by provider. Reviewed medications with patient. Collaborated with pharmacy regarding medications. Discussed plans with patient for ongoing care management follow up and provided patient with direct contact information for care management team Reviewed scheduled/upcoming provider appointments. Update 02/14/21:  patient to discuss insomnia with providers as scheduled-improved Collaborated with SW for psychotherapy referral. SW referral for psychotherapy provider. Pharmacy referral for medication review. Update 08/14/20:  Encouraged patient to call PCP for evaluation  of nose bleeds and productive cough. Update 09/11/20:  Patient evaluated by PCP 08/21/20, has appointment with ENT scheduled 09/22/20 at 130.  Patient to also schedule appointment with GYN for uterine bleeding and see MD for shoulder and neck pain. Update 09/19/20:  Shoulder and neck pain improved-doesn't feel like she needs to see MD.  Still needs to schedule appointment with GYN. Update 10/20/20:  Patient has an appointment with Dr. Wynetta Emery for GYN evaluation. Update 12/07/20:  Referral has been made to [facility] for therapy services-patient awaiting appointment. Update 01/17/21:  Patient states she has appointment for therapy.  Self Care Activities: Over the next 90 days, patient will:  -Self administers medications as prescribed Attends all scheduled provider appointments Calls pharmacy for medication refills Calls provider office for new concerns or questions  Follow Up Plan: The Managed Medicaid care management team will reach out to the patient again over the next 30 days.  The patient has been provided with contact information for the Managed Medicaid care management team and has been advised to call with any health related questions or concerns.   Evidence-based guidance:  Review biopsychosocial determinants of health screens.  Determine level of modifiable health risk.  Assess level of patient activation, level of readiness, importance and confidence to make changes.  Evoke change talk using open-ended questions, pros and cons, as well as looking forward.  Identify areas where behavior change [name] lead to improved health.  Partner with patient to develop a robust self-management plan that includes lifestyle factors, such as weight loss, exercise and healthy nutrition, as well as goals specific to disease risks.  Support patient and family/caregiver active participation in decision-making and self-management plan.  Implement additional goals and interventions based on identified risk factors  to reduce health risk.  Facilitate advance care planning.  Review need for preventive screening based on age, sex, family history and health history.       Follow Up:  Patient agrees to Care Plan and Follow-up.  Plan: The Managed Medicaid care management team will reach out to the patient again over the next 30 days. and The  Patient has been provided with contact information for the Managed Medicaid care management team and has been advised to call with any health related questions or concerns.  Date/time of next scheduled RN care management/care coordination outreach:  06/07/21 at 1230.

## 2021-05-09 NOTE — Progress Notes (Signed)
NEUROLOGY FOLLOW UP OFFICE NOTE  Melinda [name] [id number]  Assessment/Plan:   1.  Migraine without aura, without status migrainosus, not intractable - increased frequency 2.  Cerebral aneurysm 3.  Subjective unilateral facial numbness.  Off and on over several years.  Previously she has reported right sided facial sensation.  Unclear etiology but do not suspect stroke or other primary neurologic etiology.   1.  Migraine prevention:  Start Aimovig 140mg  every 28 days; continue topiramate 200mg  at bedtime 2.  Migraine rescue: Tylenol 3.  Limit use of pain relievers to no more than 2 days out of week to prevent risk of rebound or medication-overuse headache. 4.  Keep headache diary 5.  May want to follow up with PCP to discuss alternative treatments for allergy. 6.  Repeat CTA of head.  If stable, then would repeat in 5 years. 7.  Follow up 9 months  Subjective:  Melinda [name] is a 55 year old right-handed African-American woman with type 2 diabetes mellitus, glaucoma, asthma, chronic low back pain, PTSD, PE/DVT and history of thyroid cancer status post thyroidectomy who follows up for migraines.   UPDATE: CTA head on 06/09/2020 personally reviewed revealed 1.5 mm aneurysm vs infundibulum  arising from a tortuous right posterior cerebral artery, stable.   She has been wearing new glasses for about a year.  She is being followed by the eye doctor for glaucoma.   Migraines remain well-controlled. Intensity:  Moderate Duration:  1 hour Frequency:  3 days per month Frequency of abortive medication: Has not been treating migraines. Current NSAIDS:  Contraindicated (on anticoagulation) Current analgesics:  Tramadol Current triptans:  none Current ergotamine:  none Current anti-emetic:  Zofran ODT 4mg  Current muscle relaxants:  Flexeril Current anti-anxiolytic:  none Current sleep aide:  none Current Antihypertensive medications:  Metoprolol 50mg  twice daily, HCTZ, prazosin Current  Antidepressant medications:  Wellbutrin 300mg , citalopram 40mg , doxepin 10mg  Current Anticonvulsant medications:  topiramate 200 mg at bedtime, gabapentin 300mg  three times daily Current anti-CGRP:  None Current Vitamins/Herbal/Supplements:  Ferrous sulfate Current Antihistamines/Decongestants:  Claritin, Benadryl Other therapy:  Essential oils, lavender  Sees psychiatrist and psychologist which helps   Caffeine: No Alcohol: No Smoker: No Diet: Hydrates.  No soda.  Eats baked and boiled chicken, mostly vegetables.  Little red meat only during menses.  No fried foods. Exercise: Stationary bike, limited due to chronic pain Depression: Yes; Anxiety: Yes Other pain: Chronic pain Sleep hygiene: Poor.  Has night terrors secondary to PTSD.  Sleep study last week showed no sleep apnea.     HISTORY: Onset:  Since her 86s Location:  Right sided Quality:  squeezing Initial Intensity:  8.5/10 (sometimes 10/10) Aura:  Sometimes preceded with visual aura (sees a pinwheel) Prodrome:  no Associated symptoms: Nausea, photophobia, phonophobia.  No worse headache of life, change in quality, wakes her up from sleep. Initial Duration:  3 hours to all day Initial Frequency:  daily Triggers:  Apples, aged cheese, menstrual cycle Relieving factors:  None Activity:  Light activity aggravates   Past NSAIDS:  ibuprofen Past analgesics:  Excedrin Past abortive triptans:  Sumatriptan tablet/NS/[place], rizatriptan 10mg  Past muscle relaxants:  no Past anti-emetic:  no Past antihypertensive medications:  no Past antidepressant medications:  amitriptyline 50mg  Past anticonvulsant medications:  no Past vitamins/Herbal/Supplements:  no Past antihistamines/decongestants:  no Other past therapies:  no   Family history of headache:  Son has migraines.   CT of head from 01/16/15 was unremarkable.   In September 2018, she developed a "thunderclap"  headache.  Onset was over a month ago.  It lasts for a couple of  seconds and occurs once a week.  She has not had any vision loss, unilateral numbness or weakness or vertigo.   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 ([place]) AGE 48   HAD TO LEARN TO WALK AGAIN   Iron deficiency anemia due to chronic blood loss 09/09/2014   Low TSH level 09/08/2014   Lumbar herniated disc    Menorrhagia 09/08/2014   Migraine    CLUSTER AND MIGRAINES   Multiple thyroid nodules    Ovarian mass, right 09/09/2014   PE (pulmonary embolism) 2016   Peripheral neuropathy    FINGER TIPS AND TOES NUMB SOME   Preeclampsia  1983   WITH PREGNANCY   PTSD (post-traumatic stress disorder)    Scoliosis    Seizures (HCC) AGE 32   NONE SINCE, HAD CHICKEN POX THEN    MEDICATIONS: Current Outpatient Medications on File Prior to Visit  Medication Sig Dispense Refill   Cholecalciferol (VITAMIN D3) 10 MCG (400 UNIT) tablet Take 1 tablet (400 Units total) by mouth daily. 100 tablet 1   albuterol (PROVENTIL) (2.5 MG/3ML) 0.083% nebulizer solution Take 3 mLs (2.5 mg total) by nebulization every 6 (six) hours as needed for wheezing or shortness of breath. 3 mL 3   albuterol (VENTOLIN HFA) 108 (90 Base) MCG/ACT inhaler  Inhale 2 puffs into the lungs every 6 (six) hours as needed for wheezing or shortness of breath. 8 g 2   atorvastatin (LIPITOR) 20 MG tablet TAKE ONE TABLET BY MOUTH ONCE DAILY AT BEDTIME 30 tablet 2   budesonide-formoterol (SYMBICORT) 160-4.5 MCG/ACT inhaler Inhale 2 puffs into the lungs 2 (two) times daily. 2 each 6   buPROPion (WELLBUTRIN XL) 300 MG 24 hr tablet Take 300 mg by mouth daily.   2   busPIRone (BUSPAR) 5 MG tablet Take 10 mg by mouth 2 (two) times daily.     citalopram (CELEXA) 40 MG tablet Take 40 mg by mouth daily.   2   cyclobenzaprine (FLEXERIL) 10 MG tablet TAKE 1 TABLET (10 MG TOTAL) BY MOUTH 3 (THREE) TIMES DAILY AS NEEDED FOR MUSCLE SPASMS. 30 tablet 2   doxepin (SINEQUAN) 10 MG capsule Take 10 mg by mouth.     ELIQUIS 5 MG TABS tablet TAKE ONE TABLET BY MOUTH TWICE DAILY MORNING AND EVENING 60 tablet 2   gabapentin (NEURONTIN) 300 MG capsule TAKE 1 CAPSULE (300 MG TOTAL) BY MOUTH 3 (THREE) TIMES DAILY. 90 capsule 0   hydrochlorothiazide (MICROZIDE) 12.5 MG capsule TAKE ONE CAPSULE BY MOUTH ONCE DAILY(AM) 30 capsule  2   metoprolol tartrate (LOPRESSOR) 50 MG tablet TAKE ONE TABLET BY MOUTH TWICE DAILY 60 tablet 2   montelukast (SINGULAIR) 10 MG tablet TAKE 1 TABLET BY MOUTH EVERY EVENING AT BEDTIME 30 tablet 2   PAZEO 0.7 % SOLN  (Patient not taking: No sig reported)     prazosin (MINIPRESS) 1 MG capsule Take 6 mg by mouth at bedtime.     sodium chloride (OCEAN) 0.65 % SOLN nasal spray Place 1 spray into both nostrils as needed for congestion. 30 mL 0   SYNTHROID 200 MCG tablet 200 mcg.     topiramate (TOPAMAX) 200 MG tablet TAKE 1 TABLET (200 MG TOTAL) BY MOUTH AT BEDTIME. 90 tablet 3   triamcinolone cream (KENALOG) 0.1 % Apply 1 application topically 2 (two) times daily. 30 g 0   TRULICITY 5.39 JQ/7.3AL SOPN Inject 1.5 mg into the skin.     No current facility-administered medications on file prior to visit.    ALLERGIES: Allergies  Allergen Reactions   Ceftriaxone  Anaphylaxis    ROCEPHIN   Metformin     ALL   Penicillins Shortness Of Breath    Has patient had a PCN reaction causing immediate rash, facial/tongue/throat swelling, SOB or lightheadedness with hypotension: Yes Has patient had a PCN reaction causing severe rash involving mucus membranes or skin necrosis: No Has patient had a PCN reaction that required hospitalization No Has patient had a PCN reaction occurring within the last 10 years: No If all of the above answers are "NO", then may proceed with Cephalosporin use.    Shellfish Allergy Anaphylaxis   Flonase [Fluticasone Propionate]     Makes migraines worse   Gold-Containing Drug Products     HANDS ITCH   Nickel     HANDS SWELL   Citrus Rash    FAMILY HISTORY: Family History  Problem Relation Age of Onset   Diabetes Mother    Breast cancer Mother    CAD Mother    Hypertension Mother    Alcohol abuse Father    Hypertension Father    Breast cancer Maternal Aunt    Breast cancer Maternal Aunt       Objective:  Blood pressure 107/67, pulse (!) 101, height 5\' 10"  (1.778 m), weight (!) 344 lb 3.2 oz (156.1 kg), SpO2 91 %. General: No acute distress.  Patient appears well-groomed.   Head:  Normocephalic/atraumatic Eyes:  Fundi examined but not visualized Neck: supple, no paraspinal tenderness, full range of motion Heart:  Regular rate and rhythm Lungs:  Clear to auscultation bilaterally Back: No paraspinal tenderness Neurological Exam: alert and oriented to person, place, and time.  Speech fluent and not dysarthric, language intact.  CN II-XII intact. Bulk and tone normal, muscle strength 5/5 throughout.  Sensation to light touch intact.  Deep tendon reflexes 2+ throughout, toes downgoing.  Finger to nose testing intact.  Gait normal,.   Metta Clines, DO  CC: [name], MD

## 2021-05-11 ENCOUNTER — Emergency Department (HOSPITAL_COMMUNITY): Payer: Medicaid Other

## 2021-05-11 ENCOUNTER — Emergency Department (HOSPITAL_COMMUNITY)
Admission: EM | Admit: 2021-05-11 | Discharge: 2021-05-12 | Disposition: A | Discharge status: Home / Self Care | Payer: Medicaid Other | Attending: Emergency Medicine | Admitting: Emergency Medicine

## 2021-05-11 ENCOUNTER — Ambulatory Visit: Payer: Medicaid Other | Admitting: Neurology

## 2021-05-11 ENCOUNTER — Other Ambulatory Visit: Payer: Self-pay

## 2021-05-11 ENCOUNTER — Encounter: Payer: Self-pay | Admitting: Neurology

## 2021-05-11 VITALS — BP 107/67 | HR 101 | Ht 70.0 in | Wt 344.2 lb

## 2021-05-11 DIAGNOSIS — J449 Chronic obstructive pulmonary disease, unspecified: Secondary | ICD-10-CM | POA: Diagnosis not present

## 2021-05-11 DIAGNOSIS — R29818 Other symptoms and signs involving the nervous system: Secondary | ICD-10-CM | POA: Diagnosis not present

## 2021-05-11 DIAGNOSIS — M4316 Spondylolisthesis, lumbar region: Secondary | ICD-10-CM | POA: Diagnosis not present

## 2021-05-11 DIAGNOSIS — E1142 Type 2 diabetes mellitus with diabetic polyneuropathy: Secondary | ICD-10-CM | POA: Insufficient documentation

## 2021-05-11 DIAGNOSIS — Z87891 Personal history of nicotine dependence: Secondary | ICD-10-CM | POA: Insufficient documentation

## 2021-05-11 DIAGNOSIS — R531 Weakness: Secondary | ICD-10-CM | POA: Diagnosis not present

## 2021-05-11 DIAGNOSIS — Z20822 Contact with and (suspected) exposure to covid-19: Secondary | ICD-10-CM | POA: Diagnosis not present

## 2021-05-11 DIAGNOSIS — I6782 Cerebral ischemia: Secondary | ICD-10-CM | POA: Diagnosis not present

## 2021-05-11 DIAGNOSIS — Z7901 Long term (current) use of anticoagulants: Secondary | ICD-10-CM

## 2021-05-11 DIAGNOSIS — I671 Cerebral aneurysm, nonruptured: Secondary | ICD-10-CM | POA: Diagnosis not present

## 2021-05-11 DIAGNOSIS — M549 Dorsalgia, unspecified: Secondary | ICD-10-CM | POA: Diagnosis not present

## 2021-05-11 DIAGNOSIS — Z79899 Other long term (current) drug therapy: Secondary | ICD-10-CM | POA: Insufficient documentation

## 2021-05-11 DIAGNOSIS — R2990 Unspecified symptoms and signs involving the nervous system: Secondary | ICD-10-CM

## 2021-05-11 DIAGNOSIS — Z859 Personal history of malignant neoplasm, unspecified: Secondary | ICD-10-CM | POA: Insufficient documentation

## 2021-05-11 DIAGNOSIS — R000 Tachycardia, unspecified: Secondary | ICD-10-CM | POA: Diagnosis not present

## 2021-05-11 DIAGNOSIS — J4530 Mild persistent asthma, uncomplicated: Secondary | ICD-10-CM | POA: Diagnosis not present

## 2021-05-11 DIAGNOSIS — N9489 Other specified conditions associated with female genital organs and menstrual cycle: Secondary | ICD-10-CM | POA: Diagnosis not present

## 2021-05-11 DIAGNOSIS — M2578 Osteophyte, vertebrae: Secondary | ICD-10-CM | POA: Diagnosis not present

## 2021-05-11 DIAGNOSIS — M5442 Lumbago with sciatica, left side: Secondary | ICD-10-CM | POA: Insufficient documentation

## 2021-05-11 DIAGNOSIS — M545 Low back pain, unspecified: Secondary | ICD-10-CM | POA: Diagnosis not present

## 2021-05-11 DIAGNOSIS — R0902 Hypoxemia: Secondary | ICD-10-CM | POA: Diagnosis not present

## 2021-05-11 DIAGNOSIS — I6523 Occlusion and stenosis of bilateral carotid arteries: Secondary | ICD-10-CM | POA: Diagnosis not present

## 2021-05-11 DIAGNOSIS — I10 Essential (primary) hypertension: Secondary | ICD-10-CM | POA: Insufficient documentation

## 2021-05-11 DIAGNOSIS — G43009 Migraine without aura, not intractable, without status migrainosus: Secondary | ICD-10-CM | POA: Diagnosis not present

## 2021-05-11 DIAGNOSIS — I629 Nontraumatic intracranial hemorrhage, unspecified: Secondary | ICD-10-CM | POA: Diagnosis not present

## 2021-05-11 DIAGNOSIS — Q673 Plagiocephaly: Secondary | ICD-10-CM | POA: Diagnosis not present

## 2021-05-11 LAB — CBG MONITORING, ED: Glucose-Capillary: 125 mg/dL — ABNORMAL HIGH (ref 70–99)

## 2021-05-11 LAB — CBC
HCT: 44.5 % (ref 36.0–46.0)
Hemoglobin: 14.6 g/dL (ref 12.0–15.0)
MCH: 30.5 pg (ref 26.0–34.0)
MCHC: 32.8 g/dL (ref 30.0–36.0)
MCV: 93.1 fL (ref 80.0–100.0)
Platelets: 237 10*3/uL (ref 150–400)
RBC: 4.78 MIL/uL (ref 3.87–5.11)
RDW: 14.1 % (ref 11.5–15.5)
WBC: 10.5 10*3/uL (ref 4.0–10.5)
nRBC: 0 % (ref 0.0–0.2)

## 2021-05-11 LAB — PROTIME-INR
INR: 1.1 (ref 0.8–1.2)
Prothrombin Time: 13.7 seconds (ref 11.4–15.2)

## 2021-05-11 LAB — COMPREHENSIVE METABOLIC PANEL
ALT: 27 U/L (ref 0–44)
AST: 19 U/L (ref 15–41)
Albumin: 3.6 g/dL (ref 3.5–5.0)
Alkaline Phosphatase: 91 U/L (ref 38–126)
Anion gap: 9 (ref 5–15)
BUN: 10 mg/dL (ref 6–20)
CO2: 26 mmol/L (ref 22–32)
Calcium: 8.7 mg/dL — ABNORMAL LOW (ref 8.9–10.3)
Chloride: 107 mmol/L (ref 98–111)
Creatinine, Ser: 1.06 mg/dL — ABNORMAL HIGH (ref 0.44–1.00)
GFR, Estimated: >60 mL/min (ref >60)
Glucose, Bld: 123 mg/dL — ABNORMAL HIGH (ref 70–99)
Potassium: 3.5 mmol/L (ref 3.5–5.1)
Sodium: 142 mmol/L (ref 135–145)
Total Bilirubin: 0.8 mg/dL (ref 0.3–1.2)
Total Protein: 7 g/dL (ref 6.5–8.1)

## 2021-05-11 LAB — DIFFERENTIAL
Abs Immature Granulocytes: 0.1 10*3/uL — ABNORMAL HIGH (ref 0.00–0.07)
Basophils Absolute: 0 10*3/uL (ref 0.0–0.1)
Basophils Relative: 0 %
Eosinophils Absolute: 0.1 10*3/uL (ref 0.0–0.5)
Eosinophils Relative: 1 %
Immature Granulocytes: 1 %
Lymphocytes Relative: 22 %
Lymphs Abs: 2.3 10*3/uL (ref 0.7–4.0)
Monocytes Absolute: 0.6 10*3/uL (ref 0.1–1.0)
Monocytes Relative: 6 %
Neutro Abs: 7.3 10*3/uL (ref 1.7–7.7)
Neutrophils Relative %: 70 %

## 2021-05-11 LAB — I-STAT CHEM 8, ED
BUN: 12 mg/dL (ref 6–20)
Calcium, Ion: 1.02 mmol/L — ABNORMAL LOW (ref 1.15–1.40)
Chloride: 110 mmol/L (ref 98–111)
Creatinine, Ser: 1.1 mg/dL — ABNORMAL HIGH (ref 0.44–1.00)
Glucose, Bld: 120 mg/dL — ABNORMAL HIGH (ref 70–99)
HCT: 44 % (ref 36.0–46.0)
Hemoglobin: 15 g/dL (ref 12.0–15.0)
Potassium: 3.5 mmol/L (ref 3.5–5.1)
Sodium: 143 mmol/L (ref 135–145)
TCO2: 23 mmol/L (ref 22–32)

## 2021-05-11 LAB — RESP PANEL BY RT-PCR (FLU A&B, COVID) ARPGX2
Influenza A by PCR: NEGATIVE
Influenza B by PCR: NEGATIVE
SARS Coronavirus 2 by RT PCR: NEGATIVE

## 2021-05-11 LAB — ETHANOL: Alcohol, Ethyl (B): <10 mg/dL (ref <10)

## 2021-05-11 LAB — APTT: aPTT: 29 seconds (ref 24–36)

## 2021-05-11 MED ORDER — HYDROCHLOROTHIAZIDE 12.5 MG PO CAPS
12.5000 mg | ORAL_CAPSULE | Freq: Every day | ORAL | Status: DC
  Filled 2021-05-11: qty 1

## 2021-05-11 MED ORDER — METOPROLOL TARTRATE 25 MG PO TABS: 50.0000 mg | ORAL_TABLET | Freq: Two times a day (BID) | ORAL | Status: DC

## 2021-05-11 MED ORDER — CITALOPRAM HYDROBROMIDE 10 MG PO TABS
40.0000 mg | ORAL_TABLET | Freq: Every day | ORAL | Status: DC
  Administered 2021-05-11: 40 mg via ORAL
  Filled 2021-05-11: qty 4

## 2021-05-11 MED ORDER — BUSPIRONE HCL 10 MG PO TABS: 10.0000 mg | ORAL_TABLET | Freq: Two times a day (BID) | ORAL | Status: DC

## 2021-05-11 MED ORDER — AIMOVIG 140 MG/ML ~~LOC~~ SOAJ: 140.0000 mg | SUBCUTANEOUS | 5 refills | Status: DC

## 2021-05-11 MED ORDER — GABAPENTIN 300 MG PO CAPS
300.0000 mg | ORAL_CAPSULE | Freq: Three times a day (TID) | ORAL | Status: DC
  Administered 2021-05-11: 300 mg via ORAL
  Filled 2021-05-11: qty 1

## 2021-05-11 MED ORDER — LORAZEPAM 2 MG/ML IJ SOLN
1.0000 mg | Freq: Once | INTRAMUSCULAR | Status: AC
  Administered 2021-05-11: 1 mg via INTRAVENOUS
  Filled 2021-05-11: qty 1

## 2021-05-11 MED ORDER — OXYCODONE HCL 5 MG PO TABS: 10.0000 mg | ORAL_TABLET | Freq: Once | ORAL | Status: DC

## 2021-05-11 MED ORDER — APIXABAN 5 MG PO TABS: 5.0000 mg | ORAL_TABLET | Freq: Two times a day (BID) | ORAL | Status: DC

## 2021-05-11 MED ORDER — IOHEXOL 350 MG/ML SOLN
75.0000 mL | Freq: Once | INTRAVENOUS | Status: AC | PRN
  Administered 2021-05-11: 75 mL via INTRAVENOUS

## 2021-05-11 NOTE — ED Triage Notes (Addendum)
Pt from neurology appointment via EMS-pt stood up to leave appointment and had severe lower back pain with radiation down legs that prohibited her from walking, so EMS was called. On exam patient has new L leg and R arm weakness. Code stroke called in triage.

## 2021-05-11 NOTE — Progress Notes (Signed)
MRI Brain is negative for acute stroke. Okay to resume Eliquis. Please follow up on earlier full neurology consult for full recommendations. I have not seen this patient, just reviewed her MRI Brain.  [place] Pager Number [phone]

## 2021-05-11 NOTE — Progress Notes (Signed)
Before the patient checked out, pt was found leaned over on the check out counter.  Pt vitals take Neuro exam done again by LPN Heather as well as Dr.Jaffe.  Base BP at the start if the visit 107/67 left arm thigh  cuff, pulse 101, o2: 91, BP taken at the time she was found at the check out 104/63 P:98 OZ given to pt at that time O2: 93.   BP retaken 5-10 minutes later 101/72 P;87 O2:97 with oxygen.  Pt c/o pain in the left side of her ribs, SOB, Grip of the Right hand became weaker pt unable to pick up her left leg at all after the pain from her lower back.   EMS called to take Patient to the ED.   Patient agreed to go to the ED.

## 2021-05-11 NOTE — ED Provider Notes (Signed)
[facility] EMERGENCY DEPARTMENT Provider Note   CSN: [id number] Arrival date & time: 05/11/21  1359  An emergency department physician performed an initial assessment on this suspected stroke patient at 1431.  History Chief Complaint  Patient presents with   Back Pain    Melinda [name] is a 55 y.o. female patient with a significant medical history presents to the emergency department with acute onset low back pain and left leg weakness.  She is coming from her neurologist office where she was being evaluated for ongoing chronic migraines when she had the acute episode.  Upon arrival to the emergency department triage provider found her to have left leg weakness and some right upper extremity weakness.  Given timing and patient's endorsement of acuity code stroke was called.  Patient evaluated by neuro hospitalist team who suspects her left leg weakness is secondary to pain and sciatica.  Patient initially denied this however does have a documented history.  She takes gabapentin for this.  Patient also endorses right shoulder pain ongoing for numerous weeks causing some weakness in her right upper extremity.  She denies headache today, vision changes, slurred speech, other weakness.  Denies numbness.  Does endorse some paresthesias down the left leg in association with the back pain.  This specifically worsened with palpation of the left SI joint, left buttock or utilization of the left leg.  No other aggravating or alleviating factors.  No treatments prior to arrival.  Patient reports mechanical fall in June 2022 but none since that time.  No other known trauma. Does use a walker 2/2 to chronic balance issues of unknown etiology.  The history is provided by the patient, medical records and the EMS personnel. No language interpreter was used.      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 ([place]) AGE 77   HAD TO LEARN TO WALK AGAIN   Iron deficiency anemia due to chronic blood loss 09/09/2014   Low TSH level 09/08/2014   Lumbar herniated disc    Menorrhagia 09/08/2014   Migraine    CLUSTER AND MIGRAINES   Multiple thyroid nodules    Ovarian mass, right 09/09/2014   PE (pulmonary embolism) 2016   Peripheral neuropathy    FINGER TIPS AND TOES NUMB SOME   Preeclampsia  1983   WITH PREGNANCY   PTSD (post-traumatic stress disorder)    Scoliosis    Seizures ([place]) AGE 86   NONE SINCE, HAD CHICKEN POX THEN    Patient Active Problem List   Diagnosis Date Noted   At high risk for falls 04/20/2020   Hyperlipidemia 02/16/2020   Adrenal mass, right ([place]) 04/29/2019   Chronic cough 03/12/2018   Left shoulder pain 10/14/2016   Hypertension 10/14/2016   S/P thyroidectomy 06/27/2016   Chronic bilateral low back pain without sciatica 04/22/2016   Liver lesion  12/24/2015   Controlled type 2 diabetes mellitus with complication, with long-term current use of insulin ([place]) 10/10/2015   Vision loss 08/25/2015   Decreased vision in both eyes 08/25/2015   Prolonged Q-T interval on ECG 08/19/2015   History of pulmonary embolism 08/10/2015   Mild persistent asthma 04/13/2015   Fibroid, uterine    Morbid obesity ([place]) 11/11/2014   Atypical chest pain 11/08/2014   Sinus tachycardia 11/08/2014   Migraine 10/14/2014   Menorrhagia 09/08/2014    Past Surgical History:  Procedure Laterality Date   ANGIOGRAM TO LEG  08-13-15   RIGHT   CHOLECYSTECTOMY      IR GENERIC HISTORICAL  04/04/2016   IR RADIOLOGIST EVAL & MGMT 04/04/2016 [name], MD [facility] INTERV RAD   THYROIDECTOMY N/A 11/17/2015   Procedure: TOTAL THYROIDECTOMY;  Surgeon: Armandina Gemma, MD;  Location: [facility]S;  Service: General;  Laterality: N/A;   UTERINE ARTERY EMBOLIZATION Bilateral 08/13/2015     OB History     Gravida  1   Para  1   Term      Preterm  1   AB      Living  1      SAB      IAB      Ectopic      Multiple      Live Births              Family History  Problem Relation Age of Onset   Diabetes Mother    Breast cancer Mother    CAD Mother    Hypertension Mother    Alcohol abuse Father    Hypertension Father    Breast cancer Maternal Aunt    Breast cancer Maternal Aunt     Social History   Tobacco Use   Smoking status: Former    Packs/day: 0.50    Years: 40.00    Pack years: 20.00    Types: Cigarettes    Quit date: 11/03/2014    Years since quitting: 6.5   Smokeless tobacco: Never  Vaping Use   Vaping Use: Never used  Substance Use Topics   Alcohol use: No    Alcohol/week: 0.0 standard drinks   Drug use: No    Home Medications Prior to Admission medications   Medication Sig Start Date End Date Taking? Authorizing Provider  albuterol (PROVENTIL) (2.5 MG/3ML) 0.083% nebulizer solution Take 3 mLs (2.5 mg total) by nebulization every 6 (six) hours as needed for wheezing or shortness of breath. 12/19/20  Yes [name], MD  albuterol (VENTOLIN HFA) 108 (90 Base) MCG/ACT inhaler Inhale 2 puffs into the lungs every 6 (six) hours as needed for wheezing or shortness of breath. 12/19/20  Yes [name], MD  atorvastatin (LIPITOR) 20 MG tablet TAKE ONE TABLET BY MOUTH ONCE DAILY AT BEDTIME Patient taking differently: Take 20 mg by mouth at bedtime. 03/02/21  Yes [name], MD  budesonide-formoterol [facility]) 160-4.5 MCG/ACT inhaler Inhale 2 puffs into the lungs 2 (two) times daily. 12/19/20  Yes [name], MD  buPROPion (WELLBUTRIN XL) 300 MG 24 hr tablet Take 300 mg by mouth daily.  09/12/17  Yes [provider]  busPIRone (BUSPAR) 5 MG tablet Take 10 mg by mouth 2 (two) times daily.   Yes [provider]  Cholecalciferol (VITAMIN D3) 10 MCG (400 UNIT) tablet Take 1 tablet (400 Units total) by mouth daily. 12/20/20  Yes [name], MD  citalopram (CELEXA) 40 MG  tablet Take 40 mg by mouth daily.  12/01/17  Yes [provider]  cyclobenzaprine (FLEXERIL) 10 MG tablet TAKE 1 TABLET (10 MG TOTAL) BY MOUTH 3 (THREE) TIMES DAILY AS NEEDED FOR MUSCLE SPASMS. 02/01/21  Yes [name], MD  doxepin (SINEQUAN) 10 MG capsule Take 10 mg by mouth.   Yes [provider]  ELIQUIS 5 MG TABS tablet TAKE ONE TABLET BY MOUTH TWICE DAILY MORNING AND EVENING Patient taking differently: Take 5 mg by mouth 2 (two) times daily. 03/02/21  Yes [name], MD  gabapentin (NEURONTIN) 300 MG capsule TAKE 1 CAPSULE (300 MG TOTAL) BY MOUTH 3 (THREE) TIMES DAILY. 04/30/21  Yes [name], MD  hydrochlorothiazide (MICROZIDE) 12.5 MG capsule TAKE ONE CAPSULE BY MOUTH ONCE DAILY(AM) Patient taking differently: Take 12.5 mg by mouth daily. 03/02/21  Yes [name], MD  metoprolol tartrate (LOPRESSOR) 50 MG tablet TAKE ONE TABLET BY MOUTH TWICE DAILY Patient taking differently: Take 50 mg by mouth 2 (two) times daily. 03/02/21  Yes [name], MD  montelukast (SINGULAIR) 10 MG tablet TAKE 1 TABLET BY MOUTH EVERY EVENING AT BEDTIME Patient taking differently: Take 10 mg by mouth at bedtime. 03/02/21  Yes [name], MD  prazosin (MINIPRESS) 1 MG capsule Take 6 mg by mouth at bedtime.   Yes [provider]  sodium chloride (OCEAN) 0.65 % SOLN nasal spray Place 1 spray into both nostrils as needed for congestion. 08/21/20  Yes [name], MD  SYNTHROID 200 MCG tablet Take 200 mcg by mouth daily before breakfast. 10/29/18  Yes [provider]  topiramate (TOPAMAX) 200 MG tablet TAKE 1 TABLET (200 MG TOTAL) BY MOUTH AT BEDTIME. 07/11/20  Yes Jaffe, Adam R, DO  triamcinolone cream (KENALOG) 0.1 % Apply 1 application topically 2 (two) times daily. Patient taking differently: Apply 1 application topically daily as needed (For rash). 12/19/20  Yes [name], MD  TRULICITY 5.40 JW/1.1BJ SOPN Inject 1.5 mg into the skin once a week. 04/04/20  Yes [provider]  Erenumab-aooe (AIMOVIG) 140 MG/ML SOAJ Inject 140 mg into the skin every 28 (twenty-eight) days. Patient not taking: Reported on 05/11/2021 05/11/21   [name], DO  PAZEO 0.7 % SOLN  08/25/18   [provider]    Allergies    Ceftriaxone, Metformin, Penicillins, Shellfish allergy, Flonase [fluticasone propionate], Gold-containing drug products, Nickel, and Citrus  Review of Systems   Review of Systems  Constitutional:  Negative for appetite change, diaphoresis, fatigue, fever and unexpected weight change.  HENT:  Negative for mouth sores.   Eyes:  Negative for visual disturbance.  Respiratory:  Negative for cough, chest tightness, shortness of breath and wheezing.   Cardiovascular:  Negative for chest pain.  Gastrointestinal:  Negative for abdominal pain, constipation, diarrhea, nausea and vomiting.  Endocrine: Negative for polydipsia, polyphagia and polyuria.  Genitourinary:  Negative for dysuria, frequency, hematuria and urgency.  Musculoskeletal:  Positive for arthralgias (right shoulder) and back pain. Negative for neck stiffness.  Skin:  Negative for rash.  Allergic/Immunologic: Negative for immunocompromised state.  Neurological:  Positive for weakness (left leg). Negative for syncope, light-headedness and headaches.  Hematological:  Does not bruise/bleed easily.  Psychiatric/Behavioral:  Negative for sleep disturbance. The patient is not nervous/anxious.    Physical Exam Updated Vital Signs BP 123/82 (BP Location: Right  Arm)   Pulse 83   Temp 98.6 F (37 C) (Oral)   Resp 16  SpO2 96%   Physical Exam Vitals and nursing note reviewed.  Constitutional:      General: She is not in acute distress.    Appearance: She is well-developed. She is obese. She is not diaphoretic.  HENT:     Head: Normocephalic and atraumatic.     Nose: Nose normal.     Mouth/Throat:     Mouth: Mucous membranes are moist.  Eyes:     General: No scleral icterus.    Conjunctiva/sclera: Conjunctivae normal.     Pupils: Pupils are equal, round, and reactive to light.     Comments: No horizontal, vertical or rotational nystagmus  Neck:     Comments: Full active and passive ROM without pain No midline or paraspinal tenderness No nuchal rigidity or meningeal signs Cardiovascular:     Rate and Rhythm: Normal rate and regular rhythm.  Pulmonary:     Effort: Pulmonary effort is normal. No respiratory distress.     Breath sounds: No wheezing or rales.     Comments: Congested cough Abdominal:     General: There is no distension.     Palpations: Abdomen is soft.     Tenderness: There is no abdominal tenderness. There is no guarding or rebound.  Musculoskeletal:     Right shoulder: Tenderness present. Decreased range of motion.     Cervical back: Normal, normal range of motion and neck supple.     Thoracic back: Normal.     Lumbar back: Tenderness present. No signs of trauma. Decreased range of motion.     Comments: Tenderness to palpation of the right shoulder. TTP along the left paraspinal muscles, L SI joint.  She is able to lift the right leg from sitting, but unable to lift the left leg from a sitting position 2/2 to reported pain.  Lymphadenopathy:     Cervical: No cervical adenopathy.  Skin:    General: Skin is warm and dry.     Findings: No rash.  Neurological:     Mental Status: She is alert and oriented to person, place, and time.     Cranial Nerves: No cranial nerve deficit.     Motor: No abnormal muscle tone.      Coordination: Coordination normal.     Comments: Mental Status:  Alert, oriented, thought content appropriate. Speech fluent without evidence of aphasia. Able to follow 2 step commands without difficulty.  Cranial Nerves:  II:  Peripheral visual fields grossly normal, pupils equal, round, reactive to light III,IV, VI: ptosis not present, extra-ocular motions intact bilaterally  V,VII: smile symmetric, facial light touch sensation equal VIII: hearing grossly normal bilaterally  IX,X: midline uvula rise  XI: bilateral shoulder shrug equal and strong XII: midline tongue extension  Motor:  5/5 in bilateral upper extremities including strong and equal grip strength  5/5 in the RLE, 4/5 in the LLE while pushing against resistance in a sitting position; 5/5 in BLE with dorsiflexion and plantar flexion Sensory:  light touch normal in all extremities.  Cerebellar: normal finger-to-nose with bilateral upper extremities Gait: gait testing deferred for pain control CV: distal pulses palpable throughout   Psychiatric:        Behavior: Behavior normal.        Thought Content: Thought content normal.        Judgment: Judgment normal.    ED Results / Procedures / Treatments   Labs (all labs ordered are listed, but only abnormal results are displayed) Labs Reviewed  DIFFERENTIAL - Abnormal; Notable  for the following components:      Result Value   Abs Immature Granulocytes 0.10 (*)    All other components within normal limits  COMPREHENSIVE METABOLIC PANEL - Abnormal; Notable for the following components:   Glucose, Bld 123 (*)    Creatinine, Ser 1.06 (*)    Calcium 8.7 (*)    All other components within normal limits  I-STAT CHEM 8, ED - Abnormal; Notable for the following components:   Creatinine, Ser 1.10 (*)    Glucose, Bld 120 (*)    Calcium, Ion 1.02 (*)    All other components within normal limits  CBG MONITORING, ED - Abnormal; Notable for the following components:    Glucose-Capillary 125 (*)    All other components within normal limits  RESP PANEL BY RT-PCR (FLU A&B, COVID) ARPGX2  ETHANOL  PROTIME-INR  APTT  CBC  RAPID URINE DRUG SCREEN, HOSP PERFORMED  URINALYSIS, ROUTINE W REFLEX MICROSCOPIC  I-STAT BETA HCG BLOOD, ED ([facility], [facility], [facility] ONLY)    EKG EKG Interpretation  Date/Time:  Friday May 11 2021 15:12:58 EST Ventricular Rate:  80 PR Interval:  169 QRS Duration: 78 QT Interval:  419 QTC Calculation: 484 R Axis:   8 Text Interpretation: Sinus rhythm Low voltage, precordial leads Abnormal R-wave progression, early transition Borderline T wave abnormalities Confirmed by Noemi Chapel [phone]) on 05/11/2021 3:27:22 PM   Radiology DG Lumbar Spine Complete  Result Date: 05/11/2021 CLINICAL DATA:  Acute back pain EXAM: LUMBAR SPINE - COMPLETE 4+ VIEW COMPARISON:  X-ray lumbar spine 03/31/2019 FINDINGS: Markedly limited evaluation due to overlapping osseous structures and overlying soft tissues. L4-L5 L5-S1 degenerative changes of the spine including osteophyte formation facet arthropathy. There is no evidence of lumbar spine fracture. Straightening of the normal lumbar lordosis likely due to positioning. Otherwise alignment is normal. L5-S1 intervertebral disc space narrowing. Intravenous contrast is noted being excreted by bilateral kidneys with intravenous contrast noted collecting within the urinary bladder lumen. IMPRESSION: No acute displaced fracture or traumatic listhesis of the lumbar spine. Electronically Signed   By: Iven Finn M.D.   On: 05/11/2021 16:05   MR BRAIN WO CONTRAST  Result Date: 05/11/2021 CLINICAL DATA:  Initial evaluation for neuro deficit, stroke suspected. EXAM: MRI HEAD WITHOUT CONTRAST TECHNIQUE: Multiplanar, multiecho pulse sequences of the brain and surrounding structures were obtained without intravenous contrast. COMPARISON:  Prior CTs from earlier the same day. FINDINGS: Brain: Examination mildly degraded by  motion artifact. Cerebral volume within normal limits for age. Mild scattered T2/FLAIR hyperintensity noted involving the periventricular deep white matter both cerebral hemispheres, most like related chronic microvascular ischemic disease, mild for age. No abnormal foci of restricted diffusion to suggest acute or subacute ischemia. Gray-white matter differentiation maintained. No encephalomalacia to suggest chronic cortical infarction. No evidence for acute or chronic intracranial hemorrhage. No mass lesion, midline shift or mass effect. No hydrocephalus or extra-axial fluid collection. Pituitary gland suprasellar region within normal limits. Midline structures intact. Vascular: Major intracranial vascular flow voids are maintained. Skull and upper cervical spine: Craniocervical junction within normal limits. Diffusely decreased T1 signal intensity seen within the bone marrow of the bony calvarium, nonspecific, but most commonly related to anemia, smoking or obesity. No focal marrow replacing lesion. No scalp soft tissue abnormality. Plagiocephaly noted. Sinuses/Orbits: Globes orbital soft tissues demonstrate no acute finding. Mild scattered mucosal thickening noted within the ethmoidal air cells and right maxillary sinus. No mastoid effusion. Inner ear structures grossly normal. Other: None. IMPRESSION: 1. No acute  intracranial abnormality. 2. Mild chronic microvascular ischemic disease for age. Electronically Signed   By: Jeannine Boga M.D.   On: 05/11/2021 20:15   MR LUMBAR SPINE WO CONTRAST  Result Date: 05/11/2021 CLINICAL DATA:  Initial evaluation for low back pain, lumbar radiculopathy, increased fracture risk. EXAM: MRI LUMBAR SPINE WITHOUT CONTRAST TECHNIQUE: Multiplanar, multisequence MR imaging of the lumbar spine was performed. No intravenous contrast was administered. COMPARISON:  Prior radiograph from earlier the same day. FINDINGS: Segmentation:  Examination severely degraded by motion  artifact. Standard segmentation. Lowest well-formed disc space labeled the L5-S1 level. Alignment: Straightening of the normal lumbar lordosis. Trace retrolisthesis of L2 on L3 and L3 on L4. Vertebrae: Vertebral body height maintained. No visible acute or subacute fracture on this motion degraded exam. Bone marrow signal intensity within normal limits. No visible discrete or worrisome osseous lesions. Discogenic reactive endplate change present about the L5-S1 interspace. No abnormal marrow edema. Conus medullaris and cauda equina: Conus extends to approximately the L1-2 level. Conus and cauda equina appear grossly normal allowing for motion. Paraspinal and other soft tissues: Paraspinous soft tissues demonstrate no definite acute abnormality, although evaluation limited by motion. Disc levels: L1-2: Mild disc bulge. No significant spinal stenosis. Foramina remain patent. L2-3: Trace retrolisthesis. Left foraminal to extraforaminal disc protrusion closely approximates the exiting left L2 nerve root as it courses of the left neural foramen (series 21, image 12). No significant spinal stenosis. Foramina remain grossly patent. L3-4: Probable small left foraminal disc protrusion extending into the inferior aspect of the left neural foramen (series 27, image 23). This closely approximates the exiting left L3 nerve root, which could potentially be affected. No spinal stenosis. Foramina remain grossly patent. L4-5: Mild disc bulge with disc desiccation. Mild left greater than right facet hypertrophy. Epidural lipomatosis. No significant spinal stenosis. Foramina remain patent. L5-S1: Advanced degenerative intervertebral disc space narrowing with diffuse disc bulge and disc desiccation. Associated reactive endplate change with marginal endplate osteophytic spurring. Superimposed central disc protrusion closely approximates the descending S1 nerve roots as they course through the lateral recesses (series 25, image 33). No  frank neural impingement or displacement. Mild epidural lipomatosis. Minimal facet spurring. No significant spinal stenosis. No more than mild bilateral L5 foraminal narrowing. IMPRESSION: 1. Motion degraded exam. 2. No acute fracture within the lumbar spine. 3. Advanced degenerative spondylosis with small central disc protrusion at L5-S1, closely approximating and potentially irritating either of the descending S1 nerve roots. 4. Left foraminal to extraforaminal disc protrusions at L2-3 and L3-4, closely approximating and potentially irritating either the exiting left L2 or L3 nerve roots respectively. Electronically Signed   By: Jeannine Boga M.D.   On: 05/11/2021 22:11   CT HEAD CODE STROKE WO CONTRAST  Result Date: 05/11/2021 CLINICAL DATA:  Code stroke. Acute neuro deficit. Right arm weakness EXAM: CT HEAD WITHOUT CONTRAST TECHNIQUE: Contiguous axial images were obtained from the base of the skull through the vertex without intravenous contrast. COMPARISON:  CT head 06/09/2020 FINDINGS: Brain: Ventricle size normal.  Negative for acute hemorrhage. Patchy white matter hypodensity bilaterally. There is low-density edema in the left occipital white matter which is not seen previously. This is a relatively small area and could represent ischemia or edema. Vascular: Negative for hyperdense vessel Skull: Negative Sinuses/Orbits: Mild mucosal edema right maxillary sinus. Otherwise sinuses clear. Negative orbit Other: None ASPECTS ([place] Stroke Program Early CT Score) - Ganglionic level infarction (caudate, lentiform nuclei, internal capsule, insula, M1-M3 cortex): 7 - Supraganglionic infarction (M4-M6 cortex):  3 Total score (0-10 with 10 being normal): 10 IMPRESSION: 1. Negative for intracranial hemorrhage. 2. Mild white matter changes bilaterally. Small area of white matter hypodensity in the left occipital white matter of indeterminate etiology. Possible ischemia or edema. Follow-up MRI without with  contrast recommended. 3. ASPECTS is 10 4. Code stroke imaging results were communicated on 05/11/2021 at 2:52 pm to provider Bhagat via text page Electronically Signed   By: Franchot Gallo M.D.   On: 05/11/2021 14:52   CT ANGIO HEAD NECK W WO CM (CODE STROKE)  Result Date: 05/11/2021 CLINICAL DATA:  Neuro deficit, acute, stroke suspected Stroke/TIA, assess extracranial arteries Stroke/TIA, assess intracranial arteries EXAM: CT ANGIOGRAPHY HEAD AND NECK TECHNIQUE: Multidetector CT imaging of the head and neck was performed using the standard protocol during bolus administration of intravenous contrast. Multiplanar CT image reconstructions and MIPs were obtained to evaluate the vascular anatomy. Carotid stenosis measurements (when applicable) are obtained utilizing NASCET criteria, using the distal internal carotid diameter as the denominator. CONTRAST:  31mL OMNIPAQUE IOHEXOL 350 MG/ML SOLN COMPARISON:  December 2021 CTA head FINDINGS: CTA NECK Aortic arch: Great vessel origins are patent. Right carotid system: Patent.  Retropharyngeal course.  No stenosis. Left carotid system: Patent.  Retropharyngeal course.  No stenosis. Vertebral arteries: Patent. Origins are not well evaluated due to artifact. No stenosis identified. Skeleton: Cervical spine degenerative changes. Other neck: Unremarkable. Upper chest: No apical lung mass. Review of the MIP images confirms the above findings CTA HEAD Anterior circulation: Intracranial internal carotid arteries are patent with mild calcified plaque. Anterior and middle cerebral arteries are patent. Posterior circulation: Intracranial vertebral arteries are patent. Basilar artery is patent. Major cerebellar artery origins are patent. Posterior cerebral arteries are patent. As noted previously, there is tortuosity of the proximal right posterior cerebral artery with small focal dilatation as the vessel turns. Venous sinuses: Patent as allowed by contrast bolus timing. Review of  the MIP images confirms the above findings IMPRESSION: No large vessel occlusion or hemodynamically significant stenosis. Continued stable appearance of tortuous proximal right PCA with focal dilatation that could reflect a small aneurysm. Electronically Signed   By: Macy Mis M.D.   On: 05/11/2021 15:09    Procedures .Critical Care Performed by: Abigail Butts, PA-C Authorized by: Abigail Butts, PA-C   Critical care provider statement:    Critical care time (minutes):  45   Critical care time was exclusive of:  Separately billable procedures and treating other patients and teaching time   Critical care was necessary to treat or prevent imminent or life-threatening deterioration of the following conditions:  CNS failure or compromise   Critical care was time spent personally by me on the following activities:  Development of treatment plan with patient or surrogate, discussions with consultants, evaluation of patient's response to treatment, examination of patient, ordering and review of laboratory studies, ordering and review of radiographic studies, ordering and performing treatments and interventions, pulse oximetry, re-evaluation of patient's condition and review of old charts   I assumed direction of critical care for this patient from another provider in my specialty: no     Care discussed with comment:  Neurology team   Medications Ordered in ED Medications  busPIRone (BUSPAR) tablet 10 mg (0 mg Oral Hold 05/11/21 2256)  citalopram (CELEXA) tablet 40 mg (40 mg Oral Given 05/11/21 1706)  gabapentin (NEURONTIN) capsule 300 mg (0 mg Oral Hold 05/11/21 2256)  oxyCODONE (Oxy IR/ROXICODONE) immediate release tablet 10 mg (10 mg Oral Not Given  05/11/21 1707)  apixaban (ELIQUIS) tablet 5 mg (0 mg Oral Hold 05/11/21 2256)  iohexol (OMNIPAQUE) 350 MG/ML injection 75 mL (75 mLs Intravenous Contrast Given 05/11/21 1456)  LORazepam (ATIVAN) injection 1 mg (1 mg Intravenous Given  05/11/21 1919)    ED Course  I have reviewed the triage vital signs and the nursing notes.  Pertinent labs & imaging results that were available during my care of the patient were reviewed by me and considered in my medical decision making (see chart for details).    MDM Rules/Calculators/A&P                           Patient presents as code stroke from triage after acute onset left leg weakness and low back pain.  Some concern for right upper extremity weakness in triage however on my exam patient has significant pain in her right shoulder but is able to give full strength in the right arm though it does elicit pain.  Evaluated by neurology team.  They suspect less likely CVA.  We will continue CVA work-up and began sciatica work-up as well.  Patient does have a documented history of same and takes gabapentin.  She did not take it this morning.  Pain control and gabapentin along with other morning medications ordered for her.  The patient was discussed with and evaluated by Dr. Sabra Heck who agrees with the treatment plan.  12:24 AM Stroke work-up negative.  This includes MRI of her brain.  This was reviewed by neurology.  She is cleared for discharge from a neurology standpoint.  MRI of the L-spine shows advanced degenerative spondylosis which is likely the cause of her sciatica symptoms.  Improved here in the emergency department.  She has been ambulatory with her walker with steady gait.  She is to continue taking her pain medications including her gabapentin and follow-up with neurology and orthopedics.  I have discussed these findings with the patient.  She states understanding and is in agreement with the plan.  Final Clinical Impression(s) / ED Diagnoses Final diagnoses:  Acute left-sided low back pain with left-sided sciatica    Rx / DC Orders ED Discharge Orders     None        Keana Dueitt, Gwenlyn Perking 05/12/21 Mickel Crow, MD 05/12/21 [phone]

## 2021-05-11 NOTE — Consult Note (Signed)
Neurology consult   CC: code stroke.  History is obtained from: EMS and patient.   HPI: [name] is a 55 yo female with a PMHx of morbid obesity, cerebral aneurysm 1.68mm vs. 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   Patient arrived to triage via EMS from neuro office for f/up with migraine headaches where she was noted to have severe left lower back pain radiating to bilateral LEs with diff ambulating with right hand weakness which she said started with back pain.   Regarding her headaches, these have been quite refractory there has been concern for medication overuse headache component, and today's plan is to start Brigantine.  She had a recent sleep study that was negative for sleep apnea but she does have significant sleep issues secondary to PTSD.  While the note just reports she does not take any current abortives.  Code stroke was called by ED MD. No acute finding on CTH. CTA H/N showed no LVO and stable equivocal aneurysm as before. Patient is on Eliquis for hx PE.   In the CT suite, patient was able to get up from wheelchair with noted pain to LLE. Her RUE weakness was not apparent. NIHSS 2 each for LEs.   LKW: 1330 hours TNK given?: No, last dose of Eliquis 11/10 at 7:30 PM IR Thrombectomy?: No, no LVO.  MRS: 1  NIHSS:  1a Level of Consciousness: 0 1b LOC Questions: 0 1c LOC Commands: 0 2 Best Gaze: 0 3 Visual: 0 4 Facial Palsy: 0 5a Motor Arm - left: 0 5b Motor Arm - Right: 0 6a Motor Leg - Left: 2 6b Motor Leg - Right: 2 7 Limb Ataxia: 0 8 Sensory: 0 9 Best Language: 0 10 Dysarthria: 0 11 Extinction and Inattention: 0 TOTAL:  4  ROS: A robust ROS was unable to be performed due to emergent nature of event. + back and left leg pain. No HA.   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 ([place]) AGE 13   HAD TO LEARN TO WALK AGAIN   Iron deficiency anemia due to chronic blood loss 09/09/2014   Low TSH level 09/08/2014   Lumbar herniated disc    Menorrhagia 09/08/2014   Migraine    CLUSTER AND MIGRAINES   Multiple thyroid nodules    Ovarian mass, right 09/09/2014   PE (pulmonary embolism) 2016   Peripheral neuropathy    FINGER TIPS AND TOES NUMB SOME   Preeclampsia  1983   WITH PREGNANCY   PTSD (post-traumatic stress disorder)    Scoliosis    Seizures (HCC) AGE 60   NONE SINCE, HAD CHICKEN POX THEN    Family History  Problem Relation Age of Onset   Diabetes Mother    Breast cancer Mother    CAD  Mother    Hypertension Mother    Alcohol abuse Father    Hypertension Father    Breast cancer Maternal Aunt    Breast cancer Maternal Aunt    Social History:  reports that she quit smoking about 6 years ago. Her smoking use included cigarettes. She has a 20.00 pack-year smoking history. She has never used smokeless tobacco. She reports that she does not drink alcohol and does not use drugs.   Prior to Admission medications   Medication Sig Start Date End Date Taking? Authorizing Provider  Cholecalciferol (VITAMIN D3) 10 MCG (400 UNIT) tablet Take 1 tablet (400 Units total) by mouth daily. 12/20/20   [name], MD  albuterol (PROVENTIL) (2.5 MG/3ML) 0.083% nebulizer solution Take 3 mLs (2.5 mg total) by nebulization every 6 (six) hours as needed for wheezing or shortness of breath. 12/19/20   [name], MD  albuterol (VENTOLIN  HFA) 108 (90 Base) MCG/ACT inhaler Inhale 2 puffs into the lungs every 6 (six) hours as needed for wheezing or shortness of breath. 12/19/20   [name], MD  atorvastatin (LIPITOR) 20 MG tablet TAKE ONE TABLET BY MOUTH ONCE DAILY AT BEDTIME 03/02/21   [name], MD  budesonide-formoterol [facility]) 160-4.5 MCG/ACT inhaler Inhale 2 puffs into the lungs 2 (two) times daily. 12/19/20   [name], MD  buPROPion (WELLBUTRIN XL) 300 MG 24 hr tablet Take 300 mg by mouth daily.  09/12/17   [provider]  busPIRone (BUSPAR) 5 MG tablet Take 10 mg by mouth 2 (two) times daily.    [provider]  citalopram (CELEXA) 40 MG tablet Take 40 mg by mouth daily.  12/01/17   [provider]  cyclobenzaprine (FLEXERIL) 10 MG tablet TAKE 1 TABLET (10 MG TOTAL) BY MOUTH 3 (THREE) TIMES DAILY AS NEEDED FOR MUSCLE SPASMS. 02/01/21   [name], MD  doxepin (SINEQUAN) 10 MG capsule Take 10 mg by mouth.    [provider]  ELIQUIS 5 MG TABS tablet TAKE ONE TABLET BY MOUTH TWICE DAILY MORNING AND EVENING 03/02/21   [name], MD  Erenumab-aooe (AIMOVIG) 140 MG/ML SOAJ Inject 140 mg into the skin every 28 (twenty-eight) days. 05/11/21   [name], Adam R, DO  gabapentin (NEURONTIN) 300 MG capsule TAKE 1 CAPSULE (300 MG TOTAL) BY MOUTH 3 (THREE) TIMES DAILY. 04/30/21   [name], MD  hydrochlorothiazide (MICROZIDE) 12.5 MG capsule TAKE ONE CAPSULE BY MOUTH ONCE DAILY(AM) 03/02/21   [name], MD  metoprolol tartrate (LOPRESSOR) 50 MG tablet TAKE ONE TABLET BY MOUTH TWICE DAILY 03/02/21   [name], MD  montelukast (SINGULAIR) 10 MG tablet TAKE 1 TABLET BY MOUTH EVERY EVENING AT BEDTIME 03/02/21   [name], MD  PAZEO 0.7 % SOLN  08/25/18   [provider]  prazosin (MINIPRESS) 1 MG capsule Take 6 mg by mouth at bedtime.    [provider]  sodium chloride (OCEAN) 0.65 % SOLN nasal spray Place 1 spray into both  nostrils as needed for congestion. 08/21/20   [name], MD  SYNTHROID 200 MCG tablet 200 mcg. 10/29/18   [provider]  topiramate (TOPAMAX) 200 MG tablet TAKE 1 TABLET (200 MG TOTAL) BY MOUTH AT BEDTIME. 07/11/20   [name], Adam R, DO  triamcinolone cream (KENALOG) 0.1 % Apply 1 application topically 2 (two) times daily. 12/19/20   [name], MD  TRULICITY 0.62 BJ/6.2GB SOPN Inject 1.5 mg  into the skin. 04/04/20   [provider]    Exam: Current vital signs: BP 123/82 (BP Location: Right Arm)   Pulse 83   Temp 98.6 F (37 C) (Oral)   Resp 16   SpO2 96%   Physical Exam  Constitutional: Appears well-developed and well-nourished. Morbidly obese.  Psych: Affect appropriate to situation. Eyes: No scleral injection. HENT: No OP obstruction. Head: Normocephalic.  Cardiovascular: Normal rate and regular rhythm.  Respiratory: Effort normal.  GI: Abdomen soft.  Obese. No distension. There is no tenderness.  Skin: WDI.  Neuro: Mental Status: Patient is awake, alert, oriented to person, place, month, year, and situation. Patient is able to give a clear and coherent history. No signs of neglect. Speech/Language:  Speech is clear, fluent without dysarthria or aphasia. Repetition, naming, and comprehension intact.  Cranial Nerves: II: Visual Fields are full. Pupils are equal, round, and reactive to light.  III,IV, VI: EOMI without ptosis or diploplia.  V: Facial sensation is symmetric to light touch in V1, V2, and V3. VII: Facial movement symmetrical. No facial droop.  VIII: hearing is intact to voice. X: Uvula elevates symmetrically. XI: Shoulder shrug is symmetric. XII: tongue is midline without atrophy or fasciculations.  Motor: BUEs: 5/5 with exception of 4+/5 RUE grip.  RLE: knee  5/5     unable to lift thigh off stretcher in sitting position.    plantar flexion   5/5     dorsiflexion  5/5 LLE: knee  5/5   Unable to lift thigh off stretcher from  sitting position. Exam limited by pain. Dorsiflexion and Plantar flexion 5/5.  Sensory: Sensation is symmetric to light touch in all fours extremities. Extinction absent to DSS.  Plantars: Toes are downgoing bilaterally.  Cerebellar: No ataxia noted with FNF. HKS unable to perform due to pain.   DTRs: 2+ bilateral brachialis. 0 for bilateral patella.   I have reviewed labs in epic and the pertinent results are: INR  1.1.    aPTT  29.    Creatinine  1.06.   Glucose 120.   MD reviewed the images obtained:  CT head  -Negative for intracranial hemorrhage. -Mild white matter changes bilaterally. Small area of white matter hypodensity in the left occipital white matter of indeterminate etiology. Possible ischemia or edema. Follow-up MRI without with contrast recommended. -ASPECTS is 10  CTA head and neck  -No large vessel occlusion or hemodynamically significant stenosis. -Continued stable appearance of tortuous proximal right PCA with focal dilatation that could reflect a small aneurysm.  Assessment: 55 yo female with multiple stroke risk factors including obesity with likely sedentary lifestyle, HLD, HTN, prior PE but on Eliquis, and DM II. Imaging in CT suite without any acute concerns. We will hold BP medications and Eliquis start until MRIb cleared. Her LLE weakness is most likely from known sciatica and low back pain. She is to f/up with orthopaedics. Unknown right hand weakness etiology, but resolved. No TNK offered due to mild symptoms, not suspicious for stroke.   Impression:  -LLE weakness likely due to left knee issue.  -Doubt stroke.  -Right hand weakness, resolved.  -LLE thigh weakness, limited by pain, and likely due to known sciatica.  -Multiple stroke risk factors.   Recommendations: -MRI brain w/o contrast -Given her weakness is associated with orthopaedic issue, she can likely leave from ED if MRI negative for stroke. Communicated to ED MD.  -Restart home BP  medications if MRIb negative for stroke.  -Restart Eliquis should  MRIb be negative.   If MRI + stroke- - Medicine admit.   - echo not needed from neurological standpoint, but will defer to medicine.  - Recommend labs: HbA1c, lipid panel, TSH. - Recommend Statin or increased dose if LDL > 70 - Aspirin 81mg  daily for secondary stroke prevention. - Continue Eliquis after MRI cleared.  - SBP goal - If MRI b + for stroke, then allow for permissive hypertension first 24 h < 220/110. If no stroke, BP goal < 130/90. Hold home medications for now. -Telemetry monitoring for arrhythmia. - bedside Swallow screen. - Stroke education. - PT/OT/SLP consult. - NIHSS as per protocol. - frequent neuro checks.  -low back pain with sciatica.  Agree with imaging ordered by ED PA-C.  -f/up with orthopaedics after discharge.  -stroke team to follow.   Patient seen by Clance Boll, MSN, APN-BC, nurse practitioner and by MD. Note/plan to be edited by MD as needed.  Pager: [phone]  Attending Neurologist's note:  This patient is not a candidate for acute intervention secondary to Eliquis use and no LVO, furthermore her acute CNS symptoms have resolved to baseline.  From a stroke risk perspective she is already medically maximized with Eliquis, and adjustment of her outpatient medications to meet LDL goal of less than 70 and A1c goal of less than 7% can be safely accomplished on an outpatient basis.  Leg pain she describes is consistent with sciatica and I defer to ED for management of the same.  I personally saw this patient, gathering history, performing a full neurologic examination, reviewing relevant labs, personally reviewing relevant imaging including Head CT and CTA, and formulated the assessment and plan, adding the note above for completeness and clarity to accurately reflect my thoughts  Lesleigh Noe MD-PhD [facility] [phone]  Available 7 AM to 7 PM, outside these hours  please contact Neurologist on call listed on AMION

## 2021-05-11 NOTE — ED Provider Notes (Signed)
Medical screening examination/treatment/procedure(s) were conducted as a shared visit with non-physician practitioner(s) and myself.  I personally evaluated the patient during the encounter.  Clinical Impression:   Final diagnoses:  Acute left-sided low back pain with left-sided sciatica    This patient is a very pleasant 55 year old female, she is morbidly obese, presents from the neurology office where she was being seen for her migraines stating that when she tried to stand up she got acute onset of pain in her left lower back radiating into her left thigh posteriorly radiating down her leg.  She states there is associated weakness with this as well.  On exam the patient has the ability to straight leg raise on the right several inches off the bed, on the left she is able to get the leg off the bed but barely and it does have a tremor and seems to collapse back to the bed fairly quickly.  When gravity is removed and the patient is asked to dorsiflex and plantarflex her ankle she has normal strength at that joint.  She is able to bend her knee.  She has symmetrical sensation.  She has no facial droop, speech is clear, right and left upper extremities have normal strength bilaterally, she does have some limitation of the right arm secondary to shoulder discomfort.  Pulses are normal,  The patient has a slight asymmetry of her legs with some slight pitting edema on the left but none on the right.  She is currently being treated for DVT with Eliquis.  Cardiac and pulmonary exams are unremarkable  Vital signs unremarkable with a blood pressure of 123/82  MRI of brain and lumbar spine indicated given the patient's symptoms however suspect this is go to be more of a sciatic type discomfort.   Melinda Chapel, MD 05/12/21 1450

## 2021-05-11 NOTE — ED Provider Notes (Signed)
Emergency Medicine Provider Triage Evaluation Note  Melinda [name] , a 55 y.o. female  was evaluated in triage.  Arrives via EMS from the neurologist office where she went for migraines.  Apparently the visit went fine however when patient went to stand from the chair she began having severe left lower back pain.  Per note she was noted to have left leg weakness as well as grip of right hand weaker.  EMS was called from neurology office.  When EMS arrived they only mentioned back pain radiating down bilateral lower extremities causing her difficulty with walking.  Patient denies radiating down bilaterally and states she is having pain in her left leg.  She is unsure when the right arm weakness started however thinks it started around the same time as the back pain.   Review of Systems  Positive: + back pain, weakness Negative:   Physical Exam  BP 123/82 (BP Location: Right Arm)   Pulse 83   Temp 98.6 F (37 C) (Oral)   Resp 16   SpO2 96%  Gen:   Awake, no distress   Resp:  Normal effort  MSK:   Moves extremities without difficulty  Other:  Weakness to LLE and R grip strength. Difficult to assess whether s/2 pain?   Medical Decision Making  Medically screening exam initiated at 2:25 PM.  Appropriate orders placed.  [name] was informed that the remainder of the evaluation will be completed by another provider, this initial triage assessment does not replace that evaluation, and the importance of remaining in the ED until their evaluation is complete.  2:26 PM Code stroke called due to LLE and RUE weakness that is new and within the window however given the symptoms are on opposite sides difficult to assess whether weakness is related to pain?    Eustaquio Maize, PA-C 05/11/21 1429    Charlesetta Shanks, MD 05/11/21 [phone]

## 2021-05-11 NOTE — Code Documentation (Signed)
Stroke Response Nurse Documentation Code Documentation  Melinda [name] is a 55 y.o. female arriving to [facility] ED via  EMS  on 05/11/21 with past medical hx of migraines, back pain, aneurysm (per pt). On clopidogrel 75 mg daily. Code stroke was activated by EDP.   Patient from her neurologist's office where she was LKW at 1330 and now complaining of right arm and left leg pain . Patient was at her doctor's office seeing her neurologist for migraines and a brain aneurysm per pt. Her office note says she was checking out and slumped over the check out desk with arm and leg pain shooting into her back. They talked her into going to the ED and called EMS.   Pt arrived to [facility]. Admitted by EDP who activated the code stroke for weakness in arm and leg. Labs drawn and patient cleared for CT by Dr. Colvin Caroli. Patient to CT with team where stroke team and neurologist met the patient. NIHSS 4, see documentation for details and code stroke times. Patient with bilateral leg weakness on exam. The following imaging was completed:  CT, CTA head and neck. Patient is not a candidate for IV Thrombolytic due to no stroke suspected and patient on blood thinner. Patient is not a candidate for IR due to no LVO.   Care/Plan: q2x12 then q4 vitals/neuro checks.   Bedside handoff with ED RN Hannie.    Alaijah Gibler, Rande Brunt  Stroke Response RN

## 2021-05-11 NOTE — ED Notes (Signed)
Pt off unit.. unable to check vitals

## 2021-05-11 NOTE — Patient Instructions (Signed)
Plan to start Aimovig 140mg  injection every 28 days Continue topiramate 200mg  at bedtime Limit use of pain relievers to no more than 2 days out of week to prevent risk of rebound or medication-overuse headache. Keep headache diary Check CTA of head Follow up 9 months.

## 2021-05-11 NOTE — ED Notes (Signed)
Pt to MRI

## 2021-05-11 NOTE — ED Notes (Signed)
Pt remains in MRI at this time - will obtain vitals when pt returns

## 2021-05-12 LAB — I-STAT BETA HCG BLOOD, ED (MC, WL, AP ONLY): I-stat hCG, quantitative: <5 m[IU]/mL (ref <5)

## 2021-05-12 NOTE — Discharge Instructions (Signed)
1. Medications: usual home medications 2. Treatment: rest, drink plenty of fluids,  3. Follow Up: Please followup with your primary doctor in 2-3 days for discussion of your diagnoses and further evaluation after today's visit; if you do not have a primary care doctor use the resource guide provided to find one; Please return to the ER for new or worsening symptoms  

## 2021-05-12 NOTE — ED Notes (Signed)
Patient verbalizes understanding of discharge instructions. Opportunity for questioning and answers were provided. Armband removed by staff, pt discharged from ED ambulatory with walker.

## 2021-05-12 NOTE — ED Notes (Signed)
[facility] CALLED

## 2021-05-14 ENCOUNTER — Telehealth: Payer: Self-pay

## 2021-05-14 DIAGNOSIS — F4325 Adjustment disorder with mixed disturbance of emotions and conduct: Secondary | ICD-10-CM | POA: Diagnosis not present

## 2021-05-14 DIAGNOSIS — E890 Postprocedural hypothyroidism: Secondary | ICD-10-CM | POA: Diagnosis not present

## 2021-05-14 NOTE — Telephone Encounter (Signed)
Transition Care Management Follow-up Telephone Call Date of discharge and from where: 05/12/2021[facility]  How have you been since you were released from the hospital? Patient stated she is doing ok. Will call this morning to schedule a follow up with PCP.  Any questions or concerns? No  Items Reviewed: Did the pt receive and understand the discharge instructions provided? Yes  Medications obtained and verified?  No new medications sent to pharmacy.  Other? No  Any new allergies since your discharge? No  Dietary orders reviewed? No Do you have support at home? Yes   Home Care and Equipment/Supplies: Were home health services ordered? not applicable If so, what is the name of the agency? N/A  Has the agency set up a time to come to the patient's home? not applicable Were any new equipment or medical supplies ordered?  No What is the name of the medical supply agency? N/A Were you able to get the supplies/equipment? not applicable Do you have any questions related to the use of the equipment or supplies? No  Functional Questionnaire: (I = Independent and D = Dependent) ADLs: I  Bathing/Dressing- I  Meal Prep- I  Eating- I  Maintaining continence- I  Transferring/Ambulation- I  Managing Meds- I  Follow up appointments reviewed:  PCP Hospital f/u appt confirmed? No   Specialist Hospital f/u appt confirmed? No   Are transportation arrangements needed? No  If their condition worsens, is the pt aware to call PCP or go to the Emergency Dept.? Yes Was the patient provided with contact information for the PCP's office or ED? Yes Was to pt encouraged to call back with questions or concerns? Yes

## 2021-05-16 ENCOUNTER — Other Ambulatory Visit: Payer: Medicaid Other | Admitting: Obstetrics and Gynecology

## 2021-05-16 ENCOUNTER — Other Ambulatory Visit: Payer: Self-pay

## 2021-05-16 NOTE — Patient Outreach (Signed)
Care Coordination  05/16/2021  Anina Schnake 1965-10-07 [id number]  Incoming call from patient.  Patient called to inform of recent ED visit-has follow up appointment scheduled.  Aida Raider RN, BSN [place]  [facility] Managed Medicaid High Risk [phone].

## 2021-05-18 ENCOUNTER — Ambulatory Visit: Payer: Medicaid Other | Admitting: Neurology

## 2021-05-21 ENCOUNTER — Telehealth: Payer: Self-pay

## 2021-05-21 NOTE — Telephone Encounter (Signed)
New message - website CoverMyMeds  Your information has been sent to [employer].  Melinda [name] (Key: [id number]) Rx #: [id number] Aimovig 140MG /ML auto-injectors   Form [employer] Electronic Prior Authorization Form (2017 NCPDP) Created 10 days ago Sent to Plan 7 minutes ago Plan Response 6 minutes ago Submit Clinical Questions less than a minute ago Determination Wait for Determination Please wait for [employer] 2017 NCPDP to return a determination.

## 2021-05-22 NOTE — Telephone Encounter (Signed)
F/u   Fax sent to the plan Your PA has been faxed to the plan as a paper copy. Please contact the plan directly if you haven't received a determination in a typical timeframe.  You will be notified of the determination electronically and via fax.  [name] (Key: [id number]) Aimovig 140MG /ML auto-injectors   Form Drug Appeal Form Appeal Created 1 day ago Sent to Plan 1 minute ago Determination Wait for Determination Please wait for the payer to return a determination.

## 2021-05-22 NOTE — Telephone Encounter (Signed)
F/u  Melinda [name] (Key: [id number]) Rx #: [id number] Aimovig 140MG /ML auto-injectors   Form [employer] Electronic Prior Authorization Form (2017 NCPDP) Created 11 days ago Sent to Plan 1 day ago Plan Response 1 day ago Submit Clinical Questions 1 day ago Determination Unfavorable 1 day ago Your prior authorization request has been denied. COMPLETE APPEAL Your request for prior authorization was denied, but an appeal is available for your patient. For assistance, contact our support team at [phone].  Message from plan: Request Reference Number: [id number]. AIMOVIG INJ 140MG /ML is denied for not meeting the prior authorization requirement(s). For further questions, call [place] at [phone] for more information.

## 2021-05-28 ENCOUNTER — Other Ambulatory Visit: Payer: Self-pay | Admitting: Internal Medicine

## 2021-05-28 DIAGNOSIS — S46011D Strain of muscle(s) and tendon(s) of the rotator cuff of right shoulder, subsequent encounter: Secondary | ICD-10-CM

## 2021-05-29 DIAGNOSIS — F4325 Adjustment disorder with mixed disturbance of emotions and conduct: Secondary | ICD-10-CM | POA: Diagnosis not present

## 2021-05-30 ENCOUNTER — Other Ambulatory Visit: Payer: Self-pay

## 2021-05-30 ENCOUNTER — Ambulatory Visit: Payer: Medicaid Other | Attending: Physician Assistant | Admitting: Physician Assistant

## 2021-05-30 ENCOUNTER — Encounter: Payer: Self-pay | Admitting: Physician Assistant

## 2021-05-30 VITALS — BP 122/78 | HR 78 | Resp 16 | Wt 349.2 lb

## 2021-05-30 DIAGNOSIS — R9389 Abnormal findings on diagnostic imaging of other specified body structures: Secondary | ICD-10-CM | POA: Diagnosis not present

## 2021-05-30 DIAGNOSIS — R937 Abnormal findings on diagnostic imaging of other parts of musculoskeletal system: Secondary | ICD-10-CM

## 2021-05-30 DIAGNOSIS — M5136 Other intervertebral disc degeneration, lumbar region: Secondary | ICD-10-CM | POA: Diagnosis not present

## 2021-05-30 DIAGNOSIS — R1032 Left lower quadrant pain: Secondary | ICD-10-CM

## 2021-05-30 DIAGNOSIS — Z09 Encounter for follow-up examination after completed treatment for conditions other than malignant neoplasm: Secondary | ICD-10-CM

## 2021-05-30 DIAGNOSIS — R140 Abdominal distension (gaseous): Secondary | ICD-10-CM

## 2021-05-30 MED ORDER — TRAMADOL HCL 50 MG PO TABS: 50.0000 mg | ORAL_TABLET | Freq: Three times a day (TID) | ORAL | 0 refills | Status: AC | PRN

## 2021-05-30 MED ORDER — GABAPENTIN 300 MG PO CAPS: ORAL_CAPSULE | ORAL | 1 refills | Status: DC

## 2021-05-30 NOTE — Progress Notes (Signed)
Patient ID: Melinda [name], female   DOB: 05/10/1966, 55 y.o.   MRN: [id number]    Melinda [name], is a 55 y.o. female  GGE:[id number]  YYT:[id number]  DOB - 1965-11-13  Chief Complaint  Patient presents with   Back Pain       Subjective:   Melinda [name] is a 55 y.o. female here today for a follow up visit After ED visit 05/11/2021 for back pain and HA.  She has a neurologist([place]).  She is continuing to have left lower back pain with sciatica and has not been referred to ortho yet.  See imaging results below. A1C=6.1 1 months ago  She is also having LLQ pain and swelling.  Previous u/s 1 month ago showed subcutaneous edema in the LLQ.  BMs moving normally.  No melena/hematochezia.  She says the area in her abdomen has about quadrupled in size. No N/V/D  From ED A/P: Patient presents as code stroke from triage after acute onset left leg weakness and low back pain.  Some concern for right upper extremity weakness in triage however on my exam patient has significant pain in her right shoulder but is able to give full strength in the right arm though it does elicit pain.   Evaluated by neurology team.  They suspect less likely CVA.  We will continue CVA work-up and began sciatica work-up as well.  Patient does have a documented history of same and takes gabapentin.  She did not take it this morning.  Pain control and gabapentin along with other morning medications ordered for her.   The patient was discussed with and evaluated by Dr. Sabra Heck who agrees with the treatment plan.   12:24 AM Stroke work-up negative.  This includes MRI of her brain.  This was reviewed by neurology.  She is cleared for discharge from a neurology standpoint.   MRI of the L-spine shows advanced degenerative spondylosis which is likely the cause of her sciatica symptoms.  Improved here in the emergency department.  She has been ambulatory with her walker with steady gait.   She is to continue taking her pain  medications including her gabapentin  and follow-up with neurology and orthopedics.  I have discussed these findings with the patient.  She states understanding and is in agreement  with the plan.  Imaging done:   Radiology DG Lumbar Spine Complete   Result Date: 05/11/2021 CLINICAL DATA:  Acute back pain EXAM: LUMBAR SPINE - COMPLETE 4+ VIEW COMPARISON:  X-ray lumbar spine 03/31/2019 FINDINGS: Markedly limited evaluation due to overlapping osseous structures and overlying soft tissues. L4-L5 L5-S1 degenerative changes of the spine including osteophyte formation facet arthropathy. There is no evidence of lumbar spine fracture. Straightening of the normal lumbar lordosis likely due to positioning. Otherwise alignment is normal. L5-S1 intervertebral disc space narrowing. Intravenous contrast is noted being excreted by bilateral kidneys with intravenous contrast noted collecting within the urinary bladder lumen. IMPRESSION: No acute displaced fracture or traumatic listhesis of the lumbar spine. Electronically Signed   By: Iven Finn M.D.   On: 05/11/2021 16:05    MR BRAIN WO CONTRAST   Result Date: 05/11/2021 CLINICAL DATA:  Initial evaluation for neuro deficit, stroke suspected. EXAM: MRI HEAD WITHOUT CONTRAST TECHNIQUE: Multiplanar, multiecho pulse sequences of the brain and surrounding structures were obtained without intravenous contrast. COMPARISON:  Prior CTs from earlier the same day. FINDINGS: Brain: Examination mildly degraded by motion artifact. Cerebral volume within normal limits for age. Mild scattered T2/FLAIR hyperintensity noted involving  the periventricular deep white matter both cerebral hemispheres, most like related chronic microvascular ischemic disease, mild for age. No abnormal foci of restricted diffusion to suggest acute or subacute ischemia. Gray-white matter differentiation maintained. No encephalomalacia to suggest chronic cortical infarction. No evidence for acute or  chronic intracranial hemorrhage. No mass lesion, midline shift or mass effect. No hydrocephalus or extra-axial fluid collection. Pituitary gland suprasellar region within normal limits. Midline structures intact. Vascular: Major intracranial vascular flow voids are maintained. Skull and upper cervical spine: Craniocervical junction within normal limits. Diffusely decreased T1 signal intensity seen within the bone marrow of the bony calvarium, nonspecific, but most commonly related to anemia, smoking or obesity. No focal marrow replacing lesion. No scalp soft tissue abnormality. Plagiocephaly noted. Sinuses/Orbits: Globes orbital soft tissues demonstrate no acute finding. Mild scattered mucosal thickening noted within the ethmoidal air cells and right maxillary sinus. No mastoid effusion. Inner ear structures grossly normal. Other: None. IMPRESSION: 1. No acute intracranial abnormality. 2. Mild chronic microvascular ischemic disease for age. Electronically Signed   By: Jeannine Boga M.D.   On: 05/11/2021 20:15    MR LUMBAR SPINE WO CONTRAST   Result Date: 05/11/2021 CLINICAL DATA:  Initial evaluation for low back pain, lumbar radiculopathy, increased fracture risk. EXAM: MRI LUMBAR SPINE WITHOUT CONTRAST TECHNIQUE: Multiplanar, multisequence MR imaging of the lumbar spine was performed. No intravenous contrast was administered. COMPARISON:  Prior radiograph from earlier the same day. FINDINGS: Segmentation:  Examination severely degraded by motion artifact. Standard segmentation. Lowest well-formed disc space labeled the L5-S1 level. Alignment: Straightening of the normal lumbar lordosis. Trace retrolisthesis of L2 on L3 and L3 on L4. Vertebrae: Vertebral body height maintained. No visible acute or subacute fracture on this motion degraded exam. Bone marrow signal intensity within normal limits. No visible discrete or worrisome osseous lesions. Discogenic reactive endplate change present about the L5-S1  interspace. No abnormal marrow edema. Conus medullaris and cauda equina: Conus extends to approximately the L1-2 level. Conus and cauda equina appear grossly normal allowing for motion. Paraspinal and other soft tissues: Paraspinous soft tissues demonstrate no definite acute abnormality, although evaluation limited by motion. Disc levels: L1-2: Mild disc bulge. No significant spinal stenosis. Foramina remain patent. L2-3: Trace retrolisthesis. Left foraminal to extraforaminal disc protrusion closely approximates the exiting left L2 nerve root as it courses of the left neural foramen (series 21, image 12). No significant spinal stenosis. Foramina remain grossly patent. L3-4: Probable small left foraminal disc protrusion extending into the inferior aspect of the left neural foramen (series 27, image 23). This closely approximates the exiting left L3 nerve root, which could potentially be affected. No spinal stenosis. Foramina remain grossly patent. L4-5: Mild disc bulge with disc desiccation. Mild left greater than right facet hypertrophy. Epidural lipomatosis. No significant spinal stenosis. Foramina remain patent. L5-S1: Advanced degenerative intervertebral disc space narrowing with diffuse disc bulge and disc desiccation. Associated reactive endplate change with marginal endplate osteophytic spurring. Superimposed central disc protrusion closely approximates the descending S1 nerve roots as they course through the lateral recesses (series 25, image 33). No frank neural impingement or displacement. Mild epidural lipomatosis. Minimal facet spurring. No significant spinal stenosis. No more than mild bilateral L5 foraminal narrowing. IMPRESSION: 1. Motion degraded exam. 2. No acute fracture within the lumbar spine. 3. Advanced degenerative spondylosis with small central disc protrusion at L5-S1, closely approximating and potentially irritating either of the descending S1 nerve roots. 4. Left foraminal to extraforaminal  disc protrusions at L2-3 and L3-4, closely  approximating and potentially irritating either the exiting left L2 or L3 nerve roots respectively. Electronically Signed   By: Jeannine Boga M.D.   On: 05/11/2021 22:11    CT HEAD CODE STROKE WO CONTRAST   Result Date: 05/11/2021 CLINICAL DATA:  Code stroke. Acute neuro deficit. Right arm weakness EXAM: CT HEAD WITHOUT CONTRAST TECHNIQUE: Contiguous axial images were obtained from the base of the skull through the vertex without intravenous contrast. COMPARISON:  CT head 06/09/2020 FINDINGS: Brain: Ventricle size normal.  Negative for acute hemorrhage. Patchy white matter hypodensity bilaterally. There is low-density edema in the left occipital white matter which is not seen previously. This is a relatively small area and could represent ischemia or edema. Vascular: Negative for hyperdense vessel Skull: Negative Sinuses/Orbits: Mild mucosal edema right maxillary sinus. Otherwise sinuses clear. Negative orbit Other: None ASPECTS (Kennedy Stroke Program Early CT Score) - Ganglionic level infarction (caudate, lentiform nuclei, internal capsule, insula, M1-M3 cortex): 7 - Supraganglionic infarction (M4-M6 cortex): 3 Total score (0-10 with 10 being normal): 10 IMPRESSION: 1. Negative for intracranial hemorrhage. 2. Mild white matter changes bilaterally. Small area of white matter hypodensity in the left occipital white matter of indeterminate etiology. Possible ischemia or edema. Follow-up MRI without with contrast recommended. 3. ASPECTS is 10 4. Code stroke imaging results were communicated on 05/11/2021 at 2:52 pm to provider Bhagat via text page Electronically Signed   By: Franchot Gallo M.D.   On: 05/11/2021 14:52    CT ANGIO HEAD NECK W WO CM (CODE STROKE)   Result Date: 05/11/2021 CLINICAL DATA:  Neuro deficit, acute, stroke suspected Stroke/TIA, assess extracranial arteries Stroke/TIA, assess intracranial arteries EXAM: CT ANGIOGRAPHY HEAD AND NECK  TECHNIQUE: Multidetector CT imaging of the head and neck was performed using the standard protocol during bolus administration of intravenous contrast. Multiplanar CT image reconstructions and MIPs were obtained to evaluate the vascular anatomy. Carotid stenosis measurements (when applicable) are obtained utilizing NASCET criteria, using the distal internal carotid diameter as the denominator. CONTRAST:  43mL OMNIPAQUE IOHEXOL 350 MG/ML SOLN COMPARISON:  December 2021 CTA head FINDINGS: CTA NECK Aortic arch: Great vessel origins are patent. Right carotid system: Patent.  Retropharyngeal course.  No stenosis. Left carotid system: Patent.  Retropharyngeal course.  No stenosis. Vertebral arteries: Patent. Origins are not well evaluated due to artifact. No stenosis identified. Skeleton: Cervical spine degenerative changes. Other neck: Unremarkable. Upper chest: No apical lung mass. Review of the MIP images confirms the above findings CTA HEAD Anterior circulation: Intracranial internal carotid arteries are patent with mild calcified plaque. Anterior and middle cerebral arteries are patent. Posterior circulation: Intracranial vertebral arteries are patent. Basilar artery is patent. Major cerebellar artery origins are patent. Posterior cerebral arteries are patent. As noted previously, there is tortuosity of the proximal right posterior cerebral artery with small focal dilatation as the vessel turns. Venous sinuses: Patent as allowed by contrast bolus timing. Review of the MIP images confirms the above findings IMPRESSION: No large vessel occlusion or hemodynamically significant stenosis. Continued stable appearance of tortuous proximal right PCA with focal dilatation that could reflect a small aneurysm. Electronically Signed   By: Macy Mis M.D.   On: 05/11/2021 15:09         No problems updated.  ALLERGIES: Allergies  Allergen Reactions   Ceftriaxone Anaphylaxis    ROCEPHIN   Metformin     ALL    Penicillins Shortness Of Breath    Has patient had a PCN reaction causing immediate rash, facial/tongue/throat  swelling, SOB or lightheadedness with hypotension: Yes Has patient had a PCN reaction causing severe rash involving mucus membranes or skin necrosis: No Has patient had a PCN reaction that required hospitalization No Has patient had a PCN reaction occurring within the last 10 years: No If all of the above answers are "NO", then [name] proceed with Cephalosporin use.    Shellfish Allergy Anaphylaxis   Flonase [Fluticasone Propionate]     Makes migraines worse   Gold-Containing Drug Products     HANDS ITCH   Nickel     HANDS SWELL   Citrus Rash    PAST MEDICAL HISTORY: Past Medical History:  Diagnosis Date   ADHD (attention deficit hyperactivity disorder)    Arthritis    Asthma    Cancer ([place])    Chicken pox AGE 28   COPD (chronic obstructive pulmonary disease) ([place])    pt reported   Diabetes mellitus without complication (HCC)    Diverticulosis, sigmoid    GERD (gastroesophageal reflux disease)    Glaucoma    BOTH EYES, NO EYE DROPS   Glaucoma    History of blood transfusion MARCH 2016    2UNITS GIVEN AND IRON GIVEN   Hyperglycemia 09/09/2014   Hypertension    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58   HAD TO LEARN TO WALK AGAIN   Iron deficiency anemia due to chronic blood loss 09/09/2014   Low TSH level 09/08/2014   Lumbar herniated disc    Menorrhagia 09/08/2014   Migraine    CLUSTER AND MIGRAINES   Multiple thyroid nodules    Ovarian mass, right 09/09/2014   PE (pulmonary embolism) 2016   Peripheral neuropathy    FINGER TIPS AND TOES NUMB SOME   Preeclampsia  1983   WITH PREGNANCY   PTSD (post-traumatic stress disorder)    Scoliosis    Seizures ([place]) AGE 28   NONE SINCE, HAD CHICKEN POX THEN    MEDICATIONS AT HOME: Prior to Admission medications   Medication Sig Start Date End Date Taking? Authorizing Provider   albuterol (PROVENTIL) (2.5 MG/3ML) 0.083% nebulizer solution Take 3 mLs (2.5 mg total) by nebulization every 6 (six) hours as needed for wheezing or shortness of breath. 12/19/20  Yes [name], MD  albuterol (VENTOLIN HFA) 108 (90 Base) MCG/ACT inhaler Inhale 2 puffs into the lungs every 6 (six) hours as needed for wheezing or shortness of breath. 12/19/20  Yes [name], MD  atorvastatin (LIPITOR) 20 MG tablet TAKE ONE TABLET BY MOUTH ONCE DAILY AT BEDTIME Patient taking differently: Take 20 mg by mouth at bedtime. 03/02/21  Yes [name], MD  budesonide-formoterol [facility]) 160-4.5 MCG/ACT inhaler Inhale 2 puffs into the lungs 2 (two) times daily. 12/19/20  Yes [name], MD  buPROPion (WELLBUTRIN XL) 300 MG 24 hr tablet Take 300 mg by mouth daily.  09/12/17  Yes [provider]  busPIRone (BUSPAR) 5 MG tablet Take 10 mg by mouth 2 (two) times daily.   Yes [provider]  Cholecalciferol (VITAMIN D3) 10 MCG (400 UNIT) tablet Take 1 tablet (400 Units total) by mouth daily. 12/20/20  Yes [name], MD  citalopram (CELEXA) 40 MG tablet Take 40 mg by mouth daily.  12/01/17  Yes [provider]  cyclobenzaprine (FLEXERIL) 10 MG tablet TAKE 1 TABLET (10 MG TOTAL) BY MOUTH 3 (THREE) TIMES DAILY AS NEEDED FOR MUSCLE SPASMS. 02/01/21  Yes [name], MD  doxepin (SINEQUAN) 10 MG capsule Take 10 mg by mouth.   Yes [provider]  ELIQUIS 5 MG TABS tablet TAKE ONE TABLET BY MOUTH TWICE DAILY MORNING AND EVENING Patient taking differently: Take 5 mg by mouth 2 (two) times daily. 03/02/21  Yes [name], MD  Erenumab-aooe (AIMOVIG) 140 MG/ML SOAJ Inject 140 mg into the skin every 28 (twenty-eight) days. 05/11/21  Yes Jaffe, Adam R, DO  hydrochlorothiazide (MICROZIDE) 12.5 MG capsule TAKE ONE CAPSULE BY MOUTH ONCE DAILY(AM) Patient taking differently: Take 12.5 mg by mouth daily. 03/02/21  Yes [name], MD   metoprolol tartrate (LOPRESSOR) 50 MG tablet TAKE ONE TABLET BY MOUTH TWICE DAILY Patient taking differently: Take 50 mg by mouth 2 (two) times daily. 03/02/21  Yes [name], MD  montelukast (SINGULAIR) 10 MG tablet TAKE 1 TABLET BY MOUTH EVERY EVENING AT BEDTIME Patient taking differently: Take 10 mg by mouth at bedtime. 03/02/21  Yes [name], MD  PAZEO 0.7 % SOLN  08/25/18  Yes [provider]  prazosin (MINIPRESS) 1 MG capsule Take 6 mg by mouth at bedtime.   Yes [provider]  sodium chloride (OCEAN) 0.65 % SOLN nasal spray Place 1 spray into both nostrils as needed for congestion. 08/21/20  Yes [name], MD  SYNTHROID 200 MCG tablet Take 200 mcg by mouth daily before breakfast. 10/29/18  Yes [provider]  topiramate (TOPAMAX) 200 MG tablet TAKE 1 TABLET (200 MG TOTAL) BY MOUTH AT BEDTIME. 07/11/20  Yes Jaffe, Adam R, DO  traMADol (ULTRAM) 50 MG tablet Take 1 tablet (50 mg total) by mouth every 8 (eight) hours as needed for up to 5 days. 05/30/21 06/04/21 Yes Argentina Donovan, PA-C  triamcinolone cream (KENALOG) 0.1 % Apply 1 application topically 2 (two) times daily. Patient taking differently: Apply 1 application topically daily as needed (For rash). 12/19/20  Yes [name], MD  TRULICITY 2.01 EO/7.1QR SOPN Inject 1.5 mg into the skin once a week. 04/04/20  Yes [provider]  gabapentin (NEURONTIN) 300 MG capsule TAKE ONE CAPSULE BY MOUTH THREE TIMES DAILY (AM+NOON+BEDTIME) 05/30/21   Melinda [name], Dionne Bucy, PA-C    ROS: Neg HEENT Neg resp Neg cardiac Neg GI Neg GU Neg MS Neg psych Neg neuro  Objective:   Vitals:   05/30/21 0947  BP: 122/78  Pulse: 78  Resp: 16  SpO2: 95%  Weight: (!) 349 lb 3.2 oz (158.4 kg)   Exam General appearance : Awake, alert, not in any distress. Speech Clear. Not toxic looking HEENT: Atraumatic and Normocephalic, pupils equally reactive to light and accomodation Neck: Supple,  no JVD. No cervical lymphadenopathy.  Chest: Good air entry bilaterally, CTAB.  No rales/rhonchi/wheezing CVS: S1 S2 regular, no murmurs.  Abdomen: Bowel sounds present, Non tender but full on L lower abdomen-even visually larger than the R lower abdomen. Extremities: B/L Lower Ext shows no edema, both legs are warm to touch Neurology: Awake alert, and oriented X 3, CN II-XII intact, Non focal Skin: No Rash  Data Review Lab Results  Component Value Date   HGBA1C 6.1 04/20/2021   HGBA1C 5.4 08/21/2020   HGBA1C 6.3 (H) 04/20/2020    Assessment & Plan   1. Bulging lumbar disc - traMADol (ULTRAM) 50 MG tablet; Take 1 tablet (50 mg total) by mouth every 8 (eight) hours as needed for up to 5 days.  Dispense: 15 tablet; Refill: 0 - Ambulatory referral to Orthopedic Surgery  2. Left lower quadrant abdominal pain - CT Abdomen Pelvis Wo Contrast; Future - traMADol (ULTRAM) 50 MG tablet; Take 1 tablet (50 mg total) by mouth every 8 (eight) hours as needed for up to 5 days.  Dispense: 15 tablet; Refill: 0  3. Bloating - CT Abdomen Pelvis Wo Contrast; Future  4. Abnormal ultrasound - CT Abdomen Pelvis Wo Contrast; Future  5. Abnormal MRI, spine - Ambulatory referral to Orthopedic Surgery  6. Encounter for examination following treatment at hospital     Patient have been counseled extensively about nutrition and exercise. Other issues discussed during this visit include: low cholesterol diet, weight control and daily exercise, foot care, annual eye examinations at Ophthalmology, importance of adherence with medications and regular follow-up. We also discussed long term complications of uncontrolled diabetes and hypertension.   Return for keep appt with Dr Wynetta Emery.  The patient was given clear instructions to go to ER or return to medical center if symptoms don't improve, worsen or new problems develop. The patient verbalized understanding. The patient was told to call to get lab results  if they haven't heard anything in the next week.      Freeman Caldron, PA-C [facility] [place], [place]   05/30/2021, 10:08 AM

## 2021-06-05 ENCOUNTER — Other Ambulatory Visit: Payer: Self-pay | Admitting: Internal Medicine

## 2021-06-05 DIAGNOSIS — E118 Type 2 diabetes mellitus with unspecified complications: Secondary | ICD-10-CM

## 2021-06-05 DIAGNOSIS — I10 Essential (primary) hypertension: Secondary | ICD-10-CM

## 2021-06-05 DIAGNOSIS — J4550 Severe persistent asthma, uncomplicated: Secondary | ICD-10-CM

## 2021-06-05 DIAGNOSIS — Z86718 Personal history of other venous thrombosis and embolism: Secondary | ICD-10-CM

## 2021-06-05 DIAGNOSIS — Z794 Long term (current) use of insulin: Secondary | ICD-10-CM

## 2021-06-05 NOTE — Telephone Encounter (Signed)
Requested Prescriptions  Pending Prescriptions Disp Refills  . montelukast (SINGULAIR) 10 MG tablet [Pharmacy Med Name: MONTELUKAST SODIUM 10 MG ORAL TABLET] 30 tablet 2    Sig: TAKE 1 TABLET BY MOUTH EVERY EVENING AT BEDTIME     Pulmonology:  Leukotriene Inhibitors Passed - 06/05/2021  4:30 PM      Passed - Valid encounter within last 12 months    Recent Outpatient Visits          6 days ago Bulging lumbar disc   [place], [place], [name]   1 month ago Type 2 diabetes mellitus with morbid obesity [facility])   [place] [name], MD   3 months ago Poor sleep   [place], Deborah B, MD   5 months ago Type 2 diabetes mellitus with morbid obesity [facility])   [place], MD   9 months ago Type 2 diabetes mellitus with peripheral neuropathy [facility])   [place], MD      Future Appointments            In 2 months Wynetta Emery, Dalbert Batman, MD [place]           . metoprolol tartrate (LOPRESSOR) 50 MG tablet [Pharmacy Med Name: METOPROLOL TARTRATE 50 MG ORAL TABLET] 60 tablet 2    Sig: TAKE ONE TABLET BY MOUTH TWICE DAILY (AM+BEDTIME)     Cardiovascular:  Beta Blockers Passed - 06/05/2021  4:30 PM      Passed - Last BP in normal range    BP Readings from Last 1 Encounters:  05/30/21 122/78         Passed - Last Heart Rate in normal range    Pulse Readings from Last 1 Encounters:  05/30/21 78         Passed - Valid encounter within last 6 months    Recent Outpatient Visits          6 days ago Bulging lumbar disc   [place], [place], [name]   1 month ago Type 2 diabetes mellitus with morbid obesity [facility])   [place], MD   3 months ago Poor sleep   [place], MD   5 months ago Type 2 diabetes mellitus with morbid obesity [facility])   [place], MD   9 months ago Type 2 diabetes mellitus with peripheral neuropathy [facility])   [place], MD      Future Appointments            In 2 months Wynetta Emery, Dalbert Batman, MD [place]           . ELIQUIS 5 MG TABS tablet [Pharmacy Med Name: ELIQUIS 5 MG ORAL TABLET] 60 tablet 2    Sig: TAKE ONE TABLET BY MOUTH TWICE DAILY MORNING AND EVENING     Hematology:  Anticoagulants Failed - 06/05/2021  4:30 PM      Failed - Cr in normal range and within 360 days    Creat  Date Value Ref Range Status  06/27/2016 0.72 0.50 - 1.10 mg/dL Final   Creatinine, Ser  Date Value  Ref Range Status  05/11/2021 1.10 (H) 0.44 - 1.00 mg/dL Final   Creatinine, Urine  Date Value Ref Range Status  08/25/2015 57 20 - 320 mg/dL Final         Passed - HGB in normal range and within 360 days    Hemoglobin  Date Value Ref Range Status  05/11/2021 15.0 12.0 - 15.0 g/dL Final  08/25/2020 15.7 11.1 - 15.9 g/dL Final         Passed - PLT in normal range and within 360 days    Platelets  Date Value Ref Range Status  05/11/2021 237 150 - 400 K/uL Final  04/29/2019 289 150 - 450 x10E3/uL Final         Passed - HCT in normal range and within 360 days    HCT  Date Value Ref Range Status  05/11/2021 44.0 36.0 - 46.0 % Final   Hematocrit  Date Value Ref Range Status  08/25/2020 46.6 34.0 - 46.6 % Final         Passed - Valid encounter within last 12 months    Recent Outpatient Visits          6 days ago Bulging lumbar disc   [place], [place], [name]   1 month ago Type 2 diabetes mellitus with morbid obesity [facility])   [place] [name], MD   3 months ago Poor  sleep   [place], [place]   5 months ago Type 2 diabetes mellitus with morbid obesity [facility])   [place] [name] B, MD   9 months ago Type 2 diabetes mellitus with peripheral neuropathy [facility])   [place], MD      Future Appointments            In 2 months Wynetta Emery, Dalbert Batman, MD [place]           . atorvastatin (LIPITOR) 20 MG tablet [Pharmacy Med Name: ATORVASTATIN CALCIUM 20 MG ORAL TABLET] 30 tablet 2    Sig: TAKE ONE TABLET BY MOUTH ONCE DAILY AT BEDTIME     Cardiovascular:  Antilipid - Statins Failed - 06/05/2021  4:30 PM      Failed - Total Cholesterol in normal range and within 360 days    Cholesterol, Total  Date Value Ref Range Status  04/20/2020 130 100 - 199 mg/dL Final         Failed - LDL in normal range and within 360 days    LDL Chol Calc (NIH)  Date Value Ref Range Status  04/20/2020 68 0 - 99 mg/dL Final         Failed - HDL in normal range and within 360 days    HDL  Date Value Ref Range Status  04/20/2020 44 >39 mg/dL Final         Failed - Triglycerides in normal range and within 360 days    Triglycerides  Date Value Ref Range Status  04/20/2020 95 0 - 149 mg/dL Final         Passed - Patient is not pregnant      Passed - Valid encounter within last 12 months    Recent Outpatient Visits          6 days ago Bulging lumbar disc   [place]  [facility] [place], [place], [name]   1 month ago Type 2 diabetes mellitus with morbid obesity [facility])   [place], MD   3 months ago Poor sleep   [place], MD   5 months ago Type 2 diabetes mellitus with morbid obesity [facility])   [place], MD   9 months ago Type 2  diabetes mellitus with peripheral neuropathy [facility])   [place], MD      Future Appointments            In 2 months Wynetta Emery Dalbert Batman, MD [place]           . hydrochlorothiazide (MICROZIDE) 12.5 MG capsule [Pharmacy Med Name: HYDROCHLOROTHIAZIDE 12.5 MG ORAL CAPSULE] 30 capsule 2    Sig: TAKE ONE CAPSULE BY MOUTH ONCE DAILY(AM)     Cardiovascular: Diuretics - Thiazide Failed - 06/05/2021  4:30 PM      Failed - Ca in normal range and within 360 days    Calcium  Date Value Ref Range Status  05/11/2021 8.7 (L) 8.9 - 10.3 mg/dL Final   Calcium, Ion  Date Value Ref Range Status  05/11/2021 1.02 (L) 1.15 - 1.40 mmol/L Final         Failed - Cr in normal range and within 360 days    Creat  Date Value Ref Range Status  06/27/2016 0.72 0.50 - 1.10 mg/dL Final   Creatinine, Ser  Date Value Ref Range Status  05/11/2021 1.10 (H) 0.44 - 1.00 mg/dL Final   Creatinine, Urine  Date Value Ref Range Status  08/25/2015 57 20 - 320 mg/dL Final         Passed - K in normal range and within 360 days    Potassium  Date Value Ref Range Status  05/11/2021 3.5 3.5 - 5.1 mmol/L Final         Passed - Na in normal range and within 360 days    Sodium  Date Value Ref Range Status  05/11/2021 143 135 - 145 mmol/L Final  12/19/2020 142 134 - 144 mmol/L Final         Passed - Last BP in normal range    BP Readings from Last 1 Encounters:  05/30/21 122/78         Passed - Valid encounter within last 6 months    Recent Outpatient Visits          6 days ago Bulging lumbar disc   [place], [place], [name]   1 month ago Type 2 diabetes mellitus with morbid obesity [facility])   [place] [name], MD   3 months ago Poor sleep   [place], Deborah B, MD   5 months ago Type 2 diabetes  mellitus with morbid obesity [facility])   [place] [name], MD   9 months ago Type 2 diabetes mellitus with peripheral neuropathy [facility])   [place], MD      Future Appointments            In 2 months Wynetta Emery Dalbert Batman, MD [place]

## 2021-06-07 ENCOUNTER — Other Ambulatory Visit: Payer: Self-pay | Admitting: Obstetrics and Gynecology

## 2021-06-07 ENCOUNTER — Other Ambulatory Visit: Payer: Self-pay

## 2021-06-07 NOTE — Patient Outreach (Signed)
Medicaid Managed Care   Nurse Care Manager Note  06/07/2021 Name:  Melinda [name] MRN:  [id number] DOB:  05-12-1966  Melinda [name] is an 55 y.o. year old female who is a primary patient of [name], MD.  The [facility] Managed Care Coordination team was consulted for assistance with:    Chronic healthcare management needs.  Ms. Hackmann was given information about Medicaid Managed Care Coordination team services today. [name] Patient agreed to services and verbal consent obtained.  Engaged with patient by telephone for follow up visit in response to provider referral for case management and/or care coordination services.   Assessments/Interventions:  Review of past medical history, allergies, medications, health status, including review of consultants reports, laboratory and other test data, was performed as part of comprehensive evaluation and provision of chronic care management services.  SDOH (Social Determinants of Health) assessments and interventions performed: SDOH Interventions    Flowsheet Row Most Recent Value  SDOH Interventions   Financial Strain Interventions Intervention Not Indicated       Care Plan  Allergies  Allergen Reactions   Ceftriaxone Anaphylaxis    ROCEPHIN   Metformin     ALL   Penicillins Shortness Of Breath    Has patient had a PCN reaction causing immediate rash, facial/tongue/throat swelling, SOB or lightheadedness with hypotension: Yes Has patient had a PCN reaction causing severe rash involving mucus membranes or skin necrosis: No Has patient had a PCN reaction that required hospitalization No Has patient had a PCN reaction occurring within the last 10 years: No If all of the above answers are "NO", then may proceed with Cephalosporin use.    Shellfish Allergy Anaphylaxis   Flonase [Fluticasone Propionate]     Makes migraines worse   Gold-Containing Drug Products     HANDS ITCH   Nickel     HANDS SWELL   Citrus Rash    Medications  Reviewed Today     Reviewed by Gayla Medicus, RN (Registered Nurse) on 06/07/21 at 77  Med List Status: <None>   Medication Order Taking? Sig Documenting Provider Last Dose Status Informant  albuterol (PROVENTIL) (2.5 MG/3ML) 0.083% nebulizer solution [id number]  Take 3 mLs (2.5 mg total) by nebulization every 6 (six) hours as needed for wheezing or shortness of breath. [name], MD  Active Self  albuterol (VENTOLIN HFA) 108 (90 Base) MCG/ACT inhaler [id number]  Inhale 2 puffs into the lungs every 6 (six) hours as needed for wheezing or shortness of breath. [name], MD  Active Self  atorvastatin (LIPITOR) 20 MG tablet [id number]  TAKE ONE TABLET BY MOUTH ONCE DAILY AT BEDTIME [name], MD  Active   budesonide-formoterol [facility]) 160-4.5 MCG/ACT inhaler [id number]  Inhale 2 puffs into the lungs 2 (two) times daily. [name], MD  Active Self  buPROPion (WELLBUTRIN XL) 300 MG 24 hr tablet [id number]  Take 300 mg by mouth daily.  [provider]  Active Self  busPIRone (BUSPAR) 5 MG tablet [id number]  Take 10 mg by mouth 2 (two) times daily. [provider]  Active Self           Med Note [name], [facility] I   Wed Feb 18, 2018 10:03 PM)    Cholecalciferol (VITAMIN D3) 10 MCG (400 UNIT) tablet [id number]  Take 1 tablet (400 Units total) by mouth daily. [name], MD  Active Self  citalopram (CELEXA) 40 MG tablet [id number]  Take 40 mg by mouth daily.  [provider]  Active Self  cyclobenzaprine (FLEXERIL) 10 MG tablet [id number]  TAKE 1 TABLET (10 MG TOTAL) BY MOUTH 3 (THREE) TIMES DAILY AS NEEDED FOR MUSCLE SPASMS. [name], MD  Active Self  doxepin (SINEQUAN) 10 MG capsule [id number]  Take 10 mg by mouth. [provider]  Active Self  ELIQUIS 5 MG TABS tablet [id number]  TAKE ONE TABLET BY MOUTH TWICE DAILY MORNING AND EVENING [name], MD  Active   Erenumab-aooe (AIMOVIG) 140 MG/ML Darden Palmer  [id number]  Inject 140 mg into the skin every 28 (twenty-eight) days. [name], DO  Active Self  gabapentin (NEURONTIN) 300 MG capsule [id number]  TAKE ONE CAPSULE BY MOUTH THREE TIMES DAILY (AM+NOON+BEDTIME) [place], [place], [name]  Active   hydrochlorothiazide (MICROZIDE) 12.5 MG capsule [id number]  TAKE ONE CAPSULE BY MOUTH ONCE DAILY(AM) [name], MD  Active   metoprolol tartrate (LOPRESSOR) 50 MG tablet [id number]  TAKE ONE TABLET BY MOUTH TWICE DAILY (AM+BEDTIME) [name], MD  Active   montelukast (SINGULAIR) 10 MG tablet [id number]  TAKE 1 TABLET BY MOUTH EVERY EVENING AT BEDTIME [name], MD  Active   PAZEO 0.7 % Bailey Mech [id number]   [provider]  Active Self  prazosin (MINIPRESS) 1 MG capsule [id number]  Take 6 mg by mouth at bedtime. [provider]  Active Self  sodium chloride (OCEAN) 0.65 % SOLN nasal spray [id number]  Place 1 spray into both nostrils as needed for congestion. [name], MD  Active Self  SYNTHROID 200 MCG tablet [id number]  Take 200 mcg by mouth daily before breakfast. [provider]  Active Self  topiramate (TOPAMAX) 200 MG tablet [id number]  TAKE 1 TABLET (200 MG TOTAL) BY MOUTH AT BEDTIME. [name], DO  Active Self  traMADol Veatrice Bourbon) 50 MG tablet [id number] Yes Take by mouth every 6 (six) hours as needed. Takes every 8 hours as needed. [provider]  Active Self  triamcinolone cream (KENALOG) 0.1 % [id number]  Apply 1 application topically 2 (two) times daily.  Patient taking differently: Apply 1 application topically daily as needed (For rash).   [name], MD  Active Self  TRULICITY 9.87 AJ/5.8NG Bonney Aid [id number]  Inject 1.5 mg into the skin once a week. [provider]  Active Self           Med Note ([place], [place]   Fri May 11, 2021  7:06 PM) Take on Wednesday            Patient Active Problem List   Diagnosis Date Noted   At high risk for falls 04/20/2020    Hyperlipidemia 02/16/2020   Adrenal mass, right ([place]) 04/29/2019   Chronic cough 03/12/2018   Left shoulder pain 10/14/2016   Hypertension 10/14/2016   S/P thyroidectomy 06/27/2016   Chronic bilateral low back pain without sciatica 04/22/2016   Liver lesion 12/24/2015   Controlled type 2 diabetes mellitus with complication, with long-term current use of insulin ([place]) 10/10/2015   Vision loss 08/25/2015   Decreased vision in both eyes 08/25/2015   Prolonged Q-T interval on ECG 08/19/2015   History of pulmonary embolism 08/10/2015   Mild persistent asthma 04/13/2015   Fibroid, uterine    Morbid obesity ([place]) 11/11/2014   Atypical chest pain 11/08/2014   Sinus tachycardia 11/08/2014   Migraine 10/14/2014   Menorrhagia 09/08/2014    Conditions to be addressed/monitored per PCP order:   chronic healthcare management needs, DM2,  asthma, HTN, migraines, chronic pain, anxiety, HLD  Care Plan : General Plan of Care (Adult)  Updates made by Gayla Medicus, RN since 06/07/2021 12:00 AM     Problem: Health Promotion or Disease Self-Management (General Plan of Care)   Priority: Medium  Onset Date: 10/20/2020     Long-Range Goal: Self-Management Plan Developed   Start Date: 07/14/2020  Expected End Date: 08/08/2021  Recent Progress: On track  Priority: Medium  Note:    Current Barriers:  Chronic Disease Management support and education needs related to DM, asthma, glaucoma, chronic pain,HTN, migraines Patient with anxiety and depression-has appointment with Psychiatrist  scheduled every 3 months, therapist every 2 weeks 06/07/21:  Patient complaining of hip pain today related to back pain-has appointment for CT 06/18/21 and Dr. [name] 06/21/21. Nurse Case Manager Clinical Goal(s):  Over the next 90 days, patient will attend all scheduled medical appointments:  Over the next 30 days, patient will work with CM team pharmacist to review medications. Interventions:  Inter-disciplinary care  team collaboration (see longitudinal plan of care) Evaluation of current treatment plan  and patient's adherence to plan as established by provider. Reviewed medications with patient. Collaborated with pharmacy regarding medications. Discussed plans with patient for ongoing care management follow up and provided patient with direct contact information for care management team Reviewed scheduled/upcoming provider appointments. Collaborated with SW for psychotherapy referral. SW referral for psychotherapy provider. Pharmacy referral for medication review. Self Care Activities: Over the next 90 days, patient will:  -Self administers medications as prescribed Attends all scheduled provider appointments Calls pharmacy for medication refills Calls provider office for new concerns or questions  Follow Up Plan: The Managed Medicaid care management team will reach out to the patient again over the next 30 days.  The patient has been provided with contact information for the Managed Medicaid care management team and has been advised to call with any health related questions or concerns.   Evidence-based guidance:  Review biopsychosocial determinants of health screens.  Determine level of modifiable health risk.  Assess level of patient activation, level of readiness, importance and confidence to make changes.  Evoke change talk using open-ended questions, pros and cons, as well as looking forward.  Identify areas where behavior change may lead to improved health.  Partner with patient to develop a robust self-management plan that includes lifestyle factors, such as weight loss, exercise and healthy nutrition, as well as goals specific to disease risks.  Support patient and family/caregiver active participation in decision-making and self-management plan.  Implement additional goals and interventions based on identified risk factors to reduce health risk.  Facilitate advance care planning.  Review  need for preventive screening based on age, sex, family history and health history.      Follow Up:  Patient agrees to Care Plan and Follow-up.  Plan: The Managed Medicaid care management team will reach out to the patient again over the next 30 days. and The  Patient has been provided with contact information for the Managed Medicaid care management team and has been advised to call with any health related questions or concerns.  Date/time of next scheduled RN care management/care coordination outreach:  07/05/21 at 1030.

## 2021-06-07 NOTE — Patient Instructions (Signed)
Hi Melinda [name], thank you for speaking to me, I hope you feel better.  Melinda [name] was given information about Medicaid Managed Care team care coordination services as a part of their [place] Medicaid benefit. Melinda [name] verbally consented to engagement with the [facility] Managed Care team.   If you are experiencing a medical emergency, please call 911 or report to your local emergency department or urgent care.   If you have a non-emergency medical problem during routine business hours, please contact your provider's office and ask to speak with a nurse.   For questions related to your [facility], please call: [phone] or visit the homepage here: [email or web address]  If you would like to schedule transportation through your [facility], please call the following number at least 2 days in advance of your appointment: [phone].   Call the [place] at [phone], at any time, 24 hours a day, 7 days a week. If you are in danger or need immediate medical attention call 911.  If you would like help to quit smoking, call 1-800-QUIT-NOW (216) 262-1372) OR Espaol: 1-855-Djelo-Ya (7-412-878-6767) o para ms informacin haga clic aqu or Text READY to 200-400 to register via text  Melinda [name] - following are the goals we discussed in your visit today:   Goals Addressed             This Visit's Progress    Protect My Health         Timeframe:  Long-Range Goal Priority:  Medium Start Date:    07/14/20                         Expected End Date:  ongoing          Follow Up Date: 07/08/21 - schedule appointment for vaccines needed due to my age or health - schedule recommended health tests (blood work, mammogram, colonoscopy, pap test) - schedule and keep appointment for annual check-up  Update 06/07/21:  Patient has upcoming appointment with Dr.  [name] 06/21/21 and CT scheduled for 06/18/21.    The patient verbalized understanding of instructions provided today and declined a print copy of patient instruction materials.   The Managed Medicaid care management team will reach out to the patient again over the next 30 days.  The  Patient  has been provided with contact information for the Managed Medicaid care management team and has been advised to call with any health related questions or concerns.   Melinda Raider RN, BSN [place]  [facility] - Managed Medicaid High Risk [phone].   Following is a copy of your plan of care:  Care Plan : General Plan of Care (Adult)  Updates made by Melinda Medicus, RN since 06/07/2021 12:00 AM     Problem: Health Promotion or Disease Self-Management (General Plan of Care)   Priority: Medium  Onset Date: 10/20/2020     Long-Range Goal: Self-Management Plan Developed   Start Date: 07/14/2020  Expected End Date: 08/08/2021  Recent Progress: On track  Priority: Medium  Note:    Current Barriers:  Chronic Disease Management support and education needs related to DM, asthma, glaucoma, chronic pain,HTN, migraines Patient with anxiety and depression-has appointment with Psychiatrist  scheduled every 3 months, therapist every 2 weeks 06/07/21:  Patient complaining of hip pain today related to back pain-has appointment for CT 06/18/21 and Dr. [name] 06/21/21. Nurse  Case Manager Clinical Goal(s):  Over the next 90 days, patient will attend all scheduled medical appointments:  Over the next 30 days, patient will work with CM team pharmacist to review medications. Interventions:  Inter-disciplinary care team collaboration (see longitudinal plan of care) Evaluation of current treatment plan  and patient's adherence to plan as established by provider. Reviewed medications with patient. Collaborated with pharmacy regarding medications. Discussed plans with  patient for ongoing care management follow up and provided patient with direct contact information for care management team Reviewed scheduled/upcoming provider appointments. Collaborated with SW for psychotherapy referral. SW referral for psychotherapy provider. Pharmacy referral for medication review. Self Care Activities: Over the next 90 days, patient will:  -Self administers medications as prescribed Attends all scheduled provider appointments Calls pharmacy for medication refills Calls provider office for new concerns or questions  Follow Up Plan: The Managed Medicaid care management team will reach out to the patient again over the next 30 days.  The patient has been provided with contact information for the Managed Medicaid care management team and has been advised to call with any health related questions or concerns.   Evidence-based guidance:  Review biopsychosocial determinants of health screens.  Determine level of modifiable health risk.  Assess level of patient activation, level of readiness, importance and confidence to make changes.  Evoke change talk using open-ended questions, pros and cons, as well as looking forward.  Identify areas where behavior change [name] lead to improved health.  Partner with patient to develop a robust self-management plan that includes lifestyle factors, such as weight loss, exercise and healthy nutrition, as well as goals specific to disease risks.  Support patient and family/caregiver active participation in decision-making and self-management plan.  Implement additional goals and interventions based on identified risk factors to reduce health risk.  Facilitate advance care planning.  Review need for preventive screening based on age, sex, family history and health history.

## 2021-06-08 ENCOUNTER — Ambulatory Visit (HOSPITAL_COMMUNITY): Payer: Medicaid Other

## 2021-06-13 ENCOUNTER — Telehealth: Payer: Self-pay | Admitting: Neurology

## 2021-06-13 DIAGNOSIS — F411 Generalized anxiety disorder: Secondary | ICD-10-CM | POA: Diagnosis not present

## 2021-06-13 DIAGNOSIS — F4325 Adjustment disorder with mixed disturbance of emotions and conduct: Secondary | ICD-10-CM | POA: Diagnosis not present

## 2021-06-13 DIAGNOSIS — F4310 Post-traumatic stress disorder, unspecified: Secondary | ICD-10-CM | POA: Diagnosis not present

## 2021-06-13 DIAGNOSIS — F331 Major depressive disorder, recurrent, moderate: Secondary | ICD-10-CM | POA: Diagnosis not present

## 2021-06-13 NOTE — Telephone Encounter (Signed)
[place] [facility] called for an appeal. She wanted to talk with geisla, she was sent to her voice mail. Stated there was a form the patient needed to sign. Did not give number, said she wanted geisla voice mail

## 2021-06-15 ENCOUNTER — Other Ambulatory Visit: Payer: Self-pay

## 2021-06-15 NOTE — Patient Instructions (Signed)
Visit Information  Melinda [name] was given information about Medicaid Managed Care team care coordination services as a part of their [place] Medicaid benefit. Melinda [name] verbally consented to engagement with the [facility] Managed Care team.   If you are experiencing a medical emergency, please call 911 or report to your local emergency department or urgent care.   If you have a non-emergency medical problem during routine business hours, please contact your provider's office and ask to speak with a nurse.   For questions related to your [facility], please call: [phone] or visit the homepage here: [email or web address]  If you would like to schedule transportation through your [facility], please call the following number at least 2 days in advance of your appointment: [phone].   Call the [place] at [phone], at any time, 24 hours a day, 7 days a week. If you are in danger or need immediate medical attention call 911.  If you would like help to quit smoking, call 1-800-QUIT-NOW 630-515-4974) OR Espaol: 1-855-Djelo-Ya (7-564-332-9518) o para ms informacin haga clic aqu or Text READY to 200-400 to register via text  Ms. Kautzman - following are the goals we discussed in your visit today:   Goals Addressed   None     The patient verbalized understanding of instructions provided today and declined a print copy of patient instruction materials.   RN Care Manager will call on 07/05/21 Pharmacist will call on 07/27/21 Next PCP appointment:  08/21/21  Melinda [name] PharmD, CPP High Risk Managed Medicaid [place] [phone]   Following is a copy of your plan of care:   Care Plan : General Pharmacy (Adult)  Updates made by Melinda [name], RPH-CPP since 06/15/2021 12:00 AM     Problem: Chronic Disease Management    Priority: High     Long-Range Goal: Managing Chronic Disease Therapies   Start Date: 06/15/2021  Expected End Date: 09/13/2021  This Visit's Progress: On track  Priority: High  Note:   Current Barriers:  Unable to achieve control of pain  Unable to obtain Aimovig prescription   Pharmacist Clinical Goal(s):  patient will achieve improvement in pain as evidenced by decreased pain score  through collaboration with PharmD and provider.    Interventions: Inter-disciplinary care team collaboration (see longitudinal plan of care) Comprehensive medication review performed; medication list updated in electronic medical record  DM trulicity 1.5mg  once weekly on Wednesdays 120-130 90 low (few times) honey 2 Breakfast: collard greens, meat Dinner:cereal or oatmeal or eggs + grits Snacks: fruit o water, diet tea  - Buproprion 300mg  daily, buspirone 10mg  twice daily, citalopram 40mg  daily, doxepin 10mg  Anxiety/PTSD:  sees her psychiatrist Q 3 mths.  Buspar was changed to 10 mg BID for anxiety. Therapist is at [place] and she is seen Q 2 wks.  every 3 mo 11/30 13  Pain cyclobenzaprine 10mg  three times daily, gabapentin 300mg  three times daily, tramadol 50mg  sciatic pain 7  aimobig still waiting on PA ask  Diabetes:  Controlled; current treatment: trulicity 1.5mg  once weekly on Wednesdays  Current glucose readings: fasting glucose: 120-130  Reports hypoglycemic symptoms when blood glucose reaches 90's. States she consumes a small amount of honey to bring up her blood glucose  Current meal patterns:  Breakfast: collard greens, meat Lunch: skips Dinner:cereal or oatmeal or eggs + grits Snacks: fruit or smoothie Drinks: water, diet tea  Current exercise: denies  Recommended patient continue to check  blood glucose once daily and also discuss with PCP regarding titrating up Trulicity as tolerated  Depression/Anxiety/:  Controlled; current treatment: Buproprion 300mg  daily,  buspirone 10mg  twice daily, citalopram 40mg  daily, doxepin 10mg ;   PHQ9: 13 (05/30/21)  Patient reports she just recently saw her psychiatrist (goes Q 3 months) and sees her therapist every two weeks.  Recommended patient continue to follow-up with psychiatrist and therapist  Pain (sciatic)      Uncontrolled; current treatment: cyclobenzaprine 10mg  three times daily, gabapentin 300mg  three times daily, tramadol 50mg       Rates pain 7/10      Patient has initial appointment with ortho on 12/22  Patient Goals/Self-Care Activities patient will:  - check glucose daily, document, and provide at future appointments  Follow Up Plan:  Telephone follow up appointment with care management team member scheduled for: 07/05/21 Care Manager; 07/27/21 Pharmacist Next PCP appointment scheduled for: 08/21/21  Pharmacist will reach out to neurology to check status of Aimovig

## 2021-06-15 NOTE — Patient Outreach (Signed)
Medicaid Managed Care Pharmacy Note  06/15/2021 Name:  Melinda [name] MRN:  [id number] DOB:  13-Sep-1965  Melinda [name] is an 54 y.o. year old female who is a primary patient of [name], MD.  The [facility] Managed Care Coordination team was consulted for assistance with disease management and care coordination needs.    Engaged with patient by telephone for follow up visit in response to referral for case management and/or care coordination services.  Ms. Piccini was given information about Medicaid Managed Care Coordination team services today. [name] Patient agreed to services and verbal consent obtained.  Objective:  Lab Results  Component Value Date   CREATININE 1.10 (H) 05/11/2021   CREATININE 1.06 (H) 05/11/2021   CREATININE 1.00 12/19/2020    Lab Results  Component Value Date   HGBA1C 6.1 04/20/2021       Component Value Date/Time   CHOL 130 04/20/2020 1133   TRIG 95 04/20/2020 1133   HDL 44 04/20/2020 1133   CHOLHDL 3.0 04/20/2020 1133   CHOLHDL 2.7 09/08/2014 0147   VLDL 18 09/08/2014 0147   LDLCALC 68 04/20/2020 1133    BP Readings from Last 3 Encounters:  05/30/21 122/78  05/12/21 127/77  05/11/21 107/67    Assessment/Interventions: Review of patient past medical history, allergies, medications, health status, including review of consultants reports, laboratory and other test data, was performed as part of comprehensive evaluation and provision of chronic care management services.   SDOH:  (Social Determinants of Health) assessments and interventions performed:    Care Plan  Allergies  Allergen Reactions   Ceftriaxone Anaphylaxis    ROCEPHIN   Metformin     ALL   Penicillins Shortness Of Breath    Has patient had a PCN reaction causing immediate rash, facial/tongue/throat swelling, SOB or lightheadedness with hypotension: Yes Has patient had a PCN reaction causing severe rash involving mucus membranes or skin necrosis: No Has patient had a  PCN reaction that required hospitalization No Has patient had a PCN reaction occurring within the last 10 years: No If all of the above answers are "NO", then may proceed with Cephalosporin use.    Shellfish Allergy Anaphylaxis   Flonase [Fluticasone Propionate]     Makes migraines worse   Gold-Containing Drug Products     HANDS ITCH   Nickel     HANDS SWELL   Citrus Rash    Medications Reviewed Today     Reviewed by Hughes Better, RPH-CPP (Pharmacist) on 06/15/21 at 1434  Med List Status: <None>   Medication Order Taking? Sig Documenting Provider Last Dose Status Informant  albuterol (PROVENTIL) (2.5 MG/3ML) 0.083% nebulizer solution [id number] No Take 3 mLs (2.5 mg total) by nebulization every 6 (six) hours as needed for wheezing or shortness of breath.  Patient not taking: Reported on 06/15/2021   [name], MD Not Taking Active Self  albuterol (VENTOLIN HFA) 108 (90 Base) MCG/ACT inhaler [id number] No Inhale 2 puffs into the lungs every 6 (six) hours as needed for wheezing or shortness of breath.  Patient not taking: Reported on 06/15/2021   [name], MD Not Taking Active Self  atorvastatin (LIPITOR) 20 MG tablet [id number] Yes TAKE ONE TABLET BY MOUTH ONCE DAILY AT BEDTIME [name], MD Taking Active   budesonide-formoterol [facility]) 160-4.5 MCG/ACT inhaler [id number] No Inhale 2 puffs into the lungs 2 (two) times daily.  Patient not taking: Reported on 06/15/2021   [name], MD Not Taking Active Self  buPROPion (  WELLBUTRIN XL) 300 MG 24 hr tablet [id number] Yes Take 300 mg by mouth daily.  [provider] Taking Active Self  busPIRone (BUSPAR) 5 MG tablet [id number] Yes Take 10 mg by mouth 2 (two) times daily. [provider] Taking Active Self           Med Note [name], [facility]   Wed Feb 18, 2018 10:03 PM)    Cholecalciferol (VITAMIN D3) 10 MCG (400 UNIT) tablet [id number] Yes Take 1 tablet (400 Units total) by  mouth daily. [name], MD Taking Active Self  citalopram (CELEXA) 40 MG tablet [id number] Yes Take 40 mg by mouth daily.  [provider] Taking Active Self  cyclobenzaprine (FLEXERIL) 10 MG tablet [id number] Yes TAKE 1 TABLET (10 MG TOTAL) BY MOUTH 3 (THREE) TIMES DAILY AS NEEDED FOR MUSCLE SPASMS. [name], MD Taking Active Self  doxepin (SINEQUAN) 10 MG capsule [id number] Yes Take 10 mg by mouth. [provider] Taking Active Self  ELIQUIS 5 MG TABS tablet [id number] Yes TAKE ONE TABLET BY MOUTH TWICE DAILY MORNING AND [name], MD Taking Active   Erenumab-aooe (AIMOVIG) 140 MG/ML SOAJ [id number] No Inject 140 mg into the skin every 28 (twenty-eight) days.  Patient not taking: Reported on 06/15/2021   [name], DO Not Taking Active Self  gabapentin (NEURONTIN) 300 MG capsule [id number] Yes TAKE ONE CAPSULE BY MOUTH THREE TIMES DAILY (AM+NOON+BEDTIME) Barbie, [place], PA-C Taking Active   hydrochlorothiazide (MICROZIDE) 12.5 MG capsule [id number] Yes TAKE ONE CAPSULE BY MOUTH ONCE DAILY(AM) [name], MD Taking Active   metoprolol tartrate (LOPRESSOR) 50 MG tablet [id number] Yes TAKE ONE TABLET BY MOUTH TWICE DAILY (AM+BEDTIME) [name], MD Taking Active   montelukast (SINGULAIR) 10 MG tablet [id number] Yes TAKE 1 TABLET BY MOUTH EVERY EVENING AT BEDTIME [name], MD Taking Active   PAZEO 0.7 % SOLN [id number]   [provider]  Active Self  prazosin (MINIPRESS) 1 MG capsule [id number] Yes Take 6 mg by mouth at bedtime. [provider] Taking Active Self  sodium chloride (OCEAN) 0.65 % SOLN nasal spray [id number] No Place 1 spray into both nostrils as needed for congestion.  Patient not taking: Reported on 06/15/2021   [name], MD Not Taking Active Self  SYNTHROID 200 MCG tablet [id number] Yes Take 200 mcg by mouth daily before breakfast. [provider] Taking Active Self   topiramate (TOPAMAX) 200 MG tablet [id number] Yes TAKE 1 TABLET (200 MG TOTAL) BY MOUTH AT BEDTIME. [name], DO Taking Active Self  traMADol (ULTRAM) 50 MG tablet [id number] Yes Take by mouth every 6 (six) hours as needed. Takes every 8 hours as needed. [provider] Taking Active Self  triamcinolone cream (KENALOG) 0.1 % [id number] No Apply 1 application topically 2 (two) times daily.  Patient not taking: Reported on 06/15/2021   [name], MD Not Taking Active Self  TRULICITY 3.21 YY/4.8GN SOPN [id number] Yes Inject 1.5 mg into the skin once a week. [provider] Taking Active Self           Med Note ([place], [place]   Fri May 11, 2021  7:06 PM) Take on Wednesday            Patient Active Problem List   Diagnosis Date Noted   At high risk for falls 04/20/2020   Hyperlipidemia 02/16/2020   Adrenal mass, right ([place]) 04/29/2019   Chronic cough 03/12/2018  Left shoulder pain 10/14/2016   Hypertension 10/14/2016   S/P thyroidectomy 06/27/2016   Chronic bilateral low back pain without sciatica 04/22/2016   Liver lesion 12/24/2015   Controlled type 2 diabetes mellitus with complication, with long-term current use of insulin ([place]) 10/10/2015   Vision loss 08/25/2015   Decreased vision in both eyes 08/25/2015   Prolonged Q-T interval on ECG 08/19/2015   History of pulmonary embolism 08/10/2015   Mild persistent asthma 04/13/2015   Fibroid, uterine    Morbid obesity ([place]) 11/11/2014   Atypical chest pain 11/08/2014   Sinus tachycardia 11/08/2014   Migraine 10/14/2014   Menorrhagia 09/08/2014    Conditions to be addressed/monitored per PCP order:  HTN, HLD, and DMII   Care Plan : General Pharmacy (Adult)  Updates made by Hughes Better, RPH-CPP since 06/15/2021 12:00 AM     Problem: Chronic Disease Management   Priority: High     Long-Range Goal: Managing Chronic Disease Therapies   Start Date: 06/15/2021  Expected End Date:  09/13/2021  This Visit's Progress: On track  Priority: High  Note:   Current Barriers:  Unable to achieve control of pain  Unable to obtain Aimovig prescription   Pharmacist Clinical Goal(s):  patient will achieve improvement in pain as evidenced by decreased pain score  through collaboration with PharmD and provider.    Interventions: Inter-disciplinary care team collaboration (see longitudinal plan of care) Comprehensive medication review performed; medication list updated in electronic medical record  DM trulicity 1.5mg  once weekly on Wednesdays 120-130 90 low (few times) honey 2 Breakfast: collard greens, meat Dinner:cereal or oatmeal or eggs + grits Snacks: fruit o water, diet tea  - Buproprion 300mg  daily, buspirone 10mg  twice daily, citalopram 40mg  daily, doxepin 10mg  Anxiety/PTSD:  sees her psychiatrist Q 3 mths.  Buspar was changed to 10 mg BID for anxiety. Therapist is at [place] and she is seen Q 2 wks.  every 3 mo 11/30 13  Pain cyclobenzaprine 10mg  three times daily, gabapentin 300mg  three times daily, tramadol 50mg  sciatic pain 7  aimobig still waiting on PA ask  Diabetes:  Controlled; current treatment: trulicity 1.5mg  once weekly on Wednesdays  Current glucose readings: fasting glucose: 120-130  Reports hypoglycemic symptoms when blood glucose reaches 90's. States she consumes a small amount of honey to bring up her blood glucose  Current meal patterns:  Breakfast: collard greens, meat Lunch: skips Dinner:cereal or oatmeal or eggs + grits Snacks: fruit or smoothie Drinks: water, diet tea  Current exercise: denies  Recommended patient continue to check blood glucose once daily and also discuss with PCP regarding titrating up Trulicity as tolerated  Depression/Anxiety/:  Controlled; current treatment: Buproprion 300mg  daily, buspirone 10mg  twice daily, citalopram 40mg  daily, doxepin 10mg ;   PHQ9: 13 (05/30/21)  Patient reports she just recently saw  her psychiatrist (goes Q 3 months) and sees her therapist every two weeks.  Recommended patient continue to follow-up with psychiatrist and therapist  Pain (sciatic)      Uncontrolled; current treatment: cyclobenzaprine 10mg  three times daily, gabapentin 300mg  three times daily, tramadol 50mg       Rates pain 7/10      Patient has initial appointment with ortho on 12/22  Patient Goals/Self-Care Activities patient will:  - check glucose daily, document, and provide at future appointments  Follow Up Plan:  Telephone follow up appointment with care management team member scheduled for: 07/05/21 Care Manager; 07/27/21 Pharmacist Next PCP appointment scheduled for: 08/21/21  Pharmacist will reach out to neurology  to check status of Aimovig    Hughes Better PharmD, CPP High Risk Managed Medicaid [place] [phone]

## 2021-06-18 ENCOUNTER — Other Ambulatory Visit: Payer: Self-pay

## 2021-06-18 ENCOUNTER — Ambulatory Visit (HOSPITAL_COMMUNITY)
Admission: RE | Admit: 2021-06-18 | Discharge: 2021-06-18 | Disposition: A | Discharge status: Home / Self Care | Payer: Medicaid Other | Source: Ambulatory Visit | Attending: Physician Assistant | Admitting: Physician Assistant

## 2021-06-18 DIAGNOSIS — R9389 Abnormal findings on diagnostic imaging of other specified body structures: Secondary | ICD-10-CM | POA: Diagnosis present

## 2021-06-18 DIAGNOSIS — R140 Abdominal distension (gaseous): Secondary | ICD-10-CM | POA: Diagnosis present

## 2021-06-18 DIAGNOSIS — R1032 Left lower quadrant pain: Secondary | ICD-10-CM | POA: Insufficient documentation

## 2021-06-19 ENCOUNTER — Other Ambulatory Visit: Payer: Self-pay | Admitting: Internal Medicine

## 2021-06-19 MED ORDER — CLINDAMYCIN HCL 300 MG PO CAPS: 300.0000 mg | ORAL_CAPSULE | Freq: Three times a day (TID) | ORAL | 0 refills | Status: DC

## 2021-06-20 ENCOUNTER — Telehealth: Payer: Self-pay | Admitting: Internal Medicine

## 2021-06-20 DIAGNOSIS — E278 Other specified disorders of adrenal gland: Secondary | ICD-10-CM

## 2021-06-20 NOTE — Telephone Encounter (Signed)
Phone call placed to patient last evening.  Patient informed that I was notified by the radiology of the results of her MRI.  It showed thickening of the skin in the left lower abdomen where she has a pannus.  Changes suggest infection.  No underlying abscess seen.  Advised patient that I will place her on some antibiotics to try to treat the skin infection.  She is allergic to penicillin and ceftriaxone.  I have sent prescription for clindamycin. Dosages also stated that the mass seen on the right adrenal gland has increased in size.  She recommends MRI to evaluate this further.  Patient is agreeable to having the MRI done.

## 2021-06-21 ENCOUNTER — Other Ambulatory Visit: Payer: Self-pay

## 2021-06-21 ENCOUNTER — Ambulatory Visit: Payer: Self-pay

## 2021-06-21 ENCOUNTER — Encounter: Payer: Self-pay | Admitting: Specialist

## 2021-06-21 ENCOUNTER — Ambulatory Visit (INDEPENDENT_AMBULATORY_CARE_PROVIDER_SITE_OTHER): Payer: Medicaid Other | Admitting: Specialist

## 2021-06-21 VITALS — BP 122/78 | HR 98 | Ht 70.0 in | Wt 349.0 lb

## 2021-06-21 DIAGNOSIS — R29898 Other symptoms and signs involving the musculoskeletal system: Secondary | ICD-10-CM | POA: Diagnosis not present

## 2021-06-21 DIAGNOSIS — M4726 Other spondylosis with radiculopathy, lumbar region: Secondary | ICD-10-CM

## 2021-06-21 DIAGNOSIS — M4802 Spinal stenosis, cervical region: Secondary | ICD-10-CM

## 2021-06-21 DIAGNOSIS — Z6841 Body Mass Index (BMI) 40.0 and over, adult: Secondary | ICD-10-CM | POA: Diagnosis not present

## 2021-06-21 DIAGNOSIS — M542 Cervicalgia: Secondary | ICD-10-CM | POA: Diagnosis not present

## 2021-06-21 DIAGNOSIS — M5136 Other intervertebral disc degeneration, lumbar region: Secondary | ICD-10-CM

## 2021-06-21 DIAGNOSIS — M5030 Other cervical disc degeneration, unspecified cervical region: Secondary | ICD-10-CM | POA: Diagnosis not present

## 2021-06-21 DIAGNOSIS — R270 Ataxia, unspecified: Secondary | ICD-10-CM

## 2021-06-21 DIAGNOSIS — M545 Low back pain, unspecified: Secondary | ICD-10-CM

## 2021-06-21 NOTE — Patient Instructions (Signed)
Avoid bending, stooping and avoid lifting weights greater than 10 lbs. Avoid prolong standing and walking. Avoid frequent bending and stooping  No lifting greater than 10 lbs. May use ice or moist heat for pain. Weight loss is of benefit. MRI of cervical spine and lumbar spine to assess for stenosis, these will need to be done under a general anesthesia as she is not  Able to stay still and the quality of the study is poor.

## 2021-06-21 NOTE — Telephone Encounter (Signed)
MRI has been scheduled for July 05, 2020 at [place] at [facility]. Pt will need to arrive at 430pm. Pt has been sent a MyChart message in regards to appointment

## 2021-06-21 NOTE — Progress Notes (Signed)
Office Visit Note   Patient: Melinda [name]           Date of Birth: April 25, 1966           MRN: [id number] Visit Date: 06/21/2021              Requested by: Argentina Donovan, PA-C [place],   [id number] PCP: [name], MD   Assessment & Plan: Visit Diagnoses:  1. Low back pain, unspecified back pain laterality, unspecified chronicity, unspecified whether sciatica present   2. Cervicalgia   3. Spinal stenosis of cervical region   4. Degenerative disc disease, cervical   5. Degenerative disc disease, lumbar   6. Other spondylosis with radiculopathy, lumbar region   7. Ataxia   8. Weakness of left leg   9. Body mass index 50.0-59.9, adult (HCC)     Plan: Avoid bending, stooping and avoid lifting weights greater than 10 lbs. Avoid prolong standing and walking. Avoid frequent bending and stooping  No lifting greater than 10 lbs. May use ice or moist heat for pain. Weight loss is of benefit. MRI of cervical spine and lumbar spine to assess for stenosis, these will need to be done under a general anesthesia as she is not  Able to stay still and the quality of the study is poor.   Follow-Up Instructions: Return in about 2 weeks (around 07/05/2021).   Orders:  Orders Placed This Encounter  Procedures   XR Lumb Spine Flex&Ext Only   No orders of the defined types were placed in this encounter.     Procedures: No procedures performed   Clinical Data: No additional findings.   Subjective: Chief Complaint  Patient presents with   Lower Back - Pain    55 year old female with history of increasing difficulty with standing and walk, using a walker now just in the house, in [place]ally kitchen she can  Hold onto items and bathroom is close to her bed 10 steps.Can't walk a mile, no!. She had medical transportation and the car pulls up to the door. She is short of breathe with standing and walking, has asthma and COPD. History of a PE in 2016 and she is  on eloquis and synthroid for rest of  Her life. This AM she fell and she had an accident. Has difficulty telling when she has to bowel and bladder incontinence that is worsening. She is on antibiotics that can give her diarrhea. There is an area on the left side that is concerning and she has had CT Scan. She was seen in October and [place] in November and a contrast CT scan was done. There is bilateral leg numbness right upper thigh and right upper foot. Left side the bottom of the foot is numb and the toes. She has diabetes that is controlled with diet, HgbA1c is in the 5s-6s last 2 years. She has a trulicity injection one time per week. The left leg is painful and it hurts to put weight on the left hip. She has difficulty reaching the shoe and sock on the left side. The left hip is painful to stand on. Lying in bed is uncomfortable. Has trouble lying down or finding a position to be comfortable due to left hip Pain and sleeping on the right side is difficult, she doesn't tolerate lying on that side, not sure why. No fever or chills. Body mass index is 50.08 kg/m. Reports she fell. She reports falling about once  per month.     Review of Systems  Constitutional: Negative.   HENT: Negative.    Eyes: Negative.   Respiratory: Negative.    Cardiovascular: Negative.   Gastrointestinal: Negative.   Endocrine: Negative.   Genitourinary: Negative.   Musculoskeletal: Negative.   Skin: Negative.   Allergic/Immunologic: Negative.   Neurological: Negative.   Hematological: Negative.   Psychiatric/Behavioral: Negative.      Objective: Vital Signs: BP 122/78 (BP Location: Left Arm, Patient Position: Sitting)    Pulse 98    Ht 5\' 10"  (1.778 m)    Wt (!) 349 lb (158.3 kg)    BMI 50.08 kg/m   Physical Exam  Back Exam   Tenderness  The patient is experiencing tenderness in the lumbar.  Range of Motion  Extension:  abnormal  Flexion:  abnormal  Lateral bend right:  abnormal  Lateral bend  left:  abnormal  Rotation right:  abnormal  Rotation left:  abnormal   Muscle Strength  Right Quadriceps:  5/5  Left Quadriceps:  5/5  Right Hamstrings:  5/5  Left Hamstrings:  5/5   Reflexes  Biceps:  2/4  Comments:  Hoffman's sign right thumb. Cervical spine with decrease extension by 40% she feels like she might pass out with extension of the neck.     Specialty Comments:  No specialty comments available.  Imaging: No results found.   PMFS History: Patient Active Problem List   Diagnosis Date Noted   At high risk for falls 04/20/2020   Hyperlipidemia 02/16/2020   Adrenal mass, right ([place]) 04/29/2019   Chronic cough 03/12/2018   Left shoulder pain 10/14/2016   Hypertension 10/14/2016   S/P thyroidectomy 06/27/2016   Chronic bilateral low back pain without sciatica 04/22/2016   Liver lesion 12/24/2015   Controlled type 2 diabetes mellitus with complication, with long-term current use of insulin ([place]) 10/10/2015   Vision loss 08/25/2015   Decreased vision in both eyes 08/25/2015   Prolonged Q-T interval on ECG 08/19/2015   History of pulmonary embolism 08/10/2015   Mild persistent asthma 04/13/2015   Fibroid, uterine    Morbid obesity ([place]) 11/11/2014   Atypical chest pain 11/08/2014   Sinus tachycardia 11/08/2014   Migraine 10/14/2014   Menorrhagia 09/08/2014   Past Medical History:  Diagnosis Date   ADHD (attention deficit hyperactivity disorder)    Arthritis    Asthma    Cancer ([place])    Chicken pox AGE 20   COPD (chronic obstructive pulmonary disease) ([place])    pt reported   Diabetes mellitus without complication ([place])    Diverticulosis, sigmoid    GERD (gastroesophageal reflux disease)    Glaucoma    BOTH EYES, NO EYE DROPS   Glaucoma    History of blood transfusion MARCH 2016    2UNITS GIVEN AND IRON GIVEN   Hyperglycemia 09/09/2014   Hypertension    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 ([place]) AGE 34   HAD TO LEARN TO WALK AGAIN   Iron deficiency anemia due to chronic blood loss 09/09/2014   Low TSH level 09/08/2014   Lumbar herniated disc    Menorrhagia 09/08/2014   Migraine    CLUSTER AND MIGRAINES   Multiple thyroid nodules    Ovarian mass, right 09/09/2014   PE (pulmonary embolism) 2016   Peripheral neuropathy    FINGER TIPS AND TOES NUMB SOME   Preeclampsia  1983   WITH PREGNANCY   PTSD (post-traumatic stress disorder)  Scoliosis    Seizures (HCC) AGE 73   NONE SINCE, HAD CHICKEN POX THEN    Family History  Problem Relation Age of Onset   Diabetes Mother    Breast cancer Mother    CAD Mother    Hypertension Mother    Alcohol abuse Father    Hypertension Father    Breast cancer Maternal Aunt    Breast cancer Maternal Aunt     Past Surgical History:  Procedure Laterality Date   ANGIOGRAM TO LEG  08-13-15   RIGHT   CHOLECYSTECTOMY     IR GENERIC HISTORICAL  04/04/2016   IR RADIOLOGIST EVAL & MGMT 04/04/2016 [name], MD [facility] INTERV RAD   THYROIDECTOMY N/A 11/17/2015   Procedure: TOTAL THYROIDECTOMY;  Surgeon: Armandina Gemma, MD;  Location: [facility] ORS;  Service: General;  Laterality: N/A;   UTERINE ARTERY EMBOLIZATION Bilateral 08/13/2015   Social History   Occupational History   Occupation: disabled  Tobacco Use   Smoking status: Former    Packs/day: 0.50    Years: 40.00    Pack years: 20.00    Types: Cigarettes    Quit date: 11/03/2014    Years since quitting: 6.6   Smokeless tobacco: Never  Vaping Use   Vaping Use: Never used  Substance and Sexual Activity   Alcohol use: No    Alcohol/week: 0.0 standard drinks   Drug use: No   Sexual activity: Never    Birth control/protection: None

## 2021-06-26 ENCOUNTER — Telehealth: Payer: Self-pay | Admitting: Internal Medicine

## 2021-06-27 DIAGNOSIS — F4325 Adjustment disorder with mixed disturbance of emotions and conduct: Secondary | ICD-10-CM | POA: Diagnosis not present

## 2021-06-27 NOTE — Telephone Encounter (Signed)
Sent pt a MyChart message

## 2021-06-28 ENCOUNTER — Other Ambulatory Visit: Payer: Self-pay | Admitting: Internal Medicine

## 2021-06-28 MED ORDER — DIAZEPAM 2 MG PO TABS: ORAL_TABLET | ORAL | 0 refills | Status: DC

## 2021-07-03 ENCOUNTER — Other Ambulatory Visit: Payer: Self-pay | Admitting: Neurology

## 2021-07-03 ENCOUNTER — Other Ambulatory Visit: Payer: Self-pay | Admitting: Internal Medicine

## 2021-07-03 ENCOUNTER — Other Ambulatory Visit: Payer: Self-pay | Admitting: Physician Assistant

## 2021-07-03 DIAGNOSIS — S46011D Strain of muscle(s) and tendon(s) of the rotator cuff of right shoulder, subsequent encounter: Secondary | ICD-10-CM

## 2021-07-05 ENCOUNTER — Encounter (HOSPITAL_COMMUNITY): Payer: Self-pay

## 2021-07-05 ENCOUNTER — Other Ambulatory Visit: Payer: Self-pay

## 2021-07-05 ENCOUNTER — Other Ambulatory Visit: Payer: Self-pay | Admitting: Obstetrics and Gynecology

## 2021-07-05 ENCOUNTER — Ambulatory Visit (HOSPITAL_COMMUNITY)
Admission: RE | Admit: 2021-07-05 | Discharge: 2021-07-05 | Disposition: A | Discharge status: Home / Self Care | Payer: Medicaid Other | Source: Ambulatory Visit | Attending: Internal Medicine | Admitting: Internal Medicine

## 2021-07-05 DIAGNOSIS — E278 Other specified disorders of adrenal gland: Secondary | ICD-10-CM

## 2021-07-05 NOTE — Patient Outreach (Signed)
Medicaid Managed Care   Nurse Care Manager Note  07/05/2021 Name:  Melinda [name] MRN:  [id number] DOB:  October 07, 1965  Melinda [name] is an 56 y.o. year old female who is a primary patient of [name], MD.  The [facility] Managed Care Coordination team was consulted for assistance with:    Chronic healthcare management needs.  [name] was given information about Medicaid Managed Care Coordination team services today. Melinda [name] Patient agreed to services and verbal consent obtained.  Engaged with patient by telephone for follow up visit in response to provider referral for case management and/or care coordination services.   Assessments/Interventions:  Review of past medical history, allergies, medications, health status, including review of consultants reports, laboratory and other test data, was performed as part of comprehensive evaluation and provision of chronic care management services.  SDOH (Social Determinants of Health) assessments and interventions performed: SDOH Interventions    Flowsheet Row Most Recent Value  SDOH Interventions   Stress Interventions --  [sees Psychiatrist]  Transportation Interventions [facility]       Care Plan  Allergies  Allergen Reactions   Ceftriaxone Anaphylaxis    ROCEPHIN   Metformin     ALL   Penicillins Shortness Of Breath    Has patient had a PCN reaction causing immediate rash, facial/tongue/throat swelling, SOB or lightheadedness with hypotension: Yes Has patient had a PCN reaction causing severe rash involving mucus membranes or skin necrosis: No Has patient had a PCN reaction that required hospitalization No Has patient had a PCN reaction occurring within the last 10 years: No If all of the above answers are "NO", then [name] proceed with Cephalosporin use.    Shellfish Allergy Anaphylaxis   Flonase [Fluticasone Propionate]     Makes migraines worse   Gold-Containing Drug Products     HANDS ITCH   Nickel      HANDS SWELL   Citrus Rash    Medications Reviewed Today     Reviewed by Gayla Medicus, RN (Registered Nurse) on 07/05/21 at 1101  Med List Status: <None>   Medication Order Taking? Sig Documenting Provider Last Dose Status Informant  albuterol (PROVENTIL) (2.5 MG/3ML) 0.083% nebulizer solution [id number] No Take 3 mLs (2.5 mg total) by nebulization every 6 (six) hours as needed for wheezing or shortness of breath.  Patient not taking: Reported on 06/15/2021   [name], MD Not Taking Active Self  albuterol (VENTOLIN HFA) 108 (90 Base) MCG/ACT inhaler [id number] No Inhale 2 puffs into the lungs every 6 (six) hours as needed for wheezing or shortness of breath.  Patient not taking: Reported on 06/15/2021   [name], MD Not Taking Active Self  atorvastatin (LIPITOR) 20 MG tablet [id number] No TAKE ONE TABLET BY MOUTH ONCE DAILY AT BEDTIME [name], MD Taking Active   budesonide-formoterol [facility]) 160-4.5 MCG/ACT inhaler [id number] No Inhale 2 puffs into the lungs 2 (two) times daily.  Patient not taking: Reported on 06/15/2021   [name], MD Not Taking Active Self  buPROPion (WELLBUTRIN XL) 300 MG 24 hr tablet [id number] No Take 300 mg by mouth daily.  [provider] Taking Active Self  busPIRone (BUSPAR) 5 MG tablet [id number] No Take 10 mg by mouth 2 (two) times daily. [provider] Taking Active Self           Med Note Melinda [name], [facility]   Wed Feb 18, 2018 10:03 PM)    Cholecalciferol (VITAMIN D3) 10 MCG (  400 UNIT) CAPS [id number]  TAKE 1 CAPSULE (400 UNITS TOTAL) BY MOUTH DAILY (AM) [name], MD  Active   citalopram (CELEXA) 40 MG tablet [id number] No Take 40 mg by mouth daily.  [provider] Taking Active Self  clindamycin (CLEOCIN) 300 MG capsule [id number]  Take 1 capsule (300 mg total) by mouth 3 (three) times daily. [name], MD  Active   cyclobenzaprine (FLEXERIL) 10 MG tablet [id number]  TAKE  1 TABLET (10 MG TOTAL) BY MOUTH 3 (THREE) TIMES DAILY AS NEEDED FOR MUSCLE SPASMS. [name], MD  Active   diazepam (VALIUM) 2 MG tablet [id number]  Take 1 tablet half hour to an hour prior to your MRI. [name], MD  Active   doxepin (SINEQUAN) 10 MG capsule [id number] No Take 10 mg by mouth. [provider] Taking Active Self  ELIQUIS 5 MG TABS tablet [id number] No TAKE ONE TABLET BY MOUTH TWICE DAILY MORNING AND EVENING [name], MD Taking Active   Erenumab-aooe (AIMOVIG) 140 MG/ML SOAJ [id number] No Inject 140 mg into the skin every 28 (twenty-eight) days.  Patient not taking: Reported on 06/15/2021   [name], DO Not Taking Active Self  gabapentin (NEURONTIN) 300 MG capsule [id number] No TAKE ONE CAPSULE BY MOUTH THREE TIMES DAILY (AM+NOON+BEDTIME) Danai, Gotto, PA-C Taking Active   hydrochlorothiazide (MICROZIDE) 12.5 MG capsule [id number] No TAKE ONE CAPSULE BY MOUTH ONCE DAILY(AM) [name], MD Taking Active   metoprolol tartrate (LOPRESSOR) 50 MG tablet [id number] No TAKE ONE TABLET BY MOUTH TWICE DAILY (AM+BEDTIME) [name], MD Taking Active   montelukast (SINGULAIR) 10 MG tablet [id number] No TAKE 1 TABLET BY MOUTH EVERY EVENING AT BEDTIME [name], MD Taking Active   PAZEO 0.7 % SOLN [id number] No  [provider] Taking Active Self  prazosin (MINIPRESS) 1 MG capsule [id number] No Take 6 mg by mouth at bedtime. [provider] Taking Active Self  sodium chloride (OCEAN) 0.65 % SOLN nasal spray [id number] No Place 1 spray into both nostrils as needed for congestion.  Patient not taking: Reported on 06/15/2021   [name], MD Not Taking Active Self  SYNTHROID 200 MCG tablet [id number] No Take 200 mcg by mouth daily before breakfast. [provider] Taking Active Self  topiramate (TOPAMAX) 200 MG tablet [id number]  TAKE 1 TABLET (200 MG TOTAL) BY MOUTH AT BEDTIME. [name], DO  Active    traMADol (ULTRAM) 50 MG tablet [id number] No Take by mouth every 6 (six) hours as needed. Takes every 8 hours as needed. [provider] Taking Active Self  triamcinolone cream (KENALOG) 0.1 % [id number] No Apply 1 application topically 2 (two) times daily.  Patient not taking: Reported on 06/15/2021   [name], MD Not Taking Active Self  TRULICITY 6.22 QJ/3.3LK SOPN [id number] No Inject 1.5 mg into the skin once a week. [provider] Taking Active Self           Med Note ([place], [place]   Fri May 11, 2021  7:06 PM) Take on Wednesday            Patient Active Problem List   Diagnosis Date Noted   At high risk for falls 04/20/2020   Hyperlipidemia 02/16/2020   Adrenal mass, right ([place]) 04/29/2019   Chronic cough 03/12/2018   Left shoulder pain 10/14/2016   Hypertension 10/14/2016   S/P thyroidectomy 06/27/2016   Chronic bilateral low back pain  without sciatica 04/22/2016   Liver lesion 12/24/2015   Controlled type 2 diabetes mellitus with complication, with long-term current use of insulin ([place]) 10/10/2015   Vision loss 08/25/2015   Decreased vision in both eyes 08/25/2015   Prolonged Q-T interval on ECG 08/19/2015   History of pulmonary embolism 08/10/2015   Mild persistent asthma 04/13/2015   Fibroid, uterine    Morbid obesity ([place]) 11/11/2014   Atypical chest pain 11/08/2014   Sinus tachycardia 11/08/2014   Migraine 10/14/2014   Menorrhagia 09/08/2014    Conditions to be addressed/monitored per PCP order:   chronic healthcare management needs, DM, asthma, HTN, migraines, anxiety, chronic pain,HLD  Care Plan : General Plan of Care (Adult)  Updates made by Gayla Medicus, RN since 07/05/2021 12:00 AM     Problem: Health Promotion or Disease Self-Management (General Plan of Care)   Priority: Medium  Onset Date: 10/20/2020     Long-Range Goal: Self-Management Plan Developed   Start Date: 07/14/2020  Expected End Date: 10/03/2021  Recent  Progress: On track  Priority: Medium  Note:    Current Barriers:  Chronic Disease Management support and education needs related to DM, asthma, glaucoma, chronic pain,HTN, migraines Patient with anxiety and depression-has appointment with Psychiatrist  scheduled every 3 months, therapist every 2 weeks 07/05/21:  Hip and back pain ongoing-has MRI scheduled for today and 07/17/21.  Using meds and TENS unit as needed. Nurse Case Manager Clinical Goal(s):  Over the next 90 days, patient will attend all scheduled medical appointments:  Over the next 30 days, patient will work with CM team pharmacist to review medications. Interventions:  Inter-disciplinary care team collaboration (see longitudinal plan of care) Evaluation of current treatment plan  and patient's adherence to plan as established by provider. Reviewed medications with patient. Collaborated with pharmacy regarding medications. Discussed plans with patient for ongoing care management follow up and provided patient with direct contact information for care management team Reviewed scheduled/upcoming provider appointments. Collaborated with SW for psychotherapy referral. SW referral for psychotherapy provider. Pharmacy referral for medication review. Self Care Activities: Over the next 90 days, patient will:  -Self administers medications as prescribed Attends all scheduled provider appointments Calls pharmacy for medication refills Calls provider office for new concerns or questions  Follow Up Plan: The Managed Medicaid care management team will reach out to the patient again over the next 30 days.  The patient has been provided with contact information for the Managed Medicaid care management team and has been advised to call with any health related questions or concerns.   Evidence-based guidance:  Review biopsychosocial determinants of health screens.  Determine level of modifiable health risk.  Assess level of patient  activation, level of readiness, importance and confidence to make changes.  Evoke change talk using open-ended questions, pros and cons, as well as looking forward.  Identify areas where behavior change [name] lead to improved health.  Partner with patient to develop a robust self-management plan that includes lifestyle factors, such as weight loss, exercise and healthy nutrition, as well as goals specific to disease risks.  Support patient and family/caregiver active participation in decision-making and self-management plan.  Implement additional goals and interventions based on identified risk factors to reduce health risk.  Facilitate advance care planning.  Review need for preventive screening based on age, sex, family history and health history.     Follow Up:  Patient agrees to Care Plan and Follow-up.  Plan: The Managed Medicaid care management team will reach  out to the patient again over the next 30 days. and The  Patient has been provided with contact information for the Managed Medicaid care management team and has been advised to call with any health related questions or concerns.  Date/time of next scheduled RN care management/care coordination outreach:  07/31/21 at 0900.

## 2021-07-05 NOTE — Patient Instructions (Signed)
Hi Ms. Cokley, thank you for speaking with me today-I hope all goes well with your appointment today.  Ms. Desouza was given information about Medicaid Managed Care team care coordination services as a part of their [place] Medicaid benefit. [name] verbally consented to engagement with the [facility] Managed Care team.   If you are experiencing a medical emergency, please call 911 or report to your local emergency department or urgent care.   If you have a non-emergency medical problem during routine business hours, please contact your provider's office and ask to speak with a nurse.   For questions related to your [facility], please call: [phone] or visit the homepage here: [email or web address]  If you would like to schedule transportation through your [facility], please call the following number at least 2 days in advance of your appointment: [phone].   Call the [place] at [phone], at any time, 24 hours a day, 7 days a week. If you are in danger or need immediate medical attention call 911.  If you would like help to quit smoking, call 1-800-QUIT-NOW (367)400-6426) OR Espaol: 1-855-Djelo-Ya (5-400-867-6195) o para ms informacin haga clic aqu or Text READY to 200-400 to register via text  Ms. Westerhoff - following are the goals we discussed in your visit today:   Goals Addressed             This Visit's Progress    Protect My Health        Timeframe:  Long-Range Goal Priority:  Medium Start Date:    07/14/20                         Expected End Date:  ongoing          Follow Up Date: 08/05/21 - schedule appointment for vaccines needed due to my age or health - schedule recommended health tests (blood work, mammogram, colonoscopy, pap test) - schedule and keep appointment for annual check-up  07/05/21:  patient scheduled to  have abdominal MRI today and MRI of back 07/17/21 followed by appt with Dr. [name] 07/20/21.    The patient verbalized understanding of instructions provided today and declined a print copy of patient instruction materials.   The Managed Medicaid care management team will reach out to the patient again over the next 30 days.  The  Patient has been provided with contact information for the Managed Medicaid care management team and has been advised to call with any health related questions or concerns.   Aida Raider RN, BSN [place]   [facility] - Managed Medicaid High Risk [phone].   Following is a copy of your plan of care:  Care Plan : General Plan of Care (Adult)  Updates made by Gayla Medicus, RN since 07/05/2021 12:00 AM     Problem: Health Promotion or Disease Self-Management (General Plan of Care)   Priority: Medium  Onset Date: 10/20/2020     Long-Range Goal: Self-Management Plan Developed   Start Date: 07/14/2020  Expected End Date: 10/03/2021  Recent Progress: On track  Priority: Medium  Note:    Current Barriers:  Chronic Disease Management support and education needs related to DM, asthma, glaucoma, chronic pain,HTN, migraines Patient with anxiety and depression-has appointment with Psychiatrist  scheduled every 3 months, therapist every 2 weeks 07/05/21:  Hip and back pain ongoing-has MRI scheduled for today and 07/17/21.  Using meds and TENS unit as needed. Nurse Case Manager Clinical Goal(s):  Over the next 90 days, patient will attend all scheduled medical appointments:  Over the next 30 days, patient will work with CM team pharmacist to review medications. Interventions:  Inter-disciplinary care team collaboration (see longitudinal plan of care) Evaluation of current treatment plan  and patient's adherence to plan as established by provider. Reviewed medications with patient. Collaborated with pharmacy regarding  medications. Discussed plans with patient for ongoing care management follow up and provided patient with direct contact information for care management team Reviewed scheduled/upcoming provider appointments. Collaborated with SW for psychotherapy referral. SW referral for psychotherapy provider. Pharmacy referral for medication review. Self Care Activities: Over the next 90 days, patient will:  -Self administers medications as prescribed Attends all scheduled provider appointments Calls pharmacy for medication refills Calls provider office for new concerns or questions  Follow Up Plan: The Managed Medicaid care management team will reach out to the patient again over the next 30 days.  The patient has been provided with contact information for the Managed Medicaid care management team and has been advised to call with any health related questions or concerns.   Evidence-based guidance:  Review biopsychosocial determinants of health screens.  Determine level of modifiable health risk.  Assess level of patient activation, level of readiness, importance and confidence to make changes.  Evoke change talk using open-ended questions, pros and cons, as well as looking forward.  Identify areas where behavior change may lead to improved health.  Partner with patient to develop a robust self-management plan that includes lifestyle factors, such as weight loss, exercise and healthy nutrition, as well as goals specific to disease risks.  Support patient and family/caregiver active participation in decision-making and self-management plan.  Implement additional goals and interventions based on identified risk factors to reduce health risk.  Facilitate advance care planning.  Review need for preventive screening based on age, sex, family history and health history.

## 2021-07-10 DIAGNOSIS — F4325 Adjustment disorder with mixed disturbance of emotions and conduct: Secondary | ICD-10-CM | POA: Diagnosis not present

## 2021-07-13 NOTE — Telephone Encounter (Signed)
F/u  Fax sent to the plan Your PA has been faxed to the plan as a paper copy. Please contact the plan directly if you haven't received a determination in a typical timeframe.  You will be notified of the determination electronically and via fax.  Melinda [name] (Key: [id number]) Aimovig 140MG /ML auto-injectors   Form Drug Appeal Form Appeal Created 2 months ago Sent to Plan less than a minute ago Determination 25 days ago

## 2021-07-16 ENCOUNTER — Encounter (HOSPITAL_COMMUNITY): Payer: Self-pay | Admitting: *Deleted

## 2021-07-16 ENCOUNTER — Other Ambulatory Visit: Payer: Self-pay

## 2021-07-16 NOTE — Progress Notes (Signed)
[name] denies chest pain or shortness of breath.  Patient denies having any s/s of Covid in her household.  Patient denies any known exposure to Covid.  [name] has  nasal congestion. Patient states that it comes and goes, "it is my allergies I think,it comes and goes," Melinda Smiling Ms reported.  Patient states that she usually take an allergy pill, but it was not with my medications from the pharmacy.  [name] has saline nasal spray, I encouraged patient to use it.  [name] has type II diabetes, patient reports that CBGs run around 130.  I instructed patient to check CBG after awaking and every 2 hours until arrival  to the hospital. I Instructed patient if CBG is less than 70 to take 4 Glucose Tablets or 1 tube of Glucose Gel or 1/2 cup of a clear juice. Recheck CBG in 15 minutes if CBG is not over 70 call, pre- op desk at [phone] for further instructions.   PCP is Dr. [name] Endocrinologist is Dr Buddy Duty.  I informed anesthesiology PA-C of nasal congestion.  I instructed [name] to shower with antibiotic soap, if it is available.  Dry off with a clean towel. Do not put lotion, powder, cologne or deodorant or makeup.No jewelry or piercings. Men may shave their face and neck. Woman should not shave. No nail polish, artificial or acrylic nails. Wear clean clothes, brush your teeth. Glasses, contact lens,dentures or partials may not be worn in the OR. If you need to wear them, please bring a case for glasses, do not wear contacts or bring a case, the hospital does not have contact cases, dentures or partials will have to be removed , make sure they are clean, we will provide a denture cup to put them in. You will need some one to drive you home and a responsible person over the age of 43 to stay with you for the first 24 hours after surgery.

## 2021-07-16 NOTE — Progress Notes (Signed)
Anesthesia Chart Review: Melinda [name]  Case: [id number] Date/Time: 07/17/21 0945   Procedure: MRI LUMBAR SPINE WITHOUT CONTRAST; MRI CERVICAL SPINE WITHOUT CONTRAST WITH ANESTHESIA   Anesthesia type: General   Pre-op diagnosis: DEGENERATIVE DISC DISEASE CERVICAL LUMBAR   Location: [facility] OR ADD ON ROOM 01 / [facility]   Surgeons: Radiologist, Medication, MD       DISCUSSION: Patient is a 56 year old female scheduled for MRI under anesthesia. She currently has orders for MRI abd (had enlargement of fat containing right adrenal on 06/28/21 CT) and MRI lumbar and cervical spines. She had a previously MRI L-spine on 05/11/21 that was a motion degraded exam. Dr. [name] ordered MRI under anesthesia because she was not able to lie still previously.  She has had low back pain, ataxia, and weakness in her left leg. Brain MRI was negative on 05/11/21. Office visit/H&P with Basil Dess, MD on 06/21/21.   History includes former smoker (quit 11/03/14), COPD, asthma, migraines, HTN, DM2 (diagnosed 08/2015, presented in DKA), peripheral neuropathy, uterine fibroids (s/p uterine artery embolization 1/60/10, complicated by sepsis/endometritis), total thyroidectomy (11/17/15 for multi-nodular goiter & papillary thyroid carcinoma; post-surgical hypothyroidism), PE (large bilateral PE with right heart strain 11/08/14), DVT (right femoral DVT 11/08/14; chronic right popliteal DVT and acute superficial vein thrombosis right small saphenous vein 09/04/15), PTSD, ADHD, iron deficiency anemia, GERD, glaucoma, scoliosis, 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  She is on Eliquis for DVT/PE history. A1c 6.1% 04/20/21.   Patient is a same day work-up. She reported some nasal congestion she felt consistent with allergies that "come and go". She denied cough, fever, body aches, GI symptoms. She denied known exposure to COVID.  Discussed with anesthesiologist [place], Belenda Cruise, DO. Will plan for COVID-19 test on arrival. Anesthesia team to further evaluate on the day of surgery. Proceeding as planned will depend on test results and exam findings.    VS: Ht 5\' 10"  (1.778 m)    Wt (!) 156 kg    BMI 49.36 kg/m  BP Readings from Last 3 Encounters:  06/21/21 122/78  05/30/21 122/78  05/12/21 127/77   Pulse Readings from Last 3 Encounters:  06/21/21 98  05/30/21 78  05/12/21 82     PROVIDERS: [name], MD is PCP ([facility]) Delrae Rend, MD is endocrinologist Metta Clines, DO is neurologist Quay Burow, MD is cardiologist. Seen once 02/16/20 for atypical right sided chest pain with negative ED work-up. He suspected this was noncardiac. He did discuss considering coronary calcium scoring. She was self pay and did not ever schedule.     LABS: Most recent lab results include: Lab Results  Component Value Date   WBC 10.5 05/11/2021   HGB 15.0 05/11/2021   HCT 44.0 05/11/2021   PLT 237 05/11/2021   GLUCOSE 120 (H) 05/11/2021   ALT 27 05/11/2021   AST 19 05/11/2021   NA 143 05/11/2021   K 3.5 05/11/2021   CL 110 05/11/2021   CREATININE 1.10 (H) 05/11/2021   BUN 12 05/11/2021   CO2 26 05/11/2021   INR 1.1 05/11/2021   HGBA1C 6.1 04/20/2021    OTHER: Sleep Study 02/11/19: IMPRESSIONS - No significant obstructive sleep apnea occurred during this study (AHI = 1.8/h). - No significant central sleep apnea occurred during this study (CAI = 0.0/h). - Mild oxygen desaturation was noted during this study (Min  O2 = 87.0%). Mean sat 95.1% on room air. - Time with saturation 88% or less was 0.2 minutes. - The patient snored with loud snoring volume. - No cardiac abnormalities were noted during this study. - Clinically significant periodic limb movements did not occur during sleep. No significant associated arousals.  DIAGNOSIS - Primary  snoring RECOMMENDATIONS - Manage for symptoms and snoring based on clinical judgment...  PFTs 04/13/18: FVC 3.27 (93%), post 3.07 (88%). FEV1 2.39 (85%), post 2.44 (83%). DLCO unc 22.53 (72%), cor 20.52 (66%).   IMAGES: CT Abd/pelvis 06/18/21: IMPRESSION: 1. Skin thickening and subcutaneous stranding/edema in the left lower pannus worrisome for infection. No evidence for fluid collection or soft tissue gas. 2. Fat containing right adrenal mass has increased in size compared to the prior study. Recommend further evaluation with MRI.  MRI Brain 05/11/21: IMPRESSION: 1. No acute intracranial abnormality. 2. Mild chronic microvascular ischemic disease for age.   MRI L-spine 05/11/21: IMPRESSION: 1. Motion degraded exam. 2. No acute fracture within the lumbar spine. 3. Advanced degenerative spondylosis with small central disc protrusion at L5-S1, closely approximating and potentially irritating either of the descending S1 nerve roots. 4. Left foraminal to extraforaminal disc protrusions at L2-3 and L3-4, closely approximating and potentially irritating either the exiting left L2 or L3 nerve roots respectively.   CTA Head/Neck 05/11/21: IMPRESSION: - No large vessel occlusion or hemodynamically significant stenosis. - Continued stable appearance of tortuous proximal right PCA with focal dilatation that could reflect a small aneurysm.    EKG: 05/11/21: Sinus rhythm Low voltage, precordial leads Abnormal R-wave progression, early transition Borderline T wave abnormalities Confirmed by Noemi Chapel [phone]) on 05/11/2021 3:27:22 PM   CV: Echo 11/09/14 (in setting of PE, right femoral DVT): Study Conclusions  - Left ventricle: The cavity size was normal. Wall thickness was    normal. Systolic function was mildly reduced. The estimated    ejection fraction was = 50%. Mild diffuse hypokinesis with no    identifiable regional variations. The study is not technically     sufficient to allow evaluation of LV diastolic function.    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 ([place]) AGE 68   HAD TO LEARN TO WALK AGAIN   Iron deficiency anemia due to chronic blood loss 09/09/2014   Low TSH level 09/08/2014   Lumbar herniated disc    Menorrhagia 09/08/2014   Migraine    CLUSTER AND MIGRAINES   Multiple thyroid nodules    Ovarian mass, right 09/09/2014   PE (pulmonary embolism) 2016   Peripheral neuropathy    FINGER TIPS AND TOES NUMB SOME   Preeclampsia 1983   WITH PREGNANCY   PTSD (post-traumatic stress disorder)    Scoliosis    Seizures (HCC) AGE 53   NONE SINCE, HAD CHICKEN POX THEN    Past Surgical History:  Procedure Laterality Date   ANGIOGRAM TO LEG  08-13-15   RIGHT   CHOLECYSTECTOMY     IR GENERIC HISTORICAL  04/04/2016   IR RADIOLOGIST EVAL & MGMT 04/04/2016 [name], MD [facility] INTERV RAD   THYROIDECTOMY N/A 11/17/2015   Procedure: TOTAL THYROIDECTOMY;  Surgeon: Armandina Gemma, MD;  Location: [facility];  Service: General;  Laterality: N/A;   UTERINE ARTERY EMBOLIZATION Bilateral 08/13/2015    MEDICATIONS: No current facility-administered medications for this encounter.    albuterol (PROVENTIL) (2.5 MG/3ML) 0.083% nebulizer solution   albuterol (VENTOLIN HFA) 108 (90 Base) MCG/ACT inhaler   atorvastatin (LIPITOR) 20 MG tablet   buPROPion (WELLBUTRIN XL) 300 MG 24 hr tablet   busPIRone (BUSPAR) 10 MG tablet    Cholecalciferol (VITAMIN D3) 10 MCG (400 UNIT) CAPS   citalopram (CELEXA) 40 MG tablet   cyclobenzaprine (FLEXERIL) 10 MG tablet   doxepin (SINEQUAN) 50 MG capsule   ELIQUIS 5 MG TABS tablet   hydrochlorothiazide (MICROZIDE) 12.5 MG capsule   metoprolol tartrate (LOPRESSOR) 50 MG tablet   montelukast (SINGULAIR) 10 MG tablet   prazosin (MINIPRESS) 2 MG capsule   sodium chloride (OCEAN) 0.65 % SOLN nasal spray   SYNTHROID 200 MCG tablet   topiramate (TOPAMAX) 100 MG tablet   TRULICITY 7.12 RF/7.5OI SOPN   budesonide-formoterol (SYMBICORT) 160-4.5 MCG/ACT inhaler   Erenumab-aooe (AIMOVIG) 140 MG/ML SOAJ   gabapentin (NEURONTIN) 300 MG capsule   triamcinolone cream (KENALOG) 0.1 %    Myra Gianotti, PA-C Surgical Short Stay/Anesthesiology [facility] Phone [phone] [facility] Phone [phone] 07/16/2021 3:57 PM

## 2021-07-16 NOTE — Anesthesia Preprocedure Evaluation (Addendum)
Anesthesia Evaluation  Patient identified by MRN, date of birth, ID band Patient awake    Reviewed: Allergy & Precautions, NPO status , Patient's Chart, lab work & pertinent test results  Airway Mallampati: II  TM Distance: >3 FB Neck ROM: Full    Dental  (+) Edentulous Upper, Edentulous Lower   Pulmonary asthma , COPD,  COPD inhaler, former smoker, PE   Pulmonary exam normal breath sounds clear to auscultation       Cardiovascular hypertension, Pt. on medications and Pt. on home beta blockers + DVT  Normal cardiovascular exam Rhythm:Regular Rate:Normal     Neuro/Psych  Headaches, Seizures -,  PSYCHIATRIC DISORDERS Anxiety PTSD (post-traumatic stress disorder    GI/Hepatic Neg liver ROS, GERD  ,  Endo/Other  diabetes, Oral Hypoglycemic AgentsHypothyroidism Morbid obesity  Renal/GU negative Renal ROS     Musculoskeletal  (+) Arthritis ,   Abdominal (+) + obese,   Peds  (+) ADHD Hematology negative hematology ROS (+)   Anesthesia Other Findings DEGENERATIVE DISC DISEASE CERVICAL AND LUMBAR  Reproductive/Obstetrics                           Anesthesia Physical Anesthesia Plan  ASA: 3  Anesthesia Plan: General   Post-op Pain Management:    Induction: Intravenous  PONV Risk Score and Plan: 3 and Ondansetron, Dexamethasone, Midazolam and Treatment may vary due to age or medical condition  Airway Management Planned: Oral ETT and Video Laryngoscope Planned  Additional Equipment:   Intra-op Plan:   Post-operative Plan: Extubation in OR  Informed Consent: I have reviewed the patients History and Physical, chart, labs and discussed the procedure including the risks, benefits and alternatives for the proposed anesthesia with the patient or authorized representative who has indicated his/her understanding and acceptance.     Dental advisory given  Plan Discussed with:  CRNA  Anesthesia Plan Comments: (Reviewed PAT note written 07/16/2021 by Myra Gianotti, PA-C. She is getting a COVID-19 test on arrival.   )       Anesthesia Quick Evaluation

## 2021-07-17 ENCOUNTER — Encounter (HOSPITAL_COMMUNITY): Admission: RE | Disposition: A | Discharge status: Home / Self Care | Payer: Self-pay | Source: Home / Self Care

## 2021-07-17 ENCOUNTER — Ambulatory Visit (HOSPITAL_COMMUNITY): Payer: Medicaid Other | Admitting: Vascular Surgery

## 2021-07-17 ENCOUNTER — Encounter (HOSPITAL_COMMUNITY): Payer: Self-pay

## 2021-07-17 ENCOUNTER — Ambulatory Visit (HOSPITAL_COMMUNITY)
Admission: RE | Admit: 2021-07-17 | Discharge: 2021-07-17 | Disposition: A | Discharge status: Home / Self Care | Payer: Medicaid Other | Attending: Specialist | Admitting: Specialist

## 2021-07-17 ENCOUNTER — Ambulatory Visit (HOSPITAL_COMMUNITY)
Admission: RE | Admit: 2021-07-17 | Discharge: 2021-07-17 | Disposition: A | Discharge status: Home / Self Care | Payer: Medicaid Other | Source: Ambulatory Visit | Attending: Specialist | Admitting: Specialist

## 2021-07-17 DIAGNOSIS — M4726 Other spondylosis with radiculopathy, lumbar region: Secondary | ICD-10-CM | POA: Diagnosis not present

## 2021-07-17 DIAGNOSIS — E669 Obesity, unspecified: Secondary | ICD-10-CM | POA: Insufficient documentation

## 2021-07-17 DIAGNOSIS — Z86711 Personal history of pulmonary embolism: Secondary | ICD-10-CM | POA: Insufficient documentation

## 2021-07-17 DIAGNOSIS — H409 Unspecified glaucoma: Secondary | ICD-10-CM | POA: Insufficient documentation

## 2021-07-17 DIAGNOSIS — R270 Ataxia, unspecified: Secondary | ICD-10-CM | POA: Insufficient documentation

## 2021-07-17 DIAGNOSIS — M5030 Other cervical disc degeneration, unspecified cervical region: Secondary | ICD-10-CM | POA: Diagnosis not present

## 2021-07-17 DIAGNOSIS — M2578 Osteophyte, vertebrae: Secondary | ICD-10-CM | POA: Insufficient documentation

## 2021-07-17 DIAGNOSIS — R29898 Other symptoms and signs involving the musculoskeletal system: Secondary | ICD-10-CM | POA: Insufficient documentation

## 2021-07-17 DIAGNOSIS — F909 Attention-deficit hyperactivity disorder, unspecified type: Secondary | ICD-10-CM | POA: Insufficient documentation

## 2021-07-17 DIAGNOSIS — M5137 Other intervertebral disc degeneration, lumbosacral region: Secondary | ICD-10-CM | POA: Insufficient documentation

## 2021-07-17 DIAGNOSIS — Z20822 Contact with and (suspected) exposure to covid-19: Secondary | ICD-10-CM | POA: Insufficient documentation

## 2021-07-17 DIAGNOSIS — E1142 Type 2 diabetes mellitus with diabetic polyneuropathy: Secondary | ICD-10-CM | POA: Insufficient documentation

## 2021-07-17 DIAGNOSIS — K219 Gastro-esophageal reflux disease without esophagitis: Secondary | ICD-10-CM | POA: Insufficient documentation

## 2021-07-17 DIAGNOSIS — E890 Postprocedural hypothyroidism: Secondary | ICD-10-CM | POA: Insufficient documentation

## 2021-07-17 DIAGNOSIS — I10 Essential (primary) hypertension: Secondary | ICD-10-CM | POA: Diagnosis not present

## 2021-07-17 DIAGNOSIS — M47816 Spondylosis without myelopathy or radiculopathy, lumbar region: Secondary | ICD-10-CM | POA: Diagnosis not present

## 2021-07-17 DIAGNOSIS — M5091 Cervical disc disorder, unspecified,  high cervical region: Secondary | ICD-10-CM | POA: Diagnosis not present

## 2021-07-17 DIAGNOSIS — Z86718 Personal history of other venous thrombosis and embolism: Secondary | ICD-10-CM | POA: Diagnosis not present

## 2021-07-17 DIAGNOSIS — M47812 Spondylosis without myelopathy or radiculopathy, cervical region: Secondary | ICD-10-CM | POA: Diagnosis not present

## 2021-07-17 DIAGNOSIS — Z7901 Long term (current) use of anticoagulants: Secondary | ICD-10-CM | POA: Insufficient documentation

## 2021-07-17 DIAGNOSIS — Z01818 Encounter for other preprocedural examination: Secondary | ICD-10-CM

## 2021-07-17 DIAGNOSIS — Z6841 Body Mass Index (BMI) 40.0 and over, adult: Secondary | ICD-10-CM | POA: Insufficient documentation

## 2021-07-17 DIAGNOSIS — M5416 Radiculopathy, lumbar region: Secondary | ICD-10-CM | POA: Diagnosis not present

## 2021-07-17 DIAGNOSIS — M5136 Other intervertebral disc degeneration, lumbar region: Secondary | ICD-10-CM | POA: Insufficient documentation

## 2021-07-17 DIAGNOSIS — M4802 Spinal stenosis, cervical region: Secondary | ICD-10-CM

## 2021-07-17 DIAGNOSIS — J449 Chronic obstructive pulmonary disease, unspecified: Secondary | ICD-10-CM | POA: Insufficient documentation

## 2021-07-17 DIAGNOSIS — F4310 Post-traumatic stress disorder, unspecified: Secondary | ICD-10-CM | POA: Diagnosis not present

## 2021-07-17 DIAGNOSIS — M419 Scoliosis, unspecified: Secondary | ICD-10-CM | POA: Diagnosis not present

## 2021-07-17 DIAGNOSIS — D509 Iron deficiency anemia, unspecified: Secondary | ICD-10-CM | POA: Diagnosis not present

## 2021-07-17 DIAGNOSIS — G43909 Migraine, unspecified, not intractable, without status migrainosus: Secondary | ICD-10-CM | POA: Insufficient documentation

## 2021-07-17 DIAGNOSIS — M545 Low back pain, unspecified: Secondary | ICD-10-CM | POA: Diagnosis not present

## 2021-07-17 DIAGNOSIS — M542 Cervicalgia: Secondary | ICD-10-CM

## 2021-07-17 DIAGNOSIS — M51369 Other intervertebral disc degeneration, lumbar region without mention of lumbar back pain or lower extremity pain: Secondary | ICD-10-CM

## 2021-07-17 HISTORY — PX: RADIOLOGY WITH ANESTHESIA: SHX6223

## 2021-07-17 LAB — SARS CORONAVIRUS 2 BY RT PCR (HOSPITAL ORDER, PERFORMED IN ~~LOC~~ HOSPITAL LAB): SARS Coronavirus 2: NEGATIVE

## 2021-07-17 LAB — BASIC METABOLIC PANEL
Anion gap: 10 (ref 5–15)
BUN: 9 mg/dL (ref 6–20)
CO2: 24 mmol/L (ref 22–32)
Calcium: 8.8 mg/dL — ABNORMAL LOW (ref 8.9–10.3)
Chloride: 107 mmol/L (ref 98–111)
Creatinine, Ser: 1.19 mg/dL — ABNORMAL HIGH (ref 0.44–1.00)
GFR, Estimated: 55 mL/min — ABNORMAL LOW (ref >60)
Glucose, Bld: 137 mg/dL — ABNORMAL HIGH (ref 70–99)
Potassium: 3.2 mmol/L — ABNORMAL LOW (ref 3.5–5.1)
Sodium: 141 mmol/L (ref 135–145)

## 2021-07-17 LAB — GLUCOSE, CAPILLARY
Glucose-Capillary: 156 mg/dL — ABNORMAL HIGH (ref 70–99)
Glucose-Capillary: 135 mg/dL — ABNORMAL HIGH (ref 70–99)

## 2021-07-17 LAB — HCG, SERUM, QUALITATIVE: Preg, Serum: NEGATIVE

## 2021-07-17 SURGERY — MRI WITH ANESTHESIA
Anesthesia: General

## 2021-07-17 MED ORDER — ONDANSETRON HCL 4 MG/2ML IJ SOLN
INTRAMUSCULAR | Status: DC | PRN
  Administered 2021-07-17: 4 mg via INTRAVENOUS

## 2021-07-17 MED ORDER — FENTANYL CITRATE (PF) 100 MCG/2ML IJ SOLN: 25.0000 ug | INTRAMUSCULAR | Status: DC | PRN

## 2021-07-17 MED ORDER — CHLORHEXIDINE GLUCONATE 0.12 % MT SOLN
OROMUCOSAL | Status: AC
  Administered 2021-07-17: 15 mL via OROMUCOSAL
  Filled 2021-07-17: qty 15

## 2021-07-17 MED ORDER — OXYCODONE HCL 5 MG/5ML PO SOLN: 5.0000 mg | Freq: Once | ORAL | Status: DC | PRN

## 2021-07-17 MED ORDER — ACETAMINOPHEN 10 MG/ML IV SOLN: 1000.0000 mg | Freq: Once | INTRAVENOUS | Status: DC | PRN

## 2021-07-17 MED ORDER — CHLORHEXIDINE GLUCONATE 0.12 % MT SOLN
15.0000 mL | OROMUCOSAL | Status: AC
  Filled 2021-07-17: qty 15

## 2021-07-17 MED ORDER — PROPOFOL 10 MG/ML IV BOLUS
INTRAVENOUS | Status: DC | PRN
  Administered 2021-07-17: 100 mg via INTRAVENOUS

## 2021-07-17 MED ORDER — ROCURONIUM BROMIDE 10 MG/ML (PF) SYRINGE
PREFILLED_SYRINGE | INTRAVENOUS | Status: DC | PRN
  Administered 2021-07-17: 35 mg via INTRAVENOUS

## 2021-07-17 MED ORDER — LACTATED RINGERS IV SOLN: INTRAVENOUS | Status: DC

## 2021-07-17 MED ORDER — KETOROLAC TROMETHAMINE 30 MG/ML IJ SOLN: 30.0000 mg | Freq: Once | INTRAMUSCULAR | Status: DC

## 2021-07-17 MED ORDER — AMISULPRIDE (ANTIEMETIC) 5 MG/2ML IV SOLN: 10.0000 mg | Freq: Once | INTRAVENOUS | Status: DC | PRN

## 2021-07-17 MED ORDER — LIDOCAINE 2% (20 MG/ML) 5 ML SYRINGE
INTRAMUSCULAR | Status: DC | PRN
  Administered 2021-07-17: 100 mg via INTRAVENOUS

## 2021-07-17 MED ORDER — PHENYLEPHRINE HCL-NACL 20-0.9 MG/250ML-% IV SOLN
INTRAVENOUS | Status: DC | PRN
  Administered 2021-07-17: 40 ug/min via INTRAVENOUS

## 2021-07-17 MED ORDER — PHENYLEPHRINE 40 MCG/ML (10ML) SYRINGE FOR IV PUSH (FOR BLOOD PRESSURE SUPPORT)
PREFILLED_SYRINGE | INTRAVENOUS | Status: DC | PRN
  Administered 2021-07-17: 80 ug via INTRAVENOUS
  Administered 2021-07-17 (×3): 120 ug via INTRAVENOUS

## 2021-07-17 MED ORDER — FENTANYL CITRATE (PF) 250 MCG/5ML IJ SOLN
INTRAMUSCULAR | Status: DC | PRN
Start: 2021-07-17 — End: 2021-07-17
  Administered 2021-07-17: 100 ug via INTRAVENOUS

## 2021-07-17 MED ORDER — OXYCODONE HCL 5 MG PO TABS: 5.0000 mg | ORAL_TABLET | Freq: Once | ORAL | Status: DC | PRN

## 2021-07-17 MED ORDER — MIDAZOLAM HCL 2 MG/2ML IJ SOLN
INTRAMUSCULAR | Status: DC | PRN
  Administered 2021-07-17: 2 mg via INTRAVENOUS

## 2021-07-17 MED ORDER — DEXAMETHASONE SODIUM PHOSPHATE 10 MG/ML IJ SOLN
INTRAMUSCULAR | Status: DC | PRN
  Administered 2021-07-17: 5 mg via INTRAVENOUS

## 2021-07-17 MED ORDER — SUGAMMADEX SODIUM 200 MG/2ML IV SOLN
INTRAVENOUS | Status: DC | PRN
  Administered 2021-07-17: 200 mg via INTRAVENOUS

## 2021-07-17 MED ORDER — SUCCINYLCHOLINE CHLORIDE 200 MG/10ML IV SOSY
PREFILLED_SYRINGE | INTRAVENOUS | Status: DC | PRN
  Administered 2021-07-17: 140 mg via INTRAVENOUS

## 2021-07-17 NOTE — Progress Notes (Addendum)
Patient states her nephew is going to be the one to stay with her for the next 24 hours. Pam is her friend that will be picking her up and transporting her home.

## 2021-07-17 NOTE — Anesthesia Postprocedure Evaluation (Signed)
Anesthesia Post Note  Patient: Melinda [name]  Procedure(s) Performed: MRI LUMBAR SPINE WITHOUT CONTRAST; MRI CERVICAL SPINE WITHOUT CONTRAST WITH ANESTHESIA     Patient location during evaluation: PACU Anesthesia Type: General Level of consciousness: awake Pain management: pain level controlled Vital Signs Assessment: post-procedure vital signs reviewed and stable Respiratory status: spontaneous breathing, nonlabored ventilation, respiratory function stable and patient connected to nasal cannula oxygen Cardiovascular status: blood pressure returned to baseline and stable Postop Assessment: no apparent nausea or vomiting Anesthetic complications: no   No notable events documented.  Last Vitals:  Vitals:   07/17/21 1200 07/17/21 1215  BP: 106/61 121/75  Pulse: 90 84  Resp: 18 12  Temp:  36.7 C  SpO2: 93% 93%    Last Pain:  Vitals:   07/17/21 1215  TempSrc:   PainSc: 0-No pain                 Azari Hasler P Atiyah Bauer

## 2021-07-17 NOTE — Progress Notes (Signed)
Patient resting quietly in recliner continues to await for family to transport home skin warm and dry resp even unlabored

## 2021-07-17 NOTE — Transfer of Care (Signed)
Immediate Anesthesia Transfer of Care Note  Patient: Melinda [name]  Procedure(s) Performed: MRI LUMBAR SPINE WITHOUT CONTRAST; MRI CERVICAL SPINE WITHOUT CONTRAST WITH ANESTHESIA  Patient Location: PACU  Anesthesia Type:General  Level of Consciousness: patient cooperative and responds to stimulation  Airway & Oxygen Therapy: Patient Spontanous Breathing and Patient connected to nasal cannula oxygen  Post-op Assessment: Report given to RN, Post -op Vital signs reviewed and stable and Patient moving all extremities  Post vital signs: Reviewed and stable  Last Vitals:  Vitals Value Taken Time  BP 113/60 07/17/21 1143  Temp    Pulse 90 07/17/21 1144  Resp 13 07/17/21 1144  SpO2 93 % 07/17/21 1144  Vitals shown include unvalidated device data.  Last Pain:  Vitals:   07/17/21 0847  TempSrc:   PainSc: 0-No pain      Patients Stated Pain Goal: 0 (83/46/21 9471)  Complications: No notable events documented.

## 2021-07-17 NOTE — Anesthesia Procedure Notes (Signed)
Procedure Name: Intubation Date/Time: 07/17/2021 10:27 AM Performed by: Betha Loa, CRNA Pre-anesthesia Checklist: Patient identified, Emergency Drugs available, Suction available and Patient being monitored Patient Re-evaluated:Patient Re-evaluated prior to induction Oxygen Delivery Method: Circle System Utilized Preoxygenation: Pre-oxygenation with 100% oxygen Induction Type: IV induction Laryngoscope Size: Mac and 4 Grade View: Grade I Tube type: Oral Tube size: 7.0 mm Number of attempts: 1 Airway Equipment and Method: Stylet, Oral airway and Video-laryngoscopy Placement Confirmation: ETT inserted through vocal cords under direct vision, positive ETCO2 and breath sounds checked- equal and bilateral Secured at: 22 cm Tube secured with: Tape Dental Injury: Teeth and Oropharynx as per pre-operative assessment  Difficulty Due To: Difficulty was anticipated, Difficult Airway- due to large tongue and Difficult Airway- due to reduced neck mobility

## 2021-07-18 ENCOUNTER — Encounter (HOSPITAL_COMMUNITY): Payer: Self-pay | Admitting: Radiology

## 2021-07-20 ENCOUNTER — Other Ambulatory Visit: Payer: Self-pay

## 2021-07-20 ENCOUNTER — Ambulatory Visit (INDEPENDENT_AMBULATORY_CARE_PROVIDER_SITE_OTHER): Payer: Medicaid Other | Admitting: Specialist

## 2021-07-20 ENCOUNTER — Encounter: Payer: Self-pay | Admitting: Specialist

## 2021-07-20 VITALS — BP 111/70 | HR 93 | Ht 70.0 in | Wt 344.0 lb

## 2021-07-20 DIAGNOSIS — M4726 Other spondylosis with radiculopathy, lumbar region: Secondary | ICD-10-CM

## 2021-07-20 DIAGNOSIS — M778 Other enthesopathies, not elsewhere classified: Secondary | ICD-10-CM

## 2021-07-20 DIAGNOSIS — Z6841 Body Mass Index (BMI) 40.0 and over, adult: Secondary | ICD-10-CM | POA: Diagnosis not present

## 2021-07-20 DIAGNOSIS — M542 Cervicalgia: Secondary | ICD-10-CM | POA: Diagnosis not present

## 2021-07-20 DIAGNOSIS — M47812 Spondylosis without myelopathy or radiculopathy, cervical region: Secondary | ICD-10-CM

## 2021-07-20 DIAGNOSIS — M4802 Spinal stenosis, cervical region: Secondary | ICD-10-CM

## 2021-07-20 DIAGNOSIS — M5030 Other cervical disc degeneration, unspecified cervical region: Secondary | ICD-10-CM

## 2021-07-20 MED ORDER — ACETAMINOPHEN 500 MG PO TABS: 500.0000 mg | ORAL_TABLET | Freq: Four times a day (QID) | ORAL | 0 refills | Status: DC | PRN

## 2021-07-20 NOTE — Patient Instructions (Signed)
Plan: Avoid overhead lifting and overhead use of the arms. Do not lift greater than 5 lbs. Adjust head rest in vehicle to prevent hyperextension if rear ended. Take extra precautions to avoid falling, including use of a cane if you feel weak. Avoid bending, stooping and avoid lifting weights greater than 10 lbs. Avoid prolong standing and walking. Avoid frequent bending and stooping  No lifting greater than 10 lbs. May use ice or moist heat for pain. Weight loss is of benefit. Handicap license is approved. Dr. [name] secretary/Assistant will call to arrange for epidural steroid injection   PT for neck and bilateral shoulders.

## 2021-07-20 NOTE — Progress Notes (Signed)
Office Visit Note   Patient: Melinda [name]           Date of Birth: February 10, 1966           MRN: [id number] Visit Date: 07/20/2021              Requested by: [name], MD [address] [place],  [place] [address] PCP: [name], MD   Assessment & Plan: Visit Diagnoses:  1. Spinal stenosis of cervical region   2. Other spondylosis with radiculopathy, lumbar region   3. Degenerative disc disease, cervical   4. Cervicalgia   5. Body mass index 50.0-59.9, adult (HCC)   6. Spondylosis of cervical spine     Plan: Avoid overhead lifting and overhead use of the arms. Do not lift greater than 5 lbs. Adjust head rest in vehicle to prevent hyperextension if rear ended. Take extra precautions to avoid falling, including use of a cane if you feel weak. Avoid bending, stooping and avoid lifting weights greater than 10 lbs. Avoid prolong standing and walking. Avoid frequent bending and stooping  No lifting greater than 10 lbs. May use ice or moist heat for pain. Weight loss is of benefit. Handicap license is approved. Dr. [name] secretary/Assistant will call to arrange for epidural steroid injection   PT for neck and bilateral shoulders.  Follow-Up Instructions: No follow-ups on file.   Orders:  No orders of the defined types were placed in this encounter.  No orders of the defined types were placed in this encounter.     Procedures: No procedures performed   Clinical Data: No additional findings.   Subjective: Chief Complaint  Patient presents with   Neck - Follow-up    MRI Review   Lower Back - Follow-up    MRI Review    56 year old female with history of low back pain and difficulty walking. She has history of both neck and low back pain. The neck pain is intermittant, had a complete thyroidectomy, head feels too heavy for my neck. Has had 1-2 episodes of bowel and bladder incontinence. Has dizziness with looking up or down. Her right arm sometimes  feels weak. There is some  Left there is numbness sometimes and right arm pain. Has had left RC injury in the past, no treatment, left shoulder  Had an ultrasound by Dr. [name]. She has been to some PT in the past without help for the neck or the lower back some. Can only go to so much therapy with medicaid. Its been a while ago.    Review of Systems  Constitutional:  Positive for fatigue.  HENT:  Positive for congestion, rhinorrhea, sinus pain and sneezing.   Eyes: Negative.   Respiratory:  Positive for shortness of breath.   Cardiovascular:  Positive for leg swelling. Negative for chest pain and palpitations.  Gastrointestinal:  Positive for abdominal distention (left abdomenal wall swelling.) and abdominal pain (Pain at the belly button).  Endocrine: Negative.   Genitourinary: Negative.   Musculoskeletal:  Positive for back pain.  Skin: Negative.   Allergic/Immunologic: Negative.   Neurological: Negative.   Hematological: Negative.   Psychiatric/Behavioral: Negative.      Objective: Vital Signs: BP 111/70 (BP Location: Left Arm, Patient Position: Sitting)    Pulse 93    Ht 5\' 10"  (1.778 m)    Wt (!) 344 lb (156 kg)    SpO2 94%    BMI 49.36 kg/m   Physical Exam Constitutional:  Appearance: She is well-developed.  HENT:     Head: Normocephalic and atraumatic.  Eyes:     Pupils: Pupils are equal, round, and reactive to light.  Pulmonary:     Effort: Pulmonary effort is normal.     Breath sounds: Normal breath sounds.  Abdominal:     General: Bowel sounds are normal.     Palpations: Abdomen is soft.  Musculoskeletal:     Cervical back: Normal range of motion and neck supple.     Lumbar back: Negative right straight leg raise test and negative left straight leg raise test.  Skin:    General: Skin is warm and dry.  Neurological:     Mental Status: She is alert and oriented to person, place, and time.  Psychiatric:        Behavior: Behavior normal.        Thought  Content: Thought content normal.        Judgment: Judgment normal.    Back Exam   Tenderness  The patient is experiencing tenderness in the cervical and lumbar.  Range of Motion  Extension:  abnormal  Flexion:  abnormal  Lateral bend right:  abnormal  Lateral bend left:  abnormal  Rotation right:  abnormal  Rotation left:  abnormal   Muscle Strength  Right Quadriceps:  5/5  Left Quadriceps:  5/5  Right Hamstrings:  5/5  Left Hamstrings:  5/5   Tests  Straight leg raise right: negative Straight leg raise left: negative  Reflexes  Patellar:  0/4 Achilles:  0/4  Other  Toe walk: abnormal Heel walk: abnormal  Comments:  Left foot DF weakness Left thigh and leg is edematous and she has noted swelling left arm and hand as well.     Specialty Comments:  No specialty comments available.  Imaging: No results found.   PMFS History: Patient Active Problem List   Diagnosis Date Noted   At high risk for falls 04/20/2020   Hyperlipidemia 02/16/2020   Adrenal mass, right ([place]) 04/29/2019   Chronic cough 03/12/2018   Left shoulder pain 10/14/2016   Hypertension 10/14/2016   S/P thyroidectomy 06/27/2016   Chronic bilateral low back pain without sciatica 04/22/2016   Liver lesion 12/24/2015   Controlled type 2 diabetes mellitus with complication, with long-term current use of insulin ([place]) 10/10/2015   Vision loss 08/25/2015   Decreased vision in both eyes 08/25/2015   Prolonged Q-T interval on ECG 08/19/2015   History of pulmonary embolism 08/10/2015   Mild persistent asthma 04/13/2015   Fibroid, uterine    Morbid obesity ([place]) 11/11/2014   Atypical chest pain 11/08/2014   Sinus tachycardia 11/08/2014   Migraine 10/14/2014   Menorrhagia 09/08/2014   Past Medical History:  Diagnosis Date   ADHD (attention deficit hyperactivity disorder)    Arthritis    Asthma    Cancer ([place])    Thyroid   Chicken pox AGE 13   COPD (chronic obstructive pulmonary disease)  ([place])    pt reported   Diabetes mellitus without complication (HCC)    Diverticulosis, sigmoid    GERD (gastroesophageal reflux disease)    Glaucoma    BOTH EYES, NO EYE DROPS   Glaucoma    History of blood transfusion 08/2014    2UNITS GIVEN AND IRON GIVEN   Hyperglycemia 09/09/2014   Hypertension    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 ([place]) AGE 3   HAD TO LEARN TO WALK AGAIN   Iron deficiency  anemia due to chronic blood loss 09/09/2014   Low TSH level 09/08/2014   Lumbar herniated disc    Menorrhagia 09/08/2014   Migraine    CLUSTER AND MIGRAINES   Multiple thyroid nodules    Ovarian mass, right 09/09/2014   PE (pulmonary embolism) 2016   Peripheral neuropathy    FINGER TIPS AND TOES NUMB SOME   Preeclampsia 1983   WITH PREGNANCY   PTSD (post-traumatic stress disorder)    Scoliosis    Seizures ([place]) AGE 88   NONE SINCE, HAD CHICKEN POX THEN    Family History  Problem Relation Age of Onset   Diabetes Mother    Breast cancer Mother    CAD Mother    Hypertension Mother    Alcohol abuse Father    Hypertension Father    Breast cancer Maternal Aunt    Breast cancer Maternal Aunt     Past Surgical History:  Procedure Laterality Date   ANGIOGRAM TO LEG  08-13-15   RIGHT   CHOLECYSTECTOMY     IR GENERIC HISTORICAL  04/04/2016   IR RADIOLOGIST EVAL & MGMT 04/04/2016 [name], MD [facility] INTERV RAD   RADIOLOGY WITH ANESTHESIA N/A 07/17/2021   Procedure: MRI LUMBAR SPINE WITHOUT CONTRAST; MRI CERVICAL SPINE WITHOUT CONTRAST WITH ANESTHESIA;  Surgeon: Radiologist, Medication, MD;  Location: [facility];  Service: Radiology;  Laterality: N/A;   THYROIDECTOMY N/A 11/17/2015   Procedure: TOTAL THYROIDECTOMY;  Surgeon: Armandina Gemma, MD;  Location: [facility];  Service: General;  Laterality: N/A;   UTERINE ARTERY EMBOLIZATION Bilateral 08/13/2015   Social History   Occupational History   Occupation: disabled  Tobacco Use   Smoking status: Former     Packs/day: 0.50    Years: 40.00    Pack years: 20.00    Types: Cigarettes    Quit date: 11/03/2014    Years since quitting: 6.7   Smokeless tobacco: Never  Vaping Use   Vaping Use: Never used  Substance and Sexual Activity   Alcohol use: No    Alcohol/week: 0.0 standard drinks   Drug use: No   Sexual activity: Never    Birth control/protection: None

## 2021-07-23 ENCOUNTER — Encounter: Payer: Self-pay | Admitting: Internal Medicine

## 2021-07-27 ENCOUNTER — Other Ambulatory Visit: Payer: Self-pay

## 2021-07-31 ENCOUNTER — Other Ambulatory Visit: Payer: Self-pay | Admitting: Obstetrics and Gynecology

## 2021-07-31 NOTE — Patient Outreach (Signed)
Care Coordination  07/31/2021  Verdean Murin 17-Jan-1966 [id number]   Medicaid Managed Care   Unsuccessful Outreach Note  07/31/2021 Name: Esma Kilts MRN: [id number] DOB: 25-Dec-1965  Referred by: [name], MD Reason for referral : High Risk Managed Medicaid (Unsuccessful telephone outreach)   An unsuccessful telephone outreach was attempted today. The patient was referred to the case management team for assistance with care management and care coordination.   Follow Up Plan: The care management team will reach out to the patient again over the next 7 days.   Aida Raider RN, BSN [place]   [facility] - Managed Medicaid High Risk [phone]

## 2021-07-31 NOTE — Patient Instructions (Signed)
Hi Ms. Kilbride, I am sorry I missed you today, I hope you are doing okay- as a part of your Medicaid benefit, you are eligible for care management and care coordination services at no cost or copay. I was unable to reach you by phone today but would be happy to help you with your health related needs. Please feel free to call me at [phone].  A member of the Managed Medicaid care management team will reach out to you again over the next 7 days.   Aida Raider RN, BSN [place]   [facility] - Managed Medicaid High Risk [phone]

## 2021-08-01 ENCOUNTER — Telehealth: Payer: Self-pay | Admitting: Internal Medicine

## 2021-08-01 DIAGNOSIS — E278 Other specified disorders of adrenal gland: Secondary | ICD-10-CM

## 2021-08-01 NOTE — Telephone Encounter (Signed)
Phone call placed to radiologist Dr. Ronney Asters this afternoon.  I had received a message through my CMA from one of the radiology techs informing me that they were unable to do the MRI of the abdomen that the radiologist had recommended to further evaluate the right adrenal mass.  The note that I received stated that the coil could not fit probably around the patient's abdomen due to her body size.  It was recommended that a repeat CAT scan be done instead. Spoke with Dr. Dagoberto Reef with question of whether just repeating a CAT scan would be adequate to evaluate the adrenal.  She states that because the MRI cannot be done in a manner that would give good images of the adrenal gland due to patient's body size, she would recommend doing an adrenal CAT scan and that it should be just as good.  I question whether it would be okay for [place] to give contrast with the patient's GFR of 55.  She thinks this would be okay.

## 2021-08-03 ENCOUNTER — Other Ambulatory Visit: Payer: Self-pay

## 2021-08-03 ENCOUNTER — Other Ambulatory Visit: Payer: Self-pay | Admitting: Obstetrics and Gynecology

## 2021-08-03 NOTE — Patient Instructions (Signed)
Hi Melinda [name] for speaking to [facility] a good afternoon and weekend!  Melinda [name] was given information about Medicaid Managed Care team care coordination services as a part of their [place] Medicaid benefit. Melinda [name] verbally consented to engagement with the [facility] Managed Care team.   If you are experiencing a medical emergency, please call 911 or report to your local emergency department or urgent care.   If you have a non-emergency medical problem during routine business hours, please contact your provider's office and ask to speak with a nurse.   For questions related to your [facility], please call: [phone] or visit the homepage here: [email or web address]  If you would like to schedule transportation through your [facility], please call the following number at least 2 days in advance of your appointment: [phone].   Call the [place] at [phone], at any time, 24 hours a day, 7 days a week. If you are in danger or need immediate medical attention call 911.  If you would like help to quit smoking, call 1-800-QUIT-NOW 6404075652) OR Espaol: 1-855-Djelo-Ya (1-829-937-1696) o para ms informacin haga clic aqu or Text READY to 200-400 to register via text  Melinda [name] - following are the goals we discussed in your visit today:   Goals Addressed             This Visit's Progress    Protect My Health         Timeframe:  Long-Range Goal Priority:  Medium Start Date:    07/14/20                         Expected End Date:  ongoing          Follow Up Date: 08/31/21 - schedule appointment for vaccines needed due to my age or health - schedule recommended health tests (blood work, mammogram, colonoscopy, pap test) - schedule and keep appointment for annual check-up   08/03/21:  Patient to start PT 08/06/21, has follow up  appointments with Dr. [name] and PCP.   The patient verbalized understanding of instructions provided today and declined a print copy of patient instruction materials.   The Managed Medicaid care management team will reach out to the patient again over the next 30 days.  The  Patient has been provided with contact information for the Managed Medicaid care management team and has been advised to call with any health related questions or concerns.   Aida Raider RN, BSN [place]   [facility] - Managed Medicaid High Risk [phone].   Following is a copy of your plan of care:  Care Plan : General Plan of Care (Adult)  Updates made by Gayla Medicus, RN since 08/03/2021 12:00 AM     Problem: Health Promotion or Disease Self-Management (General Plan of Care)   Priority: Medium  Onset Date: 10/20/2020     Long-Range Goal: Self-Management Plan Developed   Start Date: 07/14/2020  Expected End Date: 10/03/2021  Recent Progress: On track  Priority: Medium  Note:    Current Barriers:  Chronic Disease Management support and education needs related to DM, asthma, glaucoma, chronic pain,HTN, migraines Patient with anxiety and depression-has appointment with Psychiatrist  scheduled every 3 months, therapist every 2 weeks 08/03/21:  Patient without specific complaint today, PT scheduled to start 08/06/21-has follow up appointments scheduled. Nurse Case Manager Clinical Goal(s):  Over  the next 90 days, patient will attend all scheduled medical appointments:  Over the next 30 days, patient will work with CM team pharmacist to review medications. Interventions:  Inter-disciplinary care team collaboration (see longitudinal plan of care) Evaluation of current treatment plan  and patient's adherence to plan as established by provider. Reviewed medications with patient. Collaborated with pharmacy regarding medications. Discussed plans with patient for ongoing care  management follow up and provided patient with direct contact information for care management team Reviewed scheduled/upcoming provider appointments. Collaborated with SW for psychotherapy referral. SW referral for psychotherapy provider. Pharmacy referral for medication review. Self Care Activities: Over the next 90 days, patient will:  -Self administers medications as prescribed Attends all scheduled provider appointments Calls pharmacy for medication refills Calls provider office for new concerns or questions  Follow Up Plan: The Managed Medicaid care management team will reach out to the patient again over the next 30 days.  The patient has been provided with contact information for the Managed Medicaid care management team and has been advised to call with any health related questions or concerns.   Evidence-based guidance:  Review biopsychosocial determinants of health screens.  Determine level of modifiable health risk.  Assess level of patient activation, level of readiness, importance and confidence to make changes.  Evoke change talk using open-ended questions, pros and cons, as well as looking forward.  Identify areas where behavior change [name] lead to improved health.  Partner with patient to develop a robust self-management plan that includes lifestyle factors, such as weight loss, exercise and healthy nutrition, as well as goals specific to disease risks.  Support patient and family/caregiver active participation in decision-making and self-management plan.  Implement additional goals and interventions based on identified risk factors to reduce health risk.  Facilitate advance care planning.  Review need for preventive screening based on age, sex, family history and health history.

## 2021-08-03 NOTE — Patient Outreach (Signed)
Medicaid Managed Care   Nurse Care Manager Note  08/03/2021 Name:  Melinda [name] MRN:  [id number] DOB:  [birth year]  Melinda [name] is an 56 y.o. year old female who is a primary patient of Melinda Pier, MD.  The [facility] Managed Care Coordination team was consulted for assistance with:    Chronic healthcare management needs.  Melinda [name] was given information about Medicaid Managed Care Coordination team services today. Melinda [name] Patient agreed to services and verbal consent obtained.  Engaged with patient by telephone for follow up visit in response to provider referral for case management and/or care coordination services.   Assessments/Interventions:  Review of past medical history, allergies, medications, health status, including review of consultants reports, laboratory and other test data, was performed as part of comprehensive evaluation and provision of chronic care management services.  SDOH (Social Determinants of Health) assessments and interventions performed: SDOH Interventions    Flowsheet Row Most Recent Value  SDOH Interventions   Food Insecurity Interventions Intervention Not Indicated  Physical Activity Interventions --  [not able to engage in stenuous exercise]       Care Plan  Allergies  Allergen Reactions   Ceftriaxone Anaphylaxis    ROCEPHIN   Metformin Anaphylaxis    ALL   Penicillins Shortness Of Breath    Has patient had a PCN reaction causing immediate rash, facial/tongue/throat swelling, SOB or lightheadedness with hypotension: Yes Has patient had a PCN reaction causing severe rash involving mucus membranes or skin necrosis: No Has patient had a PCN reaction that required hospitalization No Has patient had a PCN reaction occurring within the last 10 years: No If all of the above answers are "NO", then [name] proceed with Cephalosporin use.    Shellfish Allergy Anaphylaxis   Flonase [Fluticasone Propionate]     Makes migraines worse   Gold-Containing  Drug Products     HANDS ITCH   Nickel     HANDS SWELL   Citrus Rash    Medications Reviewed Today     Reviewed by Melinda Medicus, RN (Registered Nurse) on 08/03/21 at 1250  Med List Status: <None>   Medication Order Taking? Sig Documenting Provider Last Dose Status Informant  acetaminophen (TYLENOL) 500 MG tablet [id number] No Take 1 tablet (500 mg total) by mouth every 6 (six) hours as needed. Melinda Oto, MD Taking Active   albuterol (PROVENTIL) (2.5 MG/3ML) 0.083% nebulizer solution [id number] No Take 3 mLs (2.5 mg total) by nebulization every 6 (six) hours as needed for wheezing or shortness of breath.  Patient not taking: Reported on 07/27/2021   Melinda Pier, MD Not Taking Active Self  albuterol (VENTOLIN HFA) 108 (90 Base) MCG/ACT inhaler [id number] No Inhale 2 puffs into the lungs every 6 (six) hours as needed for wheezing or shortness of breath. Melinda Pier, MD Taking Active Self  atorvastatin (LIPITOR) 20 MG tablet [id number] No TAKE ONE TABLET BY MOUTH ONCE DAILY AT BEDTIME Melinda Pier, MD Taking Active Self  budesonide-formoterol [facility]) 160-4.5 MCG/ACT inhaler [id number] No Inhale 2 puffs into the lungs 2 (two) times daily. Melinda Pier, MD Taking Active Self  buPROPion (WELLBUTRIN XL) 300 MG 24 hr tablet [id number] No Take 300 mg by mouth daily.  [provider] Taking Active Self  busPIRone (BUSPAR) 10 MG tablet [id number] No Take 10 mg by mouth 2 (two) times daily. [provider] Taking Active Self           Med Note (ABDAL-RAFI, [facility]  Wed Feb 18, 2018 10:03 PM)    Cholecalciferol (VITAMIN D3) 10 MCG (400 UNIT) CAPS [id number] No TAKE 1 CAPSULE (400 UNITS TOTAL) BY MOUTH DAILY (AM) Melinda Pier, MD Taking Active Self  citalopram (CELEXA) 40 MG tablet [id number] No Take 40 mg by mouth daily.  [provider] Taking Active Self  cyclobenzaprine (FLEXERIL) 10 MG tablet [id number] No TAKE 1 TABLET (10 MG TOTAL) BY  MOUTH 3 (THREE) TIMES DAILY AS NEEDED FOR MUSCLE SPASMS. Melinda Pier, MD Taking Active Self  doxepin (SINEQUAN) 50 MG capsule [id number] No Take 150 mg by mouth at bedtime. [provider] Taking Active Self  ELIQUIS 5 MG TABS tablet [id number] No TAKE ONE TABLET BY MOUTH TWICE DAILY MORNING AND Melinda Rubins, MD Taking Active Self  Erenumab-aooe (AIMOVIG) 140 MG/ML SOAJ [id number] No Inject 140 mg into the skin every 28 (twenty-eight) days.  Patient not taking: Reported on 07/27/2021   Melinda Partridge, DO Not Taking Active Self  gabapentin (NEURONTIN) 300 MG capsule [id number] No TAKE ONE CAPSULE BY MOUTH THREE TIMES DAILY (AM+NOON+BEDTIME) Melinda Pier, MD Taking Active   hydrochlorothiazide (MICROZIDE) 12.5 MG capsule [id number] No TAKE ONE CAPSULE BY MOUTH ONCE DAILY(AM) Melinda Pier, MD Taking Active Self  metoprolol tartrate (LOPRESSOR) 50 MG tablet [id number] No TAKE ONE TABLET BY MOUTH TWICE DAILY (AM+BEDTIME) Melinda Pier, MD Taking Active Self  montelukast (SINGULAIR) 10 MG tablet [id number] No TAKE 1 TABLET BY MOUTH EVERY EVENING AT BEDTIME Melinda Pier, MD Taking Active Self  prazosin (MINIPRESS) 2 MG capsule [id number] No Take 6 mg by mouth at bedtime. [provider] Taking Active Self  sodium chloride (OCEAN) 0.65 % SOLN nasal spray [id number] No Place 1 spray into both nostrils as needed for congestion. Melinda Pier, MD 07/16/2021 Active Self  SYNTHROID 200 MCG tablet [id number] No Take 200 mcg by mouth daily before breakfast. [provider] Taking Active Self  topiramate (TOPAMAX) 200 MG tablet [id number] No TAKE 1 TABLET (200 MG TOTAL) BY MOUTH AT BEDTIME. Melinda Partridge, DO Taking Active Self  triamcinolone cream (KENALOG) 0.1 % [id number] No Apply 1 application topically 2 (two) times daily. Melinda Pier, MD More than a month Active Self  TRULICITY 8.92 JJ/9.4RD SOPN [id number] No Inject 1.5 mg into the skin once a  week. 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   Chronic bilateral low back pain without sciatica 04/22/2016   Liver lesion 12/24/2015   Controlled type 2 diabetes mellitus with complication, with long-term current use of insulin ([place]) 10/10/2015   Vision loss 08/25/2015   Decreased vision in both eyes 08/25/2015   Prolonged Q-T interval on ECG 08/19/2015   History of pulmonary embolism 08/10/2015   Mild persistent asthma 04/13/2015   Fibroid, uterine    Morbid obesity ([place]) 11/11/2014   Atypical chest pain 11/08/2014   Sinus tachycardia 11/08/2014   Migraine 10/14/2014   Menorrhagia 09/08/2014    Conditions to be addressed/monitored per PCP  order:   chronic healthcare management needs, DM, asthma, HTN, migraines, anxiety/depression, chronic pain, HLD.  Care Plan : General Plan of Care (Adult)  Updates made by Melinda Medicus, RN since 08/03/2021 12:00 AM     Problem: Health Promotion or Disease Self-Management (General Plan of Care)   Priority: Medium  Onset Date: 10/20/2020     Long-Range Goal: Self-Management Plan Developed   Start Date: 07/14/2020  Expected End Date: 10/03/2021  Recent Progress: On track  Priority: Medium  Note:    Current Barriers:  Chronic Disease Management support and education needs related to DM, asthma, glaucoma, chronic pain,HTN, migraines Patient with anxiety and depression-has appointment with Psychiatrist  scheduled every 3 months, therapist every 2 weeks 08/03/21:  Patient without specific complaint today, PT scheduled to start 08/06/21-has follow up appointments  scheduled. Nurse Case Manager Clinical Goal(s):  Over the next 90 days, patient will attend all scheduled medical appointments:  Over the next 30 days, patient will work with CM team pharmacist to review medications. Interventions:  Inter-disciplinary care team collaboration (see longitudinal plan of care) Evaluation of current treatment plan  and patient's adherence to plan as established by provider. Reviewed medications with patient. Collaborated with pharmacy regarding medications. Discussed plans with patient for ongoing care management follow up and provided patient with direct contact information for care management team Reviewed scheduled/upcoming provider appointments. Collaborated with SW for psychotherapy referral. SW referral for psychotherapy provider. Pharmacy referral for medication review. Self Care Activities: Over the next 90 days, patient will:  -Self administers medications as prescribed Attends all scheduled provider appointments Calls pharmacy for medication refills Calls provider office for new concerns or questions  Follow Up Plan: The Managed Medicaid care management team will reach out to the patient again over the next 30 days.  The patient has been provided with contact information for the Managed Medicaid care management team and has been advised to call with any health related questions or concerns.   Evidence-based guidance:  Review biopsychosocial determinants of health screens.  Determine level of modifiable health risk.  Assess level of patient activation, level of readiness, importance and confidence to make changes.  Evoke change talk using open-ended questions, pros and cons, as well as looking forward.  Identify areas where behavior change [name] lead to improved health.  Partner with patient to develop a robust self-management plan that includes lifestyle factors, such as weight loss, exercise and healthy nutrition, as well as goals specific to  disease risks.  Support patient and family/caregiver active participation in decision-making and self-management plan.  Implement additional goals and interventions based on identified risk factors to reduce health risk.  Facilitate advance care planning.  Review need for preventive screening based on age, sex, family history and health history.      Follow Up:  Patient agrees to Care Plan and Follow-up.  Plan: The Managed Medicaid care management team will reach out to the patient again over the next 30 days. and The  Patient has been provided with contact information for the Managed Medicaid care management team and has been advised to call with any health related questions or concerns.  Date/time of next scheduled RN care management/care coordination outreach:  08/31/21 at 230.

## 2021-08-06 ENCOUNTER — Other Ambulatory Visit: Payer: Self-pay

## 2021-08-06 ENCOUNTER — Ambulatory Visit: Payer: Medicaid Other | Attending: Specialist

## 2021-08-06 DIAGNOSIS — M4726 Other spondylosis with radiculopathy, lumbar region: Secondary | ICD-10-CM | POA: Insufficient documentation

## 2021-08-06 DIAGNOSIS — M4802 Spinal stenosis, cervical region: Secondary | ICD-10-CM | POA: Insufficient documentation

## 2021-08-06 DIAGNOSIS — M6281 Muscle weakness (generalized): Secondary | ICD-10-CM | POA: Insufficient documentation

## 2021-08-06 DIAGNOSIS — M25511 Pain in right shoulder: Secondary | ICD-10-CM | POA: Insufficient documentation

## 2021-08-06 DIAGNOSIS — R2689 Other abnormalities of gait and mobility: Secondary | ICD-10-CM | POA: Insufficient documentation

## 2021-08-06 DIAGNOSIS — M47812 Spondylosis without myelopathy or radiculopathy, cervical region: Secondary | ICD-10-CM | POA: Diagnosis not present

## 2021-08-06 DIAGNOSIS — R2681 Unsteadiness on feet: Secondary | ICD-10-CM | POA: Diagnosis present

## 2021-08-06 DIAGNOSIS — M5030 Other cervical disc degeneration, unspecified cervical region: Secondary | ICD-10-CM | POA: Insufficient documentation

## 2021-08-06 DIAGNOSIS — G8929 Other chronic pain: Secondary | ICD-10-CM | POA: Diagnosis present

## 2021-08-06 DIAGNOSIS — M778 Other enthesopathies, not elsewhere classified: Secondary | ICD-10-CM | POA: Insufficient documentation

## 2021-08-06 DIAGNOSIS — R262 Difficulty in walking, not elsewhere classified: Secondary | ICD-10-CM | POA: Diagnosis present

## 2021-08-06 NOTE — Addendum Note (Signed)
Addended by: Ruffin Pyo, Wentworth Edelen C on: 08/06/2021 02:00 PM   Modules accepted: Orders

## 2021-08-06 NOTE — Telephone Encounter (Signed)
Contacted the scheduling dept to get an order coder for CT. Unfortunately the schedule dept was unable to provide information. Per Vickii Chafe will need to contact the radiologist to get code at [phone]
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
## 2021-08-09 NOTE — Telephone Encounter (Signed)
Sent pt a MyChart message in regards to appt and did prior auth for CT and placed auth number in appt note

## 2021-08-13 DIAGNOSIS — F4325 Adjustment disorder with mixed disturbance of emotions and conduct: Secondary | ICD-10-CM | POA: Diagnosis not present

## 2021-08-14 ENCOUNTER — Ambulatory Visit: Payer: Medicaid Other

## 2021-08-14 ENCOUNTER — Other Ambulatory Visit: Payer: Self-pay

## 2021-08-14 DIAGNOSIS — M6281 Muscle weakness (generalized): Secondary | ICD-10-CM

## 2021-08-14 DIAGNOSIS — R262 Difficulty in walking, not elsewhere classified: Secondary | ICD-10-CM

## 2021-08-14 NOTE — Therapy (Signed)
Patient was arrived and PT went to get her from the lobby. Patient was very tearful and stated that had she known the date today she would have cancelled her appointment. She stated that the pollen from the flowers make her go into respiratory distress. She stated that she felt she needed to leave and reschedule this appointment.   PT will hold today's session and wait until after upcoming CAT scan on 08/16/2021 to work with patient again.  Ward Chatters, PT 08/14/21 12:29 PM

## 2021-08-15 ENCOUNTER — Encounter: Payer: Self-pay | Admitting: Specialist

## 2021-08-15 ENCOUNTER — Ambulatory Visit (INDEPENDENT_AMBULATORY_CARE_PROVIDER_SITE_OTHER): Payer: Medicaid Other | Admitting: Specialist

## 2021-08-15 VITALS — BP 149/90 | HR 58 | Ht 70.0 in | Wt 344.0 lb

## 2021-08-15 DIAGNOSIS — Z6841 Body Mass Index (BMI) 40.0 and over, adult: Secondary | ICD-10-CM

## 2021-08-15 DIAGNOSIS — M5030 Other cervical disc degeneration, unspecified cervical region: Secondary | ICD-10-CM

## 2021-08-15 DIAGNOSIS — M5136 Other intervertebral disc degeneration, lumbar region: Secondary | ICD-10-CM

## 2021-08-15 DIAGNOSIS — M778 Other enthesopathies, not elsewhere classified: Secondary | ICD-10-CM

## 2021-08-15 DIAGNOSIS — M4726 Other spondylosis with radiculopathy, lumbar region: Secondary | ICD-10-CM | POA: Diagnosis not present

## 2021-08-15 DIAGNOSIS — M47812 Spondylosis without myelopathy or radiculopathy, cervical region: Secondary | ICD-10-CM | POA: Diagnosis not present

## 2021-08-15 DIAGNOSIS — M4802 Spinal stenosis, cervical region: Secondary | ICD-10-CM | POA: Diagnosis not present

## 2021-08-15 DIAGNOSIS — M542 Cervicalgia: Secondary | ICD-10-CM | POA: Diagnosis not present

## 2021-08-15 DIAGNOSIS — M51369 Other intervertebral disc degeneration, lumbar region without mention of lumbar back pain or lower extremity pain: Secondary | ICD-10-CM

## 2021-08-15 NOTE — Progress Notes (Signed)
Office Visit Note   Patient: Melinda [name]           Date of Birth: [birth year]           MRN: [id number] Visit Date: 08/15/2021              Requested by: [name], MD [address] [place],  [place] [address] PCP: [name], MD   Assessment & Plan: Visit Diagnoses:  1. Spinal stenosis of cervical region   2. Other spondylosis with radiculopathy, lumbar region   3. Degenerative disc disease, cervical   4. Body mass index 50.0-59.9, adult (HCC)   5. Spondylosis of cervical spine   6. Degenerative disc disease, lumbar   7. Cervicalgia   8. Tendinitis of shoulder, unspecified laterality     Plan: Avoid overhead lifting and overhead use of the arms. Do not lift greater than 5 lbs. Adjust head rest in vehicle to prevent hyperextension if rear ended. Take extra precautions to avoid falling. Dr. Ernestina Patches plans for injection for diffuse spondylosis. Will see her back after the injection to determine if it will be of benefit. I am concerned if she is falling now, the risk she will have to her cervical  Spine if she falls after a major 3-4 level cervical fusion.    Follow-Up Instructions: Return in about 4 weeks (around 09/12/2021).   Orders:  No orders of the defined types were placed in this encounter.  No orders of the defined types were placed in this encounter.     Procedures: No procedures performed   Clinical Data: No additional findings.   Subjective: Chief Complaint  Patient presents with   Neck - Follow-up    HPI  Review of Systems   Objective: Vital Signs: BP (!) 149/90    Pulse (!) 58    Ht 5\' 10"  (1.778 m)    Wt (!) 344 lb (156 kg)    BMI 49.36 kg/m   Physical Exam  Ortho Exam  Specialty Comments:  No specialty comments available.  Imaging: No results found.   PMFS History: Patient Active Problem List   Diagnosis Date Noted   At high risk for falls 04/20/2020   Hyperlipidemia 02/16/2020   Adrenal mass, right ([place])  04/29/2019   Chronic cough 03/12/2018   Left shoulder pain 10/14/2016   Hypertension 10/14/2016   S/P thyroidectomy 06/27/2016   Chronic bilateral low back pain without sciatica 04/22/2016   Liver lesion 12/24/2015   Controlled type 2 diabetes mellitus with complication, with long-term current use of insulin ([place]) 10/10/2015   Vision loss 08/25/2015   Decreased vision in both eyes 08/25/2015   Prolonged Q-T interval on ECG 08/19/2015   History of pulmonary embolism 08/10/2015   Mild persistent asthma 04/13/2015   Fibroid, uterine    Morbid obesity ([place]) 11/11/2014   Atypical chest pain 11/08/2014   Sinus tachycardia 11/08/2014   Migraine 10/14/2014   Menorrhagia 09/08/2014   Past Medical History:  Diagnosis Date   ADHD (attention deficit hyperactivity disorder)    Arthritis    Asthma    Cancer ([place])    Thyroid   Chicken pox AGE 31   COPD (chronic obstructive pulmonary disease) ([place])    pt reported   Diabetes mellitus without complication (HCC)    Diverticulosis, sigmoid    GERD (gastroesophageal reflux disease)    Glaucoma    BOTH EYES, NO EYE DROPS   Glaucoma    History of blood transfusion 08/2014  2UNITS GIVEN AND IRON GIVEN   Hyperglycemia 09/09/2014   Hypertension    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 ([place]) AGE 32   HAD TO LEARN TO WALK AGAIN   Iron deficiency anemia due to chronic blood loss 09/09/2014   Low TSH level 09/08/2014   Lumbar herniated disc    Menorrhagia 09/08/2014   Migraine    CLUSTER AND MIGRAINES   Multiple thyroid nodules    Ovarian mass, right 09/09/2014   PE (pulmonary embolism) 2016   Peripheral neuropathy    FINGER TIPS AND TOES NUMB SOME   Preeclampsia 1983   WITH PREGNANCY   PTSD (post-traumatic stress disorder)    Scoliosis    Seizures ([place]) AGE 53   NONE SINCE, HAD CHICKEN POX THEN    Family History  Problem Relation Age of Onset   Diabetes Mother    Breast cancer Mother    CAD Mother     Hypertension Mother    Alcohol abuse Father    Hypertension Father    Breast cancer Maternal Aunt    Breast cancer Maternal Aunt     Past Surgical History:  Procedure Laterality Date   ANGIOGRAM TO LEG  08-13-15   RIGHT   CHOLECYSTECTOMY     IR GENERIC HISTORICAL  04/04/2016   IR RADIOLOGIST EVAL & MGMT 04/04/2016 [name], MD [facility] INTERV RAD   RADIOLOGY WITH ANESTHESIA N/A 07/17/2021   Procedure: MRI LUMBAR SPINE WITHOUT CONTRAST; MRI CERVICAL SPINE WITHOUT CONTRAST WITH ANESTHESIA;  Surgeon: Radiologist, Medication, MD;  Location: [facility];  Service: Radiology;  Laterality: N/A;   THYROIDECTOMY N/A 11/17/2015   Procedure: TOTAL THYROIDECTOMY;  Surgeon: Armandina Gemma, MD;  Location: [facility];  Service: General;  Laterality: N/A;   UTERINE ARTERY EMBOLIZATION Bilateral 08/13/2015   Social History   Occupational History   Occupation: disabled  Tobacco Use   Smoking status: Former    Packs/day: 0.50    Years: 40.00    Pack years: 20.00    Types: Cigarettes    Quit date: 11/03/2014    Years since quitting: 6.7   Smokeless tobacco: Never  Vaping Use   Vaping Use: Never used  Substance and Sexual Activity   Alcohol use: No    Alcohol/week: 0.0 standard drinks   Drug use: No   Sexual activity: Never    Birth control/protection: None

## 2021-08-15 NOTE — Patient Instructions (Signed)
Plan: Avoid overhead lifting and overhead use of the arms. Do not lift greater than 5 lbs. Adjust head rest in vehicle to prevent hyperextension if rear ended. Take extra precautions to avoid falling. Dr. Ernestina Patches plans for injection for diffuse spondylosis. Will see her back after the injection to determine if it will be of benefit. I am concerned if she is falling now, the risk she will have to her cervical  Spine if she falls after a major 3-4 level cervical fusion.

## 2021-08-16 ENCOUNTER — Ambulatory Visit (HOSPITAL_COMMUNITY)
Admission: RE | Admit: 2021-08-16 | Discharge: 2021-08-16 | Disposition: A | Discharge status: Home / Self Care | Payer: Medicaid Other | Source: Ambulatory Visit | Attending: Internal Medicine | Admitting: Internal Medicine

## 2021-08-16 ENCOUNTER — Other Ambulatory Visit: Payer: Self-pay

## 2021-08-16 DIAGNOSIS — R160 Hepatomegaly, not elsewhere classified: Secondary | ICD-10-CM | POA: Insufficient documentation

## 2021-08-16 DIAGNOSIS — E278 Other specified disorders of adrenal gland: Secondary | ICD-10-CM | POA: Diagnosis not present

## 2021-08-16 DIAGNOSIS — I700 Atherosclerosis of aorta: Secondary | ICD-10-CM | POA: Insufficient documentation

## 2021-08-16 DIAGNOSIS — K760 Fatty (change of) liver, not elsewhere classified: Secondary | ICD-10-CM | POA: Insufficient documentation

## 2021-08-16 MED ORDER — IOHEXOL 300 MG/ML  SOLN
100.0000 mL | Freq: Once | INTRAMUSCULAR | Status: AC | PRN
  Administered 2021-08-16: 100 mL via INTRAVENOUS

## 2021-08-17 ENCOUNTER — Ambulatory Visit: Payer: Medicaid Other | Admitting: Podiatry

## 2021-08-19 ENCOUNTER — Telehealth: Payer: Self-pay | Admitting: Internal Medicine

## 2021-08-19 DIAGNOSIS — D1779 Benign lipomatous neoplasm of other sites: Secondary | ICD-10-CM

## 2021-08-19 NOTE — Telephone Encounter (Signed)
PC placed to pt this evening to go over results of recent Adrenal CT.  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 In 2019 it was 4.7 in greatest diameter.  Now it is 5.8 in greatest diameter with evidence of prior internal hemorrhage.  Pt is on Eliquis.  She reports she sometimes gets cramping around her waist as if she is about to come on a menstrual cycle.  CT of abdomen/pelvis done 05/2021 revealed nl uterus and adnexa. 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  I told pt that 5.8 cm is getting pretty close so we should probably refer to a surgeon for opinion of whether this needs to be removed now or continue to observe.  Pt agreeable to referral. Incidental finding of aortic atherosclerosis.  Pt on Lipitor. Incidental finding of moderate stools in colon.  Pt states she is moving her bowels regularly.Melinda [name]

## 2021-08-20 ENCOUNTER — Ambulatory Visit: Payer: Self-pay

## 2021-08-20 ENCOUNTER — Ambulatory Visit (INDEPENDENT_AMBULATORY_CARE_PROVIDER_SITE_OTHER): Payer: Medicaid Other | Admitting: Physical Medicine and Rehabilitation

## 2021-08-20 ENCOUNTER — Other Ambulatory Visit: Payer: Self-pay

## 2021-08-20 ENCOUNTER — Encounter: Payer: Self-pay | Admitting: Physical Medicine and Rehabilitation

## 2021-08-20 VITALS — BP 113/71 | HR 95

## 2021-08-20 DIAGNOSIS — M5416 Radiculopathy, lumbar region: Secondary | ICD-10-CM

## 2021-08-20 MED ORDER — METHYLPREDNISOLONE ACETATE 80 MG/ML IJ SUSP
80.0000 mg | Freq: Once | INTRAMUSCULAR | Status: AC
  Administered 2021-08-20: 80 mg

## 2021-08-20 NOTE — Progress Notes (Signed)
Melinda [name] - 56 y.o. female MRN [id number]  Date of birth: September 01, 1965  Office Visit Note: Visit Date: 08/20/2021 PCP: [name], MD Referred by: [name], MD  Subjective: Chief Complaint  Patient presents with   Lower Back - Pain   Right Leg - Pain   Left Leg - Pain   HPI:  Melinda [name] is a 56 y.o. female who comes in today at the request of Dr. Basil Dess for planned Right L5-S1 Lumbar Interlaminar epidural steroid injection with fluoroscopic guidance.  The patient has failed conservative care including home exercise, medications, time and activity modification.  This injection will be diagnostic and hopefully therapeutic.  Please see requesting physician notes for further details and justification. MRI reviewed with images and spine model.  MRI reviewed in the note below.   ROS Otherwise per HPI.  Assessment & Plan: Visit Diagnoses:    ICD-10-CM   1. Lumbar radiculopathy  M54.16 XR C-ARM NO REPORT    Epidural Steroid injection    methylPREDNISolone acetate (DEPO-MEDROL) injection 80 mg      Plan: No additional findings.   Meds & Orders:  Meds ordered this encounter  Medications   methylPREDNISolone acetate (DEPO-MEDROL) injection 80 mg    Orders Placed This Encounter  Procedures   XR C-ARM NO REPORT   Epidural Steroid injection    Follow-up: Return for Basil Dess, MD.   Procedures: No procedures performed  Lumbar Epidural Steroid Injection - Interlaminar Approach with Fluoroscopic Guidance  Patient: Melinda [name]      Date of Birth: 03/02/1966 MRN: [id number] PCP: [name], MD      Visit Date: 08/20/2021   Universal Protocol:     Consent Given By: the patient  Position: PRONE  Additional Comments: Vital signs were monitored before and after the procedure. Patient was prepped and draped in the usual sterile fashion. The correct patient, procedure, and site was verified.   Injection Procedure Details:   Procedure  diagnoses: Lumbar radiculopathy [M54.16]   Meds Administered:  Meds ordered this encounter  Medications   methylPREDNISolone acetate (DEPO-MEDROL) injection 80 mg     Laterality: Right  Location/Site:  L5-S1  Needle: 4.5 in., 20 ga. Tuohy  Needle Placement: Paramedian epidural  Findings:   -Comments: Excellent flow of contrast into the epidural space.  Procedure Details: Using a paramedian approach from the side mentioned above, the region overlying the inferior lamina was localized under fluoroscopic visualization and the soft tissues overlying this structure were infiltrated with 4 ml. of 1% Lidocaine without Epinephrine. The Tuohy needle was inserted into the epidural space using a paramedian approach.   The epidural space was localized using loss of resistance along with counter oblique bi-planar fluoroscopic views.  After negative aspirate for air, blood, and CSF, a 2 ml. volume of Isovue-250 was injected into the epidural space and the flow of contrast was observed. Radiographs were obtained for documentation purposes.    The injectate was administered into the level noted above.   Additional Comments:  No complications occurred Dressing: 2 x 2 sterile gauze and Band-Aid    Post-procedure details: Patient was observed during the procedure. Post-procedure instructions were reviewed.  Patient left the clinic in stable condition.    Clinical History: MRI LUMBAR SPINE WITHOUT CONTRAST   TECHNIQUE: Multiplanar, multisequence MR imaging of the lumbar spine was performed. No intravenous contrast was administered.   COMPARISON:  Lumbar spine MRI 05/11/2021. Lumbar spine radiographs 05/11/2021. CT abdomen/pelvis 06/18/2021.  FINDINGS: Segmentation: 5 lumbar vertebrae are assumed and the caudal most well-formed intervertebral disc space is designated L5-S1.   Alignment: Straightening of the expected lumbar lordosis. Trace L2-L3 grade 1 retrolisthesis.   Vertebrae:  Vertebral body height is maintained. No significant marrow edema or focal suspicious osseous lesion.   Conus medullaris and cauda equina: Conus extends to the L2 level. No signal abnormality within the visualized distal spinal cord.   Paraspinal and other soft tissues: No abnormality identified within included portions of the abdomen/retroperitoneum. Paraspinal soft tissues unremarkable.   Disc levels:   Unless otherwise stated, the level by level findings below have not significantly changed from the prior MRI of 05/11/2021.   Advanced disc degeneration at L5-S1. No more than mild disc degeneration at the remaining levels.   T11-T12: Imaged sagittally. Facet arthrosis. No significant disc herniation or stenosis.   T12-L1: Imaged sagittally. No significant disc herniation or stenosis.   L1-L2: Small disc bulge. Superimposed small right center disc protrusion. No significant spinal canal stenosis or neural foraminal narrowing.   L2-L3: Trace grade 1 retrolisthesis. Shallow broad-based left foraminal/extraforaminal disc protrusion at site of posterior annular fissure. No significant spinal canal stenosis. Mild left inferior neural foraminal narrowing. The disc protrusion may contact the undersurface of the exiting left L2 nerve root.   L3-L4: Slight disc bulge. Right foraminal/extraforaminal zone annular fissure, not definitely present on the prior MRI. No significant spinal canal or foraminal stenosis.   L4-L5: Small disc bulge. Mild facet arthrosis/ligamentum flavum hypertrophy. Trace right facet joint effusion. Mild prominence of the epidural fat. No significant degenerative spinal canal or foraminal stenosis.   L5-S1: Advanced disc degeneration with circumferential endplate spurring/osteophytic ridging. Disc osteophyte ridge is most focally prominent within the central zone. Prominence of the epidural fat. As before, disc osteophyte ridge contributes to mild  bilateral subarticular stenosis, contacting the bilateral descending S1 nerve roots. It also contributes to mild central canal stenosis. Minimal bilateral neural foraminal narrowing.   IMPRESSION: At L3-L4, there is a right foraminal/extraforaminal zone annular fissure which was not definitively present on the prior MRI of 05/22/2021.   Lumbar spondylosis has otherwise not significantly changed from this prior exam. Findings are most notably as follows.   At L5-S1, there is advanced disc degeneration with circumferential endplate spurring/osteophytic ridging. Disc osteophyte ridge is most focally prominent within the central zone. Prominence of the epidural fat. As before, disc osteophyte ridge contributes to mild bilateral subarticular stenosis, contacting the bilateral descending S1 nerve roots. It also contributes to mild central canal stenosis. Minimal bilateral neural foraminal narrowing.   At L2-L3, a shallow broad-based left foraminal/extraforaminal disc protrusion contributes to mild left inferior neural foraminal narrowing, and may contact the undersurface of the exiting left L2 nerve root.   No significant spinal canal or foraminal stenosis at the remaining levels.     Electronically Signed   By: Kellie Simmering D.O.   On: 07/17/2021 12:29     Objective:  VS:  HT:     WT:    BMI:      BP:113/71   HR:95bpm   TEMP: ( )   RESP:  Physical Exam Vitals and nursing note reviewed.  Constitutional:      General: She is not in acute distress.    Appearance: Normal appearance. She is obese. She is not ill-appearing.  HENT:     Head: Normocephalic and atraumatic.     Right Ear: External ear normal.     Left Ear: External ear  normal.  Eyes:     Extraocular Movements: Extraocular movements intact.  Cardiovascular:     Rate and Rhythm: Normal rate.     Pulses: Normal pulses.  Pulmonary:     Effort: Pulmonary effort is normal. No respiratory distress.  Abdominal:      General: There is no distension.     Palpations: Abdomen is soft.  Musculoskeletal:        General: Tenderness present.     Cervical back: Neck supple.     Right lower leg: No edema.     Left lower leg: No edema.     Comments: Patient has good distal strength with no pain over the greater trochanters.  No clonus or focal weakness.  Skin:    Findings: No erythema, lesion or rash.  Neurological:     General: No focal deficit present.     Mental Status: She is alert and oriented to person, place, and time.     Sensory: No sensory deficit.     Motor: No weakness or abnormal muscle tone.     Coordination: Coordination normal.  Psychiatric:        Mood and Affect: Mood normal.        Behavior: Behavior normal.     Imaging: No results found.

## 2021-08-20 NOTE — Patient Instructions (Signed)

## 2021-08-20 NOTE — Progress Notes (Signed)
Pt state lower back pain that travels down both legs. Pt state walking, standing and laying down makes the pain worse. Pt state she takes over the counter pain meds to help ease her pain.  Numeric Pain Rating Scale and Functional Assessment Average Pain 4   In the last MONTH (on 0-10 scale) has pain interfered with the following?  1. General activity like being  able to carry out your everyday physical activities such as walking, climbing stairs, carrying groceries, or moving a chair?  Rating(10)   +Driver, +BT pt has stopped her BT on Thursday night., -Dye Allergies.

## 2021-08-20 NOTE — Procedures (Signed)
Lumbar Epidural Steroid Injection - Interlaminar Approach with Fluoroscopic Guidance  Patient: Melinda [name]      Date of Birth: 07-31-1965 MRN: [id number] PCP: [name], MD      Visit Date: 08/20/2021   Universal Protocol:     Consent Given By: the patient  Position: PRONE  Additional Comments: Vital signs were monitored before and after the procedure. Patient was prepped and draped in the usual sterile fashion. The correct patient, procedure, and site was verified.   Injection Procedure Details:   Procedure diagnoses: Lumbar radiculopathy [M54.16]   Meds Administered:  Meds ordered this encounter  Medications   methylPREDNISolone acetate (DEPO-MEDROL) injection 80 mg     Laterality: Right  Location/Site:  L5-S1  Needle: 4.5 in., 20 ga. Tuohy  Needle Placement: Paramedian epidural  Findings:   -Comments: Excellent flow of contrast into the epidural space.  Procedure Details: Using a paramedian approach from the side mentioned above, the region overlying the inferior lamina was localized under fluoroscopic visualization and the soft tissues overlying this structure were infiltrated with 4 ml. of 1% Lidocaine without Epinephrine. The Tuohy needle was inserted into the epidural space using a paramedian approach.   The epidural space was localized using loss of resistance along with counter oblique bi-planar fluoroscopic views.  After negative aspirate for air, blood, and CSF, a 2 ml. volume of Isovue-250 was injected into the epidural space and the flow of contrast was observed. Radiographs were obtained for documentation purposes.    The injectate was administered into the level noted above.   Additional Comments:  No complications occurred Dressing: 2 x 2 sterile gauze and Band-Aid    Post-procedure details: Patient was observed during the procedure. Post-procedure instructions were reviewed.  Patient left the clinic in stable condition.

## 2021-08-21 ENCOUNTER — Ambulatory Visit: Payer: Medicaid Other

## 2021-08-21 ENCOUNTER — Encounter: Payer: Self-pay | Admitting: Internal Medicine

## 2021-08-21 ENCOUNTER — Ambulatory Visit: Payer: Medicaid Other | Attending: Internal Medicine | Admitting: Internal Medicine

## 2021-08-21 DIAGNOSIS — K760 Fatty (change of) liver, not elsewhere classified: Secondary | ICD-10-CM | POA: Diagnosis not present

## 2021-08-21 DIAGNOSIS — I10 Essential (primary) hypertension: Secondary | ICD-10-CM

## 2021-08-21 DIAGNOSIS — E65 Localized adiposity: Secondary | ICD-10-CM

## 2021-08-21 DIAGNOSIS — J4540 Moderate persistent asthma, uncomplicated: Secondary | ICD-10-CM

## 2021-08-21 DIAGNOSIS — I700 Atherosclerosis of aorta: Secondary | ICD-10-CM | POA: Insufficient documentation

## 2021-08-21 DIAGNOSIS — E1169 Type 2 diabetes mellitus with other specified complication: Secondary | ICD-10-CM

## 2021-08-21 DIAGNOSIS — D1779 Benign lipomatous neoplasm of other sites: Secondary | ICD-10-CM | POA: Diagnosis not present

## 2021-08-21 MED ORDER — BUDESONIDE-FORMOTEROL FUMARATE 160-4.5 MCG/ACT IN AERO: 2.0000 | INHALATION_SPRAY | Freq: Two times a day (BID) | RESPIRATORY_TRACT | 6 refills | Status: AC

## 2021-08-21 NOTE — Progress Notes (Signed)
Patient ID: Melinda [name], female    DOB: 1966-05-04  MRN: [id number]  CC: Diabetes and Hypertension   Subjective: Melinda [name] is a 56 y.o. female who presents for chronic ds management Her concerns today include:  Pt with hx of DM (followed by Dr. 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 Buddy Duty.  Has upcoming appointment next month.  Checks blood sugars 3 times a day.  Gives range of 120-130's.  She is on Trulicity. Doing good with eating habits.  Mainly chicken and fish for her meats.  Some pork but not too much Has appt with Dr. Posey Pronto, podiatrist tomorrow.  HTN: Reports compliance with HCTZ, metoprolol,.  She limits salt in the foods.  Chronic swelling in the left lower leg.  HL: Taking and tolerating lipitor  Pannus: On last visit she complained of the left middle to lateral side of her lower abdomen being larger than the right side.  She had a feeling of it being waited down.  My assessment was that she likely has a pannus.  Ultrasound of the abdomen revealed mild subcutaneous edema and probable collateral vein but no fluid collection.  Subsequent CAT scan of the abdomen revealed skin thickening and subcutaneous stranding/edema in the left lower pannus worrisome for infection.  No evidence for fluid collection or soft tissue gas.  Patient was treated with clindamycin.  She feels things did not improve with the antibiotic.  She has not had any fever.  She gets soreness in the left lower abdomen that goes and comes.  She has not noted any erythema of the skin.  She notes large stretch marks across the lower abdomen left side greater than right.    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  Changes within the lesion that questionably represents an intralesional hematoma with the regions of dystrophic calcification likely reflecting prior internal hemorrhage. The incidental finding on that CAT scan also showed aortic atherosclerosis and hepatomegaly with hepatic steatosis.  Most recent LFTs done 05/11/2021 revealed normal LFTs.  Asthma:  using inhaler a bit more lately.  Using Neb a few times a wk Out Symbicort Patient Active Problem List   Diagnosis Date Noted   At high risk for falls 04/20/2020   Hyperlipidemia 02/16/2020   Adrenal mass, right ([place]) 04/29/2019   Chronic cough 03/12/2018   Left shoulder pain 10/14/2016   Hypertension 10/14/2016   S/P thyroidectomy 06/27/2016   Chronic bilateral low back pain without sciatica 04/22/2016   Liver lesion 12/24/2015   Controlled type 2 diabetes mellitus with complication, with long-term current use of insulin ([place]) 10/10/2015   Vision loss 08/25/2015   Decreased vision in both eyes 08/25/2015   Prolonged Q-T interval on ECG 08/19/2015   History of pulmonary embolism 08/10/2015   Mild persistent asthma 04/13/2015   Fibroid, uterine    Morbid obesity ([place]) 11/11/2014   Atypical chest pain 11/08/2014   Sinus tachycardia 11/08/2014   Migraine 10/14/2014   Menorrhagia 09/08/2014     Current Outpatient Medications on File Prior to Visit  Medication Sig Dispense Refill   acetaminophen (TYLENOL) 500 MG tablet Take 1 tablet (500 mg total) by mouth every  6 (six) hours as needed. 30 tablet 0   albuterol (PROVENTIL) (2.5 MG/3ML) 0.083% nebulizer solution Take 3 mLs (2.5 mg total) by nebulization every 6 (six) hours as needed for wheezing or shortness of breath. 3 mL 3   albuterol (VENTOLIN HFA) 108 (90 Base) MCG/ACT inhaler Inhale 2 puffs into the lungs every 6 (six) hours as needed for wheezing or shortness of breath. 8 g 2   atorvastatin (LIPITOR) 20 MG tablet TAKE ONE  TABLET BY MOUTH ONCE DAILY AT BEDTIME 30 tablet 2   budesonide-formoterol (SYMBICORT) 160-4.5 MCG/ACT inhaler Inhale 2 puffs into the lungs 2 (two) times daily. 2 each 6   buPROPion (WELLBUTRIN XL) 300 MG 24 hr tablet Take 300 mg by mouth daily.   2   busPIRone (BUSPAR) 10 MG tablet Take 10 mg by mouth 2 (two) times daily.     Cholecalciferol (VITAMIN D3) 10 MCG (400 UNIT) CAPS TAKE 1 CAPSULE (400 UNITS TOTAL) BY MOUTH DAILY (AM) 100 capsule 0   citalopram (CELEXA) 40 MG tablet Take 40 mg by mouth daily.   2   cyclobenzaprine (FLEXERIL) 10 MG tablet TAKE 1 TABLET (10 MG TOTAL) BY MOUTH 3 (THREE) TIMES DAILY AS NEEDED FOR MUSCLE SPASMS. 30 tablet 1   doxepin (SINEQUAN) 50 MG capsule Take 150 mg by mouth at bedtime.     ELIQUIS 5 MG TABS tablet TAKE ONE TABLET BY MOUTH TWICE DAILY MORNING AND EVENING 60 tablet 2   Erenumab-aooe (AIMOVIG) 140 MG/ML SOAJ Inject 140 mg into the skin every 28 (twenty-eight) days. 1.12 mL 5   gabapentin (NEURONTIN) 300 MG capsule TAKE ONE CAPSULE BY MOUTH THREE TIMES DAILY (AM+NOON+BEDTIME) 90 capsule 1   hydrochlorothiazide (MICROZIDE) 12.5 MG capsule TAKE ONE CAPSULE BY MOUTH ONCE DAILY(AM) 30 capsule 2   metoprolol tartrate (LOPRESSOR) 50 MG tablet TAKE ONE TABLET BY MOUTH TWICE DAILY (AM+BEDTIME) 60 tablet 2   montelukast (SINGULAIR) 10 MG tablet TAKE 1 TABLET BY MOUTH EVERY EVENING AT BEDTIME 30 tablet 2   prazosin (MINIPRESS) 2 MG capsule Take 6 mg by mouth at bedtime.     sodium chloride (OCEAN) 0.65 % SOLN nasal spray Place 1 spray into both nostrils as needed for congestion. 30 mL 0   SYNTHROID 200 MCG tablet Take 200 mcg by mouth daily before breakfast.     topiramate (TOPAMAX) 200 MG tablet TAKE 1 TABLET (200 MG TOTAL) BY MOUTH AT BEDTIME. 90 tablet 1   triamcinolone cream (KENALOG) 0.1 % Apply 1 application topically 2 (two) times daily. 30 g 0   TRULICITY 2.67 TI/4.5YK SOPN Inject 1.5 mg into the skin once a week.     No current facility-administered  medications on file prior to visit.    Allergies  Allergen Reactions   Ceftriaxone Anaphylaxis    ROCEPHIN   Metformin Anaphylaxis    ALL   Penicillins Shortness Of Breath    Has patient had a PCN reaction causing immediate rash, facial/tongue/throat swelling, SOB or lightheadedness with hypotension: Yes Has patient had a PCN reaction causing severe rash involving mucus membranes or skin necrosis: No Has patient had a PCN reaction that required hospitalization No Has patient had a PCN reaction occurring within the last 10 years: No If all of the above answers are "NO", then may proceed with Cephalosporin use.    Shellfish Allergy Anaphylaxis   Flonase [Fluticasone Propionate]     Makes migraines worse   Gold-Containing Drug Products     HANDS ITCH   Nickel  HANDS SWELL   Citrus Rash    Social History   Socioeconomic History   Marital status: Single    Spouse name: Not on file   Number of children: 1   Years of education: 65   Highest education level: Some college, no degree  Occupational History   Occupation: disabled  Tobacco Use   Smoking status: Former    Packs/day: 0.50    Years: 40.00    Pack years: 20.00    Types: Cigarettes    Quit date: 11/03/2014    Years since quitting: 6.8   Smokeless tobacco: Never  Vaping Use   Vaping Use: Never used  Substance and Sexual Activity   Alcohol use: No    Alcohol/week: 0.0 standard drinks   Drug use: No   Sexual activity: Never    Birth control/protection: None  Other Topics Concern   Not on file  Social History Narrative   Patient is right-handed. She lives in a one level handicap accessible apartment. She avoids caffeine. She uses stationary stationary pedals to use for exercise.   Social Determinants of Health   Financial Resource Strain: Low Risk    Difficulty of Paying Living Expenses: Not very hard  Food Insecurity: No Food Insecurity   Worried About Charity fundraiser in the Last Year: Never true   Ran  Out of Food in the Last Year: Never true  Transportation Needs: Unmet Transportation Needs   Lack of Transportation (Medical): Yes   Lack of Transportation (Non-Medical): No  Physical Activity: Inactive   Days of Exercise per Week: 0 days   Minutes of Exercise per Session: 0 min  Stress: Stress Concern Present   Feeling of Stress : To some extent  Social Connections: Socially Isolated   Frequency of Communication with Friends and Family: More than three times a week   Frequency of Social Gatherings with Friends and Family: More than three times a week   Attends Religious Services: Never   Marine scientist or Organizations: No   Attends Music therapist: Never   Marital Status: Never married  Human resources officer Violence: Not At Risk   Fear of Current or Ex-Partner: No   Emotionally Abused: No   Physically Abused: No   Sexually Abused: No    Family History  Problem Relation Age of Onset   Diabetes Mother    Breast cancer Mother    CAD Mother    Hypertension Mother    Alcohol abuse Father    Hypertension Father    Breast cancer Maternal Aunt    Breast cancer Maternal Aunt     Past Surgical History:  Procedure Laterality Date   ANGIOGRAM TO LEG  08-13-15   RIGHT   CHOLECYSTECTOMY     IR GENERIC HISTORICAL  04/04/2016   IR RADIOLOGIST EVAL & MGMT 04/04/2016 [name], MD [facility] INTERV RAD   RADIOLOGY WITH ANESTHESIA N/A 07/17/2021   Procedure: MRI LUMBAR SPINE WITHOUT CONTRAST; MRI CERVICAL SPINE WITHOUT CONTRAST WITH ANESTHESIA;  Surgeon: Radiologist, Medication, MD;  Location: [facility] [place];  Service: Radiology;  Laterality: N/A;   THYROIDECTOMY N/A 11/17/2015   Procedure: TOTAL THYROIDECTOMY;  Surgeon: Armandina Gemma, MD;  Location: [facility];  Service: General;  Laterality: N/A;   UTERINE ARTERY EMBOLIZATION Bilateral 08/13/2015    ROS: Review of Systems Negative except as stated above  PHYSICAL EXAM: BP 120/77    Pulse 93    Resp 16    Wt (!) 351 lb (159.2 kg)  SpO2 94%    BMI 50.36 kg/m   Wt Readings from Last 3 Encounters:  08/21/21 (!) 351 lb (159.2 kg)  08/15/21 (!) 344 lb (156 kg)  07/20/21 (!) 344 lb (156 kg)    Physical Exam  General appearance - alert, well appearing, morbidly obese middle age AAF and in no distress Mental status - normal mood, behavior, speech, dress, motor activity, and thought processes Neck - supple, no significant adenopathy Chest - clear to auscultation, no wheezes, rales or rhonchi, symmetric air entry Heart - normal rate, regular rhythm, normal S1, S2, no murmurs, rubs, clicks or gallops Abdomen: Obese.  Stretch marks across the lower abdomen.  Left lower abdomen larger than right.  No tenderness, guarding or rebound. Extremities - peripheral pulses normal.  Left lower leg slightly larger than right.  No pitting edema. Diabetic Foot Exam - Simple   Simple Foot Form Visual Inspection See comments: Yes Sensation Testing See comments: Yes Pulse Check Posterior Tibialis and Dorsalis pulse intact bilaterally: Yes Comments Patient is flat-footed bilaterally.  She has decreased sensation on the plantar surface of both feet.       CMP Latest Ref Rng & Units 07/17/2021 05/11/2021 05/11/2021  Glucose 70 - 99 mg/dL 137(H) 120(H) 123(H)  BUN 6 - 20 mg/dL 9 12 10   Creatinine 0.44 - 1.00 mg/dL 1.19(H) 1.10(H) 1.06(H)  Sodium 135 - 145 mmol/L 141 143 142  Potassium 3.5 - 5.1 mmol/L 3.2(L) 3.5 3.5  Chloride 98 - 111 mmol/L 107 110 107  CO2 22 - 32 mmol/L 24 - 26  Calcium 8.9 - 10.3 mg/dL 8.8(L) - 8.7(L)  Total Protein 6.5 - 8.1 g/dL - - 7.0  Total Bilirubin 0.3 - 1.2 mg/dL - - 0.8  Alkaline Phos 38 - 126 U/L - - 91  AST 15 - 41 U/L - - 19  ALT 0 - 44 U/L - - 27   Lipid Panel     Component Value Date/Time   CHOL 130 04/20/2020 1133   TRIG 95 04/20/2020 1133   HDL 44 04/20/2020 1133   CHOLHDL 3.0 04/20/2020 1133   CHOLHDL 2.7 09/08/2014 0147   VLDL 18 09/08/2014 0147   LDLCALC 68 04/20/2020 1133     CBC    Component Value Date/Time   WBC 10.5 05/11/2021 1434   RBC 4.78 05/11/2021 1434   HGB 15.0 05/11/2021 1442   HGB 15.7 08/25/2020 1116   HCT 44.0 05/11/2021 1442   HCT 46.6 08/25/2020 1116   PLT 237 05/11/2021 1434   PLT 289 04/29/2019 1106   MCV 93.1 05/11/2021 1434   MCV 89 08/25/2020 1116   MCH 30.5 05/11/2021 1434   MCHC 32.8 05/11/2021 1434   RDW 14.1 05/11/2021 1434   RDW 13.1 08/25/2020 1116   LYMPHSABS 2.3 05/11/2021 1434   LYMPHSABS 1.8 08/25/2020 1116   MONOABS 0.6 05/11/2021 1434   EOSABS 0.1 05/11/2021 1434   EOSABS 0.1 08/25/2020 1116   BASOSABS 0.0 05/11/2021 1434   BASOSABS 0.0 08/25/2020 1116    ASSESSMENT AND PLAN:  1. Type 2 diabetes mellitus with morbid obesity (HCC) Blood sugars are within target range.  Continue Trulicity.  Encouraged her to continue healthy eating habits.  Keep follow-up appointment with her endocrinologist next month.  2. Essential hypertension At goal.  Continue current medications and low-salt diet.  3. Myelolipoma of right adrenal gland Up-to-date recommended consideration of resection once this type of lesion gets 6 cm or more in size or patient has symptoms.  Currently it is 5.8 cm in greatest diameter with radiologic findings suggestive of internal hemorrhage.  I have referred her to a general surgeon for evaluation of whether this can continue to be observable whether it should be resected.  4. Abdominal pannus Does not appear to be infected or inflammed at this time. Recommend that she has a Psychologist, sport and exercise take a look at this when he sees her for the adrenal gland lesion.  5. Aortic atherosclerosis ([place]) Already on statin therapy.  6. Fatty liver Stressed the importance of weight loss.  7.  Moderate persistent asthma -RF Symbicort.  Patient was given the opportunity to ask questions.  Patient verbalized understanding of the plan and was able to repeat key elements of the plan.   No orders of the defined types  were placed in this encounter.    Requested Prescriptions    No prescriptions requested or ordered in this encounter    Return in about 4 months (around 12/19/2021).  [name], MD, FACP

## 2021-08-21 NOTE — Therapy (Incomplete)
OUTPATIENT PHYSICAL THERAPY TREATMENT NOTE   Patient Name: Melinda [name] MRN: [id number] DOB:December 15, 1965, 56 y.o., female Today's Date: 08/21/2021  PCP: [name], MD REFERRING PROVIDER: [name], MD    Past Medical History:  Diagnosis Date   ADHD (attention deficit hyperactivity disorder)    Arthritis    Asthma    Cancer ([place])    Thyroid   Chicken pox AGE 44   COPD (chronic obstructive pulmonary disease) ([place])    pt reported   Diabetes mellitus without complication ([place])    Diverticulosis, sigmoid    GERD (gastroesophageal reflux disease)    Glaucoma    BOTH EYES, NO EYE DROPS   Glaucoma    History of blood transfusion 08/2014    2UNITS GIVEN AND IRON GIVEN   Hyperglycemia 09/09/2014   Hypertension    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 ([place]) AGE 9   HAD TO LEARN TO WALK AGAIN   Iron deficiency anemia due to chronic blood loss 09/09/2014   Low TSH level 09/08/2014   Lumbar herniated disc    Menorrhagia 09/08/2014   Migraine    CLUSTER AND MIGRAINES   Multiple thyroid nodules    Ovarian mass, right 09/09/2014   PE (pulmonary embolism) 2016   Peripheral neuropathy    FINGER TIPS AND TOES NUMB SOME   Preeclampsia 1983   WITH PREGNANCY   PTSD (post-traumatic stress disorder)    Scoliosis    Seizures ([place]) AGE 44   NONE SINCE, HAD CHICKEN POX THEN   Past Surgical History:  Procedure Laterality Date   ANGIOGRAM TO LEG  08-13-15   RIGHT   CHOLECYSTECTOMY     IR GENERIC HISTORICAL  04/04/2016   IR RADIOLOGIST EVAL & MGMT 04/04/2016 [name], MD [facility] INTERV RAD   RADIOLOGY WITH ANESTHESIA N/A 07/17/2021   Procedure: MRI LUMBAR SPINE WITHOUT CONTRAST; MRI CERVICAL SPINE WITHOUT CONTRAST WITH ANESTHESIA;  Surgeon: Radiologist, Medication, MD;  Location: [place];  Service: Radiology;  Laterality: N/A;   THYROIDECTOMY N/A 11/17/2015   Procedure: TOTAL THYROIDECTOMY;  Surgeon: Armandina Gemma, MD;  [facility];  Service:  General;  Laterality: N/A;   UTERINE ARTERY EMBOLIZATION Bilateral 08/13/2015   Patient Active Problem List   Diagnosis Date Noted   At high risk for falls 04/20/2020   Hyperlipidemia 02/16/2020   Adrenal mass, right ([place]) 04/29/2019   Chronic cough 03/12/2018   Left shoulder pain 10/14/2016   Hypertension 10/14/2016   S/P thyroidectomy 06/27/2016   Chronic bilateral low back pain without sciatica 04/22/2016   Liver lesion 12/24/2015   Controlled type 2 diabetes mellitus with complication, with long-term current use of insulin ([place]) 10/10/2015   Vision loss 08/25/2015   Decreased vision in both eyes 08/25/2015   Prolonged Q-T interval on ECG 08/19/2015   History of pulmonary embolism 08/10/2015   Mild persistent asthma 04/13/2015   Fibroid, uterine    Morbid obesity ([place]) 11/11/2014   Atypical chest pain 11/08/2014   Sinus tachycardia 11/08/2014   Migraine 10/14/2014   Menorrhagia 09/08/2014    REFERRING DIAG:  M48.02 (ICD-10-CM) - Spinal stenosis of cervical region M47.26 (ICD-10-CM) - Other spondylosis with radiculopathy, lumbar region M50.30 (ICD-10-CM) - Degenerative disc disease, cervical M47.812 (ICD-10-CM) - Spondylosis of cervical spine M77.8 (ICD-10-CM) - Tendinitis of shoulder, unspecified laterality  THERAPY DIAG:  No diagnosis found.  PERTINENT HISTORY:  HTN, peripheral neuropathy, DM II, PE, thyroid cancer, COPD  PRECAUTIONS: Fall  SUBJECTIVE:  ***  Pain:  Are you having pain? {yes/no:20286} NPRS: ***/10 Pain location: lower back; L hip; neck R shoulder 4/10 PAIN TYPE: sharp Pain description: intermittent  Aggravating factors: prolonged standing, walking Relieving factors: rest    OBJECTIVE:    PATIENT SURVEYS:  Modified Oswestry 86% disability  Quick Dash 87% disability    CERVICAL AROM/PROM   A/PROM A/PROM (deg) 08/06/2021  Right rotation 43 p!  Left rotation 35   (Blank rows = not tested)   UE AROM/PROM:   A/PROM Right 08/06/2021  Left 08/06/2021  Shoulder flexion 82 WFL  Shoulder extension      Shoulder abduction 75 WFL  Shoulder adduction      Shoulder extension      Shoulder internal rotation 15 WFL  Shoulder external rotation 20 WFL  Elbow flexion      Elbow extension      Wrist flexion      Wrist extension      Wrist ulnar deviation      Wrist radial deviation      Wrist pronation      Wrist supination       (Blank rows = not tested)   UE MMT:   MMT Right 08/06/2021 Left 08/06/2021  Shoulder flexion 2+/5 3+/5  Shoulder abduction 2+/5 3+/5  Shoulder internal rotation 3/5 3+/5  Shoulder external rotation 35 3+/5  Grip strength 55# 54#   (Blank rows = not tested)   LE MMT:   MMT Right 08/06/2021 Left 08/06/2021  Hip flexion 3+/5 2+/5  Hip abduction 3+/5 2+/5  Hip adduction 3+/5 3/5  Knee flexion 4/5 3/5  Knee extension 4/5 3/5   (Blank rows = not tested)   Edema:          2+ pitting edema L LE   FUNCTIONAL TESTS:  30 Second Sit to Stand: 2 reps     TODAY'S TREATMENT:  [facility] Adult PT Treatment:                                                DATE: 08/06/2021 Therapeutic Exercise: Seated clamshell GTB x 10 Seated ROW x 10 RTB Seated LAQ x 10 ea Manual Therapy: N/A Neuromuscular re-ed: N/A Therapeutic Activity: N/A Modalities: N/A Self Care: N/A       PATIENT EDUCATION:  Education details: eval findings, Quick DASH, ODI, HEP, POC Person educated: Patient Education method: Explanation, Demonstration, and Handouts Education comprehension: verbalized understanding and returned demonstration   HOME EXERCISE PROGRAM: Access Code: [email or web address] URL: [name] Date: 08/06/2021 Prepared by: Octavio Manns   Exercises Standing Shoulder Row with Anchored Resistance - 1 x daily - 7 x weekly - 3 sets - 10 reps Seated Hip Abduction with Resistance - 1 x daily - 7 x weekly - 3 sets - 10 reps Seated March with Resistance - 1 x daily - 7 x weekly - 2 sets - 20  reps Seated Long Arc Quad - 1 x daily - 7 x weekly - 3 sets - 10 reps     ASSESSMENT:   CLINICAL IMPRESSION: Patient is a 56 y.o. F who was seen today for physical therapy evaluation and treatment for chronic lower back, R shoulder, and neck pain. Objective impairments include Abnormal gait, decreased activity tolerance, decreased balance, decreased endurance, decreased mobility, difficulty walking, decreased ROM, decreased strength, obesity, and pain. These impairments are limiting patient from cleaning, community  activity, driving, meal prep, and church. Personal factors including Fitness, Time since onset of injury/illness/exacerbation, and 3+ comorbidities: HTN, peripheral neuropathy, DM II, PE, thyroid cancer, COPD  are also affecting patient's functional outcome. Her ODI and Quick DASH outcome measures indicate severe disability and shows she has significant difficulty with functional activities. Patient will benefit from skilled PT to address above impairments and improve overall function.   REHAB POTENTIAL: Fair (chronic health conditions)   CLINICAL DECISION MAKING: Evolving/moderate complexity   EVALUATION COMPLEXITY: Moderate     GOALS: Goals reviewed with patient? No   SHORT TERM GOALS:   STG Name Target Date Goal status  1 Pt will be compliant and knowledgeable with initial HEP for improved comfort and carryover Baseline: initial HEP given 08/27/2021 INITIAL  2 Pt will self report low back and neck/R shoulder pain no greater than 6/10 for improved comfort and functional ability Baseline: 10/10 at worst 08/27/2021 INITIAL    LONG TERM GOALS:    LTG Name Target Date Goal status  1 Pt will self report low back and neck/R shoulder pain no greater than 6/10 for improved comfort and functional ability Baseline: 10/10 at worst 10/01/2021 INITIAL  2 Pt will decrease Quick DASH disability score to no greater than 65% as proxy for functional improvement Baseline: 87% disability  10/01/2021 INITIAL  3 Pt will decrease ODI disability score to no greater than 70% as proxy for functional improvement Baseline: 86% disability 10/01/2021 INITIAL  4 Pt will increase reps in 30 Second Sit to Stand to no less than 5 reps for improved strength and functional mobility Baseline: 2 reps with UE support 10/01/2021 INITIAL  5 Pt will increase R shoulder flex/abd AROM to no less than 125 deg for improved functional ability with home ADLs Baseline: see chart 10/01/2021 INITIAL    PLAN: PT FREQUENCY: 2x/week   PT DURATION: 8 weeks   PLANNED INTERVENTIONS: Therapeutic exercises, Therapeutic activity, Neuro Muscular re-education, Balance training, Gait training, Patient/Family education, Joint mobilization, Aquatic Therapy, Cryotherapy, Moist heat, and Manual therapy   PLAN FOR NEXT SESSION: assess HEP response; progress LE and periscapular strengthening as able   Check all possible CPT codes: 97110- Therapeutic Exercise, [phone]- Neuro Re-education, [phone] - Gait Training, [phone] - Manual Therapy, 97530 - Therapeutic Activities, and 97535 - [place] C Jacee Enerson, PT 08/21/2021, 9:30 AM

## 2021-08-22 ENCOUNTER — Ambulatory Visit (INDEPENDENT_AMBULATORY_CARE_PROVIDER_SITE_OTHER): Payer: Medicaid Other | Admitting: Podiatry

## 2021-08-22 ENCOUNTER — Other Ambulatory Visit: Payer: Self-pay

## 2021-08-22 DIAGNOSIS — Z794 Long term (current) use of insulin: Secondary | ICD-10-CM

## 2021-08-22 DIAGNOSIS — M79675 Pain in left toe(s): Secondary | ICD-10-CM

## 2021-08-22 DIAGNOSIS — B351 Tinea unguium: Secondary | ICD-10-CM

## 2021-08-22 DIAGNOSIS — E118 Type 2 diabetes mellitus with unspecified complications: Secondary | ICD-10-CM | POA: Diagnosis not present

## 2021-08-22 DIAGNOSIS — M79674 Pain in right toe(s): Secondary | ICD-10-CM

## 2021-08-23 ENCOUNTER — Ambulatory Visit: Payer: Medicaid Other

## 2021-08-23 DIAGNOSIS — M6281 Muscle weakness (generalized): Secondary | ICD-10-CM | POA: Diagnosis not present

## 2021-08-23 DIAGNOSIS — R262 Difficulty in walking, not elsewhere classified: Secondary | ICD-10-CM

## 2021-08-23 NOTE — Therapy (Signed)
OUTPATIENT PHYSICAL THERAPY TREATMENT NOTE   Patient Name: Melinda [name] MRN: [id number] DOB:02/01/1966, 56 y.o., female Today's Date: 08/23/2021  PCP: [name], MD REFERRING PROVIDER: Jessy Oto, MD   PT End of Session - 08/23/21 1213     Visit Number 2    Number of Visits 17    Date for PT Re-Evaluation 10/01/21    Authorization Type [employer] MCD    PT Start Time 1215    PT Stop Time 1253    PT Time Calculation (min) 38 min    Activity Tolerance Patient limited by pain    Behavior During Therapy WFL for tasks assessed/performed             Past Medical History:  Diagnosis Date   ADHD (attention deficit hyperactivity disorder)    Arthritis    Asthma    Cancer ([place])    Thyroid   Chicken pox AGE 19   COPD (chronic obstructive pulmonary disease) ([place])    pt reported   Diabetes mellitus without complication ([place])    Diverticulosis, sigmoid    GERD (gastroesophageal reflux disease)    Glaucoma    BOTH EYES, NO EYE DROPS   Glaucoma    History of blood transfusion 08/2014    2UNITS GIVEN AND IRON GIVEN   Hyperglycemia 09/09/2014   Hypertension    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 ([place]) AGE 18   HAD TO LEARN TO WALK AGAIN   Iron deficiency anemia due to chronic blood loss 09/09/2014   Low TSH level 09/08/2014   Lumbar herniated disc    Menorrhagia 09/08/2014   Migraine    CLUSTER AND MIGRAINES   Multiple thyroid nodules    Ovarian mass, right 09/09/2014   PE (pulmonary embolism) 2016   Peripheral neuropathy    FINGER TIPS AND TOES NUMB SOME   Preeclampsia 1983   WITH PREGNANCY   PTSD (post-traumatic stress disorder)    Scoliosis    Seizures ([place]) AGE 19   NONE SINCE, HAD CHICKEN POX THEN   Past Surgical History:  Procedure Laterality Date   ANGIOGRAM TO LEG  08-13-15   RIGHT   CHOLECYSTECTOMY     IR GENERIC HISTORICAL  04/04/2016   IR RADIOLOGIST EVAL & MGMT 04/04/2016 [name], MD [facility] INTERV RAD   RADIOLOGY  WITH ANESTHESIA N/A 07/17/2021   Procedure: MRI LUMBAR SPINE WITHOUT CONTRAST; MRI CERVICAL SPINE WITHOUT CONTRAST WITH ANESTHESIA;  Surgeon: Radiologist, Medication, MD;  Location: [facility] OR;  Service: Radiology;  Laterality: N/A;   THYROIDECTOMY N/A 11/17/2015   Procedure: TOTAL THYROIDECTOMY;  Surgeon: Armandina Gemma, MD;  Location: [facility] ORS;  Service: General;  Laterality: N/A;   UTERINE ARTERY EMBOLIZATION Bilateral 08/13/2015   Patient Active Problem List   Diagnosis Date Noted   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   Abdominal pannus 08/21/2021   Aortic atherosclerosis ([place]) 08/21/2021   At high risk for falls 04/20/2020   Hyperlipidemia 02/16/2020   Adrenal mass, right ([place]) 04/29/2019   Chronic cough 03/12/2018   Left shoulder pain 10/14/2016   Hypertension 10/14/2016   S/P thyroidectomy 06/27/2016   Chronic bilateral low back pain without sciatica 04/22/2016   Liver lesion 12/24/2015   Controlled type 2 diabetes mellitus with complication, with long-term current use of insulin ([place]) 10/10/2015   Vision loss 08/25/2015   Decreased vision in both eyes 08/25/2015   Prolonged Q-T interval on ECG 08/19/2015   History of pulmonary embolism 08/10/2015  Mild persistent asthma 04/13/2015   Fibroid, uterine    Morbid obesity ([place]) 11/11/2014   Atypical chest pain 11/08/2014   Sinus tachycardia 11/08/2014   Migraine 10/14/2014   Menorrhagia 09/08/2014    REFERRING DIAG:  M48.02 (ICD-10-CM) - Spinal stenosis of cervical region M47.26 (ICD-10-CM) - Other spondylosis with radiculopathy, lumbar region M50.30 (ICD-10-CM) - Degenerative disc disease, cervical M47.812 (ICD-10-CM) - Spondylosis of cervical spine M77.8 (ICD-10-CM) - Tendinitis of shoulder, unspecified laterality  THERAPY DIAG:  Muscle weakness (generalized)  Difficulty in walking, not elsewhere classified  PERTINENT HISTORY:  HTN, peripheral neuropathy, DM II, PE, thyroid cancer, COPD  PRECAUTIONS:  Fall  SUBJECTIVE:  Pt presents to PT with continued reports of neck and R shoulder pain. Also notes continued L hip and lower back pain. She had a CT on her abdomen and may soon have surgery to remove panus and adrenal mass. Pt is ready to begin PT a this time.   Pain: Are you having pain? Yes NPRS: 5/10 neck and R shoulder, L hip 7/10, R hip 3/10 Pain location: bilateral hips; neck R shoulder PAIN TYPE: sharp Pain description: intermittent  Aggravating factors: prolonged standing, walking Relieving factors: rest    OBJECTIVE:    PATIENT SURVEYS:  Modified Oswestry 86% disability  Quick Dash 87% disability    CERVICAL AROM/PROM   A/PROM A/PROM (deg) 08/06/2021  Right rotation 43 p!  Left rotation 35   (Blank rows = not tested)   UE AROM/PROM:   A/PROM Right 08/06/2021 Left 08/06/2021  Shoulder flexion 82 WFL  Shoulder extension      Shoulder abduction 75 WFL  Shoulder adduction      Shoulder extension      Shoulder internal rotation 15 WFL  Shoulder external rotation 20 WFL  Elbow flexion      Elbow extension      Wrist flexion      Wrist extension      Wrist ulnar deviation      Wrist radial deviation      Wrist pronation      Wrist supination       (Blank rows = not tested)   UE MMT:   MMT Right 08/06/2021 Left 08/06/2021  Shoulder flexion 2+/5 3+/5  Shoulder abduction 2+/5 3+/5  Shoulder internal rotation 3/5 3+/5  Shoulder external rotation 35 3+/5  Grip strength 55# 54#   (Blank rows = not tested)   LE MMT:   MMT Right 08/06/2021 Left 08/06/2021  Hip flexion 3+/5 2+/5  Hip abduction 3+/5 2+/5  Hip adduction 3+/5 3/5  Knee flexion 4/5 3/5  Knee extension 4/5 3/5   (Blank rows = not tested)   Edema:          2+ pitting edema L LE   FUNCTIONAL TESTS:  30 Second Sit to Stand: 2 reps     TODAY'S TREATMENT:  [facility] Adult PT Treatment:                                                DATE: 08/23/2021 Therapeutic Exercise: NuStep lvl 5 UE/LE x 3 min  while taking subjective Seated clamshell GTB 3x15 Seated march 2x20 GTB Seated horizontal abd 2x10 RTB Seated Row 2x12 GTB Seated LAQ 2x10 2.5# each STS x 5 - with UE support and elevated table Manual Therapy: N/A Neuromuscular re-ed: N/A Therapeutic Activity: N/A  Modalities: N/A Self Care: N/A  [facility] Adult PT Treatment:                                                DATE: 08/06/2021 Therapeutic Exercise: Seated clamshell GTB x 10 Seated ROW x 10 RTB Seated LAQ x 10 ea Manual Therapy: N/A Neuromuscular re-ed: N/A Therapeutic Activity: N/A Modalities: N/A Self Care: N/A       PATIENT EDUCATION:  Education details: continue HEP Person educated: Patient Education method: Explanation, Demonstration, and Handouts Education comprehension: verbalized understanding and returned demonstration   HOME EXERCISE PROGRAM: Access Code: [email or web address] URL: [email or web address] Date: 08/23/2021 Prepared by: Octavio Manns  Exercises Standing Shoulder Row with Anchored Resistance - 1 x daily - 7 x weekly - 3 sets - 10 reps Seated Hip Abduction with Resistance - 1 x daily - 7 x weekly - 3 sets - 10 reps Seated March with Resistance - 1 x daily - 7 x weekly - 2 sets - 20 reps Seated Long Arc Quad - 1 x daily - 7 x weekly - 3 sets - 10 reps Seated Shoulder Horizontal Abduction with Resistance - Palms Down - 1 x daily - 7 x weekly - 2 sets - 10 reps     ASSESSMENT: Pt was able to complete all prescribed exercises with no adverse effect, although she was limited by continued fatigue. Therapy today focused on seated LE and periscapular strengthening in effort to reduce hip and shoulder pain. She continues to benefit from skilled PT services, as she needs careful monitoring of fatigue and vitals during exercise while continuing to progress. Will continue with current POC as tolerated.    Problem List: Abnormal gait, decreased activity tolerance, decreased balance, decreased  endurance, decreased mobility, difficulty walking, decreased ROM, decreased strength, obesity, and pain     GOALS: Goals reviewed with patient? No   SHORT TERM GOALS:   STG Name Target Date Goal status  1 Pt will be compliant and knowledgeable with initial HEP for improved comfort and carryover Baseline: initial HEP given 08/27/2021 MET  2 Pt will self report low back and neck/R shoulder pain no greater than 6/10 for improved comfort and functional ability Baseline: 10/10 at worst 08/27/2021 ONGOING    LONG TERM GOALS:    LTG Name Target Date Goal status  1 Pt will self report low back and neck/R shoulder pain no greater than 6/10 for improved comfort and functional ability Baseline: 10/10 at worst 10/01/2021 INITIAL  2 Pt will decrease Quick DASH disability score to no greater than 65% as proxy for functional improvement Baseline: 87% disability 10/01/2021 INITIAL  3 Pt will decrease ODI disability score to no greater than 70% as proxy for functional improvement Baseline: 86% disability 10/01/2021 INITIAL  4 Pt will increase reps in 30 Second Sit to Stand to no less than 5 reps for improved strength and functional mobility Baseline: 2 reps with UE support 10/01/2021 INITIAL  5 Pt will increase R shoulder flex/abd AROM to no less than 125 deg for improved functional ability with home ADLs Baseline: see chart 10/01/2021 INITIAL    PLAN: PT FREQUENCY: 2x/week   PT DURATION: 8 weeks   PLANNED INTERVENTIONS: Therapeutic exercises, Therapeutic activity, Neuro Muscular re-education, Balance training, Gait training, Patient/Family education, Joint mobilization, Aquatic Therapy, Cryotherapy, Moist heat, and Manual therapy   PLAN FOR NEXT SESSION:  assess HEP response; progress LE and periscapular strengthening as able   Check all possible CPT codes: 32023- Therapeutic Exercise, [phone]- Neuro Re-education, [phone] - Gait Training, [phone] - Manual Therapy, [phone] - Therapeutic Activities, and [id number] - [place], PT 08/23/2021, 12:55 PM

## 2021-08-24 ENCOUNTER — Ambulatory Visit: Payer: Self-pay

## 2021-08-24 ENCOUNTER — Ambulatory Visit (INDEPENDENT_AMBULATORY_CARE_PROVIDER_SITE_OTHER): Payer: Medicaid Other | Admitting: Specialist

## 2021-08-24 ENCOUNTER — Encounter: Payer: Self-pay | Admitting: Specialist

## 2021-08-24 ENCOUNTER — Other Ambulatory Visit: Payer: Self-pay

## 2021-08-24 VITALS — BP 108/72 | HR 83 | Ht 70.0 in | Wt 351.0 lb

## 2021-08-24 DIAGNOSIS — M7501 Adhesive capsulitis of right shoulder: Secondary | ICD-10-CM

## 2021-08-24 DIAGNOSIS — Z6841 Body Mass Index (BMI) 40.0 and over, adult: Secondary | ICD-10-CM | POA: Diagnosis not present

## 2021-08-24 DIAGNOSIS — M778 Other enthesopathies, not elsewhere classified: Secondary | ICD-10-CM | POA: Diagnosis not present

## 2021-08-24 DIAGNOSIS — M25511 Pain in right shoulder: Secondary | ICD-10-CM

## 2021-08-24 DIAGNOSIS — G8929 Other chronic pain: Secondary | ICD-10-CM

## 2021-08-24 NOTE — Progress Notes (Signed)
Office Visit Note   Patient: Melinda [name]           Date of Birth: 1966-06-08           MRN: [id number] Visit Date: 08/24/2021              Requested by: [name], MD [id number] [place],  [place] [id number] PCP: [name], MD   Assessment & Plan: Visit Diagnoses:  1. Tendinitis of shoulder, unspecified laterality   2. Chronic right shoulder pain   3. Body mass index 50.0-59.9, adult ([place])   4. Adhesive capsulitis of right shoulder     Plan: Avoid bending, stooping and avoid lifting weights greater than 10 lbs. Avoid prolong standing and walking. Avoid frequent bending and stooping  No lifting greater than 10 lbs. May use ice or moist heat for pain. Weight loss is of benefit. Handicap license is approved. Dr. [name] secretary/Assistant will call to arrange for nerve testing with EMG/NCV left arm.  Avoid overhead lifting and overhead use of the arms. Do not lift greater than 5 lbs. Adjust head rest in vehicle to prevent hyperextension if rear ended. Take extra precautions to avoid falling, including use of a cane if you feel weak. PT for right shoulder tendonitis and right frozen shoulder or adhesive capsulitis. Follow-Up Instructions: Return in about 4 weeks (around 09/21/2021).   Orders:  Orders Placed This Encounter  Procedures   XR Shoulder Right   No orders of the defined types were placed in this encounter.     Procedures: No procedures performed   Clinical Data: No additional findings.   Subjective: Chief Complaint  Patient presents with   Lower Back - Follow-up    Had Right L5-S1 IL injection with Dr. Ernestina Patches on 08/20/21    56 year old female with neck and low back pain. She is undergoing PT for balance and coordination with no new episodes of falling since last seen.  Review of Systems  Constitutional: Negative.   HENT: Negative.    Eyes: Negative.   Respiratory: Negative.    Cardiovascular: Negative.   Gastrointestinal:  Negative.   Endocrine: Negative.   Genitourinary: Negative.   Musculoskeletal: Negative.   Skin: Negative.   Allergic/Immunologic: Negative.   Neurological: Negative.   Hematological: Negative.   Psychiatric/Behavioral: Negative.      Objective: Vital Signs: BP 108/72 (BP Location: Left Arm, Patient Position: Sitting)    Pulse 83    Ht 5\' 10"  (1.778 m)    Wt (!) 351 lb (159.2 kg)    BMI 50.36 kg/m   Physical Exam Constitutional:      Appearance: She is well-developed.  HENT:     Head: Normocephalic and atraumatic.  Eyes:     Pupils: Pupils are equal, round, and reactive to light.  Pulmonary:     Effort: Pulmonary effort is normal.     Breath sounds: Normal breath sounds.  Abdominal:     General: Bowel sounds are normal.     Palpations: Abdomen is soft.  Musculoskeletal:     Cervical back: Normal range of motion and neck supple.  Skin:    General: Skin is warm and dry.  Neurological:     Mental Status: She is alert and oriented to person, place, and time.  Psychiatric:        Behavior: Behavior normal.        Thought Content: Thought content normal.        Judgment: Judgment normal.  Back Exam   Tenderness  The patient is experiencing tenderness in the lumbar and cervical.  Range of Motion  Extension:  abnormal  Flexion:  abnormal  Lateral bend right:  abnormal  Lateral bend left:  abnormal  Rotation right:  abnormal  Rotation left:  abnormal     Specialty Comments:  No specialty comments available.  Imaging: No results found.   PMFS History: Patient Active Problem List   Diagnosis Date Noted   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   Abdominal pannus 08/21/2021   Aortic atherosclerosis ([place]) 08/21/2021   At high risk for falls 04/20/2020   Hyperlipidemia 02/16/2020   Adrenal mass, right ([place]) 04/29/2019   Chronic cough 03/12/2018   Left shoulder pain 10/14/2016   Hypertension 10/14/2016   S/P thyroidectomy 06/27/2016   Chronic  bilateral low back pain without sciatica 04/22/2016   Liver lesion 12/24/2015   Controlled type 2 diabetes mellitus with complication, with long-term current use of insulin ([place]) 10/10/2015   Vision loss 08/25/2015   Decreased vision in both eyes 08/25/2015   Prolonged Q-T interval on ECG 08/19/2015   History of pulmonary embolism 08/10/2015   Mild persistent asthma 04/13/2015   Fibroid, uterine    Morbid obesity ([place]) 11/11/2014   Atypical chest pain 11/08/2014   Sinus tachycardia 11/08/2014   Migraine 10/14/2014   Menorrhagia 09/08/2014   Past Medical History:  Diagnosis Date   ADHD (attention deficit hyperactivity disorder)    Arthritis    Asthma    Cancer ([place])    Thyroid   Chicken pox AGE 19   COPD (chronic obstructive pulmonary disease) ([place])    pt reported   Diabetes mellitus without complication ([facility])    Diverticulosis, sigmoid    GERD (gastroesophageal reflux disease)    Glaucoma    BOTH EYES, NO EYE DROPS   Glaucoma    History of blood transfusion 08/2014    2UNITS GIVEN AND IRON GIVEN   Hyperglycemia 09/09/2014   Hypertension    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 ([place]) AGE 44   HAD TO LEARN TO WALK AGAIN   Iron deficiency anemia due to chronic blood loss 09/09/2014   Low TSH level 09/08/2014   Lumbar herniated disc    Menorrhagia 09/08/2014   Migraine    CLUSTER AND MIGRAINES   Multiple thyroid nodules    Ovarian mass, right 09/09/2014   PE (pulmonary embolism) 2016   Peripheral neuropathy    FINGER TIPS AND TOES NUMB SOME   Preeclampsia 1983   WITH PREGNANCY   PTSD (post-traumatic stress disorder)    Scoliosis    Seizures ([facility]) AGE 77   NONE SINCE, HAD CHICKEN POX THEN    Family History  Problem Relation Age of Onset   Diabetes Mother    Breast cancer Mother    CAD Mother    Hypertension Mother    Alcohol abuse Father    Hypertension Father    Breast cancer Maternal Aunt    Breast cancer Maternal Aunt     Past  Surgical History:  Procedure Laterality Date   ANGIOGRAM TO LEG  08-13-15   RIGHT   CHOLECYSTECTOMY     IR GENERIC HISTORICAL  04/04/2016   IR RADIOLOGIST EVAL & MGMT 04/04/2016 [name], MD [facility] INTERV RAD   RADIOLOGY WITH ANESTHESIA N/A 07/17/2021   Procedure: MRI LUMBAR SPINE WITHOUT CONTRAST; MRI CERVICAL SPINE WITHOUT CONTRAST WITH ANESTHESIA;  Surgeon: Radiologist, Medication, MD;  Location: [facility]  OR;  Service: Radiology;  Laterality: N/A;   THYROIDECTOMY N/A 11/17/2015   Procedure: TOTAL THYROIDECTOMY;  Surgeon: Armandina Gemma, MD;  Location: [facility];  Service: General;  Laterality: N/A;   UTERINE ARTERY EMBOLIZATION Bilateral 08/13/2015   Social History   Occupational History   Occupation: disabled  Tobacco Use   Smoking status: Former    Packs/day: 0.50    Years: 40.00    Pack years: 20.00    Types: Cigarettes    Quit date: 11/03/2014    Years since quitting: 6.8   Smokeless tobacco: Never  Vaping Use   Vaping Use: Never used  Substance and Sexual Activity   Alcohol use: No    Alcohol/week: 0.0 standard drinks   Drug use: No   Sexual activity: Never    Birth control/protection: None

## 2021-08-24 NOTE — Patient Instructions (Signed)
Plan: Avoid bending, stooping and avoid lifting weights greater than 10 lbs. Avoid prolong standing and walking. Avoid frequent bending and stooping  No lifting greater than 10 lbs. May use ice or moist heat for pain. Weight loss is of benefit. Handicap license is approved. Dr. [name] secretary/Assistant will call to arrange for nerve testing with EMG/NCV left arm.  Avoid overhead lifting and overhead use of the arms. Do not lift greater than 5 lbs. Adjust head rest in vehicle to prevent hyperextension if rear ended. Take extra precautions to avoid falling, including use of a cane if you feel weak. PT for right shoulder tendonitis and right frozen shoulder or adhesive capsulitis. Follow-Up Instructions: Return in about 4 weeks (around 09/21/2021).

## 2021-08-24 NOTE — Progress Notes (Signed)
°  Subjective:  Patient ID: Melinda [name], female    DOB: 11/17/1965,  MRN: [id number]  Chief Complaint  Patient presents with   Nail Problem    Nail trim    56 y.o. female returns for the above complaint.  Patient presents with thickened elongated dystrophic toenails x10.  Mild pain on palpation.  Patient would like to have them debrided down.  She denies any other acute complaint she is not able to do her self.  She is a diabetic with last A1c of 6.1  Objective:  There were no vitals filed for this visit. Podiatric Exam: Vascular: dorsalis pedis and posterior tibial pulses are palpable bilateral. Capillary return is immediate. Temperature gradient is WNL. Skin turgor WNL  Sensorium: Normal Semmes Weinstein monofilament test. Normal tactile sensation bilaterally. Nail Exam: Pt has thick disfigured discolored nails with subungual debris noted bilateral entire nail hallux through fifth toenails.  Pain on palpation to the nails. Ulcer Exam: There is no evidence of ulcer or pre-ulcerative changes or infection. Orthopedic Exam: Muscle tone and strength are WNL. No limitations in general ROM. No crepitus or effusions noted.  Skin: No Porokeratosis. No infection or ulcers    Assessment & Plan:   1. Pain due to onychomycosis of toenails of both feet   2. Controlled type 2 diabetes mellitus with complication, with long-term current use of insulin ([place])     Patient was evaluated and treated and all questions answered.  Onychomycosis with pain  -Nails palliatively debrided as below. -Educated on self-care  Procedure: Nail Debridement Rationale: pain  Type of Debridement: manual, sharp debridement. Instrumentation: Nail nipper, rotary burr. Number of Nails: 10  Procedures and Treatment: Consent by patient was obtained for treatment procedures. The patient understood the discussion of treatment and procedures well. All questions were answered thoroughly reviewed. Debridement of mycotic and  hypertrophic toenails, 1 through 5 bilateral and clearing of subungual debris. No ulceration, no infection noted.  Return Visit-Office Procedure: Patient instructed to return to the office for a follow up visit 3 months for continued evaluation and treatment.  Boneta Lucks, DPM    No follow-ups on file.

## 2021-08-27 ENCOUNTER — Encounter: Payer: Self-pay | Admitting: Internal Medicine

## 2021-08-27 DIAGNOSIS — F4325 Adjustment disorder with mixed disturbance of emotions and conduct: Secondary | ICD-10-CM | POA: Diagnosis not present

## 2021-08-28 ENCOUNTER — Ambulatory Visit: Payer: Medicaid Other

## 2021-08-30 ENCOUNTER — Ambulatory Visit: Payer: Medicaid Other | Attending: Specialist

## 2021-08-30 ENCOUNTER — Other Ambulatory Visit: Payer: Self-pay

## 2021-08-30 DIAGNOSIS — M436 Torticollis: Secondary | ICD-10-CM | POA: Insufficient documentation

## 2021-08-30 DIAGNOSIS — R2689 Other abnormalities of gait and mobility: Secondary | ICD-10-CM | POA: Insufficient documentation

## 2021-08-30 DIAGNOSIS — G8929 Other chronic pain: Secondary | ICD-10-CM | POA: Insufficient documentation

## 2021-08-30 DIAGNOSIS — M25511 Pain in right shoulder: Secondary | ICD-10-CM | POA: Diagnosis present

## 2021-08-30 DIAGNOSIS — R293 Abnormal posture: Secondary | ICD-10-CM | POA: Insufficient documentation

## 2021-08-30 DIAGNOSIS — R262 Difficulty in walking, not elsewhere classified: Secondary | ICD-10-CM | POA: Diagnosis present

## 2021-08-30 DIAGNOSIS — R2681 Unsteadiness on feet: Secondary | ICD-10-CM | POA: Insufficient documentation

## 2021-08-30 DIAGNOSIS — M6281 Muscle weakness (generalized): Secondary | ICD-10-CM | POA: Diagnosis present

## 2021-08-30 DIAGNOSIS — R252 Cramp and spasm: Secondary | ICD-10-CM | POA: Diagnosis present

## 2021-08-30 DIAGNOSIS — M5412 Radiculopathy, cervical region: Secondary | ICD-10-CM | POA: Insufficient documentation

## 2021-08-30 NOTE — Therapy (Signed)
OUTPATIENT PHYSICAL THERAPY TREATMENT NOTE   Patient Name: Melinda [name] MRN: [id number] DOB:[birth year], 56 y.o., female Today's Date: 08/30/2021  PCP: [name], MD REFERRING PROVIDER: Jessy Oto, MD   PT End of Session - 08/30/21 1158     Visit Number 3    Number of Visits 17    Date for PT Re-Evaluation 10/01/21    Authorization Type [employer] MCD    PT Start Time 1212    PT Stop Time 1251    PT Time Calculation (min) 39 min    Activity Tolerance Patient limited by pain    Behavior During Therapy WFL for tasks assessed/performed              Past Medical History:  Diagnosis Date   ADHD (attention deficit hyperactivity disorder)    Arthritis    Asthma    Cancer ([place])    Thyroid   Chicken pox AGE 77   COPD (chronic obstructive pulmonary disease) ([place])    pt reported   Diabetes mellitus without complication ([place])    Diverticulosis, sigmoid    GERD (gastroesophageal reflux disease)    Glaucoma    BOTH EYES, NO EYE DROPS   Glaucoma    History of blood transfusion 08/2014    2UNITS GIVEN AND IRON GIVEN   Hyperglycemia 09/09/2014   Hypertension    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 ([place]) AGE 76   HAD TO LEARN TO WALK AGAIN   Iron deficiency anemia due to chronic blood loss 09/09/2014   Low TSH level 09/08/2014   Lumbar herniated disc    Menorrhagia 09/08/2014   Migraine    CLUSTER AND MIGRAINES   Multiple thyroid nodules    Ovarian mass, right 09/09/2014   PE (pulmonary embolism) 2016   Peripheral neuropathy    FINGER TIPS AND TOES NUMB SOME   Preeclampsia 1983   WITH PREGNANCY   PTSD (post-traumatic stress disorder)    Scoliosis    Seizures ([place]) AGE 77   NONE SINCE, HAD CHICKEN POX THEN   Past Surgical History:  Procedure Laterality Date   ANGIOGRAM TO LEG  08-13-15   RIGHT   CHOLECYSTECTOMY     IR GENERIC HISTORICAL  04/04/2016   IR RADIOLOGIST EVAL & MGMT 04/04/2016 [name], MD [facility] INTERV RAD   RADIOLOGY  WITH ANESTHESIA N/A 07/17/2021   Procedure: MRI LUMBAR SPINE WITHOUT CONTRAST; MRI CERVICAL SPINE WITHOUT CONTRAST WITH ANESTHESIA;  Surgeon: Radiologist, Medication, MD;  Location: [facility] OR;  Service: Radiology;  Laterality: N/A;   THYROIDECTOMY N/A 11/17/2015   Procedure: TOTAL THYROIDECTOMY;  Surgeon: Armandina Gemma, MD;  Location: [facility] ORS;  Service: General;  Laterality: N/A;   UTERINE ARTERY EMBOLIZATION Bilateral 08/13/2015   Patient Active Problem List   Diagnosis Date Noted   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   Abdominal pannus 08/21/2021   Aortic atherosclerosis ([place]) 08/21/2021   At high risk for falls 04/20/2020   Hyperlipidemia 02/16/2020   Adrenal mass, right ([place]) 04/29/2019   Chronic cough 03/12/2018   Left shoulder pain 10/14/2016   Hypertension 10/14/2016   S/P thyroidectomy 06/27/2016   Chronic bilateral low back pain without sciatica 04/22/2016   Liver lesion 12/24/2015   Controlled type 2 diabetes mellitus with complication, with long-term current use of insulin ([place]) 10/10/2015   Vision loss 08/25/2015   Decreased vision in both eyes 08/25/2015   Prolonged Q-T interval on ECG 08/19/2015   History of pulmonary embolism 08/10/2015  Mild persistent asthma 04/13/2015   Fibroid, uterine    Morbid obesity ([place]) 11/11/2014   Atypical chest pain 11/08/2014   Sinus tachycardia 11/08/2014   Migraine 10/14/2014   Menorrhagia 09/08/2014    REFERRING DIAG:  M48.02 (ICD-10-CM) - Spinal stenosis of cervical region M47.26 (ICD-10-CM) - Other spondylosis with radiculopathy, lumbar region M50.30 (ICD-10-CM) - Degenerative disc disease, cervical M47.812 (ICD-10-CM) - Spondylosis of cervical spine M77.8 (ICD-10-CM) - Tendinitis of shoulder, unspecified laterality  THERAPY DIAG:  Muscle weakness (generalized)  Difficulty in walking, not elsewhere classified  PERTINENT HISTORY:  HTN, peripheral neuropathy, DM II, PE, thyroid cancer, COPD  PRECAUTIONS:  Fall  SUBJECTIVE:  Pt presents to PT with reports of decreased pain in R shoulder and neck, does have bilateral knee and hip pain. Pt is ready to begin PT at this time.   Pain: Are you having pain? Yes NPRS: 8/10 bilateral knees Pain location: bilateral knees PAIN TYPE: sharp Pain description: intermittent  Aggravating factors: prolonged standing, walking Relieving factors: rest    OBJECTIVE:    PATIENT SURVEYS:  Modified Oswestry 86% disability  Quick Dash 87% disability    CERVICAL AROM/PROM   A/PROM A/PROM (deg) 08/06/2021  Right rotation 43 p!  Left rotation 35   (Blank rows = not tested)   UE AROM/PROM:   A/PROM Right 08/06/2021 Left 08/06/2021  Shoulder flexion 82 WFL  Shoulder extension      Shoulder abduction 75 WFL  Shoulder adduction      Shoulder extension      Shoulder internal rotation 15 WFL  Shoulder external rotation 20 WFL  Elbow flexion      Elbow extension      Wrist flexion      Wrist extension      Wrist ulnar deviation      Wrist radial deviation      Wrist pronation      Wrist supination       (Blank rows = not tested)   UE MMT:   MMT Right 08/06/2021 Left 08/06/2021  Shoulder flexion 2+/5 3+/5  Shoulder abduction 2+/5 3+/5  Shoulder internal rotation 3/5 3+/5  Shoulder external rotation 35 3+/5  Grip strength 55# 54#   (Blank rows = not tested)   LE MMT:   MMT Right 08/06/2021 Left 08/06/2021  Hip flexion 3+/5 2+/5  Hip abduction 3+/5 2+/5  Hip adduction 3+/5 3/5  Knee flexion 4/5 3/5  Knee extension 4/5 3/5   (Blank rows = not tested)   Edema:          2+ pitting edema L LE   FUNCTIONAL TESTS:  30 Second Sit to Stand: 2 reps     TODAY'S TREATMENT:  [facility] Adult PT Treatment:                                                DATE: 08/30/2021 Therapeutic Exercise: Seated clamshell GTB 3x15 Seated march 2x20 GTB Seated horizontal abd 3x20 GTB Seated Row 2x10 BTB Seated LAQ 3x10 3# each Seated bilateral ER YTB 3x10 STS x 5 -  with UE support and elevated table  [facility] Adult PT Treatment:  DATE: 08/23/2021 Therapeutic Exercise: NuStep lvl 5 UE/LE x 3 min while taking subjective Seated clamshell GTB 3x15 Seated march 2x20 GTB Seated horizontal abd 2x10 RTB Seated Row 2x12 GTB Seated LAQ 2x10 2.5# each STS 2x5 - with UE support and elevated table   PATIENT EDUCATION:  Education details: continue HEP Person educated: Patient Education method: Explanation, Demonstration, and Handouts Education comprehension: verbalized understanding and returned demonstration   HOME EXERCISE PROGRAM: Access Code: [email or web address] URL: [email or web address] Date: 08/23/2021 Prepared by: Octavio Manns  Exercises Standing Shoulder Row with Anchored Resistance - 1 x daily - 7 x weekly - 3 sets - 10 reps Seated Hip Abduction with Resistance - 1 x daily - 7 x weekly - 3 sets - 10 reps Seated March with Resistance - 1 x daily - 7 x weekly - 2 sets - 20 reps Seated Long Arc Quad - 1 x daily - 7 x weekly - 3 sets - 10 reps Seated Shoulder Horizontal Abduction with Resistance - Palms Down - 1 x daily - 7 x weekly - 2 sets - 10 reps     ASSESSMENT: Pt was able to complete all prescribed exercises with no adverse effect. Therapy today focused on improving shoulder and periscapular strength as well as proximal hip and quad strength in order to decrease pain and improve mobility. She continues to be limited by decreased activity tolerance, but is progressing well with therapy thus far. Pt continues to benefit from skilled PT services working on improving strength and endurance and will continue to be progressed as tolerated.   Problem List: Abnormal gait, decreased activity tolerance, decreased balance, decreased endurance, decreased mobility, difficulty walking, decreased ROM, decreased strength, obesity, and pain     GOALS: Goals reviewed with patient? No   SHORT TERM GOALS:   STG  Name Target Date Goal status  1 Pt will be compliant and knowledgeable with initial HEP for improved comfort and carryover Baseline: initial HEP given 08/27/2021 MET  2 Pt will self report low back and neck/R shoulder pain no greater than 6/10 for improved comfort and functional ability Baseline: 10/10 at worst 08/27/2021 ONGOING    LONG TERM GOALS:    LTG Name Target Date Goal status  1 Pt will self report low back and neck/R shoulder pain no greater than 6/10 for improved comfort and functional ability Baseline: 10/10 at worst 10/01/2021 INITIAL  2 Pt will decrease Quick DASH disability score to no greater than 65% as proxy for functional improvement Baseline: 87% disability 10/01/2021 INITIAL  3 Pt will decrease ODI disability score to no greater than 70% as proxy for functional improvement Baseline: 86% disability 10/01/2021 INITIAL  4 Pt will increase reps in 30 Second Sit to Stand to no less than 5 reps for improved strength and functional mobility Baseline: 2 reps with UE support 10/01/2021 INITIAL  5 Pt will increase R shoulder flex/abd AROM to no less than 125 deg for improved functional ability with home ADLs Baseline: see chart 10/01/2021 INITIAL    PLAN: PT FREQUENCY: 2x/week   PT DURATION: 8 weeks   PLANNED INTERVENTIONS: Therapeutic exercises, Therapeutic activity, Neuro Muscular re-education, Balance training, Gait training, Patient/Family education, Joint mobilization, Aquatic Therapy, Cryotherapy, Moist heat, and Manual therapy   PLAN FOR NEXT SESSION: assess HEP response; progress LE and periscapular strengthening as able   Check all possible CPT codes: 97110- Therapeutic Exercise, [phone]- Neuro Re-education, [phone] - Gait Training, 97140 - Manual Therapy, 97530 - Therapeutic Activities, and 97535 - Self Care  Ward Chatters, PT 08/30/2021, 12:52 PM

## 2021-08-31 ENCOUNTER — Other Ambulatory Visit: Payer: Self-pay | Admitting: Obstetrics and Gynecology

## 2021-08-31 NOTE — Patient Instructions (Signed)
Hi Ms. Diana to speak to you today-I hope you have a nice afternoon and weekend ? ?Ms. Koopman was given information about Medicaid Managed Care team care coordination services as a part of their [place] Medicaid benefit. [name] verbally consented to engagement with the [facility] Managed Care team.  ? ?If you are experiencing a medical emergency, please call 911 or report to your local emergency department or urgent care.  ? ?If you have a non-emergency medical problem during routine business hours, please contact your provider's office and ask to speak with a nurse.  ? ?For questions related to your [facility], please call: [phone] or visit the homepage here: [email or web address] ? ?If you would like to schedule transportation through your [facility], please call the following number at least 2 days in advance of your appointment: [phone]. ? Rides for urgent appointments can also be made after hours by calling Member Services. ? ?Call the [place] at [phone], at any time, 24 hours a day, 7 days a week. If you are in danger or need immediate medical attention call 911. ? ?If you would like help to quit smoking, call 1-800-QUIT-NOW (256)707-9399) OR Espa?ol: 1-855-D?jelo-Ya 586-809-4518) o para m?s informaci?n haga clic aqu? or Text READY to 200-400 to register via text ? ?Ms. Hensarling - following are the goals we discussed in your visit today:  ? Goals Addressed   ? ?  ?  ?  ?  ? This Visit's Progress  ?  Protect My Health     ?   ? ?Timeframe:  Long-Range Goal ?Priority:  Medium ?Start Date:    07/14/20                         ?Expected End Date:  ongoing         ? ?Follow Up Date: 10/01/21 ?- schedule appointment for vaccines needed due to my age or health ?- schedule recommended health tests (blood work, mammogram, colonoscopy, pap test) ?-  schedule and keep appointment for annual check-up  ? ?08/31/21:  patient has started PT twice a week, has endocrinologist appt this month  ? ?Patient verbalizes understanding of instructions and care plan provided today and agrees to view in [place]. Active MyChart status confirmed with patient.   ? ?The Managed Medicaid care management team will reach out to the patient again over the next 30 days.  ?The  Patient  has been provided with contact information for the Managed Medicaid care management team and has been advised to call with any health related questions or concerns.  ? ?Aida Raider RN, BSN ?[facility] ?Care Management Coordinator - Managed Medicaid High Risk ?[phone] ? ?Following is a copy of your plan of care:  ?Care Plan : General Plan of Care (Adult)  ?Updates made by Gayla Medicus, RN since 08/31/2021 12:00 AM  ?  ? ?Problem: Health Promotion or Disease Self-Management (General Plan of Care)   ?Priority: Medium  ?Onset Date: 10/20/2020  ?  ? ?Long-Range Goal: Self-Management Plan Developed   ?Start Date: 07/14/2020  ?Expected End Date: 10/03/2021  ?Recent Progress: On track  ?Priority: Medium  ?Note:   ? ?Current Barriers:  ?Chronic Disease Management support and education needs ?Patient with anxiety and depression-has appointment with Psychiatrist  scheduled every 3 months, therapist every 2 weeks ?08/31/21:  patient without complaint today, has started PT twice  a week.   ?Nurse Case Manager Clinical Goal(s):  ?Over the next 90 days, patient will attend all scheduled medical appointments:  ?Over the next 30 days, patient will work with CM team pharmacist to review medications. ?Interventions:  ?Inter-disciplinary care team collaboration (see longitudinal plan of care) ?Evaluation of current treatment plan  and patient's adherence to plan as established by provider. ?Reviewed medications with patient. ?Collaborated with pharmacy regarding medications. ?Discussed plans with  patient for ongoing care management follow up and provided patient with direct contact information for care management team ?Reviewed scheduled/upcoming provider appointments. ?Collaborated with SW for psychotherapy referral. ?SW referral for psychotherapy provider-completed ?Pharmacy referral for medication review. ?Self Care Activities: ?Over the next 90 days, patient will: ? -Self administers medications as prescribed ?Attends all scheduled provider appointments ?Calls pharmacy for medication refills ?Calls provider office for new concerns or questions ? ?Follow Up Plan: The Managed Medicaid care management team will reach out to the patient again over the next 30 days.  ?The patient has been provided with contact information for the Managed Medicaid care management team and has been advised to call with any health related questions or concerns.   ?  ?

## 2021-08-31 NOTE — Patient Outreach (Signed)
Medicaid Managed Care   Nurse Care Manager Note  08/31/2021 Name:  Melinda [name] MRN:  [id number] DOB:  02/13/1966  Melinda [name] is an 56 y.o. year old female who is a primary patient of [name], MD.  [facility] Managed Care Coordination team was consulted for assistance with:    Chronic healthcare management needs, DM, asthma, HTN, migraines, anxiety/depression, chronic pain, HLD  Melinda [name] was given information about Medicaid Managed Care Coordination team services today. Melinda [name] Patient agreed to services and verbal consent obtained.  Engaged with patient by telephone for follow up visit in response to provider referral for case management and/or care coordination services.   Assessments/Interventions:  Review of past medical history, allergies, medications, health status, including review of consultants reports, laboratory and other test data, was performed as part of comprehensive evaluation and provision of chronic care management services.  SDOH (Social Determinants of Health) assessments and interventions performed: SDOH Interventions    Flowsheet Row Most Recent Value  SDOH Interventions   Intimate Partner Violence Interventions Intervention Not Indicated      Care Plan  Allergies  Allergen Reactions   Ceftriaxone Anaphylaxis    ROCEPHIN   Metformin Anaphylaxis    ALL   Penicillins Shortness Of Breath    Has patient had a PCN reaction causing immediate rash, facial/tongue/throat swelling, SOB or lightheadedness with hypotension: Yes Has patient had a PCN reaction causing severe rash involving mucus membranes or skin necrosis: No Has patient had a PCN reaction that required hospitalization No Has patient had a PCN reaction occurring within the last 10 years: No If all of the above answers are "NO", then [name] proceed with Cephalosporin use.    Shellfish Allergy Anaphylaxis   Flonase [Fluticasone Propionate]     Makes migraines worse   Gold-Containing Drug  Products     HANDS ITCH   Nickel     HANDS SWELL   Citrus Rash   Medications Reviewed Today     Reviewed by Ward Chatters, PT (Physical Therapist) on 08/30/21 at 1158  Med List Status: <None>   Medication Order Taking? Sig Documenting Provider Last Dose Status Informant  acetaminophen (TYLENOL) 500 MG tablet [id number] No Take 1 tablet (500 mg total) by mouth every 6 (six) hours as needed. Jessy Oto, MD Taking Active   albuterol (PROVENTIL) (2.5 MG/3ML) 0.083% nebulizer solution [id number] No Take 3 mLs (2.5 mg total) by nebulization every 6 (six) hours as needed for wheezing or shortness of breath. [name], MD Taking Active Self  albuterol (VENTOLIN HFA) 108 (90 Base) MCG/ACT inhaler [id number] No Inhale 2 puffs into the lungs every 6 (six) hours as needed for wheezing or shortness of breath. [name], MD Taking Active Self  atorvastatin (LIPITOR) 20 MG tablet [id number] No TAKE ONE TABLET BY MOUTH ONCE DAILY AT BEDTIME [name], MD Taking Active Self  budesonide-formoterol [facility]) 160-4.5 MCG/ACT inhaler [id number]  Inhale 2 puffs into the lungs 2 (two) times daily. [name], MD  Active   buPROPion (WELLBUTRIN XL) 300 MG 24 hr tablet [id number] No Take 300 mg by mouth daily.  [provider] Taking Active Self  busPIRone (BUSPAR) 10 MG tablet [id number] No Take 10 mg by mouth 2 (two) times daily. [provider] Taking Active Self           Med Note [name], [facility]   Wed Feb 18, 2018 10:03 PM)    Cholecalciferol (VITAMIN D3) 10 MCG (  400 UNIT) CAPS [id number] No TAKE 1 CAPSULE (400 UNITS TOTAL) BY MOUTH DAILY (AM) [name], MD Taking Active Self  citalopram (CELEXA) 40 MG tablet [id number] No Take 40 mg by mouth daily.  [provider] Taking Active Self  cyclobenzaprine (FLEXERIL) 10 MG tablet [id number] No TAKE 1 TABLET (10 MG TOTAL) BY MOUTH 3 (THREE) TIMES DAILY AS NEEDED FOR MUSCLE SPASMS. [name], MD Taking Active Self  doxepin (SINEQUAN) 50 MG capsule [id number] No Take 150 mg by mouth at bedtime. [provider] Taking Active Self  ELIQUIS 5 MG TABS tablet [id number] No TAKE ONE TABLET BY MOUTH TWICE DAILY MORNING AND [name], MD Taking Active Self  Erenumab-aooe (AIMOVIG) 140 MG/ML SOAJ [id number] No Inject 140 mg into the skin every 28 (twenty-eight) days. [name], DO Taking Active Self  gabapentin (NEURONTIN) 300 MG capsule [id number] No TAKE ONE CAPSULE BY MOUTH THREE TIMES DAILY (AM+NOON+BEDTIME) [name], MD Taking Active   hydrochlorothiazide (MICROZIDE) 12.5 MG capsule [id number] No TAKE ONE CAPSULE BY MOUTH ONCE DAILY(AM) [name], MD Taking Active Self  metoprolol tartrate (LOPRESSOR) 50 MG tablet [id number] No TAKE ONE TABLET BY MOUTH TWICE DAILY (AM+BEDTIME) [name], MD Taking Active Self  montelukast (SINGULAIR) 10 MG tablet [id number] No TAKE 1 TABLET BY MOUTH EVERY EVENING AT BEDTIME [name], MD Taking Active Self  prazosin (MINIPRESS) 2 MG capsule [id number] No Take 6 mg by mouth at bedtime. [provider] Taking Active Self  sodium chloride (OCEAN) 0.65 % SOLN nasal spray [id number] No Place 1 spray into both nostrils as needed for congestion. [name], MD Taking Active Self  SYNTHROID 200 MCG tablet [id number] No Take 200 mcg by mouth daily before breakfast. [provider] Taking Active Self  topiramate (TOPAMAX) 200 MG tablet [id number] No TAKE 1 TABLET (200 MG TOTAL) BY MOUTH AT BEDTIME. [name], DO Taking Active Self  triamcinolone cream (KENALOG) 0.1 % [id number] No Apply 1 application topically 2 (two) times daily. [name], MD Taking Active Self  TRULICITY 4.19 FX/9.0WI Bonney Aid [id number] No Inject 1.5 mg into the skin once a week. [provider] Taking Active Self           Med Note ([place], [place] Peaks   Fri May 11, 2021  7:06 PM) Take on  Wednesday           Patient Active Problem List   Diagnosis Date Noted   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   Abdominal pannus 08/21/2021   Aortic atherosclerosis ([place]) 08/21/2021   At high risk for falls 04/20/2020   Hyperlipidemia 02/16/2020   Adrenal mass, right ([place]) 04/29/2019   Chronic cough 03/12/2018   Left shoulder pain 10/14/2016   Hypertension 10/14/2016   S/P thyroidectomy 06/27/2016   Chronic bilateral low back pain without sciatica 04/22/2016   Liver lesion 12/24/2015   Controlled type 2 diabetes mellitus with complication, with long-term current use of insulin ([place]) 10/10/2015   Vision loss 08/25/2015   Decreased vision in both eyes 08/25/2015   Prolonged Q-T interval on ECG 08/19/2015   History of pulmonary embolism 08/10/2015   Mild persistent asthma 04/13/2015   Fibroid, uterine    Morbid obesity ([place]) 11/11/2014   Atypical chest pain 11/08/2014   Sinus tachycardia 11/08/2014   Migraine 10/14/2014   Menorrhagia 09/08/2014   Conditions to be addressed/monitored per PCP order:  Chronic healthcare management needs, DM, asthma, HTN, migraines,  anxiety/depression, chronic pain, HLD  Care Plan : General Plan of Care (Adult)  Updates made by Gayla Medicus, RN since 08/31/2021 12:00 AM     Problem: Health Promotion or Disease Self-Management (General Plan of Care)   Priority: Medium  Onset Date: 10/20/2020     Long-Range Goal: Self-Management Plan Developed   Start Date: 07/14/2020  Expected End Date: 10/03/2021  Recent Progress: On track  Priority: Medium  Note:    Current Barriers:  Chronic Disease Management support and education needs Patient with anxiety and depression-has appointment with Psychiatrist  scheduled every 3 months, therapist every 2 weeks 08/31/21:  patient without complaint today, has started PT twice a week.   Nurse Case Manager Clinical Goal(s):  Over the next 90 days, patient will attend all scheduled medical appointments:   Over the next 30 days, patient will work with CM team pharmacist to review medications. Interventions:  Inter-disciplinary care team collaboration (see longitudinal plan of care) Evaluation of current treatment plan  and patient's adherence to plan as established by provider. Reviewed medications with patient. Collaborated with pharmacy regarding medications. Discussed plans with patient for ongoing care management follow up and provided patient with direct contact information for care management team Reviewed scheduled/upcoming provider appointments. Collaborated with SW for psychotherapy referral. SW referral for psychotherapy provider-completed Pharmacy referral for medication review. Self Care Activities: Over the next 90 days, patient will:  -Self administers medications as prescribed Attends all scheduled provider appointments Calls pharmacy for medication refills Calls provider office for new concerns or questions  Follow Up Plan: The Managed Medicaid care management team will reach out to the patient again over the next 30 days.  The patient has been provided with contact information for the Managed Medicaid care management team and has been advised to call with any health related questions or concerns.     Follow Up:  Patient agrees to Care Plan and Follow-up.  Plan: The Managed Medicaid care management team will reach out to the patient again over the next 30 days. and The  Patient has been provided with contact information for the Managed Medicaid care management team and has been advised to call with any health related questions or concerns.  Date/time of next scheduled RN care management/care coordination outreach:  10/01/21 at 230

## 2021-09-04 ENCOUNTER — Ambulatory Visit: Payer: Medicaid Other

## 2021-09-04 ENCOUNTER — Other Ambulatory Visit: Payer: Self-pay

## 2021-09-04 DIAGNOSIS — R262 Difficulty in walking, not elsewhere classified: Secondary | ICD-10-CM

## 2021-09-04 DIAGNOSIS — M6281 Muscle weakness (generalized): Secondary | ICD-10-CM | POA: Diagnosis not present

## 2021-09-04 NOTE — Therapy (Signed)
OUTPATIENT PHYSICAL THERAPY TREATMENT NOTE   Patient Name: Melinda [name] MRN: [id number] DOB:1965-07-30, 56 y.o., female Today's Date: 09/04/2021  PCP: [name], MD REFERRING PROVIDER: [name], MD   PT End of Session - 09/04/21 1214     Visit Number 4    Number of Visits 17    Date for PT Re-Evaluation 10/01/21    Authorization Type [employer] MCD    PT Start Time 1215    PT Stop Time 1257    PT Time Calculation (min) 42 min    Activity Tolerance Patient limited by pain    Behavior During Therapy WFL for tasks assessed/performed               Past Medical History:  Diagnosis Date   ADHD (attention deficit hyperactivity disorder)    Arthritis    Asthma    Cancer ([place])    Thyroid   Chicken pox AGE 60   COPD (chronic obstructive pulmonary disease) ([place])    pt reported   Diabetes mellitus without complication ([place])    Diverticulosis, sigmoid    GERD (gastroesophageal reflux disease)    Glaucoma    BOTH EYES, NO EYE DROPS   Glaucoma    History of blood transfusion 08/2014    2UNITS GIVEN AND IRON GIVEN   Hyperglycemia 09/09/2014   Hypertension    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 ([place]) AGE 65   HAD TO LEARN TO WALK AGAIN   Iron deficiency anemia due to chronic blood loss 09/09/2014   Low TSH level 09/08/2014   Lumbar herniated disc    Menorrhagia 09/08/2014   Migraine    CLUSTER AND MIGRAINES   Multiple thyroid nodules    Ovarian mass, right 09/09/2014   PE (pulmonary embolism) 2016   Peripheral neuropathy    FINGER TIPS AND TOES NUMB SOME   Preeclampsia 1983   WITH PREGNANCY   PTSD (post-traumatic stress disorder)    Scoliosis    Seizures ([place]) AGE 60   NONE SINCE, HAD CHICKEN POX THEN   Past Surgical History:  Procedure Laterality Date   ANGIOGRAM TO LEG  08-13-15   RIGHT   CHOLECYSTECTOMY     IR GENERIC HISTORICAL  04/04/2016   IR RADIOLOGIST EVAL & MGMT 04/04/2016 [name], MD [facility] INTERV RAD    RADIOLOGY WITH ANESTHESIA N/A 07/17/2021   Procedure: MRI LUMBAR SPINE WITHOUT CONTRAST; MRI CERVICAL SPINE WITHOUT CONTRAST WITH ANESTHESIA;  Surgeon: Radiologist, Medication, MD;  Location: [facility] OR;  Service: Radiology;  Laterality: N/A;   THYROIDECTOMY N/A 11/17/2015   Procedure: TOTAL THYROIDECTOMY;  Surgeon: Armandina Gemma, MD;  Location: [facility] ORS;  Service: General;  Laterality: N/A;   UTERINE ARTERY EMBOLIZATION Bilateral 08/13/2015   Patient Active Problem List   Diagnosis Date Noted   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   Abdominal pannus 08/21/2021   Aortic atherosclerosis ([place]) 08/21/2021   At high risk for falls 04/20/2020   Hyperlipidemia 02/16/2020   Adrenal mass, right ([place]) 04/29/2019   Chronic cough 03/12/2018   Left shoulder pain 10/14/2016   Hypertension 10/14/2016   S/P thyroidectomy 06/27/2016   Chronic bilateral low back pain without sciatica 04/22/2016   Liver lesion 12/24/2015   Controlled type 2 diabetes mellitus with complication, with long-term current use of insulin ([place]) 10/10/2015   Vision loss 08/25/2015   Decreased vision in both eyes 08/25/2015   Prolonged Q-T interval on ECG 08/19/2015   History of pulmonary embolism 08/10/2015  Mild persistent asthma 04/13/2015   Fibroid, uterine    Morbid obesity ([place]) 11/11/2014   Atypical chest pain 11/08/2014   Sinus tachycardia 11/08/2014   Migraine 10/14/2014   Menorrhagia 09/08/2014    REFERRING DIAG:  M48.02 (ICD-10-CM) - Spinal stenosis of cervical region M47.26 (ICD-10-CM) - Other spondylosis with radiculopathy, lumbar region M50.30 (ICD-10-CM) - Degenerative disc disease, cervical M47.812 (ICD-10-CM) - Spondylosis of cervical spine M77.8 (ICD-10-CM) - Tendinitis of shoulder, unspecified laterality  THERAPY DIAG:  Muscle weakness (generalized)  Difficulty in walking, not elsewhere classified  PERTINENT HISTORY:  HTN, peripheral neuropathy, DM II, PE, thyroid cancer, COPD  PRECAUTIONS:  Fall  SUBJECTIVE:  Pt presents to PT with continued reports of back and knee pain. She states she has been fairly compliant with HEP with no adverse effect. Pt is ready to begin PT at this time.   Pain: Are you having pain? Yes NPRS: 4/10 bilateral knees; 8/10 lower back Pain location: bilateral knees PAIN TYPE: sharp Pain description: intermittent  Aggravating factors: prolonged standing, walking Relieving factors: rest    OBJECTIVE:    PATIENT SURVEYS:  Modified Oswestry 86% disability  Quick Dash 87% disability    CERVICAL AROM/PROM   A/PROM A/PROM (deg) 08/06/2021  Right rotation 43 p!  Left rotation 35   (Blank rows = not tested)   UE AROM/PROM:   A/PROM Right 08/06/2021 Left 08/06/2021  Shoulder flexion 82 WFL  Shoulder extension      Shoulder abduction 75 WFL  Shoulder adduction      Shoulder extension      Shoulder internal rotation 15 WFL  Shoulder external rotation 20 WFL  Elbow flexion      Elbow extension      Wrist flexion      Wrist extension      Wrist ulnar deviation      Wrist radial deviation      Wrist pronation      Wrist supination       (Blank rows = not tested)   UE MMT:   MMT Right 08/06/2021 Left 08/06/2021  Shoulder flexion 2+/5 3+/5  Shoulder abduction 2+/5 3+/5  Shoulder internal rotation 3/5 3+/5  Shoulder external rotation 35 3+/5  Grip strength 55# 54#   (Blank rows = not tested)   LE MMT:   MMT Right 08/06/2021 Left 08/06/2021  Hip flexion 3+/5 2+/5  Hip abduction 3+/5 2+/5  Hip adduction 3+/5 3/5  Knee flexion 4/5 3/5  Knee extension 4/5 3/5   (Blank rows = not tested)   Edema:          2+ pitting edema L LE   FUNCTIONAL TESTS:  30 Second Sit to Stand: 2 reps     TODAY'S TREATMENT:  [facility] Adult PT Treatment:                                                DATE: 09/04/2021 Therapeutic Exercise: Seated clamshell black TB 2x20 Seated march 2x20 GTB Seated hamstring curl 2x10 black TB LAQ 2x10 4# Seated horizontal  abd 3x10 GTB Seated Row 2x10 Black TB Seated bilateral ER YTB 3x10 STS 2x5 - with UE support and elevated table Standing hip abd 3x5 each - with UE support Standing hip ext 2x5 each - with UE support  [facility] Adult PT Treatment:  DATE: 08/30/2021 Therapeutic Exercise: Seated clamshell GTB 3x15 Seated march 2x20 GTB Seated horizontal abd 3x20 GTB Seated Row 2x10 BTB Seated LAQ 3x10 3# each Seated bilateral ER YTB 3x10 STS x 5 - with UE support and elevated table  [facility] Adult PT Treatment:                                                DATE: 08/23/2021 Therapeutic Exercise: NuStep lvl 5 UE/LE x 3 min while taking subjective Seated clamshell GTB 3x15 Seated march 2x20 GTB Seated horizontal abd 2x10 RTB Seated Row 2x12 GTB Seated LAQ 2x10 2.5# each STS 2x5 - with UE support and elevated table   PATIENT EDUCATION:  Education details: continue HEP Person educated: Patient Education method: Explanation, Demonstration, and Handouts Education comprehension: verbalized understanding and returned demonstration   HOME EXERCISE PROGRAM: Access Code: [email or web address] URL: [email or web address] Date: 08/23/2021 Prepared by: Octavio Manns  Exercises Standing Shoulder Row with Anchored Resistance - 1 x daily - 7 x weekly - 3 sets - 10 reps Seated Hip Abduction with Resistance - 1 x daily - 7 x weekly - 3 sets - 10 reps Seated March with Resistance - 1 x daily - 7 x weekly - 2 sets - 20 reps Seated Long Arc Quad - 1 x daily - 7 x weekly - 3 sets - 10 reps Seated Shoulder Horizontal Abduction with Resistance - Palms Down - 1 x daily - 7 x weekly - 2 sets - 10 reps     ASSESSMENT: Pt was again able to complete all prescribed exercises with on adverse effect or increase in pain. Therapy today focused on continued on LE and UE strengthening to decrease pain and improve mobility. She was again able to progress today, initiating some standing  proximal hip strengthening exercises. Pt continues to benefit from skilled PT services and will continue to be seen and progressed as able.  Problem List: Abnormal gait, decreased activity tolerance, decreased balance, decreased endurance, decreased mobility, difficulty walking, decreased ROM, decreased strength, obesity, and pain     GOALS: Goals reviewed with patient? No   SHORT TERM GOALS:   STG Name Target Date Goal status  1 Pt will be compliant and knowledgeable with initial HEP for improved comfort and carryover Baseline: initial HEP given 08/27/2021 MET  2 Pt will self report low back and neck/R shoulder pain no greater than 6/10 for improved comfort and functional ability Baseline: 10/10 at worst 08/27/2021 ONGOING    LONG TERM GOALS:    LTG Name Target Date Goal status  1 Pt will self report low back and neck/R shoulder pain no greater than 6/10 for improved comfort and functional ability Baseline: 10/10 at worst 10/01/2021 INITIAL  2 Pt will decrease Quick DASH disability score to no greater than 65% as proxy for functional improvement Baseline: 87% disability 10/01/2021 INITIAL  3 Pt will decrease ODI disability score to no greater than 70% as proxy for functional improvement Baseline: 86% disability 10/01/2021 INITIAL  4 Pt will increase reps in 30 Second Sit to Stand to no less than 5 reps for improved strength and functional mobility Baseline: 2 reps with UE support 10/01/2021 INITIAL  5 Pt will increase R shoulder flex/abd AROM to no less than 125 deg for improved functional ability with home ADLs Baseline: see chart 10/01/2021 INITIAL    PLAN: PT  FREQUENCY: 2x/week   PT DURATION: 8 weeks   PLANNED INTERVENTIONS: Therapeutic exercises, Therapeutic activity, Neuro Muscular re-education, Balance training, Gait training, Patient/Family education, Joint mobilization, Aquatic Therapy, Cryotherapy, Moist heat, and Manual therapy   PLAN FOR NEXT SESSION: assess HEP response;  progress LE and periscapular strengthening as able   Check all possible CPT codes: 74944- Therapeutic Exercise, [phone]- Neuro Re-education, [phone] - Gait Training, [phone] - Manual Therapy, [phone] - Therapeutic Activities, and 97535 - [place], PT 09/04/2021, 12:58 PM

## 2021-09-06 ENCOUNTER — Ambulatory Visit: Payer: Medicaid Other

## 2021-09-06 ENCOUNTER — Other Ambulatory Visit: Payer: Self-pay

## 2021-09-06 DIAGNOSIS — M6281 Muscle weakness (generalized): Secondary | ICD-10-CM

## 2021-09-06 DIAGNOSIS — R262 Difficulty in walking, not elsewhere classified: Secondary | ICD-10-CM

## 2021-09-06 NOTE — Therapy (Signed)
OUTPATIENT PHYSICAL THERAPY TREATMENT NOTE   Patient Name: Melinda [name] MRN: [id number] DOB:Nov 27, 1965, 56 y.o., female Today's Date: 09/06/2021  PCP: [name], MD REFERRING PROVIDER: Jessy Oto, MD   PT End of Session - 09/06/21 1130     Visit Number 5    Number of Visits 17    Date for PT Re-Evaluation 10/01/21    Authorization Type U[employer] MCD    PT Start Time 1443    PT Stop Time 1210    PT Time Calculation (min) 39 min    Activity Tolerance Patient limited by pain    Behavior During Therapy WFL for tasks assessed/performed                Past Medical History:  Diagnosis Date   ADHD (attention deficit hyperactivity disorder)    Arthritis    Asthma    Cancer ([place])    Thyroid   Chicken pox AGE 40   COPD (chronic obstructive pulmonary disease) ([place])    pt reported   Diabetes mellitus without complication ([place])    Diverticulosis, sigmoid    GERD (gastroesophageal reflux disease)    Glaucoma    BOTH EYES, NO EYE DROPS   Glaucoma    History of blood transfusion 08/2014    2UNITS GIVEN AND IRON GIVEN   Hyperglycemia 09/09/2014   Hypertension    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 ([place]) AGE 83   HAD TO LEARN TO WALK AGAIN   Iron deficiency anemia due to chronic blood loss 09/09/2014   Low TSH level 09/08/2014   Lumbar herniated disc    Menorrhagia 09/08/2014   Migraine    CLUSTER AND MIGRAINES   Multiple thyroid nodules    Ovarian mass, right 09/09/2014   PE (pulmonary embolism) 2016   Peripheral neuropathy    FINGER TIPS AND TOES NUMB SOME   Preeclampsia 1983   WITH PREGNANCY   PTSD (post-traumatic stress disorder)    Scoliosis    Seizures ([place]) AGE 40   NONE SINCE, HAD CHICKEN POX THEN   Past Surgical History:  Procedure Laterality Date   ANGIOGRAM TO LEG  08-13-15   RIGHT   CHOLECYSTECTOMY     IR GENERIC HISTORICAL  04/04/2016   IR RADIOLOGIST EVAL & MGMT 04/04/2016 [name], MD [facility] INTERV RAD    RADIOLOGY WITH ANESTHESIA N/A 07/17/2021   Procedure: MRI LUMBAR SPINE WITHOUT CONTRAST; MRI CERVICAL SPINE WITHOUT CONTRAST WITH ANESTHESIA;  Surgeon: Radiologist, Medication, MD;  Location: [facility] OR;  Service: Radiology;  Laterality: N/A;   THYROIDECTOMY N/A 11/17/2015   Procedure: TOTAL THYROIDECTOMY;  Surgeon: Armandina Gemma, MD;  Location: [facility];  Service: General;  Laterality: N/A;   UTERINE ARTERY EMBOLIZATION Bilateral 08/13/2015   Patient Active Problem List   Diagnosis Date Noted   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   Abdominal pannus 08/21/2021   Aortic atherosclerosis ([place]) 08/21/2021   At high risk for falls 04/20/2020   Hyperlipidemia 02/16/2020   Adrenal mass, right ([place]) 04/29/2019   Chronic cough 03/12/2018   Left shoulder pain 10/14/2016   Hypertension 10/14/2016   S/P thyroidectomy 06/27/2016   Chronic bilateral low back pain without sciatica 04/22/2016   Liver lesion 12/24/2015   Controlled type 2 diabetes mellitus with complication, with long-term current use of insulin ([place]) 10/10/2015   Vision loss 08/25/2015   Decreased vision in both eyes 08/25/2015   Prolonged Q-T interval on ECG 08/19/2015   History of pulmonary embolism  08/10/2015   Mild persistent asthma 04/13/2015   Fibroid, uterine    Morbid obesity ([place]) 11/11/2014   Atypical chest pain 11/08/2014   Sinus tachycardia 11/08/2014   Migraine 10/14/2014   Menorrhagia 09/08/2014    REFERRING DIAG:  M48.02 (ICD-10-CM) - Spinal stenosis of cervical region M47.26 (ICD-10-CM) - Other spondylosis with radiculopathy, lumbar region M50.30 (ICD-10-CM) - Degenerative disc disease, cervical M47.812 (ICD-10-CM) - Spondylosis of cervical spine M77.8 (ICD-10-CM) - Tendinitis of shoulder, unspecified laterality  THERAPY DIAG:  Muscle weakness (generalized)  Difficulty in walking, not elsewhere classified  PERTINENT HISTORY:  HTN, peripheral neuropathy, DM II, PE, thyroid cancer, COPD  PRECAUTIONS:  Fall  SUBJECTIVE:  Pt presents to PT s/p fall while in the kitchen the previous day due to dizziness and lightheadedness. She notes that she has a cut on her upper back that was bleeding a good bit yesterday and she would like PT to dress if possible. Pt is ready to begin PT treatment at this time.   Pain: Are you having pain? Yes NPRS: 4/10 bilateral knees; 8/10 lower back Pain location: bilateral knees PAIN TYPE: sharp Pain description: intermittent  Aggravating factors: prolonged standing, walking Relieving factors: rest   OBJECTIVE:    PATIENT SURVEYS:  Modified Oswestry 86% disability  Quick Dash 87% disability    CERVICAL AROM/PROM   A/PROM A/PROM (deg) 08/06/2021  Right rotation 43 p!  Left rotation 35   (Blank rows = not tested)   UE AROM/PROM:   A/PROM Right 08/06/2021 Left 08/06/2021  Shoulder flexion 82 WFL  Shoulder extension      Shoulder abduction 75 WFL  Shoulder adduction      Shoulder extension      Shoulder internal rotation 15 WFL  Shoulder external rotation 20 WFL  Elbow flexion      Elbow extension      Wrist flexion      Wrist extension      Wrist ulnar deviation      Wrist radial deviation      Wrist pronation      Wrist supination       (Blank rows = not tested)   UE MMT:   MMT Right 08/06/2021 Left 08/06/2021  Shoulder flexion 2+/5 3+/5  Shoulder abduction 2+/5 3+/5  Shoulder internal rotation 3/5 3+/5  Shoulder external rotation 35 3+/5  Grip strength 55# 54#   (Blank rows = not tested)   LE MMT:   MMT Right 08/06/2021 Left 08/06/2021  Hip flexion 3+/5 2+/5  Hip abduction 3+/5 2+/5  Hip adduction 3+/5 3/5  Knee flexion 4/5 3/5  Knee extension 4/5 3/5   (Blank rows = not tested)   Orthostatics:         Sitting: 114/88            Standing: 114/84    FUNCTIONAL TESTS:  30 Second Sit to Stand: 2 reps     TODAY'S TREATMENT:  [facility] Adult PT Treatment:                                                DATE: 09/06/2021 Therapeutic  Exercise: LAQ 2x15 4# Seated clamshell 2x20 Black TB Seated march 2x20 Black TB Seated hamstring curl 2x10 black TB Therapeutic Activity: Cleaning and dress of small cut/wound on pt's lower back Education on wound observation Assessment of orthostatics for possible BP  contribution to dizziness  [facility] Adult PT Treatment:                                                DATE: 08/30/2021 Therapeutic Exercise: Seated clamshell GTB 3x15 Seated march 2x20 GTB Seated horizontal abd 3x20 GTB Seated Row 2x10 BTB Seated LAQ 3x10 3# each Seated bilateral ER YTB 3x10 STS x 5 - with UE support and elevated table  [facility] Adult PT Treatment:                                                DATE: 08/23/2021 Therapeutic Exercise: NuStep lvl 5 UE/LE x 3 min while taking subjective Seated clamshell GTB 3x15 Seated march 2x20 GTB Seated horizontal abd 2x10 RTB Seated Row 2x12 GTB Seated LAQ 2x10 2.5# each STS 2x5 - with UE support and elevated table   PATIENT EDUCATION:  Education details: continue HEP Person educated: Patient Education method: Explanation, Demonstration, and Handouts Education comprehension: verbalized understanding and returned demonstration   HOME EXERCISE PROGRAM: Access Code: [email or web address] URL: [email or web address] Date: 08/23/2021 Prepared by: Octavio Manns  Exercises Standing Shoulder Row with Anchored Resistance - 1 x daily - 7 x weekly - 3 sets - 10 reps Seated Hip Abduction with Resistance - 1 x daily - 7 x weekly - 3 sets - 10 reps Seated March with Resistance - 1 x daily - 7 x weekly - 2 sets - 20 reps Seated Long Arc Quad - 1 x daily - 7 x weekly - 3 sets - 10 reps Seated Shoulder Horizontal Abduction with Resistance - Palms Down - 1 x daily - 7 x weekly - 2 sets - 10 reps     ASSESSMENT: Pt tolerated seated LE strengthening exercises well with no adverse effect. Measure BP for orthostatics did not reveal drop in pressure or symptoms of dizziness. PT  educated pt on observation of small cut/abrasion, as well as continuing to monitor BP with home device. Will continue to progress pt per POC as prescribed.   Problem List: Abnormal gait, decreased activity tolerance, decreased balance, decreased endurance, decreased mobility, difficulty walking, decreased ROM, decreased strength, obesity, and pain     GOALS: Goals reviewed with patient? No   SHORT TERM GOALS:   STG Name Target Date Goal status  1 Pt will be compliant and knowledgeable with initial HEP for improved comfort and carryover Baseline: initial HEP given 08/27/2021 MET  2 Pt will self report low back and neck/R shoulder pain no greater than 6/10 for improved comfort and functional ability Baseline: 10/10 at worst 08/27/2021 ONGOING    LONG TERM GOALS:    LTG Name Target Date Goal status  1 Pt will self report low back and neck/R shoulder pain no greater than 6/10 for improved comfort and functional ability Baseline: 10/10 at worst 10/01/2021 INITIAL  2 Pt will decrease Quick DASH disability score to no greater than 65% as proxy for functional improvement Baseline: 87% disability 10/01/2021 INITIAL  3 Pt will decrease ODI disability score to no greater than 70% as proxy for functional improvement Baseline: 86% disability 10/01/2021 INITIAL  4 Pt will increase reps in 30 Second Sit to Stand to no less than 5 reps for improved strength  and functional mobility Baseline: 2 reps with UE support 10/01/2021 INITIAL  5 Pt will increase R shoulder flex/abd AROM to no less than 125 deg for improved functional ability with home ADLs Baseline: see chart 10/01/2021 INITIAL    PLAN: PT FREQUENCY: 2x/week   PT DURATION: 8 weeks   PLANNED INTERVENTIONS: Therapeutic exercises, Therapeutic activity, Neuro Muscular re-education, Balance training, Gait training, Patient/Family education, Joint mobilization, Aquatic Therapy, Cryotherapy, Moist heat, and Manual therapy   PLAN FOR NEXT SESSION: assess HEP  response; progress LE and periscapular strengthening as able   Check all possible CPT codes: 97110- Therapeutic Exercise, [phone]- Neuro Re-education, [phone] - Gait Training, [phone] - Manual Therapy, [phone] - Therapeutic Activities, and 97535 - [place], PT 09/06/2021, 1:34 PM

## 2021-09-07 ENCOUNTER — Ambulatory Visit: Payer: Medicaid Other

## 2021-09-10 DIAGNOSIS — F411 Generalized anxiety disorder: Secondary | ICD-10-CM | POA: Diagnosis not present

## 2021-09-10 DIAGNOSIS — F331 Major depressive disorder, recurrent, moderate: Secondary | ICD-10-CM | POA: Diagnosis not present

## 2021-09-10 DIAGNOSIS — F4310 Post-traumatic stress disorder, unspecified: Secondary | ICD-10-CM | POA: Diagnosis not present

## 2021-09-12 ENCOUNTER — Ambulatory Visit: Payer: Medicaid Other

## 2021-09-12 ENCOUNTER — Other Ambulatory Visit: Payer: Self-pay

## 2021-09-12 DIAGNOSIS — M6281 Muscle weakness (generalized): Secondary | ICD-10-CM | POA: Diagnosis not present

## 2021-09-12 DIAGNOSIS — R262 Difficulty in walking, not elsewhere classified: Secondary | ICD-10-CM

## 2021-09-12 NOTE — Therapy (Signed)
?OUTPATIENT PHYSICAL THERAPY TREATMENT NOTE ? ? ?Patient Name: Melinda [name] ?MRN: [id number] ?DOB:1966/06/04, 56 y.o., female ?Today's Date: 09/12/2021 ? ?PCP: [name], MD ?REFERRING PROVIDER: Jessy Oto, MD ? ? PT End of Session - 09/12/21 1222   ? ? Visit Number 6   ? Number of Visits 17   ? Date for PT Re-Evaluation 10/01/21   ? Authorization Type UHC MCD   ? PT Start Time 1222   ? PT Stop Time 1302   ? PT Time Calculation (min) 40 min   ? Activity Tolerance Patient limited by pain;Patient tolerated treatment well   ? Behavior During Therapy [facility] for tasks assessed/performed   ? ?  ?  ? ?  ? ? ? ? ? ? ?Past Medical History:  ?Diagnosis Date  ? ADHD (attention deficit hyperactivity disorder)   ? Arthritis   ? Asthma   ? Cancer [facility])   ? Thyroid  ? Chicken pox AGE 75  ? COPD (chronic obstructive pulmonary disease) ([place])   ? pt reported  ? Diabetes mellitus without complication ([place])   ? Diverticulosis, sigmoid   ? GERD (gastroesophageal reflux disease)   ? Glaucoma   ? BOTH EYES, NO EYE DROPS  ? Glaucoma   ? History of blood transfusion 08/2014  ?  2UNITS GIVEN AND IRON GIVEN  ? Hyperglycemia 09/09/2014  ? Hypertension   ? 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 HAD TO LEARN TO WALK AGAIN  ? Iron deficiency anemia due to chronic blood loss 09/09/2014  ? Low TSH level 09/08/2014  ? Lumbar herniated disc   ? Menorrhagia 09/08/2014  ? Migraine   ? CLUSTER AND MIGRAINES  ? Multiple thyroid nodules   ? Ovarian mass, right 09/09/2014  ? PE (pulmonary embolism) 2016  ? Peripheral neuropathy   ? FINGER TIPS AND TOES NUMB SOME  ? Preeclampsia 1983  ? WITH PREGNANCY  ? PTSD (post-traumatic stress disorder)   ? Scoliosis   ? Seizures ([place]) AGE 60  ? NONE SINCE, HAD CHICKEN POX THEN  ? ?Past Surgical History:  ?Procedure Laterality Date  ? ANGIOGRAM TO LEG  08-13-15  ? RIGHT  ? CHOLECYSTECTOMY    ? IR GENERIC HISTORICAL  04/04/2016  ? IR RADIOLOGIST EVAL & MGMT 04/04/2016 [name], MD [facility] INTERV RAD  ? RADIOLOGY WITH ANESTHESIA N/A 07/17/2021  ? Procedure: MRI LUMBAR SPINE WITHOUT CONTRAST; MRI CERVICAL SPINE WITHOUT CONTRAST WITH ANESTHESIA;  Surgeon: Radiologist, Medication, MD;  Location: [place];  Service: Radiology;  Laterality: N/A;  ? THYROIDECTOMY N/A 11/17/2015  ? Procedure: TOTAL THYROIDECTOMY;  Surgeon: Armandina Gemma, MD;  Location[facility];  Service: General;  Laterality: N/A;  ? UTERINE ARTERY EMBOLIZATION Bilateral 08/13/2015  ? ?Patient Active Problem List  ? Diagnosis Date Noted  ? 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 Abdominal pannus 08/21/2021  ? Aortic atherosclerosis ([place]) 08/21/2021  ? At high risk for falls 04/20/2020  ? Hyperlipidemia 02/16/2020  ? Adrenal mass, right ([place]) 04/29/2019  ? Chronic cough 03/12/2018  ? Left shoulder pain 10/14/2016  ? Hypertension 10/14/2016  ? S/P thyroidectomy 06/27/2016  ? Chronic bilateral low back pain without sciatica 04/22/2016  ? Liver lesion 12/24/2015  ? Controlled type 2 diabetes mellitus with complication, with long-term current use of insulin ([place]) 10/10/2015  ? Vision loss 08/25/2015  ? Decreased vision in both eyes 08/25/2015  ? Prolonged Q-T interval on ECG 08/19/2015  ?  History of pulmonary embolism 08/10/2015  ? Mild persistent asthma 04/13/2015  ? Fibroid, uterine   ? Morbid obesity ([place]) 11/11/2014  ? Atypical chest pain 11/08/2014  ? Sinus tachycardia 11/08/2014  ? Migraine 10/14/2014  ? Menorrhagia 09/08/2014  ? ? ?REFERRING DIAG:  ?M48.02 (ICD-10-CM) - Spinal stenosis of cervical region ?M47.26 (ICD-10-CM) - Other spondylosis with radiculopathy, lumbar region ?M50.30 (ICD-10-CM) - Degenerative disc disease, cervical ?M47.812 (ICD-10-CM) - Spondylosis of cervical spine ?M77.8 (ICD-10-CM) - Tendinitis of shoulder, unspecified laterality ? ?THERAPY DIAG:  ?Muscle weakness (generalized) ? ?Difficulty in walking, not elsewhere classified ? ?PERTINENT HISTORY:  ?HTN, peripheral neuropathy, DM II, PE, thyroid  cancer, COPD ? ?PRECAUTIONS: Fall ? ?SUBJECTIVE: I'm having a bit of a migraine today. My left ankle hurts from when I fell. I had some sciatic pain last. I fell this morning when I slipped on a pair or pajama pants. ? ?Pain: ?Are you having pain? Yes ?NPRS: 5/10 bilateral knees ?Pain location: bilateral knees ?PAIN TYPE: sharp ?Pain description: intermittent  ?Aggravating factors: prolonged standing, walking ?Relieving factors: rest ? ? ?OBJECTIVE:  ?  ?PATIENT SURVEYS:  ?Modified Oswestry 86% disability  ?Quick Dash 87% disability ?  ? CERVICAL AROM/PROM ?  ?A/PROM A/PROM (deg) ?08/06/2021  ?Right rotation 43 p!  ?Left rotation 35  ? (Blank rows = not tested) ?  ?UE AROM/PROM: ?  ?A/PROM Right ?08/06/2021 Left ?08/06/2021  ?Shoulder flexion 82 WFL  ?Shoulder extension      ?Shoulder abduction 75 WFL  ?Shoulder adduction      ?Shoulder extension      ?Shoulder internal rotation 15 WFL  ?Shoulder external rotation 20 WFL  ?Elbow flexion      ?Elbow extension      ?Wrist flexion      ?Wrist extension      ?Wrist ulnar deviation      ?Wrist radial deviation      ?Wrist pronation      ?Wrist supination      ? (Blank rows = not tested) ?  ?UE MMT: ?  ?MMT Right ?08/06/2021 Left ?08/06/2021  ?Shoulder flexion 2+/5 3+/5  ?Shoulder abduction 2+/5 3+/5  ?Shoulder internal rotation 3/5 3+/5  ?Shoulder external rotation 35 3+/5  ?Grip strength 55# 54#  ? (Blank rows = not tested) ?  ?LE MMT: ?  ?MMT Right ?08/06/2021 Left ?08/06/2021  ?Hip flexion 3+/5 2+/5  ?Hip abduction 3+/5 2+/5  ?Hip adduction 3+/5 3/5  ?Knee flexion 4/5 3/5  ?Knee extension 4/5 3/5  ? (Blank rows = not tested) ?  ?Orthostatics: ?        Sitting: 114/88 ?           Standing: 114/84  ?  ?FUNCTIONAL TESTS:  ?30 Second Sit to Stand: 2 reps ?09/12/2021: 10 reps with UE support ?  ?  ?TODAY'S TREATMENT:  ?[facility] Adult PT Treatment:                                                DATE: 09/12/2021 ?Therapeutic Exercise: ?LAQ 2x15 4# ?Seated clamshell 2x20 Black TB ?Seated march  2x20 4# ?Seated hamstring curl 2x10 GTB ?Seated Row 2x10 BTB ?Seated horizontal abduction GTB 2x10 ?Seated bilateral ER GTB 3x10 ?STS x 10 - with UE support and elevated table ? ? ?[place] Adult PT Treatment:  DATE: 09/06/2021 ?Therapeutic Exercise: ?LAQ 2x15 4# ?Seated clamshell 2x20 Black TB ?Seated march 2x20 Black TB ?Seated hamstring curl 2x10 black TB ?Therapeutic Activity: ?Cleaning and dress of small cut/wound on pt's lower back ?Education on wound observation ?Assessment of orthostatics for possible BP contribution to dizziness ? ?[facility] Adult PT Treatment:                                                DATE: 08/30/2021 ?Therapeutic Exercise: ?Seated clamshell GTB 3x15 ?Seated march 2x20 GTB ?Seated horizontal abd 3x20 GTB ?Seated Row 2x10 BTB ?Seated LAQ 3x10 3# each ?Seated bilateral ER YTB 3x10 ?STS x 5 - with UE support and elevated table ? ?  ?PATIENT EDUCATION:  ?Education details: continue HEP ?Person educated: Patient ?Education method: Explanation, Demonstration, and Handouts ?Education comprehension: verbalized understanding and returned demonstration ?  ?HOME EXERCISE PROGRAM: ?Access Code: [email or web address] ?URL: [email or web address] ?Date: 08/23/2021 ?Prepared by: Octavio Manns ? ?Exercises ?Standing Shoulder Row with Anchored Resistance - 1 x daily - 7 x weekly - 3 sets - 10 reps ?Seated Hip Abduction with Resistance - 1 x daily - 7 x weekly - 3 sets - 10 reps ?Seated March with Resistance - 1 x daily - 7 x weekly - 2 sets - 20 reps ?Seated Long Arc Quad - 1 x daily - 7 x weekly - 3 sets - 10 reps ?Seated Shoulder Horizontal Abduction with Resistance - Palms Down - 1 x daily - 7 x weekly - 2 sets - 10 reps ?  ?  ?ASSESSMENT: ?Patient presents to PT with moderate levels of pain and states she fell this morning when she slipped on clothes on the ground. She reports she isn't having back pain today but her L ankle is sore from the fall. She has met her  STS goal with 10 repetitions today, utilizing her UE. She was able to complete all prescribed exercises with only reports of increased pain after upper extremity exercises, describing a tight feeling in h

## 2021-09-13 NOTE — Therapy (Signed)
?OUTPATIENT PHYSICAL THERAPY TREATMENT NOTE ? ? ?Patient Name: Melinda [name] ?MRN: [id number] ?DOB:10-Jun-1966, 56 y.o., female ?Today's Date: 09/14/2021 ? ?PCP: [name], MD ?REFERRING PROVIDER: [name], MD ? ? PT End of Session - 09/14/21 1042   ? ? Visit Number 7   ? Number of Visits 17   ? Date for PT Re-Evaluation 10/01/21   ? Authorization Type UHC MCD   ? PT Start Time 1045   ? PT Stop Time 1130   ? PT Time Calculation (min) 45 min   ? ?  ?  ? ?  ? ? ? ? ? ? ? ?Past Medical History:  ?Diagnosis Date  ? ADHD (attention deficit hyperactivity disorder)   ? Arthritis   ? Asthma   ? Cancer [facility])   ? Thyroid  ? Chicken pox AGE 56  ? COPD (chronic obstructive pulmonary disease) ([place])   ? pt reported  ? Diabetes mellitus without complication ([place])   ? Diverticulosis, sigmoid   ? GERD (gastroesophageal reflux disease)   ? Glaucoma   ? BOTH EYES, NO EYE DROPS  ? Glaucoma   ? History of blood transfusion 08/2014  ?  2UNITS GIVEN AND IRON GIVEN  ? Hyperglycemia 09/09/2014  ? Hypertension   ? 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 HAD TO LEARN TO WALK AGAIN  ? Iron deficiency anemia due to chronic blood loss 09/09/2014  ? Low TSH level 09/08/2014  ? Lumbar herniated disc   ? Menorrhagia 09/08/2014  ? Migraine   ? CLUSTER AND MIGRAINES  ? Multiple thyroid nodules   ? Ovarian mass, right 09/09/2014  ? PE (pulmonary embolism) 2016  ? Peripheral neuropathy   ? FINGER TIPS AND TOES NUMB SOME  ? Preeclampsia 1983  ? WITH PREGNANCY  ? PTSD (post-traumatic stress disorder)   ? Scoliosis   ? Seizures ([place]) AGE 23  ? NONE SINCE, HAD CHICKEN POX THEN  ? ?Past Surgical History:  ?Procedure Laterality Date  ? ANGIOGRAM TO LEG  08-13-15  ? RIGHT  ? CHOLECYSTECTOMY    ? IR GENERIC HISTORICAL  04/04/2016  ? IR RADIOLOGIST EVAL & MGMT 04/04/2016 [name], MD [facility] INTERV RAD  ? RADIOLOGY WITH ANESTHESIA N/A 07/17/2021  ? Procedure: MRI LUMBAR SPINE WITHOUT CONTRAST; MRI CERVICAL  SPINE WITHOUT CONTRAST WITH ANESTHESIA;  Surgeon: Radiologist, Medication, MD;  Location: [place];  Service: Radiology;  Laterality: N/A;  ? THYROIDECTOMY N/A 11/17/2015  ? Procedure: TOTAL THYROIDECTOMY;  Surgeon: Armandina Gemma, MD;  [facility];  Service: General;  Laterality: N/A;  ? UTERINE ARTERY EMBOLIZATION Bilateral 08/13/2015  ? ?Patient Active Problem List  ? Diagnosis Date Noted  ? 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 Abdominal pannus 08/21/2021  ? Aortic atherosclerosis ([place]) 08/21/2021  ? At high risk for falls 04/20/2020  ? Hyperlipidemia 02/16/2020  ? Adrenal mass, right ([place]) 04/29/2019  ? Chronic cough 03/12/2018  ? Left shoulder pain 10/14/2016  ? Hypertension 10/14/2016  ? S/P thyroidectomy 06/27/2016  ? Chronic bilateral low back pain without sciatica 04/22/2016  ? Liver lesion 12/24/2015  ? Controlled type 2 diabetes mellitus with complication, with long-term current use of insulin ([place]) 10/10/2015  ? Vision loss 08/25/2015  ? Decreased vision in both eyes 08/25/2015  ? Prolonged Q-T interval on ECG 08/19/2015  ? History of pulmonary embolism 08/10/2015  ? Mild persistent asthma 04/13/2015  ? Fibroid, uterine   ? Morbid obesity ([place])  11/11/2014  ? Atypical chest pain 11/08/2014  ? Sinus tachycardia 11/08/2014  ? Migraine 10/14/2014  ? Menorrhagia 09/08/2014  ? ? ?REFERRING DIAG:  ?M48.02 (ICD-10-CM) - Spinal stenosis of cervical region ?M47.26 (ICD-10-CM) - Other spondylosis with radiculopathy, lumbar region ?M50.30 (ICD-10-CM) - Degenerative disc disease, cervical ?M47.812 (ICD-10-CM) - Spondylosis of cervical spine ?M77.8 (ICD-10-CM) - Tendinitis of shoulder, unspecified laterality ? ?THERAPY DIAG:  ?Muscle weakness (generalized) ? ?Difficulty in walking, not elsewhere classified ? ?PERTINENT HISTORY:  ?HTN, peripheral neuropathy, DM II, PE, thyroid cancer, COPD ? ?PRECAUTIONS: Fall ? ?SUBJECTIVE: I'm sleepy today, my pain isn't too bad right now. I'm having a bit of a migraine  today.  ? ?Pain: ?Are you having pain? Yes ?NPRS: 5/10 bilateral knees, L ankle, neck, shoulders ?Pain location: bilateral knees ?PAIN TYPE: sharp ?Pain description: intermittent  ?Aggravating factors: prolonged standing, walking ?Relieving factors: rest ? ? ?OBJECTIVE:  ?  ?PATIENT SURVEYS:  ?Modified Oswestry 86% disability  ?Quick Dash 87% disability ?  ? CERVICAL AROM/PROM ?  ?A/PROM A/PROM (deg) ?08/06/2021  ?Right rotation 43 p!  ?Left rotation 35  ? (Blank rows = not tested) ?  ?UE AROM/PROM: ?  ?A/PROM Right ?08/06/2021 Left ?08/06/2021  ?Shoulder flexion 82 WFL  ?Shoulder extension      ?Shoulder abduction 75 WFL  ?Shoulder adduction      ?Shoulder extension      ?Shoulder internal rotation 15 WFL  ?Shoulder external rotation 20 WFL  ?Elbow flexion      ?Elbow extension      ?Wrist flexion      ?Wrist extension      ?Wrist ulnar deviation      ?Wrist radial deviation      ?Wrist pronation      ?Wrist supination      ? (Blank rows = not tested) ?  ?UE MMT: ?  ?MMT Right ?08/06/2021 Left ?08/06/2021  ?Shoulder flexion 2+/5 3+/5  ?Shoulder abduction 2+/5 3+/5  ?Shoulder internal rotation 3/5 3+/5  ?Shoulder external rotation 35 3+/5  ?Grip strength 55# 54#  ? (Blank rows = not tested) ?  ?LE MMT: ?  ?MMT Right ?08/06/2021 Left ?08/06/2021 Right ?09/14/2021 Left ?09/14/2021  ?Hip flexion 3+/5 2+/5 4/5 3/5  ?Hip abduction 3+/5 2+/5    ?Hip adduction 3+/5 3/5    ?Knee flexion 4/5 3/5 4+/5 3+/5  ?Knee extension 4/5 3/5 4/5 3+/5  ? (Blank rows = not tested) ?  ?Orthostatics: ?        Sitting: 114/88 ?           Standing: 114/84  ?  ?FUNCTIONAL TESTS:  ?30 Second Sit to Stand: 2 reps ?09/12/2021: 10 reps with UE support ?  ?  ?TODAY'S TREATMENT:  ?[facility] Adult PT Treatment:                                                DATE: 09/14/2021 ?Therapeutic Exercise: ?LAQ 3x15 4# ?Seated march 3x20 4# ?Seated hamstring curl 3x10 GTB ?Seated heel/toe raise 2x15 ?Seated hip adduction ball squeeze 2x15 ?Seated Row 3x10 BlueTB ?Seated horizontal  abduction GTB (pain in anterior right shoulder, terminated exercise) ?Seated bilateral ER GTB 2x10 ?STS x 10 - with UE support and elevated table ? ? ?[place] Adult PT Treatment:  DATE: 09/12/2021 ?Therapeutic Exercise: ?LAQ 2x15 4# ?Seated clamshell 2x20 Black TB ?Seated march 2x20 4# ?Seated hamstring curl 2x10 GTB ?Seated Row 2x10 BTB ?Seated horizontal abduction GTB 2x10 ?Seated bilateral ER GTB 3x10 ?STS x 10 - with UE support and elevated table ? ? ?[place] Adult PT Treatment:                                                DATE: 09/06/2021 ?Therapeutic Exercise: ?LAQ 2x15 4# ?Seated clamshell 2x20 Black TB ?Seated march 2x20 Black TB ?Seated hamstring curl 2x10 black TB ?Therapeutic Activity: ?Cleaning and dress of small cut/wound on pt's lower back ?Education on wound observation ?Assessment of orthostatics for possible BP contribution to dizziness ? ? ?  ?PATIENT EDUCATION:  ?Education details: continue HEP ?Person educated: Patient ?Education method: Explanation, Demonstration, and Handouts ?Education comprehension: verbalized understanding and returned demonstration ?  ?HOME EXERCISE PROGRAM: ?Access Code: [email or web address] ?URL: [email or web address] ?Date: 08/23/2021 ?Prepared by: Octavio Manns ? ?Exercises ?Standing Shoulder Row with Anchored Resistance - 1 x daily - 7 x weekly - 3 sets - 10 reps ?Seated Hip Abduction with Resistance - 1 x daily - 7 x weekly - 3 sets - 10 reps ?Seated March with Resistance - 1 x daily - 7 x weekly - 2 sets - 20 reps ?Seated Long Arc Quad - 1 x daily - 7 x weekly - 3 sets - 10 reps ?Seated Shoulder Horizontal Abduction with Resistance - Palms Down - 1 x daily - 7 x weekly - 2 sets - 10 reps ?  ?  ?ASSESSMENT: ?Patient presents to PT with moderate levels of pain and has not had any falls since our last session. Patient has noticeable swelling in her left lower extremity from her foot to her upper thigh, states her PCP did not  present any answers or diagnoses and says she is going to talk to her endocrinologist at her appointment next week. She was able to complete all prescribed exercises with notable increase in fatigue at the end o

## 2021-09-14 ENCOUNTER — Other Ambulatory Visit: Payer: Self-pay

## 2021-09-14 ENCOUNTER — Ambulatory Visit: Payer: Medicaid Other

## 2021-09-14 DIAGNOSIS — M6281 Muscle weakness (generalized): Secondary | ICD-10-CM

## 2021-09-14 DIAGNOSIS — R262 Difficulty in walking, not elsewhere classified: Secondary | ICD-10-CM

## 2021-09-17 DIAGNOSIS — F4325 Adjustment disorder with mixed disturbance of emotions and conduct: Secondary | ICD-10-CM | POA: Diagnosis not present

## 2021-09-18 ENCOUNTER — Ambulatory Visit: Payer: Medicaid Other

## 2021-09-18 ENCOUNTER — Other Ambulatory Visit: Payer: Self-pay

## 2021-09-18 DIAGNOSIS — R2681 Unsteadiness on feet: Secondary | ICD-10-CM

## 2021-09-18 DIAGNOSIS — M6281 Muscle weakness (generalized): Secondary | ICD-10-CM | POA: Diagnosis not present

## 2021-09-18 DIAGNOSIS — G8929 Other chronic pain: Secondary | ICD-10-CM

## 2021-09-18 DIAGNOSIS — R262 Difficulty in walking, not elsewhere classified: Secondary | ICD-10-CM

## 2021-09-18 DIAGNOSIS — R2689 Other abnormalities of gait and mobility: Secondary | ICD-10-CM

## 2021-09-18 NOTE — Therapy (Signed)
?OUTPATIENT PHYSICAL THERAPY TREATMENT NOTE ? ? ?Patient Name: Melinda [name] ?MRN: [id number] ?DOB:[birth year], 56 y.o., female ?Today's Date: 09/18/2021 ? ?PCP: [name], MD ?REFERRING PROVIDER: [name], MD ? ? PT End of Session - 09/18/21 1252   ? ? Visit Number 8   ? Number of Visits 17   ? Date for PT Re-Evaluation 10/01/21   ? Authorization Type [employer] MCD   ? PT Start Time 1252   ? PT Stop Time 1334   ? PT Time Calculation (min) 42 min   ? Activity Tolerance Patient limited by pain;Patient tolerated treatment well   ? Behavior During Therapy [facility] for tasks assessed/performed   ? ?  ?  ? ?  ? ? ? ? ? ? ? ? ?Past Medical History:  ?Diagnosis Date  ? ADHD (attention deficit hyperactivity disorder)   ? Arthritis   ? Asthma   ? Cancer [facility])   ? Thyroid  ? Chicken pox AGE 48  ? COPD (chronic obstructive pulmonary disease) ([place])   ? pt reported  ? Diabetes mellitus without complication ([place])   ? Diverticulosis, sigmoid   ? GERD (gastroesophageal reflux disease)   ? Glaucoma   ? BOTH EYES, NO EYE DROPS  ? Glaucoma   ? History of blood transfusion 08/2014  ?  2UNITS GIVEN AND IRON GIVEN  ? Hyperglycemia 09/09/2014  ? Hypertension   ? 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 HAD TO LEARN TO WALK AGAIN  ? Iron deficiency anemia due to chronic blood loss 09/09/2014  ? Low TSH level 09/08/2014  ? Lumbar herniated disc   ? Menorrhagia 09/08/2014  ? Migraine   ? CLUSTER AND MIGRAINES  ? Multiple thyroid nodules   ? Ovarian mass, right 09/09/2014  ? PE (pulmonary embolism) 2016  ? Peripheral neuropathy   ? FINGER TIPS AND TOES NUMB SOME  ? Preeclampsia 1983  ? WITH PREGNANCY  ? PTSD (post-traumatic stress disorder)   ? Scoliosis   ? Seizures ([place]) AGE 18  ? NONE SINCE, HAD CHICKEN POX THEN  ? ?Past Surgical History:  ?Procedure Laterality Date  ? ANGIOGRAM TO LEG  08-13-15  ? RIGHT  ? CHOLECYSTECTOMY    ? IR GENERIC HISTORICAL  04/04/2016  ? IR RADIOLOGIST EVAL & MGMT  04/04/2016 [name], MD [facility] INTERV RAD  ? RADIOLOGY WITH ANESTHESIA N/A 07/17/2021  ? Procedure: MRI LUMBAR SPINE WITHOUT CONTRAST; MRI CERVICAL SPINE WITHOUT CONTRAST WITH ANESTHESIA;  Surgeon: Radiologist, Medication, MD;  Location: [place];  Service: Radiology;  Laterality: N/A;  ? THYROIDECTOMY N/A 11/17/2015  ? Procedure: TOTAL THYROIDECTOMY;  Surgeon: Armandina Gemma, MD;  Location: [facility];  Service: General;  Laterality: N/A;  ? UTERINE ARTERY EMBOLIZATION Bilateral 08/13/2015  ? ?Patient Active Problem List  ? Diagnosis Date Noted  ? 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 Abdominal pannus 08/21/2021  ? Aortic atherosclerosis ([place]) 08/21/2021  ? At high risk for falls 04/20/2020  ? Hyperlipidemia 02/16/2020  ? Adrenal mass, right ([place]) 04/29/2019  ? Chronic cough 03/12/2018  ? Left shoulder pain 10/14/2016  ? Hypertension 10/14/2016  ? S/P thyroidectomy 06/27/2016  ? Chronic bilateral low back pain without sciatica 04/22/2016  ? Liver lesion 12/24/2015  ? Controlled type 2 diabetes mellitus with complication, with long-term current use of insulin ([place]) 10/10/2015  ? Vision loss 08/25/2015  ? Decreased vision in both eyes 08/25/2015  ? Prolonged Q-T interval on ECG 08/19/2015  ?  History of pulmonary embolism 08/10/2015  ? Mild persistent asthma 04/13/2015  ? Fibroid, uterine   ? Morbid obesity ([place]) 11/11/2014  ? Atypical chest pain 11/08/2014  ? Sinus tachycardia 11/08/2014  ? Migraine 10/14/2014  ? Menorrhagia 09/08/2014  ? ? ?REFERRING DIAG:  ?M48.02 (ICD-10-CM) - Spinal stenosis of cervical region ?M47.26 (ICD-10-CM) - Other spondylosis with radiculopathy, lumbar region ?M50.30 (ICD-10-CM) - Degenerative disc disease, cervical ?M47.812 (ICD-10-CM) - Spondylosis of cervical spine ?M77.8 (ICD-10-CM) - Tendinitis of shoulder, unspecified laterality ? ?THERAPY DIAG:  ?Muscle weakness (generalized) ? ?Difficulty in walking, not elsewhere classified ? ?Other abnormalities of gait and  mobility ? ?Unsteadiness on feet ? ?Chronic right shoulder pain ? ?PERTINENT HISTORY:  ?HTN, peripheral neuropathy, DM II, PE, thyroid cancer, COPD ? ?PRECAUTIONS: Fall ? ?SUBJECTIVE: My left foot is really swollen.  ? ?Pain: ?Are you having pain? Yes ?NPRS: 5/10 bilateral knees, L ankle, neck, shoulders ?Pain location: bilateral knees ?PAIN TYPE: sharp ?Pain description: intermittent  ?Aggravating factors: prolonged standing, walking ?Relieving factors: rest ? ? ?OBJECTIVE:  ?  ?PATIENT SURVEYS:  ?Modified Oswestry 86% disability  ?Quick Dash 87% disability ?  ? CERVICAL AROM/PROM ?  ?A/PROM A/PROM (deg) ?08/06/2021  ?Right rotation 43 p!  ?Left rotation 35  ? (Blank rows = not tested) ?  ?UE AROM/PROM: ?  ?A/PROM Right ?08/06/2021 Left ?08/06/2021  ?Shoulder flexion 82 WFL  ?Shoulder extension      ?Shoulder abduction 75 WFL  ?Shoulder adduction      ?Shoulder extension      ?Shoulder internal rotation 15 WFL  ?Shoulder external rotation 20 WFL  ?Elbow flexion      ?Elbow extension      ?Wrist flexion      ?Wrist extension      ?Wrist ulnar deviation      ?Wrist radial deviation      ?Wrist pronation      ?Wrist supination      ? (Blank rows = not tested) ?  ?UE MMT: ?  ?MMT Right ?08/06/2021 Left ?08/06/2021  ?Shoulder flexion 2+/5 3+/5  ?Shoulder abduction 2+/5 3+/5  ?Shoulder internal rotation 3/5 3+/5  ?Shoulder external rotation 35 3+/5  ?Grip strength 55# 54#  ? (Blank rows = not tested) ?  ?LE MMT: ?  ?MMT Right ?08/06/2021 Left ?08/06/2021 Right ?09/14/2021 Left ?09/14/2021  ?Hip flexion 3+/5 2+/5 4/5 3/5  ?Hip abduction 3+/5 2+/5    ?Hip adduction 3+/5 3/5    ?Knee flexion 4/5 3/5 4+/5 3+/5  ?Knee extension 4/5 3/5 4/5 3+/5  ? (Blank rows = not tested) ?  ?Orthostatics: ?        Sitting: 114/88 ?           Standing: 114/84  ?  ?FUNCTIONAL TESTS:  ?30 Second Sit to Stand: 2 reps ?09/12/2021: 10 reps with UE support ?  ? ?TODAY'S TREATMENT:  ?[facility] Adult PT Treatment:                                                DATE:  09/18/2021 ?Therapeutic Exercise: ?LAQ 3x15 4# ?Seated march 3x20 4# ?Seated hamstring curl 2x15 GTB ?Seated heel/toe raise 3x15 ?Seated hip adduction ball squeeze 5" hold x10 (pt doesn't enjoy due to body habitus) ?Seated shoulder flexion AROM with dowel 2x10 ?Seated Row 3x10 BlueTB ?Seated horizontal abduction GTB 2 x 15 (small range  to prevent pain in R shoulder) ?Seated bilateral ER GTB 2x15 ? ? ?[facility] Adult PT Treatment:                                                DATE: 09/14/2021 ?Therapeutic Exercise: ?LAQ 3x15 4# ?Seated march 3x20 4# ?Seated hamstring curl 3x10 GTB ?Seated heel/toe raise 2x15 ?Seated hip adduction ball squeeze 2x15 ?Seated Row 3x10 BlueTB ?Seated horizontal abduction GTB (pain in anterior right shoulder, terminated exercise) ?Seated bilateral ER GTB 2x10 ?STS x 10 - with UE support and elevated table ? ? ?[facility] Adult PT Treatment:                                                DATE: 09/12/2021 ?Therapeutic Exercise: ?LAQ 2x15 4# ?Seated clamshell 2x20 Black TB ?Seated march 2x20 4# ?Seated hamstring curl 2x10 GTB ?Seated Row 2x10 BTB ?Seated horizontal abduction GTB 2x10 ?Seated bilateral ER GTB 3x10 ?STS x 10 - with UE support and elevated table ? ? ?  ?PATIENT EDUCATION:  ?Education details: continue HEP ?Person educated: Patient ?Education method: Explanation, Demonstration, and Handouts ?Education comprehension: verbalized understanding and returned demonstration ?  ?HOME EXERCISE PROGRAM: ?Access Code: [email or web address] ?URL: [email or web address] ?Date: 08/23/2021 ?Prepared by: Octavio Manns ? ?Exercises ?Standing Shoulder Row with Anchored Resistance - 1 x daily - 7 x weekly - 3 sets - 10 reps ?Seated Hip Abduction with Resistance - 1 x daily - 7 x weekly - 3 sets - 10 reps ?Seated March with Resistance - 1 x daily - 7 x weekly - 2 sets - 20 reps ?Seated Long Arc Quad - 1 x daily - 7 x weekly - 3 sets - 10 reps ?Seated Shoulder Horizontal Abduction with Resistance - Palms Down - 1  x daily - 7 x weekly - 2 sets - 10 reps ?  ?  ?ASSESSMENT: ?Patient presents to PT with moderate levels of pain and does not report any recent falls. Patient has noticeable swelling in her left LE that she plans to speak with her end

## 2021-09-19 DIAGNOSIS — H0102B Squamous blepharitis left eye, upper and lower eyelids: Secondary | ICD-10-CM | POA: Diagnosis not present

## 2021-09-19 DIAGNOSIS — H2513 Age-related nuclear cataract, bilateral: Secondary | ICD-10-CM | POA: Diagnosis not present

## 2021-09-19 DIAGNOSIS — H31092 Other chorioretinal scars, left eye: Secondary | ICD-10-CM | POA: Diagnosis not present

## 2021-09-19 DIAGNOSIS — H0102A Squamous blepharitis right eye, upper and lower eyelids: Secondary | ICD-10-CM | POA: Diagnosis not present

## 2021-09-19 DIAGNOSIS — H05243 Constant exophthalmos, bilateral: Secondary | ICD-10-CM | POA: Diagnosis not present

## 2021-09-19 DIAGNOSIS — H04123 Dry eye syndrome of bilateral lacrimal glands: Secondary | ICD-10-CM | POA: Diagnosis not present

## 2021-09-19 DIAGNOSIS — E119 Type 2 diabetes mellitus without complications: Secondary | ICD-10-CM | POA: Diagnosis not present

## 2021-09-19 DIAGNOSIS — H40013 Open angle with borderline findings, low risk, bilateral: Secondary | ICD-10-CM | POA: Diagnosis not present

## 2021-09-19 LAB — HM DIABETES EYE EXAM

## 2021-09-21 DIAGNOSIS — Z85850 Personal history of malignant neoplasm of thyroid: Secondary | ICD-10-CM | POA: Diagnosis not present

## 2021-09-21 DIAGNOSIS — E119 Type 2 diabetes mellitus without complications: Secondary | ICD-10-CM | POA: Diagnosis not present

## 2021-09-21 DIAGNOSIS — E890 Postprocedural hypothyroidism: Secondary | ICD-10-CM | POA: Diagnosis not present

## 2021-09-25 ENCOUNTER — Ambulatory Visit: Payer: Medicaid Other

## 2021-09-25 NOTE — Therapy (Incomplete)
?OUTPATIENT PHYSICAL THERAPY TREATMENT NOTE ? ? ?Patient Name: Melinda [name] ?MRN: [id number] ?DOB:11/14/1965, 56 y.o., female ?Today's Date: 09/25/2021 ? ?PCP: [name], MD ?REFERRING PROVIDER: [name], MD ? ? ? ? ? ? ? ? ? ? ?Past Medical History:  ?Diagnosis Date  ? ADHD (attention deficit hyperactivity disorder)   ? Arthritis   ? Asthma   ? Cancer [facility])   ? Thyroid  ? Chicken pox AGE 76  ? COPD (chronic obstructive pulmonary disease) ([place])   ? pt reported  ? Diabetes mellitus without complication ([place])   ? Diverticulosis, sigmoid   ? GERD (gastroesophageal reflux disease)   ? Glaucoma   ? BOTH EYES, NO EYE DROPS  ? Glaucoma   ? History of blood transfusion 08/2014  ?  2UNITS GIVEN AND IRON GIVEN  ? Hyperglycemia 09/09/2014  ? Hypertension   ? 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 HAD TO LEARN TO WALK AGAIN  ? Iron deficiency anemia due to chronic blood loss 09/09/2014  ? Low TSH level 09/08/2014  ? Lumbar herniated disc   ? Menorrhagia 09/08/2014  ? Migraine   ? CLUSTER AND MIGRAINES  ? Multiple thyroid nodules   ? Ovarian mass, right 09/09/2014  ? PE (pulmonary embolism) 2016  ? Peripheral neuropathy   ? FINGER TIPS AND TOES NUMB SOME  ? Preeclampsia 1983  ? WITH PREGNANCY  ? PTSD (post-traumatic stress disorder)   ? Scoliosis   ? Seizures ([place]) AGE 59  ? NONE SINCE, HAD CHICKEN POX THEN  ? ?Past Surgical History:  ?Procedure Laterality Date  ? ANGIOGRAM TO LEG  08-13-15  ? RIGHT  ? CHOLECYSTECTOMY    ? IR GENERIC HISTORICAL  04/04/2016  ? IR RADIOLOGIST EVAL & MGMT 04/04/2016 [name], MD [facility] INTERV RAD  ? RADIOLOGY WITH ANESTHESIA N/A 07/17/2021  ? Procedure: MRI LUMBAR SPINE WITHOUT CONTRAST; MRI CERVICAL SPINE WITHOUT CONTRAST WITH ANESTHESIA;  Surgeon: Radiologist, Medication, MD;  Location: [place];  Service: Radiology;  Laterality: N/A;  ? THYROIDECTOMY N/A 11/17/2015  ? Procedure: TOTAL THYROIDECTOMY;  Surgeon: Armandina Gemma, MD;  [facility];  Service: General;  Laterality: N/A;  ? UTERINE ARTERY EMBOLIZATION Bilateral 08/13/2015  ? ?Patient Active Problem List  ? Diagnosis Date Noted  ? 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 Abdominal pannus 08/21/2021  ? Aortic atherosclerosis ([place]) 08/21/2021  ? At high risk for falls 04/20/2020  ? Hyperlipidemia 02/16/2020  ? Adrenal mass, right ([place]) 04/29/2019  ? Chronic cough 03/12/2018  ? Left shoulder pain 10/14/2016  ? Hypertension 10/14/2016  ? S/P thyroidectomy 06/27/2016  ? Chronic bilateral low back pain without sciatica 04/22/2016  ? Liver lesion 12/24/2015  ? Controlled type 2 diabetes mellitus with complication, with long-term current use of insulin ([place]) 10/10/2015  ? Vision loss 08/25/2015  ? Decreased vision in both eyes 08/25/2015  ? Prolonged Q-T interval on ECG 08/19/2015  ? History of pulmonary embolism 08/10/2015  ? Mild persistent asthma 04/13/2015  ? Fibroid, uterine   ? Morbid obesity ([place]) 11/11/2014  ? Atypical chest pain 11/08/2014  ? Sinus tachycardia 11/08/2014  ? Migraine 10/14/2014  ? Menorrhagia 09/08/2014  ? ? ?REFERRING DIAG:  ?M48.02 (ICD-10-CM) - Spinal stenosis of cervical region ?M47.26 (ICD-10-CM) - Other spondylosis with radiculopathy, lumbar region ?M50.30 (ICD-10-CM) - Degenerative disc disease, cervical ?M47.812 (ICD-10-CM) - Spondylosis of cervical spine ?M77.8 (ICD-10-CM) - Tendinitis of shoulder, unspecified laterality ? ?THERAPY DIAG:  ?  No diagnosis found. ? ?PERTINENT HISTORY:  ?HTN, peripheral neuropathy, DM II, PE, thyroid cancer, COPD ? ?PRECAUTIONS: Fall ? ?SUBJECTIVE:  ?*** ? ?Pain: ?Are you having pain? Yes ?NPRS: 5/10 bilateral knees, L ankle, neck, shoulders ?Pain location: bilateral knees ?PAIN TYPE: sharp ?Pain description: intermittent  ?Aggravating factors: prolonged standing, walking ?Relieving factors: rest ? ? ?OBJECTIVE:  ?  ?PATIENT SURVEYS:  ?Modified Oswestry 86% disability  ?Quick Dash 87% disability ?  ? CERVICAL AROM/PROM ?  ?A/PROM  A/PROM (deg) ?08/06/2021  ?Right rotation 43 p!  ?Left rotation 35  ? (Blank rows = not tested) ?  ?UE AROM/PROM: ?  ?A/PROM Right ?08/06/2021 Left ?08/06/2021  ?Shoulder flexion 82 WFL  ?Shoulder extension      ?Shoulder abduction 75 WFL  ?Shoulder adduction      ?Shoulder extension      ?Shoulder internal rotation 15 WFL  ?Shoulder external rotation 20 WFL  ?Elbow flexion      ?Elbow extension      ?Wrist flexion      ?Wrist extension      ?Wrist ulnar deviation      ?Wrist radial deviation      ?Wrist pronation      ?Wrist supination      ? (Blank rows = not tested) ?  ?UE MMT: ?  ?MMT Right ?08/06/2021 Left ?08/06/2021  ?Shoulder flexion 2+/5 3+/5  ?Shoulder abduction 2+/5 3+/5  ?Shoulder internal rotation 3/5 3+/5  ?Shoulder external rotation 35 3+/5  ?Grip strength 55# 54#  ? (Blank rows = not tested) ?  ?LE MMT: ?  ?MMT Right ?08/06/2021 Left ?08/06/2021 Right ?09/14/2021 Left ?09/14/2021  ?Hip flexion 3+/5 2+/5 4/5 3/5  ?Hip abduction 3+/5 2+/5    ?Hip adduction 3+/5 3/5    ?Knee flexion 4/5 3/5 4+/5 3+/5  ?Knee extension 4/5 3/5 4/5 3+/5  ? (Blank rows = not tested) ?  ?Orthostatics: ?        Sitting: 114/88 ?           Standing: 114/84  ?  ?FUNCTIONAL TESTS:  ?30 Second Sit to Stand: 2 reps ?09/12/2021: 10 reps with UE support ?  ? ?TODAY'S TREATMENT:  ?[facility] Adult PT Treatment:                                                DATE: 09/25/2021 ?Therapeutic Exercise: ?LAQ 3x15 4# ?Seated march 3x20 4# ?Seated hamstring curl 2x15 GTB ?Seated heel/toe raise 3x15 ?Seated hip adduction ball squeeze 5" hold x10 (pt doesn't enjoy due to body habitus) ?Seated shoulder flexion AROM with dowel 2x10 ?Seated Row 3x10 BlueTB ?Seated horizontal abduction GTB 2 x 15 (small range to prevent pain in R shoulder) ?Seated bilateral ER GTB 2x15 ? ?[facility] Adult PT Treatment:                                                DATE: 09/18/2021 ?Therapeutic Exercise: ?LAQ 3x15 4# ?Seated march 3x20 4# ?Seated hamstring curl 2x15 GTB ?Seated heel/toe raise  3x15 ?Seated hip adduction ball squeeze 5" hold x10 (pt doesn't enjoy due to body habitus) ?Seated shoulder flexion AROM with dowel 2x10 ?Seated Row 3x10 BlueTB ?Seated horizontal abduction GTB 2 x 15 (small range  to prevent pain in R shoulder) ?Seated bilateral ER GTB 2x15 ? ? ?[facility] Adult PT Treatment:                                                DATE: 09/14/2021 ?Therapeutic Exercise: ?LAQ 3x15 4# ?Seated march 3x20 4# ?Seated hamstring curl 3x10 GTB ?Seated heel/toe raise 2x15 ?Seated hip adduction ball squeeze 2x15 ?Seated Row 3x10 BlueTB ?Seated horizontal abduction GTB (pain in anterior right shoulder, terminated exercise) ?Seated bilateral ER GTB 2x10 ?STS x 10 - with UE support and elevated table ? ? ?[place] Adult PT Treatment:                                                DATE: 09/12/2021 ?Therapeutic Exercise: ?LAQ 2x15 4# ?Seated clamshell 2x20 Black TB ?Seated march 2x20 4# ?Seated hamstring curl 2x10 GTB ?Seated Row 2x10 BTB ?Seated horizontal abduction GTB 2x10 ?Seated bilateral ER GTB 3x10 ?STS x 10 - with UE support and elevated table ? ? ?  ?PATIENT EDUCATION:  ?Education details: continue HEP ?Person educated: Patient ?Education method: Explanation, Demonstration, and Handouts ?Education comprehension: verbalized understanding and returned demonstration ?  ?HOME EXERCISE PROGRAM: ?Access Code: [email or web address] ?URL: [email or web address] ?Date: 08/23/2021 ?Prepared by: Octavio Manns ? ?Exercises ?Standing Shoulder Row with Anchored Resistance - 1 x daily - 7 x weekly - 3 sets - 10 reps ?Seated Hip Abduction with Resistance - 1 x daily - 7 x weekly - 3 sets - 10 reps ?Seated March with Resistance - 1 x daily - 7 x weekly - 2 sets - 20 reps ?Seated Long Arc Quad - 1 x daily - 7 x weekly - 3 sets - 10 reps ?Seated Shoulder Horizontal Abduction with Resistance - Palms Down - 1 x daily - 7 x weekly - 2 sets - 10 reps ?  ?  ?ASSESSMENT: ?*** ? ? ?Problem List: ?Abnormal gait, decreased activity  tolerance, decreased balance, decreased endurance, decreased mobility, difficulty walking, decreased ROM, decreased strength, obesity, and pain ?  ?  ?GOALS: ?Goals reviewed with patient? No ?  ?SHORT TERM GOAL

## 2021-09-25 NOTE — Telephone Encounter (Signed)
Telephone call from [facility] for Appeal for [place].  ?Pt frequency of headaches in the last three months. Advised pt has a follow 6 months from her last visit. ?Why are we still waiting on an appeal done in Jan 2023. ? ?

## 2021-09-27 ENCOUNTER — Encounter: Payer: Self-pay | Admitting: Specialist

## 2021-09-27 ENCOUNTER — Ambulatory Visit (INDEPENDENT_AMBULATORY_CARE_PROVIDER_SITE_OTHER): Payer: Medicaid Other | Admitting: Specialist

## 2021-09-27 VITALS — BP 122/78 | HR 85 | Ht 70.0 in | Wt 351.0 lb

## 2021-09-27 DIAGNOSIS — M778 Other enthesopathies, not elsewhere classified: Secondary | ICD-10-CM

## 2021-09-27 DIAGNOSIS — Z6841 Body Mass Index (BMI) 40.0 and over, adult: Secondary | ICD-10-CM | POA: Diagnosis not present

## 2021-09-27 DIAGNOSIS — M7501 Adhesive capsulitis of right shoulder: Secondary | ICD-10-CM | POA: Diagnosis not present

## 2021-09-27 DIAGNOSIS — M25511 Pain in right shoulder: Secondary | ICD-10-CM

## 2021-09-27 DIAGNOSIS — M5416 Radiculopathy, lumbar region: Secondary | ICD-10-CM | POA: Diagnosis not present

## 2021-09-27 DIAGNOSIS — M7580 Other shoulder lesions, unspecified shoulder: Secondary | ICD-10-CM

## 2021-09-27 DIAGNOSIS — M51369 Other intervertebral disc degeneration, lumbar region without mention of lumbar back pain or lower extremity pain: Secondary | ICD-10-CM

## 2021-09-27 DIAGNOSIS — G8929 Other chronic pain: Secondary | ICD-10-CM

## 2021-09-27 DIAGNOSIS — M4726 Other spondylosis with radiculopathy, lumbar region: Secondary | ICD-10-CM | POA: Diagnosis not present

## 2021-09-27 DIAGNOSIS — M5136 Other intervertebral disc degeneration, lumbar region: Secondary | ICD-10-CM | POA: Diagnosis not present

## 2021-09-27 NOTE — Patient Instructions (Addendum)
Avoid bending, stooping and avoid lifting weights greater than 10 lbs. ?Avoid prolong standing and walking. ?Avoid frequent bending and stooping  ?No lifting greater than 10 lbs. ?May use ice or moist heat for pain. ?Weight loss is of benefit. ?Handicap license is approved. ?Dr. [name] secretary/Assistant will call to arrange for Left L5-S1 epidural steroid injection   ?Please call your primary care physician and request a referral for a weight reduction program or consideration of bariatric surgical intervention. I think that weight loss is possible but with endocrine concerns and history of stroke you will need medical guidance.  ?

## 2021-09-27 NOTE — Progress Notes (Signed)
? ?Office Visit Note ?  ?Patient: Melinda [name]           ?Date of Birth: 1965/11/30           ?MRN: [id number] ?Visit Date: 09/27/2021 ?             ?Requested by: [name], MD ?[address] ?[address] ?[place],  [place] [address] ?PCP: [name], MD ? ? ?Assessment & Plan: ?Visit Diagnoses:  ?1. Tendinitis of shoulder, unspecified laterality   ?2. Adhesive capsulitis of right shoulder   ?3. Degenerative disc disease, lumbar   ?4. Other spondylosis with radiculopathy, lumbar region   ?5. Lumbar radiculopathy   ?6. Chronic right shoulder pain   ?7. Body mass index 50.0-59.9, adult ([place])   ? ? ?Plan: Avoid bending, stooping and avoid lifting weights greater than 10 lbs. ?Avoid prolong standing and walking. ?Avoid frequent bending and stooping  ?No lifting greater than 10 lbs. ?May use ice or moist heat for pain. ?Weight loss is of benefit. ?Handicap license is approved. ?Dr. [name] secretary/Assistant will call to arrange for Left L5-S1 epidural steroid injection   ?Please call your primary care physician and request a referral for a weight reduction program or consideration of bariatric surgical intervention. I think that weight loss is possible but with endocrine concerns and history of stroke you will need medical guidance.  ? ?Follow-Up Instructions: Return in about 6 weeks (around 11/08/2021).  ? ?Orders:  ?Orders Placed This Encounter  ?Procedures  ? Ambulatory referral to Physical Medicine Rehab  ? ?No orders of the defined types were placed in this encounter. ? ? ? ? Procedures: ?No procedures performed ? ? ?Clinical Data: ?No additional findings. ? ? ?Subjective: ?Chief Complaint  ?Patient presents with  ? Right Shoulder - Follow-up, Pain  ? ? ?56 year old female with history of diabetes and obesity and lumbar DDD with spondylosis. She is going to try to lose weight and this is a good goal as  ?Her weight is a primary concern for longevity and morbidity. Losing weight would impact much of her  medical illness. She had ESI left L5-S1with good improvement in the left leg and buttock pain but is still having right buttock and thight pain. Chronic lymph edema or venous stasis left leg with goes back years. She is in PT and thery are working with the right shoulder. These are all conservative modes of treatment not requiring a surgical solution and I recommend that they be done, if no improvement the surgical solution may be a consideration but unfortunately her size is a major concern in terms of ?Risk of complication from any surgery mainly healing and wound complication.  ? ? ?Review of Systems  ?Constitutional: Negative.   ?HENT: Negative.    ?Eyes: Negative.   ?Respiratory: Negative.    ?Cardiovascular: Negative.   ?Gastrointestinal: Negative.   ?Endocrine: Negative.   ?Genitourinary: Negative.   ?Musculoskeletal: Negative.   ?Skin: Negative.   ?Allergic/Immunologic: Negative.   ?Neurological: Negative.   ?Hematological: Negative.   ?Psychiatric/Behavioral: Negative.    ? ? ?Objective: ?Vital Signs: BP 122/78 (BP Location: Left Arm, Patient Position: Sitting)   Pulse 85   Ht '5\' 10"'$  (1.778 m)   Wt (!) 351 lb (159.2 kg)   BMI 50.36 kg/m?  ? ?Physical Exam ?Constitutional:   ?   Appearance: She is well-developed.  ?HENT:  ?   Head: Normocephalic and atraumatic.  ?Eyes:  ?   Pupils: Pupils are equal,  round, and reactive to light.  ?Pulmonary:  ?   Effort: Pulmonary effort is normal.  ?   Breath sounds: Normal breath sounds.  ?Abdominal:  ?   General: Bowel sounds are normal.  ?   Palpations: Abdomen is soft.  ?Musculoskeletal:     ?   General: Normal range of motion.  ?   Cervical back: Normal range of motion and neck supple.  ?   Lumbar back: Negative right straight leg raise test and negative left straight leg raise test.  ?Skin: ?   General: Skin is warm and dry.  ?Neurological:  ?   Mental Status: She is alert and oriented to person, place, and time.  ?Psychiatric:     ?   Behavior: Behavior normal.      ?   Thought Content: Thought content normal.     ?   Judgment: Judgment normal.  ? ? ?Back Exam  ? ?Tenderness  ?The patient is experiencing tenderness in the lumbar. ? ?Range of Motion  ?Flexion:  normal  ?Lateral bend right:  normal  ?Rotation right:  normal  ? ?Muscle Strength  ?Right Quadriceps:  5/5  ?Left Quadriceps:  5/5  ?Right Hamstrings:  5/5  ?Left Hamstrings:  5/5  ? ?Tests  ?Straight leg raise right: negative ?Straight leg raise left: negative ? ?Reflexes  ?Patellar:  0/4 ?Achilles:  0/4 ? ?Other  ?Toe walk: normal ?Heel walk: normal ? ? ? ? ?Specialty Comments:  ?No specialty comments available. ? ?Imaging: ?No results found. ? ? ?PMFS History: ?Patient Active Problem List  ? Diagnosis Date Noted  ? 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 Abdominal pannus 08/21/2021  ? Aortic atherosclerosis ([place]) 08/21/2021  ? At high risk for falls 04/20/2020  ? Hyperlipidemia 02/16/2020  ? Adrenal mass, right ([place]) 04/29/2019  ? Chronic cough 03/12/2018  ? Left shoulder pain 10/14/2016  ? Hypertension 10/14/2016  ? S/P thyroidectomy 06/27/2016  ? Chronic bilateral low back pain without sciatica 04/22/2016  ? Liver lesion 12/24/2015  ? Controlled type 2 diabetes mellitus with complication, with long-term current use of insulin ([place]) 10/10/2015  ? Vision loss 08/25/2015  ? Decreased vision in both eyes 08/25/2015  ? Prolonged Q-T interval on ECG 08/19/2015  ? History of pulmonary embolism 08/10/2015  ? Mild persistent asthma 04/13/2015  ? Fibroid, uterine   ? Morbid obesity ([place]) 11/11/2014  ? Atypical chest pain 11/08/2014  ? Sinus tachycardia 11/08/2014  ? Migraine 10/14/2014  ? Menorrhagia 09/08/2014  ? ?Past Medical History:  ?Diagnosis Date  ? ADHD (attention deficit hyperactivity disorder)   ? Arthritis   ? Asthma   ? Cancer [facility])   ? Thyroid  ? Chicken pox AGE 47  ? COPD (chronic obstructive pulmonary disease) ([place])   ? pt reported  ? Diabetes mellitus without complication ([place])   ? Diverticulosis,  sigmoid   ? GERD (gastroesophageal reflux disease)   ? Glaucoma   ? BOTH EYES, NO EYE DROPS  ? Glaucoma   ? History of blood transfusion 08/2014  ?  2UNITS GIVEN AND IRON GIVEN  ? Hyperglycemia 09/09/2014  ? Hypertension   ? 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 HAD TO LEARN TO WALK AGAIN  ? Iron deficiency anemia due to chronic blood loss 09/09/2014  ? Low TSH level 09/08/2014  ? Lumbar herniated disc   ? Menorrhagia 09/08/2014  ? Migraine   ? CLUSTER AND MIGRAINES  ?  Multiple thyroid nodules   ? Ovarian mass, right 09/09/2014  ? PE (pulmonary embolism) 2016  ? Peripheral neuropathy   ? FINGER TIPS AND TOES NUMB SOME  ? Preeclampsia 1983  ? WITH PREGNANCY  ? PTSD (post-traumatic stress disorder)   ? Scoliosis   ? Seizures ([place]) AGE 58  ? NONE SINCE, HAD CHICKEN POX THEN  ?  ?Family History  ?Problem Relation Age of Onset  ? Diabetes Mother   ? Breast cancer Mother   ? CAD Mother   ? Hypertension Mother   ? Alcohol abuse Father   ? Hypertension Father   ? Breast cancer Maternal Aunt   ? Breast cancer Maternal Aunt   ?  ?Past Surgical History:  ?Procedure Laterality Date  ? ANGIOGRAM TO LEG  08-13-15  ? RIGHT  ? CHOLECYSTECTOMY    ? IR GENERIC HISTORICAL  04/04/2016  ? IR RADIOLOGIST EVAL & MGMT 04/04/2016 [name], MD [facility] INTERV RAD  ? RADIOLOGY WITH ANESTHESIA N/A 07/17/2021  ? Procedure: MRI LUMBAR SPINE WITHOUT CONTRAST; MRI CERVICAL SPINE WITHOUT CONTRAST WITH ANESTHESIA;  Surgeon: Radiologist, Medication, MD;  Location: [place];  Service: Radiology;  Laterality: N/A;  ? THYROIDECTOMY N/A 11/17/2015  ? Procedure: TOTAL THYROIDECTOMY;  Surgeon: Armandina Gemma, MD;  Location[facility];  Service: General;  Laterality: N/A;  ? UTERINE ARTERY EMBOLIZATION Bilateral 08/13/2015  ? ?Social History  ? ?Occupational History  ? Occupation: disabled  ?Tobacco Use  ? Smoking status: Former  ?  Packs/day: 0.50  ?  Years: 40.00  ?  Pack years: 20.00  ?  Types: Cigarettes  ?  Quit date:  11/03/2014  ?  Years since quitting: 6.9  ? Smokeless tobacco: Never  ?Vaping Use  ? Vaping Use: Never used  ?Substance and Sexual Activity  ? Alcohol use: No  ?  Alcohol/week: 0.0 standard drinks  ? Drug use:

## 2021-09-28 ENCOUNTER — Ambulatory Visit: Payer: Medicaid Other

## 2021-09-28 ENCOUNTER — Other Ambulatory Visit: Payer: Self-pay | Admitting: Internal Medicine

## 2021-09-28 ENCOUNTER — Telehealth: Payer: Self-pay

## 2021-09-28 DIAGNOSIS — Z794 Long term (current) use of insulin: Secondary | ICD-10-CM

## 2021-09-28 DIAGNOSIS — L309 Dermatitis, unspecified: Secondary | ICD-10-CM

## 2021-09-28 DIAGNOSIS — M436 Torticollis: Secondary | ICD-10-CM

## 2021-09-28 DIAGNOSIS — R2681 Unsteadiness on feet: Secondary | ICD-10-CM

## 2021-09-28 DIAGNOSIS — R262 Difficulty in walking, not elsewhere classified: Secondary | ICD-10-CM

## 2021-09-28 DIAGNOSIS — R2689 Other abnormalities of gait and mobility: Secondary | ICD-10-CM

## 2021-09-28 DIAGNOSIS — M5412 Radiculopathy, cervical region: Secondary | ICD-10-CM

## 2021-09-28 DIAGNOSIS — M6281 Muscle weakness (generalized): Secondary | ICD-10-CM | POA: Diagnosis not present

## 2021-09-28 DIAGNOSIS — I10 Essential (primary) hypertension: Secondary | ICD-10-CM

## 2021-09-28 DIAGNOSIS — J4550 Severe persistent asthma, uncomplicated: Secondary | ICD-10-CM

## 2021-09-28 DIAGNOSIS — G8929 Other chronic pain: Secondary | ICD-10-CM

## 2021-09-28 DIAGNOSIS — S46011D Strain of muscle(s) and tendon(s) of the rotator cuff of right shoulder, subsequent encounter: Secondary | ICD-10-CM

## 2021-09-28 DIAGNOSIS — R293 Abnormal posture: Secondary | ICD-10-CM

## 2021-09-28 DIAGNOSIS — R252 Cramp and spasm: Secondary | ICD-10-CM

## 2021-09-28 DIAGNOSIS — Z86718 Personal history of other venous thrombosis and embolism: Secondary | ICD-10-CM

## 2021-09-28 NOTE — Patient Instructions (Addendum)
Aquatic Therapy at [facility]-  What to Expect!  Where:   [facility] [address] [place],  [address] Rehab phone [phone]  NOTE:  You will receive an automated phone message reminding you of your appt and it will say the appointment is at the [address] [facility] [address] [facility] clinic.          How to Prepare: Please make sure you drink 8 ounces of water about one hour prior to your pool session A caregiver may attend if needed with the patient to help assist as needed. A caregiver can sit in the pool room on chair. Please arrive IN YOUR SUIT and 15 minutes prior to your appointment - this helps to avoid delays in starting your session. Please make sure to attend to any toileting needs prior to entering the pool Locker rooms for changing are provided.   There is direct access to the pool deck form the locker room.  You can lock your belongings in a locker with lock provided. Once on the pool deck your therapist will ask if you have signed the Patient  Consent and Assignment of Benefits form before beginning treatment Your therapist may take your blood pressure prior to, during and after your session if indicated We usually try and create a home exercise program based on activities we do in the pool.  Please be thinking about who might be able to assist you in the pool should you need to participate in an aquatic home exercise program at the time of discharge if you need assistance.  Some patients do not want to or do not have the ability to participate in an aquatic home program - this is not a barrier in any way to you participating in aquatic therapy as part of your current therapy plan! After Discharge from PT, you can continue using home program at  the [facility]/, there is a drop-in fee for $5 ($45 a month)or for 60 years  or older $4.00 ($40 a month for seniors ) or any local [employer] pool.  Memberships for purchase are  available for gym/pool at [facility]  IT IS VERY IMPORTANT THAT YOUR LAST VISIT BE IN THE CLINIC AT [address] AFTER YOUR LAST AQUATIC VISIT.  PLEASE MAKE SURE THAT YOU HAVE A [address]  APPOINTMENT SCHEDULED.   About the pool: Pool is located approximately 500 FT from the entrance of the building.  Please bring a support person if you need assistance traveling this      distance.   Your therapist will assist you in entering the water; there are two ways to           enter: stairs with railings, and a mechanical lift. Your therapist will determine the most appropriate way for you.  Water temperature is usually between 88-90 degrees  There may be up to 2 other swimmers in the pool at the same time  The pool deck is tile, please wear shoes with good traction if you prefer not to be barefoot.    Contact Info:  For appointment scheduling and cancellations:         Please call the [facility]  PH:[phone]              Aquatic Therapy  Outpatient Rehabilitation @ [facility]       All sessions are 45 minutes                                                    

## 2021-09-28 NOTE — Therapy (Incomplete)
?OUTPATIENT PHYSICAL THERAPY TREATMENT NOTE ? ? ?Patient Name: Melinda [name] ?MRN: [id number] ?DOB:1966-03-12, 56 y.o., female ?Today's Date: 09/28/2021 ? ?PCP: [name], MD ?REFERRING PROVIDER: Jessy Oto, MD ? ? ? ? ? ? ? ? ? ? ?Past Medical History:  ?Diagnosis Date  ? ADHD (attention deficit hyperactivity disorder)   ? Arthritis   ? Asthma   ? Cancer [facility])   ? Thyroid  ? Chicken pox AGE 79  ? COPD (chronic obstructive pulmonary disease) ([place])   ? pt reported  ? Diabetes mellitus without complication ([place])   ? Diverticulosis, sigmoid   ? GERD (gastroesophageal reflux disease)   ? Glaucoma   ? BOTH EYES, NO EYE DROPS  ? Glaucoma   ? History of blood transfusion 08/2014  ?  2UNITS GIVEN AND IRON GIVEN  ? Hyperglycemia 09/09/2014  ? Hypertension   ? 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 HAD TO LEARN TO WALK AGAIN  ? Iron deficiency anemia due to chronic blood loss 09/09/2014  ? Low TSH level 09/08/2014  ? Lumbar herniated disc   ? Menorrhagia 09/08/2014  ? Migraine   ? CLUSTER AND MIGRAINES  ? Multiple thyroid nodules   ? Ovarian mass, right 09/09/2014  ? PE (pulmonary embolism) 2016  ? Peripheral neuropathy   ? FINGER TIPS AND TOES NUMB SOME  ? Preeclampsia 1983  ? WITH PREGNANCY  ? PTSD (post-traumatic stress disorder)   ? Scoliosis   ? Seizures ([place]) AGE 32  ? NONE SINCE, HAD CHICKEN POX THEN  ? ?Past Surgical History:  ?Procedure Laterality Date  ? ANGIOGRAM TO LEG  08-13-15  ? RIGHT  ? CHOLECYSTECTOMY    ? IR GENERIC HISTORICAL  04/04/2016  ? IR RADIOLOGIST EVAL & MGMT 04/04/2016 [name], MD [facility] INTERV RAD  ? RADIOLOGY WITH ANESTHESIA N/A 07/17/2021  ? Procedure: MRI LUMBAR SPINE WITHOUT CONTRAST; MRI CERVICAL SPINE WITHOUT CONTRAST WITH ANESTHESIA;  Surgeon: Radiologist, Medication, MD;  Location: [place] [facility];  Service: Radiology;  Laterality: N/A;  ? THYROIDECTOMY N/A 11/17/2015  ? Procedure: TOTAL THYROIDECTOMY;  Surgeon: Armandina Gemma, MD;  [facility];   Service: General;  Laterality: N/A;  ? UTERINE ARTERY EMBOLIZATION Bilateral 08/13/2015  ? ?Patient Active Problem List  ? Diagnosis Date Noted  ? 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 Abdominal pannus 08/21/2021  ? Aortic atherosclerosis ([place]) 08/21/2021  ? At high risk for falls 04/20/2020  ? Hyperlipidemia 02/16/2020  ? Adrenal mass, right ([place]) 04/29/2019  ? Chronic cough 03/12/2018  ? Left shoulder pain 10/14/2016  ? Hypertension 10/14/2016  ? S/P thyroidectomy 06/27/2016  ? Chronic bilateral low back pain without sciatica 04/22/2016  ? Liver lesion 12/24/2015  ? Controlled type 2 diabetes mellitus with complication, with long-term current use of insulin ([place]) 10/10/2015  ? Vision loss 08/25/2015  ? Decreased vision in both eyes 08/25/2015  ? Prolonged Q-T interval on ECG 08/19/2015  ? History of pulmonary embolism 08/10/2015  ? Mild persistent asthma 04/13/2015  ? Fibroid, uterine   ? Morbid obesity ([place]) 11/11/2014  ? Atypical chest pain 11/08/2014  ? Sinus tachycardia 11/08/2014  ? Migraine 10/14/2014  ? Menorrhagia 09/08/2014  ? ? ?REFERRING DIAG:  ?M48.02 (ICD-10-CM) - Spinal stenosis of cervical region ?M47.26 (ICD-10-CM) - Other spondylosis with radiculopathy, lumbar region ?M50.30 (ICD-10-CM) - Degenerative disc disease, cervical ?M47.812 (ICD-10-CM) - Spondylosis of cervical spine ?M77.8 (ICD-10-CM) - Tendinitis of shoulder, unspecified laterality ? ?THERAPY DIAG:  ?  No diagnosis found. ? ?PERTINENT HISTORY:  ?HTN, peripheral neuropathy, DM II, PE, thyroid cancer, COPD ? ?PRECAUTIONS: Fall ? ?SUBJECTIVE: *** ?My left foot is really swollen.  ? ?Pain: ?Are you having pain? Yes ?NPRS: ***/10 bilateral knees, L ankle, neck, shoulders ?Pain location: bilateral knees ?PAIN TYPE: sharp ?Pain description: intermittent  ?Aggravating factors: prolonged standing, walking ?Relieving factors: rest ? ? ?OBJECTIVE:  ?  ?PATIENT SURVEYS:  ?Modified Oswestry 86% disability  ?Quick Dash 87% disability ?  ?  CERVICAL AROM/PROM ?  ?A/PROM A/PROM (deg) ?08/06/2021  ?Right rotation 43 p!  ?Left rotation 35  ? (Blank rows = not tested) ?  ?UE AROM/PROM: ?  ?A/PROM Right ?08/06/2021 Left ?08/06/2021  ?Shoulder flexion 82 WFL  ?Shoulder extension      ?Shoulder abduction 75 WFL  ?Shoulder adduction      ?Shoulder extension      ?Shoulder internal rotation 15 WFL  ?Shoulder external rotation 20 WFL  ?Elbow flexion      ?Elbow extension      ?Wrist flexion      ?Wrist extension      ?Wrist ulnar deviation      ?Wrist radial deviation      ?Wrist pronation      ?Wrist supination      ? (Blank rows = not tested) ?  ?UE MMT: ?  ?MMT Right ?08/06/2021 Left ?08/06/2021 Right ?09/28/2021 Left ?09/28/2021  ?Shoulder flexion 2+/5 3+/5    ?Shoulder abduction 2+/5 3+/5    ?Shoulder internal rotation 3/5 3+/5    ?Shoulder external rotation 3/5 3+/5    ?Grip strength 55# 54#    ? (Blank rows = not tested) ?  ?LE MMT: ?  ?MMT Right ?08/06/2021 Left ?08/06/2021 Right ?09/14/2021 Left ?09/14/2021  ?Hip flexion 3+/5 2+/5 4/5 3/5  ?Hip abduction 3+/5 2+/5    ?Hip adduction 3+/5 3/5    ?Knee flexion 4/5 3/5 4+/5 3+/5  ?Knee extension 4/5 3/5 4/5 3+/5  ? (Blank rows = not tested) ?  ?Orthostatics: ?        Sitting: 114/88 ?           Standing: 114/84  ?  ?FUNCTIONAL TESTS:  ?30 Second Sit to Stand: 2 reps ?09/12/2021: 10 reps with UE support ?  ? ?TODAY'S TREATMENT:  ?[facility] Adult PT Treatment:                                                DATE: 09/28/2021 ?Therapeutic Exercise: ?UE MMT*** ?LAQ 3x15 4# ?Seated march 3x20 4# ?Seated hamstring curl 2x15 GTB ?Seated heel/toe raise 3x15 ? Seated clamshell 2x20 Black TB ?Seated shoulder flexion AROM with dowel 2x10 ?Elbow flexion bicep curls 1# 2x30 ? Seated Row 3x10 BlueTB ? Seated horizontal abduction GTB 2 x 15 (small range to prevent pain in R shoulder) ?Seated bilateral ER GTB 2x15 ?STS 2x10 ? ? ? ?[facility] Adult PT Treatment:                                                DATE: 09/18/2021 ?Therapeutic Exercise: ?LAQ 3x15  4# ?Seated march 3x20 4# ?Seated hamstring curl 2x15 GTB ?Seated heel/toe raise 3x15 ?Seated hip adduction ball squeeze 5" hold x10 (pt doesn't  enjoy due to body habitus) ?Seated shoulder flexion AROM with dowel 2x10 ?Seated Row 3x10 BlueTB ?Seated horizontal abduction GTB 2 x 15 (small range to prevent pain in R shoulder) ?Seated bilateral ER GTB 2x15 ? ? ?[facility] Adult PT Treatment:                                                DATE: 09/14/2021 ?Therapeutic Exercise: ?LAQ 3x15 4# ?Seated march 3x20 4# ?Seated hamstring curl 3x10 GTB ?Seated heel/toe raise 2x15 ?Seated hip adduction ball squeeze 2x15 ?Seated Row 3x10 BlueTB ?Seated horizontal abduction GTB (pain in anterior right shoulder, terminated exercise) ?Seated bilateral ER GTB 2x10 ?STS x 10 - with UE support and elevated table ? ? ?  ?PATIENT EDUCATION:  ?Education details: continue HEP ?Person educated: Patient ?Education method: Explanation, Demonstration, and Handouts ?Education comprehension: verbalized understanding and returned demonstration ?  ?HOME EXERCISE PROGRAM: ?Access Code: [email or web address] ?URL: [email or web address] ?Date: 08/23/2021 ?Prepared by: Octavio Manns ? ?Exercises ?Standing Shoulder Row with Anchored Resistance - 1 x daily - 7 x weekly - 3 sets - 10 reps ?Seated Hip Abduction with Resistance - 1 x daily - 7 x weekly - 3 sets - 10 reps ?Seated March with Resistance - 1 x daily - 7 x weekly - 2 sets - 20 reps ?Seated Long Arc Quad - 1 x daily - 7 x weekly - 3 sets - 10 reps ?Seated Shoulder Horizontal Abduction with Resistance - Palms Down - 1 x daily - 7 x weekly - 2 sets - 10 reps ?  ?  ?ASSESSMENT: ?*** ? ?Patient presents to PT with moderate levels of pain and does not report any recent falls. Patient has noticeable swelling in her left LE that she plans to speak with her endocrinologist about this week.She was able to complete all prescribed exercises until the end of the session where she became lightheaded due to her blood  sugar levels dropping, provided patient with ginger-ale and after a few minutes she began to feel better. Patient continues to benefit from skilled PT services and should be progressed as able to improv

## 2021-09-28 NOTE — Therapy (Signed)
?OUTPATIENT PHYSICAL THERAPY TREATMENT NOTE/ RE-ASSESSMENT ? ? ?Patient Name: Melinda [name] ?MRN: [id number] ?DOB:[birth year], 56 y.o., female ?Today's Date: 09/28/2021 ? ?PCP: [name], MD ?REFERRING PROVIDER: Jessy Oto, MD ? ? PT End of Session - 09/28/21 1253   ? ? Visit Number 9   ? Number of Visits 17   ? Date for PT Re-Evaluation 11/23/21   ? Authorization Type UHC MCD   ? Authorization - Visit Number 9   ? Authorization - Number of Visits 27   ? PT Start Time 8250   ? PT Stop Time 1340   ? PT Time Calculation (min) 45 min   ? Activity Tolerance Patient limited by pain;Patient tolerated treatment well   ? Behavior During Therapy [facility] for tasks assessed/performed   ? ?  ?  ? ?  ? ? ? ? ? ? ? ? ? ?Past Medical History:  ?Diagnosis Date  ? ADHD (attention deficit hyperactivity disorder)   ? Arthritis   ? Asthma   ? Cancer [facility])   ? Thyroid  ? Chicken pox AGE 31  ? COPD (chronic obstructive pulmonary disease) ([place])   ? pt reported  ? Diabetes mellitus without complication ([place])   ? Diverticulosis, sigmoid   ? GERD (gastroesophageal reflux disease)   ? Glaucoma   ? BOTH EYES, NO EYE DROPS  ? Glaucoma   ? History of blood transfusion 08/2014  ?  2UNITS GIVEN AND IRON GIVEN  ? Hyperglycemia 09/09/2014  ? Hypertension   ? 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 HAD TO LEARN TO WALK AGAIN  ? Iron deficiency anemia due to chronic blood loss 09/09/2014  ? Low TSH level 09/08/2014  ? Lumbar herniated disc   ? Menorrhagia 09/08/2014  ? Migraine   ? CLUSTER AND MIGRAINES  ? Multiple thyroid nodules   ? Ovarian mass, right 09/09/2014  ? PE (pulmonary embolism) 2016  ? Peripheral neuropathy   ? FINGER TIPS AND TOES NUMB SOME  ? Preeclampsia 1983  ? WITH PREGNANCY  ? PTSD (post-traumatic stress disorder)   ? Scoliosis   ? Seizures ([place]) AGE 53  ? NONE SINCE, HAD CHICKEN POX THEN  ? ?Past Surgical History:  ?Procedure Laterality Date  ? ANGIOGRAM TO LEG  08-13-15  ? RIGHT  ?  CHOLECYSTECTOMY    ? IR GENERIC HISTORICAL  04/04/2016  ? IR RADIOLOGIST EVAL & MGMT 04/04/2016 [name], MD [facility] INTERV RAD  ? RADIOLOGY WITH ANESTHESIA N/A 07/17/2021  ? Procedure: MRI LUMBAR SPINE WITHOUT CONTRAST; MRI CERVICAL SPINE WITHOUT CONTRAST WITH ANESTHESIA;  Surgeon: Radiologist, Medication, MD;  Location: [place];  Service: Radiology;  Laterality: N/A;  ? THYROIDECTOMY N/A 11/17/2015  ? Procedure: TOTAL THYROIDECTOMY;  Surgeon: Armandina Gemma, MD;  Location: [facility];  Service: General;  Laterality: N/A;  ? UTERINE ARTERY EMBOLIZATION Bilateral 08/13/2015  ? ?Patient Active Problem List  ? Diagnosis Date Noted  ? 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 Abdominal pannus 08/21/2021  ? Aortic atherosclerosis ([place]) 08/21/2021  ? At high risk for falls 04/20/2020  ? Hyperlipidemia 02/16/2020  ? Adrenal mass, right ([place]) 04/29/2019  ? Chronic cough 03/12/2018  ? Left shoulder pain 10/14/2016  ? Hypertension 10/14/2016  ? S/P thyroidectomy 06/27/2016  ? Chronic bilateral low back pain without sciatica 04/22/2016  ? Liver lesion 12/24/2015  ? Controlled type 2 diabetes mellitus with complication, with long-term current use of insulin ([place]) 10/10/2015  ?  Vision loss 08/25/2015  ? Decreased vision in both eyes 08/25/2015  ? Prolonged Q-T interval on ECG 08/19/2015  ? History of pulmonary embolism 08/10/2015  ? Mild persistent asthma 04/13/2015  ? Fibroid, uterine   ? Morbid obesity ([place]) 11/11/2014  ? Atypical chest pain 11/08/2014  ? Sinus tachycardia 11/08/2014  ? Migraine 10/14/2014  ? Menorrhagia 09/08/2014  ? ? ?REFERRING DIAG:  ?M48.02 (ICD-10-CM) - Spinal stenosis of cervical region ?M47.26 (ICD-10-CM) - Other spondylosis with radiculopathy, lumbar region ?M50.30 (ICD-10-CM) - Degenerative disc disease, cervical ?M47.812 (ICD-10-CM) - Spondylosis of cervical spine ?M77.8 (ICD-10-CM) - Tendinitis of shoulder, unspecified laterality ? ?THERAPY DIAG:  ?Muscle weakness (generalized) ? ?Difficulty in  walking, not elsewhere classified ? ?Other abnormalities of gait and mobility ? ?Unsteadiness on feet ? ?Chronic right shoulder pain ? ?Radiculopathy, cervical region ? ?Cramp and spasm ? ?Stiffness of neck ? ?Abnormal posture ? ?PERTINENT HISTORY:  ?HTN, peripheral neuropathy, DM II, PE, thyroid cancer, COPD ? ?PRECAUTIONS: Fall ? ?SUBJECTIVE: Pt reports Lt LE swelling from the foot to the hip which has been her baseline recently. She also reports LBP today and dorsal Lt foot pain. She denies any current shoulder pain. She is set to receive an additional cortisone injection into her Lt hip soon. She reports doing her HEP every other day. ? ?Pain: ?Are you having pain? Yes ?NPRS: 4/10 LBP ?Pain location: bilateral knees ?PAIN TYPE: sharp ?Pain description: intermittent  ?Aggravating factors: prolonged standing, walking ?Relieving factors: rest ? ? ?OBJECTIVE:  ?*Unless otherwise noted, objective information collected previously* ?  ?PATIENT SURVEYS:  ?Modified Oswestry 86% disability; 09/28/2021: 66% disability ?Quick Dash 87% disability; 09/28/2021: 62% disability ?  ? CERVICAL AROM/PROM ?  ?A/PROM A/PROM (deg) ?08/06/2021 AROM ?09/28/2021  ?Right rotation 43 p! 42 minor pain  ?Left rotation 35 48 minor pain  ? (Blank rows = not tested) ?  ?UE AROM/PROM: ?  ?A/PROM Right ?08/06/2021 Left ?08/06/2021 Right ?09/28/2021  ?Shoulder flexion 82 WFL 150 p! At end range  ?Shoulder extension       ?Shoulder abduction 75 WFL 128 p! At end range  ?Shoulder adduction       ?Shoulder extension       ?Shoulder internal rotation 15 WFL 70  ?Shoulder external rotation 20 WFL 15  ?Elbow flexion       ?Elbow extension       ?Wrist flexion       ?Wrist extension       ?Wrist ulnar deviation       ?Wrist radial deviation       ?Wrist pronation       ?Wrist supination       ? (Blank rows = not tested) ?  ?UE MMT: ?  ?MMT Right ?08/06/2021 Left ?08/06/2021 Right ?09/28/2021 Left ?09/28/2021  ?Shoulder flexion 2+/5 3+/5 4/5 4/5  ?Shoulder abduction 2+/5  3+/5 4/5 4/5  ?Shoulder internal rotation 3/5 3+/5 5/5 5/5  ?Shoulder external rotation 35 3+/5 4/5 4/5  ?Grip strength 55# 54#    ? (Blank rows = not tested) ?  ?LE MMT: ?  ?MMT Right ?08/06/2021 Left ?08/06/2021 Right ?09/14/2021 Left ?09/14/2021  ?Hip flexion 3+/5 2+/5 4/5 3/5  ?Hip abduction 3+/5 2+/5    ?Hip adduction 3+/5 3/5    ?Knee flexion 4/5 3/5 4+/5 3+/5  ?Knee extension 4/5 3/5 4/5 3+/5  ? (Blank rows = not tested) ?  ?Orthostatics: ?        Sitting: 114/88 ?  Standing: 114/84  ?  ?FUNCTIONAL TESTS:  ?30 Second Sit to Stand: 2 reps ?09/12/2021: 10 reps with UE support ?  ? ?TODAY'S TREATMENT:  ? ?[facility] Adult PT Treatment:                                                DATE: 09/28/2021 ?Therapeutic Exercise: ?5# kettlebell dead lift in front of table 3x8 ?Mini-squat side steps with YTB around ankles in // bars x2 laps ?Seated marching with RTB around thighs 2x10 BIL ?Seated LAQ with YTB 2x10 BIL ?Manual Therapy: ?N/A ?Neuromuscular re-ed: ?N/A ?Therapeutic Activity: ?Re-assessment of objective measures and re-administration of Quick-DASH and ODI with pt education on progress made in PT, as well as updated POC ?Education on efficacy of aquatic therapy ?Modalities: ?N/A ?Self Care: ?N/A ? ? ?[facility] Adult PT Treatment:                                                DATE: 09/18/2021 ?Therapeutic Exercise: ?LAQ 3x15 4# ?Seated march 3x20 4# ?Seated hamstring curl 2x15 GTB ?Seated heel/toe raise 3x15 ?Seated hip adduction ball squeeze 5" hold x10 (pt doesn't enjoy due to body habitus) ?Seated shoulder flexion AROM with dowel 2x10 ?Seated Row 3x10 BlueTB ?Seated horizontal abduction GTB 2 x 15 (small range to prevent pain in R shoulder) ?Seated bilateral ER GTB 2x15 ? ? ?[facility] Adult PT Treatment:                                                DATE: 09/14/2021 ?Therapeutic Exercise: ?LAQ 3x15 4# ?Seated march 3x20 4# ?Seated hamstring curl 3x10 GTB ?Seated heel/toe raise 2x15 ?Seated hip adduction ball squeeze  2x15 ?Seated Row 3x10 BlueTB ?Seated horizontal abduction GTB (pain in anterior right shoulder, terminated exercise) ?Seated bilateral ER GTB 2x10 ?STS x 10 - with UE support and elevated table ? ? ? ?  ?PATIENT EDU

## 2021-09-28 NOTE — Telephone Encounter (Signed)
Disregard. Patient arrived at clinic right after voicemail was left about missed appointment.  ?

## 2021-09-29 MED ORDER — TRIAMCINOLONE ACETONIDE 0.1 % EX CREA: 1.0000 "application " | TOPICAL_CREAM | Freq: Two times a day (BID) | CUTANEOUS | 1 refills | Status: DC

## 2021-09-29 MED ORDER — SYNTHROID 200 MCG PO TABS: 200.0000 ug | ORAL_TABLET | Freq: Every day | ORAL | 4 refills | Status: DC

## 2021-09-29 MED ORDER — TRULICITY 0.75 MG/0.5ML ~~LOC~~ SOAJ: 1.5000 mg | SUBCUTANEOUS | 6 refills | Status: DC

## 2021-09-29 MED ORDER — VITAMIN D3 10 MCG (400 UNIT) PO CAPS: 400.0000 [IU]/d | ORAL_CAPSULE | Freq: Every day | ORAL | 2 refills | Status: DC

## 2021-10-01 ENCOUNTER — Other Ambulatory Visit: Payer: Self-pay | Admitting: Obstetrics and Gynecology

## 2021-10-01 DIAGNOSIS — F4325 Adjustment disorder with mixed disturbance of emotions and conduct: Secondary | ICD-10-CM | POA: Diagnosis not present

## 2021-10-01 DIAGNOSIS — F4310 Post-traumatic stress disorder, unspecified: Secondary | ICD-10-CM | POA: Diagnosis not present

## 2021-10-01 NOTE — Patient Outreach (Signed)
Medicaid Managed Care   Nurse Care Manager Note  10/01/2021 Name:  Melinda [name] MRN:  [id number] DOB:  11-Mar-1966  Melinda [name] is an 56 y.o. year old female who is a primary patient of [name], MD.  The [facility] Managed Care Coordination team was consulted for assistance with:    Chronic healthcare management needs, DM, asthma, HTN, migraines, anxiety/depression, chronic pain  Melinda [name] was given information about Medicaid Managed Care Coordination team services today. Francoise Ceo Patient agreed to services and verbal consent obtained.  Engaged with patient by telephone for follow up visit in response to provider referral for case management and/or care coordination services.   Assessments/Interventions:  Review of past medical history, allergies, medications, health status, including review of consultants reports, laboratory and other test data, was performed as part of comprehensive evaluation and provision of chronic care management services.  SDOH (Social Determinants of Health) assessments and interventions performed: SDOH Interventions    Flowsheet Row Most Recent Value  SDOH Interventions   Financial Strain Interventions Intervention Not Indicated      Care Plan  Allergies  Allergen Reactions   Ceftriaxone Anaphylaxis    ROCEPHIN   Metformin Anaphylaxis    ALL   Penicillins Shortness Of Breath    Has patient had a PCN reaction causing immediate rash, facial/tongue/throat swelling, SOB or lightheadedness with hypotension: Yes Has patient had a PCN reaction causing severe rash involving mucus membranes or skin necrosis: No Has patient had a PCN reaction that required hospitalization No Has patient had a PCN reaction occurring within the last 10 years: No If all of the above answers are "NO", then may proceed with Cephalosporin use.    Shellfish Allergy Anaphylaxis   Flonase [Fluticasone Propionate]     Makes migraines worse   Gold-Containing Drug Products      HANDS ITCH   Nickel     HANDS SWELL   Citrus Rash   Medications Reviewed Today     Reviewed by [name], RN (Registered Nurse) on 10/01/21 at 1506  Med List Status: <None>   Medication Order Taking? Sig Documenting Provider Last Dose Status Informant  acetaminophen (TYLENOL) 500 MG tablet [id number] No Take 1 tablet (500 mg total) by mouth every 6 (six) hours as needed. Kerrin Champagne, MD Taking Active   albuterol (PROVENTIL) (2.5 MG/3ML) 0.083% nebulizer solution [id number] No Take 3 mLs (2.5 mg total) by nebulization every 6 (six) hours as needed for wheezing or shortness of breath. [name], MD Taking Active Self  albuterol (VENTOLIN HFA) 108 (90 Base) MCG/ACT inhaler [id number] No Inhale 2 puffs into the lungs every 6 (six) hours as needed for wheezing or shortness of breath. [name], MD Taking Active Self  atorvastatin (LIPITOR) 20 MG tablet [id number]  TAKE ONE TABLET BY MOUTH ONCE [name] AT BEDTIME [name], MD  Active   budesonide-formoterol [facility]) 160-4.5 MCG/ACT inhaler [id number]  Inhale 2 puffs into the lungs 2 (two) times [name]. [name], MD  Active   buPROPion (WELLBUTRIN XL) 300 MG 24 hr tablet [id number] No Take 300 mg by mouth [name].  [provider] Taking Active Self  busPIRone (BUSPAR) 10 MG tablet [id number] No Take 10 mg by mouth 2 (two) times [name]. [provider] Taking Active Self           Med Note [name], [facility] I   Wed Feb 18, 2018 10:03 PM)    Cholecalciferol (VITAMIN D3) 10 MCG (400 UNIT)  CAPS [id number]  Take 400 Int'l Units/day by mouth [name]. [name], MD  Active   citalopram (CELEXA) 40 MG tablet [id number] No Take 40 mg by mouth [name].  [provider] Taking Active Self  cyclobenzaprine (FLEXERIL) 10 MG tablet [id number]  TAKE 1 TABLET (10 MG TOTAL) BY MOUTH 3 (THREE) TIMES [name] AS NEEDED FOR MUSCLE SPASMS. [name], MD  Active   doxepin (SINEQUAN) 50 MG capsule  [id number] No Take 150 mg by mouth at bedtime. [provider] Taking Active Self  ELIQUIS 5 MG TABS tablet [id number]  TAKE ONE TABLET BY MOUTH TWICE [name] MORNING AND EVENING [name], MD  Active   Erenumab-aooe (AIMOVIG) 140 MG/ML SOAJ [id number] No Inject 140 mg into the skin every 28 (twenty-eight) days. [name], DO Taking Active Self  gabapentin (NEURONTIN) 300 MG capsule [id number]  TAKE ONE CAPSULE BY MOUTH THREE TIMES [name] (AM+NOON+BEDTIME) [name], MD  Active   hydrochlorothiazide (MICROZIDE) 12.5 MG capsule [id number]  TAKE ONE CAPSULE BY MOUTH ONCE [name](AM) [name], MD  Active   metoprolol tartrate (LOPRESSOR) 50 MG tablet [id number]  TAKE ONE TABLET BY MOUTH TWICE [name] (AM+BEDTIME) [name], MD  Active   montelukast (SINGULAIR) 10 MG tablet [id number]  TAKE 1 TABLET BY MOUTH EVERY EVENING AT BEDTIME [name], MD  Active   prazosin (MINIPRESS) 2 MG capsule [id number] No Take 6 mg by mouth at bedtime. [provider] Taking Active Self  sodium chloride (OCEAN) 0.65 % SOLN nasal spray [id number] No Place 1 spray into both nostrils as needed for congestion. [name], MD Taking Active Self  SYNTHROID 200 MCG tablet [id number]  Take 1 tablet (200 mcg total) by mouth [name] before breakfast. [name], MD  Active   topiramate (TOPAMAX) 200 MG tablet [id number] No TAKE 1 TABLET (200 MG TOTAL) BY MOUTH AT BEDTIME. [name], DO Taking Active Self  triamcinolone cream (KENALOG) 0.1 % [id number]  Apply 1 application. topically 2 (two) times [name]. [name], MD  Active   TRULICITY 0.75 MG/0.5ML Namon Cirri [id number]  Inject 1.5 mg into the skin once a week. [name], MD  Active            Patient Active Problem List   Diagnosis Date Noted   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   Abdominal pannus 08/21/2021   Aortic atherosclerosis (HCC) 08/21/2021   At high risk for falls 04/20/2020    Hyperlipidemia 02/16/2020   Adrenal mass, right ([facility]CC) 04/29/2019   Chronic cough 03/12/2018   Left shoulder pain 10/14/2016   Hypertension 10/14/2016   S/P thyroidectomy 06/27/2016   Chronic bilateral low back pain without sciatica 04/22/2016   Liver lesion 12/24/2015   Controlled type 2 diabetes mellitus with complication, with long-term current use of insulin (HCC) 10/10/2015   Vision loss 08/25/2015   Decreased vision in both eyes 08/25/2015   Prolonged Q-T interval on ECG 08/19/2015   History of pulmonary embolism 08/10/2015   Mild persistent asthma 04/13/2015   Fibroid, uterine    Morbid obesity (HCC) 11/11/2014   Atypical chest pain 11/08/2014   Sinus tachycardia 11/08/2014   Migraine 10/14/2014   Menorrhagia 09/08/2014  Conditions to be addressed/monitored per PCP order:  Chronic healthcare management needs, DM, asthma, HTN, migraines, anxiety/depression, chronic pain  Care Plan : General Plan of Care (Adult)  Updates made by [name], RN since 10/01/2021 12:00 AM  Problem: Health Promotion or Disease Self-Management (General Plan of Care) Resolved 10/01/2021  Priority: Medium  Onset Date: 07/14/2020     Care Plan : General Plan of Care (Adult)  Updates made by [name], RN since 10/01/2021 12:00 AM     Problem: Health Promotion or Disease Self-Management (General Plan of Care)   Priority: Medium  Onset Date: 10/20/2020     Long-Range Goal: Self-Management Plan Developed   Start Date: 07/14/2020  Expected End Date: 12/31/2021  Recent Progress: On track  Priority: Medium  Note:    Current Barriers:  Chronic Disease Management support and education needs Patient with anxiety and depression-has appointment with Psychiatrist  scheduled every 3 months, therapist every 2 weeks 10/01/21:  No change-to have injection for left hip-is continuing PT once a week   Nurse Case Manager Clinical Goal(s):  Over the next 90 days, patient will attend all scheduled medical  appointments:  Over the next 30 days, patient will work with CM team pharmacist to review medications. Interventions:  Inter-disciplinary care team collaboration (see longitudinal plan of care) Evaluation of current treatment plan  and patient's adherence to plan as established by provider. Reviewed medications with patient. Collaborated with pharmacy regarding medications. Discussed plans with patient for ongoing care management follow up and provided patient with direct contact information for care management team Reviewed scheduled/upcoming provider appointments. Collaborated with SW for psychotherapy referral. SW referral for psychotherapy provider-completed Pharmacy referral for medication review. Self Care Activities: Over the next 90 days, patient will:  -Self administers medications as prescribed Attends all scheduled provider appointments Calls pharmacy for medication refills Calls provider office for new concerns or questions  Follow Up Plan: The Managed Medicaid care management team will reach out to the patient again over the next 30 days.  The patient has been provided with contact information for the Managed Medicaid care management team and has been advised to call with any health related questions or concerns.    Follow Up:  Patient agrees to Care Plan and Follow-up.  Plan: The Managed Medicaid care management team will reach out to the patient again over the next 30 days. and The  Patient has been provided with contact information for the Managed Medicaid care management team and has been advised to call with any health related questions or concerns.  Date/time of next scheduled RN care management/care coordination outreach:  11/06/21 at 115

## 2021-10-01 NOTE — Patient Instructions (Signed)
Hi Melinda [name] for speaking with me-I hope you have a nice afternoon! ? ?Melinda [name] was given information about Medicaid Managed Care team care coordination services as a part of their [place] Medicaid benefit. Melinda [name] verbally consented to engagement with the [facility] Managed Care team.  ? ?If you are experiencing a medical emergency, please call 911 or report to your local emergency department or urgent care.  ? ?If you have a non-emergency medical problem during routine business hours, please contact your provider's office and ask to speak with a nurse.  ? ?For questions related to your [facility], please call: [phone] or visit the homepage here: [email or web address] ? ?If you would like to schedule transportation through your [facility], please call the following number at least 2 days in advance of your appointment: [phone]. ? Rides for urgent appointments can also be made after hours by calling Member Services. ? ?Call the [place] at [phone], at any time, 24 hours a day, 7 days a week. If you are in danger or need immediate medical attention call 911. ? ?If you would like help to quit smoking, call 1-800-QUIT-NOW (305) 795-1848) OR Espa?ol: 1-855-D?jelo-Ya 708-358-1509) o para m?s informaci?n haga clic aqu? or Text READY to 200-400 to register via text ? ?Melinda [name] - following are the goals we discussed in your visit today:  ? Goals Addressed   ? ?  ?  ?  ?  ? This Visit's Progress  ?  Protect My Health     ?   ? ?Timeframe:  Long-Range Goal ?Priority:  Medium ?Start Date:    07/14/20                         ?Expected End Date:  ongoing         ? ?Follow Up Date: 10/31/21 ?- schedule appointment for vaccines needed due to my age or health ?- schedule recommended health tests (blood work, mammogram, colonoscopy, pap test) ?- schedule and keep  appointment for annual check-up  ? ?10/01/21:  Patient continues PT once a week.  Recent appointments with Dr. Buddy Duty and Dr. [name] and eye provider.  Has upcoming appts with Podiatry and PCP. ?  ? ?Patient verbalizes understanding of instructions and care plan provided today and agrees to view in [place]. Active MyChart status confirmed with patient.   ? ?The Managed Medicaid care management team will reach out to the patient again over the next 30 days.  ?The  Patient   has been provided with contact information for the Managed Medicaid care management team and has been advised to call with any health related questions or concerns.  ? ?Melinda Raider RN, BSN ?[facility] ?Care Management Coordinator - Managed Medicaid High Risk ?[phone] ?  ?Following is a copy of your plan of care:  ?Care Plan : General Plan of Care (Adult)  ?Updates made by Gayla Medicus, RN since 10/01/2021 12:00 AM  ?  ? ?Problem: Health Promotion or Disease Self-Management (General Plan of Care) Resolved 10/01/2021  ?Priority: Medium  ?Onset Date: 07/14/2020  ?  ? ?Care Plan : General Plan of Care (Adult)  ?Updates made by Gayla Medicus, RN since 10/01/2021 12:00 AM  ?  ? ?Problem: Health Promotion or Disease Self-Management (General Plan of Care)   ?Priority: Medium  ?Onset Date: 10/20/2020  ?  ? ?Long-Range Goal: Self-Management Plan  Developed   ?Start Date: 07/14/2020  ?Expected End Date: 12/31/2021  ?Recent Progress: On track  ?Priority: Medium  ?Note:   ? ?Current Barriers:  ?Chronic Disease Management support and education needs ?Patient with anxiety and depression-has appointment with Psychiatrist  scheduled every 3 months, therapist every 2 weeks ?10/01/21:  No change-to have injection for left hip-is continuing PT once a week   ?Nurse Case Manager Clinical Goal(s):  ?Over the next 90 days, patient will attend all scheduled medical appointments:  ?Over the next 30 days, patient will work with CM team pharmacist to review  medications. ?Interventions:  ?Inter-disciplinary care team collaboration (see longitudinal plan of care) ?Evaluation of current treatment plan  and patient's adherence to plan as established by provider. ?Reviewed medications with patient. ?Collaborated with pharmacy regarding medications. ?Discussed plans with patient for ongoing care management follow up and provided patient with direct contact information for care management team ?Reviewed scheduled/upcoming provider appointments. ?Collaborated with SW for psychotherapy referral. ?SW referral for psychotherapy provider-completed ?Pharmacy referral for medication review. ?Self Care Activities: ?Over the next 90 days, patient will: ? -Self administers medications as prescribed ?Attends all scheduled provider appointments ?Calls pharmacy for medication refills ?Calls provider office for new concerns or questions ? ?Follow Up Plan: The Managed Medicaid care management team will reach out to the patient again over the next 30 days.  ?The patient has been provided with contact information for the Managed Medicaid care management team and has been advised to call with any health related questions or concerns.   ?  ?

## 2021-10-04 ENCOUNTER — Other Ambulatory Visit (HOSPITAL_COMMUNITY): Payer: Self-pay

## 2021-10-04 ENCOUNTER — Ambulatory Visit: Payer: Medicaid Other | Attending: Specialist

## 2021-10-04 ENCOUNTER — Telehealth: Payer: Self-pay

## 2021-10-04 DIAGNOSIS — M25511 Pain in right shoulder: Secondary | ICD-10-CM | POA: Diagnosis present

## 2021-10-04 DIAGNOSIS — M6281 Muscle weakness (generalized): Secondary | ICD-10-CM | POA: Diagnosis present

## 2021-10-04 DIAGNOSIS — R262 Difficulty in walking, not elsewhere classified: Secondary | ICD-10-CM | POA: Insufficient documentation

## 2021-10-04 DIAGNOSIS — G8929 Other chronic pain: Secondary | ICD-10-CM | POA: Diagnosis present

## 2021-10-04 NOTE — Telephone Encounter (Signed)
Patient Advocate Encounter ? ?Prior Authorization for [employer] '120mg'$ /ml auto-injector has been approved.   ? ?PA# [id number] ? ?Effective dates: 10/04/21 through 01/03/22 ? ?Per Test Claim Patients co-pay is $4.  ? ?Spoke with Pharmacy to Process. ? ?Patient Advocate ?Fax: [phone]  ?

## 2021-10-04 NOTE — Telephone Encounter (Signed)
Fax received [employer] denied Appeal for Aimovig mg  ?

## 2021-10-04 NOTE — Telephone Encounter (Signed)
Messaged PA Team to start a PA for Emgality per Dr.Jaffe.  ?

## 2021-10-04 NOTE — Therapy (Signed)
?OUTPATIENT PHYSICAL THERAPY TREATMENT NOTE ? ? ?Patient Name: Melinda [name] ?MRN: [id number] ?DOB:[birth year], 56 y.o., female ?Today's Date: 10/04/2021 ? ?PCP: [name], MD ?REFERRING PROVIDER: Jessy Oto, MD ? ? ? 10/04/21 1211  ?PT Visits / Re-Eval  ?Visit Number 10  ?Number of Visits 17  ?Date for PT Re-Evaluation 11/23/21  ?Authorization  ?Authorization Type UHC MCD  ?Authorization - Visit Number 9  ?Authorization - Number of Visits 27  ?PT Time Calculation  ?PT Start Time 1215  ?PT Stop Time 1300  ?PT Time Calculation (min) 45 min  ?PT - End of Session  ?Activity Tolerance Patient limited by pain;Patient tolerated treatment well  ?Behavior During Therapy WFL for tasks assessed/performed  ?` ? ? ? ? ? ? ? ? ? ?Past Medical History:  ?Diagnosis Date  ? ADHD (attention deficit hyperactivity disorder)   ? Arthritis   ? Asthma   ? Cancer [facility])   ? Thyroid  ? Chicken pox AGE 48  ? COPD (chronic obstructive pulmonary disease) ([place])   ? pt reported  ? Diabetes mellitus without complication ([place])   ? Diverticulosis, sigmoid   ? GERD (gastroesophageal reflux disease)   ? Glaucoma   ? BOTH EYES, NO EYE DROPS  ? Glaucoma   ? History of blood transfusion 08/2014  ?  2UNITS GIVEN AND IRON GIVEN  ? Hyperglycemia 09/09/2014  ? Hypertension   ? 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 HAD TO LEARN TO WALK AGAIN  ? Iron deficiency anemia due to chronic blood loss 09/09/2014  ? Low TSH level 09/08/2014  ? Lumbar herniated disc   ? Menorrhagia 09/08/2014  ? Migraine   ? CLUSTER AND MIGRAINES  ? Multiple thyroid nodules   ? Ovarian mass, right 09/09/2014  ? PE (pulmonary embolism) 2016  ? Peripheral neuropathy   ? FINGER TIPS AND TOES NUMB SOME  ? Preeclampsia 1983  ? WITH PREGNANCY  ? PTSD (post-traumatic stress disorder)   ? Scoliosis   ? Seizures ([place]) AGE 44  ? NONE SINCE, HAD CHICKEN POX THEN  ? ?Past Surgical History:  ?Procedure Laterality Date  ? ANGIOGRAM TO LEG  08-13-15   ? RIGHT  ? CHOLECYSTECTOMY    ? IR GENERIC HISTORICAL  04/04/2016  ? IR RADIOLOGIST EVAL & MGMT 04/04/2016 [name], MD [facility] INTERV RAD  ? RADIOLOGY WITH ANESTHESIA N/A 07/17/2021  ? Procedure: MRI LUMBAR SPINE WITHOUT CONTRAST; MRI CERVICAL SPINE WITHOUT CONTRAST WITH ANESTHESIA;  Surgeon: Radiologist, Medication, MD;  Location: [place];  Service: Radiology;  Laterality: N/A;  ? THYROIDECTOMY N/A 11/17/2015  ? Procedure: TOTAL THYROIDECTOMY;  Surgeon: Armandina Gemma, MD;  [facility];  Service: General;  Laterality: N/A;  ? UTERINE ARTERY EMBOLIZATION Bilateral 08/13/2015  ? ?Patient Active Problem List  ? Diagnosis Date Noted  ? 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 Abdominal pannus 08/21/2021  ? Aortic atherosclerosis ([place]) 08/21/2021  ? At high risk for falls 04/20/2020  ? Hyperlipidemia 02/16/2020  ? Adrenal mass, right ([place]) 04/29/2019  ? Chronic cough 03/12/2018  ? Left shoulder pain 10/14/2016  ? Hypertension 10/14/2016  ? S/P thyroidectomy 06/27/2016  ? Chronic bilateral low back pain without sciatica 04/22/2016  ? Liver lesion 12/24/2015  ? Controlled type 2 diabetes mellitus with complication, with long-term current use of insulin ([place]) 10/10/2015  ? Vision loss 08/25/2015  ? Decreased vision in both eyes 08/25/2015  ? Prolonged Q-T interval on ECG 08/19/2015  ?  History of pulmonary embolism 08/10/2015  ? Mild persistent asthma 04/13/2015  ? Fibroid, uterine   ? Morbid obesity ([place]) 11/11/2014  ? Atypical chest pain 11/08/2014  ? Sinus tachycardia 11/08/2014  ? Migraine 10/14/2014  ? Menorrhagia 09/08/2014  ? ? ?REFERRING DIAG:  ?M48.02 (ICD-10-CM) - Spinal stenosis of cervical region ?M47.26 (ICD-10-CM) - Other spondylosis with radiculopathy, lumbar region ?M50.30 (ICD-10-CM) - Degenerative disc disease, cervical ?M47.812 (ICD-10-CM) - Spondylosis of cervical spine ?M77.8 (ICD-10-CM) - Tendinitis of shoulder, unspecified laterality ? ?THERAPY DIAG:  ?No diagnosis found. ? ?PERTINENT HISTORY:   ?HTN, peripheral neuropathy, DM II, PE, thyroid cancer, COPD ? ?PRECAUTIONS: Fall ? ?SUBJECTIVE: Reports incurring several falls last night as she was stocking her fridge and lost balance describes some R rib and shoulder pain ?Pain: ?Are you having pain? Yes ?NPRS: 4/10 LBP ?Pain location: bilateral knees ?PAIN TYPE: sharp ?Pain description: intermittent  ?Aggravating factors: prolonged standing, walking ?Relieving factors: rest ? ? ?OBJECTIVE:  ?*Unless otherwise noted, objective information collected previously* ?  ?PATIENT SURVEYS:  ?Modified Oswestry 86% disability; 09/28/2021: 66% disability ?Quick Dash 87% disability; 09/28/2021: 62% disability ?  ? CERVICAL AROM/PROM ?  ?A/PROM A/PROM (deg) ?08/06/2021 AROM ?09/28/2021  ?Right rotation 43 p! 42 minor pain  ?Left rotation 35 48 minor pain  ? (Blank rows = not tested) ?  ?UE AROM/PROM: ?  ?A/PROM Right ?08/06/2021 Left ?08/06/2021 Right ?09/28/2021  ?Shoulder flexion 82 WFL 150 p! At end range  ?Shoulder extension       ?Shoulder abduction 75 WFL 128 p! At end range  ?Shoulder adduction       ?Shoulder extension       ?Shoulder internal rotation 15 WFL 70  ?Shoulder external rotation 20 WFL 15  ?Elbow flexion       ?Elbow extension       ?Wrist flexion       ?Wrist extension       ?Wrist ulnar deviation       ?Wrist radial deviation       ?Wrist pronation       ?Wrist supination       ? (Blank rows = not tested) ?  ?UE MMT: ?  ?MMT Right ?08/06/2021 Left ?08/06/2021 Right ?09/28/2021 Left ?09/28/2021  ?Shoulder flexion 2+/5 3+/5 4/5 4/5  ?Shoulder abduction 2+/5 3+/5 4/5 4/5  ?Shoulder internal rotation 3/5 3+/5 5/5 5/5  ?Shoulder external rotation 35 3+/5 4/5 4/5  ?Grip strength 55# 54#    ? (Blank rows = not tested) ?  ?LE MMT: ?  ?MMT Right ?08/06/2021 Left ?08/06/2021 Right ?09/14/2021 Left ?09/14/2021  ?Hip flexion 3+/5 2+/5 4/5 3/5  ?Hip abduction 3+/5 2+/5    ?Hip adduction 3+/5 3/5    ?Knee flexion 4/5 3/5 4+/5 3+/5  ?Knee extension 4/5 3/5 4/5 3+/5  ? (Blank rows = not  tested) ?  ?Orthostatics: ?        Sitting: 114/88 ?           Standing: 114/84  ?  ?FUNCTIONAL TESTS:  ?30 Second Sit to Stand: 2 reps ?09/12/2021: 10 reps with UE support ?  ? ?TODAY'S TREATMENT:  ?[facility] Adult PT Treatment:                                                DATE: 10/04/21 ?Therapeutic Exercise: ?LAQ 3x15 4# ?Seated march 3x20  4# ?Seated hamstring curl 2x15 GTB ?Seated heel/toe raise 3x15 ?Seated heel/toe on rocker board 2x15 ?Seated hip adduction ball squeeze 5" hold x10 (pt doesn't enjoy due to body habitus) ?Seated shoulder OH press holding tennis ball15x ?Seated Row 2x15 BlueTB ?Seated horizontal abduction GTB 2 x 15 (small range to prevent pain in R shoulder) ?Seated bilateral ER GTB 2x15 ?Seated elbow flexion B GTB 2x15 ?Seated B hip abduction GTB 15x ? ? ?[facility] Adult PT Treatment:                                                DATE: 09/28/2021 ?Therapeutic Exercise: ?5# kettlebell dead lift in front of table 3x8 ?Mini-squat side steps with YTB around ankles in // bars x2 laps ?Seated marching with RTB around thighs 2x10 BIL ?Seated LAQ with YTB 2x10 BIL ?Manual Therapy: ?N/A ?Neuromuscular re-ed: ?N/A ?Therapeutic Activity: ?Re-assessment of objective measures and re-administration of Quick-DASH and ODI with pt education on progress made in PT, as well as updated POC ?Education on efficacy of aquatic therapy ?Modalities: ?N/A ?Self Care: ?N/A ? ? ?[facility] Adult PT Treatment:                                                DATE: 09/18/2021 ?Therapeutic Exercise: ?LAQ 3x15 4# ?Seated march 3x20 4# ?Seated hamstring curl 2x15 GTB ?Seated heel/toe raise 3x15 ?Seated hip adduction ball squeeze 5" hold x10 (pt doesn't enjoy due to body habitus) ?Seated shoulder flexion AROM with dowel 2x10 ?Seated Row 3x10 BlueTB ?Seated horizontal abduction GTB 2 x 15 (small range to prevent pain in R shoulder) ?Seated bilateral ER GTB 2x15 ? ? ?[facility] Adult PT Treatment:                                                DATE:  09/14/2021 ?Therapeutic Exercise: ?LAQ 3x15 4# ?Seated march 3x20 4# ?Seated hamstring curl 3x10 GTB ?Seated heel/toe raise 2x15 ?Seated hip adduction ball squeeze 2x15 ?Seated Row 3x10 BlueTB ?Seated horizont

## 2021-10-04 NOTE — Telephone Encounter (Signed)
Patient Advocate Encounter ?  ?Received notification from patient calls that prior authorization for Emgality '120mg'$ /ml auto-injectors is required by his/her insurance [employer]. ?  ?PA submitted on 10/04/21 ? ?Key#: [id number] ? ?Status is pending ?   ?[facility] will continue to follow: ? ?Patient Advocate ?Fax: [phone]  ?

## 2021-10-09 ENCOUNTER — Telehealth: Payer: Self-pay | Admitting: Internal Medicine

## 2021-10-09 ENCOUNTER — Telehealth: Payer: Self-pay

## 2021-10-09 MED ORDER — EMGALITY 120 MG/ML ~~LOC~~ SOAJ: 240.0000 mg | Freq: Once | SUBCUTANEOUS | 0 refills | Status: AC

## 2021-10-09 MED ORDER — EMGALITY 120 MG/ML ~~LOC~~ SOAJ: 120.0000 mg | SUBCUTANEOUS | 5 refills | Status: DC

## 2021-10-09 NOTE — Telephone Encounter (Signed)
PA Approved for [employer] script sent to the pharmacy.  ?

## 2021-10-09 NOTE — Telephone Encounter (Signed)
Melinda [name] w/ [facility], calling to advise Dr Ernestina Patches will be doing an epidural interlaminar on pt's lower back. ([place]) on May 8.  ?Dr Ernestina Patches  would like permission from Dr Wynetta Emery for pt to stop her ELIQUIS 5 MG TABS tablet 7 days prior. (May 1) ? ?Ok to to put in media in her chart. ? ?Cb [phone] ? ? ?

## 2021-10-10 NOTE — Telephone Encounter (Signed)
Pt has been sent a MyChart message in regards to stopping her blood thinner for procedure  ?

## 2021-10-10 NOTE — Telephone Encounter (Signed)
Will forward to [facility] to make her aware.  ?

## 2021-10-11 ENCOUNTER — Ambulatory Visit: Payer: Medicaid Other

## 2021-10-11 DIAGNOSIS — M6281 Muscle weakness (generalized): Secondary | ICD-10-CM | POA: Diagnosis not present

## 2021-10-11 DIAGNOSIS — R262 Difficulty in walking, not elsewhere classified: Secondary | ICD-10-CM

## 2021-10-11 NOTE — Therapy (Signed)
?OUTPATIENT PHYSICAL THERAPY TREATMENT NOTE ? ? ?Patient Name: Melinda [name] ?MRN: [id number] ?DOB:[birth year], 56 y.o., female ?Today's Date: 10/11/2021 ? ?PCP: [name], MD ?REFERRING PROVIDER: Jessy Oto, MD ? ? PT End of Session - 10/11/21 1450   ? ? Visit Number 11   ? Number of Visits 17   ? Date for PT Re-Evaluation 11/23/21   ? Authorization Type UHC MCD   ? Authorization - Visit Number 9   ? Authorization - Number of Visits 27   ? PT Start Time 3244   ? PT Stop Time 1525   ? PT Time Calculation (min) 40 min   ? Activity Tolerance Patient limited by pain;Patient tolerated treatment well   ? Behavior During Therapy [facility] for tasks assessed/performed   ? ?  ?  ? ?  ? ? 10/04/21 1211  ?PT Visits / Re-Eval  ?Visit Number 10  ?Number of Visits 17  ?Date for PT Re-Evaluation 11/23/21  ?Authorization  ?Authorization Type UHC MCD  ?Authorization - Visit Number 9  ?Authorization - Number of Visits 27  ?PT Time Calculation  ?PT Start Time 1215  ?PT Stop Time 1300  ?PT Time Calculation (min) 45 min  ?PT - End of Session  ?Activity Tolerance Patient limited by pain;Patient tolerated treatment well  ?Behavior During Therapy WFL for tasks assessed/performed  ?` ? ? ? ? ? ? ? ? ? ?Past Medical History:  ?Diagnosis Date  ? ADHD (attention deficit hyperactivity disorder)   ? Arthritis   ? Asthma   ? Cancer [facility])   ? Thyroid  ? Chicken pox AGE 49  ? COPD (chronic obstructive pulmonary disease) ([place])   ? pt reported  ? Diabetes mellitus without complication ([place])   ? Diverticulosis, sigmoid   ? GERD (gastroesophageal reflux disease)   ? Glaucoma   ? BOTH EYES, NO EYE DROPS  ? Glaucoma   ? History of blood transfusion 08/2014  ?  2UNITS GIVEN AND IRON GIVEN  ? Hyperglycemia 09/09/2014  ? Hypertension   ? 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 HAD TO LEARN TO WALK AGAIN  ? Iron deficiency anemia due to chronic blood loss 09/09/2014  ? Low TSH level 09/08/2014  ? Lumbar herniated  disc   ? Menorrhagia 09/08/2014  ? Migraine   ? CLUSTER AND MIGRAINES  ? Multiple thyroid nodules   ? Ovarian mass, right 09/09/2014  ? PE (pulmonary embolism) 2016  ? Peripheral neuropathy   ? FINGER TIPS AND TOES NUMB SOME  ? Preeclampsia 1983  ? WITH PREGNANCY  ? PTSD (post-traumatic stress disorder)   ? Scoliosis   ? Seizures ([place]) AGE 61  ? NONE SINCE, HAD CHICKEN POX THEN  ? ?Past Surgical History:  ?Procedure Laterality Date  ? ANGIOGRAM TO LEG  08-13-15  ? RIGHT  ? CHOLECYSTECTOMY    ? IR GENERIC HISTORICAL  04/04/2016  ? IR RADIOLOGIST EVAL & MGMT 04/04/2016 [name], MD [facility] INTERV RAD  ? RADIOLOGY WITH ANESTHESIA N/A 07/17/2021  ? Procedure: MRI LUMBAR SPINE WITHOUT CONTRAST; MRI CERVICAL SPINE WITHOUT CONTRAST WITH ANESTHESIA;  Surgeon: Radiologist, Medication, MD;  Location: [place];  Service: Radiology;  Laterality: N/A;  ? THYROIDECTOMY N/A 11/17/2015  ? Procedure: TOTAL THYROIDECTOMY;  Surgeon: Armandina Gemma, MD;  [facility];  Service: General;  Laterality: N/A;  ? UTERINE ARTERY EMBOLIZATION Bilateral 08/13/2015  ? ?Patient Active Problem List  ? Diagnosis Date Noted  ?  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 Abdominal pannus 08/21/2021  ? Aortic atherosclerosis ([place]) 08/21/2021  ? At high risk for falls 04/20/2020  ? Hyperlipidemia 02/16/2020  ? Adrenal mass, right ([place]) 04/29/2019  ? Chronic cough 03/12/2018  ? Left shoulder pain 10/14/2016  ? Hypertension 10/14/2016  ? S/P thyroidectomy 06/27/2016  ? Chronic bilateral low back pain without sciatica 04/22/2016  ? Liver lesion 12/24/2015  ? Controlled type 2 diabetes mellitus with complication, with long-term current use of insulin ([place]) 10/10/2015  ? Vision loss 08/25/2015  ? Decreased vision in both eyes 08/25/2015  ? Prolonged Q-T interval on ECG 08/19/2015  ? History of pulmonary embolism 08/10/2015  ? Mild persistent asthma 04/13/2015  ? Fibroid, uterine   ? Morbid obesity ([place]) 11/11/2014  ? Atypical chest pain 11/08/2014  ? Sinus  tachycardia 11/08/2014  ? Migraine 10/14/2014  ? Menorrhagia 09/08/2014  ? ? ?REFERRING DIAG:  ?M48.02 (ICD-10-CM) - Spinal stenosis of cervical region ?M47.26 (ICD-10-CM) - Other spondylosis with radiculopathy, lumbar region ?M50.30 (ICD-10-CM) - Degenerative disc disease, cervical ?M47.812 (ICD-10-CM) - Spondylosis of cervical spine ?M77.8 (ICD-10-CM) - Tendinitis of shoulder, unspecified laterality ? ?THERAPY DIAG:  ?Muscle weakness (generalized) ? ?Difficulty in walking, not elsewhere classified ? ?PERTINENT HISTORY:  ?HTN, peripheral neuropathy, DM II, PE, thyroid cancer, COPD ? ?PRECAUTIONS: Fall ? ?SUBJECTIVE: no falls since last session, will undergo spinal injections next month by Dr. Ernestina Patches as well as f/u wih Dr. [name], unable to lie supine due to history of lightheadedness ? ?Pain: ?Are you having pain? Yes ?NPRS: 4/10 LBP, 5/10 neck and RUE  ?Pain location: bilateral knees ?PAIN TYPE: sharp ?Pain description: intermittent  ?Aggravating factors: prolonged standing, walking ?Relieving factors: rest ? ? ?OBJECTIVE:  ?*Unless otherwise noted, objective information collected previously* ?  ?PATIENT SURVEYS:  ?Modified Oswestry 86% disability; 09/28/2021: 66% disability ?Quick Dash 87% disability; 09/28/2021: 62% disability ?  ? CERVICAL AROM/PROM ?  ?A/PROM A/PROM (deg) ?08/06/2021 AROM ?09/28/2021  ?Right rotation 43 p! 42 minor pain  ?Left rotation 35 48 minor pain  ? (Blank rows = not tested) ?  ?UE AROM/PROM: ?  ?A/PROM Right ?08/06/2021 Left ?08/06/2021 Right ?09/28/2021  ?Shoulder flexion 82 WFL 150 p! At end range  ?Shoulder extension       ?Shoulder abduction 75 WFL 128 p! At end range  ?Shoulder adduction       ?Shoulder extension       ?Shoulder internal rotation 15 WFL 70  ?Shoulder external rotation 20 WFL 15  ?Elbow flexion       ?Elbow extension       ?Wrist flexion       ?Wrist extension       ?Wrist ulnar deviation       ?Wrist radial deviation       ?Wrist pronation       ?Wrist supination       ?  (Blank rows = not tested) ?  ?UE MMT: ?  ?MMT Right ?08/06/2021 Left ?08/06/2021 Right ?09/28/2021 Left ?09/28/2021  ?Shoulder flexion 2+/5 3+/5 4/5 4/5  ?Shoulder abduction 2+/5 3+/5 4/5 4/5  ?Shoulder internal rotation 3/5 3+/5 5/5 5/5  ?Shoulder external rotation 35 3+/5 4/5 4/5  ?Grip strength 55# 54#    ? (Blank rows = not tested) ?  ?LE MMT: ?  ?MMT Right ?08/06/2021 Left ?08/06/2021 Right ?09/14/2021 Left ?09/14/2021  ?Hip flexion 3+/5 2+/5 4/5 3/5  ?Hip abduction 3+/5 2+/5    ?Hip adduction 3+/5 3/5    ?  Knee flexion 4/5 3/5 4+/5 3+/5  ?Knee extension 4/5 3/5 4/5 3+/5  ? (Blank rows = not tested) ?  ?Orthostatics: ?        Sitting: 114/88 ?           Standing: 114/84  ?  ?FUNCTIONAL TESTS:  ?30 Second Sit to Stand: 2 reps ?09/12/2021: 10 reps with UE support ?  ? ?TODAY'S TREATMENT:  ?[facility] Adult PT Treatment:                                                DATE: 10/11/21 ?Therapeutic Exercise: ?LAQ 2x15 5# ?Seated march 2x15 5# ?Seated hamstring curl 2x15 GTB ?Seated heel/toe on rocker board 2x15 ?Seated hip adduction ball squeeze 5" hold x10 (pt doesn't enjoy due to body habitus) ?Seated shoulder OH press holding tennis ball 15x ?Seated Row 2x15 GTB ?Seated horizontal abduction GTB 2 x 15 (small range to prevent pain in R shoulder) ?Seated bilateral ER GTB 2x15 ?Seated elbow flexion B GTB 2x15 ?Seated B hip abduction GTB 15x2 ? ?[facility] Adult PT Treatment:                                                DATE: 10/04/21 ?Therapeutic Exercise: ?LAQ 3x15 4# ?Seated march 3x20 4# ?Seated hamstring curl 2x15 GTB ?Seated heel/toe raise 3x15 ?Seated heel/toe on rocker board 2x15 ?Seated hip adduction ball squeeze 5" hold x10 (pt doesn't enjoy due to body habitus) ?Seated shoulder OH press holding tennis ball15x ?Seated Row 2x15 BlueTB ?Seated horizontal abduction GTB 2 x 15 (small range to prevent pain in R shoulder) ?Seated bilateral ER GTB 2x15 ?Seated elbow flexion B GTB 2x15 ?Seated B hip abduction GTB 15x ? ? ?[facility] Adult PT  Treatment:                                                DATE: 09/28/2021 ?Therapeutic Exercise: ?5# kettlebell dead lift in front of table 3x8 ?Mini-squat side steps with YTB around ankles in // bars x2 laps ?Se

## 2021-10-11 NOTE — Patient Instructions (Signed)
Aquatic Therapy at [facility]-  What to Expect!  Where:   [facility] [address] [place], [place] [address] Rehab phone [phone]  NOTE:  You will receive an automated phone message reminding you of your appt and it will say the appointment is at the [address] [facility] [address] [facility] clinic.          How to Prepare: Please make sure you drink 8 ounces of water about one hour prior to your pool session A caregiver may attend if needed with the patient to help assist as needed. A caregiver can sit in the pool room on chair. Please arrive IN YOUR SUIT and 15 minutes prior to your appointment - this helps to avoid delays in starting your session. Please make sure to attend to any toileting needs prior to entering the pool Locker rooms for changing are provided.   There is direct access to the pool deck form the locker room.  You can lock your belongings in a locker with lock provided. Once on the pool deck your therapist will ask if you have signed the Patient  Consent and Assignment of Benefits form before beginning treatment Your therapist may take your blood pressure prior to, during and after your session if indicated We usually try and create a home exercise program based on activities we do in the pool.  Please be thinking about who might be able to assist you in the pool should you need to participate in an aquatic home exercise program at the time of discharge if you need assistance.  Some patients do not want to or do not have the ability to participate in an aquatic home program - this is not a barrier in any way to you participating in aquatic therapy as part of your current therapy plan! After Discharge from PT, you can continue using home program at  the [facility]/, there is a drop-in fee for $5 ($45 a month)or for 60 years  or older $4.00 ($40 a month for seniors ) or any local [facility] pool.  Memberships for purchase are  available for gym/pool at [facility]  IT IS VERY IMPORTANT THAT YOUR LAST VISIT BE IN THE CLINIC AT [address] AFTER YOUR LAST AQUATIC VISIT.  PLEASE MAKE SURE THAT YOU HAVE A [address]  APPOINTMENT SCHEDULED.   About the pool: Pool is located approximately 500 FT from the entrance of the building.  Please bring a support person if you need assistance traveling this      distance.   Your therapist will assist you in entering the water; there are two ways to           enter: stairs with railings, and a mechanical lift. Your therapist will determine the most appropriate way for you.  Water temperature is usually between 88-90 degrees  There may be up to 2 other swimmers in the pool at the same time  The pool deck is tile, please wear shoes with good traction if you prefer not to be barefoot.    Contact Info:  For appointment scheduling and cancellations:         Please call the [facility]  PH:[phone]              Aquatic Therapy  Outpatient Rehabilitation @ [facility]       All sessions are 45 minutes                                                    

## 2021-10-15 DIAGNOSIS — F4310 Post-traumatic stress disorder, unspecified: Secondary | ICD-10-CM | POA: Diagnosis not present

## 2021-10-15 DIAGNOSIS — F4325 Adjustment disorder with mixed disturbance of emotions and conduct: Secondary | ICD-10-CM | POA: Diagnosis not present

## 2021-10-18 ENCOUNTER — Ambulatory Visit: Payer: Medicaid Other

## 2021-10-18 DIAGNOSIS — M6281 Muscle weakness (generalized): Secondary | ICD-10-CM | POA: Diagnosis not present

## 2021-10-18 DIAGNOSIS — R262 Difficulty in walking, not elsewhere classified: Secondary | ICD-10-CM

## 2021-10-18 DIAGNOSIS — G8929 Other chronic pain: Secondary | ICD-10-CM

## 2021-10-18 NOTE — Therapy (Signed)
?OUTPATIENT PHYSICAL THERAPY TREATMENT NOTE ? ? ?Patient Name: Melinda [name] ?MRN: [id number] ?DOB:07/20/1965, 56 y.o., female ?Today's Date: 10/18/2021 ? ?PCP: [name], MD ?REFERRING PROVIDER: Jessy Oto, MD ? ? PT End of Session - 10/18/21 1400   ? ? Visit Number 12   ? Number of Visits 17   ? Date for PT Re-Evaluation 11/23/21   ? Authorization Type [employer] MCD   ? Authorization - Visit Number 9   ? Authorization - Number of Visits 27   ? PT Start Time 1400   ? PT Stop Time 1440   ? PT Time Calculation (min) 40 min   ? Activity Tolerance Patient limited by pain;Patient tolerated treatment well   ? Behavior During Therapy [facility] for tasks assessed/performed   ? ?  ?  ? ?  ? ? ?Past Medical History:  ?Diagnosis Date  ? ADHD (attention deficit hyperactivity disorder)   ? Arthritis   ? Asthma   ? Cancer [facility])   ? Thyroid  ? Chicken pox AGE 83  ? COPD (chronic obstructive pulmonary disease) ([place])   ? pt reported  ? Diabetes mellitus without complication ([place])   ? Diverticulosis, sigmoid   ? GERD (gastroesophageal reflux disease)   ? Glaucoma   ? BOTH EYES, NO EYE DROPS  ? Glaucoma   ? History of blood transfusion 08/2014  ?  2UNITS GIVEN AND IRON GIVEN  ? Hyperglycemia 09/09/2014  ? Hypertension   ? 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 HAD TO LEARN TO WALK AGAIN  ? Iron deficiency anemia due to chronic blood loss 09/09/2014  ? Low TSH level 09/08/2014  ? Lumbar herniated disc   ? Menorrhagia 09/08/2014  ? Migraine   ? CLUSTER AND MIGRAINES  ? Multiple thyroid nodules   ? Ovarian mass, right 09/09/2014  ? PE (pulmonary embolism) 2016  ? Peripheral neuropathy   ? FINGER TIPS AND TOES NUMB SOME  ? Preeclampsia 1983  ? WITH PREGNANCY  ? PTSD (post-traumatic stress disorder)   ? Scoliosis   ? Seizures ([place]) AGE 85  ? NONE SINCE, HAD CHICKEN POX THEN  ? ?Past Surgical History:  ?Procedure Laterality Date  ? ANGIOGRAM TO LEG  08-13-15  ? RIGHT  ? CHOLECYSTECTOMY    ? IR GENERIC  HISTORICAL  04/04/2016  ? IR RADIOLOGIST EVAL & MGMT 04/04/2016 [name], MD [facility] INTERV RAD  ? RADIOLOGY WITH ANESTHESIA N/A 07/17/2021  ? Procedure: MRI LUMBAR SPINE WITHOUT CONTRAST; MRI CERVICAL SPINE WITHOUT CONTRAST WITH ANESTHESIA;  Surgeon: Radiologist, Medication, MD;  Location: [place];  Service: Radiology;  Laterality: N/A;  ? THYROIDECTOMY N/A 11/17/2015  ? Procedure: TOTAL THYROIDECTOMY;  Surgeon: Armandina Gemma, MD;  [facility];  Service: General;  Laterality: N/A;  ? UTERINE ARTERY EMBOLIZATION Bilateral 08/13/2015  ? ?Patient Active Problem List  ? Diagnosis Date Noted  ? 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 Abdominal pannus 08/21/2021  ? Aortic atherosclerosis ([place]) 08/21/2021  ? At high risk for falls 04/20/2020  ? Hyperlipidemia 02/16/2020  ? Adrenal mass, right ([place]) 04/29/2019  ? Chronic cough 03/12/2018  ? Left shoulder pain 10/14/2016  ? Hypertension 10/14/2016  ? S/P thyroidectomy 06/27/2016  ? Chronic bilateral low back pain without sciatica 04/22/2016  ? Liver lesion 12/24/2015  ? Controlled type 2 diabetes mellitus with complication, with long-term current use of insulin ([place]) 10/10/2015  ? Vision loss 08/25/2015  ? Decreased vision in  both eyes 08/25/2015  ? Prolonged Q-T interval on ECG 08/19/2015  ? History of pulmonary embolism 08/10/2015  ? Mild persistent asthma 04/13/2015  ? Fibroid, uterine   ? Morbid obesity ([place]) 11/11/2014  ? Atypical chest pain 11/08/2014  ? Sinus tachycardia 11/08/2014  ? Migraine 10/14/2014  ? Menorrhagia 09/08/2014  ? ? ?REFERRING DIAG:  ?M48.02 (ICD-10-CM) - Spinal stenosis of cervical region ?M47.26 (ICD-10-CM) - Other spondylosis with radiculopathy, lumbar region ?M50.30 (ICD-10-CM) - Degenerative disc disease, cervical ?M47.812 (ICD-10-CM) - Spondylosis of cervical spine ?M77.8 (ICD-10-CM) - Tendinitis of shoulder, unspecified laterality ? ?THERAPY DIAG:  ?Muscle weakness (generalized) ? ?Difficulty in walking, not elsewhere  classified ? ?Chronic right shoulder pain ? ?PERTINENT HISTORY:  ?HTN, peripheral neuropathy, DM II, PE, thyroid cancer, COPD ? ?PRECAUTIONS: Fall, no OH lifting per Dr. [name] ? ?SUBJECTIVE: Will see Drs. Ernestina Patches and [place] for f/u next month. ? ?Pain: ?Are you having pain? Yes ?NPRS: 4/10 LBP, 5/10 neck and RUE  ?Pain location: bilateral knees ?PAIN TYPE: sharp ?Pain description: intermittent  ?Aggravating factors: prolonged standing, walking ?Relieving factors: rest ? ? ?OBJECTIVE:  ?*Unless otherwise noted, objective information collected previously* ?  ?PATIENT SURVEYS:  ?Modified Oswestry 86% disability; 09/28/2021: 66% disability ?Quick Dash 87% disability; 09/28/2021: 62% disability ?  ? CERVICAL AROM/PROM ?  ?A/PROM A/PROM (deg) ?08/06/2021 AROM ?09/28/2021  ?Right rotation 43 p! 42 minor pain  ?Left rotation 35 48 minor pain  ? (Blank rows = not tested) ?  ?UE AROM/PROM: ?  ?A/PROM Right ?08/06/2021 Left ?08/06/2021 Right ?09/28/2021  ?Shoulder flexion 82 WFL 150 p! At end range  ?Shoulder extension       ?Shoulder abduction 75 WFL 128 p! At end range  ?Shoulder adduction       ?Shoulder extension       ?Shoulder internal rotation 15 WFL 70  ?Shoulder external rotation 20 WFL 15  ?Elbow flexion       ?Elbow extension       ?Wrist flexion       ?Wrist extension       ?Wrist ulnar deviation       ?Wrist radial deviation       ?Wrist pronation       ?Wrist supination       ? (Blank rows = not tested) ?  ?UE MMT: ?  ?MMT Right ?08/06/2021 Left ?08/06/2021 Right ?09/28/2021 Left ?09/28/2021  ?Shoulder flexion 2+/5 3+/5 4/5 4/5  ?Shoulder abduction 2+/5 3+/5 4/5 4/5  ?Shoulder internal rotation 3/5 3+/5 5/5 5/5  ?Shoulder external rotation 35 3+/5 4/5 4/5  ?Grip strength 55# 54#    ? (Blank rows = not tested) ?  ?LE MMT: ?  ?MMT Right ?08/06/2021 Left ?08/06/2021 Right ?09/14/2021 Left ?09/14/2021  ?Hip flexion 3+/5 2+/5 4/5 3/5  ?Hip abduction 3+/5 2+/5    ?Hip adduction 3+/5 3/5    ?Knee flexion 4/5 3/5 4+/5 3+/5  ?Knee extension 4/5  3/5 4/5 3+/5  ? (Blank rows = not tested) ?  ?Orthostatics: ?        Sitting: 114/88 ?           Standing: 114/84  ?  ?FUNCTIONAL TESTS:  ?30 Second Sit to Stand: 2 reps ?09/12/2021: 10 reps with UE support ?  ? ?TODAY'S TREATMENT:  ?[facility] Adult PT Treatment:  DATE: 10/18/21 ?Therapeutic Exercise: ?LAQ 2x20 5# ?Seated march 2x20 5# ?Seated hamstring curl 2x20 GTB ?Seated heel/toe on rocker board 2x15 ?Seated hip adduction ball squeeze 5" hold x10  ?Seated shoulder OH press YTB 15x(hold on future appointments due to pain and lifting restrictions) ?Seated Row 2x15 YTB ?Seated horizontal abduction YTB 2 x 15(R shoulder)stabilizing only ?Seated bilateral ER YTB 2x15 ?Seated elbow flexion B YTB 2x15 ?Seated B hip abduction GTB 15x2 ? ?[facility] Adult PT Treatment:                                                DATE: 10/11/21 ?Therapeutic Exercise: ?LAQ 2x15 5# ?Seated march 2x15 5# ?Seated hamstring curl 2x15 GTB ?Seated heel/toe on rocker board 2x15 ?Seated hip adduction ball squeeze 5" hold x10 (pt doesn't enjoy due to body habitus) ?Seated shoulder OH press holding tennis ball 15x ?Seated Row 2x15 GTB ?Seated horizontal abduction GTB 2 x 15 (small range to prevent pain in R shoulder) ?Seated bilateral ER GTB 2x15 ?Seated elbow flexion B GTB 2x15 ?Seated B hip abduction GTB 15x2 ? ?[facility] Adult PT Treatment:                                                DATE: 10/04/21 ?Therapeutic Exercise: ?LAQ 3x15 4# ?Seated march 3x20 4# ?Seated hamstring curl 2x15 GTB ?Seated heel/toe on rocker board 2x15 ?Seated hip adduction ball squeeze 5" hold x10 (pt doesn't enjoy due to body habitus) ?Seated shoulder OH press holding tennis ball15x ?Seated Row 2x15 BlueTB ?Seated horizontal abduction GTB 2 x 15 (small range to prevent pain in R shoulder) ?Seated bilateral ER GTB 2x15 ?Seated elbow flexion B GTB 2x15 ?Seated B hip abduction GTB 15x ? ? ? ? ? ?  ?PATIENT EDUCATION:  ?Education details:  continue HEP ?Person educated: Patient ?Education method: Explanation, Demonstration, and Handouts ?Education comprehension: verbalized understanding and returned demonstration ?  ?HOME EXERCISE PROGRAM: ?Access Code: [email or web address]

## 2021-10-25 ENCOUNTER — Ambulatory Visit: Payer: Medicaid Other

## 2021-11-02 ENCOUNTER — Ambulatory Visit: Payer: Medicaid Other

## 2021-11-05 ENCOUNTER — Ambulatory Visit: Payer: Self-pay

## 2021-11-05 ENCOUNTER — Ambulatory Visit: Payer: Medicaid Other | Admitting: Specialist

## 2021-11-05 ENCOUNTER — Ambulatory Visit (INDEPENDENT_AMBULATORY_CARE_PROVIDER_SITE_OTHER): Payer: Medicaid Other | Admitting: Physical Medicine and Rehabilitation

## 2021-11-05 ENCOUNTER — Encounter: Payer: Self-pay | Admitting: Physical Medicine and Rehabilitation

## 2021-11-05 VITALS — BP 136/83 | HR 92

## 2021-11-05 DIAGNOSIS — F4325 Adjustment disorder with mixed disturbance of emotions and conduct: Secondary | ICD-10-CM | POA: Diagnosis not present

## 2021-11-05 DIAGNOSIS — M5416 Radiculopathy, lumbar region: Secondary | ICD-10-CM

## 2021-11-05 DIAGNOSIS — F4310 Post-traumatic stress disorder, unspecified: Secondary | ICD-10-CM | POA: Diagnosis not present

## 2021-11-05 MED ORDER — METHYLPREDNISOLONE ACETATE 80 MG/ML IJ SUSP
80.0000 mg | Freq: Once | INTRAMUSCULAR | Status: AC
  Administered 2021-11-05: 80 mg

## 2021-11-05 NOTE — Patient Instructions (Signed)

## 2021-11-05 NOTE — Progress Notes (Signed)
Numeric Pain Rating Scale and Functional Assessment Average Pain 6   In the last MONTH (on 0-10 scale) has pain interfered with the following?  1. General activity like being  able to carry out your everyday physical activities such as walking, climbing stairs, carrying groceries, or moving a chair?  Rating(8)  Patient comes in for a back injection. Stopped Eliquis on 10/24/2021. Uses a rolling walker. Takes Gabapentin, Tylenol and Flexeril for pain.   +Driver, -BT, -Dye Allergies.

## 2021-11-06 ENCOUNTER — Other Ambulatory Visit: Payer: Self-pay | Admitting: Obstetrics and Gynecology

## 2021-11-06 NOTE — Patient Instructions (Signed)
Hey Ms. Catterton, sorry I could not reach you today-I hope you are okay - as a part of your Medicaid benefit, you are eligible for care management and care coordination services at no cost or copay. I was unable to reach you by phone today but would be happy to help you with your health related needs. Please feel free to call me at [phone] ? ?A member of the Managed Medicaid care management team will reach out to you again over the next 30 business  days.  ? ?Aida Raider RN, BSN ?[facility] ?Care Management Coordinator - Managed Medicaid High Risk ?[phone]  ?

## 2021-11-06 NOTE — Patient Outreach (Signed)
Care Coordination ? ?11/06/2021 ? ?Tnya Ades ?1965-12-23 ?[id number] ? ? ?Medicaid Managed Care  ? ?Unsuccessful Outreach Note ? ?11/06/2021 ?Name: Melinda [name] MRN: [id number] DOB: [birth year] ? ?Referred by: [name], MD ?Reason for referral : High Risk Managed Medicaid (Unsuccessful telephone outreach) ? ? ?An unsuccessful telephone outreach was attempted today. The patient was referred to the case management team for assistance with care management and care coordination.  ? ?Follow Up Plan: The care management team will reach out to the patient again over the next 30 business days.  ? ?Aida Raider RN, BSN ?[facility] ?Care Management Coordinator - Managed Medicaid High Risk ?[phone]  ? ? ?

## 2021-11-07 ENCOUNTER — Ambulatory Visit (INDEPENDENT_AMBULATORY_CARE_PROVIDER_SITE_OTHER): Payer: Medicaid Other | Admitting: Specialist

## 2021-11-07 ENCOUNTER — Ambulatory Visit (INDEPENDENT_AMBULATORY_CARE_PROVIDER_SITE_OTHER): Payer: Medicaid Other

## 2021-11-07 ENCOUNTER — Encounter: Payer: Self-pay | Admitting: Specialist

## 2021-11-07 VITALS — BP 125/81 | HR 92 | Ht 70.0 in | Wt 351.0 lb

## 2021-11-07 DIAGNOSIS — M4802 Spinal stenosis, cervical region: Secondary | ICD-10-CM

## 2021-11-07 DIAGNOSIS — M5416 Radiculopathy, lumbar region: Secondary | ICD-10-CM | POA: Diagnosis not present

## 2021-11-07 DIAGNOSIS — M79675 Pain in left toe(s): Secondary | ICD-10-CM | POA: Diagnosis not present

## 2021-11-07 DIAGNOSIS — M4726 Other spondylosis with radiculopathy, lumbar region: Secondary | ICD-10-CM | POA: Diagnosis not present

## 2021-11-07 DIAGNOSIS — S90111A Contusion of right great toe without damage to nail, initial encounter: Secondary | ICD-10-CM | POA: Diagnosis not present

## 2021-11-07 NOTE — Progress Notes (Signed)
? ?Office Visit Note ?  ?Patient: Melinda [name]           ?Date of Birth: March 12, 1966           ?MRN: [id number] ?Visit Date: 11/07/2021 ?             ?Requested by: [name], MD ?[address] ?[address] ?[place],  [place] [address] ?PCP: [name], MD ? ? ?Assessment & Plan: ?Visit Diagnoses:  ?1. Pain of left great toe Acute  ?2. Lumbar radiculopathy   ?3. Other spondylosis with radiculopathy, lumbar region   ?4. Spinal stenosis of cervical region   ? ? ?Plan: Avoid bending, stooping and avoid lifting weights greater than 10 lbs. ?Avoid prolong standing and walking. ?Avoid frequent bending and stooping  ?No lifting greater than 10 lbs. ?May use ice or moist heat for pain. ?Weight loss is of benefit. ?Handicap license is approved. ?Return in 2 weeks to reassess. ? ?Follow-Up Instructions: Return in about 2 weeks (around 11/21/2021) for [place] in 2 weeks.  ? ?Orders:  ?Orders Placed This Encounter  ?Procedures  ? XR Toe Great Left  ? ?No orders of the defined types were placed in this encounter. ? ? ? ? Procedures: ?No procedures performed ? ? ?Clinical Data: ?No additional findings. ? ? ?Subjective: ?Chief Complaint  ?Patient presents with  ? Lower Back - Follow-up  ? ? ?56 year old female with history of back pain with pain radiating into the left side and buttock and left knee. She has had a recent ESI central L5-S1 IL done just 2 days ago. No bowel or bladder difficulty. Does have urgency. She reports having injured her left great toe this past weekend when she caught her left great toe under a door and fell backwards sustaining a laceration of the medial left great toe. Some chills. She has diabetes and uses a walker. She has difficulty with her back slumping over time when sitting up right in bed. She is a side sleeper.  ? ?Review of Systems  ?Constitutional: Negative.   ?HENT:  Positive for congestion, rhinorrhea, sinus pain and sneezing.   ?Eyes: Negative.   ?Respiratory:  Positive for cough,  choking and wheezing.   ?Cardiovascular: Negative.   ?Gastrointestinal: Negative.   ?Endocrine: Negative.   ?Genitourinary: Negative.   ?Musculoskeletal: Negative.   ?Skin: Negative.   ?Allergic/Immunologic: Negative.   ?Neurological: Negative.   ?Hematological: Negative.   ?Psychiatric/Behavioral: Negative.    ? ? ?Objective: ?Vital Signs: BP 125/81 (BP Location: Left Arm, Patient Position: Sitting)   Pulse 92   Ht '5\' 10"'$  (1.778 m)   Wt (!) 351 lb (159.2 kg)   BMI 50.36 kg/m?  ? ?Physical Exam ?Constitutional:   ?   Appearance: She is well-developed.  ?HENT:  ?   Head: Normocephalic and atraumatic.  ?Eyes:  ?   Pupils: Pupils are equal, round, and reactive to light.  ?Pulmonary:  ?   Effort: Pulmonary effort is normal.  ?   Breath sounds: Normal breath sounds.  ?Abdominal:  ?   General: Bowel sounds are normal.  ?   Palpations: Abdomen is soft.  ?Musculoskeletal:  ?   Cervical back: Normal range of motion and neck supple.  ?Skin: ?   General: Skin is warm and dry.  ?Neurological:  ?   Mental Status: She is alert and oriented to person, place, and time.  ?Psychiatric:     ?   Behavior: Behavior normal.     ?  Thought Content: Thought content normal.     ?   Judgment: Judgment normal.  ? ?Back Exam  ? ?Tenderness  ?The patient is experiencing tenderness in the lumbar. ? ?Range of Motion  ?Extension:  abnormal  ?Flexion:  abnormal  ?Lateral bend right:  normal  ?Lateral bend left:  normal  ?Rotation right:  normal  ?Rotation left:  normal  ? ?Muscle Strength  ?Right Quadriceps:  5/5  ?Left Quadriceps:  5/5  ?Left Hamstrings:  5/5  ? ?Reflexes  ?Patellar:  0/4 ?Achilles:  0/4 ? ?Comments:  Left great toe with abrasion over the medial proximal phalanx. Pain with ROM, mild swelling. There is drainage on her sock medially.  ? ? ? ?Specialty Comments:  ?No specialty comments available. ? ?Imaging: ?No results found. ? ? ?PMFS History: ?Patient Active Problem List  ? Diagnosis Date Noted  ? Myelolipoma of right adrenal  gland 08/21/2021  ? Abdominal pannus 08/21/2021  ? Aortic atherosclerosis ([place]) 08/21/2021  ? At high risk for falls 04/20/2020  ? Hyperlipidemia 02/16/2020  ? Adrenal mass, right ([place]) 04/29/2019  ? Chronic cough 03/12/2018  ? Left shoulder pain 10/14/2016  ? Hypertension 10/14/2016  ? S/P thyroidectomy 06/27/2016  ? Chronic bilateral low back pain without sciatica 04/22/2016  ? Liver lesion 12/24/2015  ? Controlled type 2 diabetes mellitus with complication, with long-term current use of insulin ([place]) 10/10/2015  ? Vision loss 08/25/2015  ? Decreased vision in both eyes 08/25/2015  ? Prolonged Q-T interval on ECG 08/19/2015  ? History of pulmonary embolism 08/10/2015  ? Mild persistent asthma 04/13/2015  ? Fibroid, uterine   ? Morbid obesity ([place]) 11/11/2014  ? Atypical chest pain 11/08/2014  ? Sinus tachycardia 11/08/2014  ? Migraine 10/14/2014  ? Menorrhagia 09/08/2014  ? ?Past Medical History:  ?Diagnosis Date  ? ADHD (attention deficit hyperactivity disorder)   ? Arthritis   ? Asthma   ? Cancer [facility])   ? Thyroid  ? Chicken pox AGE 77  ? COPD (chronic obstructive pulmonary disease) ([place])   ? pt reported  ? Diabetes mellitus without complication ([place])   ? Diverticulosis, sigmoid   ? GERD (gastroesophageal reflux disease)   ? Glaucoma   ? BOTH EYES, NO EYE DROPS  ? Glaucoma   ? History of blood transfusion 08/2014  ?  2UNITS GIVEN AND IRON GIVEN  ? Hyperglycemia 09/09/2014  ? Hypertension   ? 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 HAD TO LEARN TO WALK AGAIN  ? Iron deficiency anemia due to chronic blood loss 09/09/2014  ? Low TSH level 09/08/2014  ? Lumbar herniated disc   ? Menorrhagia 09/08/2014  ? Migraine   ? CLUSTER AND MIGRAINES  ? Multiple thyroid nodules   ? Ovarian mass, right 09/09/2014  ? PE (pulmonary embolism) 2016  ? Peripheral neuropathy   ? FINGER TIPS AND TOES NUMB SOME  ? Preeclampsia 1983  ? WITH PREGNANCY  ? PTSD (post-traumatic stress disorder)   ?  Scoliosis   ? Seizures ([place]) AGE 76  ? NONE SINCE, HAD CHICKEN POX THEN  ?  ?Family History  ?Problem Relation Age of Onset  ? Diabetes Mother   ? Breast cancer Mother   ? CAD Mother   ? Hypertension Mother   ? Alcohol abuse Father   ? Hypertension Father   ? Breast cancer Maternal Aunt   ? Breast cancer Maternal Aunt   ?  ?Past Surgical History:  ?  Procedure Laterality Date  ? ANGIOGRAM TO LEG  08-13-15  ? RIGHT  ? CHOLECYSTECTOMY    ? IR GENERIC HISTORICAL  04/04/2016  ? IR RADIOLOGIST EVAL & MGMT 04/04/2016 [name], MD [facility] INTERV RAD  ? RADIOLOGY WITH ANESTHESIA N/A 07/17/2021  ? Procedure: MRI LUMBAR SPINE WITHOUT CONTRAST; MRI CERVICAL SPINE WITHOUT CONTRAST WITH ANESTHESIA;  Surgeon: Radiologist, Medication, MD;  Location: [facility];  Service: Radiology;  Laterality: N/A;  ? THYROIDECTOMY N/A 11/17/2015  ? Procedure: TOTAL THYROIDECTOMY;  Surgeon: Armandina Gemma, MD;  [facility];  Service: General;  Laterality: N/A;  ? UTERINE ARTERY EMBOLIZATION Bilateral 08/13/2015  ? ?Social History  ? ?Occupational History  ? Occupation: disabled  ?Tobacco Use  ? Smoking status: Former  ?  Packs/day: 0.50  ?  Years: 40.00  ?  Pack years: 20.00  ?  Types: Cigarettes  ?  Quit date: 11/03/2014  ?  Years since quitting: 7.0  ? Smokeless tobacco: Never  ?Vaping Use  ? Vaping Use: Never used  ?Substance and Sexual Activity  ? Alcohol use: No  ?  Alcohol/week: 0.0 standard drinks  ? Drug use: No  ? Sexual activity: Never  ?  Birth control/protection: None  ? ? ? ? ? ? ?

## 2021-11-07 NOTE — Patient Instructions (Addendum)
Avoid bending, stooping and avoid lifting weights greater than 10 lbs. ?Avoid prolong standing and walking. ?Avoid frequent bending and stooping  ?No lifting greater than 10 lbs. ?May use ice or moist heat for pain. ?Weight loss is of benefit. ?Handicap license is approved. ?Return in 2 weeks to reassess. ?She was told to paint the abrasion of the medial left great toe base with betadiene one time per ?Day and use a bandaid.  ?

## 2021-11-08 NOTE — Therapy (Signed)
?OUTPATIENT PHYSICAL THERAPY TREATMENT NOTE ? ? ?Patient Name: Melinda [name] ?MRN: [id number] ?DOB:1965-09-02, 56 y.o., female ?Today's Date: 11/09/2021 ? ?PCP: [name], MD ?REFERRING PROVIDER: [name], MD ? ? PT End of Session - 11/09/21 1457   ? ? Visit Number 13   ? Number of Visits 17   ? Date for PT Re-Evaluation 11/23/21   ? Authorization Type [employer] MCD   ? Authorization - Visit Number 10   ? Authorization - Number of Visits 27   ? PT Start Time 1515   ? PT Stop Time 1600   ? PT Time Calculation (min) 45 min   ? Activity Tolerance Patient tolerated treatment well   ? Behavior During Therapy [facility] for tasks assessed/performed   ? ?  ?  ? ?  ? ? ? ?Past Medical History:  ?Diagnosis Date  ? ADHD (attention deficit hyperactivity disorder)   ? Arthritis   ? Asthma   ? Cancer [facility])   ? Thyroid  ? Chicken pox AGE 76  ? COPD (chronic obstructive pulmonary disease) ([place])   ? pt reported  ? Diabetes mellitus without complication ([place])   ? Diverticulosis, sigmoid   ? GERD (gastroesophageal reflux disease)   ? Glaucoma   ? BOTH EYES, NO EYE DROPS  ? Glaucoma   ? History of blood transfusion 08/2014  ?  2UNITS GIVEN AND IRON GIVEN  ? Hyperglycemia 09/09/2014  ? Hypertension   ? 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 HAD TO LEARN TO WALK AGAIN  ? Iron deficiency anemia due to chronic blood loss 09/09/2014  ? Low TSH level 09/08/2014  ? Lumbar herniated disc   ? Menorrhagia 09/08/2014  ? Migraine   ? CLUSTER AND MIGRAINES  ? Multiple thyroid nodules   ? Ovarian mass, right 09/09/2014  ? PE (pulmonary embolism) 2016  ? Peripheral neuropathy   ? FINGER TIPS AND TOES NUMB SOME  ? Preeclampsia 1983  ? WITH PREGNANCY  ? PTSD (post-traumatic stress disorder)   ? Scoliosis   ? Seizures ([place]) AGE 89  ? NONE SINCE, HAD CHICKEN POX THEN  ? ?Past Surgical History:  ?Procedure Laterality Date  ? ANGIOGRAM TO LEG  08-13-15  ? RIGHT  ? CHOLECYSTECTOMY    ? IR GENERIC HISTORICAL   04/04/2016  ? IR RADIOLOGIST EVAL & MGMT 04/04/2016 [name], MD [facility] INTERV RAD  ? RADIOLOGY WITH ANESTHESIA N/A 07/17/2021  ? Procedure: MRI LUMBAR SPINE WITHOUT CONTRAST; MRI CERVICAL SPINE WITHOUT CONTRAST WITH ANESTHESIA;  Surgeon: Radiologist, Medication, MD;  Location: [place];  Service: Radiology;  Laterality: N/A;  ? THYROIDECTOMY N/A 11/17/2015  ? Procedure: TOTAL THYROIDECTOMY;  Surgeon: Armandina Gemma, MD;  Location: [facility];  Service: General;  Laterality: N/A;  ? UTERINE ARTERY EMBOLIZATION Bilateral 08/13/2015  ? ?Patient Active Problem List  ? Diagnosis Date Noted  ? 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 Abdominal pannus 08/21/2021  ? Aortic atherosclerosis ([place]) 08/21/2021  ? At high risk for falls 04/20/2020  ? Hyperlipidemia 02/16/2020  ? Adrenal mass, right ([place]) 04/29/2019  ? Chronic cough 03/12/2018  ? Left shoulder pain 10/14/2016  ? Hypertension 10/14/2016  ? S/P thyroidectomy 06/27/2016  ? Chronic bilateral low back pain without sciatica 04/22/2016  ? Liver lesion 12/24/2015  ? Controlled type 2 diabetes mellitus with complication, with long-term current use of insulin ([place]) 10/10/2015  ? Vision loss 08/25/2015  ? Decreased vision in both eyes  08/25/2015  ? Prolonged Q-T interval on ECG 08/19/2015  ? History of pulmonary embolism 08/10/2015  ? Mild persistent asthma 04/13/2015  ? Fibroid, uterine   ? Morbid obesity ([place]) 11/11/2014  ? Atypical chest pain 11/08/2014  ? Sinus tachycardia 11/08/2014  ? Migraine 10/14/2014  ? Menorrhagia 09/08/2014  ? ? ?REFERRING DIAG:  ?M48.02 (ICD-10-CM) - Spinal stenosis of cervical region ?M47.26 (ICD-10-CM) - Other spondylosis with radiculopathy, lumbar region ?M50.30 (ICD-10-CM) - Degenerative disc disease, cervical ?M47.812 (ICD-10-CM) - Spondylosis of cervical spine ?M77.8 (ICD-10-CM) - Tendinitis of shoulder, unspecified laterality ? ?THERAPY DIAG:  ?Muscle weakness (generalized) ? ?Difficulty in walking, not elsewhere classified ? ?Chronic right  shoulder pain ? ?Other abnormalities of gait and mobility ? ?Unsteadiness on feet ? ?PERTINENT HISTORY:  ?HTN, peripheral neuropathy, DM II, PE, thyroid cancer, COPD ? ?PRECAUTIONS: Fall, no OH lifting per Dr. [name] ? ?SUBJECTIVE: Patient reports she got an steroid injection in her lower back on Monday. ? ?Pain: ?Are you having pain? Yes ?NPRS: 5/10 LBP, 6/10 neck and RUE  ?Pain location: bilateral knees ?PAIN TYPE: sharp ?Pain description: intermittent  ?Aggravating factors: prolonged standing, walking ?Relieving factors: rest ? ? ?OBJECTIVE:  ?*Unless otherwise noted, objective information collected previously* ?  ?PATIENT SURVEYS:  ?Modified Oswestry 86% disability; 09/28/2021: 66% disability ?Quick Dash 87% disability; 09/28/2021: 62% disability ?  ? CERVICAL AROM/PROM ?  ?A/PROM A/PROM (deg) ?08/06/2021 AROM ?09/28/2021  ?Right rotation 43 p! 42 minor pain  ?Left rotation 35 48 minor pain  ? (Blank rows = not tested) ?  ?UE AROM/PROM: ?  ?A/PROM Right ?08/06/2021 Left ?08/06/2021 Right ?09/28/2021  ?Shoulder flexion 82 WFL 150 p! At end range  ?Shoulder extension       ?Shoulder abduction 75 WFL 128 p! At end range  ?Shoulder adduction       ?Shoulder extension       ?Shoulder internal rotation 15 WFL 70  ?Shoulder external rotation 20 WFL 15  ?Elbow flexion       ?Elbow extension       ?Wrist flexion       ?Wrist extension       ?Wrist ulnar deviation       ?Wrist radial deviation       ?Wrist pronation       ?Wrist supination       ? (Blank rows = not tested) ?  ?UE MMT: ?  ?MMT Right ?08/06/2021 Left ?08/06/2021 Right ?09/28/2021 Left ?09/28/2021  ?Shoulder flexion 2+/5 3+/5 4/5 4/5  ?Shoulder abduction 2+/5 3+/5 4/5 4/5  ?Shoulder internal rotation 3/5 3+/5 5/5 5/5  ?Shoulder external rotation 35 3+/5 4/5 4/5  ?Grip strength 55# 54#    ? (Blank rows = not tested) ?  ?LE MMT: ?  ?MMT Right ?08/06/2021 Left ?08/06/2021 Right ?09/14/2021 Left ?09/14/2021  ?Hip flexion 3+/5 2+/5 4/5 3/5  ?Hip abduction 3+/5 2+/5    ?Hip adduction  3+/5 3/5    ?Knee flexion 4/5 3/5 4+/5 3+/5  ?Knee extension 4/5 3/5 4/5 3+/5  ? (Blank rows = not tested) ?  ?Orthostatics: ?        Sitting: 114/88 ?           Standing: 114/84  ?  ?FUNCTIONAL TESTS:  ?30 Second Sit to Stand: 2 reps ?09/12/2021: 10 reps with UE support ?  ? ?TODAY'S TREATMENT:  ?[facility] Adult PT Treatment:  DATE: 11/09/2021 ?Aquatic therapy at [address] - therapeutic pool temp 90 degrees ?Pt enters building 90  Treatment took place in water 3.8 to  4 ft 8 in.feet deep depending upon activity.  Pt entered and exited the pool via stair and handrails independently  ?Pt pain level 5-6 at initiation of water walking. ? ?Therapeutic Exercise: ?Walking forward/backwards/side stepping ?Runners stretch on bottom step x30" BIL ?Hamstring stretch on bottom step x30" BIL ?STS from 3rd step from bottom x10 with BIL UE support ?Standing thoracic rotation with hands in neutral for resistance ?At edge of pool, pt performed LE exercise: ?Hip abd/add x10 BIL ?Hip ext/flex with knee straight x 20 BIL ?Hip Circles CC/CCW x10 each BIL ?Marching hip flexion to knee extension 2x10 BIL ?Hamstring curl x20 BIL ?Squats 2x20 ?Sitting on bench in water: ?Bicycle kicks x1' ?Reverse bicycle kicks x1' ?Flutter kicks x1' ?Scissor kicks x1' ?Kickboard push/pull x1' ?Kickboard push downs x1' ?Kickboard thoracic roation x1' ? ?Pt requires the buoyancy of water for active assisted exercises with buoyancy supported for strengthening and AROM exercises. Hydrostatic pressure also supports joints by unweighting joint load by at least 50 % in 3-4 feet depth water. 80% in chest to neck deep water. Water will provide assistance with movement using the current and laminar flow while the buoyancy reduces weight bearing. Pt requires the viscosity of the water for resistance with strengthening exercises. ? ? ? ?[facility] Adult PT Treatment:                                                 DATE: 10/18/21 ?Therapeutic Exercise: ?LAQ 2x20 5# ?Seated march 2x20 5# ?Seated hamstring curl 2x20 GTB ?Seated heel/toe on rocker board 2x15 ?Seated hip adduction ball squeeze 5" hold x10  ?Seated shoulder

## 2021-11-09 ENCOUNTER — Ambulatory Visit: Payer: Medicaid Other | Attending: Specialist

## 2021-11-09 DIAGNOSIS — M25511 Pain in right shoulder: Secondary | ICD-10-CM | POA: Diagnosis present

## 2021-11-09 DIAGNOSIS — M6281 Muscle weakness (generalized): Secondary | ICD-10-CM | POA: Diagnosis present

## 2021-11-09 DIAGNOSIS — R2689 Other abnormalities of gait and mobility: Secondary | ICD-10-CM | POA: Diagnosis present

## 2021-11-09 DIAGNOSIS — R2681 Unsteadiness on feet: Secondary | ICD-10-CM | POA: Diagnosis present

## 2021-11-09 DIAGNOSIS — G8929 Other chronic pain: Secondary | ICD-10-CM | POA: Diagnosis present

## 2021-11-09 DIAGNOSIS — R262 Difficulty in walking, not elsewhere classified: Secondary | ICD-10-CM | POA: Insufficient documentation

## 2021-11-15 NOTE — Therapy (Incomplete)
OUTPATIENT PHYSICAL THERAPY TREATMENT NOTE   Patient Name: Melinda [name] MRN: [id number] DOB:02/23/1966, 56 y.o., female Today's Date: 11/15/2021  PCP: [name], MD REFERRING PROVIDER: [name], MD      Past Medical History:  Diagnosis Date   ADHD (attention deficit hyperactivity disorder)    Arthritis    Asthma    Cancer ([place])    Thyroid   Chicken pox AGE 89   COPD (chronic obstructive pulmonary disease) ([place])    pt reported   Diabetes mellitus without complication ([place])    Diverticulosis, sigmoid    GERD (gastroesophageal reflux disease)    Glaucoma    BOTH EYES, NO EYE DROPS   Glaucoma    History of blood transfusion 08/2014    2UNITS GIVEN AND IRON GIVEN   Hyperglycemia 09/09/2014   Hypertension    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 ([place]) AGE 61   HAD TO LEARN TO WALK AGAIN   Iron deficiency anemia due to chronic blood loss 09/09/2014   Low TSH level 09/08/2014   Lumbar herniated disc    Menorrhagia 09/08/2014   Migraine    CLUSTER AND MIGRAINES   Multiple thyroid nodules    Ovarian mass, right 09/09/2014   PE (pulmonary embolism) 2016   Peripheral neuropathy    FINGER TIPS AND TOES NUMB SOME   Preeclampsia 1983   WITH PREGNANCY   PTSD (post-traumatic stress disorder)    Scoliosis    Seizures ([place]) AGE 89   NONE SINCE, HAD CHICKEN POX THEN   Past Surgical History:  Procedure Laterality Date   ANGIOGRAM TO LEG  08-13-15   RIGHT   CHOLECYSTECTOMY     IR GENERIC HISTORICAL  04/04/2016   IR RADIOLOGIST EVAL & MGMT 04/04/2016 [name], MD [facility] INTERV RAD   RADIOLOGY WITH ANESTHESIA N/A 07/17/2021   Procedure: MRI LUMBAR SPINE WITHOUT CONTRAST; MRI CERVICAL SPINE WITHOUT CONTRAST WITH ANESTHESIA;  Surgeon: Radiologist, Medication, MD;  Location: [place];  Service: Radiology;  Laterality: N/A;   THYROIDECTOMY N/A 11/17/2015   Procedure: TOTAL THYROIDECTOMY;  Surgeon: Armandina Gemma, MD;  [facility];   Service: General;  Laterality: N/A;   UTERINE ARTERY EMBOLIZATION Bilateral 08/13/2015   Patient Active Problem List   Diagnosis Date Noted   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   Abdominal pannus 08/21/2021   Aortic atherosclerosis ([place]) 08/21/2021   At high risk for falls 04/20/2020   Hyperlipidemia 02/16/2020   Adrenal mass, right ([place]) 04/29/2019   Chronic cough 03/12/2018   Left shoulder pain 10/14/2016   Hypertension 10/14/2016   S/P thyroidectomy 06/27/2016   Chronic bilateral low back pain without sciatica 04/22/2016   Liver lesion 12/24/2015   Controlled type 2 diabetes mellitus with complication, with long-term current use of insulin ([place]) 10/10/2015   Vision loss 08/25/2015   Decreased vision in both eyes 08/25/2015   Prolonged Q-T interval on ECG 08/19/2015   History of pulmonary embolism 08/10/2015   Mild persistent asthma 04/13/2015   Fibroid, uterine    Morbid obesity ([place]) 11/11/2014   Atypical chest pain 11/08/2014   Sinus tachycardia 11/08/2014   Migraine 10/14/2014   Menorrhagia 09/08/2014    REFERRING DIAG:  M48.02 (ICD-10-CM) - Spinal stenosis of cervical region M47.26 (ICD-10-CM) - Other spondylosis with radiculopathy, lumbar region M50.30 (ICD-10-CM) - Degenerative disc disease, cervical M47.812 (ICD-10-CM) - Spondylosis of cervical spine M77.8 (ICD-10-CM) - Tendinitis of shoulder, unspecified laterality  THERAPY DIAG:  No diagnosis found.  PERTINENT HISTORY:  HTN, peripheral neuropathy, DM II, PE, thyroid cancer, COPD  PRECAUTIONS: Fall, no OH lifting per Dr. [name]  SUBJECTIVE: *** Patient reports she got an steroid injection in her lower back on Monday.  Pain: Are you having pain? Yes NPRS: ***/10 LBP, 6/10 neck and RUE  Pain location: bilateral knees PAIN TYPE: sharp Pain description: intermittent  Aggravating factors: prolonged standing, walking Relieving factors: rest   OBJECTIVE:  *Unless otherwise noted, objective  information collected previously*   PATIENT SURVEYS:  Modified Oswestry 86% disability; 09/28/2021: 66% disability Quick Dash 87% disability; 09/28/2021: 62% disability    CERVICAL AROM/PROM   A/PROM A/PROM (deg) 08/06/2021 AROM 09/28/2021  Right rotation 43 p! 42 minor pain  Left rotation 35 48 minor pain   (Blank rows = not tested)   UE AROM/PROM:   A/PROM Right 08/06/2021 Left 08/06/2021 Right 09/28/2021  Shoulder flexion 82 WFL 150 p! At end range  Shoulder extension       Shoulder abduction 75 WFL 128 p! At end range  Shoulder adduction       Shoulder extension       Shoulder internal rotation 15 WFL 70  Shoulder external rotation 20 WFL 15  Elbow flexion       Elbow extension       Wrist flexion       Wrist extension       Wrist ulnar deviation       Wrist radial deviation       Wrist pronation       Wrist supination        (Blank rows = not tested)   UE MMT:   MMT Right 08/06/2021 Left 08/06/2021 Right 09/28/2021 Left 09/28/2021  Shoulder flexion 2+/5 3+/5 4/5 4/5  Shoulder abduction 2+/5 3+/5 4/5 4/5  Shoulder internal rotation 3/5 3+/5 5/5 5/5  Shoulder external rotation 35 3+/5 4/5 4/5  Grip strength 55# 54#     (Blank rows = not tested)   LE MMT:   MMT Right 08/06/2021 Left 08/06/2021 Right 09/14/2021 Left 09/14/2021  Hip flexion 3+/5 2+/5 4/5 3/5  Hip abduction 3+/5 2+/5    Hip adduction 3+/5 3/5    Knee flexion 4/5 3/5 4+/5 3+/5  Knee extension 4/5 3/5 4/5 3+/5   (Blank rows = not tested)   Orthostatics:         Sitting: 114/88            Standing: 114/84    FUNCTIONAL TESTS:  30 Second Sit to Stand: 2 reps 09/12/2021: 10 reps with UE support    TODAY'S TREATMENT:  [facility] Adult PT Treatment:                                                DATE: 11/16/2021 Aquatic therapy at [address] - therapeutic pool temp *** degrees Pt enters building ***  Treatment took place in water 3.8 to  4 ft 8 in.feet deep depending upon activity.  Pt  entered and exited the pool via stair and handrails independently  Pt pain level *** at initiation of water walking.  Therapeutic Exercise: Walking forward/backwards/side stepping Runners stretch on bottom step x30" BIL Hamstring stretch on bottom step x30" BIL STS from 3rd step from bottom x10 with BIL UE support Lunge walking forwards/sideways x2 laps each Standing thoracic rotation with hands  in neutral for resistance At edge of pool, pt performed LE exercise: Hip abd/add x10 BIL Hip ext/flex with knee straight x 20 BIL Hip Circles CC/CCW x10 each BIL Marching hip flexion to knee extension 2x10 BIL Hamstring curl x20 BIL Squats 2x20 Heel raises 2x20 Sitting on bench in water: Bicycle kicks x1' Reverse bicycle kicks x1' Flutter kicks x1' Scissor kicks x1' Kickboard push/pull x1' Kickboard push downs x1' Kickboard thoracic roation x1'  Pt requires the buoyancy of water for active assisted exercises with buoyancy supported for strengthening and AROM exercises. Hydrostatic pressure also supports joints by unweighting joint load by at least 50 % in 3-4 feet depth water. 80% in chest to neck deep water. Water will provide assistance with movement using the current and laminar flow while the buoyancy reduces weight bearing. Pt requires the viscosity of the water for resistance with strengthening exercises.   [facility] Adult PT Treatment:                                                DATE: 11/09/2021 Aquatic therapy at [address] - therapeutic pool temp 90 degrees Pt enters building 90  Treatment took place in water 3.8 to  4 ft 8 in.feet deep depending upon activity.  Pt entered and exited the pool via stair and handrails independently  Pt pain level 5-6 at initiation of water walking.  Therapeutic Exercise: Walking forward/backwards/side stepping Runners stretch on bottom step x30" BIL Hamstring stretch on bottom step x30" BIL STS from 3rd step from bottom x10 with  BIL UE support Standing thoracic rotation with hands in neutral for resistance At edge of pool, pt performed LE exercise: Hip abd/add x10 BIL Hip ext/flex with knee straight x 20 BIL Hip Circles CC/CCW x10 each BIL Marching hip flexion to knee extension 2x10 BIL Hamstring curl x20 BIL Squats 2x20 Sitting on bench in water: Bicycle kicks x1' Reverse bicycle kicks x1' Flutter kicks x1' Scissor kicks x1' Kickboard push/pull x1' Kickboard push downs x1' Kickboard thoracic roation x1'  Pt requires the buoyancy of water for active assisted exercises with buoyancy supported for strengthening and AROM exercises. Hydrostatic pressure also supports joints by unweighting joint load by at least 50 % in 3-4 feet depth water. 80% in chest to neck deep water. Water will provide assistance with movement using the current and laminar flow while the buoyancy reduces weight bearing. Pt requires the viscosity of the water for resistance with strengthening exercises.    [facility] Adult PT Treatment:                                                DATE: 10/18/21 Therapeutic Exercise: LAQ 2x20 5# Seated march 2x20 5# Seated hamstring curl 2x20 GTB Seated heel/toe on rocker board 2x15 Seated hip adduction ball squeeze 5" hold x10  Seated shoulder OH press YTB 15x(hold on future appointments due to pain and lifting restrictions) Seated Row 2x15 YTB Seated horizontal abduction YTB 2 x 15(R shoulder)stabilizing only Seated bilateral ER YTB 2x15 Seated elbow flexion B YTB 2x15 Seated B hip abduction GTB 15x2    PATIENT EDUCATION:  Education details: continue HEP Person educated: Patient Education method: Explanation, Demonstration, and Handouts Education comprehension: verbalized understanding  and returned demonstration   HOME EXERCISE PROGRAM: Access Code: [email or web address] URL: [email or web address] Date: 08/23/2021 Prepared by: Octavio Manns  Exercises Standing Shoulder Row with Anchored  Resistance - 1 x daily - 7 x weekly - 3 sets - 10 reps Seated Hip Abduction with Resistance - 1 x daily - 7 x weekly - 3 sets - 10 reps Seated March with Resistance - 1 x daily - 7 x weekly - 2 sets - 20 reps Seated Long Arc Quad - 1 x daily - 7 x weekly - 3 sets - 10 reps Seated Shoulder Horizontal Abduction with Resistance - Palms Down - 1 x daily - 7 x weekly - 2 sets - 10 reps     ASSESSMENT:  ***  Patient presents for her first aquatic therapy treatment and reports 5/10 back pain and 6/10 upper neck and R shoulder pain at the beginning of the session. Session today focused on core strengthening, general conditioning, and increasing activity tolerance in the aquatic environment for the use of buoyancy and viscosity to decrease pain and improve functional movement. Patient was able to tolerate all prescribed exercises in the aquatic environment with no adverse effects and reports 5-6/10 pain at the end of the session. Patient continues to benefit from skilled PT services on land and aquatic based and should be progressed as able to improve functional independence.   Problem List: Abnormal gait, decreased activity tolerance, decreased balance, decreased endurance, decreased mobility, difficulty walking, decreased ROM, decreased strength, obesity, and pain     GOALS: Goals reviewed with patient? No   SHORT TERM GOALS:   STG Name Target Date Goal status  1 Pt will be compliant and knowledgeable with initial HEP for improved comfort and carryover Baseline: initial HEP given 08/27/2021 MET  2 Pt will self report low back and neck/R shoulder pain no greater than 6/10 for improved comfort and functional ability Baseline: 10/10 at worst 08/27/2021 ONGOING    LONG TERM GOALS:    LTG Name Target Date Goal status  1 Pt will self report low back and neck/R shoulder pain no greater than 6/10 for improved comfort and functional ability Baseline: 10/10 at worst 09/28/2021: 10 at worst 10/01/2021  PROGRESSING  2 Pt will decrease Quick DASH disability score to no greater than 65% as proxy for functional improvement Baseline: 87% disability 09/28/2021: 62% disability 10/01/2021 ACHIEVED  3 Pt will decrease ODI disability score to no greater than 70% as proxy for functional improvement Baseline: 86% disability 09/28/2021: 66% disability 10/01/2021 ACHIEVED  4 Pt will increase reps in 30 Second Sit to Stand to no less than 5 reps for improved strength and functional mobility Baseline: 2 reps with UE support 09/12/2021: 10 reps with UE support 10/01/2021 ACHIEVED  5 Pt will increase R shoulder flex/abd AROM to no less than 125 deg for improved functional ability with home ADLs Baseline: see chart 09/28/2021: 150 degrees 10/01/2021 ACHIEVED  6 *Added 09/28/2021* Pt will report ability to stand 10 minutes in order to wash dishes with less limitation. Baseline: Pt reports inability to stand 3-4 minutes without need of a seated rest 11/23/2021  INITIAL  7 Patient to ambulate 228f with LRAD on solid ground Baseline: 772fwith rollator Goal status: initial 11/23/2021 INITIAL    PLAN: PT FREQUENCY: 2x/week   PT DURATION: 8 weeks   PLANNED INTERVENTIONS: Therapeutic exercises, Therapeutic activity, Neuro Muscular re-education, Balance training, Gait training, Patient/Family education, Joint mobilization, Aquatic Therapy, Cryotherapy, Moist heat, and Manual therapy   PLAN  FOR NEXT SESSION: Update Hep as needed, continue LE and upper back strengthening tasks, restrict RUE lifting OH per MD restrictions, initiate aquatic PT    Evelene Croon, PTA 11/15/21 12:26 PM

## 2021-11-16 ENCOUNTER — Ambulatory Visit: Payer: Medicaid Other

## 2021-11-19 ENCOUNTER — Other Ambulatory Visit: Payer: Self-pay

## 2021-11-19 ENCOUNTER — Ambulatory Visit: Payer: Medicaid Other | Admitting: Physical Therapy

## 2021-11-19 ENCOUNTER — Encounter: Payer: Self-pay | Admitting: Physical Therapy

## 2021-11-19 DIAGNOSIS — G8929 Other chronic pain: Secondary | ICD-10-CM

## 2021-11-19 DIAGNOSIS — R2689 Other abnormalities of gait and mobility: Secondary | ICD-10-CM

## 2021-11-19 DIAGNOSIS — R262 Difficulty in walking, not elsewhere classified: Secondary | ICD-10-CM

## 2021-11-19 DIAGNOSIS — F4325 Adjustment disorder with mixed disturbance of emotions and conduct: Secondary | ICD-10-CM | POA: Diagnosis not present

## 2021-11-19 DIAGNOSIS — M6281 Muscle weakness (generalized): Secondary | ICD-10-CM | POA: Diagnosis not present

## 2021-11-19 DIAGNOSIS — F4310 Post-traumatic stress disorder, unspecified: Secondary | ICD-10-CM | POA: Diagnosis not present

## 2021-11-19 DIAGNOSIS — R2681 Unsteadiness on feet: Secondary | ICD-10-CM

## 2021-11-19 NOTE — Therapy (Signed)
OUTPATIENT PHYSICAL THERAPY TREATMENT NOTE   Patient Name: Melinda [name] MRN: [id number] DOB:1966/06/06, 56 y.o., female Today's Date: 11/19/2021  PCP: [name], MD REFERRING PROVIDER: [name], MD   PT End of Session - 11/19/21 1153     Visit Number 14    Number of Visits 22    Date for PT Re-Evaluation 01/14/22    Authorization Type [employer] MCD    Authorization - Number of Visits 45    PT Start Time 1215    PT Stop Time 1253    PT Time Calculation (min) 38 min    Activity Tolerance Patient tolerated treatment well    Behavior During Therapy WFL for tasks assessed/performed              Past Medical History:  Diagnosis Date   ADHD (attention deficit hyperactivity disorder)    Arthritis    Asthma    Cancer ([place])    Thyroid   Chicken pox AGE 37   COPD (chronic obstructive pulmonary disease) ([place])    pt reported   Diabetes mellitus without complication ([place])    Diverticulosis, sigmoid    GERD (gastroesophageal reflux disease)    Glaucoma    BOTH EYES, NO EYE DROPS   Glaucoma    History of blood transfusion 08/2014    2UNITS GIVEN AND IRON GIVEN   Hyperglycemia 09/09/2014   Hypertension    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 ([place]) AGE 65   HAD TO LEARN TO WALK AGAIN   Iron deficiency anemia due to chronic blood loss 09/09/2014   Low TSH level 09/08/2014   Lumbar herniated disc    Menorrhagia 09/08/2014   Migraine    CLUSTER AND MIGRAINES   Multiple thyroid nodules    Ovarian mass, right 09/09/2014   PE (pulmonary embolism) 2016   Peripheral neuropathy    FINGER TIPS AND TOES NUMB SOME   Preeclampsia 1983   WITH PREGNANCY   PTSD (post-traumatic stress disorder)    Scoliosis    Seizures ([place]) AGE 37   NONE SINCE, HAD CHICKEN POX THEN   Past Surgical History:  Procedure Laterality Date   ANGIOGRAM TO LEG  08-13-15   RIGHT   CHOLECYSTECTOMY     IR GENERIC HISTORICAL  04/04/2016   IR RADIOLOGIST EVAL & MGMT  04/04/2016 [name], MD [facility] INTERV RAD   RADIOLOGY WITH ANESTHESIA N/A 07/17/2021   Procedure: MRI LUMBAR SPINE WITHOUT CONTRAST; MRI CERVICAL SPINE WITHOUT CONTRAST WITH ANESTHESIA;  Surgeon: Radiologist, Medication, MD;  Location: [facility] OR;  Service: Radiology;  Laterality: N/A;   THYROIDECTOMY N/A 11/17/2015   Procedure: TOTAL THYROIDECTOMY;  Surgeon: Armandina Gemma, MD;  Location: [facility];  Service: General;  Laterality: N/A;   UTERINE ARTERY EMBOLIZATION Bilateral 08/13/2015   Patient Active Problem List   Diagnosis Date Noted   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   Abdominal pannus 08/21/2021   Aortic atherosclerosis ([place]) 08/21/2021   At high risk for falls 04/20/2020   Hyperlipidemia 02/16/2020   Adrenal mass, right ([place]) 04/29/2019   Chronic cough 03/12/2018   Left shoulder pain 10/14/2016   Hypertension 10/14/2016   S/P thyroidectomy 06/27/2016   Chronic bilateral low back pain without sciatica 04/22/2016   Liver lesion 12/24/2015   Controlled type 2 diabetes mellitus with complication, with long-term current use of insulin ([place]) 10/10/2015   Vision loss 08/25/2015   Decreased vision in both eyes 08/25/2015   Prolonged Q-T interval on ECG  08/19/2015   History of pulmonary embolism 08/10/2015   Mild persistent asthma 04/13/2015   Fibroid, uterine    Morbid obesity ([place]) 11/11/2014   Atypical chest pain 11/08/2014   Sinus tachycardia 11/08/2014   Migraine 10/14/2014   Menorrhagia 09/08/2014    REFERRING DIAG:  M48.02 (ICD-10-CM) - Spinal stenosis of cervical region M47.26 (ICD-10-CM) - Other spondylosis with radiculopathy, lumbar region M50.30 (ICD-10-CM) - Degenerative disc disease, cervical M47.812 (ICD-10-CM) - Spondylosis of cervical spine M77.8 (ICD-10-CM) - Tendinitis of shoulder, unspecified laterality  THERAPY DIAG:  Muscle weakness (generalized)  Difficulty in walking, not elsewhere classified  Other abnormalities of gait and  mobility  Unsteadiness on feet  Chronic right shoulder pain  PERTINENT HISTORY:  HTN, peripheral neuropathy, DM II, PE, thyroid cancer, COPD  PRECAUTIONS: Fall, no OH lifting per Dr. [name]  SUBJECTIVE: Patient reports pain mainly in lower back and left hip/leg. She also cut her left big toe and that is still bothering her.   Pain: Are you having pain? Yes NPRS: 8/10 LBP, 0/10 neck and RUE  Pain location: see above PAIN TYPE: sharp Pain description: intermittent  Aggravating factors: prolonged standing, walking Relieving factors: rest   OBJECTIVE:  *Unless otherwise noted, objective information collected previously*   PATIENT SURVEYS:  Modified Oswestry 86% disability; 09/28/2021: 66% disability Quick Dash 87% disability; 09/28/2021: 62% disability    CERVICAL AROM/PROM   A/PROM A/PROM (deg) 08/06/2021 AROM 09/28/2021  Right rotation 43 p! 42 minor pain  Left rotation 35 48 minor pain   (Blank rows = not tested)   UE AROM/PROM:   A/PROM Right 08/06/2021 Left 08/06/2021 Right 09/28/2021  Shoulder flexion 82 WFL 150 p! At end range  Shoulder extension       Shoulder abduction 75 WFL 128 p! At end range  Shoulder adduction       Shoulder extension       Shoulder internal rotation 15 WFL 70  Shoulder external rotation 20 WFL 15  Elbow flexion       Elbow extension       Wrist flexion       Wrist extension       Wrist ulnar deviation       Wrist radial deviation       Wrist pronation       Wrist supination        (Blank rows = not tested)   UE MMT:   MMT Right 08/06/2021 Left 08/06/2021 Right 09/28/2021 Left 09/28/2021  Shoulder flexion 2+/5 3+/5 4/5 4/5  Shoulder abduction 2+/5 3+/5 4/5 4/5  Shoulder internal rotation 3/5 3+/5 5/5 5/5  Shoulder external rotation 35 3+/5 4/5 4/5  Grip strength 55# 54#     (Blank rows = not tested)   LE MMT:   MMT Right 08/06/2021 Left 08/06/2021 Right 09/14/2021 Left 09/14/2021  Hip flexion 3+/5 2+/5 4/5 3/5  Hip abduction 3+/5  2+/5    Hip adduction 3+/5 3/5    Knee flexion 4/5 3/5 4+/5 3+/5  Knee extension 4/5 3/5 4/5 3+/5   (Blank rows = not tested)   Orthostatics:         Sitting: 114/88            Standing: 114/84    FUNCTIONAL TESTS:  30 Second Sit to Stand: 2 reps 09/12/2021: 10 reps with UE support  Patient able to ambulate 165 ft using rollator before needing seated rest break - 11/19/2021    TODAY'S TREATMENT:  [facility] Adult PT Treatment:  DATE: 11/19/2021 Therapeutic Exercise: NuStep L5 x 5 min Seated hip abduction with green 2 x 20 Seated march with green 2 x 20 Seated horizontal abduction with yellow 2 x 20 Seated double ER with yellow 2 x 20 Seated row with yellow 2 x 20 LAQ with 5# 2 x 20  Therapeutic Activity: Ambulating 165 ft and review and assessment of progress toward goals for PT POC re-certification   [facility] Adult PT Treatment:                                                DATE: 10/18/21 Therapeutic Exercise: LAQ 2x20 5# Seated march 2x20 5# Seated hamstring curl 2x20 GTB Seated heel/toe on rocker board 2x15 Seated hip adduction ball squeeze 5" hold x10  Seated shoulder OH press YTB 15x(hold on future appointments due to pain and lifting restrictions) Seated Row 2x15 YTB Seated horizontal abduction YTB 2 x 15(R shoulder)stabilizing only Seated bilateral ER YTB 2x15 Seated elbow flexion B YTB 2x15 Seated B hip abduction GTB 15x2  [facility] Adult PT Treatment:                                                DATE: 10/11/21 Therapeutic Exercise: LAQ 2x15 5# Seated march 2x15 5# Seated hamstring curl 2x15 GTB Seated heel/toe on rocker board 2x15 Seated hip adduction ball squeeze 5" hold x10 (pt doesn't enjoy due to body habitus) Seated shoulder OH press holding tennis ball 15x Seated Row 2x15 GTB Seated horizontal abduction GTB 2 x 15 (small range to prevent pain in R shoulder) Seated bilateral ER GTB 2x15 Seated elbow flexion B GTB  2x15 Seated B hip abduction GTB 15x2    PATIENT EDUCATION:  Education details: continue HEP Person educated: Patient Education method: Explanation, Demonstration, and Handouts Education comprehension: verbalized understanding and returned demonstration   HOME EXERCISE PROGRAM: Access Code: [email or web address] URL: [email or web address] Date: 08/23/2021 Prepared by: Octavio Manns  Exercises Standing Shoulder Row with Anchored Resistance - 1 x daily - 7 x weekly - 3 sets - 10 reps Seated Hip Abduction with Resistance - 1 x daily - 7 x weekly - 3 sets - 10 reps Seated March with Resistance - 1 x daily - 7 x weekly - 2 sets - 20 reps Seated Long Arc Quad - 1 x daily - 7 x weekly - 3 sets - 10 reps Seated Shoulder Horizontal Abduction with Resistance - Palms Down - 1 x daily - 7 x weekly - 2 sets - 10 reps     ASSESSMENT: Patient will poor tolerance for therapy due to increased pain with any standing/walking and she began to report an upset stomach during therapy so therapy cut short due to patient not being able to perform exercise. She does demonstrate improved walking ability this visit and improved standing tolerance, but continues with high levels of pain that limit her functional activity level. She seemed to tolerate aquatic therapy well so will update PT POC to further include aquatic therapy to progress her functional ability. Patient would benefit from continued skilled PT to progress her mobility and strength in order to reduce pain and maximize functional ability.   Problem List: Abnormal gait, decreased activity tolerance, decreased balance, decreased endurance, decreased  mobility, difficulty walking, decreased ROM, decreased strength, obesity, and pain     GOALS: Goals reviewed with patient? Yes   SHORT TERM GOALS:   STG Name Target Date Goal status  1 Pt will be compliant and knowledgeable with initial HEP for improved comfort and carryover Baseline: initial HEP  given 08/27/2021 MET  2 Pt will self report low back and neck/R shoulder pain no greater than 6/10 for improved comfort and functional ability Baseline: 10/10 at worst 11/19/2021: patient reports 8/10 pain level 12/17/2021 PARTIALLY MET    LONG TERM GOALS:    LTG Name Target Date Goal status  1 Pt will self report low back and neck/R shoulder pain no greater than 6/10 for improved comfort and functional ability Baseline: 10/10 at worst 09/28/2021: 10 at worst 11/19/2021: patient reports 8/10 pain level 01/14/2022 PROGRESSING  2 Pt will decrease Quick DASH disability score to no greater than 65% as proxy for functional improvement Baseline: 87% disability 09/28/2021: 62% disability 10/01/2021 ACHIEVED  3 Pt will decrease ODI disability score to no greater than 70% as proxy for functional improvement Baseline: 86% disability 09/28/2021: 66% disability 10/01/2021 ACHIEVED  4 Pt will increase reps in 30 Second Sit to Stand to no less than 5 reps for improved strength and functional mobility Baseline: 2 reps with UE support 09/12/2021: 10 reps with UE support 10/01/2021 ACHIEVED  5 Pt will increase R shoulder flex/abd AROM to no less than 125 deg for improved functional ability with home ADLs Baseline: see chart 09/28/2021: 150 degrees 10/01/2021 ACHIEVED  6 *Added 09/28/2021* Pt will report ability to stand 10 minutes in order to wash dishes with less limitation. Baseline: Pt reports inability to stand 3-4 minutes without need of a seated rest 11/19/2021: around 5 minutes 01/14/2022  PARTIALLY MET  7 Patient to ambulate 288f with LRAD on solid ground Baseline: 724fwith rollator Goal status: initial 11/19/2021: 165 ft with rollator 01/14/2022 PARTIALLY MET    PLAN: PT FREQUENCY: 1x/week   PT DURATION: 8 weeks   PLANNED INTERVENTIONS: Therapeutic exercises, Therapeutic activity, Neuro Muscular re-education, Balance training, Gait training, Patient/Family education, Joint mobilization, Aquatic Therapy,  Cryotherapy, Moist heat, and Manual therapy   PLAN FOR NEXT SESSION: Update HEP as needed, continue LE and upper back strengthening tasks, restrict RUE lifting OH per MD restrictions, initiate aquatic PT    Check all possible CPT codes: 97164 - Re-evaluation, 97110- Therapeutic Exercise, [phone] Re-education, [phone] Gait Training, [phone] Manual Therapy, 97530 - Therapeutic Activities, and [phone] Aquatic therapy     If treatment provided at initial evaluation, no treatment charged due to lack of authorization.        CaHilda BladesPT, DPT, LAT, ATC 11/19/21  12:59 PM Phone: [phone]: [phone]

## 2021-11-19 NOTE — Therapy (Deleted)
OUTPATIENT PHYSICAL THERAPY TREATMENT NOTE   Patient Name: Melinda [name] MRN: [id number] DOB:07-20-1965, 56 y.o., female Today's Date: 11/19/2021  PCP: [name], MD REFERRING PROVIDER: [name], MD      Past Medical History:  Diagnosis Date   ADHD (attention deficit hyperactivity disorder)    Arthritis    Asthma    Cancer ([place])    Thyroid   Chicken pox AGE 80   COPD (chronic obstructive pulmonary disease) ([place])    pt reported   Diabetes mellitus without complication ([place])    Diverticulosis, sigmoid    GERD (gastroesophageal reflux disease)    Glaucoma    BOTH EYES, NO EYE DROPS   Glaucoma    History of blood transfusion 08/2014    2UNITS GIVEN AND IRON GIVEN   Hyperglycemia 09/09/2014   Hypertension    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 ([place]) AGE 32   HAD TO LEARN TO WALK AGAIN   Iron deficiency anemia due to chronic blood loss 09/09/2014   Low TSH level 09/08/2014   Lumbar herniated disc    Menorrhagia 09/08/2014   Migraine    CLUSTER AND MIGRAINES   Multiple thyroid nodules    Ovarian mass, right 09/09/2014   PE (pulmonary embolism) 2016   Peripheral neuropathy    FINGER TIPS AND TOES NUMB SOME   Preeclampsia 1983   WITH PREGNANCY   PTSD (post-traumatic stress disorder)    Scoliosis    Seizures ([place]) AGE 80   NONE SINCE, HAD CHICKEN POX THEN   Past Surgical History:  Procedure Laterality Date   ANGIOGRAM TO LEG  08-13-15   RIGHT   CHOLECYSTECTOMY     IR GENERIC HISTORICAL  04/04/2016   IR RADIOLOGIST EVAL & MGMT 04/04/2016 [name], MD [facility] INTERV RAD   RADIOLOGY WITH ANESTHESIA N/A 07/17/2021   Procedure: MRI LUMBAR SPINE WITHOUT CONTRAST; MRI CERVICAL SPINE WITHOUT CONTRAST WITH ANESTHESIA;  Surgeon: Radiologist, Medication, MD;  Location: [facility];  Service: Radiology;  Laterality: N/A;   THYROIDECTOMY N/A 11/17/2015   Procedure: TOTAL THYROIDECTOMY;  Surgeon: Armandina Gemma, MD;  Location: [facility];   Service: General;  Laterality: N/A;   UTERINE ARTERY EMBOLIZATION Bilateral 08/13/2015   Patient Active Problem List   Diagnosis Date Noted   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   Abdominal pannus 08/21/2021   Aortic atherosclerosis ([place]) 08/21/2021   At high risk for falls 04/20/2020   Hyperlipidemia 02/16/2020   Adrenal mass, right ([place]) 04/29/2019   Chronic cough 03/12/2018   Left shoulder pain 10/14/2016   Hypertension 10/14/2016   S/P thyroidectomy 06/27/2016   Chronic bilateral low back pain without sciatica 04/22/2016   Liver lesion 12/24/2015   Controlled type 2 diabetes mellitus with complication, with long-term current use of insulin ([place]) 10/10/2015   Vision loss 08/25/2015   Decreased vision in both eyes 08/25/2015   Prolonged Q-T interval on ECG 08/19/2015   History of pulmonary embolism 08/10/2015   Mild persistent asthma 04/13/2015   Fibroid, uterine    Morbid obesity ([place]) 11/11/2014   Atypical chest pain 11/08/2014   Sinus tachycardia 11/08/2014   Migraine 10/14/2014   Menorrhagia 09/08/2014    REFERRING DIAG:  M48.02 (ICD-10-CM) - Spinal stenosis of cervical region M47.26 (ICD-10-CM) - Other spondylosis with radiculopathy, lumbar region M50.30 (ICD-10-CM) - Degenerative disc disease, cervical M47.812 (ICD-10-CM) - Spondylosis of cervical spine M77.8 (ICD-10-CM) - Tendinitis of shoulder, unspecified laterality  THERAPY DIAG:  No diagnosis found.  PERTINENT HISTORY:  HTN, peripheral neuropathy, DM II, PE, thyroid cancer, COPD  PRECAUTIONS: Fall, no OH lifting per Dr. [name]  SUBJECTIVE: Patient reports she got an steroid injection in her lower back on Monday.  Pain: Are you having pain? Yes NPRS: 5/10 LBP, 6/10 neck and RUE  Pain location: bilateral knees PAIN TYPE: sharp Pain description: intermittent  Aggravating factors: prolonged standing, walking Relieving factors: rest   OBJECTIVE:  *Unless otherwise noted, objective  information collected previously*   PATIENT SURVEYS:  Modified Oswestry 86% disability; 09/28/2021: 66% disability Quick Dash 87% disability; 09/28/2021: 62% disability    CERVICAL AROM/PROM   A/PROM A/PROM (deg) 08/06/2021 AROM 09/28/2021 AROM 11/19/21  Right rotation 43 p! 42 minor pain   Left rotation 35 48 minor pain    (Blank rows = not tested)   UE AROM/PROM:   A/PROM Right 08/06/2021 Left 08/06/2021 Right 09/28/2021 R 11/19/21  Shoulder flexion 82 WFL 150 p! At end range   Shoulder extension        Shoulder abduction 75 WFL 128 p! At end range   Shoulder adduction        Shoulder extension        Shoulder internal rotation 15 WFL 70   Shoulder external rotation 20 WFL 15   Elbow flexion        Elbow extension        Wrist flexion        Wrist extension        Wrist ulnar deviation        Wrist radial deviation        Wrist pronation        Wrist supination         (Blank rows = not tested)   UE MMT:   MMT Right 08/06/2021 Left 08/06/2021 Right 09/28/2021 Left 09/28/2021  Shoulder flexion 2+/5 3+/5 4/5 4/5  Shoulder abduction 2+/5 3+/5 4/5 4/5  Shoulder internal rotation 3/5 3+/5 5/5 5/5  Shoulder external rotation 35 3+/5 4/5 4/5  Grip strength 55# 54#     (Blank rows = not tested)   LE MMT:   MMT Right 08/06/2021 Left 08/06/2021 Right 09/14/2021 Left 09/14/2021 R 11/19/21 L 11/19/21  Hip flexion 3+/5 2+/5 4/5 3/5    Hip abduction 3+/5 2+/5      Hip adduction 3+/5 3/5      Knee flexion 4/5 3/5 4+/5 3+/5    Knee extension 4/5 3/5 4/5 3+/5     (Blank rows = not tested)   Orthostatics:         Sitting: 114/88            Standing: 114/84    FUNCTIONAL TESTS:  30 Second Sit to Stand: 2 reps;  09/12/2021: 10 reps with UE support 11/19/21:    TODAY'S TREATMENT:  [facility] Adult PT Treatment:                                                DATE: 11/09/2021 Aquatic therapy at [address] - therapeutic pool temp 90 degrees Pt enters building 90   Treatment took place in water 3.8 to  4 ft 8 in.feet deep depending upon activity.  Pt entered and exited the pool via stair and handrails independently  Pt pain level 5-6 at initiation of water walking.  Therapeutic Exercise: Walking forward/backwards/side stepping  Runners stretch on bottom step x30" BIL Hamstring stretch on bottom step x30" BIL STS from 3rd step from bottom x10 with BIL UE support Standing thoracic rotation with hands in neutral for resistance At edge of pool, pt performed LE exercise: Hip abd/add x10 BIL Hip ext/flex with knee straight x 20 BIL Hip Circles CC/CCW x10 each BIL Marching hip flexion to knee extension 2x10 BIL Hamstring curl x20 BIL Squats 2x20 Sitting on bench in water: Bicycle kicks x1' Reverse bicycle kicks x1' Flutter kicks x1' Scissor kicks x1' Kickboard push/pull x1' Kickboard push downs x1' Kickboard thoracic roation x1'  Pt requires the buoyancy of water for active assisted exercises with buoyancy supported for strengthening and AROM exercises. Hydrostatic pressure also supports joints by unweighting joint load by at least 50 % in 3-4 feet depth water. 80% in chest to neck deep water. Water will provide assistance with movement using the current and laminar flow while the buoyancy reduces weight bearing. Pt requires the viscosity of the water for resistance with strengthening exercises.    [facility] Adult PT Treatment:                                                DATE: 10/18/21 Therapeutic Exercise: LAQ 2x20 5# Seated march 2x20 5# Seated hamstring curl 2x20 GTB Seated heel/toe on rocker board 2x15 Seated hip adduction ball squeeze 5" hold x10  Seated shoulder OH press YTB 15x(hold on future appointments due to pain and lifting restrictions) Seated Row 2x15 YTB Seated horizontal abduction YTB 2 x 15(R shoulder)stabilizing only Seated bilateral ER YTB 2x15 Seated elbow flexion B YTB 2x15 Seated B hip abduction GTB 15x2  [facility] Adult PT  Treatment:                                                DATE: 10/11/21 Therapeutic Exercise: LAQ 2x15 5# Seated march 2x15 5# Seated hamstring curl 2x15 GTB Seated heel/toe on rocker board 2x15 Seated hip adduction ball squeeze 5" hold x10 (pt doesn't enjoy due to body habitus) Seated shoulder OH press holding tennis ball 15x Seated Row 2x15 GTB Seated horizontal abduction GTB 2 x 15 (small range to prevent pain in R shoulder) Seated bilateral ER GTB 2x15 Seated elbow flexion B GTB 2x15 Seated B hip abduction GTB 15x2    PATIENT EDUCATION:  Education details: continue HEP Person educated: Patient Education method: Explanation, Demonstration, and Handouts Education comprehension: verbalized understanding and returned demonstration   HOME EXERCISE PROGRAM: Access Code: [email or web address] URL: [email or web address] Date: 08/23/2021 Prepared by: Octavio Manns  Exercises Standing Shoulder Row with Anchored Resistance - 1 x daily - 7 x weekly - 3 sets - 10 reps Seated Hip Abduction with Resistance - 1 x daily - 7 x weekly - 3 sets - 10 reps Seated March with Resistance - 1 x daily - 7 x weekly - 2 sets - 20 reps Seated Long Arc Quad - 1 x daily - 7 x weekly - 3 sets - 10 reps Seated Shoulder Horizontal Abduction with Resistance - Palms Down - 1 x daily - 7 x weekly - 2 sets - 10 reps     ASSESSMENT:  Patient presents for her first aquatic therapy treatment and reports  5/10 back pain and 6/10 upper neck and R shoulder pain at the beginning of the session. Session today focused on core strengthening, general conditioning, and increasing activity tolerance in the aquatic environment for the use of buoyancy and viscosity to decrease pain and improve functional movement. Patient was able to tolerate all prescribed exercises in the aquatic environment with no adverse effects and reports 5-6/10 pain at the end of the session. Patient continues to benefit from skilled PT services on land  and aquatic based and should be progressed as able to improve functional independence.   No changes to note, progress limited by co-morbidities and inability to tolerate supine or standing positions as a result.  Decreased UE resistance to YTB so as to not aggravate R shoulder.  Increased LE reps to 2x20.  Attempting to initiate aquatics to better manage restrictions from co-mobidities.  Problem List: Abnormal gait, decreased activity tolerance, decreased balance, decreased endurance, decreased mobility, difficulty walking, decreased ROM, decreased strength, obesity, and pain     GOALS: Goals reviewed with patient? No   SHORT TERM GOALS:   STG Name Target Date Goal status  1 Pt will be compliant and knowledgeable with initial HEP for improved comfort and carryover Baseline: initial HEP given 08/27/2021 MET  2 Pt will self report low back and neck/R shoulder pain no greater than 6/10 for improved comfort and functional ability Baseline: 10/10 at worst 08/27/2021 ONGOING    LONG TERM GOALS:    LTG Name Target Date Goal status  1 Pt will self report low back and neck/R shoulder pain no greater than 6/10 for improved comfort and functional ability Baseline: 10/10 at worst 09/28/2021: 10 at worst 10/01/2021 PROGRESSING  2 Pt will decrease Quick DASH disability score to no greater than 65% as proxy for functional improvement Baseline: 87% disability 09/28/2021: 62% disability 10/01/2021 ACHIEVED  3 Pt will decrease ODI disability score to no greater than 70% as proxy for functional improvement Baseline: 86% disability 09/28/2021: 66% disability 10/01/2021 ACHIEVED  4 Pt will increase reps in 30 Second Sit to Stand to no less than 5 reps for improved strength and functional mobility Baseline: 2 reps with UE support 09/12/2021: 10 reps with UE support 10/01/2021 ACHIEVED  5 Pt will increase R shoulder flex/abd AROM to no less than 125 deg for improved functional ability with home ADLs Baseline: see  chart 09/28/2021: 150 degrees 10/01/2021 ACHIEVED  6 *Added 09/28/2021* Pt will report ability to stand 10 minutes in order to wash dishes with less limitation. Baseline: Pt reports inability to stand 3-4 minutes without need of a seated rest 11/23/2021  INITIAL  7 Patient to ambulate 270f with LRAD on solid ground Baseline: 766fwith rollator Goal status: initial 11/23/2021 INITIAL    PLAN: PT FREQUENCY: 2x/week   PT DURATION: 8 weeks   PLANNED INTERVENTIONS: Therapeutic exercises, Therapeutic activity, Neuro Muscular re-education, Balance training, Gait training, Patient/Family education, Joint mobilization, Aquatic Therapy, Cryotherapy, Moist heat, and Manual therapy   PLAN FOR NEXT SESSION: Update Hep as needed, continue LE and upper back strengthening tasks, restrict RUE lifting OH per MD restrictions, initiate aquatic PT   Check all possible CPT codes: 97164 - Re-evaluation, 97110- Therapeutic Exercise, [id number] Re-education, [id number] Gait Training, [id number] Manual Therapy, 97530 - Therapeutic Activities, and [phone] Aquatic therapy     If treatment provided at initial evaluation, no treatment charged due to lack of authorization.     StEvelene CroonPT[place]5/22/23 9:43 AM

## 2021-11-21 ENCOUNTER — Ambulatory Visit: Payer: Medicaid Other | Admitting: Surgery

## 2021-11-21 ENCOUNTER — Encounter: Payer: Self-pay | Admitting: Podiatry

## 2021-11-21 ENCOUNTER — Ambulatory Visit: Payer: Medicaid Other | Admitting: Podiatry

## 2021-11-21 DIAGNOSIS — S91109A Unspecified open wound of unspecified toe(s) without damage to nail, initial encounter: Secondary | ICD-10-CM | POA: Diagnosis not present

## 2021-11-21 DIAGNOSIS — M79674 Pain in right toe(s): Secondary | ICD-10-CM | POA: Diagnosis not present

## 2021-11-21 DIAGNOSIS — B351 Tinea unguium: Secondary | ICD-10-CM

## 2021-11-21 DIAGNOSIS — M79675 Pain in left toe(s): Secondary | ICD-10-CM

## 2021-11-21 DIAGNOSIS — Z794 Long term (current) use of insulin: Secondary | ICD-10-CM

## 2021-11-21 DIAGNOSIS — M4800 Spinal stenosis, site unspecified: Secondary | ICD-10-CM | POA: Insufficient documentation

## 2021-11-21 DIAGNOSIS — E118 Type 2 diabetes mellitus with unspecified complications: Secondary | ICD-10-CM

## 2021-11-21 MED ORDER — SILVER SULFADIAZINE 1 % EX CREA
1.0000 | TOPICAL_CREAM | Freq: Every day | CUTANEOUS | 0 refills | Status: DC
Start: 2021-11-21 — End: 2022-01-18

## 2021-11-21 NOTE — Patient Instructions (Signed)
DRESSING CHANGES left great toe:   PHARMACY SHOPPING LIST: Saline or Wound Cleanser for cleaning wound 2 x 2 inch sterile gauze for cleaning wound silvadene cream  A. IF DISPENSED, WEAR SURGICAL SHOE OR WALKING BOOT AT ALL TIMES.  B. IF PRESCRIBED ORAL ANTIBIOTICS, TAKE ALL MEDICATION AS PRESCRIBED UNTIL ALL ARE GONE.  C. IF DOCTOR HAS DESIGNATED NONWEIGHTBEARING STATUS, PLEASE ADHERE TO INSTRUCTIONS.   CLEANSE TOE WITH SALINE OR WOUND CLEANSER.  DAB DRY WITH GAUZE SPONGE.  APPLY A LIGHT AMOUNT OF silvadene cream TO BASE OF ULCER.  APPLY FABRIC BAND-AID  IF YOU EXPERIENCE ANY FEVER, CHILLS, NIGHTSWEATS, NAUSEA OR VOMITING, ELEVATED OR LOW BLOOD SUGARS, REPORT TO EMERGENCY ROOM.  IF YOU EXPERIENCE INCREASED REDNESS, PAIN, SWELLING, DISCOLORATION, ODOR, PUS, DRAINAGE OR WARMTH OF YOUR FOOT, REPORT TO EMERGENCY ROOM.

## 2021-11-22 ENCOUNTER — Telehealth: Payer: Self-pay | Admitting: Specialist

## 2021-11-22 ENCOUNTER — Ambulatory Visit: Payer: Medicaid Other | Admitting: Specialist

## 2021-11-22 NOTE — Telephone Encounter (Signed)
Re scheduled tpo 12/10/21 @ 230

## 2021-11-22 NOTE — Telephone Encounter (Signed)
Pt called needing to reschedule with Dr. [name]. Dr. [name] has no openings and pt would like to see Dr. [name]. Can you please reach out to pt for an appt. Pt phone number is [phone]

## 2021-11-25 NOTE — Progress Notes (Signed)
   Artavia Jeanlouis - 56 y.o. female MRN [id number]  Date of birth: Mar 14, 1966  Office Visit Note: Visit Date: 11/05/2021 PCP: [name], MD Referred by: [name], MD  Subjective: Chief Complaint  Patient presents with   Lower Back - Pain   HPI:  Shailynn Fong is a 56 y.o. female who comes in today at the request of Dr. Basil Dess for planned Left L5-S1 Lumbar Interlaminar epidural steroid injection with fluoroscopic guidance.  The patient has failed conservative care including home exercise, medications, time and activity modification.  This injection will be diagnostic and hopefully therapeutic.  Please see requesting physician notes for further details and justification.  ROS Otherwise per HPI.  Assessment & Plan: Visit Diagnoses:    ICD-10-CM   1. Lumbar radiculopathy  M54.16 XR C-ARM NO REPORT    methylPREDNISolone acetate (DEPO-MEDROL) injection 80 mg    Epidural Steroid injection      Plan: No additional findings.   Meds & Orders:  Meds ordered this encounter  Medications   methylPREDNISolone acetate (DEPO-MEDROL) injection 80 mg    Orders Placed This Encounter  Procedures   XR C-ARM NO REPORT   Epidural Steroid injection    Follow-up: Return for visit to requesting provider as needed.   Procedures: No procedures performed  Lumbar Epidural Steroid Injection - Interlaminar Approach with Fluoroscopic Guidance  Patient: Clotile Whittington      Date of Birth: Apr 26, 1966 MRN: [id number] PCP: [name], MD      Visit Date: 11/05/2021   Universal Protocol:     Consent Given By: the patient  Position: PRONE  Additional Comments: Vital signs were monitored before and after the procedure. Patient was prepped and draped in the usual sterile fashion. The correct patient, procedure, and site was verified.   Injection Procedure Details:   Procedure diagnoses: Lumbar radiculopathy [M54.16]   Meds Administered:  Meds ordered this encounter   Medications   methylPREDNISolone acetate (DEPO-MEDROL) injection 80 mg     Laterality: Left  Location/Site:  L5-S1  Needle: 3.5 in., 20 ga. Tuohy  Needle Placement: Paramedian epidural  Findings:   -Comments: Excellent flow of contrast into the epidural space.  Procedure Details: Using a paramedian approach from the side mentioned above, the region overlying the inferior lamina was localized under fluoroscopic visualization and the soft tissues overlying this structure were infiltrated with 4 ml. of 1% Lidocaine without Epinephrine. The Tuohy needle was inserted into the epidural space using a paramedian approach.   The epidural space was localized using loss of resistance along with counter oblique bi-planar fluoroscopic views.  After negative aspirate for air, blood, and CSF, a 2 ml. volume of Isovue-250 was injected into the epidural space and the flow of contrast was observed. Radiographs were obtained for documentation purposes.    The injectate was administered into the level noted above.   Additional Comments:  The patient tolerated the procedure well Dressing: 2 x 2 sterile gauze and Band-Aid    Post-procedure details: Patient was observed during the procedure. Post-procedure instructions were reviewed.  Patient left the clinic in stable condition.   Clinical History: No specialty comments available.     Objective:  VS:  HT:    WT:   BMI:     BP:136/83  HR:92bpm  TEMP: ( )  RESP:  Physical Exam   Imaging: No results found.

## 2021-11-25 NOTE — Procedures (Signed)
Lumbar Epidural Steroid Injection - Interlaminar Approach with Fluoroscopic Guidance  Patient: Melinda [name]      Date of Birth: Oct 20, 1965 MRN: [id number] PCP: [name], MD      Visit Date: 11/05/2021   Universal Protocol:     Consent Given By: the patient  Position: PRONE  Additional Comments: Vital signs were monitored before and after the procedure. Patient was prepped and draped in the usual sterile fashion. The correct patient, procedure, and site was verified.   Injection Procedure Details:   Procedure diagnoses: Lumbar radiculopathy [M54.16]   Meds Administered:  Meds ordered this encounter  Medications   methylPREDNISolone acetate (DEPO-MEDROL) injection 80 mg     Laterality: Left  Location/Site:  L5-S1  Needle: 3.5 in., 20 ga. Tuohy  Needle Placement: Paramedian epidural  Findings:   -Comments: Excellent flow of contrast into the epidural space.  Procedure Details: Using a paramedian approach from the side mentioned above, the region overlying the inferior lamina was localized under fluoroscopic visualization and the soft tissues overlying this structure were infiltrated with 4 ml. of 1% Lidocaine without Epinephrine. The Tuohy needle was inserted into the epidural space using a paramedian approach.   The epidural space was localized using loss of resistance along with counter oblique bi-planar fluoroscopic views.  After negative aspirate for air, blood, and CSF, a 2 ml. volume of Isovue-250 was injected into the epidural space and the flow of contrast was observed. Radiographs were obtained for documentation purposes.    The injectate was administered into the level noted above.   Additional Comments:  The patient tolerated the procedure well Dressing: 2 x 2 sterile gauze and Band-Aid    Post-procedure details: Patient was observed during the procedure. Post-procedure instructions were reviewed.  Patient left the clinic in stable  condition.

## 2021-11-26 NOTE — Progress Notes (Signed)
Subjective:  Patient ID: Melinda [name], female    DOB: [birth year],  MRN: [id number]  Melinda [name] presents to clinic today for preventative diabetic foot care and painful elongated mycotic toenails 1-5 bilaterally which are tender when wearing enclosed shoe gear. Pain is relieved with periodic professional debridement.  Last known HgA1c was 5.9%. Patient did not check blood glucose today.  New problem(s): with chief concern of injury to L hallux. Injury occurred two weeks ago. Injury recurred as a result of a door scraping the top of her toe. Patient states aggravating factor(s) is/are shoe gear.  Patient has seen Dr. [name] in Orthopedics and prescribed Betadine dressings. Xrays were negative for fracture.  PCP is [name], MD , and last visit was August 21, 2021.  Allergies  Allergen Reactions   Ceftriaxone Anaphylaxis    ROCEPHIN   Metformin Anaphylaxis    ALL   Penicillins Shortness Of Breath    Has patient had a PCN reaction causing immediate rash, facial/tongue/throat swelling, SOB or lightheadedness with hypotension: Yes Has patient had a PCN reaction causing severe rash involving mucus membranes or skin necrosis: No Has patient had a PCN reaction that required hospitalization No Has patient had a PCN reaction occurring within the last 10 years: No If all of the above answers are "NO", then [name] proceed with Cephalosporin use.    Shellfish Allergy Anaphylaxis   Flonase [Fluticasone Propionate]     Makes migraines worse   Gold-Containing Drug Products     HANDS ITCH   Nickel     HANDS SWELL   Citrus Rash    Review of Systems: Negative except as noted in the HPI.  Objective: No changes noted in today's physical examination.   Objective:   Vascular Examination: Vascular status intact b/l with palpable pedal pulses. Pedal hair present b/l. CFT <3 seconds b/l.  No edema. No pain with calf compression b/l. Skin temperature gradient WNL b/l. No pain with calf  compression b/l. No ischemia or gangrene noted b/l LE. No cyanosis or clubbing noted b/l LE.  Neurological Examination: Sensation grossly intact b/l with 10 gram monofilament. Vibratory sensation intact b/l. Protective sensation intact 5/5 intact bilaterally with 10g monofilament b/l. Vibratory sensation intact b/l. Proprioception intact bilaterally.  Dermatological Examination: Pedal skin with normal turgor, texture and tone b/l. Toenails 1-5 b/l thick, discolored, elongated with subungual debris and pain on dorsal palpation. No hyperkeratotic lesions noted b/l.  Healing wound medial hallux near MPJ measuring 0.5 x 1.0 cm with contracture of wound. No surrounding erythema, no edema, no drainage, no flucutance.    Musculoskeletal Examination: Muscle strength 5/5 to b/l LE. No pain, crepitus or joint limitation noted with ROM bilateral LE.  Radiographs: None  Last A1c:      Latest Ref Rng & Units 04/20/2021    9:30 AM  Hemoglobin A1C  Hemoglobin-A1c 0.0 - 7.0 % 6.1      Assessment/Plan: 1. Pain due to onychomycosis of toenails of both feet   2. Open wound of toe, initial encounter   3. Controlled type 2 diabetes mellitus with complication, with long-term current use of insulin ([place])     -Patient was evaluated and treated. All patient's and/or POA's questions/concerns answered on today's visit. -Ordered noninvasive arterial studies ABIs with and without TBIs for b/l lower extremities. -Left great toe cleansed with wound cleanser. Silvadene Cream and fabric band-aid applied. Rx sent for Silvadene Cream to be applied once daily until healed. -Toenails 1-5 b/l were debrided in length  and girth with sterile nail nippers and dremel without iatrogenic bleeding.  -Patient/POA to call should there be question/concern in the interim.   Return in about 3 months (around 02/21/2022).  Marzetta Board, DPM

## 2021-11-28 ENCOUNTER — Ambulatory Visit (INDEPENDENT_AMBULATORY_CARE_PROVIDER_SITE_OTHER): Payer: Medicaid Other | Admitting: Surgery

## 2021-11-28 ENCOUNTER — Telehealth: Payer: Self-pay

## 2021-11-28 ENCOUNTER — Encounter: Payer: Self-pay | Admitting: Surgery

## 2021-11-28 VITALS — BP 138/85 | HR 74 | Temp 98.3°F | Ht 70.0 in | Wt 348.0 lb

## 2021-11-28 DIAGNOSIS — D1779 Benign lipomatous neoplasm of other sites: Secondary | ICD-10-CM

## 2021-11-28 NOTE — Telephone Encounter (Signed)
Call to patient to notify that she needs to have lab work done prior to her follow up appointment in July. She may have this done at [facility], [place], or any other [facility] facility. She may call us with any questions.

## 2021-11-28 NOTE — Patient Instructions (Addendum)
Please call and schedule an appointment with Dr.Jeffrey Buddy Duty. Please see your follow up appointment listed below.   Medical Clearance faxed to [facility] .

## 2021-11-28 NOTE — Telephone Encounter (Signed)
Medical Clearance faxed to [facility] Endocrinologist.

## 2021-11-29 ENCOUNTER — Ambulatory Visit (HOSPITAL_COMMUNITY)
Admission: RE | Admit: 2021-11-29 | Discharge: 2021-11-29 | Disposition: A | Discharge status: Home / Self Care | Payer: Medicaid Other | Source: Ambulatory Visit | Attending: Podiatry | Admitting: Podiatry

## 2021-11-29 ENCOUNTER — Encounter: Payer: Self-pay | Admitting: Surgery

## 2021-11-29 DIAGNOSIS — S91109A Unspecified open wound of unspecified toe(s) without damage to nail, initial encounter: Secondary | ICD-10-CM

## 2021-11-29 DIAGNOSIS — E118 Type 2 diabetes mellitus with unspecified complications: Secondary | ICD-10-CM | POA: Insufficient documentation

## 2021-11-29 DIAGNOSIS — Z794 Long term (current) use of insulin: Secondary | ICD-10-CM

## 2021-11-29 NOTE — Progress Notes (Signed)
Patient ID: [name], female   DOB: [birth year], 56 y.o.   MRN: [id number]  HPI Yarieliz Wasser is a 56 y.o. female in consultation at the request of Dr. Wynetta Emery for a right adrenal mass.  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  Also compared to the images CT 2019 at that time was 4.8 It is difficult to establish wether or not the mass is symptomatic as she has chronic pain and multiple medical issues She does have chronic back pain currently walks with a walker. She does have some dyspnea on exertion and COPD.  She did have a CT history of PE in 2016.  She is currently on Eliquis. She is diabetic hemoglobin A1c is 5.6 BMP shows some hypokalemia with some chronic renal insufficiency and a baseline creatinine of 1.19, her CBC is completely normal She did have a history of cholecystectomy the past as well as thyroidectomy She sees Dr. 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 ([place]) AGE 38   HAD TO LEARN TO WALK AGAIN   Iron deficiency anemia due to chronic blood loss 09/09/2014   Low TSH level 09/08/2014   Lumbar herniated disc    Menorrhagia 09/08/2014   Migraine    CLUSTER AND MIGRAINES   Multiple thyroid nodules    Ovarian mass, right 09/09/2014   PE (pulmonary embolism) 2016   Peripheral neuropathy    FINGER TIPS AND  TOES NUMB SOME   Preeclampsia 1983   WITH PREGNANCY   PTSD (post-traumatic stress disorder)    Scoliosis    Seizures ([place]) AGE 38   NONE SINCE, HAD CHICKEN POX THEN    Past Surgical History:  Procedure Laterality Date   ANGIOGRAM TO LEG  08-13-15   RIGHT   CHOLECYSTECTOMY     IR GENERIC HISTORICAL  04/04/2016   IR RADIOLOGIST EVAL & MGMT 04/04/2016 [name], MD [facility] INTERV RAD   RADIOLOGY WITH ANESTHESIA N/A 07/17/2021   Procedure: MRI LUMBAR SPINE WITHOUT CONTRAST; MRI CERVICAL SPINE WITHOUT CONTRAST WITH ANESTHESIA;  Surgeon: Radiologist, Medication, MD;  Location: [facility];  Service: Radiology;  Laterality: N/A;   THYROIDECTOMY N/A 11/17/2015   Procedure: TOTAL THYROIDECTOMY;  Surgeon: Armandina Gemma, MD;  Location: [facility];  Service: General;  Laterality: N/A;   UTERINE ARTERY EMBOLIZATION Bilateral 08/13/2015    Family History  Problem Relation Age of Onset   Diabetes Mother    Breast cancer Mother    CAD Mother    Hypertension Mother    Alcohol abuse Father    Hypertension Father    Breast cancer Maternal Aunt    Breast cancer Maternal Aunt     Social History Social History   Tobacco Use   Smoking status:  Former    Packs/day: 0.50    Years: 40.00    Pack years: 20.00    Types: Cigarettes    Quit date: 11/03/2014    Years since quitting: 7.0    Passive exposure: Past   Smokeless tobacco: Never  Vaping Use   Vaping Use: Never used  Substance Use Topics   Alcohol use: No    Alcohol/week: 0.0 standard drinks   Drug use: No    Allergies  Allergen Reactions   Ceftriaxone Anaphylaxis    ROCEPHIN   Metformin Anaphylaxis    ALL   Penicillins Shortness Of Breath    Has patient had a PCN reaction causing immediate rash, facial/tongue/throat swelling, SOB or lightheadedness with hypotension: Yes Has patient had a PCN reaction causing severe rash involving mucus membranes or skin necrosis: No Has patient had a PCN reaction that required hospitalization No Has patient  had a PCN reaction occurring within the last 10 years: No If all of the above answers are "NO", then may proceed with Cephalosporin use.    Shellfish Allergy Anaphylaxis   Flonase [Fluticasone Propionate]     Makes migraines worse   Gold-Containing Drug Products     HANDS ITCH   Nickel     HANDS SWELL   Citrus Rash    Current Outpatient Medications  Medication Sig Dispense Refill   acetaminophen (TYLENOL) 500 MG tablet Take 1 tablet (500 mg total) by mouth every 6 (six) hours as needed. 30 tablet 0   albuterol (PROVENTIL) (2.5 MG/3ML) 0.083% nebulizer solution Take 3 mLs (2.5 mg total) by nebulization every 6 (six) hours as needed for wheezing or shortness of breath. 3 mL 3   albuterol (VENTOLIN HFA) 108 (90 Base) MCG/ACT inhaler Inhale 2 puffs into the lungs every 6 (six) hours as needed for wheezing or shortness of breath. 8 g 2   atorvastatin (LIPITOR) 20 MG tablet TAKE ONE TABLET BY MOUTH ONCE DAILY AT BEDTIME 30 tablet 2   budesonide-formoterol (SYMBICORT) 160-4.5 MCG/ACT inhaler Inhale 2 puffs into the lungs 2 (two) times daily. 2 each 6   buPROPion (WELLBUTRIN XL) 300 MG 24 hr tablet Take 300 mg by mouth daily.   2   busPIRone (BUSPAR) 10 MG tablet Take 10 mg by mouth 2 (two) times daily.     Cholecalciferol (VITAMIN D3) 10 MCG (400 UNIT) CAPS Take 400 Int'l Units/day by mouth daily. 100 capsule 2   citalopram (CELEXA) 40 MG tablet Take 40 mg by mouth daily.   2   cyclobenzaprine (FLEXERIL) 10 MG tablet TAKE 1 TABLET (10 MG TOTAL) BY MOUTH 3 (THREE) TIMES DAILY AS NEEDED FOR MUSCLE SPASMS. 30 tablet 1   doxepin (SINEQUAN) 50 MG capsule Take 150 mg by mouth at bedtime.     ELIQUIS 5 MG TABS tablet TAKE ONE TABLET BY MOUTH TWICE DAILY MORNING AND EVENING 60 tablet 2   Erenumab-aooe (AIMOVIG) 140 MG/ML SOAJ Inject 140 mg into the skin every 28 (twenty-eight) days. 1.12 mL 5   gabapentin (NEURONTIN) 300 MG capsule TAKE ONE CAPSULE BY MOUTH THREE TIMES DAILY (AM+NOON+BEDTIME) 90 capsule  6   hydrochlorothiazide (MICROZIDE) 12.5 MG capsule TAKE ONE CAPSULE BY MOUTH ONCE DAILY(AM) 30 capsule 2   metoprolol tartrate (LOPRESSOR) 50 MG tablet TAKE ONE TABLET BY MOUTH TWICE DAILY (AM+BEDTIME) 60 tablet 2   montelukast (SINGULAIR) 10 MG tablet TAKE 1 TABLET BY MOUTH EVERY EVENING AT BEDTIME 30 tablet 2   prazosin (MINIPRESS) 2 MG capsule Take 6 mg by  mouth at bedtime.     silver sulfADIAZINE (SILVADENE) 1 % cream Apply 1 application. topically daily. 50 g 0   sodium chloride (OCEAN) 0.65 % SOLN nasal spray Place 1 spray into both nostrils as needed for congestion. 30 mL 0   SYNTHROID 200 MCG tablet Take 1 tablet (200 mcg total) by mouth daily before breakfast. 30 tablet 4   topiramate (TOPAMAX) 200 MG tablet TAKE 1 TABLET (200 MG TOTAL) BY MOUTH AT BEDTIME. 90 tablet 1   triamcinolone cream (KENALOG) 0.1 % Apply 1 application. topically 2 (two) times daily. 30 g 1   TRULICITY 9.76 BH/4.1PF SOPN Inject 1.5 mg into the skin once a week. 4 mL 6   No current facility-administered medications for this visit.     Review of Systems Full ROS  was asked and was negative except for the information on the HPI  Physical Exam Blood pressure 138/85, pulse 74, temperature 98.3 F (36.8 C), height '5\' 10"'$  (1.778 m), weight (!) 348 lb (157.9 kg), SpO2 96 %. CONSTITUTIONAL: NAD BMI 49, uses a walker. EYES: Pupils are equal, round,  Sclera are non-icteric. EARS, NOSE, MOUTH AND THROAT: The oropharynx is clear. The oral mucosa is pink and moist. Hearing is intact to voice. LYMPH NODES:  Lymph nodes in the neck are normal. RESPIRATORY:  Lungs are clear. There is normal respiratory effort, with equal breath sounds bilaterally, and without pathologic use of accessory muscles. CARDIOVASCULAR: Heart is regular without murmurs, gallops, or rubs. GI: The abdomen is  soft, reducible umbilical incisional hernia. There are no palpable masses. There is no hepatosplenomegaly. There are normal bowel sounds in  all quadrants. GU: Rectal deferred.   MUSCULOSKELETAL: Normal muscle strength and tone. No cyanosis or edema.   SKIN: Turgor is good and there are no pathologic skin lesions or ulcers. NEUROLOGIC: Motor and sensation is grossly normal. Cranial nerves are grossly intact. PSYCH:  Oriented to person, place and time. Affect is normal.  Data Reviewed  I have personally reviewed the patient's imaging, laboratory findings and medical records.    Assessment/Plan  56 year old female with enlarging 5.8 cm myelo lipoma with prior hemorrhage ( currently anticoagulated for PE).  DisCussed with the patient in detail about her disease process and options, continue imaging follow up vs excision.  I do think that adrenalectomy would be indicated at some point in time given increase in the siize and prior hemorrhage.  I would  like to make sure there is not a functional component and an adrenal lesion specifically rule out pheochromocytoma.  She already has an endocrinologist and I will make sure to touch base with Dr. Buddy Duty since she is diabetic she may benefit from [place].  Do think that her weight can be optimized to decrease perioperative morbidity.  I had an extensive discussion about my thought process.  We will get in touch with Dr. Buddy Duty for further w/u and treatment. I spent > 60 minutes in this encounter including personally reviewing records, images, [placing orders, coordinating her case and performing appropriate documentation  Caroleen Hamman, MD FACS General Surgeon 11/29/2021, 1:55 PM

## 2021-11-30 ENCOUNTER — Other Ambulatory Visit: Payer: Self-pay | Admitting: Internal Medicine

## 2021-11-30 ENCOUNTER — Telehealth: Payer: Self-pay

## 2021-11-30 DIAGNOSIS — I10 Essential (primary) hypertension: Secondary | ICD-10-CM

## 2021-11-30 NOTE — Telephone Encounter (Signed)
-----   Message from Marzetta Board, DPM sent at 11/29/2021  1:50 PM EDT ----- Please call patient and inform her the circulation test came back normal.

## 2021-11-30 NOTE — Telephone Encounter (Signed)
Spoke to patient to notify her of Dr. Heber [place] comments. Patient verbalized understanding and all questions were answered.   Dr. Heber [place] comments: Please call patient and inform her the circulation test came back normal.

## 2021-12-01 ENCOUNTER — Other Ambulatory Visit: Payer: Self-pay | Admitting: Internal Medicine

## 2021-12-01 DIAGNOSIS — Z794 Long term (current) use of insulin: Secondary | ICD-10-CM

## 2021-12-01 DIAGNOSIS — Z86718 Personal history of other venous thrombosis and embolism: Secondary | ICD-10-CM

## 2021-12-01 DIAGNOSIS — E118 Type 2 diabetes mellitus with unspecified complications: Secondary | ICD-10-CM

## 2021-12-01 DIAGNOSIS — J4550 Severe persistent asthma, uncomplicated: Secondary | ICD-10-CM

## 2021-12-01 DIAGNOSIS — S46011D Strain of muscle(s) and tendon(s) of the rotator cuff of right shoulder, subsequent encounter: Secondary | ICD-10-CM

## 2021-12-01 DIAGNOSIS — I10 Essential (primary) hypertension: Secondary | ICD-10-CM

## 2021-12-03 NOTE — Telephone Encounter (Signed)
Requested Prescriptions  Pending Prescriptions Disp Refills  . atorvastatin (LIPITOR) 20 MG tablet [Pharmacy Med Name: ATORVASTATIN CALCIUM 20 MG ORAL TABLET] 30 tablet 2    Sig: TAKE ONE TABLET BY MOUTH ONCE DAILY AT BEDTIME     Cardiovascular:  Antilipid - Statins Failed - 12/01/2021 12:23 PM      Failed - Lipid Panel in normal range within the last 12 months    Cholesterol, Total  Date Value Ref Range Status  04/20/2020 130 100 - 199 mg/dL Final   LDL Chol Calc (NIH)  Date Value Ref Range Status  04/20/2020 68 0 - 99 mg/dL Final   HDL  Date Value Ref Range Status  04/20/2020 44 >39 mg/dL Final   Triglycerides  Date Value Ref Range Status  04/20/2020 95 0 - 149 mg/dL Final         Passed - Patient is not pregnant      Passed - Valid encounter within last 12 months    Recent Outpatient Visits          3 months ago Type 2 diabetes mellitus with morbid obesity ([place])   [place] [name], MD   6 months ago Bulging lumbar disc   [place], [place], [name]   7 months ago Type 2 diabetes mellitus with morbid obesity [facility])   [place], MD   9 months ago Poor sleep   [place], MD   11 months ago Type 2 diabetes mellitus with morbid obesity [facility])   [place], Deborah B, MD      Future Appointments            In 1 week [name], Daleen Bo, MD [place]   In 3 weeks [name], MD [place]           . ELIQUIS 5 MG TABS tablet [Pharmacy Med Name: ELIQUIS 5 MG ORAL TABLET] 60 tablet 2    Sig: TAKE ONE TABLET BY MOUTH TWICE DAILY MORNING AND EVENING     Hematology:  Anticoagulants - apixaban Failed - 12/01/2021 12:23 PM      Failed - Cr in normal range and within 360 days    Creat  Date Value Ref Range  Status  06/27/2016 0.72 0.50 - 1.10 mg/dL Final   Creatinine, Ser  Date Value Ref Range Status  07/17/2021 1.19 (H) 0.44 - 1.00 mg/dL Final   Creatinine, Urine  Date Value Ref Range Status  08/25/2015 57 20 - 320 mg/dL Final         Passed - PLT in normal range and within 360 days    Platelets  Date Value Ref Range Status  05/11/2021 237 150 - 400 K/uL Final  04/29/2019 289 150 - 450 x10E3/uL Final         Passed - HGB in normal range and within 360 days    Hemoglobin  Date Value Ref Range Status  05/11/2021 15.0 12.0 - 15.0 g/dL Final  08/25/2020 15.7 11.1 - 15.9 g/dL Final         Passed - HCT in normal range and within 360 days    HCT  Date Value Ref Range Status  05/11/2021 44.0 36.0 - 46.0 % Final   Hematocrit  Date Value Ref Range Status  08/25/2020  46.6 34.0 - 46.6 % Final         Passed - AST in normal range and within 360 days    AST  Date Value Ref Range Status  05/11/2021 19 15 - 41 U/L Final         Passed - ALT in normal range and within 360 days    ALT  Date Value Ref Range Status  05/11/2021 27 0 - 44 U/L Final         Passed - Valid encounter within last 12 months    Recent Outpatient Visits          3 months ago Type 2 diabetes mellitus with morbid obesity ([place])   [place] [name], MD   6 months ago Bulging lumbar disc   [place], [place], [name]   7 months ago Type 2 diabetes mellitus with morbid obesity [facility])   [place], MD   9 months ago Poor sleep   [place], MD   11 months ago Type 2 diabetes mellitus with morbid obesity [facility])   [place], MD      Future Appointments            In 1 week [name], Daleen Bo, MD [place]   In 3 weeks [name], MD [place]           . metoprolol tartrate (LOPRESSOR) 50 MG tablet [Pharmacy Med Name: METOPROLOL TARTRATE 50 MG ORAL TABLET] 60 tablet 2    Sig: TAKE ONE TABLET BY MOUTH TWICE DAILY (AM+BEDTIME)     Cardiovascular:  Beta Blockers Passed - 12/01/2021 12:23 PM      Passed - Last BP in normal range    BP Readings from Last 1 Encounters:  11/28/21 138/85         Passed - Last Heart Rate in normal range    Pulse Readings from Last 1 Encounters:  11/28/21 74         Passed - Valid encounter within last 6 months    Recent Outpatient Visits          3 months ago Type 2 diabetes mellitus with morbid obesity ([place])   [place] [name], MD   6 months ago Bulging lumbar disc   [place], [place], [name]   7 months ago Type 2 diabetes mellitus with morbid obesity [facility])   [place], MD   9 months ago Poor sleep   [place], MD   11 months ago Type 2 diabetes mellitus with morbid obesity [facility])   [place], Deborah B, MD      Future Appointments            In 1 week [name], Daleen Bo, MD [place]   In 3 weeks [name], MD [place]           . montelukast (SINGULAIR) 10 MG tablet [Pharmacy Med Name: MONTELUKAST SODIUM 10 MG ORAL TABLET] 30 tablet 2    Sig: TAKE 1 TABLET BY MOUTH EVERY EVENING AT BEDTIME  Pulmonology:  Leukotriene Inhibitors Passed - 12/01/2021 12:23 PM      Passed - Valid encounter within last 12 months    Recent Outpatient Visits          3 months ago Type 2 diabetes mellitus with morbid obesity ([place])   [place] [name], MD   6 months ago Bulging lumbar disc   [place], [place], [name]   7  months ago Type 2 diabetes mellitus with morbid obesity [facility])   [place], MD   9 months ago Poor sleep   [place], MD   11 months ago Type 2 diabetes mellitus with morbid obesity [facility])   [place], Deborah B, MD      Future Appointments            In 1 week [name], Daleen Bo, MD [place]   In 3 weeks [name], MD [place]           . cyclobenzaprine (FLEXERIL) 10 MG tablet [Pharmacy Med Name: CYCLOBENZAPRINE HCL 10 MG ORAL TABLET] 30 tablet 1    Sig: TAKE 1 TABLET (10 MG TOTAL) BY MOUTH 3 (THREE) TIMES DAILY AS NEEDED FOR MUSCLE SPASMS.     Not Delegated - Analgesics:  Muscle Relaxants Failed - 12/01/2021 12:23 PM      Failed - This refill cannot be delegated      Passed - Valid encounter within last 6 months    Recent Outpatient Visits          3 months ago Type 2 diabetes mellitus with morbid obesity ([place])   [place] [name], MD   6 months ago Bulging lumbar disc   [place], [place], [name]   7 months ago Type 2 diabetes mellitus with morbid obesity [facility])   [place], MD   9 months ago Poor sleep   [place], MD   11 months ago Type 2 diabetes mellitus with morbid obesity [facility])   [place], Deborah B, MD      Future Appointments            In 1 week [name], Daleen Bo, MD [place]   In 3 weeks [name], MD [place]

## 2021-12-03 NOTE — Telephone Encounter (Signed)
Requested medication (s) are due for refill today: yes  Requested medication (s) are on the active medication list: yes  Last refill:  09/28/21  Future visit scheduled: yes  Notes to clinic:  Unable to refill per protocol due to failed labs, no updated results for atorvastatin. Flexeril is not delegated.      Requested Prescriptions  Pending Prescriptions Disp Refills   atorvastatin (LIPITOR) 20 MG tablet [Pharmacy Med Name: ATORVASTATIN CALCIUM 20 MG ORAL TABLET] 30 tablet 2    Sig: TAKE ONE TABLET BY MOUTH ONCE DAILY AT BEDTIME     Cardiovascular:  Antilipid - Statins Failed - 12/01/2021 12:23 PM      Failed - Lipid Panel in normal range within the last 12 months    Cholesterol, Total  Date Value Ref Range Status  04/20/2020 130 100 - 199 mg/dL Final   LDL Chol Calc (NIH)  Date Value Ref Range Status  04/20/2020 68 0 - 99 mg/dL Final   HDL  Date Value Ref Range Status  04/20/2020 44 >39 mg/dL Final   Triglycerides  Date Value Ref Range Status  04/20/2020 95 0 - 149 mg/dL Final         Passed - Patient is not pregnant      Passed - Valid encounter within last 12 months    Recent Outpatient Visits           3 months ago Type 2 diabetes mellitus with morbid obesity ([place])   [place] [name], MD   6 months ago Bulging lumbar disc   [employer] [place], [place], [name]   7 months ago Type 2 diabetes mellitus with morbid obesity [facility])   [place] [name], MD   9 months ago Poor sleep   [place], MD   11 months ago Type 2 diabetes mellitus with morbid obesity [facility])   [place], Deborah B, MD       Future Appointments             In 1 week [name] Daleen Bo, MD [place]   In 3 weeks [name], MD [place]               cyclobenzaprine (FLEXERIL) 10 MG tablet [Pharmacy Med Name: CYCLOBENZAPRINE HCL 10 MG ORAL TABLET] 30 tablet 1    Sig: TAKE 1 TABLET (10 MG TOTAL) BY MOUTH 3 (THREE) TIMES DAILY AS NEEDED FOR MUSCLE SPASMS.     Not Delegated - Analgesics:  Muscle Relaxants Failed - 12/01/2021 12:23 PM      Failed - This refill cannot be delegated      Passed - Valid encounter within last 6 months    Recent Outpatient Visits           3 months ago Type 2 diabetes mellitus with morbid obesity ([place])   [place] [name], MD   6 months ago Bulging lumbar disc   [place], [place], [name]   7 months ago Type 2 diabetes mellitus with morbid obesity [facility])   [place] [name], MD   9 months ago Poor sleep   [place] [name], MD   11 months  ago Type 2 diabetes mellitus with morbid obesity ([place])   [place], MD       Future Appointments             In 1 week [name], Daleen Bo, MD [place]   In 3 weeks [name], MD [place]             Signed Prescriptions Disp Refills   ELIQUIS 5 MG TABS tablet 60 tablet 2    Sig: TAKE ONE TABLET BY MOUTH TWICE DAILY MORNING AND EVENING     Hematology:  Anticoagulants - apixaban Failed - 12/01/2021 12:23 PM      Failed - Cr in normal range and within 360 days    Creat  Date Value Ref Range Status  06/27/2016 0.72 0.50 - 1.10 mg/dL Final   Creatinine, Ser  Date Value Ref Range Status  07/17/2021 1.19 (H) 0.44 - 1.00 mg/dL Final   Creatinine, Urine  Date Value Ref Range Status  08/25/2015 57 20 - 320 mg/dL Final         Passed - PLT in normal range and within 360 days    Platelets  Date Value Ref Range Status  05/11/2021 237 150 - 400 K/uL Final  04/29/2019 289  150 - 450 x10E3/uL Final         Passed - HGB in normal range and within 360 days    Hemoglobin  Date Value Ref Range Status  05/11/2021 15.0 12.0 - 15.0 g/dL Final  08/25/2020 15.7 11.1 - 15.9 g/dL Final         Passed - HCT in normal range and within 360 days    HCT  Date Value Ref Range Status  05/11/2021 44.0 36.0 - 46.0 % Final   Hematocrit  Date Value Ref Range Status  08/25/2020 46.6 34.0 - 46.6 % Final         Passed - AST in normal range and within 360 days    AST  Date Value Ref Range Status  05/11/2021 19 15 - 41 U/L Final         Passed - ALT in normal range and within 360 days    ALT  Date Value Ref Range Status  05/11/2021 27 0 - 44 U/L Final         Passed - Valid encounter within last 12 months    Recent Outpatient Visits           3 months ago Type 2 diabetes mellitus with morbid obesity ([place])   [place] [name], MD   6 months ago Bulging lumbar disc   [place], [place], [name]   7 months ago Type 2 diabetes mellitus with morbid obesity [facility])   [place] [name], MD   9 months ago Poor sleep   [place], MD   11 months ago Type 2 diabetes mellitus with morbid obesity [facility])   [place], Deborah B, MD       Future Appointments             In 1 week [name], Daleen Bo, MD [place]   In 3 weeks [name], MD [place]  metoprolol tartrate (LOPRESSOR) 50 MG tablet 60 tablet 2    Sig: TAKE ONE TABLET BY MOUTH TWICE DAILY (AM+BEDTIME)     Cardiovascular:  Beta Blockers Passed - 12/01/2021 12:23 PM      Passed - Last BP in normal range    BP Readings from Last 1 Encounters:  11/28/21 138/85         Passed - Last Heart Rate in normal range    Pulse Readings  from Last 1 Encounters:  11/28/21 74         Passed - Valid encounter within last 6 months    Recent Outpatient Visits           3 months ago Type 2 diabetes mellitus with morbid obesity ([place])   [place] [name], MD   6 months ago Bulging lumbar disc   [place], [place], [name]   7 months ago Type 2 diabetes mellitus with morbid obesity ([place])   [place] [name], MD   9 months ago Poor sleep   [name] B, MD   11 months ago Type 2 diabetes mellitus with morbid obesity [facility])   [place], Deborah B, MD       Future Appointments             In 1 week [name], Daleen Bo, MD [place]   In 3 weeks [name], MD [place]              montelukast (SINGULAIR) 10 MG tablet 30 tablet 2    Sig: TAKE 1 TABLET BY MOUTH EVERY EVENING AT BEDTIME     Pulmonology:  Leukotriene Inhibitors Passed - 12/01/2021 12:23 PM      Passed - Valid encounter within last 12 months    Recent Outpatient Visits           3 months ago Type 2 diabetes mellitus with morbid obesity ([place])   [place] [name], MD   6 months ago Bulging lumbar disc   [place], [place], [name]   7 months ago Type 2 diabetes mellitus with morbid obesity [facility])   [place] [name], MD   9 months ago Poor sleep   [place], MD   11 months ago Type 2 diabetes mellitus with morbid obesity [facility])   [place], Deborah B, MD       Future Appointments             In 1 week [name], Daleen Bo, MD [place]   In 3 weeks [name], MD [place]

## 2021-12-07 ENCOUNTER — Other Ambulatory Visit: Payer: Self-pay | Admitting: Obstetrics and Gynecology

## 2021-12-07 DIAGNOSIS — F411 Generalized anxiety disorder: Secondary | ICD-10-CM | POA: Diagnosis not present

## 2021-12-07 DIAGNOSIS — F4310 Post-traumatic stress disorder, unspecified: Secondary | ICD-10-CM | POA: Diagnosis not present

## 2021-12-07 DIAGNOSIS — Z85850 Personal history of malignant neoplasm of thyroid: Secondary | ICD-10-CM | POA: Diagnosis not present

## 2021-12-07 DIAGNOSIS — E119 Type 2 diabetes mellitus without complications: Secondary | ICD-10-CM | POA: Diagnosis not present

## 2021-12-07 DIAGNOSIS — E278 Other specified disorders of adrenal gland: Secondary | ICD-10-CM | POA: Diagnosis not present

## 2021-12-07 DIAGNOSIS — E890 Postprocedural hypothyroidism: Secondary | ICD-10-CM | POA: Diagnosis not present

## 2021-12-07 DIAGNOSIS — F331 Major depressive disorder, recurrent, moderate: Secondary | ICD-10-CM | POA: Diagnosis not present

## 2021-12-07 NOTE — Patient Outreach (Signed)
Medicaid Managed Care   Nurse Care Manager Note  12/07/2021 Name:  Melinda [name] MRN:  [id number] DOB:  [name] 31, 1967  Melinda [name] is an 56 y.o. year old female who is a primary patient of Melinda Pier, MD.  The [name] Managed Care Coordination team was consulted for assistance with:    Chronic healthcare management needs, DM, asthma, HTN, migraines, anxiety/depression, chronic pain, DDD, SS  Melinda [name] was given information about Medicaid Managed Care Coordination team services today. Melinda [name] Patient agreed to services and verbal consent obtained.  Engaged with patient by telephone for follow up visit in response to provider referral for case management and/or care coordination services.   Assessments/Interventions:  Review of past medical history, allergies, medications, health status, including review of consultants reports, laboratory and other test data, was performed as part of comprehensive evaluation and provision of chronic care management services.  SDOH (Social Determinants of Health) assessments and interventions performed: SDOH Interventions    Flowsheet Row Most Recent Value  SDOH Interventions   Housing Interventions Intervention Not Indicated  Stress Interventions --  [sees Psychiatrist-medications adjusted]       Care Plan  Allergies  Allergen Reactions   Ceftriaxone Anaphylaxis    ROCEPHIN   Metformin Anaphylaxis    ALL   Penicillins Shortness Of Breath    Has patient had a PCN reaction causing immediate rash, facial/tongue/throat swelling, SOB or lightheadedness with hypotension: Yes Has patient had a PCN reaction causing severe rash involving mucus membranes or skin necrosis: No Has patient had a PCN reaction that required hospitalization No Has patient had a PCN reaction occurring within the last 10 years: No If all of the above answers are "NO", then [name] proceed with Cephalosporin use.    Shellfish Allergy Anaphylaxis   Flonase [Fluticasone  Propionate]     Makes migraines worse   Gold-Containing Drug Products     HANDS ITCH   Nickel     HANDS SWELL   Citrus Rash    Medications Reviewed Today     Reviewed by Melinda Medicus, RN (Registered Nurse) on 12/07/21 at 1521  Med List Status: <None>   Medication Order Taking? Sig Documenting Provider Last Dose Status Informant  acetaminophen (TYLENOL) 500 MG tablet [id number]  Take 1 tablet (500 mg total) by mouth every 6 (six) hours as needed. Melinda Oto, MD  Active   albuterol (PROVENTIL) (2.5 MG/3ML) 0.083% nebulizer solution [id number]  Take 3 mLs (2.5 mg total) by nebulization every 6 (six) hours as needed for wheezing or shortness of breath. Melinda Pier, MD  Active Self  albuterol (VENTOLIN HFA) 108 (90 Base) MCG/ACT inhaler [id number]  Inhale 2 puffs into the lungs every 6 (six) hours as needed for wheezing or shortness of breath. Melinda Pier, MD  Active Self  atorvastatin (LIPITOR) 20 MG tablet [id number]  TAKE ONE TABLET BY MOUTH ONCE DAILY AT BEDTIME Melinda Pier, MD  Active   budesonide-formoterol [facility]) 160-4.5 MCG/ACT inhaler [id number]  Inhale 2 puffs into the lungs 2 (two) times daily. Melinda Pier, MD  Active   buPROPion (WELLBUTRIN XL) 300 MG 24 hr tablet [id number]  Take 300 mg by mouth daily.  [provider]  Active Self  busPIRone (BUSPAR) 10 MG tablet [id number]  Take 10 mg by mouth 2 (two) times daily. [provider]  Active Self           Med Note Melinda [name], Melinda [name] I   Wed Feb 18, 2018 10:03  PM)    Cholecalciferol (VITAMIN D3) 10 MCG (400 UNIT) CAPS [id number]  Take 400 Int'l Units/day by mouth daily. Melinda Pier, MD  Active   citalopram (CELEXA) 40 MG tablet [id number] No Take 40 mg by mouth daily.   Patient not taking: Reported on 12/07/2021   [provider] Not Taking Active Self  cyclobenzaprine (FLEXERIL) 10 MG tablet [id number]  TAKE 1 TABLET (10 MG TOTAL) BY MOUTH 3 (THREE) TIMES DAILY AS  NEEDED FOR MUSCLE SPASMS. Melinda Pier, MD  Active   doxepin (SINEQUAN) 50 MG capsule [id number]  Take 150 mg by mouth at bedtime. [provider]  Active Self  ELIQUIS 5 MG TABS tablet [id number]  TAKE ONE TABLET BY MOUTH TWICE DAILY MORNING AND EVENING Melinda Pier, MD  Active   Erenumab-aooe (AIMOVIG) 140 MG/ML Darden Palmer [id number]  Inject 140 mg into the skin every 28 (twenty-eight) days. Melinda Partridge, DO  Active Self  gabapentin (NEURONTIN) 300 MG capsule [id number]  TAKE ONE CAPSULE BY MOUTH THREE TIMES DAILY (AM+NOON+BEDTIME) Melinda Pier, MD  Active   hydrochlorothiazide (MICROZIDE) 12.5 MG capsule [id number]  TAKE ONE CAPSULE BY MOUTH ONCE DAILY(AM) Melinda Pier, MD  Active   metoprolol tartrate (LOPRESSOR) 50 MG tablet [id number]  TAKE ONE TABLET BY MOUTH TWICE DAILY (AM+BEDTIME) Melinda Pier, MD  Active   montelukast (SINGULAIR) 10 MG tablet [id number]  TAKE 1 TABLET BY MOUTH EVERY EVENING AT BEDTIME Melinda Pier, MD  Active   prazosin (MINIPRESS) 2 MG capsule [id number]  Take 6 mg by mouth at bedtime. [provider]  Active Self  silver sulfADIAZINE (SILVADENE) 1 % cream [id number]  Apply 1 application. topically daily. Melinda [name], DPM  Active   sodium chloride (OCEAN) 0.65 % SOLN nasal spray [id number]  Place 1 spray into both nostrils as needed for congestion. Melinda Pier, MD  Active Self  SYNTHROID 200 MCG tablet [id number]  Take 1 tablet (200 mcg total) by mouth daily before breakfast. Melinda Pier, MD  Active   topiramate (TOPAMAX) 200 MG tablet [id number]  TAKE 1 TABLET (200 MG TOTAL) BY MOUTH AT BEDTIME. Melinda Partridge, DO  Active Self  triamcinolone cream (KENALOG) 0.1 % [id number]  Apply 1 application. topically 2 (two) times daily. Melinda Pier, MD  Active   TRULICITY 0.35 WS/5.6CL Melinda [name] [id number]  Inject 1.5 mg into the skin once a week. Melinda Pier, MD  Active             Patient Active Problem List    Diagnosis Date Noted   Spinal stenosis excluding cervical region 11/21/2021   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   Abdominal pannus 08/21/2021   Aortic atherosclerosis ([place]) 08/21/2021   At high risk for falls 04/20/2020   Hyperlipidemia 02/16/2020   Adrenal mass, right ([place]) 04/29/2019   Chronic cough 03/12/2018   Left shoulder pain 10/14/2016   Hypertension 10/14/2016   S/P thyroidectomy 06/27/2016   Chronic bilateral low back pain without sciatica 04/22/2016   Liver lesion 12/24/2015   Controlled type 2 diabetes mellitus with complication, with long-term current use of insulin ([place]) 10/10/2015   Vision loss 08/25/2015   Decreased vision in both eyes 08/25/2015   Prolonged Q-T interval on ECG 08/19/2015   History of pulmonary embolism 08/10/2015   Mild persistent asthma 04/13/2015   Fibroid, uterine    Morbid obesity ([place]) 11/11/2014   Atypical chest pain 11/08/2014   Sinus tachycardia 11/08/2014  Migraine 10/14/2014   Menorrhagia 09/08/2014   Conditions to be addressed/monitored per PCP order:  Chronic healthcare management needs, DM, asthma, HTN, migraines, anxiety/depression, chronic pain, DDD, SS, vision loss  Care Plan : General Plan of Care (Adult)  Updates made by Melinda Medicus, RN since 12/07/2021 12:00 AM     Problem: Health Promotion or Disease Self-Management (General Plan of Care)   Priority: Medium  Onset Date: 10/20/2020     Long-Range Goal: Self-Management Plan Developed   Start Date: 07/14/2020  Expected End Date: 03/09/2022  Recent Progress: On track  Priority: Medium  Note:    Current Barriers:  Chronic Disease Management support and education needs Patient with anxiety and depression-has appointment with Psychiatrist  scheduled every 3 months, therapist every 2 weeks 12/07/21:  Patient to begin aquatic therapy-will need surgery on adrenal gland-starting Ozempic for weight reduction for surgery.  Psychiatrist adjusted meds as patient with  increased anxiety/stress. Nurse Case Manager Clinical Goal(s):  Over the next 90 days, patient will attend all scheduled medical appointments:  Over the next 30 days, patient will work with CM team pharmacist to review medications. Interventions:  Inter-disciplinary care team collaboration (see longitudinal plan of care) Evaluation of current treatment plan  and patient's adherence to plan as established by provider. Reviewed medications with patient. Collaborated with pharmacy regarding medications. Discussed plans with patient for ongoing care management follow up and provided patient with direct contact information for care management team Reviewed scheduled/upcoming provider appointments. Collaborated with SW for psychotherapy referral. SW referral for psychotherapy provider-completed Pharmacy referral for medication review. Self Care Activities: Over the next 90 days, patient will:  -Self administers medications as prescribed Attends all scheduled provider appointments Calls pharmacy for medication refills Calls provider office for new concerns or questions  Follow Up Plan: The Managed Medicaid care management team will reach out to the patient again over the next 30 days.  The patient has been provided with contact information for the Managed Medicaid care management team and has been advised to call with any health related questions or concerns.    Follow Up:  Patient agrees to Care Plan and Follow-up.  Plan: The Managed Medicaid care management team will reach out to the patient again over the next 30 business  days. and The  Patient has been provided with contact information for the Managed Medicaid care management team and has been advised to call with any health related questions or concerns.  Date/time of next scheduled RN care management/care coordination outreach:  01/15/22 at 0900

## 2021-12-07 NOTE — Patient Instructions (Signed)
Hi Melinda [name] for speaking with me, I hope you feel better.  Melinda [name] was given information about Medicaid Managed Care team care coordination services as a part of their [place] Medicaid benefit. Melinda [name] verbally consented to engagement with the [facility] Managed Care team.   If you are experiencing a medical emergency, please call 911 or report to your local emergency department or urgent care.   If you have a non-emergency medical problem during routine business hours, please contact your provider's office and ask to speak with a nurse.   For questions related to your [facility], please call: [phone] or visit the homepage here: [email or web address]  If you would like to schedule transportation through your [facility], please call the following number at least 2 days in advance of your appointment: [phone].  Rides for urgent appointments can also be made after hours by calling Member Services.  Call the [place] at [phone], at any time, 24 hours a day, 7 days a week. If you are in danger or need immediate medical attention call 911.  If you would like help to quit smoking, call 1-800-QUIT-NOW 618-775-7169) OR Espaol: 1-855-Djelo-Ya (7-619-509-3267) o para ms informacin haga clic aqu or Text READY to 200-400 to register via text  Melinda [name] - following are the goals we discussed in your visit today:   Goals Addressed             This Visit's Progress    Protect My Health         Timeframe:  Long-Range Goal Priority:  Medium Start Date:    07/14/20                         Expected End Date:  ongoing          Follow Up Date: 01/15/22 - schedule appointment for vaccines needed due to my age or health - schedule recommended health tests (blood work, mammogram, colonoscopy, pap test) - schedule and keep  appointment for annual check-up   12/07/21:  Patient to start aquatic therapy,    Patient verbalizes understanding of instructions and care plan provided today and agrees to view in [place]. Active MyChart status and patient understanding of how to access instructions and care plan via MyChart confirmed with patient.     The Managed Medicaid care management team will reach out to the patient again over the next 30 business  days.  The  Patient  has been provided with contact information for the Managed Medicaid care management team and has been advised to call with any health related questions or concerns.   Aida Raider RN, BSN [place] Management Coordinator - Managed Medicaid High Risk [phone]   Following is a copy of your plan of care:  Care Plan : General Plan of Care (Adult)  Updates made by Melinda Medicus, RN since 12/07/2021 12:00 AM     Problem: Health Promotion or Disease Self-Management (General Plan of Care)   Priority: Medium  Onset Date: 10/20/2020     Long-Range Goal: Self-Management Plan Developed   Start Date: 07/14/2020  Expected End Date: 03/09/2022  Recent Progress: On track  Priority: Medium  Note:    Current Barriers:  Chronic Disease Management support and education needs Patient with anxiety and depression-has appointment with Psychiatrist  scheduled every 3 months, therapist every 2 weeks 12/07/21:  Patient  to begin aquatic therapy-will need surgery on adrenal gland-starting Ozempic for weight reduction for surgery.  Psychiatrist adjusted meds as patient with increased anxiety/stress. Nurse Case Manager Clinical Goal(s):  Over the next 90 days, patient will attend all scheduled medical appointments:  Over the next 30 days, patient will work with CM team pharmacist to review medications. Interventions:  Inter-disciplinary care team collaboration (see longitudinal plan of care) Evaluation of current treatment plan  and  patient's adherence to plan as established by provider. Reviewed medications with patient. Collaborated with pharmacy regarding medications. Discussed plans with patient for ongoing care management follow up and provided patient with direct contact information for care management team Reviewed scheduled/upcoming provider appointments. Collaborated with SW for psychotherapy referral. SW referral for psychotherapy provider-completed Pharmacy referral for medication review. Self Care Activities: Over the next 90 days, patient will:  -Self administers medications as prescribed Attends all scheduled provider appointments Calls pharmacy for medication refills Calls provider office for new concerns or questions  Follow Up Plan: The Managed Medicaid care management team will reach out to the patient again over the next 30 days.  The patient has been provided with contact information for the Managed Medicaid care management team and has been advised to call with any health related questions or concerns.

## 2021-12-10 ENCOUNTER — Encounter: Payer: Self-pay | Admitting: Specialist

## 2021-12-10 ENCOUNTER — Ambulatory Visit (INDEPENDENT_AMBULATORY_CARE_PROVIDER_SITE_OTHER): Payer: Medicaid Other | Admitting: Specialist

## 2021-12-10 ENCOUNTER — Ambulatory Visit (INDEPENDENT_AMBULATORY_CARE_PROVIDER_SITE_OTHER): Payer: Medicaid Other

## 2021-12-10 VITALS — BP 117/74 | HR 84 | Ht 70.0 in | Wt 348.0 lb

## 2021-12-10 DIAGNOSIS — F4310 Post-traumatic stress disorder, unspecified: Secondary | ICD-10-CM | POA: Diagnosis not present

## 2021-12-10 DIAGNOSIS — M5416 Radiculopathy, lumbar region: Secondary | ICD-10-CM | POA: Diagnosis not present

## 2021-12-10 DIAGNOSIS — M4726 Other spondylosis with radiculopathy, lumbar region: Secondary | ICD-10-CM | POA: Diagnosis not present

## 2021-12-10 DIAGNOSIS — M47812 Spondylosis without myelopathy or radiculopathy, cervical region: Secondary | ICD-10-CM

## 2021-12-10 DIAGNOSIS — Z6841 Body Mass Index (BMI) 40.0 and over, adult: Secondary | ICD-10-CM

## 2021-12-10 DIAGNOSIS — M79675 Pain in left toe(s): Secondary | ICD-10-CM | POA: Diagnosis not present

## 2021-12-10 DIAGNOSIS — M5136 Other intervertebral disc degeneration, lumbar region: Secondary | ICD-10-CM | POA: Diagnosis not present

## 2021-12-10 NOTE — Patient Instructions (Addendum)
Avoid bending, stooping and avoid lifting weights greater than 10 lbs. Avoid prolong standing and walking. Avoid frequent bending and stooping  No lifting greater than 10 lbs. May use ice or moist heat for pain. Weight loss is of benefit. Handicap license is approved. Continue a pool exercise program.

## 2021-12-10 NOTE — Progress Notes (Signed)
Office Visit Note   Patient: Melinda [name]           Date of Birth: 29-Dec-1965           MRN: [id number] Visit Date: 12/10/2021              Requested by: [name], MD [address] [place],  [place] [address] PCP: [name], MD   Assessment & Plan: Visit Diagnoses:  1. Pain of left great toe   2. Other spondylosis with radiculopathy, lumbar region   3. Lumbar radiculopathy   4. Degenerative disc disease, lumbar   5. Body mass index 50.0-59.9, adult (HCC)   6. Spondylosis of cervical spine     Plan: Avoid bending, stooping and avoid lifting weights greater than 10 lbs. Avoid prolong standing and walking. Avoid frequent bending and stooping  No lifting greater than 10 lbs. May use ice or moist heat for pain. Weight loss is of benefit. Handicap license is approved. Continue a pool exercise program.   Follow-Up Instructions: No follow-ups on file.   Orders:  Orders Placed This Encounter  Procedures   XR Toe Great Left   No orders of the defined types were placed in this encounter.     Procedures: No procedures performed   Clinical Data: No additional findings.   Subjective: Chief Complaint  Patient presents with   Neck - Follow-up   Lower Back - Follow-up   Left Great Toe - Follow-up    56 year old female with history of lumbar pain and radiation into the right leg and foot. Pain worse with standing and walking. She has lost 14 lbs and this is good. Her left foot is being treated with silvadiene and the wound there is nearly healed. Xray done at her last visit showed an area of bowing of the proximal phalanx suspicious for bone injury with bowing but no displacment. Complaint of back and right leg and foot pain with prolong standing and walkingl  Review of Systems  Constitutional: Negative.   HENT: Negative.    Eyes: Negative.   Respiratory: Negative.    Cardiovascular: Negative.   Gastrointestinal: Negative.   Endocrine:  Negative.   Genitourinary: Negative.   Musculoskeletal: Negative.   Skin: Negative.   Allergic/Immunologic: Negative.   Neurological: Negative.   Hematological: Negative.   Psychiatric/Behavioral: Negative.      Objective: Vital Signs: BP 117/74 (BP Location: Left Arm, Patient Position: Sitting)   Pulse 84   Ht '5\' 10"'$  (1.778 m)   Wt (!) 348 lb (157.9 kg)   BMI 49.93 kg/m   Physical Exam Constitutional:      Appearance: She is well-developed.  HENT:     Head: Normocephalic and atraumatic.  Eyes:     Pupils: Pupils are equal, round, and reactive to light.  Pulmonary:     Effort: Pulmonary effort is normal.     Breath sounds: Normal breath sounds.  Abdominal:     General: Bowel sounds are normal.     Palpations: Abdomen is soft.  Musculoskeletal:        General: Normal range of motion.     Cervical back: Normal range of motion and neck supple.  Skin:    General: Skin is warm and dry.  Neurological:     Mental Status: She is alert and oriented to person, place, and time.  Psychiatric:        Behavior: Behavior normal.  Thought Content: Thought content normal.        Judgment: Judgment normal.   Left Ankle Exam  Left ankle exam is normal. Swelling: none  Comments:  Left medial great toe proximal phalanx with area of minimal discoloration not ischemic, ABIs for the legs 1.0 . Lef great toe    Specialty Comments:  No specialty comments available.  Imaging: No results found.   PMFS History: Patient Active Problem List   Diagnosis Date Noted   Spinal stenosis excluding cervical region 11/21/2021   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   Abdominal pannus 08/21/2021   Aortic atherosclerosis ([place]) 08/21/2021   At high risk for falls 04/20/2020   Hyperlipidemia 02/16/2020   Adrenal mass, right ([place]) 04/29/2019   Chronic cough 03/12/2018   Left shoulder pain 10/14/2016   Hypertension 10/14/2016   S/P thyroidectomy 06/27/2016   Chronic bilateral  low back pain without sciatica 04/22/2016   Liver lesion 12/24/2015   Controlled type 2 diabetes mellitus with complication, with long-term current use of insulin ([place]) 10/10/2015   Vision loss 08/25/2015   Decreased vision in both eyes 08/25/2015   Prolonged Q-T interval on ECG 08/19/2015   History of pulmonary embolism 08/10/2015   Mild persistent asthma 04/13/2015   Fibroid, uterine    Morbid obesity ([place]) 11/11/2014   Atypical chest pain 11/08/2014   Sinus tachycardia 11/08/2014   Migraine 10/14/2014   Menorrhagia 09/08/2014   Past Medical History:  Diagnosis Date   ADHD (attention deficit hyperactivity disorder)    Arthritis    Asthma    Cancer ([place])    Thyroid   Chicken pox AGE 89   COPD (chronic obstructive pulmonary disease) ([place])    pt reported   Diabetes mellitus without complication (HCC)    Diverticulosis, sigmoid    GERD (gastroesophageal reflux disease)    Glaucoma    BOTH EYES, NO EYE DROPS   Glaucoma    History of blood transfusion 08/2014    2UNITS GIVEN AND IRON GIVEN   Hyperglycemia 09/09/2014   Hypertension    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 ([place]) AGE 56   HAD TO LEARN TO WALK AGAIN   Iron deficiency anemia due to chronic blood loss 09/09/2014   Low TSH level 09/08/2014   Lumbar herniated disc    Menorrhagia 09/08/2014   Migraine    CLUSTER AND MIGRAINES   Multiple thyroid nodules    Ovarian mass, right 09/09/2014   PE (pulmonary embolism) 2016   Peripheral neuropathy    FINGER TIPS AND TOES NUMB SOME   Preeclampsia 1983   WITH PREGNANCY   PTSD (post-traumatic stress disorder)    Scoliosis    Seizures (HCC) AGE 29   NONE SINCE, HAD CHICKEN POX THEN    Family History  Problem Relation Age of Onset   Diabetes Mother    Breast cancer Mother    CAD Mother    Hypertension Mother    Alcohol abuse Father    Hypertension Father    Breast cancer Maternal Aunt    Breast cancer Maternal Aunt     Past Surgical  History:  Procedure Laterality Date   ANGIOGRAM TO LEG  08-13-15   RIGHT   CHOLECYSTECTOMY     IR GENERIC HISTORICAL  04/04/2016   IR RADIOLOGIST EVAL & MGMT 04/04/2016 [name], MD [facility] INTERV RAD   RADIOLOGY WITH ANESTHESIA N/A 07/17/2021   Procedure: MRI LUMBAR SPINE WITHOUT CONTRAST; MRI CERVICAL SPINE WITHOUT CONTRAST  WITH ANESTHESIA;  Surgeon: Radiologist, Medication, MD;  Location: [facility];  Service: Radiology;  Laterality: N/A;   THYROIDECTOMY N/A 11/17/2015   Procedure: TOTAL THYROIDECTOMY;  Surgeon: Armandina Gemma, MD;  Location: [facility];  Service: General;  Laterality: N/A;   UTERINE ARTERY EMBOLIZATION Bilateral 08/13/2015   Social History   Occupational History   Occupation: disabled  Tobacco Use   Smoking status: Former    Packs/day: 0.50    Years: 40.00    Total pack years: 20.00    Types: Cigarettes    Quit date: 11/03/2014    Years since quitting: 7.1    Passive exposure: Past   Smokeless tobacco: Never  Vaping Use   Vaping Use: Never used  Substance and Sexual Activity   Alcohol use: No    Alcohol/week: 0.0 standard drinks of alcohol   Drug use: No   Sexual activity: Never    Birth control/protection: None

## 2021-12-14 ENCOUNTER — Ambulatory Visit: Payer: Medicaid Other | Attending: Specialist

## 2021-12-14 DIAGNOSIS — G8929 Other chronic pain: Secondary | ICD-10-CM | POA: Insufficient documentation

## 2021-12-14 DIAGNOSIS — M25511 Pain in right shoulder: Secondary | ICD-10-CM | POA: Insufficient documentation

## 2021-12-14 DIAGNOSIS — R2681 Unsteadiness on feet: Secondary | ICD-10-CM | POA: Diagnosis present

## 2021-12-14 DIAGNOSIS — R262 Difficulty in walking, not elsewhere classified: Secondary | ICD-10-CM | POA: Diagnosis present

## 2021-12-14 DIAGNOSIS — M5412 Radiculopathy, cervical region: Secondary | ICD-10-CM | POA: Insufficient documentation

## 2021-12-14 DIAGNOSIS — R2689 Other abnormalities of gait and mobility: Secondary | ICD-10-CM | POA: Diagnosis present

## 2021-12-14 DIAGNOSIS — M6281 Muscle weakness (generalized): Secondary | ICD-10-CM | POA: Diagnosis present

## 2021-12-14 NOTE — Therapy (Signed)
OUTPATIENT PHYSICAL THERAPY TREATMENT NOTE   Patient Name: Melinda [name] MRN: [id number] DOB:Nov 16, 1965, 56 y.o., female Today's Date: 12/14/2021  PCP: [name], MD REFERRING PROVIDER: [name], MD   PT End of Session - 12/14/21 1250     Visit Number 15    Number of Visits 22    Date for PT Re-Evaluation 01/14/22    Authorization Type [employer] MCD    Authorization - Visit Number 11    Authorization - Number of Visits 32    PT Start Time 2202    PT Stop Time 1338    PT Time Calculation (min) 45 min    Activity Tolerance Patient tolerated treatment well;Patient limited by fatigue;Patient limited by pain    Behavior During Therapy WFL for tasks assessed/performed               Past Medical History:  Diagnosis Date   ADHD (attention deficit hyperactivity disorder)    Arthritis    Asthma    Cancer (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 ([place]) AGE 16   HAD TO LEARN TO WALK AGAIN   Iron deficiency anemia due to chronic blood loss 09/09/2014   Low TSH level 09/08/2014   Lumbar herniated disc    Menorrhagia 09/08/2014   Migraine    CLUSTER AND MIGRAINES   Multiple thyroid nodules    Ovarian mass, right 09/09/2014   PE (pulmonary embolism) 2016   Peripheral neuropathy    FINGER TIPS AND TOES NUMB SOME   Preeclampsia 1983   WITH PREGNANCY   PTSD (post-traumatic stress disorder)    Scoliosis    Seizures ([place]) AGE 43   NONE SINCE, HAD CHICKEN POX THEN   Past Surgical History:  Procedure Laterality Date   ANGIOGRAM TO LEG  08-13-15    RIGHT   CHOLECYSTECTOMY     IR GENERIC HISTORICAL  04/04/2016   IR RADIOLOGIST EVAL & MGMT 04/04/2016 [name], MD [facility] INTERV RAD   RADIOLOGY WITH ANESTHESIA N/A 07/17/2021   Procedure: MRI LUMBAR SPINE WITHOUT CONTRAST; MRI CERVICAL SPINE WITHOUT CONTRAST WITH ANESTHESIA;  Surgeon: Radiologist, Medication, MD;  Location: [facility] OR;  Service: Radiology;  Laterality: N/A;   THYROIDECTOMY N/A 11/17/2015   Procedure: TOTAL THYROIDECTOMY;  Surgeon: Armandina Gemma, MD;  Location: [facility] ORS;  Service: General;  Laterality: N/A;   UTERINE ARTERY EMBOLIZATION Bilateral 08/13/2015   Patient Active Problem List   Diagnosis Date Noted   Spinal stenosis excluding cervical region 11/21/2021   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   Abdominal pannus 08/21/2021   Aortic atherosclerosis ([place]) 08/21/2021   At high risk for falls 04/20/2020   Hyperlipidemia 02/16/2020   Adrenal mass, right ([place]) 04/29/2019   Chronic cough 03/12/2018   Left shoulder pain 10/14/2016   Hypertension 10/14/2016   S/P thyroidectomy 06/27/2016   Chronic bilateral low back pain without sciatica 04/22/2016   Liver lesion 12/24/2015   Controlled type 2 diabetes mellitus with complication, with long-term current use of  insulin ([place]) 10/10/2015   Vision loss 08/25/2015   Decreased vision in both eyes 08/25/2015   Prolonged Q-T interval on ECG 08/19/2015   History of pulmonary embolism 08/10/2015   Mild persistent asthma 04/13/2015   Fibroid, uterine    Morbid obesity ([place]) 11/11/2014   Atypical chest pain 11/08/2014   Sinus tachycardia 11/08/2014   Migraine 10/14/2014   Menorrhagia 09/08/2014    REFERRING DIAG:  M48.02 (ICD-10-CM) - Spinal stenosis of cervical region M47.26 (ICD-10-CM) - Other spondylosis with radiculopathy, lumbar region M50.30 (ICD-10-CM) - Degenerative disc disease, cervical M47.812 (ICD-10-CM) - Spondylosis of cervical spine M77.8 (ICD-10-CM) - Tendinitis of shoulder, unspecified  laterality  THERAPY DIAG:  Muscle weakness (generalized)  Difficulty in walking, not elsewhere classified  Other abnormalities of gait and mobility  Unsteadiness on feet  Chronic right shoulder pain  PERTINENT HISTORY:  HTN, peripheral neuropathy, DM II, PE, thyroid cancer, COPD  PRECAUTIONS: Fall, no OH lifting per Dr. [name]  SUBJECTIVE: Patient report pain in her lower back and recent visit to MD showed she had broken her toe, but is healing well and is not having any issues.  Pain: Are you having pain? Yes NPRS: 5/10 LBP, 0/10 neck and RUE  Pain location: see above PAIN TYPE: sharp Pain description: intermittent  Aggravating factors: prolonged standing, walking Relieving factors: rest   OBJECTIVE:  *Unless otherwise noted, objective information collected previously*   PATIENT SURVEYS:  Modified Oswestry 86% disability; 09/28/2021: 66% disability Quick Dash 87% disability; 09/28/2021: 62% disability    CERVICAL AROM/PROM   A/PROM A/PROM (deg) 08/06/2021 AROM 09/28/2021  Right rotation 43 p! 42 minor pain  Left rotation 35 48 minor pain   (Blank rows = not tested)   UE AROM/PROM:   A/PROM Right 08/06/2021 Left 08/06/2021 Right 09/28/2021  Shoulder flexion 82 WFL 150 p! At end range  Shoulder extension       Shoulder abduction 75 WFL 128 p! At end range  Shoulder adduction       Shoulder extension       Shoulder internal rotation 15 WFL 70  Shoulder external rotation 20 WFL 15  Elbow flexion       Elbow extension       Wrist flexion       Wrist extension       Wrist ulnar deviation       Wrist radial deviation       Wrist pronation       Wrist supination        (Blank rows = not tested)   UE MMT:   MMT Right 08/06/2021 Left 08/06/2021 Right 09/28/2021 Left 09/28/2021  Shoulder flexion 2+/5 3+/5 4/5 4/5  Shoulder abduction 2+/5 3+/5 4/5 4/5  Shoulder internal rotation 3/5 3+/5 5/5 5/5  Shoulder external rotation 35 3+/5 4/5 4/5  Grip strength 55# 54#      (Blank rows = not tested)   LE MMT:   MMT Right 08/06/2021 Left 08/06/2021 Right 09/14/2021 Left 09/14/2021  Hip flexion 3+/5 2+/5 4/5 3/5  Hip abduction 3+/5 2+/5    Hip adduction 3+/5 3/5    Knee flexion 4/5 3/5 4+/5 3+/5  Knee extension 4/5 3/5 4/5 3+/5   (Blank rows = not tested)   Orthostatics:         Sitting: 114/88            Standing: 114/84    FUNCTIONAL TESTS:  30 Second Sit to Stand: 2 reps 09/12/2021: 10 reps with UE support  Patient able to ambulate 165 ft using rollator before needing seated rest break - 11/19/2021    TODAY'S TREATMENT:  [facility] Adult PT Treatment:                                                DATE: 12/14/2021 Aquatic therapy at [address] - therapeutic pool temp 91 degrees Pt enters building ambulating with rollator.  Treatment took place in water 3.8 to  4 ft 8 in.feet deep depending upon activity.  Pt entered and exited the pool via stair and handrails independently  Pt pain level 5/10 at initiation of water walking.  Therapeutic Exercise: Walking forward/backwards/side stepping x2 length of pool each Runners stretch on bottom step x30" BIL Hamstring stretch on bottom step x30" BIL STS from 3rd step from bottom x10 with BIL UE support Standing thoracic rotation with hands in neutral for resistance x1' At edge of pool, pt performed LE exercise: Hip abd/add x10 BIL Hip ext/flex with knee straight x 10 BIL Hip Circles CC/CCW x10 each BIL Marching hip flexion to knee extension x10 BIL Squats x20 Heel raises x20 Sitting on bench in water: Kickboard push/pull x1' Kickboard push downs x1' Kickboard thoracic roation x1'  Pt requires the buoyancy of water for active assisted exercises with buoyancy supported for strengthening and AROM exercises. Hydrostatic pressure also supports joints by unweighting joint load by at least 50 % in 3-4 feet depth water. 80% in chest to neck deep water. Water will provide assistance with movement  using the current and laminar flow while the buoyancy reduces weight bearing. Pt requires the viscosity of the water for resistance with strengthening exercises.  [facility] Adult PT Treatment:                                                DATE: 11/19/2021 Therapeutic Exercise: NuStep L5 x 5 min Seated hip abduction with green 2 x 20 Seated march with green 2 x 20 Seated horizontal abduction with yellow 2 x 20 Seated double ER with yellow 2 x 20 Seated row with yellow 2 x 20 LAQ with 5# 2 x 20  Therapeutic Activity: Ambulating 165 ft and review and assessment of progress toward goals for PT POC re-certification   [facility] Adult PT Treatment:                                                DATE: 10/18/21 Therapeutic Exercise: LAQ 2x20 5# Seated march 2x20 5# Seated hamstring curl 2x20 GTB Seated heel/toe on rocker board 2x15 Seated hip adduction ball squeeze 5" hold x10  Seated shoulder OH press YTB 15x(hold on future appointments due to pain and lifting restrictions) Seated Row 2x15 YTB Seated horizontal abduction YTB 2 x 15(R shoulder)stabilizing only Seated bilateral ER YTB 2x15 Seated elbow flexion B YTB 2x15 Seated B hip abduction GTB 15x2    PATIENT EDUCATION:  Education details: continue HEP Person educated: Patient Education method: Explanation, Demonstration, and Handouts Education comprehension: verbalized understanding and returned demonstration   HOME EXERCISE PROGRAM: Access Code: [email or web address] URL: [email or web address] Date: 08/23/2021 Prepared  by: Octavio Manns  Exercises Standing Shoulder Row with Anchored Resistance - 1 x daily - 7 x weekly - 3 sets - 10 reps Seated Hip Abduction with Resistance - 1 x daily - 7 x weekly - 3 sets - 10 reps Seated March with Resistance - 1 x daily - 7 x weekly - 2 sets - 20 reps Seated Long Arc Quad - 1 x daily - 7 x weekly - 3 sets - 10 reps Seated Shoulder Horizontal Abduction with Resistance - Palms Down - 1 x daily - 7 x  weekly - 2 sets - 10 reps     ASSESSMENT: Patient presents to aquatic therapy with moderate pain in her lower back and reports that she recently saw her doctor this week and determined her toe had been broken. She states it is healing well and is not causing any functional problems at this time. Session today focused on LE and core strengthening, general conditioning, and improving activity tolerance while in the aquatic environment for the use of buoyancy to offload joints and decrease pain. Patient was able to tolerate all prescribed exercises in the aquatic environment with occasional rest breaks due to pain and fatigue and reports 7/10 pain at the end of the session. Patient continues to benefit from skilled PT services on land and aquatic based and should be progressed as able to improve functional independence.   Problem List: Abnormal gait, decreased activity tolerance, decreased balance, decreased endurance, decreased mobility, difficulty walking, decreased ROM, decreased strength, obesity, and pain     GOALS: Goals reviewed with patient? Yes   SHORT TERM GOALS:   STG Name Target Date Goal status  1 Pt will be compliant and knowledgeable with initial HEP for improved comfort and carryover Baseline: initial HEP given 08/27/2021 MET  2 Pt will self report low back and neck/R shoulder pain no greater than 6/10 for improved comfort and functional ability Baseline: 10/10 at worst 11/19/2021: patient reports 8/10 pain level 12/17/2021 PARTIALLY MET    LONG TERM GOALS:    LTG Name Target Date Goal status  1 Pt will self report low back and neck/R shoulder pain no greater than 6/10 for improved comfort and functional ability Baseline: 10/10 at worst 09/28/2021: 10 at worst 11/19/2021: patient reports 8/10 pain level 01/14/2022 PROGRESSING  2 Pt will decrease Quick DASH disability score to no greater than 65% as proxy for functional improvement Baseline: 87% disability 09/28/2021: 62%  disability 10/01/2021 ACHIEVED  3 Pt will decrease ODI disability score to no greater than 70% as proxy for functional improvement Baseline: 86% disability 09/28/2021: 66% disability 10/01/2021 ACHIEVED  4 Pt will increase reps in 30 Second Sit to Stand to no less than 5 reps for improved strength and functional mobility Baseline: 2 reps with UE support 09/12/2021: 10 reps with UE support 10/01/2021 ACHIEVED  5 Pt will increase R shoulder flex/abd AROM to no less than 125 deg for improved functional ability with home ADLs Baseline: see chart 09/28/2021: 150 degrees 10/01/2021 ACHIEVED  6 *Added 09/28/2021* Pt will report ability to stand 10 minutes in order to wash dishes with less limitation. Baseline: Pt reports inability to stand 3-4 minutes without need of a seated rest 11/19/2021: around 5 minutes 01/14/2022  PARTIALLY MET  7 Patient to ambulate 292f with LRAD on solid ground Baseline: 763fwith rollator Goal status: initial 11/19/2021: 165 ft with rollator 01/14/2022 PARTIALLY MET    PLAN: PT FREQUENCY: 1x/week   PT DURATION: 8 weeks  PLANNED INTERVENTIONS: Therapeutic exercises, Therapeutic activity, Neuro Muscular re-education, Balance training, Gait training, Patient/Family education, Joint mobilization, Aquatic Therapy, Cryotherapy, Moist heat, and Manual therapy   PLAN FOR NEXT SESSION: Update HEP as needed, continue LE and upper back strengthening tasks, restrict RUE lifting OH per MD restrictions, initiate aquatic PT    Check all possible CPT codes: 97026 - Re-evaluation, 97110- Therapeutic Exercise, [phone]- Neuro Re-education, [phone] - Gait Training, [phone] - Manual Therapy, 97530 - Therapeutic Activities, and [phone] - Aquatic therapy     If treatment provided at initial evaluation, no treatment charged due to lack of authorization.        Evelene Croon, [place] 12/14/21 1:38 PM

## 2021-12-17 ENCOUNTER — Ambulatory Visit: Payer: Medicaid Other

## 2021-12-17 DIAGNOSIS — R262 Difficulty in walking, not elsewhere classified: Secondary | ICD-10-CM

## 2021-12-17 DIAGNOSIS — G8929 Other chronic pain: Secondary | ICD-10-CM

## 2021-12-17 DIAGNOSIS — M5412 Radiculopathy, cervical region: Secondary | ICD-10-CM

## 2021-12-17 DIAGNOSIS — M6281 Muscle weakness (generalized): Secondary | ICD-10-CM | POA: Diagnosis not present

## 2021-12-17 NOTE — Therapy (Addendum)
OUTPATIENT PHYSICAL THERAPY TREATMENT NOTE/DC SUMMARY   Patient Name: Melinda [name] MRN: [id number] DOB:Aug 31, 1965, 56 y.o., female Today's Date: 12/17/2021  PCP: [name], MD REFERRING PROVIDER: [name], MD PHYSICAL THERAPY DISCHARGE SUMMARY  Visits from Start of Care: 16  Current functional level related to goals / functional outcomes: Goals partially met   Remaining deficits: Weakness, pain, decreased functional mobility   Education / Equipment: HEP   Patient agrees to discharge. Patient goals were partially met. Patient is being discharged due to not returning since the last visit.   PT End of Session - 12/17/21 1022     Visit Number 16    Number of Visits 22    Date for PT Re-Evaluation 01/14/22    Authorization Type [employer] MCD    Authorization - Visit Number 11    Authorization - Number of Visits 27    Progress Note Due on Visit 20    PT Start Time 2637    PT Stop Time 1115    PT Time Calculation (min) 30 min    Activity Tolerance Patient tolerated treatment well;Patient limited by fatigue;Patient limited by pain    Behavior During Therapy WFL for tasks assessed/performed                Past Medical History:  Diagnosis Date   ADHD (attention deficit hyperactivity disorder)    Arthritis    Asthma    Cancer ([place])    Thyroid   Chicken pox AGE 57   COPD (chronic obstructive pulmonary disease) ([place])    pt reported   Diabetes mellitus without complication ([place])    Diverticulosis, sigmoid    GERD (gastroesophageal reflux disease)    Glaucoma    BOTH EYES, NO EYE DROPS   Glaucoma    History of blood transfusion 08/2014    2UNITS GIVEN AND IRON GIVEN   Hyperglycemia 09/09/2014   Hypertension    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 ([place]) AGE 49   HAD TO LEARN TO WALK AGAIN   Iron deficiency anemia due to chronic blood loss 09/09/2014   Low TSH level 09/08/2014   Lumbar herniated disc    Menorrhagia  09/08/2014   Migraine    CLUSTER AND MIGRAINES   Multiple thyroid nodules    Ovarian mass, right 09/09/2014   PE (pulmonary embolism) 2016   Peripheral neuropathy    FINGER TIPS AND TOES NUMB SOME   Preeclampsia 1983   WITH PREGNANCY   PTSD (post-traumatic stress disorder)    Scoliosis    Seizures ([place]) AGE 57   NONE SINCE, HAD CHICKEN POX THEN   Past Surgical History:  Procedure Laterality Date   ANGIOGRAM TO LEG  08-13-15   RIGHT   CHOLECYSTECTOMY     IR GENERIC HISTORICAL  04/04/2016   IR RADIOLOGIST EVAL & MGMT 04/04/2016 [name], MD [facility] INTERV RAD   RADIOLOGY WITH ANESTHESIA N/A 07/17/2021   Procedure: MRI LUMBAR SPINE WITHOUT CONTRAST; MRI CERVICAL SPINE WITHOUT CONTRAST WITH ANESTHESIA;  Surgeon: Radiologist, Medication, MD;  Location: [facility];  Service: Radiology;  Laterality: N/A;   THYROIDECTOMY N/A 11/17/2015   Procedure: TOTAL THYROIDECTOMY;  Surgeon: Armandina Gemma, MD;  Location: [facility];  Service: General;  Laterality: N/A;   UTERINE ARTERY EMBOLIZATION Bilateral 08/13/2015   Patient Active Problem List   Diagnosis Date Noted   Spinal stenosis excluding cervical region 11/21/2021   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   Abdominal pannus 08/21/2021  Aortic atherosclerosis ([place]) 08/21/2021   At high risk for falls 04/20/2020   Hyperlipidemia 02/16/2020   Adrenal mass, right ([place]) 04/29/2019   Chronic cough 03/12/2018   Left shoulder pain 10/14/2016   Hypertension 10/14/2016   S/P thyroidectomy 06/27/2016   Chronic bilateral low back pain without sciatica 04/22/2016   Liver lesion 12/24/2015   Controlled type 2 diabetes mellitus with complication, with long-term current use of insulin ([place]) 10/10/2015   Vision loss 08/25/2015   Decreased vision in both eyes 08/25/2015   Prolonged Q-T interval on ECG 08/19/2015   History of pulmonary embolism 08/10/2015   Mild persistent asthma 04/13/2015   Fibroid, uterine    Morbid obesity ([place]) 11/11/2014    Atypical chest pain 11/08/2014   Sinus tachycardia 11/08/2014   Migraine 10/14/2014   Menorrhagia 09/08/2014    REFERRING DIAG:  M48.02 (ICD-10-CM) - Spinal stenosis of cervical region M47.26 (ICD-10-CM) - Other spondylosis with radiculopathy, lumbar region M50.30 (ICD-10-CM) - Degenerative disc disease, cervical M47.812 (ICD-10-CM) - Spondylosis of cervical spine M77.8 (ICD-10-CM) - Tendinitis of shoulder, unspecified laterality  THERAPY DIAG:  Muscle weakness (generalized)  Difficulty in walking, not elsewhere classified  Chronic right shoulder pain  Radiculopathy, cervical region  PERTINENT HISTORY:  HTN, peripheral neuropathy, DM II, PE, thyroid cancer, COPD  PRECAUTIONS: Fall, no OH lifting per Dr. [name]  SUBJECTIVE: Feels she may have aggravated her hernia and is experiencing abdominal and low back pain wit activities.  Symptoms can last several hours.  Pain: Are you having pain? Yes NPRS: 5/10 LBP, 0/10 neck and RUE  Pain location: see above PAIN TYPE: sharp Pain description: intermittent  Aggravating factors: prolonged standing, walking Relieving factors: rest   OBJECTIVE:  *Unless otherwise noted, objective information collected previously*   PATIENT SURVEYS:  Modified Oswestry 86% disability; 09/28/2021: 66% disability Quick Dash 87% disability; 09/28/2021: 62% disability    CERVICAL AROM/PROM   A/PROM A/PROM (deg) 08/06/2021 AROM 09/28/2021  Right rotation 43 p! 42 minor pain  Left rotation 35 48 minor pain   (Blank rows = not tested)   UE AROM/PROM:   A/PROM Right 08/06/2021 Left 08/06/2021 Right 09/28/2021  Shoulder flexion 82 WFL 150 p! At end range  Shoulder extension       Shoulder abduction 75 WFL 128 p! At end range  Shoulder adduction       Shoulder extension       Shoulder internal rotation 15 WFL 70  Shoulder external rotation 20 WFL 15  Elbow flexion       Elbow extension       Wrist flexion       Wrist extension       Wrist ulnar  deviation       Wrist radial deviation       Wrist pronation       Wrist supination        (Blank rows = not tested)   UE MMT:   MMT Right 08/06/2021 Left 08/06/2021 Right 09/28/2021 Left 09/28/2021  Shoulder flexion 2+/5 3+/5 4/5 4/5  Shoulder abduction 2+/5 3+/5 4/5 4/5  Shoulder internal rotation 3/5 3+/5 5/5 5/5  Shoulder external rotation 35 3+/5 4/5 4/5  Grip strength 55# 54#     (Blank rows = not tested)   LE MMT:   MMT Right 08/06/2021 Left 08/06/2021 Right 09/14/2021 Left 09/14/2021  Hip flexion 3+/5 2+/5 4/5 3/5  Hip abduction 3+/5 2+/5    Hip adduction 3+/5 3/5    Knee flexion 4/5 3/5 4+/5  3+/5  Knee extension 4/5 3/5 4/5 3+/5   (Blank rows = not tested)   Orthostatics:         Sitting: 114/88            Standing: 114/84    FUNCTIONAL TESTS:  30 Second Sit to Stand: 2 reps 09/12/2021: 10 reps with UE support  Patient able to ambulate 165 ft using rollator before needing seated rest break - 11/19/2021    TODAY'S TREATMENT:  [facility] Adult PT Treatment:                                                DATE: 12/17/21 Therapeutic Exercise: Seated hip abduction with green 1x 20 Seated horizontal abduction with yellow 2 x 20 Seated ER with yellow 2 x 20 Seated row with yellow 2 x 20 Seated shoulder extension YTB 2x20 Seated FAQs 20x2 B Seated heel slides 20x B with towel Seated hor abd YTB 20x2 with minimal ROM in R shoulder  [facility] Adult PT Treatment:                                                DATE: 12/14/2021 Aquatic therapy at [address] - therapeutic pool temp 91 degrees Pt enters building ambulating with rollator.  Treatment took place in water 3.8 to  4 ft 8 in.feet deep depending upon activity.  Pt entered and exited the pool via stair and handrails independently  Pt pain level 5/10 at initiation of water walking.  Therapeutic Exercise: Walking forward/backwards/side stepping x2 length of pool each Runners stretch on bottom step x30"  BIL Hamstring stretch on bottom step x30" BIL STS from 3rd step from bottom x10 with BIL UE support Standing thoracic rotation with hands in neutral for resistance x1' At edge of pool, pt performed LE exercise: Hip abd/add x10 BIL Hip ext/flex with knee straight x 10 BIL Hip Circles CC/CCW x10 each BIL Marching hip flexion to knee extension x10 BIL Squats x20 Heel raises x20 Sitting on bench in water: Kickboard push/pull x1' Kickboard push downs x1' Kickboard thoracic roation x1'  Pt requires the buoyancy of water for active assisted exercises with buoyancy supported for strengthening and AROM exercises. Hydrostatic pressure also supports joints by unweighting joint load by at least 50 % in 3-4 feet depth water. 80% in chest to neck deep water. Water will provide assistance with movement using the current and laminar flow while the buoyancy reduces weight bearing. Pt requires the viscosity of the water for resistance with strengthening exercises.  [facility] Adult PT Treatment:                                                DATE: 11/19/2021 Therapeutic Exercise: NuStep L5 x 5 min Seated hip abduction with green 2 x 20 Seated march with green 2 x 20 Seated horizontal abduction with yellow 2 x 20 Seated double ER with yellow 2 x 20 Seated row with yellow 2 x 20 LAQ with 5# 2 x 20  Therapeutic Activity: Ambulating 165 ft and review and assessment of progress toward  goals for PT POC re-certification       PATIENT EDUCATION:  Education details: continue HEP Person educated: Patient Education method: Explanation, Demonstration, and Handouts Education comprehension: verbalized understanding and returned demonstration   HOME EXERCISE PROGRAM: Access Code: [email or web address] URL: [email or web address] Date: 08/23/2021 Prepared by: Octavio Manns  Exercises Standing Shoulder Row with Anchored Resistance - 1 x daily - 7 x weekly - 3 sets - 10 reps Seated Hip Abduction with Resistance  - 1 x daily - 7 x weekly - 3 sets - 10 reps Seated March with Resistance - 1 x daily - 7 x weekly - 2 sets - 20 reps Seated Long Arc Quad - 1 x daily - 7 x weekly - 3 sets - 10 reps Seated Shoulder Horizontal Abduction with Resistance - Palms Down - 1 x daily - 7 x weekly - 2 sets - 10 reps     ASSESSMENT: Today's treatment option and scope limited by recent hernia exacerbation and limited use of R shoulder.  LE exercises hindered by abdominal and low back discomfort.  No increase in symptom severity reported at end of session.  Problem List: Abnormal gait, decreased activity tolerance, decreased balance, decreased endurance, decreased mobility, difficulty walking, decreased ROM, decreased strength, obesity, and pain     GOALS: Goals reviewed with patient? Yes   SHORT TERM GOALS:   STG Name Target Date Goal status  1 Pt will be compliant and knowledgeable with initial HEP for improved comfort and carryover Baseline: initial HEP given 08/27/2021 MET  2 Pt will self report low back and neck/R shoulder pain no greater than 6/10 for improved comfort and functional ability Baseline: 10/10 at worst 11/19/2021: patient reports 8/10 pain level 12/17/2021 PARTIALLY MET    LONG TERM GOALS:    LTG Name Target Date Goal status  1 Pt will self report low back and neck/R shoulder pain no greater than 6/10 for improved comfort and functional ability Baseline: 10/10 at worst 09/28/2021: 10 at worst 11/19/2021: patient reports 8/10 pain level 01/14/2022 PROGRESSING  2 Pt will decrease Quick DASH disability score to no greater than 65% as proxy for functional improvement Baseline: 87% disability 09/28/2021: 62% disability 10/01/2021 ACHIEVED  3 Pt will decrease ODI disability score to no greater than 70% as proxy for functional improvement Baseline: 86% disability 09/28/2021: 66% disability 10/01/2021 ACHIEVED  4 Pt will increase reps in 30 Second Sit to Stand to no less than 5 reps for improved strength and  functional mobility Baseline: 2 reps with UE support 09/12/2021: 10 reps with UE support 10/01/2021 ACHIEVED  5 Pt will increase R shoulder flex/abd AROM to no less than 125 deg for improved functional ability with home ADLs Baseline: see chart 09/28/2021: 150 degrees 10/01/2021 ACHIEVED  6 *Added 09/28/2021* Pt will report ability to stand 10 minutes in order to wash dishes with less limitation. Baseline: Pt reports inability to stand 3-4 minutes without need of a seated rest 11/19/2021: around 5 minutes 01/14/2022  PARTIALLY MET  7 Patient to ambulate 227f with LRAD on solid ground Baseline: 710fwith rollator Goal status: initial 11/19/2021: 165 ft with rollator 01/14/2022 PARTIALLY MET    PLAN: PT FREQUENCY: 1x/week   PT DURATION: 8 weeks   PLANNED INTERVENTIONS: Therapeutic exercises, Therapeutic activity, Neuro Muscular re-education, Balance training, Gait training, Patient/Family education, Joint mobilization, Aquatic Therapy, Cryotherapy, Moist heat, and Manual therapy   PLAN FOR NEXT SESSION: Update HEP as needed, continue LE and upper back strengthening tasks, restrict RUE lifting OH  per MD restrictions, initiate aquatic PT, f/u on hernia symptoms    Check all possible CPT codes: 20355 - Re-evaluation, 97110- Therapeutic Exercise, [phone]- Neuro Re-education, [phone] - Gait Training, [phone] - Manual Therapy, [phone] - Therapeutic Activities, and [phone] - Aquatic therapy     If treatment provided at initial evaluation, no treatment charged due to lack of authorization.        Leroy Sea PT  12/17/21 11:26 AM

## 2021-12-19 ENCOUNTER — Ambulatory Visit: Payer: Medicaid Other | Admitting: Physical Therapy

## 2021-12-24 ENCOUNTER — Encounter: Payer: Self-pay | Admitting: Internal Medicine

## 2021-12-24 ENCOUNTER — Ambulatory Visit: Payer: Medicaid Other | Attending: Internal Medicine | Admitting: Internal Medicine

## 2021-12-24 DIAGNOSIS — R109 Unspecified abdominal pain: Secondary | ICD-10-CM | POA: Diagnosis not present

## 2021-12-24 DIAGNOSIS — R600 Localized edema: Secondary | ICD-10-CM | POA: Diagnosis not present

## 2021-12-24 DIAGNOSIS — E1169 Type 2 diabetes mellitus with other specified complication: Secondary | ICD-10-CM | POA: Diagnosis not present

## 2021-12-24 DIAGNOSIS — F321 Major depressive disorder, single episode, moderate: Secondary | ICD-10-CM | POA: Diagnosis not present

## 2021-12-24 DIAGNOSIS — J4540 Moderate persistent asthma, uncomplicated: Secondary | ICD-10-CM

## 2021-12-24 DIAGNOSIS — I152 Hypertension secondary to endocrine disorders: Secondary | ICD-10-CM | POA: Diagnosis not present

## 2021-12-24 DIAGNOSIS — E1159 Type 2 diabetes mellitus with other circulatory complications: Secondary | ICD-10-CM | POA: Diagnosis not present

## 2021-12-24 LAB — POCT URINALYSIS DIP (CLINITEK)
Blood, UA: NEGATIVE
Glucose, UA: 100 mg/dL — AB
Ketones, POC UA: NEGATIVE mg/dL
Nitrite, UA: NEGATIVE
POC PROTEIN,UA: 30 — AB
Spec Grav, UA: 1.025 (ref 1.010–1.025)
Urobilinogen, UA: 1 E.U./dL
pH, UA: 7 (ref 5.0–8.0)

## 2021-12-24 MED ORDER — FUROSEMIDE 20 MG PO TABS: 20.0000 mg | ORAL_TABLET | Freq: Every day | ORAL | 3 refills | Status: DC

## 2021-12-24 MED ORDER — LORATADINE 10 MG PO TABS: 10.0000 mg | ORAL_TABLET | Freq: Every day | ORAL | 3 refills | Status: DC

## 2021-12-25 ENCOUNTER — Ambulatory Visit: Payer: Medicaid Other

## 2021-12-25 DIAGNOSIS — F4310 Post-traumatic stress disorder, unspecified: Secondary | ICD-10-CM | POA: Diagnosis not present

## 2021-12-28 DIAGNOSIS — E278 Other specified disorders of adrenal gland: Secondary | ICD-10-CM | POA: Diagnosis not present

## 2021-12-28 DIAGNOSIS — E119 Type 2 diabetes mellitus without complications: Secondary | ICD-10-CM | POA: Diagnosis not present

## 2021-12-28 NOTE — Progress Notes (Unsigned)
NEUROLOGY FOLLOW UP OFFICE NOTE  Melinda [name] [id number]  Assessment/Plan:   1.  Migraine without aura, without status migrainosus, not intractable - stable 2.  Cerebral aneurysm - stable 3.  Subjective unilateral facial numbness.  Off and on over several years.  Previously she has reported right sided facial sensation.  Unclear etiology but do not suspect stroke or other primary neurologic etiology.   1.  Migraine prevention:  topiramate '200mg'$  at bedtime.  Eye doctor aware she is on it.  Glaucoma is being monitored.  Would not start CGRP inhibitor as she continues to do well 2.  Migraine rescue: Tylenol with or without Flexeril 3.  Limit use of pain relievers to no more than 2 days out of week to prevent risk of rebound or medication-overuse headache. 4.  Keep headache diary 5.  Repeat CTA of head in 5 years unless indicated sooner 6.  Follow up 12 months   Subjective:  Melinda [name] is a 56 year old right-handed African-American woman with type 2 diabetes mellitus, glaucoma, asthma, chronic low back pain, PTSD, PE/DVT and history of thyroid cancer status post thyroidectomy who follows up for migraines.   UPDATE: CTA head and neck on 05/11/2021 personally reviewed showed stable appearance of tortuous proximal right PCA with focal dilatation possibly reflecting small aneurysm.  MRI of brain personally reviewed showed mild chronic small vessel ischemic changes but no acute abnormality.    Plan was to start [place] in November.  This was denied but was subsequently approved for [facility] but she never picked it up because she thought it wasn't approved. Intensity:  Moderate Duration:  1 hour with Tylenol w/wo Flexeril Frequency:  3 days per month Frequency of abortive medication: Has not been treating migraines. Current NSAIDS:  Contraindicated (on anticoagulation) Current analgesics:  Tylenol, tramadol Current triptans:  none Current ergotamine:  none Current anti-emetic:  Zofran ODT  '4mg'$  Current muscle relaxants:  Flexeril Current anti-anxiolytic:  none Current sleep aide:  none Current Antihypertensive medications:  Metoprolol '50mg'$  twice daily, HCTZ, prazosin Current Antidepressant medications:  Wellbutrin '300mg'$ , citalopram '40mg'$ , doxepin '10mg'$  Current Anticonvulsant medications:  topiramate 200 mg at bedtime, gabapentin '300mg'$  three times daily Current anti-CGRP:  none Current Vitamins/Herbal/Supplements:  Ferrous sulfate Current Antihistamines/Decongestants:  Claritin, Benadryl Other therapy:  Essential oils, lavender  Sees psychiatrist and psychologist which helps   Caffeine: No Alcohol: No Smoker: No Diet: Hydrates.  No soda.  Eats baked and boiled chicken, mostly vegetables.  Little red meat only during menses.  No fried foods. Exercise: Stationary bike, limited due to chronic pain Depression: Yes; Anxiety: Yes Other pain: Chronic pain - spinal stenosis of lumbar spine Sleep hygiene: Poor.  Has night terrors secondary to PTSD.  Sleep study last week showed no sleep apnea.     HISTORY: Onset:  Since her 14s Location:  Right sided Quality:  squeezing Initial Intensity:  8.5/10 (sometimes 10/10) Aura:  Sometimes preceded with visual aura (sees a pinwheel) Prodrome:  no Associated symptoms: Nausea, photophobia, phonophobia.  No worse headache of life, change in quality, wakes her up from sleep. Initial Duration:  3 hours to all day Initial Frequency:  daily Triggers:  Apples, aged cheese, menstrual cycle Relieving factors:  None Activity:  Light activity aggravates   Past NSAIDS:  ibuprofen Past analgesics:  Excedrin Past abortive triptans:  Sumatriptan tablet/NS/[place], rizatriptan '10mg'$  Past muscle relaxants:  no Past anti-emetic:  no Past antihypertensive medications:  no Past antidepressant medications:  amitriptyline '50mg'$  Past anticonvulsant medications:  no Past vitamins/Herbal/Supplements:  no Past antihistamines/decongestants:  no Other past  therapies:  no   Family history of headache:  Son has migraines.   CT of head from 01/16/15 was unremarkable.   In September 2018, she developed a "thunderclap" headache.  Onset was over a month ago.  It lasts for a couple of seconds and occurs once a week.  She has not had any vision loss, unilateral numbness or weakness or vertigo.   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  Repeat CTA of head on 02/25/2019 and 06/09/2020 were stable.  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 ([place]) AGE 79   HAD TO LEARN TO WALK AGAIN   Iron deficiency anemia due to chronic blood loss 09/09/2014   Low TSH level 09/08/2014   Lumbar herniated disc    Menorrhagia 09/08/2014   Migraine    CLUSTER AND MIGRAINES   Multiple thyroid nodules    Ovarian mass, right 09/09/2014   PE (pulmonary embolism) 2016   Peripheral neuropathy    FINGER TIPS AND TOES NUMB SOME   Preeclampsia 1983   WITH PREGNANCY   PTSD (post-traumatic stress disorder)    Scoliosis    Seizures ([facility]) AGE 22   NONE SINCE, HAD CHICKEN POX THEN    MEDICATIONS: Current Outpatient Medications on File Prior to Visit  Medication Sig Dispense Refill   acetaminophen (TYLENOL) 500 MG tablet Take 1 tablet (500 mg total) by mouth every 6 (six) hours as  needed. 30 tablet 0   albuterol (PROVENTIL) (2.5 MG/3ML) 0.083% nebulizer solution Take 3 mLs (2.5 mg total) by nebulization every 6 (six) hours as needed for wheezing or shortness of breath. 3 mL 3   albuterol (VENTOLIN HFA) 108 (90 Base) MCG/ACT inhaler Inhale 2 puffs into the lungs every 6 (six) hours as needed for wheezing or shortness of breath. 8 g 2   atorvastatin (LIPITOR) 20 MG tablet TAKE ONE TABLET BY MOUTH ONCE DAILY AT BEDTIME 30 tablet 2   budesonide-formoterol (SYMBICORT) 160-4.5 MCG/ACT inhaler Inhale 2 puffs into the lungs 2 (two) times daily. 2 each 6   buPROPion (WELLBUTRIN XL) 300 MG 24 hr tablet Take 300 mg by mouth daily.   2   busPIRone (BUSPAR) 10 MG tablet Take 10 mg by mouth 2 (two) times daily.     Cholecalciferol (VITAMIN D3) 10 MCG (400 UNIT) CAPS Take 400 Int'l Units/day by mouth daily. 100 capsule 2   cyclobenzaprine (FLEXERIL) 10 MG tablet TAKE 1 TABLET (10 MG TOTAL) BY MOUTH 3 (THREE) TIMES DAILY AS NEEDED FOR MUSCLE SPASMS. 30 tablet 1   doxepin (SINEQUAN) 50 MG capsule Take 150 mg by mouth at bedtime.     ELIQUIS 5 MG TABS tablet TAKE ONE  TABLET BY MOUTH TWICE DAILY MORNING AND EVENING 60 tablet 2   Erenumab-aooe (AIMOVIG) 140 MG/ML SOAJ Inject 140 mg into the skin every 28 (twenty-eight) days. 1.12 mL 5   furosemide (LASIX) 20 MG tablet Take 1 tablet (20 mg total) by mouth daily. Stop HCTZ (Hydrochlorothiazide) 30 tablet 3   gabapentin (NEURONTIN) 300 MG capsule TAKE ONE CAPSULE BY MOUTH THREE TIMES DAILY (AM+NOON+BEDTIME) 90 capsule 6   loratadine (CLARITIN) 10 MG tablet Take 1 tablet (10 mg total) by mouth daily. 30 tablet 3   metoprolol tartrate (LOPRESSOR) 50 MG tablet TAKE ONE TABLET BY MOUTH TWICE DAILY (AM+BEDTIME) 60 tablet 2   montelukast (SINGULAIR) 10 MG tablet TAKE 1 TABLET BY MOUTH EVERY EVENING AT BEDTIME 30 tablet 2   prazosin (MINIPRESS) 2 MG capsule Take 6 mg by mouth at bedtime.     Semaglutide,0.25 or 0.'5MG'$ /DOS, (OZEMPIC, 0.25 OR 0.5 MG/DOSE,)  2 MG/1.5ML SOPN Titrate 0.25 mg once weekly for 4 weeks then 0.5 mg once weekly for 2 weeks     silver sulfADIAZINE (SILVADENE) 1 % cream Apply 1 application. topically daily. 50 g 0   sodium chloride (OCEAN) 0.65 % SOLN nasal spray Place 1 spray into both nostrils as needed for congestion. 30 mL 0   SYNTHROID 200 MCG tablet Take 1 tablet (200 mcg total) by mouth daily before breakfast. 30 tablet 4   topiramate (TOPAMAX) 200 MG tablet TAKE 1 TABLET (200 MG TOTAL) BY MOUTH AT BEDTIME. 90 tablet 1   triamcinolone cream (KENALOG) 0.1 % Apply 1 application. topically 2 (two) times daily. 30 g 1   VRAYLAR 1.5 MG capsule Take 1.5 mg by mouth daily.     No current facility-administered medications on file prior to visit.    ALLERGIES: Allergies  Allergen Reactions   Ceftriaxone Anaphylaxis    ROCEPHIN   Metformin Anaphylaxis    ALL   Penicillins Shortness Of Breath    Has patient had a PCN reaction causing immediate rash, facial/tongue/throat swelling, SOB or lightheadedness with hypotension: Yes Has patient had a PCN reaction causing severe rash involving mucus membranes or skin necrosis: No Has patient had a PCN reaction that required hospitalization No Has patient had a PCN reaction occurring within the last 10 years: No If all of the above answers are "NO", then may proceed with Cephalosporin use.    Shellfish Allergy Anaphylaxis   Flonase [Fluticasone Propionate]     Makes migraines worse   Gold-Containing Drug Products     HANDS ITCH   Nickel     HANDS SWELL   Citrus Rash    FAMILY HISTORY: Family History  Problem Relation Age of Onset   Diabetes Mother    Breast cancer Mother    CAD Mother    Hypertension Mother    Alcohol abuse Father    Hypertension Father    Breast cancer Maternal Aunt    Breast cancer Maternal Aunt       Objective:  Blood pressure 135/84, pulse (!) 104, height '5\' 10"'$  (1.778 m), weight (!) 344 lb (156 kg), SpO2 96 %. General: No acute distress.   Patient appears well-groomed.   Head:  Normocephalic/atraumatic Eyes:  Fundi examined but not visualized Neck: supple, no paraspinal tenderness, full range of motion Heart:  Regular rate and rhythm Neurological Exam: alert and oriented to person, place, and time.  Speech fluent and not dysarthric, language intact.  Endorses reduced left V1-V3.  Otherwise, CN II-XII intact. Bulk and tone normal, muscle strength  5/5 throughout.  Sensation to light touch intact.  Deep tendon reflexes 2+ throughout, toes downgoing.  Finger to nose testing intact.  Gait steady, Romberg negative.   Metta Clines, DO  CC: [name], MD

## 2022-01-02 ENCOUNTER — Encounter: Payer: Self-pay | Admitting: Neurology

## 2022-01-02 ENCOUNTER — Ambulatory Visit: Payer: Medicaid Other | Admitting: Neurology

## 2022-01-02 VITALS — BP 135/84 | HR 104 | Ht 70.0 in | Wt 344.0 lb

## 2022-01-02 DIAGNOSIS — G43009 Migraine without aura, not intractable, without status migrainosus: Secondary | ICD-10-CM | POA: Diagnosis not present

## 2022-01-02 DIAGNOSIS — I671 Cerebral aneurysm, nonruptured: Secondary | ICD-10-CM | POA: Diagnosis not present

## 2022-01-02 DIAGNOSIS — E278 Other specified disorders of adrenal gland: Secondary | ICD-10-CM | POA: Diagnosis not present

## 2022-01-02 NOTE — Patient Instructions (Signed)
Topiramate '200mg'$  at bedtime.  Monitor glaucoma with the eye doctor Take Tylenol with or without Flexeril as needed for headache Follow up one year

## 2022-01-03 ENCOUNTER — Other Ambulatory Visit: Payer: Self-pay | Admitting: Neurology

## 2022-01-04 ENCOUNTER — Ambulatory Visit: Payer: Medicaid Other

## 2022-01-04 DIAGNOSIS — F411 Generalized anxiety disorder: Secondary | ICD-10-CM | POA: Diagnosis not present

## 2022-01-04 DIAGNOSIS — F4310 Post-traumatic stress disorder, unspecified: Secondary | ICD-10-CM | POA: Diagnosis not present

## 2022-01-04 DIAGNOSIS — F331 Major depressive disorder, recurrent, moderate: Secondary | ICD-10-CM | POA: Diagnosis not present

## 2022-01-07 DIAGNOSIS — E278 Other specified disorders of adrenal gland: Secondary | ICD-10-CM | POA: Diagnosis not present

## 2022-01-08 ENCOUNTER — Other Ambulatory Visit: Payer: Self-pay | Admitting: Obstetrics and Gynecology

## 2022-01-08 NOTE — Patient Outreach (Signed)
Care Coordination  01/08/2022  Shelbe Haglund [birth year] [phone]  RNCM returned patient's phone call.  Aida Raider RN, BSN [place]  [facility] - Managed Medicaid High Risk [phone].

## 2022-01-09 DIAGNOSIS — F4325 Adjustment disorder with mixed disturbance of emotions and conduct: Secondary | ICD-10-CM | POA: Diagnosis not present

## 2022-01-14 ENCOUNTER — Encounter: Payer: Self-pay | Admitting: Neurology

## 2022-01-15 ENCOUNTER — Other Ambulatory Visit: Payer: Self-pay | Admitting: Obstetrics and Gynecology

## 2022-01-15 NOTE — Patient Instructions (Signed)
Hi Melinda [name] talking with you-have a great week !!  Melinda [name] was given information about Medicaid Managed Care team care coordination services as a part of their [place] benefit. Melinda [name] verbally consented to engagement with the [facility] Managed Care team.   If you are experiencing a medical emergency, please call 911 or report to your local emergency department or urgent care.   If you have a non-emergency medical problem during routine business hours, please contact your provider's office and ask to speak with a nurse.   For questions related to your [facility], please call: [phone] or visit the homepage here: [email or web address]  If you would like to schedule transportation through your [facility], please call the following number at least 2 days in advance of your appointment: [phone]   Rides for urgent appointments can also be made after hours by calling Member Services.  Call the [place] at [phone], at any time, 24 hours a day, 7 days a week. If you are in danger or need immediate medical attention call 911.  If you would like help to quit smoking, call 1-800-QUIT-NOW 605-148-3019) OR Espaol: 1-855-Djelo-Ya (1-856-314-9702) o para ms informacin haga clic aqu or Text READY to 200-400 to register via text  Melinda [name] - following are the goals we discussed in your visit today:   Goals Addressed             This Visit's Progress    Protect My Health         Timeframe:  Long-Range Goal Priority:  Medium Start Date:    07/14/20                         Expected End Date:  ongoing          Follow Up Date: 02/15/22 - schedule appointment for vaccines needed due to my age or health - schedule recommended health tests (blood work, mammogram, colonoscopy, pap test) - schedule and keep appointment for  annual check-up   01/15/22:  Patient recently seen and evaluated by Ortho, Neuro, and PCP.   Patient verbalizes understanding of instructions and care plan provided today and agrees to view in [place]. Active MyChart status and patient understanding of how to access instructions and care plan via MyChart confirmed with patient.     The Managed Medicaid care management team will reach out to the patient again over the next 30 days.  The  Patient has been provided with contact information for the Managed Medicaid care management team and has been advised to call with any health related questions or concerns.   Aida Raider RN, BSN [place] Management Coordinator - Managed Medicaid High Risk [phone]   Following is a copy of your plan of care:  Care Plan : General Plan of Care (Adult)  Updates made by Melinda Medicus, RN since 01/15/2022 12:00 AM     Problem: Health Promotion or Disease Self-Management (General Plan of Care)   Priority: Medium  Onset Date: 10/20/2020     Long-Range Goal: Self-Management Plan Developed   Start Date: 07/14/2020  Expected End Date: 03/09/2022  Recent Progress: On track  Priority: Medium  Note:    Current Barriers:  Chronic Disease Management support and education needs Patient with anxiety and depression-has appointment with Psychiatrist  scheduled every 3 months, therapist every 2 weeks 01/15/22:  patient's  blood pressure elevated today-158/108-patient using wrist cuff-will contact provider for prescription for regular cuff, to recheck and notify PCP.  Patient changing endocrinologists to [place] -has appt tomorrow-Ozempic dose increased, A1C=7.4 on 12/28/21.  Started on Lasix for lower extremity swelling.  Has upcoming appointments with surgery and ortho. Nurse Case Manager Clinical Goal(s):  Over the next 90 days, patient will attend all scheduled medical appointments:  Over the next 30 days, patient will work  with CM team pharmacist to review medications. Interventions:  Inter-disciplinary care team collaboration (see longitudinal plan of care) Evaluation of current treatment plan  and patient's adherence to plan as established by provider. Reviewed medications with patient. Collaborated with pharmacy regarding medications. Discussed plans with patient for ongoing care management follow up and provided patient with direct contact information for care management team Reviewed scheduled/upcoming provider appointments. Collaborated with SW for psychotherapy referral. SW referral for psychotherapy provider-completed Pharmacy referral for medication review. Collaborated with PCP for new BP cuff Self Care Activities: Over the next 90 days, patient will:  -Self administers medications as prescribed Attends all scheduled provider appointments Calls pharmacy for medication refills Calls provider office for new concerns or questions  Follow Up Plan: The Managed Medicaid care management team will reach out to the patient again over the next 30 days.  The patient has been provided with contact information for the Managed Medicaid care management team and has been advised to call with any health related questions or concerns.

## 2022-01-15 NOTE — Patient Outreach (Signed)
Medicaid Managed Care   Nurse Care Manager Note  01/15/2022 Name:  Melinda [name] MRN:  [id number] DOB:  [birth year]  Melinda [name] is an 56 y.o. year old female who is a primary patient of Melinda Pier, MD.  The [facility] Managed Care Coordination team was consulted for assistance with:    Chronic healthcare management needs, DM, HTN, chronic pain, migraines, anxiety/depression, asthma  Melinda [name] was given information about Medicaid Managed Care Coordination team services today. Melinda [name] Patient agreed to services and verbal consent obtained.  Engaged with patient by telephone for follow up visit in response to provider referral for case management and/or care coordination services.   Assessments/Interventions:  Review of past medical history, allergies, medications, health status, including review of consultants reports, laboratory and other test data, was performed as part of comprehensive evaluation and provision of chronic care management services.  SDOH (Social Determinants of Health) assessments and interventions performed: SDOH Interventions    Flowsheet Row Most Recent Value  SDOH Interventions   Physical Activity Interventions --  [not able to engage in physical activity]     Care Plan  Allergies  Allergen Reactions   Ceftriaxone Anaphylaxis    ROCEPHIN   Metformin Anaphylaxis    ALL   Penicillins Shortness Of Breath    Has patient had a PCN reaction causing immediate rash, facial/tongue/throat swelling, SOB or lightheadedness with hypotension: Yes Has patient had a PCN reaction causing severe rash involving mucus membranes or skin necrosis: No Has patient had a PCN reaction that required hospitalization No Has patient had a PCN reaction occurring within the last 10 years: No If all of the above answers are "NO", then [name] proceed with Cephalosporin use.    Shellfish Allergy Anaphylaxis   Flonase [Fluticasone Propionate]     Makes migraines worse   Gold-Containing  Drug Products     HANDS ITCH   Nickel     HANDS SWELL   Citrus Rash   Medications Reviewed Today     Reviewed by Melinda Medicus, RN (Registered Nurse) on 01/15/22 at [phone]  Med List Status: <None>   Medication Order Taking? Sig Documenting Provider Last Dose Status Informant  acetaminophen (TYLENOL) 500 MG tablet [id number] No Take 1 tablet (500 mg total) by mouth every 6 (six) hours as needed. Melinda Oto, MD Taking Active   albuterol (PROVENTIL) (2.5 MG/3ML) 0.083% nebulizer solution [id number] No Take 3 mLs (2.5 mg total) by nebulization every 6 (six) hours as needed for wheezing or shortness of breath. Melinda Pier, MD Taking Active Self  albuterol (VENTOLIN HFA) 108 (90 Base) MCG/ACT inhaler [id number] No Inhale 2 puffs into the lungs every 6 (six) hours as needed for wheezing or shortness of breath. Melinda Pier, MD Taking Active Self  atorvastatin (LIPITOR) 20 MG tablet [id number] No TAKE ONE TABLET BY MOUTH ONCE DAILY AT BEDTIME  Patient not taking: Reported on 01/02/2022   Melinda Pier, MD Not Taking Active   budesonide-formoterol [facility]) 160-4.5 MCG/ACT inhaler [id number] No Inhale 2 puffs into the lungs 2 (two) times daily. Melinda Pier, MD Taking Active   buPROPion (WELLBUTRIN XL) 300 MG 24 hr tablet [id number] No Take 300 mg by mouth daily.  [provider] Taking Active Self  busPIRone (BUSPAR) 10 MG tablet [id number] No Take 10 mg by mouth 2 (two) times daily. [provider] Taking Active Self           Med Note (Melinda [name], Melinda [name]   Wed  Feb 18, 2018 10:03 PM)    Cholecalciferol (VITAMIN D3) 10 MCG (400 UNIT) CAPS [id number] No Take 400 Int'l Units/day by mouth daily. Melinda Pier, MD Taking Active   cyclobenzaprine (FLEXERIL) 10 MG tablet [id number] No TAKE 1 TABLET (10 MG TOTAL) BY MOUTH 3 (THREE) TIMES DAILY AS NEEDED FOR MUSCLE SPASMS. Melinda Pier, MD Taking Active   doxepin (SINEQUAN) 50 MG capsule [id number] No Take  150 mg by mouth at bedtime. [provider] Taking Active Self  ELIQUIS 5 MG TABS tablet [id number] No TAKE ONE TABLET BY MOUTH TWICE DAILY MORNING AND EVENING Melinda Pier, MD Taking Active   furosemide (LASIX) 20 MG tablet [id number] No Take 1 tablet (20 mg total) by mouth daily. Stop HCTZ (Hydrochlorothiazide) Melinda Pier, MD Taking Active   gabapentin (NEURONTIN) 300 MG capsule [id number] No TAKE ONE CAPSULE BY MOUTH THREE TIMES DAILY (AM+NOON+BEDTIME) Melinda Pier, MD Taking Active   loratadine (CLARITIN) 10 MG tablet [id number] No Take 1 tablet (10 mg total) by mouth daily. Melinda Pier, MD Taking Active   metoprolol tartrate (LOPRESSOR) 50 MG tablet [id number] No TAKE ONE TABLET BY MOUTH TWICE DAILY (AM+BEDTIME) Melinda Pier, MD Taking Active   montelukast (SINGULAIR) 10 MG tablet [id number] No TAKE 1 TABLET BY MOUTH EVERY EVENING AT BEDTIME Melinda Pier, MD Taking Active   prazosin (MINIPRESS) 2 MG capsule [id number] No Take 6 mg by mouth at bedtime. [provider] Taking Active Self  Semaglutide,0.25 or 0.'5MG'$ /DOS, (OZEMPIC, 0.25 OR 0.5 MG/DOSE,) 2 MG/1.5ML SOPN [id number] No Titrate 0.25 mg once weekly for 4 weeks then 0.5 mg once weekly for 2 weeks [provider] Taking Active   silver sulfADIAZINE (SILVADENE) 1 % cream [id number] No Apply 1 application. topically daily. Marzetta Board, DPM Taking Active   sodium chloride (OCEAN) 0.65 % SOLN nasal spray [id number] No Place 1 spray into both nostrils as needed for congestion. Melinda Pier, MD Taking Active Self  SYNTHROID 200 MCG tablet [id number] No Take 1 tablet (200 mcg total) by mouth daily before breakfast. Melinda Pier, MD Taking Active   topiramate (TOPAMAX) 200 MG tablet [id number]  TAKE 1 TABLET (200 MG TOTAL) BY MOUTH AT BEDTIME. [name], DO  Active   triamcinolone cream (KENALOG) 0.1 % [id number] No Apply 1 application. topically 2 (two) times daily. Melinda Pier, MD Taking Active   VRAYLAR 1.5 MG capsule [id number] No Take 1.5 mg by mouth daily. [provider] Taking Active            Patient Active Problem List   Diagnosis Date Noted   Spinal stenosis excluding cervical region 11/21/2021   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   Abdominal pannus 08/21/2021   Aortic atherosclerosis ([place]) 08/21/2021   At high risk for falls 04/20/2020   Hyperlipidemia 02/16/2020   Adrenal mass, right ([place]) 04/29/2019   Chronic cough 03/12/2018   Left shoulder pain 10/14/2016   Hypertension 10/14/2016   S/P thyroidectomy 06/27/2016   Chronic bilateral low back pain without sciatica 04/22/2016   Liver lesion 12/24/2015   Controlled type 2 diabetes mellitus with complication, with long-term current use of insulin ([place]) 10/10/2015   Vision loss 08/25/2015   Decreased vision in both eyes 08/25/2015   Prolonged Q-T interval on ECG 08/19/2015   History of pulmonary embolism 08/10/2015   Mild persistent asthma 04/13/2015   Fibroid, uterine    Morbid obesity ([place]) 11/11/2014   Atypical chest pain  11/08/2014   Sinus tachycardia 11/08/2014   Migraine 10/14/2014   Menorrhagia 09/08/2014   Conditions to be addressed/monitored per PCP order:  Chronic healthcare management needs, DM, HTN, chronic pain, migraines, anxiety/depression, asthma, HLD  Care Plan : General Plan of Care (Adult)  Updates made by Melinda Medicus, RN since 01/15/2022 12:00 AM     Problem: Health Promotion or Disease Self-Management (General Plan of Care)   Priority: Medium  Onset Date: 10/20/2020     Long-Range Goal: Self-Management Plan Developed   Start Date: 07/14/2020  Expected End Date: 03/09/2022  Recent Progress: On track  Priority: Medium  Note:    Current Barriers:  Chronic Disease Management support and education needs Patient with anxiety and depression-has appointment with Psychiatrist  scheduled every 3 months, therapist every 2 weeks 01/15/22:   patient's blood pressure elevated today-158/108-patient using wrist cuff-will contact provider for prescription for regular cuff, to recheck and notify PCP.  Patient changing endocrinologists to [place] -has appt tomorrow-Ozempic dose increased, A1C=7.4 on 12/28/21.  Started on Lasix for lower extremity swelling.  Has upcoming appointments with surgery and ortho. Nurse Case Manager Clinical Goal(s):  Over the next 90 days, patient will attend all scheduled medical appointments:  Over the next 30 days, patient will work with CM team pharmacist to review medications. Interventions:  Inter-disciplinary care team collaboration (see longitudinal plan of care) Evaluation of current treatment plan  and patient's adherence to plan as established by provider. Reviewed medications with patient. Collaborated with pharmacy regarding medications. Discussed plans with patient for ongoing care management follow up and provided patient with direct contact information for care management team Reviewed scheduled/upcoming provider appointments. Collaborated with SW for psychotherapy referral. SW referral for psychotherapy provider-completed Pharmacy referral for medication review. Collaborated with PCP for new BP cuff Self Care Activities: Over the next 90 days, patient will:  -Self administers medications as prescribed Attends all scheduled provider appointments Calls pharmacy for medication refills Calls provider office for new concerns or questions  Follow Up Plan: The Managed Medicaid care management team will reach out to the patient again over the next 30 days.  The patient has been provided with contact information for the Managed Medicaid care management team and has been advised to call with any health related questions or concerns.    Follow Up:  Patient agrees to Care Plan and Follow-up.  Plan: The Managed Medicaid care management team will reach out to the patient again over the next 30  days. and The  Patient has been provided with contact information for the Managed Medicaid care management team and has been advised to call with any health related questions or concerns.  Date/time of next scheduled RN care management/care coordination outreach:  02/15/22 at 0900

## 2022-01-16 ENCOUNTER — Telehealth: Payer: Self-pay | Admitting: Pharmacist

## 2022-01-16 DIAGNOSIS — I10 Essential (primary) hypertension: Secondary | ICD-10-CM | POA: Diagnosis not present

## 2022-01-16 DIAGNOSIS — I700 Atherosclerosis of aorta: Secondary | ICD-10-CM | POA: Diagnosis not present

## 2022-01-16 DIAGNOSIS — C73 Malignant neoplasm of thyroid gland: Secondary | ICD-10-CM | POA: Diagnosis not present

## 2022-01-16 DIAGNOSIS — E890 Postprocedural hypothyroidism: Secondary | ICD-10-CM | POA: Diagnosis not present

## 2022-01-16 DIAGNOSIS — Z85850 Personal history of malignant neoplasm of thyroid: Secondary | ICD-10-CM | POA: Diagnosis not present

## 2022-01-16 DIAGNOSIS — I671 Cerebral aneurysm, nonruptured: Secondary | ICD-10-CM | POA: Diagnosis not present

## 2022-01-16 DIAGNOSIS — Z794 Long term (current) use of insulin: Secondary | ICD-10-CM | POA: Diagnosis not present

## 2022-01-16 DIAGNOSIS — R1310 Dysphagia, unspecified: Secondary | ICD-10-CM | POA: Diagnosis not present

## 2022-01-16 DIAGNOSIS — G43009 Migraine without aura, not intractable, without status migrainosus: Secondary | ICD-10-CM | POA: Diagnosis not present

## 2022-01-16 DIAGNOSIS — N939 Abnormal uterine and vaginal bleeding, unspecified: Secondary | ICD-10-CM | POA: Diagnosis not present

## 2022-01-16 DIAGNOSIS — E1165 Type 2 diabetes mellitus with hyperglycemia: Secondary | ICD-10-CM | POA: Diagnosis not present

## 2022-01-16 DIAGNOSIS — E039 Hypothyroidism, unspecified: Secondary | ICD-10-CM | POA: Diagnosis not present

## 2022-01-16 NOTE — Telephone Encounter (Signed)
Received message from RN CM asking that I outreach patient for a phone appointment for medication review. Called patient, scheduled for Friday morning.  Catie Hedwig Morton, PharmD, [facility] [phone]

## 2022-01-18 ENCOUNTER — Other Ambulatory Visit: Payer: Self-pay | Admitting: Pharmacist

## 2022-01-18 DIAGNOSIS — G43009 Migraine without aura, not intractable, without status migrainosus: Secondary | ICD-10-CM

## 2022-01-18 DIAGNOSIS — Z794 Long term (current) use of insulin: Secondary | ICD-10-CM

## 2022-01-18 NOTE — Chronic Care Management (AMB) (Signed)
Chief Complaint  Patient presents with   High Risk Managed Medicaid    Melinda [name] is a 56 y.o. year old female who presented for a telephone visit.   They were referred to the pharmacist by their High Risk Managed Medicaid Care Team  for assistance in managing complex medication management.   Patient is participating in a Managed Medicaid Plan:  Yes  Subjective:  Care Team: Primary Care Provider: reports she has recently changed from Dr. Wynetta Emery to [name], [facility] [place]; next scheduled visit: patient is unsure Neurology: [name]; Next Scheduled Visit: 01/06/23 Ortho: [name]; Next Scheduled Visit: 01/24/22  Medication Access/Adherence  Current Pharmacy:  [place], [place] - [address] [place] [address] Phone: [phone] Fax: [phone]   Patient reports affordability concerns with their medications: No  Patient reports access/transportation concerns to their pharmacy: No  Patient reports adherence concerns with their medications:  No  - utilizes adherence packaging  Depression/Anxiety with Migraines: Current medications:  - Depression/anxiety; Vraylar 1.5 mg daily, bupropion XL 300 mg daily, buspirone 10 mg twice daily, doxepin 150 mg QPM, prazosin 6 mg QPM - Migraines: topiramate 200 mg QPM for prevention, takes APAP for treatment. Consideration for CGRP, but she had been stable at the time of last visit. Since then, periods have restarted and has migraines every day of her period - Pain: gabapentin 300 mg three times daily   Reports 10 headache days in the past month, coinciding with menstrual cycle  Reports sedation throughout the day, wonders if any medications are contributing  Asthma:  Current medications: Symbicort 160/4.5 mcg 2 puffs twice daily- though generally only taking once daily; albuterol HFA PRN; loratidine 10 mg daily, montelukast 10 mg daily  Reports her breathing is  generally well controlled when she stays inside, but does report more concerns when she goes outside.   Diabetes:  Current medications: Ozempic 0.5 mg weekly  Recently increased to 0.5 mg weekly. Denies GI upset, nausea,.   Hypertension:  Current medications: metoprolol tartrate 50 mg twice daily, furosemide 20 mg daily  Patient has a validated, automated, upper arm home BP cuff Current blood pressure readings readings: reports readings are generally well controlled, did have a higher reading a few days ago when having a migraine - 140s/110s  Hyperlipidemia/ASCVD Risk Reduction  Current lipid lowering medications: atorvastatin 20 mg daily  Hx PE: - Current medications: Eliquis 5 mg twice daily   Hypothyroidism: - Current medications: Synthroid 200 mcg daily  Health Maintenance  Health Maintenance Due  Topic Date Due   HEMOGLOBIN A1C  10/19/2021     Objective: Lab Results  Component Value Date   HGBA1C 6.1 04/20/2021    Lab Results  Component Value Date   CREATININE 1.19 (H) 07/17/2021   BUN 9 07/17/2021   NA 141 07/17/2021   K 3.2 (L) 07/17/2021   CL 107 07/17/2021   CO2 24 07/17/2021    Lab Results  Component Value Date   CHOL 130 04/20/2020   HDL 44 04/20/2020   LDLCALC 68 04/20/2020   TRIG 95 04/20/2020   CHOLHDL 3.0 04/20/2020    Medications Reviewed Today     Reviewed by Osker Mason, RPH-CPP (Pharmacist) on 01/18/22 at [phone]  Med List Status: <None>   Medication Order Taking? Sig Documenting Provider Last Dose Status Informant  acetaminophen (TYLENOL) 500 MG tablet [id number] Yes Take 1 tablet (500 mg total) by mouth every 6 (six)  hours as needed. Jessy Oto, MD Taking Active   albuterol (PROVENTIL) (2.5 MG/3ML) 0.083% nebulizer solution [id number] Yes Take 3 mLs (2.5 mg total) by nebulization every 6 (six) hours as needed for wheezing or shortness of breath. [name], MD Taking Active Self  albuterol (VENTOLIN HFA) 108 (90  Base) MCG/ACT inhaler [id number] Yes Inhale 2 puffs into the lungs every 6 (six) hours as needed for wheezing or shortness of breath. [name], MD Taking Active Self  atorvastatin (LIPITOR) 20 MG tablet [id number] Yes TAKE ONE TABLET BY MOUTH ONCE DAILY AT BEDTIME [name], MD Taking Active   budesonide-formoterol [facility]) 160-4.5 MCG/ACT inhaler [id number] Yes Inhale 2 puffs into the lungs 2 (two) times daily. [name], MD Taking Active   buPROPion (WELLBUTRIN XL) 300 MG 24 hr tablet [id number] Yes Take 300 mg by mouth daily.  [provider] Taking Active Self  busPIRone (BUSPAR) 10 MG tablet [id number] Yes Take 10 mg by mouth 2 (two) times daily. [provider] Taking Active Self           Med Note [name], [facility]   Wed Feb 18, 2018 10:03 PM)    Cholecalciferol (VITAMIN D3) 10 MCG (400 UNIT) CAPS [id number] Yes Take 400 Int'l Units/day by mouth daily. [name], MD Taking Active   cyclobenzaprine (FLEXERIL) 10 MG tablet [id number] Yes TAKE 1 TABLET (10 MG TOTAL) BY MOUTH 3 (THREE) TIMES DAILY AS NEEDED FOR MUSCLE SPASMS. [name], MD Taking Active   doxepin [employer]) 50 MG capsule [id number] Yes Take 150 mg by mouth at bedtime. [provider] Taking Active Self  ELIQUIS 5 MG TABS tablet [id number] Yes TAKE ONE TABLET BY MOUTH TWICE DAILY MORNING AND EVENING [name], MD Taking Active   furosemide (LASIX) 20 MG tablet [id number] Yes Take 1 tablet (20 mg total) by mouth daily. Stop HCTZ (Hydrochlorothiazide) [name], MD Taking Active   gabapentin (NEURONTIN) 300 MG capsule [id number] Yes TAKE ONE CAPSULE BY MOUTH THREE TIMES DAILY (AM+NOON+BEDTIME) [name], MD Taking Active   loratadine (CLARITIN) 10 MG tablet [id number] Yes Take 1 tablet (10 mg total) by mouth daily. [name], MD Taking Active   metoprolol tartrate (LOPRESSOR) 50 MG tablet [id number] Yes TAKE ONE TABLET BY MOUTH  TWICE DAILY (AM+BEDTIME) [name], MD Taking Active   montelukast (SINGULAIR) 10 MG tablet [id number] Yes TAKE 1 TABLET BY MOUTH EVERY EVENING AT BEDTIME [name], MD Taking Active   prazosin (MINIPRESS) 2 MG capsule [id number] Yes Take 6 mg by mouth at bedtime. [provider] Taking Active Self  Semaglutide,0.25 or 0.'5MG'$ /DOS, (OZEMPIC, 0.25 OR 0.5 MG/DOSE,) 2 MG/1.5ML SOPN [id number] Yes Inject 0.5 mg into the skin once a week. [provider] Taking Active   SYNTHROID 200 MCG tablet [id number] Yes Take 1 tablet (200 mcg total) by mouth daily before breakfast. [name], MD Taking Active   topiramate (TOPAMAX) 200 MG tablet [id number] Yes TAKE 1 TABLET (200 MG TOTAL) BY MOUTH AT BEDTIME. [name], DO Taking Active   VRAYLAR 1.5 MG capsule [id number] Yes Take 1.5 mg by mouth daily. [provider] Taking Active               Assessment/Plan:    Depression/Anxiety with Migraines: - Mood moderately well controlled, but reports worsening of migraines this month  - Will outreach Dr. [name] to discuss. Patient is interested in trying new agents for either  prevention or treatment.  - Discussed that gabapentin could be contributing to sedation. Contacted patient's new PCP, [name], left message suggesting switching from gabapentin to pregabalin. Will await call back.   Asthma: - Uncontrolled if she goes outside. Discussed benefit of increasing Symbicort to twice daily, especially in times she anticipates being outside. Discussed maintenance vs rescue therapy.  - Continue current regimen at this time  Diabetes: - Controlled per patient report. Continue current regimen at this time  Hypertension: - Controlled at goal <130/80 generally - Encouraged to continue current regimen at this time, along with home BP checks  Hyperlipidemia/ASCVD Risk Reduction - Controlled; recommended to continue current regimen at this time  Hx  PE: - Controlled; recommended to continue current regimen at this time  Hypothyroidism: - Controlled; recommended to continue current regimen at this time  Follow Up Plan: phone call in ~ 6 weeks  Catie Hedwig Morton, PharmD, [place] [phone]

## 2022-01-18 NOTE — Patient Instructions (Signed)
Ms. Cronk,   It was great talking to you today!  I called [place] office and left a message asking about switching from gabapentin to pregabalin - this medication works similarly, but often can cause less sedation.   I've messaged Dr. [name] about your migraine days this month. I'll let you know when I hear back.   I will ask Terri about the healthcare notebook to help you keep track of your health, medications, and appointments.   Let me know if you have any questions or concerns!  Catie Hedwig Morton, PharmD, [facility] [phone]

## 2022-01-20 ENCOUNTER — Telehealth: Payer: Self-pay | Admitting: Neurology

## 2022-01-20 NOTE — Telephone Encounter (Signed)
Patient has started experiencing increased migraines has had 10 in past 30 days.  I would like to go ahead and start Aimovig '140mg'$  every 28 days.  Medications tried:  topiramate, beta blocker, amitriptyline

## 2022-01-21 ENCOUNTER — Ambulatory Visit (INDEPENDENT_AMBULATORY_CARE_PROVIDER_SITE_OTHER): Payer: Medicaid Other | Admitting: Surgery

## 2022-01-21 ENCOUNTER — Encounter: Payer: Self-pay | Admitting: Surgery

## 2022-01-21 ENCOUNTER — Other Ambulatory Visit: Payer: Self-pay

## 2022-01-21 ENCOUNTER — Telehealth: Payer: Self-pay | Admitting: Neurology

## 2022-01-21 VITALS — BP 104/73 | HR 96 | Temp 98.4°F | Ht 70.0 in | Wt 342.0 lb

## 2022-01-21 DIAGNOSIS — E890 Postprocedural hypothyroidism: Secondary | ICD-10-CM | POA: Insufficient documentation

## 2022-01-21 DIAGNOSIS — Z7901 Long term (current) use of anticoagulants: Secondary | ICD-10-CM | POA: Insufficient documentation

## 2022-01-21 DIAGNOSIS — D1779 Benign lipomatous neoplasm of other sites: Secondary | ICD-10-CM | POA: Diagnosis not present

## 2022-01-21 DIAGNOSIS — Z86718 Personal history of other venous thrombosis and embolism: Secondary | ICD-10-CM | POA: Insufficient documentation

## 2022-01-21 DIAGNOSIS — G43009 Migraine without aura, not intractable, without status migrainosus: Secondary | ICD-10-CM | POA: Insufficient documentation

## 2022-01-21 DIAGNOSIS — R1319 Other dysphagia: Secondary | ICD-10-CM | POA: Insufficient documentation

## 2022-01-21 DIAGNOSIS — E1165 Type 2 diabetes mellitus with hyperglycemia: Secondary | ICD-10-CM | POA: Insufficient documentation

## 2022-01-21 DIAGNOSIS — Z85850 Personal history of malignant neoplasm of thyroid: Secondary | ICD-10-CM | POA: Insufficient documentation

## 2022-01-21 NOTE — Telephone Encounter (Signed)
LMOVm to call the office back.

## 2022-01-21 NOTE — Patient Instructions (Signed)
Our office will call you late September to schedule a follow up appointment for late October. If you do not hear from our office please call.

## 2022-01-21 NOTE — Telephone Encounter (Signed)
Patient was returning a call

## 2022-01-22 ENCOUNTER — Telehealth (HOSPITAL_COMMUNITY): Payer: Self-pay | Admitting: Pharmacy Technician

## 2022-01-22 ENCOUNTER — Other Ambulatory Visit (HOSPITAL_COMMUNITY): Payer: Self-pay

## 2022-01-22 DIAGNOSIS — E119 Type 2 diabetes mellitus without complications: Secondary | ICD-10-CM | POA: Diagnosis not present

## 2022-01-22 DIAGNOSIS — E890 Postprocedural hypothyroidism: Secondary | ICD-10-CM | POA: Diagnosis not present

## 2022-01-22 DIAGNOSIS — E278 Other specified disorders of adrenal gland: Secondary | ICD-10-CM | POA: Diagnosis not present

## 2022-01-22 DIAGNOSIS — Z8639 Personal history of other endocrine, nutritional and metabolic disease: Secondary | ICD-10-CM | POA: Diagnosis not present

## 2022-01-22 DIAGNOSIS — Z85850 Personal history of malignant neoplasm of thyroid: Secondary | ICD-10-CM | POA: Diagnosis not present

## 2022-01-22 MED ORDER — AIMOVIG 140 MG/ML ~~LOC~~ SOAJ: 140.0000 mg | SUBCUTANEOUS | 2 refills | Status: DC

## 2022-01-22 NOTE — Telephone Encounter (Signed)
Pt called informed that Dr [name] would like to start Aimovig 140 mg and that we have started the PA

## 2022-01-22 NOTE — Telephone Encounter (Signed)
Patient Advocate Encounter   Received notification that prior authorization for Aimovig '140MG'$ /ML auto-injectors is required.   PA submitted on 01/22/2022 Key [id number] Status is pending       Lyndel Safe, [place] Patient Advocate Specialist [place] Patient Advocate Team Direct Number: [phone]  Fax: [phone]

## 2022-01-22 NOTE — Telephone Encounter (Signed)
-----   Message from Wilder Glade, LPN sent at 02/27/5620  8:12 AM EDT ----- Regarding: PA needed I'm not sure if this has been started yet,   Patient has started experiencing increased migraines has had 10 in past 30 days.  I would like to go ahead and start Aimovig '140mg'$  every 28 days.  Medications tried:  topiramate, beta blocker, amitriptyline

## 2022-01-22 NOTE — Telephone Encounter (Signed)
Pt called an informed that Prior Authorization for Aimovig '140MG'$ /ML auto-injectors has been approved.

## 2022-01-22 NOTE — Telephone Encounter (Signed)
Patient Advocate Encounter  Prior Authorization for Aimovig '140MG'$ /ML auto-injectors has been approved.    PA# [id number] Effective dates: 01/22/2022 through 04/24/2022     Lyndel Safe, [place] Patient Advocate Specialist [place] Patient Advocate Team Direct Number: [phone]  Fax: [phone]

## 2022-01-22 NOTE — Telephone Encounter (Signed)
See other phone note

## 2022-01-22 NOTE — Addendum Note (Signed)
Addended by: Jake Seats on: 01/22/2022 08:17 AM   Modules accepted: Orders

## 2022-01-23 DIAGNOSIS — F4325 Adjustment disorder with mixed disturbance of emotions and conduct: Secondary | ICD-10-CM | POA: Diagnosis not present

## 2022-01-23 NOTE — Progress Notes (Signed)
Outpatient Surgical Follow Up  01/23/2022  Melinda [name] is an 56 y.o. female.   Chief Complaint  Patient presents with   Follow-up    Adrenal mass    HPI: Following up for adrenal myolipoma.  Please note that she has had hemorrhage into the right adrenal mass and is in full anticoagulation.  She does have a history of diabetes is seen Dr. Buddy Duty, recently started on Ozempic and has lost about 6 pounds. She denies any fevers, chills she endorses.  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  Some nonspecific abdominal pain.  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 ([place]) AGE 67   HAD TO LEARN TO WALK AGAIN   Iron deficiency anemia due to chronic blood loss 09/09/2014   Low TSH level 09/08/2014   Lumbar herniated disc    Menorrhagia 09/08/2014   Migraine    CLUSTER AND MIGRAINES   Multiple thyroid nodules    Ovarian mass, right 09/09/2014   PE (pulmonary embolism) 2016   Peripheral neuropathy    FINGER TIPS AND TOES NUMB SOME   Preeclampsia 1983   WITH PREGNANCY   PTSD (post-traumatic stress disorder)    Scoliosis    Seizures ([place]) AGE 89   NONE SINCE, HAD CHICKEN POX THEN    Past Surgical History:  Procedure Laterality Date   ANGIOGRAM TO LEG  08-13-15   RIGHT   CHOLECYSTECTOMY     IR GENERIC HISTORICAL  04/04/2016   IR RADIOLOGIST EVAL & MGMT 04/04/2016 [name], MD [facility] INTERV RAD    RADIOLOGY WITH ANESTHESIA N/A 07/17/2021   Procedure: MRI LUMBAR SPINE WITHOUT CONTRAST; MRI CERVICAL SPINE WITHOUT CONTRAST WITH ANESTHESIA;  Surgeon: Radiologist, Medication, MD;  Location: [facility];  Service: Radiology;  Laterality: N/A;   THYROIDECTOMY N/A 11/17/2015   Procedure: TOTAL THYROIDECTOMY;  Surgeon: Armandina Gemma, MD;  Location[facility];  Service: General;  Laterality: N/A;   UTERINE ARTERY EMBOLIZATION Bilateral 08/13/2015    Family History  Problem Relation Age of Onset   Diabetes Mother    Breast cancer Mother    CAD Mother    Hypertension Mother    Alcohol abuse Father    Hypertension Father    Breast cancer Maternal Aunt    Breast cancer Maternal Aunt    Kidney nephrosis Son    Diabetes Son     Social History:  reports that she quit smoking about 7 years ago. Her smoking use included cigarettes. She has a 20.00 pack-year smoking history. She has been exposed to tobacco smoke. She has never used smokeless tobacco. She reports that she does not drink alcohol and does not use drugs.  Allergies:  Allergies  Allergen Reactions   Ceftriaxone Anaphylaxis and Other (See Comments)    ROCEPHIN   Metformin Anaphylaxis  ALL   Penicillins Shortness Of Breath    Has patient had a PCN reaction causing immediate rash, facial/tongue/throat swelling, SOB or lightheadedness with hypotension: Yes Has patient had a PCN reaction causing severe rash involving mucus membranes or skin necrosis: No Has patient had a PCN reaction that required hospitalization No Has patient had a PCN reaction occurring within the last 10 years: No If all of the above answers are "NO", then may proceed with Cephalosporin use.    Shellfish Allergy Anaphylaxis   Flonase [Fluticasone Propionate]     Makes migraines worse   Gold-Containing Drug Products     HANDS ITCH   Metformin Hcl Other (See Comments)   Nickel     HANDS SWELL   Penicillin G Other (See Comments)   Citrus Rash    Medications  reviewed.    ROS Full ROS performed and is otherwise negative other than what is stated in HPI   BP 104/73   Pulse 96   Temp 98.4 F (36.9 C) (Oral)   Ht '5\' 10"'$  (1.778 m)   Wt (!) 342 lb (155.1 kg)   SpO2 96%   BMI 49.07 kg/m   Physical Exam CONSTITUTIONAL: NAD BMI 49, uses a walker. EYES: Pupils are equal, round,  Sclera are non-icteric. EARS, NOSE, MOUTH AND THROAT: The oropharynx is clear. The oral mucosa is pink and moist. Hearing is intact to voice. LYMPH NODES:  Lymph nodes in the neck are normal. RESPIRATORY:  Lungs are clear. There is normal respiratory effort, with equal breath sounds bilaterally, and without pathologic use of accessory muscles. CARDIOVASCULAR: Heart is regular without murmurs, gallops, or rubs. GI: The abdomen is  soft, reducible umbilical incisional hernia. There are no palpable masses. There is no hepatosplenomegaly. There are normal bowel sounds in all quadrants. GU: Rectal deferred.   MUSCULOSKELETAL: Normal muscle strength and tone. No cyanosis or edema.   SKIN: Turgor is good and there are no pathologic skin lesions or ulcers. NEUROLOGIC: Motor and sensation is grossly normal. Cranial nerves are grossly intact. PSYCH:  Oriented to person, place and time. Affect is normal.    Assessment/Plan: 1. Myelolipoma of right adrenal gland enlarging with prior history of bleeding into the mass and currently on anticoagulation.  She is diabetic and has a BMI of 49.  I do think there is significant opportunity to improve her physiology specifically weight reduction.  She is more compliant with meds now I will hope to see her back in about 3 months at that time may revisit excision of adrenal mass.  Discussed with patient detail.  Please note that I spent 30 minutes in visit including coordination of care personally reviewing, placing orders and performing appropriate   Caroleen Hamman, MD [place] Surgeon

## 2022-01-24 ENCOUNTER — Ambulatory Visit: Payer: Medicaid Other | Admitting: Specialist

## 2022-01-24 ENCOUNTER — Encounter: Payer: Self-pay | Admitting: Specialist

## 2022-01-24 ENCOUNTER — Ambulatory Visit (INDEPENDENT_AMBULATORY_CARE_PROVIDER_SITE_OTHER): Payer: Medicaid Other

## 2022-01-24 VITALS — BP 128/83 | HR 92 | Ht 70.0 in | Wt 342.0 lb

## 2022-01-24 DIAGNOSIS — M79675 Pain in left toe(s): Secondary | ICD-10-CM | POA: Diagnosis not present

## 2022-01-24 DIAGNOSIS — M4726 Other spondylosis with radiculopathy, lumbar region: Secondary | ICD-10-CM

## 2022-01-24 DIAGNOSIS — M5416 Radiculopathy, lumbar region: Secondary | ICD-10-CM | POA: Diagnosis not present

## 2022-01-24 DIAGNOSIS — M4802 Spinal stenosis, cervical region: Secondary | ICD-10-CM | POA: Diagnosis not present

## 2022-01-24 DIAGNOSIS — Z6841 Body Mass Index (BMI) 40.0 and over, adult: Secondary | ICD-10-CM

## 2022-01-24 DIAGNOSIS — M5136 Other intervertebral disc degeneration, lumbar region: Secondary | ICD-10-CM

## 2022-01-24 DIAGNOSIS — S90111A Contusion of right great toe without damage to nail, initial encounter: Secondary | ICD-10-CM

## 2022-01-24 NOTE — Progress Notes (Signed)
Office Visit Note   Patient: Melinda [name]           Date of Birth: Oct 27, 1965           MRN: [id number] Visit Date: 01/24/2022              Requested by: [name], MD [address] [place],  [place] [address] PCP: [name], MD   Assessment & Plan: Visit Diagnoses:  1. Other spondylosis with radiculopathy, lumbar region   2. Pain of left great toe   3. Body mass index 50.0-59.9, adult ([place])   4. Lumbar radiculopathy   5. Spinal stenosis of cervical region   6. Degenerative disc disease, lumbar   7. Contusion of right great toe without damage to nail, initial encounter     Plan: Walk flat footed with hard sole shoe. Avoid overhead lifting and overhead use of the arms. Do not lift greater than 5 lbs. Adjust head rest in vehicle to prevent hyperextension if rear ended. Take extra precautions to avoid falling, including use of a cane if you feel weak. Avoid frequent bending and stooping  No lifting greater than 10 lbs. May use ice or moist heat for pain. Weight loss is of benefit. Best medication for lumbar disc disease is arthritis medications like motrin, celebrex and naprosyn. Exercise is important to improve your indurance and does allow people to function better inspite of back pain.    Follow-Up Instructions: No follow-ups on file.   Orders:  Orders Placed This Encounter  Procedures   XR Toe Great Left   No orders of the defined types were placed in this encounter.     Procedures: No procedures performed   Clinical Data: No additional findings.   Subjective: Chief Complaint  Patient presents with   Left Foot - Follow-up   Lower Back - Follow-up    56 year old female with history of right leg DVT now on eliquis. She has been experiencing pain into the left calf and left thigh up to the left hip. She is having constant pain Especially with going to make a move to stand or even to turn to the side. Has less urinary frequency  and she reports this is even with taking in a fair amount of liquids. Bowel movements are less frequent. Cough or sneeze is painful. She has difficulty with ambulating any distance due to being out of breath, less than 10-15 feet and she has atrial fibrillation. Left great toe is painful. The left leg  Is not cold but more like it is hot or burning sensation.     Review of Systems  Constitutional:  Positive for activity change, fatigue and unexpected weight change.  HENT:  Positive for congestion, rhinorrhea and sinus pressure.   Respiratory:  Positive for cough, shortness of breath and wheezing. Negative for apnea and chest tightness.   Cardiovascular:  Positive for chest pain, palpitations and leg swelling.  Gastrointestinal:  Positive for abdominal pain and constipation.  Endocrine: Negative.   Genitourinary:  Positive for difficulty urinating.  Musculoskeletal:  Positive for back pain and gait problem.  Skin: Negative.   Neurological:  Positive for dizziness, weakness and numbness.  Psychiatric/Behavioral: Negative.       Objective: Vital Signs: BP 128/83   Pulse 92   Ht '5\' 10"'$  (1.778 m)   Wt (!) 342 lb (155.1 kg)   BMI 49.07 kg/m   Physical Exam Constitutional:      Appearance:  She is well-developed.  HENT:     Head: Normocephalic and atraumatic.  Eyes:     Pupils: Pupils are equal, round, and reactive to light.  Pulmonary:     Effort: Pulmonary effort is normal.     Breath sounds: Normal breath sounds.  Abdominal:     General: Bowel sounds are normal.     Palpations: Abdomen is soft.  Musculoskeletal:     Cervical back: Normal range of motion and neck supple.     Lumbar back: Negative right straight leg raise test and negative left straight leg raise test.  Skin:    General: Skin is warm and dry.  Neurological:     Mental Status: She is alert and oriented to person, place, and time.  Psychiatric:        Behavior: Behavior normal.        Thought Content:  Thought content normal.        Judgment: Judgment normal.    Back Exam   Tenderness  The patient is experiencing tenderness in the lumbar.  Range of Motion  Extension:  abnormal  Flexion:  abnormal  Lateral bend right:  abnormal  Lateral bend left:  abnormal  Rotation right:  abnormal  Rotation left:  abnormal   Tests  Straight leg raise right: negative Straight leg raise left: negative  Other  Toe walk: normal Heel walk: normal Sensation: normal     Specialty Comments:  No specialty comments available.  Imaging: No results found.   PMFS History: Patient Active Problem List   Diagnosis Date Noted   Postoperative hypothyroidism 01/21/2022   Esophageal dysphagia 01/21/2022   History of DVT (deep vein thrombosis) 01/21/2022   History of malignant neoplasm of thyroid 01/21/2022   Long term (current) use of anticoagulants 01/21/2022   Migraine without aura, not refractory 01/21/2022   Type 2 diabetes mellitus with hyperglycemia ([place]) 01/21/2022   Spinal stenosis excluding cervical region 11/21/2021   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   Abdominal pannus 08/21/2021   Hardening of the aorta (main artery of the heart) ([place]) 08/21/2021   At high risk for falls 04/20/2020   Hyperlipidemia 02/16/2020   Adrenal mass, right ([place]) 04/29/2019   Chronic cough 03/12/2018   Left shoulder pain 10/14/2016   Primary hypertension 10/14/2016   S/P thyroidectomy 06/27/2016   Chronic bilateral low back pain without sciatica 04/22/2016   Liver lesion 12/24/2015   Malignant tumor of thyroid gland ([place]) 11/16/2015   Controlled type 2 diabetes mellitus with complication, with long-term current use of insulin ([place]) 10/10/2015   Vision loss 08/25/2015   Decreased vision in both eyes 08/25/2015   Prolonged Q-T interval on ECG 08/19/2015   History of pulmonary embolism 08/10/2015   Mild persistent asthma 04/13/2015   Nonruptured cerebral aneurysm 04/13/2015   Fibroid,  uterine    Morbid obesity ([place]) 11/11/2014   Atypical chest pain 11/08/2014   Sinus tachycardia 11/08/2014   Migraine 10/14/2014   Menorrhagia 09/08/2014   Past Medical History:  Diagnosis Date   ADHD (attention deficit hyperactivity disorder)    Arthritis    Asthma    Cancer ([place])    Thyroid   Chicken pox AGE 25   COPD (chronic obstructive pulmonary disease) ([place])    pt reported   Diabetes mellitus without complication ([place])    Diverticulosis, sigmoid    GERD (gastroesophageal reflux disease)    Glaucoma    BOTH EYES, NO EYE DROPS   Glaucoma    History  of blood transfusion 08/2014    2UNITS GIVEN AND IRON GIVEN   Hyperglycemia 09/09/2014   Hypertension    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 ([place]) AGE 43   HAD TO LEARN TO WALK AGAIN   Iron deficiency anemia due to chronic blood loss 09/09/2014   Low TSH level 09/08/2014   Lumbar herniated disc    Menorrhagia 09/08/2014   Migraine    CLUSTER AND MIGRAINES   Multiple thyroid nodules    Ovarian mass, right 09/09/2014   PE (pulmonary embolism) 2016   Peripheral neuropathy    FINGER TIPS AND TOES NUMB SOME   Preeclampsia 1983   WITH PREGNANCY   PTSD (post-traumatic stress disorder)    Scoliosis    Seizures ([place]) AGE 59   NONE SINCE, HAD CHICKEN POX THEN    Family History  Problem Relation Age of Onset   Diabetes Mother    Breast cancer Mother    CAD Mother    Hypertension Mother    Alcohol abuse Father    Hypertension Father    Breast cancer Maternal Aunt    Breast cancer Maternal Aunt    Kidney nephrosis Son    Diabetes Son     Past Surgical History:  Procedure Laterality Date   ANGIOGRAM TO LEG  08-13-15   RIGHT   CHOLECYSTECTOMY     IR GENERIC HISTORICAL  04/04/2016   IR RADIOLOGIST EVAL & MGMT 04/04/2016 [name], MD [facility] INTERV RAD   RADIOLOGY WITH ANESTHESIA N/A 07/17/2021   Procedure: MRI LUMBAR SPINE WITHOUT CONTRAST; MRI CERVICAL SPINE WITHOUT CONTRAST WITH  ANESTHESIA;  Surgeon: Radiologist, Medication, MD;  Location: [facility];  Service: Radiology;  Laterality: N/A;   THYROIDECTOMY N/A 11/17/2015   Procedure: TOTAL THYROIDECTOMY;  Surgeon: Armandina Gemma, MD;  Location[facility];  Service: General;  Laterality: N/A;   UTERINE ARTERY EMBOLIZATION Bilateral 08/13/2015   Social History   Occupational History   Occupation: disabled  Tobacco Use   Smoking status: Former    Packs/day: 0.50    Years: 40.00    Total pack years: 20.00    Types: Cigarettes    Quit date: 11/03/2014    Years since quitting: 7.3    Passive exposure: Past   Smokeless tobacco: Never  Vaping Use   Vaping Use: Never used  Substance and Sexual Activity   Alcohol use: No    Alcohol/week: 0.0 standard drinks of alcohol   Drug use: No   Sexual activity: Never    Birth control/protection: None

## 2022-01-29 DIAGNOSIS — N898 Other specified noninflammatory disorders of vagina: Secondary | ICD-10-CM | POA: Diagnosis not present

## 2022-01-29 DIAGNOSIS — N939 Abnormal uterine and vaginal bleeding, unspecified: Secondary | ICD-10-CM | POA: Diagnosis not present

## 2022-02-01 ENCOUNTER — Emergency Department (HOSPITAL_COMMUNITY)
Admission: EM | Admit: 2022-02-01 | Discharge: 2022-02-01 | Disposition: A | Discharge status: Home / Self Care | Payer: Medicaid Other | Attending: Emergency Medicine | Admitting: Emergency Medicine

## 2022-02-01 ENCOUNTER — Emergency Department (HOSPITAL_COMMUNITY): Payer: Medicaid Other

## 2022-02-01 ENCOUNTER — Other Ambulatory Visit: Payer: Self-pay

## 2022-02-01 ENCOUNTER — Encounter (HOSPITAL_COMMUNITY): Payer: Self-pay

## 2022-02-01 DIAGNOSIS — Z87891 Personal history of nicotine dependence: Secondary | ICD-10-CM | POA: Diagnosis not present

## 2022-02-01 DIAGNOSIS — Z85850 Personal history of malignant neoplasm of thyroid: Secondary | ICD-10-CM | POA: Diagnosis not present

## 2022-02-01 DIAGNOSIS — J449 Chronic obstructive pulmonary disease, unspecified: Secondary | ICD-10-CM | POA: Diagnosis not present

## 2022-02-01 DIAGNOSIS — J45909 Unspecified asthma, uncomplicated: Secondary | ICD-10-CM | POA: Insufficient documentation

## 2022-02-01 DIAGNOSIS — R079 Chest pain, unspecified: Secondary | ICD-10-CM | POA: Diagnosis not present

## 2022-02-01 DIAGNOSIS — R1084 Generalized abdominal pain: Secondary | ICD-10-CM | POA: Diagnosis not present

## 2022-02-01 DIAGNOSIS — R109 Unspecified abdominal pain: Secondary | ICD-10-CM | POA: Diagnosis not present

## 2022-02-01 DIAGNOSIS — I10 Essential (primary) hypertension: Secondary | ICD-10-CM | POA: Insufficient documentation

## 2022-02-01 DIAGNOSIS — E119 Type 2 diabetes mellitus without complications: Secondary | ICD-10-CM | POA: Insufficient documentation

## 2022-02-01 DIAGNOSIS — N12 Tubulo-interstitial nephritis, not specified as acute or chronic: Secondary | ICD-10-CM | POA: Diagnosis not present

## 2022-02-01 DIAGNOSIS — R1030 Lower abdominal pain, unspecified: Secondary | ICD-10-CM | POA: Diagnosis not present

## 2022-02-01 LAB — COMPREHENSIVE METABOLIC PANEL
ALT: 31 U/L (ref 0–44)
AST: 21 U/L (ref 15–41)
Albumin: 3.5 g/dL (ref 3.5–5.0)
Alkaline Phosphatase: 74 U/L (ref 38–126)
Anion gap: 5 (ref 5–15)
BUN: 11 mg/dL (ref 6–20)
CO2: 23 mmol/L (ref 22–32)
Calcium: 9 mg/dL (ref 8.9–10.3)
Chloride: 113 mmol/L — ABNORMAL HIGH (ref 98–111)
Creatinine, Ser: 0.86 mg/dL (ref 0.44–1.00)
GFR, Estimated: >60 mL/min (ref >60)
Glucose, Bld: 142 mg/dL — ABNORMAL HIGH (ref 70–99)
Potassium: 3.6 mmol/L (ref 3.5–5.1)
Sodium: 141 mmol/L (ref 135–145)
Total Bilirubin: 0.5 mg/dL (ref 0.3–1.2)
Total Protein: 6.6 g/dL (ref 6.5–8.1)

## 2022-02-01 LAB — URINALYSIS, ROUTINE W REFLEX MICROSCOPIC
Bilirubin Urine: NEGATIVE
Glucose, UA: NEGATIVE mg/dL
Ketones, ur: NEGATIVE mg/dL
Nitrite: NEGATIVE
Protein, ur: NEGATIVE mg/dL
Specific Gravity, Urine: 1.03 (ref 1.005–1.030)
pH: 6 (ref 5.0–8.0)

## 2022-02-01 LAB — CBC
HCT: 40.5 % (ref 36.0–46.0)
Hemoglobin: 13.3 g/dL (ref 12.0–15.0)
MCH: 30.2 pg (ref 26.0–34.0)
MCHC: 32.8 g/dL (ref 30.0–36.0)
MCV: 92 fL (ref 80.0–100.0)
Platelets: 222 10*3/uL (ref 150–400)
RBC: 4.4 MIL/uL (ref 3.87–5.11)
RDW: 13.1 % (ref 11.5–15.5)
WBC: 10.2 10*3/uL (ref 4.0–10.5)
nRBC: 0 % (ref 0.0–0.2)

## 2022-02-01 LAB — LIPASE, BLOOD: Lipase: 23 U/L (ref 11–51)

## 2022-02-01 LAB — TROPONIN I (HIGH SENSITIVITY): Troponin I (High Sensitivity): 7 ng/L (ref <18)

## 2022-02-01 MED ORDER — CIPROFLOXACIN IN D5W 400 MG/200ML IV SOLN
400.0000 mg | Freq: Once | INTRAVENOUS | Status: AC
  Administered 2022-02-01: 400 mg via INTRAVENOUS
  Filled 2022-02-01: qty 200

## 2022-02-01 MED ORDER — KETOROLAC TROMETHAMINE 15 MG/ML IJ SOLN
15.0000 mg | Freq: Once | INTRAMUSCULAR | Status: AC
  Administered 2022-02-01: 15 mg via INTRAVENOUS
  Filled 2022-02-01: qty 1

## 2022-02-01 MED ORDER — OXYCODONE HCL 5 MG PO TABS: 5.0000 mg | ORAL_TABLET | ORAL | 0 refills | Status: DC | PRN

## 2022-02-01 MED ORDER — SODIUM CHLORIDE 0.9 % IV BOLUS
500.0000 mL | Freq: Once | INTRAVENOUS | Status: AC
  Administered 2022-02-01: 500 mL via INTRAVENOUS

## 2022-02-01 MED ORDER — CIPROFLOXACIN HCL 500 MG PO TABS: 500.0000 mg | ORAL_TABLET | Freq: Two times a day (BID) | ORAL | 0 refills | Status: DC

## 2022-02-01 MED ORDER — IOHEXOL 300 MG/ML  SOLN
100.0000 mL | Freq: Once | INTRAMUSCULAR | Status: AC | PRN
  Administered 2022-02-01: 100 mL via INTRAVENOUS

## 2022-02-01 MED ORDER — SODIUM CHLORIDE (PF) 0.9 % IJ SOLN
INTRAMUSCULAR | Status: AC
  Filled 2022-02-01: qty 50

## 2022-02-01 MED ORDER — HYDROMORPHONE HCL 1 MG/ML IJ SOLN
0.5000 mg | Freq: Once | INTRAMUSCULAR | Status: AC
  Administered 2022-02-01: 0.5 mg via INTRAVENOUS
  Filled 2022-02-01: qty 1

## 2022-02-01 MED ORDER — ONDANSETRON HCL 4 MG/2ML IJ SOLN
4.0000 mg | Freq: Once | INTRAMUSCULAR | Status: AC
  Administered 2022-02-01: 4 mg via INTRAVENOUS
  Filled 2022-02-01: qty 2

## 2022-02-01 NOTE — ED Provider Notes (Signed)
[facility] Emergency Department Provider Note MRN:  [id number]  Arrival date & time: 02/01/22     Chief Complaint   Abdominal Pain   History of Present Illness   Melinda [name] is a 56 y.o. year-old female with a history of diabetes presenting to the ED with chief complaint of abdominal pain.  Left flank pain with radiation into the left chest, left lower quadrant.  Present since 1 AM, sudden onset.  Has never happened before.  Associated with nausea, no vomiting, no constipation, no diarrhea, no fever.  Review of Systems  A thorough review of systems was obtained and all systems are negative except as noted in the HPI and PMH.   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5   HAD TO LEARN TO WALK AGAIN   Iron deficiency anemia due to chronic blood loss 09/09/2014   Low TSH level 09/08/2014   Lumbar herniated disc    Menorrhagia 09/08/2014   Migraine    CLUSTER AND MIGRAINES   Multiple thyroid nodules    Ovarian mass, right 09/09/2014   PE (pulmonary embolism) 2016   Peripheral neuropathy    FINGER TIPS AND TOES NUMB SOME   Preeclampsia 1983   WITH PREGNANCY   PTSD (post-traumatic stress disorder)    Scoliosis    Seizures ([place]) AGE 41   NONE SINCE, HAD CHICKEN POX THEN    Past Surgical History:  Procedure Laterality Date   ANGIOGRAM TO LEG  08-13-15   RIGHT    CHOLECYSTECTOMY     IR GENERIC HISTORICAL  04/04/2016   IR RADIOLOGIST EVAL & MGMT 04/04/2016 [name], MD [facility] INTERV RAD   RADIOLOGY WITH ANESTHESIA N/A 07/17/2021   Procedure: MRI LUMBAR SPINE WITHOUT CONTRAST; MRI CERVICAL SPINE WITHOUT CONTRAST WITH ANESTHESIA;  Surgeon: Radiologist, Medication, MD;  Location: [facility];  Service: Radiology;  Laterality: N/A;   THYROIDECTOMY N/A 11/17/2015   Procedure: TOTAL THYROIDECTOMY;  Surgeon: Armandina Gemma, MD;  Location: [facility];  Service: General;  Laterality: N/A;   UTERINE ARTERY EMBOLIZATION Bilateral 08/13/2015    Family History  Problem Relation Age of Onset   Diabetes Mother    Breast cancer Mother    CAD Mother    Hypertension Mother    Alcohol abuse Father    Hypertension Father    Breast cancer Maternal Aunt    Breast cancer Maternal Aunt    Kidney nephrosis Son    Diabetes Son     Social History   Socioeconomic History   Marital status: Single    Spouse name: Not on file   Number of children: 1   Years of education: 13   Highest education level: Some college, no degree  Occupational History   Occupation: disabled  Tobacco Use   Smoking status: Former  Packs/day: 0.50    Years: 40.00    Total pack years: 20.00    Types: Cigarettes    Quit date: 11/03/2014    Years since quitting: 7.2    Passive exposure: Past   Smokeless tobacco: Never  Vaping Use   Vaping Use: Never used  Substance and Sexual Activity   Alcohol use: No    Alcohol/week: 0.0 standard drinks of alcohol   Drug use: No   Sexual activity: Never    Birth control/protection: None  Other Topics Concern   Not on file  Social History Narrative   Patient is right-handed. She lives in a one level handicap accessible apartment. She avoids caffeine. She uses stationary stationary pedals to use for exercise.   Social Determinants of Health   Financial Resource Strain: Low Risk  (10/01/2021)   Overall Financial Resource Strain (CARDIA)    Difficulty of Paying  Living Expenses: Not very hard  Food Insecurity: No Food Insecurity (01/08/2022)   Hunger Vital Sign    Worried About Running Out of Food in the Last Year: Never true    Ran Out of Food in the Last Year: Never true  Transportation Needs: No Transportation Needs (01/08/2022)   PRAPARE - Hydrologist (Medical): No    Lack of Transportation (Non-Medical): No  Physical Activity: Inactive (01/15/2022)   Exercise Vital Sign    Days of Exercise per Week: 0 days    Minutes of Exercise per Session: 0 min  Stress: Stress Concern Present (12/07/2021)   [place]    Feeling of Stress : To some extent  Social Connections: Socially Isolated (10/01/2021)   Social Connection and Isolation Panel [NHANES]    Frequency of Communication with Friends and Family: More than three times a week    Frequency of Social Gatherings with Friends and Family: More than three times a week    Attends Religious Services: Never    Marine scientist or Organizations: No    Attends Archivist Meetings: Never    Marital Status: Never married  Intimate Partner Violence: Not At Risk (08/31/2021)   Humiliation, Afraid, Rape, and Kick questionnaire    Fear of Current or Ex-Partner: No    Emotionally Abused: No    Physically Abused: No    Sexually Abused: No     Physical Exam   Vitals:   02/01/22 0314 02/01/22 0540  BP: (!) 144/94 127/80  Pulse: 88 99  Resp: 17 19  Temp: 98.1 F (36.7 C)   SpO2: 98% 95%    CONSTITUTIONAL: Well-appearing, NAD NEURO/PSYCH:  Alert and oriented x 3, no focal deficits EYES:  eyes equal and reactive ENT/NECK:  no LAD, no JVD CARDIO: Regular rate, well-perfused, normal S1 and S2 PULM:  CTAB no wheezing or rhonchi GI/GU:  non-distended, non-tender MSK/SPINE:  No gross deformities, no edema SKIN:  no rash, atraumatic   *Additional and/or pertinent findings included in MDM  below  Diagnostic and Interventional Summary    EKG Interpretation  Date/Time:  Friday February 01 2022 05:08:36 EDT Ventricular Rate:  93 PR Interval:  175 QRS Duration: 82 QT Interval:  415 QTC Calculation: 517 R Axis:   15 Text Interpretation: Sinus rhythm Low voltage, precordial leads Abnormal R-wave progression, early transition Borderline T abnormalities, anterior leads Prolonged QT interval Confirmed by Gerlene Fee [phone]) on 02/01/2022 6:10:20 AM       Labs Reviewed  COMPREHENSIVE METABOLIC  PANEL - Abnormal; Notable for the following components:      Result Value   Chloride 113 (*)    Glucose, Bld 142 (*)    All other components within normal limits  CBC  LIPASE, BLOOD  URINALYSIS, ROUTINE W REFLEX MICROSCOPIC  TROPONIN I (HIGH SENSITIVITY)  TROPONIN I (HIGH SENSITIVITY)    CT ABDOMEN PELVIS W CONTRAST  Final Result    DG Chest Port 1 View  Final Result      Medications  ciprofloxacin (CIPRO) IVPB 400 mg (has no administration in time range)  ketorolac (TORADOL) 15 MG/ML injection 15 mg (has no administration in time range)  sodium chloride 0.9 % bolus 500 mL (500 mLs Intravenous Bolus 02/01/22 0508)  ondansetron (ZOFRAN) injection 4 mg (4 mg Intravenous Given 02/01/22 0503)  HYDROmorphone (DILAUDID) injection 0.5 mg (0.5 mg Intravenous Given 02/01/22 0503)  sodium chloride (PF) 0.9 % injection (  Given by Other 02/01/22 0643)  iohexol (OMNIPAQUE) 300 MG/ML solution 100 mL (100 mLs Intravenous Contrast Given 02/01/22 0607)     Procedures  /  Critical Care Procedures  ED Course and Medical Decision Making  Initial Impression and Ddx Differential diagnosis includes kidney stone, diverticulitis, ACS, pneumothorax, gastritis, awaiting labs, CT, chest x-ray.  Past medical/surgical history that increases complexity of ED encounter: Diabetes, COPD  Interpretation of Diagnostics I personally reviewed the laboratory assessment and my interpretation is as follows: No  significant blood count or electrolyte disturbance, troponin negative x2.  CT revealing signs of possible pyelonephritis, awaiting urinalysis  Patient Reassessment and Ultimate Disposition/Management     Signed out to oncoming provider, anticipating discharge.  Patient management required discussion with the following services or consulting groups:  None  Complexity of Problems Addressed Acute illness or injury that poses threat of life of bodily function  Additional Data Reviewed and Analyzed Further history obtained from: None  Additional Factors Impacting ED Encounter Risk Prescriptions  Barth Kirks. Sedonia Small, MD [place] [email or web address]  Final Clinical Impressions(s) / ED Diagnoses     ICD-10-CM   1. Pyelonephritis  N12       ED Discharge Orders          Ordered    oxyCODONE (ROXICODONE) 5 MG immediate release tablet  Every 4 hours PRN        02/01/22 0644    ciprofloxacin (CIPRO) 500 MG tablet  Every 12 hours        02/01/22 0644             Discharge Instructions Discussed with and Provided to Patient:     Discharge Instructions      You were evaluated in the Emergency Department and after careful evaluation, we did not find any emergent condition requiring admission or further testing in the hospital.  Your exam/testing today is overall reassuring.  Symptoms may be due to a kidney infection.  Take the ciprofloxacin antibiotic as directed.  Recommend Tylenol 1000 mg every 4-6 hours and/or Motrin 600 mg every 4-6 hours for pain.  You can use the oxycodone medication for more significant pain.  Please return to the Emergency Department if you experience any worsening of your condition.   Thank you for allowing [place] to be a part of your care.       Maudie Flakes, MD 02/01/22 [phone]

## 2022-02-01 NOTE — ED Triage Notes (Signed)
Pt BIB EMS from home for left side abdominal pain that's radiating to her flank. Pt also has a hernia  138/82 76 hr 97% 136 cbg

## 2022-02-01 NOTE — Discharge Instructions (Addendum)
You were evaluated in the Emergency Department and after careful evaluation, we did not find any emergent condition requiring admission or further testing in the hospital.  Your exam/testing today is overall reassuring.  Symptoms may be due to a kidney infection.  Take the ciprofloxacin antibiotic as directed.  Recommend Tylenol 1000 mg every 4-6 hours and/or Motrin 600 mg every 4-6 hours for pain.  You can use the oxycodone medication for more significant pain.  Please return to the Emergency Department if you experience any worsening of your condition.   Thank you for allowing [place] to be a part of your care.

## 2022-02-01 NOTE — ED Provider Notes (Signed)
  Physical Exam  BP 111/77   Pulse 99   Temp 97.8 F (36.6 C) (Oral)   Resp 20   Ht '5\' 10"'$  (1.778 m)   Wt (!) 155.1 kg   SpO2 94%   BMI 49.07 kg/m   Physical Exam Vitals and nursing note reviewed.  Constitutional:      General: She is not in acute distress.    Appearance: She is well-developed.  HENT:     Head: Normocephalic and atraumatic.  Eyes:     Conjunctiva/sclera: Conjunctivae normal.  Cardiovascular:     Rate and Rhythm: Normal rate and regular rhythm.     Heart sounds: No murmur heard. Pulmonary:     Effort: Pulmonary effort is normal. No respiratory distress.     Breath sounds: Normal breath sounds.  Abdominal:     Palpations: Abdomen is soft.     Tenderness: There is no abdominal tenderness. There is left CVA tenderness.  Musculoskeletal:        General: No swelling.     Cervical back: Neck supple.  Skin:    General: Skin is warm and dry.     Capillary Refill: Capillary refill takes less than 2 seconds.  Neurological:     Mental Status: She is alert.  Psychiatric:        Mood and Affect: Mood normal.     Procedures  Procedures  ED Course / MDM   Clinical Course as of 02/01/22 0818  Fri Feb 01, 2022  0704 Pending urine [MK]    Clinical Course User Index [MK] Delance Weide, Debe Coder, MD   Medical Decision Making Amount and/or Complexity of Data Reviewed Labs: ordered. Radiology: ordered. ECG/medicine tests: ordered.  Risk Prescription drug management.   Patient received in handoff.  CT concerning for pyelonephritis.  Patient pending urinalysis and urine culture.  Regardless of urinalysis results, patient to be discharged on antibiotics.  Additional antibiotic therapy to be driven by culture.  Urinalysis with mild hematuria but no obvious infection.  Patient then discharged according to the plan of the previous provider.       Teressa Lower, MD 02/01/22 [phone]

## 2022-02-02 LAB — URINE CULTURE

## 2022-02-04 ENCOUNTER — Other Ambulatory Visit: Payer: Self-pay | Admitting: Internal Medicine

## 2022-02-04 DIAGNOSIS — S46011D Strain of muscle(s) and tendon(s) of the rotator cuff of right shoulder, subsequent encounter: Secondary | ICD-10-CM

## 2022-02-05 NOTE — Telephone Encounter (Signed)
Requested medication (s) are due for refill today - yes  Requested medication (s) are on the active medication list -yes  Future visit scheduled -yes  Last refill: 12/04/21 #30 1RF  Notes to clinic: non delegated Rx  Requested Prescriptions  Pending Prescriptions Disp Refills   cyclobenzaprine (FLEXERIL) 10 MG tablet [Pharmacy Med Name: CYCLOBENZAPRINE HCL 10 MG ORAL TABLET] 30 tablet 1    Sig: TAKE 1 TABLET (10 MG TOTAL) BY MOUTH 3 (THREE) TIMES DAILY AS NEEDED FOR MUSCLE SPASMS.     Not Delegated - Analgesics:  Muscle Relaxants Failed - 02/04/2022 12:29 PM      Failed - This refill cannot be delegated      Passed - Valid encounter within last 6 months    Recent Outpatient Visits           1 month ago Type 2 diabetes mellitus with morbid obesity ([place])   [place] [name] B, MD   5 months ago Type 2 diabetes mellitus with morbid obesity [facility])   [place] [name], MD   8 months ago Bulging lumbar disc   [place] Hickman, Marialy M, [name]   9 months ago Type 2 diabetes mellitus with morbid obesity [facility])   [place] [name], MD   11 months ago Poor sleep   [place], MD       Future Appointments             In 2 months Wynetta Emery Dalbert Batman, MD [place]               Requested Prescriptions  Pending Prescriptions Disp Refills   cyclobenzaprine (FLEXERIL) 10 MG tablet [Pharmacy Med Name: CYCLOBENZAPRINE HCL 10 MG ORAL TABLET] 30 tablet 1    Sig: TAKE 1 TABLET (10 MG TOTAL) BY MOUTH 3 (THREE) TIMES DAILY AS NEEDED FOR MUSCLE SPASMS.     Not Delegated - Analgesics:  Muscle Relaxants Failed - 02/04/2022 12:29 PM      Failed - This refill cannot be delegated      Passed - Valid encounter within last 6 months    Recent Outpatient Visits            1 month ago Type 2 diabetes mellitus with morbid obesity ([place])   [place] [name] B, MD   5 months ago Type 2 diabetes mellitus with morbid obesity [facility])   [place] [name], MD   8 months ago Bulging lumbar disc   [place], Vidya M, [name]   9 months ago Type 2 diabetes mellitus with morbid obesity [facility])   [place], MD   11 months ago Poor sleep   [place], MD       Future Appointments             In 2 months Wynetta Emery Dalbert Batman, MD [place]

## 2022-02-06 DIAGNOSIS — N12 Tubulo-interstitial nephritis, not specified as acute or chronic: Secondary | ICD-10-CM | POA: Diagnosis not present

## 2022-02-06 DIAGNOSIS — F4325 Adjustment disorder with mixed disturbance of emotions and conduct: Secondary | ICD-10-CM | POA: Diagnosis not present

## 2022-02-14 DIAGNOSIS — N3090 Cystitis, unspecified without hematuria: Secondary | ICD-10-CM | POA: Diagnosis not present

## 2022-02-15 ENCOUNTER — Other Ambulatory Visit: Payer: Self-pay | Admitting: Obstetrics and Gynecology

## 2022-02-15 NOTE — Patient Outreach (Signed)
Medicaid Managed Care   Nurse Care Manager Note  02/15/2022 Name:  Melinda [name] MRN:  [id number] DOB:  [birth year]  Melinda [name] is an 56 y.o. year old female who is a primary patient of [place], [place], [place].  [facility] Managed Care Coordination team was consulted for assistance with:    Chronic healthcare management needs, DM, HTN, chronic pain, migraines, anxiety/depression, asthma, glaucoma.  Ms. Linehan was given information about Medicaid Managed Care Coordination team services today. Melinda [name] Patient agreed to services and verbal consent obtained.  Engaged with patient by telephone for follow up visit in response to provider referral for case management and/or care coordination services.   Assessments/Interventions:  Review of past medical history, allergies, medications, health status, including review of consultants reports, laboratory and other test data, was performed as part of comprehensive evaluation and provision of chronic care management services.  SDOH (Social Determinants of Health) assessments and interventions performed:  Care Plan  Allergies  Allergen Reactions   Ceftriaxone Anaphylaxis and Other (See Comments)    ROCEPHIN   Metformin Anaphylaxis    ALL   Penicillins Shortness Of Breath    Has patient had a PCN reaction causing immediate rash, facial/tongue/throat swelling, SOB or lightheadedness with hypotension: Yes Has patient had a PCN reaction causing severe rash involving mucus membranes or skin necrosis: No Has patient had a PCN reaction that required hospitalization No Has patient had a PCN reaction occurring within the last 10 years: No If all of the above answers are "NO", then [name] proceed with Cephalosporin use.    Shellfish Allergy Anaphylaxis   Flonase [Fluticasone Propionate]     Makes migraines worse   Gold-Containing Drug Products     HANDS ITCH   Metformin Hcl Other (See Comments)   Nickel     HANDS SWELL   Penicillin G Other (See  Comments)   Citrus Rash   Medications Reviewed Today     Reviewed by Gayla Medicus, RN (Registered Nurse) on 02/15/22 at [place] List Status: <None>   Medication Order Taking? Sig Documenting Provider Last Dose Status Informant  acetaminophen (TYLENOL) 500 MG tablet [id number]  Take 1 tablet (500 mg total) by mouth every 6 (six) hours as needed. Jessy Oto, MD  Active   albuterol (PROVENTIL) (2.5 MG/3ML) 0.083% nebulizer solution [id number]  Take 3 mLs (2.5 mg total) by nebulization every 6 (six) hours as needed for wheezing or shortness of breath. [name], MD  Active Self  albuterol (VENTOLIN HFA) 108 (90 Base) MCG/ACT inhaler [id number]  Inhale 2 puffs into the lungs every 6 (six) hours as needed for wheezing or shortness of breath. [name], MD  Active Self  atorvastatin (LIPITOR) 20 MG tablet [id number]  TAKE ONE TABLET BY MOUTH ONCE DAILY AT BEDTIME [name], MD  Active   budesonide-formoterol [facility]) 160-4.5 MCG/ACT inhaler [id number]  Inhale 2 puffs into the lungs 2 (two) times daily. [name], MD  Active   buPROPion (WELLBUTRIN XL) 300 MG 24 hr tablet [id number]  Take 300 mg by mouth daily.  [provider]  Active Self  busPIRone (BUSPAR) 10 MG tablet [id number]  Take 10 mg by mouth 2 (two) times daily. [provider]  Active Self           Med Note [name], [facility] I   Wed Feb 18, 2018 10:03 PM)    Cholecalciferol (VITAMIN D3) 10 MCG (400 UNIT) CAPS [id number]  Take 400 Int'l  Units/day by mouth daily. [name], MD  Active   ciprofloxacin (CIPRO) 500 MG tablet [id number] No Take 1 tablet (500 mg total) by mouth every 12 (twelve) hours.  Patient not taking: Reported on 02/15/2022   Maudie Flakes, MD Not Taking Active   cyclobenzaprine (FLEXERIL) 10 MG tablet [id number]  TAKE 1 TABLET (10 MG TOTAL) BY MOUTH 3 (THREE) TIMES DAILY AS NEEDED FOR MUSCLE SPASMS. Charlott Rakes, MD  Active   doxepin (SINEQUAN) 50 MG  capsule [id number]  Take 150 mg by mouth at bedtime. [provider]  Active Self  ELIQUIS 5 MG TABS tablet [id number]  TAKE ONE TABLET BY MOUTH TWICE DAILY MORNING AND EVENING [name], MD  Active   Erenumab-aooe (AIMOVIG) 140 MG/ML Darden Palmer [id number]  Inject 140 mg into the skin every 28 (twenty-eight) days. [name], DO  Active   furosemide (LASIX) 20 MG tablet [id number]  Take 1 tablet (20 mg total) by mouth daily. Stop HCTZ (Hydrochlorothiazide) [name], MD  Active   gabapentin (NEURONTIN) 300 MG capsule [id number]  TAKE ONE CAPSULE BY MOUTH THREE TIMES DAILY (AM+NOON+BEDTIME) [name], MD  Active   loratadine (CLARITIN) 10 MG tablet [id number]  Take 1 tablet (10 mg total) by mouth daily. [name], MD  Active   metoprolol tartrate (LOPRESSOR) 50 MG tablet [id number]  TAKE ONE TABLET BY MOUTH TWICE DAILY (AM+BEDTIME) [name], MD  Active   montelukast (SINGULAIR) 10 MG tablet [id number]  TAKE 1 TABLET BY MOUTH EVERY EVENING AT BEDTIME [name], MD  Active   oxyCODONE (ROXICODONE) 5 MG immediate release tablet [id number]  Take 1 tablet (5 mg total) by mouth every 4 (four) hours as needed for severe pain. Maudie Flakes, MD  Active   prazosin (MINIPRESS) 2 MG capsule [id number]  Take 6 mg by mouth at bedtime. [provider]  Active Self  Semaglutide,0.25 or 0.'5MG'$ /DOS, (OZEMPIC, 0.25 OR 0.5 MG/DOSE,) 2 MG/1.5ML SOPN [id number]  Inject 0.5 mg into the skin once a week. [provider]  Active   SYNTHROID 200 MCG tablet [id number]  Take 1 tablet (200 mcg total) by mouth daily before breakfast. [name], MD  Active   topiramate (TOPAMAX) 200 MG tablet [id number]  TAKE 1 TABLET (200 MG TOTAL) BY MOUTH AT BEDTIME. [name], DO  Active   VRAYLAR 1.5 MG capsule [id number]  Take 1.5 mg by mouth daily. [provider]  Active            Patient Active Problem List   Diagnosis Date Noted   Postoperative  hypothyroidism 01/21/2022   Esophageal dysphagia 01/21/2022   History of DVT (deep vein thrombosis) 01/21/2022   History of malignant neoplasm of thyroid 01/21/2022   Long term (current) use of anticoagulants 01/21/2022   Migraine without aura, not refractory 01/21/2022   Type 2 diabetes mellitus with hyperglycemia ([place]) 01/21/2022   Spinal stenosis excluding cervical region 11/21/2021   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   Abdominal pannus 08/21/2021   Hardening of the aorta (main artery of the heart) ([place]) 08/21/2021   At high risk for falls 04/20/2020   Hyperlipidemia 02/16/2020   Adrenal mass, right ([place]) 04/29/2019   Chronic cough 03/12/2018   Left shoulder pain 10/14/2016   Primary hypertension 10/14/2016   S/P thyroidectomy 06/27/2016   Chronic bilateral low back pain without sciatica 04/22/2016   Liver lesion 12/24/2015   Malignant tumor of thyroid gland ([place]) 11/16/2015  Controlled type 2 diabetes mellitus with complication, with long-term current use of insulin ([place]) 10/10/2015   Vision loss 08/25/2015   Decreased vision in both eyes 08/25/2015   Prolonged Q-T interval on ECG 08/19/2015   History of pulmonary embolism 08/10/2015   Mild persistent asthma 04/13/2015   Nonruptured cerebral aneurysm 04/13/2015   Fibroid, uterine    Morbid obesity ([place]) 11/11/2014   Atypical chest pain 11/08/2014   Sinus tachycardia 11/08/2014   Migraine 10/14/2014   Menorrhagia 09/08/2014   Conditions to be addressed/monitored per PCP order:  Chronic healthcare management needs, DM, HTN, chronic pain, migraines, anxiety/depression, asthma, glaucoma.  Care Plan : General Plan of Care (Adult)  Updates made by Gayla Medicus, RN since 02/15/2022 12:00 AM     Problem: Health Promotion or Disease Self-Management (General Plan of Care)   Priority: Medium  Onset Date: 10/20/2020     Long-Range Goal: Self-Management Plan Developed   Start Date: 07/14/2020  Expected End Date:  05/18/2022  Recent Progress: On track  Priority: Medium  Note:    Current Barriers:  Chronic Disease Management support and education needs Patient with anxiety and depression-has appointment with Psychiatrist  scheduled every 3 months, therapist every 2 weeks 02/15/22:  patient with cold today-taking OTC meds and using inhaler, nebulizer as needed.  BP 117/80 today, has lost 40 pounds.  Has not checked blood sugars as she cannot locate her [name] will check in her apartment.  Pain is currently managed.  Patient with AUB and to have [place] 02/28/22 at GYN office. Patient will have surgery on right adrenal gland to remove myelolipoma after losing more weight inadditon to hernia and spinal surgery, Nurse Case Manager Clinical Goal(s):  Over the next 90 days, patient will attend all scheduled medical appointments:  Over the next 30 days, patient will work with CM team pharmacist to review medications. Interventions:  Inter-disciplinary care team collaboration (see longitudinal plan of care) Evaluation of current treatment plan  and patient's adherence to plan as established by provider. Reviewed medications with patient. Collaborated with pharmacy regarding medications. Discussed plans with patient for ongoing care management follow up and provided patient with direct contact information for care management team Reviewed scheduled/upcoming provider appointments. Collaborated with SW for psychotherapy referral. SW referral for psychotherapy provider-completed Pharmacy referral for medication review-completed Collaborated with PCP for new BP cuff Self Care Activities: Over the next 90 days, patient will:  -Self administers medications as prescribed Attends all scheduled provider appointments Calls pharmacy for medication refills Calls provider office for new concerns or questions Patient will locate meter to check blood sugars  Follow Up Plan: The Managed Medicaid care management team will  reach out to the patient again over the next 30  business days.  The patient has been provided with contact information for the Managed Medicaid care management team and has been advised to call with any health related questions or concerns.    Follow Up:  Patient agrees to Care Plan and Follow-up.  Plan: The Managed Medicaid care management team will reach out to the patient again over the next 30 business  days. and The  Patient has been provided with contact information for the Managed Medicaid care management team and has been advised to call with any health related questions or concerns.  Date/time of next scheduled RN care management/care coordination outreach:  03/27/22 at 230.

## 2022-02-15 NOTE — Patient Instructions (Signed)
Hi Melinda [name], thanks for speaking with me today, I hope you feel better.  Melinda [name] was given information about Medicaid Managed Care team care coordination services as a part of their [place] Medicaid benefit. Melinda [name] verbally consented to engagement with the [facility] Managed Care team.   If you are experiencing a medical emergency, please call 911 or report to your local emergency department or urgent care.   If you have a non-emergency medical problem during routine business hours, please contact your provider's office and ask to speak with a nurse.   For questions related to your [facility], please call: [phone] or visit the homepage here: [email or web address]  If you would like to schedule transportation through your [name], please call the following number at least 2 days in advance of your appointment: [phone]   Rides for urgent appointments can also be made after hours by calling Member Services.  Call the [name] at [phone], at any time, 24 hours a day, 7 days a week. If you are in danger or need immediate medical attention call 911.  If you would like help to quit smoking, call 1-800-QUIT-NOW 651-759-9539) OR Espaol: 1-855-Djelo-Ya (9-449-675-9163) o para ms informacin haga clic aqu or Text READY to 200-400 to register via text  Melinda [name] - following are the goals we discussed in your visit today:   Goals Addressed             This Visit's Progress    Protect My Health         Timeframe:  Long-Range Goal Priority:  Medium Start Date:    07/14/20                         Expected End Date:  ongoing          Follow Up Date: 03/27/22 - schedule appointment for vaccines needed due to my age or health - schedule recommended health tests (blood work, mammogram, colonoscopy, pap test) - schedule and  keep appointment for annual check-up   02/15/22:  Patient up to date on appointments, recent appts with GYN, PCP, ORTHO, surgery   Patient verbalizes understanding of instructions and care plan provided today and agrees to view in [name]. Active MyChart status and patient understanding of how to access instructions and care plan via MyChart confirmed with patient.     The Managed Medicaid care management team will reach out to the patient again over the next 30 business  days.  The  Patient  has been provided with contact information for the Managed Medicaid care management team and has been advised to call with any health related questions or concerns.   Melinda Raider RN, BSN [place] Management Coordinator - Managed Medicaid High Risk [phone]   Following is a copy of your plan of care:  Care Plan : General Plan of Care (Adult)  Updates made by Gayla Medicus, RN since 02/15/2022 12:00 AM     Problem: Health Promotion or Disease Self-Management (General Plan of Care)   Priority: Medium  Onset Date: 10/20/2020     Long-Range Goal: Self-Management Plan Developed   Start Date: 07/14/2020  Expected End Date: 05/18/2022  Recent Progress: On track  Priority: Medium  Note:    Current Barriers:  Chronic Disease Management support and education needs Patient with anxiety and depression-has appointment with Psychiatrist  scheduled  every 3 months, therapist every 2 weeks 02/15/22:  patient with cold today-taking OTC meds and using inhaler, nebulizer as needed.  BP 117/80 today, has lost 40 pounds.  Has not checked blood sugars as she cannot locate her [name] will check in her apartment.  Pain is currently managed.  Patient with AUB and to have [place] 02/28/22 at GYN office. Patient will have surgery on right adrenal gland to remove myelolipoma after losing more weight inadditon to hernia and spinal surgery, Nurse Case Manager Clinical Goal(s):  Over the next  90 days, patient will attend all scheduled medical appointments:  Over the next 30 days, patient will work with CM team pharmacist to review medications. Interventions:  Inter-disciplinary care team collaboration (see longitudinal plan of care) Evaluation of current treatment plan  and patient's adherence to plan as established by provider. Reviewed medications with patient. Collaborated with pharmacy regarding medications. Discussed plans with patient for ongoing care management follow up and provided patient with direct contact information for care management team Reviewed scheduled/upcoming provider appointments. Collaborated with SW for psychotherapy referral. SW referral for psychotherapy provider-completed Pharmacy referral for medication review-completed Collaborated with PCP for new BP cuff Self Care Activities: Over the next 90 days, patient will:  -Self administers medications as prescribed Attends all scheduled provider appointments Calls pharmacy for medication refills Calls provider office for new concerns or questions Patient will locate meter to check blood sugars  Follow Up Plan: The Managed Medicaid care management team will reach out to the patient again over the next 30  business days.  The patient has been provided with contact information for the Managed Medicaid care management team and has been advised to call with any health related questions or concerns.

## 2022-02-19 DIAGNOSIS — F4325 Adjustment disorder with mixed disturbance of emotions and conduct: Secondary | ICD-10-CM | POA: Diagnosis not present

## 2022-02-28 ENCOUNTER — Other Ambulatory Visit: Payer: Medicaid Other | Admitting: Pharmacist

## 2022-02-28 ENCOUNTER — Ambulatory Visit: Payer: Medicaid Other

## 2022-02-28 DIAGNOSIS — N950 Postmenopausal bleeding: Secondary | ICD-10-CM | POA: Diagnosis not present

## 2022-02-28 DIAGNOSIS — Z9889 Other specified postprocedural states: Secondary | ICD-10-CM | POA: Diagnosis not present

## 2022-02-28 NOTE — Progress Notes (Signed)
Chief Complaint  Patient presents with   Medication Management    Melinda [name] is a 56 y.o. year old female who presented for a telephone visit.   They were referred to the pharmacist by their High Risk Managed Medicaid Care Team  for assistance in managing complex medication management.   Patient is participating in a Managed Medicaid Plan:  Yes  Subjective:  Care Team: Primary Care Provider: Scheryl Marten, [place]   Medication Access/Adherence  Current Pharmacy:  [facility], [place] [address] Phone: [phone] Fax: [phone]   Patient reports affordability concerns with their medications: No  Patient reports access/transportation concerns to their pharmacy: No  Patient reports adherence concerns with their medications:  No     Depression/Anxiety with Migraines: Current medications:  - Depression/anxiety; Vraylar 1.5 mg daily, bupropion XL 300 mg daily, buspirone 10 mg twice daily, doxepin 150 mg QPM, prazosin 6 mg QPM - Migraines: Aimovig 140 mg every 4 weeks, topiramate 200 mg QPM for prevention, takes APAP for treatment.  - Pain: gabapentin 300 mg three times daily    Reports benefit since starting Aimovig.   Reports that she generally doesn't take her "morning" meds until lunch time, so she had been taking two gabapentin together at that time. She has since started taking once at lunch time and one at bedtime. Report less sedation in the afternoon and adequate pain control.    Asthma:   Current medications: Symbicort 160/4.5 mcg 2 puffs twice daily; albuterol HFA PRN; loratidine 10 mg daily, montelukast 10 mg daily     Diabetes:   Current medications: Ozempic 1 mg weekly   Recently increased to 1 mg weekly. Denies GI upset, nausea,.    Hypertension:   Current medications: metoprolol tartrate 50 mg twice daily, furosemide 20 mg daily     Hyperlipidemia/ASCVD Risk  Reduction   Current lipid lowering medications: atorvastatin 20 mg daily   Hx PE: - Current medications: Eliquis 5 mg twice daily     Hypothyroidism: - Current medications: Synthroid 200 mcg daily   Confirms she takes first thing in the morning before other medications  Health Maintenance  Health Maintenance Due  Topic Date Due   COVID-19 Vaccine (5 - Pfizer risk series) 05/13/2021   HEMOGLOBIN A1C  10/19/2021   INFLUENZA VACCINE  01/29/2022     Objective: Lab Results  Component Value Date   HGBA1C 6.1 04/20/2021    Lab Results  Component Value Date   CREATININE 0.86 02/01/2022   BUN 11 02/01/2022   NA 141 02/01/2022   K 3.6 02/01/2022   CL 113 (H) 02/01/2022   CO2 23 02/01/2022    Lab Results  Component Value Date   CHOL 130 04/20/2020   HDL 44 04/20/2020   LDLCALC 68 04/20/2020   TRIG 95 04/20/2020   CHOLHDL 3.0 04/20/2020    Medications Reviewed Today     Reviewed by Osker Mason, RPH-CPP (Pharmacist) on 02/28/22 at [place] List Status: <None>   Medication Order Taking? Sig Documenting Provider Last Dose Status Informant  acetaminophen (TYLENOL) 500 MG tablet [id number] Yes Take 1 tablet (500 mg total) by mouth every 6 (six) hours as needed. Jessy Oto, MD Taking Active   albuterol (PROVENTIL) (2.5 MG/3ML) 0.083% nebulizer solution [id number] Yes Take 3 mLs (2.5 mg total) by nebulization every 6 (six) hours as needed for wheezing or shortness of breath. [name],  MD Taking Active Self  albuterol (VENTOLIN HFA) 108 (90 Base) MCG/ACT inhaler [id number] Yes Inhale 2 puffs into the lungs every 6 (six) hours as needed for wheezing or shortness of breath. [name], MD Taking Active Self  atorvastatin (LIPITOR) 20 MG tablet [id number] Yes TAKE ONE TABLET BY MOUTH ONCE DAILY AT BEDTIME [name], MD Taking Active   budesonide-formoterol [facility]) 160-4.5 MCG/ACT inhaler [id number] Yes Inhale 2 puffs into the lungs 2 (two) times  daily. [name], MD Taking Active   buPROPion (WELLBUTRIN XL) 300 MG 24 hr tablet [id number] Yes Take 300 mg by mouth daily.  [provider] Taking Active Self  busPIRone (BUSPAR) 10 MG tablet [id number] Yes Take 10 mg by mouth 2 (two) times daily. [provider] Taking Active Self           Med Note [name], [facility] I   Wed Feb 18, 2018 10:03 PM)    Cholecalciferol (VITAMIN D3) 10 MCG (400 UNIT) CAPS [id number] Yes Take 400 Int'l Units/day by mouth daily. [name], MD Taking Active   cyclobenzaprine (FLEXERIL) 10 MG tablet [id number] Yes TAKE 1 TABLET (10 MG TOTAL) BY MOUTH 3 (THREE) TIMES DAILY AS NEEDED FOR MUSCLE SPASMS. Charlott Rakes, MD Taking Active   doxepin (SINEQUAN) 50 MG capsule [id number] Yes Take 150 mg by mouth at bedtime. [provider] Taking Active Self  ELIQUIS 5 MG TABS tablet [id number] Yes TAKE ONE TABLET BY MOUTH TWICE DAILY MORNING AND [name], MD Taking Active   Erenumab-aooe (AIMOVIG) 140 MG/ML Darden Palmer [id number] Yes Inject 140 mg into the skin every 28 (twenty-eight) days. [name], DO Taking Active   furosemide (LASIX) 20 MG tablet [id number] Yes Take 1 tablet (20 mg total) by mouth daily. Stop HCTZ (Hydrochlorothiazide) [name], MD Taking Active   gabapentin (NEURONTIN) 300 MG capsule [id number] Yes TAKE ONE CAPSULE BY MOUTH THREE TIMES DAILY (AM+NOON+BEDTIME) [name], MD Taking Active   loratadine (CLARITIN) 10 MG tablet [id number] Yes Take 1 tablet (10 mg total) by mouth daily. [name], MD Taking Active   metoprolol tartrate (LOPRESSOR) 50 MG tablet [id number] Yes TAKE ONE TABLET BY MOUTH TWICE DAILY (AM+BEDTIME) [name], MD Taking Active   montelukast (SINGULAIR) 10 MG tablet [id number] Yes TAKE 1 TABLET BY MOUTH EVERY EVENING AT BEDTIME [name], MD Taking Active   OZEMPIC, 1 MG/DOSE, 4 MG/3ML SOPN [id number] Yes Inject 1 mg into the skin once a week.  [provider] Taking Active   prazosin (MINIPRESS) 2 MG capsule [id number] Yes Take 6 mg by mouth at bedtime. [provider] Taking Active Self  SYNTHROID 200 MCG tablet [id number] Yes Take 1 tablet (200 mcg total) by mouth daily before breakfast. [name], MD Taking Active   topiramate (TOPAMAX) 200 MG tablet [id number] Yes TAKE 1 TABLET (200 MG TOTAL) BY MOUTH AT BEDTIME. [name], DO Taking Active   VRAYLAR 1.5 MG capsule [id number] Yes Take 1.5 mg by mouth daily. [provider] Taking Active               Assessment/Plan:   Depression/Anxiety with Migraines: - Improved and improvement in tolerability of regimen - Recommended to continue current regimen at this time   Asthma: - Controlled per patient report - Recommended to continue current regimen at this time   Diabetes: - Controlled per patient report.  - Continue current regimen at this time   Hypertension: -  Controlled  - Encouraged to continue current regimen at this time, along with home BP checks   Hyperlipidemia/ASCVD Risk Reduction - Controlled; recommended to continue current regimen at this time   Hx PE: - Controlled; recommended to continue current regimen at this time   Hypothyroidism: - Controlled; recommended to continue current regimen at this time  Follow Up Plan: follow up with RN CM as scheduled  Catie Hedwig Morton, PharmD, [place] [phone]

## 2022-02-28 NOTE — Patient Instructions (Signed)
Tamra,  It was great talking to you today!  Feel free to reach out with any future medication related questions or concerns.   Catie Hedwig Morton, PharmD, [facility] [phone]

## 2022-03-01 ENCOUNTER — Ambulatory Visit: Payer: Medicaid Other

## 2022-03-05 ENCOUNTER — Other Ambulatory Visit: Payer: Self-pay | Admitting: Internal Medicine

## 2022-03-05 DIAGNOSIS — J4550 Severe persistent asthma, uncomplicated: Secondary | ICD-10-CM

## 2022-03-05 DIAGNOSIS — F331 Major depressive disorder, recurrent, moderate: Secondary | ICD-10-CM | POA: Diagnosis not present

## 2022-03-05 DIAGNOSIS — F4310 Post-traumatic stress disorder, unspecified: Secondary | ICD-10-CM | POA: Diagnosis not present

## 2022-03-05 DIAGNOSIS — F4325 Adjustment disorder with mixed disturbance of emotions and conduct: Secondary | ICD-10-CM | POA: Diagnosis not present

## 2022-03-05 DIAGNOSIS — E118 Type 2 diabetes mellitus with unspecified complications: Secondary | ICD-10-CM

## 2022-03-05 DIAGNOSIS — I10 Essential (primary) hypertension: Secondary | ICD-10-CM

## 2022-03-05 DIAGNOSIS — Z86718 Personal history of other venous thrombosis and embolism: Secondary | ICD-10-CM

## 2022-03-05 DIAGNOSIS — F411 Generalized anxiety disorder: Secondary | ICD-10-CM | POA: Diagnosis not present

## 2022-03-19 DIAGNOSIS — F4325 Adjustment disorder with mixed disturbance of emotions and conduct: Secondary | ICD-10-CM | POA: Diagnosis not present

## 2022-03-20 ENCOUNTER — Other Ambulatory Visit: Payer: Self-pay | Admitting: Neurology

## 2022-03-25 DIAGNOSIS — Z85850 Personal history of malignant neoplasm of thyroid: Secondary | ICD-10-CM | POA: Diagnosis not present

## 2022-03-25 DIAGNOSIS — E890 Postprocedural hypothyroidism: Secondary | ICD-10-CM | POA: Diagnosis not present

## 2022-03-25 DIAGNOSIS — E278 Other specified disorders of adrenal gland: Secondary | ICD-10-CM | POA: Diagnosis not present

## 2022-03-25 DIAGNOSIS — E119 Type 2 diabetes mellitus without complications: Secondary | ICD-10-CM | POA: Diagnosis not present

## 2022-03-25 DIAGNOSIS — Z8639 Personal history of other endocrine, nutritional and metabolic disease: Secondary | ICD-10-CM | POA: Diagnosis not present

## 2022-03-25 NOTE — Patient Instructions (Signed)
Plan: Walk flat footed with hard sole shoe. Avoid overhead lifting and overhead use of the arms. Do not lift greater than 5 lbs. Adjust head rest in vehicle to prevent hyperextension if rear ended. Take extra precautions to avoid falling, including use of a cane if you feel weak. Avoid frequent bending and stooping  No lifting greater than 10 lbs. May use ice or moist heat for pain. Weight loss is of benefit. Best medication for lumbar disc disease is arthritis medications like motrin, celebrex and naprosyn. Exercise is important to improve your indurance and does allow people to function better inspite of back pain.

## 2022-03-27 ENCOUNTER — Other Ambulatory Visit: Payer: Self-pay | Admitting: Obstetrics and Gynecology

## 2022-03-27 NOTE — Patient Outreach (Signed)
Care Coordination  03/27/2022  [name] [birth year] [id number]   Medicaid Managed Care   Unsuccessful Outreach Note  03/27/2022 Name: Melinda [name] MRN: [id number] DOB: Aug 09, 1965  Referred by: [name], MD Reason for referral : High Risk Managed Medicaid (Unsuccessful telephone outreach)   An unsuccessful telephone outreach was attempted today. The patient was referred to the case management team for assistance with care management and care coordination.   Follow Up Plan: The care management team will reach out to the patient again over the next 30 business  days.  Aida Raider RN, BSN [place]  Triad Curator - Managed Medicaid High Risk [phone].

## 2022-03-27 NOTE — Patient Instructions (Signed)
HI Melinda [name], I hope you are doing okay-I am sorry I did not reach you today - as a part of your Medicaid benefit, you are eligible for care management and care coordination services at no cost or copay. I was unable to reach you by phone today but would be happy to help you with your health related needs. Please feel free to call me at [phone].  A member of the Managed Medicaid care management team will reach out to you again over the next 30 business days.   Aida Raider RN, BSN [place]  Triad Curator - Managed Medicaid High Risk [phone]

## 2022-04-03 ENCOUNTER — Telehealth: Payer: Self-pay | Admitting: Pharmacy Technician

## 2022-04-03 NOTE — Telephone Encounter (Signed)
Submitted a Prior Authorization request to [facility] for  Aimovig '140mg'$   via CoverMyMeds. Will update once we receive a response.   Key: [id number] - PA Case ID: [id number]

## 2022-04-04 ENCOUNTER — Other Ambulatory Visit: Payer: Self-pay | Admitting: Family Medicine

## 2022-04-04 ENCOUNTER — Other Ambulatory Visit: Payer: Self-pay | Admitting: Internal Medicine

## 2022-04-04 DIAGNOSIS — R600 Localized edema: Secondary | ICD-10-CM

## 2022-04-04 DIAGNOSIS — J4540 Moderate persistent asthma, uncomplicated: Secondary | ICD-10-CM

## 2022-04-04 DIAGNOSIS — S46011D Strain of muscle(s) and tendon(s) of the rotator cuff of right shoulder, subsequent encounter: Secondary | ICD-10-CM

## 2022-04-04 NOTE — Telephone Encounter (Signed)
Patient Advocate Encounter  Prior Authorization for Aimovig '140MG'$ /ML auto-injectors has been approved.    PA# [id number] Effective dates: 1004/2023 through 04/04/2023      Lyndel Safe, [place] Patient Advocate Specialist [place] Patient Advocate Team Direct Number: [phone]  Fax: [phone]

## 2022-04-15 ENCOUNTER — Other Ambulatory Visit: Payer: Self-pay | Admitting: Internal Medicine

## 2022-04-15 DIAGNOSIS — Z1231 Encounter for screening mammogram for malignant neoplasm of breast: Secondary | ICD-10-CM

## 2022-04-17 DIAGNOSIS — F4325 Adjustment disorder with mixed disturbance of emotions and conduct: Secondary | ICD-10-CM | POA: Diagnosis not present

## 2022-04-19 ENCOUNTER — Other Ambulatory Visit: Payer: Self-pay

## 2022-04-19 DIAGNOSIS — Z23 Encounter for immunization: Secondary | ICD-10-CM | POA: Diagnosis not present

## 2022-04-19 DIAGNOSIS — R1030 Lower abdominal pain, unspecified: Secondary | ICD-10-CM | POA: Diagnosis not present

## 2022-04-19 DIAGNOSIS — R21 Rash and other nonspecific skin eruption: Secondary | ICD-10-CM | POA: Diagnosis not present

## 2022-04-19 MED ORDER — COVID-19 MRNA 2023-2024 VACCINE (COMIRNATY) 0.3 ML INJECTION
0.3000 mL | Freq: Once | INTRAMUSCULAR | 0 refills | Status: AC
  Filled 2022-04-19: qty 0.3, 1d supply, fill #0

## 2022-04-25 ENCOUNTER — Ambulatory Visit: Payer: Medicaid Other | Admitting: Internal Medicine

## 2022-04-29 ENCOUNTER — Ambulatory Visit: Payer: Medicaid Other | Admitting: Surgery

## 2022-04-30 DIAGNOSIS — F4325 Adjustment disorder with mixed disturbance of emotions and conduct: Secondary | ICD-10-CM | POA: Diagnosis not present

## 2022-05-01 ENCOUNTER — Other Ambulatory Visit: Payer: Self-pay | Admitting: Obstetrics and Gynecology

## 2022-05-01 NOTE — Patient Instructions (Signed)
Hi Melinda [name], always nice to speak with you-Have a wonderful day!!  Melinda [name] was given information about Medicaid Managed Care team care coordination services as a part of their [place] Medicaid benefit. Melinda [name] verbally consented to engagement with the [facility] Managed Care team.   If you are experiencing a medical emergency, please call 911 or report to your local emergency department or urgent care.   If you have a non-emergency medical problem during routine business hours, please contact your provider's office and ask to speak with a nurse.   For questions related to your [facility], please call: [phone] or visit the homepage here: [email or web address]  If you would like to schedule transportation through your [facility], please call the following number at least 2 days in advance of your appointment: [phone]   Rides for urgent appointments can also be made after hours by calling Member Services.  Call the [place] at [phone], at any time, 24 hours a day, 7 days a week. If you are in danger or need immediate medical attention call 911.  If you would like help to quit smoking, call 1-800-QUIT-NOW (731)070-9858) OR Espaol: 1-855-Djelo-Ya (4-193-790-2409) o para ms informacin haga clic aqu or Text READY to 200-400 to register via text  Melinda [name] - following are the goals we discussed in your visit today:   Goals Addressed             This Visit's Progress    Protect My Health        Timeframe:  Long-Range Goal Priority:  Medium Start Date:    07/14/20                         Expected End Date:  ongoing          Follow Up Date: 06/03/22 - schedule appointment for vaccines needed due to my age or health - schedule recommended health tests (blood work, mammogram, colonoscopy, pap test) - schedule and keep  appointment for annual check-up   05/01/22:  Patient to schedule an appt with eye provider.   Patient verbalizes understanding of instructions and care plan provided today and agrees to view in [place]. Active MyChart status and patient understanding of how to access instructions and care plan via MyChart confirmed with patient.     The Managed Medicaid care management team will reach out to the patient again over the next 30 business  days.  The  Patient has been provided with contact information for the Managed Medicaid care management team and has been advised to call with any health related questions or concerns.   Aida Raider RN, BSN [place]  Triad Curator - Managed Medicaid High Risk [phone].   Following is a copy of your plan of care:  Care Plan : General Plan of Care (Adult)  Updates made by Gayla Medicus, RN since 05/01/2022 12:00 AM     Problem: Health Promotion or Disease Self-Management (General Plan of Care)   Priority: Medium  Onset Date: 10/20/2020     Long-Range Goal: Self-Management Plan Developed   Start Date: 07/14/2020  Expected End Date: 08/01/2022  Recent Progress: On track  Priority: Medium  Note:    Current Barriers:  Chronic Disease Management support and education needs Patient with anxiety and depression-has appointment with Psychiatrist  scheduled every 3 months, therapist every 2 weeks 05/01/22:  Patient continues to lose weight on Ozempic, 327 now.   Patient will have surgery on right adrenal gland to remove myelolipoma after losing more weight in additon to hernia and spinal surgery,  Blood sugars 120-130.  Patient to schedule a follow up appt with eye provider.  No complaints today, no AUB. Nurse Case Manager Clinical Goal(s):  Over the next 90 days, patient will attend all scheduled medical appointments:  Over the next 30 days, patient will work with CM team pharmacist to review  medications Interventions:  Inter-disciplinary care team collaboration (see longitudinal plan of care) Evaluation of current treatment plan  and patient's adherence to plan as established by provider. Reviewed medications with patient. Collaborated with pharmacy regarding medications. Discussed plans with patient for ongoing care management follow up and provided patient with direct contact information for care management team Reviewed scheduled/upcoming provider appointments. Collaborated with SW for psychotherapy referral. SW referral for psychotherapy provider-completed Pharmacy referral for medication review-completed Collaborated with PCP for new BP cuff Self Care Activities: Over the next 90 days, patient will:  -Self administers medications as prescribed Attends all scheduled provider appointments Calls pharmacy for medication refills Calls provider office for new concerns or questions Patient will locate meter to check blood sugars  Follow Up Plan: The Managed Medicaid care management team will reach out to the patient again over the next 30  business days.  The patient has been provided with contact information for the Managed Medicaid care management team and has been advised to call with any health related questions or concerns.

## 2022-05-01 NOTE — Patient Outreach (Signed)
Medicaid Managed Care   Nurse Care Manager Note  05/01/2022 Name:  Melinda [name] MRN:  [id number] DOB:  Feb 25, 1966  Melinda [name] is an 56 y.o. year old female who is a primary patient of [name], MD.  The [facility] Managed Care Coordination team was consulted for assistance with:    Chronic healthcare management needs, DM, HTN, chronic pain, migraines, anxiety/depression, asthma, glaucoma  [name] was given information about Medicaid Managed Care Coordination team services today. Melinda [name] Patient agreed to services and verbal consent obtained.  Engaged with patient by telephone for follow up visit in response to provider referral for case management and/or care coordination services.   Assessments/Interventions:  Review of past medical history, allergies, medications, health status, including review of consultants reports, laboratory and other test data, was performed as part of comprehensive evaluation and provision of chronic care management services.  SDOH (Social Determinants of Health) assessments and interventions performed: SDOH Interventions    Flowsheet Row Patient Outreach Telephone from 05/01/2022 in [place] Patient Outreach Telephone from 01/15/2022 in [place] Patient Outreach Telephone from 01/08/2022 in [place] Patient Outreach Telephone from 12/07/2021 in [place] Patient Outreach Telephone from 10/01/2021 in [place] Patient Outreach Telephone from 08/03/2021 in [place] Coordination  SDOH Interventions        Food Insecurity Interventions -- -- Intervention Not Indicated -- -- Intervention Not Indicated  Housing Interventions -- -- -- Intervention Not Indicated -- --  Transportation Interventions -- -- Intervention Not  Indicated -- -- --  Utilities Interventions Intervention Not Indicated -- -- -- -- --  Financial Strain Interventions Intervention Not Indicated -- -- -- Intervention Not Indicated --  Physical Activity Interventions -- --  [not able to engage in physical activity] -- -- -- --  [not able to engage in stenuous exercise]  Stress Interventions -- -- -- --  [place] Psychiatrist-medications adjusted] -- --      Care Plan  Allergies  Allergen Reactions   Ceftriaxone Anaphylaxis and Other (See Comments)    ROCEPHIN   Metformin Anaphylaxis    ALL   Penicillins Shortness Of Breath    Has patient had a PCN reaction causing immediate rash, facial/tongue/throat swelling, SOB or lightheadedness with hypotension: Yes Has patient had a PCN reaction causing severe rash involving mucus membranes or skin necrosis: No Has patient had a PCN reaction that required hospitalization No Has patient had a PCN reaction occurring within the last 10 years: No If all of the above answers are "NO", then [name] proceed with Cephalosporin use.    Shellfish Allergy Anaphylaxis   Flonase [Fluticasone Propionate]     Makes migraines worse   Gold-Containing Drug Products     HANDS ITCH   Metformin Hcl Other (See Comments)   Nickel     HANDS SWELL   Penicillin G Other (See Comments)   Citrus Rash   Medications Reviewed Today     Reviewed by Gayla Medicus, RN (Registered Nurse) on 05/01/22 at [phone]  Med List Status: <None>   Medication Order Taking? Sig Documenting Provider Last Dose Status Informant  acetaminophen (TYLENOL) 500 MG tablet [id number] No Take 1 tablet (500 mg total) by mouth every 6 (six) hours as needed. Melinda Oto, MD Taking Active   AIMOVIG 140 MG/ML SOAJ [id number]  INJECT 140 MG INTO THE SKIN EVERY 28 (TWENTY-EIGHT)  DAYS. [name], DO  Active   albuterol (PROVENTIL) (2.5 MG/3ML) 0.083% nebulizer solution [id number] No Take 3 mLs (2.5 mg total) by nebulization every 6 (six) hours as needed for  wheezing or shortness of breath. [name], MD Taking Active Self  albuterol (VENTOLIN HFA) 108 (90 Base) MCG/ACT inhaler [id number] No Inhale 2 puffs into the lungs every 6 (six) hours as needed for wheezing or shortness of breath. [name], MD Taking Active Self  atorvastatin (LIPITOR) 20 MG tablet [id number]  TAKE ONE TABLET BY MOUTH ONCE DAILY AT BEDTIME [name], MD  Active   budesonide-formoterol [facility]) 160-4.5 MCG/ACT inhaler [id number] No Inhale 2 puffs into the lungs 2 (two) times daily. [name], MD Taking Active   buPROPion (WELLBUTRIN XL) 300 MG 24 hr tablet [id number] No Take 300 mg by mouth daily.  [provider] Taking Active Self  busPIRone (BUSPAR) 10 MG tablet [id number] No Take 10 mg by mouth 2 (two) times daily. [provider] Taking Active Self           Med Note Melinda [name], [facility]   Wed Feb 18, 2018 10:03 PM)    Cholecalciferol (VITAMIN D3) 10 MCG (400 UNIT) CAPS [id number] No Take 400 Int'l Units/day by mouth daily. [name], MD Taking Active   cyclobenzaprine (FLEXERIL) 10 MG tablet [id number]  TAKE 1 TABLET (10 MG TOTAL) BY MOUTH 3 (THREE) TIMES DAILY AS NEEDED FOR MUSCLE SPASMS. [name], MD  Active   doxepin (SINEQUAN) 50 MG capsule [id number] No Take 150 mg by mouth at bedtime. [provider] Taking Active Self  ELIQUIS 5 MG TABS tablet [id number]  TAKE ONE TABLET BY MOUTH TWICE DAILY MORNING AND [name], MD  Active   furosemide (LASIX) 20 MG tablet [id number]  TAKE 1 TABLET (20 MG TOTAL) BY MOUTH DAILY (AM) [name], MD  Active   gabapentin (NEURONTIN) 300 MG capsule [id number]  TAKE ONE CAPSULE BY MOUTH THREE TIMES DAILY (AM+NOON+BEDTIME) [name], MD  Active   loratadine (CLARITIN) 10 MG tablet [id number]  TAKE 1 TABLET (10 MG TOTAL) BY MOUTH DAILY (AM) [name], MD  Active   metoprolol tartrate (LOPRESSOR) 50 MG tablet [id number]  TAKE  ONE TABLET BY MOUTH TWICE DAILY (AM+BEDTIME) [name], MD  Active   montelukast (SINGULAIR) 10 MG tablet [id number]  TAKE 1 TABLET BY MOUTH EVERY EVENING AT BEDTIME [name], MD  Active   OZEMPIC, 1 MG/DOSE, 4 MG/3ML SOPN [id number] No Inject 1 mg into the skin once a week. [provider] Taking Active   prazosin (MINIPRESS) 2 MG capsule [id number] No Take 6 mg by mouth at bedtime. [provider] Taking Active Self  SYNTHROID 200 MCG tablet [id number] No Take 1 tablet (200 mcg total) by mouth daily before breakfast. [name], MD Taking Active   topiramate (TOPAMAX) 200 MG tablet [id number] No TAKE 1 TABLET (200 MG TOTAL) BY MOUTH AT BEDTIME. [name], DO Taking Active   VRAYLAR 1.5 MG capsule [id number] No Take 1.5 mg by mouth daily. 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   Abdominal pannus 08/21/2021   Hardening of the aorta (main artery of the heart) ([place]) 08/21/2021   At high risk for falls 04/20/2020   Hyperlipidemia 02/16/2020   Adrenal mass, right ([place]) 04/29/2019   Chronic cough 03/12/2018   Left shoulder pain 10/14/2016   Primary hypertension 10/14/2016   S/P thyroidectomy 06/27/2016   Chronic bilateral low back pain without sciatica 04/22/2016   Liver lesion 12/24/2015   Malignant tumor of thyroid gland ([place]) 11/16/2015   Controlled type 2 diabetes mellitus with complication, with long-term current use of insulin ([place])  10/10/2015   Vision loss 08/25/2015   Decreased vision in both eyes 08/25/2015   Prolonged Q-T interval on ECG 08/19/2015   History of pulmonary embolism 08/10/2015   Mild persistent asthma 04/13/2015   Nonruptured cerebral aneurysm 04/13/2015   Fibroid, uterine    Morbid obesity ([place]) 11/11/2014   Atypical chest pain 11/08/2014   Sinus tachycardia 11/08/2014   Migraine 10/14/2014   Menorrhagia 09/08/2014   Conditions to be addressed/monitored per PCP order:  Chronic healthcare management needs, DM, HTN, chronic pain, migraines, anxiety/depression, asthma, glaucoma, HLD  Care Plan : General Plan of Care (Adult)  Updates made by Gayla Medicus, RN since 05/01/2022 12:00 AM     Problem: Health Promotion or Disease Self-Management (General Plan of Care)   Priority: Medium  Onset Date: 10/20/2020     Long-Range Goal: Self-Management Plan Developed   Start Date: 07/14/2020  Expected End Date: 08/01/2022  Recent Progress: On track  Priority: Medium  Note:    Current Barriers:  Chronic Disease Management support and education needs Patient with anxiety and depression-has appointment with Psychiatrist  scheduled every 3 months, therapist every 2 weeks 05/01/22:  Patient continues to lose weight on Ozempic, 327 now.   Patient will have surgery on right adrenal gland to remove myelolipoma after losing more weight in additon to hernia and spinal surgery,  Blood sugars 120-130.  Patient to schedule a follow up appt with eye provider.  No complaints today, no AUB. Nurse Case Manager Clinical Goal(s):  Over the next 90 days, patient will attend all scheduled medical appointments:  Over the next 30 days, patient will work with CM team pharmacist to review medications Interventions:  Inter-disciplinary care team collaboration (see longitudinal plan of care) Evaluation of current treatment plan  and patient's adherence to plan as established by provider. Reviewed medications with  patient. Collaborated with pharmacy regarding medications. Discussed plans with patient for ongoing care management follow up and provided patient with direct contact information for care management team Reviewed scheduled/upcoming provider appointments. Collaborated with SW for psychotherapy referral. SW referral for psychotherapy provider-completed Pharmacy referral for medication review-completed Collaborated with PCP for new BP cuff Self Care Activities: Over the next 90 days, patient will:  -Self administers medications as prescribed Attends all scheduled provider appointments Calls pharmacy for medication refills Calls provider office for new concerns or questions Patient will locate meter to check blood sugars  Follow Up Plan: The Managed Medicaid care management team will reach out to the patient again over the next 30  business days.  The patient has been provided with contact information for the Managed Medicaid care management team and has been advised to call with any health related questions or concerns.    Follow  Up:  Patient agrees to Care Plan and Follow-up.  Plan: The Managed Medicaid care management team will reach out to the patient again over the next 30 business  days. and The  Patient has been provided with contact information for the Managed Medicaid care management team and has been advised to call with any health related questions or concerns.  Date/time of next scheduled RN care management/care coordination outreach: 06/03/22 at 1030.

## 2022-05-14 DIAGNOSIS — F4325 Adjustment disorder with mixed disturbance of emotions and conduct: Secondary | ICD-10-CM | POA: Diagnosis not present

## 2022-05-29 ENCOUNTER — Ambulatory Visit
Admission: RE | Admit: 2022-05-29 | Discharge: 2022-05-29 | Disposition: A | Discharge status: Home / Self Care | Payer: Medicaid Other | Source: Ambulatory Visit | Attending: Internal Medicine | Admitting: Internal Medicine

## 2022-05-29 DIAGNOSIS — Z1231 Encounter for screening mammogram for malignant neoplasm of breast: Secondary | ICD-10-CM | POA: Diagnosis not present

## 2022-05-29 DIAGNOSIS — F4325 Adjustment disorder with mixed disturbance of emotions and conduct: Secondary | ICD-10-CM | POA: Diagnosis not present

## 2022-05-30 ENCOUNTER — Other Ambulatory Visit: Payer: Self-pay | Admitting: Internal Medicine

## 2022-05-30 DIAGNOSIS — J4550 Severe persistent asthma, uncomplicated: Secondary | ICD-10-CM

## 2022-05-30 DIAGNOSIS — Z86718 Personal history of other venous thrombosis and embolism: Secondary | ICD-10-CM

## 2022-05-30 DIAGNOSIS — I10 Essential (primary) hypertension: Secondary | ICD-10-CM

## 2022-05-30 DIAGNOSIS — E118 Type 2 diabetes mellitus with unspecified complications: Secondary | ICD-10-CM

## 2022-05-30 NOTE — Telephone Encounter (Signed)
Requested medication (s) are due for refill today: yes  Requested medication (s) are on the active medication list: yes  Last refill:  03/05/22 #30/2  Future visit scheduled: no  Notes to clinic:  Unable to refill per protocol due to failed labs, no updated results.    Requested Prescriptions  Pending Prescriptions Disp Refills   atorvastatin (LIPITOR) 20 MG tablet [Pharmacy Med Name: ATORVASTATIN CALCIUM 20 MG ORAL TABLET] 30 tablet 2    Sig: TAKE ONE TABLET BY MOUTH ONCE DAILY AT BEDTIME     Cardiovascular:  Antilipid - Statins Failed - 05/30/2022  1:16 PM      Failed - Lipid Panel in normal range within the last 12 months    Cholesterol, Total  Date Value Ref Range Status  04/20/2020 130 100 - 199 mg/dL Final   LDL Chol Calc (NIH)  Date Value Ref Range Status  04/20/2020 68 0 - 99 mg/dL Final   HDL  Date Value Ref Range Status  04/20/2020 44 >39 mg/dL Final   Triglycerides  Date Value Ref Range Status  04/20/2020 95 0 - 149 mg/dL Final         Passed - Patient is not pregnant      Passed - Valid encounter within last 12 months    Recent Outpatient Visits           5 months ago Type 2 diabetes mellitus with morbid obesity ([place])   [place], Melinda Laming B, MD   9 months ago Type 2 diabetes mellitus with morbid obesity ([place])   [place] [name], MD   1 year ago Bulging lumbar disc   [place], Melinda [name], [name]   1 year ago Type 2 diabetes mellitus with morbid obesity [name])   [place], MD   1 year ago Poor sleep   Melinda [place], MD              Signed Prescriptions Disp Refills   montelukast (SINGULAIR) 10 MG tablet 30 tablet 2    Sig: TAKE 1 TABLET BY MOUTH EVERY EVENING AT BEDTIME     Pulmonology:  Leukotriene Inhibitors Passed - 05/30/2022   1:16 PM      Passed - Valid encounter within last 12 months    Recent Outpatient Visits           5 months ago Type 2 diabetes mellitus with morbid obesity (Melinda [name])   [place] Melinda Plumber B, MD   9 months ago Type 2 diabetes mellitus with morbid obesity Melinda [name])   [place] [name], MD   1 year ago Bulging lumbar disc   Melinda [name], Melinda [name], [name]   1 year ago Type 2 diabetes mellitus with morbid obesity [name])   [facility] [name], MD   1 year ago Poor sleep   Melinda [name], Melinda B, MD               metoprolol tartrate (LOPRESSOR) 50 MG tablet 60 tablet 2    Sig: TAKE ONE TABLET BY MOUTH TWICE DAILY (AM+BEDTIME)     Cardiovascular:  Beta Blockers Passed - 05/30/2022  1:16 PM      Passed -  Last BP in normal range    BP Readings from Last 1 Encounters:  02/01/22 111/77         Passed - Last Heart Rate in normal range    Pulse Readings from Last 1 Encounters:  02/01/22 99         Passed - Valid encounter within last 6 months    Recent Outpatient Visits           5 months ago Type 2 diabetes mellitus with morbid obesity ([place])   [place] Melinda Plumber B, MD   9 months ago Type 2 diabetes mellitus with morbid obesity ([place])   [place] [name], MD   1 year ago Bulging lumbar disc   Melinda [name], Melinda [name], [name]   1 year ago Type 2 diabetes mellitus with morbid obesity (Melinda [name])   Melinda [name] [name], MD   1 year ago Poor sleep   Melinda [name], Melinda Batman, MD               ELIQUIS 5 MG TABS tablet 60 tablet 2    Sig: TAKE ONE TABLET BY Melinda [name]     Hematology:   Anticoagulants - apixaban Passed - 05/30/2022  1:16 PM      Passed - PLT in normal range and within 360 days    Platelets  Date Value Ref Range Status  02/01/2022 222 150 - 400 K/uL Final  04/29/2019 289 150 - 450 x10E3/uL Final         Passed - HGB in normal range and within 360 days    Hemoglobin  Date Value Ref Range Status  02/01/2022 13.3 12.0 - 15.0 g/dL Final  08/25/2020 15.7 11.1 - 15.9 g/dL Final         Passed - HCT in normal range and within 360 days    HCT  Date Value Ref Range Status  02/01/2022 40.5 36.0 - 46.0 % Final   Hematocrit  Date Value Ref Range Status  08/25/2020 46.6 34.0 - 46.6 % Final         Passed - Cr in normal range and within 360 days    Creat  Date Value Ref Range Status  06/27/2016 0.72 0.50 - 1.10 mg/dL Final   Creatinine, Ser  Date Value Ref Range Status  02/01/2022 0.86 0.44 - 1.00 mg/dL Final   Creatinine, Urine  Date Value Ref Range Status  08/25/2015 57 20 - 320 mg/dL Final         Passed - AST in normal range and within 360 days    AST  Date Value Ref Range Status  02/01/2022 21 15 - 41 U/L Final         Passed - ALT in normal range and within 360 days    ALT  Date Value Ref Range Status  02/01/2022 31 0 - 44 U/L Final         Passed - Valid encounter within last 12 months    Recent Outpatient Visits           5 months ago Type 2 diabetes mellitus with morbid obesity (Melinda [name])   [place] Melinda Plumber B, MD   9 months ago Type 2 diabetes mellitus with morbid obesity Melinda [name])   [place],  Melinda Batman, MD   1 year ago Bulging lumbar disc   Melinda [name], [place], [name]   1 year ago Type 2 diabetes mellitus with morbid obesity [name] [place])   [place] [name] [name], MD   1 year ago Poor sleep   [place] [name], MD

## 2022-05-30 NOTE — Telephone Encounter (Signed)
Requested Prescriptions  Pending Prescriptions Disp Refills   atorvastatin (LIPITOR) 20 MG tablet [Pharmacy Med Name: ATORVASTATIN CALCIUM 20 MG ORAL TABLET] 30 tablet 2    Sig: TAKE ONE TABLET BY MOUTH ONCE DAILY AT BEDTIME     Cardiovascular:  Antilipid - Statins Failed - 05/30/2022  1:16 PM      Failed - Lipid Panel in normal range within the last 12 months    Cholesterol, Total  Date Value Ref Range Status  04/20/2020 130 100 - 199 mg/dL Final   LDL Chol Calc (NIH)  Date Value Ref Range Status  04/20/2020 68 0 - 99 mg/dL Final   HDL  Date Value Ref Range Status  04/20/2020 44 >39 mg/dL Final   Triglycerides  Date Value Ref Range Status  04/20/2020 95 0 - 149 mg/dL Final         Passed - Patient is not pregnant      Passed - Valid encounter within last 12 months    Recent Outpatient Visits           5 months ago Type 2 diabetes mellitus with morbid obesity ([place])   [place], Neoma Laming B, MD   9 months ago Type 2 diabetes mellitus with morbid obesity ([place])   [place] [name], MD   1 year ago Bulging lumbar disc   [place], [place], [name]   1 year ago Type 2 diabetes mellitus with morbid obesity [facility])   [place], Deborah B, MD   1 year ago Poor sleep   [place], MD               montelukast (SINGULAIR) 10 MG tablet [Pharmacy Med Name: MONTELUKAST SODIUM 10 MG ORAL TABLET] 30 tablet 2    Sig: TAKE 1 TABLET BY MOUTH EVERY EVENING AT BEDTIME     Pulmonology:  Leukotriene Inhibitors Passed - 05/30/2022  1:16 PM      Passed - Valid encounter within last 12 months    Recent Outpatient Visits           5 months ago Type 2 diabetes mellitus with morbid obesity ([place])   [place] [name] B, MD   9 months ago Type  2 diabetes mellitus with morbid obesity [facility])   [place] [name], MD   1 year ago Bulging lumbar disc   [place], [place], [name]   1 year ago Type 2 diabetes mellitus with morbid obesity [facility])   [place] [name], MD   1 year ago Poor sleep   [place] [name] B, MD               metoprolol tartrate (LOPRESSOR) 50 MG tablet [Pharmacy Med Name: METOPROLOL TARTRATE 50 MG ORAL TABLET] 60 tablet 2    Sig: TAKE ONE TABLET BY MOUTH TWICE DAILY (AM+BEDTIME)     Cardiovascular:  Beta Blockers Passed - 05/30/2022  1:16 PM      Passed - Last BP in normal range    BP Readings from Last 1 Encounters:  02/01/22 111/77         Passed - Last Heart Rate in normal range    Pulse Readings  from Last 1 Encounters:  02/01/22 99         Passed - Valid encounter within last 6 months    Recent Outpatient Visits           5 months ago Type 2 diabetes mellitus with morbid obesity ([place])   [place] [name], MD   9 months ago Type 2 diabetes mellitus with morbid obesity ([place])   [place] [name], MD   1 year ago Bulging lumbar disc   [place], [place], [name]   1 year ago Type 2 diabetes mellitus with morbid obesity ([place])   [place] [name], MD   1 year ago Poor sleep   [place], MD               ELIQUIS 5 MG TABS tablet [Pharmacy Med Name: ELIQUIS 5 MG ORAL TABLET] 60 tablet 2    Sig: TAKE ONE TABLET BY [place]     Hematology:  Anticoagulants - apixaban Passed - 05/30/2022  1:16 PM      Passed - PLT in normal range and within 360 days    Platelets  Date Value Ref Range  Status  02/01/2022 222 150 - 400 K/uL Final  04/29/2019 289 150 - 450 x10E3/uL Final         Passed - HGB in normal range and within 360 days    Hemoglobin  Date Value Ref Range Status  02/01/2022 13.3 12.0 - 15.0 g/dL Final  08/25/2020 15.7 11.1 - 15.9 g/dL Final         Passed - HCT in normal range and within 360 days    HCT  Date Value Ref Range Status  02/01/2022 40.5 36.0 - 46.0 % Final   Hematocrit  Date Value Ref Range Status  08/25/2020 46.6 34.0 - 46.6 % Final         Passed - Cr in normal range and within 360 days    Creat  Date Value Ref Range Status  06/27/2016 0.72 0.50 - 1.10 mg/dL Final   Creatinine, Ser  Date Value Ref Range Status  02/01/2022 0.86 0.44 - 1.00 mg/dL Final   Creatinine, Urine  Date Value Ref Range Status  08/25/2015 57 20 - 320 mg/dL Final         Passed - AST in normal range and within 360 days    AST  Date Value Ref Range Status  02/01/2022 21 15 - 41 U/L Final         Passed - ALT in normal range and within 360 days    ALT  Date Value Ref Range Status  02/01/2022 31 0 - 44 U/L Final         Passed - Valid encounter within last 12 months    Recent Outpatient Visits           5 months ago Type 2 diabetes mellitus with morbid obesity ([place])   [place] [name] B, MD   9 months ago Type 2 diabetes mellitus with morbid obesity [facility])   [place] [name], MD   1 year ago Bulging lumbar disc   [place], [place], [name]   1 year ago Type 2  diabetes mellitus with morbid obesity [facility])   [place] [name], MD   1 year ago Poor sleep   [place] [name], MD

## 2022-06-03 ENCOUNTER — Encounter: Payer: Self-pay | Admitting: Obstetrics and Gynecology

## 2022-06-03 ENCOUNTER — Other Ambulatory Visit: Payer: Medicaid Other | Admitting: Obstetrics and Gynecology

## 2022-06-03 DIAGNOSIS — F411 Generalized anxiety disorder: Secondary | ICD-10-CM | POA: Diagnosis not present

## 2022-06-03 DIAGNOSIS — F4310 Post-traumatic stress disorder, unspecified: Secondary | ICD-10-CM | POA: Diagnosis not present

## 2022-06-03 DIAGNOSIS — F331 Major depressive disorder, recurrent, moderate: Secondary | ICD-10-CM | POA: Diagnosis not present

## 2022-06-03 NOTE — Patient Outreach (Signed)
Medicaid Managed Care   Nurse Care Manager Note  06/03/2022 Name:  Melinda [name] MRN:  [id number] DOB:  [birth year]  Melinda [name] is an 56 y.o. year old female who is a primary patient of [place], [place], [place].  The [facility] Managed Care Coordination team was consulted for assistance with:    Chronic healthcare management needs, DM, HTN, chronic pain, migraines, anxiety/depression, asthma  Ms. Reichard was given information about Medicaid Managed Care Coordination team services today. Melinda [name] Patient agreed to services and verbal consent obtained.  Engaged with patient by telephone for follow up visit in response to provider referral for case management and/or care coordination services.   Assessments/Interventions:  Review of past medical history, allergies, medications, health status, including review of consultants reports, laboratory and other test data, was performed as part of comprehensive evaluation and provision of chronic care management services.  SDOH (Social Determinants of Health) assessments and interventions performed: SDOH Interventions    Flowsheet Row Patient Outreach Telephone from 06/03/2022 in [place] Patient Outreach Telephone from 05/01/2022 in [place] Patient Outreach Telephone from 01/15/2022 in [place] Patient Outreach Telephone from 01/08/2022 in [place] Patient Outreach Telephone from 12/07/2021 in [place] Patient Outreach Telephone from 10/01/2021 in [place] Coordination  SDOH Interventions        Food Insecurity Interventions -- -- -- Intervention Not Indicated -- --  Housing Interventions Intervention Not Indicated -- -- -- Intervention Not Indicated --  Transportation Interventions -- -- -- Intervention Not Indicated -- --   Utilities Interventions -- Intervention Not Indicated -- -- -- --  Financial Strain Interventions -- Intervention Not Indicated -- -- -- Intervention Not Indicated  Physical Activity Interventions -- -- --  [not able to engage in physical activity] -- -- --  Stress Interventions -- -- -- -- --  [place] Psychiatrist-medications adjusted] --  Social Connections Interventions Intervention Not Indicated -- -- -- -- --      Care Plan  Allergies  Allergen Reactions   Ceftriaxone Anaphylaxis and Other (See Comments)    ROCEPHIN   Metformin Anaphylaxis    ALL   Penicillins Shortness Of Breath    Has patient had a PCN reaction causing immediate rash, facial/tongue/throat swelling, SOB or lightheadedness with hypotension: Yes Has patient had a PCN reaction causing severe rash involving mucus membranes or skin necrosis: No Has patient had a PCN reaction that required hospitalization No Has patient had a PCN reaction occurring within the last 10 years: No If all of the above answers are "NO", then may proceed with Cephalosporin use.    Shellfish Allergy Anaphylaxis   Flonase [Fluticasone Propionate]     Makes migraines worse   Gold-Containing Drug Products     HANDS ITCH   Metformin Hcl Other (See Comments)   Nickel     HANDS SWELL   Penicillin G Other (See Comments)   Citrus Rash   Medications Reviewed Today     Reviewed by Gayla Medicus, RN (Registered Nurse) on 06/03/22 at 55  Med List Status: <None>   Medication Order Taking? Sig Documenting Provider Last Dose Status Informant  acetaminophen (TYLENOL) 500 MG tablet [id number] No Take 1 tablet (500 mg total) by mouth every 6 (six) hours as needed. Jessy Oto, MD Taking Active   AIMOVIG 140 MG/ML SOAJ [id number]  INJECT 140 MG INTO  THE SKIN EVERY 28 (TWENTY-EIGHT) DAYS. [name], DO  Active   albuterol (PROVENTIL) (2.5 MG/3ML) 0.083% nebulizer solution [id number] No Take 3 mLs (2.5 mg total) by nebulization every 6 (six) hours  as needed for wheezing or shortness of breath. [name], MD Taking Active Self  albuterol (VENTOLIN HFA) 108 (90 Base) MCG/ACT inhaler [id number] No Inhale 2 puffs into the lungs every 6 (six) hours as needed for wheezing or shortness of breath. [name], MD Taking Active Self  atorvastatin (LIPITOR) 20 MG tablet [id number]  TAKE ONE TABLET BY MOUTH ONCE DAILY AT BEDTIME Charlott Rakes, MD  Active   budesonide-formoterol (SYMBICORT) 160-4.5 MCG/ACT inhaler [id number] No Inhale 2 puffs into the lungs 2 (two) times daily. [name], MD Taking Active   buPROPion (WELLBUTRIN XL) 300 MG 24 hr tablet [id number] No Take 300 mg by mouth daily.  [provider] Taking Active Self  busPIRone (BUSPAR) 10 MG tablet [id number] No Take 10 mg by mouth 2 (two) times daily. [provider] Taking Active Self           Med Note [name], [facility]   Wed Feb 18, 2018 10:03 PM)    Cholecalciferol (VITAMIN D3) 10 MCG (400 UNIT) CAPS [id number] No Take 400 Int'l Units/day by mouth daily. [name], MD Taking Active   cyclobenzaprine (FLEXERIL) 10 MG tablet [id number]  TAKE 1 TABLET (10 MG TOTAL) BY MOUTH 3 (THREE) TIMES DAILY AS NEEDED FOR MUSCLE SPASMS. [name], MD  Active   doxepin (SINEQUAN) 50 MG capsule [id number] No Take 150 mg by mouth at bedtime. [provider] Taking Active Self  ELIQUIS 5 MG TABS tablet [id number]  TAKE ONE TABLET BY MOUTH TWICE DAILY MORNING AND [name], MD  Active   furosemide (LASIX) 20 MG tablet [id number]  TAKE 1 TABLET (20 MG TOTAL) BY MOUTH DAILY (AM) [name], MD  Active   gabapentin (NEURONTIN) 300 MG capsule [id number]  TAKE ONE CAPSULE BY MOUTH THREE TIMES DAILY (AM+NOON+BEDTIME) [name], MD  Active   loratadine (CLARITIN) 10 MG tablet [id number]  TAKE 1 TABLET (10 MG TOTAL) BY MOUTH DAILY (AM) [name], MD  Active   metoprolol tartrate (LOPRESSOR) 50 MG tablet  [id number]  TAKE ONE TABLET BY MOUTH TWICE DAILY (AM+BEDTIME) [name], MD  Active   montelukast (SINGULAIR) 10 MG tablet [id number]  TAKE 1 TABLET BY MOUTH EVERY EVENING AT BEDTIME [name], MD  Active   OZEMPIC, 1 MG/DOSE, 4 MG/3ML SOPN [id number] No Inject 1 mg into the skin once a week. [provider] Taking Active   prazosin (MINIPRESS) 2 MG capsule [id number] No Take 6 mg by mouth at bedtime. [provider] Taking Active Self  SYNTHROID 200 MCG tablet [id number] No Take 1 tablet (200 mcg total) by mouth daily before breakfast. [name], MD Taking Active   topiramate (TOPAMAX) 200 MG tablet [id number] No TAKE 1 TABLET (200 MG TOTAL) BY MOUTH AT BEDTIME. [name], DO Taking Active   VRAYLAR 1.5 MG capsule [id number] No Take 1.5 mg by mouth daily. [provider] Taking Active            Patient Active Problem List   Diagnosis Date Noted   Postoperative hypothyroidism 01/21/2022   Esophageal dysphagia 01/21/2022   History of DVT (deep vein thrombosis) 01/21/2022   History of malignant neoplasm of thyroid 01/21/2022   Long term (current) use  of anticoagulants 01/21/2022   Migraine without aura, not refractory 01/21/2022   Type 2 diabetes mellitus with hyperglycemia ([place]) 01/21/2022   Spinal stenosis excluding cervical region 11/21/2021   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   Abdominal pannus 08/21/2021   Hardening of the aorta (main artery of the heart) ([place]) 08/21/2021   At high risk for falls 04/20/2020   Hyperlipidemia 02/16/2020   Adrenal mass, right ([place]) 04/29/2019   Chronic cough 03/12/2018   Left shoulder pain 10/14/2016   Primary hypertension 10/14/2016   S/P thyroidectomy 06/27/2016   Chronic bilateral low back pain without sciatica 04/22/2016   Liver lesion 12/24/2015   Malignant tumor of thyroid gland ([place]) 11/16/2015   Controlled type 2 diabetes mellitus with complication, with long-term current use  of insulin ([place]) 10/10/2015   Vision loss 08/25/2015   Decreased vision in both eyes 08/25/2015   Prolonged Q-T interval on ECG 08/19/2015   History of pulmonary embolism 08/10/2015   Mild persistent asthma 04/13/2015   Nonruptured cerebral aneurysm 04/13/2015   Fibroid, uterine    Morbid obesity ([place]) 11/11/2014   Atypical chest pain 11/08/2014   Sinus tachycardia 11/08/2014   Migraine 10/14/2014   Menorrhagia 09/08/2014   Conditions to be addressed/monitored per PCP order:  Chronic healthcare management needs, DM, HTN, chronic pain, migraines, anxiety/depression, asthma, HLD  Care Plan : General Plan of Care (Adult)  Updates made by Gayla Medicus, RN since 06/03/2022 12:00 AM     Problem: Health Promotion or Disease Self-Management (General Plan of Care)   Priority: Medium  Onset Date: 10/20/2020     Long-Range Goal: Self-Management Plan Developed   Start Date: 07/14/2020  Expected End Date: 08/01/2022  Recent Progress: On track  Priority: Medium  Note:    Current Barriers:  Chronic Disease Management support and education needs Patient with anxiety and depression-has appointment with Psychiatrist  scheduled every 3 months, therapist every 2 weeks 06/03/22:  Patient with no complaints today, plans on starting school online in January [place] Management- will take 1.5 years-program through [place].  Completed aquatic therapy.  Blood sugars WNL Nurse Case Manager Clinical Goal(s):  Over the next 90 days, patient will attend all scheduled medical appointments:  Over the next 30 days, patient will work with CM team pharmacist to review medications Interventions:  Inter-disciplinary care team collaboration (see longitudinal plan of care) Evaluation of current treatment plan  and patient's adherence to plan as established by provider. Reviewed medications with patient. Collaborated with pharmacy regarding medications. Discussed plans with patient for ongoing care  management follow up and provided patient with direct contact information for care management team Reviewed scheduled/upcoming provider appointments. Collaborated with SW for psychotherapy referral. SW referral for psychotherapy provider-completed Pharmacy referral for medication review-completed Collaborated with PCP for new BP cuff Self Care Activities: Over the next 90 days, patient will:  -Self administers medications as prescribed Attends all scheduled provider appointments Calls pharmacy for medication refills Calls provider office for new concerns or questions Patient will locate meter to check blood sugars Schedule eye provider appt.  Follow Up Plan: The Managed Medicaid care management team will reach out to the patient again over the next 30  business days.  The patient has been provided with contact information for the Managed Medicaid care management team and has been advised to call with any health related questions or concerns.    Follow Up:  Patient agrees to Care Plan and Follow-up.  Plan: The Managed Medicaid care management team  will reach out to the patient again over the next 30 business  days. and The  Patient has been provided with contact information for the Managed Medicaid care management team and has been advised to call with any health related questions or concerns.  Date/time of next scheduled RN care management/care coordination outreach: 07/05/22 at 315.

## 2022-06-03 NOTE — Patient Instructions (Signed)
Hi Ms. Grieves for speaking with me-have a great day and week!!  Ms. Dipinto was given information about Medicaid Managed Care team care coordination services as a part of their [place] Medicaid benefit. [name] verbally consented to engagement with the [facility] Managed Care team.   If you are experiencing a medical emergency, please call 911 or report to your local emergency department or urgent care.   If you have a non-emergency medical problem during routine business hours, please contact your provider's office and ask to speak with a nurse.   For questions related to your [facility], please call: [phone] or visit the homepage here: [email or web address]  If you would like to schedule transportation through your [facility], please call the following number at least 2 days in advance of your appointment: [phone]   Rides for urgent appointments can also be made after hours by calling Member Services.  Call the [place] at [phone], at any time, 24 hours a day, 7 days a week. If you are in danger or need immediate medical attention call 911.  If you would like help to quit smoking, call 1-800-QUIT-NOW 269-599-6376) OR Espaol: 1-855-Djelo-Ya (9-326-712-4580) o para ms informacin haga clic aqu or Text READY to 200-400 to register via text  Ms. Pelcher - following are the goals we discussed in your visit today:   Goals Addressed             This Visit's Progress    Protect My Health         Timeframe:  Long-Range Goal Priority:  Medium Start Date:    07/14/20                         Expected End Date:  ongoing          Follow Up Date: 07/05/22 - schedule appointment for vaccines needed due to my age or health - schedule recommended health tests (blood work, mammogram, colonoscopy, pap test) - schedule and keep  appointment for annual check-up  06/03/22:  Upcoming appointments with therapist and Psychologist   Patient verbalizes understanding of instructions and care plan provided today and agrees to view in [place]. Active MyChart status and patient understanding of how to access instructions and care plan via MyChart confirmed with patient.     The Managed Medicaid care management team will reach out to the patient again over the next 30 business  days.  The  Patient has been provided with contact information for the Managed Medicaid care management team and has been advised to call with any health related questions or concerns.   Aida Raider RN, BSN [place] Management Coordinator - Managed Medicaid High Risk [phone]   Following is a copy of your plan of care:  Care Plan : General Plan of Care (Adult)  Updates made by Gayla Medicus, RN since 06/03/2022 12:00 AM     Problem: Health Promotion or Disease Self-Management (General Plan of Care)   Priority: Medium  Onset Date: 10/20/2020     Long-Range Goal: Self-Management Plan Developed   Start Date: 07/14/2020  Expected End Date: 08/01/2022  Recent Progress: On track  Priority: Medium  Note:    Current Barriers:  Chronic Disease Management support and education needs Patient with anxiety and depression-has appointment with Psychiatrist  scheduled every 3 months, therapist every 2 weeks 06/03/22:  Patient  with no complaints today, plans on starting school online in January [place] Management- will take 1.5 years-program through [place].  Completed aquatic therapy.  Blood sugars WNL Nurse Case Manager Clinical Goal(s):  Over the next 90 days, patient will attend all scheduled medical appointments:  Over the next 30 days, patient will work with CM team pharmacist to review medications Interventions:  Inter-disciplinary care team collaboration (see longitudinal plan of care) Evaluation of  current treatment plan  and patient's adherence to plan as established by provider. Reviewed medications with patient. Collaborated with pharmacy regarding medications. Discussed plans with patient for ongoing care management follow up and provided patient with direct contact information for care management team Reviewed scheduled/upcoming provider appointments. Collaborated with SW for psychotherapy referral. SW referral for psychotherapy provider-completed Pharmacy referral for medication review-completed Collaborated with PCP for new BP cuff Self Care Activities: Over the next 90 days, patient will:  -Self administers medications as prescribed Attends all scheduled provider appointments Calls pharmacy for medication refills Calls provider office for new concerns or questions Patient will locate meter to check blood sugars Schedule eye provider appt.  Follow Up Plan: The Managed Medicaid care management team will reach out to the patient again over the next 30  business days.  The patient has been provided with contact information for the Managed Medicaid care management team and has been advised to call with any health related questions or concerns.

## 2022-06-28 ENCOUNTER — Other Ambulatory Visit: Payer: Self-pay | Admitting: Neurology

## 2022-06-28 ENCOUNTER — Other Ambulatory Visit: Payer: Self-pay | Admitting: Internal Medicine

## 2022-06-28 DIAGNOSIS — R600 Localized edema: Secondary | ICD-10-CM

## 2022-06-28 DIAGNOSIS — G43009 Migraine without aura, not intractable, without status migrainosus: Secondary | ICD-10-CM

## 2022-06-28 DIAGNOSIS — I671 Cerebral aneurysm, nonruptured: Secondary | ICD-10-CM

## 2022-07-03 DIAGNOSIS — F4325 Adjustment disorder with mixed disturbance of emotions and conduct: Secondary | ICD-10-CM | POA: Diagnosis not present

## 2022-07-05 ENCOUNTER — Encounter: Payer: Self-pay | Admitting: Obstetrics and Gynecology

## 2022-07-05 ENCOUNTER — Other Ambulatory Visit: Payer: Medicaid Other | Admitting: Obstetrics and Gynecology

## 2022-07-05 NOTE — Patient Outreach (Signed)
Medicaid Managed Care   Nurse Care Manager Note  07/05/2022 Name:  Melinda [name] MRN:  [id number] DOB:  [birth year]  Melinda [name] is an 57 y.o. year old female who is a primary patient of [place], [place], [place].  The [facility] Managed Care Coordination team was consulted for assistance with:    Chronic healthcare management needs, DM, HTN, chronic pain, migraines, anxiety/depression,asthma, HLD  Ms. Godar was given information about Medicaid Managed Care Coordination team services today. Melinda [name] Patient agreed to services and verbal consent obtained.  Engaged with patient by telephone for follow up visit in response to provider referral for case management and/or care coordination services.   Assessments/Interventions:  Review of past medical history, allergies, medications, health status, including review of consultants reports, laboratory and other test data, was performed as part of comprehensive evaluation and provision of chronic care management services.  SDOH (Social Determinants of Health) assessments and interventions performed: SDOH Interventions    Flowsheet Row Patient Outreach Telephone from 07/05/2022 in [place] Patient Outreach Telephone from 06/03/2022 in [place] Patient Outreach Telephone from 05/01/2022 in [place] Patient Outreach Telephone from 01/15/2022 in [place] Patient Outreach Telephone from 01/08/2022 in [place] Patient Outreach Telephone from 12/07/2021 in [place] Coordination  SDOH Interventions        Food Insecurity Interventions -- -- -- -- Intervention Not Indicated --  Housing Interventions -- Intervention Not Indicated -- -- -- Intervention Not Indicated  Transportation Interventions Intervention Not Indicated -- -- -- Intervention Not  Indicated --  Utilities Interventions -- -- Intervention Not Indicated -- -- --  Financial Strain Interventions -- -- Intervention Not Indicated -- -- --  Physical Activity Interventions -- -- -- --  [not able to engage in physical activity] -- --  Stress Interventions Other (Comment)  [sees Psychiatrist on a regular basis on medication] -- -- -- -- --  [sees Psychiatrist-medications adjusted]  Social Connections Interventions -- Intervention Not Indicated -- -- -- --     Care Plan  Allergies  Allergen Reactions   Ceftriaxone Anaphylaxis and Other (See Comments)    ROCEPHIN   Metformin Anaphylaxis    ALL   Penicillins Shortness Of Breath    Has patient had a PCN reaction causing immediate rash, facial/tongue/throat swelling, SOB or lightheadedness with hypotension: Yes Has patient had a PCN reaction causing severe rash involving mucus membranes or skin necrosis: No Has patient had a PCN reaction that required hospitalization No Has patient had a PCN reaction occurring within the last 10 years: No If all of the above answers are "NO", then may proceed with Cephalosporin use.    Shellfish Allergy Anaphylaxis   Flonase [Fluticasone Propionate]     Makes migraines worse   Gold-Containing Drug Products     HANDS ITCH   Metformin Hcl Other (See Comments)   Nickel     HANDS SWELL   Penicillin G Other (See Comments)   Citrus Rash   Medications Reviewed Today     Reviewed by Gayla Medicus, RN (Registered Nurse) on 07/05/22 at 1  Med List Status: <None>   Medication Order Taking? Sig Documenting Provider Last Dose Status Informant  acetaminophen (TYLENOL) 500 MG tablet [id number] No Take 1 tablet (500 mg total) by mouth every 6 (six) hours as needed. Jessy Oto, MD Taking Active   AIMOVIG 140  MG/ML SOAJ [id number]  INJECT 140 MG INTO THE SKIN EVERY 28 (TWENTY-EIGHT) DAYS. [name], DO  Active   albuterol (PROVENTIL) (2.5 MG/3ML) 0.083% nebulizer solution [id number] No Take 3  mLs (2.5 mg total) by nebulization every 6 (six) hours as needed for wheezing or shortness of breath. [name], MD Taking Active Self  albuterol (VENTOLIN HFA) 108 (90 Base) MCG/ACT inhaler [id number] No Inhale 2 puffs into the lungs every 6 (six) hours as needed for wheezing or shortness of breath. [name], MD Taking Active Self  atorvastatin (LIPITOR) 20 MG tablet [id number]  TAKE ONE TABLET BY MOUTH ONCE DAILY AT BEDTIME Charlott Rakes, MD  Active   budesonide-formoterol (SYMBICORT) 160-4.5 MCG/ACT inhaler [id number] No Inhale 2 puffs into the lungs 2 (two) times daily. [name], MD Taking Active   buPROPion (WELLBUTRIN XL) 300 MG 24 hr tablet [id number] No Take 300 mg by mouth daily.  [provider] Taking Active Self  busPIRone (BUSPAR) 10 MG tablet [id number] No Take 10 mg by mouth 2 (two) times daily. [provider] Taking Active Self           Med Note [name], [facility] I   Wed Feb 18, 2018 10:03 PM)    Cholecalciferol (VITAMIN D3) 10 MCG (400 UNIT) CAPS [id number] No Take 400 Int'l Units/day by mouth daily. [name], MD Taking Active   cyclobenzaprine (FLEXERIL) 10 MG tablet [id number]  TAKE 1 TABLET (10 MG TOTAL) BY MOUTH 3 (THREE) TIMES DAILY AS NEEDED FOR MUSCLE SPASMS. [name], MD  Active   doxepin (SINEQUAN) 50 MG capsule [id number] No Take 150 mg by mouth at bedtime. [provider] Taking Active Self  ELIQUIS 5 MG TABS tablet [id number]  TAKE ONE TABLET BY MOUTH TWICE DAILY MORNING AND [name], MD  Active   furosemide (LASIX) 20 MG tablet [id number]  TAKE 1 TABLET (20 MG TOTAL) BY MOUTH DAILY (AM) [name], MD  Active   gabapentin (NEURONTIN) 300 MG capsule [id number]  TAKE ONE CAPSULE BY MOUTH THREE TIMES DAILY (AM+NOON+BEDTIME) [name], MD  Active   loratadine (CLARITIN) 10 MG tablet [id number]  TAKE 1 TABLET (10 MG TOTAL) BY MOUTH DAILY (AM) [name], MD  Active    metoprolol tartrate (LOPRESSOR) 50 MG tablet [id number]  TAKE ONE TABLET BY MOUTH TWICE DAILY (AM+BEDTIME) [name], MD  Active   montelukast (SINGULAIR) 10 MG tablet [id number]  TAKE 1 TABLET BY MOUTH EVERY EVENING AT BEDTIME [name], MD  Active   OZEMPIC, 1 MG/DOSE, 4 MG/3ML SOPN [id number] No Inject 1 mg into the skin once a week. [provider] Taking Active   prazosin (MINIPRESS) 2 MG capsule [id number] No Take 6 mg by mouth at bedtime. [provider] Taking Active Self  SYNTHROID 200 MCG tablet [id number] No Take 1 tablet (200 mcg total) by mouth daily before breakfast. [name], MD Taking Active   topiramate (TOPAMAX) 200 MG tablet [id number]  TAKE 1 TABLET (200 MG TOTAL) BY MOUTH AT BEDTIME. [name], DO  Active   VRAYLAR 1.5 MG capsule [id number] No Take 1.5 mg by mouth daily. [provider] Taking Active            Patient Active Problem List   Diagnosis Date Noted   Postoperative hypothyroidism 01/21/2022   Esophageal dysphagia 01/21/2022   History of DVT (deep vein thrombosis) 01/21/2022   History of malignant neoplasm of  thyroid 01/21/2022   Long term (current) use of anticoagulants 01/21/2022   Migraine without aura, not refractory 01/21/2022   Type 2 diabetes mellitus with hyperglycemia ([place]) 01/21/2022   Spinal stenosis excluding cervical region 11/21/2021   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   Abdominal pannus 08/21/2021   Hardening of the aorta (main artery of the heart) ([place]) 08/21/2021   At high risk for falls 04/20/2020   Hyperlipidemia 02/16/2020   Adrenal mass, right ([place]) 04/29/2019   Chronic cough 03/12/2018   Left shoulder pain 10/14/2016   Primary hypertension 10/14/2016   S/P thyroidectomy 06/27/2016   Chronic bilateral low back pain without sciatica 04/22/2016   Liver lesion 12/24/2015   Malignant tumor of thyroid gland ([place]) 11/16/2015   Controlled type 2 diabetes mellitus with  complication, with long-term current use of insulin ([place]) 10/10/2015   Vision loss 08/25/2015   Decreased vision in both eyes 08/25/2015   Prolonged Q-T interval on ECG 08/19/2015   History of pulmonary embolism 08/10/2015   Mild persistent asthma 04/13/2015   Nonruptured cerebral aneurysm 04/13/2015   Fibroid, uterine    Morbid obesity ([place]) 11/11/2014   Atypical chest pain 11/08/2014   Sinus tachycardia 11/08/2014   Migraine 10/14/2014   Menorrhagia 09/08/2014   Conditions to be addressed/monitored per PCP order:  Chronic healthcare management needs, DM, HTN, chronic pain, migraines, anxiety/depression,asthma, HLD  Care Plan : General Plan of Care (Adult)  Updates made by Gayla Medicus, RN since 07/05/2022 12:00 AM     Problem: Health Promotion or Disease Self-Management (General Plan of Care)   Priority: Medium  Onset Date: 10/20/2020     Long-Range Goal: Self-Management Plan Developed   Start Date: 07/14/2020  Expected End Date: 10/04/2022  Recent Progress: On track  Priority: Medium  Note:    Current Barriers:  Chronic Disease Management support and education needs related to DM, HTN, chronic pain, migraines, asthma, HLD Patient with anxiety and depression-has appointment with Psychiatrist  scheduled every 3 months, therapist every 2 weeks 07/05/22:   Patient with no complaints today other than continued hypoglycemic episodes weekly for which she eats a snack and/or Glucerna-has upcoming appt with ENDO and PCP and will discuss.   Plans on starting school this month [employer]- will take 1.5 years-program through [place].  Completed aquatic therapy.  Nurse Case Manager Clinical Goal(s):  Over the next 90 days, patient will attend all scheduled medical appointments:  Over the next 30 days, patient will work with CM team pharmacist to review medications Interventions:  Inter-disciplinary care team collaboration (see longitudinal plan of care) Evaluation of  current treatment plan  and patient's adherence to plan as established by provider. Reviewed medications with patient. Collaborated with pharmacy regarding medications. Discussed plans with patient for ongoing care management follow up and provided patient with direct contact information for care management team Reviewed scheduled/upcoming provider appointments. Collaborated with SW for psychotherapy referral. SW referral for psychotherapy provider-completed Pharmacy referral for medication review-completed Collaborated with PCP for new BP cuff Self Care Activities: Over the next 90 days, patient will:  -Self administers medications as prescribed Attends all scheduled provider appointments Calls pharmacy for medication refills Calls provider office for new concerns or questions Patient will locate meter to check blood sugars Schedule eye provider appt. Follow up with provider regarding blood sugars  Follow Up Plan: The Managed Medicaid care management team will reach out to the patient again over the next 30  business days.  The patient has been  provided with contact information for the Managed Medicaid care management team and has been advised to call with any health related questions or concerns.    Follow Up:  Patient agrees to Care Plan and Follow-up.  Plan: The Managed Medicaid care management team will reach out to the patient again over the next 30 business  days. and The  Patient has been provided with contact information for the Managed Medicaid care management team and has been advised to call with any health related questions or concerns.  Date/time of next scheduled RN care management/care coordination outreach:  08/02/22 at 0900

## 2022-07-05 NOTE — Patient Instructions (Signed)
Hi Ms. Torpey to catch up with you this afternoon, have a great weekend!!  Ms. Sweeting was given information about Medicaid Managed Care team care coordination services as a part of their [place] Medicaid benefit. [name] verbally consented to engagement with the [facility] Managed Care team.   If you are experiencing a medical emergency, please call 911 or report to your local emergency department or urgent care.   If you have a non-emergency medical problem during routine business hours, please contact your provider's office and ask to speak with a nurse.   For questions related to your [facility], please call: [phone] or visit the homepage here: [email or web address]  If you would like to schedule transportation through your [facility], please call the following number at least 2 days in advance of your appointment: [phone]   Rides for urgent appointments can also be made after hours by calling Member Services.  Call the [place] at [phone], at any time, 24 hours a day, 7 days a week. If you are in danger or need immediate medical attention call 911.  If you would like help to quit smoking, call 1-800-QUIT-NOW 616-212-0582) OR Espaol: 1-855-Djelo-Ya (2-440-102-7253) o para ms informacin haga clic aqu or Text READY to 200-400 to register via text  Ms. Bennetts - following are the goals we discussed in your visit today:   Goals Addressed             This Visit's Progress    Protect My Health         Timeframe:  Long-Range Goal Priority:  Medium Start Date:    07/14/20                         Expected End Date:  ongoing          Follow Up Date: 08/02/22 - schedule appointment for vaccines needed due to my age or health - schedule recommended health tests (blood work, mammogram, colonoscopy, pap test) - schedule  and keep appointment for annual check-up  07/05/22:  Patient has upcoming appts with PCP and ENDO in February.  Psychiatrist in March.   Patient verbalizes understanding of instructions and care plan provided today and agrees to view in [place]. Active MyChart status and patient understanding of how to access instructions and care plan via MyChart confirmed with patient.     The Managed Medicaid care management team will reach out to the patient again over the next 30 business  days.  The  Patient  has been provided with contact information for the Managed Medicaid care management team and has been advised to call with any health related questions or concerns.   Aida Raider RN, BSN [place] Management Coordinator - Managed Medicaid High Risk [phone]   Following is a copy of your plan of care:  Care Plan : General Plan of Care (Adult)  Updates made by Gayla Medicus, RN since 07/05/2022 12:00 AM     Problem: Health Promotion or Disease Self-Management (General Plan of Care)   Priority: Medium  Onset Date: 10/20/2020     Long-Range Goal: Self-Management Plan Developed   Start Date: 07/14/2020  Expected End Date: 10/04/2022  Recent Progress: On track  Priority: Medium  Note:    Current Barriers:  Chronic Disease Management support and education needs related to DM, HTN, chronic pain, migraines, asthma, HLD  Patient with anxiety and depression-has appointment with Psychiatrist  scheduled every 3 months, therapist every 2 weeks 07/05/22:   Patient with no complaints today other than continued hypoglycemic episodes weekly for which she eats a snack and/or Glucerna-has upcoming appt with ENDO and PCP and will discuss.   Plans on starting school this month [employer]- will take 1.5 years-program through [place].  Completed aquatic therapy.  Nurse Case Manager Clinical Goal(s):  Over the next 90 days, patient will attend all scheduled  medical appointments:  Over the next 30 days, patient will work with CM team pharmacist to review medications Interventions:  Inter-disciplinary care team collaboration (see longitudinal plan of care) Evaluation of current treatment plan  and patient's adherence to plan as established by provider. Reviewed medications with patient. Collaborated with pharmacy regarding medications. Discussed plans with patient for ongoing care management follow up and provided patient with direct contact information for care management team Reviewed scheduled/upcoming provider appointments. Collaborated with SW for psychotherapy referral. SW referral for psychotherapy provider-completed Pharmacy referral for medication review-completed Collaborated with PCP for new BP cuff Self Care Activities: Over the next 90 days, patient will:  -Self administers medications as prescribed Attends all scheduled provider appointments Calls pharmacy for medication refills Calls provider office for new concerns or questions Patient will locate meter to check blood sugars Schedule eye provider appt. Follow up with provider regarding blood sugars  Follow Up Plan: The Managed Medicaid care management team will reach out to the patient again over the next 30  business days.  The patient has been provided with contact information for the Managed Medicaid care management team and has been advised to call with any health related questions or concerns.

## 2022-07-16 DIAGNOSIS — F4325 Adjustment disorder with mixed disturbance of emotions and conduct: Secondary | ICD-10-CM | POA: Diagnosis not present

## 2022-07-23 DIAGNOSIS — F4325 Adjustment disorder with mixed disturbance of emotions and conduct: Secondary | ICD-10-CM | POA: Diagnosis not present

## 2022-07-29 ENCOUNTER — Other Ambulatory Visit: Payer: Self-pay | Admitting: Internal Medicine

## 2022-07-29 ENCOUNTER — Other Ambulatory Visit: Payer: Self-pay | Admitting: Family Medicine

## 2022-07-29 DIAGNOSIS — E118 Type 2 diabetes mellitus with unspecified complications: Secondary | ICD-10-CM

## 2022-07-29 DIAGNOSIS — J4540 Moderate persistent asthma, uncomplicated: Secondary | ICD-10-CM

## 2022-07-30 NOTE — Telephone Encounter (Signed)
Requested medication (s) are due for refill today: Yes  Requested medication (s) are on the active medication list: Yes  Last refill:    Future visit scheduled: No  Notes to clinic:  Pt. Still under CHW care?    Requested Prescriptions  Pending Prescriptions Disp Refills   loratadine (CLARITIN) 10 MG tablet [Pharmacy Med Name: LORATADINE 10 MG ORAL TABLET] 30 tablet 3    Sig: TAKE 1 TABLET (10 MG TOTAL) BY MOUTH DAILY (AM)     Ear, Nose, and Throat:  Antihistamines 2 Passed - 07/29/2022  5:50 PM      Passed - Cr in normal range and within 360 days    Creat  Date Value Ref Range Status  06/27/2016 0.72 0.50 - 1.10 mg/dL Final   Creatinine, Ser  Date Value Ref Range Status  02/01/2022 0.86 0.44 - 1.00 mg/dL Final   Creatinine, Urine  Date Value Ref Range Status  08/25/2015 57 20 - 320 mg/dL Final         Passed - Valid encounter within last 12 months    Recent Outpatient Visits           7 months ago Type 2 diabetes mellitus with morbid obesity ([place])   [place] [name] B, MD   11 months ago Type 2 diabetes mellitus with morbid obesity [facility])   [place] [name], MD   1 year ago Bulging lumbar disc   [place], [place], [name]   1 year ago Type 2 diabetes mellitus with morbid obesity [facility])   [place] [name], MD   1 year ago Poor sleep   [place], MD               SYNTHROID 200 MCG tablet [Pharmacy Med Name: SYNTHROID 200 MCG ORAL TABLET] 30 tablet 4    Sig: Take 1 tablet (200 mcg total) by mouth daily before breakfast.     Endocrinology:  Hypothyroid Agents Failed - 07/29/2022  5:50 PM      Failed - TSH in normal range and within 360 days    TSH  Date Value Ref Range Status  05/06/2017 0.867 0.450 - 4.500 uIU/mL Final          Passed - Valid encounter within last 12 months    Recent Outpatient Visits           7 months ago Type 2 diabetes mellitus with morbid obesity ([place])   [place] [name] B, MD   11 months ago Type 2 diabetes mellitus with morbid obesity [facility])   [place] [name], MD   1 year ago Bulging lumbar disc   [place], [place], [name]   1 year ago Type 2 diabetes mellitus with morbid obesity [facility])   [place] [name], MD   1 year ago Poor sleep   [place] [name], MD               Cholecalciferol (VITAMIN D3) 10 MCG (400 UNIT) CAPS [Pharmacy Med Name: VITAMIN D3 10 MCG (400 UNIT) ORAL CAPSULE] 100 capsule 2    Sig: TAKE ONE  CAPSULE BY MOUTH ONCE DAILY (AM)     Endocrinology:  Vitamins - Vitamin D Supplementation 2 Failed - 07/29/2022  5:50 PM      Failed - Manual Review: Route requests for 50,000 IU strength to the provider      Failed - Vitamin D in normal range and within 360 days    No results found for: "OJ5009FG1", "[id number]", "VD125OH2TOT", "25OHVITD3", "25OHVITD2", "67ELFYBO1", "VD25OH"       Passed - Ca in normal range and within 360 days    Calcium  Date Value Ref Range Status  02/01/2022 9.0 8.9 - 10.3 mg/dL Final   Calcium, Ion  Date Value Ref Range Status  05/11/2021 1.02 (L) 1.15 - 1.40 mmol/L Final         Passed - Valid encounter within last 12 months    Recent Outpatient Visits           7 months ago Type 2 diabetes mellitus with morbid obesity ([place])   [place] [name] B, MD   11 months ago Type 2 diabetes mellitus with morbid obesity [facility])   [place] [name], MD   1 year ago Bulging lumbar disc   [place], [place], [name]   1 year ago Type 2 diabetes mellitus with morbid obesity [facility])   [place] [name], MD   1 year ago Poor sleep   [place] [name], MD               SYMBICORT 160-4.5 MCG/ACT inhaler [Pharmacy Med Name: SYMBICORT 160-4.5 MCG/ACT INHALATION AEROSOL]      Sig: INHALE 2 PUFFS INTO THE LUNGS 2 (TWO) TIMES DAILY.     Pulmonology:  Combination Products Passed - 07/29/2022  5:50 PM      Passed - Valid encounter within last 12 months    Recent Outpatient Visits           7 months ago Type 2 diabetes mellitus with morbid obesity [facility])   [place] [name] B, MD   11 months ago Type 2 diabetes mellitus with morbid obesity [facility])   [place] [name], MD   1 year ago Bulging lumbar disc   [place], [place], [name]   1 year ago Type 2 diabetes mellitus with morbid obesity [facility]   [place] [name], MD   1 year ago Poor sleep   [place] [name], MD

## 2022-07-30 NOTE — Telephone Encounter (Signed)
Requested medication (s) are due for refill today: Yes  Requested medication (s) are on the active medication list: Yes  Last refill:    Future visit scheduled: No  Notes to clinic:  Is pt. Still under CHW?    Requested Prescriptions  Pending Prescriptions Disp Refills   atorvastatin (LIPITOR) 20 MG tablet [Pharmacy Med Name: ATORVASTATIN CALCIUM 20 MG ORAL TABLET] 30 tablet 0    Sig: TAKE ONE TABLET BY MOUTH ONCE DAILY AT BEDTIME     Cardiovascular:  Antilipid - Statins Failed - 07/29/2022  5:49 PM      Failed - Lipid Panel in normal range within the last 12 months    Cholesterol, Total  Date Value Ref Range Status  04/20/2020 130 100 - 199 mg/dL Final   LDL Chol Calc (NIH)  Date Value Ref Range Status  04/20/2020 68 0 - 99 mg/dL Final   HDL  Date Value Ref Range Status  04/20/2020 44 >39 mg/dL Final   Triglycerides  Date Value Ref Range Status  04/20/2020 95 0 - 149 mg/dL Final         Passed - Patient is not pregnant      Passed - Valid encounter within last 12 months    Recent Outpatient Visits           7 months ago Type 2 diabetes mellitus with morbid obesity ([place])   [place] [name] B, MD   11 months ago Type 2 diabetes mellitus with morbid obesity [facility])   [place] [name], MD   1 year ago Bulging lumbar disc   [place], [place], [name]   1 year ago Type 2 diabetes mellitus with morbid obesity [facility])   [place] [name], MD   1 year ago Poor sleep   [place] [name], MD

## 2022-07-31 ENCOUNTER — Telehealth: Payer: Self-pay

## 2022-07-31 NOTE — Telephone Encounter (Signed)
Patient is calling in requesting a work in appointment as she feels things have changed significantly since last visit and feels she needs to touch bases with Dr.Jaffe. Next available is not until June.

## 2022-08-01 NOTE — Telephone Encounter (Signed)
Patient is scheduled   

## 2022-08-02 ENCOUNTER — Other Ambulatory Visit: Payer: Self-pay | Admitting: Family Medicine

## 2022-08-02 ENCOUNTER — Encounter: Payer: Self-pay | Admitting: Obstetrics and Gynecology

## 2022-08-02 ENCOUNTER — Other Ambulatory Visit: Payer: Medicaid Other | Admitting: Obstetrics and Gynecology

## 2022-08-02 DIAGNOSIS — Z794 Long term (current) use of insulin: Secondary | ICD-10-CM

## 2022-08-02 NOTE — Telephone Encounter (Signed)
Unable to refill per protocol, patient has another PCP listed in chart. Will refuse duplicate request.  Requested Prescriptions  Pending Prescriptions Disp Refills   atorvastatin (LIPITOR) 20 MG tablet 30 tablet 0    Sig: Take 1 tablet (20 mg total) by mouth daily with breakfast.     Cardiovascular:  Antilipid - Statins Failed - 08/02/2022  1:21 PM      Failed - Lipid Panel in normal range within the last 12 months    Cholesterol, Total  Date Value Ref Range Status  04/20/2020 130 100 - 199 mg/dL Final   LDL Chol Calc (NIH)  Date Value Ref Range Status  04/20/2020 68 0 - 99 mg/dL Final   HDL  Date Value Ref Range Status  04/20/2020 44 >39 mg/dL Final   Triglycerides  Date Value Ref Range Status  04/20/2020 95 0 - 149 mg/dL Final         Passed - Patient is not pregnant      Passed - Valid encounter within last 12 months    Recent Outpatient Visits           7 months ago Type 2 diabetes mellitus with morbid obesity ([place])   [place] [name] B, MD   11 months ago Type 2 diabetes mellitus with morbid obesity [facility])   [place] [name], MD   1 year ago Bulging lumbar disc   [place], [place], [name]   1 year ago Type 2 diabetes mellitus with morbid obesity [facility])   [place] [name], MD   1 year ago Poor sleep   [place] [name], MD

## 2022-08-02 NOTE — Patient Outreach (Signed)
Medicaid Managed Care   Nurse Care Manager Note  08/02/2022 Name:  Melinda [name] MRN:  [id number] DOB:  1965-09-12  Melinda [name] is an 57 y.o. year old female who is a primary patient of [place], [place], [place].  The [facility] Managed Care Coordination team was consulted for assistance with:    Chronic healthcare management needs, DM, HTN, chronic pain, migraines, anxiety/depression, asthma, HLD , neuropathy  Ms. Santori was given information about Medicaid Managed Care Coordination team services today. Melinda [name] Patient agreed to services and verbal consent obtained.  Engaged with patient by telephone for follow up visit in response to provider referral for case management and/or care coordination services.   Assessments/Interventions:  Review of past medical history, allergies, medications, health status, including review of consultants reports, laboratory and other test data, was performed as part of comprehensive evaluation and provision of chronic care management services.  SDOH (Social Determinants of Health) assessments and interventions performed: SDOH Interventions    Flowsheet Row Patient Outreach Telephone from 08/02/2022 in [place] Patient Outreach Telephone from 07/05/2022 in [place] Patient Outreach Telephone from 06/03/2022 in [place] Patient Outreach Telephone from 05/01/2022 in [place] Patient Outreach Telephone from 01/15/2022 in [place] Patient Outreach Telephone from 01/08/2022 in [place] Coordination  SDOH Interventions        Food Insecurity Interventions Intervention Not Indicated -- -- -- -- Intervention Not Indicated  Housing Interventions -- -- Intervention Not Indicated -- -- --  Transportation Interventions -- Intervention Not Indicated -- -- -- Intervention  Not Indicated  Utilities Interventions -- -- -- Intervention Not Indicated -- --  Depression Interventions/Treatment  Currently on Treatment -- -- -- -- --  Financial Strain Interventions -- -- -- Intervention Not Indicated -- --  Physical Activity Interventions -- -- -- -- --  [not able to engage in physical activity] --  Stress Interventions -- Other (Comment)  [sees Psychiatrist on a regular basis on medication] -- -- -- --  Social Connections Interventions -- -- Intervention Not Indicated -- -- --     Care Plan  Allergies  Allergen Reactions   Ceftriaxone Anaphylaxis and Other (See Comments)    ROCEPHIN   Metformin Anaphylaxis    ALL   Penicillins Shortness Of Breath    Has patient had a PCN reaction causing immediate rash, facial/tongue/throat swelling, SOB or lightheadedness with hypotension: Yes Has patient had a PCN reaction causing severe rash involving mucus membranes or skin necrosis: No Has patient had a PCN reaction that required hospitalization No Has patient had a PCN reaction occurring within the last 10 years: No If all of the above answers are "NO", then may proceed with Cephalosporin use.    Shellfish Allergy Anaphylaxis   Flonase [Fluticasone Propionate]     Makes migraines worse   Gold-Containing Drug Products     HANDS ITCH   Metformin Hcl Other (See Comments)   Nickel     HANDS SWELL   Penicillin G Other (See Comments)   Citrus Rash   Medications Reviewed Today     Reviewed by Gayla Medicus, RN (Registered Nurse) on 08/02/22 at [place] List Status: <None>   Medication Order Taking? Sig Documenting Provider Last Dose Status Informant  acetaminophen (TYLENOL) 500 MG tablet [id number] No Take 1 tablet (500 mg total) by mouth every 6 (six) hours as needed.  Jessy Oto, MD Taking Active   AIMOVIG 140 MG/ML SOAJ [id number]  INJECT 140 MG INTO THE SKIN EVERY 28 (TWENTY-EIGHT) DAYS. [name], DO  Active   albuterol (PROVENTIL) (2.5 MG/3ML) 0.083%  nebulizer solution [id number] No Take 3 mLs (2.5 mg total) by nebulization every 6 (six) hours as needed for wheezing or shortness of breath. [name], MD Taking Active Self  albuterol (VENTOLIN HFA) 108 (90 Base) MCG/ACT inhaler [id number] No Inhale 2 puffs into the lungs every 6 (six) hours as needed for wheezing or shortness of breath. [name], MD Taking Active Self  atorvastatin (LIPITOR) 20 MG tablet [id number]  TAKE ONE TABLET BY MOUTH ONCE DAILY AT BEDTIME Charlott Rakes, MD  Active   budesonide-formoterol (SYMBICORT) 160-4.5 MCG/ACT inhaler [id number] No Inhale 2 puffs into the lungs 2 (two) times daily. [name], MD Taking Active   buPROPion (WELLBUTRIN XL) 300 MG 24 hr tablet [id number] No Take 300 mg by mouth daily.  [provider] Taking Active Self  busPIRone (BUSPAR) 10 MG tablet [id number] No Take 10 mg by mouth 2 (two) times daily. [provider] Taking Active Self           Med Note [name], [facility]   Wed Feb 18, 2018 10:03 PM)    Cholecalciferol (VITAMIN D3) 10 MCG (400 UNIT) CAPS [id number] No Take 400 Int'l Units/day by mouth daily. [name], MD Taking Active   cyclobenzaprine (FLEXERIL) 10 MG tablet [id number]  TAKE 1 TABLET (10 MG TOTAL) BY MOUTH 3 (THREE) TIMES DAILY AS NEEDED FOR MUSCLE SPASMS. [name], MD  Active   doxepin (SINEQUAN) 50 MG capsule [id number] No Take 150 mg by mouth at bedtime. [provider] Taking Active Self  ELIQUIS 5 MG TABS tablet [id number]  TAKE ONE TABLET BY MOUTH TWICE DAILY MORNING AND [name], MD  Active   furosemide (LASIX) 20 MG tablet [id number]  TAKE 1 TABLET (20 MG TOTAL) BY MOUTH DAILY (AM) [name], MD  Active   gabapentin (NEURONTIN) 300 MG capsule [id number]  TAKE ONE CAPSULE BY MOUTH THREE TIMES DAILY (AM+NOON+BEDTIME) [name], MD  Active   loratadine (CLARITIN) 10 MG tablet [id number]  TAKE 1 TABLET (10 MG TOTAL) BY MOUTH  DAILY (AM) [name], MD  Active   metoprolol tartrate (LOPRESSOR) 50 MG tablet [id number]  TAKE ONE TABLET BY MOUTH TWICE DAILY (AM+BEDTIME) [name], MD  Active   montelukast (SINGULAIR) 10 MG tablet [id number]  TAKE 1 TABLET BY MOUTH EVERY EVENING AT BEDTIME [name], MD  Active   OZEMPIC, 1 MG/DOSE, 4 MG/3ML SOPN [id number] No Inject 1 mg into the skin once a week. [provider] Taking Active   prazosin (MINIPRESS) 2 MG capsule [id number] No Take 6 mg by mouth at bedtime. [provider] Taking Active Self  SYNTHROID 200 MCG tablet [id number] No Take 1 tablet (200 mcg total) by mouth daily before breakfast. [name], MD Taking Active   topiramate (TOPAMAX) 200 MG tablet [id number]  TAKE 1 TABLET (200 MG TOTAL) BY MOUTH AT BEDTIME. [name], DO  Active   VRAYLAR 1.5 MG capsule [id number] No Take 1.5 mg by mouth daily. [provider] Taking Active            Patient Active Problem List   Diagnosis Date Noted   Postoperative hypothyroidism 01/21/2022   Esophageal dysphagia 01/21/2022   History of DVT (deep  vein thrombosis) 01/21/2022   History of malignant neoplasm of thyroid 01/21/2022   Long term (current) use of anticoagulants 01/21/2022   Migraine without aura, not refractory 01/21/2022   Type 2 diabetes mellitus with hyperglycemia ([place]) 01/21/2022   Spinal stenosis excluding cervical region 11/21/2021   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   Abdominal pannus 08/21/2021   Hardening of the aorta (main artery of the heart) ([place]) 08/21/2021   At high risk for falls 04/20/2020   Hyperlipidemia 02/16/2020   Adrenal mass, right ([place]) 04/29/2019   Chronic cough 03/12/2018   Left shoulder pain 10/14/2016   Primary hypertension 10/14/2016   S/P thyroidectomy 06/27/2016   Chronic bilateral low back pain without sciatica 04/22/2016   Liver lesion 12/24/2015   Malignant tumor of thyroid gland ([place]) 11/16/2015    Controlled type 2 diabetes mellitus with complication, with long-term current use of insulin ([place]) 10/10/2015   Vision loss 08/25/2015   Decreased vision in both eyes 08/25/2015   Prolonged Q-T interval on ECG 08/19/2015   History of pulmonary embolism 08/10/2015   Mild persistent asthma 04/13/2015   Nonruptured cerebral aneurysm 04/13/2015   Fibroid, uterine    Morbid obesity ([place]) 11/11/2014   Atypical chest pain 11/08/2014   Sinus tachycardia 11/08/2014   Migraine 10/14/2014   Menorrhagia 09/08/2014   Conditions to be addressed/monitored per PCP order:  Chronic healthcare management needs, DM, HTN, chronic pain, migraines, anxiety/depression, asthma, HLD  Care Plan : General Plan of Care (Adult)  Updates made by Gayla Medicus, RN since 08/02/2022 12:00 AM     Problem: Health Promotion or Disease Self-Management (General Plan of Care)   Priority: Medium  Onset Date: 10/20/2020     Long-Range Goal: Chronic Disease Management   Start Date: 07/14/2020  Expected End Date: 10/04/2022  Recent Progress: On track  Priority: Medium  Note:    Current Barriers:  Chronic Disease Management support and education needs related to DM, HTN, chronic pain, migraines, asthma, HLD, anxiety, depression Patient with anxiety and depression-has appointment with Psychiatrist  scheduled every 3 months, therapist every 2 weeks 08/02/22:  Patient with no more hypoglycemic episodes. Blood sugars and blood pressure stable.  Breathing WNL and pain controlled. Patient needs hearing aids for both ears-$1800-she will follow up with them.  Starts school 2/6.  Upcoming sleep study. Nurse Case Manager Clinical Goal(s):  Over the next 90 days, patient will attend all scheduled medical appointments:  Over the next 30 days, patient will work with CM team pharmacist to review medications Interventions:  Inter-disciplinary care team collaboration (see longitudinal plan of care) Evaluation of current treatment plan  and  patient's adherence to plan as established by provider. Reviewed medications with patient. Collaborated with pharmacy regarding medications. Discussed plans with patient for ongoing care management follow up and provided patient with direct contact information for care management team Reviewed scheduled/upcoming provider appointments. Collaborated with SW for psychotherapy referral. SW referral for psychotherapy provider-completed Pharmacy referral for medication review-completed Collaborated with PCP for new BP cuff-completed  Asthma: (Status:New goal.) Long Term Goal Advised patient to track and manage Asthma triggers Provided education about and advised patient to utilize infection prevention strategies to reduce risk of respiratory infection Discussed the importance of adequate rest and management of fatigue with Asthma Screening for signs and symptoms of depression related to chronic disease state  Assessed social determinant of health barriers   Diabetes Interventions:  (Status:  New goal.) Long Term Goal Assessed patient's understanding of A1c goal: <7%  Reviewed medications with patient and discussed importance of medication adherence Discussed plans with patient for ongoing care management follow up and provided patient with direct contact information for care management team Reviewed scheduled/upcoming provider appointments  Advised patient, providing education and rationale, to check cbg as directed  and record, calling provider  for findings outside established parameters Review of patient status, including review of consultants reports, relevant laboratory and other test results, and medications completed Screening for signs and symptoms of depression related to chronic disease state  Assessed social determinant of health barriers Lab Results  Component Value Date   HGBA1C 6.1 04/20/2021  Hyperlipidemia Interventions:  (Status:  New goal.) Long Term Goal Medication review  performed; medication list updated in electronic medical record.  Provider established cholesterol goals reviewed Counseled on importance of regular laboratory monitoring as prescribed Reviewed importance of limiting foods high in cholesterol Screening for signs and symptoms of depression related to chronic disease state Assessed social determinant of health barriers   Hypertension Interventions:  (Status:  New goal.) Long Term Goal Last practice recorded BP readings:  BP Readings from Last 3 Encounters:  02/01/22 111/77  01/24/22 128/83  01/21/22 104/73  Most recent eGFR/CrCl:  Lab Results  Component Value Date   EGFR 68 12/19/2020    No components found for: "CRCL"  Evaluation of current treatment plan related to hypertension self management and patient's adherence to plan as established by provider Reviewed medications with patient and discussed importance of compliance Discussed plans with patient for ongoing care management follow up and provided patient with direct contact information for care management team Advised patient, providing education and rationale, to monitor blood pressure daily and record, calling PCP for findings outside established parameters Reviewed scheduled/upcoming provider appointments including:  Screening for signs and symptoms of depression related to chronic disease state  Assessed social determinant of health barriers  Pain Interventions:  (Status:  New goal.) Long Term Goal Pain assessment performed Medications reviewed Reviewed provider established plan for pain management Discussed importance of adherence to all scheduled medical appointments Counseled on the importance of reporting any/all new or changed pain symptoms or management strategies to pain management provider Advised patient to report to care team affect of pain on daily activities Reviewed with patient prescribed pharmacological and nonpharmacological pain relief strategies Screening  for signs and symptoms of depression related to chronic disease state  Assessed social determinant of health barriers   Self Care Activities: Over the next 90 days, patient will:  -Self administers medications as prescribed Attends all scheduled provider appointments Calls pharmacy for medication refills Calls provider office for new concerns or questions Patient will locate meter to check blood sugars-completed Schedule eye provider appt. Follow up with provider regarding blood sugars  Follow Up Plan: The Managed Medicaid care management team will reach out to the patient again over the next 30  business days.  The patient has been provided with contact information for the Managed Medicaid care management team and has been advised to call with any health related questions or concerns.    Follow Up:  Patient agrees to Care Plan and Follow-up.  Plan: The Managed Medicaid care management team will reach out to the patient again over the next 30 business  days. and The  Patient has been provided with contact information for the Managed Medicaid care management team and has been advised to call with any health related questions or concerns.  Date/time of next scheduled RN care management/care coordination outreach: 09/02/22 at  315 

## 2022-08-02 NOTE — Patient Instructions (Signed)
Hi Melinda [name], thanks for updating me on everything-have a great day and weekend!!  Melinda [name] was given information about Medicaid Managed Care team care coordination services as a part of their [place] Medicaid benefit. Melinda [name] verbally consented to engagement with the [facility] Managed Care team.   If you are experiencing a medical emergency, please call 911 or report to your local emergency department or urgent care.   If you have a non-emergency medical problem during routine business hours, please contact your provider's office and ask to speak with a nurse.   For questions related to your [facility], please call: [phone] or visit the homepage here: [email or web address]  If you would like to schedule transportation through your [facility], please call the following number at least 2 days in advance of your appointment: [phone]   Rides for urgent appointments can also be made after hours by calling Member Services.  Call the [place] at [phone], at any time, 24 hours a day, 7 days a week. If you are in danger or need immediate medical attention call 911.  If you would like help to quit smoking, call 1-800-QUIT-NOW 205-828-9023) OR Espaol: 1-855-Djelo-Ya (5-400-867-6195) o para ms informacin haga clic aqu or Text READY to 200-400 to register via text  Melinda [name] - following are the goals we discussed in your visit today:   Goals Addressed             This Visit's Progress    Protect My Health        Timeframe:  Long-Range Goal Priority:  Medium Start Date:    07/14/20                         Expected End Date:  ongoing          Follow Up Date: 09/02/22 - schedule appointment for vaccines needed due to my age or health - schedule recommended health tests (blood work, mammogram, colonoscopy, pap test) - schedule  and keep appointment for annual check-up  08/02/22:  Patient to see ENDO and PCP in February, Podiatry in March   Patient verbalizes understanding of instructions and care plan provided today and agrees to view in [place]. Active MyChart status and patient understanding of how to access instructions and care plan via MyChart confirmed with patient.     The Managed Medicaid care management team will reach out to the patient again over the next 30 business  days.  The  Patient has been provided with contact information for the Managed Medicaid care management team and has been advised to call with any health related questions or concerns.   Aida Raider RN, BSN [place] Management Coordinator - Managed Medicaid High Risk [phone]   Following is a copy of your plan of care:  Care Plan : General Plan of Care (Adult)  Updates made by Gayla Medicus, RN since 08/02/2022 12:00 AM     Problem: Health Promotion or Disease Self-Management (General Plan of Care)   Priority: Medium  Onset Date: 10/20/2020     Long-Range Goal: Chronic Disease Management   Start Date: 07/14/2020  Expected End Date: 10/04/2022  Recent Progress: On track  Priority: Medium  Note:    Current Barriers:  Chronic Disease Management support and education needs related to DM, HTN, chronic pain, migraines, asthma, HLD, anxiety, depression Patient with anxiety  and depression-has appointment with Psychiatrist  scheduled every 3 months, therapist every 2 weeks 08/02/22:  Patient with no more hypoglycemic episodes. Blood sugars and blood pressure stable.  Breathing WNL and pain controlled. Patient needs hearing aids for both ears-$1800-she will follow up with them.  Starts school 2/6.  Upcoming sleep study. Nurse Case Manager Clinical Goal(s):  Over the next 90 days, patient will attend all scheduled medical appointments:  Over the next 30 days, patient will work with CM team pharmacist to review  medications Interventions:  Inter-disciplinary care team collaboration (see longitudinal plan of care) Evaluation of current treatment plan  and patient's adherence to plan as established by provider. Reviewed medications with patient. Collaborated with pharmacy regarding medications. Discussed plans with patient for ongoing care management follow up and provided patient with direct contact information for care management team Reviewed scheduled/upcoming provider appointments. Collaborated with SW for psychotherapy referral. SW referral for psychotherapy provider-completed Pharmacy referral for medication review-completed Collaborated with PCP for new BP cuff-completed  Asthma: (Status:New goal.) Long Term Goal Advised patient to track and manage Asthma triggers Provided education about and advised patient to utilize infection prevention strategies to reduce risk of respiratory infection Discussed the importance of adequate rest and management of fatigue with Asthma Screening for signs and symptoms of depression related to chronic disease state  Assessed social determinant of health barriers   Diabetes Interventions:  (Status:  New goal.) Long Term Goal Assessed patient's understanding of A1c goal: <7% Reviewed medications with patient and discussed importance of medication adherence Discussed plans with patient for ongoing care management follow up and provided patient with direct contact information for care management team Reviewed scheduled/upcoming provider appointments  Advised patient, providing education and rationale, to check cbg as directed  and record, calling provider  for findings outside established parameters Review of patient status, including review of consultants reports, relevant laboratory and other test results, and medications completed Screening for signs and symptoms of depression related to chronic disease state  Assessed social determinant of health  barriers Lab Results  Component Value Date   HGBA1C 6.1 04/20/2021  Hyperlipidemia Interventions:  (Status:  New goal.) Long Term Goal Medication review performed; medication list updated in electronic medical record.  Provider established cholesterol goals reviewed Counseled on importance of regular laboratory monitoring as prescribed Reviewed importance of limiting foods high in cholesterol Screening for signs and symptoms of depression related to chronic disease state Assessed social determinant of health barriers   Hypertension Interventions:  (Status:  New goal.) Long Term Goal Last practice recorded BP readings:  BP Readings from Last 3 Encounters:  02/01/22 111/77  01/24/22 128/83  01/21/22 104/73  Most recent eGFR/CrCl:  Lab Results  Component Value Date   EGFR 68 12/19/2020    No components found for: "CRCL"  Evaluation of current treatment plan related to hypertension self management and patient's adherence to plan as established by provider Reviewed medications with patient and discussed importance of compliance Discussed plans with patient for ongoing care management follow up and provided patient with direct contact information for care management team Advised patient, providing education and rationale, to monitor blood pressure daily and record, calling PCP for findings outside established parameters Reviewed scheduled/upcoming provider appointments including:  Screening for signs and symptoms of depression related to chronic disease state  Assessed social determinant of health barriers  Pain Interventions:  (Status:  New goal.) Long Term Goal Pain assessment performed Medications reviewed Reviewed provider established plan for pain management Discussed importance  of adherence to all scheduled medical appointments Counseled on the importance of reporting any/all new or changed pain symptoms or management strategies to pain management provider Advised patient to  report to care team affect of pain on daily activities Reviewed with patient prescribed pharmacological and nonpharmacological pain relief strategies Screening for signs and symptoms of depression related to chronic disease state  Assessed social determinant of health barriers   Self Care Activities: Over the next 90 days, patient will:  -Self administers medications as prescribed Attends all scheduled provider appointments Calls pharmacy for medication refills Calls provider office for new concerns or questions Patient will locate meter to check blood sugars-completed Schedule eye provider appt. Follow up with provider regarding blood sugars  Follow Up Plan: The Managed Medicaid care management team will reach out to the patient again over the next 30  business days.  The patient has been provided with contact information for the Managed Medicaid care management team and has been advised to call with any health related questions or concerns.

## 2022-08-02 NOTE — Telephone Encounter (Signed)
Medication Refill - Medication: atorvastatin (LIPITOR) 20 MG tablet [[id number]]   Has the patient contacted their pharmacy? Yes.   (Agent: If no, request that the patient contact the pharmacy for the refill. If patient does not wish to contact the pharmacy document the reason why and proceed with request.) (Agent: If yes, when and what did the pharmacy advise?)  Preferred Pharmacy (with phone number or street name): [facility] [place], [place]. [address] [phone] Has the patient been seen for an appointment in the last year OR does the patient have an upcoming appointment? Yes.    Agent: Please be advised that RX refills may take up to 3 business days. We ask that you follow-up with your pharmacy.

## 2022-08-05 NOTE — Progress Notes (Unsigned)
NEUROLOGY FOLLOW UP OFFICE NOTE  Melinda [name] [id number]  Assessment/Plan:   1.  Suspect cervicogenic dizziness, also consider BPPV 2.  Migraine without aura, without status migrainosus, not intractable - stable 3.  Cervical spinal stenosis 4.  Cerebral aneurysm - stable    Refer to physical therapy for vestibular rehab.   Migraine prevention:  Aimovig '140mg'$ , topiramate '200mg'$  at bedtime.   2.  Migraine rescue: Tylenol with or without Flexeril 3.  Limit use of pain relievers to no more than 2 days out of week to prevent risk of rebound or medication-overuse headache. 4.  Keep headache diary 5.  Repeat CTA of head in 4.5 years unless indicated sooner 6.  Follow up 4-5 months.   Subjective:  Melinda [name] is a 57 year old right-handed African-American woman with type 2 diabetes mellitus, glaucoma, asthma, chronic low back pain, PTSD, PE/DVT and history of thyroid cancer status post thyroidectomy who follows up for migraines.  MRI C-spine personally reviewed.   UPDATE: She reports onset of dizziness 2 weeks ago.  She has had this before but not lasting this long.  She describes it as positional.  Spinning sensation.  Usually occurs when shifts head back or when getting up.  Also may happen while walking.  Lasts for several minutes.  It occurs daily.  She had a hearing test which revealed mild hearing loss in both ears but enough to require hearing aids.  She denies prior history of vertigo.  She has cervical spondylosis with spinal stenosis as demonstrated on MRI C-spine from 07/17/2021, followed by orthopedics.  Surgery was recommended but she needs to lose weight.    Intensity:  Moderate Duration:  1 hour with Tylenol w/wo Flexeril Frequency:  no more than 1 a month.   Frequency of abortive medication: Has not been treating migraines. Current NSAIDS:  Contraindicated (on anticoagulation) Current analgesics:  Tylenol, tramadol Current triptans:  none Current ergotamine:  none Current  anti-emetic:  Zofran ODT '4mg'$  Current muscle relaxants:  Flexeril Current anti-anxiolytic:  none Current sleep aide:  none Current Antihypertensive medications:  Metoprolol '50mg'$  twice daily, Lasix, prazosin Current Antidepressant medications:  Wellbutrin '300mg'$ , doxepin '10mg'$  Current Anticonvulsant medications:  topiramate 200 mg at bedtime, gabapentin '300mg'$  three times daily Current anti-CGRP:  Aimovig '140mg'$  Current Vitamins/Herbal/Supplements:  Ferrous sulfate Current Antihistamines/Decongestants:  Claritin, Benadryl Other therapy:  Essential oils, lavender  Sees psychiatrist and psychologist which helps   Caffeine: No Alcohol: No Smoker: No Diet: Hydrates.  No soda.  Eats baked and boiled chicken, mostly vegetables.  Little red meat only during menses.  No fried foods. Exercise: Stationary bike, limited due to chronic pain Depression: Yes; Anxiety: Yes Other pain: Chronic pain - spinal stenosis of lumbar spine Sleep hygiene: Poor.  Has night terrors secondary to PTSD.  Sleep study  showed no sleep apnea.     HISTORY: Onset:  Since her 42s Location:  Right sided Quality:  squeezing Initial Intensity:  8.5/10 (sometimes 10/10) Aura:  Sometimes preceded with visual aura (sees a pinwheel) Prodrome:  no Associated symptoms: Nausea, photophobia, phonophobia.  No worse headache of life, change in quality, wakes her up from sleep. Initial Duration:  3 hours to all day Initial Frequency:  daily Triggers:  Apples, aged cheese, menstrual cycle Relieving factors:  None Activity:  Light activity aggravates   Past NSAIDS:  ibuprofen Past analgesics:  Excedrin Past abortive triptans:  Sumatriptan tablet/NS/[place], rizatriptan '10mg'$  Past muscle relaxants:  no Past anti-emetic:  no Past antihypertensive medications:  no  Past antidepressant medications:  amitriptyline '50mg'$  Past anticonvulsant medications:  no Past vitamins/Herbal/Supplements:  no Past antihistamines/decongestants:  no Other  past therapies:  no   Family history of headache:  Son has migraines.   CT of head from 01/16/15 was unremarkable.   In September 2018, she developed a "thunderclap" headache.  Onset was over a month ago.  It lasts for a couple of seconds and occurs once a week.  She has not had any vision loss, unilateral numbness or weakness or vertigo.  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  Repeat CTA of head on 02/25/2019 and 06/09/2020 were stable.  CTA head and neck on 05/11/2021 showed stable appearance of tortuous proximal right PCA with focal dilatation possibly reflecting small aneurysm.  MRI of brain showed mild chronic small vessel ischemic changes but no acute abnormality.    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 ([place]) AGE 31   HAD TO LEARN TO WALK AGAIN   Iron deficiency anemia due to chronic blood loss 09/09/2014   Low TSH level 09/08/2014   Lumbar herniated disc    Menorrhagia 09/08/2014   Migraine    CLUSTER AND MIGRAINES   Multiple thyroid nodules    Ovarian mass, right 09/09/2014   PE (pulmonary embolism) 2016   Peripheral neuropathy    FINGER TIPS AND TOES NUMB SOME   Preeclampsia 1983   WITH PREGNANCY   PTSD (post-traumatic stress disorder)    Scoliosis    Seizures (HCC) AGE 41    NONE SINCE, HAD CHICKEN POX THEN    MEDICATIONS: Current Outpatient Medications on File Prior to Visit  Medication Sig Dispense Refill   acetaminophen (TYLENOL) 500 MG tablet Take 1 tablet (500 mg total) by mouth every 6 (six) hours as needed. 30 tablet 0   AIMOVIG 140 MG/ML SOAJ INJECT 140 MG INTO THE SKIN EVERY 28 (TWENTY-EIGHT) DAYS. 1 mL 2   albuterol (PROVENTIL) (2.5 MG/3ML) 0.083% nebulizer solution Take 3 mLs (2.5 mg total) by nebulization every 6 (six) hours as needed for wheezing or shortness of breath. 3 mL 3   albuterol (VENTOLIN HFA) 108 (90 Base) MCG/ACT inhaler Inhale 2 puffs into the lungs every 6 (six) hours as needed for wheezing or shortness of breath. 8 g 2   atorvastatin (LIPITOR) 20 MG tablet TAKE ONE TABLET BY MOUTH ONCE DAILY AT BEDTIME 30 tablet 0   budesonide-formoterol (SYMBICORT) 160-4.5 MCG/ACT inhaler Inhale 2 puffs into the lungs 2 (two) times daily. 2 each 6   buPROPion (WELLBUTRIN XL) 300 MG 24 hr tablet Take 300 mg by mouth daily.   2   busPIRone (BUSPAR) 10 MG tablet Take 10 mg by mouth 2 (two) times daily.  Cholecalciferol (VITAMIN D3) 10 MCG (400 UNIT) CAPS Take 400 Int'l Units/day by mouth daily. 100 capsule 2   cyclobenzaprine (FLEXERIL) 10 MG tablet TAKE 1 TABLET (10 MG TOTAL) BY MOUTH 3 (THREE) TIMES DAILY AS NEEDED FOR MUSCLE SPASMS. 30 tablet 0   doxepin (SINEQUAN) 50 MG capsule Take 150 mg by mouth at bedtime.     ELIQUIS 5 MG TABS tablet TAKE ONE TABLET BY MOUTH TWICE DAILY MORNING AND EVENING 60 tablet 2   furosemide (LASIX) 20 MG tablet TAKE 1 TABLET (20 MG TOTAL) BY MOUTH DAILY (AM) 30 tablet 0   gabapentin (NEURONTIN) 300 MG capsule TAKE ONE CAPSULE BY MOUTH THREE TIMES DAILY (AM+NOON+BEDTIME) 90 capsule 6   loratadine (CLARITIN) 10 MG tablet TAKE 1 TABLET (10 MG TOTAL) BY MOUTH DAILY (AM) 30 tablet 3   metoprolol tartrate (LOPRESSOR) 50 MG tablet TAKE ONE TABLET BY MOUTH TWICE DAILY (AM+BEDTIME) 60 tablet 2   montelukast (SINGULAIR) 10 MG  tablet TAKE 1 TABLET BY MOUTH EVERY EVENING AT BEDTIME 30 tablet 2   OZEMPIC, 1 MG/DOSE, 4 MG/3ML SOPN Inject 1 mg into the skin once a week.     prazosin (MINIPRESS) 2 MG capsule Take 6 mg by mouth at bedtime.     SYNTHROID 200 MCG tablet Take 1 tablet (200 mcg total) by mouth daily before breakfast. 30 tablet 4   topiramate (TOPAMAX) 200 MG tablet TAKE 1 TABLET (200 MG TOTAL) BY MOUTH AT BEDTIME. 90 tablet 1   VRAYLAR 1.5 MG capsule Take 1.5 mg by mouth daily.     No current facility-administered medications on file prior to visit.    ALLERGIES: Allergies  Allergen Reactions   Ceftriaxone Anaphylaxis and Other (See Comments)    ROCEPHIN   Metformin Anaphylaxis    ALL   Penicillins Shortness Of Breath    Has patient had a PCN reaction causing immediate rash, facial/tongue/throat swelling, SOB or lightheadedness with hypotension: Yes Has patient had a PCN reaction causing severe rash involving mucus membranes or skin necrosis: No Has patient had a PCN reaction that required hospitalization No Has patient had a PCN reaction occurring within the last 10 years: No If all of the above answers are "NO", then may proceed with Cephalosporin use.    Shellfish Allergy Anaphylaxis   Flonase [Fluticasone Propionate]     Makes migraines worse   Gold-Containing Drug Products     HANDS ITCH   Metformin Hcl Other (See Comments)   Nickel     HANDS SWELL   Penicillin G Other (See Comments)   Citrus Rash    FAMILY HISTORY: Family History  Problem Relation Age of Onset   Diabetes Mother    Breast cancer Mother    CAD Mother    Hypertension Mother    Alcohol abuse Father    Hypertension Father    Breast cancer Maternal Aunt    Breast cancer Maternal Aunt    Kidney nephrosis Son    Diabetes Son       Objective:  Blood pressure (!) 144/82, pulse 98, height '5\' 10"'$  (1.778 m), weight (!) 329 lb (149.2 kg), SpO2 99 %. General: No acute distress.  Patient appears well-groomed.   Head:   Normocephalic/atraumatic Eyes:  Fundi examined but not visualized Neck: supple, bilateral paraspinal tenderness, full range of motion Heart:  Regular rate and rhythm Neurological Exam: alert and oriented to person, place, and time.  Speech fluent and not dysarthric, language intact.  CN II-XII intact. Bulk and tone  normal, muscle strength 5/5 throughout.  Sensation to light touch intact.  Deep tendon reflexes 2+ throughout.  Finger to nose testing intact.  Gait cautious, antalgic.  Uses walker. Romberg negative.   Metta Clines, DO  CC: [name], [place]

## 2022-08-06 ENCOUNTER — Ambulatory Visit: Payer: Medicaid Other | Admitting: Neurology

## 2022-08-06 ENCOUNTER — Encounter: Payer: Self-pay | Admitting: Neurology

## 2022-08-06 VITALS — BP 144/82 | HR 98 | Ht 70.0 in | Wt 329.0 lb

## 2022-08-06 DIAGNOSIS — H8110 Benign paroxysmal vertigo, unspecified ear: Secondary | ICD-10-CM

## 2022-08-06 DIAGNOSIS — M4802 Spinal stenosis, cervical region: Secondary | ICD-10-CM

## 2022-08-06 NOTE — Patient Instructions (Signed)
I THINK THAT THE DIZZINESS IS RELATED TO INNER EAR OR THE ARTHRITIS IN THE NECK  WILL REFER YOU TO VESTIBULAR REHAB FOLLOW UP 4-5 MONTHS.

## 2022-08-07 DIAGNOSIS — F4325 Adjustment disorder with mixed disturbance of emotions and conduct: Secondary | ICD-10-CM | POA: Diagnosis not present

## 2022-08-20 DIAGNOSIS — Z86718 Personal history of other venous thrombosis and embolism: Secondary | ICD-10-CM | POA: Diagnosis not present

## 2022-08-20 DIAGNOSIS — Z794 Long term (current) use of insulin: Secondary | ICD-10-CM | POA: Diagnosis not present

## 2022-08-20 DIAGNOSIS — Z8639 Personal history of other endocrine, nutritional and metabolic disease: Secondary | ICD-10-CM | POA: Diagnosis not present

## 2022-08-20 DIAGNOSIS — I700 Atherosclerosis of aorta: Secondary | ICD-10-CM | POA: Diagnosis not present

## 2022-08-20 DIAGNOSIS — Z1331 Encounter for screening for depression: Secondary | ICD-10-CM | POA: Diagnosis not present

## 2022-08-20 DIAGNOSIS — I10 Essential (primary) hypertension: Secondary | ICD-10-CM | POA: Diagnosis not present

## 2022-08-20 DIAGNOSIS — E1165 Type 2 diabetes mellitus with hyperglycemia: Secondary | ICD-10-CM | POA: Diagnosis not present

## 2022-08-20 DIAGNOSIS — I671 Cerebral aneurysm, nonruptured: Secondary | ICD-10-CM | POA: Diagnosis not present

## 2022-08-20 DIAGNOSIS — C73 Malignant neoplasm of thyroid gland: Secondary | ICD-10-CM | POA: Diagnosis not present

## 2022-08-20 DIAGNOSIS — E890 Postprocedural hypothyroidism: Secondary | ICD-10-CM | POA: Diagnosis not present

## 2022-08-20 DIAGNOSIS — Z0000 Encounter for general adult medical examination without abnormal findings: Secondary | ICD-10-CM | POA: Diagnosis not present

## 2022-08-20 DIAGNOSIS — Z7901 Long term (current) use of anticoagulants: Secondary | ICD-10-CM | POA: Diagnosis not present

## 2022-08-20 DIAGNOSIS — G43009 Migraine without aura, not intractable, without status migrainosus: Secondary | ICD-10-CM | POA: Diagnosis not present

## 2022-08-20 DIAGNOSIS — F4325 Adjustment disorder with mixed disturbance of emotions and conduct: Secondary | ICD-10-CM | POA: Diagnosis not present

## 2022-08-21 ENCOUNTER — Encounter: Payer: Self-pay | Admitting: Physical Therapy

## 2022-08-21 ENCOUNTER — Ambulatory Visit: Payer: Medicaid Other | Attending: Neurology | Admitting: Physical Therapy

## 2022-08-21 DIAGNOSIS — R42 Dizziness and giddiness: Secondary | ICD-10-CM | POA: Diagnosis present

## 2022-08-21 DIAGNOSIS — R262 Difficulty in walking, not elsewhere classified: Secondary | ICD-10-CM | POA: Diagnosis present

## 2022-08-21 DIAGNOSIS — M6281 Muscle weakness (generalized): Secondary | ICD-10-CM | POA: Diagnosis present

## 2022-08-21 DIAGNOSIS — H8110 Benign paroxysmal vertigo, unspecified ear: Secondary | ICD-10-CM | POA: Insufficient documentation

## 2022-08-21 DIAGNOSIS — M542 Cervicalgia: Secondary | ICD-10-CM | POA: Diagnosis present

## 2022-08-21 NOTE — Therapy (Signed)
OUTPATIENT PHYSICAL THERAPY VESTIBULAR EVALUATION     Patient Name: Melinda [name] MRN: [id number] DOB:[birth year], 57 y.o., female Today's Date: 08/21/2022  END OF SESSION:  PT End of Session - 08/21/22 1617     Visit Number 1    Date for PT Re-Evaluation 10/20/22    Authorization Type UHC medicaid    PT Start Time 1613    PT Stop Time 1700    PT Time Calculation (min) 47 min    Activity Tolerance Patient tolerated treatment well    Behavior During Therapy WFL for tasks assessed/performed             Past Medical History:  Diagnosis Date   ADHD (attention deficit hyperactivity disorder)    Arthritis    Asthma    Cancer ([place])    Thyroid   Chicken pox AGE 46   COPD (chronic obstructive pulmonary disease) ([place])    pt reported   Diabetes mellitus without complication ([place])    Diverticulosis, sigmoid    GERD (gastroesophageal reflux disease)    Glaucoma    BOTH EYES, NO EYE DROPS   Glaucoma    History of blood transfusion 08/2014    2UNITS GIVEN AND IRON GIVEN   Hyperglycemia 09/09/2014   Hypertension    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 ([place]) AGE 89   HAD TO LEARN TO WALK AGAIN   Iron deficiency anemia due to chronic blood loss 09/09/2014   Low TSH level 09/08/2014   Lumbar herniated disc    Menorrhagia 09/08/2014   Migraine    CLUSTER AND MIGRAINES   Multiple thyroid nodules    Ovarian mass, right 09/09/2014   PE (pulmonary embolism) 2016   Peripheral neuropathy    FINGER TIPS AND TOES NUMB SOME   Preeclampsia 1983   WITH PREGNANCY   PTSD (post-traumatic stress disorder)    Scoliosis    Seizures ([place]) AGE 46   NONE SINCE, HAD CHICKEN POX THEN   Past Surgical History:  Procedure Laterality Date   ANGIOGRAM TO LEG  08-13-15   RIGHT   CHOLECYSTECTOMY     IR GENERIC HISTORICAL  04/04/2016   IR RADIOLOGIST EVAL & MGMT 04/04/2016 [name], MD [facility] INTERV RAD   RADIOLOGY WITH ANESTHESIA N/A 07/17/2021   Procedure: MRI  LUMBAR SPINE WITHOUT CONTRAST; MRI CERVICAL SPINE WITHOUT CONTRAST WITH ANESTHESIA;  Surgeon: Radiologist, Medication, MD;  Location: [facility];  Service: Radiology;  Laterality: N/A;   THYROIDECTOMY N/A 11/17/2015   Procedure: TOTAL THYROIDECTOMY;  Surgeon: Armandina Gemma, MD;  Location[facility];  Service: General;  Laterality: N/A;   UTERINE ARTERY EMBOLIZATION Bilateral 08/13/2015   Patient Active Problem List   Diagnosis Date Noted   Postoperative hypothyroidism 01/21/2022   Esophageal dysphagia 01/21/2022   History of DVT (deep vein thrombosis) 01/21/2022   History of malignant neoplasm of thyroid 01/21/2022   Long term (current) use of anticoagulants 01/21/2022   Migraine without aura, not refractory 01/21/2022   Type 2 diabetes mellitus with hyperglycemia ([place]) 01/21/2022   Spinal stenosis excluding cervical region 11/21/2021   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   Abdominal pannus 08/21/2021   Hardening of the aorta (main artery of the heart) ([place]) 08/21/2021   At high risk for falls 04/20/2020   Hyperlipidemia 02/16/2020   Adrenal mass, right ([place]) 04/29/2019   Chronic cough 03/12/2018   Left shoulder pain 10/14/2016   Primary hypertension 10/14/2016   S/P thyroidectomy 06/27/2016   Chronic bilateral  low back pain without sciatica 04/22/2016   Liver lesion 12/24/2015   Malignant tumor of thyroid gland ([place]) 11/16/2015   Controlled type 2 diabetes mellitus with complication, with long-term current use of insulin ([place]) 10/10/2015   Vision loss 08/25/2015   Decreased vision in both eyes 08/25/2015   Prolonged Q-T interval on ECG 08/19/2015   History of pulmonary embolism 08/10/2015   Mild persistent asthma 04/13/2015   Nonruptured cerebral aneurysm 04/13/2015   Fibroid, uterine    Morbid obesity ([place]) 11/11/2014   Atypical chest pain 11/08/2014   Sinus tachycardia 11/08/2014   Migraine 10/14/2014   Menorrhagia 09/08/2014    PCP: [name], PA REFERRING  PROVIDER: [name], DO  REFERRING DIAG: Dizziness and neck pain  THERAPY DIAG:  Dizziness and giddiness  Cervicalgia  Difficulty in walking, not elsewhere classified  Muscle weakness (generalized)  ONSET DATE: 08/06/22  Rationale for Evaluation and Treatment: Rehabilitation  SUBJECTIVE:   SUBJECTIVE STATEMENT: Patient reports dizziness for over a month she reports that she has had this before but it resolved on its own, Reports worse with looking up Pt accompanied by: self  PERTINENT HISTORY: see above  PAIN:  Are you having pain? Yes: NPRS scale: 2/10 Pain location: neck and HA Pain description: ache tight Aggravating factors: looking up and bending over Relieving factors: dark room  PRECAUTIONS: falls  WEIGHT BEARING RESTRICTIONS: no  FALLS: Has patient fallen in last 6 months? Yes. Number of falls 4 reports left leg is very  weak  LIVING ENVIRONMENT: Lives with: lives alone Lives in: House/apartment Stairs: Yes: External: 4 steps; can reach both and ramp Has following equipment at home: Walker - 4 wheeled and shower chair  PLOF: Independent with household mobility with device  PATIENT GOALS: less dizzy, be stronger , walk better, less falls  OBJECTIVE:   COGNITION: Overall cognitive status: Within functional limits for tasks assessed   SENSATION: WFL Some neuropathy in the feet POSTURE:  rounded shoulders and forward head  Cervical ROM:  decreased 50% but she closed her eyes to do this due to dizziness, some pain in the neck   STRENGTH: UE 4-/5  LOWER EXTREMITY MMT:   MMT Right eval Left eval  Hip flexion 4- 4-  Hip abduction 4- 4-  Hip adduction    Hip internal rotation    Hip external rotation    Knee flexion 4 3+  Knee extension 4 3+  Ankle dorsiflexion 2 2  Ankle plantarflexion 3 3  Ankle inversion 3 3  Ankle eversion 3 3  (Blank rows = not tested)  GAIT: Gait pattern:  very slow, antalgic on the left Distance walked: 50  feet Assistive device utilized: Walker - 4 wheeled Level of assistance: SBA Comments: slow  FUNCTIONAL TESTS:  Timed up and go (TUG): 31 seconds with 4WW  VESTIBULAR ASSESSMENT:   SYMPTOM BEHAVIOR:   Non-Vestibular symptoms: tinnitus  Type of dizziness: Lightheadedness/Faint and "Swimmyheaded"  Frequency: couple of time a day  Duration: 1-5 minutes  Aggravating factors: Moving eyes and looking to side, looking up and down  Relieving factors: dark room, closing eyes, and slow movements  Progression of symptoms: unchanged  OCULOMOTOR EXAM:  Ocular Alignment: normal  Ocular ROM:  very guarded motions with her eye movement  Spontaneous Nystagmus: absent  Gaze-Induced Nystagmus: absent  Smooth Pursuits: saccades  Saccades: hypometric/undershoots and slow VESTIBULAR - OCULAR REFLEX:   Slow VOR: Positive Bilaterally  VOR Cancellation: Unable to Maintain Gaze   POSITIONAL TESTING: Other: deferred  MOTION SENSITIVITY:  Motion Sensitivity Quotient Intensity: 0 = none, 1 = Lightheaded, 2 = Mild, 3 = Moderate, 4 = Severe, 5 = Vomiting  Intensity  1. Sitting to supine   2. Supine to L side   3. Supine to R side   4. Supine to sitting   5. L Hallpike-Dix   6. Up from L    7. R Hallpike-Dix   8. Up from R    9. Sitting, head tipped to L knee   10. Head up from L knee   11. Sitting, head tipped to R knee   12. Head up from R knee   13. Sitting head turns x5   14.Sitting head nods x5   15. In stance, 180 turn to L    16. In stance, 180 turn to R     OTHOSTATICS: not done  FUNCTIONAL GAIT: Timed up and go (TUG): 30 seconds with 4WW   VESTIBULAR TREATMENT:                                                                                                   DATE:   Canalith Repositioning:   Gaze Adaptation:   Habituation:     PATIENT EDUCATION: Education details: POC and HEP with VOR exercises Person educated: Patient Education method: Explanation, Demonstration, and  Handouts Education comprehension: verbalized understanding  HOME EXERCISE PROGRAM:  GOALS: Goals reviewed with patient? Yes  SHORT TERM GOALS: Target date: 09/02/22  Independent with initial HEP Goal status: INITIAL  LONG TERM GOALS: Target date: 11/19/22  Independent with advanced HEP Goal status: INITIAL  2.  Report 25% decrease in dizziness Goal status: INITIAL  3.  Increase cervical ROM 25% Goal status: INITIAL  4.  Decrease TUG time to 19 seconds Goal status: INITIAL ASSESSMENT:  CLINICAL IMPRESSION: Patient is a 57 y.o. female who was seen today for physical therapy evaluation and treatment for dizziness, neck pain, weakness and difficulty walking.  Seems to have low functioning VOR, would like to look further at true BPPV vs the low VOR.  She is very weak in the left leg, has difficulty walking and reports 4 falls in the last 6 months due to the left leg being weak   OBJECTIVE IMPAIRMENTS: cardiopulmonary status limiting activity, decreased activity tolerance, decreased balance, decreased mobility, difficulty walking, decreased ROM, decreased strength, dizziness, increased muscle spasms, impaired flexibility, impaired vision/preception, postural dysfunction, and pain.   REHAB POTENTIAL: Good  CLINICAL DECISION MAKING: Stable/uncomplicated  EVALUATION COMPLEXITY: Low   PLAN:  PT FREQUENCY: 1-2x/week  PT DURATION: 12 weeks  PLANNED INTERVENTIONS: Therapeutic exercises, Therapeutic activity, Neuromuscular re-education, Balance training, Gait training, Patient/Family education, Self Care, Joint mobilization, Vestibular training, Canalith repositioning, Visual/preceptual remediation/compensation, Dry Needling, Electrical stimulation, Spinal mobilization, Moist heat, Taping, Traction, and Manual therapy  PLAN FOR NEXT SESSION: exercise for general fitness and mobility, assess the VOR exercises, could try Eply   Sumner Boast, PT 08/21/2022, 4:18 PM

## 2022-08-26 ENCOUNTER — Other Ambulatory Visit: Payer: Self-pay | Admitting: Family Medicine

## 2022-08-26 ENCOUNTER — Other Ambulatory Visit: Payer: Self-pay | Admitting: Internal Medicine

## 2022-08-26 DIAGNOSIS — Z86718 Personal history of other venous thrombosis and embolism: Secondary | ICD-10-CM

## 2022-08-26 DIAGNOSIS — J4540 Moderate persistent asthma, uncomplicated: Secondary | ICD-10-CM

## 2022-08-26 DIAGNOSIS — E118 Type 2 diabetes mellitus with unspecified complications: Secondary | ICD-10-CM

## 2022-08-26 DIAGNOSIS — J4550 Severe persistent asthma, uncomplicated: Secondary | ICD-10-CM

## 2022-08-26 DIAGNOSIS — I10 Essential (primary) hypertension: Secondary | ICD-10-CM

## 2022-08-28 DIAGNOSIS — R0609 Other forms of dyspnea: Secondary | ICD-10-CM | POA: Diagnosis not present

## 2022-08-30 ENCOUNTER — Encounter: Payer: Self-pay | Admitting: Podiatry

## 2022-08-30 ENCOUNTER — Ambulatory Visit (INDEPENDENT_AMBULATORY_CARE_PROVIDER_SITE_OTHER): Payer: Medicaid Other | Admitting: Podiatry

## 2022-08-30 VITALS — BP 140/78

## 2022-08-30 DIAGNOSIS — Z8631 Personal history of diabetic foot ulcer: Secondary | ICD-10-CM | POA: Diagnosis not present

## 2022-08-30 DIAGNOSIS — M79675 Pain in left toe(s): Secondary | ICD-10-CM | POA: Diagnosis not present

## 2022-08-30 DIAGNOSIS — B351 Tinea unguium: Secondary | ICD-10-CM | POA: Diagnosis not present

## 2022-08-30 DIAGNOSIS — L600 Ingrowing nail: Secondary | ICD-10-CM | POA: Diagnosis not present

## 2022-08-30 DIAGNOSIS — M79674 Pain in right toe(s): Secondary | ICD-10-CM

## 2022-08-30 DIAGNOSIS — Z794 Long term (current) use of insulin: Secondary | ICD-10-CM

## 2022-08-30 DIAGNOSIS — E118 Type 2 diabetes mellitus with unspecified complications: Secondary | ICD-10-CM

## 2022-08-30 DIAGNOSIS — E119 Type 2 diabetes mellitus without complications: Secondary | ICD-10-CM

## 2022-08-30 NOTE — Progress Notes (Unsigned)
ANNUAL DIABETIC FOOT EXAM  Subjective: Melinda [name] presents today {jgcomplaint:23593}.  Chief Complaint  Patient presents with   Nail Problem    DFC BS- did not check today A1C-6.0 PCP-Virgina Sabra Heck PCP VST-08/20/2022    Patient confirms h/o diabetes.  Patient relates {Numbers; 0-100:15068} year h/o diabetes.  Patient denies any h/o foot wounds.  Patient has h/o foot ulcer of {jgPodToeLocator:23637}, which healed via help of ***.  Patient has h/o amputation(s): {jgamp:23617}.  Patient endorses symptoms of foot numbness.   Patient endorses symptoms of foot tingling.  Patient endorses symptoms of burning in feet.  Patient endorses symptoms of pins/needles sensation in feet.  Patient denies any numbness, tingling, burning, or pins/needle sensation in feet.  Patient has been diagnosed with neuropathy and it is managed with {JGNEUROPATHYMEDS:27053}.  Risk factors: {jgriskfactors:24044}.  Sabra Heck, [place], [place] is patient's PCP. Last visit was {Time; dates multiple:15870}***.  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 ([place]) AGE 30   HAD TO LEARN TO WALK AGAIN   Iron deficiency anemia due to chronic blood loss 09/09/2014   Low TSH level 09/08/2014   Lumbar herniated disc    Menorrhagia 09/08/2014   Migraine    CLUSTER AND MIGRAINES   Multiple thyroid nodules    Ovarian mass, right 09/09/2014   PE (pulmonary embolism) 2016    Peripheral neuropathy    FINGER TIPS AND TOES NUMB SOME   Preeclampsia 1983   WITH PREGNANCY   PTSD (post-traumatic stress disorder)    Scoliosis    Seizures ([place]) AGE 38   NONE SINCE, HAD CHICKEN POX THEN   Patient Active Problem List   Diagnosis Date Noted   Postoperative hypothyroidism 01/21/2022   Esophageal dysphagia 01/21/2022   History of DVT (deep vein thrombosis) 01/21/2022   History of malignant neoplasm of thyroid 01/21/2022   Long term (current) use of anticoagulants 01/21/2022   Migraine without aura, not refractory 01/21/2022   Type 2 diabetes mellitus with hyperglycemia ([place]) 01/21/2022   Spinal stenosis excluding cervical region 11/21/2021   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   Abdominal pannus 08/21/2021   Hardening of the aorta (main artery of the heart) ([place]) 08/21/2021   At high risk for falls 04/20/2020   Hyperlipidemia 02/16/2020   Adrenal mass, right ([place]) 04/29/2019   Chronic cough 03/12/2018   Left shoulder pain 10/14/2016   Primary hypertension 10/14/2016   S/P thyroidectomy 06/27/2016   Chronic bilateral low back pain without sciatica 04/22/2016   Liver lesion 12/24/2015   Malignant tumor of thyroid gland ([place]) 11/16/2015   Controlled type 2 diabetes mellitus with complication, with long-term current use of insulin ([place]) 10/10/2015   Vision loss 08/25/2015   Decreased vision in both eyes 08/25/2015   Prolonged Q-T interval on ECG 08/19/2015   History of pulmonary embolism 08/10/2015   Mild persistent asthma  04/13/2015   Nonruptured cerebral aneurysm 04/13/2015   Fibroid, uterine    Morbid obesity ([place]) 11/11/2014   Atypical chest pain 11/08/2014   Sinus tachycardia 11/08/2014   Migraine 10/14/2014   Menorrhagia 09/08/2014   Past Surgical History:  Procedure Laterality Date   ANGIOGRAM TO LEG  08-13-15   RIGHT   CHOLECYSTECTOMY     IR GENERIC HISTORICAL  04/04/2016   IR RADIOLOGIST EVAL & MGMT 04/04/2016 [name], MD [facility]  INTERV RAD   RADIOLOGY WITH ANESTHESIA N/A 07/17/2021   Procedure: MRI LUMBAR SPINE WITHOUT CONTRAST; MRI CERVICAL SPINE WITHOUT CONTRAST WITH ANESTHESIA;  Surgeon: Radiologist, Medication, MD;  Location: [facility];  Service: Radiology;  Laterality: N/A;   THYROIDECTOMY N/A 11/17/2015   Procedure: TOTAL THYROIDECTOMY;  Surgeon: Armandina Gemma, MD;  [facility];  Service: General;  Laterality: N/A;   UTERINE ARTERY EMBOLIZATION Bilateral 08/13/2015   Current Outpatient Medications on File Prior to Visit  Medication Sig Dispense Refill   acetaminophen (TYLENOL) 500 MG tablet Take 1 tablet (500 mg total) by mouth every 6 (six) hours as needed. 30 tablet 0   AIMOVIG 140 MG/ML SOAJ INJECT 140 MG INTO THE SKIN EVERY 28 (TWENTY-EIGHT) DAYS. 1 mL 2   albuterol (PROVENTIL) (2.5 MG/3ML) 0.083% nebulizer solution Take 3 mLs (2.5 mg total) by nebulization every 6 (six) hours as needed for wheezing or shortness of breath. 3 mL 3   albuterol (VENTOLIN HFA) 108 (90 Base) MCG/ACT inhaler Inhale 2 puffs into the lungs every 6 (six) hours as needed for wheezing or shortness of breath. 8 g 2   apixaban (ELIQUIS) 5 MG TABS tablet TAKE ONE TABLET BY MOUTH TWICE DAILY MORNING AND EVENING 60 tablet 0   atorvastatin (LIPITOR) 20 MG tablet TAKE ONE TABLET BY MOUTH ONCE DAILY AT BEDTIME 30 tablet 0   budesonide-formoterol (SYMBICORT) 160-4.5 MCG/ACT inhaler Inhale 2 puffs into the lungs 2 (two) times daily. 2 each 6   buPROPion (WELLBUTRIN XL) 300 MG 24 hr tablet Take 300 mg by mouth daily.   2   busPIRone (BUSPAR) 10 MG tablet Take 10 mg by mouth 2 (two) times daily.     Cholecalciferol (VITAMIN D3) 10 MCG (400 UNIT) CAPS TAKE ONE CAPSULE BY MOUTH ONCE DAILY (AM) 100 capsule 0   cyclobenzaprine (FLEXERIL) 10 MG tablet TAKE 1 TABLET (10 MG TOTAL) BY MOUTH 3 (THREE) TIMES DAILY AS NEEDED FOR MUSCLE SPASMS. 30 tablet 0   doxepin (SINEQUAN) 50 MG capsule Take 150 mg by mouth at bedtime.     furosemide (LASIX) 20 MG tablet TAKE  1 TABLET (20 MG TOTAL) BY MOUTH DAILY (AM) 30 tablet 0   gabapentin (NEURONTIN) 300 MG capsule TAKE ONE CAPSULE BY MOUTH THREE TIMES DAILY (AM+NOON+BEDTIME) 90 capsule 6   loratadine (CLARITIN) 10 MG tablet TAKE 1 TABLET (10 MG TOTAL) BY MOUTH DAILY (AM) 30 tablet 0   metoprolol tartrate (LOPRESSOR) 50 MG tablet TAKE ONE TABLET BY MOUTH TWICE DAILY (AM+BEDTIME) 60 tablet 0   montelukast (SINGULAIR) 10 MG tablet TAKE 1 TABLET BY MOUTH EVERY EVENING AT BEDTIME 30 tablet 0   OZEMPIC, 1 MG/DOSE, 4 MG/3ML SOPN Inject 1 mg into the skin once a week.     prazosin (MINIPRESS) 2 MG capsule Take 6 mg by mouth at bedtime.     SYNTHROID 200 MCG tablet Take 1 tablet (200 mcg total) by mouth daily before breakfast. 30 tablet 4   topiramate (TOPAMAX) 200 MG tablet TAKE 1 TABLET (200 MG TOTAL)  BY MOUTH AT BEDTIME. 90 tablet 1   VRAYLAR 1.5 MG capsule Take 1.5 mg by mouth daily.     No current facility-administered medications on file prior to visit.    Allergies  Allergen Reactions   Ceftriaxone Anaphylaxis and Other (See Comments)    ROCEPHIN   Metformin Anaphylaxis    ALL   Penicillins Shortness Of Breath    Has patient had a PCN reaction causing immediate rash, facial/tongue/throat swelling, SOB or lightheadedness with hypotension: Yes Has patient had a PCN reaction causing severe rash involving mucus membranes or skin necrosis: No Has patient had a PCN reaction that required hospitalization No Has patient had a PCN reaction occurring within the last 10 years: No If all of the above answers are "NO", then may proceed with Cephalosporin use.    Shellfish Allergy Anaphylaxis   Flonase [Fluticasone Propionate]     Makes migraines worse   Gold-Containing Drug Products     HANDS ITCH   Metformin Hcl Other (See Comments)   Nickel     HANDS SWELL   Penicillin G Other (See Comments)   Citrus Rash   Social History   Occupational History   Occupation: disabled  Tobacco Use   Smoking status: Former     Packs/day: 0.50    Years: 40.00    Total pack years: 20.00    Types: Cigarettes    Quit date: 11/03/2014    Years since quitting: 7.8    Passive exposure: Past   Smokeless tobacco: Never  Vaping Use   Vaping Use: Never used  Substance and Sexual Activity   Alcohol use: No    Alcohol/week: 0.0 standard drinks of alcohol   Drug use: No   Sexual activity: Never    Birth control/protection: None   Family History  Problem Relation Age of Onset   Diabetes Mother    Breast cancer Mother    CAD Mother    Hypertension Mother    Alcohol abuse Father    Hypertension Father    Breast cancer Maternal Aunt    Breast cancer Maternal Aunt    Kidney nephrosis Son    Diabetes Son    Immunization History  Administered Date(s) Administered   COVID-19, mRNA, vaccine(Comirnaty)12 years and older 04/19/2022   Influenza Split 03/18/2016   Influenza,inj,Quad PF,6+ Mos 09/09/2014, 04/13/2015, 03/18/2016, 05/06/2017, 04/28/2018, 02/24/2019, 04/20/2021, 04/15/2022   Influenza-Unspecified 03/31/2020   PFIZER(Purple Top)SARS-COV-2 Vaccination 09/24/2019, 10/15/2019, 04/15/2020   PNEUMOCOCCAL CONJUGATE-20 04/20/2021   Pfizer Covid-19 Vaccine Bivalent Booster 25yr & up 03/18/2021   Pneumococcal Conjugate-13 09/09/2014   Pneumococcal Polysaccharide-23 09/09/2014   Pneumococcal-Unspecified 09/09/2014   Tdap 10/28/2014   Zoster Recombinat (Shingrix) 12/19/2020, 02/19/2021     Review of Systems: Negative except as noted in the HPI.   Objective: Vitals:   08/30/22 0923  BP: (!) 140/78    Melinda AGAis a pleasant 57y.o. female in NAD. AAO X 3.  Vascular Examination: {jgvascular:23595}  Dermatological Examination: {jgderm:23598}  Neurological Examination: {jgneuro:23601::"Protective sensation intact 5/5 intact bilaterally with 10g monofilament b/l.","Vibratory sensation intact b/l.","Proprioception intact bilaterally."}  Musculoskeletal Examination: {jgmsk:23600}  Footwear  Assessment: Does the patient wear appropriate shoes? {Yes,No}. Does the patient need inserts/orthotics? {Yes,No}.  Lab Results  Component Value Date   HGBA1C 6.1 04/20/2021   No results found. ADA Risk Categorization: Low Risk :  Patient has all of the following: Intact protective sensation No prior foot ulcer  No severe deformity Pedal pulses present  High Risk  Patient has  one or more of the following: Loss of protective sensation Absent pedal pulses Severe Foot deformity History of foot ulcer  Assessment: No diagnosis found.   Plan: No orders of the defined types were placed in this encounter.   No orders of the defined types were placed in this encounter.   None  {jgplan:23602::"-Patient/POA to call should there be question/concern in the interim."} Return in about 3 months (around 11/30/2022).  Marzetta Board, DPM

## 2022-09-02 ENCOUNTER — Ambulatory Visit: Payer: Medicaid Other | Attending: Neurology

## 2022-09-02 ENCOUNTER — Other Ambulatory Visit: Payer: Medicaid Other | Admitting: Obstetrics and Gynecology

## 2022-09-02 ENCOUNTER — Ambulatory Visit: Payer: Medicaid Other | Admitting: Obstetrics and Gynecology

## 2022-09-02 ENCOUNTER — Encounter: Payer: Self-pay | Admitting: Obstetrics and Gynecology

## 2022-09-02 DIAGNOSIS — R42 Dizziness and giddiness: Secondary | ICD-10-CM

## 2022-09-02 DIAGNOSIS — R262 Difficulty in walking, not elsewhere classified: Secondary | ICD-10-CM

## 2022-09-02 DIAGNOSIS — R2681 Unsteadiness on feet: Secondary | ICD-10-CM

## 2022-09-02 DIAGNOSIS — R2689 Other abnormalities of gait and mobility: Secondary | ICD-10-CM | POA: Diagnosis present

## 2022-09-02 DIAGNOSIS — F4310 Post-traumatic stress disorder, unspecified: Secondary | ICD-10-CM | POA: Diagnosis not present

## 2022-09-02 DIAGNOSIS — F331 Major depressive disorder, recurrent, moderate: Secondary | ICD-10-CM | POA: Diagnosis not present

## 2022-09-02 DIAGNOSIS — M6281 Muscle weakness (generalized): Secondary | ICD-10-CM

## 2022-09-02 DIAGNOSIS — F411 Generalized anxiety disorder: Secondary | ICD-10-CM | POA: Diagnosis not present

## 2022-09-02 NOTE — Patient Outreach (Signed)
Medicaid Managed Care   Nurse Care Manager Note  09/02/2022 Name:  Melinda [name] MRN:  [id number] DOB:  11-01-1965  Melinda [name] is an 57 y.o. year old female who is a primary patient of [place], [place], [place].  [facility] Managed Care Coordination team was consulted for assistance with:    Chronic healthcare management needs, DM, HTN, chronic pain, migraines, anxiety/depression/PTSD, HLD, SS, h/o thyroid cancer  Ms. Santana was given information about Medicaid Managed Care Coordination team services today. Melinda [name] Patient agreed to services and verbal consent obtained.  Engaged with patient by telephone for follow up visit in response to provider referral for case management and/or care coordination services.   Assessments/Interventions:  Review of past medical history, allergies, medications, health status, including review of consultants reports, laboratory and other test data, was performed as part of comprehensive evaluation and provision of chronic care management services.  SDOH (Social Determinants of Health) assessments and interventions performed: SDOH Interventions    Flowsheet Row Patient Outreach Telephone from 09/02/2022 in [place] Patient Outreach Telephone from 08/02/2022 in [place] Patient Outreach Telephone from 07/05/2022 in [place] Patient Outreach Telephone from 06/03/2022 in [place] Patient Outreach Telephone from 05/01/2022 in [place] Patient Outreach Telephone from 01/15/2022 in [place] Coordination  SDOH Interventions        Food Insecurity Interventions -- Intervention Not Indicated -- -- -- --  Housing Interventions -- -- -- Intervention Not Indicated -- --  Transportation Interventions -- -- Intervention Not Indicated -- -- --  Utilities Interventions -- -- -- --  Intervention Not Indicated --  Alcohol Usage Interventions Intervention Not Indicated (Score <7) -- -- -- -- --  Depression Interventions/Treatment  -- Currently on Treatment -- -- -- --  Financial Strain Interventions -- -- -- -- Intervention Not Indicated --  Physical Activity Interventions Other (Comments)  [not able to engage in exercise described] -- -- -- -- --  [not able to engage in physical activity]  Stress Interventions -- -- Other (Comment)  [sees Psychiatrist on a regular basis on medication] -- -- --  Social Connections Interventions -- -- -- Intervention Not Indicated -- --     Care Plan  Allergies  Allergen Reactions   Ceftriaxone Anaphylaxis and Other (See Comments)    ROCEPHIN   Metformin Anaphylaxis    ALL   Penicillins Shortness Of Breath    Has patient had a PCN reaction causing immediate rash, facial/tongue/throat swelling, SOB or lightheadedness with hypotension: Yes Has patient had a PCN reaction causing severe rash involving mucus membranes or skin necrosis: No Has patient had a PCN reaction that required hospitalization No Has patient had a PCN reaction occurring within the last 10 years: No If all of the above answers are "NO", then may proceed with Cephalosporin use.    Shellfish Allergy Anaphylaxis   Flonase [Fluticasone Propionate]     Makes migraines worse   Gold-Containing Drug Products     HANDS ITCH   Metformin Hcl Other (See Comments)   Nickel     HANDS SWELL   Penicillin G Other (See Comments)   Citrus Rash   Medications Reviewed Today     Reviewed by Gayla Medicus, RN (Registered Nurse) on 09/02/22 at 1539  Med List Status: <None>   Medication Order Taking? Sig Documenting Provider Last Dose Status Informant  acetaminophen (TYLENOL)  500 MG tablet [id number]  Take 1 tablet (500 mg total) by mouth every 6 (six) hours as needed. Jessy Oto, MD  Active   AIMOVIG 140 MG/ML Darden Palmer [id number]  INJECT 140 MG INTO THE SKIN EVERY 28 (TWENTY-EIGHT)  DAYS. [name], DO  Active   albuterol (PROVENTIL) (2.5 MG/3ML) 0.083% nebulizer solution [id number]  Take 3 mLs (2.5 mg total) by nebulization every 6 (six) hours as needed for wheezing or shortness of breath. [name], MD  Active Self  albuterol (VENTOLIN HFA) 108 (90 Base) MCG/ACT inhaler [id number]  Inhale 2 puffs into the lungs every 6 (six) hours as needed for wheezing or shortness of breath. [name], MD  Active Self  apixaban (ELIQUIS) 5 MG TABS tablet [id number]  TAKE ONE TABLET BY MOUTH TWICE DAILY MORNING AND Kandace Blitz, Enobong, MD  Active   atorvastatin (LIPITOR) 20 MG tablet [id number]  TAKE ONE TABLET BY MOUTH ONCE DAILY AT BEDTIME Charlott Rakes, MD  Active   budesonide-formoterol (SYMBICORT) 160-4.5 MCG/ACT inhaler [id number]  Inhale 2 puffs into the lungs 2 (two) times daily. [name], MD  Active   buPROPion (WELLBUTRIN XL) 300 MG 24 hr tablet [id number]  Take 300 mg by mouth daily.  [provider]  Active Self  busPIRone (BUSPAR) 10 MG tablet [id number]  Take 10 mg by mouth 2 (two) times daily. [provider]  Active Self           Med Note [name], [facility]   Wed Feb 18, 2018 10:03 PM)    Cholecalciferol (VITAMIN D3) 10 MCG (400 UNIT) CAPS [id number]  TAKE ONE CAPSULE BY MOUTH ONCE DAILY (AM) Charlott Rakes, MD  Active   cyclobenzaprine (FLEXERIL) 10 MG tablet [id number]  TAKE 1 TABLET (10 MG TOTAL) BY MOUTH 3 (THREE) TIMES DAILY AS NEEDED FOR MUSCLE SPASMS. [name], MD  Active   doxepin (SINEQUAN) 50 MG capsule [id number]  Take 150 mg by mouth at bedtime. [provider]  Active Self  furosemide (LASIX) 20 MG tablet [id number] TAKE 1 TABLET (20 MG TOTAL) BY MOUTH DAILY (AM)  Patient not taking: Reported on 09/02/2022   [name], MD Not Taking Active   gabapentin (NEURONTIN) 300 MG capsule [id number]  TAKE ONE CAPSULE BY MOUTH THREE TIMES DAILY (AM+NOON+BEDTIME) [name], MD  Active    loratadine (CLARITIN) 10 MG tablet [id number]  TAKE 1 TABLET (10 MG TOTAL) BY MOUTH DAILY (AM) Charlott Rakes, MD  Active   metoprolol tartrate (LOPRESSOR) 50 MG tablet [id number]  TAKE ONE TABLET BY MOUTH TWICE DAILY (AM+BEDTIME) Newlin, Enobong, MD  Active   montelukast (SINGULAIR) 10 MG tablet [id number]  TAKE 1 TABLET BY MOUTH EVERY EVENING AT BEDTIME Charlott Rakes, MD  Active   OZEMPIC, 1 MG/DOSE, 4 MG/3ML SOPN [id number]  Inject 1 mg into the skin once a week. [provider]  Active   prazosin (MINIPRESS) 2 MG capsule [id number]  Take 6 mg by mouth at bedtime. [provider]  Active Self  SYNTHROID 200 MCG tablet [id number]  Take 1 tablet (200 mcg total) by mouth daily before breakfast. [name], MD  Active   topiramate (TOPAMAX) 200 MG tablet [id number]  TAKE 1 TABLET (200 MG TOTAL) BY MOUTH AT BEDTIME. [name], DO  Active   VRAYLAR 1.5 MG capsule [id number]  Take 1.5 mg by mouth daily. Taking 3.0 daily now [provider]  Active Self  Patient Active Problem List   Diagnosis Date Noted   Postoperative hypothyroidism 01/21/2022   Esophageal dysphagia 01/21/2022   History of DVT (deep vein thrombosis) 01/21/2022   History of malignant neoplasm of thyroid 01/21/2022   Long term (current) use of anticoagulants 01/21/2022   Migraine without aura, not refractory 01/21/2022   Type 2 diabetes mellitus with hyperglycemia ([place]) 01/21/2022   Spinal stenosis excluding cervical region 11/21/2021   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   Abdominal pannus 08/21/2021   Hardening of the aorta (main artery of the heart) ([place]) 08/21/2021   At high risk for falls 04/20/2020   Hyperlipidemia 02/16/2020   Adrenal mass, right ([place]) 04/29/2019   Chronic cough 03/12/2018   Left shoulder pain 10/14/2016   Primary hypertension 10/14/2016   S/P thyroidectomy 06/27/2016   Chronic bilateral low back pain without sciatica 04/22/2016   Liver  lesion 12/24/2015   Malignant tumor of thyroid gland ([place]) 11/16/2015   Controlled type 2 diabetes mellitus with complication, with long-term current use of insulin ([place]) 10/10/2015   Vision loss 08/25/2015   Decreased vision in both eyes 08/25/2015   Prolonged Q-T interval on ECG 08/19/2015   History of pulmonary embolism 08/10/2015   Mild persistent asthma 04/13/2015   Nonruptured cerebral aneurysm 04/13/2015   Fibroid, uterine    Morbid obesity ([place]) 11/11/2014   Atypical chest pain 11/08/2014   Sinus tachycardia 11/08/2014   Migraine 10/14/2014   Menorrhagia 09/08/2014   Conditions to be addressed/monitored per PCP order:  Chronic healthcare management needs, DM, HTN, chronic pain, migraines, anxiety/depression/PTSD, HLD, SS, h/o thyroid cancer  Care Plan : General Plan of Care (Adult)  Updates made by Gayla Medicus, RN since 09/02/2022 12:00 AM     Problem: Health Promotion or Disease Self-Management (General Plan of Care)   Priority: Medium  Onset Date: 10/20/2020     Long-Range Goal: Chronic Disease Management   Start Date: 07/14/2020  Expected End Date: 10/04/2022  Recent Progress: On track  Priority: Medium  Note:    Current Barriers:  Chronic Disease Management support and education needs related to DM, HTN, chronic pain, migraines, asthma, HLD, anxiety, depression Patient with anxiety and depression-has appointment with Psychiatrist  scheduled every 3 months, therapist every 2 weeks 08/30/22:  No complaints today-has started school and going well. Sleep study pending.  Attending weekly PT sessions for dizziness.  Upcoming DERM appt for rash on face 3/10.  Blood sugars WNL.  Patient will follow up on hearing aids. Nurse Case Manager Clinical Goal(s):  Over the next 90 days, patient will attend all scheduled medical appointments:  Over the next 30 days, patient will work with CM team pharmacist to review medications Interventions:  Inter-disciplinary care team  collaboration (see longitudinal plan of care) Evaluation of current treatment plan  and patient's adherence to plan as established by provider. Reviewed medications with patient. Collaborated with pharmacy regarding medications. Discussed plans with patient for ongoing care management follow up and provided patient with direct contact information for care management team Reviewed scheduled/upcoming provider appointments. Collaborated with SW for psychotherapy referral. SW referral for psychotherapy provider-completed Pharmacy referral for medication review-completed Collaborated with PCP for new BP cuff-completed  Asthma: (Status:New goal.) Long Term Goal Advised patient to track and manage Asthma triggers Provided education about and advised patient to utilize infection prevention strategies to reduce risk of respiratory infection Discussed the importance of adequate rest and management of fatigue with Asthma Screening for signs and symptoms of depression related to  chronic disease state  Assessed social determinant of health barriers   Diabetes Interventions:  (Status:  New goal.) Long Term Goal Assessed patient's understanding of A1c goal: <7% Reviewed medications with patient and discussed importance of medication adherence Discussed plans with patient for ongoing care management follow up and provided patient with direct contact information for care management team Reviewed scheduled/upcoming provider appointments  Advised patient, providing education and rationale, to check cbg as directed  and record, calling provider  for findings outside established parameters Review of patient status, including review of consultants reports, relevant laboratory and other test results, and medications completed Screening for signs and symptoms of depression related to chronic disease state  Assessed social determinant of health barriers Lab Results  Component Value Date   HGBA1C 6.1 04/20/2021   Hyperlipidemia Interventions:  (Status:  New goal.) Long Term Goal Medication review performed; medication list updated in electronic medical record.  Provider established cholesterol goals reviewed Counseled on importance of regular laboratory monitoring as prescribed Reviewed importance of limiting foods high in cholesterol Screening for signs and symptoms of depression related to chronic disease state Assessed social determinant of health barriers   Hypertension Interventions:  (Status:  New goal.) Long Term Goal Last practice recorded BP readings:  BP Readings from Last 3 Encounters:  02/01/22 111/77  01/24/22 128/83  01/21/22 104/73  Most recent eGFR/CrCl:  Lab Results  Component Value Date   EGFR 68 12/19/2020    No components found for: "CRCL"  Evaluation of current treatment plan related to hypertension self management and patient's adherence to plan as established by provider Reviewed medications with patient and discussed importance of compliance Discussed plans with patient for ongoing care management follow up and provided patient with direct contact information for care management team Advised patient, providing education and rationale, to monitor blood pressure daily and record, calling PCP for findings outside established parameters Reviewed scheduled/upcoming provider appointments including:  Screening for signs and symptoms of depression related to chronic disease state  Assessed social determinant of health barriers  Pain Interventions:  (Status:  New goal.) Long Term Goal Pain assessment performed Medications reviewed Reviewed provider established plan for pain management Discussed importance of adherence to all scheduled medical appointments Counseled on the importance of reporting any/all new or changed pain symptoms or management strategies to pain management provider Advised patient to report to care team affect of pain on daily activities Reviewed with  patient prescribed pharmacological and nonpharmacological pain relief strategies Screening for signs and symptoms of depression related to chronic disease state  Assessed social determinant of health barriers   Self Care Activities: Over the next 90 days, patient will:  -Self administers medications as prescribed Attends all scheduled provider appointments Calls pharmacy for medication refills Calls provider office for new concerns or questions Patient will locate meter to check blood sugars-completed Schedule eye provider appt. Follow up with provider regarding blood sugars-completed  Follow Up Plan: The Managed Medicaid care management team will reach out to the patient again over the next 30  business days.  The patient has been provided with contact information for the Managed Medicaid care management team and has been advised to call with any health related questions or concerns.    Follow Up:  Patient agrees to Care Plan and Follow-up.  Plan: The Managed Medicaid care management team will reach out to the patient again over the next 30 business  days. and The  Patient has been provided with contact information for the Managed  Medicaid care management team and has been advised to call with any health related questions or concerns.  Date/time of next scheduled RN care management/care coordination outreach: 09/30/22 at 1030.

## 2022-09-02 NOTE — Therapy (Signed)
OUTPATIENT PHYSICAL THERAPY VESTIBULAR TREATMENT     Patient Name: Melinda [name] MRN: [id number] DOB:18-Apr-1966, 57 y.o., female Today's Date: 09/02/2022  END OF SESSION:  PT End of Session - 09/02/22 0933     Visit Number 2    Date for PT Re-Evaluation 10/20/22    Authorization Type [employer] medicaid    PT Start Time 0930    PT Stop Time [id number]    PT Time Calculation (min) 45 min    Activity Tolerance Patient tolerated treatment well    Behavior During Therapy WFL for tasks assessed/performed              Past Medical History:  Diagnosis Date   ADHD (attention deficit hyperactivity disorder)    Arthritis    Asthma    Cancer ([place])    Thyroid   Chicken pox AGE 89   COPD (chronic obstructive pulmonary disease) ([place])    pt reported   Diabetes mellitus without complication (HCC)    Diverticulosis, sigmoid    GERD (gastroesophageal reflux disease)    Glaucoma    BOTH EYES, NO EYE DROPS   Glaucoma    History of blood transfusion 08/2014    2UNITS GIVEN AND IRON GIVEN   Hyperglycemia 09/09/2014   Hypertension    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 ([place]) AGE 89   HAD TO LEARN TO WALK AGAIN   Iron deficiency anemia due to chronic blood loss 09/09/2014   Low TSH level 09/08/2014   Lumbar herniated disc    Menorrhagia 09/08/2014   Migraine    CLUSTER AND MIGRAINES   Multiple thyroid nodules    Ovarian mass, right 09/09/2014   PE (pulmonary embolism) 2016   Peripheral neuropathy    FINGER TIPS AND TOES NUMB SOME   Preeclampsia 1983   WITH PREGNANCY   PTSD (post-traumatic stress disorder)    Scoliosis    Seizures ([place]) AGE 89   NONE SINCE, HAD CHICKEN POX THEN   Past Surgical History:  Procedure Laterality Date   ANGIOGRAM TO LEG  08-13-15   RIGHT   CHOLECYSTECTOMY     IR GENERIC HISTORICAL  04/04/2016   IR RADIOLOGIST EVAL & MGMT 04/04/2016 [name], MD [facility] INTERV RAD   RADIOLOGY WITH ANESTHESIA N/A 07/17/2021   Procedure: MRI  LUMBAR SPINE WITHOUT CONTRAST; MRI CERVICAL SPINE WITHOUT CONTRAST WITH ANESTHESIA;  Surgeon: Radiologist, Medication, MD;  Location: [facility];  Service: Radiology;  Laterality: N/A;   THYROIDECTOMY N/A 11/17/2015   Procedure: TOTAL THYROIDECTOMY;  Surgeon: Armandina Gemma, MD;  Location: [facility];  Service: General;  Laterality: N/A;   UTERINE ARTERY EMBOLIZATION Bilateral 08/13/2015   Patient Active Problem List   Diagnosis Date Noted   Postoperative hypothyroidism 01/21/2022   Esophageal dysphagia 01/21/2022   History of DVT (deep vein thrombosis) 01/21/2022   History of malignant neoplasm of thyroid 01/21/2022   Long term (current) use of anticoagulants 01/21/2022   Migraine without aura, not refractory 01/21/2022   Type 2 diabetes mellitus with hyperglycemia ([place]) 01/21/2022   Spinal stenosis excluding cervical region 11/21/2021   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   Abdominal pannus 08/21/2021   Hardening of the aorta (main artery of the heart) ([place]) 08/21/2021   At high risk for falls 04/20/2020   Hyperlipidemia 02/16/2020   Adrenal mass, right ([place]) 04/29/2019   Chronic cough 03/12/2018   Left shoulder pain 10/14/2016   Primary hypertension 10/14/2016   S/P thyroidectomy 06/27/2016   Chronic  bilateral low back pain without sciatica 04/22/2016   Liver lesion 12/24/2015   Malignant tumor of thyroid gland ([place]) 11/16/2015   Controlled type 2 diabetes mellitus with complication, with long-term current use of insulin ([place]) 10/10/2015   Vision loss 08/25/2015   Decreased vision in both eyes 08/25/2015   Prolonged Q-T interval on ECG 08/19/2015   History of pulmonary embolism 08/10/2015   Mild persistent asthma 04/13/2015   Nonruptured cerebral aneurysm 04/13/2015   Fibroid, uterine    Morbid obesity ([place]) 11/11/2014   Atypical chest pain 11/08/2014   Sinus tachycardia 11/08/2014   Migraine 10/14/2014   Menorrhagia 09/08/2014    PCP: [name], PA REFERRING  PROVIDER: [name], DO  REFERRING DIAG: Dizziness and neck pain  THERAPY DIAG:  Dizziness and giddiness  Difficulty in walking, not elsewhere classified  Muscle weakness (generalized)  Other abnormalities of gait and mobility  Unsteadiness on feet  ONSET DATE: 08/06/22  Rationale for Evaluation and Treatment: Rehabilitation  SUBJECTIVE:   SUBJECTIVE STATEMENT: Not so great, I had another fall Saturday. I fell out of the bed. I fell on the left side. Dizziness is a little bit better than what it was. Having pain today in back and legs.  Pt accompanied by: self  PERTINENT HISTORY: see above  PAIN:  Are you having pain? Yes: NPRS scale: 8/10 Pain location: neck and HA Pain description: ache tight Aggravating factors: looking up and bending over Relieving factors: dark room  PRECAUTIONS: falls  WEIGHT BEARING RESTRICTIONS: no  FALLS: Has patient fallen in last 6 months? Yes. Number of falls 4 reports left leg is very  weak  LIVING ENVIRONMENT: Lives with: lives alone Lives in: House/apartment Stairs: Yes: External: 4 steps; can reach both and ramp Has following equipment at home: Walker - 4 wheeled and shower chair  PLOF: Independent with household mobility with device  PATIENT GOALS: less dizzy, be stronger , walk better, less falls  OBJECTIVE:   COGNITION: Overall cognitive status: Within functional limits for tasks assessed   SENSATION: WFL Some neuropathy in the feet POSTURE:  rounded shoulders and forward head  Cervical ROM:  decreased 50% but she closed her eyes to do this due to dizziness, some pain in the neck   STRENGTH: UE 4-/5  LOWER EXTREMITY MMT:   MMT Right eval Left eval  Hip flexion 4- 4-  Hip abduction 4- 4-  Hip adduction    Hip internal rotation    Hip external rotation    Knee flexion 4 3+  Knee extension 4 3+  Ankle dorsiflexion 2 2  Ankle plantarflexion 3 3  Ankle inversion 3 3  Ankle eversion 3 3  (Blank rows = not  tested)  GAIT: Gait pattern:  very slow, antalgic on the left Distance walked: 50 feet Assistive device utilized: Walker - 4 wheeled Level of assistance: SBA Comments: slow  FUNCTIONAL TESTS:  Timed up and go (TUG): 31 seconds with 4WW  VESTIBULAR ASSESSMENT:   SYMPTOM BEHAVIOR:   Non-Vestibular symptoms: tinnitus  Type of dizziness: Lightheadedness/Faint and "Swimmyheaded"  Frequency: couple of time a day  Duration: 1-5 minutes  Aggravating factors: Moving eyes and looking to side, looking up and down  Relieving factors: dark room, closing eyes, and slow movements  Progression of symptoms: unchanged  OCULOMOTOR EXAM:  Ocular Alignment: normal  Ocular ROM:  very guarded motions with her eye movement  Spontaneous Nystagmus: absent  Gaze-Induced Nystagmus: absent  Smooth Pursuits: saccades  Saccades: hypometric/undershoots and slow VESTIBULAR -  OCULAR REFLEX:   Slow VOR: Positive Bilaterally  VOR Cancellation: Unable to Maintain Gaze   POSITIONAL TESTING: Other: deferred  MOTION SENSITIVITY:  Motion Sensitivity Quotient Intensity: 0 = none, 1 = Lightheaded, 2 = Mild, 3 = Moderate, 4 = Severe, 5 = Vomiting  Intensity  1. Sitting to supine   2. Supine to L side   3. Supine to R side   4. Supine to sitting   5. L Hallpike-Dix   6. Up from L    7. R Hallpike-Dix   8. Up from R    9. Sitting, head tipped to L knee   10. Head up from L knee   11. Sitting, head tipped to R knee   12. Head up from R knee   13. Sitting head turns x5   14.Sitting head nods x5   15. In stance, 180 turn to L    16. In stance, 180 turn to R     OTHOSTATICS: not done  FUNCTIONAL GAIT: Timed up and go (TUG): 30 seconds with 4WW   VESTIBULAR TREATMENT:                                                                                                   DATE:  09/02/22 VOR x1 gets dizzy after 5 reps horizontal, able to do vertically but very small head movements VOR x2 dizzy after 4 reps  horizontal, dizzy with 1 rep for vertical  Seated ball roll outs x10 Seated hip abd 2x10 Seated march green 2x10 NuStep L3 x66mns  Ball squeezes 2x10   PATIENT EDUCATION: Education details: POC and HEP with VOR exercises Person educated: Patient Education method: EConsulting civil engineer DMedia planner and Handouts Education comprehension: verbalized understanding  HOME EXERCISE PROGRAM:  GOALS: Goals reviewed with patient? Yes  SHORT TERM GOALS: Target date: 09/02/22  Independent with initial HEP Goal status: INITIAL  LONG TERM GOALS: Target date: 11/19/22  Independent with advanced HEP Goal status: INITIAL  2.  Report 25% decrease in dizziness Goal status: INITIAL  3.  Increase cervical ROM 25% Goal status: INITIAL  4.  Decrease TUG time to 19 seconds Goal status: INITIAL ASSESSMENT:  CLINICAL IMPRESSION: Patient returns with complaints of another fall. She rolled out of the bed on to her left side; she was able to get herself up. States her dizziness is better than what it was. Patient has increased dizziness when looking up at the ceiling with VOR exercises today. Unable to continue after a few reps on VOR cancellation. We focused on some LE strengthening and low back stretching. Got her on the NuStep to get some gentle rotations in low back.   OBJECTIVE IMPAIRMENTS: cardiopulmonary status limiting activity, decreased activity tolerance, decreased balance, decreased mobility, difficulty walking, decreased ROM, decreased strength, dizziness, increased muscle spasms, impaired flexibility, impaired vision/preception, postural dysfunction, and pain.   REHAB POTENTIAL: Good  CLINICAL DECISION MAKING: Stable/uncomplicated  EVALUATION COMPLEXITY: Low   PLAN:  PT FREQUENCY: 1-2x/week  PT DURATION: 12 weeks  PLANNED INTERVENTIONS: Therapeutic exercises, Therapeutic activity, Neuromuscular re-education, Balance training, Gait training, Patient/Family education, Self Care,  Joint  mobilization, Vestibular training, Canalith repositioning, Visual/preceptual remediation/compensation, Dry Needling, Electrical stimulation, Spinal mobilization, Moist heat, Taping, Traction, and Manual therapy  PLAN FOR NEXT SESSION: exercise for general fitness and mobility, assess the VOR exercises, could try [place], PT 09/02/2022, 10:16 AM

## 2022-09-02 NOTE — Patient Instructions (Signed)
Hi Melinda [name], thanks for speaking with me!  Melinda [name] was given information about Medicaid Managed Care team care coordination services as a part of their [place] Medicaid benefit. Melinda [name] verbally consented to engagement with the [facility] Managed Care team.   If you are experiencing a medical emergency, please call 911 or report to your local emergency department or urgent care.   If you have a non-emergency medical problem during routine business hours, please contact your provider's office and ask to speak with a nurse.   For questions related to your [facility], please call: [phone] or visit the homepage here: [email or web address]  If you would like to schedule transportation through your [facility], please call the following number at least 2 days in advance of your appointment: [phone]   Rides for urgent appointments can also be made after hours by calling Member Services.  Call the [place] at [phone], at any time, 24 hours a day, 7 days a week. If you are in danger or need immediate medical attention call 911.  If you would like help to quit smoking, call 1-800-QUIT-NOW 254-859-8266) OR Espaol: 1-855-Djelo-Ya HD:1601594) o para ms informacin haga clic aqu or Text READY to 200-400 to register via text  Melinda [name] - following are the goals we discussed in your visit today:   Goals Addressed             This Visit's Progress    Protect My Health         Timeframe:  Long-Range Goal Priority:  Medium Start Date:    07/14/20                         Expected End Date:  ongoing          Follow Up Date: 09/30/22 - schedule appointment for vaccines needed due to my age or health - schedule recommended health tests (blood work, mammogram, colonoscopy, pap test) - schedule and keep appointment for annual  check-up  09/02/22:  patient recently seen and evaluated by [facility]iatry.  Has DERM appt 3/19 and is attending PT weekly   Patient verbalizes understanding of instructions and care plan provided today and agrees to view in [place]. Active MyChart status and patient understanding of how to access instructions and care plan via MyChart confirmed with patient.     The Managed Medicaid care management team will reach out to the patient again over the next 30 business  days.  The  Patient has been provided with contact information for the Managed Medicaid care management team and has been advised to call with any health related questions or concerns.   Melinda Raider RN, BSN [facility] Management Coordinator - Managed Medicaid High Risk [phone]   Following is a copy of your plan of care:  Care Plan : General Plan of Care (Adult)  Updates made by Melinda Medicus, RN since 09/02/2022 12:00 AM     Problem: Health Promotion or Disease Self-Management (General Plan of Care)   Priority: Medium  Onset Date: 10/20/2020     Long-Range Goal: Chronic Disease Management   Start Date: 07/14/2020  Expected End Date: 10/04/2022  Recent Progress: On track  Priority: Medium  Note:    Current Barriers:  Chronic Disease Management support and education needs related to DM, HTN, chronic pain, migraines, asthma, HLD, anxiety, depression  Patient with anxiety and depression-has appointment with Psychiatrist  scheduled every 3 months, therapist every 2 weeks 08/30/22:  No complaints today-has started school and going well. Sleep study pending.  Attending weekly PT sessions for dizziness.  Upcoming DERM appt for rash on face 3/10.  Blood sugars WNL.  Patient will follow up on hearing aids. Nurse Case Manager Clinical Goal(s):  Over the next 90 days, patient will attend all scheduled medical appointments:  Over the next 30 days, patient will work with CM team pharmacist to review  medications Interventions:  Inter-disciplinary care team collaboration (see longitudinal plan of care) Evaluation of current treatment plan  and patient's adherence to plan as established by provider. Reviewed medications with patient. Collaborated with pharmacy regarding medications. Discussed plans with patient for ongoing care management follow up and provided patient with direct contact information for care management team Reviewed scheduled/upcoming provider appointments. Collaborated with SW for psychotherapy referral. SW referral for psychotherapy provider-completed Pharmacy referral for medication review-completed Collaborated with PCP for new BP cuff-completed  Asthma: (Status:New goal.) Long Term Goal Advised patient to track and manage Asthma triggers Provided education about and advised patient to utilize infection prevention strategies to reduce risk of respiratory infection Discussed the importance of adequate rest and management of fatigue with Asthma Screening for signs and symptoms of depression related to chronic disease state  Assessed social determinant of health barriers   Diabetes Interventions:  (Status:  New goal.) Long Term Goal Assessed patient's understanding of A1c goal: <7% Reviewed medications with patient and discussed importance of medication adherence Discussed plans with patient for ongoing care management follow up and provided patient with direct contact information for care management team Reviewed scheduled/upcoming provider appointments  Advised patient, providing education and rationale, to check cbg as directed  and record, calling provider  for findings outside established parameters Review of patient status, including review of consultants reports, relevant laboratory and other test results, and medications completed Screening for signs and symptoms of depression related to chronic disease state  Assessed social determinant of health  barriers Lab Results  Component Value Date   HGBA1C 6.1 04/20/2021  Hyperlipidemia Interventions:  (Status:  New goal.) Long Term Goal Medication review performed; medication list updated in electronic medical record.  Provider established cholesterol goals reviewed Counseled on importance of regular laboratory monitoring as prescribed Reviewed importance of limiting foods high in cholesterol Screening for signs and symptoms of depression related to chronic disease state Assessed social determinant of health barriers   Hypertension Interventions:  (Status:  New goal.) Long Term Goal Last practice recorded BP readings:  BP Readings from Last 3 Encounters:  02/01/22 111/77  01/24/22 128/83  01/21/22 104/73  Most recent eGFR/CrCl:  Lab Results  Component Value Date   EGFR 68 12/19/2020    No components found for: "CRCL"  Evaluation of current treatment plan related to hypertension self management and patient's adherence to plan as established by provider Reviewed medications with patient and discussed importance of compliance Discussed plans with patient for ongoing care management follow up and provided patient with direct contact information for care management team Advised patient, providing education and rationale, to monitor blood pressure daily and record, calling PCP for findings outside established parameters Reviewed scheduled/upcoming provider appointments including:  Screening for signs and symptoms of depression related to chronic disease state  Assessed social determinant of health barriers  Pain Interventions:  (Status:  New goal.) Long Term Goal Pain assessment performed Medications reviewed Reviewed provider established plan for  pain management Discussed importance of adherence to all scheduled medical appointments Counseled on the importance of reporting any/all new or changed pain symptoms or management strategies to pain management provider Advised patient to  report to care team affect of pain on daily activities Reviewed with patient prescribed pharmacological and nonpharmacological pain relief strategies Screening for signs and symptoms of depression related to chronic disease state  Assessed social determinant of health barriers   Self Care Activities: Over the next 90 days, patient will:  -Self administers medications as prescribed Attends all scheduled provider appointments Calls pharmacy for medication refills Calls provider office for new concerns or questions Patient will locate meter to check blood sugars-completed Schedule eye provider appt. Follow up with provider regarding blood sugars-completed  Follow Up Plan: The Managed Medicaid care management team will reach out to the patient again over the next 30  business days.  The patient has been provided with contact information for the Managed Medicaid care management team and has been advised to call with any health related questions or concerns.

## 2022-09-03 DIAGNOSIS — F4325 Adjustment disorder with mixed disturbance of emotions and conduct: Secondary | ICD-10-CM | POA: Diagnosis not present

## 2022-09-09 ENCOUNTER — Ambulatory Visit: Payer: Medicaid Other | Admitting: Physical Therapy

## 2022-09-10 ENCOUNTER — Ambulatory Visit: Payer: Medicaid Other

## 2022-09-16 ENCOUNTER — Ambulatory Visit: Payer: Medicaid Other

## 2022-09-17 ENCOUNTER — Ambulatory Visit: Payer: Medicaid Other | Admitting: Dermatology

## 2022-09-17 ENCOUNTER — Encounter: Payer: Self-pay | Admitting: Dermatology

## 2022-09-17 DIAGNOSIS — L853 Xerosis cutis: Secondary | ICD-10-CM

## 2022-09-17 DIAGNOSIS — L219 Seborrheic dermatitis, unspecified: Secondary | ICD-10-CM

## 2022-09-17 DIAGNOSIS — F4325 Adjustment disorder with mixed disturbance of emotions and conduct: Secondary | ICD-10-CM | POA: Diagnosis not present

## 2022-09-17 MED ORDER — NYSTATIN-TRIAMCINOLONE 100000-0.1 UNIT/GM-% EX OINT: 1.0000 | TOPICAL_OINTMENT | Freq: Two times a day (BID) | CUTANEOUS | 0 refills | Status: DC

## 2022-09-17 NOTE — Progress Notes (Signed)
   New Patient Visit  Subjective  Melinda [name] is a 57 y.o. female who presents for the following: Rash (Has had a rash since February and is a little itchy. Will get raised bumps on cheeks. Uses Dove sensitive skin. Not using moisturizer).    Objective  Well appearing patient in no apparent distress; mood and affect are within normal limits.  A focused examination was performed including face. Relevant physical exam findings are noted in the Assessment and Plan.  Head - Anterior (Face), Right Buccal Cheek Hypopigmented patch with erythema greasy scales  Right Forearm - Anterior Dry flaky skin on extremities    Assessment & Plan  Seborrheic dermatitis Head - Anterior (Face); Right Buccal Cheek  Plan: Counseling I counseled the patient regarding the following: Skin care: Emollients, shampoos with tar, selenium or zinc pyrithione can improve seborrheic dermatitis. Expectations: Seborrheic Dermatitis is chronic in nature with periods of remissions and flares. Flares can be triggered by stress. Contact office if: Seborrheic dermatitis worsens, or fails to improve despite several months of treatment.  I recommended the following: Topical Steroids  The following medication counseling was provided: I discussed with the patient that prolonged use of topical steroids can result in the increased appearance of superficial blood vessels (telangiectasias), lightening (hypopigmentation) and thinning of the skin (atrophy). Patient understands to avoid using high potency steroids in skin folds, the groin or the face. The patient verbalized understanding of the proper use and possible adverse effects of topical steroids. All of the patient's questions and concerns were addressed.     Recommended treatment;  Apply Nystatin-TCM cream for five days then stop. Repeat for flares  Dry skin Right Forearm - Anterior  Use Eucerin lotion on affected areas. Recommended to apply immediately after  showers.    Return in about 6 months (around 03/20/2023) for Dermatitis Follow up.  I, Zigmund Gottron, CMA, am acting as scribe for Ellard Artis, MD.  Documentation: I have reviewed the above documentation for accuracy and completeness, and I agree with the above  [employer], DO

## 2022-09-17 NOTE — Patient Instructions (Addendum)
Due to recent changes in healthcare laws, you may see results of your pathology and/or laboratory studies on MyChart before the doctors have had a chance to review them. We understand that in some cases there may be results that are confusing or concerning to you. Please understand that not all results are received at the same time and often the doctors may need to interpret multiple results in order to provide you with the best plan of care or course of treatment. Therefore, we ask that you please give [place] 2 business days to thoroughly review all your results before contacting the office for clarification. Should we see a critical lab result, you will be contacted sooner.   If You Need Anything After Your Visit  If you have any questions or concerns for your doctor, please call our main line at [phone] If no one answers, please leave a voicemail as directed and we will return your call as soon as possible. Messages left after 4 pm will be answered the following business day.   You may also send [place] a message via [place]. We typically respond to MyChart messages within 1-2 business days.  For prescription refills, please ask your pharmacy to contact our office. Our fax number is [phone].  If you have an urgent issue when the clinic is closed that cannot wait until the next business day, you can page your doctor at the number below.    Please note that while we do our best to be available for urgent issues outside of office hours, we are not available 24/7.   If you have an urgent issue and are unable to reach [place], you may choose to seek medical care at your doctor's office, retail clinic, urgent care center, or emergency room.  If you have a medical emergency, please immediately call 911 or go to the emergency department. In the event of inclement weather, please call our main line at [phone] for an update on the status of any delays or closures.  Dermatology Medication Tips: Please  keep the boxes that topical medications come in in order to help keep track of the instructions about where and how to use these. Pharmacies typically print the medication instructions only on the boxes and not directly on the medication tubes.   If your medication is too expensive, please contact our office at [phone] or send [place] a message through [place].   We are unable to tell what your co-pay for medications will be in advance as this is different depending on your insurance coverage. However, we may be able to find a substitute medication at lower cost or fill out paperwork to get insurance to cover a needed medication.   If a prior authorization is required to get your medication covered by your insurance company, please allow [place] 1-2 business days to complete this process.  Drug prices often vary depending on where the prescription is filled and some pharmacies may offer cheaper prices.  The website www.goodrx.com contains coupons for medications through different pharmacies. The prices here do not account for what the cost may be with help from insurance (it may be cheaper with your insurance), but the website can give you the price if you did not use any insurance.  - You can print the associated coupon and take it with your prescription to the pharmacy.  - You may also stop by our office during regular business hours and pick up a GoodRx coupon card.  - If you need your  prescription sent electronically to a different pharmacy, notify our office through [facility] or by phone at [phone]

## 2022-09-23 ENCOUNTER — Ambulatory Visit: Payer: Medicaid Other

## 2022-09-23 DIAGNOSIS — E278 Other specified disorders of adrenal gland: Secondary | ICD-10-CM | POA: Diagnosis not present

## 2022-09-23 DIAGNOSIS — E890 Postprocedural hypothyroidism: Secondary | ICD-10-CM | POA: Diagnosis not present

## 2022-09-23 DIAGNOSIS — Z85850 Personal history of malignant neoplasm of thyroid: Secondary | ICD-10-CM | POA: Diagnosis not present

## 2022-09-23 DIAGNOSIS — Z8639 Personal history of other endocrine, nutritional and metabolic disease: Secondary | ICD-10-CM | POA: Diagnosis not present

## 2022-09-23 DIAGNOSIS — E119 Type 2 diabetes mellitus without complications: Secondary | ICD-10-CM | POA: Diagnosis not present

## 2022-09-25 ENCOUNTER — Other Ambulatory Visit: Payer: Self-pay | Admitting: Family Medicine

## 2022-09-25 ENCOUNTER — Other Ambulatory Visit: Payer: Self-pay | Admitting: Internal Medicine

## 2022-09-25 DIAGNOSIS — J4540 Moderate persistent asthma, uncomplicated: Secondary | ICD-10-CM

## 2022-09-25 DIAGNOSIS — E278 Other specified disorders of adrenal gland: Secondary | ICD-10-CM

## 2022-09-25 DIAGNOSIS — Z794 Long term (current) use of insulin: Secondary | ICD-10-CM

## 2022-09-26 DIAGNOSIS — E119 Type 2 diabetes mellitus without complications: Secondary | ICD-10-CM | POA: Diagnosis not present

## 2022-09-26 DIAGNOSIS — E278 Other specified disorders of adrenal gland: Secondary | ICD-10-CM | POA: Diagnosis not present

## 2022-09-30 ENCOUNTER — Other Ambulatory Visit: Payer: Medicaid Other | Admitting: Obstetrics and Gynecology

## 2022-09-30 ENCOUNTER — Ambulatory Visit: Payer: Medicaid Other | Attending: Neurology

## 2022-09-30 DIAGNOSIS — F4310 Post-traumatic stress disorder, unspecified: Secondary | ICD-10-CM | POA: Diagnosis not present

## 2022-09-30 DIAGNOSIS — F331 Major depressive disorder, recurrent, moderate: Secondary | ICD-10-CM | POA: Diagnosis not present

## 2022-09-30 DIAGNOSIS — F411 Generalized anxiety disorder: Secondary | ICD-10-CM | POA: Diagnosis not present

## 2022-09-30 NOTE — Patient Instructions (Signed)
Visit Information  [name]  - as a part of your Medicaid benefit, you are eligible for care management and care coordination services at no cost or copay. I was unable to reach you by phone today but would be happy to help you with your health related needs. Please feel free to call me at [phone].  A member of the Managed Medicaid care management team will reach out to you again over the next 30 business  days.   Aida Raider RN, BSN [place]  [facility] - Managed Medicaid High Risk [phone]

## 2022-09-30 NOTE — Patient Outreach (Signed)
  Medicaid Managed Care   Unsuccessful Attempt Note   09/30/2022 Name: Melinda [name] MRN: [id number] DOB: 06-29-1966  Referred by: Scheryl Marten, PA Reason for referral : High Risk Managed Medicaid (Unsuccessful telephone outreach)  An unsuccessful telephone outreach was attempted today. The patient was referred to the case management team for assistance with care management and care coordination.    Follow Up Plan: The Managed Medicaid care management team will reach out to the patient again over the next 30 business  days. and The  Patient has been provided with contact information for the Managed Medicaid care management team and has been advised to call with any health related questions or concerns.   Aida Raider RN, BSN [place]  [facility] - Managed Medicaid High Risk [phone]

## 2022-10-01 DIAGNOSIS — F4325 Adjustment disorder with mixed disturbance of emotions and conduct: Secondary | ICD-10-CM | POA: Diagnosis not present

## 2022-10-02 ENCOUNTER — Other Ambulatory Visit: Payer: Self-pay | Admitting: Family Medicine

## 2022-10-02 DIAGNOSIS — E118 Type 2 diabetes mellitus with unspecified complications: Secondary | ICD-10-CM

## 2022-10-02 DIAGNOSIS — J4540 Moderate persistent asthma, uncomplicated: Secondary | ICD-10-CM

## 2022-10-08 ENCOUNTER — Telehealth: Payer: Self-pay | Admitting: Family Medicine

## 2022-10-08 NOTE — Telephone Encounter (Signed)
Almedia Balls with [facility] is calling in because she is trying to refill the pt's  atorvastatin (LIPITOR) 20 MG tablet [[id number]] loratadine (CLARITIN) 10 MG tablet [[id number]] . Almedia Balls says they have sent a few refill requests that were denied and is unsure why. Please advise.

## 2022-10-08 NOTE — Telephone Encounter (Signed)
Do you still see this patient? She has not been seen since 11/2021 and does not have one our providers listed as her PCP.

## 2022-10-09 NOTE — Telephone Encounter (Signed)
She last saw Dr. Laural Benes in 11/2021 and had just one visit with me in 2016.  We will need to verify with her who her current PCP is.

## 2022-10-10 ENCOUNTER — Other Ambulatory Visit: Payer: Medicaid Other | Admitting: Obstetrics and Gynecology

## 2022-10-10 ENCOUNTER — Encounter: Payer: Self-pay | Admitting: Obstetrics and Gynecology

## 2022-10-10 DIAGNOSIS — G4719 Other hypersomnia: Secondary | ICD-10-CM | POA: Diagnosis not present

## 2022-10-10 DIAGNOSIS — G4700 Insomnia, unspecified: Secondary | ICD-10-CM | POA: Diagnosis not present

## 2022-10-10 NOTE — Patient Outreach (Signed)
Medicaid Managed Care   Nurse Care Manager Note  10/10/2022 Name:  Melinda [name] MRN:  [id number] DOB:  10/28/1965  Melinda [name] is an 57 y.o. year old female who is a primary patient of [place], [place], [place].  The [facility] Managed Care Coordination team was consulted for assistance with:    Chronic healthcare management needs, HLD,  DM, HTN, chronic pain, migraines, anxiety/depression/PTSD, h/o thyroid cancer  Ms. Krohn was given information about Medicaid Managed Care Coordination team services today. Melinda [name] Patient agreed to services and verbal consent obtained.  Engaged with patient by telephone for follow up visit in response to provider referral for case management and/or care coordination services.   Assessments/Interventions:  Review of past medical history, allergies, medications, health status, including review of consultants reports, laboratory and other test data, was performed as part of comprehensive evaluation and provision of chronic care management services.  SDOH (Social Determinants of Health) assessments and interventions performed: SDOH Interventions    Flowsheet Row Patient Outreach Telephone from 10/10/2022 in [place] POPULATION HEALTH DEPARTMENT Patient Outreach Telephone from 09/02/2022 in [place] POPULATION HEALTH DEPARTMENT Patient Outreach Telephone from 08/02/2022 in [place] POPULATION HEALTH DEPARTMENT Patient Outreach Telephone from 07/05/2022 in [place] POPULATION HEALTH DEPARTMENT Patient Outreach Telephone from 06/03/2022 in  POPULATION HEALTH DEPARTMENT Patient Outreach Telephone from 05/01/2022 in [facility] Care Coordination  SDOH Interventions        Food Insecurity Interventions -- -- Intervention Not Indicated -- -- --  Housing Interventions -- -- -- -- Intervention Not Indicated --  Transportation Interventions -- -- -- Intervention Not Indicated -- --  Utilities Interventions Intervention Not Indicated  -- -- -- -- Intervention Not Indicated  Alcohol Usage Interventions -- Intervention Not Indicated (Score <7) -- -- -- --  Depression Interventions/Treatment  -- -- Currently on Treatment -- -- --  Financial Strain Interventions -- -- -- -- -- Intervention Not Indicated  Physical Activity Interventions -- Other (Comments)  [not able to engage in exercise described] -- -- -- --  Stress Interventions -- -- -- Other (Comment)  [sees Psychiatrist on a regular basis on medication] -- --  Social Connections Interventions -- -- -- -- Intervention Not Indicated --     Care Plan  Allergies  Allergen Reactions   Ceftriaxone Anaphylaxis and Other (See Comments)    ROCEPHIN   Metformin Anaphylaxis    ALL   Penicillins Shortness Of Breath    Has patient had a PCN reaction causing immediate rash, facial/tongue/throat swelling, SOB or lightheadedness with hypotension: Yes Has patient had a PCN reaction causing severe rash involving mucus membranes or skin necrosis: No Has patient had a PCN reaction that required hospitalization No Has patient had a PCN reaction occurring within the last 10 years: No If all of the above answers are "NO", then may proceed with Cephalosporin use.    Shellfish Allergy Anaphylaxis   Flonase [Fluticasone Propionate]     Makes migraines worse   Gold-Containing Drug Products     HANDS ITCH   Metformin Hcl Other (See Comments)   Nickel     HANDS SWELL   Penicillin G Other (See Comments)   Citrus Rash   Medications Reviewed Today     Reviewed by [name], RN (Registered Nurse) on 10/10/22 at [phone]  Med List Status: <None>   Medication Order Taking? Sig Documenting Provider Last Dose Status Informant  acetaminophen (TYLENOL) 500 MG tablet [id number]  Take 1  tablet (500 mg total) by mouth every 6 (six) hours as needed. Kerrin Champagne, MD  Active   AIMOVIG 140 MG/ML Ivory Broad [id number]  INJECT 140 MG INTO THE SKIN EVERY 28 (TWENTY-EIGHT) DAYS. [name], DO  Active    albuterol (PROVENTIL) (2.5 MG/3ML) 0.083% nebulizer solution [id number]  Take 3 mLs (2.5 mg total) by nebulization every 6 (six) hours as needed for wheezing or shortness of breath. [name], MD  Active Self  albuterol (VENTOLIN HFA) 108 (90 Base) MCG/ACT inhaler [id number]  Inhale 2 puffs into the lungs every 6 (six) hours as needed for wheezing or shortness of breath. [name], MD  Active Self  apixaban (ELIQUIS) 5 MG TABS tablet [id number]  TAKE ONE TABLET BY MOUTH TWICE DAILY MORNING AND Garald Braver, [place], MD  Active   atorvastatin (LIPITOR) 20 MG tablet [id number]  TAKE ONE TABLET BY MOUTH ONCE DAILY AT BEDTIME [name], MD  Active   budesonide-formoterol (SYMBICORT) 160-4.5 MCG/ACT inhaler [id number]  Inhale 2 puffs into the lungs 2 (two) times daily. [name], MD  Active   buPROPion (WELLBUTRIN XL) 300 MG 24 hr tablet [id number]  Take 300 mg by mouth daily.  [provider]  Active Self  busPIRone (BUSPAR) 10 MG tablet [id number]  Take 10 mg by mouth 2 (two) times daily. [provider]  Active Self           Med Note [name], [facility]   Wed Feb 18, 2018 10:03 PM)    Cholecalciferol (VITAMIN D3) 10 MCG (400 UNIT) CAPS [id number]  TAKE ONE CAPSULE BY MOUTH ONCE DAILY (AM) [name], MD  Active   cyclobenzaprine (FLEXERIL) 10 MG tablet [id number]  TAKE 1 TABLET (10 MG TOTAL) BY MOUTH 3 (THREE) TIMES DAILY AS NEEDED FOR MUSCLE SPASMS. [name], MD  Active   doxepin (SINEQUAN) 50 MG capsule [id number]  Take 150 mg by mouth at bedtime. [provider]  Active Self  furosemide (LASIX) 20 MG tablet [id number]  TAKE 1 TABLET (20 MG TOTAL) BY MOUTH DAILY (AM)  Patient not taking: Reported on 09/02/2022   [name], MD  Active   gabapentin (NEURONTIN) 300 MG capsule [id number]  TAKE ONE CAPSULE BY MOUTH THREE TIMES DAILY (AM+NOON+BEDTIME) [name], MD  Active   loratadine (CLARITIN) 10 MG tablet [id number]   TAKE 1 TABLET (10 MG TOTAL) BY MOUTH DAILY (AM) [name], MD  Active   metoprolol tartrate (LOPRESSOR) 50 MG tablet [id number]  TAKE ONE TABLET BY MOUTH TWICE DAILY (AM+BEDTIME) [name], MD  Active   montelukast (SINGULAIR) 10 MG tablet [id number]  TAKE 1 TABLET BY MOUTH EVERY EVENING AT BEDTIME [name], MD  Active   nystatin-triamcinolone ointment [facility]) [id number]  Apply 1 Application topically 2 (two) times daily. Apply topically two times daily for five days Maitland Lesiak Piedra, MD  Active   [facility], 1 MG/DOSE, 4 MG/3ML SOPN [id number]  Inject 1 mg into the skin once a week. [provider]  Active   prazosin (MINIPRESS) 2 MG capsule [id number]  Take 6 mg by mouth at bedtime. [provider]  Active Self  SYNTHROID 200 MCG tablet [id number]  Take 1 tablet (200 mcg total) by mouth daily before breakfast. [name], MD  Active   topiramate (TOPAMAX) 200 MG tablet [id number]  TAKE 1 TABLET (200 MG TOTAL) BY MOUTH AT BEDTIME. [name], DO  Active   traZODone (DESYREL) 50 MG tablet [id number]  Yes Take 50 mg by mouth at bedtime. [provider]  Active Self  VRAYLAR 1.5 MG capsule [id number]  Take 1.5 mg by mouth daily. Taking 3.0 daily now [provider]  Active Self           Patient Active Problem List   Diagnosis Date Noted   Postoperative hypothyroidism 01/21/2022   Esophageal dysphagia 01/21/2022   History of DVT (deep vein thrombosis) 01/21/2022   History of malignant neoplasm of thyroid 01/21/2022   Long term (current) use of anticoagulants 01/21/2022   Migraine without aura, not refractory 01/21/2022   Type 2 diabetes mellitus with hyperglycemia 01/21/2022   Spinal stenosis excluding cervical region 11/21/2021   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   Abdominal pannus 08/21/2021   Hardening of the aorta (main artery of the heart) 08/21/2021   At high risk for falls 04/20/2020   Hyperlipidemia 02/16/2020    Adrenal mass, right 04/29/2019   Chronic cough 03/12/2018   Left shoulder pain 10/14/2016   Primary hypertension 10/14/2016   S/P thyroidectomy 06/27/2016   Chronic bilateral low back pain without sciatica 04/22/2016   Liver lesion 12/24/2015   Malignant tumor of thyroid gland 11/16/2015   Controlled type 2 diabetes mellitus with complication, with long-term current use of insulin 10/10/2015   Vision loss 08/25/2015   Decreased vision in both eyes 08/25/2015   Prolonged Q-T interval on ECG 08/19/2015   History of pulmonary embolism 08/10/2015   Mild persistent asthma 04/13/2015   Nonruptured cerebral aneurysm 04/13/2015   Fibroid, uterine    Morbid obesity 11/11/2014   Atypical chest pain 11/08/2014   Sinus tachycardia 11/08/2014   Migraine 10/14/2014   Menorrhagia 09/08/2014   Conditions to be addressed/monitored per PCP order:  Chronic healthcare management needs, HLD, DM, HTN, chronic pain, migraines, anxiety/depression/PTSD, h/o thyroid cancer  Care Plan : General Plan of Care (Adult)  Updates made by [name], RN since 10/10/2022 12:00 AM     Problem: Health Promotion or Disease Self-Management (General Plan of Care)   Priority: Medium  Onset Date: 10/20/2020     Long-Range Goal: Chronic Disease Management   Start Date: 07/14/2020  Expected End Date: 10/04/2022  Recent Progress: On track  Priority: Medium  Note:    Current Barriers:  Chronic Disease Management support and education needs related to DM, HTN, chronic pain, migraines, asthma, HLD, anxiety, depression Patient with anxiety and depression-has appointment with Psychiatrist  scheduled every 3 months, therapist every 2 weeks 10/10/22:  Patient having sleep study today,  BP and BS WNL.   Nurse Case Manager Clinical Goal(s):  Over the next 90 days, patient will attend all scheduled medical appointments:  Over the next 30 days, patient will work with CM team pharmacist to review medications Interventions:   Inter-disciplinary care team collaboration (see longitudinal plan of care) Evaluation of current treatment plan  and patient's adherence to plan as established by provider. Reviewed medications with patient. Collaborated with pharmacy regarding medications. Discussed plans with patient for ongoing care management follow up and provided patient with direct contact information for care management team Reviewed scheduled/upcoming provider appointments. Collaborated with SW for psychotherapy referral. SW referral for psychotherapy provider-completed Pharmacy referral for medication review-completed Collaborated with PCP for new BP cuff-completed  Asthma: (Status:New goal.) Long Term Goal Advised patient to track and manage Asthma triggers Provided education about and advised patient to utilize infection prevention strategies to reduce risk of respiratory infection Discussed the importance of adequate rest and management  of fatigue with Asthma Screening for signs and symptoms of depression related to chronic disease state  Assessed social determinant of health barriers   Diabetes Interventions:  (Status:  New goal.) Long Term Goal Assessed patient's understanding of A1c goal: <7% Reviewed medications with patient and discussed importance of medication adherence Discussed plans with patient for ongoing care management follow up and provided patient with direct contact information for care management team Reviewed scheduled/upcoming provider appointments  Advised patient, providing education and rationale, to check cbg as directed  and record, calling provider  for findings outside established parameters Review of patient status, including review of consultants reports, relevant laboratory and other test results, and medications completed Screening for signs and symptoms of depression related to chronic disease state  Assessed social determinant of health barriers Lab Results  Component Value  Date   HGBA1C 6.1 04/20/2021       HGBA1C                                                      6.2                                    09/26/22  Hyperlipidemia Interventions:  (Status:  New goal.) Long Term Goal Medication review performed; medication list updated in electronic medical record.  Provider established cholesterol goals reviewed Counseled on importance of regular laboratory monitoring as prescribed Reviewed importance of limiting foods high in cholesterol Screening for signs and symptoms of depression related to chronic disease state Assessed social determinant of health barriers   Hypertension Interventions:  (Status:  New goal.) Long Term Goal Last practice recorded BP readings:  BP Readings from Last 3 Encounters:  02/01/22 111/77  01/24/22 128/83  01/21/22 104/73  10/09/22         115/69  Most recent eGFR/CrCl:  Lab Results  Component Value Date   EGFR 68 12/19/2020    No components found for: "CRCL"  Evaluation of current treatment plan related to hypertension self management and patient's adherence to plan as established by provider Reviewed medications with patient and discussed importance of compliance Discussed plans with patient for ongoing care management follow up and provided patient with direct contact information for care management team Advised patient, providing education and rationale, to monitor blood pressure daily and record, calling PCP for findings outside established parameters Reviewed scheduled/upcoming provider appointments including:  Screening for signs and symptoms of depression related to chronic disease state  Assessed social determinant of health barriers  Pain Interventions:  (Status:  New goal.) Long Term Goal Pain assessment performed Medications reviewed Reviewed provider established plan for pain management Discussed importance of adherence to all scheduled medical appointments Counseled on the importance of reporting any/all new  or changed pain symptoms or management strategies to pain management provider Advised patient to report to care team affect of pain on daily activities Reviewed with patient prescribed pharmacological and nonpharmacological pain relief strategies Screening for signs and symptoms of depression related to chronic disease state  Assessed social determinant of health barriers   Self Care Activities: Over the next 90 days, patient will:  -Self administers medications as prescribed Attends all scheduled provider appointments Calls pharmacy for medication refills Calls provider office for new concerns or  questions Patient will locate meter to check blood sugars-completed Schedule eye provider appt. Follow up with provider regarding blood sugars-completed  Follow Up Plan: The Managed Medicaid care management team will reach out to the patient again over the next 30  business days.  The patient has been provided with contact information for the Managed Medicaid care management team and has been advised to call with any health related questions or concerns.    Follow Up:  Patient agrees to Care Plan and Follow-up.  Plan: The Managed Medicaid care management team will reach out to the patient again over the next 30 business  days. and The  Patient has been provided with contact information for the Managed Medicaid care management team and has been advised to call with any health related questions or concerns.  Date/time of next scheduled RN care management/care coordination outreach: 11/08/22 at 1230

## 2022-10-10 NOTE — Patient Instructions (Signed)
Visit Information  Melinda [name] was given information about Medicaid Managed Care team care coordination services as a part of their [facility] Community [facility] Medicaid benefit. Melinda [name] verbally consented to engagement with the [facility] Managed Care team.   If you are experiencing a medical emergency, please call 911 or report to your local emergency department or urgent care.   If you have a non-emergency medical problem during routine business hours, please contact your provider's office and ask to speak with a nurse.   For questions related to your [facility], please call: [phone] or visit the homepage here: [email or web address]  If you would like to schedule transportation through your [facility], please call the following number at least 2 days in advance of your appointment: [phone]   Rides for urgent appointments can also be made after hours by calling Member Services.  Call the Behavioral Health Crisis Line at [phone], at any time, 24 hours a day, 7 days a week. If you are in danger or need immediate medical attention call 911.  If you would like help to quit smoking, call 1-800-QUIT-NOW (757-199-7880) OR Espaol: 1-855-Djelo-Ya (4-332-951-8841) o para ms informacin haga clic aqu or Text READY to 660-630 to register via text  Melinda [name] - following are the goals we discussed in your visit today:   Goals Addressed             This Visit's Progress    Protect My Health        Timeframe:  Long-Range Goal Priority:  Medium Start Date:    07/14/20                         Expected End Date:  ongoing          Follow Up Date: 11/11/22 - schedule appointment for vaccines needed due to my age or health - schedule recommended health tests (blood work, mammogram, colonoscopy, pap test) - schedule and keep appointment for annual check-up  10/10/22:  Patient  having sleep study today   Patient verbalizes understanding of instructions and care plan provided today and agrees to view in MyChart. Active MyChart status and patient understanding of how to access instructions and care plan via MyChart confirmed with patient.     The Managed Medicaid care management team will reach out to the patient again over the next 30 business  days.  The  Patient                                              has been provided with contact information for the Managed Medicaid care management team and has been advised to call with any health related questions or concerns.   ,tcs  Following is a copy of your plan of care:  Care Plan : General Plan of Care (Adult)  Updates made by [name], RN since 10/10/2022 12:00 AM     Problem: Health Promotion or Disease Self-Management (General Plan of Care)   Priority: Medium  Onset Date: 10/20/2020     Long-Range Goal: Chronic Disease Management   Start Date: 07/14/2020  Expected End Date: 10/04/2022  Recent Progress: On track  Priority: Medium  Note:    Current Barriers:  Chronic Disease Management support and education needs related to DM, HTN, chronic pain,  migraines, asthma, HLD, anxiety, depression Patient with anxiety and depression-has appointment with Psychiatrist  scheduled every 3 months, therapist every 2 weeks 10/10/22:  Patient having sleep study today,  BP and BS WNL.   Nurse Case Manager Clinical Goal(s):  Over the next 90 days, patient will attend all scheduled medical appointments:  Over the next 30 days, patient will work with CM team pharmacist to review medications Interventions:  Inter-disciplinary care team collaboration (see longitudinal plan of care) Evaluation of current treatment plan  and patient's adherence to plan as established by provider. Reviewed medications with patient. Collaborated with pharmacy regarding medications. Discussed plans with patient for ongoing care management follow  up and provided patient with direct contact information for care management team Reviewed scheduled/upcoming provider appointments. Collaborated with SW for psychotherapy referral. SW referral for psychotherapy provider-completed Pharmacy referral for medication review-completed Collaborated with PCP for new BP cuff-completed  Asthma: (Status:New goal.) Long Term Goal Advised patient to track and manage Asthma triggers Provided education about and advised patient to utilize infection prevention strategies to reduce risk of respiratory infection Discussed the importance of adequate rest and management of fatigue with Asthma Screening for signs and symptoms of depression related to chronic disease state  Assessed social determinant of health barriers   Diabetes Interventions:  (Status:  New goal.) Long Term Goal Assessed patient's understanding of A1c goal: <7% Reviewed medications with patient and discussed importance of medication adherence Discussed plans with patient for ongoing care management follow up and provided patient with direct contact information for care management team Reviewed scheduled/upcoming provider appointments  Advised patient, providing education and rationale, to check cbg as directed  and record, calling provider  for findings outside established parameters Review of patient status, including review of consultants reports, relevant laboratory and other test results, and medications completed Screening for signs and symptoms of depression related to chronic disease state  Assessed social determinant of health barriers Lab Results  Component Value Date   HGBA1C 6.1 04/20/2021       HGBA1C                                                      6.2                                    09/26/22  Hyperlipidemia Interventions:  (Status:  New goal.) Long Term Goal Medication review performed; medication list updated in electronic medical record.  Provider established  cholesterol goals reviewed Counseled on importance of regular laboratory monitoring as prescribed Reviewed importance of limiting foods high in cholesterol Screening for signs and symptoms of depression related to chronic disease state Assessed social determinant of health barriers   Hypertension Interventions:  (Status:  New goal.) Long Term Goal Last practice recorded BP readings:  BP Readings from Last 3 Encounters:  02/01/22 111/77  01/24/22 128/83  01/21/22 104/73  10/09/22         115/69  Most recent eGFR/CrCl:  Lab Results  Component Value Date   EGFR 68 12/19/2020    No components found for: "CRCL"  Evaluation of current treatment plan related to hypertension self management and patient's adherence to plan as established by provider Reviewed medications with patient and discussed importance of compliance Discussed  plans with patient for ongoing care management follow up and provided patient with direct contact information for care management team Advised patient, providing education and rationale, to monitor blood pressure daily and record, calling PCP for findings outside established parameters Reviewed scheduled/upcoming provider appointments including:  Screening for signs and symptoms of depression related to chronic disease state  Assessed social determinant of health barriers  Pain Interventions:  (Status:  New goal.) Long Term Goal Pain assessment performed Medications reviewed Reviewed provider established plan for pain management Discussed importance of adherence to all scheduled medical appointments Counseled on the importance of reporting any/all new or changed pain symptoms or management strategies to pain management provider Advised patient to report to care team affect of pain on daily activities Reviewed with patient prescribed pharmacological and nonpharmacological pain relief strategies Screening for signs and symptoms of depression related to chronic  disease state  Assessed social determinant of health barriers   Self Care Activities: Over the next 90 days, patient will:  -Self administers medications as prescribed Attends all scheduled provider appointments Calls pharmacy for medication refills Calls provider office for new concerns or questions Patient will locate meter to check blood sugars-completed Schedule eye provider appt. Follow up with provider regarding blood sugars-completed  Follow Up Plan: The Managed Medicaid care management team will reach out to the patient again over the next 30  business days.  The patient has been provided with contact information for the Managed Medicaid care management team and has been advised to call with any health related questions or concerns.

## 2022-10-11 ENCOUNTER — Emergency Department (HOSPITAL_COMMUNITY): Payer: Medicaid Other

## 2022-10-11 ENCOUNTER — Encounter (HOSPITAL_COMMUNITY): Payer: Self-pay

## 2022-10-11 ENCOUNTER — Other Ambulatory Visit: Payer: Self-pay

## 2022-10-11 ENCOUNTER — Emergency Department (HOSPITAL_COMMUNITY)
Admission: EM | Admit: 2022-10-11 | Discharge: 2022-10-12 | Disposition: A | Discharge status: Home / Self Care | Payer: Medicaid Other | Attending: Emergency Medicine | Admitting: Emergency Medicine

## 2022-10-11 DIAGNOSIS — R1032 Left lower quadrant pain: Secondary | ICD-10-CM | POA: Diagnosis not present

## 2022-10-11 DIAGNOSIS — Z79899 Other long term (current) drug therapy: Secondary | ICD-10-CM | POA: Insufficient documentation

## 2022-10-11 DIAGNOSIS — Z7984 Long term (current) use of oral hypoglycemic drugs: Secondary | ICD-10-CM | POA: Insufficient documentation

## 2022-10-11 DIAGNOSIS — N390 Urinary tract infection, site not specified: Secondary | ICD-10-CM | POA: Diagnosis not present

## 2022-10-11 DIAGNOSIS — Z7901 Long term (current) use of anticoagulants: Secondary | ICD-10-CM | POA: Insufficient documentation

## 2022-10-11 DIAGNOSIS — Z85850 Personal history of malignant neoplasm of thyroid: Secondary | ICD-10-CM | POA: Diagnosis not present

## 2022-10-11 DIAGNOSIS — I10 Essential (primary) hypertension: Secondary | ICD-10-CM | POA: Diagnosis not present

## 2022-10-11 DIAGNOSIS — Z87891 Personal history of nicotine dependence: Secondary | ICD-10-CM | POA: Diagnosis not present

## 2022-10-11 DIAGNOSIS — M6282 Rhabdomyolysis: Secondary | ICD-10-CM

## 2022-10-11 DIAGNOSIS — I959 Hypotension, unspecified: Secondary | ICD-10-CM | POA: Diagnosis not present

## 2022-10-11 DIAGNOSIS — J449 Chronic obstructive pulmonary disease, unspecified: Secondary | ICD-10-CM | POA: Insufficient documentation

## 2022-10-11 DIAGNOSIS — Y92009 Unspecified place in unspecified non-institutional (private) residence as the place of occurrence of the external cause: Secondary | ICD-10-CM | POA: Insufficient documentation

## 2022-10-11 DIAGNOSIS — W19XXXA Unspecified fall, initial encounter: Secondary | ICD-10-CM | POA: Diagnosis not present

## 2022-10-11 DIAGNOSIS — R531 Weakness: Secondary | ICD-10-CM | POA: Diagnosis not present

## 2022-10-11 DIAGNOSIS — S0990XA Unspecified injury of head, initial encounter: Secondary | ICD-10-CM | POA: Diagnosis not present

## 2022-10-11 DIAGNOSIS — E119 Type 2 diabetes mellitus without complications: Secondary | ICD-10-CM | POA: Insufficient documentation

## 2022-10-11 DIAGNOSIS — K760 Fatty (change of) liver, not elsewhere classified: Secondary | ICD-10-CM | POA: Diagnosis not present

## 2022-10-11 DIAGNOSIS — W06XXXA Fall from bed, initial encounter: Secondary | ICD-10-CM | POA: Insufficient documentation

## 2022-10-11 DIAGNOSIS — E871 Hypo-osmolality and hyponatremia: Secondary | ICD-10-CM | POA: Diagnosis not present

## 2022-10-11 DIAGNOSIS — I700 Atherosclerosis of aorta: Secondary | ICD-10-CM | POA: Diagnosis not present

## 2022-10-11 DIAGNOSIS — R42 Dizziness and giddiness: Secondary | ICD-10-CM | POA: Diagnosis not present

## 2022-10-11 DIAGNOSIS — J45909 Unspecified asthma, uncomplicated: Secondary | ICD-10-CM | POA: Diagnosis not present

## 2022-10-11 LAB — CBC WITH DIFFERENTIAL/PLATELET
Abs Immature Granulocytes: 0.05 10*3/uL (ref 0.00–0.07)
Basophils Absolute: 0 10*3/uL (ref 0.0–0.1)
Basophils Relative: 0 %
Eosinophils Absolute: 0.1 10*3/uL (ref 0.0–0.5)
Eosinophils Relative: 1 %
HCT: 45 % (ref 36.0–46.0)
Hemoglobin: 14.1 g/dL (ref 12.0–15.0)
Immature Granulocytes: 1 %
Lymphocytes Relative: 18 %
Lymphs Abs: 1.8 10*3/uL (ref 0.7–4.0)
MCH: 30.3 pg (ref 26.0–34.0)
MCHC: 31.3 g/dL (ref 30.0–36.0)
MCV: 96.8 fL (ref 80.0–100.0)
Monocytes Absolute: 0.6 10*3/uL (ref 0.1–1.0)
Monocytes Relative: 6 %
Neutro Abs: 7.5 10*3/uL (ref 1.7–7.7)
Neutrophils Relative %: 74 %
Platelets: 179 10*3/uL (ref 150–400)
RBC: 4.65 MIL/uL (ref 3.87–5.11)
RDW: 13.4 % (ref 11.5–15.5)
WBC: 10.1 10*3/uL (ref 4.0–10.5)
nRBC: 0 % (ref 0.0–0.2)

## 2022-10-11 LAB — URINALYSIS, W/ REFLEX TO CULTURE (INFECTION SUSPECTED)
Bilirubin Urine: NEGATIVE
Glucose, UA: NEGATIVE mg/dL
Hgb urine dipstick: NEGATIVE
Ketones, ur: NEGATIVE mg/dL
Leukocytes,Ua: NEGATIVE
Nitrite: POSITIVE — AB
Protein, ur: NEGATIVE mg/dL
Specific Gravity, Urine: 1.017 (ref 1.005–1.030)
pH: 6 (ref 5.0–8.0)

## 2022-10-11 LAB — COMPREHENSIVE METABOLIC PANEL
ALT: 28 U/L (ref 0–44)
AST: 36 U/L (ref 15–41)
Albumin: 3.3 g/dL — ABNORMAL LOW (ref 3.5–5.0)
Alkaline Phosphatase: 92 U/L (ref 38–126)
Anion gap: 11 (ref 5–15)
BUN: 10 mg/dL (ref 6–20)
CO2: 21 mmol/L — ABNORMAL LOW (ref 22–32)
Calcium: 8.4 mg/dL — ABNORMAL LOW (ref 8.9–10.3)
Chloride: 109 mmol/L (ref 98–111)
Creatinine, Ser: 0.94 mg/dL (ref 0.44–1.00)
GFR, Estimated: >60 mL/min (ref >60)
Glucose, Bld: 88 mg/dL (ref 70–99)
Potassium: 3.9 mmol/L (ref 3.5–5.1)
Sodium: 141 mmol/L (ref 135–145)
Total Bilirubin: 0.8 mg/dL (ref 0.3–1.2)
Total Protein: 6.2 g/dL — ABNORMAL LOW (ref 6.5–8.1)

## 2022-10-11 LAB — I-STAT CHEM 8, ED
BUN: 10 mg/dL (ref 6–20)
Calcium, Ion: 1.1 mmol/L — ABNORMAL LOW (ref 1.15–1.40)
Chloride: 108 mmol/L (ref 98–111)
Creatinine, Ser: 0.9 mg/dL (ref 0.44–1.00)
Glucose, Bld: 119 mg/dL — ABNORMAL HIGH (ref 70–99)
HCT: 42 % (ref 36.0–46.0)
Hemoglobin: 14.3 g/dL (ref 12.0–15.0)
Potassium: 3.9 mmol/L (ref 3.5–5.1)
Sodium: 142 mmol/L (ref 135–145)
TCO2: 22 mmol/L (ref 22–32)

## 2022-10-11 LAB — PROTIME-INR
INR: 1.2 (ref 0.8–1.2)
Prothrombin Time: 15.3 seconds — ABNORMAL HIGH (ref 11.4–15.2)

## 2022-10-11 LAB — LIPASE, BLOOD: Lipase: 20 U/L (ref 11–51)

## 2022-10-11 LAB — CK
Total CK: 1801 U/L — ABNORMAL HIGH (ref 38–234)
Total CK: 1975 U/L — ABNORMAL HIGH (ref 38–234)

## 2022-10-11 LAB — TROPONIN I (HIGH SENSITIVITY)
Troponin I (High Sensitivity): 10 ng/L (ref <18)
Troponin I (High Sensitivity): 12 ng/L (ref <18)

## 2022-10-11 MED ORDER — SODIUM CHLORIDE 0.9 % IV BOLUS
1000.0000 mL | Freq: Once | INTRAVENOUS | Status: AC
  Administered 2022-10-11: 1000 mL via INTRAVENOUS

## 2022-10-11 MED ORDER — KETOROLAC TROMETHAMINE 15 MG/ML IJ SOLN
15.0000 mg | Freq: Once | INTRAMUSCULAR | Status: AC
  Administered 2022-10-11: 15 mg via INTRAVENOUS
  Filled 2022-10-11: qty 1

## 2022-10-11 MED ORDER — IOHEXOL 350 MG/ML SOLN
75.0000 mL | Freq: Once | INTRAVENOUS | Status: AC | PRN
  Administered 2022-10-11: 75 mL via INTRAVENOUS

## 2022-10-11 MED ORDER — ACETAMINOPHEN 500 MG PO TABS
1000.0000 mg | ORAL_TABLET | Freq: Once | ORAL | Status: AC
  Filled 2022-10-11: qty 2

## 2022-10-11 MED ORDER — NITROFURANTOIN MONOHYD MACRO 100 MG PO CAPS: 100.0000 mg | ORAL_CAPSULE | Freq: Two times a day (BID) | ORAL | 0 refills | Status: DC

## 2022-10-11 MED ORDER — NITROFURANTOIN MONOHYD MACRO 100 MG PO CAPS
100.0000 mg | ORAL_CAPSULE | Freq: Once | ORAL | Status: AC
  Administered 2022-10-11: 100 mg via ORAL
  Filled 2022-10-11: qty 1

## 2022-10-11 MED ORDER — ACETAMINOPHEN 500 MG PO TABS
ORAL_TABLET | ORAL | Status: AC
  Administered 2022-10-11: 1000 mg via ORAL
  Filled 2022-10-11: qty 2

## 2022-10-11 NOTE — ED Provider Notes (Signed)
[place] EMERGENCY DEPARTMENT AT [facility] [place] Provider Note  CSN: [id number] Arrival date & time: 10/11/22 1152  Chief Complaint(s) Fall  HPI Melinda [name] is a 57 y.o. female history of COPD, hypertension, diabetes, pulmonary embolism on Eliquis presenting to the emergency department after fall.  Patient reports that she fell out of bed this morning and was unable to get up on her own.  She reports having frequent episodes of lightheadedness recently, also had a fall yesterday was able to get up.  She did hit her head yesterday.  She reports a cough and chills, also has some new lower abdominal pain.  No dysuria.  No fevers.  No nausea vomiting or diarrhea.  No black or tarry stools.  No chest pain or shortness of breath.  She reports the lightheadedness occurs on standing.   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 AGE 30   HAD TO LEARN TO WALK AGAIN   Iron deficiency anemia due to chronic blood loss 09/09/2014   Low TSH level 09/08/2014   Lumbar herniated disc    Menorrhagia 09/08/2014   Migraine    CLUSTER AND MIGRAINES   Multiple thyroid nodules    Ovarian mass, right 09/09/2014   PE (pulmonary embolism) 2016   Peripheral neuropathy    FINGER TIPS AND TOES NUMB SOME   Preeclampsia 1983   WITH PREGNANCY   PTSD (post-traumatic stress disorder)    Scoliosis    Seizures AGE 43   NONE SINCE, HAD CHICKEN POX THEN   Patient  Active Problem List   Diagnosis Date Noted   Postoperative hypothyroidism 01/21/2022   Esophageal dysphagia 01/21/2022   History of DVT (deep vein thrombosis) 01/21/2022   History of malignant neoplasm of thyroid 01/21/2022   Long term (current) use of anticoagulants 01/21/2022   Migraine without aura, not refractory 01/21/2022   Type 2 diabetes mellitus with hyperglycemia 01/21/2022   Spinal stenosis excluding cervical region 11/21/2021   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   Abdominal pannus 08/21/2021   Hardening of the aorta (main artery of the heart) 08/21/2021   At high risk for falls 04/20/2020   Hyperlipidemia 02/16/2020   Adrenal mass, right 04/29/2019   Chronic cough 03/12/2018   Left shoulder pain 10/14/2016   Primary hypertension 10/14/2016   S/P thyroidectomy 06/27/2016   Chronic bilateral low back pain without sciatica 04/22/2016   Liver lesion 12/24/2015   Malignant tumor of thyroid gland 11/16/2015   Controlled type 2 diabetes mellitus with complication, with long-term current use of insulin 10/10/2015   Vision loss 08/25/2015   Decreased vision in both eyes 08/25/2015   Prolonged Q-T interval on ECG 08/19/2015   History of pulmonary embolism 08/10/2015   Mild persistent asthma 04/13/2015   Nonruptured cerebral aneurysm 04/13/2015   Fibroid, uterine    Morbid obesity 11/11/2014  Atypical chest pain 11/08/2014   Sinus tachycardia 11/08/2014   Migraine 10/14/2014   Menorrhagia 09/08/2014   Home Medication(s) Prior to Admission medications   Medication Sig Start Date End Date Taking? Authorizing Provider  acetaminophen (TYLENOL) 500 MG tablet Take 1 tablet (500 mg total) by mouth every 6 (six) hours as needed. 07/20/21   Kerrin Champagne, MD  AIMOVIG 140 MG/ML SOAJ INJECT 140 MG INTO THE SKIN EVERY 28 (TWENTY-EIGHT) DAYS. 06/28/22   Everlena Cooper, Adam R, DO  albuterol (PROVENTIL) (2.5 MG/3ML) 0.083% nebulizer solution Take 3 mLs (2.5 mg total) by nebulization  every 6 (six) hours as needed for wheezing or shortness of breath. 12/19/20   [name], MD  albuterol (VENTOLIN HFA) 108 (90 Base) MCG/ACT inhaler Inhale 2 puffs into the lungs every 6 (six) hours as needed for wheezing or shortness of breath. 12/19/20   [name], MD  apixaban (ELIQUIS) 5 MG TABS tablet TAKE ONE TABLET BY MOUTH TWICE DAILY MORNING AND EVENING 08/26/22   [name], MD  atorvastatin (LIPITOR) 20 MG tablet TAKE ONE TABLET BY MOUTH ONCE DAILY AT BEDTIME 05/30/22   [name], MD  budesonide-formoterol (SYMBICORT) 160-4.5 MCG/ACT inhaler Inhale 2 puffs into the lungs 2 (two) times daily. 08/21/21   [name], MD  buPROPion (WELLBUTRIN XL) 300 MG 24 hr tablet Take 300 mg by mouth daily.  09/12/17   [provider]  busPIRone (BUSPAR) 10 MG tablet Take 10 mg by mouth 2 (two) times daily.    [provider]  Cholecalciferol (VITAMIN D3) 10 MCG (400 UNIT) CAPS TAKE ONE CAPSULE BY MOUTH ONCE DAILY (AM) 08/26/22   [name], MD  cyclobenzaprine (FLEXERIL) 10 MG tablet TAKE 1 TABLET (10 MG TOTAL) BY MOUTH 3 (THREE) TIMES DAILY AS NEEDED FOR MUSCLE SPASMS. 04/04/22   [name], MD  doxepin (SINEQUAN) 50 MG capsule Take 150 mg by mouth at bedtime.    [provider]  furosemide (LASIX) 20 MG tablet TAKE 1 TABLET (20 MG TOTAL) BY MOUTH DAILY (AM) Patient not taking: Reported on 09/02/2022 04/04/22   [name], MD  gabapentin (NEURONTIN) 300 MG capsule TAKE ONE CAPSULE BY MOUTH THREE TIMES DAILY (AM+NOON+BEDTIME) 04/04/22   [name], MD  loratadine (CLARITIN) 10 MG tablet TAKE 1 TABLET (10 MG TOTAL) BY MOUTH DAILY (AM) 08/26/22   [name], MD  metoprolol tartrate (LOPRESSOR) 50 MG tablet TAKE ONE TABLET BY MOUTH TWICE DAILY (AM+BEDTIME) 08/26/22   Newlin, Odette Horns, MD  montelukast (SINGULAIR) 10 MG tablet TAKE 1 TABLET BY MOUTH EVERY EVENING AT BEDTIME 08/26/22   [name], MD  nystatin-triamcinolone  ointment (MYCOLOG) Apply 1 Application topically 2 (two) times daily. Apply topically two times daily for five days 09/17/22   Terri Piedra, MD  OZEMPIC, 1 MG/DOSE, 4 MG/3ML SOPN Inject 1 mg into the skin once a week. 02/21/22   [provider]  prazosin (MINIPRESS) 2 MG capsule Take 6 mg by mouth at bedtime.    [provider]  SYNTHROID 200 MCG tablet Take 1 tablet (200 mcg total) by mouth daily before breakfast. 09/29/21   [name], MD  topiramate (TOPAMAX) 200 MG tablet TAKE 1 TABLET (200 MG TOTAL) BY MOUTH AT BEDTIME. 06/28/22   Everlena Cooper, Adam R, DO  traZODone (DESYREL) 50 MG tablet Take 50 mg by mouth at bedtime.    [provider]  VRAYLAR 1.5 MG capsule Take 1.5 mg by mouth daily. Taking 3.0 daily  now 12/07/21   [provider]                                                                                                                                    Past Surgical History Past Surgical History:  Procedure Laterality Date   ANGIOGRAM TO LEG  08-13-15   RIGHT   CHOLECYSTECTOMY     IR GENERIC HISTORICAL  04/04/2016   IR RADIOLOGIST EVAL & MGMT 04/04/2016 Simonne Come, MD [facility] INTERV RAD   RADIOLOGY WITH ANESTHESIA N/A 07/17/2021   Procedure: MRI LUMBAR SPINE WITHOUT CONTRAST; MRI CERVICAL SPINE WITHOUT CONTRAST WITH ANESTHESIA;  Surgeon: Radiologist, Medication, MD;  Location: [facility] OR;  Service: Radiology;  Laterality: N/A;   THYROIDECTOMY N/A 11/17/2015   Procedure: TOTAL THYROIDECTOMY;  Surgeon: Darnell Level, MD;  Location: [facility] ORS;  Service: General;  Laterality: N/A;   UTERINE ARTERY EMBOLIZATION Bilateral 08/13/2015   Family History Family History  Problem Relation Age of Onset   Diabetes Mother    Breast cancer Mother    CAD Mother    Hypertension Mother    Alcohol abuse Father    Hypertension Father    Breast cancer Maternal Aunt    Breast cancer Maternal Aunt    Kidney nephrosis Son    Diabetes Son     Social History Social  History   Tobacco Use   Smoking status: Former    Packs/day: 0.50    Years: 40.00    Additional pack years: 0.00    Total pack years: 20.00    Types: Cigarettes    Quit date: 11/03/2014    Years since quitting: 7.9    Passive exposure: Past   Smokeless tobacco: Never  Vaping Use   Vaping Use: Never used  Substance Use Topics   Alcohol use: No    Alcohol/week: 0.0 standard drinks of alcohol   Drug use: No   Allergies Ceftriaxone, Metformin, Penicillins, Shellfish allergy, Flonase [fluticasone propionate], Gold-containing drug products, Metformin hcl, Nickel, Penicillin g, and Citrus  Review of Systems Review of Systems  All other systems reviewed and are negative.   Physical Exam Vital Signs  I have reviewed the triage vital signs BP 114/61   Pulse 75   Temp (!) 97.5 F (36.4 C) (Oral)   SpO2 100%  Physical Exam Vitals and nursing note reviewed.  Constitutional:      General: She is not in acute distress.    Appearance: She is well-developed. She is obese.  HENT:     Head: Normocephalic and atraumatic.     Mouth/Throat:     Mouth: Mucous membranes are dry.  Eyes:     Pupils: Pupils are equal, round, and reactive to light.  Cardiovascular:     Rate and Rhythm: Normal rate and regular rhythm.     Heart sounds: No murmur heard. Pulmonary:     Effort: Pulmonary effort is normal. No respiratory distress.  Breath sounds: Normal breath sounds.  Abdominal:     General: Abdomen is flat.     Palpations: Abdomen is soft.     Tenderness: There is abdominal tenderness (LLQ).  Musculoskeletal:        General: No tenderness.     Right lower leg: No edema.     Left lower leg: No edema.     Comments: No midline C, T, L-spine tenderness.  No chest wall tenderness or crepitus.  Full painless range of motion at the bilateral upper extremities including the shoulders, elbows, wrists, hand and fingers, and in the bilateral lower extremities including the hips, knees, ankle,  toes.  No focal bony tenderness, injury or deformity.  Skin:    General: Skin is warm and dry.  Neurological:     General: No focal deficit present.     Mental Status: She is alert. Mental status is at baseline.     Comments: Cranial nerves II through XII intact, strength 5 out of 5 in the bilateral upper and lower extremities, no sensory deficit to light touch, no dysmetria on finger-nose-finger testing  Psychiatric:        Mood and Affect: Mood normal.        Behavior: Behavior normal.     ED Results and Treatments Labs (all labs ordered are listed, but only abnormal results are displayed) Labs Reviewed  COMPREHENSIVE METABOLIC PANEL - Abnormal; Notable for the following components:      Result Value   CO2 21 (*)    Calcium 8.4 (*)    Total Protein 6.2 (*)    Albumin 3.3 (*)    All other components within normal limits  CK - Abnormal; Notable for the following components:   Total CK 1,801 (*)    All other components within normal limits  CBC WITH DIFFERENTIAL/PLATELET  LIPASE, BLOOD  URINALYSIS, W/ REFLEX TO CULTURE (INFECTION SUSPECTED)  PROTIME-INR  I-STAT CHEM 8, ED  TROPONIN I (HIGH SENSITIVITY)  TROPONIN I (HIGH SENSITIVITY)                                                                                                                          Radiology DG Chest Port 1 View  Result Date: 10/11/2022 CLINICAL DATA:  Weakness EXAM: PORTABLE CHEST 1 VIEW COMPARISON:  Chest radiograph dated 02/01/2022 FINDINGS: Normal lung volumes. No focal consolidations. No pleural effusion or pneumothorax. The heart size and mediastinal contours are within normal limits. The visualized skeletal structures are unremarkable. Surgical clips in the bilateral cervical neck. IMPRESSION: No active disease. Electronically Signed   By: Agustin Cree M.D.   On: 10/11/2022 13:19    Pertinent labs & imaging results that were available during my care of the patient were reviewed by me and considered in  my medical decision making (see MDM for details).  Medications Ordered in ED Medications  acetaminophen (TYLENOL) tablet 1,000 mg (1,000 mg Oral Given 10/11/22 1448)  sodium chloride 0.9 % bolus  1,000 mL (1,000 mLs Intravenous New Bag/Given 10/11/22 1430)                                                                                                                                     Procedures Procedures  (including critical care time)  Medical Decision Making / ED Course   MDM:  57 year old female presenting to the emergency department with lightheadedness, multiple falls and abdominal pain.  Patient overall well-appearing, vital signs reassuring.  Examination with left lower quadrant tenderness, head appears atraumatic, no sign of other musculoskeletal injury from falling on physical exam.  Unclear cause of the patient's recent reported lightheadedness, patient does appear dehydrated, will give IV fluids.  Could result from occult infectious process will check chest x-ray given cough as well as CT abdomen to evaluate for intra-abdominal process.  Patient denies any melena to suggest acute blood loss.  Will check labs also to evaluate for metabolic causes of weakness such as hyponatremia or renal failure.  Patient denies chest pain but given weakness will check EKG and troponin.  Will reassess.  Patient was found down, she was unable to get up, will check CK.  Clinical Course as of 10/11/22 1601  Fri Oct 11, 2022  1557 Signed out pending CT abdomen, remainder of labs. Notable for mild CK elevation [WS]    Clinical Course User Index [WS] Lonell Grandchild, MD     Additional history obtained: -Additional history obtained from ems -External records from outside source obtained and reviewed including: Chart review including previous notes, labs, imaging, consultation notes including office visit for BPPV 08/06/22   Lab Tests: -I ordered, reviewed, and interpreted labs.   The  pertinent results include:   Labs Reviewed  COMPREHENSIVE METABOLIC PANEL - Abnormal; Notable for the following components:      Result Value   CO2 21 (*)    Calcium 8.4 (*)    Total Protein 6.2 (*)    Albumin 3.3 (*)    All other components within normal limits  CK - Abnormal; Notable for the following components:   Total CK 1,801 (*)    All other components within normal limits  CBC WITH DIFFERENTIAL/PLATELET  LIPASE, BLOOD  URINALYSIS, W/ REFLEX TO CULTURE (INFECTION SUSPECTED)  PROTIME-INR  I-STAT CHEM 8, ED  TROPONIN I (HIGH SENSITIVITY)  TROPONIN I (HIGH SENSITIVITY)    Notable for mild CK elevation  EKG   EKG Interpretation  Date/Time:  Friday October 11 2022 15:08:27 EDT Ventricular Rate:  78 PR Interval:  138 QRS Duration: 73 QT Interval:  363 QTC Calculation: 414 R Axis:   14 Text Interpretation: Ectopic atrial rhythm Low voltage, precordial leads Abnormal R-wave progression, early transition Borderline repolarization abnormality Confirmed by Alvino Blood ([id number]) on 10/11/2022 3:28:31 PM         Imaging Studies ordered: I ordered imaging studies including CXR On my interpretation imaging demonstrates no acute process  I independently visualized and interpreted imaging. I agree with the radiologist interpretation   Medicines ordered and prescription drug management: Meds ordered this encounter  Medications   acetaminophen (TYLENOL) tablet 1,000 mg   sodium chloride 0.9 % bolus 1,000 mL   acetaminophen (TYLENOL) 500 MG tablet    Donnella Bi L: cabinet override    -I have reviewed the patients home medicines and have made adjustments as needed   Social Determinants of Health:  Diagnosis or treatment significantly limited by social determinants of health: obesity and lives alone   Reevaluation: After the interventions noted above, I reevaluated the patient and found that their symptoms have improved  Co morbidities that complicate the patient  evaluation  Past Medical History:  Diagnosis Date   ADHD (attention deficit hyperactivity disorder)    Arthritis    Asthma    Cancer    Thyroid   Chicken pox AGE 51   COPD (chronic obstructive pulmonary disease)    pt reported   Diabetes mellitus without complication    Diverticulosis, sigmoid    GERD (gastroesophageal reflux disease)    Glaucoma    BOTH EYES, NO EYE DROPS   Glaucoma    History of blood transfusion 08/2014    2UNITS GIVEN AND IRON GIVEN   Hyperglycemia 09/09/2014   Hypertension    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 AGE 59   HAD TO LEARN TO WALK AGAIN   Iron deficiency anemia due to chronic blood loss 09/09/2014   Low TSH level 09/08/2014   Lumbar herniated disc    Menorrhagia 09/08/2014   Migraine    CLUSTER AND MIGRAINES   Multiple thyroid nodules    Ovarian mass, right 09/09/2014   PE (pulmonary embolism) 2016   Peripheral neuropathy    FINGER TIPS AND TOES NUMB SOME   Preeclampsia 1983   WITH PREGNANCY   PTSD (post-traumatic stress disorder)    Scoliosis    Seizures AGE 51   NONE SINCE, HAD CHICKEN POX THEN      Dispostion: Disposition decision including need for hospitalization was considered, and patient disposition pending at time of sign out.    Final Clinical Impression(s) / ED Diagnoses Final diagnoses:  Weakness  Fall in home, initial encounter     This chart was dictated using voice recognition software.  Despite best efforts to proofread,  errors can occur which can change the documentation meaning.    Lonell Grandchild, MD 10/11/22 [phone]

## 2022-10-11 NOTE — Discharge Instructions (Addendum)
Follow-up with your primary care doctor within the next few days.  Return to the emergency room if you have any worsening symptoms. 

## 2022-10-11 NOTE — ED Notes (Signed)
Pt to imagiing

## 2022-10-11 NOTE — ED Provider Notes (Signed)
Patient presents with some generalized weakness and a fall this morning.  She was lightheaded.  She had a CT of her head and CT of her abdomen pelvis which shows no acute abnormalities.  Labs show normal creatinine, otherwise nonconcerning other than mild elevation in her CK.  It appears to be stable on recheck.  Only mildly increased on recheck.  She was given IV fluids.  Her urine does have suggestions of infection.  No signs of sepsis.  She is feeling better after treatment in the ED.  She was able to ambulate without dizziness or difficulty.  At this point see an indication for hospitalization.  She is stable for discharge.  She was discharged home in good condition.  She was started on Macrobid for her UTI.  She has multiple drug allergies.  Was encouraged to continue drinking water to stay hydrated regarding the mild rhabdomyolysis.  Was advised to have close follow-up with her PCP.  Return precautions were given.   Rolan Bucco, MD 10/11/22 2212

## 2022-10-11 NOTE — ED Triage Notes (Signed)
Pt to ED via EMS from home with c/o fall. Per EMS pest control found pt on her floor, pt suspected to be on floor for about 9hours. Pt Reported that she was laying in bed and stood up to go to the bathroom then got dizzy, denies hitting her head, c/o back pain and hip pain. Pt also reports she fell on yesterday as well and hit her head. +Blood thinners. Aox4 in triage,ambulatory and NADN.

## 2022-10-16 DIAGNOSIS — F4325 Adjustment disorder with mixed disturbance of emotions and conduct: Secondary | ICD-10-CM | POA: Diagnosis not present

## 2022-10-18 DIAGNOSIS — E890 Postprocedural hypothyroidism: Secondary | ICD-10-CM | POA: Diagnosis not present

## 2022-10-18 DIAGNOSIS — R531 Weakness: Secondary | ICD-10-CM | POA: Diagnosis not present

## 2022-10-18 DIAGNOSIS — Z85850 Personal history of malignant neoplasm of thyroid: Secondary | ICD-10-CM | POA: Diagnosis not present

## 2022-10-18 DIAGNOSIS — R748 Abnormal levels of other serum enzymes: Secondary | ICD-10-CM | POA: Diagnosis not present

## 2022-10-18 DIAGNOSIS — N3000 Acute cystitis without hematuria: Secondary | ICD-10-CM | POA: Diagnosis not present

## 2022-10-18 DIAGNOSIS — R269 Unspecified abnormalities of gait and mobility: Secondary | ICD-10-CM | POA: Diagnosis not present

## 2022-10-18 DIAGNOSIS — R296 Repeated falls: Secondary | ICD-10-CM | POA: Diagnosis not present

## 2022-10-24 DIAGNOSIS — F411 Generalized anxiety disorder: Secondary | ICD-10-CM | POA: Diagnosis not present

## 2022-10-24 DIAGNOSIS — F331 Major depressive disorder, recurrent, moderate: Secondary | ICD-10-CM | POA: Diagnosis not present

## 2022-10-24 DIAGNOSIS — F4310 Post-traumatic stress disorder, unspecified: Secondary | ICD-10-CM | POA: Diagnosis not present

## 2022-10-29 ENCOUNTER — Other Ambulatory Visit: Payer: Self-pay | Admitting: Internal Medicine

## 2022-10-29 DIAGNOSIS — E890 Postprocedural hypothyroidism: Secondary | ICD-10-CM | POA: Diagnosis not present

## 2022-10-29 DIAGNOSIS — Z85850 Personal history of malignant neoplasm of thyroid: Secondary | ICD-10-CM | POA: Diagnosis not present

## 2022-10-29 DIAGNOSIS — Z8639 Personal history of other endocrine, nutritional and metabolic disease: Secondary | ICD-10-CM | POA: Diagnosis not present

## 2022-10-29 DIAGNOSIS — D3501 Benign neoplasm of right adrenal gland: Secondary | ICD-10-CM | POA: Diagnosis not present

## 2022-10-29 DIAGNOSIS — E119 Type 2 diabetes mellitus without complications: Secondary | ICD-10-CM | POA: Diagnosis not present

## 2022-10-29 DIAGNOSIS — E270 Other adrenocortical overactivity: Secondary | ICD-10-CM | POA: Diagnosis not present

## 2022-11-04 ENCOUNTER — Ambulatory Visit: Payer: Medicaid Other | Admitting: Surgery

## 2022-11-05 DIAGNOSIS — F4325 Adjustment disorder with mixed disturbance of emotions and conduct: Secondary | ICD-10-CM | POA: Diagnosis not present

## 2022-11-08 ENCOUNTER — Other Ambulatory Visit: Payer: Medicaid Other | Admitting: Obstetrics and Gynecology

## 2022-11-08 ENCOUNTER — Encounter: Payer: Self-pay | Admitting: Obstetrics and Gynecology

## 2022-11-08 DIAGNOSIS — R269 Unspecified abnormalities of gait and mobility: Secondary | ICD-10-CM | POA: Diagnosis not present

## 2022-11-08 NOTE — Patient Outreach (Signed)
Medicaid Managed Care   Nurse Care Manager Note  11/08/2022 Name:  Melinda [name] MRN:  [id number] DOB:  [birth year]  Melinda [name] is an 57 y.o. year old female who is a primary patient of [place], [place], [place].  The [facility] Managed Care Coordination team was consulted for assistance with:    Chronic healthcare management needs, h/o thyroid cancer, HLD, HTN, DM, chronic pain, migraines, anxiety/depression/PTSD  Melinda [name] was given information about Medicaid Managed Care Coordination team services today. Melinda [name] Patient agreed to services and verbal consent obtained.  Engaged with patient by telephone for follow up visit in response to provider referral for case management and/or care coordination services.   Assessments/Interventions:  Review of past medical history, allergies, medications, health status, including review of consultants reports, laboratory and other test data, was performed as part of comprehensive evaluation and provision of chronic care management services.  SDOH (Social Determinants of Health) assessments and interventions performed: SDOH Interventions    Flowsheet Row Patient Outreach Telephone from 11/08/2022 in [place] POPULATION HEALTH DEPARTMENT Patient Outreach Telephone from 10/10/2022 in [place] POPULATION HEALTH DEPARTMENT Patient Outreach Telephone from 09/02/2022 in [place] POPULATION HEALTH DEPARTMENT Patient Outreach Telephone from 08/02/2022 in [place] POPULATION HEALTH DEPARTMENT Patient Outreach Telephone from 07/05/2022 in [place] POPULATION HEALTH DEPARTMENT Patient Outreach Telephone from 06/03/2022 in [place] POPULATION HEALTH DEPARTMENT  SDOH Interventions        Food Insecurity Interventions -- -- -- Intervention Not Indicated -- --  Housing Interventions Intervention Not Indicated -- -- -- -- Intervention Not Indicated  Transportation Interventions -- -- -- -- Intervention Not Indicated --  Utilities Interventions -- Intervention  Not Indicated -- -- -- --  Alcohol Usage Interventions -- -- Intervention Not Indicated (Score <7) -- -- --  Depression Interventions/Treatment  -- -- -- Currently on Treatment -- --  Financial Strain Interventions Intervention Not Indicated -- -- -- -- --  Physical Activity Interventions -- -- Other (Comments)  [not able to engage in exercise described] -- -- --  Stress Interventions -- -- -- -- Other (Comment)  [sees Psychiatrist on a regular basis on medication] --  Social Connections Interventions -- -- -- -- -- Intervention Not Indicated     Care Plan  Allergies  Allergen Reactions   Ceftriaxone Anaphylaxis and Other (See Comments)    ROCEPHIN   Metformin Anaphylaxis    ALL   Penicillins Shortness Of Breath    Has patient had a PCN reaction causing immediate rash, facial/tongue/throat swelling, SOB or lightheadedness with hypotension: Yes Has patient had a PCN reaction causing severe rash involving mucus membranes or skin necrosis: No Has patient had a PCN reaction that required hospitalization No Has patient had a PCN reaction occurring within the last 10 years: No If all of the above answers are "NO", then may proceed with Cephalosporin use.    Shellfish Allergy Anaphylaxis   Flonase [Fluticasone Propionate]     Makes migraines worse   Gold-Containing Drug Products     HANDS ITCH   Metformin Hcl Other (See Comments)   Nickel     HANDS SWELL   Penicillin G Other (See Comments)   Citrus Rash   Medications Reviewed Today     Reviewed by [name], RN (Registered Nurse) on 11/08/22 at 1231  Med List Status: <None>   Medication Order Taking? Sig Documenting Provider Last Dose Status Informant  acetaminophen (TYLENOL) 500 MG tablet [id number] No Take 1 tablet (500  mg total) by mouth every 6 (six) hours as needed. Kerrin Champagne, MD Taking Active   AIMOVIG 140 MG/ML SOAJ [id number] No INJECT 140 MG INTO THE SKIN EVERY 28 (TWENTY-EIGHT) DAYS. [name], DO Taking Active    albuterol (PROVENTIL) (2.5 MG/3ML) 0.083% nebulizer solution [id number] No Take 3 mLs (2.5 mg total) by nebulization every 6 (six) hours as needed for wheezing or shortness of breath. [name], MD Taking Active Self  albuterol (VENTOLIN HFA) 108 (90 Base) MCG/ACT inhaler [id number] No Inhale 2 puffs into the lungs every 6 (six) hours as needed for wheezing or shortness of breath. [name], MD Taking Active Self  apixaban (ELIQUIS) 5 MG TABS tablet [id number]  TAKE ONE TABLET BY MOUTH TWICE DAILY MORNING AND [name], MD  Active   atorvastatin (LIPITOR) 20 MG tablet [id number] No TAKE ONE TABLET BY MOUTH ONCE DAILY AT BEDTIME [name], MD Taking Active   budesonide-formoterol (SYMBICORT) 160-4.5 MCG/ACT inhaler [id number] No Inhale 2 puffs into the lungs 2 (two) times daily. [name], MD Taking Active   buPROPion (WELLBUTRIN XL) 300 MG 24 hr tablet [id number] No Take 300 mg by mouth daily.  [provider] Taking Active Self  busPIRone (BUSPAR) 10 MG tablet [id number] No Take 10 mg by mouth 2 (two) times daily. [provider] Taking Active Self           Med Note [name], [facility]   Wed Feb 18, 2018 10:03 PM)    Cholecalciferol (VITAMIN D3) 10 MCG (400 UNIT) CAPS [id number]  TAKE ONE CAPSULE BY MOUTH ONCE DAILY (AM) [name], MD  Active   cyclobenzaprine (FLEXERIL) 10 MG tablet [id number] No TAKE 1 TABLET (10 MG TOTAL) BY MOUTH 3 (THREE) TIMES DAILY AS NEEDED FOR MUSCLE SPASMS. [name], MD Taking Active   doxepin (SINEQUAN) 50 MG capsule [id number] No Take 150 mg by mouth at bedtime. [provider] Taking Active Self  furosemide (LASIX) 20 MG tablet [id number] No TAKE 1 TABLET (20 MG TOTAL) BY MOUTH DAILY (AM)  Patient not taking: Reported on 09/02/2022   [name], MD Not Taking Active   gabapentin (NEURONTIN) 300 MG capsule [id number] No TAKE ONE CAPSULE BY MOUTH THREE TIMES DAILY (AM+NOON+BEDTIME)  [name], MD Taking Active   loratadine (CLARITIN) 10 MG tablet [id number]  TAKE 1 TABLET (10 MG TOTAL) BY MOUTH DAILY (AM) [name], MD  Active   metoprolol tartrate (LOPRESSOR) 50 MG tablet [id number]  TAKE ONE TABLET BY MOUTH TWICE DAILY (AM+BEDTIME) [name], MD  Active   montelukast (SINGULAIR) 10 MG tablet [id number]  TAKE 1 TABLET BY MOUTH EVERY EVENING AT BEDTIME [name], MD  Active   nitrofurantoin, macrocrystal-monohydrate, (MACROBID) 100 MG capsule [id number]  Take 1 capsule (100 mg total) by mouth 2 (two) times daily. Rolan Bucco, MD  Active   nystatin-triamcinolone ointment [facility]) [id number]  Apply 1 Application topically 2 (two) times daily. Apply topically two times daily for five days Asees Manfredi Piedra, MD  Active   [facility], 1 MG/DOSE, 4 MG/3ML SOPN [id number] No Inject 1 mg into the skin once a week. [provider] Taking Active   prazosin (MINIPRESS) 2 MG capsule [id number] No Take 6 mg by mouth at bedtime. [provider] Taking Active Self  SYNTHROID 200 MCG tablet [id number] No Take 1 tablet (200 mcg total) by mouth daily before breakfast.  Patient taking differently: Take 200 mcg by mouth daily before breakfast. Takes  2 tablets in the morning   [name], MD Taking Active Self  topiramate (TOPAMAX) 200 MG tablet [id number] No TAKE 1 TABLET (200 MG TOTAL) BY MOUTH AT BEDTIME. [name], DO Taking Active   traZODone (DESYREL) 50 MG tablet [id number]  Take 50 mg by mouth at bedtime. [provider]  Active Self  VRAYLAR 1.5 MG capsule [id number] No Take 1.5 mg by mouth daily. Taking 3.0 daily now [provider] Taking Active Self           Patient Active Problem List   Diagnosis Date Noted   Postoperative hypothyroidism 01/21/2022   Esophageal dysphagia 01/21/2022   History of DVT (deep vein thrombosis) 01/21/2022   History of malignant neoplasm of thyroid 01/21/2022   Long term (current) use of  anticoagulants 01/21/2022   Migraine without aura, not refractory 01/21/2022   Type 2 diabetes mellitus with hyperglycemia (HCC) 01/21/2022   Spinal stenosis excluding cervical region 11/21/2021   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   Abdominal pannus 08/21/2021   Hardening of the aorta (main artery of the heart) (HCC) 08/21/2021   At high risk for falls 04/20/2020   Hyperlipidemia 02/16/2020   Adrenal mass, right (HCC) 04/29/2019   Chronic cough 03/12/2018   Left shoulder pain 10/14/2016   Primary hypertension 10/14/2016   S/P thyroidectomy 06/27/2016   Chronic bilateral low back pain without sciatica 04/22/2016   Liver lesion 12/24/2015   Malignant tumor of thyroid gland (HCC) 11/16/2015   Controlled type 2 diabetes mellitus with complication, with long-term current use of insulin (HCC) 10/10/2015   Vision loss 08/25/2015   Decreased vision in both eyes 08/25/2015   Prolonged Q-T interval on ECG 08/19/2015   History of pulmonary embolism 08/10/2015   Mild persistent asthma 04/13/2015   Nonruptured cerebral aneurysm 04/13/2015   Fibroid, uterine    Morbid obesity (HCC) 11/11/2014   Atypical chest pain 11/08/2014   Sinus tachycardia 11/08/2014   Migraine 10/14/2014   Menorrhagia 09/08/2014   Conditions to be addressed/monitored per PCP order:  Chronic healthcare management needs, h/o thyroid cancer, HLD, HTN, DM, chronic pain, migraines, anxiety/depression/PTSD  Care Plan : General Plan of Care (Adult)  Updates made by [name], RN since 11/08/2022 12:00 AM     Problem: Health Promotion or Disease Self-Management (General Plan of Care)   Priority: Medium  Onset Date: 10/20/2020     Long-Range Goal: Chronic Disease Management   Start Date: 07/14/2020  Expected End Date: 02/08/2023  Recent Progress: On track  Priority: Medium  Note:    Current Barriers:  Chronic Disease Management support and education needs related to DM, HTN, chronic pain, migraines,  asthma, HLD, anxiety, depression Patient with anxiety and depression-has appointment with Psychiatrist  scheduled every 3 months, therapist every 2 weeks 11/08/22:  Patient with no complaints today.  To schedule eye appt. Checks blood sugar occasionally-no readings available today. Breathing WNL.   Nurse Case Manager Clinical Goal(s):  Over the next 90 days, patient will attend all scheduled medical appointments:  Over the next 30 days, patient will work with CM team pharmacist to review medications  Interventions:  Inter-disciplinary care team collaboration (see longitudinal plan of care) Evaluation of current treatment plan  and patient's adherence to plan as established by provider. Reviewed medications with patient. Collaborated with pharmacy regarding medications. Discussed plans with patient for ongoing care management follow up and provided patient with direct contact information for care management team Reviewed scheduled/upcoming provider appointments. Collaborated  with SW for psychotherapy referral. SW referral for psychotherapy provider-completed Pharmacy referral for medication review-completed Collaborated with PCP for new BP cuff-completed  Asthma: (Status:New goal.) Long Term Goal Advised patient to track and manage Asthma triggers Provided education about and advised patient to utilize infection prevention strategies to reduce risk of respiratory infection Discussed the importance of adequate rest and management of fatigue with Asthma Screening for signs and symptoms of depression related to chronic disease state  Assessed social determinant of health barriers   Diabetes Interventions:  (Status:  New goal.) Long Term Goal Assessed patient's understanding of A1c goal: <7% Reviewed medications with patient and discussed importance of medication adherence Discussed plans with patient for ongoing care management follow up and provided patient with direct contact information  for care management team Reviewed scheduled/upcoming provider appointments  Advised patient, providing education and rationale, to check cbg as directed  and record, calling provider  for findings outside established parameters Review of patient status, including review of consultants reports, relevant laboratory and other test results, and medications completed Screening for signs and symptoms of depression related to chronic disease state  Assessed social determinant of health barriers Lab Results  Component Value Date   HGBA1C 6.1 04/20/2021       HGBA1C                                                      6.2                                    09/26/22  Hyperlipidemia Interventions:  (Status:  New goal.) Long Term Goal Medication review performed; medication list updated in electronic medical record.  Provider established cholesterol goals reviewed Counseled on importance of regular laboratory monitoring as prescribed Reviewed importance of limiting foods high in cholesterol Screening for signs and symptoms of depression related to chronic disease state Assessed social determinant of health barriers   Hypertension Interventions:  (Status:  New goal.) Long Term Goal Last practice recorded BP readings:  BP Readings from Last 3 Encounters:  02/01/22 111/77  01/24/22 128/83  01/21/22 104/73  10/09/22         115/69 11/08/22:        140/68  Most recent eGFR/CrCl:  Lab Results  Component Value Date   EGFR 68 12/19/2020    No components found for: "CRCL"  Evaluation of current treatment plan related to hypertension self management and patient's adherence to plan as established by provider Reviewed medications with patient and discussed importance of compliance Discussed plans with patient for ongoing care management follow up and provided patient with direct contact information for care management team Advised patient, providing education and rationale, to monitor blood pressure  daily and record, calling PCP for findings outside established parameters Reviewed scheduled/upcoming provider appointments including:  Screening for signs and symptoms of depression related to chronic disease state  Assessed social determinant of health barriers  Pain Interventions:  (Status:  New goal.) Long Term Goal Pain assessment performed Medications reviewed Reviewed provider established plan for pain management Discussed importance of adherence to all scheduled medical appointments Counseled on the importance of reporting any/all new or changed pain symptoms or management strategies to pain management provider Advised patient to report to care  team affect of pain on daily activities Reviewed with patient prescribed pharmacological and nonpharmacological pain relief strategies Screening for signs and symptoms of depression related to chronic disease state  Assessed social determinant of health barriers   Self Care Activities: Over the next 90 days, patient will:  -Self administers medications as prescribed Attends all scheduled provider appointments Calls pharmacy for medication refills Calls provider office for new concerns or questions Patient will locate meter to check blood sugars-completed Schedule eye provider appt. Follow up with provider regarding blood sugars-completed  Follow Up Plan: The Managed Medicaid care management team will reach out to the patient again over the next 30  business days.  The patient has been provided with contact information for the Managed Medicaid care management team and has been advised to call with any health related questions or concerns.    Follow Up:  Patient agrees to Care Plan and Follow-up.  Plan: The Managed Medicaid care management team will reach out to the patient again over the next 30 business  days. and The  Patient has been provided with contact information for the Managed Medicaid care management team and has been advised to  call with any health related questions or concerns.  Date/time of next scheduled RN care management/care coordination outreach:  12/13/22 at 115

## 2022-11-08 NOTE — Patient Instructions (Signed)
Hi Melinda [name], I'm glad you are doing okay today, I hope you have a nice afternoon!  Melinda [name] was given information about Medicaid Managed Care team care coordination services as a part of their [facility] Community Plan Medicaid benefit. Melinda [name] verbally consented to engagement with the [facility] Managed Care team.   If you are experiencing a medical emergency, please call 911 or report to your local emergency department or urgent care.   If you have a non-emergency medical problem during routine business hours, please contact your provider's office and ask to speak with a nurse.   For questions related to your [facility], please call: [phone] or visit the homepage here: [email or web address]  If you would like to schedule transportation through your [facility], please call the following number at least 2 days in advance of your appointment: [phone]   Rides for urgent appointments can also be made after hours by calling Member Services.  Call the Behavioral Health Crisis Line at [phone], at any time, 24 hours a day, 7 days a week. If you are in danger or need immediate medical attention call 911.  If you would like help to quit smoking, call 1-800-QUIT-NOW (469-117-6060) OR Espaol: 1-855-Djelo-Ya (1-324-401-0272) o para ms informacin haga clic aqu or Text READY to 536-644 to register via text  Melinda [name] - following are the goals we discussed in your visit today:   Goals Addressed             This Visit's Progress    Protect My Health        Timeframe:  Long-Range Goal Priority:  Medium Start Date:    07/14/20                         Expected End Date:  ongoing          Follow Up Date: 12/13/22 - schedule appointment for vaccines needed due to my age or health - schedule recommended health tests (blood work, mammogram, colonoscopy, pap test) -  schedule and keep appointment for annual check-up  11/08/22:  seen by PCP 5/3, upcoming appt with Podiatry    Patient verbalizes understanding of instructions and care plan provided today and agrees to view in MyChart. Active MyChart status and patient understanding of how to access instructions and care plan via MyChart confirmed with patient.     The Managed Medicaid care management team will reach out to the patient again over the next 30 business  days.  The  Patient  has been provided with contact information for the Managed Medicaid care management team and has been advised to call with any health related questions or concerns.   [name], BSN [facility]  [facility] Care Management Coordinator - Managed Medicaid High Risk [phone]   Following is a copy of your plan of care:  Care Plan : General Plan of Care (Adult)  Updates made by [name], RN since 11/08/2022 12:00 AM     Problem: Health Promotion or Disease Self-Management (General Plan of Care)   Priority: Medium  Onset Date: 10/20/2020     Long-Range Goal: Chronic Disease Management   Start Date: 07/14/2020  Expected End Date: 02/08/2023  Recent Progress: On track  Priority: Medium  Note:    Current Barriers:  Chronic Disease Management support and education needs related to DM, HTN, chronic pain, migraines, asthma, HLD, anxiety, depression Patient  with anxiety and depression-has appointment with Psychiatrist  scheduled every 3 months, therapist every 2 weeks 11/08/22:  Patient with no complaints today.  To schedule eye appt. Checks blood sugar occasionally-no readings available today. Breathing WNL.   Nurse Case Manager Clinical Goal(s):  Over the next 90 days, patient will attend all scheduled medical appointments:  Over the next 30 days, patient will work with CM team pharmacist to review medications  Interventions:  Inter-disciplinary care team collaboration (see longitudinal plan of  care) Evaluation of current treatment plan  and patient's adherence to plan as established by provider. Reviewed medications with patient. Collaborated with pharmacy regarding medications. Discussed plans with patient for ongoing care management follow up and provided patient with direct contact information for care management team Reviewed scheduled/upcoming provider appointments. Collaborated with SW for psychotherapy referral. SW referral for psychotherapy provider-completed Pharmacy referral for medication review-completed Collaborated with PCP for new BP cuff-completed  Asthma: (Status:New goal.) Long Term Goal Advised patient to track and manage Asthma triggers Provided education about and advised patient to utilize infection prevention strategies to reduce risk of respiratory infection Discussed the importance of adequate rest and management of fatigue with Asthma Screening for signs and symptoms of depression related to chronic disease state  Assessed social determinant of health barriers   Diabetes Interventions:  (Status:  New goal.) Long Term Goal Assessed patient's understanding of A1c goal: <7% Reviewed medications with patient and discussed importance of medication adherence Discussed plans with patient for ongoing care management follow up and provided patient with direct contact information for care management team Reviewed scheduled/upcoming provider appointments  Advised patient, providing education and rationale, to check cbg as directed  and record, calling provider  for findings outside established parameters Review of patient status, including review of consultants reports, relevant laboratory and other test results, and medications completed Screening for signs and symptoms of depression related to chronic disease state  Assessed social determinant of health barriers Lab Results  Component Value Date   HGBA1C 6.1 04/20/2021       HGBA1C                                                       6.2                                    09/26/22  Hyperlipidemia Interventions:  (Status:  New goal.) Long Term Goal Medication review performed; medication list updated in electronic medical record.  Provider established cholesterol goals reviewed Counseled on importance of regular laboratory monitoring as prescribed Reviewed importance of limiting foods high in cholesterol Screening for signs and symptoms of depression related to chronic disease state Assessed social determinant of health barriers   Hypertension Interventions:  (Status:  New goal.) Long Term Goal Last practice recorded BP readings:  BP Readings from Last 3 Encounters:  02/01/22 111/77  01/24/22 128/83  01/21/22 104/73  10/09/22         115/69 11/08/22:        140/68  Most recent eGFR/CrCl:  Lab Results  Component Value Date   EGFR 68 12/19/2020    No components found for: "CRCL"  Evaluation of current treatment plan related to hypertension self management and patient's adherence to plan as  established by provider Reviewed medications with patient and discussed importance of compliance Discussed plans with patient for ongoing care management follow up and provided patient with direct contact information for care management team Advised patient, providing education and rationale, to monitor blood pressure daily and record, calling PCP for findings outside established parameters Reviewed scheduled/upcoming provider appointments including:  Screening for signs and symptoms of depression related to chronic disease state  Assessed social determinant of health barriers  Pain Interventions:  (Status:  New goal.) Long Term Goal Pain assessment performed Medications reviewed Reviewed provider established plan for pain management Discussed importance of adherence to all scheduled medical appointments Counseled on the importance of reporting any/all new or changed pain symptoms or management  strategies to pain management provider Advised patient to report to care team affect of pain on daily activities Reviewed with patient prescribed pharmacological and nonpharmacological pain relief strategies Screening for signs and symptoms of depression related to chronic disease state  Assessed social determinant of health barriers   Self Care Activities: Over the next 90 days, patient will:  -Self administers medications as prescribed Attends all scheduled provider appointments Calls pharmacy for medication refills Calls provider office for new concerns or questions Patient will locate meter to check blood sugars-completed Schedule eye provider appt. Follow up with provider regarding blood sugars-completed  Follow Up Plan: The Managed Medicaid care management team will reach out to the patient again over the next 30  business days.  The patient has been provided with contact information for the Managed Medicaid care management team and has been advised to call with any health related questions or concerns.

## 2022-11-09 DIAGNOSIS — R269 Unspecified abnormalities of gait and mobility: Secondary | ICD-10-CM | POA: Diagnosis not present

## 2022-11-11 ENCOUNTER — Ambulatory Visit (INDEPENDENT_AMBULATORY_CARE_PROVIDER_SITE_OTHER): Payer: Medicaid Other | Admitting: Surgery

## 2022-11-11 ENCOUNTER — Encounter: Payer: Self-pay | Admitting: Surgery

## 2022-11-11 VITALS — BP 160/81 | HR 98 | Temp 98.3°F | Ht 70.0 in | Wt 319.0 lb

## 2022-11-11 DIAGNOSIS — D3500 Benign neoplasm of unspecified adrenal gland: Secondary | ICD-10-CM

## 2022-11-11 DIAGNOSIS — R269 Unspecified abnormalities of gait and mobility: Secondary | ICD-10-CM | POA: Diagnosis not present

## 2022-11-11 DIAGNOSIS — M79652 Pain in left thigh: Secondary | ICD-10-CM

## 2022-11-11 DIAGNOSIS — R600 Localized edema: Secondary | ICD-10-CM | POA: Diagnosis not present

## 2022-11-11 NOTE — Patient Instructions (Addendum)
Your Ultrasound is scheduled for 11/13/2022 3pm (arrive by 2:45pm) at [facility].   If you have any concerns or questions, please feel free to call our office.   Deep Vein Thrombosis  Deep vein thrombosis (DVT) is a condition in which a blood clot forms in a vein of the deep venous system. This can occur in the lower leg, thigh, pelvis, arm, or neck. A clot is blood that has thickened into a gel or solid. This condition is serious and can be life-threatening if the clot travels to the arteries of the lungs and causes a blockage (pulmonary embolism). A DVT can also damage veins in the leg, which can lead to long-term venous disease, leg pain, swelling, discoloration, and ulcers or sores (post-thrombotic syndrome). What are the causes? This condition may be caused by: A slowdown of blood flow. Damage to a vein. A condition that causes blood to clot more easily, such as certain bleeding disorders. What increases the risk? The following factors may make you more likely to develop this condition: Obesity. Being older, especially older than age 80. Being inactive or not moving around (sedentary lifestyle). This may include: Sitting or lying down for longer than 4-6 hours other than to sleep at night. Being in the hospital, or having major or lengthy surgery. Having any recent bone injuries, such as breaks (fractures), that reduce movement, especially in the lower extremities. Having recent orthopedic surgery on the lower extremities. Being pregnant, giving birth, or having recently given birth. Taking medicines that contain estrogen, such as birth control or hormone replacement therapy. Using products that contain nicotine or tobacco, especially if you use hormonal birth control. Having a history of a blood vessel disease (peripheral vascular disease) or congestive heart disease. Having a history of cancer, especially if being treated with chemotherapy. What are the signs or  symptoms? Symptoms of this condition include: Swelling, pain, pressure, or tenderness in an arm or a leg. An arm or a leg becoming warm, red, or discolored. A leg turning very pale or blue. You may have a large DVT. This is rare. If the clot is in your leg, you may notice that symptoms get worse when you stand or walk. In some cases, there are no symptoms. How is this diagnosed? This condition is diagnosed with: Your medical history and a physical exam. Tests, such as: Blood tests to check how well your blood clots. Doppler ultrasound. This is the best way to find a DVT. CT venogram. Contrast dye is injected into a vein, and X-rays are taken to check for clots. This is helpful for veins in the chest or pelvis. How is this treated? Treatment for this condition depends on: The cause of your DVT. The size and location of your DVT, or having more than one DVT. Your risk for bleeding or developing more clots. Other medical conditions you may have. Treatment may include: Taking a blood thinner medicine (anticoagulant) to prevent more clots from forming or current clots from growing. Wearing compression stockings. Injecting medicines into the affected vein to break up the clot (catheter-directed thrombolysis). Surgical procedures, when DVT is severe or hard to treat. These may be done to: Isolate and remove your clot. Place an inferior vena cava (IVC) filter. This filter is placed into a large vein called the inferior vena cava to catch blood clots before they reach your lungs. You may get some medical treatments for 6 months or longer. Follow these instructions at home: If you are taking blood  thinners: Talk with your health care provider before you take any medicines that contain aspirin or NSAIDs, such as ibuprofen. These medicines increase your risk for dangerous bleeding. Take your medicine exactly as told, at the same time every day. Do not skip a dose. Do not take more than the  prescribed dose. This is important. Ask your health care provider about foods and medicines that could change or interact with the way your blood thinner works. Avoid these foods and medicines if you are told to do so. Avoid anything that may cause bleeding or bruising. You may bleed more easily while taking blood thinners. Be very careful when using knives, scissors, or other sharp objects. Use an electric razor instead of a blade. Avoid activities that could cause injury or bruising, and follow instructions for preventing falls. Tell your health care provider if you have had any internal bleeding, bleeding ulcers, or neurologic diseases, such as strokes or cerebral aneurysms. Wear a medical alert bracelet or carry a card that lists what medicines you take. General instructions Take over-the-counter and prescription medicines only as told by your health care provider. Return to your normal activities as told by your health care provider. Ask your health care provider what activities are safe for you. If recommended, wear compression stockings as told by your health care provider. These stockings help to prevent blood clots and reduce swelling in your legs. Never wear your compression stockings while sleeping at night. Keep all follow-up visits. This is important. Where to find more information American Heart Association: www.heart.org Centers for Disease Control and Prevention: FootballExhibition.com.br National Heart, Lung, and Blood Institute: PopSteam.is Contact a health care provider if: You miss a dose of your blood thinner. You have unusual bruising or other color changes. You have new or worse pain, swelling, or redness in an arm or a leg. You have worsening numbness or tingling in an arm or a leg. You have a significant color change (pale or blue) in the extremity that has the DVT. Get help right away if: You have signs or symptoms that a blood clot has moved to the lungs. These may  include: Shortness of breath. Chest pain. Fast or irregular heartbeats (palpitations). Light-headedness, dizziness, or fainting. Coughing up blood. You have signs or symptoms that your blood is too thin. These may include: Blood in your vomit, stool, or urine. A cut that will not stop bleeding. A menstrual period that is heavier than usual. A severe headache or confusion. These symptoms may be an emergency. Get help right away. Call 911. Do not wait to see if the symptoms will go away. Do not drive yourself to the hospital. Summary Deep vein thrombosis (DVT) happens when a blood clot forms in a deep vein. This may occur in the lower leg, thigh, pelvis, arm, or neck. Symptoms affect the arm or leg and can include swelling, pain, tenderness, warmth, redness, or discoloration. This condition may be treated with medicines. In severe cases, a procedure or surgery may be done to remove or dissolve the clots. If you are taking blood thinners, take them exactly as told. Do not skip a dose. Do not take more than is prescribed. Get help right away if you have a severe headache, shortness of breath, chest pain, fast or irregular heartbeats, or blood in your vomit, urine, or stool. This information is not intended to replace advice given to you by your health care provider. Make sure you discuss any questions you have with your health care  provider. Document Revised: 01/08/2021 Document Reviewed: 01/08/2021 Elsevier Patient Education  2023 [employer].

## 2022-11-12 DIAGNOSIS — R269 Unspecified abnormalities of gait and mobility: Secondary | ICD-10-CM | POA: Diagnosis not present

## 2022-11-12 NOTE — Progress Notes (Unsigned)
Outpatient Surgical Follow Up  11/12/2022  Melinda [name] is an 57 y.o. female.   Chief Complaint  Patient presents with   New Patient (Initial Visit)    Benign neoplasm right adrenal gland    HPI: ***  Past Medical History:  Diagnosis Date   ADHD (attention deficit hyperactivity disorder)    Arthritis    Asthma    Cancer (HCC)    Thyroid   Chicken pox AGE 84   COPD (chronic obstructive pulmonary disease) (HCC)    pt reported   Diabetes mellitus without complication (HCC)    Diverticulosis, sigmoid    GERD (gastroesophageal reflux disease)    Glaucoma    BOTH EYES, NO EYE DROPS   Glaucoma    History of blood transfusion 08/2014    2UNITS GIVEN AND IRON GIVEN   Hyperglycemia 09/09/2014   Hypertension    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29   HAD TO LEARN TO WALK AGAIN   Iron deficiency anemia due to chronic blood loss 09/09/2014   Low TSH level 09/08/2014   Lumbar herniated disc    Menorrhagia 09/08/2014   Migraine    CLUSTER AND MIGRAINES   Multiple thyroid nodules    Ovarian mass, right 09/09/2014   PE (pulmonary embolism) 2016   Peripheral neuropathy    FINGER TIPS AND TOES NUMB SOME   Preeclampsia 1983   WITH PREGNANCY   PTSD (post-traumatic stress disorder)    Scoliosis    Seizures (HCC) AGE 84   NONE SINCE, HAD CHICKEN POX THEN    Past Surgical History:  Procedure Laterality Date   ANGIOGRAM TO LEG  08-13-15   RIGHT   CHOLECYSTECTOMY     IR GENERIC HISTORICAL  04/04/2016   IR RADIOLOGIST EVAL & MGMT 04/04/2016 Simonne Come, MD [facility] INTERV RAD   RADIOLOGY WITH ANESTHESIA N/A 07/17/2021   Procedure: MRI LUMBAR SPINE WITHOUT CONTRAST; MRI CERVICAL SPINE WITHOUT CONTRAST WITH ANESTHESIA;  Surgeon: Radiologist, Medication, MD;  Location: [facility] OR;  Service: Radiology;  Laterality: N/A;   THYROIDECTOMY N/A 11/17/2015   Procedure: TOTAL THYROIDECTOMY;  Surgeon: Darnell Level, MD;  Location: [facility] ORS;  Service: General;   Laterality: N/A;   UTERINE ARTERY EMBOLIZATION Bilateral 08/13/2015    Family History  Problem Relation Age of Onset   Diabetes Mother    Breast cancer Mother    CAD Mother    Hypertension Mother    Alcohol abuse Father    Hypertension Father    Breast cancer Maternal Aunt    Breast cancer Maternal Aunt    Kidney nephrosis Son    Diabetes Son     Social History:  reports that she quit smoking about 8 years ago. Her smoking use included cigarettes. She has a 20.00 pack-year smoking history. She has been exposed to tobacco smoke. She has never used smokeless tobacco. She reports that she does not drink alcohol and does not use drugs.  Allergies:  Allergies  Allergen Reactions   Ceftriaxone Anaphylaxis and Other (See Comments)    ROCEPHIN   Metformin Anaphylaxis    ALL   Penicillins Shortness Of Breath    Has patient had a PCN reaction causing immediate rash, facial/tongue/throat swelling, SOB or lightheadedness with hypotension: Yes Has patient had a PCN reaction causing severe rash involving mucus membranes or skin necrosis: No Has patient had a PCN reaction that required hospitalization No Has patient had a PCN reaction occurring within the last 10  years: No If all of the above answers are "NO", then may proceed with Cephalosporin use.    Shellfish Allergy Anaphylaxis   Flonase [Fluticasone Propionate]     Makes migraines worse   Gold-Containing Drug Products     HANDS ITCH   Metformin Hcl Other (See Comments)   Nickel     HANDS SWELL   Penicillin G Other (See Comments)   Citrus Rash    Medications reviewed.    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 She has improved regarding her wieght and her BMI is now 45..    I will hope to see her back in about 3 months at that time may revisit excision of adrenal mass.  Discussed with patient detail.  Please note that I spent 40 minutes in visit including coordination of care personally reviewing, placing orders and performing appropriate    Sterling Big, MD [facility] General Surgeon

## 2022-11-13 ENCOUNTER — Ambulatory Visit
Admission: RE | Admit: 2022-11-13 | Discharge: 2022-11-13 | Disposition: A | Discharge status: Home / Self Care | Payer: Medicaid Other | Source: Ambulatory Visit | Attending: Surgery | Admitting: Surgery

## 2022-11-13 DIAGNOSIS — M79662 Pain in left lower leg: Secondary | ICD-10-CM | POA: Diagnosis not present

## 2022-11-13 DIAGNOSIS — M79652 Pain in left thigh: Secondary | ICD-10-CM | POA: Diagnosis present

## 2022-11-13 DIAGNOSIS — R269 Unspecified abnormalities of gait and mobility: Secondary | ICD-10-CM | POA: Diagnosis not present

## 2022-11-14 DIAGNOSIS — R269 Unspecified abnormalities of gait and mobility: Secondary | ICD-10-CM | POA: Diagnosis not present

## 2022-11-15 DIAGNOSIS — R269 Unspecified abnormalities of gait and mobility: Secondary | ICD-10-CM | POA: Diagnosis not present

## 2022-11-16 DIAGNOSIS — R269 Unspecified abnormalities of gait and mobility: Secondary | ICD-10-CM | POA: Diagnosis not present

## 2022-11-18 DIAGNOSIS — R269 Unspecified abnormalities of gait and mobility: Secondary | ICD-10-CM | POA: Diagnosis not present

## 2022-11-19 DIAGNOSIS — F4325 Adjustment disorder with mixed disturbance of emotions and conduct: Secondary | ICD-10-CM | POA: Diagnosis not present

## 2022-11-19 DIAGNOSIS — R269 Unspecified abnormalities of gait and mobility: Secondary | ICD-10-CM | POA: Diagnosis not present

## 2022-11-20 ENCOUNTER — Other Ambulatory Visit: Payer: Self-pay | Admitting: Family Medicine

## 2022-11-20 DIAGNOSIS — R269 Unspecified abnormalities of gait and mobility: Secondary | ICD-10-CM | POA: Diagnosis not present

## 2022-11-21 DIAGNOSIS — R269 Unspecified abnormalities of gait and mobility: Secondary | ICD-10-CM | POA: Diagnosis not present

## 2022-11-22 DIAGNOSIS — R269 Unspecified abnormalities of gait and mobility: Secondary | ICD-10-CM | POA: Diagnosis not present

## 2022-11-23 DIAGNOSIS — R269 Unspecified abnormalities of gait and mobility: Secondary | ICD-10-CM | POA: Diagnosis not present

## 2022-11-30 DIAGNOSIS — R269 Unspecified abnormalities of gait and mobility: Secondary | ICD-10-CM | POA: Diagnosis not present

## 2022-12-02 ENCOUNTER — Other Ambulatory Visit: Payer: Self-pay | Admitting: Family Medicine

## 2022-12-02 DIAGNOSIS — R269 Unspecified abnormalities of gait and mobility: Secondary | ICD-10-CM | POA: Diagnosis not present

## 2022-12-03 DIAGNOSIS — R269 Unspecified abnormalities of gait and mobility: Secondary | ICD-10-CM | POA: Diagnosis not present

## 2022-12-03 DIAGNOSIS — F4325 Adjustment disorder with mixed disturbance of emotions and conduct: Secondary | ICD-10-CM | POA: Diagnosis not present

## 2022-12-03 NOTE — Telephone Encounter (Signed)
Requested medications are due for refill today.  yes  Requested medications are on the active medications list.  yes  Last refill. 08/26/2022 #100 0 rf  Future visit scheduled.   no  Notes to clinic.  Evangeline Dakin listed as PCP.    Requested Prescriptions  Pending Prescriptions Disp Refills   Cholecalciferol (VITAMIN D3) 10 MCG (400 UNIT) CAPS [Pharmacy Med Name: VITAMIN D3 10 MCG (400 UNIT) ORAL CAPSULE] 100 capsule 0    Sig: TAKE ONE CAPSULE BY MOUTH ONCE DAILY (AM)     Endocrinology:  Vitamins - Vitamin D Supplementation 2 Failed - 12/02/2022 12:37 PM      Failed - Manual Review: Route requests for 50,000 IU strength to the provider      Failed - Ca in normal range and within 360 days    Calcium  Date Value Ref Range Status  10/11/2022 8.4 (L) 8.9 - 10.3 mg/dL Final   Calcium, Ion  Date Value Ref Range Status  10/11/2022 1.10 (L) 1.15 - 1.40 mmol/L Final         Failed - Vitamin D in normal range and within 360 days    No results found for: "ZO1096EA5", "WU9811BJ4", "[id number]", "25OHVITD3", "25OHVITD2", "25OHVITD1", "VD25OH"       Passed - Valid encounter within last 12 months    Recent Outpatient Visits           11 months ago Type 2 diabetes mellitus with morbid obesity (HCC)   [place] [facility] Jonah Blue B, MD   1 year ago Type 2 diabetes mellitus with morbid obesity [facility])   [place] [facility] [name], MD   1 year ago Bulging lumbar disc   [place] [facility] [place], [place], [place]   1 year ago Type 2 diabetes mellitus with morbid obesity [facility])   [facility] [name], MD   1 year ago Poor sleep   [place] [facility] [name], MD

## 2022-12-04 DIAGNOSIS — R269 Unspecified abnormalities of gait and mobility: Secondary | ICD-10-CM | POA: Diagnosis not present

## 2022-12-05 DIAGNOSIS — R269 Unspecified abnormalities of gait and mobility: Secondary | ICD-10-CM | POA: Diagnosis not present

## 2022-12-06 DIAGNOSIS — R269 Unspecified abnormalities of gait and mobility: Secondary | ICD-10-CM | POA: Diagnosis not present

## 2022-12-07 DIAGNOSIS — R269 Unspecified abnormalities of gait and mobility: Secondary | ICD-10-CM | POA: Diagnosis not present

## 2022-12-09 DIAGNOSIS — R269 Unspecified abnormalities of gait and mobility: Secondary | ICD-10-CM | POA: Diagnosis not present

## 2022-12-10 DIAGNOSIS — E890 Postprocedural hypothyroidism: Secondary | ICD-10-CM | POA: Diagnosis not present

## 2022-12-10 DIAGNOSIS — E270 Other adrenocortical overactivity: Secondary | ICD-10-CM | POA: Diagnosis not present

## 2022-12-10 DIAGNOSIS — R269 Unspecified abnormalities of gait and mobility: Secondary | ICD-10-CM | POA: Diagnosis not present

## 2022-12-10 DIAGNOSIS — D3501 Benign neoplasm of right adrenal gland: Secondary | ICD-10-CM | POA: Diagnosis not present

## 2022-12-10 DIAGNOSIS — Z85850 Personal history of malignant neoplasm of thyroid: Secondary | ICD-10-CM | POA: Diagnosis not present

## 2022-12-11 DIAGNOSIS — R269 Unspecified abnormalities of gait and mobility: Secondary | ICD-10-CM | POA: Diagnosis not present

## 2022-12-12 DIAGNOSIS — R269 Unspecified abnormalities of gait and mobility: Secondary | ICD-10-CM | POA: Diagnosis not present

## 2022-12-13 ENCOUNTER — Other Ambulatory Visit: Payer: Medicaid Other | Admitting: Obstetrics and Gynecology

## 2022-12-13 ENCOUNTER — Encounter: Payer: Self-pay | Admitting: Obstetrics and Gynecology

## 2022-12-13 DIAGNOSIS — R269 Unspecified abnormalities of gait and mobility: Secondary | ICD-10-CM | POA: Diagnosis not present

## 2022-12-13 NOTE — Patient Outreach (Signed)
Medicaid Managed Care   Nurse Care Manager Note  12/13/2022 Name:  Melinda [name] MRN:  [id number] DOB:  Jan 05, 1966  Melinda [name] is an 57 y.o. year old female who is a primary patient of [employer], [place], [place].  [facility] Managed Care Coordination team was consulted for assistance with:    Chronic healthcare management needs, h/o thyroid cancer, HLD, HTN, DM, chronic pain, migraines, anziety/depression/PTSD  Ms. Morishita was given information about Medicaid Managed Care Coordination team services today. Melinda [name] Patient agreed to services and verbal consent obtained.  Engaged with patient by telephone for follow up visit in response to provider referral for case management and/or care coordination services.   Assessments/Interventions:  Review of past medical history, allergies, medications, health status, including review of consultants reports, laboratory and other test data, was performed as part of comprehensive evaluation and provision of chronic care management services.  SDOH (Social Determinants of Health) assessments and interventions performed: SDOH Interventions    Flowsheet Row Patient Outreach Telephone from 12/13/2022 in [place] POPULATION HEALTH DEPARTMENT Patient Outreach Telephone from 11/08/2022 in [place] POPULATION HEALTH DEPARTMENT Patient Outreach Telephone from 10/10/2022 in [place] POPULATION HEALTH DEPARTMENT Patient Outreach Telephone from 09/02/2022 in [place] POPULATION HEALTH DEPARTMENT Patient Outreach Telephone from 08/02/2022 in [place] POPULATION HEALTH DEPARTMENT Patient Outreach Telephone from 07/05/2022 in [place] POPULATION HEALTH DEPARTMENT  SDOH Interventions        Food Insecurity Interventions -- -- -- -- Intervention Not Indicated --  Housing Interventions -- Intervention Not Indicated -- -- -- --  Transportation Interventions Intervention Not Indicated -- -- -- -- Intervention Not Indicated  Utilities Interventions -- --  Intervention Not Indicated -- -- --  Alcohol Usage Interventions -- -- -- Intervention Not Indicated (Score <7) -- --  Depression Interventions/Treatment  -- -- -- -- Currently on Treatment --  Financial Strain Interventions -- Intervention Not Indicated -- -- -- --  Physical Activity Interventions -- -- -- Other (Comments)  [not able to engage in exercise described] -- --  Stress Interventions -- -- -- -- -- Other (Comment)  [sees Psychiatrist on a regular basis on medication]     Care Plan  Allergies  Allergen Reactions   Ceftriaxone Anaphylaxis and Other (See Comments)    ROCEPHIN   Metformin Anaphylaxis    ALL   Penicillins Shortness Of Breath    Has patient had a PCN reaction causing immediate rash, facial/tongue/throat swelling, SOB or lightheadedness with hypotension: Yes Has patient had a PCN reaction causing severe rash involving mucus membranes or skin necrosis: No Has patient had a PCN reaction that required hospitalization No Has patient had a PCN reaction occurring within the last 10 years: No If all of the above answers are "NO", then may proceed with Cephalosporin use.    Shellfish Allergy Anaphylaxis   Flonase [Fluticasone Propionate]     Makes migraines worse   Gold-Containing Drug Products     HANDS ITCH   Metformin Hcl Other (See Comments)   Nickel     HANDS SWELL   Penicillin G Other (See Comments)   Citrus Rash    Medications Reviewed Today     Reviewed by [name], RN (Registered Nurse) on 12/13/22 at 1242  Med List Status: <None>   Medication Order Taking? Sig Documenting Provider Last Dose Status Informant  acetaminophen (TYLENOL) 500 MG tablet [id number] No Take 1 tablet (500 mg total) by mouth every 6 (six) hours as needed. Otelia Sergeant,  Guy Sandifer, MD Taking Active   AIMOVIG 140 MG/ML SOAJ [id number] No INJECT 140 MG INTO THE SKIN EVERY 28 (TWENTY-EIGHT) DAYS. [name], DO Taking Active   albuterol (PROVENTIL) (2.5 MG/3ML) 0.083% nebulizer solution  [id number] No Take 3 mLs (2.5 mg total) by nebulization every 6 (six) hours as needed for wheezing or shortness of breath. [name], MD Taking Active Self  albuterol (VENTOLIN HFA) 108 (90 Base) MCG/ACT inhaler [id number] No Inhale 2 puffs into the lungs every 6 (six) hours as needed for wheezing or shortness of breath. [name], MD Taking Active Self  apixaban (ELIQUIS) 5 MG TABS tablet [id number] No TAKE ONE TABLET BY MOUTH TWICE DAILY MORNING AND [name], MD Taking Active   atorvastatin (LIPITOR) 20 MG tablet [id number] No TAKE ONE TABLET BY MOUTH ONCE DAILY AT BEDTIME [name], MD Taking Active   budesonide-formoterol (SYMBICORT) 160-4.5 MCG/ACT inhaler [id number] No Inhale 2 puffs into the lungs 2 (two) times daily. [name], MD Taking Active   buPROPion (WELLBUTRIN XL) 300 MG 24 hr tablet [id number] No Take 300 mg by mouth daily.  [provider] Taking Active Self  busPIRone (BUSPAR) 10 MG tablet [id number] No Take 10 mg by mouth 2 (two) times daily. [provider] Taking Active Self           Med Note [name], [facility]   Wed Feb 18, 2018 10:03 PM)    Cholecalciferol (VITAMIN D3) 10 MCG (400 UNIT) CAPS [id number] No TAKE ONE CAPSULE BY MOUTH ONCE DAILY (AM) [name], MD Taking Active   cyclobenzaprine (FLEXERIL) 10 MG tablet [id number] No TAKE 1 TABLET (10 MG TOTAL) BY MOUTH 3 (THREE) TIMES DAILY AS NEEDED FOR MUSCLE SPASMS. [name], MD Taking Active   doxepin (SINEQUAN) 50 MG capsule [id number] No Take 150 mg by mouth at bedtime. [provider] Taking Active Self  furosemide (LASIX) 20 MG tablet [id number] No TAKE 1 TABLET (20 MG TOTAL) BY MOUTH DAILY (AM) [name], MD Taking Active   gabapentin (NEURONTIN) 300 MG capsule [id number] No TAKE ONE CAPSULE BY MOUTH THREE TIMES DAILY (AM+NOON+BEDTIME) [name], MD Taking Active   loratadine (CLARITIN) 10 MG tablet [id number] No TAKE 1  TABLET (10 MG TOTAL) BY MOUTH DAILY (AM) [name], MD Taking Active   metoprolol tartrate (LOPRESSOR) 50 MG tablet [id number] No TAKE ONE TABLET BY MOUTH TWICE DAILY (AM+BEDTIME) [name], MD Taking Active   montelukast (SINGULAIR) 10 MG tablet [id number] No TAKE 1 TABLET BY MOUTH EVERY EVENING AT BEDTIME [name], MD Taking Active   nystatin-triamcinolone ointment [facility]) [id number] No Apply 1 Application topically 2 (two) times daily. Apply topically two times daily for five days Chelse Matas Piedra, DO Taking Active   OZEMPIC, 1 MG/DOSE, 4 MG/3ML SOPN [id number] No Inject 2 mg into the skin once a week. [provider] Taking Active Self  prazosin (MINIPRESS) 2 MG capsule [id number] No Take 6 mg by mouth at bedtime. [provider] Taking Active Self  SYNTHROID 200 MCG tablet [id number] No Take 1 tablet (200 mcg total) by mouth daily before breakfast.  Patient taking differently: Take 224 mcg by mouth daily before breakfast. Takes 2 tablets in the morning   [name], MD Taking Active Self  topiramate (TOPAMAX) 200 MG tablet [id number] No TAKE 1 TABLET (200 MG TOTAL) BY MOUTH AT BEDTIME. [name], DO Taking Active   traZODone (DESYREL) 50 MG tablet [id number] No Take  50 mg by mouth at bedtime. [provider] Taking Active Self  VRAYLAR 1.5 MG capsule [id number] No Take 1.5 mg by mouth daily. Taking 3.0 daily now [provider] Taking Active Self           Patient Active Problem List   Diagnosis Date Noted   Postoperative hypothyroidism 01/21/2022   Esophageal dysphagia 01/21/2022   History of DVT (deep vein thrombosis) 01/21/2022   History of malignant neoplasm of thyroid 01/21/2022   Long term (current) use of anticoagulants 01/21/2022   Migraine without aura, not refractory 01/21/2022   Type 2 diabetes mellitus with hyperglycemia (HCC) 01/21/2022   Spinal stenosis excluding cervical region 11/21/2021   Myelolipoma of  right adrenal gland 08/21/2021   Abdominal pannus 08/21/2021   Hardening of the aorta (main artery of the heart) (HCC) 08/21/2021   At high risk for falls 04/20/2020   Hyperlipidemia 02/16/2020   Adrenal mass, right (HCC) 04/29/2019   Chronic cough 03/12/2018   Left shoulder pain 10/14/2016   Primary hypertension 10/14/2016   S/P thyroidectomy 06/27/2016   Chronic bilateral low back pain without sciatica 04/22/2016   Liver lesion 12/24/2015   Malignant tumor of thyroid gland (HCC) 11/16/2015   Controlled type 2 diabetes mellitus with complication, with long-term current use of insulin (HCC) 10/10/2015   Vision loss 08/25/2015   Decreased vision in both eyes 08/25/2015   Prolonged Q-T interval on ECG 08/19/2015   History of pulmonary embolism 08/10/2015   Mild persistent asthma 04/13/2015   Nonruptured cerebral aneurysm 04/13/2015   Fibroid, uterine    Morbid obesity (HCC) 11/11/2014   Atypical chest pain 11/08/2014   Sinus tachycardia 11/08/2014   Migraine 10/14/2014   Menorrhagia 09/08/2014   Conditions to be addressed/monitored per PCP order:  Chronic healthcare management needs, h/o thyroid cancer, HLD, HTN, DM, chronic pain, migraines, anziety/depression/PTSD  Care Plan : General Plan of Care (Adult)  Updates made by [name], RN since 12/13/2022 12:00 AM     Problem: Health Promotion or Disease Self-Management (General Plan of Care)   Priority: Medium  Onset Date: 10/20/2020     Long-Range Goal: Chronic Disease Management   Start Date: 07/14/2020  Expected End Date: 02/08/2023  Recent Progress: On track  Priority: Medium  Note:    Current Barriers:  Chronic Disease Management support and education needs related to DM, HTN, chronic pain, migraines, asthma, HLD, anxiety, depression Patient with anxiety and depression-has appointment with Psychiatrist  scheduled every 3 months, therapist every 2 weeks 12/13/22:  Blood sugars 200s per patient-Ozempic dose increased  to 2.0,  BP stable.  Patient with bilateral edema to calf-US done and negative for DVT in May-will f/u with provider.  Denies breathing difficulty, pain, redness, heat.   Nurse Case Manager Clinical Goal(s):  Over the next 90 days, patient will attend all scheduled medical appointments:  Over the next 30 days, patient will work with CM team pharmacist to review medications  Interventions:  Inter-disciplinary care team collaboration (see longitudinal plan of care) Evaluation of current treatment plan  and patient's adherence to plan as established by provider. Reviewed medications with patient. Collaborated with pharmacy regarding medications. Discussed plans with patient for ongoing care management follow up and provided patient with direct contact information for care management team Reviewed scheduled/upcoming provider appointments. Collaborated with SW for psychotherapy referral. SW referral for psychotherapy provider-completed Pharmacy referral for medication review-completed Collaborated with PCP for new BP cuff-completed  Asthma: (Status:New goal.) Long Term Goal Advised patient  to track and manage Asthma triggers Provided education about and advised patient to utilize infection prevention strategies to reduce risk of respiratory infection Discussed the importance of adequate rest and management of fatigue with Asthma Screening for signs and symptoms of depression related to chronic disease state  Assessed social determinant of health barriers   Diabetes Interventions:  (Status:  New goal.) Long Term Goal Assessed patient's understanding of A1c goal: <7% Reviewed medications with patient and discussed importance of medication adherence Discussed plans with patient for ongoing care management follow up and provided patient with direct contact information for care management team Reviewed scheduled/upcoming provider appointments  Advised patient, providing education and rationale,  to check cbg as directed  and record, calling provider  for findings outside established parameters Review of patient status, including review of consultants reports, relevant laboratory and other test results, and medications completed Screening for signs and symptoms of depression related to chronic disease state  Assessed social determinant of health barriers Lab Results  Component Value Date   HGBA1C 6.1 04/20/2021       HGBA1C                                                      6.2                                    09/26/22  Hyperlipidemia Interventions:  (Status:  New goal.) Long Term Goal Medication review performed; medication list updated in electronic medical record.  Provider established cholesterol goals reviewed Counseled on importance of regular laboratory monitoring as prescribed Reviewed importance of limiting foods high in cholesterol Screening for signs and symptoms of depression related to chronic disease state Assessed social determinant of health barriers   Hypertension Interventions:  (Status:  New goal.) Long Term Goal Last practice recorded BP readings:  BP Readings from Last 3 Encounters:  02/01/22 111/77  01/24/22 128/83  01/21/22 104/73  10/09/22         115/69 11/08/22:        140/68  Most recent eGFR/CrCl:  Lab Results  Component Value Date   EGFR 68 12/19/2020    No components found for: "CRCL"  Evaluation of current treatment plan related to hypertension self management and patient's adherence to plan as established by provider Reviewed medications with patient and discussed importance of compliance Discussed plans with patient for ongoing care management follow up and provided patient with direct contact information for care management team Advised patient, providing education and rationale, to monitor blood pressure daily and record, calling PCP for findings outside established parameters Reviewed scheduled/upcoming provider appointments  including:  Screening for signs and symptoms of depression related to chronic disease state  Assessed social determinant of health barriers  Pain Interventions:  (Status:  New goal.) Long Term Goal Pain assessment performed Medications reviewed Reviewed provider established plan for pain management Discussed importance of adherence to all scheduled medical appointments Counseled on the importance of reporting any/all new or changed pain symptoms or management strategies to pain management provider Advised patient to report to care team affect of pain on daily activities Reviewed with patient prescribed pharmacological and nonpharmacological pain relief strategies Screening for signs and symptoms of depression related to chronic disease state  Assessed  social determinant of health barriers   Self Care Activities: Over the next 90 days, patient will:  -Self administers medications as prescribed Attends all scheduled provider appointments Calls pharmacy for medication refills Calls provider office for new concerns or questions Patient will locate meter to check blood sugars-completed Schedule eye provider appt.-completed Follow up with provider regarding blood sugars-completed  Follow Up Plan: The Managed Medicaid care management team will reach out to the patient again over the next 30  business days.  The patient has been provided with contact information for the Managed Medicaid care management team and has been advised to call with any health related questions or concerns.    Follow Up:  Patient agrees to Care Plan and Follow-up.  Plan: The Managed Medicaid care management team will reach out to the patient again over the next 30 business  days. and The  Patient has been provided with contact information for the Managed Medicaid care management team and has been advised to call with any health related questions or concerns.  Date/time of next scheduled RN care management/care  coordination outreach:  01/13/23 at 1230

## 2022-12-13 NOTE — Patient Instructions (Signed)
Hi Melinda [name], thanks for speaking with me-please keep an eye on your swelling, if you develop any redness, pain, warmness in your calves where your swelling is, please let your provider know as soon as possible or any breathing issues.  [name] was given information about Medicaid Managed Care team care coordination services as a part of their [facility] Medicaid benefit. [name] verbally consented to engagement with the [facility] Managed Care team.   If you are experiencing a medical emergency, please call 911 or report to your local emergency department or urgent care.   If you have a non-emergency medical problem during routine business hours, please contact your provider's office and ask to speak with a nurse.   For questions related to your [facility], please call: [phone] or visit the homepage here: [email or web address]  If you would like to schedule transportation through your [facility], please call the following number at least 2 days in advance of your appointment: [phone]   Rides for urgent appointments can also be made after hours by calling Member Services.  Call the Behavioral Health Crisis Line at [phone], at any time, 24 hours a day, 7 days a week. If you are in danger or need immediate medical attention call 911.  If you would like help to quit smoking, call 1-800-QUIT-NOW (782 746 7680) OR Espaol: 1-855-Djelo-Ya (7-253-664-4034) o para ms informacin haga clic aqu or Text READY to 742-595 to register via text  [name] - following are the goals we discussed in your visit today:   Goals Addressed             This Visit's Progress    Protect My Health         Timeframe:  Long-Range Goal Priority:  Medium Start Date:    07/14/20                         Expected End Date:  ongoing          Follow Up Date: 01/13/23 -  schedule appointment for vaccines needed due to my age or health - schedule recommended health tests (blood work, mammogram, colonoscopy, pap test) - schedule and keep appointment for annual check-up  56/14/24:  Upcoming appts with Podiatry, ENDO, Ophthalmology    Patient verbalizes understanding of instructions and care plan provided today and agrees to view in MyChart. Active MyChart status and patient understanding of how to access instructions and care plan via MyChart confirmed with patient.     The Managed Medicaid care management team will reach out to the patient again over the next 30 business  days.  The  Patient  has been provided with contact information for the Managed Medicaid care management team and has been advised to call with any health related questions or concerns.   [name], BSN [place]  [facility] Care Management Coordinator - Managed Medicaid High Risk [phone]   Following is a copy of your plan of care:  Care Plan : General Plan of Care (Adult)  Updates made by [name], RN since 12/13/2022 12:00 AM     Problem: Health Promotion or Disease Self-Management (General Plan of Care)   Priority: Medium  Onset Date: 10/20/2020     Long-Range Goal: Chronic Disease Management   Start Date: 07/14/2020  Expected End Date: 02/08/2023  Recent Progress: On track  Priority: Medium  Note:  Current Barriers:  Chronic Disease Management support and education needs related to DM, HTN, chronic pain, migraines, asthma, HLD, anxiety, depression Patient with anxiety and depression-has appointment with Psychiatrist  scheduled every 3 months, therapist every 2 weeks 12/13/22:  Blood sugars 200s per patient-Ozempic dose increased to 2.0,  BP stable.  Patient with bilateral edema to calf-US done and negative for DVT in May-will f/u with provider.  Denies breathing difficulty, pain, redness, heat.   Nurse Case Manager Clinical Goal(s):  Over the  next 90 days, patient will attend all scheduled medical appointments:  Over the next 30 days, patient will work with CM team pharmacist to review medications  Interventions:  Inter-disciplinary care team collaboration (see longitudinal plan of care) Evaluation of current treatment plan  and patient's adherence to plan as established by provider. Reviewed medications with patient. Collaborated with pharmacy regarding medications. Discussed plans with patient for ongoing care management follow up and provided patient with direct contact information for care management team Reviewed scheduled/upcoming provider appointments. Collaborated with SW for psychotherapy referral. SW referral for psychotherapy provider-completed Pharmacy referral for medication review-completed Collaborated with PCP for new BP cuff-completed  Asthma: (Status:New goal.) Long Term Goal Advised patient to track and manage Asthma triggers Provided education about and advised patient to utilize infection prevention strategies to reduce risk of respiratory infection Discussed the importance of adequate rest and management of fatigue with Asthma Screening for signs and symptoms of depression related to chronic disease state  Assessed social determinant of health barriers   Diabetes Interventions:  (Status:  New goal.) Long Term Goal Assessed patient's understanding of A1c goal: <7% Reviewed medications with patient and discussed importance of medication adherence Discussed plans with patient for ongoing care management follow up and provided patient with direct contact information for care management team Reviewed scheduled/upcoming provider appointments  Advised patient, providing education and rationale, to check cbg as directed  and record, calling provider  for findings outside established parameters Review of patient status, including review of consultants reports, relevant laboratory and other test results, and  medications completed Screening for signs and symptoms of depression related to chronic disease state  Assessed social determinant of health barriers Lab Results  Component Value Date   HGBA1C 6.1 04/20/2021       HGBA1C                                                      6.2                                    09/26/22  Hyperlipidemia Interventions:  (Status:  New goal.) Long Term Goal Medication review performed; medication list updated in electronic medical record.  Provider established cholesterol goals reviewed Counseled on importance of regular laboratory monitoring as prescribed Reviewed importance of limiting foods high in cholesterol Screening for signs and symptoms of depression related to chronic disease state Assessed social determinant of health barriers   Hypertension Interventions:  (Status:  New goal.) Long Term Goal Last practice recorded BP readings:  BP Readings from Last 3 Encounters:  02/01/22 111/77  01/24/22 128/83  01/21/22 104/73  10/09/22         115/69 11/08/22:        140/68  Most  recent eGFR/CrCl:  Lab Results  Component Value Date   EGFR 68 12/19/2020    No components found for: "CRCL"  Evaluation of current treatment plan related to hypertension self management and patient's adherence to plan as established by provider Reviewed medications with patient and discussed importance of compliance Discussed plans with patient for ongoing care management follow up and provided patient with direct contact information for care management team Advised patient, providing education and rationale, to monitor blood pressure daily and record, calling PCP for findings outside established parameters Reviewed scheduled/upcoming provider appointments including:  Screening for signs and symptoms of depression related to chronic disease state  Assessed social determinant of health barriers  Pain Interventions:  (Status:  New goal.) Long Term Goal Pain assessment  performed Medications reviewed Reviewed provider established plan for pain management Discussed importance of adherence to all scheduled medical appointments Counseled on the importance of reporting any/all new or changed pain symptoms or management strategies to pain management provider Advised patient to report to care team affect of pain on daily activities Reviewed with patient prescribed pharmacological and nonpharmacological pain relief strategies Screening for signs and symptoms of depression related to chronic disease state  Assessed social determinant of health barriers   Self Care Activities: Over the next 90 days, patient will:  -Self administers medications as prescribed Attends all scheduled provider appointments Calls pharmacy for medication refills Calls provider office for new concerns or questions Patient will locate meter to check blood sugars-completed Schedule eye provider appt.-completed Follow up with provider regarding blood sugars-completed  Follow Up Plan: The Managed Medicaid care management team will reach out to the patient again over the next 30  business days.  The patient has been provided with contact information for the Managed Medicaid care management team and has been advised to call with any health related questions or concerns.

## 2022-12-16 DIAGNOSIS — R269 Unspecified abnormalities of gait and mobility: Secondary | ICD-10-CM | POA: Diagnosis not present

## 2022-12-17 ENCOUNTER — Ambulatory Visit (INDEPENDENT_AMBULATORY_CARE_PROVIDER_SITE_OTHER): Payer: Medicaid Other | Admitting: Podiatry

## 2022-12-17 VITALS — BP 137/76 | HR 100

## 2022-12-17 DIAGNOSIS — E118 Type 2 diabetes mellitus with unspecified complications: Secondary | ICD-10-CM

## 2022-12-17 DIAGNOSIS — M79675 Pain in left toe(s): Secondary | ICD-10-CM | POA: Diagnosis not present

## 2022-12-17 DIAGNOSIS — Z794 Long term (current) use of insulin: Secondary | ICD-10-CM | POA: Diagnosis not present

## 2022-12-17 DIAGNOSIS — B351 Tinea unguium: Secondary | ICD-10-CM | POA: Diagnosis not present

## 2022-12-17 DIAGNOSIS — R269 Unspecified abnormalities of gait and mobility: Secondary | ICD-10-CM | POA: Diagnosis not present

## 2022-12-17 DIAGNOSIS — M79674 Pain in right toe(s): Secondary | ICD-10-CM

## 2022-12-17 DIAGNOSIS — F4325 Adjustment disorder with mixed disturbance of emotions and conduct: Secondary | ICD-10-CM | POA: Diagnosis not present

## 2022-12-18 DIAGNOSIS — R269 Unspecified abnormalities of gait and mobility: Secondary | ICD-10-CM | POA: Diagnosis not present

## 2022-12-19 DIAGNOSIS — F411 Generalized anxiety disorder: Secondary | ICD-10-CM | POA: Diagnosis not present

## 2022-12-19 DIAGNOSIS — F4310 Post-traumatic stress disorder, unspecified: Secondary | ICD-10-CM | POA: Diagnosis not present

## 2022-12-19 DIAGNOSIS — F331 Major depressive disorder, recurrent, moderate: Secondary | ICD-10-CM | POA: Diagnosis not present

## 2022-12-19 DIAGNOSIS — R269 Unspecified abnormalities of gait and mobility: Secondary | ICD-10-CM | POA: Diagnosis not present

## 2022-12-20 DIAGNOSIS — G4733 Obstructive sleep apnea (adult) (pediatric): Secondary | ICD-10-CM | POA: Diagnosis not present

## 2022-12-20 DIAGNOSIS — R269 Unspecified abnormalities of gait and mobility: Secondary | ICD-10-CM | POA: Diagnosis not present

## 2022-12-21 DIAGNOSIS — R269 Unspecified abnormalities of gait and mobility: Secondary | ICD-10-CM | POA: Diagnosis not present

## 2022-12-22 ENCOUNTER — Encounter: Payer: Self-pay | Admitting: Podiatry

## 2022-12-22 NOTE — Progress Notes (Signed)
  Subjective:  Patient ID: Melinda [name], female    DOB: 04/29/1966,  MRN: [id number]  Melinda [name] presents to clinic today for: at risk foot care with history of diabetic neuropathy and painful elongated overgrown toenails which are tender when wearing enclosed shoe gear.  Chief Complaint  Patient presents with   Diabetes    [facility] BS - DIDN'T CHECK IT THIS MORNING A1C - 6.2 LVPCP - 09/2022    PCP is [place], [place], [place].  Allergies  Allergen Reactions   Ceftriaxone Anaphylaxis and Other (See Comments)    ROCEPHIN   Metformin Anaphylaxis    ALL   Penicillins Shortness Of Breath    Has patient had a PCN reaction causing immediate rash, facial/tongue/throat swelling, SOB or lightheadedness with hypotension: Yes Has patient had a PCN reaction causing severe rash involving mucus membranes or skin necrosis: No Has patient had a PCN reaction that required hospitalization No Has patient had a PCN reaction occurring within the last 10 years: No If all of the above answers are "NO", then may proceed with Cephalosporin use.    Shellfish Allergy Anaphylaxis   Flonase [Fluticasone Propionate]     Makes migraines worse   Gold-Containing Drug Products     HANDS ITCH   Metformin Hcl Other (See Comments)   Nickel     HANDS SWELL   Penicillin G Other (See Comments)   Citrus Rash    Review of Systems: Negative except as noted in the HPI.  Objective: No changes noted in today's physical examination. Vitals:   12/17/22 0911  BP: 137/76  Pulse: 100    Melinda [name] is a pleasant 57 y.o. female in NAD. AAO x 3.  Vascular Examination: Capillary refill time <3 seconds b/l LE. Palpable pedal pulses b/l LE. Digital hair present b/l. No pedal edema b/l. Skin temperature gradient WNL b/l. No varicosities b/l. No ischemia or gangrene noted b/l LE. No cyanosis or clubbing noted b/l LE.Marland Kitchen  Dermatological Examination: Pedal skin with normal turgor, texture and tone b/l. No open wounds. No  interdigital macerations b/l. Toenails 1-5 b/l thickened, discolored, dystrophic with subungual debris. There is pain on palpation to dorsal aspect of nailplates. No hyperkeratotic nor porokeratotic lesions present on today's visit.Marland Kitchen  Neurological Examination: Protective sensation intact with 10 gram monofilament b/l LE. Vibratory sensation intact b/l LE. Pt has subjective symptoms of neuropathy.  Musculoskeletal Examination: Muscle strength 5/5 to all LE muscle groups of right lower extremity. Muscle strength 3/5 to all LE muscle groups of left lower extremity. No pain, crepitus or joint limitation noted with ROM bilateral LE.  Assessment/Plan: 1. Pain due to onychomycosis of toenails of both feet   2. Controlled type 2 diabetes mellitus with complication, with long-term current use of insulin (HCC)    -Consent given for treatment as described below: -Examined patient. -Continue supportive shoe gear daily. -Mycotic toenails 1-5 bilaterally were debrided in length and girth with sterile nail nippers and dremel without incident. -Patient/POA to call should there be question/concern in the interim.   Return in about 3 months (around 03/19/2023).  Freddie Breech, DPM

## 2022-12-23 DIAGNOSIS — G4700 Insomnia, unspecified: Secondary | ICD-10-CM | POA: Diagnosis not present

## 2022-12-23 DIAGNOSIS — G4733 Obstructive sleep apnea (adult) (pediatric): Secondary | ICD-10-CM | POA: Diagnosis not present

## 2022-12-23 DIAGNOSIS — I10 Essential (primary) hypertension: Secondary | ICD-10-CM | POA: Diagnosis not present

## 2022-12-23 DIAGNOSIS — R269 Unspecified abnormalities of gait and mobility: Secondary | ICD-10-CM | POA: Diagnosis not present

## 2022-12-24 DIAGNOSIS — R269 Unspecified abnormalities of gait and mobility: Secondary | ICD-10-CM | POA: Diagnosis not present

## 2022-12-30 DIAGNOSIS — R269 Unspecified abnormalities of gait and mobility: Secondary | ICD-10-CM | POA: Diagnosis not present

## 2022-12-31 DIAGNOSIS — R269 Unspecified abnormalities of gait and mobility: Secondary | ICD-10-CM | POA: Diagnosis not present

## 2023-01-01 DIAGNOSIS — R269 Unspecified abnormalities of gait and mobility: Secondary | ICD-10-CM | POA: Diagnosis not present

## 2023-01-01 NOTE — Progress Notes (Signed)
NEUROLOGY FOLLOW UP OFFICE NOTE  Melinda [name] [id number]  Assessment/Plan:   1.  Suspect cervicogenic dizziness, also consider BPPV 2.  Migraine without aura, without status migrainosus, not intractable - stable 3.  Cervical spinal stenosis 4.  Cerebral aneurysm - stable    Migraine prevention:  Aimovig 140mg , topiramate 200mg  at bedtime.   2.  Migraine rescue: Tylenol with or without Flexeril 3.  Limit use of pain relievers to no more than 2 days out of week to prevent risk of rebound or medication-overuse headache. 4.  Keep headache diary 5.  Repeat CTA of head in 4 years unless indicated sooner 6.  Follow up one year.   Subjective:  Melinda [name] is a 57 year old right-handed African-American woman with type 2 diabetes mellitus, glaucoma, asthma, chronic low back pain, PTSD, PE/DVT and history of thyroid cancer status post thyroidectomy who follows up for migraines.  MRI C-spine personally reviewed.   UPDATE: Referred to PT for vestibular rehab in February.  It helped.    Intensity:  Moderate Duration:  1 hour with Tylenol w/wo Flexeril Frequency:  no more than 1 a month.   Frequency of abortive medication: Has not been treating migraines.  Seen in ED in April for weakness and lightheadedness causing fall.  CT head personally reviewed was unremarkable.  Found to have UTI and dehydrated.  Treated accordingly.  Current NSAIDS:  Contraindicated (on anticoagulation) Current analgesics:  Tylenol, tramadol Current triptans:  none Current ergotamine:  none Current anti-emetic:  Zofran ODT 4mg  Current muscle relaxants:  Flexeril Current anti-anxiolytic:  none Current sleep aide:  none Current Antihypertensive medications:  Metoprolol 50mg  twice daily, Lasix, prazosin Current Antidepressant medications:  Wellbutrin 300mg , doxepin 10mg  Current Anticonvulsant medications:  topiramate 200 mg at bedtime, gabapentin 300mg  three times daily Current anti-CGRP:  Aimovig 140mg  Current  Vitamins/Herbal/Supplements:  Ferrous sulfate Current Antihistamines/Decongestants:  Claritin, Benadryl Other therapy:  Essential oils, lavender  Sees psychiatrist and psychologist which helps   Caffeine: No Alcohol: No Smoker: No Diet: Hydrates.  No soda.  Eats baked and boiled chicken, mostly vegetables.  Little red meat only during menses.  No fried foods. Exercise: Stationary bike, limited due to chronic pain Depression: Yes; Anxiety: Yes Other pain: Chronic pain - spinal stenosis of lumbar spine Sleep hygiene: Poor.  Has night terrors secondary to PTSD.  Sleep study  showed no sleep apnea.     HISTORY: Onset:  Since her 50s Location:  Right sided Quality:  squeezing Initial Intensity:  8.5/10 (sometimes 10/10) Aura:  Sometimes preceded with visual aura (sees a pinwheel) Prodrome:  no Associated symptoms: Nausea, photophobia, phonophobia.  No worse headache of life, change in quality, wakes her up from sleep. Initial Duration:  3 hours to all day Initial Frequency:  daily Triggers:  Apples, aged cheese, menstrual cycle Relieving factors:  None Activity:  Light activity aggravates   Past NSAIDS:  ibuprofen Past analgesics:  Excedrin Past abortive triptans:  Sumatriptan tablet/NS/[place], rizatriptan 10mg  Past muscle relaxants:  no Past anti-emetic:  no Past antihypertensive medications:  no Past antidepressant medications:  amitriptyline 50mg  Past anticonvulsant medications:  no Past vitamins/Herbal/Supplements:  no Past antihistamines/decongestants:  no Other past therapies:  no   Family history of headache:  Son has migraines.   CT of head from 01/16/15 was unremarkable.   In September 2018, she developed a "thunderclap" headache.  Onset was over a month ago.  It lasts for a couple of seconds and occurs once a week.  She has not  had any vision loss, unilateral numbness or weakness or vertigo.  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  Repeat CTA of head on 02/25/2019 and 06/09/2020 were stable.  CTA head and neck on 05/11/2021 showed stable appearance of tortuous proximal right PCA with focal dilatation possibly reflecting small aneurysm.  MRI of brain showed mild chronic small vessel ischemic changes but no acute abnormality.    She reports onset of dizziness In January 2024.  She has had this before but not lasting this long.  She describes it as positional.  Spinning sensation.  Usually occurs when shifts head back or when getting up.  Also may happen while walking.  Lasts for several minutes.  It occurs daily.  She had a hearing test which revealed mild hearing loss in both ears but enough to require hearing aids.  She denies prior history of vertigo.  She has cervical spondylosis with spinal stenosis as demonstrated on MRI C-spine from 07/17/2021, followed by orthopedics.  Surgery was recommended but she needs to lose weight.    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67   HAD TO LEARN TO WALK AGAIN   Iron deficiency anemia due to chronic blood loss 09/09/2014   Low TSH level 09/08/2014   Lumbar herniated disc    Menorrhagia 09/08/2014   Migraine    CLUSTER AND MIGRAINES   Multiple thyroid nodules    Ovarian mass, right 09/09/2014   PE (pulmonary  embolism) 2016   Peripheral neuropathy    FINGER TIPS AND TOES NUMB SOME   Preeclampsia 1983   WITH PREGNANCY   PTSD (post-traumatic stress disorder)    Scoliosis    Seizures (HCC) AGE 14   NONE SINCE, HAD CHICKEN POX THEN    MEDICATIONS: Current Outpatient Medications on File Prior to Visit  Medication Sig Dispense Refill   acetaminophen (TYLENOL) 500 MG tablet Take 1 tablet (500 mg total) by mouth every 6 (six) hours as needed. 30 tablet 0   AIMOVIG 140 MG/ML SOAJ INJECT 140 MG INTO THE SKIN EVERY 28 (TWENTY-EIGHT) DAYS. 1 mL 2   albuterol (PROVENTIL) (2.5 MG/3ML) 0.083% nebulizer solution Take 3 mLs (2.5 mg total) by nebulization every 6 (six) hours as needed for wheezing or shortness of breath. 3 mL 3   albuterol (VENTOLIN HFA) 108 (90 Base) MCG/ACT inhaler Inhale 2 puffs into the lungs every 6 (six) hours as needed for wheezing or shortness of breath. 8 g 2   apixaban (ELIQUIS) 5 MG TABS tablet TAKE ONE TABLET BY MOUTH TWICE DAILY MORNING AND EVENING 60 tablet 0   atorvastatin (LIPITOR) 20 MG tablet TAKE ONE TABLET BY MOUTH ONCE DAILY AT BEDTIME 30 tablet 0  budesonide-formoterol (SYMBICORT) 160-4.5 MCG/ACT inhaler Inhale 2 puffs into the lungs 2 (two) times daily. 2 each 6   buPROPion (WELLBUTRIN XL) 300 MG 24 hr tablet Take 300 mg by mouth daily.   2   busPIRone (BUSPAR) 10 MG tablet Take 10 mg by mouth 2 (two) times daily.     Cholecalciferol (VITAMIN D3) 10 MCG (400 UNIT) CAPS TAKE ONE CAPSULE BY MOUTH ONCE DAILY (AM) 100 capsule 0   cyclobenzaprine (FLEXERIL) 10 MG tablet TAKE 1 TABLET (10 MG TOTAL) BY MOUTH 3 (THREE) TIMES DAILY AS NEEDED FOR MUSCLE SPASMS. 30 tablet 0   doxepin (SINEQUAN) 50 MG capsule Take 150 mg by mouth at bedtime.     furosemide (LASIX) 20 MG tablet TAKE 1 TABLET (20 MG TOTAL) BY MOUTH DAILY (AM) 30 tablet 0   gabapentin (NEURONTIN) 300 MG capsule TAKE ONE CAPSULE BY MOUTH THREE TIMES DAILY (AM+NOON+BEDTIME) 90 capsule 6   loratadine (CLARITIN) 10 MG  tablet TAKE 1 TABLET (10 MG TOTAL) BY MOUTH DAILY (AM) 30 tablet 0   metoprolol tartrate (LOPRESSOR) 50 MG tablet TAKE ONE TABLET BY MOUTH TWICE DAILY (AM+BEDTIME) 60 tablet 0   montelukast (SINGULAIR) 10 MG tablet TAKE 1 TABLET BY MOUTH EVERY EVENING AT BEDTIME 30 tablet 0   nystatin-triamcinolone ointment (MYCOLOG) Apply 1 Application topically 2 (two) times daily. Apply topically two times daily for five days 30 g 0   OZEMPIC, 1 MG/DOSE, 4 MG/3ML SOPN Inject 2 mg into the skin once a week.     prazosin (MINIPRESS) 2 MG capsule Take 6 mg by mouth at bedtime.     SYNTHROID 200 MCG tablet Take 1 tablet (200 mcg total) by mouth daily before breakfast. (Patient taking differently: Take 224 mcg by mouth daily before breakfast. Takes 2 tablets in the morning) 30 tablet 4   topiramate (TOPAMAX) 200 MG tablet TAKE 1 TABLET (200 MG TOTAL) BY MOUTH AT BEDTIME. 90 tablet 1   traZODone (DESYREL) 50 MG tablet Take 50 mg by mouth at bedtime.     VRAYLAR 1.5 MG capsule Take 1.5 mg by mouth daily. Taking 3.0 daily now     No current facility-administered medications on file prior to visit.    ALLERGIES: Allergies  Allergen Reactions   Ceftriaxone Anaphylaxis and Other (See Comments)    ROCEPHIN   Metformin Anaphylaxis    ALL   Penicillins Shortness Of Breath    Has patient had a PCN reaction causing immediate rash, facial/tongue/throat swelling, SOB or lightheadedness with hypotension: Yes Has patient had a PCN reaction causing severe rash involving mucus membranes or skin necrosis: No Has patient had a PCN reaction that required hospitalization No Has patient had a PCN reaction occurring within the last 10 years: No If all of the above answers are "NO", then may proceed with Cephalosporin use.    Shellfish Allergy Anaphylaxis   Flonase [Fluticasone Propionate]     Makes migraines worse   Gold-Containing Drug Products     HANDS ITCH   Metformin Hcl Other (See Comments)   Nickel     HANDS SWELL    Penicillin G Other (See Comments)   Citrus Rash    FAMILY HISTORY: Family History  Problem Relation Age of Onset   Diabetes Mother    Breast cancer Mother    CAD Mother    Hypertension Mother    Alcohol abuse Father    Hypertension Father    Breast cancer Maternal Aunt    Breast cancer Maternal Aunt  Kidney nephrosis Son    Diabetes Son       Objective:  Blood pressure 100/64, pulse 78, height 5\' 10"  (1.778 m), weight (!) 319 lb (144.7 kg), SpO2 96 %. General: No acute distress.  Patient appears well-groomed.   Head:  Normocephalic/atraumatic Eyes:  Fundi examined but not visualized Neck: supple, bilateral paraspinal tenderness, full range of motion Heart:  Regular rate and rhythm Neurological Exam: alert and oriented to person, place, and time.  Speech fluent and not dysarthric, language intact.  CN II-XII intact. Bulk and tone normal, muscle strength 5/5 throughout.  Sensation to light touch intact.  Deep tendon reflexes 2+ throughout.  Finger to nose testing intact.  Gait cautious, antalgic.  Uses walker. Romberg negative.   Shon Millet, DO  CC: Evangeline Dakin, [place]

## 2023-01-02 DIAGNOSIS — R269 Unspecified abnormalities of gait and mobility: Secondary | ICD-10-CM | POA: Diagnosis not present

## 2023-01-03 ENCOUNTER — Ambulatory Visit: Payer: Medicaid Other | Admitting: Neurology

## 2023-01-03 DIAGNOSIS — R269 Unspecified abnormalities of gait and mobility: Secondary | ICD-10-CM | POA: Diagnosis not present

## 2023-01-06 ENCOUNTER — Ambulatory Visit: Payer: Medicaid Other | Admitting: Neurology

## 2023-01-06 ENCOUNTER — Encounter: Payer: Self-pay | Admitting: Neurology

## 2023-01-06 DIAGNOSIS — I671 Cerebral aneurysm, nonruptured: Secondary | ICD-10-CM

## 2023-01-06 DIAGNOSIS — R269 Unspecified abnormalities of gait and mobility: Secondary | ICD-10-CM | POA: Diagnosis not present

## 2023-01-06 DIAGNOSIS — G43009 Migraine without aura, not intractable, without status migrainosus: Secondary | ICD-10-CM

## 2023-01-06 MED ORDER — AIMOVIG 140 MG/ML ~~LOC~~ SOAJ: 140.0000 mg | SUBCUTANEOUS | 11 refills | Status: DC

## 2023-01-06 MED ORDER — TOPIRAMATE 200 MG PO TABS: 200.0000 mg | ORAL_TABLET | Freq: Every day | ORAL | 3 refills | Status: DC

## 2023-01-06 NOTE — Patient Instructions (Addendum)
Aimovig every 4 weeks Topiramate 200mg  at bedtime Limit use of pain relievers to no more than 2 days out of week to prevent risk of rebound or medication-overuse headache. Keep headache diary

## 2023-01-07 DIAGNOSIS — F4325 Adjustment disorder with mixed disturbance of emotions and conduct: Secondary | ICD-10-CM | POA: Diagnosis not present

## 2023-01-07 DIAGNOSIS — R269 Unspecified abnormalities of gait and mobility: Secondary | ICD-10-CM | POA: Diagnosis not present

## 2023-01-08 DIAGNOSIS — R269 Unspecified abnormalities of gait and mobility: Secondary | ICD-10-CM | POA: Diagnosis not present

## 2023-01-09 DIAGNOSIS — R269 Unspecified abnormalities of gait and mobility: Secondary | ICD-10-CM | POA: Diagnosis not present

## 2023-01-10 DIAGNOSIS — R269 Unspecified abnormalities of gait and mobility: Secondary | ICD-10-CM | POA: Diagnosis not present

## 2023-01-10 DIAGNOSIS — G4733 Obstructive sleep apnea (adult) (pediatric): Secondary | ICD-10-CM | POA: Diagnosis not present

## 2023-01-13 ENCOUNTER — Encounter: Payer: Self-pay | Admitting: Obstetrics and Gynecology

## 2023-01-13 ENCOUNTER — Other Ambulatory Visit: Payer: Medicaid Other | Admitting: Obstetrics and Gynecology

## 2023-01-13 DIAGNOSIS — R269 Unspecified abnormalities of gait and mobility: Secondary | ICD-10-CM | POA: Diagnosis not present

## 2023-01-13 NOTE — Patient Outreach (Signed)
Medicaid Managed Care   Nurse Care Manager Note  01/13/2023 Name:  Melinda [name] MRN:  [id number] DOB:  [birth year]  Melinda [name] is an 57 y.o. year old female who is a primary patient of [place], [place], [place].  The [facility] Managed Care Coordination team was consulted for assistance with:    Chronic healthcare management needs, h/o thyroid cancer, HLD, HTN, DM, chronic pain, migraines, anxiety/depression/PTSD, SS, asthma, glaucoma  Ms. Leiter was given information about Medicaid Managed Care Coordination team services today. Melinda [name] Patient agreed to services and verbal consent obtained.  Engaged with patient by telephone for follow up visit in response to provider referral for case management and/or care coordination services.   Assessments/Interventions:  Review of past medical history, allergies, medications, health status, including review of consultants reports, laboratory and other test data, was performed as part of comprehensive evaluation and provision of chronic care management services.  SDOH (Social Determinants of Health) assessments and interventions performed: SDOH Interventions    Flowsheet Row Patient Outreach Telephone from 01/13/2023 in [place] POPULATION HEALTH DEPARTMENT Patient Outreach Telephone from 12/13/2022 in [place] POPULATION HEALTH DEPARTMENT Patient Outreach Telephone from 11/08/2022 in [place] POPULATION HEALTH DEPARTMENT Patient Outreach Telephone from 10/10/2022 in [place] POPULATION HEALTH DEPARTMENT Patient Outreach Telephone from 09/02/2022 in [place] POPULATION HEALTH DEPARTMENT Patient Outreach Telephone from 08/02/2022 in [place] POPULATION HEALTH DEPARTMENT  SDOH Interventions        Food Insecurity Interventions -- -- -- -- -- Intervention Not Indicated  Housing Interventions -- -- Intervention Not Indicated -- -- --  Transportation Interventions -- Intervention Not Indicated -- -- -- --  Utilities Interventions -- -- --  Intervention Not Indicated -- --  Alcohol Usage Interventions -- -- -- -- Intervention Not Indicated (Score <7) --  Depression Interventions/Treatment  -- -- -- -- -- Currently on Treatment  Financial Strain Interventions -- -- Intervention Not Indicated -- -- --  Physical Activity Interventions -- -- -- -- Other (Comments)  [not able to engage in exercise described] --  Stress Interventions Intervention Not Indicated -- -- -- -- --  Health Literacy Interventions Intervention Not Indicated -- -- -- -- --     Care Plan  Allergies  Allergen Reactions   Ceftriaxone Anaphylaxis and Other (See Comments)    ROCEPHIN   Metformin Anaphylaxis    ALL   Penicillins Shortness Of Breath    Has patient had a PCN reaction causing immediate rash, facial/tongue/throat swelling, SOB or lightheadedness with hypotension: Yes Has patient had a PCN reaction causing severe rash involving mucus membranes or skin necrosis: No Has patient had a PCN reaction that required hospitalization No Has patient had a PCN reaction occurring within the last 10 years: No If all of the above answers are "NO", then may proceed with Cephalosporin use.    Shellfish Allergy Anaphylaxis   Flonase [Fluticasone Propionate]     Makes migraines worse   Gold-Containing Drug Products     HANDS ITCH   Metformin Hcl Other (See Comments)   Nickel     HANDS SWELL   Penicillin G Other (See Comments)   Citrus Rash   Medications Reviewed Today     Reviewed by [name], RN (Registered Nurse) on 01/13/23 at 1455  Med List Status: <None>   Medication Order Taking? Sig Documenting Provider Last Dose Status Informant  acetaminophen (TYLENOL) 500 MG tablet [id number] No Take 1 tablet (500 mg total) by mouth every 6 (six)  hours as needed. Kerrin Champagne, MD Taking Active   albuterol (PROVENTIL) (2.5 MG/3ML) 0.083% nebulizer solution [id number] No Take 3 mLs (2.5 mg total) by nebulization every 6 (six) hours as needed for wheezing or  shortness of breath. [name], MD Taking Active Self  albuterol (VENTOLIN HFA) 108 (90 Base) MCG/ACT inhaler [id number] No Inhale 2 puffs into the lungs every 6 (six) hours as needed for wheezing or shortness of breath. [name], MD Taking Active Self  apixaban (ELIQUIS) 5 MG TABS tablet [id number] No TAKE ONE TABLET BY MOUTH TWICE DAILY MORNING AND [name], MD Taking Active   atorvastatin (LIPITOR) 20 MG tablet [id number] No TAKE ONE TABLET BY MOUTH ONCE DAILY AT BEDTIME [name], MD Taking Active   budesonide-formoterol (SYMBICORT) 160-4.5 MCG/ACT inhaler [id number] No Inhale 2 puffs into the lungs 2 (two) times daily. [name], MD Taking Active   buPROPion (WELLBUTRIN XL) 300 MG 24 hr tablet [id number] No Take 300 mg by mouth daily.  [provider] Taking Active Self  busPIRone (BUSPAR) 10 MG tablet [id number] No Take 10 mg by mouth 2 (two) times daily. [provider] Taking Active Self           Med Note [name], [facility]   Wed Feb 18, 2018 10:03 PM)    Cholecalciferol (VITAMIN D3) 10 MCG (400 UNIT) CAPS [id number] No TAKE ONE CAPSULE BY MOUTH ONCE DAILY (AM) [name], MD Taking Active   cyclobenzaprine (FLEXERIL) 10 MG tablet [id number] No TAKE 1 TABLET (10 MG TOTAL) BY MOUTH 3 (THREE) TIMES DAILY AS NEEDED FOR MUSCLE SPASMS. [name], MD Taking Active   doxepin (SINEQUAN) 50 MG capsule [id number] No Take 150 mg by mouth at bedtime. [provider] Taking Active Self  Erenumab-aooe (AIMOVIG) 140 MG/ML SOAJ [id number]  Inject 140 mg into the skin every 28 (twenty-eight) days. [name], DO  Active   gabapentin (NEURONTIN) 300 MG capsule [id number] No TAKE ONE CAPSULE BY MOUTH THREE TIMES DAILY (AM+NOON+BEDTIME) [name], MD Taking Active   loratadine (CLARITIN) 10 MG tablet [id number] No TAKE 1 TABLET (10 MG TOTAL) BY MOUTH DAILY (AM) [name], MD Taking Active   metoprolol tartrate  (LOPRESSOR) 50 MG tablet [id number] No TAKE ONE TABLET BY MOUTH TWICE DAILY (AM+BEDTIME) [name], MD Taking Active   montelukast (SINGULAIR) 10 MG tablet [id number] No TAKE 1 TABLET BY MOUTH EVERY EVENING AT BEDTIME [name], MD Taking Active   nystatin-triamcinolone ointment [facility]) [id number] No Apply 1 Application topically 2 (two) times daily. Apply topically two times daily for five days Ritu Gagliardo Piedra, DO Taking Active   OZEMPIC, 1 MG/DOSE, 4 MG/3ML SOPN [id number] No Inject 2 mg into the skin once a week. [provider] Taking Active Self  prazosin (MINIPRESS) 2 MG capsule [id number] No Take 6 mg by mouth at bedtime. [provider] Taking Active Self  SYNTHROID 200 MCG tablet [id number] No Take 1 tablet (200 mcg total) by mouth daily before breakfast.  Patient taking differently: Take 224 mcg by mouth daily before breakfast. Takes 2 tablets in the morning   [name], MD Taking Active Self  topiramate (TOPAMAX) 200 MG tablet [id number]  Take 1 tablet (200 mg total) by mouth at bedtime. [name], DO  Active   traZODone (DESYREL) 50 MG tablet [id number] No Take 50 mg by mouth at bedtime. [provider] Taking Active Self  VRAYLAR 1.5 MG capsule [id number] No Take  1.5 mg by mouth daily. Taking 3.0 daily now [provider] Taking Active Self           Patient Active Problem List   Diagnosis Date Noted   Postoperative hypothyroidism 01/21/2022   Esophageal dysphagia 01/21/2022   History of DVT (deep vein thrombosis) 01/21/2022   History of malignant neoplasm of thyroid 01/21/2022   Long term (current) use of anticoagulants 01/21/2022   Migraine without aura, not refractory 01/21/2022   Type 2 diabetes mellitus with hyperglycemia (HCC) 01/21/2022   Spinal stenosis excluding cervical region 11/21/2021   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   Abdominal pannus 08/21/2021   Hardening of the aorta (main artery of the  heart) (HCC) 08/21/2021   At high risk for falls 04/20/2020   Hyperlipidemia 02/16/2020   Adrenal mass, right (HCC) 04/29/2019   Chronic cough 03/12/2018   Left shoulder pain 10/14/2016   Primary hypertension 10/14/2016   S/P thyroidectomy 06/27/2016   Chronic bilateral low back pain without sciatica 04/22/2016   Liver lesion 12/24/2015   Malignant tumor of thyroid gland (HCC) 11/16/2015   Controlled type 2 diabetes mellitus with complication, with long-term current use of insulin (HCC) 10/10/2015   Vision loss 08/25/2015   Decreased vision in both eyes 08/25/2015   Prolonged Q-T interval on ECG 08/19/2015   History of pulmonary embolism 08/10/2015   Mild persistent asthma 04/13/2015   Nonruptured cerebral aneurysm 04/13/2015   Fibroid, uterine    Morbid obesity (HCC) 11/11/2014   Atypical chest pain 11/08/2014   Sinus tachycardia 11/08/2014   Migraine 10/14/2014   Menorrhagia 09/08/2014   Conditions to be addressed/monitored per PCP order:  Chronic healthcare management needs, h/o thyroid cancer, HLD, HTN, DM, chronic pain, migraines, anxiety/depression/PTSD, SS, asthma, glaucoma  Care Plan : General Plan of Care (Adult)  Updates made by [name], RN since 01/13/2023 12:00 AM     Problem: Health Promotion or Disease Self-Management (General Plan of Care)   Priority: Medium  Onset Date: 10/20/2020     Long-Range Goal: Chronic Disease Management   Start Date: 07/14/2020  Expected End Date: 04/15/2023  Recent Progress: On track  Priority: Medium  Note:    Current Barriers:  Chronic Disease Management support and education needs related to DM, HTN, chronic pain, migraines, asthma, HLD, anxiety, depression Patient with anxiety and depression-has appointment with Psychiatrist  scheduled every 3 months, therapist every 2 weeks 01/13/23:  No complaints today-needs to find new therapist as current one will stop taking her insurance 8/1-scheduled an appt with LCSW for  resources..  Started using CPAP 7/13.  No swelling-has upcoming eye and ENDO appts.   Nurse Case Manager Clinical Goal(s):  Over the next 90 days, patient will attend all scheduled medical appointments:  Over the next 30 days, patient will work with CM team pharmacist to review medications  Interventions:  Inter-disciplinary care team collaboration (see longitudinal plan of care) Evaluation of current treatment plan  and patient's adherence to plan as established by provider. Reviewed medications with patient. Collaborated with pharmacy regarding medications. Discussed plans with patient for ongoing care management follow up and provided patient with direct contact information for care management team Reviewed scheduled/upcoming provider appointments. Collaborated with SW for psychotherapy referral. SW referral for psychotherapy provider-completed Pharmacy referral for medication review-completed Collaborated with PCP for new BP cuff-completed   Asthma: (Status:New goal.) Long Term Goal Advised patient to track and manage Asthma triggers Provided education about and advised patient to utilize infection prevention strategies  to reduce risk of respiratory infection Discussed the importance of adequate rest and management of fatigue with Asthma Screening for signs and symptoms of depression related to chronic disease state  Assessed social determinant of health barriers   Diabetes Interventions:  (Status:  New goal.) Long Term Goal Assessed patient's understanding of A1c goal: <7% Reviewed medications with patient and discussed importance of medication adherence Discussed plans with patient for ongoing care management follow up and provided patient with direct contact information for care management team Reviewed scheduled/upcoming provider appointments  Advised patient, providing education and rationale, to check cbg as directed  and record, calling provider  for findings outside  established parameters Review of patient status, including review of consultants reports, relevant laboratory and other test results, and medications completed Screening for signs and symptoms of depression related to chronic disease state  Assessed social determinant of health barriers Lab Results  Component Value Date   HGBA1C 6.1 04/20/2021       HGBA1C                                                      6.2                                    09/26/22  Hyperlipidemia Interventions:  (Status:  New goal.) Long Term Goal Medication review performed; medication list updated in electronic medical record.  Provider established cholesterol goals reviewed Counseled on importance of regular laboratory monitoring as prescribed Reviewed importance of limiting foods high in cholesterol Screening for signs and symptoms of depression related to chronic disease state Assessed social determinant of health barriers   Hypertension Interventions:  (Status:  New goal.) Long Term Goal Last practice recorded BP readings:  BP Readings from Last 3 Encounters:  02/01/22 111/77  01/24/22 128/83  01/21/22 104/73  10/09/22         115/69 11/08/22:        140/68 01/06/23         100/64  Most recent eGFR/CrCl:  Lab Results  Component Value Date   EGFR 68 12/19/2020    No components found for: "CRCL"  Evaluation of current treatment plan related to hypertension self management and patient's adherence to plan as established by provider Reviewed medications with patient and discussed importance of compliance Discussed plans with patient for ongoing care management follow up and provided patient with direct contact information for care management team Advised patient, providing education and rationale, to monitor blood pressure daily and record, calling PCP for findings outside established parameters Reviewed scheduled/upcoming provider appointments including:  Screening for signs and symptoms of depression  related to chronic disease state  Assessed social determinant of health barriers  Pain Interventions:  (Status:  New goal.) Long Term Goal Pain assessment performed Medications reviewed Reviewed provider established plan for pain management Discussed importance of adherence to all scheduled medical appointments Counseled on the importance of reporting any/all new or changed pain symptoms or management strategies to pain management provider Advised patient to report to care team affect of pain on daily activities Reviewed with patient prescribed pharmacological and nonpharmacological pain relief strategies Screening for signs and symptoms of depression related to chronic disease state  Assessed social determinant of health barriers  Self Care Activities: Over the next 90 days, patient will:  -Self administers medications as prescribed Attends all scheduled provider appointments Calls pharmacy for medication refills Calls provider office for new concerns or questions Patient will locate meter to check blood sugars-completed Schedule eye provider appt.-completed Follow up with provider regarding blood sugars-completed  Follow Up Plan: The Managed Medicaid care management team will reach out to the patient again over the next 30  business days.  The patient has been provided with contact information for the Managed Medicaid care management team and has been advised to call with any health related questions or concerns.    Follow Up:  Patient agrees to Care Plan and Follow-up.  Plan: The Managed Medicaid care management team will reach out to the patient again over the next 30 business  days. and The  Patient has been provided with contact information for the Managed Medicaid care management team and has been advised to call with any health related questions or concerns.  Date/time of next scheduled RN care management/care coordination outreach: 02/14/23 at 230

## 2023-01-13 NOTE — Patient Instructions (Signed)
Hi Melinda [name], glad you are doing well today-have a nice afternoon!  Melinda [name] was given information about Medicaid Managed Care team care coordination services as a part of their [facility] Community Plan Medicaid benefit. Melinda [name] verbally consented to engagement with the [facility] Managed Care team.   If you are experiencing a medical emergency, please call 911 or report to your local emergency department or urgent care.   If you have a non-emergency medical problem during routine business hours, please contact your provider's office and ask to speak with a nurse.   For questions related to your [facility], please call: [phone] or visit the homepage here: [email or web address]  If you would like to schedule transportation through your [facility] please call the following number at least 2 days in advance of your appointment: [phone]   Rides for urgent appointments can also be made after hours by calling Member Services.  Call the Behavioral Health Crisis Line at [phone], at any time, 24 hours a day, 7 days a week. If you are in danger or need immediate medical attention call 911.  If you would like help to quit smoking, call 1-800-QUIT-NOW (832-848-6468) OR Espaol: 1-855-Djelo-Ya (2-706-237-6283) o para ms informacin haga clic aqu or Text READY to 151-761 to register via text  Melinda [name] - following are the goals we discussed in your visit today:   Goals Addressed             This Visit's Progress    Protect My Health        Timeframe:  Long-Range Goal Priority:  Medium Start Date:    07/14/20                         Expected End Date:  ongoing          Follow Up Date: 02/14/23 - schedule appointment for vaccines needed due to my age or health - schedule recommended health tests (blood work, mammogram, colonoscopy, pap test) - schedule and keep  appointment for annual check-up  01/13/23:  Up to date on appts-recent PCP, Podiatry, and NEURO appt, has upcoming eye and ENDO appt   Patient verbalizes understanding of instructions and care plan provided today and agrees to view in MyChart. Active MyChart status and patient understanding of how to access instructions and care plan via MyChart confirmed with patient.     The Managed Medicaid care management team will reach out to the patient again over the next 30 business  days.  The  Patient has been provided with contact information for the Managed Medicaid care management team and has been advised to call with any health related questions or concerns.   Melinda Der RN, BSN [place]  [facility] Care Management Coordinator - Managed Medicaid High Risk [phone]   Following is a copy of your plan of care:  Care Plan : General Plan of Care (Adult)  Updates made by [name], RN since 01/13/2023 12:00 AM     Problem: Health Promotion or Disease Self-Management (General Plan of Care)   Priority: Medium  Onset Date: 10/20/2020     Long-Range Goal: Chronic Disease Management   Start Date: 07/14/2020  Expected End Date: 04/15/2023  Recent Progress: On track  Priority: Medium  Note:    Current Barriers:  Chronic Disease Management support and education needs related to DM, HTN, chronic pain, migraines, asthma, HLD, anxiety, depression  Patient with anxiety and depression-has appointment with Psychiatrist  scheduled every 3 months, therapist every 2 weeks 01/13/23:  No complaints today-needs to find new therapist as current one will stop taking her insurance 8/1-scheduled an appt with LCSW for resources..  Started using CPAP 7/13.  No swelling-has upcoming eye and ENDO appts.   Nurse Case Manager Clinical Goal(s):  Over the next 90 days, patient will attend all scheduled medical appointments:  Over the next 30 days, patient will work with CM team pharmacist to  review medications  Interventions:  Inter-disciplinary care team collaboration (see longitudinal plan of care) Evaluation of current treatment plan  and patient's adherence to plan as established by provider. Reviewed medications with patient. Collaborated with pharmacy regarding medications. Discussed plans with patient for ongoing care management follow up and provided patient with direct contact information for care management team Reviewed scheduled/upcoming provider appointments. Collaborated with SW for psychotherapy referral. SW referral for psychotherapy provider-completed Pharmacy referral for medication review-completed Collaborated with PCP for new BP cuff-completed   Asthma: (Status:New goal.) Long Term Goal Advised patient to track and manage Asthma triggers Provided education about and advised patient to utilize infection prevention strategies to reduce risk of respiratory infection Discussed the importance of adequate rest and management of fatigue with Asthma Screening for signs and symptoms of depression related to chronic disease state  Assessed social determinant of health barriers   Diabetes Interventions:  (Status:  New goal.) Long Term Goal Assessed patient's understanding of A1c goal: <7% Reviewed medications with patient and discussed importance of medication adherence Discussed plans with patient for ongoing care management follow up and provided patient with direct contact information for care management team Reviewed scheduled/upcoming provider appointments  Advised patient, providing education and rationale, to check cbg as directed  and record, calling provider  for findings outside established parameters Review of patient status, including review of consultants reports, relevant laboratory and other test results, and medications completed Screening for signs and symptoms of depression related to chronic disease state  Assessed social determinant of health  barriers Lab Results  Component Value Date   HGBA1C 6.1 04/20/2021       HGBA1C                                                      6.2                                    09/26/22  Hyperlipidemia Interventions:  (Status:  New goal.) Long Term Goal Medication review performed; medication list updated in electronic medical record.  Provider established cholesterol goals reviewed Counseled on importance of regular laboratory monitoring as prescribed Reviewed importance of limiting foods high in cholesterol Screening for signs and symptoms of depression related to chronic disease state Assessed social determinant of health barriers   Hypertension Interventions:  (Status:  New goal.) Long Term Goal Last practice recorded BP readings:  BP Readings from Last 3 Encounters:  02/01/22 111/77  01/24/22 128/83  01/21/22 104/73  10/09/22         115/69 11/08/22:        140/68 01/06/23         100/64  Most recent eGFR/CrCl:  Lab Results  Component Value Date  EGFR 68 12/19/2020    No components found for: "CRCL"  Evaluation of current treatment plan related to hypertension self management and patient's adherence to plan as established by provider Reviewed medications with patient and discussed importance of compliance Discussed plans with patient for ongoing care management follow up and provided patient with direct contact information for care management team Advised patient, providing education and rationale, to monitor blood pressure daily and record, calling PCP for findings outside established parameters Reviewed scheduled/upcoming provider appointments including:  Screening for signs and symptoms of depression related to chronic disease state  Assessed social determinant of health barriers  Pain Interventions:  (Status:  New goal.) Long Term Goal Pain assessment performed Medications reviewed Reviewed provider established plan for pain management Discussed importance of adherence  to all scheduled medical appointments Counseled on the importance of reporting any/all new or changed pain symptoms or management strategies to pain management provider Advised patient to report to care team affect of pain on daily activities Reviewed with patient prescribed pharmacological and nonpharmacological pain relief strategies Screening for signs and symptoms of depression related to chronic disease state  Assessed social determinant of health barriers   Self Care Activities: Over the next 90 days, patient will:  -Self administers medications as prescribed Attends all scheduled provider appointments Calls pharmacy for medication refills Calls provider office for new concerns or questions Patient will locate meter to check blood sugars-completed Schedule eye provider appt.-completed Follow up with provider regarding blood sugars-completed  Follow Up Plan: The Managed Medicaid care management team will reach out to the patient again over the next 30  business days.  The patient has been provided with contact information for the Managed Medicaid care management team and has been advised to call with any health related questions or concerns.

## 2023-01-14 DIAGNOSIS — R269 Unspecified abnormalities of gait and mobility: Secondary | ICD-10-CM | POA: Diagnosis not present

## 2023-01-15 DIAGNOSIS — R269 Unspecified abnormalities of gait and mobility: Secondary | ICD-10-CM | POA: Diagnosis not present

## 2023-01-15 DIAGNOSIS — F4325 Adjustment disorder with mixed disturbance of emotions and conduct: Secondary | ICD-10-CM | POA: Diagnosis not present

## 2023-01-16 DIAGNOSIS — R269 Unspecified abnormalities of gait and mobility: Secondary | ICD-10-CM | POA: Diagnosis not present

## 2023-01-17 DIAGNOSIS — R269 Unspecified abnormalities of gait and mobility: Secondary | ICD-10-CM | POA: Diagnosis not present

## 2023-01-20 DIAGNOSIS — H0102B Squamous blepharitis left eye, upper and lower eyelids: Secondary | ICD-10-CM | POA: Diagnosis not present

## 2023-01-20 DIAGNOSIS — H0102A Squamous blepharitis right eye, upper and lower eyelids: Secondary | ICD-10-CM | POA: Diagnosis not present

## 2023-01-20 DIAGNOSIS — H2513 Age-related nuclear cataract, bilateral: Secondary | ICD-10-CM | POA: Diagnosis not present

## 2023-01-20 DIAGNOSIS — H31092 Other chorioretinal scars, left eye: Secondary | ICD-10-CM | POA: Diagnosis not present

## 2023-01-20 DIAGNOSIS — E119 Type 2 diabetes mellitus without complications: Secondary | ICD-10-CM | POA: Diagnosis not present

## 2023-01-20 DIAGNOSIS — H04123 Dry eye syndrome of bilateral lacrimal glands: Secondary | ICD-10-CM | POA: Diagnosis not present

## 2023-01-20 DIAGNOSIS — R269 Unspecified abnormalities of gait and mobility: Secondary | ICD-10-CM | POA: Diagnosis not present

## 2023-01-20 DIAGNOSIS — H40013 Open angle with borderline findings, low risk, bilateral: Secondary | ICD-10-CM | POA: Diagnosis not present

## 2023-01-20 DIAGNOSIS — H05243 Constant exophthalmos, bilateral: Secondary | ICD-10-CM | POA: Diagnosis not present

## 2023-01-21 ENCOUNTER — Other Ambulatory Visit: Payer: Medicaid Other | Admitting: Licensed Clinical Social Worker

## 2023-01-21 DIAGNOSIS — R269 Unspecified abnormalities of gait and mobility: Secondary | ICD-10-CM | POA: Diagnosis not present

## 2023-01-21 NOTE — Patient Instructions (Signed)
Visit Information  Melinda [name] was given information about Medicaid Managed Care team care coordination services as a part of their [facility] Community Plan Medicaid benefit. Melinda [name] verbally consented to engagement with the [facility] Managed Care team.   If you are experiencing a medical emergency, please call 911 or report to your local emergency department or urgent care.   If you have a non-emergency medical problem during routine business hours, please contact your provider's office and ask to speak with a nurse.   For questions related to your [facility], please call: [phone] or visit the homepage here: [email or web address]  If you would like to schedule transportation through your [facility], please call the following number at least 2 days in advance of your appointment: [phone]   Rides for urgent appointments can also be made after hours by calling Member Services.  Call the Behavioral Health Crisis Line at [phone], at any time, 24 hours a day, 7 days a week. If you are in danger or need immediate medical attention call 911.  If you would like help to quit smoking, call 1-800-QUIT-NOW (365-321-3433) OR Espaol: 1-855-Djelo-Ya (5-956-387-5643) o para ms informacin haga clic aqu or Text READY to 329-518 to register via text  Following is a copy of your plan of care:  Care Plan : LCSW plan of care  Updates made by Melinda Bryant, LCSW since 01/21/2023 12:00 AM     Problem: Therapy      Goal: Therapy Needs Identified   Start Date: 01/21/2023  Note:   Priority: High  Timeframe:  Short-Range Goal Priority:  High Start Date:   01/21/23          Expected End Date:  ongoing                     Follow Up Date--01/31/23 at 230 pm.  - keep 90 percent of scheduled appointments- counseling and psychiatry -consider bumping up your self-care  -consider  creating a stronger support network    Current barriers:   Mental Health needs related to depression/stress management. Patient requires Support, Education, Resources, Referrals, Advocacy, and Care Coordination, in order to meet Unmet Mental Health Needs. Will lose current therapy at [facility] as of 01/30/23 Patient will implement clinical interventions discussed today to decrease symptoms of depression and increase knowledge and/or ability of: coping skills. Patient lacks knowledge of available community counseling agencies and resources.  Clinical Goal(s): verbalize understanding of plan for management of Stress and demonstrate a reduction in symptoms. Patient will connect with a provider for ongoing mental health treatment, increase coping skills, healthy habits, self-management skills, and stress reduction        Patient Goals/Self-Care Activities: Over the next 120 days Attend scheduled medical appointments Utilize healthy coping skills and supportive resources discussed Contact PCP with any questions or concerns Keep 90 percent of counseling appointments Call your insurance provider for more information about your Enhanced Benefits  Check out counseling resources provided  Begin personal counseling with LCSW, to reduce and manage symptoms of Stress, until well-established with a new mental health therapist.  Incorporate into daily practice - relaxation techniques, deep breathing exercises, and mindfulness meditation strategies. Talk about feelings with friends, family members, spiritual advisor, etc. Contact LCSW directly [phone]), if you have questions, need assistance, or if additional social work needs are identified between now and our next scheduled telephone outreach call. Call 988 for mental health hotline/crisis line if needed (  24/7 available) Try techniques to reduce symptoms of anxiety/negative thinking (deep breathing, distraction, positive self talk, etc)  - develop a  personal safety plan - develop a plan to deal with triggers like holidays, anniversaries - exercise at least 2 to 3 times per week - have a plan for how to handle bad days - journal feelings and what helps to feel better or worse - spend time or talk with others at least 2 to 3 times per week - watch for early signs of feeling worse - begin personal counseling - call and visit an old friend - check out volunteer opportunities - join a support group - laugh; watch a funny movie or comedian - learn and use visualization or guided imagery - perform a random act of kindness - practice relaxation or meditation daily - start or continue a personal journal - practice positive thinking and self-talk -continue with compliance of taking medication  -identify current effective and ineffective coping strategies.  -implement positive self-talk in care to increase self-esteem, confidence and feelings of control.  -consider alternative and complementary therapy approaches such as meditation, mindfulness or yoga.  Call your insurance provider to gain education on benefits if desired Call your primary care doctor if symptoms get worse  -journaling, prayer, worship services, meditation or pastoral counseling.  -increase participation in pleasurable group activities such as hobbies, singing, sports or volunteering).  -consider the use of meditative movement therapy such as tai chi, yoga or qigong.  -start a regular daily exercise program based on tolerance, ability and patient choice to support positive thinking and activity    If you are experiencing a Mental Health or Behavioral Health Crisis or need someone to talk to, please call the Suicide and Crisis Lifeline: 988    Patient Goals: Initial goal  [name], BSW, MSW, [name] Managed Medicaid LCSW [facility]  [facility] [email or web address] Phone: [phone]

## 2023-01-21 NOTE — Patient Outreach (Signed)
Medicaid Managed Care Social Work Note  01/21/2023 Name:  Melinda [name] MRN:  [id number] DOB:  01-23-1966  Melinda [name] is an 57 y.o. year old female who is a primary patient of [place], [place], [place].  The Medicaid Managed Care Coordination team was consulted for assistance with:  Mental Health Counseling and Resources  [name] was given information about Medicaid Managed Care Coordination team services today. Melinda [name] Patient agreed to services and verbal consent obtained.  Engaged with patient  for by telephone forfollow up visit in response to referral for case management and/or care coordination services.   Assessments/Interventions:  Review of past medical history, allergies, medications, health status, including review of consultants reports, laboratory and other test data, was performed as part of comprehensive evaluation and provision of chronic care management services.  SDOH: (Social Determinant of Health) assessments and interventions performed: SDOH Interventions    Flowsheet Row Patient Outreach Telephone from 01/21/2023 in [place] POPULATION HEALTH DEPARTMENT Patient Outreach Telephone from 01/13/2023 in [place] POPULATION HEALTH DEPARTMENT Patient Outreach Telephone from 12/13/2022 in [place] POPULATION HEALTH DEPARTMENT Patient Outreach Telephone from 11/08/2022 in [place] POPULATION HEALTH DEPARTMENT Patient Outreach Telephone from 10/10/2022 in [place] POPULATION HEALTH DEPARTMENT Patient Outreach Telephone from 09/02/2022 in  POPULATION HEALTH DEPARTMENT  SDOH Interventions        Housing Interventions -- -- -- Intervention Not Indicated -- --  Transportation Interventions -- -- Intervention Not Indicated -- -- --  Utilities Interventions -- -- -- -- Intervention Not Indicated --  Alcohol Usage Interventions -- -- -- -- -- Intervention Not Indicated (Score <7)  Financial Strain Interventions -- -- -- Intervention Not Indicated -- --  Physical  Activity Interventions -- -- -- -- -- Other (Comments)  [not able to engage in exercise described]  Stress Interventions Provide Counseling, [employer] Intervention Not Indicated -- -- -- --  Health Literacy Interventions -- Intervention Not Indicated -- -- -- --       Advanced Directives Status:  See Care Plan for related entries.  Care Plan                 Allergies  Allergen Reactions   Ceftriaxone Anaphylaxis and Other (See Comments)    ROCEPHIN   Metformin Anaphylaxis    ALL   Penicillins Shortness Of Breath    Has patient had a PCN reaction causing immediate rash, facial/tongue/throat swelling, SOB or lightheadedness with hypotension: Yes Has patient had a PCN reaction causing severe rash involving mucus membranes or skin necrosis: No Has patient had a PCN reaction that required hospitalization No Has patient had a PCN reaction occurring within the last 10 years: No If all of the above answers are "NO", then may proceed with Cephalosporin use.    Shellfish Allergy Anaphylaxis   Flonase [Fluticasone Propionate]     Makes migraines worse   Gold-Containing Drug Products     HANDS ITCH   Metformin Hcl Other (See Comments)   Nickel     HANDS SWELL   Penicillin G Other (See Comments)   Citrus Rash    Medications Reviewed Today     Reviewed by [name], RN (Registered Nurse) on 01/13/23 at 1455  Med List Status: <None>   Medication Order Taking? Sig Documenting Provider Last Dose Status Informant  acetaminophen (TYLENOL) 500 MG tablet [id number] No Take 1 tablet (500 mg total) by mouth every 6 (six) hours as needed. Kerrin Champagne, MD  Taking Active   albuterol (PROVENTIL) (2.5 MG/3ML) 0.083% nebulizer solution [id number] No Take 3 mLs (2.5 mg total) by nebulization every 6 (six) hours as needed for wheezing or shortness of breath. [name], MD Taking Active Self  albuterol (VENTOLIN HFA) 108 (90 Base) MCG/ACT inhaler [id number] No  Inhale 2 puffs into the lungs every 6 (six) hours as needed for wheezing or shortness of breath. [name], MD Taking Active Self  apixaban (ELIQUIS) 5 MG TABS tablet [id number] No TAKE ONE TABLET BY MOUTH TWICE DAILY MORNING AND [name], MD Taking Active   atorvastatin (LIPITOR) 20 MG tablet [id number] No TAKE ONE TABLET BY MOUTH ONCE DAILY AT BEDTIME [name], MD Taking Active   budesonide-formoterol (SYMBICORT) 160-4.5 MCG/ACT inhaler [id number] No Inhale 2 puffs into the lungs 2 (two) times daily. [name], MD Taking Active   buPROPion (WELLBUTRIN XL) 300 MG 24 hr tablet [id number] No Take 300 mg by mouth daily.  [provider] Taking Active Self  busPIRone (BUSPAR) 10 MG tablet [id number] No Take 10 mg by mouth 2 (two) times daily. [provider] Taking Active Self           Med Note [name], [facility]   Wed Feb 18, 2018 10:03 PM)    Cholecalciferol (VITAMIN D3) 10 MCG (400 UNIT) CAPS [id number] No TAKE ONE CAPSULE BY MOUTH ONCE DAILY (AM) [name], MD Taking Active   cyclobenzaprine (FLEXERIL) 10 MG tablet [id number] No TAKE 1 TABLET (10 MG TOTAL) BY MOUTH 3 (THREE) TIMES DAILY AS NEEDED FOR MUSCLE SPASMS. [name], MD Taking Active   doxepin (SINEQUAN) 50 MG capsule [id number] No Take 150 mg by mouth at bedtime. [provider] Taking Active Self  Erenumab-aooe (AIMOVIG) 140 MG/ML SOAJ [id number]  Inject 140 mg into the skin every 28 (twenty-eight) days. [name], DO  Active   gabapentin (NEURONTIN) 300 MG capsule [id number] No TAKE ONE CAPSULE BY MOUTH THREE TIMES DAILY (AM+NOON+BEDTIME) [name], MD Taking Active   loratadine (CLARITIN) 10 MG tablet [id number] No TAKE 1 TABLET (10 MG TOTAL) BY MOUTH DAILY (AM) [name], MD Taking Active   metoprolol tartrate (LOPRESSOR) 50 MG tablet [id number] No TAKE ONE TABLET BY MOUTH TWICE DAILY (AM+BEDTIME) [name], MD Taking Active    montelukast (SINGULAIR) 10 MG tablet [id number] No TAKE 1 TABLET BY MOUTH EVERY EVENING AT BEDTIME [name], MD Taking Active   nystatin-triamcinolone ointment [facility]) [id number] No Apply 1 Application topically 2 (two) times daily. Apply topically two times daily for five days Terri Piedra, DO Taking Active   OZEMPIC, 1 MG/DOSE, 4 MG/3ML SOPN [id number] No Inject 2 mg into the skin once a week. [provider] Taking Active Self  prazosin (MINIPRESS) 2 MG capsule [id number] No Take 6 mg by mouth at bedtime. [provider] Taking Active Self  SYNTHROID 200 MCG tablet [id number] No Take 1 tablet (200 mcg total) by mouth daily before breakfast.  Patient taking differently: Take 224 mcg by mouth daily before breakfast. Takes 2 tablets in the morning   [name], MD Taking Active Self  topiramate (TOPAMAX) 200 MG tablet [id number]  Take 1 tablet (200 mg total) by mouth at bedtime. [name], DO  Active   traZODone (DESYREL) 50 MG tablet [id number] No Take 50 mg by mouth at bedtime. [provider] Taking Active Self  VRAYLAR 1.5 MG capsule [id number] No Take 1.5 mg by mouth daily. Taking 3.0  daily now [provider] Taking Active Self            Patient Active Problem List   Diagnosis Date Noted   Postoperative hypothyroidism 01/21/2022   Esophageal dysphagia 01/21/2022   History of DVT (deep vein thrombosis) 01/21/2022   History of malignant neoplasm of thyroid 01/21/2022   Long term (current) use of anticoagulants 01/21/2022   Migraine without aura, not refractory 01/21/2022   Type 2 diabetes mellitus with hyperglycemia (HCC) 01/21/2022   Spinal stenosis excluding cervical region 11/21/2021   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   Abdominal pannus 08/21/2021   Hardening of the aorta (main artery of the heart) (HCC) 08/21/2021   At high risk for falls 04/20/2020   Hyperlipidemia 02/16/2020   Adrenal mass, right (HCC)  04/29/2019   Chronic cough 03/12/2018   Left shoulder pain 10/14/2016   Primary hypertension 10/14/2016   S/P thyroidectomy 06/27/2016   Chronic bilateral low back pain without sciatica 04/22/2016   Liver lesion 12/24/2015   Malignant tumor of thyroid gland (HCC) 11/16/2015   Controlled type 2 diabetes mellitus with complication, with long-term current use of insulin (HCC) 10/10/2015   Vision loss 08/25/2015   Decreased vision in both eyes 08/25/2015   Prolonged Q-T interval on ECG 08/19/2015   History of pulmonary embolism 08/10/2015   Mild persistent asthma 04/13/2015   Nonruptured cerebral aneurysm 04/13/2015   Fibroid, uterine    Morbid obesity (HCC) 11/11/2014   Atypical chest pain 11/08/2014   Sinus tachycardia 11/08/2014   Migraine 10/14/2014   Menorrhagia 09/08/2014    Conditions to be addressed/monitored per PCP order:   Stress Management  Care Plan : LCSW plan of care  Updates made by [name], LCSW since 01/21/2023 12:00 AM     Problem: Therapy      Goal: Therapy Needs Identified   Start Date: 01/21/2023  Note:   Priority: High  Timeframe:  Short-Range Goal Priority:  High Start Date:   01/21/23          Expected End Date:  ongoing                     Follow Up Date--01/31/23 at 230 pm.  - keep 90 percent of scheduled appointments- counseling and psychiatry -consider bumping up your self-care  -consider creating a stronger support network    Current barriers:   Mental Health needs related to depression/stress management. Patient requires Support, Education, Resources, Referrals, Advocacy, and Care Coordination, in order to meet Unmet Mental Health Needs. Will lose current therapy at [facility] as of 01/30/23 Patient will implement clinical interventions discussed today to decrease symptoms of depression and increase knowledge and/or ability of: coping skills. Patient lacks knowledge of available community counseling agencies and resources.  Clinical  Goal(s): verbalize understanding of plan for management of Stress and demonstrate a reduction in symptoms. Patient will connect with a provider for ongoing mental health treatment, increase coping skills, healthy habits, self-management skills, and stress reduction        Clinical Interventions:  Assessed patient's previous and current treatment, coping skills, support system and barriers to care. Patient provided hx  Verbalization of feelings encouraged, motivational interviewing employed Emotional support provided, positive coping strategies explored. As of 01/30/23 will no longer be able to keep seeing therapist at [facility] as they will not take [facility] Managed Medicaid anymore. Patient has a psychiatrist at [facility] and was advised to contact agency to question if she  can start therapy there. Joint phone call made to [facility] and voice message left inquiring about warm transfer between therapist.  Establishing healthy boundaries emphasized and healthy self-care education provided Patient was educated on available mental health resources within their area that accept Medicaid and offer counseling. Email sent to patient today with available mental health resources within her area that accept Medicaid and offer the services that she is interested in. Email included some crisis support resources and [facility]'s walk in clinic hours.  Patient will work on implementing appropriate self-care habits into their daily routine such as: staying positive, writing a gratitude list, drinking water, staying active around the house, taking their medications as prescribed, combating negative thoughts or emotions and staying connected with their family and friends. Positive reinforcement provided for this decision to work on this. Motivational Interviewing employed Depression screen reviewed  PHQ2/ PHQ9 completed or reviewed  Mindfulness or Relaxation training provided Active listening /  Reflection utilized  Advance Care and HCPOA education provided Emotional Support Provided Problem Solving /Task Center strategies reviewed Provided psychoeducation for mental health needs  Provided brief CBT  Reviewed mental health medications and discussed importance of compliance:  Quality of sleep assessed & Sleep Hygiene techniques promoted  Participation in counseling encouraged  Verbalization of feelings encouraged  Suicidal Ideation/Homicidal Ideation assessed: Patient denies SI/HI  Review resources, discussed options and provided patient information about  Mental Health Resources Inter-disciplinary care team collaboration (see longitudinal plan of care) Patient Goals/Self-Care Activities: Over the next 120 days Attend scheduled medical appointments Utilize healthy coping skills and supportive resources discussed Contact PCP with any questions or concerns Keep 90 percent of counseling appointments Call your insurance provider for more information about your Enhanced Benefits  Check out counseling resources provided  Begin personal counseling with LCSW, to reduce and manage symptoms of Stress, until well-established with a new mental health therapist.  Incorporate into daily practice - relaxation techniques, deep breathing exercises, and mindfulness meditation strategies. Talk about feelings with friends, family members, spiritual advisor, etc. Contact LCSW directly [phone]), if you have questions, need assistance, or if additional social work needs are identified between now and our next scheduled telephone outreach call. Call 988 for mental health hotline/crisis line if needed (24/7 available) Try techniques to reduce symptoms of anxiety/negative thinking (deep breathing, distraction, positive self talk, etc)  - develop a personal safety plan - develop a plan to deal with triggers like holidays, anniversaries - exercise at least 2 to 3 times per week - have a plan for how  to handle bad days - journal feelings and what helps to feel better or worse - spend time or talk with others at least 2 to 3 times per week - watch for early signs of feeling worse - begin personal counseling - call and visit an old friend - check out volunteer opportunities - join a support group - laugh; watch a funny movie or comedian - learn and use visualization or guided imagery - perform a random act of kindness - practice relaxation or meditation daily - start or continue a personal journal - practice positive thinking and self-talk -continue with compliance of taking medication  -identify current effective and ineffective coping strategies.  -implement positive self-talk in care to increase self-esteem, confidence and feelings of control.  -consider alternative and complementary therapy approaches such as meditation, mindfulness or yoga.  Call your insurance provider to gain education on benefits if desired Call your primary care doctor if symptoms get worse  -journaling,  prayer, worship services, meditation or pastoral counseling.  -increase participation in pleasurable group activities such as hobbies, singing, sports or volunteering).  -consider the use of meditative movement therapy such as tai chi, yoga or qigong.  -start a regular daily exercise program based on tolerance, ability and patient choice to support positive thinking and activity    If you are experiencing a Mental Health or Behavioral Health Crisis or need someone to talk to, please call the Suicide and Crisis Lifeline: 988    Patient Goals: Initial goal     Follow up:  Patient agrees to Care Plan and Follow-up.  Plan: The Managed Medicaid care management team will reach out to the patient again over the next 30 days.  [name], BSW, MSW, [name] Managed Medicaid LCSW [facility]  [facility] [email or web address] Phone: [phone]

## 2023-01-22 DIAGNOSIS — R269 Unspecified abnormalities of gait and mobility: Secondary | ICD-10-CM | POA: Diagnosis not present

## 2023-01-23 DIAGNOSIS — R269 Unspecified abnormalities of gait and mobility: Secondary | ICD-10-CM | POA: Diagnosis not present

## 2023-01-24 DIAGNOSIS — R269 Unspecified abnormalities of gait and mobility: Secondary | ICD-10-CM | POA: Diagnosis not present

## 2023-01-28 DIAGNOSIS — R748 Abnormal levels of other serum enzymes: Secondary | ICD-10-CM | POA: Diagnosis not present

## 2023-01-28 DIAGNOSIS — R32 Unspecified urinary incontinence: Secondary | ICD-10-CM | POA: Diagnosis not present

## 2023-01-28 DIAGNOSIS — E890 Postprocedural hypothyroidism: Secondary | ICD-10-CM | POA: Diagnosis not present

## 2023-01-28 DIAGNOSIS — Z85850 Personal history of malignant neoplasm of thyroid: Secondary | ICD-10-CM | POA: Diagnosis not present

## 2023-01-28 DIAGNOSIS — N3944 Nocturnal enuresis: Secondary | ICD-10-CM | POA: Diagnosis not present

## 2023-01-28 DIAGNOSIS — Z8639 Personal history of other endocrine, nutritional and metabolic disease: Secondary | ICD-10-CM | POA: Diagnosis not present

## 2023-01-28 DIAGNOSIS — D3501 Benign neoplasm of right adrenal gland: Secondary | ICD-10-CM | POA: Diagnosis not present

## 2023-01-28 DIAGNOSIS — E270 Other adrenocortical overactivity: Secondary | ICD-10-CM | POA: Diagnosis not present

## 2023-01-28 DIAGNOSIS — E119 Type 2 diabetes mellitus without complications: Secondary | ICD-10-CM | POA: Diagnosis not present

## 2023-01-30 DIAGNOSIS — R269 Unspecified abnormalities of gait and mobility: Secondary | ICD-10-CM | POA: Diagnosis not present

## 2023-01-30 DIAGNOSIS — G4733 Obstructive sleep apnea (adult) (pediatric): Secondary | ICD-10-CM | POA: Diagnosis not present

## 2023-01-31 ENCOUNTER — Other Ambulatory Visit: Payer: Medicaid Other | Admitting: Licensed Clinical Social Worker

## 2023-01-31 DIAGNOSIS — R269 Unspecified abnormalities of gait and mobility: Secondary | ICD-10-CM | POA: Diagnosis not present

## 2023-01-31 NOTE — Patient Outreach (Signed)
Medicaid Managed Care Social Work Note  01/31/2023 Name:  Melinda [name] MRN:  [id number] DOB:  1966/05/09  Melinda [name] is an 57 y.o. year old female who is a primary patient of [place], [place], [place].  The Medicaid Managed Care Coordination team was consulted for assistance with:  Mental Health Counseling and Resources  Ms. Gilland was given information about Medicaid Managed Care Coordination team services today. Melinda [name] Patient agreed to services and verbal consent obtained.  Engaged with patient  for by telephone forfollow up visit in response to referral for case management and/or care coordination services.   Assessments/Interventions:  Review of past medical history, allergies, medications, health status, including review of consultants reports, laboratory and other test data, was performed as part of comprehensive evaluation and provision of chronic care management services.  SDOH: (Social Determinant of Health) assessments and interventions performed: SDOH Interventions    Flowsheet Row Patient Outreach Telephone from 01/21/2023 in [place] POPULATION HEALTH DEPARTMENT Patient Outreach Telephone from 01/13/2023 in [place] POPULATION HEALTH DEPARTMENT Patient Outreach Telephone from 12/13/2022 in [place] POPULATION HEALTH DEPARTMENT Patient Outreach Telephone from 11/08/2022 in [place] POPULATION HEALTH DEPARTMENT Patient Outreach Telephone from 10/10/2022 in [place] POPULATION HEALTH DEPARTMENT Patient Outreach Telephone from 09/02/2022 in [place] POPULATION HEALTH DEPARTMENT  SDOH Interventions        Housing Interventions -- -- -- Intervention Not Indicated -- --  Transportation Interventions -- -- Intervention Not Indicated -- -- --  Utilities Interventions -- -- -- -- Intervention Not Indicated --  Alcohol Usage Interventions -- -- -- -- -- Intervention Not Indicated (Score <7)  Financial Strain Interventions -- -- -- Intervention Not Indicated -- --  Physical  Activity Interventions -- -- -- -- -- Other (Comments)  [not able to engage in exercise described]  Stress Interventions Provide Counseling, [employer] Intervention Not Indicated -- -- -- --  Health Literacy Interventions -- Intervention Not Indicated -- -- -- --       Advanced Directives Status:  See Care Plan for related entries.  Care Plan                 Allergies  Allergen Reactions   Ceftriaxone Anaphylaxis and Other (See Comments)    ROCEPHIN   Metformin Anaphylaxis    ALL   Penicillins Shortness Of Breath    Has patient had a PCN reaction causing immediate rash, facial/tongue/throat swelling, SOB or lightheadedness with hypotension: Yes Has patient had a PCN reaction causing severe rash involving mucus membranes or skin necrosis: No Has patient had a PCN reaction that required hospitalization No Has patient had a PCN reaction occurring within the last 10 years: No If all of the above answers are "NO", then may proceed with Cephalosporin use.    Shellfish Allergy Anaphylaxis   Flonase [Fluticasone Propionate]     Makes migraines worse   Gold-Containing Drug Products     HANDS ITCH   Metformin Hcl Other (See Comments)   Nickel     HANDS SWELL   Penicillin G Other (See Comments)   Citrus Rash    Medications Reviewed Today   Medications were not reviewed in this encounter     Patient Active Problem List   Diagnosis Date Noted   Postoperative hypothyroidism 01/21/2022   Esophageal dysphagia 01/21/2022   History of DVT (deep vein thrombosis) 01/21/2022   History of malignant neoplasm of thyroid 01/21/2022   Long term (current) use of anticoagulants 01/21/2022  Migraine without aura, not refractory 01/21/2022   Type 2 diabetes mellitus with hyperglycemia (HCC) 01/21/2022   Spinal stenosis excluding cervical region 11/21/2021   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   Abdominal pannus 08/21/2021   Hardening of the aorta (main  artery of the heart) (HCC) 08/21/2021   At high risk for falls 04/20/2020   Hyperlipidemia 02/16/2020   Adrenal mass, right (HCC) 04/29/2019   Chronic cough 03/12/2018   Left shoulder pain 10/14/2016   Primary hypertension 10/14/2016   S/P thyroidectomy 06/27/2016   Chronic bilateral low back pain without sciatica 04/22/2016   Liver lesion 12/24/2015   Malignant tumor of thyroid gland (HCC) 11/16/2015   Controlled type 2 diabetes mellitus with complication, with long-term current use of insulin (HCC) 10/10/2015   Vision loss 08/25/2015   Decreased vision in both eyes 08/25/2015   Prolonged Q-T interval on ECG 08/19/2015   History of pulmonary embolism 08/10/2015   Mild persistent asthma 04/13/2015   Nonruptured cerebral aneurysm 04/13/2015   Fibroid, uterine    Morbid obesity (HCC) 11/11/2014   Atypical chest pain 11/08/2014   Sinus tachycardia 11/08/2014   Migraine 10/14/2014   Menorrhagia 09/08/2014    Conditions to be addressed/monitored per PCP order:  Depression  Care Plan : LCSW plan of care  Updates made by [name], LCSW since 01/31/2023 12:00 AM     Problem: Therapy      Goal: Therapy Needs Identified   Start Date: 01/21/2023  Note:   Priority: High  Timeframe:  Short-Range Goal Priority:  High Start Date:   01/21/23          Expected End Date:  ongoing                     Follow Up Date--02/28/23 at 200 pm.  - keep 90 percent of scheduled appointments- counseling and psychiatry -consider bumping up your self-care  -consider creating a stronger support network    Current barriers:   Mental Health needs related to depression/stress management. Patient requires Support, Education, Resources, Referrals, Advocacy, and Care Coordination, in order to meet Unmet Mental Health Needs. Will lose current therapy at [facility] as of 01/30/23 Patient will implement clinical interventions discussed today to decrease symptoms of depression and increase knowledge and/or  ability of: coping skills. Patient lacks knowledge of available community counseling agencies and resources.  Clinical Goal(s): verbalize understanding of plan for management of Stress and demonstrate a reduction in symptoms. Patient will connect with a provider for ongoing mental health treatment, increase coping skills, healthy habits, self-management skills, and stress reduction        Clinical Interventions:  Assessed patient's previous and current treatment, coping skills, support system and barriers to care. Patient provided hx  Verbalization of feelings encouraged, motivational interviewing employed Emotional support provided, positive coping strategies explored. As of 01/30/23 will no longer be able to keep seeing therapist at [facility] as they will not take [facility] Managed Medicaid anymore. Patient has a psychiatrist at [facility] and was advised to contact agency to question if she can start therapy there. Joint phone call made to [facility] and voice message left inquiring about warm transfer between therapist.  Establishing healthy boundaries emphasized and healthy self-care education provided Patient was educated on available mental health resources within their area that accept Medicaid and offer counseling. Email sent to patient today with available mental health resources within her area that accept Medicaid and offer the services that she  is interested in. Email included some crisis support resources and [facility]'s walk in clinic hours.  Patient will work on implementing appropriate self-care habits into their daily routine such as: staying positive, writing a gratitude list, drinking water, staying active around the house, taking their medications as prescribed, combating negative thoughts or emotions and staying connected with their family and friends. Positive reinforcement provided for this decision to work on this. Motivational Interviewing  employed Depression screen reviewed  PHQ2/ PHQ9 completed or reviewed  Mindfulness or Relaxation training provided Active listening / Reflection utilized  Advance Care and HCPOA education provided Emotional Support Provided Problem Solving /Task Center strategies reviewed Provided psychoeducation for mental health needs  Provided brief CBT  Reviewed mental health medications and discussed importance of compliance:  Quality of sleep assessed & Sleep Hygiene techniques promoted  Participation in counseling encouraged  Verbalization of feelings encouraged  Suicidal Ideation/Homicidal Ideation assessed: Patient denies SI/HI  Review resources, discussed options and provided patient information about  Mental Health Resources Inter-disciplinary care team collaboration (see longitudinal plan of care) Patient reports that she reviewed resources that [facility] LCSW sent her by email and called [facility] and reports that they will be able to take her for therapy mid August of 2024. She was advised to contact them in two weeks to follow up on this and to contact this [facility] LCSW directly if she needs any assistance with their enrollment process.  Patient Goals/Self-Care Activities: Over the next 120 days Attend scheduled medical appointments Utilize healthy coping skills and supportive resources discussed Contact PCP with any questions or concerns Keep 90 percent of counseling appointments Call your insurance provider for more information about your Enhanced Benefits  Check out counseling resources provided  Begin personal counseling with LCSW, to reduce and manage symptoms of Stress, until well-established with a new mental health therapist.  Incorporate into daily practice - relaxation techniques, deep breathing exercises, and mindfulness meditation strategies. Talk about feelings with friends, family members, spiritual advisor, etc. Contact LCSW directly [phone]), if you have questions, need  assistance, or if additional social work needs are identified between now and our next scheduled telephone outreach call. Call 988 for mental health hotline/crisis line if needed (24/7 available) Try techniques to reduce symptoms of anxiety/negative thinking (deep breathing, distraction, positive self talk, etc)  - develop a personal safety plan - develop a plan to deal with triggers like holidays, anniversaries - exercise at least 2 to 3 times per week - have a plan for how to handle bad days - journal feelings and what helps to feel better or worse - spend time or talk with others at least 2 to 3 times per week - watch for early signs of feeling worse - begin personal counseling - call and visit an old friend - check out volunteer opportunities - join a support group - laugh; watch a funny movie or comedian - learn and use visualization or guided imagery - perform a random act of kindness - practice relaxation or meditation daily - start or continue a personal journal - practice positive thinking and self-talk -continue with compliance of taking medication  -identify current effective and ineffective coping strategies.  -implement positive self-talk in care to increase self-esteem, confidence and feelings of control.  -consider alternative and complementary therapy approaches such as meditation, mindfulness or yoga.  Call your insurance provider to gain education on benefits if desired Call your primary care doctor if symptoms get worse  -journaling, prayer, worship services, meditation or  pastoral counseling.  -increase participation in pleasurable group activities such as hobbies, singing, sports or volunteering).  -consider the use of meditative movement therapy such as tai chi, yoga or qigong.  -start a regular daily exercise program based on tolerance, ability and patient choice to support positive thinking and activity    If you are experiencing a Mental Health or Behavioral  Health Crisis or need someone to talk to, please call the Suicide and Crisis Lifeline: 988       08/02/2022    9:10 AM 12/24/2021   10:26 AM 07/27/2021    2:00 PM 05/30/2021    9:49 AM 04/12/2021    1:08 PM  Depression screen PHQ 2/9  Decreased Interest 1 3 2 1 1   Down, Depressed, Hopeless 1 2 2 1 1   PHQ - 2 Score 2 5 4 2 2   Altered sleeping 1 1 2 2 1   Tired, decreased energy 1 1 2 2 1   Change in appetite 1 2 0 2 1  Feeling bad or failure about yourself  1 1 0 0   Trouble concentrating 1 3 3 3  0  Moving slowly or fidgety/restless 0 2 0 2 0  Suicidal thoughts 0 0 0 0 0  PHQ-9 Score 7 15 11 13 5   Difficult doing work/chores Not difficult at all  Extremely dIfficult  Not difficult at all      12/24/2021   10:26 AM 05/30/2021    9:50 AM 12/19/2020   10:09 AM 04/20/2020   11:34 AM  GAD 7 : Generalized Anxiety Score  Nervous, Anxious, on Edge 2 2 2 1   Control/stop worrying 1 0 2 0  Worry too much - different things 1 0 2 0  Trouble relaxing 1 1 2 1   Restless 2 2 2 3   Easily annoyed or irritable 2 2 0 0  Afraid - awful might happen 0 0 0 0  Total GAD 7 Score 9 7 10 5        24- Hour Availability:    [facility]  [address] [place], [place] [phone] Crisis [phone]   [facility] [phone]  [place] [facility]  [phone]    [facility]  [phone] (after hours)   Therapeutic Alternative/Mobile Crisis   [phone]   [place] National Suicide Hotline  (203)732-0777 (TALK) [place] [address]   Call [phone] for mental health emergencies   [facility]  ([phone]);  [place] and [employer]  831-796-2013); [place], [place], [place], [place], [place], [place], [place]    [place] [facility] for [facility] Residents For 24/7 walk-up access to mental health services for [facility] children (4+), adolescents and adults, please  visit the [facility] located at [address] in [place], [place].  *[facility] also provides comprehensive outpatient behavioral health services in a variety of locations around the [place].  Connect With [place] [address] [place], [place] [address] HelpLine: (248)124-9651 or [phone]  Get Directions  Find Help 24/7 By Phone Call our 24-hour HelpLine at 360-717-1769 or (504) 583-8023 for immediate assistance for mental health and substance abuse issues.  Walk-In Help [place]: [facility] (Ages 4 and Up) [place]: Emergency Dept., [facility] Additional Resources National Hopeline Network: 1-800-SUICIDE The National Suicide Prevention Lifeline: 1-800-273-TALK          Follow up:  Patient agrees to Care  Plan and Follow-up.  Plan: The Managed Medicaid care management team will reach out to the patient again over the next 30 days.  [name], BSW, MSW, [name] Managed Medicaid LCSW [facility]  [facility] [email or web address] Phone: [phone]

## 2023-01-31 NOTE — Patient Instructions (Signed)
Visit Information  Melinda [name] was given information about Medicaid Managed Care team care coordination services as a part of their [facility] Community Plan Medicaid benefit. Melinda [name] verbally consented to engagement with the [facility] Managed Care team.   If you are experiencing a medical emergency, please call 911 or report to your local emergency department or urgent care.   If you have a non-emergency medical problem during routine business hours, please contact your provider's office and ask to speak with a nurse.   For questions related to your [facility], please call: [phone] or visit the homepage here: [email or web address]  If you would like to schedule transportation through your [facility], please call the following number at least 2 days in advance of your appointment: [phone]   Rides for urgent appointments can also be made after hours by calling Member Services.  Call the Behavioral Health Crisis Line at [phone], at any time, 24 hours a day, 7 days a week. If you are in danger or need immediate medical attention call 911.  If you would like help to quit smoking, call 1-800-QUIT-NOW (641-418-2965) OR Espaol: 1-855-Djelo-Ya (8-841-660-6301) o para ms informacin haga clic aqu or Text READY to 601-093 to register via text  Following is a copy of your plan of care:  Care Plan : LCSW plan of care  Updates made by [name], LCSW since 01/31/2023 12:00 AM     Problem: Therapy      Goal: Therapy Needs Identified   Start Date: 01/21/2023  Note:   Priority: High  Timeframe:  Short-Range Goal Priority:  High Start Date:   01/21/23          Expected End Date:  ongoing                     Follow Up Date--02/28/23 at 200 pm.  - keep 90 percent of scheduled appointments- counseling and psychiatry -consider bumping up your self-care  -consider  creating a stronger support network    Current barriers:   Mental Health needs related to depression/stress management. Patient requires Support, Education, Resources, Referrals, Advocacy, and Care Coordination, in order to meet Unmet Mental Health Needs. Will lose current therapy at [facility] as of 01/30/23 Patient will implement clinical interventions discussed today to decrease symptoms of depression and increase knowledge and/or ability of: coping skills. Patient lacks knowledge of available community counseling agencies and resources.  Clinical Goal(s): verbalize understanding of plan for management of Stress and demonstrate a reduction in symptoms. Patient will connect with a provider for ongoing mental health treatment, increase coping skills, healthy habits, self-management skills, and stress reduction        Patient Goals/Self-Care Activities: Over the next 120 days Attend scheduled medical appointments Utilize healthy coping skills and supportive resources discussed Contact PCP with any questions or concerns Keep 90 percent of counseling appointments Call your insurance provider for more information about your Enhanced Benefits  Check out counseling resources provided  Begin personal counseling with LCSW, to reduce and manage symptoms of Stress, until well-established with a new mental health therapist.  Incorporate into daily practice - relaxation techniques, deep breathing exercises, and mindfulness meditation strategies. Talk about feelings with friends, family members, spiritual advisor, etc. Contact LCSW directly [phone]), if you have questions, need assistance, or if additional social work needs are identified between now and our next scheduled telephone outreach call. Call 988 for mental health hotline/crisis line if needed (  24/7 available) Try techniques to reduce symptoms of anxiety/negative thinking (deep breathing, distraction, positive self talk, etc)  - develop a  personal safety plan - develop a plan to deal with triggers like holidays, anniversaries - exercise at least 2 to 3 times per week - have a plan for how to handle bad days - journal feelings and what helps to feel better or worse - spend time or talk with others at least 2 to 3 times per week - watch for early signs of feeling worse - begin personal counseling - call and visit an old friend - check out volunteer opportunities - join a support group - laugh; watch a funny movie or comedian - learn and use visualization or guided imagery - perform a random act of kindness - practice relaxation or meditation daily - start or continue a personal journal - practice positive thinking and self-talk -continue with compliance of taking medication  -identify current effective and ineffective coping strategies.  -implement positive self-talk in care to increase self-esteem, confidence and feelings of control.  -consider alternative and complementary therapy approaches such as meditation, mindfulness or yoga.  Call your insurance provider to gain education on benefits if desired Call your primary care doctor if symptoms get worse  -journaling, prayer, worship services, meditation or pastoral counseling.  -increase participation in pleasurable group activities such as hobbies, singing, sports or volunteering).  -consider the use of meditative movement therapy such as tai chi, yoga or qigong.  -start a regular daily exercise program based on tolerance, ability and patient choice to support positive thinking and activity    If you are experiencing a Mental Health or Behavioral Health Crisis or need someone to talk to, please call the Suicide and Crisis Lifeline: 988    10 LITTLE Things To Do When You're Feeling Too Down To Do Anything  Take a shower. Even if you plan to stay in all day long and not see a soul, take a shower. It takes the most effort to hop in to the shower but once you do, you'll  feel immediate results. It will wake you up and you'll be feeling much fresher (and cleaner too).  Brush and floss your teeth. Give your teeth a good brushing with a floss finish. It's a small task but it feels so good and you can check 'taking care of your health' off the list of things to do.  Do something small on your list. Most of [place] have some small thing we would like to get done (load of laundry, sew a button, email a friend). Doing one of these things will make you feel like you've accomplished something.  Drink water. Drinking water is easy right? It's also really beneficial for your health so keep a glass beside you all day and take sips often. It gives you energy and prevents you from boredom eating.  Do some floor exercises. The last thing you want to do is exercise but it might be just the thing you need the most. Keep it simple and do exercises that involve sitting or laying on the floor. Even the smallest of exercises release chemicals in the brain that make you feel good. Yoga stretches or core exercises are going to make you feel good with minimal effort.  Make your bed. Making your bed takes a few minutes but it's productive and you'll feel relieved when it's done. An unmade bed is a huge visual reminder that you're having an unproductive day. Do it and  consider it your housework for the day.  Put on some nice clothes. Take the sweatpants off even if you don't plan to go anywhere. Put on clothes that make you feel good. Take a look in the mirror so your brain recognizes the sweatpants have been replaced with clothes that make you look great. It's an instant confidence booster.  Wash the dishes. A pile of dirty dishes in the sink is a reflection of your mood. It's possible that if you wash up the dishes, your mood will follow suit. It's worth a try.  Cook a real meal. If you have the luxury to have a "do nothing" day, you have time to make a real meal for yourself. Make a  meal that you love to eat. The process is good to get you out of the funk and the food will ensure you have more energy for tomorrow.  Write out your thoughts by hand. When you hand write, you stimulate your brain to focus on the moment that you're in so make yourself comfortable and write whatever comes into your mind. Put those thoughts out on paper so they stop spinning around in your head. Those thoughts might be the very thing holding you down.

## 2023-02-03 DIAGNOSIS — R269 Unspecified abnormalities of gait and mobility: Secondary | ICD-10-CM | POA: Diagnosis not present

## 2023-02-04 DIAGNOSIS — R269 Unspecified abnormalities of gait and mobility: Secondary | ICD-10-CM | POA: Diagnosis not present

## 2023-02-05 DIAGNOSIS — R269 Unspecified abnormalities of gait and mobility: Secondary | ICD-10-CM | POA: Diagnosis not present

## 2023-02-06 DIAGNOSIS — R269 Unspecified abnormalities of gait and mobility: Secondary | ICD-10-CM | POA: Diagnosis not present

## 2023-02-07 DIAGNOSIS — R269 Unspecified abnormalities of gait and mobility: Secondary | ICD-10-CM | POA: Diagnosis not present

## 2023-02-10 ENCOUNTER — Ambulatory Visit: Payer: Medicaid Other | Admitting: Surgery

## 2023-02-10 DIAGNOSIS — R269 Unspecified abnormalities of gait and mobility: Secondary | ICD-10-CM | POA: Diagnosis not present

## 2023-02-10 DIAGNOSIS — G4733 Obstructive sleep apnea (adult) (pediatric): Secondary | ICD-10-CM | POA: Diagnosis not present

## 2023-02-11 DIAGNOSIS — R269 Unspecified abnormalities of gait and mobility: Secondary | ICD-10-CM | POA: Diagnosis not present

## 2023-02-12 DIAGNOSIS — R269 Unspecified abnormalities of gait and mobility: Secondary | ICD-10-CM | POA: Diagnosis not present

## 2023-02-13 DIAGNOSIS — R269 Unspecified abnormalities of gait and mobility: Secondary | ICD-10-CM | POA: Diagnosis not present

## 2023-02-14 ENCOUNTER — Encounter: Payer: Self-pay | Admitting: Obstetrics and Gynecology

## 2023-02-14 ENCOUNTER — Other Ambulatory Visit: Payer: Medicaid Other | Admitting: Obstetrics and Gynecology

## 2023-02-14 DIAGNOSIS — R269 Unspecified abnormalities of gait and mobility: Secondary | ICD-10-CM | POA: Diagnosis not present

## 2023-02-14 NOTE — Patient Instructions (Signed)
Hi Melinda [name], thank you for updating me today-have a wonderful afternoon and weekend!  Melinda [name] was given information about Medicaid Managed Care team care coordination services as a part of their [facility] Medicaid benefit. Melinda [name] verbally consented to engagement with the [facility] Managed Care team.   If you are experiencing a medical emergency, please call 911 or report to your local emergency department or urgent care.   If you have a non-emergency medical problem during routine business hours, please contact your provider's office and ask to speak with a nurse.   For questions related to your [facility], please call: [phone] or visit the homepage here: [email or web address]  If you would like to schedule transportation through your [facility], please call the following number at least 2 days in advance of your appointment: [phone]   Rides for urgent appointments can also be made after hours by calling Member Services.  Call the Behavioral Health Crisis Line at [phone], at any time, 24 hours a day, 7 days a week. If you are in danger or need immediate medical attention call 911.  If you would like help to quit smoking, call 1-800-QUIT-NOW (206 439 8505) OR Espaol: 1-855-Djelo-Ya (1-324-401-0272) o para ms informacin haga clic aqu or Text READY to 536-644 to register via text  Melinda [name] - following are the goals we discussed in your visit today:   Goals Addressed             This Visit's Progress    Protect My Health         Timeframe:  Long-Range Goal Priority:  Medium Start Date:    07/14/20                         Expected End Date:  ongoing          Follow Up Date: 03/25/23 - schedule appointment for vaccines needed due to my age or health - schedule recommended health tests (blood work, mammogram, colonoscopy, pap test) -  schedule and keep appointment for annual check-up  02/14/23:  patient recently seen and evaluated by ENDO and eye provider, upcoming appts with PCP, surgery, DERM, and POD   Patient verbalizes understanding of instructions and care plan provided today and agrees to view in MyChart. Active MyChart status and patient understanding of how to access instructions and care plan via MyChart confirmed with patient.     The Managed Medicaid care management team will reach out to the patient again over the next 30 business  days.  The  Patient has been provided with contact information for the Managed Medicaid care management team and has been advised to call with any health related questions or concerns.   [name], BSN [place]  [facility] Care Management Coordinator - Managed Medicaid High Risk [phone]   Following is a copy of your plan of care:  Care Plan : General Plan of Care (Adult)  Updates made by [name], RN since 02/14/2023 12:00 AM     Problem: Health Promotion or Disease Self-Management (General Plan of Care)   Priority: Medium  Onset Date: 10/20/2020     Long-Range Goal: Chronic Disease Management   Start Date: 07/14/2020  Expected End Date: 04/15/2023  Recent Progress: On track  Priority: Medium  Note:    Current Barriers:  Chronic Disease Management support and education needs related to DM, HTN, chronic pain,  migraines, asthma, HLD, anxiety, depression Patient with anxiety and depression-has appointment with Psychiatrist  scheduled every 3 months, therapist every 2 weeks 02/14/23:  Patient to call Monday to find out about new therapy appt.  Blood sugar 120-130, no available BP readings today.  Breathing WNL and no swelling-uses CPAP about 4 hours a day.     Nurse Case Manager Clinical Goal(s):  Over the next 90 days, patient will attend all scheduled medical appointments:  Over the next 30 days, patient will work with CM team pharmacist to  review medications  Interventions:  Inter-disciplinary care team collaboration (see longitudinal plan of care) Evaluation of current treatment plan  and patient's adherence to plan as established by provider. Reviewed medications with patient. Collaborated with pharmacy regarding medications. Discussed plans with patient for ongoing care management follow up and provided patient with direct contact information for care management team Reviewed scheduled/upcoming provider appointments. Collaborated with SW for psychotherapy referral. SW referral for psychotherapy provider-completed Pharmacy referral for medication review-completed Collaborated with PCP for new BP cuff-completed   Asthma: (Status:New goal.) Long Term Goal Advised patient to track and manage Asthma triggers Provided education about and advised patient to utilize infection prevention strategies to reduce risk of respiratory infection Discussed the importance of adequate rest and management of fatigue with Asthma Screening for signs and symptoms of depression related to chronic disease state  Assessed social determinant of health barriers   Diabetes Interventions:  (Status:  New goal.) Long Term Goal Assessed patient's understanding of A1c goal: <7% Reviewed medications with patient and discussed importance of medication adherence Discussed plans with patient for ongoing care management follow up and provided patient with direct contact information for care management team Reviewed scheduled/upcoming provider appointments  Advised patient, providing education and rationale, to check cbg as directed  and record, calling provider  for findings outside established parameters Review of patient status, including review of consultants reports, relevant laboratory and other test results, and medications completed Screening for signs and symptoms of depression related to chronic disease state  Assessed social determinant of health  barriers Lab Results  Component Value Date   HGBA1C 6.1 04/20/2021       HGBA1C                                                      6.2                                    09/26/22  Hyperlipidemia Interventions:  (Status:  New goal.) Long Term Goal Medication review performed; medication list updated in electronic medical record.  Provider established cholesterol goals reviewed Counseled on importance of regular laboratory monitoring as prescribed Reviewed importance of limiting foods high in cholesterol Screening for signs and symptoms of depression related to chronic disease state Assessed social determinant of health barriers   Hypertension Interventions:  (Status:  New goal.) Long Term Goal Last practice recorded BP readings:  BP Readings from Last 3 Encounters:           10/09/22         115/69 11/08/22:        140/68 01/06/23         100/64  Most recent eGFR/CrCl:  Lab Results  Component  Value Date   EGFR 68 12/19/2020    No components found for: "CRCL"  Evaluation of current treatment plan related to hypertension self management and patient's adherence to plan as established by provider Reviewed medications with patient and discussed importance of compliance Discussed plans with patient for ongoing care management follow up and provided patient with direct contact information for care management team Advised patient, providing education and rationale, to monitor blood pressure daily and record, calling PCP for findings outside established parameters Reviewed scheduled/upcoming provider appointments including:  Screening for signs and symptoms of depression related to chronic disease state  Assessed social determinant of health barriers  Pain Interventions:  (Status:  New goal.) Long Term Goal Pain assessment performed Medications reviewed Reviewed provider established plan for pain management Discussed importance of adherence to all scheduled medical  appointments Counseled on the importance of reporting any/all new or changed pain symptoms or management strategies to pain management provider Advised patient to report to care team affect of pain on daily activities Reviewed with patient prescribed pharmacological and nonpharmacological pain relief strategies Screening for signs and symptoms of depression related to chronic disease state  Assessed social determinant of health barriers   Self Care Activities: Over the next 90 days, patient will:  -Self administers medications as prescribed Attends all scheduled provider appointments Calls pharmacy for medication refills Calls provider office for new concerns or questions Patient will locate meter to check blood sugars-completed Schedule eye provider appt.-completed Follow up with provider regarding blood sugars-completed  Follow Up Plan: The Managed Medicaid care management team will reach out to the patient again over the next 30  business days.  The patient has been provided with contact information for the Managed Medicaid care management team and has been advised to call with any health related questions or concerns.

## 2023-02-14 NOTE — Patient Outreach (Signed)
Medicaid Managed Care   Nurse Care Manager Note  02/14/2023 Name:  Melinda [name] MRN:  [id number] DOB:  06/10/1966  Melinda [name] is an 57 y.o. year old female who is a primary patient of [place], [place], [place].  The [facility] Managed Care Coordination team was consulted for assistance with:    Chronic healthcare management needs, h/o thyroid cancer, HTN, DM, chronic pain, migraines, anxiety/depression/PTSD, asthma, glaucoma, SS  Melinda [name] was given information about Medicaid Managed Care Coordination team services today. Melinda [name] Patient agreed to services and verbal consent obtained.  Engaged with patient by telephone for follow up visit in response to provider referral for case management and/or care coordination services.   Assessments/Interventions:  Review of past medical history, allergies, medications, health status, including review of consultants reports, laboratory and other test data, was performed as part of comprehensive evaluation and provision of chronic care management services.  SDOH (Social Determinants of Health) assessments and interventions performed: SDOH Interventions    Flowsheet Row Patient Outreach Telephone from 02/14/2023 in [place] POPULATION HEALTH DEPARTMENT Patient Outreach Telephone from 01/21/2023 in [place] POPULATION HEALTH DEPARTMENT Patient Outreach Telephone from 01/13/2023 in [place] POPULATION HEALTH DEPARTMENT Patient Outreach Telephone from 12/13/2022 in [place] POPULATION HEALTH DEPARTMENT Patient Outreach Telephone from 11/08/2022 in [place] POPULATION HEALTH DEPARTMENT Patient Outreach Telephone from 10/10/2022 in [place] POPULATION HEALTH DEPARTMENT  SDOH Interventions        Food Insecurity Interventions Intervention Not Indicated -- -- -- -- --  Housing Interventions -- -- -- -- Intervention Not Indicated --  Transportation Interventions -- -- -- Intervention Not Indicated -- --  Utilities Interventions -- -- -- -- --  Intervention Not Indicated  Depression Interventions/Treatment  Medication, Currently on Treatment, Counseling  [in process of establishing with a new therapist/counselor due to insurance.] -- -- -- -- --  Financial Strain Interventions -- -- -- -- Intervention Not Indicated --  Stress Interventions -- Provide Counseling, [employer] Intervention Not Indicated -- -- --  Health Literacy Interventions -- -- Intervention Not Indicated -- -- --     Care Plan Allergies  Allergen Reactions   Ceftriaxone Anaphylaxis and Other (See Comments)    ROCEPHIN   Metformin Anaphylaxis    ALL   Penicillins Shortness Of Breath    Has patient had a PCN reaction causing immediate rash, facial/tongue/throat swelling, SOB or lightheadedness with hypotension: Yes Has patient had a PCN reaction causing severe rash involving mucus membranes or skin necrosis: No Has patient had a PCN reaction that required hospitalization No Has patient had a PCN reaction occurring within the last 10 years: No If all of the above answers are "NO", then may proceed with Cephalosporin use.    Shellfish Allergy Anaphylaxis   Flonase [Fluticasone Propionate]     Makes migraines worse   Gold-Containing Drug Products     HANDS ITCH   Metformin Hcl Other (See Comments)   Nickel     HANDS SWELL   Penicillin G Other (See Comments)   Citrus Rash    Medications Reviewed Today     Reviewed by [name], RN (Registered Nurse) on 02/14/23 at 1308  Med List Status: <None>   Medication Order Taking? Sig Documenting Provider Last Dose Status Informant  acetaminophen (TYLENOL) 500 MG tablet [id number] No Take 1 tablet (500 mg total) by mouth every 6 (six) hours as needed. Kerrin Champagne, MD Taking Active   albuterol (PROVENTIL) (2.5 MG/3ML)  0.083% nebulizer solution [id number] No Take 3 mLs (2.5 mg total) by nebulization every 6 (six) hours as needed for wheezing or shortness of breath. [name], MD  Taking Active Self  albuterol (VENTOLIN HFA) 108 (90 Base) MCG/ACT inhaler [id number] No Inhale 2 puffs into the lungs every 6 (six) hours as needed for wheezing or shortness of breath. [name], MD Taking Active Self  apixaban (ELIQUIS) 5 MG TABS tablet [id number] No TAKE ONE TABLET BY MOUTH TWICE DAILY MORNING AND [name], MD Taking Active   atorvastatin (LIPITOR) 20 MG tablet [id number] No TAKE ONE TABLET BY MOUTH ONCE DAILY AT BEDTIME [name], MD Taking Active   budesonide-formoterol (SYMBICORT) 160-4.5 MCG/ACT inhaler [id number] No Inhale 2 puffs into the lungs 2 (two) times daily. [name], MD Taking Active   buPROPion (WELLBUTRIN XL) 300 MG 24 hr tablet [id number] No Take 300 mg by mouth daily.  [provider] Taking Active Self  busPIRone (BUSPAR) 10 MG tablet [id number] No Take 10 mg by mouth 2 (two) times daily. [provider] Taking Active Self           Med Note [name], [facility] I   Wed Feb 18, 2018 10:03 PM)    Cholecalciferol (VITAMIN D3) 10 MCG (400 UNIT) CAPS [id number] No TAKE ONE CAPSULE BY MOUTH ONCE DAILY (AM) [name], MD Taking Active   cyclobenzaprine (FLEXERIL) 10 MG tablet [id number] No TAKE 1 TABLET (10 MG TOTAL) BY MOUTH 3 (THREE) TIMES DAILY AS NEEDED FOR MUSCLE SPASMS. [name], MD Taking Active   doxepin (SINEQUAN) 50 MG capsule [id number] No Take 150 mg by mouth at bedtime. [provider] Taking Active Self  Erenumab-aooe (AIMOVIG) 140 MG/ML SOAJ [id number]  Inject 140 mg into the skin every 28 (twenty-eight) days. [name], DO  Active   gabapentin (NEURONTIN) 300 MG capsule [id number] No TAKE ONE CAPSULE BY MOUTH THREE TIMES DAILY (AM+NOON+BEDTIME) [name], MD Taking Active   loratadine (CLARITIN) 10 MG tablet [id number] No TAKE 1 TABLET (10 MG TOTAL) BY MOUTH DAILY (AM) [name], MD Taking Active   metoprolol tartrate (LOPRESSOR) 50 MG tablet [id number] No TAKE ONE  TABLET BY MOUTH TWICE DAILY (AM+BEDTIME) [name], MD Taking Active   montelukast (SINGULAIR) 10 MG tablet [id number] No TAKE 1 TABLET BY MOUTH EVERY EVENING AT BEDTIME [name], MD Taking Active   nystatin-triamcinolone ointment [facility]) [id number] No Apply 1 Application topically 2 (two) times daily. Apply topically two times daily for five days Lebaron Bautch Piedra, DO Taking Active   OZEMPIC, 1 MG/DOSE, 4 MG/3ML SOPN [id number] No Inject 2 mg into the skin once a week. [provider] Taking Active Self  prazosin (MINIPRESS) 2 MG capsule [id number] No Take 6 mg by mouth at bedtime. [provider] Taking Active Self  SYNTHROID 200 MCG tablet [id number] No Take 1 tablet (200 mcg total) by mouth daily before breakfast.  Patient taking differently: Take 274 mcg by mouth daily before breakfast. Takes 2 tablets in the morning   [name], MD Taking Active Self  topiramate (TOPAMAX) 200 MG tablet [id number]  Take 1 tablet (200 mg total) by mouth at bedtime. [name], DO  Active   traZODone (DESYREL) 50 MG tablet [id number] No Take 50 mg by mouth at bedtime. [provider] Taking Active Self  VRAYLAR 1.5 MG capsule [id number] No Take 1.5 mg by mouth daily. 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   Abdominal pannus 08/21/2021   Hardening of the aorta (main artery of the heart) (HCC) 08/21/2021   At high risk for  falls 04/20/2020   Hyperlipidemia 02/16/2020   Adrenal mass, right (HCC) 04/29/2019   Chronic cough 03/12/2018   Left shoulder pain 10/14/2016   Primary hypertension 10/14/2016   S/P thyroidectomy 06/27/2016   Chronic bilateral low back pain without sciatica 04/22/2016   Liver lesion 12/24/2015   Malignant tumor of thyroid gland (HCC) 11/16/2015   Controlled type 2 diabetes mellitus with complication, with long-term current use of insulin (HCC) 10/10/2015   Vision loss 08/25/2015   Decreased vision in both eyes 08/25/2015   Prolonged Q-T interval on ECG 08/19/2015   History of pulmonary embolism 08/10/2015   Mild persistent asthma 04/13/2015   Nonruptured cerebral aneurysm 04/13/2015   Fibroid, uterine    Morbid obesity (HCC) 11/11/2014   Atypical chest pain 11/08/2014   Sinus tachycardia 11/08/2014   Migraine 10/14/2014   Menorrhagia 09/08/2014   Conditions to be addressed/monitored per PCP order:  Chronic healthcare management needs, h/o thyroid cancer, HTN, DM, chronic pain, migraines, anxiety/depression/PTSD, asthma, glaucoma, SS  Care Plan : General Plan of Care (Adult)  Updates made by [name], RN since 02/14/2023 12:00 AM     Problem: Health Promotion or Disease Self-Management (General Plan of Care)   Priority: Medium  Onset Date: 10/20/2020     Long-Range Goal: Chronic Disease Management   Start Date: 07/14/2020  Expected End Date: 04/15/2023  Recent Progress: On track  Priority: Medium  Note:    Current Barriers:  Chronic Disease Management support and education needs related to DM, HTN, chronic pain, migraines, asthma, HLD, anxiety, depression Patient with anxiety and depression-has appointment with Psychiatrist  scheduled every 3 months, therapist every 2 weeks 02/14/23:  Patient to call Monday to find out about new therapy appt.  Blood sugar 120-130, no available BP readings today.  Breathing WNL and no swelling-uses CPAP about 4 hours a day.     Nurse  Case Manager Clinical Goal(s):  Over the next 90 days, patient will attend all scheduled medical appointments:  Over the next 30 days, patient will work with CM team pharmacist to review medications  Interventions:  Inter-disciplinary care team collaboration (see longitudinal plan of care) Evaluation of current treatment plan  and patient's adherence to plan as established by provider. Reviewed medications with patient. Collaborated with pharmacy regarding medications. Discussed plans with patient for ongoing care management follow up and provided patient with direct contact information for care management team Reviewed scheduled/upcoming provider appointments. Collaborated with SW for psychotherapy referral. SW referral for psychotherapy provider-completed Pharmacy referral for medication review-completed Collaborated with PCP for new BP cuff-completed   Asthma: (Status:New goal.) Long Term Goal Advised patient to track and manage Asthma triggers Provided education about and advised patient to utilize infection prevention strategies to reduce risk of respiratory infection Discussed the importance of adequate rest and management of fatigue with Asthma Screening for signs and symptoms of depression related to  chronic disease state  Assessed social determinant of health barriers   Diabetes Interventions:  (Status:  New goal.) Long Term Goal Assessed patient's understanding of A1c goal: <7% Reviewed medications with patient and discussed importance of medication adherence Discussed plans with patient for ongoing care management follow up and provided patient with direct contact information for care management team Reviewed scheduled/upcoming provider appointments  Advised patient, providing education and rationale, to check cbg as directed  and record, calling provider  for findings outside established parameters Review of patient status, including review of consultants reports, relevant  laboratory and other test results, and medications completed Screening for signs and symptoms of depression related to chronic disease state  Assessed social determinant of health barriers Lab Results  Component Value Date   HGBA1C 6.1 04/20/2021       HGBA1C                                                      6.2                                    09/26/22  Hyperlipidemia Interventions:  (Status:  New goal.) Long Term Goal Medication review performed; medication list updated in electronic medical record.  Provider established cholesterol goals reviewed Counseled on importance of regular laboratory monitoring as prescribed Reviewed importance of limiting foods high in cholesterol Screening for signs and symptoms of depression related to chronic disease state Assessed social determinant of health barriers   Hypertension Interventions:  (Status:  New goal.) Long Term Goal Last practice recorded BP readings:  BP Readings from Last 3 Encounters:           10/09/22         115/69 11/08/22:        140/68 01/06/23         100/64  Most recent eGFR/CrCl:  Lab Results  Component Value Date   EGFR 68 12/19/2020    No components found for: "CRCL"  Evaluation of current treatment plan related to hypertension self management and patient's adherence to plan as established by provider Reviewed medications with patient and discussed importance of compliance Discussed plans with patient for ongoing care management follow up and provided patient with direct contact information for care management team Advised patient, providing education and rationale, to monitor blood pressure daily and record, calling PCP for findings outside established parameters Reviewed scheduled/upcoming provider appointments including:  Screening for signs and symptoms of depression related to chronic disease state  Assessed social determinant of health barriers  Pain Interventions:  (Status:  New goal.) Long Term  Goal Pain assessment performed Medications reviewed Reviewed provider established plan for pain management Discussed importance of adherence to all scheduled medical appointments Counseled on the importance of reporting any/all new or changed pain symptoms or management strategies to pain management provider Advised patient to report to care team affect of pain on daily activities Reviewed with patient prescribed pharmacological and nonpharmacological pain relief strategies Screening for signs and symptoms of depression related to chronic disease state  Assessed social determinant of health barriers   Self Care Activities: Over the next 90 days, patient will:  -Self administers medications as prescribed Attends all scheduled provider appointments Calls pharmacy for medication refills Calls  provider office for new concerns or questions Patient will locate meter to check blood sugars-completed Schedule eye provider appt.-completed Follow up with provider regarding blood sugars-completed  Follow Up Plan: The Managed Medicaid care management team will reach out to the patient again over the next 30  business days.  The patient has been provided with contact information for the Managed Medicaid care management team and has been advised to call with any health related questions or concerns.    Follow Up:  Patient agrees to Care Plan and Follow-up.  Plan: The Managed Medicaid care management team will reach out to the patient again over the next 30 business  days. and The  Patient has been provided with contact information for the Managed Medicaid care management team and has been advised to call with any health related questions or concerns.  Date/time of next scheduled RN care management/care coordination outreach: 03/25/23 at 1030

## 2023-02-17 ENCOUNTER — Ambulatory Visit: Payer: Medicaid Other | Admitting: Surgery

## 2023-02-17 DIAGNOSIS — R269 Unspecified abnormalities of gait and mobility: Secondary | ICD-10-CM | POA: Diagnosis not present

## 2023-02-18 ENCOUNTER — Other Ambulatory Visit: Payer: Medicaid Other | Admitting: Obstetrics and Gynecology

## 2023-02-18 ENCOUNTER — Encounter: Payer: Self-pay | Admitting: Obstetrics and Gynecology

## 2023-02-18 DIAGNOSIS — R269 Unspecified abnormalities of gait and mobility: Secondary | ICD-10-CM | POA: Diagnosis not present

## 2023-02-18 DIAGNOSIS — N3281 Overactive bladder: Secondary | ICD-10-CM | POA: Diagnosis not present

## 2023-02-18 DIAGNOSIS — I10 Essential (primary) hypertension: Secondary | ICD-10-CM | POA: Diagnosis not present

## 2023-02-18 NOTE — Patient Outreach (Signed)
Care Coordination  02/18/2023  Melinda [name] Nov 03, 1965 [id number]  RNCM received call from patient stating she has therapy appointment tomorrow with new provider, patient wanted to provide update.  [name], BSN [place]  [employer] - Managed Medicaid High Risk [phone]

## 2023-02-19 DIAGNOSIS — R269 Unspecified abnormalities of gait and mobility: Secondary | ICD-10-CM | POA: Diagnosis not present

## 2023-02-20 DIAGNOSIS — F341 Dysthymic disorder: Secondary | ICD-10-CM | POA: Diagnosis not present

## 2023-02-20 DIAGNOSIS — R269 Unspecified abnormalities of gait and mobility: Secondary | ICD-10-CM | POA: Diagnosis not present

## 2023-02-21 DIAGNOSIS — R269 Unspecified abnormalities of gait and mobility: Secondary | ICD-10-CM | POA: Diagnosis not present

## 2023-02-24 ENCOUNTER — Encounter: Payer: Self-pay | Admitting: Surgery

## 2023-02-24 ENCOUNTER — Other Ambulatory Visit: Payer: Self-pay

## 2023-02-24 ENCOUNTER — Encounter (HOSPITAL_COMMUNITY): Payer: Self-pay | Admitting: Emergency Medicine

## 2023-02-24 ENCOUNTER — Emergency Department (HOSPITAL_COMMUNITY): Payer: Medicaid Other

## 2023-02-24 ENCOUNTER — Ambulatory Visit (INDEPENDENT_AMBULATORY_CARE_PROVIDER_SITE_OTHER): Payer: Medicaid Other | Admitting: Surgery

## 2023-02-24 ENCOUNTER — Emergency Department (HOSPITAL_COMMUNITY)
Admission: EM | Admit: 2023-02-24 | Discharge: 2023-02-24 | Disposition: A | Discharge status: Home / Self Care | Payer: Medicaid Other | Source: Home / Self Care | Attending: Emergency Medicine | Admitting: Emergency Medicine

## 2023-02-24 VITALS — BP 133/85 | HR 94 | Temp 98.0°F | Ht 70.0 in | Wt 307.0 lb

## 2023-02-24 DIAGNOSIS — J189 Pneumonia, unspecified organism: Secondary | ICD-10-CM | POA: Diagnosis not present

## 2023-02-24 DIAGNOSIS — I10 Essential (primary) hypertension: Secondary | ICD-10-CM | POA: Diagnosis not present

## 2023-02-24 DIAGNOSIS — R110 Nausea: Secondary | ICD-10-CM | POA: Diagnosis not present

## 2023-02-24 DIAGNOSIS — R109 Unspecified abdominal pain: Secondary | ICD-10-CM | POA: Diagnosis present

## 2023-02-24 DIAGNOSIS — Z79899 Other long term (current) drug therapy: Secondary | ICD-10-CM | POA: Insufficient documentation

## 2023-02-24 DIAGNOSIS — R918 Other nonspecific abnormal finding of lung field: Secondary | ICD-10-CM | POA: Diagnosis not present

## 2023-02-24 DIAGNOSIS — I2510 Atherosclerotic heart disease of native coronary artery without angina pectoris: Secondary | ICD-10-CM | POA: Diagnosis not present

## 2023-02-24 DIAGNOSIS — E119 Type 2 diabetes mellitus without complications: Secondary | ICD-10-CM | POA: Insufficient documentation

## 2023-02-24 DIAGNOSIS — R1032 Left lower quadrant pain: Secondary | ICD-10-CM | POA: Diagnosis not present

## 2023-02-24 DIAGNOSIS — D1779 Benign lipomatous neoplasm of other sites: Secondary | ICD-10-CM

## 2023-02-24 DIAGNOSIS — Z7901 Long term (current) use of anticoagulants: Secondary | ICD-10-CM | POA: Diagnosis not present

## 2023-02-24 DIAGNOSIS — D259 Leiomyoma of uterus, unspecified: Secondary | ICD-10-CM | POA: Diagnosis not present

## 2023-02-24 DIAGNOSIS — J449 Chronic obstructive pulmonary disease, unspecified: Secondary | ICD-10-CM | POA: Insufficient documentation

## 2023-02-24 DIAGNOSIS — R269 Unspecified abnormalities of gait and mobility: Secondary | ICD-10-CM | POA: Diagnosis not present

## 2023-02-24 DIAGNOSIS — J181 Lobar pneumonia, unspecified organism: Secondary | ICD-10-CM | POA: Insufficient documentation

## 2023-02-24 DIAGNOSIS — R1084 Generalized abdominal pain: Secondary | ICD-10-CM | POA: Diagnosis not present

## 2023-02-24 DIAGNOSIS — N23 Unspecified renal colic: Secondary | ICD-10-CM | POA: Diagnosis not present

## 2023-02-24 LAB — URINALYSIS, ROUTINE W REFLEX MICROSCOPIC
Bacteria, UA: NONE SEEN
Bilirubin Urine: NEGATIVE
Glucose, UA: NEGATIVE mg/dL
Ketones, ur: NEGATIVE mg/dL
Leukocytes,Ua: NEGATIVE
Nitrite: NEGATIVE
Protein, ur: NEGATIVE mg/dL
RBC / HPF: >50 RBC/hpf (ref 0–5)
Specific Gravity, Urine: >1.046 — ABNORMAL HIGH (ref 1.005–1.030)
pH: 6 (ref 5.0–8.0)

## 2023-02-24 LAB — CBC WITH DIFFERENTIAL/PLATELET
Abs Immature Granulocytes: 0.06 10*3/uL (ref 0.00–0.07)
Basophils Absolute: 0.1 10*3/uL (ref 0.0–0.1)
Basophils Relative: 1 %
Eosinophils Absolute: 0.2 10*3/uL (ref 0.0–0.5)
Eosinophils Relative: 2 %
HCT: 41.7 % (ref 36.0–46.0)
Hemoglobin: 13.3 g/dL (ref 12.0–15.0)
Immature Granulocytes: 1 %
Lymphocytes Relative: 19 %
Lymphs Abs: 1.9 10*3/uL (ref 0.7–4.0)
MCH: 29.4 pg (ref 26.0–34.0)
MCHC: 31.9 g/dL (ref 30.0–36.0)
MCV: 92.3 fL (ref 80.0–100.0)
Monocytes Absolute: 0.6 10*3/uL (ref 0.1–1.0)
Monocytes Relative: 6 %
Neutro Abs: 7.1 10*3/uL (ref 1.7–7.7)
Neutrophils Relative %: 71 %
Platelets: 219 10*3/uL (ref 150–400)
RBC: 4.52 MIL/uL (ref 3.87–5.11)
RDW: 14.3 % (ref 11.5–15.5)
WBC: 9.9 10*3/uL (ref 4.0–10.5)
nRBC: 0 % (ref 0.0–0.2)

## 2023-02-24 LAB — COMPREHENSIVE METABOLIC PANEL
ALT: 25 U/L (ref 0–44)
AST: 20 U/L (ref 15–41)
Albumin: 3.2 g/dL — ABNORMAL LOW (ref 3.5–5.0)
Alkaline Phosphatase: 104 U/L (ref 38–126)
Anion gap: 8 (ref 5–15)
BUN: 8 mg/dL (ref 6–20)
CO2: 21 mmol/L — ABNORMAL LOW (ref 22–32)
Calcium: 8.3 mg/dL — ABNORMAL LOW (ref 8.9–10.3)
Chloride: 112 mmol/L — ABNORMAL HIGH (ref 98–111)
Creatinine, Ser: 0.97 mg/dL (ref 0.44–1.00)
GFR, Estimated: >60 mL/min (ref >60)
Glucose, Bld: 176 mg/dL — ABNORMAL HIGH (ref 70–99)
Potassium: 3.2 mmol/L — ABNORMAL LOW (ref 3.5–5.1)
Sodium: 141 mmol/L (ref 135–145)
Total Bilirubin: 0.2 mg/dL — ABNORMAL LOW (ref 0.3–1.2)
Total Protein: 6.3 g/dL — ABNORMAL LOW (ref 6.5–8.1)

## 2023-02-24 LAB — LIPASE, BLOOD: Lipase: 24 U/L (ref 11–51)

## 2023-02-24 LAB — D-DIMER, QUANTITATIVE: D-Dimer, Quant: <0.27 ug{FEU}/mL (ref 0.00–0.50)

## 2023-02-24 MED ORDER — DOXYCYCLINE HYCLATE 100 MG PO CAPS: 100.0000 mg | ORAL_CAPSULE | Freq: Two times a day (BID) | ORAL | 0 refills | Status: DC

## 2023-02-24 MED ORDER — LACTATED RINGERS IV BOLUS
1000.0000 mL | Freq: Once | INTRAVENOUS | Status: AC
  Administered 2023-02-24: 1000 mL via INTRAVENOUS

## 2023-02-24 MED ORDER — FENTANYL CITRATE PF 50 MCG/ML IJ SOSY
50.0000 ug | PREFILLED_SYRINGE | Freq: Once | INTRAMUSCULAR | Status: AC
  Administered 2023-02-24: 50 ug via INTRAVENOUS
  Filled 2023-02-24: qty 1

## 2023-02-24 MED ORDER — ONDANSETRON 4 MG PO TBDP: 4.0000 mg | ORAL_TABLET | Freq: Three times a day (TID) | ORAL | 0 refills | Status: DC | PRN

## 2023-02-24 MED ORDER — IBUPROFEN 800 MG PO TABS: 800.0000 mg | ORAL_TABLET | Freq: Three times a day (TID) | ORAL | 0 refills | Status: DC | PRN

## 2023-02-24 MED ORDER — IOHEXOL 350 MG/ML SOLN
100.0000 mL | Freq: Once | INTRAVENOUS | Status: AC | PRN
  Administered 2023-02-24: 100 mL via INTRAVENOUS

## 2023-02-24 NOTE — ED Notes (Addendum)
Pulse Ox at 98 while ambulating.

## 2023-02-24 NOTE — ED Notes (Signed)
Taxi voucher provided to patient.

## 2023-02-24 NOTE — Patient Instructions (Signed)
Follow up here in 1 month.  We will request the records from Dr Sharl Ma from when you were last seen.

## 2023-02-24 NOTE — ED Provider Notes (Signed)
[name] AT [facility] Provider Note   CSN: [id number] Arrival date & time: 02/24/23  0023     History  Chief Complaint  Patient presents with   Abdominal Pain   Back Pain    Melinda [name] is a 56 y.o. female.  Patient with a history of asthma, COPD, previous pulmonary embolism on Eliquis, diabetes, obesity, hypertension, brain aneurysm presents with left-sided abdominal pain that woke her from sleep about 1 hour ago.  States she felt fine going to bed.  She woke up with severe pain to her left side involving her upper abdomen and flank area.  Pain comes and goes in waves.  She is not take anything for it at home.  Associated with nausea but no vomiting.  No pain with urination or blood in the urine.  No fever.  No chest pain.  Shortness of breath at baseline.  No new cough.  No new back pain.  No focal weakness, numbness or tingling.  Previous cholecystectomy.  No history of kidney stones.  States compliance with her Eliquis.  Denies any change to her chronic shortness of breath.  Has never had this kind of pain in the past.  The history is provided by the patient.  Abdominal Pain Associated symptoms: nausea   Associated symptoms: no cough, no dysuria, no fever, no hematuria, no shortness of breath and no vomiting   Back Pain Associated symptoms: abdominal pain   Associated symptoms: no dysuria, no fever, no headaches and no weakness        Home Medications Prior to Admission medications   Medication Sig Start Date End Date Taking? Authorizing Provider  acetaminophen (TYLENOL) 500 MG tablet Take 1 tablet (500 mg total) by mouth every 6 (six) hours as needed. 07/20/21   Kerrin Champagne, MD  albuterol (PROVENTIL) (2.5 MG/3ML) 0.083% nebulizer solution Take 3 mLs (2.5 mg total) by nebulization every 6 (six) hours as needed for wheezing or shortness of breath. 12/19/20   [name], MD  albuterol (VENTOLIN HFA) 108 (90 Base) MCG/ACT inhaler  Inhale 2 puffs into the lungs every 6 (six) hours as needed for wheezing or shortness of breath. 12/19/20   [name], MD  apixaban (ELIQUIS) 5 MG TABS tablet TAKE ONE TABLET BY MOUTH TWICE DAILY MORNING AND EVENING 08/26/22   [name], MD  atorvastatin (LIPITOR) 20 MG tablet TAKE ONE TABLET BY MOUTH ONCE DAILY AT BEDTIME 05/30/22   [name], MD  budesonide-formoterol (SYMBICORT) 160-4.5 MCG/ACT inhaler Inhale 2 puffs into the lungs 2 (two) times daily. 08/21/21   [name], MD  buPROPion (WELLBUTRIN XL) 300 MG 24 hr tablet Take 300 mg by mouth daily.  09/12/17   [provider]  busPIRone (BUSPAR) 10 MG tablet Take 10 mg by mouth 2 (two) times daily.    [provider]  Cholecalciferol (VITAMIN D3) 10 MCG (400 UNIT) CAPS TAKE ONE CAPSULE BY MOUTH ONCE DAILY (AM) 08/26/22   [name], MD  cyclobenzaprine (FLEXERIL) 10 MG tablet TAKE 1 TABLET (10 MG TOTAL) BY MOUTH 3 (THREE) TIMES DAILY AS NEEDED FOR MUSCLE SPASMS. 04/04/22   [name], MD  doxepin (SINEQUAN) 50 MG capsule Take 150 mg by mouth at bedtime.    [provider]  Erenumab-aooe (AIMOVIG) 140 MG/ML SOAJ Inject 140 mg into the skin every 28 (twenty-eight) days. 01/06/23   Everlena Cooper, Adam R, DO  gabapentin (NEURONTIN) 300 MG capsule TAKE ONE CAPSULE BY MOUTH  THREE TIMES DAILY (AM+NOON+BEDTIME) 04/04/22   [name], MD  loratadine (CLARITIN) 10 MG tablet TAKE 1 TABLET (10 MG TOTAL) BY MOUTH DAILY (AM) 08/26/22   [name], MD  metoprolol tartrate (LOPRESSOR) 50 MG tablet TAKE ONE TABLET BY MOUTH TWICE DAILY (AM+BEDTIME) 08/26/22   Newlin, Odette Horns, MD  montelukast (SINGULAIR) 10 MG tablet TAKE 1 TABLET BY MOUTH EVERY EVENING AT BEDTIME 08/26/22   [name], MD  nystatin-triamcinolone ointment (MYCOLOG) Apply 1 Application topically 2 (two) times daily. Apply topically two times daily for five days 09/17/22   Terri Piedra, DO  OZEMPIC, 1 MG/DOSE, 4 MG/3ML SOPN  Inject 2 mg into the skin once a week. 02/21/22   [provider]  prazosin (MINIPRESS) 2 MG capsule Take 6 mg by mouth at bedtime.    [provider]  SYNTHROID 200 MCG tablet Take 1 tablet (200 mcg total) by mouth daily before breakfast. Patient taking differently: Take 274 mcg by mouth daily before breakfast. Takes 2 tablets in the morning 09/29/21   [name], MD  topiramate (TOPAMAX) 200 MG tablet Take 1 tablet (200 mg total) by mouth at bedtime. 01/06/23   [name], DO  traZODone (DESYREL) 50 MG tablet Take 50 mg by mouth at bedtime.    [provider]  VRAYLAR 1.5 MG capsule Take 1.5 mg by mouth daily. Taking 3.0 daily now 12/07/21   [provider]      Allergies    Ceftriaxone, Metformin, Penicillins, Shellfish allergy, Flonase [fluticasone propionate], Gold-containing drug products, Metformin hcl, Nickel, Penicillin g, and Citrus    Review of Systems   Review of Systems  Constitutional:  Negative for activity change, appetite change and fever.  HENT:  Negative for congestion and rhinorrhea.   Respiratory:  Negative for cough, chest tightness and shortness of breath.   Gastrointestinal:  Positive for abdominal pain and nausea. Negative for vomiting.  Genitourinary:  Negative for dysuria and hematuria.  Musculoskeletal:  Positive for back pain.  Skin:  Negative for rash.  Neurological:  Negative for dizziness, weakness and headaches.   all other systems are negative except as noted in the HPI and PMH.    Physical Exam Updated Vital Signs BP 99/70   Pulse 98   Temp 98.2 F (36.8 C)   Resp 17   SpO2 93%  Physical Exam Vitals and nursing note reviewed.  Constitutional:      General: She is not in acute distress.    Appearance: She is well-developed.  HENT:     Head: Normocephalic and atraumatic.     Mouth/Throat:     Pharynx: No oropharyngeal exudate.  Eyes:     Conjunctiva/sclera: Conjunctivae normal.     Pupils: Pupils are  equal, round, and reactive to light.  Neck:     Comments: No meningismus. Cardiovascular:     Rate and Rhythm: Normal rate and regular rhythm.     Heart sounds: Normal heart sounds. No murmur heard. Pulmonary:     Effort: Pulmonary effort is normal. No respiratory distress.     Breath sounds: Normal breath sounds.  Chest:     Chest wall: No tenderness.  Abdominal:     Palpations: Abdomen is soft.     Tenderness: There is no abdominal tenderness. There is no guarding or rebound.     Comments: TTP LUQ and LLQ. No guarding. Exam limited by body habitus  Musculoskeletal:        General: No tenderness. Normal  range of motion.     Cervical back: Normal range of motion and neck supple.     Comments: No CVAT  Skin:    General: Skin is warm.     Capillary Refill: Capillary refill takes less than 2 seconds.     Findings: No rash.  Neurological:     General: No focal deficit present.     Mental Status: She is alert and oriented to person, place, and time. Mental status is at baseline.     Cranial Nerves: No cranial nerve deficit.     Motor: No abnormal muscle tone.     Coordination: Coordination normal.     Comments:  5/5 strength throughout. CN 2-12 intact.Equal grip strength.   Psychiatric:        Behavior: Behavior normal.     ED Results / Procedures / Treatments   Labs (all labs ordered are listed, but only abnormal results are displayed) Labs Reviewed  COMPREHENSIVE METABOLIC PANEL - Abnormal; Notable for the following components:      Result Value   Potassium 3.2 (*)    Chloride 112 (*)    CO2 21 (*)    Glucose, Bld 176 (*)    Calcium 8.3 (*)    Total Protein 6.3 (*)    Albumin 3.2 (*)    Total Bilirubin 0.2 (*)    All other components within normal limits  URINALYSIS, ROUTINE W REFLEX MICROSCOPIC - Abnormal; Notable for the following components:   Specific Gravity, Urine >1.046 (*)    Hgb urine dipstick LARGE (*)    All other components within normal limits  CBC  WITH DIFFERENTIAL/PLATELET  LIPASE, BLOOD  D-DIMER, QUANTITATIVE    EKG None  Radiology CT Angio Chest PE W and/or Wo Contrast  Result Date: 02/24/2023 CLINICAL DATA:  Pulmonary embolism suspected positive D-dimer, also complains of left lower quadrant abdominal pain. EXAM: CT ANGIOGRAPHY CHEST CT ABDOMEN AND PELVIS WITH CONTRAST TECHNIQUE: Multidetector CT imaging of the chest was performed using the standard protocol during bolus administration of intravenous contrast. Multiplanar CT image reconstructions and MIPs were obtained to evaluate the vascular anatomy. Multidetector CT imaging of the abdomen and pelvis was performed using the standard protocol during bolus administration of intravenous contrast. RADIATION DOSE REDUCTION: This exam was performed according to the departmental dose-optimization program which includes automated exposure control, adjustment of the mA and/or kV according to patient size and/or use of iterative reconstruction technique. CONTRAST:  OMNIPAQUE IOHEXOL 350 MG/ML SOLN COMPARISON:  Portable chest 10/11/2022, portable chest 02/01/2022, CTA chest 12/30/2019, CT abdomen pelvis 10/11/2022 with contrast, and CT abdomen and pelvis with contrast 02/01/2022 FINDINGS: CTA CHEST FINDINGS Cardiovascular: Pulmonary arteries are normal caliber, without evidence of embolic filling defects. Pulmonary veins are nondistended. The heart is slightly enlarged. There is no pericardial effusion. Mild scattered two-vessel calcific plaques in the LAD, circumflex coronary arteries. The great vessels branch normally and are clear. The aorta is normal caliber and course with mild calcific plaques in the arch. Mediastinum/Nodes: Thyroidectomy. Axillary spaces are clear. A slightly prominent arch left periaortic mediastinal node is unchanged in the interval. Interval increased prominence noted of right mid hilar nodal complex up to 1.5 cm in short axis. Additional new precarinal lymph node right  of midline is 1.3 cm in short axis. No further adenopathy is seen. Thoracic esophagus is unremarkable. The trachea and main bronchi are smaller caliber compared to the prior study which could be from expiration or bronchospasm versus tracheobronchomalacia. Lungs/Pleura: Stable left  upper lobe bleb. Diffuse bronchial thickening. Scattered small branch bronchial plugging in the basal lower lobes. Mosaicism is again noted in all lobes, greatest in the lower lobes findings consistent with air trapping and small airways disease but there is denser peribronchovascular right lower lobe infrahilar ground-glass disease concerning for bronchopneumonia. Clinical correlation and follow-up recommended. No other focal infiltrate is suspected. There are posterior hypostatic changes which have been seen previously. No nodules. No pleural effusion or pneumothorax. Musculoskeletal: There are degenerative changes and mild dextroscoliosis of the thoracic spine. No acute or other significant thoracic osseous findings. The ribcage is intact. Review of the MIP images confirms the above findings. CT ABDOMEN and PELVIS FINDINGS Hepatobiliary: Borderline cirrhotic appearance and configuration of the liver. Stable prominence in the hepatic portal vein measuring 1.5 cm. No hepatic mass enhancement. Status post cholecystectomy with no biliary dilatation. Pancreas: No abnormality. Spleen: No abnormality.  No splenomegaly. 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 No left adrenal mass. No focal renal abnormality is seen. There is no nephrolithiasis or urinary obstruction. In the bladder, there is a 1 mm stone left of the midline, age-indeterminate but was not seen on either prior study. Possible this could have recently passed but uncertain. The bladder is otherwise unremarkable for the degree of distention. Perinephric stranding is similar to the prior studies. No findings of  pyelonephritis. Stomach/Bowel: Unremarkable stomach and small bowel. Normal appendix. Mild fecal stasis. There is colonic diverticulosis without evidence of diverticulitis. A small focal fat necrosis is again noted lateral to the junction of the descending and sigmoid colon and was seen on both prior studies. Vascular/Lymphatic: Aortic atherosclerosis. No enlarged abdominal or pelvic lymph nodes. Reproductive: Slightly prominent lobular fibroid uterus with fibroid calcifications on the left. No adnexal mass is seen. Other: Small umbilical and inguinal fat hernias. No incarcerated hernia. No free hemorrhage, free air or free fluid. Musculoskeletal: Degenerative disc disease and marginal osteophytosis L5-S1. No acute or significant osseous findings. L5-S1 posterior disc osteophyte complex again causes moderate spinal canal stenosis. Review of the MIP images confirms the above findings. IMPRESSION: 1. No evidence of arterial dilatation or embolus. Slight cardiomegaly. 2. Aortic and coronary artery atherosclerosis. 3. Diffuse bronchial thickening with scattered small branch bronchial plugging in the basal lower lobes. 4. 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 5. More dense peribronchovascular ground-glass disease in the right lower lobe infrahilar region concerning for bronchopneumonia. 6. Interval increased prominence of right hilar and precarinal lymph nodes, probably reactive. Follow-up as indicated. 7. Decreased caliber of the trachea and main bronchi which could be due to expiration or bronchospasm versus tracheobronchomalacia. 8. 1 mm stone in the bladder left of the midline, age-indeterminate but was not seen on either prior study. Possible this could have recently passed but uncertain. No nephrolithiasis or urinary obstruction. 9. Borderline cirrhotic appearance of the liver with stable prominence of the hepatic portal vein. No splenomegaly or ascites. 10. Stable 6.1 cm  right adrenal myelolipoma. 11. Constipation and diverticulosis. 12. Fibroid uterus. 13. Small umbilical and inguinal fat hernias. 14. Degenerative disc disease L5-S1 with moderate spinal canal stenosis. Aortic Atherosclerosis (ICD10-I70.0). Electronically Signed   By: Almira Bar M.D.   On: 02/24/2023 03:00   CT ABDOMEN PELVIS W CONTRAST  Result Date: 02/24/2023 CLINICAL DATA:  Pulmonary embolism suspected positive D-dimer, also complains of left lower quadrant abdominal pain. EXAM: CT ANGIOGRAPHY CHEST CT ABDOMEN AND PELVIS WITH CONTRAST TECHNIQUE: Multidetector CT imaging of  the chest was performed using the standard protocol during bolus administration of intravenous contrast. Multiplanar CT image reconstructions and MIPs were obtained to evaluate the vascular anatomy. Multidetector CT imaging of the abdomen and pelvis was performed using the standard protocol during bolus administration of intravenous contrast. RADIATION DOSE REDUCTION: This exam was performed according to the departmental dose-optimization program which includes automated exposure control, adjustment of the mA and/or kV according to patient size and/or use of iterative reconstruction technique. CONTRAST:  OMNIPAQUE IOHEXOL 350 MG/ML SOLN COMPARISON:  Portable chest 10/11/2022, portable chest 02/01/2022, CTA chest 12/30/2019, CT abdomen pelvis 10/11/2022 with contrast, and CT abdomen and pelvis with contrast 02/01/2022 FINDINGS: CTA CHEST FINDINGS Cardiovascular: Pulmonary arteries are normal caliber, without evidence of embolic filling defects. Pulmonary veins are nondistended. The heart is slightly enlarged. There is no pericardial effusion. Mild scattered two-vessel calcific plaques in the LAD, circumflex coronary arteries. The great vessels branch normally and are clear. The aorta is normal caliber and course with mild calcific plaques in the arch. Mediastinum/Nodes: Thyroidectomy. Axillary spaces are clear. A slightly prominent  arch left periaortic mediastinal node is unchanged in the interval. Interval increased prominence noted of right mid hilar nodal complex up to 1.5 cm in short axis. Additional new precarinal lymph node right of midline is 1.3 cm in short axis. No further adenopathy is seen. Thoracic esophagus is unremarkable. The trachea and main bronchi are smaller caliber compared to the prior study which could be from expiration or bronchospasm versus tracheobronchomalacia. Lungs/Pleura: Stable left upper lobe bleb. Diffuse bronchial thickening. Scattered small branch bronchial plugging in the basal lower lobes. Mosaicism is again noted in all lobes, greatest in the lower lobes findings consistent with air trapping and small airways disease but there is denser peribronchovascular right lower lobe infrahilar ground-glass disease concerning for bronchopneumonia. Clinical correlation and follow-up recommended. No other focal infiltrate is suspected. There are posterior hypostatic changes which have been seen previously. No nodules. No pleural effusion or pneumothorax. Musculoskeletal: There are degenerative changes and mild dextroscoliosis of the thoracic spine. No acute or other significant thoracic osseous findings. The ribcage is intact. Review of the MIP images confirms the above findings. CT ABDOMEN and PELVIS FINDINGS Hepatobiliary: Borderline cirrhotic appearance and configuration of the liver. Stable prominence in the hepatic portal vein measuring 1.5 cm. No hepatic mass enhancement. Status post cholecystectomy with no biliary dilatation. Pancreas: No abnormality. Spleen: No abnormality.  No splenomegaly. 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 No left adrenal mass. No focal renal abnormality is seen. There is no nephrolithiasis or urinary obstruction. In the bladder, there is a 1 mm stone left of the midline, age-indeterminate but was not seen on either  prior study. Possible this could have recently passed but uncertain. The bladder is otherwise unremarkable for the degree of distention. Perinephric stranding is similar to the prior studies. No findings of pyelonephritis. Stomach/Bowel: Unremarkable stomach and small bowel. Normal appendix. Mild fecal stasis. There is colonic diverticulosis without evidence of diverticulitis. A small focal fat necrosis is again noted lateral to the junction of the descending and sigmoid colon and was seen on both prior studies. Vascular/Lymphatic: Aortic atherosclerosis. No enlarged abdominal or pelvic lymph nodes. Reproductive: Slightly prominent lobular fibroid uterus with fibroid calcifications on the left. No adnexal mass is seen. Other: Small umbilical and inguinal fat hernias. No incarcerated hernia. No free hemorrhage, free air or free fluid. Musculoskeletal: Degenerative disc disease and marginal osteophytosis L5-S1. No acute or  significant osseous findings. L5-S1 posterior disc osteophyte complex again causes moderate spinal canal stenosis. Review of the MIP images confirms the above findings. IMPRESSION: 1. No evidence of arterial dilatation or embolus. Slight cardiomegaly. 2. Aortic and coronary artery atherosclerosis. 3. Diffuse bronchial thickening with scattered small branch bronchial plugging in the basal lower lobes. 4. 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 5. More dense peribronchovascular ground-glass disease in the right lower lobe infrahilar region concerning for bronchopneumonia. 6. Interval increased prominence of right hilar and precarinal lymph nodes, probably reactive. Follow-up as indicated. 7. Decreased caliber of the trachea and main bronchi which could be due to expiration or bronchospasm versus tracheobronchomalacia. 8. 1 mm stone in the bladder left of the midline, age-indeterminate but was not seen on either prior study. Possible this could have recently  passed but uncertain. No nephrolithiasis or urinary obstruction. 9. Borderline cirrhotic appearance of the liver with stable prominence of the hepatic portal vein. No splenomegaly or ascites. 10. 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 11. Constipation and diverticulosis. 12. Fibroid uterus. 13. Small umbilical and inguinal fat hernias. 14. Degenerative disc disease L5-S1 with moderate spinal canal stenosis. Aortic Atherosclerosis (ICD10-I70.0). Electronically Signed   By: Almira Bar M.D.   On: 02/24/2023 03:00    Procedures Procedures    Medications Ordered in ED Medications  fentaNYL (SUBLIMAZE) injection 50 mcg (has no administration in time range)  lactated ringers bolus 1,000 mL (has no administration in time range)    ED Course/ Medical Decision Making/ A&P                                 Medical Decision Making Amount and/or Complexity of Data Reviewed Independent Historian: EMS Labs: ordered. Decision-making details documented in ED Course. Radiology: ordered and independent interpretation performed. Decision-making details documented in ED Course. ECG/medicine tests: ordered and independent interpretation performed. Decision-making details documented in ED Course.  Risk Prescription drug management.   Left-sided abdominal pain that woke her from sleep associated with nausea.  Vitals are stable.  No distress.  Consider kidney stone, UTI, diverticulitis.  Will obtain labs, urinalysis and imaging.  Low suspicion for PE as she states compliance with her Eliquis.  Renal function appears to be at baseline.  Hemoglobin is stable.  EKG is sinus rhythm without acute ST changes.  Imaging remarkable for stone in bladder which is likely source of her flank pain. Also questionable right lower lobe infiltrate is not consistent with the area of pain.  She has no hypoxia, shortness of breath or fever.  Urinalysis positive for blood but negative for infection.  Suspect her pain is  likely secondary to passed kidney stone.  Work up otherwise reassuring.  Patient able to ambulate without desaturation.  No evidence of pulmonary embolism.  Will treat empirically for possible pneumonia.  She is not hypoxic or septic or tachycardic.  Discussed follow-up with PCP as well as urology.  Return to the ED for worsening pain, fever, vomiting, not able to urinate or any other concerns.       Final Clinical Impression(s) / ED Diagnoses Final diagnoses:  Flank pain  Ureteral colic  Community acquired pneumonia of right lower lobe of lung    Rx / DC Orders ED Discharge Orders     None         Murtaza Shell, Jeannett Senior, MD 02/24/23 [phone]

## 2023-02-24 NOTE — Discharge Instructions (Signed)
It appears Your abdominal pain was from passing a kidney stone.  It is now in your bladder and you should be able to urinate it out without a problem.  Take the antibiotics for a possible pneumonia.  Follow-up with your primary doctor as well as the urologist.  Return to the ED with worsening pain, fever, vomiting, not able to urinate or other concerns.

## 2023-02-24 NOTE — ED Triage Notes (Signed)
BIBA Per EMS: Per EMS: pt c/o LLQ abd pain radiating to back. VSS

## 2023-02-25 DIAGNOSIS — R269 Unspecified abnormalities of gait and mobility: Secondary | ICD-10-CM | POA: Diagnosis not present

## 2023-02-26 DIAGNOSIS — R269 Unspecified abnormalities of gait and mobility: Secondary | ICD-10-CM | POA: Diagnosis not present

## 2023-02-26 NOTE — Progress Notes (Unsigned)
Outpatient Surgical Follow Up  02/26/2023  Melinda [name] is an 57 y.o. female.   Chief Complaint  Patient presents with  . Follow-up    HPI: ***  Past Medical History:  Diagnosis Date  . ADHD (attention deficit hyperactivity disorder)   . Arthritis   . Asthma   . Cancer (HCC)    Thyroid  . Chicken pox AGE 89  . COPD (chronic obstructive pulmonary disease) (HCC)    pt reported  . Diabetes mellitus without complication (HCC)   . Diverticulosis, sigmoid   . GERD (gastroesophageal reflux disease)   . Glaucoma    BOTH EYES, NO EYE DROPS  . Glaucoma   . History of blood transfusion 08/2014    2UNITS GIVEN AND IRON GIVEN  . Hyperglycemia 09/09/2014  . Hypertension   . 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 Iron deficiency anemia due to chronic blood loss 09/09/2014  . Low TSH level 09/08/2014  . Lumbar herniated disc   . Menorrhagia 09/08/2014  . Migraine    CLUSTER AND MIGRAINES  . Multiple thyroid nodules   . Ovarian mass, right 09/09/2014  . PE (pulmonary embolism) 2016  . Peripheral neuropathy    FINGER TIPS AND TOES NUMB SOME  . Preeclampsia 1983   WITH PREGNANCY  . PTSD (post-traumatic stress disorder)   . Scoliosis   . Seizures (HCC) AGE 89   NONE SINCE, HAD CHICKEN POX THEN    Past Surgical History:  Procedure Laterality Date  . ANGIOGRAM TO LEG  08-13-15   RIGHT  . CHOLECYSTECTOMY    . IR GENERIC HISTORICAL  04/04/2016   IR RADIOLOGIST EVAL & MGMT 04/04/2016 Simonne Come, MD [facility] INTERV RAD  . RADIOLOGY WITH ANESTHESIA N/A 07/17/2021   Procedure: MRI LUMBAR SPINE WITHOUT CONTRAST; MRI CERVICAL SPINE WITHOUT CONTRAST WITH ANESTHESIA;  Surgeon: Radiologist, Medication, MD;  Location: [facility] OR;  Service: Radiology;  Laterality: N/A;  . THYROIDECTOMY N/A 11/17/2015   Procedure: TOTAL THYROIDECTOMY;  Surgeon: Darnell Level, MD;  Location: [facility] ORS;  Service: General;  Laterality: N/A;  . UTERINE  ARTERY EMBOLIZATION Bilateral 08/13/2015    Family History  Problem Relation Age of Onset  . Diabetes Mother   . Breast cancer Mother   . CAD Mother   . Hypertension Mother   . Alcohol abuse Father   . Hypertension Father   . Breast cancer Maternal Aunt   . Breast cancer Maternal Aunt   . Kidney nephrosis Son   . Diabetes Son     Social History:  reports that she quit smoking about 8 years ago. Her smoking use included cigarettes. She started smoking about 48 years ago. She has a 20 pack-year smoking history. She has been exposed to tobacco smoke. She has never used smokeless tobacco. She reports that she does not drink alcohol and does not use drugs.  Allergies:  Allergies  Allergen Reactions  . Ceftriaxone Anaphylaxis and Other (See Comments)    ROCEPHIN  . Metformin Anaphylaxis    ALL  . Penicillins Shortness Of Breath    Has patient had a PCN reaction causing immediate rash, facial/tongue/throat swelling, SOB or lightheadedness with hypotension: Yes Has patient had a PCN reaction causing severe rash involving mucus membranes or skin necrosis: No Has patient had a PCN reaction that required hospitalization No Has patient had a PCN reaction occurring within the last 10 years: No If all  of the above answers are "NO", then may proceed with Cephalosporin use.   . Shellfish Allergy Anaphylaxis  . Flonase [Fluticasone Propionate]     Makes migraines worse  . Gold-Containing Drug Products     HANDS ITCH  . Metformin Hcl Other (See Comments)  . Nickel     HANDS SWELL  . Penicillin G Other (See Comments)  . Citrus Rash    Medications reviewed.    ROS Full ROS performed and is otherwise negative other than what is stated in HPI   BP 133/85   Pulse 94   Temp 98 F (36.7 C)   Ht 5\' 10"  (1.778 m)   Wt (!) 307 lb (139.3 kg)   SpO2 95%   BMI 44.05 kg/m   Physical Exam     Assessment/Plan:    Sterling Big, MD FACS General Surgeon

## 2023-02-27 DIAGNOSIS — R748 Abnormal levels of other serum enzymes: Secondary | ICD-10-CM | POA: Diagnosis not present

## 2023-02-27 DIAGNOSIS — R269 Unspecified abnormalities of gait and mobility: Secondary | ICD-10-CM | POA: Diagnosis not present

## 2023-02-28 ENCOUNTER — Other Ambulatory Visit: Payer: Medicaid Other | Admitting: Licensed Clinical Social Worker

## 2023-02-28 NOTE — Patient Instructions (Signed)
Melinda [name] ,   [facility] Managed Care Team is available to provide assistance to you with your healthcare needs at no cost and as a benefit of your [facility] Health plan. I'm sorry I was unable to reach you today for our scheduled appointment. Our care guide will call you to reschedule our telephone appointment. Please call me at the number below. I am available to be of assistance to you regarding your healthcare needs. .   Thank you,   [name], BSW, MSW, LCSW Managed Medicaid LCSW [facility]  [address] [name] Phone: [phone]

## 2023-02-28 NOTE — Patient Outreach (Signed)
  Medicaid Managed Care   Unsuccessful Attempt Note   02/28/2023 Name: Melinda [name] MRN: [id number] DOB: 04/01/1966  Referred by: Collene Mares, PA Reason for referral : No chief complaint on file.   A second unsuccessful telephone outreach was attempted today. The patient was referred to the case management team for assistance with care management and care coordination.    Follow Up Plan: A HIPAA compliant phone message was left for the patient providing contact information and requesting a return call. and The Managed Medicaid care management team will reach out to the patient again over the next 30 days.   [name], BSW, MSW, [name] Managed Medicaid LCSW [facility]  [facility] [email or web address] Phone: [phone]

## 2023-03-02 DIAGNOSIS — G4733 Obstructive sleep apnea (adult) (pediatric): Secondary | ICD-10-CM | POA: Diagnosis not present

## 2023-03-03 DIAGNOSIS — R269 Unspecified abnormalities of gait and mobility: Secondary | ICD-10-CM | POA: Diagnosis not present

## 2023-03-04 DIAGNOSIS — R269 Unspecified abnormalities of gait and mobility: Secondary | ICD-10-CM | POA: Diagnosis not present

## 2023-03-05 DIAGNOSIS — R269 Unspecified abnormalities of gait and mobility: Secondary | ICD-10-CM | POA: Diagnosis not present

## 2023-03-06 DIAGNOSIS — R269 Unspecified abnormalities of gait and mobility: Secondary | ICD-10-CM | POA: Diagnosis not present

## 2023-03-07 DIAGNOSIS — R269 Unspecified abnormalities of gait and mobility: Secondary | ICD-10-CM | POA: Diagnosis not present

## 2023-03-10 DIAGNOSIS — R269 Unspecified abnormalities of gait and mobility: Secondary | ICD-10-CM | POA: Diagnosis not present

## 2023-03-11 DIAGNOSIS — R269 Unspecified abnormalities of gait and mobility: Secondary | ICD-10-CM | POA: Diagnosis not present

## 2023-03-12 DIAGNOSIS — R269 Unspecified abnormalities of gait and mobility: Secondary | ICD-10-CM | POA: Diagnosis not present

## 2023-03-13 DIAGNOSIS — G4733 Obstructive sleep apnea (adult) (pediatric): Secondary | ICD-10-CM | POA: Diagnosis not present

## 2023-03-13 DIAGNOSIS — R269 Unspecified abnormalities of gait and mobility: Secondary | ICD-10-CM | POA: Diagnosis not present

## 2023-03-14 DIAGNOSIS — R269 Unspecified abnormalities of gait and mobility: Secondary | ICD-10-CM | POA: Diagnosis not present

## 2023-03-17 DIAGNOSIS — R269 Unspecified abnormalities of gait and mobility: Secondary | ICD-10-CM | POA: Diagnosis not present

## 2023-03-18 ENCOUNTER — Other Ambulatory Visit: Payer: Medicaid Other | Admitting: Licensed Clinical Social Worker

## 2023-03-18 DIAGNOSIS — R269 Unspecified abnormalities of gait and mobility: Secondary | ICD-10-CM | POA: Diagnosis not present

## 2023-03-18 DIAGNOSIS — F4325 Adjustment disorder with mixed disturbance of emotions and conduct: Secondary | ICD-10-CM | POA: Diagnosis not present

## 2023-03-18 NOTE — Patient Outreach (Signed)
Medicaid Managed Care Social Work Note  03/18/2023 Name:  Melinda [name] MRN:  [id number] DOB:  May 19, 1966  Melinda [name] is an 57 y.o. year old female who is a primary patient of [place], [place], [place].  The Medicaid Managed Care Coordination team was consulted for assistance with:  Mental Health Counseling and Resources  Ms. Shevchuk was given information about Medicaid Managed Care Coordination team services today. Melinda [name] Patient agreed to services and verbal consent obtained.  Engaged with patient  for by telephone forfollow up visit in response to referral for case management and/or care coordination services.   Assessments/Interventions:  Review of past medical history, allergies, medications, health status, including review of consultants reports, laboratory and other test data, was performed as part of comprehensive evaluation and provision of chronic care management services.  SDOH: (Social Determinant of Health) assessments and interventions performed: SDOH Interventions    Flowsheet Row Patient Outreach Telephone from 03/18/2023 in [place] POPULATION HEALTH DEPARTMENT Patient Outreach Telephone from 02/18/2023 in [place] POPULATION HEALTH DEPARTMENT Patient Outreach Telephone from 02/14/2023 in [place] POPULATION HEALTH DEPARTMENT Patient Outreach Telephone from 01/21/2023 in [place] POPULATION HEALTH DEPARTMENT Patient Outreach Telephone from 01/13/2023 in [place] POPULATION HEALTH DEPARTMENT Patient Outreach Telephone from 12/13/2022 in  POPULATION HEALTH DEPARTMENT  SDOH Interventions        Food Insecurity Interventions -- -- Intervention Not Indicated -- -- --  Housing Interventions Intervention Not Indicated -- -- -- -- --  Transportation Interventions -- -- -- -- -- Intervention Not Indicated  Alcohol Usage Interventions -- Intervention Not Indicated (Score <7) -- -- -- --  Depression Interventions/Treatment  -- -- Medication, Currently on  Treatment, Counseling  [in process of establishing with a new therapist/counselor due to insurance.] -- -- --  Physical Activity Interventions -- Intervention Not Indicated  [not able to perform this type of exercise] -- -- -- --  Stress Interventions Offered [employer], Provide Counseling -- -- Provide Counseling, Offered [employer] Resources Intervention Not Indicated --  Health Literacy Interventions -- -- -- -- Intervention Not Indicated --       Advanced Directives Status:  See Care Plan for related entries.  Care Plan                 Allergies  Allergen Reactions   Ceftriaxone Anaphylaxis and Other (See Comments)    ROCEPHIN   Metformin Anaphylaxis    ALL   Penicillins Shortness Of Breath    Has patient had a PCN reaction causing immediate rash, facial/tongue/throat swelling, SOB or lightheadedness with hypotension: Yes Has patient had a PCN reaction causing severe rash involving mucus membranes or skin necrosis: No Has patient had a PCN reaction that required hospitalization No Has patient had a PCN reaction occurring within the last 10 years: No If all of the above answers are "NO", then may proceed with Cephalosporin use.    Shellfish Allergy Anaphylaxis   Flonase [Fluticasone Propionate]     Makes migraines worse   Gold-Containing Drug Products     HANDS ITCH   Metformin Hcl Other (See Comments)   Nickel     HANDS SWELL   Penicillin G Other (See Comments)   Citrus Rash    Medications Reviewed Today   Medications were not reviewed in this encounter     Patient Active Problem List   Diagnosis Date Noted   Postoperative hypothyroidism 01/21/2022   Esophageal dysphagia 01/21/2022   History of DVT (  deep vein thrombosis) 01/21/2022   History of malignant neoplasm of thyroid 01/21/2022   Long term (current) use of anticoagulants 01/21/2022   Migraine without aura, not refractory 01/21/2022   Type 2 diabetes mellitus with hyperglycemia  (HCC) 01/21/2022   Spinal stenosis excluding cervical region 11/21/2021   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   Abdominal pannus 08/21/2021   Hardening of the aorta (main artery of the heart) (HCC) 08/21/2021   At high risk for falls 04/20/2020   Hyperlipidemia 02/16/2020   Adrenal mass, right (HCC) 04/29/2019   Chronic cough 03/12/2018   Left shoulder pain 10/14/2016   Primary hypertension 10/14/2016   S/P thyroidectomy 06/27/2016   Chronic bilateral low back pain without sciatica 04/22/2016   Liver lesion 12/24/2015   Malignant tumor of thyroid gland (HCC) 11/16/2015   Controlled type 2 diabetes mellitus with complication, with long-term current use of insulin (HCC) 10/10/2015   Vision loss 08/25/2015   Decreased vision in both eyes 08/25/2015   Prolonged Q-T interval on ECG 08/19/2015   History of pulmonary embolism 08/10/2015   Mild persistent asthma 04/13/2015   Nonruptured cerebral aneurysm 04/13/2015   Fibroid, uterine    Morbid obesity (HCC) 11/11/2014   Atypical chest pain 11/08/2014   Sinus tachycardia 11/08/2014   Migraine 10/14/2014   Menorrhagia 09/08/2014    Conditions to be addressed/monitored per PCP order:  Anxiety and Depression  Care Plan : LCSW plan of care  Updates made by [name], LCSW since 03/18/2023 12:00 AM     Problem: Therapy      Goal: Therapy Needs Identified   Start Date: 01/21/2023  Note:   Priority: High  Timeframe:  Short-Range Goal Priority:  High Start Date:   01/21/23          Expected End Date:  ongoing                     Follow Up Date--04/09/23 at 10 am.  - keep 90 percent of scheduled appointments- counseling and psychiatry -consider bumping up your self-care  -consider creating a stronger support network    Current barriers:   Mental Health needs related to depression/stress management. Patient requires Support, Education, Resources, Referrals, Advocacy, and Care Coordination, in order to meet Unmet  Mental Health Needs. Will lose current therapy at [facility] as of 01/30/23 Patient will implement clinical interventions discussed today to decrease symptoms of depression and increase knowledge and/or ability of: coping skills. Patient lacks knowledge of available community counseling agencies and resources.  Clinical Goal(s): verbalize understanding of plan for management of Stress and demonstrate a reduction in symptoms. Patient will connect with a provider for ongoing mental health treatment, increase coping skills, healthy habits, self-management skills, and stress reduction        Clinical Interventions:  Assessed patient's previous and current treatment, coping skills, support system and barriers to care. Patient provided hx  Verbalization of feelings encouraged, motivational interviewing employed Emotional support provided, positive coping strategies explored. As of 01/30/23 will no longer be able to keep seeing therapist at [facility] as they will not take [facility] Managed Medicaid anymore. Patient has a psychiatrist at [facility] and was advised to contact agency to question if she can start therapy there. Joint phone call made to [facility] and voice message left inquiring about warm transfer between therapist.  Establishing healthy boundaries emphasized and healthy self-care education provided Patient was educated on available mental health resources within their area that accept  Medicaid and offer counseling. Email sent to patient today with available mental health resources within her area that accept Medicaid and offer the services that she is interested in. Email included some crisis support resources and [facility]'s walk in clinic hours.  Patient will work on implementing appropriate self-care habits into their daily routine such as: staying positive, writing a gratitude list, drinking water, staying active around the house, taking their medications as prescribed,  combating negative thoughts or emotions and staying connected with their family and friends. Positive reinforcement provided for this decision to work on this. Motivational Interviewing employed Depression screen reviewed  PHQ2/ PHQ9 completed or reviewed  Mindfulness or Relaxation training provided Active listening / Reflection utilized  Advance Care and HCPOA education provided Emotional Support Provided Problem Solving /Task Center strategies reviewed Provided psychoeducation for mental health needs  Provided brief CBT  Reviewed mental health medications and discussed importance of compliance:  Quality of sleep assessed & Sleep Hygiene techniques promoted  Participation in counseling encouraged  Verbalization of feelings encouraged  Suicidal Ideation/Homicidal Ideation assessed: Patient denies SI/HI  Review resources, discussed options and provided patient information about  Mental Health Resources Inter-disciplinary care team collaboration (see longitudinal plan of care) Patient reports that she reviewed resources that [facility] LCSW sent her by email and called [facility] and reports that they will be able to take her for therapy mid August of 2024. She was advised to contact them in two weeks to follow up on this and to contact this [facility] LCSW directly if she needs any assistance with their enrollment process. Update-Patient reports that she did not feel quite comfortable with therapist at [facility] and discontinued enrollment into their program. Patient reports that she was able to connect with her previous counselor and was informed that they will be able to accept her back for long term therapy. [facility] LCSW will follow up next month to ensure patient was successfully connected to needed services. Brief self-care education provided.  Patient Goals/Self-Care Activities: Over the next 120 days Attend scheduled medical appointments Utilize healthy coping skills and supportive resources  discussed Contact PCP with any questions or concerns Keep 90 percent of counseling appointments Call your insurance provider for more information about your Enhanced Benefits  Check out counseling resources provided  Begin personal counseling with LCSW, to reduce and manage symptoms of Stress, until well-established with a new mental health therapist.  Incorporate into daily practice - relaxation techniques, deep breathing exercises, and mindfulness meditation strategies. Talk about feelings with friends, family members, spiritual advisor, etc. Contact LCSW directly [phone]), if you have questions, need assistance, or if additional social work needs are identified between now and our next scheduled telephone outreach call. Call 988 for mental health hotline/crisis line if needed (24/7 available) Try techniques to reduce symptoms of anxiety/negative thinking (deep breathing, distraction, positive self talk, etc)  - develop a personal safety plan - develop a plan to deal with triggers like holidays, anniversaries - exercise at least 2 to 3 times per week - have a plan for how to handle bad days - journal feelings and what helps to feel better or worse - spend time or talk with others at least 2 to 3 times per week - watch for early signs of feeling worse - begin personal counseling - call and visit an old friend - check out volunteer opportunities - join a support group - laugh; watch a funny movie or comedian - learn and use visualization or guided imagery -  perform a random act of kindness - practice relaxation or meditation daily - start or continue a personal journal - practice positive thinking and self-talk -continue with compliance of taking medication  -identify current effective and ineffective coping strategies.  -implement positive self-talk in care to increase self-esteem, confidence and feelings of control.  -consider alternative and complementary therapy approaches  such as meditation, mindfulness or yoga.  Call your insurance provider to gain education on benefits if desired Call your primary care doctor if symptoms get worse  -journaling, prayer, worship services, meditation or pastoral counseling.  -increase participation in pleasurable group activities such as hobbies, singing, sports or volunteering).  -consider the use of meditative movement therapy such as tai chi, yoga or qigong.  -start a regular daily exercise program based on tolerance, ability and patient choice to support positive thinking and activity    If you are experiencing a Mental Health or Behavioral Health Crisis or need someone to talk to, please call the Suicide and Crisis Lifeline: 988    Patient Goals: Follow up goal     Follow up:  Patient agrees to Care Plan and Follow-up.  Plan: The Managed Medicaid care management team will reach out to the patient again over the next 30 days.  [name], BSW, MSW, [name] Managed Medicaid LCSW [facility]  [facility] [email or web address] Phone: [phone]

## 2023-03-18 NOTE — Patient Instructions (Signed)
Visit Information  Melinda [name] was given information about Medicaid Managed Care team care coordination services as a part of their [facility] Community Plan Medicaid benefit. Melinda [name] verbally consented to engagement with the [facility] Managed Care team.   If you are experiencing a medical emergency, please call 911 or report to your local emergency department or urgent care.   If you have a non-emergency medical problem during routine business hours, please contact your provider's office and ask to speak with a nurse.   For questions related to your [facility], please call: [phone] or visit the homepage here: [email or web address]  If you would like to schedule transportation through your [facility], please call the following number at least 2 days in advance of your appointment: [phone]   Rides for urgent appointments can also be made after hours by calling Member Services.  Call the Behavioral Health Crisis Line at 316-838-9051, at any time, 24 hours a day, 7 days a week. If you are in danger or need immediate medical attention call 911.  If you would like help to quit smoking, call 1-800-QUIT-NOW ((430) 548-9386) OR Espaol: 1-855-Djelo-Ya (1-324-401-0272) o para ms informacin haga clic aqu or Text READY to 536-644 to register via text  Following is a copy of your plan of care:  Care Plan : LCSW plan of care  Updates made by Melinda Bryant, LCSW since 03/18/2023 12:00 AM     Problem: Therapy      Goal: Therapy Needs Identified   Start Date: 01/21/2023  Note:   Priority: High  Timeframe:  Short-Range Goal Priority:  High Start Date:   01/21/23          Expected End Date:  ongoing                     Follow Up Date--04/09/23 at 10 am.  - keep 90 percent of scheduled appointments- counseling and psychiatry -consider bumping up your self-care  -consider  creating a stronger support network    Current barriers:   Mental Health needs related to depression/stress management. Patient requires Support, Education, Resources, Referrals, Advocacy, and Care Coordination, in order to meet Unmet Mental Health Needs. Will lose current therapy at [facility] as of 01/30/23 Patient will implement clinical interventions discussed today to decrease symptoms of depression and increase knowledge and/or ability of: coping skills. Patient lacks knowledge of available community counseling agencies and resources.  Clinical Goal(s): verbalize understanding of plan for management of Stress and demonstrate a reduction in symptoms. Patient will connect with a provider for ongoing mental health treatment, increase coping skills, healthy habits, self-management skills, and stress reduction        Patient Goals/Self-Care Activities: Over the next 120 days Attend scheduled medical appointments Utilize healthy coping skills and supportive resources discussed Contact PCP with any questions or concerns Keep 90 percent of counseling appointments Call your insurance provider for more information about your Enhanced Benefits  Check out counseling resources provided  Begin personal counseling with LCSW, to reduce and manage symptoms of Stress, until well-established with a new mental health therapist.  Incorporate into daily practice - relaxation techniques, deep breathing exercises, and mindfulness meditation strategies. Talk about feelings with friends, family members, spiritual advisor, etc. Contact LCSW directly [phone]), if you have questions, need assistance, or if additional social work needs are identified between now and our next scheduled telephone outreach call. Call 988 for mental health hotline/crisis line if needed (  24/7 available) Try techniques to reduce symptoms of anxiety/negative thinking (deep breathing, distraction, positive self talk, etc)  - develop a  personal safety plan - develop a plan to deal with triggers like holidays, anniversaries - exercise at least 2 to 3 times per week - have a plan for how to handle bad days - journal feelings and what helps to feel better or worse - spend time or talk with others at least 2 to 3 times per week - watch for early signs of feeling worse - begin personal counseling - call and visit an old friend - check out volunteer opportunities - join a support group - laugh; watch a funny movie or comedian - learn and use visualization or guided imagery - perform a random act of kindness - practice relaxation or meditation daily - start or continue a personal journal - practice positive thinking and self-talk -continue with compliance of taking medication  -identify current effective and ineffective coping strategies.  -implement positive self-talk in care to increase self-esteem, confidence and feelings of control.  -consider alternative and complementary therapy approaches such as meditation, mindfulness or yoga.  Call your insurance provider to gain education on benefits if desired Call your primary care doctor if symptoms get worse  -journaling, prayer, worship services, meditation or pastoral counseling.  -increase participation in pleasurable group activities such as hobbies, singing, sports or volunteering).  -consider the use of meditative movement therapy such as tai chi, yoga or qigong.  -start a regular daily exercise program based on tolerance, ability and patient choice to support positive thinking and activity    If you are experiencing a Mental Health or Behavioral Health Crisis or need someone to talk to, please call the Suicide and Crisis Lifeline: 988    Patient Goals: Follow up goal  Melinda [name], BSW, MSW, [name] Managed Medicaid LCSW [facility]  [facility] [name] Phone: [phone]

## 2023-03-19 DIAGNOSIS — R269 Unspecified abnormalities of gait and mobility: Secondary | ICD-10-CM | POA: Diagnosis not present

## 2023-03-20 ENCOUNTER — Ambulatory Visit: Payer: Medicaid Other | Admitting: Podiatry

## 2023-03-20 ENCOUNTER — Ambulatory Visit: Payer: Medicaid Other | Admitting: Dermatology

## 2023-03-20 DIAGNOSIS — F411 Generalized anxiety disorder: Secondary | ICD-10-CM | POA: Diagnosis not present

## 2023-03-20 DIAGNOSIS — R269 Unspecified abnormalities of gait and mobility: Secondary | ICD-10-CM | POA: Diagnosis not present

## 2023-03-20 DIAGNOSIS — F331 Major depressive disorder, recurrent, moderate: Secondary | ICD-10-CM | POA: Diagnosis not present

## 2023-03-20 DIAGNOSIS — F4310 Post-traumatic stress disorder, unspecified: Secondary | ICD-10-CM | POA: Diagnosis not present

## 2023-03-21 ENCOUNTER — Ambulatory Visit: Payer: Medicaid Other | Admitting: Podiatry

## 2023-03-21 ENCOUNTER — Encounter: Payer: Self-pay | Admitting: Podiatry

## 2023-03-21 DIAGNOSIS — E118 Type 2 diabetes mellitus with unspecified complications: Secondary | ICD-10-CM

## 2023-03-21 DIAGNOSIS — M79675 Pain in left toe(s): Secondary | ICD-10-CM | POA: Diagnosis not present

## 2023-03-21 DIAGNOSIS — B351 Tinea unguium: Secondary | ICD-10-CM | POA: Diagnosis not present

## 2023-03-21 DIAGNOSIS — E1142 Type 2 diabetes mellitus with diabetic polyneuropathy: Secondary | ICD-10-CM

## 2023-03-21 DIAGNOSIS — R269 Unspecified abnormalities of gait and mobility: Secondary | ICD-10-CM | POA: Diagnosis not present

## 2023-03-21 DIAGNOSIS — M79674 Pain in right toe(s): Secondary | ICD-10-CM | POA: Diagnosis not present

## 2023-03-21 NOTE — Progress Notes (Unsigned)
Subjective:  Patient ID: Melinda [name], female    DOB: 04/28/1966,  MRN: [id number]  57 y.o. female presents to clinic with  {jgcomplaint:23593} No chief complaint on file.    New problem(s): None {jgcomplaint:23593}  PCP is [facility], [place], [place].  Allergies  Allergen Reactions   Ceftriaxone Anaphylaxis and Other (See Comments)    ROCEPHIN   Metformin Anaphylaxis    ALL   Penicillins Shortness Of Breath    Has patient had a PCN reaction causing immediate rash, facial/tongue/throat swelling, SOB or lightheadedness with hypotension: Yes Has patient had a PCN reaction causing severe rash involving mucus membranes or skin necrosis: No Has patient had a PCN reaction that required hospitalization No Has patient had a PCN reaction occurring within the last 10 years: No If all of the above answers are "NO", then may proceed with Cephalosporin use.    Shellfish Allergy Anaphylaxis   Citrus Rash   Flonase [Fluticasone Propionate]     Makes migraines worse   Gold-Containing Drug Products     HANDS ITCH   Metformin Hcl Other (See Comments)   Nickel     HANDS SWELL   Penicillin G Other (See Comments)    Review of Systems: Negative except as noted in the HPI.   Objective:  Melinda [name] is a pleasant 57 y.o. female {jgbodyhabitus:24098}.  Vascular Examination: Vascular status intact b/l with palpable pedal pulses. CFT immediate b/l. No edema. No pain with calf compression b/l. Skin temperature gradient WNL b/l. {jgvascular:23595}  Neurological Examination: Sensation grossly intact b/l with 10 gram monofilament. Vibratory sensation intact b/l. {jgneuro:23601::"Protective sensation intact 5/5 intact bilaterally with 10g monofilament b/l.","Vibratory sensation intact b/l.","Proprioception intact bilaterally."}  Dermatological Examination: Pedal skin with normal turgor, texture and tone b/l. Toenails 1-5 b/l thick, discolored, elongated with subungual debris and pain on dorsal  palpation. No hyperkeratotic lesions noted b/l. {jgderm:23598}  Musculoskeletal Examination: Muscle strength 5/5 to b/l LE. {jgmsk:23600}  Radiographs: None  Assessment:   1. Pain due to onychomycosis of toenails of both feet   2. Controlled type 2 diabetes mellitus with complication, with long-term current use of insulin (HCC)    Plan:  {jgplan:23602::"-Patient/POA to call should there be question/concern in the interim."}  Return in about 3 months (around 06/20/2023).  Freddie Breech, DPM

## 2023-03-23 DIAGNOSIS — R269 Unspecified abnormalities of gait and mobility: Secondary | ICD-10-CM | POA: Diagnosis not present

## 2023-03-24 DIAGNOSIS — R269 Unspecified abnormalities of gait and mobility: Secondary | ICD-10-CM | POA: Diagnosis not present

## 2023-03-25 ENCOUNTER — Other Ambulatory Visit: Payer: Medicaid Other | Admitting: Obstetrics and Gynecology

## 2023-03-25 DIAGNOSIS — R269 Unspecified abnormalities of gait and mobility: Secondary | ICD-10-CM | POA: Diagnosis not present

## 2023-03-25 DIAGNOSIS — G4733 Obstructive sleep apnea (adult) (pediatric): Secondary | ICD-10-CM | POA: Diagnosis not present

## 2023-03-25 NOTE — Patient Outreach (Signed)
Medicaid Managed Care   Unsuccessful Attempt Note   03/25/2023 Name: Melinda [name] MRN: [id number] DOB: 1966-04-22  Referred by: Collene Mares, PA Reason for referral : High Risk Managed Medicaid (Unsuccessful telephone outreach)  An unsuccessful telephone outreach was attempted today. The patient was referred to the case management team for assistance with care management and care coordination.    Follow Up Plan: The Managed Medicaid care management team will reach out to the patient again over the next 30 business  days. and The  Patient has been provided with contact information for the Managed Medicaid care management team and has been advised to call with any health related questions or concerns.   [name], BSN [place]  [employer] - Managed Medicaid High Risk [phone]

## 2023-03-25 NOTE — Patient Instructions (Signed)
Hi Ms. Delafosse, I am sorry I missed you today, I hope you are doing okay - as a part of your Medicaid benefit, you are eligible for care management and care coordination services at no cost or copay. I was unable to reach you by phone today but would be happy to help you with your health related needs. Please feel free to call me at [phone].  A member of the Managed Medicaid care management team will reach out to you again over the next 30 business  days.   [name], BSN [place]  [employer] - Managed Medicaid High Risk [phone]

## 2023-03-26 DIAGNOSIS — Z23 Encounter for immunization: Secondary | ICD-10-CM | POA: Diagnosis not present

## 2023-03-26 DIAGNOSIS — R269 Unspecified abnormalities of gait and mobility: Secondary | ICD-10-CM | POA: Diagnosis not present

## 2023-03-26 DIAGNOSIS — E890 Postprocedural hypothyroidism: Secondary | ICD-10-CM | POA: Diagnosis not present

## 2023-03-29 ENCOUNTER — Emergency Department (HOSPITAL_COMMUNITY): Payer: Medicaid Other

## 2023-03-29 ENCOUNTER — Emergency Department (HOSPITAL_COMMUNITY)
Admission: EM | Admit: 2023-03-29 | Discharge: 2023-03-29 | Disposition: A | Discharge status: Home / Self Care | Payer: Medicaid Other | Attending: Emergency Medicine | Admitting: Emergency Medicine

## 2023-03-29 ENCOUNTER — Encounter (HOSPITAL_COMMUNITY): Payer: Self-pay

## 2023-03-29 ENCOUNTER — Other Ambulatory Visit: Payer: Self-pay

## 2023-03-29 DIAGNOSIS — R059 Cough, unspecified: Secondary | ICD-10-CM | POA: Diagnosis not present

## 2023-03-29 DIAGNOSIS — N200 Calculus of kidney: Secondary | ICD-10-CM | POA: Diagnosis not present

## 2023-03-29 DIAGNOSIS — R112 Nausea with vomiting, unspecified: Secondary | ICD-10-CM | POA: Diagnosis present

## 2023-03-29 DIAGNOSIS — N3001 Acute cystitis with hematuria: Secondary | ICD-10-CM

## 2023-03-29 DIAGNOSIS — N209 Urinary calculus, unspecified: Secondary | ICD-10-CM | POA: Diagnosis not present

## 2023-03-29 DIAGNOSIS — I517 Cardiomegaly: Secondary | ICD-10-CM | POA: Diagnosis not present

## 2023-03-29 DIAGNOSIS — I10 Essential (primary) hypertension: Secondary | ICD-10-CM | POA: Diagnosis not present

## 2023-03-29 DIAGNOSIS — K5790 Diverticulosis of intestine, part unspecified, without perforation or abscess without bleeding: Secondary | ICD-10-CM | POA: Diagnosis not present

## 2023-03-29 DIAGNOSIS — R109 Unspecified abdominal pain: Secondary | ICD-10-CM | POA: Diagnosis not present

## 2023-03-29 LAB — COMPREHENSIVE METABOLIC PANEL
ALT: 33 U/L (ref 0–44)
AST: 20 U/L (ref 15–41)
Albumin: 3.3 g/dL — ABNORMAL LOW (ref 3.5–5.0)
Alkaline Phosphatase: 122 U/L (ref 38–126)
Anion gap: 8 (ref 5–15)
BUN: 7 mg/dL (ref 6–20)
CO2: 25 mmol/L (ref 22–32)
Calcium: 8.8 mg/dL — ABNORMAL LOW (ref 8.9–10.3)
Chloride: 109 mmol/L (ref 98–111)
Creatinine, Ser: 1.12 mg/dL — ABNORMAL HIGH (ref 0.44–1.00)
GFR, Estimated: 58 mL/min — ABNORMAL LOW (ref >60)
Glucose, Bld: 201 mg/dL — ABNORMAL HIGH (ref 70–99)
Potassium: 3.6 mmol/L (ref 3.5–5.1)
Sodium: 142 mmol/L (ref 135–145)
Total Bilirubin: 0.2 mg/dL — ABNORMAL LOW (ref 0.3–1.2)
Total Protein: 6.7 g/dL (ref 6.5–8.1)

## 2023-03-29 LAB — CBC WITH DIFFERENTIAL/PLATELET
Abs Immature Granulocytes: 0.12 10*3/uL — ABNORMAL HIGH (ref 0.00–0.07)
Basophils Absolute: 0 10*3/uL (ref 0.0–0.1)
Basophils Relative: 0 %
Eosinophils Absolute: 0.1 10*3/uL (ref 0.0–0.5)
Eosinophils Relative: 1 %
HCT: 44.8 % (ref 36.0–46.0)
Hemoglobin: 14.3 g/dL (ref 12.0–15.0)
Immature Granulocytes: 1 %
Lymphocytes Relative: 11 %
Lymphs Abs: 1.4 10*3/uL (ref 0.7–4.0)
MCH: 29.7 pg (ref 26.0–34.0)
MCHC: 31.9 g/dL (ref 30.0–36.0)
MCV: 92.9 fL (ref 80.0–100.0)
Monocytes Absolute: 0.6 10*3/uL (ref 0.1–1.0)
Monocytes Relative: 4 %
Neutro Abs: 10.3 10*3/uL — ABNORMAL HIGH (ref 1.7–7.7)
Neutrophils Relative %: 83 %
Platelets: 241 10*3/uL (ref 150–400)
RBC: 4.82 MIL/uL (ref 3.87–5.11)
RDW: 13.7 % (ref 11.5–15.5)
WBC: 12.5 10*3/uL — ABNORMAL HIGH (ref 4.0–10.5)
nRBC: 0 % (ref 0.0–0.2)

## 2023-03-29 LAB — URINALYSIS, ROUTINE W REFLEX MICROSCOPIC
Bilirubin Urine: NEGATIVE
Glucose, UA: NEGATIVE mg/dL
Ketones, ur: NEGATIVE mg/dL
Nitrite: NEGATIVE
Protein, ur: 30 mg/dL — AB
RBC / HPF: >50 RBC/hpf (ref 0–5)
Specific Gravity, Urine: 1.017 (ref 1.005–1.030)
pH: 5 (ref 5.0–8.0)

## 2023-03-29 LAB — LIPASE, BLOOD: Lipase: 21 U/L (ref 11–51)

## 2023-03-29 MED ORDER — ACETAMINOPHEN 500 MG PO TABS
1000.0000 mg | ORAL_TABLET | ORAL | Status: AC
  Administered 2023-03-29: 1000 mg via ORAL
  Filled 2023-03-29: qty 2

## 2023-03-29 MED ORDER — MORPHINE SULFATE (PF) 4 MG/ML IV SOLN
4.0000 mg | Freq: Once | INTRAVENOUS | Status: AC
  Administered 2023-03-29: 4 mg via INTRAVENOUS
  Filled 2023-03-29: qty 1

## 2023-03-29 MED ORDER — NITROFURANTOIN MONOHYD MACRO 100 MG PO CAPS
100.0000 mg | ORAL_CAPSULE | Freq: Once | ORAL | Status: AC
  Administered 2023-03-29: 100 mg via ORAL
  Filled 2023-03-29: qty 1

## 2023-03-29 MED ORDER — ONDANSETRON HCL 4 MG/2ML IJ SOLN
4.0000 mg | Freq: Once | INTRAMUSCULAR | Status: AC
  Administered 2023-03-29: 4 mg via INTRAVENOUS
  Filled 2023-03-29: qty 2

## 2023-03-29 MED ORDER — METOCLOPRAMIDE HCL 5 MG/ML IJ SOLN
10.0000 mg | Freq: Once | INTRAMUSCULAR | Status: AC
  Administered 2023-03-29: 10 mg via INTRAVENOUS
  Filled 2023-03-29: qty 2

## 2023-03-29 MED ORDER — ONDANSETRON 4 MG PO TBDP: 4.0000 mg | ORAL_TABLET | Freq: Three times a day (TID) | ORAL | 0 refills | Status: DC | PRN

## 2023-03-29 MED ORDER — NITROFURANTOIN MONOHYD MACRO 100 MG PO CAPS: 100.0000 mg | ORAL_CAPSULE | Freq: Two times a day (BID) | ORAL | 0 refills | Status: DC

## 2023-03-29 MED ORDER — IBUPROFEN 600 MG PO TABS: 600.0000 mg | ORAL_TABLET | Freq: Four times a day (QID) | ORAL | 0 refills | Status: DC | PRN

## 2023-03-29 MED ORDER — DIPHENHYDRAMINE HCL 25 MG PO CAPS
25.0000 mg | ORAL_CAPSULE | Freq: Once | ORAL | Status: AC
  Administered 2023-03-29: 25 mg via ORAL
  Filled 2023-03-29: qty 1

## 2023-03-29 NOTE — ED Triage Notes (Signed)
Patient BIB [facility] from home due to abdominal pain. Patient states this started at 2am. Patient states pain is left sided. Patient took tylenol with no relief. Patient states she has burning with urination. Patient A&Ox4.

## 2023-03-29 NOTE — Discharge Instructions (Addendum)
Our radiologist agrees that there are punctate--or very small--kidney stones in your bladder which means that they should pass very soon and your symptoms should resolve.  I am treating you for a urinary tract infection since you have some symptoms concerning for this and you to have a significant amount of blood in your urine from the kidney stone.  Hydrate, follow-up with [facility].  Tylenol and ibuprofen for pain  Please use Tylenol or ibuprofen for pain.  You may use 600 mg ibuprofen every 6 hours or 1000 mg of Tylenol every 6 hours.  You may choose to alternate between the 2.  This would be most effective.  Not to exceed 4 g of Tylenol within 24 hours.  Not to exceed 3200 mg ibuprofen 24 hours.   Zofran as needed for nausea  Please take all of your antibiotics until finished!   You may develop abdominal discomfort or diarrhea from the antibiotic.  You may help offset this with probiotics which you can buy or get in yogurt. Do not eat  or take the probiotics until 2 hours after your antibiotic.

## 2023-03-29 NOTE — ED Provider Notes (Signed)
[place] [facility] DE[facility]MENT AT [facility] Provider Note   CSN: [id number] Arrival date & time: 03/29/23  4742     History  Chief Complaint  Patient presents with   Flank Pain    Melinda [name] is a 57 y.o. female.   Flank Pain   Patient is a 57 year old female presented emergency room today with complaints of left flank pain nausea and vomiting.  She states that she woke up last night with sudden left flank pain.  She states that it has been severe and constant but waxing and waning since.  She states it is actually improved some now and seems to have moved into her lower belly.  She denies any fevers or chills no chest pain or difficulty breathing.  She occasionally coughs and has some sinus congestion does not feel short of breath however.  She states for the past 3 days she has had some urinary frequency and some urgency.  She has not noticed blood in her urine however.  Seems that she had a similar episode 1 month ago and was told that she likely had a kidney stone.       Home Medications Prior to Admission medications   Medication Sig Start Date End Date Taking? Authorizing Provider  ibuprofen (ADVIL) 600 MG tablet Take 1 tablet (600 mg total) by mouth every 6 (six) hours as needed. 03/29/23  Yes Calen Geister, Rodrigo Ran, PA  nitrofurantoin, macrocrystal-monohydrate, (MACROBID) 100 MG capsule Take 1 capsule (100 mg total) by mouth 2 (two) times daily. 03/29/23  Yes Carrin Vannostrand S, PA  ondansetron (ZOFRAN-ODT) 4 MG disintegrating tablet Take 1 tablet (4 mg total) by mouth every 8 (eight) hours as needed for nausea or vomiting. 03/29/23  Yes Yvaine Jankowiak S, PA  albuterol (PROVENTIL) (2.5 MG/3ML) 0.083% nebulizer solution Take 3 mLs (2.5 mg total) by nebulization every 6 (six) hours as needed for wheezing or shortness of breath. 12/19/20   [name], MD  albuterol (VENTOLIN HFA) 108 (90 Base) MCG/ACT inhaler Inhale 2 puffs into the lungs every 6 (six) hours as  needed for wheezing or shortness of breath. 12/19/20   [name], MD  apixaban (ELIQUIS) 5 MG TABS tablet TAKE ONE TABLET BY MOUTH TWICE DAILY MORNING AND EVENING 08/26/22   [name], MD  atorvastatin (LIPITOR) 20 MG tablet TAKE ONE TABLET BY MOUTH ONCE DAILY AT BEDTIME 05/30/22   [name], MD  budesonide-formoterol (SYMBICORT) 160-4.5 MCG/ACT inhaler Inhale 2 puffs into the lungs 2 (two) times daily. 08/21/21   [name], MD  buPROPion (WELLBUTRIN XL) 300 MG 24 hr tablet Take 300 mg by mouth daily.  09/12/17   [provider]  busPIRone (BUSPAR) 10 MG tablet Take 10 mg by mouth 2 (two) times daily.    [provider]  Cholecalciferol (VITAMIN D3) 10 MCG (400 UNIT) CAPS TAKE ONE CAPSULE BY MOUTH ONCE DAILY (AM) 08/26/22   [name], MD  cyclobenzaprine (FLEXERIL) 10 MG tablet TAKE 1 TABLET (10 MG TOTAL) BY MOUTH 3 (THREE) TIMES DAILY AS NEEDED FOR MUSCLE SPASMS. 04/04/22   [name], MD  doxepin (SINEQUAN) 50 MG capsule Take 150 mg by mouth at bedtime.    [provider]  doxycycline (VIBRAMYCIN) 100 MG capsule Take 1 capsule (100 mg total) by mouth 2 (two) times daily. Patient not taking: Reported on 03/21/2023 02/24/23   Rancour, Jeannett Senior, MD  Erenumab-aooe (AIMOVIG) 140 MG/ML SOAJ Inject 140 mg into the skin every 28 (  twenty-eight) days. Patient not taking: Reported on 03/21/2023 01/06/23   [name], DO  gabapentin (NEURONTIN) 300 MG capsule TAKE ONE CAPSULE BY MOUTH THREE TIMES DAILY (AM+NOON+BEDTIME) 04/04/22   [name], MD  levothyroxine (SYNTHROID) 137 MCG tablet Take 274 mcg by mouth daily before breakfast.    [provider]  loratadine (CLARITIN) 10 MG tablet TAKE 1 TABLET (10 MG TOTAL) BY MOUTH DAILY (AM) 08/26/22   [name], MD  metoprolol tartrate (LOPRESSOR) 50 MG tablet TAKE ONE TABLET BY MOUTH TWICE DAILY (AM+BEDTIME) Patient taking differently: Take 75 mg by mouth 2 (two) times daily. TAKE ONE  TABLET BY MOUTH TWICE DAILY (AM+BEDTIME) 08/26/22   [name], MD  montelukast (SINGULAIR) 10 MG tablet TAKE 1 TABLET BY MOUTH EVERY EVENING AT BEDTIME 08/26/22   [name], MD  nystatin-triamcinolone ointment (MYCOLOG) Apply 1 Application topically 2 (two) times daily. Apply topically two times daily for five days 09/17/22   Terri Piedra, DO  prazosin (MINIPRESS) 2 MG capsule Take 6 mg by mouth at bedtime.    [provider]  Semaglutide, 1 MG/DOSE, 2 MG/1.5ML SOPN Inject 2 mg into the skin once a week. 02/21/22   [provider]  topiramate (TOPAMAX) 200 MG tablet Take 1 tablet (200 mg total) by mouth at bedtime. 01/06/23   [name], DO  traZODone (DESYREL) 50 MG tablet Take 50 mg by mouth at bedtime. Patient not taking: Reported on 03/21/2023    [provider]  VRAYLAR 3 MG capsule Take 3 mg by mouth daily. 02/07/23   [provider]      Allergies    Ceftriaxone, Metformin, Penicillins, Shellfish allergy, Citrus, Flonase [fluticasone propionate], Gold-containing drug products, Metformin hcl, Nickel, and Penicillin g    Review of Systems   Review of Systems  Genitourinary:  Positive for flank pain.    Physical Exam Updated Vital Signs BP 112/67 (BP Location: Right Arm)   Pulse 96   Temp 98.1 F (36.7 C) (Temporal)   Resp 18   Ht 5\' 8"  (1.727 m)   Wt (!) 140.6 kg   SpO2 98%   BMI 47.14 kg/m  Physical Exam Vitals and nursing note reviewed.  Constitutional:      General: She is not in acute distress.    Comments: Uncomfortable 57 year old female  HENT:     Head: Normocephalic and atraumatic.     Nose: Nose normal.  Eyes:     General: No scleral icterus. Cardiovascular:     Rate and Rhythm: Normal rate and regular rhythm.     Pulses: Normal pulses.     Heart sounds: Normal heart sounds.  Pulmonary:     Effort: Pulmonary effort is normal. No respiratory distress.     Breath sounds: No wheezing.  Abdominal:      Palpations: Abdomen is soft.     Tenderness: There is abdominal tenderness.     Comments: Left lower quadrant left CVA tenderness  No other abdominal tenderness.  No guarding or rebound.  Abdomen is morbidly obese  Musculoskeletal:     Cervical back: Normal range of motion.     Right lower leg: No edema.     Left lower leg: No edema.  Skin:    General: Skin is warm and dry.     Capillary Refill: Capillary refill takes less than 2 seconds.  Neurological:     Mental Status: She is alert. Mental status is at baseline.  Psychiatric:  Mood and Affect: Mood normal.        Behavior: Behavior normal.     ED Results / Procedures / Treatments   Labs (all labs ordered are listed, but only abnormal results are displayed) Labs Reviewed  CBC WITH DIFFERENTIAL/PLATELET - Abnormal; Notable for the following components:      Result Value   WBC 12.5 (*)    Neutro Abs 10.3 (*)    Abs Immature Granulocytes 0.12 (*)    All other components within normal limits  COMPREHENSIVE METABOLIC PANEL - Abnormal; Notable for the following components:   Glucose, Bld 201 (*)    Creatinine, Ser 1.12 (*)    Calcium 8.8 (*)    Albumin 3.3 (*)    Total Bilirubin 0.2 (*)    GFR, Estimated 58 (*)    All other components within normal limits  URINALYSIS, ROUTINE W REFLEX MICROSCOPIC - Abnormal; Notable for the following components:   Color, Urine AMBER (*)    APPearance CLOUDY (*)    Hgb urine dipstick LARGE (*)    Protein, ur 30 (*)    Leukocytes,Ua TRACE (*)    Bacteria, UA FEW (*)    All other components within normal limits  LIPASE, BLOOD    EKG None  Radiology CT Renal Stone Study  Addendum Date: 03/29/2023   ADDENDUM REPORT: 03/29/2023 10:01 ADDENDUM: Study discussed by telephone with ER Provider Sadie Pickar on 03/29/2023 at 0943 hours. He questions, and I concur that there are punctate urinary stones within the dependent left urinary bladder. These are best demonstrated on series 6, image  56 (2 stones on that image), only faintly visible on axial image 85. These are near the left UVJ. We discussed that likely these are recently passed renal calculi. The distal left ureter there does appear mildly asymmetric, but there is no upstream hydroureter or hydronephrosis. Electronically Signed   By: Odessa Fleming M.D.   On: 03/29/2023 10:01   Result Date: 03/29/2023 CLINICAL DATA:  57 year old female with abdomen and flank pain. Left lower quadrant pain radiating to the back. EXAM: CT ABDOMEN AND PELVIS WITHOUT CONTRAST TECHNIQUE: Multidetector CT imaging of the abdomen and pelvis was performed following the standard protocol without IV contrast. RADIATION DOSE REDUCTION: This exam was performed according to the departmental dose-optimization program which includes automated exposure control, adjustment of the mA and/or kV according to patient size and/or use of iterative reconstruction technique. COMPARISON:  CT Chest, Abdomen, and Pelvis 02/24/2023. CT Abdomen and Pelvis 10/11/2022. FINDINGS: Lower chest: Mild to moderate chronic lung base atelectasis and scarring is stable. No cardiomegaly. No pericardial or pleural effusion. Hepatobiliary: Chronic cholecystectomy.  Negative noncontrast liver. Pancreas: Negative. Spleen: Negative. Adrenals/Urinary Tract: Left adrenal gland remains normal. Chronically abnormal right adrenal gland, but not significantly changed since 2017 and most compatible with benign myelolipoma (no follow-up imaging recommended). Nonobstructed kidneys. No nephrolithiasis. Stable renal morphology. No evidence of renal inflammation. Decompressed ureters are both normal to the urinary bladder. Diminutive bladder. Incidental pelvic phleboliths. Stomach/Bowel: Redundant but otherwise negative distal large bowel. Decompressed and negative descending colon. Occasional diverticula at the splenic flexure. No active inflammation. Negative transverse colon. Redundant but otherwise negative right colon.  Normal appendix on series 3, image 74 tracking laterally from the near midline cecum. Decompressed terminal ileum and no dilated small bowel. Decompressed stomach. Negative duodenum. No free air, free fluid, or mesenteric inflammation identified. Vascular/Lymphatic: Aortoiliac calcified atherosclerosis. Normal caliber abdominal aorta. Vascular patency is not evaluated in the absence of IV  contrast. No lymphadenopathy identified. Reproductive: Noncontrast appearance is stable, left cornu partially calcified fibroid. Other: No pelvis free fluid. Musculoskeletal: Chronic lumbosacral junction disc and endplate degeneration. No acute osseous abnormality identified. IMPRESSION: 1. No urinary calculus or obstructive uropathy. No acute or inflammatory process identified in the noncontrast abdomen or pelvis. 2.  Aortic Atherosclerosis (ICD10-I70.0). Electronically Signed: By: Odessa Fleming M.D. On: 03/29/2023 08:51   DG Chest 2 View  Result Date: 03/29/2023 CLINICAL DATA:  Cough.  Recent pneumonia. EXAM: CHEST - 2 VIEW COMPARISON:  02/24/2023 FINDINGS: Diminished exam detail secondary to body habitus. Mild cardiac enlargement. No pleural fluid, interstitial edema or airspace disease. Mild diffuse central airway thickening. The visualized osseous structures are unremarkable. Surgical clips noted from previous thyroidectomy. IMPRESSION: 1. Mild cardiac enlargement.  No signs of CHF 2. Mild diffuse central airway thickening. No airspace consolidation. Electronically Signed   By: Signa Kell M.D.   On: 03/29/2023 07:33    Procedures Procedures    Medications Ordered in ED Medications  morphine (PF) 4 MG/ML injection 4 mg (4 mg Intravenous Given 03/29/23 0651)  metoCLOPramide (REGLAN) injection 10 mg (10 mg Intravenous Given 03/29/23 0651)  diphenhydrAMINE (BENADRYL) capsule 25 mg (25 mg Oral Given 03/29/23 0650)  ondansetron (ZOFRAN) injection 4 mg (4 mg Intravenous Given 03/29/23 1000)  nitrofurantoin  (macrocrystal-monohydrate) (MACROBID) capsule 100 mg (100 mg Oral Given 03/29/23 1000)  acetaminophen (TYLENOL) tablet 1,000 mg (1,000 mg Oral Given 03/29/23 1000)    ED Course/ Medical Decision Making/ A&P Clinical Course as of 03/29/23 1350  Sat Mar 29, 2023  0644 Woke up last night with sudden L flank pain, and felt nauseated. Vomited x2. Frequency for 3+ days w burning.  Had a similar pain 1 month ago but no urinary sx at that time.  No fever or chills.  No CP or SOB.  Occasionally coughs and some sinus congestion.  [WF]    Clinical Course User Index [WF] [name], PA                                 Medical Decision Making Amount and/or Complexity of Data Reviewed Radiology: ordered.  Risk OTC drugs. Prescription drug management.   This patient presents to the ED for concern of flank pain, this involves a number of treatment options, and is a complaint that carries with it a high risk of complications and morbidity. A differential diagnosis was considered for the patient's symptoms which is discussed below:   The differential diagnosis of emergent flank pain includes, but is not limited to :Abdominal aortic aneurysm,, Renal artery embolism,Renal vein thrombosis, Aortic dissection, Mesenteric ischemia, Pyelonephritis, Renal infarction, Renal hemorrhage, Nephrolithiasis/ Renal Colic, Bladder tumor,Cystitis, Biliary colic, Pancreatitis Perforated peptic ulcer Appendicitis ,Inguinal Hernia, Diverticulitis, Bowel obstruction (Ectopic Pregnancy,PID/TOA,Ovarian cyst, Ovarian torsion) Shingles Lower lobe pneumonia, Retroperitoneal hematoma/abscess/tumor, Epidural abscess, Epidural hematoma    Co morbidities: Discussed in HPI   Brief History:  Patient is a 57 year old female presented emergency room today with complaints of left flank pain nausea and vomiting.  She states that she woke up last night with sudden left flank pain.  She states that it has been severe and  constant but waxing and waning since.  She states it is actually improved some now and seems to have moved into her lower belly.  She denies any fevers or chills no chest pain or difficulty breathing.  She occasionally coughs and has some sinus  congestion does not feel short of breath however.  She states for the past 3 days she has had some urinary frequency and some urgency.  She has not noticed blood in her urine however.  Seems that she had a similar episode 1 month ago and was told that she likely had a kidney stone.     EMR reviewed including pt PMHx, past surgical history and past visits to ER.   See HPI for more details   Lab Tests:  Urine is relatively unremarkable for UTI however she does have persistent burning of urination states that has been ongoing for 3 days will treat empirically with Macrobid.  Lipase within normal limits, CMP unremarkable creatinine marginally higher than prior she will have this rechecked with PCP.  CBC with leukocytosis no anemia.   Imaging Studies:  Chest x-ray unremarkable.    ADDENDUM: Study discussed by telephone with ER Provider [facility] Mazella Deen on 03/29/2023 at 0943 hours.   He questions, and I concur that there are punctate urinary stones within the dependent left urinary bladder.   These are best demonstrated on series 6, image 56 (2 stones on that image), only faintly visible on axial image 85. These are near the left UVJ. We discussed that likely these are recently passed renal calculi. The distal left ureter there does appear mildly asymmetric, but there is no upstream hydroureter or hydronephrosis.     Electronically Signed   By: Odessa Fleming M.D.   On: 03/29/2023 10:01     Narrative  CLINICAL DATA:  57 year old female with abdomen and flank pain. Left  lower quadrant pain radiating to the back.    EXAM:  CT ABDOMEN AND PELVIS WITHOUT CONTRAST    TECHNIQUE:  Multidetector CT imaging of the abdomen and pelvis was performed   following the standard protocol without IV contrast.    RADIATION DOSE REDUCTION: This exam was performed according to the  departmental dose-optimization program which includes automated  exposure control, adjustment of the mA and/or kV according to  patient size and/or use of iterative reconstruction technique.    COMPARISON:  CT Chest, Abdomen, and Pelvis 02/24/2023. CT Abdomen  and Pelvis 10/11/2022.    FINDINGS:  Lower chest: Mild to moderate chronic lung base atelectasis and  scarring is stable. No cardiomegaly. No pericardial or pleural  effusion.    Hepatobiliary: Chronic cholecystectomy.  Negative noncontrast liver.    Pancreas: Negative.    Spleen: Negative.    Adrenals/Urinary Tract: Left adrenal gland remains normal.    Chronically abnormal right adrenal gland, but not significantly  changed since 2017 and most compatible with benign myelolipoma (no  follow-up imaging recommended).    Nonobstructed kidneys. No nephrolithiasis. Stable renal morphology.  No evidence of renal inflammation. Decompressed ureters are both  normal to the urinary bladder. Diminutive bladder. Incidental pelvic  phleboliths.    Stomach/Bowel: Redundant but otherwise negative distal large bowel.  Decompressed and negative descending colon. Occasional diverticula  at the splenic flexure. No active inflammation. Negative transverse  colon. Redundant but otherwise negative right colon. Normal appendix  on series 3, image 74 tracking laterally from the near midline  cecum. Decompressed terminal ileum and no dilated small bowel.  Decompressed stomach. Negative duodenum. No free air, free fluid, or  mesenteric inflammation identified.    Vascular/Lymphatic: Aortoiliac calcified atherosclerosis. Normal  caliber abdominal aorta. Vascular patency is not evaluated in the  absence of IV contrast. No lymphadenopathy identified.    Reproductive: Noncontrast appearance is stable, left  cornu  partially  calcified fibroid.    Other: No pelvis free fluid.    Musculoskeletal: Chronic lumbosacral junction disc and endplate  degeneration. No acute osseous abnormality identified.    IMPRESSION:  1. No urinary calculus or obstructive uropathy. No acute or  inflammatory process identified in the noncontrast abdomen or  pelvis.  2.  Aortic Atherosclerosis (ICD10-I70.0).    Electronically Signed:  By: Odessa Fleming M.D.  On: 03/29/2023 08:51     Cardiac Monitoring:  NA NA   Medicines ordered:  I ordered medication including Tylenol, Macrobid, Zofran, Reglan, morphine, Benadryl for pain nausea vomiting Reevaluation of the patient after these medicines showed that the patient improved I have reviewed the patients home medicines and have made adjustments as needed   Critical Interventions:     Consults/Attending Physician I discussed this case with my attending physician who cosigned this note including patient's presenting symptoms, physical exam, and planned diagnostics and interventions. Attending physician stated agreement with plan or made changes to plan which were implemented.   Attending physician assessed patient at bedside.     Reevaluation:  After the interventions noted above I re-evaluated patient and found that they have :improved   Social Determinants of Health:      Problem List / ED Course:  Past kidney stones with evidence of them in the bladder.  I discussed with radiology over the phone who agrees.  I suspect the patient may have an early urinary tract infection given her symptoms although UA is relatively unremarkable.  Will treat with Macrobid.  She will follow-up with primary care. Creatinine slightly elevated will need to have this rechecked.  No obstructive uropathy.  Tolerating p.o. hydration without difficulty   Dispostion:  After consideration of the diagnostic results and the patients response to treatment, I feel that the patent  would benefit from discharge home with close outpatient follow-up and return to the emergency room for any new or concerning symptoms.  Will give Zofran for any nausea she experiences at home.      Final Clinical Impression(s) / ED Diagnoses Final diagnoses:  Acute cystitis with hematuria  Kidney stone    Rx / DC Orders ED Discharge Orders          Ordered    nitrofurantoin, macrocrystal-monohydrate, (MACROBID) 100 MG capsule  2 times daily        03/29/23 0948    ondansetron (ZOFRAN-ODT) 4 MG disintegrating tablet  Every 8 hours PRN        03/29/23 0948    ibuprofen (ADVIL) 600 MG tablet  Every 6 hours PRN        03/29/23 0948              [name], PA 03/29/23 1354    Jacalyn Lefevre, MD 03/29/23 1544

## 2023-03-31 ENCOUNTER — Encounter: Payer: Self-pay | Admitting: Surgery

## 2023-03-31 ENCOUNTER — Ambulatory Visit (INDEPENDENT_AMBULATORY_CARE_PROVIDER_SITE_OTHER): Payer: Medicaid Other | Admitting: Surgery

## 2023-03-31 VITALS — BP 122/74 | HR 111 | Temp 98.6°F | Ht 70.0 in | Wt 310.4 lb

## 2023-03-31 DIAGNOSIS — Z6841 Body Mass Index (BMI) 40.0 and over, adult: Secondary | ICD-10-CM | POA: Diagnosis not present

## 2023-03-31 DIAGNOSIS — D1779 Benign lipomatous neoplasm of other sites: Secondary | ICD-10-CM | POA: Diagnosis not present

## 2023-03-31 NOTE — Patient Instructions (Addendum)
Your emptying study test is scheduled for 04/11/2023 10 am at [facility]. Nothing to eat or drink 6 hours prior.

## 2023-04-01 DIAGNOSIS — F4325 Adjustment disorder with mixed disturbance of emotions and conduct: Secondary | ICD-10-CM | POA: Diagnosis not present

## 2023-04-01 DIAGNOSIS — R269 Unspecified abnormalities of gait and mobility: Secondary | ICD-10-CM | POA: Diagnosis not present

## 2023-04-02 DIAGNOSIS — R269 Unspecified abnormalities of gait and mobility: Secondary | ICD-10-CM | POA: Diagnosis not present

## 2023-04-03 DIAGNOSIS — R269 Unspecified abnormalities of gait and mobility: Secondary | ICD-10-CM | POA: Diagnosis not present

## 2023-04-03 NOTE — Progress Notes (Signed)
Outpatient Surgical Follow Up   Melinda [name] is an 57 y.o. female.   Chief Complaint  Patient presents with   Follow-up    Right adrenal mass    HPI:  Melinda [name] is 57 yo Following up for adrenal myolipoma.  Please note that she has had hemorrhage into the right adrenal mass and is in full anticoagulation.  She does have a history of diabetes is seen Dr. Sharl Ma, She  has lost 12 more pounds.  She continues to visit the ER for nonspecific abdominal pains.  She again had another CT scan that I have personally reviewed showing the right adrenal myelo myolipoma.  She endorses Some nonspecific abdominal pain, dull intermittent and mild to moderate on the left side..  No nausea no vomiting. No specific cause for her abdominal pain on the left at this time She does have a hx of COPD, previous pulmonary embolism on Eliquis, diabetes, obesity, hypertension. I also had ordered U/S showing no DVT She continues to endorse nausea early satiety.   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60   HAD TO LEARN TO WALK AGAIN   Iron deficiency anemia due to chronic blood loss 09/09/2014   Low TSH level 09/08/2014   Lumbar herniated disc    Menorrhagia 09/08/2014   Migraine    CLUSTER AND MIGRAINES   Multiple thyroid nodules    Ovarian mass, right 09/09/2014   PE (pulmonary embolism) 2016   Peripheral neuropathy    FINGER TIPS AND TOES NUMB SOME    Preeclampsia 1983   WITH PREGNANCY   PTSD (post-traumatic stress disorder)    Scoliosis    Seizures (HCC) AGE 33   NONE SINCE, HAD CHICKEN POX THEN    Past Surgical History:  Procedure Laterality Date   ANGIOGRAM TO LEG  08-13-15   RIGHT   CHOLECYSTECTOMY     IR GENERIC HISTORICAL  04/04/2016   IR RADIOLOGIST EVAL & MGMT 04/04/2016 Simonne Come, MD [facility] INTERV RAD   RADIOLOGY WITH ANESTHESIA N/A 07/17/2021   Procedure: MRI LUMBAR SPINE WITHOUT CONTRAST; MRI CERVICAL SPINE WITHOUT CONTRAST WITH ANESTHESIA;  Surgeon: Radiologist, Medication, MD;  Location: [facility] OR;  Service: Radiology;  Laterality: N/A;   THYROIDECTOMY N/A 11/17/2015   Procedure: TOTAL THYROIDECTOMY;  Surgeon: Darnell Level, MD;  Location: [facility] ORS;  Service: General;  Laterality: N/A;   UTERINE ARTERY EMBOLIZATION Bilateral 08/13/2015    Family History  Problem Relation Age of Onset   Diabetes Mother    Breast cancer Mother    CAD Mother    Hypertension Mother    Alcohol abuse Father    Hypertension Father    Breast cancer Maternal Aunt    Breast cancer Maternal Aunt    Kidney nephrosis Son    Diabetes Son     Social History:  reports that  she quit smoking about 8 years ago. Her smoking use included cigarettes. She started smoking about 48 years ago. She has a 20 pack-year smoking history. She has been exposed to tobacco smoke. She has never used smokeless tobacco. She reports that she does not drink alcohol and does not use drugs.  Allergies:  Allergies  Allergen Reactions   Ceftriaxone Anaphylaxis and Other (See Comments)    ROCEPHIN   Metformin Anaphylaxis    ALL   Penicillins Shortness Of Breath    Has patient had a PCN reaction causing immediate rash, facial/tongue/throat swelling, SOB or lightheadedness with hypotension: Yes Has patient had a PCN reaction causing severe rash involving mucus membranes or skin necrosis: No Has patient had a PCN reaction that required hospitalization No Has patient had a PCN  reaction occurring within the last 10 years: No If all of the above answers are "NO", then may proceed with Cephalosporin use.    Shellfish Allergy Anaphylaxis   Citrus Rash   Flonase [Fluticasone Propionate]     Makes migraines worse   Gold-Containing Drug Products     HANDS ITCH   Metformin Hcl Other (See Comments)   Nickel     HANDS SWELL   Penicillin G Other (See Comments)    Medications reviewed.    ROS Full ROS performed and is otherwise negative other than what is stated in HPI   BP 122/74 (BP Location: Left Arm, Patient Position: Sitting, Cuff Size: Large)   Pulse (!) 111   Temp 98.6 F (37 C) (Oral)   Ht 5\' 10"  (1.778 m)   Wt (!) 310 lb 6.4 oz (140.8 kg)   SpO2 94%   BMI 44.54 kg/m   Physical Exam  CONSTITUTIONAL: NAD BMI 44, uses a walker. EYES: Pupils are equal, round,  Sclera are non-icteric. EARS, NOSE, MOUTH AND THROAT: . Hearing is intact to voice. LYMPH NODES:  Lymph nodes in the neck are normal. RESPIRATORY:  Lungs are clear. There is normal respiratory effort, with equal breath sounds bilaterally, and without pathologic use of accessory muscles. CARDIOVASCULAR: Heart is regular without murmurs, gallops, or rubs. GI: The abdomen is  soft, reducible umbilical incisional hernia. There are no palpable masses. There is no hepatosplenomegaly. There are normal bowel sounds in all quadrants. GU: Rectal deferred.  SKIN: Turgor is good and there are no pathologic skin lesions or ulcers. NEUROLOGIC: Motor and sensation is grossly normal. Cranial nerves are grossly intact. PSYCH:  Oriented to person, place and time. Affect is normal.  Assessment/Plan: 57 year old lady with multiple medical issues with a BMI of 44.5 with myelo lipoma of the right adrenal with prior history of hemorrhage into the right side currently anticoagulated.  Seems to be a difficult situation.  1 might argue that given her anticoagulation she is at risk of rebleeding from the right adrenal  lesion.  On the other hand she does have significant comorbidities to include a BMI that is 44.5, COPD, diabetes and recurrent hospitalizations. In addition to this she has nonspecific GI symptoms.  I was very candid with her.  For now would like to make sure that she follows up with GI and get a Oestreich emptying study. I am not in a [place] to perform any adrenalectomies at this time Please note that I spent 30 minutes in visit including coordination of care personally reviewing, placing orders and performing appropriate   Sterling Big, MD [facility] General Surgeon

## 2023-04-04 DIAGNOSIS — R269 Unspecified abnormalities of gait and mobility: Secondary | ICD-10-CM | POA: Diagnosis not present

## 2023-04-07 DIAGNOSIS — R269 Unspecified abnormalities of gait and mobility: Secondary | ICD-10-CM | POA: Diagnosis not present

## 2023-04-08 DIAGNOSIS — R269 Unspecified abnormalities of gait and mobility: Secondary | ICD-10-CM | POA: Diagnosis not present

## 2023-04-08 DIAGNOSIS — F4325 Adjustment disorder with mixed disturbance of emotions and conduct: Secondary | ICD-10-CM | POA: Diagnosis not present

## 2023-04-09 ENCOUNTER — Other Ambulatory Visit: Payer: Medicaid Other | Admitting: Licensed Clinical Social Worker

## 2023-04-09 DIAGNOSIS — R269 Unspecified abnormalities of gait and mobility: Secondary | ICD-10-CM | POA: Diagnosis not present

## 2023-04-09 NOTE — Patient Instructions (Signed)
Visit Information  Melinda [name] was given information about Medicaid Managed Care team care coordination services as a part of their [facility] Community Plan Medicaid benefit. Melinda [name] verbally consented to engagement with the [facility] Managed Care team.   If you are experiencing a medical emergency, please call 911 or report to your local emergency department or urgent care.   If you have a non-emergency medical problem during routine business hours, please contact your provider's office and ask to speak with a nurse.   For questions related to your [facility] please call: [phone] or visit the homepage here: [email or web address]  If you would like to schedule transportation through your [facility], please call the following number at least 2 days in advance of your appointment: [phone]   Rides for urgent appointments can also be made after hours by calling Member Services.  Call the Behavioral Health Crisis Line at [phone], at any time, 24 hours a day, 7 days a week. If you are in danger or need immediate medical attention call 911.  If you would like help to quit smoking, call 1-800-QUIT-NOW ((414) 832-6714) OR Espaol: 1-855-Djelo-Ya (7-253-664-4034) o para ms informacin haga clic aqu or Text READY to 742-595 to register via text  Following is a copy of your plan of care:  Care Plan : LCSW plan of care  Updates made by [name], LCSW since 04/09/2023 12:00 AM     Problem: Therapy      Goal: Therapy Needs Identified   Start Date: 01/21/2023  Note:   Priority: High  Timeframe:  Short-Range Goal Priority:  High Start Date:   01/21/23          Expected End Date:  04/09/23  No follow up date as goal have been achieved and patient is now successfully connected with a long term therapist   - keep 90 percent of scheduled appointments- counseling and  psychiatry -consider bumping up your self-care  -consider creating a stronger support network    Current barriers:   Mental Health needs related to depression/stress management. Patient requires Support, Education, Resources, Referrals, Advocacy, and Care Coordination, in order to meet Unmet Mental Health Needs. Will lose current therapy at [facility] as of 01/30/23 Patient will implement clinical interventions discussed today to decrease symptoms of depression and increase knowledge and/or ability of: coping skills. Patient lacks knowledge of available community counseling agencies and resources.  Clinical Goal(s): verbalize understanding of plan for management of Stress and demonstrate a reduction in symptoms. Patient will connect with a provider for ongoing mental health treatment, increase coping skills, healthy habits, self-management skills, and stress reduction        Patient Goals/Self-Care Activities: Over the next 120 days Attend scheduled medical appointments Utilize healthy coping skills and supportive resources discussed Contact PCP with any questions or concerns Keep 90 percent of counseling appointments Call your insurance provider for more information about your Enhanced Benefits  Check out counseling resources provided  Begin personal counseling with LCSW, to reduce and manage symptoms of Stress, until well-established with a new mental health therapist.  Incorporate into daily practice - relaxation techniques, deep breathing exercises, and mindfulness meditation strategies. Talk about feelings with friends, family members, spiritual advisor, etc. Contact LCSW directly [phone]), if you have questions, need assistance, or if additional social work needs are identified between now and our next scheduled telephone outreach call. Call 988 for mental health hotline/crisis line if needed (24/7 available) Try techniques  to reduce symptoms of anxiety/negative thinking (deep  breathing, distraction, positive self talk, etc)  - develop a personal safety plan - develop a plan to deal with triggers like holidays, anniversaries - exercise at least 2 to 3 times per week - have a plan for how to handle bad days - journal feelings and what helps to feel better or worse - spend time or talk with others at least 2 to 3 times per week - watch for early signs of feeling worse - begin personal counseling - call and visit an old friend - check out volunteer opportunities - join a support group - laugh; watch a funny movie or comedian - learn and use visualization or guided imagery - perform a random act of kindness - practice relaxation or meditation daily - start or continue a personal journal - practice positive thinking and self-talk -continue with compliance of taking medication  -identify current effective and ineffective coping strategies.  -implement positive self-talk in care to increase self-esteem, confidence and feelings of control.  -consider alternative and complementary therapy approaches such as meditation, mindfulness or yoga.  Call your insurance provider to gain education on benefits if desired Call your primary care doctor if symptoms get worse  -journaling, prayer, worship services, meditation or pastoral counseling.  -increase participation in pleasurable group activities such as hobbies, singing, sports or volunteering).  -consider the use of meditative movement therapy such as tai chi, yoga or qigong.  -start a regular daily exercise program based on tolerance, ability and patient choice to support positive thinking and activity    If you are experiencing a Mental Health or Behavioral Health Crisis or need someone to talk to, please call the Suicide and Crisis Lifeline: 11   [name], BSW, MSW, [name] Managed Medicaid LCSW [facility]  [email or web address] Phone: [phone]

## 2023-04-09 NOTE — Patient Outreach (Signed)
Medicaid Managed Care Social Work Note  04/09/2023 Name:  Melinda [name] MRN:  [id number] DOB:  [birth year]  Melinda [name] is an 57 y.o. year old female who is a primary patient of [place], [place], [place].  The Medicaid Managed Care Coordination team was consulted for assistance with:  Mental Health Counseling and Resources  Ms. Gookin was given information about Medicaid Managed Care Coordination team services today. Melinda [name] Patient agreed to services and verbal consent obtained.  Engaged with patient  for by telephone forfollow up visit in response to referral for case management and/or care coordination services.   Assessments/Interventions:  Review of past medical history, allergies, medications, health status, including review of consultants reports, laboratory and other test data, was performed as part of comprehensive evaluation and provision of chronic care management services.  SDOH: (Social Determinant of Health) assessments and interventions performed: SDOH Interventions    Flowsheet Row Patient Outreach Telephone from 04/09/2023 in [place] POPULATION HEALTH DEPARTMENT Patient Outreach Telephone from 03/18/2023 in [place] POPULATION HEALTH DEPARTMENT Patient Outreach Telephone from 02/18/2023 in [place] POPULATION HEALTH DEPARTMENT Patient Outreach Telephone from 02/14/2023 in [place] POPULATION HEALTH DEPARTMENT Patient Outreach Telephone from 01/21/2023 in [place] POPULATION HEALTH DEPARTMENT Patient Outreach Telephone from 01/13/2023 in  POPULATION HEALTH DEPARTMENT  SDOH Interventions        Food Insecurity Interventions -- -- -- Intervention Not Indicated -- --  Housing Interventions -- Intervention Not Indicated -- -- -- --  Alcohol Usage Interventions -- -- Intervention Not Indicated (Score <7) -- -- --  Depression Interventions/Treatment  -- -- -- Medication, Currently on Treatment, Counseling  [in process of establishing with a new  therapist/counselor due to insurance.] -- --  Physical Activity Interventions -- -- Intervention Not Indicated  [not able to perform this type of exercise] -- -- --  Stress Interventions Offered [employer], Provide Counseling Offered [employer], Provide Counseling -- -- Provide Counseling, Offered [employer] Resources Intervention Not Indicated  Health Literacy Interventions -- -- -- -- -- Intervention Not Indicated       Advanced Directives Status:  See Care Plan for related entries.  Care Plan                 Allergies  Allergen Reactions   Ceftriaxone Anaphylaxis and Other (See Comments)    ROCEPHIN   Metformin Anaphylaxis    ALL   Penicillins Shortness Of Breath    Has patient had a PCN reaction causing immediate rash, facial/tongue/throat swelling, SOB or lightheadedness with hypotension: Yes Has patient had a PCN reaction causing severe rash involving mucus membranes or skin necrosis: No Has patient had a PCN reaction that required hospitalization No Has patient had a PCN reaction occurring within the last 10 years: No If all of the above answers are "NO", then may proceed with Cephalosporin use.    Shellfish Allergy Anaphylaxis   Citrus Rash   Flonase [Fluticasone Propionate]     Makes migraines worse   Gold-Containing Drug Products     HANDS ITCH   Metformin Hcl Other (See Comments)   Nickel     HANDS SWELL   Penicillin G Other (See Comments)    Medications Reviewed Today   Medications were not reviewed in this encounter     Patient Active Problem List   Diagnosis Date Noted   Postoperative hypothyroidism 01/21/2022   Esophageal dysphagia 01/21/2022   History of DVT (deep vein thrombosis) 01/21/2022  History of malignant neoplasm of thyroid 01/21/2022   Long term (current) use of anticoagulants 01/21/2022   Migraine without aura, not refractory 01/21/2022   Type 2 diabetes mellitus with hyperglycemia (HCC)  01/21/2022   Spinal stenosis excluding cervical region 11/21/2021   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   Abdominal pannus 08/21/2021   Hardening of the aorta (main artery of the heart) (HCC) 08/21/2021   At high risk for falls 04/20/2020   Hyperlipidemia 02/16/2020   Adrenal mass, right (HCC) 04/29/2019   Chronic cough 03/12/2018   Left shoulder pain 10/14/2016   Primary hypertension 10/14/2016   S/P thyroidectomy 06/27/2016   Chronic bilateral low back pain without sciatica 04/22/2016   Liver lesion 12/24/2015   Malignant tumor of thyroid gland (HCC) 11/16/2015   Controlled type 2 diabetes mellitus with complication, with long-term current use of insulin (HCC) 10/10/2015   Vision loss 08/25/2015   Decreased vision in both eyes 08/25/2015   Prolonged Q-T interval on ECG 08/19/2015   History of pulmonary embolism 08/10/2015   Mild persistent asthma 04/13/2015   Nonruptured cerebral aneurysm 04/13/2015   Fibroid, uterine    Morbid obesity (HCC) 11/11/2014   Atypical chest pain 11/08/2014   Sinus tachycardia 11/08/2014   Migraine 10/14/2014   Menorrhagia 09/08/2014    Care Plan : LCSW plan of care  Updates made by [name], LCSW since 04/09/2023 12:00 AM     Problem: Therapy      Goal: Therapy Needs Identified   Start Date: 01/21/2023  Note:   Priority: High  Timeframe:  Short-Range Goal Priority:  High Start Date:   01/21/23          Expected End Date:  04/09/23  No follow up date as goal have been achieved and patient is now successfully connected with a long term therapist   - keep 90 percent of scheduled appointments- counseling and psychiatry -consider bumping up your self-care  -consider creating a stronger support network    Current barriers:   Mental Health needs related to depression/stress management. Patient requires Support, Education, Resources, Referrals, Advocacy, and Care Coordination, in order to meet Unmet Mental Health Needs. Will  lose current therapy at [facility] as of 01/30/23 Patient will implement clinical interventions discussed today to decrease symptoms of depression and increase knowledge and/or ability of: coping skills. Patient lacks knowledge of available community counseling agencies and resources.  Clinical Goal(s): verbalize understanding of plan for management of Stress and demonstrate a reduction in symptoms. Patient will connect with a provider for ongoing mental health treatment, increase coping skills, healthy habits, self-management skills, and stress reduction        Clinical Interventions:  Assessed patient's previous and current treatment, coping skills, support system and barriers to care. Patient provided hx  Verbalization of feelings encouraged, motivational interviewing employed Emotional support provided, positive coping strategies explored. As of 01/30/23 will no longer be able to keep seeing therapist at [facility] as they will not take [facility] Managed Medicaid anymore. Patient has a psychiatrist at [facility] and was advised to contact agency to question if she can start therapy there. Joint phone call made to [facility] and voice message left inquiring about warm transfer between therapist.  Establishing healthy boundaries emphasized and healthy self-care education provided Patient was educated on available mental health resources within their area that accept Medicaid and offer counseling. Email sent to patient today with available mental health resources within her area that accept Medicaid and offer  the services that she is interested in. Email included some crisis support resources and [facility]'s walk in clinic hours.  Patient will work on implementing appropriate self-care habits into their daily routine such as: staying positive, writing a gratitude list, drinking water, staying active around the house, taking their medications as prescribed, combating negative thoughts or  emotions and staying connected with their family and friends. Positive reinforcement provided for this decision to work on this. Motivational Interviewing employed Depression screen reviewed  PHQ2/ PHQ9 completed or reviewed  Mindfulness or Relaxation training provided Active listening / Reflection utilized  Advance Care and HCPOA education provided Emotional Support Provided Problem Solving /Task Center strategies reviewed Provided psychoeducation for mental health needs  Provided brief CBT  Reviewed mental health medications and discussed importance of compliance:  Quality of sleep assessed & Sleep Hygiene techniques promoted  Participation in counseling encouraged  Verbalization of feelings encouraged  Suicidal Ideation/Homicidal Ideation assessed: Patient denies SI/HI  Review resources, discussed options and provided patient information about  Mental Health Resources Inter-disciplinary care team collaboration (see longitudinal plan of care) Patient reports that she reviewed resources that [facility] LCSW sent her by email and called [facility] and reports that they will be able to take her for therapy mid August of 2024. She was advised to contact them in two weeks to follow up on this and to contact this [facility] LCSW directly if she needs any assistance with their enrollment process. Update-Patient reports that she did not feel quite comfortable with therapist at [facility] and discontinued enrollment into their program. Patient reports that she was able to connect with her previous counselor and was informed that they will be able to accept her back for long term therapy. [facility] LCSW will follow up next month to ensure patient was successfully connected to needed services. Brief self-care education provided. Update- Patient reports that she has successfully been reconnected with previous therapist and has started back treatment. Patient denies any further clinical social work needs. [facility] LCSW will sign  off at this time since patient has a long term mental health therapist and is currently comfortable with her provider. Patient will contact this [facility] LCSW in the future in case this ever changes.    Patient Goals/Self-Care Activities: Over the next 120 days Attend scheduled medical appointments Utilize healthy coping skills and supportive resources discussed Contact PCP with any questions or concerns Keep 90 percent of counseling appointments Call your insurance provider for more information about your Enhanced Benefits  Check out counseling resources provided  Begin personal counseling with LCSW, to reduce and manage symptoms of Stress, until well-established with a new mental health therapist.  Incorporate into daily practice - relaxation techniques, deep breathing exercises, and mindfulness meditation strategies. Talk about feelings with friends, family members, spiritual advisor, etc. Contact LCSW directly [phone]), if you have questions, need assistance, or if additional social work needs are identified between now and our next scheduled telephone outreach call. Call 988 for mental health hotline/crisis line if needed (24/7 available) Try techniques to reduce symptoms of anxiety/negative thinking (deep breathing, distraction, positive self talk, etc)  - develop a personal safety plan - develop a plan to deal with triggers like holidays, anniversaries - exercise at least 2 to 3 times per week - have a plan for how to handle bad days - journal feelings and what helps to feel better or worse - spend time or talk with others at least 2 to 3 times per week - watch for  early signs of feeling worse - begin personal counseling - call and visit an old friend - check out volunteer opportunities - join a support group - laugh; watch a funny movie or comedian - learn and use visualization or guided imagery - perform a random act of kindness - practice relaxation or meditation daily -  start or continue a personal journal - practice positive thinking and self-talk -continue with compliance of taking medication  -identify current effective and ineffective coping strategies.  -implement positive self-talk in care to increase self-esteem, confidence and feelings of control.  -consider alternative and complementary therapy approaches such as meditation, mindfulness or yoga.  Call your insurance provider to gain education on benefits if desired Call your primary care doctor if symptoms get worse  -journaling, prayer, worship services, meditation or pastoral counseling.  -increase participation in pleasurable group activities such as hobbies, singing, sports or volunteering).  -consider the use of meditative movement therapy such as tai chi, yoga or qigong.  -start a regular daily exercise program based on tolerance, ability and patient choice to support positive thinking and activity    If you are experiencing a Mental Health or Behavioral Health Crisis or need someone to talk to, please call the Suicide and Crisis Lifeline: 988       Follow up:  Patient agrees to Care Plan and Follow-up.  Plan: The Managed Medicaid care management team will reach out to the patient again over the next 30 days.  [name], BSW, MSW, [name] Managed Medicaid LCSW [facility]  [facility] [email or web address] Phone: [phone]

## 2023-04-10 DIAGNOSIS — R269 Unspecified abnormalities of gait and mobility: Secondary | ICD-10-CM | POA: Diagnosis not present

## 2023-04-11 ENCOUNTER — Encounter
Admission: RE | Admit: 2023-04-11 | Discharge: 2023-04-11 | Disposition: A | Discharge status: Home / Self Care | Payer: Medicaid Other | Source: Ambulatory Visit | Attending: Surgery | Admitting: Surgery

## 2023-04-11 DIAGNOSIS — R269 Unspecified abnormalities of gait and mobility: Secondary | ICD-10-CM | POA: Diagnosis not present

## 2023-04-11 DIAGNOSIS — D1779 Benign lipomatous neoplasm of other sites: Secondary | ICD-10-CM | POA: Insufficient documentation

## 2023-04-11 DIAGNOSIS — Z7985 Long-term (current) use of injectable non-insulin antidiabetic drugs: Secondary | ICD-10-CM | POA: Diagnosis not present

## 2023-04-11 MED ORDER — TECHNETIUM TC 99M SULFUR COLLOID
2.0000 | Freq: Once | INTRAVENOUS | Status: AC | PRN
  Administered 2023-04-11: 2.17 via INTRAVENOUS

## 2023-04-12 DIAGNOSIS — G4733 Obstructive sleep apnea (adult) (pediatric): Secondary | ICD-10-CM | POA: Diagnosis not present

## 2023-04-14 DIAGNOSIS — R269 Unspecified abnormalities of gait and mobility: Secondary | ICD-10-CM | POA: Diagnosis not present

## 2023-04-15 ENCOUNTER — Other Ambulatory Visit: Payer: Medicaid Other | Admitting: Obstetrics and Gynecology

## 2023-04-15 DIAGNOSIS — F331 Major depressive disorder, recurrent, moderate: Secondary | ICD-10-CM | POA: Diagnosis not present

## 2023-04-15 DIAGNOSIS — F4325 Adjustment disorder with mixed disturbance of emotions and conduct: Secondary | ICD-10-CM | POA: Diagnosis not present

## 2023-04-15 DIAGNOSIS — F411 Generalized anxiety disorder: Secondary | ICD-10-CM | POA: Diagnosis not present

## 2023-04-15 DIAGNOSIS — F4310 Post-traumatic stress disorder, unspecified: Secondary | ICD-10-CM | POA: Diagnosis not present

## 2023-04-15 DIAGNOSIS — R269 Unspecified abnormalities of gait and mobility: Secondary | ICD-10-CM | POA: Diagnosis not present

## 2023-04-15 NOTE — Patient Instructions (Signed)
Hi [name], glad you are okay today, I will follow up with you again next month.  Melinda [name] was given information about Medicaid Managed Care team care coordination services as a part of their [facility] Community Plan Medicaid benefit. Melinda [name] verbally consented to engagement with the [facility] Managed Care team.   If you are experiencing a medical emergency, please call 911 or report to your local emergency department or urgent care.   If you have a non-emergency medical problem during routine business hours, please contact your provider's office and ask to speak with a nurse.   For questions related to your [facility], please call: [phone] or visit the homepage here: [email or web address]  If you would like to schedule transportation through your [facility], please call the following number at least 2 days in advance of your appointment: [phone]   Rides for urgent appointments can also be made after hours by calling Member Services.  Call the Behavioral Health Crisis Line at ([phone])791-6750, at any time, 24 hours a day, 7 days a week. If you are in danger or need immediate medical attention call 911.  If you would like help to quit smoking, call 1-800-QUIT-NOW ((430)887-3248) OR Espaol: 1-855-Djelo-Ya (1-324-401-0272) o para ms informacin haga clic aqu or Text READY to 536-644 to register via text  Melinda [name] - following are the goals we discussed in your visit today:   Goals Addressed             This Visit's Progress    Protect My Health        Timeframe:  Long-Range Goal Priority:  Medium Start Date:    07/14/20                         Expected End Date:  ongoing          Follow Up Date: 05/16/23 - schedule appointment for vaccines needed due to my age or health - schedule recommended health tests (blood work, mammogram, colonoscopy, pap test) -  schedule and keep appointment for annual check-up  04/15/23:  Up to date on all aptts, has upcoming DERM appt   Patient verbalizes understanding of instructions and care plan provided today and agrees to view in MyChart. Active MyChart status and patient understanding of how to access instructions and care plan via MyChart confirmed with patient.     The Managed Medicaid care management team will reach out to the patient again over the next 30 business  days.  The  Patient has been provided with contact information for the Managed Medicaid care management team and has been advised to call with any health related questions or concerns.   [name], BSN [place]  [facility] Care Management Coordinator - Managed Medicaid High Risk [phone]   Following is a copy of your plan of care:  Care Plan : General Plan of Care (Adult)  Updates made by [name], RN since 04/15/2023 12:00 AM     Problem: Health Promotion or Disease Self-Management (General Plan of Care)   Priority: Medium  Onset Date: 10/20/2020     Long-Range Goal: Chronic Disease Management   Start Date: 07/14/2020  Expected End Date: 07/16/2023  Recent Progress: On track  Priority: Medium  Note:    Current Barriers:  Chronic Disease Management support and education needs related to DM, HTN, chronic pain, migraines, asthma, HLD, anxiety, depression Patient  with anxiety and depression-has appointment with Psychiatrist  scheduled every 3 months, therapist every 2 weeks 04/15/23:  BG -130s and BP stable. Attending therapy.  Seen in ED 8/26 and 9/28 for cystitis and kidney stones-finished medication-pain decreased.  DERM 10/31.   Nurse Case Manager Clinical Goal(s):  Over the next 90 days, patient will attend all scheduled medical appointments:  Over the next 30 days, patient will work with CM team pharmacist to review medications  Interventions:  Inter-disciplinary care team collaboration (see  longitudinal plan of care) Evaluation of current treatment plan  and patient's adherence to plan as established by provider. Reviewed medications with patient. Collaborated with pharmacy regarding medications. Discussed plans with patient for ongoing care management follow up and provided patient with direct contact information for care management team Reviewed scheduled/upcoming provider appointments. Collaborated with SW for psychotherapy referral. SW referral for psychotherapy provider-completed Pharmacy referral for medication review-completed Collaborated with PCP for new BP cuff-completed  Asthma: (Status:New goal.) Long Term Goal Advised patient to track and manage Asthma triggers Provided education about and advised patient to utilize infection prevention strategies to reduce risk of respiratory infection Discussed the importance of adequate rest and management of fatigue with Asthma Screening for signs and symptoms of depression related to chronic disease state  Assessed social determinant of health barriers   Diabetes Interventions:  (Status:  New goal.) Long Term Goal Assessed patient's understanding of A1c goal: <7% Reviewed medications with patient and discussed importance of medication adherence Discussed plans with patient for ongoing care management follow up and provided patient with direct contact information for care management team Reviewed scheduled/upcoming provider appointments  Advised patient, providing education and rationale, to check cbg as directed  and record, calling provider  for findings outside established parameters Review of patient status, including review of consultants reports, relevant laboratory and other test results, and medications completed Screening for signs and symptoms of depression related to chronic disease state  Assessed social determinant of health barriers Lab Results  Component Value Date         HGBA1C                                                     6.7 02/27/23       HGBA1C                                                      6.2                                    09/26/22  Hyperlipidemia Interventions:  (Status:  New goal.) Long Term Goal Medication review performed; medication list updated in electronic medical record.  Provider established cholesterol goals reviewed Counseled on importance of regular laboratory monitoring as prescribed Reviewed importance of limiting foods high in cholesterol Screening for signs and symptoms of depression related to chronic disease state Assessed social determinant of health barriers   Hypertension Interventions:  (Status:  New goal.) Long Term Goal Last practice recorded BP readings:  BP Readings from Last 3 Encounters:     04/15/23  130/80 11/08/22:        140/68 01/06/23         100/64  Most recent eGFR/CrCl:  Lab Results  Component Value Date   EGFR 68 12/19/2020    No components found for: "CRCL"  Evaluation of current treatment plan related to hypertension self management and patient's adherence to plan as established by provider Reviewed medications with patient and discussed importance of compliance Discussed plans with patient for ongoing care management follow up and provided patient with direct contact information for care management team Advised patient, providing education and rationale, to monitor blood pressure daily and record, calling PCP for findings outside established parameters Reviewed scheduled/upcoming provider appointments including:  Screening for signs and symptoms of depression related to chronic disease state  Assessed social determinant of health barriers  Pain Interventions:  (Status:  New goal.) Long Term Goal Pain assessment performed Medications reviewed Reviewed provider established plan for pain management Discussed importance of adherence to all scheduled medical appointments Counseled on the importance of reporting any/all  new or changed pain symptoms or management strategies to pain management provider Advised patient to report to care team affect of pain on daily activities Reviewed with patient prescribed pharmacological and nonpharmacological pain relief strategies Screening for signs and symptoms of depression related to chronic disease state  Assessed social determinant of health barriers   Self Care Activities: Over the next 90 days, patient will:  -Self administers medications as prescribed Attends all scheduled provider appointments Calls pharmacy for medication refills Calls provider office for new concerns or questions Patient will locate meter to check blood sugars-completed Schedule eye provider appt.-completed Follow up with provider regarding blood sugars-completed  Follow Up Plan: The Managed Medicaid care management team will reach out to the patient again over the next 30  business days.  The patient has been provided with contact information for the Managed Medicaid care management team and has been advised to call with any health related questions or concerns.

## 2023-04-15 NOTE — Patient Outreach (Signed)
Medicaid Managed Care   Nurse Care Manager Note  04/15/2023 Name:  Melinda [name] MRN:  [id number] DOB:  04-Sep-1965  Melinda [name] is an 57 y.o. year old female who is a primary patient of [place], [place], [place].  The [facility] Managed Care Coordination team was consulted for assistance with:    Chronic healthcare management needs, h/o thyroid cancer, DM, HTN, chronic pain, migraines, anxiety/depression/PTSD, h/o PE, COPD, asthma, SS, adrenal myolipoma  Melinda [name] was given information about Medicaid Managed Care Coordination team services today. Melinda [name] Patient agreed to services and verbal consent obtained.  Engaged with patient by telephone for follow up visit in response to provider referral for case management and/or care coordination services.   Assessments/Interventions:  Review of past medical history, allergies, medications, health status, including review of consultants reports, laboratory and other test data, was performed as part of comprehensive evaluation and provision of chronic care management services.  SDOH (Social Determinants of Health) assessments and interventions performed: SDOH Interventions    Flowsheet Row Patient Outreach Telephone from 04/15/2023 in [place] POPULATION HEALTH DEPARTMENT Patient Outreach Telephone from 04/09/2023 in [place] POPULATION HEALTH DEPARTMENT Patient Outreach Telephone from 03/18/2023 in [place] POPULATION HEALTH DEPARTMENT Patient Outreach Telephone from 02/18/2023 in [place] POPULATION HEALTH DEPARTMENT Patient Outreach Telephone from 02/14/2023 in [place] POPULATION HEALTH DEPARTMENT Patient Outreach Telephone from 01/21/2023 in [place] POPULATION HEALTH DEPARTMENT  SDOH Interventions        Food Insecurity Interventions -- -- -- -- Intervention Not Indicated --  Housing Interventions Intervention Not Indicated -- Intervention Not Indicated -- -- --  Utilities Interventions Intervention Not Indicated -- -- -- -- --   Alcohol Usage Interventions -- -- -- Intervention Not Indicated (Score <7) -- --  Depression Interventions/Treatment  -- -- -- -- Medication, Currently on Treatment, Counseling  [in process of establishing with a new therapist/counselor due to insurance.] --  Physical Activity Interventions -- -- -- Intervention Not Indicated  [not able to perform this type of exercise] -- --  Stress Interventions -- [employer], Provide Counseling Offered [employer], Provide Counseling -- -- Provide Counseling, Warehouse [employer] [facility]     Care Plan Allergies  Allergen Reactions   Ceftriaxone Anaphylaxis and Other (See Comments)    ROCEPHIN   Metformin Anaphylaxis    ALL   Penicillins Shortness Of Breath    Has patient had a PCN reaction causing immediate rash, facial/tongue/throat swelling, SOB or lightheadedness with hypotension: Yes Has patient had a PCN reaction causing severe rash involving mucus membranes or skin necrosis: No Has patient had a PCN reaction that required hospitalization No Has patient had a PCN reaction occurring within the last 10 years: No If all of the above answers are "NO", then may proceed with Cephalosporin use.    Shellfish Allergy Anaphylaxis   Citrus Rash   Flonase [Fluticasone Propionate]     Makes migraines worse   Gold-Containing Drug Products     HANDS ITCH   Metformin Hcl Other (See Comments)   Nickel     HANDS SWELL   Penicillin G Other (See Comments)   Medications Reviewed Today     Reviewed by Melinda Chandler, RN (Registered Nurse) on 04/15/23 at 1548  Med List Status: <None>   Medication Order Taking? Sig Documenting Provider Last Dose Status Informant  albuterol (PROVENTIL) (2.5 MG/3ML) 0.083% nebulizer solution [id number]  Take 3 mLs (2.5 mg total) by nebulization every 6 (six)  hours as needed for wheezing or shortness of breath. [name], MD  Active Self, Pharmacy Records  albuterol  (VENTOLIN HFA) [phone] Base) MCG/ACT inhaler [id number]  Inhale 2 puffs into the lungs every 6 (six) hours as needed for wheezing or shortness of breath. [name], MD  Active Self, Pharmacy Records  apixaban (ELIQUIS) 5 MG TABS tablet [id number]  TAKE ONE TABLET BY MOUTH TWICE DAILY MORNING AND [name], MD  Active Self, Pharmacy Records  atorvastatin (LIPITOR) 20 MG tablet [id number]  TAKE ONE TABLET BY MOUTH ONCE DAILY AT BEDTIME [name], MD  Active Self, Pharmacy Records  budesonide-formoterol [facility]) 160-4.5 MCG/ACT inhaler [id number]  Inhale 2 puffs into the lungs 2 (two) times daily. [name], MD  Active Self, Pharmacy Records  buPROPion (WELLBUTRIN XL) 300 MG 24 hr tablet [id number]  Take 300 mg by mouth daily.  [provider]  Active Self, Pharmacy Records  busPIRone (BUSPAR) 10 MG tablet [id number]  Take 10 mg by mouth 2 (two) times daily. [provider]  Active Self, Pharmacy Records           Med Note [name], [facility]   Wed Feb 18, 2018 10:03 PM)    Cholecalciferol (VITAMIN D3) 10 MCG (400 UNIT) CAPS [id number]  TAKE ONE CAPSULE BY MOUTH ONCE DAILY (AM) [name], MD  Active Self, Pharmacy Records  cyclobenzaprine (FLEXERIL) 10 MG tablet [id number]  TAKE 1 TABLET (10 MG TOTAL) BY MOUTH 3 (THREE) TIMES DAILY AS NEEDED FOR MUSCLE SPASMS. [name], MD  Active Self, Pharmacy Records  doxepin [facility]) 50 MG capsule [id number]  Take 150 mg by mouth at bedtime. [provider]  Active Self, Pharmacy Records  gabapentin (NEURONTIN) 300 MG capsule [id number]  TAKE ONE CAPSULE BY MOUTH THREE TIMES DAILY (AM+NOON+BEDTIME) [name], MD  Active Self, Pharmacy Records  ibuprofen (ADVIL) 600 MG tablet [id number]  Take 1 tablet (600 mg total) by mouth every 6 (six) hours as needed. [name], [place]  Active   levothyroxine (SYNTHROID) 137 MCG tablet [id number]  Take 274 mcg by mouth daily before breakfast.  [provider]  Active Self, Pharmacy Records  loratadine (CLARITIN) 10 MG tablet [id number]  TAKE 1 TABLET (10 MG TOTAL) BY MOUTH DAILY (AM) [name], MD  Active Self, Pharmacy Records  metoprolol tartrate (LOPRESSOR) 50 MG tablet [id number]  TAKE ONE TABLET BY MOUTH TWICE DAILY (AM+BEDTIME)  Patient taking differently: Take 75 mg by mouth 2 (two) times daily. TAKE ONE TABLET BY MOUTH TWICE DAILY (AM+BEDTIME)   [name], MD  Active Self, Pharmacy Records  montelukast (SINGULAIR) 10 MG tablet [id number]  TAKE 1 TABLET BY MOUTH EVERY EVENING AT BEDTIME [name], MD  Active Self, Pharmacy Records  nitrofurantoin, macrocrystal-monohydrate, (MACROBID) 100 MG capsule [id number] No Take 1 capsule (100 mg total) by mouth 2 (two) times daily.  Patient not taking: Reported on 04/15/2023   [name], [place] Not Taking Active   nystatin-triamcinolone ointment [facility]) [id number]  Apply 1 Application topically 2 (two) times daily. Apply topically two times daily for five days Jermari Tamargo Piedra, DO  Active Self, Pharmacy Records  ondansetron (ZOFRAN-ODT) 4 MG disintegrating tablet [id number]  Take 1 tablet (4 mg total) by mouth every 8 (eight) hours as needed for nausea or vomiting. [name], [place]  Active   prazosin (MINIPRESS) 2 MG capsule [id number]  Take 6 mg by mouth at bedtime. [provider]  Active Self, Pharmacy  Records  Semaglutide, 1 MG/DOSE, 2 MG/1.5ML SOPN [id number]  Inject 2 mg into the skin once a week. [provider]  Active Self, Pharmacy Records  topiramate (TOPAMAX) 200 MG tablet [id number]  Take 1 tablet (200 mg total) by mouth at bedtime. [name], DO  Active Self, Pharmacy Records  VRAYLAR 3 MG capsule [id number]  Take 3 mg by mouth daily. [provider]  Active Self, Pharmacy Records           Patient Active Problem List   Diagnosis Date Noted   Postoperative hypothyroidism 01/21/2022   Esophageal dysphagia 01/21/2022    History of DVT (deep vein thrombosis) 01/21/2022   History of malignant neoplasm of thyroid 01/21/2022   Long term (current) use of anticoagulants 01/21/2022   Migraine without aura, not refractory 01/21/2022   Type 2 diabetes mellitus with hyperglycemia (HCC) 01/21/2022   Spinal stenosis excluding cervical region 11/21/2021   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   Abdominal pannus 08/21/2021   Hardening of the aorta (main artery of the heart) (HCC) 08/21/2021   At high risk for falls 04/20/2020   Hyperlipidemia 02/16/2020   Adrenal mass, right (HCC) 04/29/2019   Chronic cough 03/12/2018   Left shoulder pain 10/14/2016   Primary hypertension 10/14/2016   S/P thyroidectomy 06/27/2016   Chronic bilateral low back pain without sciatica 04/22/2016   Liver lesion 12/24/2015   Malignant tumor of thyroid gland (HCC) 11/16/2015   Controlled type 2 diabetes mellitus with complication, with long-term current use of insulin (HCC) 10/10/2015   Vision loss 08/25/2015   Decreased vision in both eyes 08/25/2015   Prolonged Q-T interval on ECG 08/19/2015   History of pulmonary embolism 08/10/2015   Mild persistent asthma 04/13/2015   Nonruptured cerebral aneurysm 04/13/2015   Fibroid, uterine    Morbid obesity (HCC) 11/11/2014   Atypical chest pain 11/08/2014   Sinus tachycardia 11/08/2014   Migraine 10/14/2014   Menorrhagia 09/08/2014   Conditions to be addressed/monitored per PCP order:  Chronic healthcare management needs, h/o thyroid cancer, DM, HTN, chronic pain, migraines, anxiety/depression/PTSD, h/o PE, COPD, asthma, SS, adrenal myolipoma  Care Plan : General Plan of Care (Adult)  Updates made by Melinda Chandler, RN since 04/15/2023 12:00 AM     Problem: Health Promotion or Disease Self-Management (General Plan of Care)   Priority: Medium  Onset Date: 10/20/2020     Long-Range Goal: Chronic Disease Management   Start Date: 07/14/2020  Expected End Date: 07/16/2023   Recent Progress: On track  Priority: Medium  Note:    Current Barriers:  Chronic Disease Management support and education needs related to DM, HTN, chronic pain, migraines, asthma, HLD, anxiety, depression Patient with anxiety and depression-has appointment with Psychiatrist  scheduled every 3 months, therapist every 2 weeks 04/15/23:  BG -130s and BP stable. Attending therapy.  Seen in ED 8/26 and 9/28 for cystitis and kidney stones-finished medication-pain decreased.  DERM 10/31.   Nurse Case Manager Clinical Goal(s):  Over the next 90 days, patient will attend all scheduled medical appointments:  Over the next 30 days, patient will work with CM team pharmacist to review medications  Interventions:  Inter-disciplinary care team collaboration (see longitudinal plan of care) Evaluation of current treatment plan  and patient's adherence to plan as established by provider. Reviewed medications with patient. Collaborated with pharmacy regarding medications. Discussed plans with patient for ongoing care management follow up and provided patient with direct contact information for care management team Reviewed scheduled/upcoming  provider appointments. Collaborated with SW for psychotherapy referral. SW referral for psychotherapy provider-completed Pharmacy referral for medication review-completed Collaborated with PCP for new BP cuff-completed  Asthma: (Status:New goal.) Long Term Goal Advised patient to track and manage Asthma triggers Provided education about and advised patient to utilize infection prevention strategies to reduce risk of respiratory infection Discussed the importance of adequate rest and management of fatigue with Asthma Screening for signs and symptoms of depression related to chronic disease state  Assessed social determinant of health barriers   Diabetes Interventions:  (Status:  New goal.) Long Term Goal Assessed patient's understanding of A1c goal: <7% Reviewed  medications with patient and discussed importance of medication adherence Discussed plans with patient for ongoing care management follow up and provided patient with direct contact information for care management team Reviewed scheduled/upcoming provider appointments  Advised patient, providing education and rationale, to check cbg as directed  and record, calling provider  for findings outside established parameters Review of patient status, including review of consultants reports, relevant laboratory and other test results, and medications completed Screening for signs and symptoms of depression related to chronic disease state  Assessed social determinant of health barriers Lab Results  Component Value Date         HGBA1C                                                    6.7 02/27/23       HGBA1C                                                      6.2                                    09/26/22  Hyperlipidemia Interventions:  (Status:  New goal.) Long Term Goal Medication review performed; medication list updated in electronic medical record.  Provider established cholesterol goals reviewed Counseled on importance of regular laboratory monitoring as prescribed Reviewed importance of limiting foods high in cholesterol Screening for signs and symptoms of depression related to chronic disease state Assessed social determinant of health barriers   Hypertension Interventions:  (Status:  New goal.) Long Term Goal Last practice recorded BP readings:  BP Readings from Last 3 Encounters:     04/15/23         130/80 11/08/22:        140/68 01/06/23         100/64  Most recent eGFR/CrCl:  Lab Results  Component Value Date   EGFR 68 12/19/2020    No components found for: "CRCL"  Evaluation of current treatment plan related to hypertension self management and patient's adherence to plan as established by provider Reviewed medications with patient and discussed importance of  compliance Discussed plans with patient for ongoing care management follow up and provided patient with direct contact information for care management team Advised patient, providing education and rationale, to monitor blood pressure daily and record, calling PCP for findings outside established parameters Reviewed scheduled/upcoming provider appointments including:  Screening for signs and symptoms of depression related to chronic disease state  Assessed social determinant of health barriers  Pain Interventions:  (Status:  New goal.) Long Term Goal Pain assessment performed Medications reviewed Reviewed provider established plan for pain management Discussed importance of adherence to all scheduled medical appointments Counseled on the importance of reporting any/all new or changed pain symptoms or management strategies to pain management provider Advised patient to report to care team affect of pain on daily activities Reviewed with patient prescribed pharmacological and nonpharmacological pain relief strategies Screening for signs and symptoms of depression related to chronic disease state  Assessed social determinant of health barriers   Self Care Activities: Over the next 90 days, patient will:  -Self administers medications as prescribed Attends all scheduled provider appointments Calls pharmacy for medication refills Calls provider office for new concerns or questions Patient will locate meter to check blood sugars-completed Schedule eye provider appt.-completed Follow up with provider regarding blood sugars-completed  Follow Up Plan: The Managed Medicaid care management team will reach out to the patient again over the next 30  business days.  The patient has been provided with contact information for the Managed Medicaid care management team and has been advised to call with any health related questions or concerns.    Follow Up:  Patient agrees to Care Plan and  Follow-up.  Plan: The Managed Medicaid care management team will reach out to the patient again over the next 30 business  days. and The  Patient has been provided with contact information for the Managed Medicaid care management team and has been advised to call with any health related questions or concerns.  Date/time of next scheduled RN care management/care coordination outreach:  05/16/23

## 2023-04-16 DIAGNOSIS — R269 Unspecified abnormalities of gait and mobility: Secondary | ICD-10-CM | POA: Diagnosis not present

## 2023-04-17 ENCOUNTER — Other Ambulatory Visit: Payer: Self-pay

## 2023-04-17 ENCOUNTER — Telehealth: Payer: Self-pay | Admitting: Cardiovascular Disease

## 2023-04-17 DIAGNOSIS — R269 Unspecified abnormalities of gait and mobility: Secondary | ICD-10-CM | POA: Diagnosis not present

## 2023-04-17 MED ORDER — AIMOVIG 140 MG/ML ~~LOC~~ SOAJ: 140.0000 mg | SUBCUTANEOUS | 5 refills | Status: DC

## 2023-04-17 NOTE — Telephone Encounter (Signed)
*  STAT* If patient is at the pharmacy, call can be transferred to refill team.   1. Which medications need to be refilled? (please list name of each medication and dose if known)   apixaban (ELIQUIS) 5 MG TABS tablet   2. Would you like to learn more about the convenience, safety, & potential cost savings by using the [facility]?   3. Are you open to using the [facility] (Type [facility]. ).  4. Which pharmacy/location (including street and city if local pharmacy) is medication to be sent to?  [facility] - [place], [place] - [address]   5. Do they need a 30 day or 90 day supply?   30 day  Caller [facility]) stated patient is completely out of this medication.

## 2023-04-17 NOTE — Progress Notes (Signed)
Per last ov 01/06/23:  Migraine prevention:  Aimovig 140mg , topiramate 200mg  at bedtime.    Aimovig 140 mg refilled to [facility],

## 2023-04-17 NOTE — Telephone Encounter (Signed)
Eliquis 5mg  refill request received. Patient is 57 years old, weight-140.8kg, Crea-1.12 on 03/29/23, Diagnosis-PE, and last seen by Dr. Allyson Sabal on 02/16/20-NEEDS APPT. Dose is appropriate based on dosing criteria.   Pt is overdue to see the Cardiologist. Eliquis refills have been filled by Dr. Alvis Lemmings on 08/26/22  Called pt in reference to this refill and she states her primary doctor fills this and she will contact them. She states she does not need an appt with Cardiology, advised will deny this refill.   Called the pharmacy and spoke with Pharmacist and advised that the eliquis refill will need to be sent to PCP office and he will need to contact them regarding this refill.

## 2023-04-18 DIAGNOSIS — R269 Unspecified abnormalities of gait and mobility: Secondary | ICD-10-CM | POA: Diagnosis not present

## 2023-04-21 DIAGNOSIS — R269 Unspecified abnormalities of gait and mobility: Secondary | ICD-10-CM | POA: Diagnosis not present

## 2023-04-22 DIAGNOSIS — R269 Unspecified abnormalities of gait and mobility: Secondary | ICD-10-CM | POA: Diagnosis not present

## 2023-04-23 DIAGNOSIS — R269 Unspecified abnormalities of gait and mobility: Secondary | ICD-10-CM | POA: Diagnosis not present

## 2023-04-24 DIAGNOSIS — R269 Unspecified abnormalities of gait and mobility: Secondary | ICD-10-CM | POA: Diagnosis not present

## 2023-04-25 DIAGNOSIS — R269 Unspecified abnormalities of gait and mobility: Secondary | ICD-10-CM | POA: Diagnosis not present

## 2023-05-01 ENCOUNTER — Ambulatory Visit: Payer: Medicaid Other | Admitting: Dermatology

## 2023-05-01 ENCOUNTER — Other Ambulatory Visit: Payer: Self-pay | Admitting: Internal Medicine

## 2023-05-01 ENCOUNTER — Encounter: Payer: Self-pay | Admitting: Dermatology

## 2023-05-01 DIAGNOSIS — Z1231 Encounter for screening mammogram for malignant neoplasm of breast: Secondary | ICD-10-CM

## 2023-05-01 DIAGNOSIS — L219 Seborrheic dermatitis, unspecified: Secondary | ICD-10-CM | POA: Diagnosis not present

## 2023-05-01 DIAGNOSIS — L853 Xerosis cutis: Secondary | ICD-10-CM

## 2023-05-01 MED ORDER — KETOCONAZOLE 2 % EX CREA: 1.0000 | TOPICAL_CREAM | Freq: Every day | CUTANEOUS | 0 refills | Status: AC

## 2023-05-01 NOTE — Progress Notes (Signed)
   Follow-Up Visit   Subjective  Melinda [name] is a 57 y.o. female who presents for the following: [facility] D[facility]  Patient present today for follow up visit for follow up. Patient was last evaluated on 09/17/22. She was Rx'd nystatin-triamcinolone ointment for problem areas. Patient reports sxs are better. She tried Eucerin advanced repair lotion that was recommended however she D/C'd as she reports she was still experiencing some dryness. Patient reports medication changes, she is now taking Oxybutin for OAB, and Metoprolol is now 75mg .  The following portions of the chart were reviewed this encounter and updated as appropriate: medications, allergies, medical history  Review of Systems:  No other skin or systemic complaints except as noted in HPI or Assessment and Plan.  Objective  Well appearing patient in no apparent distress; mood and affect are within normal limits.    A focused examination was performed of the following areas: face   Relevant exam findings are noted in the Assessment and Plan.                 Assessment & Plan   1. Seborrheic Dermatitis - Assessment: Current flare on the right cheek, eyelids, eyebrows, and affected areas. - Plan: Continue nystatin triamcinolone cream for 1 week. Prescribe ketoconazole cream, apply to affected areas on face BID for 3 weeks. Discontinue use of exfoliating soap and switch to a gentle moisturizing soap. Provide samples of face washes (e.g., La Roche-Posay) for the patient to try and choose the preferred one for daily use. Provide samples of moisturizers for daily use when not using ketoconazole cream. Follow-up in 3 months to assess the effectiveness of the treatment plan and determine the trend of the patient's skin condition.  2. Dry Skin - Assessment: Patient has dry skin. - Plan: Advise the patient to use a regular moisturizing soap instead of an exfoliant. Encourage daily use of moisturizers provided in samples. Educate the  patient on the importance of maintaining skin hydration, especially during colder months with low humidity.  Seborrheic dermatitis  Related Medications nystatin-triamcinolone ointment (MYCOLOG) Apply 1 Application topically 2 (two) times daily. Apply topically two times daily for five days  Dry skin    Return in about 3 months (around 08/01/2023).    Documentation: I have reviewed the above documentation for accuracy and completeness, and I agree with the above.   I, Shirron Marcha Solders, CMA, am acting as scribe for [employer], DO.   Langston Reusing, DO

## 2023-05-01 NOTE — Patient Instructions (Addendum)
Hello Jaicey,  Thank you for visiting [place] today. We appreciate your commitment to improving your skin health. Here is a summary of the key instructions from today's consultation:  - Nystatin Triamcinolone Cream: Continue using the Nystatin Triamcinolone cream for one more week on the affected areas. After the 1 week switch to the new Ketoconazole cream.  - Ketoconazole Cream:   - Application: Start using Ketoconazole cream twice daily for three weeks on the affected areas.   - Pause: After three weeks, take a break from this medication.  - Soap: Avoid exfoliating soaps and switch to a regular moisturizing soap for daily use.  - Moisturizing Samples: Use the moisturizing samples provided and choose one that suits you best for daily use.  - Follow-Up: We have scheduled a follow-up appointment in three months to assess the effectiveness of the treatment and adjust as necessary.  We have sent the prescription for the Ketoconazole cream to your pharmacy. Please ensure you have enough Nystatin Triamcinolone at home for the upcoming week.  We look forward to seeing the progress at your next appointment. If you have any questions or concerns before then, please do not hesitate to contact our office.  Best regards,  Dr. Langston [facility] Important Information  Due to recent changes in healthcare laws, you may see results of your pathology and/or laboratory studies on MyChart before the doctors have had a chance to review them. We understand that in some cases there may be results that are confusing or concerning to you. Please understand that not all results are received at the same time and often the doctors may need to interpret multiple results in order to provide you with the best plan of care or course of treatment. Therefore, we ask that you please give [place] 2 business days to thoroughly review all your results before contacting the office for clarification. Should we see a critical lab  result, you will be contacted sooner.   If You Need Anything After Your Visit  If you have any questions or concerns for your doctor, please call our main line at [phone] If no one answers, please leave a voicemail as directed and we will return your call as soon as possible. Messages left after 4 pm will be answered the following business day.   You may also send [place] a message via MyChart. We typically respond to MyChart messages within 1-2 business days.  For prescription refills, please ask your pharmacy to contact our office. Our fax number is [phone].  If you have an urgent issue when the clinic is closed that cannot wait until the next business day, you can page your doctor at the number below.    Please note that while we do our best to be available for urgent issues outside of office hours, we are not available 24/7.   If you have an urgent issue and are unable to reach [place], you may choose to seek medical care at your doctor's office, retail clinic, urgent care center, or emergency room.  If you have a medical emergency, please immediately call 911 or go to the emergency department. In the event of inclement weather, please call our main line at [phone] for an update on the status of any delays or closures.  Dermatology Medication Tips: Please keep the boxes that topical medications come in in order to help keep track of the instructions about where and how to use these. Pharmacies typically print the medication instructions only on the boxes  and not directly on the medication tubes.   If your medication is too expensive, please contact our office at [phone] or send [place] a message through MyChart.   We are unable to tell what your co-pay for medications will be in advance as this is different depending on your insurance coverage. However, we may be able to find a substitute medication at lower cost or fill out paperwork to get insurance to cover a needed medication.    If a prior authorization is required to get your medication covered by your insurance company, please allow [place] 1-2 business days to complete this process.  Drug prices often vary depending on where the prescription is filled and some pharmacies may offer cheaper prices.  The website www.goodrx.com contains coupons for medications through different pharmacies. The prices here do not account for what the cost may be with help from insurance (it may be cheaper with your insurance), but the website can give you the price if you did not use any insurance.  - You can print the associated coupon and take it with your prescription to the pharmacy.  - You may also stop by our office during regular business hours and pick up a GoodRx coupon card.  - If you need your prescription sent electronically to a different pharmacy, notify our office through [facility] or by phone at [phone]

## 2023-05-02 DIAGNOSIS — R269 Unspecified abnormalities of gait and mobility: Secondary | ICD-10-CM | POA: Diagnosis not present

## 2023-05-05 DIAGNOSIS — R269 Unspecified abnormalities of gait and mobility: Secondary | ICD-10-CM | POA: Diagnosis not present

## 2023-05-06 DIAGNOSIS — R269 Unspecified abnormalities of gait and mobility: Secondary | ICD-10-CM | POA: Diagnosis not present

## 2023-05-06 DIAGNOSIS — F4325 Adjustment disorder with mixed disturbance of emotions and conduct: Secondary | ICD-10-CM | POA: Diagnosis not present

## 2023-05-06 DIAGNOSIS — G4733 Obstructive sleep apnea (adult) (pediatric): Secondary | ICD-10-CM | POA: Diagnosis not present

## 2023-05-07 DIAGNOSIS — R269 Unspecified abnormalities of gait and mobility: Secondary | ICD-10-CM | POA: Diagnosis not present

## 2023-05-07 DIAGNOSIS — F4310 Post-traumatic stress disorder, unspecified: Secondary | ICD-10-CM | POA: Diagnosis not present

## 2023-05-07 DIAGNOSIS — F411 Generalized anxiety disorder: Secondary | ICD-10-CM | POA: Diagnosis not present

## 2023-05-07 DIAGNOSIS — F331 Major depressive disorder, recurrent, moderate: Secondary | ICD-10-CM | POA: Diagnosis not present

## 2023-05-08 DIAGNOSIS — R269 Unspecified abnormalities of gait and mobility: Secondary | ICD-10-CM | POA: Diagnosis not present

## 2023-05-09 DIAGNOSIS — R269 Unspecified abnormalities of gait and mobility: Secondary | ICD-10-CM | POA: Diagnosis not present

## 2023-05-12 DIAGNOSIS — R269 Unspecified abnormalities of gait and mobility: Secondary | ICD-10-CM | POA: Diagnosis not present

## 2023-05-13 DIAGNOSIS — R269 Unspecified abnormalities of gait and mobility: Secondary | ICD-10-CM | POA: Diagnosis not present

## 2023-05-13 DIAGNOSIS — G4733 Obstructive sleep apnea (adult) (pediatric): Secondary | ICD-10-CM | POA: Diagnosis not present

## 2023-05-14 DIAGNOSIS — R269 Unspecified abnormalities of gait and mobility: Secondary | ICD-10-CM | POA: Diagnosis not present

## 2023-05-15 DIAGNOSIS — R269 Unspecified abnormalities of gait and mobility: Secondary | ICD-10-CM | POA: Diagnosis not present

## 2023-05-16 ENCOUNTER — Other Ambulatory Visit: Payer: Self-pay | Admitting: Obstetrics and Gynecology

## 2023-05-16 DIAGNOSIS — R269 Unspecified abnormalities of gait and mobility: Secondary | ICD-10-CM | POA: Diagnosis not present

## 2023-05-16 NOTE — Patient Outreach (Signed)
Medicaid Managed Care   Nurse Care Manager Note  05/16/2023 Name:  Melinda [name] MRN:  [id number] DOB:  [birth year]  Melinda [name] is an 57 y.o. year old female who is a primary patient of [place], [place], [place].  The [facility] Managed Care Coordination team was consulted for assistance with:    Chronic healthcare management needs, DM, HTN, chronic pain, migraines, anxiety/depression/PTSD, h/o PE, COPD/asthma, SS, adrenal myolipoma, dermatitis  Ms. Blanke was given information about Medicaid Managed Care Coordination team services today. Melinda [name] Patient agreed to services and verbal consent obtained.  Engaged with patient by telephone for follow up visit in response to provider referral for case management and/or care coordination services.   Assessments/Interventions:  Review of past medical history, allergies, medications, health status, including review of consultants reports, laboratory and other test data, was performed as part of comprehensive evaluation and provision of chronic care management services.  SDOH (Social Determinants of Health) assessments and interventions performed: SDOH Interventions    Flowsheet Row Patient Outreach Telephone from 05/16/2023 in [place] POPULATION HEALTH DEPARTMENT Patient Outreach Telephone from 04/15/2023 in [place] POPULATION HEALTH DEPARTMENT Patient Outreach Telephone from 04/09/2023 in [place] POPULATION HEALTH DEPARTMENT Patient Outreach Telephone from 03/18/2023 in [place] POPULATION HEALTH DEPARTMENT Patient Outreach Telephone from 02/18/2023 in [place] POPULATION HEALTH DEPARTMENT Patient Outreach Telephone from 02/14/2023 in  POPULATION HEALTH DEPARTMENT  SDOH Interventions        Food Insecurity Interventions -- -- -- -- -- Intervention Not Indicated  Housing Interventions -- Intervention Not Indicated -- Intervention Not Indicated -- --  Transportation Interventions Intervention Not Indicated -- -- -- -- --   Utilities Interventions -- Intervention Not Indicated -- -- -- --  Alcohol Usage Interventions -- -- -- -- Intervention Not Indicated (Score <7) --  Depression Interventions/Treatment  -- -- -- -- -- Medication, Currently on Treatment, Counseling  [in process of establishing with a new therapist/counselor due to insurance.]  Financial Strain Interventions Intervention Not Indicated -- -- -- -- --  Physical Activity Interventions -- -- -- -- Intervention Not Indicated  [not able to perform this type of exercise] --  Stress Interventions -- -- [employer], Provide Counseling Offered [employer], Provide Counseling -- --     Care Plan Allergies  Allergen Reactions   Ceftriaxone Anaphylaxis and Other (See Comments)    ROCEPHIN   Metformin Anaphylaxis    ALL   Penicillins Shortness Of Breath    Has patient had a PCN reaction causing immediate rash, facial/tongue/throat swelling, SOB or lightheadedness with hypotension: Yes Has patient had a PCN reaction causing severe rash involving mucus membranes or skin necrosis: No Has patient had a PCN reaction that required hospitalization No Has patient had a PCN reaction occurring within the last 10 years: No If all of the above answers are "NO", then may proceed with Cephalosporin use.    Shellfish Allergy Anaphylaxis   Shellfish-Derived Products Anaphylaxis   Citrus Rash   Flonase [Fluticasone Propionate]     Makes migraines worse   Gold-Containing Drug Products     HANDS ITCH   Metformin Hcl Other (See Comments)   Nickel     HANDS SWELL   Penicillin G Other (See Comments)   Medications Reviewed Today     Reviewed by [name], RN (Registered Nurse) on 05/16/23 at 1509  Med List Status: <None>   Medication Order Taking? Sig Documenting Provider Last Dose Status Informant  albuterol (PROVENTIL) (2.5 MG/3ML) 0.083% nebulizer solution [id number] No Take 3 mLs (2.5 mg total) by nebulization every  6 (six) hours as needed for wheezing or shortness of breath. [name], MD Taking Active Self, Pharmacy Records  albuterol (VENTOLIN HFA) [phone] Base) MCG/ACT inhaler [id number] No Inhale 2 puffs into the lungs every 6 (six) hours as needed for wheezing or shortness of breath. [name], MD Taking Active Self, Pharmacy Records  apixaban (ELIQUIS) 5 MG TABS tablet [id number] No TAKE ONE TABLET BY MOUTH TWICE DAILY MORNING AND [name], MD Taking Active Self, Pharmacy Records  atorvastatin (LIPITOR) 20 MG tablet [id number] No TAKE ONE TABLET BY MOUTH ONCE DAILY AT BEDTIME [name], MD Taking Active Self, Pharmacy Records  budesonide-formoterol [facility]) 160-4.5 MCG/ACT inhaler [id number] No Inhale 2 puffs into the lungs 2 (two) times daily. [name], MD Taking Active Self, Pharmacy Records  buPROPion (WELLBUTRIN XL) 300 MG 24 hr tablet [id number] No Take 300 mg by mouth daily.  [provider] Taking Active Self, Pharmacy Records  busPIRone (BUSPAR) 10 MG tablet [id number] No Take 10 mg by mouth 2 (two) times daily. [provider] Taking Active Self, Pharmacy Records           Med Note [name], [facility]   Wed Feb 18, 2018 10:03 PM)    Cholecalciferol (VITAMIN D3) 10 MCG (400 UNIT) CAPS [id number] No TAKE ONE CAPSULE BY MOUTH ONCE DAILY (AM) [name], MD Taking Active Self, Pharmacy Records  cyclobenzaprine (FLEXERIL) 10 MG tablet [id number] No TAKE 1 TABLET (10 MG TOTAL) BY MOUTH 3 (THREE) TIMES DAILY AS NEEDED FOR MUSCLE SPASMS. [name], MD Taking Active Self, Pharmacy Records  doxepin [facility]) 50 MG capsule [id number] No Take 150 mg by mouth at bedtime. [provider] Taking Active Self, Pharmacy Records  Erenumab-aooe (AIMOVIG) 140 MG/ML SOAJ [id number] No Inject 140 mg into the skin every 28 (twenty-eight) days. [name], DO Taking Active   gabapentin (NEURONTIN) 300 MG capsule [id number] No TAKE ONE  CAPSULE BY MOUTH THREE TIMES DAILY (AM+NOON+BEDTIME) [name], MD Taking Active Self, Pharmacy Records  ibuprofen (ADVIL) 600 MG tablet [id number] No Take 1 tablet (600 mg total) by mouth every 6 (six) hours as needed. [name], [place] Taking Active   ketoconazole (NIZORAL) 2 % cream [id number]  Apply 1 Application topically daily. Apply to affected areas on face twice a day for 3 weeks after using nystatin-triamcinolone for 1 week. Sameena Artus Piedra, DO  Active   levothyroxine (SYNTHROID) 137 MCG tablet [id number] No Take 274 mcg by mouth daily before breakfast. [provider] Taking Active Self, Pharmacy Records  loratadine (CLARITIN) 10 MG tablet [id number] No TAKE 1 TABLET (10 MG TOTAL) BY MOUTH DAILY (AM) [name], MD Taking Active Self, Pharmacy Records  metoprolol tartrate (LOPRESSOR) 50 MG tablet [id number] No TAKE ONE TABLET BY MOUTH TWICE DAILY (AM+BEDTIME)  Patient taking differently: Take 75 mg by mouth 2 (two) times daily. TAKE ONE TABLET BY MOUTH TWICE DAILY (AM+BEDTIME)   [name], MD Taking Active Self, Pharmacy Records  montelukast (SINGULAIR) 10 MG tablet [id number] No TAKE 1 TABLET BY MOUTH EVERY EVENING AT BEDTIME [name], MD Taking Active Self, Pharmacy Records  nitrofurantoin, macrocrystal-monohydrate, (MACROBID) 100 MG capsule [id number] No Take 1 capsule (100 mg total) by mouth 2 (two) times daily. [name], [place] Taking Active   nystatin-triamcinolone ointment [facility]) [id number] No Apply 1 Application topically 2 (two) times daily.  Apply topically two times daily for five days Nakul Avino Piedra, DO Taking Active Self, Pharmacy Records  ondansetron (ZOFRAN-ODT) 4 MG disintegrating tablet [id number] No Take 1 tablet (4 mg total) by mouth every 8 (eight) hours as needed for nausea or vomiting. [name], PA Taking Active   oxybutynin (DITROPAN-XL) 5 MG 24 hr tablet [id number]  Take 5 mg by mouth at bedtime. [provider]   Active   prazosin (MINIPRESS) 2 MG capsule [id number] No Take 6 mg by mouth at bedtime. [provider] Taking Active Self, Pharmacy Records  Semaglutide, 1 MG/DOSE, 2 MG/1.5ML SOPN [id number] No Inject 2 mg into the skin once a week. [provider] Taking Active Self, Pharmacy Records  topiramate (TOPAMAX) 200 MG tablet [id number] No Take 1 tablet (200 mg total) by mouth at bedtime. [name], DO Taking Active Self, Pharmacy Records  VRAYLAR 3 MG capsule [id number] No Take 3 mg by mouth daily. 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   Abdominal pannus 08/21/2021   Hardening of the aorta (main artery of the heart) (HCC) 08/21/2021   At high risk for falls 04/20/2020   Hyperlipidemia 02/16/2020   Adrenal mass, right (HCC) 04/29/2019   Chronic cough 03/12/2018   Left shoulder pain 10/14/2016   Primary hypertension 10/14/2016   S/P thyroidectomy 06/27/2016   Chronic bilateral low back pain without sciatica 04/22/2016   Liver lesion 12/24/2015   Malignant tumor of thyroid gland (HCC) 11/16/2015   Controlled type 2 diabetes mellitus with complication, with long-term current use of insulin (HCC) 10/10/2015   Vision loss 08/25/2015   Decreased vision in both eyes 08/25/2015   Prolonged Q-T interval on ECG 08/19/2015   History of pulmonary embolism 08/10/2015   Mild persistent asthma 04/13/2015   Nonruptured cerebral aneurysm 04/13/2015    Fibroid, uterine    Morbid obesity (HCC) 11/11/2014   Atypical chest pain 11/08/2014   Sinus tachycardia 11/08/2014   Migraine 10/14/2014   Menorrhagia 09/08/2014   Conditions to be addressed/monitored per PCP order:  Chronic healthcare management needs, DM, HTN, chronic pain, migraines, anxiety/depression/PTSD, h/o PE, COPD/asthma, SS, adrenal myolipoma, dermatitis  Care Plan : General Plan of Care (Adult)  Updates made by [name], RN since 05/16/2023 12:00 AM     Problem: Health Promotion or Disease Self-Management (General Plan of Care)   Priority: Medium  Onset Date: 10/20/2020     Long-Range Goal: Chronic Disease Management   Start Date: 07/14/2020  Expected End Date: 07/16/2023  Recent Progress: On track  Priority: Medium  Note:    Current Barriers:  Chronic Disease Management support and education needs related to DM, HTN, chronic pain, migraines, asthma, HLD, anxiety, depression Patient with anxiety and depression-has appointment with Psychiatrist  scheduled every 3 months, therapist every 2 weeks 05/16/23:  Not checking BP or BG right now.  New complaint of dizziness-will f/u with provider.  No other complaints today.  Has scheduled MMG in Dec.  ENDO in November.   Nurse Case Manager Clinical Goal(s):  Over the next 90 days, patient will attend all scheduled medical appointments:  Over the next 30 days, patient will work with CM team pharmacist to review medications  Interventions:  Inter-disciplinary care team collaboration (see longitudinal plan of care) Evaluation of current treatment plan  and patient's adherence to plan as established by provider. Reviewed medications with patient. Collaborated with pharmacy regarding medications. Discussed plans with patient for ongoing care management follow up and provided patient with direct contact information for care management team Reviewed scheduled/upcoming provider appointments. Collaborated with SW for  psychotherapy referral. SW referral for psychotherapy provider-completed Pharmacy referral for medication review-completed Collaborated with PCP for new BP cuff-completed  Asthma: (Status:New goal.) Long Term Goal Advised patient to track and manage Asthma triggers Provided education about and advised patient to utilize infection prevention strategies to reduce risk of respiratory infection Discussed the importance of adequate rest and management of fatigue with Asthma Screening for signs and symptoms of depression related to chronic disease state  Assessed social determinant of health barriers   Diabetes Interventions:  (Status:  New goal.) Long Term Goal Assessed patient's understanding of A1c goal: <7% Reviewed medications with patient and discussed importance of medication adherence Discussed plans with patient for ongoing care management follow up and provided patient with direct contact information for care management team Reviewed scheduled/upcoming provider appointments  Advised patient, providing education and rationale, to check cbg as directed  and record, calling provider  for findings outside established parameters Review of patient status, including review of consultants reports, relevant laboratory and other test results, and medications completed Screening for signs and symptoms of depression related to chronic disease state  Assessed social determinant of health barriers Lab Results  Component Value Date         HGBA1C                                                    6.7 02/27/23       HGBA1C                                                      6.2                                    09/26/22  Hyperlipidemia Interventions:  (Status:  New goal.) Long Term Goal Medication review performed; medication list updated in electronic medical record.  Provider established cholesterol goals reviewed Counseled on importance of regular laboratory monitoring as prescribed Reviewed  importance of limiting foods high in cholesterol Screening for signs and symptoms of depression related to chronic disease state Assessed social determinant of health barriers   Hypertension Interventions:  (Status:  New goal.) Long Term Goal Last practice recorded BP readings:  BP Readings from Last 3 Encounters:     04/15/23         130/80 11/08/22:        140/68 01/06/23         100/64  Most recent eGFR/CrCl:  Lab Results  Component Value Date   EGFR  68 12/19/2020    No components found for: "CRCL"  Evaluation of current treatment plan related to hypertension self management and patient's adherence to plan as established by provider Reviewed medications with patient and discussed importance of compliance Discussed plans with patient for ongoing care management follow up and provided patient with direct contact information for care management team Advised patient, providing education and rationale, to monitor blood pressure daily and record, calling PCP for findings outside established parameters Reviewed scheduled/upcoming provider appointments including:  Screening for signs and symptoms of depression related to chronic disease state  Assessed social determinant of health barriers  Pain Interventions:  (Status:  New goal.) Long Term Goal Pain assessment performed Medications reviewed Reviewed provider established plan for pain management Discussed importance of adherence to all scheduled medical appointments Counseled on the importance of reporting any/all new or changed pain symptoms or management strategies to pain management provider Advised patient to report to care team affect of pain on daily activities Reviewed with patient prescribed pharmacological and nonpharmacological pain relief strategies Screening for signs and symptoms of depression related to chronic disease state  Assessed social determinant of health barriers   Self Care Activities: Over the next 90 days,  patient will:  -Self administers medications as prescribed Attends all scheduled provider appointments Calls pharmacy for medication refills Calls provider office for new concerns or questions Patient will locate meter to check blood sugars-completed Schedule eye provider appt.-completed Follow up with provider regarding blood sugars-completed 05/16/23:  patient to contact provider regarding dizziness.  Follow Up Plan: The Managed Medicaid care management team will reach out to the patient again over the next 30  business days.  The patient has been provided with contact information for the Managed Medicaid care management team and has been advised to call with any health related questions or concerns.    Follow Up:  Patient agrees to Care Plan and Follow-up.  Plan: The Managed Medicaid care management team will reach out to the patient again over the next 30 business  days. and The  Patient has been provided with contact information for the Managed Medicaid care management team and has been advised to call with any health related questions or concerns.  Date/time of next scheduled RN care management/care coordination outreach: 06/16/23 at 1230

## 2023-05-16 NOTE — Patient Instructions (Signed)
Hi Melinda [name], thank you for the updates, please follow up with your provider if the dizziness continues.  Melinda [name] was given information about Medicaid Managed Care team care coordination services as a part of their [facility] Medicaid benefit. Melinda [name] verbally consented to engagement with the [facility] Managed Care team.   If you are experiencing a medical emergency, please call 911 or report to your local emergency department or urgent care.   If you have a non-emergency medical problem during routine business hours, please contact your provider's office and ask to speak with a nurse.   For questions related to your [facility], please call: [phone] or visit the homepage here: [email or web address]  If you would like to schedule transportation through your [facility], please call the following number at least 2 days in advance of your appointment: [phone]   Rides for urgent appointments can also be made after hours by calling Member Services.  Call the Behavioral Health Crisis Line at [phone], at any time, 24 hours a day, 7 days a week. If you are in danger or need immediate medical attention call 911.  If you would like help to quit smoking, call 1-800-QUIT-NOW ((314) 345-7070) OR Espaol: 1-855-Djelo-Ya (7-564-332-9518) o para ms informacin haga clic aqu or Text READY to 841-660 to register via text  Melinda [name] - following are the goals we discussed in your visit today:   Goals Addressed             This Visit's Progress    Protect My Health         Timeframe:  Long-Range Goal Priority:  Medium Start Date:    07/14/20                         Expected End Date:  ongoing          Follow Up Date: 06/16/23  - schedule appointment for vaccines needed due to my age or health - schedule recommended health tests (blood work, mammogram,  colonoscopy, pap test) - schedule and keep appointment for annual check-up  05/16/23:  ENDO this month, MMG in Dec   Patient verbalizes understanding of instructions and care plan provided today and agrees to view in MyChart. Active MyChart status and patient understanding of how to access instructions and care plan via MyChart confirmed with patient.     The Managed Medicaid care management team will reach out to the patient again over the next 30 business  days.  The  Patient  has been provided with contact information for the Managed Medicaid care management team and has been advised to call with any health related questions or concerns.   Melinda Der RN, BSN [place]  [facility] Care Management Coordinator - Managed Medicaid High Risk [phone]   Following is a copy of your plan of care:  Care Plan : General Plan of Care (Adult)  Updates made by [name], RN since 05/16/2023 12:00 AM     Problem: Health Promotion or Disease Self-Management (General Plan of Care)   Priority: Medium  Onset Date: 10/20/2020     Long-Range Goal: Chronic Disease Management   Start Date: 07/14/2020  Expected End Date: 07/16/2023  Recent Progress: On track  Priority: Medium  Note:    Current Barriers:  Chronic Disease Management support and education needs related to DM, HTN, chronic pain, migraines, asthma, HLD, anxiety, depression Patient  with anxiety and depression-has appointment with Psychiatrist  scheduled every 3 months, therapist every 2 weeks 05/16/23:  Not checking BP or BG right now.  New complaint of dizziness-will f/u with provider.  No other complaints today.  Has scheduled MMG in Dec.  ENDO in November.   Nurse Case Manager Clinical Goal(s):  Over the next 90 days, patient will attend all scheduled medical appointments:  Over the next 30 days, patient will work with CM team pharmacist to review medications  Interventions:  Inter-disciplinary care team  collaboration (see longitudinal plan of care) Evaluation of current treatment plan  and patient's adherence to plan as established by provider. Reviewed medications with patient. Collaborated with pharmacy regarding medications. Discussed plans with patient for ongoing care management follow up and provided patient with direct contact information for care management team Reviewed scheduled/upcoming provider appointments. Collaborated with SW for psychotherapy referral. SW referral for psychotherapy provider-completed Pharmacy referral for medication review-completed Collaborated with PCP for new BP cuff-completed  Asthma: (Status:New goal.) Long Term Goal Advised patient to track and manage Asthma triggers Provided education about and advised patient to utilize infection prevention strategies to reduce risk of respiratory infection Discussed the importance of adequate rest and management of fatigue with Asthma Screening for signs and symptoms of depression related to chronic disease state  Assessed social determinant of health barriers   Diabetes Interventions:  (Status:  New goal.) Long Term Goal Assessed patient's understanding of A1c goal: <7% Reviewed medications with patient and discussed importance of medication adherence Discussed plans with patient for ongoing care management follow up and provided patient with direct contact information for care management team Reviewed scheduled/upcoming provider appointments  Advised patient, providing education and rationale, to check cbg as directed  and record, calling provider  for findings outside established parameters Review of patient status, including review of consultants reports, relevant laboratory and other test results, and medications completed Screening for signs and symptoms of depression related to chronic disease state  Assessed social determinant of health barriers Lab Results  Component Value Date         HGBA1C                                                     6.7 02/27/23       HGBA1C                                                      6.2                                    09/26/22  Hyperlipidemia Interventions:  (Status:  New goal.) Long Term Goal Medication review performed; medication list updated in electronic medical record.  Provider established cholesterol goals reviewed Counseled on importance of regular laboratory monitoring as prescribed Reviewed importance of limiting foods high in cholesterol Screening for signs and symptoms of depression related to chronic disease state Assessed social determinant of health barriers   Hypertension Interventions:  (Status:  New goal.) Long Term Goal Last practice recorded BP readings:  BP Readings from Last 3 Encounters:  04/15/23         130/80 11/08/22:        140/68 01/06/23         100/64  Most recent eGFR/CrCl:  Lab Results  Component Value Date   EGFR 68 12/19/2020    No components found for: "CRCL"  Evaluation of current treatment plan related to hypertension self management and patient's adherence to plan as established by provider Reviewed medications with patient and discussed importance of compliance Discussed plans with patient for ongoing care management follow up and provided patient with direct contact information for care management team Advised patient, providing education and rationale, to monitor blood pressure daily and record, calling PCP for findings outside established parameters Reviewed scheduled/upcoming provider appointments including:  Screening for signs and symptoms of depression related to chronic disease state  Assessed social determinant of health barriers  Pain Interventions:  (Status:  New goal.) Long Term Goal Pain assessment performed Medications reviewed Reviewed provider established plan for pain management Discussed importance of adherence to all scheduled medical appointments Counseled on the importance of  reporting any/all new or changed pain symptoms or management strategies to pain management provider Advised patient to report to care team affect of pain on daily activities Reviewed with patient prescribed pharmacological and nonpharmacological pain relief strategies Screening for signs and symptoms of depression related to chronic disease state  Assessed social determinant of health barriers   Self Care Activities: Over the next 90 days, patient will:  -Self administers medications as prescribed Attends all scheduled provider appointments Calls pharmacy for medication refills Calls provider office for new concerns or questions Patient will locate meter to check blood sugars-completed Schedule eye provider appt.-completed Follow up with provider regarding blood sugars-completed 05/16/23:  patient to contact provider regarding dizziness.  Follow Up Plan: The Managed Medicaid care management team will reach out to the patient again over the next 30  business days.  The patient has been provided with contact information for the Managed Medicaid care management team and has been advised to call with any health related questions or concerns.

## 2023-05-19 DIAGNOSIS — R269 Unspecified abnormalities of gait and mobility: Secondary | ICD-10-CM | POA: Diagnosis not present

## 2023-05-20 DIAGNOSIS — R269 Unspecified abnormalities of gait and mobility: Secondary | ICD-10-CM | POA: Diagnosis not present

## 2023-05-21 DIAGNOSIS — R269 Unspecified abnormalities of gait and mobility: Secondary | ICD-10-CM | POA: Diagnosis not present

## 2023-05-22 DIAGNOSIS — R269 Unspecified abnormalities of gait and mobility: Secondary | ICD-10-CM | POA: Diagnosis not present

## 2023-05-23 DIAGNOSIS — R269 Unspecified abnormalities of gait and mobility: Secondary | ICD-10-CM | POA: Diagnosis not present

## 2023-05-26 DIAGNOSIS — R269 Unspecified abnormalities of gait and mobility: Secondary | ICD-10-CM | POA: Diagnosis not present

## 2023-05-28 DIAGNOSIS — E890 Postprocedural hypothyroidism: Secondary | ICD-10-CM | POA: Diagnosis not present

## 2023-05-28 DIAGNOSIS — E270 Other adrenocortical overactivity: Secondary | ICD-10-CM | POA: Diagnosis not present

## 2023-05-28 DIAGNOSIS — Z8639 Personal history of other endocrine, nutritional and metabolic disease: Secondary | ICD-10-CM | POA: Diagnosis not present

## 2023-05-28 DIAGNOSIS — Z85850 Personal history of malignant neoplasm of thyroid: Secondary | ICD-10-CM | POA: Diagnosis not present

## 2023-05-28 DIAGNOSIS — E119 Type 2 diabetes mellitus without complications: Secondary | ICD-10-CM | POA: Diagnosis not present

## 2023-05-28 DIAGNOSIS — D3501 Benign neoplasm of right adrenal gland: Secondary | ICD-10-CM | POA: Diagnosis not present

## 2023-05-28 DIAGNOSIS — R748 Abnormal levels of other serum enzymes: Secondary | ICD-10-CM | POA: Diagnosis not present

## 2023-06-02 DIAGNOSIS — R269 Unspecified abnormalities of gait and mobility: Secondary | ICD-10-CM | POA: Diagnosis not present

## 2023-06-03 ENCOUNTER — Ambulatory Visit
Admission: RE | Admit: 2023-06-03 | Discharge: 2023-06-03 | Disposition: A | Discharge status: Home / Self Care | Payer: Medicaid Other | Source: Ambulatory Visit | Attending: Internal Medicine | Admitting: Internal Medicine

## 2023-06-03 DIAGNOSIS — Z1231 Encounter for screening mammogram for malignant neoplasm of breast: Secondary | ICD-10-CM

## 2023-06-03 DIAGNOSIS — R269 Unspecified abnormalities of gait and mobility: Secondary | ICD-10-CM | POA: Diagnosis not present

## 2023-06-04 DIAGNOSIS — R269 Unspecified abnormalities of gait and mobility: Secondary | ICD-10-CM | POA: Diagnosis not present

## 2023-06-05 DIAGNOSIS — R269 Unspecified abnormalities of gait and mobility: Secondary | ICD-10-CM | POA: Diagnosis not present

## 2023-06-06 DIAGNOSIS — R269 Unspecified abnormalities of gait and mobility: Secondary | ICD-10-CM | POA: Diagnosis not present

## 2023-06-10 DIAGNOSIS — R269 Unspecified abnormalities of gait and mobility: Secondary | ICD-10-CM | POA: Diagnosis not present

## 2023-06-11 DIAGNOSIS — R269 Unspecified abnormalities of gait and mobility: Secondary | ICD-10-CM | POA: Diagnosis not present

## 2023-06-12 DIAGNOSIS — R269 Unspecified abnormalities of gait and mobility: Secondary | ICD-10-CM | POA: Diagnosis not present

## 2023-06-12 DIAGNOSIS — G4733 Obstructive sleep apnea (adult) (pediatric): Secondary | ICD-10-CM | POA: Diagnosis not present

## 2023-06-13 DIAGNOSIS — R269 Unspecified abnormalities of gait and mobility: Secondary | ICD-10-CM | POA: Diagnosis not present

## 2023-06-16 ENCOUNTER — Other Ambulatory Visit: Payer: Self-pay | Admitting: Obstetrics and Gynecology

## 2023-06-16 DIAGNOSIS — R269 Unspecified abnormalities of gait and mobility: Secondary | ICD-10-CM | POA: Diagnosis not present

## 2023-06-16 NOTE — Patient Outreach (Signed)
Medicaid Managed Care   Nurse Care Manager Note  06/16/2023 Name:  Melinda [name] MRN:  [id number] DOB:  1965-12-28  Melinda [name] is an 57 y.o. year old female who is a primary patient of [place], [place], [place].  The [facility] Managed Care Coordination team was consulted for assistance with:    Chronic healthcare management needs, DM, HTN, chronic pain, migraines, anxiety/depression/PTSD, h/o PE, COPD, asthma, SS, dermatitis, adrenal myolipoma  [name] was given information about Medicaid Managed Care Coordination team services today. Melinda [name] Patient agreed to services and verbal consent obtained.  Engaged with patient by telephone for follow up visit in response to provider referral for case management and/or care coordination services.   Patient is participating in a Managed Medicaid Plan:  Yes  Assessments/Interventions:  Review of past medical history, allergies, medications, health status, including review of consultants reports, laboratory and other test data, was performed as part of comprehensive evaluation and provision of chronic care management services.  SDOH (Social Drivers of Health) assessments and interventions performed: SDOH Interventions    Flowsheet Row Patient Outreach Telephone from 06/16/2023 in [place] POPULATION HEALTH DEPARTMENT Patient Outreach Telephone from 05/16/2023 in [place] POPULATION HEALTH DEPARTMENT Patient Outreach Telephone from 04/15/2023 in [place] POPULATION HEALTH DEPARTMENT Patient Outreach Telephone from 04/09/2023 in [place] POPULATION HEALTH DEPARTMENT Patient Outreach Telephone from 03/18/2023 in [place] POPULATION HEALTH DEPARTMENT Patient Outreach Telephone from 02/18/2023 in [place] POPULATION HEALTH DEPARTMENT  SDOH Interventions        Housing Interventions -- -- Intervention Not Indicated -- Intervention Not Indicated --  Transportation Interventions -- Intervention Not Indicated -- -- -- --  Utilities  Interventions -- -- Intervention Not Indicated -- -- --  Alcohol Usage Interventions -- -- -- -- -- Intervention Not Indicated (Score <7)  Financial Strain Interventions -- Intervention Not Indicated -- -- -- --  Physical Activity Interventions -- -- -- -- -- Intervention Not Indicated  [not able to perform this type of exercise]  Stress Interventions -- -- -- [employer], Provide Counseling Offered [employer], Provide Counseling --  Social Connections Interventions Intervention Not Indicated -- -- -- -- --     Care Plan Allergies  Allergen Reactions   Ceftriaxone Anaphylaxis and Other (See Comments)    ROCEPHIN   Metformin Anaphylaxis    ALL   Penicillins Shortness Of Breath    Has patient had a PCN reaction causing immediate rash, facial/tongue/throat swelling, SOB or lightheadedness with hypotension: Yes Has patient had a PCN reaction causing severe rash involving mucus membranes or skin necrosis: No Has patient had a PCN reaction that required hospitalization No Has patient had a PCN reaction occurring within the last 10 years: No If all of the above answers are "NO", then may proceed with Cephalosporin use.    Shellfish Allergy Anaphylaxis   Shellfish-Derived Products Anaphylaxis   Citrus Rash   Flonase [Fluticasone Propionate]     Makes migraines worse   Gold-Containing Drug Products     HANDS ITCH   Metformin Hcl Other (See Comments)   Nickel     HANDS SWELL   Penicillin G Other (See Comments)   Medications Reviewed Today     Reviewed by [name], RN (Registered Nurse) on 06/16/23 at 1237  Med List Status: <None>   Medication Order Taking? Sig Documenting Provider Last Dose Status Informant  albuterol (PROVENTIL) (2.5 MG/3ML) 0.083% nebulizer solution [id number] No Take 3 mLs (2.5 mg  total) by nebulization every 6 (six) hours as needed for wheezing or shortness of breath. [name], MD Taking Active Self, Pharmacy  Records  albuterol (VENTOLIN HFA) [phone] Base) MCG/ACT inhaler [id number] No Inhale 2 puffs into the lungs every 6 (six) hours as needed for wheezing or shortness of breath. [name], MD Taking Active Self, Pharmacy Records  apixaban (ELIQUIS) 5 MG TABS tablet [id number] No TAKE ONE TABLET BY MOUTH TWICE DAILY MORNING AND [name], MD Taking Active Self, Pharmacy Records  atorvastatin (LIPITOR) 20 MG tablet [id number] No TAKE ONE TABLET BY MOUTH ONCE DAILY AT BEDTIME [name], MD Taking Active Self, Pharmacy Records  budesonide-formoterol [facility]) 160-4.5 MCG/ACT inhaler [id number] No Inhale 2 puffs into the lungs 2 (two) times daily. [name], MD Taking Active Self, Pharmacy Records  buPROPion (WELLBUTRIN XL) 300 MG 24 hr tablet [id number] No Take 300 mg by mouth daily.  [provider] Taking Active Self, Pharmacy Records  busPIRone (BUSPAR) 10 MG tablet [id number] No Take 10 mg by mouth 2 (two) times daily. [provider] Taking Active Self, Pharmacy Records           Med Note [name], [facility] I   Wed Feb 18, 2018 10:03 PM)    Cholecalciferol (VITAMIN D3) 10 MCG (400 UNIT) CAPS [id number] No TAKE ONE CAPSULE BY MOUTH ONCE DAILY (AM) [name], MD Taking Active Self, Pharmacy Records  cyclobenzaprine (FLEXERIL) 10 MG tablet [id number] No TAKE 1 TABLET (10 MG TOTAL) BY MOUTH 3 (THREE) TIMES DAILY AS NEEDED FOR MUSCLE SPASMS. [name], MD Taking Active Self, Pharmacy Records  doxepin [facility]) 50 MG capsule [id number] No Take 150 mg by mouth at bedtime. [provider] Taking Active Self, Pharmacy Records  Erenumab-aooe (AIMOVIG) 140 MG/ML SOAJ [id number] No Inject 140 mg into the skin every 28 (twenty-eight) days. [name], DO Taking Active   gabapentin (NEURONTIN) 300 MG capsule [id number] No TAKE ONE CAPSULE BY MOUTH THREE TIMES DAILY (AM+NOON+BEDTIME) [name], MD Taking Active Self, Pharmacy Records   ibuprofen (ADVIL) 600 MG tablet [id number] No Take 1 tablet (600 mg total) by mouth every 6 (six) hours as needed. [name], [place] Taking Active   ketoconazole (NIZORAL) 2 % cream [id number]  Apply 1 Application topically daily. Apply to affected areas on face twice a day for 3 weeks after using nystatin-triamcinolone for 1 week. Chelsa Stout Piedra, DO  Active   levothyroxine (SYNTHROID) 137 MCG tablet [id number] No Take 274 mcg by mouth daily before breakfast. [provider] Taking Active Self, Pharmacy Records  loratadine (CLARITIN) 10 MG tablet [id number] No TAKE 1 TABLET (10 MG TOTAL) BY MOUTH DAILY (AM) [name], MD Taking Active Self, Pharmacy Records  metoprolol tartrate (LOPRESSOR) 50 MG tablet [id number] No TAKE ONE TABLET BY MOUTH TWICE DAILY (AM+BEDTIME)  Patient taking differently: Take 75 mg by mouth 2 (two) times daily. TAKE ONE TABLET BY MOUTH TWICE DAILY (AM+BEDTIME)   [name], MD Taking Active Self, Pharmacy Records  montelukast (SINGULAIR) 10 MG tablet [id number] No TAKE 1 TABLET BY MOUTH EVERY EVENING AT BEDTIME [name], MD Taking Active Self, Pharmacy Records  nitrofurantoin, macrocrystal-monohydrate, (MACROBID) 100 MG capsule [id number] No Take 1 capsule (100 mg total) by mouth 2 (two) times daily. [name], [place] Taking Active   nystatin-triamcinolone ointment [facility]) [id number] No Apply 1 Application topically 2 (two) times daily. Apply topically two times daily for five days Austine Kelsay Piedra, DO Taking Active  Self, Pharmacy Records  ondansetron (ZOFRAN-ODT) 4 MG disintegrating tablet [id number] No Take 1 tablet (4 mg total) by mouth every 8 (eight) hours as needed for nausea or vomiting. [name], PA Taking Active   oxybutynin (DITROPAN-XL) 5 MG 24 hr tablet [id number]  Take 5 mg by mouth at bedtime. [provider]  Active   prazosin (MINIPRESS) 2 MG capsule [id number] No Take 6 mg by mouth at bedtime. [provider]  Taking Active Self, Pharmacy Records  Semaglutide, 1 MG/DOSE, 2 MG/1.5ML SOPN [id number] No Inject 2 mg into the skin once a week. [provider] Taking Active Self, Pharmacy Records  topiramate (TOPAMAX) 200 MG tablet [id number] No Take 1 tablet (200 mg total) by mouth at bedtime. [name], DO Taking Active Self, Pharmacy Records  VRAYLAR 3 MG capsule [id number] No Take 3 mg by mouth daily. 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   Abdominal pannus 08/21/2021   Hardening of the aorta (main artery of the heart) (HCC) 08/21/2021   At high risk for falls 04/20/2020   Hyperlipidemia 02/16/2020   Adrenal mass, right (HCC) 04/29/2019   Chronic cough 03/12/2018   Left shoulder pain 10/14/2016   Primary hypertension 10/14/2016   S/P thyroidectomy 06/27/2016   Chronic bilateral low back pain without sciatica 04/22/2016   Liver lesion 12/24/2015   Malignant tumor of thyroid gland (HCC) 11/16/2015   Controlled type 2 diabetes mellitus with complication, with long-term current use of insulin (HCC) 10/10/2015   Vision loss 08/25/2015   Decreased vision in both eyes 08/25/2015   Prolonged Q-T interval on ECG 08/19/2015   History of pulmonary embolism 08/10/2015   Mild persistent asthma 04/13/2015   Nonruptured cerebral aneurysm 04/13/2015   Fibroid, uterine    Morbid obesity (HCC) 11/11/2014   Atypical chest pain 11/08/2014   Sinus tachycardia  11/08/2014   Migraine 10/14/2014   Menorrhagia 09/08/2014   Conditions to be addressed/monitored per PCP order:  Chronic healthcare management needs, DM, HTN, chronic pain, migraines, anxiety/depression/PTSD, h/o PE, COPD, asthma, SS, dermatitis, adrenal myolipoma  Care Plan : General Plan of Care (Adult)  Updates made by [name], RN since 06/16/2023 12:00 AM     Problem: Health Promotion or Disease Self-Management (General Plan of Care)   Priority: Medium  Onset Date: 10/20/2020     Long-Range Goal: Chronic Disease Management   Start Date: 07/14/2020  Expected End Date: 09/14/2023  Recent Progress: On track  Priority: Medium  Note:    Current Barriers:  Chronic Disease Management support and education needs related to DM, HTN, chronic pain, migraines, asthma, HLD, anxiety, depression Patient with anxiety and depression-has appointment with Psychiatrist  scheduled every 3 months, therapist every 2 weeks 06/16/23:  Dizziness improved.  Awaiting new cargiver to be assigned d/t previous caregiver moving to [place] last week.  ENDO and MMG appt completed.  A1C between 5 and 6 per patient.  Not checking BP, BG right now.  Nurse Case Manager Clinical Goal(s):  Over the next 90 days, patient will attend all scheduled medical appointments:  Over the next 30 days, patient will work with CM team pharmacist to review medications  Interventions:  Inter-disciplinary care team collaboration (see longitudinal plan of care) Evaluation of current treatment plan  and patient's adherence to plan as established by provider. Reviewed medications with patient. Collaborated with pharmacy regarding medications. Discussed plans with patient for ongoing care management follow up and provided patient with direct contact information for care management team Reviewed scheduled/upcoming provider appointments. Collaborated with SW for psychotherapy referral. SW referral for psychotherapy  provider-completed Pharmacy referral for medication review-completed Collaborated with PCP for new BP cuff-completed  Asthma: (Status:New goal.) Long Term Goal Advised patient to track and manage Asthma triggers Provided education about and advised patient to utilize infection prevention strategies to reduce risk of respiratory infection Discussed the importance of adequate rest and management of fatigue with Asthma Screening for signs and symptoms of depression related to chronic disease state  Assessed social determinant of health barriers   Diabetes Interventions:  (Status:  New goal.) Long Term Goal Assessed patient's understanding of A1c goal: <7% Reviewed medications with patient and discussed importance of medication adherence Discussed plans with patient for ongoing care management follow up and provided patient with direct contact information for care management team Reviewed scheduled/upcoming provider appointments  Advised patient, providing education and rationale, to check cbg as directed  and record, calling provider  for findings outside established parameters Review of patient status, including review of consultants reports, relevant laboratory and other test results, and medications completed Screening for signs and symptoms of depression related to chronic disease state  Assessed social determinant of health barriers Lab Results  Component Value Date         HGBA1C                                                    6.7 02/27/23       HGBA1C                                                      6.2                                    09/26/22 06/16/23:  between 5 and 6 per patient on visit to ENDO 11/27.  Hyperlipidemia Interventions:  (Status:  New goal.) Long Term Goal Medication review performed; medication list updated in electronic medical record.  Provider established cholesterol goals reviewed Counseled on importance of regular laboratory monitoring as  prescribed Reviewed importance of limiting foods high in cholesterol Screening for signs and symptoms of depression related to chronic disease state Assessed social determinant of health barriers   Hypertension Interventions:  (Status:  New goal.) Long Term Goal Last practice recorded BP readings:  BP Readings from Last 3 Encounters:     04/15/23         130/80 11/08/22:        140/68 01/06/23         100/64  Most recent eGFR/CrCl:  Lab  Results  Component Value Date   EGFR 68 12/19/2020    No components found for: "CRCL"  Evaluation of current treatment plan related to hypertension self management and patient's adherence to plan as established by provider Reviewed medications with patient and discussed importance of compliance Discussed plans with patient for ongoing care management follow up and provided patient with direct contact information for care management team Advised patient, providing education and rationale, to monitor blood pressure daily and record, calling PCP for findings outside established parameters Reviewed scheduled/upcoming provider appointments including:  Screening for signs and symptoms of depression related to chronic disease state  Assessed social determinant of health barriers  Pain Interventions:  (Status:  New goal.) Long Term Goal Pain assessment performed Medications reviewed Reviewed provider established plan for pain management Discussed importance of adherence to all scheduled medical appointments Counseled on the importance of reporting any/all new or changed pain symptoms or management strategies to pain management provider Advised patient to report to care team affect of pain on daily activities Reviewed with patient prescribed pharmacological and nonpharmacological pain relief strategies Screening for signs and symptoms of depression related to chronic disease state  Assessed social determinant of health barriers   Self Care  Activities: Over the next 90 days, patient will:  -Self administers medications as prescribed Attends all scheduled provider appointments Calls pharmacy for medication refills Calls provider office for new concerns or questions Patient will locate meter to check blood sugars-completed Schedule eye provider appt.-completed Follow up with provider regarding blood sugars-completed 05/16/23:  patient to contact provider regarding dizziness.  Follow Up Plan: The Managed Medicaid care management team will reach out to the patient again over the next 45  business days.  The patient has been provided with contact information for the Managed Medicaid care management team and has been advised to call with any health related questions or concerns.    Follow Up:  Patient agrees to Care Plan and Follow-up.  Plan: The Managed Medicaid care management team will reach out to the patient again over the next 45 business  days. and The  Patient has been provided with contact information for the Managed Medicaid care management team and has been advised to call with any health related questions or concerns.  Date/time of next scheduled RN care management/care coordination outreach: 07/31/23 at 1230.

## 2023-06-16 NOTE — Patient Instructions (Signed)
Hi Ms. Okabe, I am glad the dizziness is improving-have a Happy Birthday!  Ms. Ranshaw was given information about Medicaid Managed Care team care coordination services as a part of their [facility] Community Plan Medicaid benefit. [name] verbally consented to engagement with the [facility] Managed Care team.   If you are experiencing a medical emergency, please call 911 or report to your local emergency department or urgent care.   If you have a non-emergency medical problem during routine business hours, please contact your provider's office and ask to speak with a nurse.   For questions related to your [facility], please call: [phone] or visit the homepage here: [email or web address]  If you would like to schedule transportation through your [facility], please call the following number at least 2 days in advance of your appointment: [phone]   Rides for urgent appointments can also be made after hours by calling Member Services.  Call the Behavioral Health Crisis Line at [phone], at any time, 24 hours a day, 7 days a week. If you are in danger or need immediate medical attention call 911.  If you would like help to quit smoking, call 1-800-QUIT-NOW ((234) 143-8006) OR Espaol: 1-855-Djelo-Ya (1-324-401-0272) o para ms informacin haga clic aqu or Text READY to 536-644 to register via text  Ms. Fossett - following are the goals we discussed in your visit today:   Goals Addressed             This Visit's Progress    Protect My Health         Timeframe:  Long-Range Goal Priority:  Medium Start Date:    07/14/20                         Expected End Date:  ongoing          Follow Up Date: 07/31/23  - schedule appointment for vaccines needed due to my age or health - schedule recommended health tests (blood work, mammogram, colonoscopy, pap test) - schedule  and keep appointment for annual check-up  06/16/23:  Completed ENDO appt and MMG.  Has Podiatry and DERM appt in January   Patient verbalizes understanding of instructions and care plan provided today and agrees to view in MyChart. Active MyChart status and patient understanding of how to access instructions and care plan via MyChart confirmed with patient.     The Managed Medicaid care management team will reach out to the patient again over the next 45 business  days.  The  Patient  has been provided with contact information for the Managed Medicaid care management team and has been advised to call with any health related questions or concerns.   [name], BSN [place]  [facility] Care Management Coordinator - Managed Medicaid High Risk [phone]   Following is a copy of your plan of care:  Care Plan : General Plan of Care (Adult)  Updates made by [name], RN since 06/16/2023 12:00 AM     Problem: Health Promotion or Disease Self-Management (General Plan of Care)   Priority: Medium  Onset Date: 10/20/2020     Long-Range Goal: Chronic Disease Management   Start Date: 07/14/2020  Expected End Date: 09/14/2023  Recent Progress: On track  Priority: Medium  Note:    Current Barriers:  Chronic Disease Management support and education needs related to DM, HTN, chronic pain, migraines, asthma, HLD, anxiety,  depression Patient with anxiety and depression-has appointment with Psychiatrist  scheduled every 3 months, therapist every 2 weeks 06/16/23:  Dizziness improved.  Awaiting new cargiver to be assigned d/t previous caregiver moving to [place] last week.  ENDO and MMG appt completed.  A1C between 5 and 6 per patient.  Not checking BP, BG right now.     Nurse Case Manager Clinical Goal(s):  Over the next 90 days, patient will attend all scheduled medical appointments:  Over the next 30 days, patient will work with CM team pharmacist to review  medications  Interventions:  Inter-disciplinary care team collaboration (see longitudinal plan of care) Evaluation of current treatment plan  and patient's adherence to plan as established by provider. Reviewed medications with patient. Collaborated with pharmacy regarding medications. Discussed plans with patient for ongoing care management follow up and provided patient with direct contact information for care management team Reviewed scheduled/upcoming provider appointments. Collaborated with SW for psychotherapy referral. SW referral for psychotherapy provider-completed Pharmacy referral for medication review-completed Collaborated with PCP for new BP cuff-completed  Asthma: (Status:New goal.) Long Term Goal Advised patient to track and manage Asthma triggers Provided education about and advised patient to utilize infection prevention strategies to reduce risk of respiratory infection Discussed the importance of adequate rest and management of fatigue with Asthma Screening for signs and symptoms of depression related to chronic disease state  Assessed social determinant of health barriers   Diabetes Interventions:  (Status:  New goal.) Long Term Goal Assessed patient's understanding of A1c goal: <7% Reviewed medications with patient and discussed importance of medication adherence Discussed plans with patient for ongoing care management follow up and provided patient with direct contact information for care management team Reviewed scheduled/upcoming provider appointments  Advised patient, providing education and rationale, to check cbg as directed  and record, calling provider  for findings outside established parameters Review of patient status, including review of consultants reports, relevant laboratory and other test results, and medications completed Screening for signs and symptoms of depression related to chronic disease state  Assessed social determinant of health  barriers Lab Results  Component Value Date         HGBA1C                                                    6.7 02/27/23       HGBA1C                                                      6.2                                    09/26/22 06/16/23:  between 5 and 6 per patient on visit to ENDO 11/27.  Hyperlipidemia Interventions:  (Status:  New goal.) Long Term Goal Medication review performed; medication list updated in electronic medical record.  Provider established cholesterol goals reviewed Counseled on importance of regular laboratory monitoring as prescribed Reviewed importance of limiting foods high in cholesterol Screening for signs and symptoms of depression related to chronic disease state Assessed social determinant of health barriers  Hypertension Interventions:  (Status:  New goal.) Long Term Goal Last practice recorded BP readings:  BP Readings from Last 3 Encounters:     04/15/23         130/80 11/08/22:        140/68 01/06/23         100/64  Most recent eGFR/CrCl:  Lab Results  Component Value Date   EGFR 68 12/19/2020    No components found for: "CRCL"  Evaluation of current treatment plan related to hypertension self management and patient's adherence to plan as established by provider Reviewed medications with patient and discussed importance of compliance Discussed plans with patient for ongoing care management follow up and provided patient with direct contact information for care management team Advised patient, providing education and rationale, to monitor blood pressure daily and record, calling PCP for findings outside established parameters Reviewed scheduled/upcoming provider appointments including:  Screening for signs and symptoms of depression related to chronic disease state  Assessed social determinant of health barriers  Pain Interventions:  (Status:  New goal.) Long Term Goal Pain assessment performed Medications reviewed Reviewed provider  established plan for pain management Discussed importance of adherence to all scheduled medical appointments Counseled on the importance of reporting any/all new or changed pain symptoms or management strategies to pain management provider Advised patient to report to care team affect of pain on daily activities Reviewed with patient prescribed pharmacological and nonpharmacological pain relief strategies Screening for signs and symptoms of depression related to chronic disease state  Assessed social determinant of health barriers   Self Care Activities: Over the next 90 days, patient will:  -Self administers medications as prescribed Attends all scheduled provider appointments Calls pharmacy for medication refills Calls provider office for new concerns or questions Patient will locate meter to check blood sugars-completed Schedule eye provider appt.-completed Follow up with provider regarding blood sugars-completed 05/16/23:  patient to contact provider regarding dizziness.  Follow Up Plan: The Managed Medicaid care management team will reach out to the patient again over the next 45  business days.  The patient has been provided with contact information for the Managed Medicaid care management team and has been advised to call with any health related questions or concerns.

## 2023-06-17 DIAGNOSIS — R269 Unspecified abnormalities of gait and mobility: Secondary | ICD-10-CM | POA: Diagnosis not present

## 2023-06-18 DIAGNOSIS — R269 Unspecified abnormalities of gait and mobility: Secondary | ICD-10-CM | POA: Diagnosis not present

## 2023-06-19 DIAGNOSIS — R269 Unspecified abnormalities of gait and mobility: Secondary | ICD-10-CM | POA: Diagnosis not present

## 2023-06-20 DIAGNOSIS — R269 Unspecified abnormalities of gait and mobility: Secondary | ICD-10-CM | POA: Diagnosis not present

## 2023-06-20 DIAGNOSIS — H04123 Dry eye syndrome of bilateral lacrimal glands: Secondary | ICD-10-CM | POA: Diagnosis not present

## 2023-06-20 DIAGNOSIS — H16143 Punctate keratitis, bilateral: Secondary | ICD-10-CM | POA: Diagnosis not present

## 2023-06-23 DIAGNOSIS — R269 Unspecified abnormalities of gait and mobility: Secondary | ICD-10-CM | POA: Diagnosis not present

## 2023-06-24 DIAGNOSIS — R269 Unspecified abnormalities of gait and mobility: Secondary | ICD-10-CM | POA: Diagnosis not present

## 2023-06-25 DIAGNOSIS — R269 Unspecified abnormalities of gait and mobility: Secondary | ICD-10-CM | POA: Diagnosis not present

## 2023-06-26 DIAGNOSIS — R269 Unspecified abnormalities of gait and mobility: Secondary | ICD-10-CM | POA: Diagnosis not present

## 2023-06-27 DIAGNOSIS — R269 Unspecified abnormalities of gait and mobility: Secondary | ICD-10-CM | POA: Diagnosis not present

## 2023-06-30 DIAGNOSIS — R269 Unspecified abnormalities of gait and mobility: Secondary | ICD-10-CM | POA: Diagnosis not present

## 2023-07-02 DIAGNOSIS — R269 Unspecified abnormalities of gait and mobility: Secondary | ICD-10-CM | POA: Diagnosis not present

## 2023-07-03 DIAGNOSIS — R269 Unspecified abnormalities of gait and mobility: Secondary | ICD-10-CM | POA: Diagnosis not present

## 2023-07-04 ENCOUNTER — Ambulatory Visit: Payer: Medicaid Other | Admitting: Podiatry

## 2023-07-04 ENCOUNTER — Encounter: Payer: Self-pay | Admitting: Podiatry

## 2023-07-04 VITALS — Ht 70.0 in | Wt 310.0 lb

## 2023-07-04 DIAGNOSIS — E1142 Type 2 diabetes mellitus with diabetic polyneuropathy: Secondary | ICD-10-CM

## 2023-07-04 DIAGNOSIS — L600 Ingrowing nail: Secondary | ICD-10-CM

## 2023-07-04 DIAGNOSIS — M79674 Pain in right toe(s): Secondary | ICD-10-CM

## 2023-07-04 DIAGNOSIS — B351 Tinea unguium: Secondary | ICD-10-CM | POA: Diagnosis not present

## 2023-07-04 DIAGNOSIS — M79675 Pain in left toe(s): Secondary | ICD-10-CM | POA: Diagnosis not present

## 2023-07-04 DIAGNOSIS — R269 Unspecified abnormalities of gait and mobility: Secondary | ICD-10-CM | POA: Diagnosis not present

## 2023-07-04 NOTE — Progress Notes (Signed)
 Subjective:  Patient ID: Melinda [name], female    DOB: November 30, 1965,  MRN: [id number]  Melinda [name] presents to clinic today for: at risk foot care with history of diabetic neuropathy and painful elongated mycotic toenails 1-5 bilaterally which are tender when wearing enclosed shoe gear. Pain is relieved with periodic professional debridement.  Chief Complaint  Patient presents with   Nail Problem    Patient is here for [facility]    PCP is [place], [place]  E, PA.  Allergies  Allergen Reactions   Ceftriaxone Anaphylaxis and Other (See Comments)    ROCEPHIN   Metformin  Anaphylaxis    ALL   Penicillins Shortness Of Breath    Has patient had a PCN reaction causing immediate rash, facial/tongue/throat swelling, SOB or lightheadedness with hypotension: Yes Has patient had a PCN reaction causing severe rash involving mucus membranes or skin necrosis: No Has patient had a PCN reaction that required hospitalization No Has patient had a PCN reaction occurring within the last 10 years: No If all of the above answers are NO, then may proceed with Cephalosporin use.    Shellfish Allergy Anaphylaxis   Shellfish-Derived Products Anaphylaxis   Citrus Rash   Flonase  [Fluticasone  Propionate]     Makes migraines worse   Gold-Containing Drug Products     HANDS ITCH   Metformin  Hcl Other (See Comments)   Nickel     HANDS SWELL   Penicillin G Other (See Comments)    Review of Systems: Negative except as noted in the HPI.  Objective: No changes noted in today's physical examination. There were no vitals filed for this visit.  Melinda [name] is a pleasant 58 y.o. female in NAD. AAO x 3.  Vascular Examination: Capillary refill time <3 seconds b/l LE. Palpable pedal pulses b/l LE. Digital hair present b/l. Skin temperature gradient WNL b/l. No varicosities b/l. Trace edema noted BLE.Melinda [name]  Dermatological Examination: Pedal skin with normal turgor, texture and tone b/l. No open wounds. No interdigital  macerations b/l.   Toenails 2-5 b/l thickened, discolored, dystrophic with subungual debris. There is pain on palpation to dorsal aspect of nailplates. Incurvated nailplate both borders of left hallux and both borders of right hallux.  Nail border hypertrophy absent. There is tenderness to palpation. Sign(s) of infection: no clinical signs of infection noted on examination today...  Neurological Examination: Protective sensation intact with 10 gram monofilament b/l LE. Vibratory sensation intact b/l LE. Pt has subjective symptoms of neuropathy.  Musculoskeletal Examination: Muscle strength 5/5 to all LE muscle groups of right foot. Muscle strength 3/5 to all LE muscle groups of left foot.  Assessment/Plan: 1. Pain due to onychomycosis of toenails of both feet   2. Ingrown toenail without infection   3. Diabetic peripheral neuropathy associated with type 2 diabetes mellitus (HCC)    -Consent given for treatment as described below: -Examined patient. -Patient to continue soft, supportive shoe gear daily. -Toenails were debrided in length and girth 2-5 bilaterally with sterile nail nippers and dremel without iatrogenic bleeding.  -No invasive procedure(s) performed. Offending nail border debrided and curretaged both borders of left hallux and both borders of right hallux utilizing sterile nail nipper and currette. Border cleansed with alcohol and triple antibiotic ointment applied. No further treatment required by patient/caregiver. Call office if there are any concerns. -Patient/POA to call should there be question/concern in the interim.   Return in about 3 months (around 10/02/2023).  Melinda [name], DPM      [place] LOCATION:  [address] [place] [address]                   Office [phone]   [facility] LOCATION: [address] [place], [place] [address] Office [phone]

## 2023-07-07 ENCOUNTER — Telehealth: Payer: Self-pay | Admitting: Pharmacy Technician

## 2023-07-07 ENCOUNTER — Other Ambulatory Visit (HOSPITAL_COMMUNITY): Payer: Self-pay

## 2023-07-07 DIAGNOSIS — R269 Unspecified abnormalities of gait and mobility: Secondary | ICD-10-CM | POA: Diagnosis not present

## 2023-07-07 NOTE — Telephone Encounter (Signed)
 Pharmacy Patient Advocate Encounter   Received notification from CoverMyMeds that prior authorization for Aimovig  140mg /ml is due for renewal.   Insurance verification completed.   The patient is insured through [facility].  Action: PA required; PA submitted to above mentioned insurance via CoverMyMeds Key/confirmation #/EOC [id number] Status is pending

## 2023-07-08 ENCOUNTER — Other Ambulatory Visit (HOSPITAL_COMMUNITY): Payer: Self-pay

## 2023-07-08 DIAGNOSIS — R269 Unspecified abnormalities of gait and mobility: Secondary | ICD-10-CM | POA: Diagnosis not present

## 2023-07-08 NOTE — Telephone Encounter (Signed)
 Pharmacy Patient Advocate Encounter  Received notification from [facility] that Prior Authorization for Aimovig  140MG /ML has been APPROVED from 07/07/23 to 07/06/24. Ran test claim, Copay is $4. This test claim was processed through [facility]- copay amounts may vary at other pharmacies due to pharmacy/plan contracts, or as the patient moves through the different stages of their insurance plan.   PA #/Case ID/Reference #: PA Case ID #: [id number]

## 2023-07-09 DIAGNOSIS — R269 Unspecified abnormalities of gait and mobility: Secondary | ICD-10-CM | POA: Diagnosis not present

## 2023-07-10 DIAGNOSIS — R269 Unspecified abnormalities of gait and mobility: Secondary | ICD-10-CM | POA: Diagnosis not present

## 2023-07-11 DIAGNOSIS — R269 Unspecified abnormalities of gait and mobility: Secondary | ICD-10-CM | POA: Diagnosis not present

## 2023-07-13 DIAGNOSIS — G4733 Obstructive sleep apnea (adult) (pediatric): Secondary | ICD-10-CM | POA: Diagnosis not present

## 2023-07-14 DIAGNOSIS — R269 Unspecified abnormalities of gait and mobility: Secondary | ICD-10-CM | POA: Diagnosis not present

## 2023-07-15 DIAGNOSIS — R269 Unspecified abnormalities of gait and mobility: Secondary | ICD-10-CM | POA: Diagnosis not present

## 2023-07-16 DIAGNOSIS — R269 Unspecified abnormalities of gait and mobility: Secondary | ICD-10-CM | POA: Diagnosis not present

## 2023-07-17 DIAGNOSIS — R269 Unspecified abnormalities of gait and mobility: Secondary | ICD-10-CM | POA: Diagnosis not present

## 2023-07-18 DIAGNOSIS — R269 Unspecified abnormalities of gait and mobility: Secondary | ICD-10-CM | POA: Diagnosis not present

## 2023-07-21 DIAGNOSIS — R269 Unspecified abnormalities of gait and mobility: Secondary | ICD-10-CM | POA: Diagnosis not present

## 2023-07-22 DIAGNOSIS — R269 Unspecified abnormalities of gait and mobility: Secondary | ICD-10-CM | POA: Diagnosis not present

## 2023-07-22 DIAGNOSIS — H16143 Punctate keratitis, bilateral: Secondary | ICD-10-CM | POA: Diagnosis not present

## 2023-07-22 DIAGNOSIS — H04123 Dry eye syndrome of bilateral lacrimal glands: Secondary | ICD-10-CM | POA: Diagnosis not present

## 2023-07-23 DIAGNOSIS — R269 Unspecified abnormalities of gait and mobility: Secondary | ICD-10-CM | POA: Diagnosis not present

## 2023-07-24 DIAGNOSIS — R269 Unspecified abnormalities of gait and mobility: Secondary | ICD-10-CM | POA: Diagnosis not present

## 2023-07-25 DIAGNOSIS — F331 Major depressive disorder, recurrent, moderate: Secondary | ICD-10-CM | POA: Diagnosis not present

## 2023-07-25 DIAGNOSIS — F411 Generalized anxiety disorder: Secondary | ICD-10-CM | POA: Diagnosis not present

## 2023-07-25 DIAGNOSIS — F4310 Post-traumatic stress disorder, unspecified: Secondary | ICD-10-CM | POA: Diagnosis not present

## 2023-07-25 DIAGNOSIS — R269 Unspecified abnormalities of gait and mobility: Secondary | ICD-10-CM | POA: Diagnosis not present

## 2023-07-28 DIAGNOSIS — R269 Unspecified abnormalities of gait and mobility: Secondary | ICD-10-CM | POA: Diagnosis not present

## 2023-07-29 DIAGNOSIS — R269 Unspecified abnormalities of gait and mobility: Secondary | ICD-10-CM | POA: Diagnosis not present

## 2023-07-31 ENCOUNTER — Encounter: Payer: Self-pay | Admitting: Dermatology

## 2023-07-31 ENCOUNTER — Other Ambulatory Visit: Payer: Self-pay | Admitting: Obstetrics and Gynecology

## 2023-07-31 ENCOUNTER — Ambulatory Visit: Payer: Medicaid Other | Admitting: Dermatology

## 2023-07-31 VITALS — BP 108/73 | HR 87

## 2023-07-31 DIAGNOSIS — L219 Seborrheic dermatitis, unspecified: Secondary | ICD-10-CM

## 2023-07-31 NOTE — Patient Instructions (Addendum)
Dear Melinda [name],  Thank you for visiting [place] today. We are grateful for your dedication to enhancing your health and effectively managing your seborrheic dermatitis.  Here is a summary of the essential instructions from today's consultation:  Medications for Seborrheic Dermatitis:   Nystatin Triamcinolone Cream: Continue application for flare-ups, use for one week.   Ketoconazole Cream: Apply twice daily for three weeks if flares recur.  Skincare Recommendations:   Cleansing: Persist with Dove Sensitive Skin for washing.   Moisturizing: Transition to lighter moisturizers like La Roche-Posay Double Repair and Excedrin Advanced Repair as the weather warms, to prevent flare triggers. Utilize provided samples to find your preference.   Dry Patches: Apply Vaseline sparingly on affected areas only.  Eye Care:   Dry Eyes/Skin: Use Aquaphor Healing Ointment at night post-face wash.  Follow-Up:   Next Appointment: Schedule for September to strategize for the winter season and address potential flares. Feel free to visit sooner if complications occur.   We hope these guidelines will help you maintain healthy skin. Should you have any questions or require further assistance, please do not hesitate to reach out to our office.  Warm regards,  Dr. [facility]  Important Information  Due to recent changes in healthcare laws, you may see results of your pathology and/or laboratory studies on MyChart before the doctors have had a chance to review them. We understand that in some cases there may be results that are confusing or concerning to you. Please understand that not all results are received at the same time and often the doctors may need to interpret multiple results in order to provide you with the best plan of care or course of treatment. Therefore, we ask that you please give [place] 2 business days to thoroughly review all your results before contacting the office for clarification.  Should we see a critical lab result, you will be contacted sooner.   If You Need Anything After Your Visit  If you have any questions or concerns for your doctor, please call our main line at [phone] If no one answers, please leave a voicemail as directed and we will return your call as soon as possible. Messages left after 4 pm will be answered the following business day.   You may also send [place] a message via MyChart. We typically respond to MyChart messages within 1-2 business days.  For prescription refills, please ask your pharmacy to contact our office. Our fax number is [phone].  If you have an urgent issue when the clinic is closed that cannot wait until the next business day, you can page your doctor at the number below.    Please note that while we do our best to be available for urgent issues outside of office hours, we are not available 24/7.   If you have an urgent issue and are unable to reach [place], you may choose to seek medical care at your doctor's office, retail clinic, urgent care center, or emergency room.  If you have a medical emergency, please immediately call 911 or go to the emergency department. In the event of inclement weather, please call our main line at [phone] for an update on the status of any delays or closures.  Dermatology Medication Tips: Please keep the boxes that topical medications come in in order to help keep track of the instructions about where and how to use these. Pharmacies typically print the medication instructions only on the boxes and not directly on the medication tubes.  If your medication is too expensive, please contact our office at [phone] or send [place] a message through MyChart.   We are unable to tell what your co-pay for medications will be in advance as this is different depending on your insurance coverage. However, we may be able to find a substitute medication at lower cost or fill out paperwork to get insurance to  cover a needed medication.   If a prior authorization is required to get your medication covered by your insurance company, please allow [place] 1-2 business days to complete this process.  Drug prices often vary depending on where the prescription is filled and some pharmacies may offer cheaper prices.  The website www.goodrx.com contains coupons for medications through different pharmacies. The prices here do not account for what the cost may be with help from insurance (it may be cheaper with your insurance), but the website can give you the price if you did not use any insurance.  - You can print the associated coupon and take it with your prescription to the pharmacy.  - You may also stop by our office during regular business hours and pick up a GoodRx coupon card.  - If you need your prescription sent electronically to a different pharmacy, notify our office through [facility] or by phone at [phone]

## 2023-07-31 NOTE — Patient Instructions (Signed)
Hi Melinda [name], glad you are doing well-have a great afternoon!  Melinda [name] was given information about Medicaid Managed Care team care coordination services as a part of their [facility] Community Plan Medicaid benefit. Melinda [name] verbally consented to engagement with the [facility] Managed Care team.   If you are experiencing a medical emergency, please call 911 or report to your local emergency department or urgent care.   If you have a non-emergency medical problem during routine business hours, please contact your provider's office and ask to speak with a nurse.   For questions related to your [facility], please call: [phone] or visit the homepage here: [email or web address]  If you would like to schedule transportation through your [facility], please call the following number at least 2 days in advance of your appointment: [phone]   Rides for urgent appointments can also be made after hours by calling Member Services.  Call the Behavioral Health Crisis Line at [phone], at any time, 24 hours a day, 7 days a week. If you are in danger or need immediate medical attention call 911.  If you would like help to quit smoking, call 1-800-QUIT-NOW (2092593691) OR Espaol: 1-855-Djelo-Ya (1-324-401-0272) o para ms informacin haga clic aqu or Text READY to 536-644 to register via text  Melinda [name] - following are the goals we discussed in your visit today:   Goals Addressed             This Visit's Progress    Protect My Health         Timeframe:  Long-Range Goal Priority:  Medium Start Date:    07/14/20                         Expected End Date:  ongoing          Follow Up Date: 09/11/23  - schedule appointment for vaccines needed due to my age or health - schedule recommended health tests (blood work, mammogram, colonoscopy, pap test) - schedule and keep  appointment for annual check-up  07/31/23: DERM appt today, has upcoming ENDO appt   Patient verbalizes understanding of instructions and care plan provided today and agrees to view in MyChart. Active MyChart status and patient understanding of how to access instructions and care plan via MyChart confirmed with patient.     The Managed Medicaid care management team will reach out to the patient again over the next 45 business  days.  The  Patient  has been provided with contact information for the Managed Medicaid care management team and has been advised to call with any health related questions or concerns.     Following is a copy of your plan of care:  Care Plan : General Plan of Care (Adult)  Updates made by [name], RN since 07/31/2023 12:00 AM     Problem: Health Promotion or Disease Self-Management (General Plan of Care)   Priority: Medium  Onset Date: 10/20/2020     Long-Range Goal: Chronic Disease Management   Start Date: 07/14/2020  Expected End Date: 09/14/2023  Recent Progress: On track  Priority: Medium  Note:    Current Barriers:  Chronic Disease Management support and education needs related to DM, HTN, chronic pain, migraines, asthma, HLD, anxiety, depression Patient with anxiety and depression-has appointment with Psychiatrist  scheduled every 3 months, therapist every 2 weeks 07/31/23:  BP and BG stable-Has new caregiver now, Tuesdays  and Fridays from 11-6.  Breathing WNL.  Therapy continues.   Nurse Case Manager Clinical Goal(s):  Over the next 90 days, patient will attend all scheduled medical appointments:  Over the next 30 days, patient will work with CM team pharmacist to review medications  Interventions:  Inter-disciplinary care team collaboration (see longitudinal plan of care) Evaluation of current treatment plan  and patient's adherence to plan as established by provider. Reviewed medications with patient. Collaborated with pharmacy regarding  medications. Discussed plans with patient for ongoing care management follow up and provided patient with direct contact information for care management team Reviewed scheduled/upcoming provider appointments. Collaborated with SW for psychotherapy referral. SW referral for psychotherapy provider-completed Pharmacy referral for medication review-completed Collaborated with PCP for new BP cuff-completed  Asthma: (Status:New goal.) Long Term Goal Advised patient to track and manage Asthma triggers Provided education about and advised patient to utilize infection prevention strategies to reduce risk of respiratory infection Discussed the importance of adequate rest and management of fatigue with Asthma Screening for signs and symptoms of depression related to chronic disease state  Assessed social determinant of health barriers   Diabetes Interventions:  (Status:  New goal.) Long Term Goal Assessed patient's understanding of A1c goal: <7% Reviewed medications with patient and discussed importance of medication adherence Discussed plans with patient for ongoing care management follow up and provided patient with direct contact information for care management team Reviewed scheduled/upcoming provider appointments  Advised patient, providing education and rationale, to check cbg as directed  and record, calling provider  for findings outside established parameters Review of patient status, including review of consultants reports, relevant laboratory and other test results, and medications completed Screening for signs and symptoms of depression related to chronic disease state  Assessed social determinant of health barriers Lab Results  Component Value Date         HGBA1C                                                    6.7 02/27/23       HGBA1C                                                      6.2                                    09/26/22 06/16/23:  between 5 and 6 per patient on visit to  ENDO 11/27.  Hyperlipidemia Interventions:  (Status:  New goal.) Long Term Goal Medication review performed; medication list updated in electronic medical record.  Provider established cholesterol goals reviewed Counseled on importance of regular laboratory monitoring as prescribed Reviewed importance of limiting foods high in cholesterol Screening for signs and symptoms of depression related to chronic disease state Assessed social determinant of health barriers   Hypertension Interventions:  (Status:  New goal.) Long Term Goal Last practice recorded BP readings:  BP Readings from Last 3 Encounters:     04/15/23         130/80 07/31/23         108/73 01/06/23  100/64  Most recent eGFR/CrCl:  Lab Results  Component Value Date   EGFR 68 12/19/2020    No components found for: "CRCL"  Evaluation of current treatment plan related to hypertension self management and patient's adherence to plan as established by provider Reviewed medications with patient and discussed importance of compliance Discussed plans with patient for ongoing care management follow up and provided patient with direct contact information for care management team Advised patient, providing education and rationale, to monitor blood pressure daily and record, calling PCP for findings outside established parameters Reviewed scheduled/upcoming provider appointments including:  Screening for signs and symptoms of depression related to chronic disease state  Assessed social determinant of health barriers  Pain Interventions:  (Status:  New goal.) Long Term Goal Pain assessment performed Medications reviewed Reviewed provider established plan for pain management Discussed importance of adherence to all scheduled medical appointments Counseled on the importance of reporting any/all new or changed pain symptoms or management strategies to pain management provider Advised patient to report to care team affect of  pain on daily activities Reviewed with patient prescribed pharmacological and nonpharmacological pain relief strategies Screening for signs and symptoms of depression related to chronic disease state  Assessed social determinant of health barriers   Self Care Activities: Over the next 90 days, patient will:  -Self administers medications as prescribed Attends all scheduled provider appointments Calls pharmacy for medication refills Calls provider office for new concerns or questions Patient will locate meter to check blood sugars-completed Schedule eye provider appt.-completed Follow up with provider regarding blood sugars-completed 05/16/23:  patient to contact provider regarding dizziness-completed  Follow Up Plan: The Managed Medicaid care management team will reach out to the patient again over the next 45  business days.  The patient has been provided with contact information for the Managed Medicaid care management team and has been advised to call with any health related questions or concerns.

## 2023-07-31 NOTE — Patient Outreach (Signed)
Medicaid Managed Care   Nurse Care Manager Note  07/31/2023 Name:  Melinda [name] MRN:  [id number] DOB:  1965-07-27  Melinda [name] is an 58 y.o. year old female who is a primary patient of [place], [place], [place].  The [facility] Managed Care Coordination team was consulted for assistance with:    Chronic healthcare management needs, DM, HTN, chronic pain, migraines, anxiety/depression/PTSD, h/o PE, COPD. Asthma, SS, dermatitis  [name] was given information about Medicaid Managed Care Coordination team services today. Melinda [name] Patient agreed to services and verbal consent obtained.  Engaged with patient by telephone for follow up visit in response to provider referral for case management and/or care coordination services.   Patient is participating in a Managed Medicaid Plan:  Yes  Assessments/Interventions:  Review of past medical history, allergies, medications, health status, including review of consultants reports, laboratory and other test data, was performed as part of comprehensive evaluation and provision of chronic care management services.  SDOH (Social Drivers of Health) assessments and interventions performed: SDOH Interventions    Flowsheet Row Patient Outreach Telephone from 07/31/2023 in [place] [facility] POPULATION HEALTH DEPARTMENT Patient Outreach Telephone from 06/16/2023 in [place] POPULATION HEALTH DEPARTMENT Patient Outreach Telephone from 05/16/2023 in [place] POPULATION HEALTH DEPARTMENT Patient Outreach Telephone from 04/15/2023 in [place] POPULATION HEALTH DEPARTMENT Patient Outreach Telephone from 04/09/2023 in [place] POPULATION HEALTH DEPARTMENT Patient Outreach Telephone from 03/18/2023 in [place] POPULATION HEALTH DEPARTMENT  SDOH Interventions        Food Insecurity Interventions Intervention Not Indicated -- -- -- -- --  Housing Interventions -- -- -- Intervention Not Indicated -- Intervention Not Indicated  Transportation Interventions -- --  Intervention Not Indicated -- -- --  Utilities Interventions -- -- -- Intervention Not Indicated -- --  Financial Strain Interventions -- -- Intervention Not Indicated -- -- --  Stress Interventions -- -- -- -- Offered [employer], Provide Counseling Offered [employer], Provide Counseling  Social Connections Interventions -- Intervention Not Indicated -- -- -- --  Health Literacy Interventions Intervention Not Indicated -- -- -- -- --     Care Plan Allergies  Allergen Reactions   Ceftriaxone Anaphylaxis and Other (See Comments)    ROCEPHIN   Metformin Anaphylaxis    ALL   Penicillins Shortness Of Breath    Has patient had a PCN reaction causing immediate rash, facial/tongue/throat swelling, SOB or lightheadedness with hypotension: Yes Has patient had a PCN reaction causing severe rash involving mucus membranes or skin necrosis: No Has patient had a PCN reaction that required hospitalization No Has patient had a PCN reaction occurring within the last 10 years: No If all of the above answers are "NO", then may proceed with Cephalosporin use.    Shellfish Allergy Anaphylaxis   Shellfish-Derived Products Anaphylaxis   Citrus Rash   Flonase [Fluticasone Propionate]     Makes migraines worse   Gold-Containing Drug Products     HANDS ITCH   Metformin Hcl Other (See Comments)   Nickel     HANDS SWELL   Penicillin G Other (See Comments)   Medications Reviewed Today     Reviewed by [name], RN (Registered Nurse) on 07/31/23 at 1238  Med List Status: <None>   Medication Order Taking? Sig Documenting Provider Last Dose Status Informant  albuterol (PROVENTIL) (2.5 MG/3ML) 0.083% nebulizer solution [id number] No Take 3 mLs (2.5 mg total) by nebulization every 6 (six) hours as needed for wheezing or shortness  of breath. [name], MD Taking Active Self, Pharmacy Records  albuterol (VENTOLIN HFA) 108 [id number] Base) MCG/ACT inhaler [id number] No  Inhale 2 puffs into the lungs every 6 (six) hours as needed for wheezing or shortness of breath. [name], MD Taking Active Self, Pharmacy Records  apixaban (ELIQUIS) 5 MG TABS tablet [id number] No TAKE ONE TABLET BY MOUTH TWICE DAILY MORNING AND [name], MD Taking Active Self, Pharmacy Records  atorvastatin (LIPITOR) 20 MG tablet [id number] No TAKE ONE TABLET BY MOUTH ONCE DAILY AT BEDTIME [name], MD Taking Active Self, Pharmacy Records  budesonide-formoterol [facility]) 160-4.5 MCG/ACT inhaler [id number] No Inhale 2 puffs into the lungs 2 (two) times daily. [name], MD Taking Active Self, Pharmacy Records  buPROPion (WELLBUTRIN XL) 300 MG 24 hr tablet [id number] No Take 300 mg by mouth daily.  [provider] Taking Active Self, Pharmacy Records  busPIRone (BUSPAR) 10 MG tablet [id number] No Take 10 mg by mouth 2 (two) times daily. [provider] Taking Active Self, Pharmacy Records           Med Note [name], [facility]   Wed Feb 18, 2018 10:03 PM)    Cholecalciferol (VITAMIN D3) 10 MCG (400 UNIT) CAPS [id number] No TAKE ONE CAPSULE BY MOUTH ONCE DAILY (AM) [name], MD Taking Active Self, Pharmacy Records  cyclobenzaprine (FLEXERIL) 10 MG tablet [id number] No TAKE 1 TABLET (10 MG TOTAL) BY MOUTH 3 (THREE) TIMES DAILY AS NEEDED FOR MUSCLE SPASMS. [name], MD Taking Active Self, Pharmacy Records  doxepin [facility]) 50 MG capsule [id number] No Take 150 mg by mouth at bedtime. [provider] Taking Active Self, Pharmacy Records  Erenumab-aooe (AIMOVIG) 140 MG/ML SOAJ [id number] No Inject 140 mg into the skin every 28 (twenty-eight) days. [name], DO Taking Active   gabapentin (NEURONTIN) 300 MG capsule [id number] No TAKE ONE CAPSULE BY MOUTH THREE TIMES DAILY (AM+NOON+BEDTIME) [name], MD Taking Active Self, Pharmacy Records  ibuprofen (ADVIL) 600 MG tablet [id number] No Take 1 tablet (600 mg total) by  mouth every 6 (six) hours as needed. [name], [place] Taking Active   ketoconazole (NIZORAL) 2 % cream [id number] No Apply 1 Application topically daily. Apply to affected areas on face twice a day for 3 weeks after using nystatin-triamcinolone for 1 week. Breslin Burklow Piedra, DO Taking Active   levothyroxine (SYNTHROID) 137 MCG tablet [id number] No Take 274 mcg by mouth daily before breakfast. [provider] Taking Active Self, Pharmacy Records  loratadine (CLARITIN) 10 MG tablet [id number] No TAKE 1 TABLET (10 MG TOTAL) BY MOUTH DAILY (AM) [name], MD Taking Active Self, Pharmacy Records  metoprolol tartrate (LOPRESSOR) 50 MG tablet [id number] No TAKE ONE TABLET BY MOUTH TWICE DAILY (AM+BEDTIME)  Patient taking differently: Take 75 mg by mouth 2 (two) times daily. TAKE ONE TABLET BY MOUTH TWICE DAILY (AM+BEDTIME)   [name], MD Taking Active Self, Pharmacy Records  montelukast (SINGULAIR) 10 MG tablet [id number] No TAKE 1 TABLET BY MOUTH EVERY EVENING AT BEDTIME [name], MD Taking Active Self, Pharmacy Records  nitrofurantoin, macrocrystal-monohydrate, (MACROBID) 100 MG capsule [id number] No Take 1 capsule (100 mg total) by mouth 2 (two) times daily. [name], [place] Taking Active   nystatin-triamcinolone ointment [facility]) [id number] No Apply 1 Application topically 2 (two) times daily. Apply topically two times daily for five days Andy Allende Piedra, DO Taking Active Self, Pharmacy Records  ondansetron (ZOFRAN-ODT) 4 MG disintegrating tablet [id number] No Take  1 tablet (4 mg total) by mouth every 8 (eight) hours as needed for nausea or vomiting. [name], PA Taking Active   oxybutynin (DITROPAN-XL) 5 MG 24 hr tablet [id number] No Take 5 mg by mouth at bedtime. [provider] Taking Active   prazosin (MINIPRESS) 2 MG capsule [id number] No Take 6 mg by mouth at bedtime. [provider] Taking Active Self, Pharmacy Records  Semaglutide, 1 MG/DOSE,  2 MG/1.5ML SOPN [id number] No Inject 2 mg into the skin once a week. [provider] Taking Active Self, Pharmacy Records  topiramate (TOPAMAX) 200 MG tablet [id number] No Take 1 tablet (200 mg total) by mouth at bedtime. [name], DO Taking Active Self, Pharmacy Records  traZODone (DESYREL) 50 MG tablet [id number] No Take 75 mg by mouth at bedtime. [provider] Taking Active   VRAYLAR 3 MG capsule [id number] No Take 3 mg by mouth daily. 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   Abdominal pannus 08/21/2021   Hardening of the aorta (main artery of the heart) (HCC) 08/21/2021   At high risk for falls 04/20/2020   Hyperlipidemia 02/16/2020   Adrenal mass, right (HCC) 04/29/2019   Chronic cough 03/12/2018   Left shoulder pain 10/14/2016   Primary hypertension 10/14/2016   S/P thyroidectomy 06/27/2016   Chronic bilateral low back pain without sciatica 04/22/2016   Liver lesion 12/24/2015   Malignant tumor of thyroid gland (HCC) 11/16/2015   Controlled type 2 diabetes mellitus with complication, with long-term current use of insulin (HCC) 10/10/2015   Vision loss 08/25/2015   Decreased vision in both eyes 08/25/2015   Prolonged Q-T interval on ECG 08/19/2015   History of pulmonary embolism 08/10/2015   Mild persistent asthma 04/13/2015   Nonruptured cerebral aneurysm 04/13/2015   Fibroid, uterine    Morbid obesity (HCC) 11/11/2014    Atypical chest pain 11/08/2014   Sinus tachycardia 11/08/2014   Migraine 10/14/2014   Menorrhagia 09/08/2014   Conditions to be addressed/monitored per PCP order:  Chronic healthcare management needs, DM, HTN, chronic pain, migraines, anxiety/depression/PTSD, h/o PE, COPD. Asthma, SS, dermatitis  Care Plan : General Plan of Care (Adult)  Updates made by [name], RN since 07/31/2023 12:00 AM     Problem: Health Promotion or Disease Self-Management (General Plan of Care)   Priority: Medium  Onset Date: 10/20/2020     Long-Range Goal: Chronic Disease Management   Start Date: 07/14/2020  Expected End Date: 09/14/2023  Recent Progress: On track  Priority: Medium  Note:    Current Barriers:  Chronic Disease Management support and education needs related to DM, HTN, chronic pain, migraines, asthma, HLD, anxiety, depression Patient with anxiety and depression-has appointment with Psychiatrist  scheduled every 3 months, therapist every 2 weeks 07/31/23:  BP and BG stable-Has new caregiver now, Tuesdays and Fridays from 11-6.  Breathing WNL.  Therapy continues.   Nurse Case Manager Clinical Goal(s):  Over the next 90 days, patient will  attend all scheduled medical appointments:  Over the next 30 days, patient will work with CM team pharmacist to review medications  Interventions:  Inter-disciplinary care team collaboration (see longitudinal plan of care) Evaluation of current treatment plan  and patient's adherence to plan as established by provider. Reviewed medications with patient. Collaborated with pharmacy regarding medications. Discussed plans with patient for ongoing care management follow up and provided patient with direct contact information for care management team Reviewed scheduled/upcoming provider appointments. Collaborated with SW for psychotherapy referral. SW referral for psychotherapy provider-completed Pharmacy referral for medication  review-completed Collaborated with PCP for new BP cuff-completed  Asthma: (Status:New goal.) Long Term Goal Advised patient to track and manage Asthma triggers Provided education about and advised patient to utilize infection prevention strategies to reduce risk of respiratory infection Discussed the importance of adequate rest and management of fatigue with Asthma Screening for signs and symptoms of depression related to chronic disease state  Assessed social determinant of health barriers   Diabetes Interventions:  (Status:  New goal.) Long Term Goal Assessed patient's understanding of A1c goal: <7% Reviewed medications with patient and discussed importance of medication adherence Discussed plans with patient for ongoing care management follow up and provided patient with direct contact information for care management team Reviewed scheduled/upcoming provider appointments  Advised patient, providing education and rationale, to check cbg as directed  and record, calling provider  for findings outside established parameters Review of patient status, including review of consultants reports, relevant laboratory and other test results, and medications completed Screening for signs and symptoms of depression related to chronic disease state  Assessed social determinant of health barriers Lab Results  Component Value Date         HGBA1C                                                    6.7 02/27/23       HGBA1C                                                      6.2                                    09/26/22 06/16/23:  between 5 and 6 per patient on visit to ENDO 11/27.  Hyperlipidemia Interventions:  (Status:  New goal.) Long Term Goal Medication review performed; medication list updated in electronic medical record.  Provider established cholesterol goals reviewed Counseled on importance of regular laboratory monitoring as prescribed Reviewed importance of limiting foods high in  cholesterol Screening for signs and symptoms of depression related to chronic disease state Assessed social determinant of health barriers   Hypertension Interventions:  (Status:  New goal.) Long Term Goal Last practice recorded BP readings:  BP Readings from Last 3 Encounters:     04/15/23         130/80 07/31/23         108/73 01/06/23         100/64  Most recent eGFR/CrCl:  Lab Results  Component Value Date   EGFR 68 12/19/2020  No components found for: "CRCL"  Evaluation of current treatment plan related to hypertension self management and patient's adherence to plan as established by provider Reviewed medications with patient and discussed importance of compliance Discussed plans with patient for ongoing care management follow up and provided patient with direct contact information for care management team Advised patient, providing education and rationale, to monitor blood pressure daily and record, calling PCP for findings outside established parameters Reviewed scheduled/upcoming provider appointments including:  Screening for signs and symptoms of depression related to chronic disease state  Assessed social determinant of health barriers  Pain Interventions:  (Status:  New goal.) Long Term Goal Pain assessment performed Medications reviewed Reviewed provider established plan for pain management Discussed importance of adherence to all scheduled medical appointments Counseled on the importance of reporting any/all new or changed pain symptoms or management strategies to pain management provider Advised patient to report to care team affect of pain on daily activities Reviewed with patient prescribed pharmacological and nonpharmacological pain relief strategies Screening for signs and symptoms of depression related to chronic disease state  Assessed social determinant of health barriers   Self Care Activities: Over the next 90 days, patient will:  -Self administers  medications as prescribed Attends all scheduled provider appointments Calls pharmacy for medication refills Calls provider office for new concerns or questions Patient will locate meter to check blood sugars-completed Schedule eye provider appt.-completed Follow up with provider regarding blood sugars-completed 05/16/23:  patient to contact provider regarding dizziness-completed  Follow Up Plan: The Managed Medicaid care management team will reach out to the patient again over the next 45  business days.  The patient has been provided with contact information for the Managed Medicaid care management team and has been advised to call with any health related questions or concerns.    Follow Up:  Patient agrees to Care Plan and Follow-up.  Plan: The Managed Medicaid care management team will reach out to the patient again over the next 45 business  days. and The  Patient has been provided with contact information for the Managed Medicaid care management team and has been advised to call with any health related questions or concerns.  Date/time of next scheduled RN care management/care coordination outreach: 09/11/23 at 1030.

## 2023-07-31 NOTE — Progress Notes (Signed)
   Follow-Up Visit   Subjective  Melinda [name] is a 58 y.o. female who presents for the following: Seb Derm  Patient present today for follow up visit for Seb Derm. Patient was last evaluated on 05/01/23. At that continue nystatin triamcinolone cream for 1 week, then she was advised to apply ketoconazole cream, apply to affected areas on face BID for 3 weeks. Patient reports sxs are  improving . Patient denies medication changes. She washes with Dove Sensitive Skin and uses exfoliating gloves.  The following portions of the chart were reviewed this encounter and updated as appropriate: medications, allergies, medical history  Review of Systems:  No other skin or systemic complaints except as noted in HPI or Assessment and Plan.  Objective  Well appearing patient in no apparent distress; mood and affect are within normal limits.  A focused examination was performed of the following areas: Face  Relevant exam findings are noted in the Assessment and Plan.           Assessment & Plan   Seborrheic Dermatitis Assessment: Patient has shown significant improvement in seborrheic dermatitis on the face since the last visit in October, with reduced flaking. A flare-up occurred approximately one month after initial treatment but was managed successfully with the prescribed regimen. Currently, the skin appears healthy with no active flares.  Plan:   Continue current skincare routine with Dove Sensitive Skin cleanser.   Switch from Vaseline to La Roche-Posay Double Repair or Eucerin Advanced Repair moisturizer.   Use Aquaphor Healing Ointment around eyes at night for dry eye-related skin dryness.   Maintain refills of nystatin-triamcinolone and ketoconazole creams for future flares.   In case of a flare-up, use nystatin-triamcinolone for 1 week followed by ketoconazole for 3 weeks.   Schedule a follow-up appointment in September for winter preparation.   Return sooner if issues or flares  arise before the scheduled appointment.    Return if symptoms worsen or fail to improve.  I, Joanie Coddington, CMA, am acting as scribe for [employer], DO .   Documentation: I have reviewed the above documentation for accuracy and completeness, and I agree with the above.  Stasia Cavalier, am acting as scribe for Langston Reusing, DO.  Langston Reusing, DO

## 2023-08-04 DIAGNOSIS — R269 Unspecified abnormalities of gait and mobility: Secondary | ICD-10-CM | POA: Diagnosis not present

## 2023-08-05 DIAGNOSIS — R269 Unspecified abnormalities of gait and mobility: Secondary | ICD-10-CM | POA: Diagnosis not present

## 2023-08-06 DIAGNOSIS — R269 Unspecified abnormalities of gait and mobility: Secondary | ICD-10-CM | POA: Diagnosis not present

## 2023-08-07 DIAGNOSIS — R269 Unspecified abnormalities of gait and mobility: Secondary | ICD-10-CM | POA: Diagnosis not present

## 2023-08-08 DIAGNOSIS — R269 Unspecified abnormalities of gait and mobility: Secondary | ICD-10-CM | POA: Diagnosis not present

## 2023-08-11 DIAGNOSIS — R269 Unspecified abnormalities of gait and mobility: Secondary | ICD-10-CM | POA: Diagnosis not present

## 2023-08-12 DIAGNOSIS — R269 Unspecified abnormalities of gait and mobility: Secondary | ICD-10-CM | POA: Diagnosis not present

## 2023-08-13 DIAGNOSIS — G4733 Obstructive sleep apnea (adult) (pediatric): Secondary | ICD-10-CM | POA: Diagnosis not present

## 2023-08-13 DIAGNOSIS — G4700 Insomnia, unspecified: Secondary | ICD-10-CM | POA: Diagnosis not present

## 2023-08-13 DIAGNOSIS — R269 Unspecified abnormalities of gait and mobility: Secondary | ICD-10-CM | POA: Diagnosis not present

## 2023-08-14 DIAGNOSIS — R269 Unspecified abnormalities of gait and mobility: Secondary | ICD-10-CM | POA: Diagnosis not present

## 2023-08-15 DIAGNOSIS — R269 Unspecified abnormalities of gait and mobility: Secondary | ICD-10-CM | POA: Diagnosis not present

## 2023-08-16 ENCOUNTER — Other Ambulatory Visit: Payer: Self-pay

## 2023-08-16 ENCOUNTER — Emergency Department (HOSPITAL_COMMUNITY): Payer: Medicaid Other

## 2023-08-16 ENCOUNTER — Emergency Department (HOSPITAL_COMMUNITY)
Admission: EM | Admit: 2023-08-16 | Discharge: 2023-08-17 | Disposition: A | Discharge status: Home / Self Care | Payer: Medicaid Other | Attending: Emergency Medicine | Admitting: Emergency Medicine

## 2023-08-16 ENCOUNTER — Encounter (HOSPITAL_COMMUNITY): Payer: Self-pay | Admitting: Emergency Medicine

## 2023-08-16 DIAGNOSIS — R079 Chest pain, unspecified: Secondary | ICD-10-CM | POA: Diagnosis not present

## 2023-08-16 DIAGNOSIS — R0789 Other chest pain: Secondary | ICD-10-CM | POA: Insufficient documentation

## 2023-08-16 DIAGNOSIS — R059 Cough, unspecified: Secondary | ICD-10-CM | POA: Diagnosis not present

## 2023-08-16 DIAGNOSIS — R0602 Shortness of breath: Secondary | ICD-10-CM | POA: Diagnosis not present

## 2023-08-16 DIAGNOSIS — J449 Chronic obstructive pulmonary disease, unspecified: Secondary | ICD-10-CM | POA: Diagnosis not present

## 2023-08-16 DIAGNOSIS — Z85850 Personal history of malignant neoplasm of thyroid: Secondary | ICD-10-CM | POA: Diagnosis not present

## 2023-08-16 DIAGNOSIS — E039 Hypothyroidism, unspecified: Secondary | ICD-10-CM | POA: Insufficient documentation

## 2023-08-16 DIAGNOSIS — E119 Type 2 diabetes mellitus without complications: Secondary | ICD-10-CM | POA: Diagnosis not present

## 2023-08-16 DIAGNOSIS — J439 Emphysema, unspecified: Secondary | ICD-10-CM | POA: Diagnosis not present

## 2023-08-16 DIAGNOSIS — I10 Essential (primary) hypertension: Secondary | ICD-10-CM | POA: Insufficient documentation

## 2023-08-16 DIAGNOSIS — J90 Pleural effusion, not elsewhere classified: Secondary | ICD-10-CM | POA: Diagnosis not present

## 2023-08-16 DIAGNOSIS — I700 Atherosclerosis of aorta: Secondary | ICD-10-CM | POA: Diagnosis not present

## 2023-08-16 DIAGNOSIS — Z7901 Long term (current) use of anticoagulants: Secondary | ICD-10-CM | POA: Insufficient documentation

## 2023-08-16 DIAGNOSIS — Z87891 Personal history of nicotine dependence: Secondary | ICD-10-CM | POA: Diagnosis not present

## 2023-08-16 LAB — BASIC METABOLIC PANEL
Anion gap: 13 (ref 5–15)
BUN: 13 mg/dL (ref 6–20)
CO2: 21 mmol/L — ABNORMAL LOW (ref 22–32)
Calcium: 8.9 mg/dL (ref 8.9–10.3)
Chloride: 108 mmol/L (ref 98–111)
Creatinine, Ser: 1.13 mg/dL — ABNORMAL HIGH (ref 0.44–1.00)
GFR, Estimated: 57 mL/min — ABNORMAL LOW (ref >60)
Glucose, Bld: 96 mg/dL (ref 70–99)
Potassium: 3.6 mmol/L (ref 3.5–5.1)
Sodium: 142 mmol/L (ref 135–145)

## 2023-08-16 LAB — RESP PANEL BY RT-PCR (RSV, FLU A&B, COVID)  RVPGX2
Influenza A by PCR: NEGATIVE
Influenza B by PCR: NEGATIVE
Resp Syncytial Virus by PCR: NEGATIVE
SARS Coronavirus 2 by RT PCR: NEGATIVE

## 2023-08-16 LAB — CBC
HCT: 46.8 % — ABNORMAL HIGH (ref 36.0–46.0)
Hemoglobin: 15.1 g/dL — ABNORMAL HIGH (ref 12.0–15.0)
MCH: 30.1 pg (ref 26.0–34.0)
MCHC: 32.3 g/dL (ref 30.0–36.0)
MCV: 93.2 fL (ref 80.0–100.0)
Platelets: 234 10*3/uL (ref 150–400)
RBC: 5.02 MIL/uL (ref 3.87–5.11)
RDW: 13.7 % (ref 11.5–15.5)
WBC: 10.2 10*3/uL (ref 4.0–10.5)
nRBC: 0 % (ref 0.0–0.2)

## 2023-08-16 LAB — TROPONIN I (HIGH SENSITIVITY): Troponin I (High Sensitivity): 10 ng/L (ref <18)

## 2023-08-16 NOTE — ED Triage Notes (Signed)
BIB [facility], Pt states that she had CP that started earlier in the evening. Cough, congestion x 3 days. Hx of afib.

## 2023-08-16 NOTE — ED Provider Notes (Signed)
[place] EMERGENCY DEPARTMENT AT [facility] Provider Note  Arrival date/time:08/16/2023 10:54 PM  HPI/ROS   Melinda [name] is a 58 y.o. female with PMH significant for T2DM, HLD, history of DVT, A-fib on Eliquis, asthma, hypothyroidism who presents for chest pain  History is provided by patient. Patient endorses onset of chest pain earlier this evening while she was lying in bed.  The pain felt sharp right in the center of her chest.  She endorses that it feels similar to when she has had a PE in the past. She tried taking a few albuterol puffs and taking a hot shower which did improve her pain slightly.  However it is still present and persistent. She does have some shortness of breath associated with the pain, but not significant to her.  She currently takes Eliquis 5 mg twice daily.  She used to take a lower dose when she had DVTs, however developed a blood clot even while on Eliquis at that time and so her dose was increased.  She denies any nausea, vomiting, diarrhea, abdominal pain, lightheadedness/dizziness.  Denies heart palpitations.   A complete ROS was performed with pertinent positives/negatives noted above.   ED Course and Medical Decision Making   I personally reviewed the patient's vitals.  Assessment/Plan: This is a 58 year old patient with history of diabetes, hyperlipidemia, DVTs and PEs and A-fib on Eliquis who is presenting for chest pain that started this evening while she was in bed.  On for exam, patient is well-appearing in no acute distress, her vitals are reassuring. She does have history of pulmonary embolism even while taking Eliquis.  EKG without ischemic changes. Initial troponin is WNL at 10, will obtain repeat. HEAR score of 3.  Chest x-ray without signs of infiltrate, soft tissue injury or pneumothorax  Given patient's concerning history of pulmonary embolism even while on Eliquis, will obtain CTA PE.  Workup: Initial troponin 10 BMP  shows no electrolyte derangement, creatinine is 1.13, which is stable from 4 months ago. CBC shows no leukocytosis or anemia.   Patient remained in the department at the time that I departed and their workup was ongoing and care was signed out to the oncoming provider. Please see additional notes for documentation of their continued care.   Plan at time of handoff:  -f/u CTA imaging and delta troponin and reassess patient -if reassuring, can likely DC home   Disposition: Pending imaging  Clinical Impression:  1. Chest pain, unspecified type     Rx / DC Orders ED Discharge Orders     None       The plan for this patient was discussed with Dr. Jearld Fenton, who voiced agreement and who oversaw evaluation and treatment of this patient.   Clinical Complexity A medically appropriate history, review of systems, and physical exam was performed.  If decision rules were used in this patient's evaluation, they are listed below.   Click here for ABCD2, HEART and other calculatorsREFRESH Note before signing   Patient's presentation is most consistent with acute presentation with potential threat to life or bodily function.  Medical Decision Making Amount and/or Complexity of Data Reviewed Labs: ordered. Radiology: ordered.    Physical Exam and Medical History   Vitals:   08/16/23 2017 08/16/23 2018  BP: (!) 144/58   Pulse: 79   Temp: 98 F (36.7 C)   TempSrc: Oral   SpO2: 94%   Weight:  136.1 kg  Height:  5\' 10"  (1.778 m)  Physical Exam Vitals and nursing note reviewed.  Constitutional:      General: She is not in acute distress.    Appearance: She is well-developed.  HENT:     Head: Normocephalic and atraumatic.  Eyes:     Conjunctiva/sclera: Conjunctivae normal.  Cardiovascular:     Rate and Rhythm: Normal rate and regular rhythm.     Heart sounds: No murmur heard. Pulmonary:     Effort: Pulmonary effort is normal. No respiratory distress.     Breath sounds:  Normal breath sounds.  Abdominal:     Palpations: Abdomen is soft.     Tenderness: There is no abdominal tenderness.  Musculoskeletal:        General: No swelling.     Cervical back: Neck supple.  Skin:    General: Skin is warm and dry.     Capillary Refill: Capillary refill takes less than 2 seconds.  Neurological:     Mental Status: She is alert.  Psychiatric:        Mood and Affect: Mood normal.     Medical History: Allergies  Allergen Reactions   Ceftriaxone Anaphylaxis and Other (See Comments)    ROCEPHIN   Metformin Anaphylaxis    ALL   Penicillin G Shortness Of Breath   Penicillins Shortness Of Breath    Has patient had a PCN reaction causing immediate rash, facial/tongue/throat swelling, SOB or lightheadedness with hypotension: Yes Has patient had a PCN reaction causing severe rash involving mucus membranes or skin necrosis: No Has patient had a PCN reaction that required hospitalization No Has patient had a PCN reaction occurring within the last 10 years: No If all of the above answers are "NO", then may proceed with Cephalosporin use.    Shellfish Allergy Anaphylaxis   Shellfish-Derived Products Anaphylaxis   Citrus Rash   Flonase [Fluticasone Propionate]     Makes migraines worse   Gold-Containing Drug Products     HANDS ITCH   Nickel     HANDS SWELL   Past Medical History:  Diagnosis Date   ADHD (attention deficit hyperactivity disorder)    Arthritis    Asthma    Cancer (HCC)    Thyroid   Chicken pox AGE 17   COPD (chronic obstructive pulmonary disease) (HCC)    pt reported   Diabetes mellitus without complication (HCC)    Diverticulosis, sigmoid    GERD (gastroesophageal reflux disease)    Glaucoma    BOTH EYES, NO EYE DROPS   Glaucoma    History of blood transfusion 08/2014    2UNITS GIVEN AND IRON GIVEN   Hyperglycemia 09/09/2014   Hypertension    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64   HAD TO LEARN  TO WALK AGAIN   Iron deficiency anemia due to chronic blood loss 09/09/2014   Low TSH level 09/08/2014   Lumbar herniated disc    Menorrhagia 09/08/2014   Migraine    CLUSTER AND MIGRAINES   Multiple thyroid nodules    Ovarian mass, right 09/09/2014   PE (pulmonary embolism) 2016   Peripheral neuropathy    FINGER TIPS AND TOES NUMB SOME   Preeclampsia 1983   WITH PREGNANCY   PTSD (post-traumatic stress disorder)    Scoliosis    Seizures (HCC) AGE 17   NONE SINCE, HAD CHICKEN POX THEN    Past Surgical History:  Procedure Laterality Date   ANGIOGRAM TO LEG  08-13-15   RIGHT  CHOLECYSTECTOMY     IR GENERIC HISTORICAL  04/04/2016   IR RADIOLOGIST EVAL & MGMT 04/04/2016 Simonne Come, MD [facility] INTERV RAD   RADIOLOGY WITH ANESTHESIA N/A 07/17/2021   Procedure: MRI LUMBAR SPINE WITHOUT CONTRAST; MRI CERVICAL SPINE WITHOUT CONTRAST WITH ANESTHESIA;  Surgeon: Radiologist, Medication, MD;  Location: [facility] OR;  Service: Radiology;  Laterality: N/A;   THYROIDECTOMY N/A 11/17/2015   Procedure: TOTAL THYROIDECTOMY;  Surgeon: Darnell Level, MD;  Location: [facility] ORS;  Service: General;  Laterality: N/A;   UTERINE ARTERY EMBOLIZATION Bilateral 08/13/2015   Family History  Problem Relation Age of Onset   Diabetes Mother    Breast cancer Mother    CAD Mother    Hypertension Mother    Alcohol abuse Father    Hypertension Father    Breast cancer Maternal Aunt    Breast cancer Maternal Aunt    Kidney nephrosis Son    Diabetes Son    Breast cancer Other 82 - 16    Social History   Tobacco Use   Smoking status: Former    Current packs/day: 0.00    Average packs/day: 0.5 packs/day for 40.0 years (20.0 ttl pk-yrs)    Types: Cigarettes    Start date: 11/03/1974    Quit date: 11/03/2014    Years since quitting: 8.7    Passive exposure: Past   Smokeless tobacco: Never  Vaping Use   Vaping status: Never Used  Substance Use Topics   Alcohol use: No    Alcohol/week: 0.0 standard drinks of alcohol   Drug  use: No    Procedures   If procedures were preformed on this patient, they are listed below:  Procedures   -------- HPI and MDM generated using voice dictation software and may contain dictation errors. Please contact me for any clarification or with any questions.   Cephus Slater, MD Emergency Medicine PGY-2    Caron Presume, MD 08/17/23 1612    Loetta Rough, MD 08/18/23 [phone]

## 2023-08-17 ENCOUNTER — Emergency Department (HOSPITAL_COMMUNITY): Payer: Medicaid Other

## 2023-08-17 DIAGNOSIS — R0602 Shortness of breath: Secondary | ICD-10-CM | POA: Diagnosis not present

## 2023-08-17 DIAGNOSIS — J439 Emphysema, unspecified: Secondary | ICD-10-CM | POA: Diagnosis not present

## 2023-08-17 DIAGNOSIS — R079 Chest pain, unspecified: Secondary | ICD-10-CM | POA: Diagnosis not present

## 2023-08-17 LAB — TROPONIN I (HIGH SENSITIVITY): Troponin I (High Sensitivity): 10 ng/L (ref <18)

## 2023-08-17 MED ORDER — IOHEXOL 350 MG/ML SOLN
75.0000 mL | Freq: Once | INTRAVENOUS | Status: AC | PRN
  Administered 2023-08-17: 75 mL via INTRAVENOUS

## 2023-08-17 NOTE — Discharge Instructions (Signed)
Your evaluation in the emergency department was reassuring.  Continue your Eliquis as prescribed.  Follow-up with your primary care doctor for further evaluation of your symptoms.

## 2023-08-17 NOTE — ED Provider Notes (Signed)
1:10 AM Patient reassessed.  Sitting upright in the bed in no acute distress.  Appears comfortable.  Vital signs have been stable.  No hypoxia at rest.  Not requiring any supplemental oxygen.  Patient's delta troponin is stable, negative.  She underwent CTA of the chest which is negative for pulmonary embolus or other acute cardiopulmonary abnormality.  She is already chronically anticoagulated on Eliquis.  I have advised the patient to continue her daily prescribed medications.  Will have her follow-up with her primary care doctor for further evaluation of her chest pain.  Low suspicion for emergent process given reassuring ED evaluation.  Patient discharged in stable condition.   CT Angio Chest PE W and/or Wo Contrast Result Date: 08/17/2023 CLINICAL DATA:  Chest pain and shortness of breath EXAM: CT ANGIOGRAPHY CHEST WITH CONTRAST TECHNIQUE: Multidetector CT imaging of the chest was performed using the standard protocol during bolus administration of intravenous contrast. Multiplanar CT image reconstructions and MIPs were obtained to evaluate the vascular anatomy. RADIATION DOSE REDUCTION: This exam was performed according to the departmental dose-optimization program which includes automated exposure control, adjustment of the mA and/or kV according to patient size and/or use of iterative reconstruction technique. CONTRAST:  75mL OMNIPAQUE IOHEXOL 350 MG/ML SOLN COMPARISON:  Chest x-ray from the previous day. FINDINGS: Cardiovascular: Atherosclerotic calcifications of the aorta are noted. No aneurysmal dilatation or dissection is seen. The heart is at the upper limits of normal in size. The pulmonary artery shows a normal branching pattern bilaterally. No filling defect to suggest pulmonary embolism is noted. Mild coronary calcifications are noted. Mediastinum/Nodes: Thoracic inlet is within normal limits. No hilar or mediastinal adenopathy is noted. The esophagus as visualized is within normal limits.  Lungs/Pleura: Mild linear scarring is noted in the bases bilaterally. Lungs are otherwise well aerated. Small bleb is noted in the medial aspect of the left upper lobe. Upper Abdomen: Visualized upper abdomen demonstrates a stable partially calcified myelolipoma in the right adrenal gland. This measures approximately 6 cm in greatest dimension. Musculoskeletal: No chest wall abnormality. No acute or significant osseous findings. Review of the MIP images confirms the above findings. IMPRESSION: No evidence of pulmonary emboli. Stable right myelolipoma.  No follow-up is recommended. Aortic Atherosclerosis (ICD10-I70.0) and Emphysema (ICD10-J43.9). Electronically Signed   By: Alcide Clever M.D.   On: 08/17/2023 00:37   DG Chest 2 View Result Date: 08/16/2023 CLINICAL DATA:  chest pain EXAM: CHEST - 2 VIEW COMPARISON:  None Available. FINDINGS: Limited evaluation due to overlapping osseous structures and overlying soft tissues. Mildly enlarged cardiac silhouette. Otherwise the heart and mediastinal contours are within normal limits. Atherosclerotic plaque. No focal consolidation. No pulmonary edema. Small left pleural effusion. No pneumothorax. No acute osseous abnormality. IMPRESSION: 1. Small left pleural effusion. Limited evaluation due to overlapping osseous structures and overlying soft tissues. 2.  Aortic Atherosclerosis (ICD10-I70.0). Electronically Signed   By: Tish Frederickson M.D.   On: 08/16/2023 21:50      Antony Madura, PA-C 08/17/23 0111    Mesner, Barbara Cower, MD 08/17/23 [phone]

## 2023-08-18 DIAGNOSIS — R269 Unspecified abnormalities of gait and mobility: Secondary | ICD-10-CM | POA: Diagnosis not present

## 2023-08-19 DIAGNOSIS — G4733 Obstructive sleep apnea (adult) (pediatric): Secondary | ICD-10-CM | POA: Diagnosis not present

## 2023-08-19 DIAGNOSIS — R269 Unspecified abnormalities of gait and mobility: Secondary | ICD-10-CM | POA: Diagnosis not present

## 2023-08-20 DIAGNOSIS — R269 Unspecified abnormalities of gait and mobility: Secondary | ICD-10-CM | POA: Diagnosis not present

## 2023-08-21 DIAGNOSIS — R269 Unspecified abnormalities of gait and mobility: Secondary | ICD-10-CM | POA: Diagnosis not present

## 2023-08-22 DIAGNOSIS — R269 Unspecified abnormalities of gait and mobility: Secondary | ICD-10-CM | POA: Diagnosis not present

## 2023-08-25 DIAGNOSIS — R269 Unspecified abnormalities of gait and mobility: Secondary | ICD-10-CM | POA: Diagnosis not present

## 2023-08-26 DIAGNOSIS — Z794 Long term (current) use of insulin: Secondary | ICD-10-CM | POA: Diagnosis not present

## 2023-08-26 DIAGNOSIS — E1165 Type 2 diabetes mellitus with hyperglycemia: Secondary | ICD-10-CM | POA: Diagnosis not present

## 2023-08-26 DIAGNOSIS — Z85850 Personal history of malignant neoplasm of thyroid: Secondary | ICD-10-CM | POA: Diagnosis not present

## 2023-08-26 DIAGNOSIS — E039 Hypothyroidism, unspecified: Secondary | ICD-10-CM | POA: Diagnosis not present

## 2023-08-26 DIAGNOSIS — I700 Atherosclerosis of aorta: Secondary | ICD-10-CM | POA: Diagnosis not present

## 2023-08-26 DIAGNOSIS — C73 Malignant neoplasm of thyroid gland: Secondary | ICD-10-CM | POA: Diagnosis not present

## 2023-08-26 DIAGNOSIS — E278 Other specified disorders of adrenal gland: Secondary | ICD-10-CM | POA: Diagnosis not present

## 2023-08-26 DIAGNOSIS — I10 Essential (primary) hypertension: Secondary | ICD-10-CM | POA: Diagnosis not present

## 2023-08-26 DIAGNOSIS — Z0000 Encounter for general adult medical examination without abnormal findings: Secondary | ICD-10-CM | POA: Diagnosis not present

## 2023-08-26 DIAGNOSIS — E890 Postprocedural hypothyroidism: Secondary | ICD-10-CM | POA: Diagnosis not present

## 2023-08-26 DIAGNOSIS — I671 Cerebral aneurysm, nonruptured: Secondary | ICD-10-CM | POA: Diagnosis not present

## 2023-08-26 DIAGNOSIS — R269 Unspecified abnormalities of gait and mobility: Secondary | ICD-10-CM | POA: Diagnosis not present

## 2023-08-26 DIAGNOSIS — R1310 Dysphagia, unspecified: Secondary | ICD-10-CM | POA: Diagnosis not present

## 2023-08-27 DIAGNOSIS — R269 Unspecified abnormalities of gait and mobility: Secondary | ICD-10-CM | POA: Diagnosis not present

## 2023-09-01 DIAGNOSIS — R269 Unspecified abnormalities of gait and mobility: Secondary | ICD-10-CM | POA: Diagnosis not present

## 2023-09-02 DIAGNOSIS — D3501 Benign neoplasm of right adrenal gland: Secondary | ICD-10-CM | POA: Diagnosis not present

## 2023-09-02 DIAGNOSIS — E119 Type 2 diabetes mellitus without complications: Secondary | ICD-10-CM | POA: Diagnosis not present

## 2023-09-02 DIAGNOSIS — E270 Other adrenocortical overactivity: Secondary | ICD-10-CM | POA: Diagnosis not present

## 2023-09-02 DIAGNOSIS — Z8639 Personal history of other endocrine, nutritional and metabolic disease: Secondary | ICD-10-CM | POA: Diagnosis not present

## 2023-09-02 DIAGNOSIS — R269 Unspecified abnormalities of gait and mobility: Secondary | ICD-10-CM | POA: Diagnosis not present

## 2023-09-02 DIAGNOSIS — E890 Postprocedural hypothyroidism: Secondary | ICD-10-CM | POA: Diagnosis not present

## 2023-09-02 DIAGNOSIS — Z85850 Personal history of malignant neoplasm of thyroid: Secondary | ICD-10-CM | POA: Diagnosis not present

## 2023-09-03 DIAGNOSIS — R269 Unspecified abnormalities of gait and mobility: Secondary | ICD-10-CM | POA: Diagnosis not present

## 2023-09-04 DIAGNOSIS — R269 Unspecified abnormalities of gait and mobility: Secondary | ICD-10-CM | POA: Diagnosis not present

## 2023-09-05 DIAGNOSIS — R269 Unspecified abnormalities of gait and mobility: Secondary | ICD-10-CM | POA: Diagnosis not present

## 2023-09-08 DIAGNOSIS — R269 Unspecified abnormalities of gait and mobility: Secondary | ICD-10-CM | POA: Diagnosis not present

## 2023-09-09 DIAGNOSIS — R269 Unspecified abnormalities of gait and mobility: Secondary | ICD-10-CM | POA: Diagnosis not present

## 2023-09-10 DIAGNOSIS — R269 Unspecified abnormalities of gait and mobility: Secondary | ICD-10-CM | POA: Diagnosis not present

## 2023-09-10 DIAGNOSIS — G4733 Obstructive sleep apnea (adult) (pediatric): Secondary | ICD-10-CM | POA: Diagnosis not present

## 2023-09-11 ENCOUNTER — Other Ambulatory Visit: Payer: Self-pay | Admitting: Obstetrics and Gynecology

## 2023-09-11 DIAGNOSIS — R269 Unspecified abnormalities of gait and mobility: Secondary | ICD-10-CM | POA: Diagnosis not present

## 2023-09-11 NOTE — Patient Outreach (Signed)
 Medicaid Managed Care   Nurse Care Manager Note  09/11/2023 Name:  Melinda [name] MRN:  [id number] DOB:  Sep 01, 1965  Melinda [name] is an 58 y.o. year old female who is a primary patient of [place], [place], [place].  The [facility] Managed Care Coordination team was consulted for assistance with:    Chronic healthcare management needs, DM, HTN, chronic pain, migraines, anxiety/depression/PTSD, h/o PE, COPD, asthma, SS, dermatitis.   Ms. Polich was given information about Medicaid Managed Care Coordination team services today. Melinda [name] Patient agreed to services and verbal consent obtained.  Engaged with patient by telephone for follow up visit in response to provider referral for case management and/or care coordination services.   Patient is participating in a Managed Medicaid Plan:  Yes  Assessments/Interventions:  Review of past medical history, allergies, medications, health status, including review of consultants reports, laboratory and other test data, was performed as part of comprehensive evaluation and provision of chronic care management services.  SDOH (Social Drivers of Health) assessments and interventions performed: SDOH Interventions    Flowsheet Row Patient Outreach Telephone from 09/11/2023 in [place] POPULATION HEALTH DEPARTMENT Patient Outreach Telephone from 07/31/2023 in [place] POPULATION HEALTH DEPARTMENT Patient Outreach Telephone from 06/16/2023 in [place] POPULATION HEALTH DEPARTMENT Patient Outreach Telephone from 05/16/2023 in [place] POPULATION HEALTH DEPARTMENT Patient Outreach Telephone from 04/15/2023 in [place] POPULATION HEALTH DEPARTMENT Patient Outreach Telephone from 04/09/2023 in [place] POPULATION HEALTH DEPARTMENT  SDOH Interventions        Food Insecurity Interventions -- Intervention Not Indicated -- -- -- --  Housing Interventions -- -- -- -- Intervention Not Indicated --  Transportation Interventions -- -- -- Intervention Not  Indicated -- --  Utilities Interventions -- -- -- -- Intervention Not Indicated --  Alcohol Usage Interventions Intervention Not Indicated (Score <7) -- -- -- -- --  Depression Interventions/Treatment  Medication, Currently on Treatment -- -- -- -- --  Financial Strain Interventions -- -- -- Intervention Not Indicated -- --  Stress Interventions -- -- -- -- -- Offered [employer], Provide Counseling  Social Connections Interventions -- -- Intervention Not Indicated -- -- --  Health Literacy Interventions -- Intervention Not Indicated -- -- -- --     Care Plan Allergies  Allergen Reactions   Ceftriaxone Anaphylaxis and Other (See Comments)    ROCEPHIN   Metformin Anaphylaxis    ALL   Penicillin G Shortness Of Breath   Penicillins Shortness Of Breath    Has patient had a PCN reaction causing immediate rash, facial/tongue/throat swelling, SOB or lightheadedness with hypotension: Yes Has patient had a PCN reaction causing severe rash involving mucus membranes or skin necrosis: No Has patient had a PCN reaction that required hospitalization No Has patient had a PCN reaction occurring within the last 10 years: No If all of the above answers are "NO", then may proceed with Cephalosporin use.    Shellfish Allergy Anaphylaxis   Shellfish-Derived Products Anaphylaxis   Citrus Rash   Flonase [Fluticasone Propionate]     Makes migraines worse   Gold-Containing Drug Products     HANDS ITCH   Nickel     HANDS SWELL   Medications Reviewed Today     Reviewed by [name], RN (Registered Nurse) on 09/11/23 at 1048  Med List Status: <None>   Medication Order Taking? Sig Documenting Provider Last Dose Status Informant  albuterol (PROVENTIL) (2.5 MG/3ML) 0.083% nebulizer solution [id number] No Take 3 mLs (2.5  mg total) by nebulization every 6 (six) hours as needed for wheezing or shortness of breath. [name], MD 08/16/2023 Evening Active Self, Pharmacy Records   albuterol (VENTOLIN HFA) 108 (90 Base) MCG/ACT inhaler [id number] No Inhale 2 puffs into the lungs every 6 (six) hours as needed for wheezing or shortness of breath. [name], MD Past Week Active Self, Pharmacy Records  apixaban [facility]) 5 MG TABS tablet [id number] No TAKE ONE TABLET BY MOUTH TWICE DAILY MORNING AND [name], MD 08/16/2023  3:00 PM Active Self, Pharmacy Records  atorvastatin (LIPITOR) 20 MG tablet [id number] No TAKE ONE TABLET BY MOUTH ONCE DAILY AT BEDTIME [name], MD 08/15/2023 Bedtime Active Self, Pharmacy Records  budesonide-formoterol [facility]) 160-4.5 MCG/ACT inhaler [id number] No Inhale 2 puffs into the lungs 2 (two) times daily.  Patient taking differently: Inhale 2 puffs into the lungs 2 (two) times daily as needed (for shortness of breath).   [name], MD 08/13/2023 Active Self, Pharmacy Records  buPROPion (WELLBUTRIN XL) 300 MG 24 hr tablet [id number] No Take 300 mg by mouth daily.  [provider] 08/16/2023 Morning Active Self, Pharmacy Records  busPIRone (BUSPAR) 10 MG tablet [id number] No Take 10 mg by mouth 2 (two) times daily. [provider] 08/16/2023 Morning Active Self, Pharmacy Records           Med Note [name], [facility]   Wed Feb 18, 2018 10:03 PM)    Cholecalciferol (VITAMIN D3) 10 MCG (400 UNIT) CAPS [id number] No TAKE ONE CAPSULE BY MOUTH ONCE DAILY (AM) [name], MD 08/16/2023 Morning Active Self, Pharmacy Records  doxepin (SINEQUAN) 50 MG capsule [id number] No Take 150 mg by mouth at bedtime. [provider] 08/15/2023 Bedtime Active Self, Pharmacy Records  Erenumab-aooe (AIMOVIG) 140 MG/ML SOAJ [id number] No Inject 140 mg into the skin every 28 (twenty-eight) days. [name], DO Past Month Active Self, Pharmacy Records  gabapentin (NEURONTIN) 300 MG capsule [id number] No TAKE ONE CAPSULE BY MOUTH THREE TIMES DAILY (AM+NOON+BEDTIME) [name], MD 08/16/2023 Noon Active Self,  Pharmacy Records  ketoconazole (NIZORAL) 2 % cream [id number] No Apply 1 Application topically daily. Apply to affected areas on face twice a day for 3 weeks after using nystatin-triamcinolone for 1 week.  Patient taking differently: Apply 1 Application topically daily as needed for irritation. Apply to affected areas on face twice a day for 3 weeks after using nystatin-triamcinolone for 1 week.   Kirklin Mcduffee Piedra, DO Past Week Active Self, Pharmacy Records  levothyroxine (SYNTHROID) 137 MCG tablet [id number] No Take 274 mcg by mouth daily before breakfast. [provider] 08/16/2023 Morning Active Self, Pharmacy Records  loratadine (CLARITIN) 10 MG tablet [id number] No TAKE 1 TABLET (10 MG TOTAL) BY MOUTH DAILY (AM) [name], MD 08/16/2023 Morning Active Self, Pharmacy Records  metoprolol tartrate (LOPRESSOR) 50 MG tablet [id number] No TAKE ONE TABLET BY MOUTH TWICE DAILY (AM+BEDTIME)  Patient taking differently: Take 75 mg by mouth 2 (two) times daily. TAKE ONE TABLET BY MOUTH TWICE DAILY (AM+BEDTIME)   [name], MD 08/16/2023 Morning Active Self, Pharmacy Records  montelukast (SINGULAIR) 10 MG tablet [id number] No TAKE 1 TABLET BY MOUTH EVERY EVENING AT BEDTIME [name], MD 08/15/2023 Bedtime Active Self, Pharmacy Records  oxybutynin (DITROPAN-XL) 5 MG 24 hr tablet [id number] No Take 5 mg by mouth at bedtime. [provider] 08/15/2023 Bedtime Active Self, Pharmacy Records  prazosin (MINIPRESS) 5 MG capsule [id number] No Take 5 mg by mouth at bedtime. [provider] 08/15/2023 Bedtime Active Self, Pharmacy Records  Semaglutide, 1 MG/DOSE, 2 MG/1.5ML SOPN [id number] No Inject 2 mg into the skin once a week. [provider] 08/10/2023 Active Self, Pharmacy Records  topiramate (TOPAMAX) 200 MG tablet [id number] No Take 1 tablet (200 mg total) by mouth at bedtime. [name], DO 08/15/2023 Bedtime Active Self, Pharmacy Records  traZODone (DESYREL) 50 MG tablet  [id number] No Take 75 mg by mouth at bedtime. [provider] 08/15/2023 Bedtime Active Self, Pharmacy Records  VRAYLAR 3 MG capsule [id number] No Take 3 mg by mouth daily. [provider] 08/16/2023 Morning Active Self, Pharmacy Records           Patient Active Problem List   Diagnosis Date Noted   Postoperative hypothyroidism 01/21/2022   Esophageal dysphagia 01/21/2022   History of DVT (deep vein thrombosis) 01/21/2022   History of malignant neoplasm of thyroid 01/21/2022   Long term (current) use of anticoagulants 01/21/2022   Migraine without aura, not refractory 01/21/2022   Type 2 diabetes mellitus with hyperglycemia (HCC) 01/21/2022   Spinal stenosis excluding cervical region 11/21/2021   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   Abdominal pannus 08/21/2021   Hardening of the aorta (main artery of the heart) (HCC) 08/21/2021   At high risk for falls 04/20/2020   Hyperlipidemia 02/16/2020   Adrenal mass, right (HCC) 04/29/2019   Chronic cough 03/12/2018   Left shoulder pain 10/14/2016   Primary hypertension 10/14/2016   S/P thyroidectomy 06/27/2016   Chronic bilateral low back pain without sciatica 04/22/2016   Liver lesion 12/24/2015   Malignant tumor of thyroid gland (HCC) 11/16/2015   Controlled type 2 diabetes mellitus with complication, with long-term current use of insulin (HCC) 10/10/2015   Vision loss 08/25/2015   Decreased vision in both eyes 08/25/2015   Prolonged Q-T interval on ECG 08/19/2015   History of pulmonary embolism 08/10/2015   Mild persistent asthma 04/13/2015   Nonruptured cerebral aneurysm 04/13/2015   Fibroid, uterine    Morbid obesity (HCC) 11/11/2014   Atypical chest pain 11/08/2014   Sinus tachycardia 11/08/2014   Migraine 10/14/2014   Menorrhagia 09/08/2014   Conditions to be addressed/monitored per PCP order:  Chronic healthcare management needs, DM, HTN, chronic pain, migraines, anxiety/depression/PTSD, h/o PE,  COPD, asthma, SS, dermatitis.  Care Plan : General Plan of Care (Adult)  Updates made by [name], RN since 09/11/2023 12:00 AM     Problem: Health Promotion or Disease Self-Management (General Plan of Care)   Priority: Medium  Onset Date: 10/20/2020     Long-Range Goal: Chronic Disease Management   Start Date: 07/14/2020  Expected End Date: 12/12/2023  Recent Progress: On track  Priority: Medium  Note:    Current Barriers:  Chronic Disease Management support and education needs related to DM, HTN, chronic pain, migraines, asthma, HLD, anxiety, depression Patient with anxiety and depression-has appointment with Psychiatrist  scheduled every 3 months, therapist every 2 weeks 09/11/23:  Seen in ED 2/15 for chest pain-better now-no complaints today.  Continues to lose weight on Ozempic 2 mg. BP and BG stable.  Breathing WNL now after recovering from cold.  Therapy services continue.   Nurse Case Manager Clinical Goal(s):  Over the next 90 days, patient will attend all scheduled medical appointments:  Over the next 30 days, patient will work with CM team pharmacist to review medications  Interventions:  Inter-disciplinary care team collaboration (see longitudinal plan of care) Evaluation of current treatment plan  and  patient's adherence to plan as established by provider. Reviewed medications with patient. Collaborated with pharmacy regarding medications. Discussed plans with patient for ongoing care management follow up and provided patient with direct contact information for care management team Reviewed scheduled/upcoming provider appointments. Collaborated with SW for psychotherapy referral. SW referral for psychotherapy provider-completed Pharmacy referral for medication review-completed Collaborated with PCP for new BP cuff-completed  Asthma: (Status:New goal.) Long Term Goal Advised patient to track and manage Asthma triggers Provided education about and advised patient  to utilize infection prevention strategies to reduce risk of respiratory infection Discussed the importance of adequate rest and management of fatigue with Asthma Screening for signs and symptoms of depression related to chronic disease state  Assessed social determinant of health barriers   Diabetes Interventions:  (Status:  New goal.) Long Term Goal Assessed patient's understanding of A1c goal: <7% Reviewed medications with patient and discussed importance of medication adherence Discussed plans with patient for ongoing care management follow up and provided patient with direct contact information for care management team Reviewed scheduled/upcoming provider appointments  Advised patient, providing education and rationale, to check cbg as directed  and record, calling provider  for findings outside established parameters Review of patient status, including review of consultants reports, relevant laboratory and other test results, and medications completed Screening for signs and symptoms of depression related to chronic disease state  Assessed social determinant of health barriers Lab Results  Component Value Date         HGBA1C                                                    6.7 02/27/23       HGBA1C                                                      5.3                                    08/29/23 06/16/23:  between 5 and 6 per patient on visit to ENDO 11/27.  Hyperlipidemia Interventions:  (Status:  New goal.) Long Term Goal Medication review performed; medication list updated in electronic medical record.  Provider established cholesterol goals reviewed Counseled on importance of regular laboratory monitoring as prescribed Reviewed importance of limiting foods high in cholesterol Screening for signs and symptoms of depression related to chronic disease state Assessed social determinant of health barriers   Hypertension Interventions:  (Status:  New goal.) Long Term Goal Last  practice recorded BP readings:  BP Readings from Last 3 Encounters:     04/15/23         130/80 07/31/23         108/73 09/11/23         110/70  Most recent eGFR/CrCl:  Lab Results  Component Value Date   EGFR 68 12/19/2020    No components found for: "CRCL"  Evaluation of current treatment plan related to hypertension self management and patient's adherence to plan as established by provider Reviewed medications with patient and discussed importance of compliance Discussed plans with patient for  ongoing care management follow up and provided patient with direct contact information for care management team Advised patient, providing education and rationale, to monitor blood pressure daily and record, calling PCP for findings outside established parameters Reviewed scheduled/upcoming provider appointments including:  Screening for signs and symptoms of depression related to chronic disease state  Assessed social determinant of health barriers  Pain Interventions:  (Status:  New goal.) Long Term Goal Pain assessment performed Medications reviewed Reviewed provider established plan for pain management Discussed importance of adherence to all scheduled medical appointments Counseled on the importance of reporting any/all new or changed pain symptoms or management strategies to pain management provider Advised patient to report to care team affect of pain on daily activities Reviewed with patient prescribed pharmacological and nonpharmacological pain relief strategies Screening for signs and symptoms of depression related to chronic disease state  Assessed social determinant of health barriers   Self Care Activities: Over the next 90 days, patient will:  -Self administers medications as prescribed Attends all scheduled provider appointments Calls pharmacy for medication refills Calls provider office for new concerns or questions Patient will locate meter to check blood  sugars-completed Schedule eye provider appt.-completed Follow up with provider regarding blood sugars-completed 05/16/23:  patient to contact provider regarding dizziness-completed  Follow Up Plan: The Managed Medicaid care management team will reach out to the patient again over the next 45  business days.  The patient has been provided with contact information for the Managed Medicaid care management team and has been advised to call with any health related questions or concerns.    Follow Up:  Patient agrees to Care Plan and Follow-up.  Plan: The Managed Medicaid care management team will reach out to the patient again over the next 45 business  days. and The  Patient has been provided with contact information for the Managed Medicaid care management team and has been advised to call with any health related questions or concerns.  Date/time of next scheduled RN care management/care coordination outreach:  10/24/23 at 3

## 2023-09-11 NOTE — Patient Instructions (Signed)
 Visit Information  Melinda [name] was given information about Medicaid Managed Care team care coordination services as a part of their [facility] Community Plan Medicaid benefit. Melinda [name] verbally consented to engagement with the [facility] Managed Care team.   If you are experiencing a medical emergency, please call 911 or report to your local emergency department or urgent care.   If you have a non-emergency medical problem during routine business hours, please contact your provider's office and ask to speak with a nurse.   For questions related to your [facility], please call: [phone] or visit the homepage here: [email or web address]  If you would like to schedule transportation through your [facility], please call the following number at least 2 days in advance of your appointment: [phone]   Rides for urgent appointments can also be made after hours by calling Member Services.  Call the Behavioral Health Crisis Line at [phone], at any time, 24 hours a day, 7 days a week. If you are in danger or need immediate medical attention call 911.  If you would like help to quit smoking, call 1-800-QUIT-NOW ((916) 641-1725) OR Espaol: 1-855-Djelo-Ya (1-324-401-0272) o para ms informacin haga clic aqu or Text READY to 536-644 to register via text  Melinda [name] - following are the goals we discussed in your visit today:   Goals Addressed             This Visit's Progress    Protect My Health         Timeframe:  Long-Range Goal Priority:  Medium Start Date:    07/14/20                         Expected End Date:  ongoing          Follow Up Date: 10/27/23  - schedule appointment for vaccines needed due to my age or health - schedule recommended health tests (blood work, mammogram, colonoscopy, pap test) - schedule and keep appointment for annual check-up  09/11/23:  recent ENDO appt.  Podiatry in April   Patient verbalizes understanding of instructions and care plan provided today and agrees to view in MyChart. Active MyChart status and patient understanding of how to access instructions and care plan via MyChart confirmed with patient.     The Managed Medicaid care management team will reach out to the patient again over the next 45 business  days.  The  Patient   has been provided with contact information for the Managed Medicaid care management team and has been advised to call with any health related questions or concerns.   Kathi Der RNC-MNN< BSN [facility] RN Care Manager Direct Dial [phone]/LOV [phone] Website: [email or web address]   Following is a copy of your plan of care:  Care Plan : General Plan of Care (Adult)  Updates made by [name], RN since 09/11/2023 12:00 AM     Problem: Health Promotion or Disease Self-Management (General Plan of Care)   Priority: Medium  Onset Date: 10/20/2020     Long-Range Goal: Chronic Disease Management   Start Date: 07/14/2020  Expected End Date: 12/12/2023  Recent Progress: On track  Priority: Medium  Note:    Current Barriers:  Chronic Disease Management support and education needs related to DM, HTN, chronic pain, migraines, asthma, HLD, anxiety, depression Patient with anxiety and depression-has appointment with Psychiatrist  scheduled every 3 months, therapist every 2 weeks  09/11/23:  Seen in ED 2/15 for chest pain-better now-no complaints today.  Continues to lose weight on Ozempic 2 mg. BP and BG stable.  Breathing WNL now after recovering from cold.  Therapy services continue.   Nurse Case Manager Clinical Goal(s):  Over the next 90 days, patient will attend all scheduled medical appointments:  Over the next 30 days, patient will work with CM team pharmacist to review medications  Interventions:  Inter-disciplinary care team collaboration (see  longitudinal plan of care) Evaluation of current treatment plan  and patient's adherence to plan as established by provider. Reviewed medications with patient. Collaborated with pharmacy regarding medications. Discussed plans with patient for ongoing care management follow up and provided patient with direct contact information for care management team Reviewed scheduled/upcoming provider appointments. Collaborated with SW for psychotherapy referral. SW referral for psychotherapy provider-completed Pharmacy referral for medication review-completed Collaborated with PCP for new BP cuff-completed  Asthma: (Status:New goal.) Long Term Goal Advised patient to track and manage Asthma triggers Provided education about and advised patient to utilize infection prevention strategies to reduce risk of respiratory infection Discussed the importance of adequate rest and management of fatigue with Asthma Screening for signs and symptoms of depression related to chronic disease state  Assessed social determinant of health barriers   Diabetes Interventions:  (Status:  New goal.) Long Term Goal Assessed patient's understanding of A1c goal: <7% Reviewed medications with patient and discussed importance of medication adherence Discussed plans with patient for ongoing care management follow up and provided patient with direct contact information for care management team Reviewed scheduled/upcoming provider appointments  Advised patient, providing education and rationale, to check cbg as directed  and record, calling provider  for findings outside established parameters Review of patient status, including review of consultants reports, relevant laboratory and other test results, and medications completed Screening for signs and symptoms of depression related to chronic disease state  Assessed social determinant of health barriers Lab Results  Component Value Date         HGBA1C                                                     6.7 02/27/23       HGBA1C                                                      5.3                                    08/29/23 06/16/23:  between 5 and 6 per patient on visit to ENDO 11/27.  Hyperlipidemia Interventions:  (Status:  New goal.) Long Term Goal Medication review performed; medication list updated in electronic medical record.  Provider established cholesterol goals reviewed Counseled on importance of regular laboratory monitoring as prescribed Reviewed importance of limiting foods high in cholesterol Screening for signs and symptoms of depression related to chronic disease state Assessed social determinant of health barriers   Hypertension Interventions:  (Status:  New goal.) Long Term Goal Last practice recorded BP readings:  BP Readings from Last 3 Encounters:  04/15/23         130/80 07/31/23         108/73 09/11/23         110/70  Most recent eGFR/CrCl:  Lab Results  Component Value Date   EGFR 68 12/19/2020    No components found for: "CRCL"  Evaluation of current treatment plan related to hypertension self management and patient's adherence to plan as established by provider Reviewed medications with patient and discussed importance of compliance Discussed plans with patient for ongoing care management follow up and provided patient with direct contact information for care management team Advised patient, providing education and rationale, to monitor blood pressure daily and record, calling PCP for findings outside established parameters Reviewed scheduled/upcoming provider appointments including:  Screening for signs and symptoms of depression related to chronic disease state  Assessed social determinant of health barriers  Pain Interventions:  (Status:  New goal.) Long Term Goal Pain assessment performed Medications reviewed Reviewed provider established plan for pain management Discussed importance of adherence to all scheduled  medical appointments Counseled on the importance of reporting any/all new or changed pain symptoms or management strategies to pain management provider Advised patient to report to care team affect of pain on daily activities Reviewed with patient prescribed pharmacological and nonpharmacological pain relief strategies Screening for signs and symptoms of depression related to chronic disease state  Assessed social determinant of health barriers   Self Care Activities: Over the next 90 days, patient will:  -Self administers medications as prescribed Attends all scheduled provider appointments Calls pharmacy for medication refills Calls provider office for new concerns or questions Patient will locate meter to check blood sugars-completed Schedule eye provider appt.-completed Follow up with provider regarding blood sugars-completed 05/16/23:  patient to contact provider regarding dizziness-completed  Follow Up Plan: The Managed Medicaid care management team will reach out to the patient again over the next 45  business days.  The patient has been provided with contact information for the Managed Medicaid care management team and has been advised to call with any health related questions or concerns.

## 2023-09-12 DIAGNOSIS — R269 Unspecified abnormalities of gait and mobility: Secondary | ICD-10-CM | POA: Diagnosis not present

## 2023-09-15 DIAGNOSIS — R269 Unspecified abnormalities of gait and mobility: Secondary | ICD-10-CM | POA: Diagnosis not present

## 2023-09-16 DIAGNOSIS — F411 Generalized anxiety disorder: Secondary | ICD-10-CM | POA: Diagnosis not present

## 2023-09-16 DIAGNOSIS — R269 Unspecified abnormalities of gait and mobility: Secondary | ICD-10-CM | POA: Diagnosis not present

## 2023-09-16 DIAGNOSIS — F331 Major depressive disorder, recurrent, moderate: Secondary | ICD-10-CM | POA: Diagnosis not present

## 2023-09-16 DIAGNOSIS — F4310 Post-traumatic stress disorder, unspecified: Secondary | ICD-10-CM | POA: Diagnosis not present

## 2023-09-17 DIAGNOSIS — R269 Unspecified abnormalities of gait and mobility: Secondary | ICD-10-CM | POA: Diagnosis not present

## 2023-09-18 DIAGNOSIS — R269 Unspecified abnormalities of gait and mobility: Secondary | ICD-10-CM | POA: Diagnosis not present

## 2023-09-19 DIAGNOSIS — R269 Unspecified abnormalities of gait and mobility: Secondary | ICD-10-CM | POA: Diagnosis not present

## 2023-09-22 DIAGNOSIS — R269 Unspecified abnormalities of gait and mobility: Secondary | ICD-10-CM | POA: Diagnosis not present

## 2023-09-23 DIAGNOSIS — R269 Unspecified abnormalities of gait and mobility: Secondary | ICD-10-CM | POA: Diagnosis not present

## 2023-09-24 ENCOUNTER — Other Ambulatory Visit: Payer: Self-pay | Admitting: Internal Medicine

## 2023-09-24 DIAGNOSIS — R269 Unspecified abnormalities of gait and mobility: Secondary | ICD-10-CM | POA: Diagnosis not present

## 2023-09-24 DIAGNOSIS — E279 Disorder of adrenal gland, unspecified: Secondary | ICD-10-CM

## 2023-09-25 DIAGNOSIS — R269 Unspecified abnormalities of gait and mobility: Secondary | ICD-10-CM | POA: Diagnosis not present

## 2023-09-26 DIAGNOSIS — R269 Unspecified abnormalities of gait and mobility: Secondary | ICD-10-CM | POA: Diagnosis not present

## 2023-09-30 DIAGNOSIS — R269 Unspecified abnormalities of gait and mobility: Secondary | ICD-10-CM | POA: Diagnosis not present

## 2023-10-01 DIAGNOSIS — R269 Unspecified abnormalities of gait and mobility: Secondary | ICD-10-CM | POA: Diagnosis not present

## 2023-10-02 DIAGNOSIS — R269 Unspecified abnormalities of gait and mobility: Secondary | ICD-10-CM | POA: Diagnosis not present

## 2023-10-03 DIAGNOSIS — R269 Unspecified abnormalities of gait and mobility: Secondary | ICD-10-CM | POA: Diagnosis not present

## 2023-10-06 DIAGNOSIS — R269 Unspecified abnormalities of gait and mobility: Secondary | ICD-10-CM | POA: Diagnosis not present

## 2023-10-07 DIAGNOSIS — R269 Unspecified abnormalities of gait and mobility: Secondary | ICD-10-CM | POA: Diagnosis not present

## 2023-10-08 DIAGNOSIS — R269 Unspecified abnormalities of gait and mobility: Secondary | ICD-10-CM | POA: Diagnosis not present

## 2023-10-09 DIAGNOSIS — R269 Unspecified abnormalities of gait and mobility: Secondary | ICD-10-CM | POA: Diagnosis not present

## 2023-10-10 DIAGNOSIS — R269 Unspecified abnormalities of gait and mobility: Secondary | ICD-10-CM | POA: Diagnosis not present

## 2023-10-11 DIAGNOSIS — G4733 Obstructive sleep apnea (adult) (pediatric): Secondary | ICD-10-CM | POA: Diagnosis not present

## 2023-10-13 DIAGNOSIS — R269 Unspecified abnormalities of gait and mobility: Secondary | ICD-10-CM | POA: Diagnosis not present

## 2023-10-14 DIAGNOSIS — R269 Unspecified abnormalities of gait and mobility: Secondary | ICD-10-CM | POA: Diagnosis not present

## 2023-10-15 DIAGNOSIS — R269 Unspecified abnormalities of gait and mobility: Secondary | ICD-10-CM | POA: Diagnosis not present

## 2023-10-16 DIAGNOSIS — R269 Unspecified abnormalities of gait and mobility: Secondary | ICD-10-CM | POA: Diagnosis not present

## 2023-10-17 DIAGNOSIS — R269 Unspecified abnormalities of gait and mobility: Secondary | ICD-10-CM | POA: Diagnosis not present

## 2023-10-20 DIAGNOSIS — R269 Unspecified abnormalities of gait and mobility: Secondary | ICD-10-CM | POA: Diagnosis not present

## 2023-10-21 DIAGNOSIS — R269 Unspecified abnormalities of gait and mobility: Secondary | ICD-10-CM | POA: Diagnosis not present

## 2023-10-22 ENCOUNTER — Ambulatory Visit (INDEPENDENT_AMBULATORY_CARE_PROVIDER_SITE_OTHER): Payer: Medicaid Other | Admitting: Podiatry

## 2023-10-22 DIAGNOSIS — Z91199 Patient's noncompliance with other medical treatment and regimen due to unspecified reason: Secondary | ICD-10-CM

## 2023-10-22 DIAGNOSIS — R269 Unspecified abnormalities of gait and mobility: Secondary | ICD-10-CM | POA: Diagnosis not present

## 2023-10-24 ENCOUNTER — Other Ambulatory Visit: Payer: Self-pay

## 2023-10-24 ENCOUNTER — Encounter

## 2023-10-24 NOTE — Patient Outreach (Signed)
 Complex Care Management   Visit Note  10/24/2023  Name:  Melinda [name] MRN: [id number] DOB: 1966-02-17  Situation: Referral received for Complex Care Management related to  HTN, asthma, type 2 DM, HLD, anxiety, and depression  I obtained verbal consent from Patient.  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77   HAD TO LEARN TO WALK AGAIN   Iron deficiency anemia due to chronic blood loss 09/09/2014   Low TSH level 09/08/2014   Lumbar herniated disc    Menorrhagia 09/08/2014   Migraine    CLUSTER AND MIGRAINES   Multiple thyroid  nodules    Ovarian mass, right 09/09/2014   PE (pulmonary embolism) 2016   Peripheral neuropathy    FINGER TIPS AND TOES NUMB SOME   Preeclampsia 1983   WITH PREGNANCY   PTSD (post-traumatic stress disorder)    Scoliosis    Seizures (HCC) AGE 44   NONE SINCE, HAD CHICKEN POX THEN    Assessment: Patient Reported Symptoms:  Cognitive Cognitive Status: Able to follow simple commands, Alert and oriented to person, place, and time, Normal speech and language skills Cognitive/Intellectual Conditions Management [RPT]: None reported or documented in medical history or problem list      Neurological Neurological Review of Symptoms: Weakness (L leg weakness) Neurological Conditions: Headache (History of migrains managed well with  medication) Neurological Management Strategies: Medical device, Medication therapy  HEENT HEENT Symptoms Reported: Change or loss of hearing, Other: (feeling like her ears are popping) HEENT Conditions: Ear problem(s), Tooth problem(s), Vision problem(s) Tooth Problems: missing Vision Problems: blindness/vision loss HEENT Management Strategies: Medical device (Upper and lower dentures, reading glasses) HEENT Comment: Appointment 10/27/23 to get hearing tested Ear problem(s), Tooth problem(s), Vision problem(s)  Cardiovascular Cardiovascular Symptoms Reported: No symptoms reported Does patient have uncontrolled Hypertension?: No Cardiovascular Conditions: High blood cholesterol, Hypertension Cardiovascular Management Strategies: Medication therapy  Respiratory Respiratory Symptoms Reported: Productive cough, Shortness of breath (Thick, tan sputum worse first thing in the morning. Reports using Albuterol  inhaler more often, at least daily. Patient reports this does help.) Respiratory Conditions: Asthma, Sleep disordered breathing (Wears CPAP) Respiratory Comment: Advised to contact primary provider for worsening symptoms.  Endocrine Patient reports the following symptoms related to hypoglycemia or hyperglycemia : No symptoms reported Is patient diabetic?: Yes Is patient checking blood sugars at home?: Yes Endocrine Conditions: Diabetes, Thyroid  disorder Endocrine Management Strategies: Medication therapy Endocrine Comment: Patient reports upcoming appointment with Dr. Kathyanne Parkers  Gastrointestinal Gastrointestinal Symptoms Reported: Other (Last BM yesterday) Other Gastrointestinal Symptoms: Patient reports switching between and diarrhea and constipation. Does not take any medications for bowels. Additional Gastrointestinal Details: Appetite is "so so", no significant weight loss to report Gastrointestinal Conditions: Other Other Gastrointestinal Conditions: esophageal dysphagia Gastrointestinal  Management Strategies: Diet modification (Incorporates  soft foods, small bites to help with swallowing.) Nutrition Risk Screen (CP): Difficulty chewing/swallowing  Genitourinary Genitourinary Symptoms Reported: No symptoms reported    Integumentary Integumentary Symptoms Reported: Other Other Integumentary Symptoms: Seborrheic dermatitis primary on face Skin Conditions: Dermatitis Skin Management Strategies: Medication therapy  Musculoskeletal Musculoskelatal Symptoms Reviewed: Unsteady gait, Weakness (L leg weakness) Musculoskeletal Conditions: Back pain Musculoskeletal Management Strategies: Medical device, Exercise (Stretches for back) Musculoskeletal Comment: Patient reports previously working with PT but did not feel it helped much. Falls in the past year?: No Number of falls in past year: 1 or less Was there an injury with Fall?: No Fall Risk Category Calculator: 0 Patient Fall Risk Level: Low Fall Risk Patient at Risk for Falls Due to: Impaired balance/gait Fall risk Follow up: Falls evaluation completed, Education provided, Falls prevention discussed  Psychosocial Psychosocial Symptoms Reported: Anxiety - if selected complete GAD Behavioral Health Conditions: Depression, Anxiety Behavioral Management Strategies: Coping strategies, Counseling (Listening to music) Behavioral Health Comment: Pyschiatrist every 3 months Techniques to Cope with Loss/Stress/Change: Diversional activities Quality of Family Relationships: non-existent Do you feel physically threatened by others?: No      10/24/2023    3:23 PM  Depression screen PHQ 2/9  Decreased Interest 0  Down, Depressed, Hopeless 0  PHQ - 2 Score 0    Vitals:   10/24/23 1456  BP: (!) 140/70    Medications Reviewed Today     Reviewed by Valaria Garland, RN (Registered Nurse) on 10/24/23 at 1458  Med List Status: <None>   Medication Order Taking? Sig Documenting Provider Last Dose Status Informant  albuterol   (PROVENTIL ) (2.5 MG/3ML) 0.083% nebulizer solution [id number]  Take 3 mLs (2.5 mg total) by nebulization every 6 (six) hours as needed for wheezing or shortness of breath. Lawrance Presume, MD  Active Self, Pharmacy Records  albuterol  (VENTOLIN  HFA) 108 [phone] Base) MCG/ACT inhaler [id number]  Inhale 2 puffs into the lungs every 6 (six) hours as needed for wheezing or shortness of breath. Lawrance Presume, MD  Active Self, Pharmacy Records  apixaban  (ELIQUIS ) 5 MG TABS tablet [id number]  TAKE ONE TABLET BY MOUTH TWICE DAILY MORNING AND Larayne Platter, MD  Active Self, Pharmacy Records  atorvastatin  (LIPITOR) 20 MG tablet [id number]  TAKE ONE TABLET BY MOUTH ONCE DAILY AT BEDTIME Newlin, Enobong, MD  Active Self, Pharmacy Records  budesonide -formoterol  (SYMBICORT ) 160-4.5 MCG/ACT inhaler [id number]  Inhale 2 puffs into the lungs 2 (two) times daily.  Patient taking differently: Inhale 2 puffs into the lungs 2 (two) times daily as needed (for shortness of breath).   Lawrance Presume, MD  Active Self, Pharmacy Records  buPROPion (WELLBUTRIN XL) 300 MG 24 hr tablet [id number]  Take 300 mg by mouth daily.  [provider]  Active Self, Pharmacy Records  busPIRone  (BUSPAR ) 10 MG tablet [id number]  Take 10 mg by mouth 2 (two) times daily. [provider]  Active Self, Pharmacy Records           Med Note Nolan Battle, [facility]   Wed Feb 18, 2018 10:03 PM)    Cholecalciferol (VITAMIN D3) 10 MCG (400 UNIT) CAPS [id number]  TAKE ONE CAPSULE BY MOUTH ONCE DAILY (AM) Newlin, Enobong, MD  Active Self, Pharmacy Records  doxepin (SINEQUAN) 50 MG capsule [id number]  Take 150 mg by mouth at bedtime. [provider]  Active Self, Pharmacy Records  Erenumab -aooe (AIMOVIG ) 140 MG/ML SOAJ [id number]  Inject 140 mg into the skin every 28 (twenty-eight) days. Merriam Abbey,  DO  Active Self, Pharmacy Records  gabapentin  (NEURONTIN ) 300 MG capsule [id number]  TAKE ONE CAPSULE BY MOUTH THREE TIMES  DAILY (AM+NOON+BEDTIME) Lawrance Presume, MD  Active Self, Pharmacy Records  ketoconazole  (NIZORAL ) 2 % cream [id number]  Apply 1 Application topically daily. Apply to affected areas on face twice a day for 3 weeks after using nystatin -triamcinolone  for 1 week.  Patient taking differently: Apply 1 Application topically daily as needed for irritation. Apply to affected areas on face twice a day for 3 weeks after using nystatin -triamcinolone  for 1 week.   Dellar Fenton, DO  Active Self, Pharmacy Records  levothyroxine  (SYNTHROID ) 137 MCG tablet [id number]  Take 274 mcg by mouth daily before breakfast. [provider]  Active Self, Pharmacy Records  loratadine  (CLARITIN ) 10 MG tablet [id number]  TAKE 1 TABLET (10 MG TOTAL) BY MOUTH DAILY (AM) Joaquin Mulberry, MD  Active Self, Pharmacy Records  metoprolol  tartrate (LOPRESSOR ) 50 MG tablet [id number]  TAKE ONE TABLET BY MOUTH TWICE DAILY (AM+BEDTIME)  Patient taking differently: Take 75 mg by mouth 2 (two) times daily. TAKE ONE TABLET BY MOUTH TWICE DAILY (AM+BEDTIME)   Newlin, Enobong, MD  Active Self, Pharmacy Records  montelukast  (SINGULAIR ) 10 MG tablet [id number]  TAKE 1 TABLET BY MOUTH EVERY EVENING AT BEDTIME Newlin, Enobong, MD  Active Self, Pharmacy Records  oxybutynin (DITROPAN-XL) 5 MG 24 hr tablet [id number]  Take 5 mg by mouth at bedtime. [provider]  Active Self, Pharmacy Records  prazosin (MINIPRESS) 5 MG capsule [id number]  Take 5 mg by mouth at bedtime. [provider]  Active Self, Pharmacy Records  Semaglutide, 1 MG/DOSE, 2 MG/1.5ML SOPN [id number]  Inject 2 mg into the skin once a week. [provider]  Active Self, Pharmacy Records  topiramate  (TOPAMAX ) 200 MG tablet [id number]  Take 1 tablet (200 mg total) by mouth at bedtime. Merriam Abbey, DO  Active Self, Pharmacy Records  traZODone (DESYREL) 50 MG tablet [id number]  Take 75 mg by mouth at bedtime. [provider]  Active Self, Pharmacy  Records  VRAYLAR 3 MG capsule [id number]  Take 3 mg by mouth daily. [provider]  Active Self, Pharmacy Records            Recommendation:   Continue to take medications as prescribed Remember to check blood sugar as advised by your endocrinologist  Follow Up Plan:   Telephone follow up appointment date/time:  11/21/23 at 11 AM  Theodora Fish, RN MSN [place]  [facility] RN Care Manager Direct Dial: [phone]  Fax: [phone]

## 2023-10-24 NOTE — Patient Instructions (Signed)
 Visit Information  Ms. Humes was given information about Medicaid Managed Care team care coordination services as a part of their [facility] Community Plan Medicaid benefit. [name] verbally consented to engagement with the [facility] Managed Care team.   If you are experiencing a medical emergency, please call 911 or report to your local emergency department or urgent care.   If you have a non-emergency medical problem during routine business hours, please contact your provider's office and ask to speak with a nurse.   For questions related to your [facility], please call: [phone] or visit the homepage here: [email or web address]  If you would like to schedule transportation through your [facility], please call the following number at least 2 days in advance of your appointment: [phone]   Rides for urgent appointments can also be made after hours by calling Member Services.  Call the Behavioral Health Crisis Line at [phone], at any time, 24 hours a day, 7 days a week. If you are in danger or need immediate medical attention call 911.  If you would like help to quit smoking, call 1-800-QUIT-NOW ((503)738-0820) OR Espaol: 1-855-Djelo-Ya (1-324-401-0272) o para ms informacin haga clic aqu or Text READY to 536-644 to register via text  Ms. Brennan - following are the goals we discussed in your visit today:   Goals Addressed             This Visit's Progress    VBCI RN Care Plan   On track    Problems:  Chronic Disease Management support and education needs related to Asthma, DMII, and HTN  Goal: Over the next 30 days the Patient will attend all scheduled medical appointments: endocrinology as evidenced by completed visit notes uploaded to EMR        demonstrate Improved adherence to prescribed treatment plan for DMII as evidenced by monitoring blood sugar as  advised by provider demonstrate Ongoing health management independence as evidenced by continuing to take medications as prescribed and monitor blood sugar/blood pressure        take all medications exactly as prescribed and will call provider for medication related questions as evidenced by medication adherence    verbalize understanding of plan for management of Asthma as evidenced by contacting provider for new or worsening respiratory symptoms  Interventions:   Asthma: Advised patient to track and manage Asthma triggers Provided instruction about proper use of medications used for management of Asthma including inhalers Discussed the importance of adequate rest and management of fatigue with Asthma   Diabetes Interventions: Assessed patient's understanding of A1c goal: <7% Reviewed medications with patient and discussed importance of medication adherence Discussed plans with patient for ongoing care management follow up and provided patient with direct contact information for care management team Provided patient with written educational materials related to hypo and hyperglycemia and importance of correct treatment Reviewed scheduled/upcoming provider appointments including: endocrinology Advised patient, providing education and rationale, to check cbg   and record, calling endocrinology or primary provider for findings outside established parameters Lab Results  Component Value Date   HGBA1C 6.1 04/20/2021    Hypertension Interventions: Last practice recorded BP readings:  BP Readings from Last 3 Encounters:  10/24/23 (!) 140/70  08/17/23 139/84  07/31/23 108/73   Most recent eGFR/CrCl:  Lab Results  Component Value Date   EGFR 68 12/19/2020    No components found for: "CRCL"  Evaluation of current treatment plan related to hypertension self management  and patient's adherence to plan as established by provider Reviewed medications with patient and discussed importance of  compliance Discussed plans with patient for ongoing care management follow up and provided patient with direct contact information for care management team Advised patient, providing education and rationale, to monitor blood pressure daily and record, calling PCP for findings outside established parameters  Patient Self-Care Activities:  Attend all scheduled provider appointments Call provider office for new concerns or questions  Perform all self care activities independently  Take medications as prescribed   check blood sugar at prescribed times: as advised by endocrinology take the blood sugar log to all doctor visits check blood pressure 3 times per week keep a blood pressure log take blood pressure log to all doctor appointments take medications for blood pressure exactly as prescribed  Plan:  Telephone follow up appointment with care management team member scheduled for:  11/21/23 at 11 AM             Please see education materials related to Asthma education and action plan provided by MyChart link.  Patient verbalizes understanding of instructions and care plan provided today and agrees to view in MyChart. Active MyChart status and patient understanding of how to access instructions and care plan via MyChart confirmed with patient.     Telephone follow up appointment with Managed Medicaid care management team member scheduled for: 11/21/23 at 11 AM  Theodora Fish, RN MSN [place]  [facility] RN Care Manager Direct Dial: [phone]  Fax: [phone]   Following is a copy of your plan of care:  There are no care plans that you recently modified to display for this patient.   Asthma, Adult  Asthma is a condition that causes swelling and narrowing of the airways. These are the passages that lead from the nose and mouth down into the lungs. When asthma symptoms get worse it is called an asthma attack or flare. This can make it hard to breathe.  Asthma flares can range from minor to life-threatening. There is no cure for asthma, but medicines and lifestyle changes can help to control it. What are the causes? It is not known exactly what causes asthma, but certain things can cause asthma symptoms to get worse (triggers). What can trigger an asthma attack? Cigarette smoke. Mold. Dust. Your pet's skin flakes (dander). Cockroaches. Pollen. Air pollution (like household cleaners, wood smoke, smog, or Therapist, occupational). What are the signs or symptoms? Trouble breathing (shortness of breath). Coughing. Making high-pitched whistling sounds when you breathe, most often when you breathe out (wheezing). Chest tightness. Tiredness with little activity. Poor exercise tolerance. How is this treated? Controller medicines that help prevent asthma symptoms. Fast-acting reliever or rescue medicines. These give short-term relief of asthma symptoms. Allergy medicines if your attacks are brought on by allergens. Medicines to help control the body's defense (immune) system. Staying away from the things that cause asthma attacks. Follow these instructions at home: Avoiding triggers in your home Do not allow anyone to smoke in your home. Limit use of fireplaces and wood stoves. Get rid of pests (such as roaches and mice) and their droppings. Keep your home clean. Clean your floors. Dust regularly. Use cleaning products that do not smell. Wash bed sheets and blankets every week in hot water. Dry them in a dryer. Have someone vacuum when you are not home. Change your heating and air conditioning filters often. Use blankets that are made of polyester or cotton. General instructions Take over-the-counter  and prescription medicines only as told by your doctor. Do not smoke or use any products that contain nicotine  or tobacco. If you need help quitting, ask your doctor. Stay away from secondhand smoke. Avoid doing things outdoors when allergen counts  are high and when air quality is low. Warm up before you exercise. Take time to cool down after exercise. Use a peak flow meter as told by your doctor. A peak flow meter is a tool that measures how well your lungs are working. Keep track of the peak flow meter's readings. Write them down. Follow your asthma action plan. This is a written plan for taking care of your asthma and treating your attacks. Make sure you get all the shots (vaccines) that your doctor recommends. Ask your doctor about a flu shot and a pneumonia shot. Keep all follow-up visits. Contact a doctor if: You have wheezing, shortness of breath, or a cough even while taking medicine to prevent attacks. The mucus you cough up (sputum) is thicker than usual. The mucus you cough up changes from clear or white to yellow, green, gray, or is bloody. You have problems from the medicine you are taking, such as: A rash. Itching. Swelling. Trouble breathing. You need reliever medicines more than 2-3 times a week. Your peak flow reading is still at 50-79% of your personal best after following the action plan for 1 hour. You have a fever. Get help right away if: You seem to be worse and are not responding to medicine during an asthma attack. You are short of breath even at rest. You get short of breath when doing very little activity. You have trouble eating, drinking, or talking. You have chest pain or tightness. You have a fast heartbeat. Your lips or fingernails start to turn blue. You are light-headed or dizzy, or you faint. Your peak flow is less than 50% of your personal best. You feel too tired to breathe normally. These symptoms may be an emergency. Get help right away. Call 911. Do not wait to see if the symptoms will go away. Do not drive yourself to the hospital. Summary Asthma is a long-term (chronic) condition in which the airways get tight and narrow. An asthma attack can make it hard to breathe. Asthma cannot be  cured, but medicines and lifestyle changes can help control it. Make sure you understand how to avoid triggers and how and when to use your medicines. Avoid things that can cause allergy symptoms (allergens). These include animal skin flakes (dander) and pollen from trees or grass. Avoid things that pollute the air. These may include household cleaners, wood smoke, smog, or chemical odors. This information is not intended to replace advice given to you by your health care provider. Make sure you discuss any questions you have with your health care provider. Document Revised: 03/26/2021 Document Reviewed: 03/26/2021 Elsevier Patient Education  2024 Elsevier Inc.  Asthma Action Plan, Adult An asthma action plan helps you understand how to manage your asthma and what to do when you have an asthma attack. The action plan is a color-coded plan that lists the symptoms that indicate whether or not your condition is under control and what actions to take. If you have symptoms in the green zone, you are doing well. If you have symptoms in the yellow zone, you are having problems. If you have symptoms in the red zone, you need medical care right away. Follow the plan that you and your health care provider develop. Review your  plan with your health care provider at each visit. What triggers your asthma? Knowing the things that can trigger an asthma attack or make your asthma symptoms worse is very important. Talk to your health care provider about your asthma triggers and how to avoid them. Record your known asthma triggers here: _______________ What is your personal best peak flow reading? If you use a peak flow meter, determine your personal best reading. Record it here: _______________ Marrie Sizer zone This zone means that your asthma is under control. You may not have any symptoms while you are in the green zone. This means that you: Have no coughing or wheezing, even while you are working or playing. Sleep  through the night. Are breathing well. Have a peak flow reading that is above __________ (80% of your personal best or greater). If you are in the green zone, continue to manage your asthma as directed. Take these medicines every day: Controller medicine and dosage: _______________ Controller medicine and dosage: _______________ Controller medicine and dosage: _______________ Controller medicine and dosage: _______________ Before exercise, use this reliever or rescue medicine: _______________ Call your health care provider if you are using a reliever or rescue medicine more than 2-3 times a week. Yellow zone Symptoms in this zone mean that your condition may be getting worse. You may have symptoms that interfere with exercise, are noticeably worse after exposure to triggers, or are worse at the first sign of a cold (upper respiratory infection). These may include: Waking from sleep. Coughing, especially at night or first thing in the morning. Mild wheezing. Chest tightness. A peak flow reading that is __________ to __________ (50-79% of your personal best). If you have any of these symptoms: Add the following medicine to the ones that you use daily: Reliever or rescue medicine and dosage: _______________ Additional medicine and dosage: _______________ Call your health care provider if: You remain in the yellow zone for __________ hours. You are using a reliever or rescue medicine more than 2-3 times a week. Red zone Symptoms in this zone mean that you should get medical help right away. You will likely feel distressed and have symptoms at rest that restrict your activity. You are in the red zone if: You are breathing hard and quickly. Your nose opens wide, your ribs show, and your neck muscles become visible when you breathe in. Your lips, fingers, or toes are a bluish color. You have trouble speaking in full sentences. Your peak flow reading is less than __________ (less than 50% of  your personal best). Your symptoms do not improve within 15-20 minutes after you use your reliever or rescue medicine (bronchodilator). If you have any of these symptoms: These symptoms represent a serious problem that is an emergency. Do not wait to see if the symptoms will go away. Get medical help right away. Call your local emergency services (911 in the [place] Do not drive yourself to the hospital. Use your reliever or rescue medicine. Start a nebulizer treatment or take 2-4 puffs from a metered-dose inhaler with a spacer. Repeat this action every 15-20 minutes until help arrives. Where to find more information You can find more information about asthma from: Centers for Disease Control and Prevention: FootballExhibition.com.br American Lung Association: www.lung.org This information is not intended to replace advice given to you by your health care provider. Make sure you discuss any questions you have with your health care provider. Document Revised: 08/10/2020 Document Reviewed: 08/10/2020 Elsevier Patient Education  2024 [employer].

## 2023-10-27 ENCOUNTER — Encounter

## 2023-10-27 NOTE — Progress Notes (Signed)
 1. No-show for appointment   No show #1.

## 2023-10-28 DIAGNOSIS — F4310 Post-traumatic stress disorder, unspecified: Secondary | ICD-10-CM | POA: Diagnosis not present

## 2023-10-28 DIAGNOSIS — F331 Major depressive disorder, recurrent, moderate: Secondary | ICD-10-CM | POA: Diagnosis not present

## 2023-10-28 DIAGNOSIS — F411 Generalized anxiety disorder: Secondary | ICD-10-CM | POA: Diagnosis not present

## 2023-10-30 DIAGNOSIS — R269 Unspecified abnormalities of gait and mobility: Secondary | ICD-10-CM | POA: Diagnosis not present

## 2023-10-31 DIAGNOSIS — E890 Postprocedural hypothyroidism: Secondary | ICD-10-CM | POA: Diagnosis not present

## 2023-10-31 DIAGNOSIS — R269 Unspecified abnormalities of gait and mobility: Secondary | ICD-10-CM | POA: Diagnosis not present

## 2023-11-03 DIAGNOSIS — R269 Unspecified abnormalities of gait and mobility: Secondary | ICD-10-CM | POA: Diagnosis not present

## 2023-11-04 DIAGNOSIS — R269 Unspecified abnormalities of gait and mobility: Secondary | ICD-10-CM | POA: Diagnosis not present

## 2023-11-05 DIAGNOSIS — I10 Essential (primary) hypertension: Secondary | ICD-10-CM | POA: Diagnosis not present

## 2023-11-05 DIAGNOSIS — Z6841 Body Mass Index (BMI) 40.0 and over, adult: Secondary | ICD-10-CM | POA: Diagnosis not present

## 2023-11-05 DIAGNOSIS — G4700 Insomnia, unspecified: Secondary | ICD-10-CM | POA: Diagnosis not present

## 2023-11-05 DIAGNOSIS — G4733 Obstructive sleep apnea (adult) (pediatric): Secondary | ICD-10-CM | POA: Diagnosis not present

## 2023-11-07 DIAGNOSIS — R269 Unspecified abnormalities of gait and mobility: Secondary | ICD-10-CM | POA: Diagnosis not present

## 2023-11-10 DIAGNOSIS — R269 Unspecified abnormalities of gait and mobility: Secondary | ICD-10-CM | POA: Diagnosis not present

## 2023-11-11 DIAGNOSIS — R269 Unspecified abnormalities of gait and mobility: Secondary | ICD-10-CM | POA: Diagnosis not present

## 2023-11-12 DIAGNOSIS — R269 Unspecified abnormalities of gait and mobility: Secondary | ICD-10-CM | POA: Diagnosis not present

## 2023-11-13 DIAGNOSIS — R269 Unspecified abnormalities of gait and mobility: Secondary | ICD-10-CM | POA: Diagnosis not present

## 2023-11-14 DIAGNOSIS — R269 Unspecified abnormalities of gait and mobility: Secondary | ICD-10-CM | POA: Diagnosis not present

## 2023-11-15 DIAGNOSIS — R269 Unspecified abnormalities of gait and mobility: Secondary | ICD-10-CM | POA: Diagnosis not present

## 2023-11-17 DIAGNOSIS — R269 Unspecified abnormalities of gait and mobility: Secondary | ICD-10-CM | POA: Diagnosis not present

## 2023-11-18 DIAGNOSIS — R269 Unspecified abnormalities of gait and mobility: Secondary | ICD-10-CM | POA: Diagnosis not present

## 2023-11-21 ENCOUNTER — Other Ambulatory Visit: Payer: Self-pay

## 2023-11-21 NOTE — Patient Instructions (Signed)
 Visit Information  Melinda [name] was given information about Medicaid Managed Care team care coordination services as a part of their [facility] Community Plan Medicaid benefit. Melinda [name] verbally consented to engagement with the [facility] Managed Care team.   If you are experiencing a medical emergency, please call 911 or report to your local emergency department or urgent care.   If you have a non-emergency medical problem during routine business hours, please contact your provider's office and ask to speak with a nurse.   For questions related to your [facility], please call: [phone] or visit the homepage here: [email or web address]  If you would like to schedule transportation through your [facility], please call the following number at least 2 days in advance of your appointment: [phone]   Rides for urgent appointments can also be made after hours by calling Member Services.  Call the Behavioral Health Crisis Line at [phone], at any time, 24 hours a day, 7 days a week. If you are in danger or need immediate medical attention call 911.  If you would like help to quit smoking, call 1-800-QUIT-NOW ((520)871-5104) OR Espaol: 1-855-Djelo-Ya (7-253-664-4034) o para ms informacin haga clic aqu or Text READY to 742-595 to register via text  Melinda [name] - following are the goals we discussed in your visit today:   Goals Addressed             This Visit's Progress    COMPLETED: VBCI RN Care Plan   On track    Problems:  Chronic Disease Management support and education needs related to Asthma, DMII, and HTN  Goal: Over the next 30 days the Patient will demonstrate Ongoing adherence to prescribed treatment plan for DMII as evidenced by monitoring blood sugar as advised by provider demonstrate Ongoing health management independence as evidenced by continuing to take  medications as prescribed and monitor blood sugar/blood pressure        take all medications exactly as prescribed and will call provider for medication related questions as evidenced by medication adherence    verbalize understanding of plan for management of Asthma as evidenced by contacting provider for new or worsening respiratory symptoms  Interventions:   Asthma: Provided instruction about proper use of medications used for management of Asthma including inhalers Discussed the importance of adequate rest and management of fatigue with Asthma   Diabetes Interventions: Assessed patient's understanding of A1c goal: <7% Reviewed medications with patient and discussed importance of medication adherence Discussed plans with patient for ongoing care management follow up and provided patient with direct contact information for care management team Advised patient, providing education and rationale, to check cbg twice a day and record, calling endocrinology or primary provider for findings outside established parameters Lab Results  Component Value Date   HGBA1C 6.1 04/20/2021    Hypertension Interventions: Last practice recorded BP readings:  BP Readings from Last 3 Encounters:  10/24/23 (!) 140/70  08/17/23 139/84  07/31/23 108/73   Most recent eGFR/CrCl:  Lab Results  Component Value Date   EGFR 68 12/19/2020    No components found for: "CRCL"  Evaluation of current treatment plan related to hypertension self management and patient's adherence to plan as established by provider Reviewed medications with patient and discussed importance of compliance Discussed plans with patient for ongoing care management follow up and provided patient with direct contact information for care management team Advised patient, providing education and rationale, to monitor blood pressure daily  and record, calling PCP for findings outside established parameters  Patient Self-Care Activities:   Attend all scheduled provider appointments Call provider office for new concerns or questions  Perform all self care activities independently  Take medications as prescribed   check blood sugar at prescribed times: as advised by endocrinology take the blood sugar log to all doctor visits check blood pressure 3 times per week keep a blood pressure log take blood pressure log to all doctor appointments take medications for blood pressure exactly as prescribed  Plan:  No further follow up required: Patient has met care plan goals              Patient verbalizes understanding of instructions and care plan provided today and agrees to view in MyChart. Active MyChart status and patient understanding of how to access instructions and care plan via MyChart confirmed with patient.     No further follow up required: Patient has met care plan goals  Theodora Fish, RN MSN [place]  [facility] RN Care Manager Direct Dial: [phone]  Fax: [phone]  Following is a copy of your plan of care:  There are no care plans that you recently modified to display for this patient.

## 2023-11-21 NOTE — Patient Outreach (Signed)
 Complex Care Management   Visit Note  11/21/2023  Name:  Melinda [name] MRN: [id number] DOB: 04/10/1966  Situation: Referral received for Complex Care Management related to Diabetes with Complications and asthma, HTN I obtained verbal consent from Patient.  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58   HAD TO LEARN TO WALK AGAIN   Iron deficiency anemia due to chronic blood loss 09/09/2014   Low TSH level 09/08/2014   Lumbar herniated disc    Menorrhagia 09/08/2014   Migraine    CLUSTER AND MIGRAINES   Multiple thyroid  nodules    Ovarian mass, right 09/09/2014   PE (pulmonary embolism) 2016   Peripheral neuropathy    FINGER TIPS AND TOES NUMB SOME   Preeclampsia 1983   WITH PREGNANCY   PTSD (post-traumatic stress disorder)    Scoliosis    Seizures (HCC) AGE 58   NONE SINCE, HAD CHICKEN POX THEN    Assessment: Patient Reported Symptoms:  Cognitive Cognitive Status: Able to follow simple commands, Alert and oriented to person, place, and time, Normal speech and language skills Cognitive/Intellectual Conditions Management [RPT]: None reported or documented in medical history or problem list      Neurological Neurological Review of Symptoms: Other: [place] Neurological Symptoms/Conditions [RPT]: Patient reports a migraine for the past couple of days. This is the first time  in awhile she has had one. She typically just waits it out until migraine is relieved on it's own. Neurological Conditions: Headache Neurological Management Strategies: Adequate rest, Coping strategies  HEENT HEENT Symptoms Reported: Not assessed      Cardiovascular Cardiovascular Symptoms Reported: Not assessed    Respiratory Respiratory Symptoms Reported: No symptoms reported Additional Respiratory Details: Patient reports decreased use of Albuterol  since previous visit because she hasn't been spending as much time outside. Respiratory Conditions: Asthma, Sleep disordered breathing (Wears CPAP) Respiratory Comment: Patient has had visit with sleep medicine since previous CMRN assessment.  Endocrine Patient reports the following symptoms related to hypoglycemia or hyperglycemia : No symptoms reported Is patient diabetic?: Yes Is patient checking blood sugars at home?: Yes Endocrine Conditions: Diabetes Endocrine Management Strategies: Medication therapy Endocrine Comment: Patient has had appointment with endocrinologist since previous CMRN assessment  Gastrointestinal Gastrointestinal Symptoms Reported: Not assessed      Genitourinary Genitourinary Symptoms Reported: Not assessed    Integumentary Integumentary Symptoms Reported: Not assessed    Musculoskeletal Musculoskelatal Symptoms Reviewed: Not assessed        Psychosocial Psychosocial Symptoms Reported: Not assessed            10/24/2023    3:23 PM  Depression screen PHQ 2/9  Decreased Interest 0  Down, Depressed, Hopeless 0  PHQ - 2 Score 0    There were no vitals filed  for this visit.  Medications Reviewed Today     Reviewed by Valaria Garland, RN (Registered Nurse) on 11/21/23 at 1107  Med List Status: <None>   Medication Order Taking? Sig Documenting Provider Last Dose Status Informant  albuterol  (PROVENTIL ) (2.5 MG/3ML) 0.083% nebulizer solution [id number] No Take 3 mLs (2.5 mg total) by nebulization  every 6 (six) hours as needed for wheezing or shortness of breath. Lawrance Presume, MD Taking Active Self, Pharmacy Records  albuterol  (VENTOLIN  HFA) 108 [phone] Base) MCG/ACT inhaler [id number] No Inhale 2 puffs into the lungs every 6 (six) hours as needed for wheezing or shortness of breath. Lawrance Presume, MD Taking Active Self, Pharmacy Records  apixaban  (ELIQUIS ) 5 MG TABS tablet [id number] No TAKE ONE TABLET BY MOUTH TWICE DAILY MORNING AND Larayne Platter, MD Taking Active Self, Pharmacy Records  atorvastatin  (LIPITOR) 20 MG tablet [id number] No TAKE ONE TABLET BY MOUTH ONCE DAILY AT BEDTIME Newlin, Enobong, MD Taking Active Self, Pharmacy Records  budesonide -formoterol  (SYMBICORT ) 160-4.5 MCG/ACT inhaler [id number] No Inhale 2 puffs into the lungs 2 (two) times daily.  Patient taking differently: Inhale 2 puffs into the lungs 2 (two) times daily as needed (for shortness of breath).   Lawrance Presume, MD Taking Active Self, Pharmacy Records  buPROPion (WELLBUTRIN XL) 300 MG 24 hr tablet [id number] No Take 300 mg by mouth daily.  [provider] Taking Active Self, Pharmacy Records  busPIRone  (BUSPAR ) 10 MG tablet [id number] No Take 10 mg by mouth 2 (two) times daily. [provider] Taking Active Self, Pharmacy Records           Med Note Nolan Battle, [facility] I   Wed Feb 18, 2018 10:03 PM)    Cholecalciferol (VITAMIN D3) 10 MCG (400 UNIT) CAPS [id number] No TAKE ONE CAPSULE BY MOUTH ONCE DAILY (AM) Newlin, Enobong, MD Taking Active Self, Pharmacy Records  doxepin (SINEQUAN) 50 MG capsule [id number] No Take 150 mg by mouth at bedtime. [provider] Taking Active Self, Pharmacy Records  Erenumab -aooe (AIMOVIG ) 140 MG/ML SOAJ [id number] No Inject 140 mg into the skin every 28 (twenty-eight) days. Merriam Abbey, DO Taking Active Self, Pharmacy Records  gabapentin  (NEURONTIN ) 300 MG capsule [id number] No TAKE ONE CAPSULE BY MOUTH THREE TIMES DAILY (AM+NOON+BEDTIME)  Lawrance Presume, MD Taking Active Self, Pharmacy Records  ketoconazole  (NIZORAL ) 2 % cream [id number] No Apply 1 Application topically daily. Apply to affected areas on face twice a day for 3 weeks after using nystatin -triamcinolone  for 1 week.  Patient taking differently: Apply 1 Application topically daily as needed for irritation. Apply to affected areas on face twice a day for 3 weeks after using nystatin -triamcinolone  for 1 week.   Dellar Fenton, DO Taking Active Self, Pharmacy Records  levothyroxine  (SYNTHROID ) 137 MCG tablet [id number] No Take 274 mcg by mouth daily before breakfast. [provider] Taking Active Self, Pharmacy Records  loratadine  (CLARITIN ) 10 MG tablet [id number] No TAKE 1 TABLET (10 MG TOTAL) BY MOUTH DAILY (AM) Joaquin Mulberry, MD Taking Active Self, Pharmacy Records  metoprolol  tartrate (LOPRESSOR ) 50 MG tablet [id number] No TAKE ONE TABLET BY MOUTH TWICE DAILY (AM+BEDTIME)  Patient taking differently: Take 75 mg by mouth 2 (two) times daily. TAKE ONE TABLET BY MOUTH TWICE DAILY (AM+BEDTIME)   Joaquin Mulberry, MD Taking Active Self, Pharmacy Records  montelukast  (SINGULAIR ) 10 MG tablet [id number] No TAKE 1 TABLET BY MOUTH EVERY EVENING AT BEDTIME Newlin, Enobong, MD Taking Active Self, Pharmacy Records  oxybutynin (DITROPAN-XL) 5 MG  24 hr tablet [id number] No Take 5 mg by mouth at bedtime. [provider] Taking Active Self, Pharmacy Records  prazosin (MINIPRESS) 5 MG capsule [id number] No Take 5 mg by mouth at bedtime. [provider] Taking Active Self, Pharmacy Records  Semaglutide, 1 MG/DOSE, 2 MG/1.5ML SOPN [id number] No Inject 2 mg into the skin once a week. [provider] Taking Active Self, Pharmacy Records  topiramate  (TOPAMAX ) 200 MG tablet [id number] No Take 1 tablet (200 mg total) by mouth at bedtime. Merriam Abbey, DO Taking Active Self, Pharmacy Records  traZODone (DESYREL) 50 MG tablet [id number] No Take 75 mg by mouth at  bedtime.  Patient not taking: Reported on 10/24/2023   [provider] Not Taking Active Self, Pharmacy Records  VRAYLAR 3 MG capsule [id number] No Take 3 mg by mouth daily. [provider] Taking Active Self, Pharmacy Records            Recommendation:   Continue to follow with primary provider and specialist providers as scheduled. Continue to take medications as prescribed.  Follow Up Plan:   Patient has met all care management goals. Care Management case will be closed. Patient has been provided contact information should new needs arise.   Theodora Fish, RN MSN [place]  [facility] Population Health RN Care Manager Direct Dial: [phone]  Fax: [phone]

## 2023-11-30 DIAGNOSIS — R269 Unspecified abnormalities of gait and mobility: Secondary | ICD-10-CM | POA: Diagnosis not present

## 2023-12-01 DIAGNOSIS — R269 Unspecified abnormalities of gait and mobility: Secondary | ICD-10-CM | POA: Diagnosis not present

## 2023-12-02 DIAGNOSIS — R269 Unspecified abnormalities of gait and mobility: Secondary | ICD-10-CM | POA: Diagnosis not present

## 2023-12-03 DIAGNOSIS — R269 Unspecified abnormalities of gait and mobility: Secondary | ICD-10-CM | POA: Diagnosis not present

## 2023-12-04 DIAGNOSIS — R269 Unspecified abnormalities of gait and mobility: Secondary | ICD-10-CM | POA: Diagnosis not present

## 2023-12-05 DIAGNOSIS — R269 Unspecified abnormalities of gait and mobility: Secondary | ICD-10-CM | POA: Diagnosis not present

## 2023-12-08 DIAGNOSIS — R269 Unspecified abnormalities of gait and mobility: Secondary | ICD-10-CM | POA: Diagnosis not present

## 2023-12-09 DIAGNOSIS — R269 Unspecified abnormalities of gait and mobility: Secondary | ICD-10-CM | POA: Diagnosis not present

## 2023-12-10 DIAGNOSIS — R269 Unspecified abnormalities of gait and mobility: Secondary | ICD-10-CM | POA: Diagnosis not present

## 2023-12-11 DIAGNOSIS — R269 Unspecified abnormalities of gait and mobility: Secondary | ICD-10-CM | POA: Diagnosis not present

## 2023-12-12 DIAGNOSIS — R269 Unspecified abnormalities of gait and mobility: Secondary | ICD-10-CM | POA: Diagnosis not present

## 2023-12-15 DIAGNOSIS — R269 Unspecified abnormalities of gait and mobility: Secondary | ICD-10-CM | POA: Diagnosis not present

## 2023-12-16 DIAGNOSIS — R269 Unspecified abnormalities of gait and mobility: Secondary | ICD-10-CM | POA: Diagnosis not present

## 2023-12-19 DIAGNOSIS — G4733 Obstructive sleep apnea (adult) (pediatric): Secondary | ICD-10-CM | POA: Diagnosis not present

## 2023-12-22 ENCOUNTER — Other Ambulatory Visit: Payer: Self-pay | Admitting: Neurology

## 2023-12-22 DIAGNOSIS — F411 Generalized anxiety disorder: Secondary | ICD-10-CM | POA: Diagnosis not present

## 2023-12-22 DIAGNOSIS — F4310 Post-traumatic stress disorder, unspecified: Secondary | ICD-10-CM | POA: Diagnosis not present

## 2023-12-22 DIAGNOSIS — F331 Major depressive disorder, recurrent, moderate: Secondary | ICD-10-CM | POA: Diagnosis not present

## 2023-12-30 DIAGNOSIS — R269 Unspecified abnormalities of gait and mobility: Secondary | ICD-10-CM | POA: Diagnosis not present

## 2023-12-31 DIAGNOSIS — R269 Unspecified abnormalities of gait and mobility: Secondary | ICD-10-CM | POA: Diagnosis not present

## 2024-01-01 DIAGNOSIS — R269 Unspecified abnormalities of gait and mobility: Secondary | ICD-10-CM | POA: Diagnosis not present

## 2024-01-02 DIAGNOSIS — R269 Unspecified abnormalities of gait and mobility: Secondary | ICD-10-CM | POA: Diagnosis not present

## 2024-01-05 DIAGNOSIS — R269 Unspecified abnormalities of gait and mobility: Secondary | ICD-10-CM | POA: Diagnosis not present

## 2024-01-05 NOTE — Progress Notes (Unsigned)
 NEUROLOGY FOLLOW UP OFFICE NOTE  Melinda [name] [id number]  Assessment/Plan:   1.  Suspect cervicogenic dizziness, also consider BPPV 2.  Migraine without aura, without status migrainosus, not intractable - stable 3.  Cervical spinal stenosis 4.  Cerebral aneurysm - stable    Migraine prevention:  Restart Aimovig  140mg .  Prior authorization received in January stated it was active until January 2026.  Will look into it; topiramate  200mg  at bedtime.   2.  Migraine rescue: Tylenol  with or without Flexeril  3.  Limit use of pain relievers to no more than 2 days out of week to prevent risk of rebound or medication-overuse headache. 4.  Keep headache diary 5.  Repeat CTA vs MRA of head in 3 years unless indicated sooner 6.  Follow up one year.   Subjective:  Melinda [name] is a 58 year old right-handed African-American woman with type 2 diabetes mellitus, glaucoma, asthma, chronic low back pain, PTSD, PE/DVT and history of thyroid  cancer status post thyroidectomy who follows up for migraines.  MRI C-spine personally reviewed.   UPDATE: She had been doing well Intensity:  Moderate Duration:  1 hour with Tylenol  w/wo Flexeril  Frequency:  no more than 1 a month.    However, she has not been able to get the Aimovig  for several months.  Unsure why because we did get response that it was approved by her insurance.  Also, she is under more stress in school and her depression as worsened as well. Frequency:  1 a week.    Seen in ED in April for weakness and lightheadedness causing fall.  CT head personally reviewed was unremarkable.  Found to have UTI and dehydrated.  Treated accordingly.  Current NSAIDS:  Contraindicated (on anticoagulation) Current analgesics:  Tylenol , tramadol  Current triptans:  none Current ergotamine:  none Current anti-emetic:  Zofran  ODT 4mg  Current muscle relaxants:  Flexeril  Current anti-anxiolytic:  none Current sleep aide:  none Current Antihypertensive  medications:  Metoprolol  50mg  twice daily, Lasix , prazosin Current Antidepressant medications:  Wellbutrin 300mg , doxepin 10mg  Current Anticonvulsant medications:  topiramate  200 mg at bedtime, gabapentin  300mg  three times daily Current anti-CGRP:  Aimovig  140mg  Current Vitamins/Herbal/Supplements:  Ferrous sulfate  Current Antihistamines/Decongestants:  Claritin , Benadryl  Other therapy:  Essential oils, lavender  Sees psychiatrist and psychologist which helps   Caffeine : No Alcohol: No Smoker: No Diet: Hydrates.  No soda.  Eats baked and boiled chicken, mostly vegetables.  Little red meat only during menses.  No fried foods. Exercise: Stationary bike, limited due to chronic pain Depression: Yes; Anxiety: Yes Other pain: Chronic pain - spinal stenosis of lumbar spine Sleep hygiene: Poor.  Has night terrors secondary to PTSD.  Sleep study did not meet criteria for sleep apnea but she did have pauses in breathing.  She has been started on CPAP   HISTORY: Onset:  Since her 89s Location:  Right sided Quality:  squeezing Initial Intensity:  8.5/10 (sometimes 10/10) Aura:  Sometimes preceded with visual aura (sees a pinwheel) Prodrome:  no Associated symptoms: Nausea, photophobia, phonophobia.  No worse headache of life, change in quality, wakes her up from sleep. Initial Duration:  3 hours to all day Initial Frequency:  daily Triggers:  Apples, aged cheese, menstrual cycle Relieving factors:  None Activity:  Light activity aggravates   Past NSAIDS:  ibuprofen  Past analgesics:  Excedrin Past abortive triptans:  Sumatriptan  tablet/NS/[place], rizatriptan  10mg  Past muscle relaxants:  no Past anti-emetic:  no Past antihypertensive medications:  no Past antidepressant medications:  amitriptyline 50mg  Past anticonvulsant  medications:  no Past vitamins/Herbal/Supplements:  no Past antihistamines/decongestants:  no Other past therapies:  no   Family history of headache:  Son has migraines.    CT of head from 01/16/15 was unremarkable.   In September 2018, she developed a thunderclap headache.  Onset was over a month ago.  It lasts for a couple of seconds and occurs once a week.  She has not had any vision loss, unilateral numbness or weakness or vertigo.  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  Repeat CTA of head on 02/25/2019 and 06/09/2020 were stable.  CTA head and neck on 05/11/2021 showed stable appearance of tortuous proximal right PCA with focal dilatation possibly reflecting small aneurysm.  MRI of brain showed mild chronic small vessel ischemic changes but no acute abnormality.    She reports onset of dizziness In January 2024.  She has had this before but not lasting this long.  She describes it as positional.  Spinning sensation.  Usually occurs when shifts head back or when getting up.  Also may happen while walking.  Lasts for several minutes.  It occurs daily.  She had a hearing test which revealed mild hearing loss in both ears but enough to require hearing aids.  She denies prior history of vertigo.  She has cervical spondylosis with spinal stenosis as demonstrated on MRI C-spine from 07/17/2021, followed by orthopedics.  Surgery was recommended but she needs to lose weight.    PAST MEDICAL HISTORY: Past Medical History:  Diagnosis Date   ADHD (attention deficit hyperactivity disorder)    Arthritis    Asthma    Cancer (HCC)    Thyroid    Chicken pox AGE 41   COPD (chronic obstructive pulmonary disease) (HCC)    pt reported   Diabetes mellitus without complication (HCC)    Diverticulosis, sigmoid    GERD (gastroesophageal reflux disease)    Glaucoma    BOTH EYES, NO EYE DROPS   Glaucoma    History of blood transfusion 08/2014    2UNITS GIVEN AND IRON GIVEN   Hyperglycemia 09/09/2014   Hypertension    Incomplete spinal cord  lesion at T7-T12 level without bone injury (HCC) AGE 66   HAD TO LEARN TO WALK AGAIN   Iron deficiency anemia due to chronic blood loss 09/09/2014   Low TSH level 09/08/2014   Lumbar herniated disc    Menorrhagia 09/08/2014   Migraine    CLUSTER AND MIGRAINES   Multiple thyroid  nodules    Ovarian mass, right 09/09/2014   PE (pulmonary embolism) 2016   Peripheral neuropathy    FINGER TIPS AND TOES NUMB SOME   Preeclampsia 1983   WITH PREGNANCY   PTSD (post-traumatic stress disorder)    Scoliosis    Seizures (HCC) AGE 41   NONE SINCE, HAD CHICKEN POX THEN    MEDICATIONS: Current Outpatient Medications on File Prior to Visit  Medication Sig Dispense Refill   albuterol  (PROVENTIL ) (2.5 MG/3ML) 0.083% nebulizer solution Take 3 mLs (2.5 mg total) by nebulization every 6 (six) hours as needed for wheezing or shortness of breath. 3 mL 3   albuterol  (VENTOLIN  HFA) 108 (90 Base) MCG/ACT inhaler Inhale 2 puffs into the lungs every 6 (six) hours as needed for wheezing or shortness of breath. 8 g 2   apixaban  (ELIQUIS ) 5 MG TABS tablet TAKE ONE TABLET BY MOUTH TWICE DAILY MORNING  AND EVENING 60 tablet 0   atorvastatin  (LIPITOR) 20 MG tablet TAKE ONE TABLET BY MOUTH ONCE DAILY AT BEDTIME 30 tablet 0   budesonide -formoterol  (SYMBICORT ) 160-4.5 MCG/ACT inhaler Inhale 2 puffs into the lungs 2 (two) times daily. (Patient taking differently: Inhale 2 puffs into the lungs 2 (two) times daily as needed (for shortness of breath).) 2 each 6   buPROPion (WELLBUTRIN XL) 300 MG 24 hr tablet Take 300 mg by mouth daily.   2   busPIRone  (BUSPAR ) 10 MG tablet Take 10 mg by mouth 2 (two) times daily.     Cholecalciferol (VITAMIN D3) 10 MCG (400 UNIT) CAPS TAKE ONE CAPSULE BY MOUTH ONCE DAILY (AM) 100 capsule 0   doxepin (SINEQUAN) 50 MG capsule Take 150 mg by mouth at bedtime.     Erenumab -aooe (AIMOVIG ) 140 MG/ML SOAJ Inject 140 mg into the skin every 28 (twenty-eight) days. 1.12 mL 5   gabapentin  (NEURONTIN ) 300  MG capsule TAKE ONE CAPSULE BY MOUTH THREE TIMES DAILY (AM+NOON+BEDTIME) 90 capsule 6   ketoconazole  (NIZORAL ) 2 % cream Apply 1 Application topically daily. Apply to affected areas on face twice a day for 3 weeks after using nystatin -triamcinolone  for 1 week. (Patient taking differently: Apply 1 Application topically daily as needed for irritation. Apply to affected areas on face twice a day for 3 weeks after using nystatin -triamcinolone  for 1 week.) 30 g 0   levothyroxine  (SYNTHROID ) 137 MCG tablet Take 274 mcg by mouth daily before breakfast.     loratadine  (CLARITIN ) 10 MG tablet TAKE 1 TABLET (10 MG TOTAL) BY MOUTH DAILY (AM) 30 tablet 0   metoprolol  tartrate (LOPRESSOR ) 50 MG tablet TAKE ONE TABLET BY MOUTH TWICE DAILY (AM+BEDTIME) (Patient taking differently: Take 75 mg by mouth 2 (two) times daily. TAKE ONE TABLET BY MOUTH TWICE DAILY (AM+BEDTIME)) 60 tablet 0   montelukast  (SINGULAIR ) 10 MG tablet TAKE 1 TABLET BY MOUTH EVERY EVENING AT BEDTIME 30 tablet 0   oxybutynin (DITROPAN-XL) 5 MG 24 hr tablet Take 5 mg by mouth at bedtime.     prazosin (MINIPRESS) 5 MG capsule Take 5 mg by mouth at bedtime.     Semaglutide, 1 MG/DOSE, 2 MG/1.5ML SOPN Inject 2 mg into the skin once a week.     topiramate  (TOPAMAX ) 200 MG tablet TAKE 1 TABLET (200 MG TOTAL) BY MOUTH AT BEDTIME. 90 tablet 0   traZODone (DESYREL) 50 MG tablet Take 75 mg by mouth at bedtime. (Patient not taking: Reported on 10/24/2023)     VRAYLAR 3 MG capsule Take 3 mg by mouth daily.     No current facility-administered medications on file prior to visit.    ALLERGIES: Allergies  Allergen Reactions   Ceftriaxone Anaphylaxis and Other (See Comments)    ROCEPHIN   Metformin  Anaphylaxis    ALL   Penicillin G Shortness Of Breath   Penicillins Shortness Of Breath    Has patient had a PCN reaction causing immediate rash, facial/tongue/throat swelling, SOB or lightheadedness with hypotension: Yes Has patient had a PCN reaction causing  severe rash involving mucus membranes or skin necrosis: No Has patient had a PCN reaction that required hospitalization No Has patient had a PCN reaction occurring within the last 10 years: No If all of the above answers are NO, then may proceed with Cephalosporin use.    Shellfish Allergy Anaphylaxis   Shellfish-Derived Products Anaphylaxis   Citrus Rash   Flonase  [Fluticasone  Propionate]     Makes migraines worse   Gold-Containing  Drug Products     HANDS ITCH   Nickel     HANDS SWELL    FAMILY HISTORY: Family History  Problem Relation Age of Onset   Diabetes Mother    Breast cancer Mother    CAD Mother    Hypertension Mother    Alcohol abuse Father    Hypertension Father    Breast cancer Maternal Aunt    Breast cancer Maternal Aunt    Kidney nephrosis Son    Diabetes Son    Breast cancer Other 60 - 69      Objective:  Height 5' 9 (1.753 m), weight 300 lb (136.1 kg). General: No acute distress.  Patient appears well-groomed.   Head:  Normocephalic/atraumatic Eyes:  Fundi examined but not visualized Neck: supple, bilateral paraspinal tenderness, full range of motion Heart:  Regular rate and rhythm Neurological Exam: alert and oriented.  Speech fluent and not dysarthric, language intact.  CN II-XII intact. Bulk and tone normal, muscle strength 5/5 throughout.  Sensation to light touch intact.  Deep tendon reflexes 2+ throughout, toes downgoing.  Finger to nose testing intact.  Gait wide based, Romberg negative.    Melinda Dunnings, DO  CC: [place]  [place], [place]

## 2024-01-06 ENCOUNTER — Ambulatory Visit: Payer: Medicaid Other | Admitting: Neurology

## 2024-01-06 ENCOUNTER — Encounter: Payer: Self-pay | Admitting: Neurology

## 2024-01-06 VITALS — Ht 69.0 in | Wt 300.0 lb

## 2024-01-06 DIAGNOSIS — R269 Unspecified abnormalities of gait and mobility: Secondary | ICD-10-CM | POA: Diagnosis not present

## 2024-01-06 DIAGNOSIS — G43009 Migraine without aura, not intractable, without status migrainosus: Secondary | ICD-10-CM | POA: Diagnosis not present

## 2024-01-06 DIAGNOSIS — I671 Cerebral aneurysm, nonruptured: Secondary | ICD-10-CM | POA: Diagnosis not present

## 2024-01-06 MED ORDER — AIMOVIG 140 MG/ML ~~LOC~~ SOAJ: 140.0000 mg | SUBCUTANEOUS | 11 refills | Status: DC

## 2024-01-06 NOTE — Patient Instructions (Addendum)
 Restart Aimovig  Continue topiramate  200mg  at bedtime.  Have labs tomorrow faxed to me Limit use of pain relievers to no more than 9 days out of the month to prevent risk of rebound or medication-overuse headache. Keep headache diary Plan to repeat imaging of aneurysm in 3 years Follow up one year

## 2024-01-07 DIAGNOSIS — R269 Unspecified abnormalities of gait and mobility: Secondary | ICD-10-CM | POA: Diagnosis not present

## 2024-01-08 DIAGNOSIS — R269 Unspecified abnormalities of gait and mobility: Secondary | ICD-10-CM | POA: Diagnosis not present

## 2024-01-09 DIAGNOSIS — R269 Unspecified abnormalities of gait and mobility: Secondary | ICD-10-CM | POA: Diagnosis not present

## 2024-01-12 DIAGNOSIS — E890 Postprocedural hypothyroidism: Secondary | ICD-10-CM | POA: Diagnosis not present

## 2024-01-12 DIAGNOSIS — R269 Unspecified abnormalities of gait and mobility: Secondary | ICD-10-CM | POA: Diagnosis not present

## 2024-01-13 DIAGNOSIS — R269 Unspecified abnormalities of gait and mobility: Secondary | ICD-10-CM | POA: Diagnosis not present

## 2024-01-14 DIAGNOSIS — R269 Unspecified abnormalities of gait and mobility: Secondary | ICD-10-CM | POA: Diagnosis not present

## 2024-01-15 DIAGNOSIS — F411 Generalized anxiety disorder: Secondary | ICD-10-CM | POA: Diagnosis not present

## 2024-01-15 DIAGNOSIS — R269 Unspecified abnormalities of gait and mobility: Secondary | ICD-10-CM | POA: Diagnosis not present

## 2024-01-22 DIAGNOSIS — F411 Generalized anxiety disorder: Secondary | ICD-10-CM | POA: Diagnosis not present

## 2024-01-28 DIAGNOSIS — H05243 Constant exophthalmos, bilateral: Secondary | ICD-10-CM | POA: Diagnosis not present

## 2024-01-28 DIAGNOSIS — H31092 Other chorioretinal scars, left eye: Secondary | ICD-10-CM | POA: Diagnosis not present

## 2024-01-28 DIAGNOSIS — H16143 Punctate keratitis, bilateral: Secondary | ICD-10-CM | POA: Diagnosis not present

## 2024-01-28 DIAGNOSIS — H40013 Open angle with borderline findings, low risk, bilateral: Secondary | ICD-10-CM | POA: Diagnosis not present

## 2024-01-28 DIAGNOSIS — H04123 Dry eye syndrome of bilateral lacrimal glands: Secondary | ICD-10-CM | POA: Diagnosis not present

## 2024-01-28 DIAGNOSIS — E119 Type 2 diabetes mellitus without complications: Secondary | ICD-10-CM | POA: Diagnosis not present

## 2024-01-28 DIAGNOSIS — H2513 Age-related nuclear cataract, bilateral: Secondary | ICD-10-CM | POA: Diagnosis not present

## 2024-01-29 DIAGNOSIS — F411 Generalized anxiety disorder: Secondary | ICD-10-CM | POA: Diagnosis not present

## 2024-01-30 ENCOUNTER — Emergency Department (HOSPITAL_COMMUNITY)

## 2024-01-30 ENCOUNTER — Observation Stay (HOSPITAL_COMMUNITY)
Admission: EM | Admit: 2024-01-30 | Discharge: 2024-02-01 | Disposition: A | Discharge status: Home / Self Care | Attending: Internal Medicine | Admitting: Internal Medicine

## 2024-01-30 ENCOUNTER — Encounter (HOSPITAL_COMMUNITY): Payer: Self-pay

## 2024-01-30 ENCOUNTER — Other Ambulatory Visit: Payer: Self-pay

## 2024-01-30 DIAGNOSIS — E890 Postprocedural hypothyroidism: Secondary | ICD-10-CM | POA: Diagnosis not present

## 2024-01-30 DIAGNOSIS — J4489 Other specified chronic obstructive pulmonary disease: Secondary | ICD-10-CM | POA: Insufficient documentation

## 2024-01-30 DIAGNOSIS — S3992XA Unspecified injury of lower back, initial encounter: Secondary | ICD-10-CM | POA: Diagnosis not present

## 2024-01-30 DIAGNOSIS — W19XXXA Unspecified fall, initial encounter: Secondary | ICD-10-CM | POA: Diagnosis not present

## 2024-01-30 DIAGNOSIS — Z7901 Long term (current) use of anticoagulants: Secondary | ICD-10-CM | POA: Insufficient documentation

## 2024-01-30 DIAGNOSIS — R29898 Other symptoms and signs involving the musculoskeletal system: Secondary | ICD-10-CM | POA: Diagnosis not present

## 2024-01-30 DIAGNOSIS — R531 Weakness: Secondary | ICD-10-CM

## 2024-01-30 DIAGNOSIS — F32A Depression, unspecified: Secondary | ICD-10-CM | POA: Insufficient documentation

## 2024-01-30 DIAGNOSIS — M4800 Spinal stenosis, site unspecified: Secondary | ICD-10-CM

## 2024-01-30 DIAGNOSIS — Z86718 Personal history of other venous thrombosis and embolism: Secondary | ICD-10-CM | POA: Diagnosis not present

## 2024-01-30 DIAGNOSIS — Z85850 Personal history of malignant neoplasm of thyroid: Secondary | ICD-10-CM | POA: Insufficient documentation

## 2024-01-30 DIAGNOSIS — G43909 Migraine, unspecified, not intractable, without status migrainosus: Secondary | ICD-10-CM | POA: Diagnosis not present

## 2024-01-30 DIAGNOSIS — R4781 Slurred speech: Secondary | ICD-10-CM | POA: Diagnosis not present

## 2024-01-30 DIAGNOSIS — E1165 Type 2 diabetes mellitus with hyperglycemia: Secondary | ICD-10-CM | POA: Diagnosis present

## 2024-01-30 DIAGNOSIS — M549 Dorsalgia, unspecified: Secondary | ICD-10-CM | POA: Insufficient documentation

## 2024-01-30 DIAGNOSIS — R29818 Other symptoms and signs involving the nervous system: Secondary | ICD-10-CM | POA: Diagnosis not present

## 2024-01-30 DIAGNOSIS — Z9089 Acquired absence of other organs: Secondary | ICD-10-CM | POA: Diagnosis not present

## 2024-01-30 DIAGNOSIS — G4733 Obstructive sleep apnea (adult) (pediatric): Secondary | ICD-10-CM | POA: Insufficient documentation

## 2024-01-30 DIAGNOSIS — G8929 Other chronic pain: Secondary | ICD-10-CM | POA: Diagnosis not present

## 2024-01-30 DIAGNOSIS — Z794 Long term (current) use of insulin: Secondary | ICD-10-CM | POA: Insufficient documentation

## 2024-01-30 DIAGNOSIS — E785 Hyperlipidemia, unspecified: Secondary | ICD-10-CM | POA: Insufficient documentation

## 2024-01-30 DIAGNOSIS — M4802 Spinal stenosis, cervical region: Secondary | ICD-10-CM | POA: Diagnosis not present

## 2024-01-30 DIAGNOSIS — J4530 Mild persistent asthma, uncomplicated: Secondary | ICD-10-CM | POA: Diagnosis present

## 2024-01-30 DIAGNOSIS — M5412 Radiculopathy, cervical region: Secondary | ICD-10-CM | POA: Diagnosis not present

## 2024-01-30 DIAGNOSIS — Z8669 Personal history of other diseases of the nervous system and sense organs: Secondary | ICD-10-CM | POA: Diagnosis not present

## 2024-01-30 DIAGNOSIS — I700 Atherosclerosis of aorta: Secondary | ICD-10-CM | POA: Diagnosis not present

## 2024-01-30 DIAGNOSIS — Z87891 Personal history of nicotine dependence: Secondary | ICD-10-CM | POA: Insufficient documentation

## 2024-01-30 DIAGNOSIS — M48061 Spinal stenosis, lumbar region without neurogenic claudication: Secondary | ICD-10-CM | POA: Diagnosis not present

## 2024-01-30 DIAGNOSIS — I771 Stricture of artery: Secondary | ICD-10-CM | POA: Diagnosis not present

## 2024-01-30 DIAGNOSIS — I10 Essential (primary) hypertension: Secondary | ICD-10-CM | POA: Diagnosis present

## 2024-01-30 DIAGNOSIS — M5090 Cervical disc disorder, unspecified, unspecified cervical region: Principal | ICD-10-CM

## 2024-01-30 DIAGNOSIS — E118 Type 2 diabetes mellitus with unspecified complications: Secondary | ICD-10-CM

## 2024-01-30 DIAGNOSIS — R202 Paresthesia of skin: Secondary | ICD-10-CM | POA: Diagnosis not present

## 2024-01-30 DIAGNOSIS — R269 Unspecified abnormalities of gait and mobility: Secondary | ICD-10-CM | POA: Diagnosis not present

## 2024-01-30 DIAGNOSIS — Z86711 Personal history of pulmonary embolism: Secondary | ICD-10-CM | POA: Diagnosis present

## 2024-01-30 LAB — COMPREHENSIVE METABOLIC PANEL WITH GFR
ALT: 47 U/L — ABNORMAL HIGH (ref 0–44)
AST: 27 U/L (ref 15–41)
Albumin: 3.3 g/dL — ABNORMAL LOW (ref 3.5–5.0)
Alkaline Phosphatase: 97 U/L (ref 38–126)
Anion gap: 5 (ref 5–15)
BUN: 13 mg/dL (ref 6–20)
CO2: 21 mmol/L — ABNORMAL LOW (ref 22–32)
Calcium: 8.7 mg/dL — ABNORMAL LOW (ref 8.9–10.3)
Chloride: 115 mmol/L — ABNORMAL HIGH (ref 98–111)
Creatinine, Ser: 1.17 mg/dL — ABNORMAL HIGH (ref 0.44–1.00)
GFR, Estimated: 55 mL/min — ABNORMAL LOW (ref >60)
Glucose, Bld: 161 mg/dL — ABNORMAL HIGH (ref 70–99)
Potassium: 3.8 mmol/L (ref 3.5–5.1)
Sodium: 141 mmol/L (ref 135–145)
Total Bilirubin: 0.4 mg/dL (ref 0.0–1.2)
Total Protein: 6.3 g/dL — ABNORMAL LOW (ref 6.5–8.1)

## 2024-01-30 LAB — I-STAT CHEM 8, ED
BUN: 15 mg/dL (ref 6–20)
Calcium, Ion: 1.13 mmol/L — ABNORMAL LOW (ref 1.15–1.40)
Chloride: 113 mmol/L — ABNORMAL HIGH (ref 98–111)
Creatinine, Ser: 1.1 mg/dL — ABNORMAL HIGH (ref 0.44–1.00)
Glucose, Bld: 163 mg/dL — ABNORMAL HIGH (ref 70–99)
HCT: 39 % (ref 36.0–46.0)
Hemoglobin: 13.3 g/dL (ref 12.0–15.0)
Potassium: 3.7 mmol/L (ref 3.5–5.1)
Sodium: 146 mmol/L — ABNORMAL HIGH (ref 135–145)
TCO2: 19 mmol/L — ABNORMAL LOW (ref 22–32)

## 2024-01-30 LAB — DIFFERENTIAL
Abs Immature Granulocytes: 0.05 K/uL (ref 0.00–0.07)
Basophils Absolute: 0 K/uL (ref 0.0–0.1)
Basophils Relative: 1 %
Eosinophils Absolute: 0.1 K/uL (ref 0.0–0.5)
Eosinophils Relative: 1 %
Immature Granulocytes: 1 %
Lymphocytes Relative: 19 %
Lymphs Abs: 1.6 K/uL (ref 0.7–4.0)
Monocytes Absolute: 0.5 K/uL (ref 0.1–1.0)
Monocytes Relative: 6 %
Neutro Abs: 6.5 K/uL (ref 1.7–7.7)
Neutrophils Relative %: 72 %

## 2024-01-30 LAB — CBC
HCT: 41.8 % (ref 36.0–46.0)
Hemoglobin: 13.4 g/dL (ref 12.0–15.0)
MCH: 29.8 pg (ref 26.0–34.0)
MCHC: 32.1 g/dL (ref 30.0–36.0)
MCV: 93.1 fL (ref 80.0–100.0)
Platelets: 227 K/uL (ref 150–400)
RBC: 4.49 MIL/uL (ref 3.87–5.11)
RDW: 13.7 % (ref 11.5–15.5)
WBC: 8.8 K/uL (ref 4.0–10.5)
nRBC: 0 % (ref 0.0–0.2)

## 2024-01-30 LAB — APTT: aPTT: 32 s (ref 24–36)

## 2024-01-30 LAB — ETHANOL: Alcohol, Ethyl (B): <15 mg/dL (ref <15)

## 2024-01-30 LAB — PROTIME-INR
INR: 1.3 — ABNORMAL HIGH (ref 0.8–1.2)
Prothrombin Time: 16.9 s — ABNORMAL HIGH (ref 11.4–15.2)

## 2024-01-30 LAB — CBG MONITORING, ED: Glucose-Capillary: 154 mg/dL — ABNORMAL HIGH (ref 70–99)

## 2024-01-30 MED ORDER — IOHEXOL 350 MG/ML SOLN
75.0000 mL | Freq: Once | INTRAVENOUS | Status: AC | PRN
  Administered 2024-01-30: 75 mL via INTRAVENOUS

## 2024-01-30 MED ORDER — SODIUM CHLORIDE 0.9% FLUSH
3.0000 mL | Freq: Once | INTRAVENOUS | Status: AC
  Administered 2024-01-30: 3 mL via INTRAVENOUS

## 2024-01-30 MED ORDER — DIAZEPAM 5 MG/ML IJ SOLN
5.0000 mg | Freq: Once | INTRAMUSCULAR | Status: AC
  Administered 2024-01-30: 5 mg via INTRAVENOUS
  Filled 2024-01-30: qty 2

## 2024-01-30 NOTE — ED Triage Notes (Signed)
 Pt arrives to ED via EMS from home after falling while getting up after her nap. She reports she became weak and her legs gave out, Ems initiated code stroke due to weakness. LKW was 0900 pt currently on Eliquis .

## 2024-01-30 NOTE — ED Provider Notes (Signed)
 [place] [facility]ENC[facility] DE[facility]MENT AT [facility] Provider Note   CSN: [id number] Arrival date & time: 01/30/24  1545  An emergency department physician performed an initial assessment on this suspected stroke patient at 43.  Patient presents with: Code Stroke   Melinda [name] is a 58 y.o. female hx of BPPV, migraines, cerebral aneurysm, DVT on eliquis , here with possible stroke. Patient walks with a walker at baseline. Patient came in as code stroke for slurred speech and left sided weakness. Patient took a nap at 9 am and got up at 2 pm. She then had a fall and unable to walk. No history of stroke in the past.    The history is provided by the patient.       Prior to Admission medications   Medication Sig Start Date End Date Taking? Authorizing Provider  albuterol  (PROVENTIL ) (2.5 MG/3ML) 0.083% nebulizer solution Take 3 mLs (2.5 mg total) by nebulization every 6 (six) hours as needed for wheezing or shortness of breath. 12/19/20   Vicci Barnie NOVAK, MD  albuterol  (VENTOLIN  HFA) [phone] Base) MCG/ACT inhaler Inhale 2 puffs into the lungs every 6 (six) hours as needed for wheezing or shortness of breath. 12/19/20   Vicci Barnie NOVAK, MD  apixaban  (ELIQUIS ) 5 MG TABS tablet TAKE ONE TABLET BY MOUTH TWICE DAILY MORNING AND EVENING 08/26/22   Newlin, Enobong, MD  atorvastatin  (LIPITOR) 20 MG tablet TAKE ONE TABLET BY MOUTH ONCE DAILY AT BEDTIME 05/30/22   Newlin, Enobong, MD  budesonide -formoterol  (SYMBICORT ) 160-4.5 MCG/ACT inhaler Inhale 2 puffs into the lungs 2 (two) times daily. 08/21/21   Vicci Barnie NOVAK, MD  buPROPion (WELLBUTRIN XL) 300 MG 24 hr tablet Take 300 mg by mouth daily.  Patient taking differently: Take 450 mg by mouth daily. 09/12/17   [provider]  busPIRone  (BUSPAR ) 10 MG tablet Take 10 mg by mouth 2 (two) times daily.    [provider]  Cholecalciferol (VITAMIN D3) 10 MCG (400 UNIT) CAPS TAKE ONE CAPSULE BY MOUTH ONCE DAILY (AM) 08/26/22    Newlin, Enobong, MD  doxepin (SINEQUAN) 50 MG capsule Take 150 mg by mouth at bedtime.    [provider]  Erenumab -aooe (AIMOVIG ) 140 MG/ML SOAJ Inject 140 mg into the skin every 28 (twenty-eight) days. 01/06/24   Skeet, Adam R, DO  gabapentin  (NEURONTIN ) 300 MG capsule TAKE ONE CAPSULE BY MOUTH THREE TIMES DAILY (AM+NOON+BEDTIME) Patient taking differently: Take 300 mg by mouth 3 (three) times daily. TAKE ONE CAPSULE BY MOUTH THREE TIMES DAILY (AM+NOON+BEDTIME) 04/04/22   Vicci Barnie NOVAK, MD  ketoconazole  (NIZORAL ) 2 % cream Apply 1 Application topically daily. Apply to affected areas on face twice a day for 3 weeks after using nystatin -triamcinolone  for 1 week. 05/01/23   Alm Delon SAILOR, DO  levothyroxine  (SYNTHROID ) 137 MCG tablet Take 274 mcg by mouth daily before breakfast.    [provider]  loratadine  (CLARITIN ) 10 MG tablet TAKE 1 TABLET (10 MG TOTAL) BY MOUTH DAILY (AM) 08/26/22   Newlin, Enobong, MD  metoprolol  tartrate (LOPRESSOR ) 50 MG tablet TAKE ONE TABLET BY MOUTH TWICE DAILY (AM+BEDTIME) Patient taking differently: Take 75 mg by mouth 2 (two) times daily. TAKE ONE TABLET BY MOUTH TWICE DAILY (AM+BEDTIME) 08/26/22   Newlin, Enobong, MD  montelukast  (SINGULAIR ) 10 MG tablet TAKE 1 TABLET BY MOUTH EVERY EVENING AT BEDTIME 08/26/22   Newlin, Enobong, MD  oxybutynin (DITROPAN-XL) 5 MG 24 hr tablet Take 5 mg by mouth at bedtime.  [provider]  prazosin (MINIPRESS) 5 MG capsule Take 5 mg by mouth at bedtime.    [provider]  Semaglutide, 1 MG/DOSE, 2 MG/1.5ML SOPN Inject 2 mg into the skin once a week. 02/21/22   [provider]  topiramate  (TOPAMAX ) 200 MG tablet TAKE 1 TABLET (200 MG TOTAL) BY MOUTH AT BEDTIME. 12/24/23   Jaffe, Adam R, DO  VRAYLAR 3 MG capsule Take 3 mg by mouth daily. 02/07/23   [provider]    Allergies: Ceftriaxone, Metformin , Penicillin g, Penicillins, Shellfish allergy, Shellfish-derived products,  Citrus, Flonase  [fluticasone  propionate], Gold-containing drug products, and Nickel    Review of Systems  Musculoskeletal:  Positive for back pain.  All other systems reviewed and are negative.   Updated Vital Signs BP 111/84 (BP Location: Right Arm)   Pulse 88   Temp 97.8 F (36.6 C) (Oral)   Resp 16   SpO2 100%   Physical Exam Vitals and nursing note reviewed.  Constitutional:      Comments: Chronically ill  HENT:     Head: Normocephalic.     Right Ear: Tympanic membrane normal.     Left Ear: Tympanic membrane normal.     Nose: Nose normal.     Mouth/Throat:     Mouth: Mucous membranes are moist.  Eyes:     Extraocular Movements: Extraocular movements intact.     Pupils: Pupils are equal, round, and reactive to light.  Cardiovascular:     Rate and Rhythm: Normal rate and regular rhythm.     Pulses: Normal pulses.     Heart sounds: Normal heart sounds.  Pulmonary:     Effort: Pulmonary effort is normal.     Breath sounds: Normal breath sounds.  Abdominal:     General: Abdomen is flat.     Palpations: Abdomen is soft.  Musculoskeletal:        General: Normal range of motion.     Cervical back: Normal range of motion and neck supple.  Skin:    General: Skin is warm.     Capillary Refill: Capillary refill takes less than 2 seconds.  Neurological:     Mental Status: She is alert.     Comments: Strength is 3/5 bilateral legs. No obvious facial droop. Strength 5/5 bilateral arms   Psychiatric:        Mood and Affect: Mood normal.        Behavior: Behavior normal.     (all labs ordered are listed, but only abnormal results are displayed) Labs Reviewed  PROTIME-INR - Abnormal; Notable for the following components:      Result Value   Prothrombin Time 16.9 (*)    INR 1.3 (*)    All other components within normal limits  COMPREHENSIVE METABOLIC PANEL WITH GFR - Abnormal; Notable for the following components:   Chloride 115 (*)    CO2 21 (*)    Glucose, Bld 161  (*)    Creatinine, Ser 1.17 (*)    Calcium  8.7 (*)    Total Protein 6.3 (*)    Albumin 3.3 (*)    ALT 47 (*)    GFR, Estimated 55 (*)    All other components within normal limits  CBG MONITORING, ED - Abnormal; Notable for the following components:   Glucose-Capillary 154 (*)    All other components within normal limits  I-STAT CHEM 8, ED - Abnormal; Notable for the following components:   Sodium 146 (*)    Chloride  113 (*)    Creatinine, Ser 1.10 (*)    Glucose, Bld 163 (*)    Calcium , Ion 1.13 (*)    TCO2 19 (*)    All other components within normal limits  APTT  CBC  DIFFERENTIAL  ETHANOL  I-STAT CHEM 8, ED  CBG MONITORING, ED    EKG: None  Radiology: CT HEAD CODE STROKE WO CONTRAST Result Date: 01/30/2024 CLINICAL DATA:  Code stroke. Neuro deficit, acute, stroke suspected. EXAM: CT HEAD WITHOUT CONTRAST TECHNIQUE: Contiguous axial images were obtained from the base of the skull through the vertex without intravenous contrast. RADIATION DOSE REDUCTION: This exam was performed according to the departmental dose-optimization program which includes automated exposure control, adjustment of the mA and/or kV according to patient size and/or use of iterative reconstruction technique. COMPARISON:  Head CT 10/11/2022. FINDINGS: Brain: No age-advanced or lobar predominant cerebral atrophy. There is no acute intracranial hemorrhage. No demarcated cortical infarct. No extra-axial fluid collection. No evidence of an intracranial mass. No midline shift. Vascular: No hyperdense vessel.  Atherosclerotic calcifications. Skull: No calvarial fracture or aggressive osseous lesion. Sinuses/Orbits: No mass or acute finding within the imaged orbits. No significant paranasal sinus disease at the imaged levels. ASPECTS [facility] Stroke Program Early CT Score) - Ganglionic level infarction (caudate, lentiform nuclei, internal capsule, insula, M1-M3 cortex): 7 - Supraganglionic infarction (M4-M6 cortex): 3  Total score (0-10 with 10 being normal): 10 No evidence of an acute intracranial abnormality. These results were communicated to NP Rocky Likes, NP at 4:13 pmon 8/1/2025by text page via the [facility] messaging system. IMPRESSION: No evidence of an acute intracranial abnormality. Electronically Signed   By: Rockey Childs D.O.   On: 01/30/2024 16:13     Procedures   Medications Ordered in the ED  sodium chloride  flush (NS) 0.9 % injection 3 mL (has no administration in time range)  diazepam  (VALIUM ) injection 5 mg (has no administration in time range)  iohexol  (OMNIPAQUE ) 350 MG/ML injection 75 mL (75 mLs Intravenous Contrast Given 01/30/24 1610)                                    Medical Decision Making TAMIKA SHROPSHIRE is a 58 y.o. female here presenting with bilateral leg weakness after a fall.  Consider stroke versus spinal fracture causing bilateral leg weakness.  Code stroke activated prior to arrival.  Neurology recommend CTA head and neck if negative will need MRI brain and cervical and thoracic and lumbar MRI  9:11 PM I reviewed patient's labs and independently interpreted imaging studies.  Patient has C 5-6 disc bulging and also disc bulging at T1-2 and T2-3.  Patient also has disc bulging L1-2.  I wonder if she has multilevel disease causing her symptoms.  Since patient is unable to walk, will admit patient.  Discussed with Ms. Johnanna, neurosurgery NP who will see the patient  Problems Addressed: Cervical disc disease: acute illness or injury Spinal stenosis, unspecified spinal region: acute illness or injury  Amount and/or Complexity of Data Reviewed Labs: ordered. Decision-making details documented in ED Course. Radiology: ordered and independent interpretation performed. Decision-making details documented in ED Course.  Risk Prescription drug management. Decision regarding hospitalization.     Final diagnoses:  None    ED Discharge Orders     None          Patt Alm Macho, MD 01/30/24 2114

## 2024-01-30 NOTE — ED Notes (Signed)
 CCMD called and notified

## 2024-01-30 NOTE — H&P (Addendum)
 History and Physical    Melinda [name] FMW:[id number] DOB: May 26, 1966 DOA: 01/30/2024  Patient coming from: Home.  Chief Complaint: Extremity weakness.  HPI: Melinda [name] is a 58 y.o. female with history of diabetes mellitus type 2, sleep apnea, depression, hypertension, history of PE, asthma, hypothyroidism postoperative was brought to the ER after patient had a fall at home after she woke up from sleep and was trying to get out of the bed.  She fell onto her buttocks and hurt.  Following which she had some dysarthria and weakness of the extremities.  Patient was brought in as a code stroke.  ED Course: In the ER patient had CT head and MRI brain which did not show anything acute.  CT angiogram of the head and neck did not show any large vessel obstruction.  On exam patient was having weakness of the both lower extremities and neurologist was consulted requested getting MRI of the spine and admitted for further workup.  Neurosurgery was consulted since MRI was showing moderate to severe canal stenosis at C5-6.  On my exam patient is able to move all extremities but patient states she has some difficulty walking.  Labs show potassium of 3.8 creatinine 1.1 platelets 227.  Review of Systems: As per HPI, rest all negative.   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89   HAD TO LEARN TO WALK AGAIN   Iron deficiency anemia due to chronic blood loss  09/09/2014   Low TSH level 09/08/2014   Lumbar herniated disc    Menorrhagia 09/08/2014   Migraine    CLUSTER AND MIGRAINES   Multiple thyroid  nodules    Ovarian mass, right 09/09/2014   PE (pulmonary embolism) 2016   Peripheral neuropathy    FINGER TIPS AND TOES NUMB SOME   Preeclampsia 1983   WITH PREGNANCY   PTSD (post-traumatic stress disorder)    Scoliosis    Seizures (HCC) AGE 22   NONE SINCE, HAD CHICKEN POX THEN    Past Surgical History:  Procedure Laterality Date   ANGIOGRAM TO LEG  08-13-15   RIGHT   CHOLECYSTECTOMY     IR GENERIC HISTORICAL  04/04/2016   IR RADIOLOGIST EVAL & MGMT 04/04/2016 Norleen Roulette, MD [facility] INTERV RAD   RADIOLOGY WITH ANESTHESIA N/A 07/17/2021   Procedure: MRI LUMBAR SPINE WITHOUT CONTRAST; MRI CERVICAL SPINE WITHOUT CONTRAST WITH ANESTHESIA;  Surgeon: Radiologist, Medication, MD;  Location: [facility] OR;  Service: Radiology;  Laterality: N/A;   THYROIDECTOMY N/A 11/17/2015   Procedure: TOTAL THYROIDECTOMY;  Surgeon: Krystal Spinner, MD;  Location: [facility]S;  Service: General;  Laterality: N/A;   UTERINE ARTERY EMBOLIZATION Bilateral 08/13/2015     reports that she quit smoking about 9 years ago. Her smoking use included cigarettes. She started smoking about 49 years ago. She has a 20 pack-year  smoking history. She has been exposed to tobacco smoke. She has never used smokeless tobacco. She reports that she does not drink alcohol and does not use drugs.  Allergies  Allergen Reactions   Ceftriaxone Anaphylaxis and Other (See Comments)    ROCEPHIN   Metformin  Anaphylaxis    ALL   Penicillin G Shortness Of Breath   Penicillins Shortness Of Breath    Has patient had a PCN reaction causing immediate rash, facial/tongue/throat swelling, SOB or lightheadedness with hypotension: Yes Has patient had a PCN reaction causing severe rash involving mucus membranes or skin necrosis: No Has patient had a PCN reaction that required hospitalization No Has patient had a PCN  reaction occurring within the last 10 years: No If all of the above answers are NO, then may proceed with Cephalosporin use.    Shellfish Allergy Anaphylaxis   Shellfish-Derived Products Anaphylaxis   Citrus Rash   Flonase  [Fluticasone  Propionate]     Makes migraines worse   Gold-Containing Drug Products     HANDS ITCH   Nickel     HANDS SWELL    Family History  Problem Relation Age of Onset   Diabetes Mother    Breast cancer Mother    CAD Mother    Hypertension Mother    Alcohol abuse Father    Hypertension Father    Breast cancer Maternal Aunt    Breast cancer Maternal Aunt    Kidney nephrosis Son    Diabetes Son    Breast cancer Other 70 - 68    Prior to Admission medications   Medication Sig Start Date End Date Taking? Authorizing Provider  albuterol  (VENTOLIN  HFA) 108 (90 Base) MCG/ACT inhaler Inhale 2 puffs into the lungs every 6 (six) hours as needed for wheezing or shortness of breath. 12/19/20  Yes Vicci Barnie NOVAK, MD  apixaban  (ELIQUIS ) 5 MG TABS tablet TAKE ONE TABLET BY MOUTH TWICE DAILY MORNING AND EVENING 08/26/22  Yes Newlin, Enobong, MD  atorvastatin  (LIPITOR) 20 MG tablet TAKE ONE TABLET BY MOUTH ONCE DAILY AT BEDTIME 05/30/22  Yes Newlin, Corrina, MD  budesonide -formoterol  (SYMBICORT ) 160-4.5 MCG/ACT inhaler Inhale 2 puffs into the lungs 2 (two) times daily. 08/21/21  Yes Vicci Barnie NOVAK, MD  buPROPion  (WELLBUTRIN  XL) 300 MG 24 hr tablet Take 300 mg by mouth daily.  Patient taking differently: Take 300 mg by mouth daily. Take along with 150mg  tablet 09/12/17  Yes [provider]  busPIRone  (BUSPAR ) 15 MG tablet Take 15 mg by mouth 2 (two) times daily.   Yes [provider]  Cholecalciferol  (VITAMIN D3) 10 MCG (400 UNIT) CAPS TAKE ONE CAPSULE BY MOUTH ONCE DAILY (AM) 08/26/22  Yes Newlin, Enobong, MD  cyclobenzaprine  (FLEXERIL ) 10 MG tablet Take 10 mg by mouth 3 (three) times daily as needed.   Yes [provider]  doxepin   (SINEQUAN ) 100 MG capsule Take 100 mg by mouth at bedtime.   Yes [provider]  Erenumab -aooe (AIMOVIG ) 140 MG/ML SOAJ Inject 140 mg into the skin every 28 (twenty-eight) days. 01/06/24  Yes Jaffe, Adam R, DO  gabapentin  (NEURONTIN ) 300 MG capsule TAKE ONE CAPSULE BY MOUTH THREE TIMES DAILY (AM+NOON+BEDTIME) Patient taking differently: Take 300 mg by mouth 3 (three) times daily. TAKE ONE CAPSULE BY MOUTH THREE TIMES DAILY (AM+NOON+BEDTIME) 04/04/22  Yes Vicci Barnie NOVAK, MD  ketoconazole  (NIZORAL ) 2 % cream Apply 1 Application topically daily. Apply to affected areas on face twice a day for 3 weeks after using nystatin -triamcinolone  for 1 week.  Patient taking differently: Apply 1 Application topically daily as needed (severe dermatitis). 05/01/23  Yes Alm Delon SAILOR, DO  levothyroxine  (SYNTHROID ) 300 MCG tablet Take 150-300 mcg by mouth See admin instructions. Take 1 tablet by mouth daily for 6 days, then take 1/2 tablet on the 7th day   Yes [provider]  loratadine  (CLARITIN ) 10 MG tablet TAKE 1 TABLET (10 MG TOTAL) BY MOUTH DAILY (AM) 08/26/22  Yes Newlin, Enobong, MD  metoprolol  tartrate (LOPRESSOR ) 50 MG tablet TAKE ONE TABLET BY MOUTH TWICE DAILY (AM+BEDTIME) Patient taking differently: Take 75 mg by mouth 2 (two) times daily. TAKE ONE TABLET BY MOUTH TWICE DAILY (AM+BEDTIME) 08/26/22  Yes Newlin, Enobong, MD  montelukast  (SINGULAIR ) 10 MG tablet TAKE 1 TABLET BY MOUTH EVERY EVENING AT BEDTIME 08/26/22  Yes Newlin, Enobong, MD  oxybutynin  (DITROPAN -XL) 5 MG 24 hr tablet Take 5 mg by mouth at bedtime.   Yes [provider]  prazosin (MINIPRESS) 5 MG capsule Take 5 mg by mouth at bedtime.   Yes [provider]  Semaglutide, 1 MG/DOSE, 2 MG/1.5ML SOPN Inject 2 mg into the skin once a week. 02/21/22  Yes [provider]  topiramate  (TOPAMAX ) 200 MG tablet TAKE 1 TABLET (200 MG TOTAL) BY MOUTH AT BEDTIME. 12/24/23  Yes Jaffe, Adam R, DO  VRAYLAR  3 MG  capsule Take 3 mg by mouth daily. 02/07/23  Yes [provider]  WELLBUTRIN  XL 150 MG 24 hr tablet Take 150 mg by mouth every morning. Take along with 300mg  for a total daily dose of 450mg  01/22/24  Yes [provider]  albuterol  (PROVENTIL ) (2.5 MG/3ML) 0.083% nebulizer solution Take 3 mLs (2.5 mg total) by nebulization every 6 (six) hours as needed for wheezing or shortness of breath. Patient not taking: Reported on 01/30/2024 12/19/20   Vicci Barnie NOVAK, MD    Physical Exam: Constitutional: Moderately built and nourished. Vitals:   01/30/24 1720 01/30/24 2015 01/30/24 2254 01/30/24 2323  BP:  120/83 111/71 111/71  Pulse: 89 81 80 80  Resp:  13 18 18   Temp:   97.8 F (36.6 C) 97.8 F (36.6 C)  TempSrc:   Oral Oral  SpO2: 100% 100% 98%   Weight:    136 kg  Height:    5' 9 (1.753 m)   Eyes: Anicteric no pallor. ENMT: No discharge from the ears eyes nose and mouth. Neck: No mass felt.  No neck rigidity. Respiratory: No rhonchi or crepitations. Cardiovascular: S1-S2 heard. Abdomen: Soft nontender bowel sound present. Musculoskeletal: No edema. Skin: No rash. Neurologic: Alert awake oriented time place and person.  Moves all extremities 5 x 5 but patient states she has difficulty placing weight on the lower extremities due to weakness.  No hyperreflexia. Psychiatric: Appears normal.  Normal affect.   Labs on Admission: I have personally reviewed following labs and imaging studies  CBC: Recent Labs  Lab 01/30/24 1552 01/30/24 1555  WBC  --  8.8  NEUTROABS  --  6.5  HGB 13.3 13.4  HCT 39.0 41.8  MCV  --  93.1  PLT  --  227   Basic Metabolic Panel: Recent Labs  Lab 01/30/24 1552 01/30/24 1555  NA 146* 141  K 3.7 3.8  CL 113* 115*  CO2  --  21*  GLUCOSE 163* 161*  BUN 15 13  CREATININE 1.10* 1.17*  CALCIUM   --  8.7*   GFR: Estimated Creatinine Clearance: 80.7 mL/min (A) (by C-G formula based on SCr of 1.17  mg/dL (H)). Liver Function  Tests: Recent Labs  Lab 01/30/24 1555  AST 27  ALT 47*  ALKPHOS 97  BILITOT 0.4  PROT 6.3*  ALBUMIN 3.3*   No results for input(s): LIPASE, AMYLASE in the last 168 hours. No results for input(s): AMMONIA in the last 168 hours. Coagulation Profile: Recent Labs  Lab 01/30/24 1555  INR 1.3*   Cardiac Enzymes: No results for input(s): CKTOTAL, CKMB, CKMBINDEX, TROPONINI in the last 168 hours. BNP (last 3 results) No results for input(s): PROBNP in the last 8760 hours. HbA1C: No results for input(s): HGBA1C in the last 72 hours. CBG: Recent Labs  Lab 01/30/24 1548  GLUCAP 154*   Lipid Profile: No results for input(s): CHOL, HDL, LDLCALC, TRIG, CHOLHDL, LDLDIRECT in the last 72 hours. Thyroid  Function Tests: No results for input(s): TSH, T4TOTAL, FREET4, T3FREE, THYROIDAB in the last 72 hours. Anemia Panel: No results for input(s): VITAMINB12, FOLATE, FERRITIN, TIBC, IRON, RETICCTPCT in the last 72 hours. Urine analysis:    Component Value Date/Time   COLORURINE AMBER (A) 03/29/2023 0627   APPEARANCEUR CLOUDY (A) 03/29/2023 0627   LABSPEC 1.017 03/29/2023 0627   PHURINE 5.0 03/29/2023 0627   GLUCOSEU NEGATIVE 03/29/2023 0627   HGBUR LARGE (A) 03/29/2023 0627   BILIRUBINUR NEGATIVE 03/29/2023 0627   BILIRUBINUR small (A) 12/24/2021 1052   BILIRUBINUR neg 08/25/2015 1652   KETONESUR NEGATIVE 03/29/2023 0627   PROTEINUR 30 (A) 03/29/2023 0627   UROBILINOGEN 1.0 12/24/2021 1052   UROBILINOGEN 0.2 11/09/2014 1254   NITRITE NEGATIVE 03/29/2023 0627   LEUKOCYTESUR TRACE (A) 03/29/2023 0627   Sepsis Labs: @LABRCNTIP (procalcitonin:4,lacticidven:4) )No results found for this or any previous visit (from the past 240 hours).   Radiological Exams on Admission: MR LUMBAR SPINE WO CONTRAST Result Date: 01/30/2024 EXAM: MRI LUMBAR SPINE 01/30/2024 06:30:41 PM TECHNIQUE: Multiplanar multisequence MRI of the lumbar spine was  performed without the administration of intravenous contrast. COMPARISON: MRI of the lumbar spine dated 07/17/2021. CLINICAL HISTORY: Ataxia, lumbar trauma. Head trauma \\T \ C, T, L spine ataxia. Melinda [name] is a 58 y.o. female arriving to [facility] via [facility] on 01/30/2024 with past medical hx significant of migraine, hypertension and non ruptured aneurysm. On Eliquis  (apixaban ) daily. Code stroke was activated by EMS. FINDINGS: BONES AND ALIGNMENT: Normal alignment. Normal vertebral body heights. Bone marrow signal is unremarkable. SPINAL CORD: The conus terminates normally. SOFT TISSUES: No paraspinal mass. L1-L2: Mild central disc bulging but no significant spinal canal or neural foraminal stenosis. L2-L3: No significant disc herniation. No spinal canal stenosis or neural foraminal narrowing. L3-L4: No significant disc herniation. No spinal canal stenosis or neural foraminal narrowing. L4-L5: Mild diffuse disc bulging with mild central spinal canal stenosis and lateral recess stenosis, but no nerve root impingement. L5-S1: Right paracentral bulging disc osteophyte complex causing mild-to-moderate central spinal canal stenosis. The neural foramina are patent. There is no apparent nerve root impingement. IMPRESSION: 1. Mild central disc bulging at L1-2 without significant spinal canal or neural foraminal stenosis. 2. Mild diffuse disc bulging at L4-5 with mild central spinal canal stenosis and lateral recess stenosis, without nerve root impingement. 3. Right paracentral bulging disc osteophyte complex at L5-S1 causing mild-to-moderate central spinal canal stenosis, without nerve root impingement. Neural foramina are patent. Electronically signed by: evalene coho 01/30/2024 06:51 PM EDT RP Workstation: [id number]   MR THORACIC SPINE WO CONTRAST Result Date: 01/30/2024 EXAM: MRI THORACIC SPINE WITHOUT INTRAVENOUS CONTRAST 01/30/2024 06:30:41 PM TECHNIQUE: Multiplanar multisequence MRI of the thoracic  spine was performed without the administration of intravenous contrast. COMPARISON: None available. CLINICAL HISTORY: Ataxia, thoracic trauma. Head trauma \\T \ C, T, L spine ataxia. Melinda [name] is a 58 y.o. female arriving to [facility] via [facility] on 01/30/2024 with past medical hx significant of migraine, hypertension and non ruptured aneurysm. On Eliquis  (apixaban ) daily. Code stroke was activated by EMS. Uses a cane baseline. FINDINGS: BONES AND ALIGNMENT: Normal alignment. Normal vertebral body heights. There is a hemangioma within the T7 vertebral body. No abnormal enhancement. SPINAL CORD: Normal spinal cord volume. Normal spinal cord signal. SOFT TISSUES: Unremarkable. DEGENERATIVE CHANGES: There is a left posterolateral disc bulge at T6-7 which is causing mild-to-moderate left-sided spinal canal stenosis. The neural foramina are patent. There is minimal disc bulging at T1-2 and T2-3, without significant spinal canal stenosis. IMPRESSION: 1. Left posterolateral disc bulge at T6-7 causing mild-to-moderate left-sided spinal canal stenosis. Neural foramina are patent. 2. Minimal disc bulging at T1-2 and T2-3 without significant spinal canal stenosis. Electronically signed by: evalene coho 01/30/2024 06:48 PM EDT RP Workstation: [id number]   MR Cervical Spine Wo Contrast Result Date: 01/30/2024 EXAM: MRI CERVICAL SPINE WITHOUT CONTRAST 01/30/2024 06:30:41 PM TECHNIQUE: Multiplanar multisequence MRI of the cervical spine was performed without the administration of intravenous contrast. COMPARISON: MRI of the cervical spine dated 07/17/2021. CLINICAL HISTORY: Ataxia, cervical trauma. Head trauma \\T \ C, T, L spine ataxia. Melinda [name] is a 58 y.o. female arriving to [facility] via [facility] on 01/30/2024 with past medical hx significant of migraine, hypertension and non ruptured aneurysm. On Eliquis  (apixaban ) daily. Code stroke was activated by EMS. FINDINGS: BONES AND ALIGNMENT: Straightening of the  normal cervical lordosis. SPINAL CORD: The spinal cord is focally indented at the C5-6 level, but normal in signal intensity. C3-C4: There is a broad-based disc bulge, which is eccentric to the left, causing mild-to-moderate central spinal canal stenosis and mild left neural foraminal stenosis. C4-C5: There is a broad-based disc bulge and there is bilateral uncovertebral joint hypertrophy, causing mild-to-moderate central spinal canal stenosis and moderate left neural foraminal stenosis. C5-C6: There is broad-based disc bulging and bilateral uncovertebral joint-type hypertrophy. The bulging disc osteophyte complex indents the ventral surface of the spinal cord and causes moderate-to-severe central spinal canal stenosis and moderate-to-severe left neural foraminal stenosis. There is also mild-to-moderate right neural foraminal stenosis. C6-C7: There is a broad-based disc bulge causing mild to moderate central spinal canal stenosis and mild bilateral neuroforaminal stenosis. IMPRESSION: 1. Straightening of the normal cervical lordosis. 2. At C3-4, broad-based disc bulge eccentric to the left causing mild-to-moderate central spinal canal stenosis and mild left neural foraminal stenosis. 3. At C4-5, broad-based disc bulge and bilateral uncovertebral joint hypertrophy causing mild-to-moderate central spinal canal stenosis and moderate left neural foraminal stenosis. 4. At C5-6, broad-based disc bulging and bilateral uncovertebral joint-type hypertrophy causing moderate-to-severe central spinal canal stenosis, moderate-to-severe left neural foraminal stenosis, and mild-to-moderate right neural foraminal stenosis. The bulging disc osteophyte complex indents the ventral surface of the spinal cord, which is normal in signal intensity. 5. At C6-7, broad-based disc bulge causing mild to moderate central spinal canal stenosis and mild bilateral neuroforaminal stenosis. Electronically signed by: evalene coho 01/30/2024 06:41  PM EDT RP Workstation: [id number]   MR BRAIN WO CONTRAST Result Date: 01/30/2024 EXAM: MRI BRAIN WITHOUT CONTRAST 01/30/2024 06:30:41 PM TECHNIQUE: Multiplanar multisequence MRI of the head/brain was performed without the administration of intravenous contrast. COMPARISON: CT of the head and CT angiogram of the head dated 01/30/2024.  CLINICAL HISTORY: Head trauma, abnormal mental status (Age 24-64y). Head trauma \\T \ C, T, L spine ataxia. Melinda [name] is a 58 y.o. female arriving to [facility] via [facility] on 01/30/2024 with past medical hx significant of migraine, hypertension and non ruptured aneurysm. On Eliquis  (apixaban ) daily. Code stroke was activated by EMS. FINDINGS: BRAIN AND VENTRICLES: No acute infarct. No intracranial hemorrhage. No mass. No midline shift. No hydrocephalus. The sella is unremarkable. Normal flow voids. Mild cerebral white matter disease. ORBITS: No acute abnormality. SINUSES AND MASTOIDS: Mild mucosal disease within the right maxillary sinus. No acute abnormality. BONES AND SOFT TISSUES: Normal marrow signal. No acute soft tissue abnormality. IMPRESSION: 1. No acute intracranial abnormality. 2. Mild cerebral white matter disease. 3. Mild mucosal disease within the right maxillary sinus. Electronically signed by: evalene coho 01/30/2024 06:37 PM EDT RP Workstation: [id number]   CT ANGIO HEAD NECK W WO CM (CODE STROKE) Result Date: 01/30/2024 CLINICAL DATA:  Neuro deficit, acute, stroke suspected. EXAM: CT ANGIOGRAPHY HEAD AND NECK WITH AND WITHOUT CONTRAST TECHNIQUE: Multidetector CT imaging of the head and neck was performed using the standard protocol during bolus administration of intravenous contrast. Multiplanar CT image reconstructions and MIPs were obtained to evaluate the vascular anatomy. Carotid stenosis measurements (when applicable) are obtained utilizing NASCET criteria, using the distal internal carotid diameter as the denominator. RADIATION DOSE REDUCTION: This  exam was performed according to the departmental dose-optimization program which includes automated exposure control, adjustment of the mA and/or kV according to patient size and/or use of iterative reconstruction technique. CONTRAST:  75mL OMNIPAQUE  IOHEXOL  350 MG/ML SOLN COMPARISON:  CT angiogram head/neck 05/11/2021. FINDINGS: CTA NECK FINDINGS Aortic arch: Standard aortic branching. Atherosclerotic plaque within the aortic arch. No hemodynamically significant innominate or proximal subclavian artery stenosis. Right carotid system: CCA and ICA patent within the neck without stenosis or significant atherosclerotic disease. Partially retropharyngeal course of the cervical ICA. Left carotid system: CCA and ICA patent within the neck without stenosis or significant atherosclerotic disease. Vertebral arteries: Codominant and patent within the neck without stenosis or significant atherosclerotic disease. Skeleton: The patient is edentulous. Cervical spondylosis. No acute fracture or aggressive osseous lesion. Other neck: Prior thyroidectomy. No mass or lymphadenopathy identified within the neck. Upper chest: No acute finding. Review of the MIP images confirms the above findings CTA HEAD FINDINGS Anterior circulation: The intracranial internal carotid arteries are patent. Atherosclerotic plaque within both vessels with no more than mild stenosis. The M1 middle cerebral arteries are patent. No M2 proximal branch occlusion or high-grade proximal stenosis. The anterior cerebral arteries are patent. No intracranial aneurysm is identified. Posterior circulation: The intracranial vertebral arteries are patent. The basilar artery is patent. The posterior cerebral arteries are patent. Tortuosity of the proximal right posterior cerebral artery with small focal dilation as the vessel turns, unchanged form the CTA of 05/11/2021 (for instance as seen on series 10, image 20). Posterior communicating arteries are diminutive or  absent, bilaterally. Venous sinuses: Within the limitations of contrast timing, no convincing thrombus. Anatomic variants: As described. Review of the MIP images confirms the above findings No emergent large vessel occlusion identified. These results were communicated to Rocky Likes, NP at 4:45 pmon 8/1/2025by text page via the [facility] messaging system. IMPRESSION: CTA neck: 1. The common carotid, internal carotid and vertebral arteries are patent within the neck without stenosis or significant atherosclerotic disease. 2. Aortic Atherosclerosis (ICD10-I70.0). CTA head: 1. No proximal intracranial large vessel occlusion or high-grade proximal arterial stenosis identified. 2.  Tortuous proximal right posterior cerebral artery with focal dilation, which could potentially reflect a small aneurysm. This is unchanged from the prior CT of 05/11/2021. Electronically Signed   By: Rockey Childs D.O.   On: 01/30/2024 16:47   CT HEAD CODE STROKE WO CONTRAST Result Date: 01/30/2024 CLINICAL DATA:  Code stroke. Neuro deficit, acute, stroke suspected. EXAM: CT HEAD WITHOUT CONTRAST TECHNIQUE: Contiguous axial images were obtained from the base of the skull through the vertex without intravenous contrast. RADIATION DOSE REDUCTION: This exam was performed according to the departmental dose-optimization program which includes automated exposure control, adjustment of the mA and/or kV according to patient size and/or use of iterative reconstruction technique. COMPARISON:  Head CT 10/11/2022. FINDINGS: Brain: No age-advanced or lobar predominant cerebral atrophy. There is no acute intracranial hemorrhage. No demarcated cortical infarct. No extra-axial fluid collection. No evidence of an intracranial mass. No midline shift. Vascular: No hyperdense vessel.  Atherosclerotic calcifications. Skull: No calvarial fracture or aggressive osseous lesion. Sinuses/Orbits: No mass or acute finding within the imaged orbits. No significant paranasal  sinus disease at the imaged levels. ASPECTS [facility] Stroke Program Early CT Score) - Ganglionic level infarction (caudate, lentiform nuclei, internal capsule, insula, M1-M3 cortex): 7 - Supraganglionic infarction (M4-M6 cortex): 3 Total score (0-10 with 10 being normal): 10 No evidence of an acute intracranial abnormality. These results were communicated to NP Rocky Likes, NP at 4:13 pmon 8/1/2025by text page via the [facility] messaging system. IMPRESSION: No evidence of an acute intracranial abnormality. Electronically Signed   By: Rockey Childs D.O.   On: 01/30/2024 16:13      Assessment/Plan Principal Problem:   Cervical radiculopathy    Bilateral lower extremity weakness -    with C-spine showing moderate to severe canal stenosis at C5-6.  Neurosurgery has been consulted.  Will get case physical therapy consult await further recommendation from neurosurgery. Diabetes mellitus type 2 takes Ozempic at home.  No recent hemoglobin A1c in the chart.  On sliding scale coverage.  Check hemoglobin A1c. Hypertension on metoprolol  and prazosin.  EKG is pending. History of PE on Eliquis .  Patient states he did not miss a dose. 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 Hyperlipidemia on statins. Depression on Vraylar  and Wellbutrin .  BuSpar  for anxiety. OSA on CPAP at bedtime. History of migraine on Topamax  for prophylaxis and Aimovig . Asthma on Breo Singulair . Chronic pain on gabapentin .  Confirmed on PDMP website.  X-ray of the pelvis and EKG pending.  Since patient has bilateral lower extremity weakness may need further management and more than 2 midnight stay.   DVT prophylaxis: Eliquis . Code Status: Full code. Family Communication: Discussed with patient. Disposition Plan: Medical floor. Consults called: Neurology and neurosurgery. Admission status: Observation.

## 2024-01-30 NOTE — Code Documentation (Signed)
 Stroke Response Nurse Documentation Code Documentation  Melinda [name] is a 58 y.o. female arriving to [facility]  via [facility] on 01/30/2024 with past medical hx significant of migraine, hypertension and non ruptured aneurysm. On Eliquis  (apixaban ) daily. Code stroke was activated by EMS. Uses a cane baseline.   Patient from home where she was LKW at 0900 when she took a nap. She woke up at 1400 and fell due to weakness in her legs, primarily the left leg. When she tried to get up, she hit her lower back. She was able to crawl to the front door. Confirmed she did take her Eliquis  this morning.   Stroke team at the bedside on patient arrival. Labs drawn and patient cleared for CT by EDP. Patient to CT with team. NIHSS 6, see documentation for details and code stroke times. Patient with bilateral leg weakness on exam. The following imaging was completed:  CT Head and CTA. Patient is not a candidate for IV Thrombolytic due to Eliquis . Patient is not a candidate for IR due to no LVO.   Care Plan: Q2 VS and NIHSS, MRI, swallow screen prior to PO intake.   Bedside handoff with ED RN Duwaine.    Madelin Manila Stroke Response RN

## 2024-01-30 NOTE — ED Notes (Signed)
 MRI called questioning if the patient would need to be medicated, the patient asked to be medicated to be calm enough to stay still MD notified ordered placed for valium .

## 2024-01-30 NOTE — Consult Note (Signed)
 NEUROLOGY CONSULT NOTE   Date of service: January 30, 2024 Patient Name: Melinda [name] MRN:  [id number] DOB:  17-May-1966 Chief Complaint: CODE STROKE Requesting Provider: No att. providers found  History of Present Illness  CARLINE DURA is a 58 y.o. female with hx of DVT/PE on Eliquis  (states that she took her dose this morning) who was BIB EMS as a CODE STROKE due to slurred speech and left sided weakness. EMS states that when they arrived, she was unable to walk and had to crawl to the front door. En route, they stated the weakness seemed to continue, primarily on the left side.  On exam at bridge, she is alert, oriented, able to give clear history, follows all commands, no visual deficit, no aphasia or dysarthria.  Patient states that she took a nap at 0900 and got up at 1400, fell down off the bed and she was getting up and hit her lower back. Bilateral leg weakness is seen on exam.. Patient is unable to lift either leg off the bed. She does have good tone bilaterally and denies changes in sensation.   LKW: 0900 Modified rankin score: 4-Needs assistance to walk and tend to bodily needs IV Thrombolysis: No, low NIH EVT: No, no LVO  NIHSS components Score: Comment  1a Level of Conscious 0[x]  1[]  2[]  3[]      1b LOC Questions 0[x]  1[]  2[]       1c LOC Commands 0[x]  1[]  2[]       2 Best Gaze 0[x]  1[]  2[]       3 Visual 0[x]  1[]  2[]  3[]      4 Facial Palsy 0[x]  1[]  2[]  3[]      5a Motor Arm - left 0[x]  1[]  2[]  3[]  4[]  UN[]    5b Motor Arm - Right 0[x]  1[]  2[]  3[]  4[]  UN[]    6a Motor Leg - Left 0[]  1[]  2[]  3[x]  4[]  UN[]    6b Motor Leg - Right 0[]  1[]  2[]  3[x]  4[]  UN[]    7 Limb Ataxia 0[x]  1[]  2[]  UN[]      8 Sensory 0[]  1[]  2[]  UN[]      9 Best Language 0[x]  1[]  2[]  3[]      10 Dysarthria 0[x]  1[]  2[]  UN[]      11 Extinct. and Inattention 0[x]  1[]  2[]       TOTAL:   6      ROS  Comprehensive ROS performed and pertinent positives documented in HPI   Past History   Past Medical History:   Diagnosis Date   ADHD (attention deficit hyperactivity disorder)    Arthritis    Asthma    Cancer (HCC)    Thyroid    Chicken pox AGE 52   COPD (chronic obstructive pulmonary disease) (HCC)    pt reported   Diabetes mellitus without complication (HCC)    Diverticulosis, sigmoid    GERD (gastroesophageal reflux disease)    Glaucoma    BOTH EYES, NO EYE DROPS   Glaucoma    History of blood transfusion 08/2014    2UNITS GIVEN AND IRON GIVEN   Hyperglycemia 09/09/2014   Hypertension    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3   HAD TO LEARN TO WALK AGAIN   Iron deficiency anemia due to chronic blood loss 09/09/2014   Low TSH level 09/08/2014   Lumbar herniated disc    Menorrhagia 09/08/2014   Migraine    CLUSTER AND MIGRAINES   Multiple thyroid  nodules    Ovarian mass, right 09/09/2014  PE (pulmonary embolism) 2016   Peripheral neuropathy    FINGER TIPS AND TOES NUMB SOME   Preeclampsia 1983   WITH PREGNANCY   PTSD (post-traumatic stress disorder)    Scoliosis    Seizures (HCC) AGE 6   NONE SINCE, HAD CHICKEN POX THEN    Past Surgical History:  Procedure Laterality Date   ANGIOGRAM TO LEG  08-13-15   RIGHT   CHOLECYSTECTOMY     IR GENERIC HISTORICAL  04/04/2016   IR RADIOLOGIST EVAL & MGMT 04/04/2016 Norleen Roulette, MD [facility] INTERV RAD   RADIOLOGY WITH ANESTHESIA N/A 07/17/2021   Procedure: MRI LUMBAR SPINE WITHOUT CONTRAST; MRI CERVICAL SPINE WITHOUT CONTRAST WITH ANESTHESIA;  Surgeon: Radiologist, Medication, MD;  Location: [facility] OR;  Service: Radiology;  Laterality: N/A;   THYROIDECTOMY N/A 11/17/2015   Procedure: TOTAL THYROIDECTOMY;  Surgeon: Krystal Spinner, MD;  Location: [facility] ORS;  Service: General;  Laterality: N/A;   UTERINE ARTERY EMBOLIZATION Bilateral 08/13/2015    Family History: Family History  Problem Relation Age of Onset   Diabetes Mother    Breast cancer Mother    CAD Mother    Hypertension Mother    Alcohol abuse  Father    Hypertension Father    Breast cancer Maternal Aunt    Breast cancer Maternal Aunt    Kidney nephrosis Son    Diabetes Son    Breast cancer Other 19 - 58    Social History  reports that she quit smoking about 9 years ago. Her smoking use included cigarettes. She started smoking about 49 years ago. She has a 20 pack-year smoking history. She has been exposed to tobacco smoke. She has never used smokeless tobacco. She reports that she does not drink alcohol and does not use drugs.  Allergies  Allergen Reactions   Ceftriaxone Anaphylaxis and Other (See Comments)    ROCEPHIN   Metformin  Anaphylaxis    ALL   Penicillin G Shortness Of Breath   Penicillins Shortness Of Breath    Has patient had a PCN reaction causing immediate rash, facial/tongue/throat swelling, SOB or lightheadedness with hypotension: Yes Has patient had a PCN reaction causing severe rash involving mucus membranes or skin necrosis: No Has patient had a PCN reaction that required hospitalization No Has patient had a PCN reaction occurring within the last 10 years: No If all of the above answers are NO, then may proceed with Cephalosporin use.    Shellfish Allergy Anaphylaxis   Shellfish-Derived Products Anaphylaxis   Citrus Rash   Flonase  [Fluticasone  Propionate]     Makes migraines worse   Gold-Containing Drug Products     HANDS ITCH   Nickel     HANDS SWELL    Medications  No current facility-administered medications for this encounter.  Current Outpatient Medications:    albuterol  (PROVENTIL ) (2.5 MG/3ML) 0.083% nebulizer solution, Take 3 mLs (2.5 mg total) by nebulization every 6 (six) hours as needed for wheezing or shortness of breath., Disp: 3 mL, Rfl: 3   albuterol  (VENTOLIN  HFA) 108 (90 Base) MCG/ACT inhaler, Inhale 2 puffs into the lungs every 6 (six) hours as needed for wheezing or shortness of breath., Disp: 8 g, Rfl: 2   apixaban  (ELIQUIS ) 5 MG TABS tablet, TAKE ONE TABLET BY MOUTH TWICE  DAILY MORNING AND EVENING, Disp: 60 tablet, Rfl: 0   atorvastatin  (LIPITOR) 20 MG tablet, TAKE ONE TABLET BY MOUTH ONCE DAILY AT BEDTIME, Disp: 30 tablet, Rfl: 0   budesonide -formoterol  (SYMBICORT ) 160-4.5 MCG/ACT inhaler, Inhale 2  puffs into the lungs 2 (two) times daily., Disp: 2 each, Rfl: 6   buPROPion (WELLBUTRIN XL) 300 MG 24 hr tablet, Take 300 mg by mouth daily.  (Patient taking differently: Take 450 mg by mouth daily.), Disp: , Rfl: 2   busPIRone  (BUSPAR ) 10 MG tablet, Take 10 mg by mouth 2 (two) times daily., Disp: , Rfl:    Cholecalciferol (VITAMIN D3) 10 MCG (400 UNIT) CAPS, TAKE ONE CAPSULE BY MOUTH ONCE DAILY (AM), Disp: 100 capsule, Rfl: 0   doxepin (SINEQUAN) 50 MG capsule, Take 150 mg by mouth at bedtime., Disp: , Rfl:    Erenumab -aooe (AIMOVIG ) 140 MG/ML SOAJ, Inject 140 mg into the skin every 28 (twenty-eight) days., Disp: 1.12 mL, Rfl: 11   gabapentin  (NEURONTIN ) 300 MG capsule, TAKE ONE CAPSULE BY MOUTH THREE TIMES DAILY (AM+NOON+BEDTIME) (Patient taking differently: Take 300 mg by mouth 3 (three) times daily. TAKE ONE CAPSULE BY MOUTH THREE TIMES DAILY (AM+NOON+BEDTIME)), Disp: 90 capsule, Rfl: 6   ketoconazole  (NIZORAL ) 2 % cream, Apply 1 Application topically daily. Apply to affected areas on face twice a day for 3 weeks after using nystatin -triamcinolone  for 1 week., Disp: 30 g, Rfl: 0   levothyroxine  (SYNTHROID ) 137 MCG tablet, Take 274 mcg by mouth daily before breakfast., Disp: , Rfl:    loratadine  (CLARITIN ) 10 MG tablet, TAKE 1 TABLET (10 MG TOTAL) BY MOUTH DAILY (AM), Disp: 30 tablet, Rfl: 0   metoprolol  tartrate (LOPRESSOR ) 50 MG tablet, TAKE ONE TABLET BY MOUTH TWICE DAILY (AM+BEDTIME) (Patient taking differently: Take 75 mg by mouth 2 (two) times daily. TAKE ONE TABLET BY MOUTH TWICE DAILY (AM+BEDTIME)), Disp: 60 tablet, Rfl: 0   montelukast  (SINGULAIR ) 10 MG tablet, TAKE 1 TABLET BY MOUTH EVERY EVENING AT BEDTIME, Disp: 30 tablet, Rfl: 0   oxybutynin (DITROPAN-XL) 5  MG 24 hr tablet, Take 5 mg by mouth at bedtime., Disp: , Rfl:    prazosin (MINIPRESS) 5 MG capsule, Take 5 mg by mouth at bedtime., Disp: , Rfl:    Semaglutide, 1 MG/DOSE, 2 MG/1.5ML SOPN, Inject 2 mg into the skin once a week., Disp: , Rfl:    topiramate  (TOPAMAX ) 200 MG tablet, TAKE 1 TABLET (200 MG TOTAL) BY MOUTH AT BEDTIME., Disp: 90 tablet, Rfl: 0   VRAYLAR 3 MG capsule, Take 3 mg by mouth daily., Disp: , Rfl:   Vitals   There were no vitals filed for this visit.  There is no height or weight on file to calculate BMI.   Physical Exam   Constitutional: Appears well-developed and well-nourished.  Cardiovascular: Normal rate and regular rhythm.  Respiratory: Effort normal, non-labored breathing.   Neurologic Examination   Neuro: Mental Status: Patient is awake, alert, oriented to person, place, month, year, and situation. Patient is able to give a clear and coherent history. No signs of aphasia or neglect Cranial Nerves: II: Visual Fields are full. Pupils are equal, round, and reactive to light.   III,IV, VI: EOMI without ptosis or diploplia.  V: Facial sensation is symmetric to light touch VII: Facial movement is symmetric.  VIII: hearing is intact to voice X: No dysarthria or hoarseness XI: Shoulder shrug is symmetric. XII: tongue is midline without atrophy or fasciculations.  Motor: Tone is normal. Bulk is normal.  BLE: 1/5 hop flexion, 3/5 knee flexion, 4-/5 knee extension, 4-/5 dorsal flexion, 4/5 plantar flexion Sensory: Sensation is symmetric to light touch in the arms and legs. Cerebellar: FNF intact bilaterally. Unable to perform HKS due to BLE  weakness.    Labs/Imaging/Neurodiagnostic studies   CBC:  Recent Labs  Lab 02/06/2024 1552  HGB 13.3  HCT 39.0   Basic Metabolic Panel:  Lab Results  Component Value Date   NA 146 (H) 02-06-24   K 3.7 February 06, 2024   CO2 21 (L) 08/16/2023   GLUCOSE 163 (H) 06-Feb-2024   BUN 15 Feb 06, 2024   CREATININE 1.10 (H)  02-06-24   CALCIUM  8.9 08/16/2023   GFRNONAA 57 (L) 08/16/2023   GFRAA >60 12/30/2019   Lipid Panel:  Lab Results  Component Value Date   LDLCALC 68 04/20/2020   HgbA1c:  Lab Results  Component Value Date   HGBA1C 6.1 04/20/2021   Urine Drug Screen: No results found for: LABOPIA, COCAINSCRNUR, LABBENZ, AMPHETMU, THCU, LABBARB  Alcohol Level     Component Value Date/Time   ETH <10 05/11/2021 1434   INR  Lab Results  Component Value Date   INR 1.2 10/11/2022   APTT  Lab Results  Component Value Date   APTT 29 05/11/2021   AED levels: No results found for: PHENYTOIN, ZONISAMIDE, LAMOTRIGINE, LEVETIRACETA  CT Head without contrast(Personally reviewed): No evidence of an acute intracranial abnormality.   CT angio Head and Neck with contrast(Personally reviewed): No LVO   ASSESSMENT  JALEYAH LONGHI is a 58 y.o. female with hx of DVT/PE on Eliquis  (states that she took her dose this morning) who was BIB EMS as a CODE STROKE.  On exam at bridge, Bilateral leg weakness, unable to lift either leg off the bed.   Stroke is low on the differential as her weakness is bilateral legs with no other neurological deficits noted. She endorses hitting her lower back when she feel earlier. With that trauma and her being on Eliquis , MRI of spine is warranted to evaluate for an epidural hematoma.   RECOMMENDATIONS   - MRI Brain - MRI C/T/L Spine ______________________________________________________________________    Signed, Rocky JAYSON Likes, NP [facility]   NEUROHOSPITALIST ADDENDUM Performed a face to face diagnostic evaluation.   I have reviewed the contents of history and physical exam as documented by PA/ARNP/Resident and agree with above documentation.  I have discussed and formulated the above plan as documented. Edits to the note have been made as needed.  Dolores Ewing, MD [facility] [phone]   If 7pm to 7am,  please call on call as listed on AMION.

## 2024-01-31 ENCOUNTER — Encounter (HOSPITAL_COMMUNITY): Payer: Self-pay | Admitting: Internal Medicine

## 2024-01-31 ENCOUNTER — Observation Stay (HOSPITAL_COMMUNITY)

## 2024-01-31 DIAGNOSIS — W19XXXA Unspecified fall, initial encounter: Secondary | ICD-10-CM | POA: Diagnosis not present

## 2024-01-31 DIAGNOSIS — M4802 Spinal stenosis, cervical region: Secondary | ICD-10-CM | POA: Diagnosis not present

## 2024-01-31 DIAGNOSIS — R29898 Other symptoms and signs involving the musculoskeletal system: Secondary | ICD-10-CM | POA: Diagnosis not present

## 2024-01-31 DIAGNOSIS — M47816 Spondylosis without myelopathy or radiculopathy, lumbar region: Secondary | ICD-10-CM | POA: Diagnosis not present

## 2024-01-31 DIAGNOSIS — S3993XA Unspecified injury of pelvis, initial encounter: Secondary | ICD-10-CM | POA: Diagnosis not present

## 2024-01-31 DIAGNOSIS — M4722 Other spondylosis with radiculopathy, cervical region: Secondary | ICD-10-CM | POA: Diagnosis not present

## 2024-01-31 LAB — GLUCOSE, CAPILLARY
Glucose-Capillary: 90 mg/dL (ref 70–99)
Glucose-Capillary: 129 mg/dL — ABNORMAL HIGH (ref 70–99)
Glucose-Capillary: 126 mg/dL — ABNORMAL HIGH (ref 70–99)
Glucose-Capillary: 136 mg/dL — ABNORMAL HIGH (ref 70–99)

## 2024-01-31 LAB — CBC WITH DIFFERENTIAL/PLATELET
Abs Immature Granulocytes: 0.03 K/uL (ref 0.00–0.07)
Basophils Absolute: 0 K/uL (ref 0.0–0.1)
Basophils Relative: 1 %
Eosinophils Absolute: 0.1 K/uL (ref 0.0–0.5)
Eosinophils Relative: 1 %
HCT: 39.2 % (ref 36.0–46.0)
Hemoglobin: 12.6 g/dL (ref 12.0–15.0)
Immature Granulocytes: 0 %
Lymphocytes Relative: 42 %
Lymphs Abs: 3.4 K/uL (ref 0.7–4.0)
MCH: 29.5 pg (ref 26.0–34.0)
MCHC: 32.1 g/dL (ref 30.0–36.0)
MCV: 91.8 fL (ref 80.0–100.0)
Monocytes Absolute: 0.5 K/uL (ref 0.1–1.0)
Monocytes Relative: 6 %
Neutro Abs: 4 K/uL (ref 1.7–7.7)
Neutrophils Relative %: 50 %
Platelets: 210 K/uL (ref 150–400)
RBC: 4.27 MIL/uL (ref 3.87–5.11)
RDW: 14 % (ref 11.5–15.5)
WBC: 8 K/uL (ref 4.0–10.5)
nRBC: 0 % (ref 0.0–0.2)

## 2024-01-31 LAB — BASIC METABOLIC PANEL WITH GFR
Anion gap: 6 (ref 5–15)
BUN: 12 mg/dL (ref 6–20)
CO2: 23 mmol/L (ref 22–32)
Calcium: 8.6 mg/dL — ABNORMAL LOW (ref 8.9–10.3)
Chloride: 113 mmol/L — ABNORMAL HIGH (ref 98–111)
Creatinine, Ser: 0.92 mg/dL (ref 0.44–1.00)
GFR, Estimated: >60 mL/min
Glucose, Bld: 96 mg/dL (ref 70–99)
Potassium: 3.4 mmol/L — ABNORMAL LOW (ref 3.5–5.1)
Sodium: 142 mmol/L (ref 135–145)

## 2024-01-31 LAB — HIV ANTIBODY (ROUTINE TESTING W REFLEX): HIV Screen 4th Generation wRfx: NONREACTIVE

## 2024-01-31 MED ORDER — OXYBUTYNIN CHLORIDE ER 5 MG PO TB24
5.0000 mg | ORAL_TABLET | Freq: Every day | ORAL | Status: DC
  Administered 2024-01-31: 5 mg via ORAL
  Filled 2024-01-31: qty 1

## 2024-01-31 MED ORDER — BUSPIRONE HCL 10 MG PO TABS
15.0000 mg | ORAL_TABLET | Freq: Two times a day (BID) | ORAL | Status: DC
  Administered 2024-01-31 – 2024-02-01 (×3): 15 mg via ORAL
  Filled 2024-01-31 (×3): qty 2

## 2024-01-31 MED ORDER — APIXABAN 5 MG PO TABS
5.0000 mg | ORAL_TABLET | Freq: Two times a day (BID) | ORAL | Status: DC
  Administered 2024-01-31 – 2024-02-01 (×3): 5 mg via ORAL
  Filled 2024-01-31 (×3): qty 1

## 2024-01-31 MED ORDER — FLUTICASONE FUROATE-VILANTEROL 200-25 MCG/ACT IN AEPB
1.0000 | INHALATION_SPRAY | Freq: Every day | RESPIRATORY_TRACT | Status: DC
  Administered 2024-01-31 – 2024-02-01 (×2): 1 via RESPIRATORY_TRACT
  Filled 2024-01-31: qty 28

## 2024-01-31 MED ORDER — MONTELUKAST SODIUM 10 MG PO TABS
10.0000 mg | ORAL_TABLET | Freq: Every day | ORAL | Status: DC
  Administered 2024-01-31: 10 mg via ORAL
  Filled 2024-01-31: qty 1

## 2024-01-31 MED ORDER — POTASSIUM CHLORIDE 20 MEQ PO PACK
20.0000 meq | PACK | Freq: Once | ORAL | Status: AC
  Administered 2024-01-31: 20 meq via ORAL
  Filled 2024-01-31: qty 1

## 2024-01-31 MED ORDER — METOPROLOL TARTRATE 25 MG PO TABS
75.0000 mg | ORAL_TABLET | Freq: Two times a day (BID) | ORAL | Status: DC
  Administered 2024-01-31 (×2): 75 mg via ORAL
  Filled 2024-01-31 (×2): qty 1

## 2024-01-31 MED ORDER — INSULIN ASPART 100 UNIT/ML IJ SOLN
0.0000 [IU] | Freq: Three times a day (TID) | INTRAMUSCULAR | Status: DC
  Administered 2024-01-31 (×2): 1 [IU] via SUBCUTANEOUS

## 2024-01-31 MED ORDER — TOPIRAMATE 100 MG PO TABS
200.0000 mg | ORAL_TABLET | Freq: Every day | ORAL | Status: DC
  Administered 2024-01-31: 200 mg via ORAL
  Filled 2024-01-31: qty 2

## 2024-01-31 MED ORDER — GABAPENTIN 300 MG PO CAPS
300.0000 mg | ORAL_CAPSULE | Freq: Three times a day (TID) | ORAL | Status: DC
  Administered 2024-01-31 – 2024-02-01 (×4): 300 mg via ORAL
  Filled 2024-01-31 (×4): qty 1

## 2024-01-31 MED ORDER — CYCLOBENZAPRINE HCL 10 MG PO TABS: 10.0000 mg | ORAL_TABLET | Freq: Three times a day (TID) | ORAL | Status: DC | PRN

## 2024-01-31 MED ORDER — LORATADINE 10 MG PO TABS
10.0000 mg | ORAL_TABLET | Freq: Every day | ORAL | Status: DC
  Administered 2024-01-31 – 2024-02-01 (×2): 10 mg via ORAL
  Filled 2024-01-31 (×2): qty 1

## 2024-01-31 MED ORDER — DOXEPIN HCL 25 MG PO CAPS
100.0000 mg | ORAL_CAPSULE | Freq: Every day | ORAL | Status: DC
  Administered 2024-01-31: 100 mg via ORAL
  Filled 2024-01-31: qty 1
  Filled 2024-01-31 (×2): qty 4

## 2024-01-31 MED ORDER — VITAMIN D 25 MCG (1000 UNIT) PO TABS
1000.0000 [IU] | ORAL_TABLET | Freq: Every day | ORAL | Status: DC
  Administered 2024-01-31 – 2024-02-01 (×2): 1000 [IU] via ORAL
  Filled 2024-01-31 (×2): qty 1

## 2024-01-31 MED ORDER — ATORVASTATIN CALCIUM 10 MG PO TABS
20.0000 mg | ORAL_TABLET | Freq: Every day | ORAL | Status: DC
Start: 2024-01-31 — End: 2024-02-01
  Administered 2024-01-31 – 2024-02-01 (×2): 20 mg via ORAL
  Filled 2024-01-31 (×2): qty 2

## 2024-01-31 MED ORDER — BUPROPION HCL ER (XL) 150 MG PO TB24: 150.0000 mg | ORAL_TABLET | Freq: Every morning | ORAL | Status: DC

## 2024-01-31 MED ORDER — LEVOTHYROXINE SODIUM 100 MCG PO TABS
300.0000 ug | ORAL_TABLET | ORAL | Status: DC
  Administered 2024-02-01: 300 ug via ORAL
  Filled 2024-01-31: qty 3

## 2024-01-31 MED ORDER — BUPROPION HCL ER (XL) 150 MG PO TB24
450.0000 mg | ORAL_TABLET | Freq: Every day | ORAL | Status: DC
  Administered 2024-01-31 – 2024-02-01 (×2): 450 mg via ORAL
  Filled 2024-01-31 (×2): qty 3

## 2024-01-31 MED ORDER — ALBUTEROL SULFATE (2.5 MG/3ML) 0.083% IN NEBU: 2.5000 mg | INHALATION_SOLUTION | Freq: Four times a day (QID) | RESPIRATORY_TRACT | Status: DC | PRN

## 2024-01-31 MED ORDER — CARIPRAZINE HCL 1.5 MG PO CAPS
3.0000 mg | ORAL_CAPSULE | Freq: Every day | ORAL | Status: DC
Start: 2024-01-31 — End: 2024-02-01
  Administered 2024-01-31 – 2024-02-01 (×2): 3 mg via ORAL
  Filled 2024-01-31 (×2): qty 2

## 2024-01-31 MED ORDER — LEVOTHYROXINE SODIUM 75 MCG PO TABS
150.0000 ug | ORAL_TABLET | ORAL | Status: DC
  Administered 2024-01-31: 150 ug via ORAL
  Filled 2024-01-31: qty 2

## 2024-01-31 MED ORDER — BUPROPION HCL ER (XL) 150 MG PO TB24: 300.0000 mg | ORAL_TABLET | Freq: Every day | ORAL | Status: DC

## 2024-01-31 NOTE — Consult Note (Addendum)
 HPI:     Patient is a 58 y.o. female presented to ED after a fall at home with c/o leg weakness. She uses walker/wheelchair intermittently at baseline. She states she is feeling improved today, was able to ambulate independently in XR suite w/o significant difficulty.  PMH of diabetes mellitus type 2, sleep apnea, depression, hypertension, history of PE, asthma, hypothyroidism.     Patient Active Problem List   Diagnosis Date Noted   Lower extremity weakness 01/31/2024   Cervical radiculopathy 01/30/2024   Postoperative hypothyroidism 01/21/2022   Esophageal dysphagia 01/21/2022   History of DVT (deep vein thrombosis) 01/21/2022   History of malignant neoplasm of thyroid  01/21/2022   Long term (current) use of anticoagulants 01/21/2022   Migraine without aura, not refractory 01/21/2022   Type 2 diabetes mellitus with hyperglycemia (HCC) 01/21/2022   Spinal stenosis excluding cervical region 11/21/2021   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   Abdominal pannus 08/21/2021   Hardening of the aorta (main artery of the heart) (HCC) 08/21/2021   At high risk for falls 04/20/2020   Hyperlipidemia 02/16/2020   Adrenal mass, right (HCC) 04/29/2019   Chronic cough 03/12/2018   Left shoulder pain 10/14/2016   Primary hypertension 10/14/2016   S/P thyroidectomy 06/27/2016   Chronic bilateral low back pain without sciatica 04/22/2016   Liver lesion 12/24/2015   Malignant tumor of thyroid  gland (HCC) 11/16/2015   Controlled type 2 diabetes mellitus with complication, with long-term current use of insulin  (HCC) 10/10/2015   Vision loss 08/25/2015   Decreased vision in both eyes 08/25/2015   Prolonged Q-T interval on ECG 08/19/2015   History of pulmonary embolism 08/10/2015   Mild persistent asthma 04/13/2015   Nonruptured cerebral aneurysm 04/13/2015   Fibroid, uterine    Morbid obesity (HCC) 11/11/2014   Atypical chest pain 11/08/2014   Sinus tachycardia 11/08/2014   Migraine  10/14/2014   Menorrhagia 09/08/2014   Past Medical History:  Diagnosis Date   ADHD (attention deficit hyperactivity disorder)    Arthritis    Asthma    Cancer (HCC)    Thyroid    Chicken pox AGE 58   COPD (chronic obstructive pulmonary disease) (HCC)    pt reported   Diabetes mellitus without complication (HCC)    Diverticulosis, sigmoid    GERD (gastroesophageal reflux disease)    Glaucoma    BOTH EYES, NO EYE DROPS   Glaucoma    History of blood transfusion 08/2014    2UNITS GIVEN AND IRON GIVEN   Hyperglycemia 09/09/2014   Hypertension    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50   HAD TO LEARN TO WALK AGAIN   Iron deficiency anemia due to chronic blood loss 09/09/2014   Low TSH level 09/08/2014   Lumbar herniated disc    Menorrhagia 09/08/2014   Migraine    CLUSTER AND MIGRAINES   Multiple thyroid  nodules    Ovarian mass, right 09/09/2014   PE (pulmonary embolism) 2016   Peripheral neuropathy    FINGER TIPS AND TOES NUMB SOME   Preeclampsia 1983   WITH PREGNANCY   PTSD (post-traumatic stress disorder)    Scoliosis    Seizures (HCC) AGE 58   NONE SINCE, HAD CHICKEN POX THEN    Past Surgical History:  Procedure Laterality Date   ANGIOGRAM TO LEG  08-13-15   RIGHT   CHOLECYSTECTOMY     IR GENERIC HISTORICAL  04/04/2016   IR RADIOLOGIST EVAL & MGMT 04/04/2016  Norleen Roulette, MD [facility] INTERV RAD   RADIOLOGY WITH ANESTHESIA N/A 07/17/2021   Procedure: MRI LUMBAR SPINE WITHOUT CONTRAST; MRI CERVICAL SPINE WITHOUT CONTRAST WITH ANESTHESIA;  Surgeon: Radiologist, Medication, MD;  Location: [facility] OR;  Service: Radiology;  Laterality: N/A;   THYROIDECTOMY N/A 11/17/2015   Procedure: TOTAL THYROIDECTOMY;  Surgeon: Krystal Spinner, MD;  Location: [facility] ORS;  Service: General;  Laterality: N/A;   UTERINE ARTERY EMBOLIZATION Bilateral 08/13/2015    Medications Prior to Admission  Medication Sig Dispense Refill Last Dose/Taking   albuterol  (VENTOLIN  HFA)  108 (90 Base) MCG/ACT inhaler Inhale 2 puffs into the lungs every 6 (six) hours as needed for wheezing or shortness of breath. 8 g 2 01/27/2024   apixaban  (ELIQUIS ) 5 MG TABS tablet TAKE ONE TABLET BY MOUTH TWICE DAILY MORNING AND EVENING 60 tablet 0 01/30/2024 at  9:00 AM   atorvastatin  (LIPITOR) 20 MG tablet TAKE ONE TABLET BY MOUTH ONCE DAILY AT BEDTIME 30 tablet 0 01/29/2024 Bedtime   budesonide -formoterol  (SYMBICORT ) 160-4.5 MCG/ACT inhaler Inhale 2 puffs into the lungs 2 (two) times daily. 2 each 6 01/30/2024 Morning   buPROPion  (WELLBUTRIN  XL) 300 MG 24 hr tablet Take 300 mg by mouth daily.  (Patient taking differently: Take 300 mg by mouth daily. Take along with 150mg  tablet)  2 01/30/2024 Morning   busPIRone  (BUSPAR ) 15 MG tablet Take 15 mg by mouth 2 (two) times daily.   01/30/2024 Morning   Cholecalciferol  (VITAMIN D3) 10 MCG (400 UNIT) CAPS TAKE ONE CAPSULE BY MOUTH ONCE DAILY (AM) 100 capsule 0 01/30/2024 Morning   cyclobenzaprine  (FLEXERIL ) 10 MG tablet Take 10 mg by mouth 3 (three) times daily as needed.   01/29/2024 Bedtime   doxepin  (SINEQUAN ) 100 MG capsule Take 100 mg by mouth at bedtime.   01/29/2024 Bedtime   Erenumab -aooe (AIMOVIG ) 140 MG/ML SOAJ Inject 140 mg into the skin every 28 (twenty-eight) days. 1.12 mL 11 Past Month   gabapentin  (NEURONTIN ) 300 MG capsule TAKE ONE CAPSULE BY MOUTH THREE TIMES DAILY (AM+NOON+BEDTIME) (Patient taking differently: Take 300 mg by mouth 3 (three) times daily. TAKE ONE CAPSULE BY MOUTH THREE TIMES DAILY (AM+NOON+BEDTIME)) 90 capsule 6 01/30/2024 Morning   ketoconazole  (NIZORAL ) 2 % cream Apply 1 Application topically daily. Apply to affected areas on face twice a day for 3 weeks after using nystatin -triamcinolone  for 1 week. (Patient taking differently: Apply 1 Application topically daily as needed (severe dermatitis).) 30 g 0 Past Week   levothyroxine  (SYNTHROID ) 300 MCG tablet Take 150-300 mcg by mouth See admin instructions. Take 1 tablet by mouth daily for 6  days, then take 1/2 tablet on the 7th day   01/30/2024 Morning   loratadine  (CLARITIN ) 10 MG tablet TAKE 1 TABLET (10 MG TOTAL) BY MOUTH DAILY (AM) 30 tablet 0 01/30/2024 Morning   metoprolol  tartrate (LOPRESSOR ) 50 MG tablet TAKE ONE TABLET BY MOUTH TWICE DAILY (AM+BEDTIME) (Patient taking differently: Take 75 mg by mouth 2 (two) times daily. TAKE ONE TABLET BY MOUTH TWICE DAILY (AM+BEDTIME)) 60 tablet 0 01/30/2024 Morning   montelukast  (SINGULAIR ) 10 MG tablet TAKE 1 TABLET BY MOUTH EVERY EVENING AT BEDTIME 30 tablet 0 01/29/2024 Bedtime   oxybutynin  (DITROPAN -XL) 5 MG 24 hr tablet Take 5 mg by mouth at bedtime.   01/29/2024 Bedtime   prazosin (MINIPRESS) 5 MG capsule Take 5 mg by mouth at bedtime.   01/29/2024 Bedtime   Semaglutide, 1 MG/DOSE, 2 MG/1.5ML SOPN Inject 2 mg into the skin once a week.   01/25/2024  topiramate  (TOPAMAX ) 200 MG tablet TAKE 1 TABLET (200 MG TOTAL) BY MOUTH AT BEDTIME. 90 tablet 0 01/29/2024 Bedtime   VRAYLAR  3 MG capsule Take 3 mg by mouth daily.   01/29/2024 Evening   WELLBUTRIN  XL 150 MG 24 hr tablet Take 150 mg by mouth every morning. Take along with 300mg  for a total daily dose of 450mg    01/30/2024 Morning   albuterol  (PROVENTIL ) (2.5 MG/3ML) 0.083% nebulizer solution Take 3 mLs (2.5 mg total) by nebulization every 6 (six) hours as needed for wheezing or shortness of breath. (Patient not taking: Reported on 01/30/2024) 3 mL 3 Not Taking   Allergies  Allergen Reactions   Ceftriaxone Anaphylaxis and Other (See Comments)    ROCEPHIN   Metformin  Anaphylaxis    ALL   Penicillin G Shortness Of Breath   Penicillins Shortness Of Breath    Has patient had a PCN reaction causing immediate rash, facial/tongue/throat swelling, SOB or lightheadedness with hypotension: Yes Has patient had a PCN reaction causing severe rash involving mucus membranes or skin necrosis: No Has patient had a PCN reaction that required hospitalization No Has patient had a PCN reaction occurring within the last  10 years: No If all of the above answers are NO, then may proceed with Cephalosporin use.    Shellfish Allergy Anaphylaxis   Shellfish-Derived Products Anaphylaxis   Citrus Rash   Flonase  [Fluticasone  Propionate]     Makes migraines worse   Gold-Containing Drug Products     HANDS ITCH   Nickel     HANDS SWELL    Social History   Tobacco Use   Smoking status: Former    Current packs/day: 0.00    Average packs/day: 0.5 packs/day for 40.0 years (20.0 ttl pk-yrs)    Types: Cigarettes    Start date: 11/03/1974    Quit date: 11/03/2014    Years since quitting: 9.2    Passive exposure: Past   Smokeless tobacco: Never  Substance Use Topics   Alcohol use: No    Alcohol/week: 0.0 standard drinks of alcohol    Family History  Problem Relation Age of Onset   Diabetes Mother    Breast cancer Mother    CAD Mother    Hypertension Mother    Alcohol abuse Father    Hypertension Father    Breast cancer Maternal Aunt    Breast cancer Maternal Aunt    Kidney nephrosis Son    Diabetes Son    Breast cancer Other 60 - 69     Objective:   Patient Vitals for the past 8 hrs:  BP Temp Temp src Pulse Resp SpO2  01/31/24 0800 122/73 97.7 F (36.5 C) Oral 80 15 96 %  01/31/24 0434 107/67 97.9 F (36.6 C) Oral 76 18 99 %   No intake/output data recorded. No intake/output data recorded.   DG Pelvis 1-2 Views Result Date: 01/31/2024 EXAM: 1 or 2 VIEW(S) XRAY OF THE PELVIS 01/31/2024 08:10:00 AM COMPARISON: CT AP 03/29/2023 CLINICAL HISTORY: 809823 Fall 809823. Fall; Pt fell yesterday s/t legs giving out. 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 ; Best image obtainable d/t body habitus. Fall; Pt fell yesterday s/t legs giving out. 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 No focal osseous lesion. No joint dislocation. Degenerative changes noted within the  imaged portions of the lower lumbar spine. SOFT  TISSUES: The soft tissues are unremarkable. IMPRESSION: 1. No acute findings. Electronically signed by: Waddell Calk MD 01/31/2024 08:28 AM EDT RP Workstation: [id number]   MR LUMBAR SPINE WO CONTRAST Result Date: 01/30/2024 EXAM: MRI LUMBAR SPINE 01/30/2024 06:30:41 PM TECHNIQUE: Multiplanar multisequence MRI of the lumbar spine was performed without the administration of intravenous contrast. COMPARISON: MRI of the lumbar spine dated 07/17/2021. CLINICAL HISTORY: Ataxia, lumbar trauma. Head trauma \\T \ C, T, L spine ataxia. Melinda [name] is a 58 y.o. female arriving to [facility] via [facility] on 01/30/2024 with past medical hx significant of migraine, hypertension and non ruptured aneurysm. On Eliquis  (apixaban ) daily. Code stroke was activated by EMS. FINDINGS: BONES AND ALIGNMENT: Normal alignment. Normal vertebral body heights. Bone marrow signal is unremarkable. SPINAL CORD: The conus terminates normally. SOFT TISSUES: No paraspinal mass. L1-L2: Mild central disc bulging but no significant spinal canal or neural foraminal stenosis. L2-L3: No significant disc herniation. No spinal canal stenosis or neural foraminal narrowing. L3-L4: No significant disc herniation. No spinal canal stenosis or neural foraminal narrowing. L4-L5: Mild diffuse disc bulging with mild central spinal canal stenosis and lateral recess stenosis, but no nerve root impingement. L5-S1: Right paracentral bulging disc osteophyte complex causing mild-to-moderate central spinal canal stenosis. The neural foramina are patent. There is no apparent nerve root impingement. IMPRESSION: 1. Mild central disc bulging at L1-2 without significant spinal canal or neural foraminal stenosis. 2. Mild diffuse disc bulging at L4-5 with mild central spinal canal stenosis and lateral recess stenosis, without nerve root impingement. 3. Right paracentral bulging disc osteophyte complex at L5-S1 causing  mild-to-moderate central spinal canal stenosis, without nerve root impingement. Neural foramina are patent. Electronically signed by: evalene coho 01/30/2024 06:51 PM EDT RP Workstation: [id number]   MR THORACIC SPINE WO CONTRAST Result Date: 01/30/2024 EXAM: MRI THORACIC SPINE WITHOUT INTRAVENOUS CONTRAST 01/30/2024 06:30:41 PM TECHNIQUE: Multiplanar multisequence MRI of the thoracic spine was performed without the administration of intravenous contrast. COMPARISON: None available. CLINICAL HISTORY: Ataxia, thoracic trauma. Head trauma \\T \ C, T, L spine ataxia. Melinda [name] is a 58 y.o. female arriving to [facility] via [facility] on 01/30/2024 with past medical hx significant of migraine, hypertension and non ruptured aneurysm. On Eliquis  (apixaban ) daily. Code stroke was activated by EMS. Uses a cane baseline. FINDINGS: BONES AND ALIGNMENT: Normal alignment. Normal vertebral body heights. There is a hemangioma within the T7 vertebral body. No abnormal enhancement. SPINAL CORD: Normal spinal cord volume. Normal spinal cord signal. SOFT TISSUES: Unremarkable. DEGENERATIVE CHANGES: There is a left posterolateral disc bulge at T6-7 which is causing mild-to-moderate left-sided spinal canal stenosis. The neural foramina are patent. There is minimal disc bulging at T1-2 and T2-3, without significant spinal canal stenosis. IMPRESSION: 1. Left posterolateral disc bulge at T6-7 causing mild-to-moderate left-sided spinal canal stenosis. Neural foramina are patent. 2. Minimal disc bulging at T1-2 and T2-3 without significant spinal canal stenosis. Electronically signed by: evalene coho 01/30/2024 06:48 PM EDT RP Workstation: [id number]   MR Cervical Spine Wo Contrast Result Date: 01/30/2024 EXAM: MRI CERVICAL SPINE WITHOUT CONTRAST 01/30/2024 06:30:41 PM TECHNIQUE: Multiplanar multisequence MRI of the cervical spine was performed without the administration of intravenous contrast. COMPARISON: MRI of the cervical  spine dated 07/17/2021. CLINICAL HISTORY: Ataxia, cervical trauma. Head trauma \\T \ C, T, L spine ataxia. Melinda [name] is a 58 y.o. female arriving to [facility] via [facility] on 01/30/2024 with past medical hx significant of migraine, hypertension and non ruptured aneurysm. On Eliquis  (  apixaban ) daily. Code stroke was activated by EMS. FINDINGS: BONES AND ALIGNMENT: Straightening of the normal cervical lordosis. SPINAL CORD: The spinal cord is focally indented at the C5-6 level, but normal in signal intensity. C3-C4: There is a broad-based disc bulge, which is eccentric to the left, causing mild-to-moderate central spinal canal stenosis and mild left neural foraminal stenosis. C4-C5: There is a broad-based disc bulge and there is bilateral uncovertebral joint hypertrophy, causing mild-to-moderate central spinal canal stenosis and moderate left neural foraminal stenosis. C5-C6: There is broad-based disc bulging and bilateral uncovertebral joint-type hypertrophy. The bulging disc osteophyte complex indents the ventral surface of the spinal cord and causes moderate-to-severe central spinal canal stenosis and moderate-to-severe left neural foraminal stenosis. There is also mild-to-moderate right neural foraminal stenosis. C6-C7: There is a broad-based disc bulge causing mild to moderate central spinal canal stenosis and mild bilateral neuroforaminal stenosis. IMPRESSION: 1. Straightening of the normal cervical lordosis. 2. At C3-4, broad-based disc bulge eccentric to the left causing mild-to-moderate central spinal canal stenosis and mild left neural foraminal stenosis. 3. At C4-5, broad-based disc bulge and bilateral uncovertebral joint hypertrophy causing mild-to-moderate central spinal canal stenosis and moderate left neural foraminal stenosis. 4. At C5-6, broad-based disc bulging and bilateral uncovertebral joint-type hypertrophy causing moderate-to-severe central spinal canal stenosis, moderate-to-severe left  neural foraminal stenosis, and mild-to-moderate right neural foraminal stenosis. The bulging disc osteophyte complex indents the ventral surface of the spinal cord, which is normal in signal intensity. 5. At C6-7, broad-based disc bulge causing mild to moderate central spinal canal stenosis and mild bilateral neuroforaminal stenosis. Electronically signed by: evalene coho 01/30/2024 06:41 PM EDT RP Workstation: [id number]   MR BRAIN WO CONTRAST Result Date: 01/30/2024 EXAM: MRI BRAIN WITHOUT CONTRAST 01/30/2024 06:30:41 PM TECHNIQUE: Multiplanar multisequence MRI of the head/brain was performed without the administration of intravenous contrast. COMPARISON: CT of the head and CT angiogram of the head dated 01/30/2024. CLINICAL HISTORY: Head trauma, abnormal mental status (Age 33-64y). Head trauma \\T \ C, T, L spine ataxia. Melinda [name] is a 58 y.o. female arriving to [facility] via [facility] on 01/30/2024 with past medical hx significant of migraine, hypertension and non ruptured aneurysm. On Eliquis  (apixaban ) daily. Code stroke was activated by EMS. FINDINGS: BRAIN AND VENTRICLES: No acute infarct. No intracranial hemorrhage. No mass. No midline shift. No hydrocephalus. The sella is unremarkable. Normal flow voids. Mild cerebral white matter disease. ORBITS: No acute abnormality. SINUSES AND MASTOIDS: Mild mucosal disease within the right maxillary sinus. No acute abnormality. BONES AND SOFT TISSUES: Normal marrow signal. No acute soft tissue abnormality. IMPRESSION: 1. No acute intracranial abnormality. 2. Mild cerebral white matter disease. 3. Mild mucosal disease within the right maxillary sinus. Electronically signed by: evalene coho 01/30/2024 06:37 PM EDT RP Workstation: [id number]   CT ANGIO HEAD NECK W WO CM (CODE STROKE) Result Date: 01/30/2024 CLINICAL DATA:  Neuro deficit, acute, stroke suspected. EXAM: CT ANGIOGRAPHY HEAD AND NECK WITH AND WITHOUT CONTRAST TECHNIQUE: Multidetector CT  imaging of the head and neck was performed using the standard protocol during bolus administration of intravenous contrast. Multiplanar CT image reconstructions and MIPs were obtained to evaluate the vascular anatomy. Carotid stenosis measurements (when applicable) are obtained utilizing NASCET criteria, using the distal internal carotid diameter as the denominator. RADIATION DOSE REDUCTION: This exam was performed according to the departmental dose-optimization program which includes automated exposure control, adjustment of the mA and/or kV according to patient size and/or use of iterative reconstruction technique. CONTRAST:  75mL OMNIPAQUE   IOHEXOL  350 MG/ML SOLN COMPARISON:  CT angiogram head/neck 05/11/2021. FINDINGS: CTA NECK FINDINGS Aortic arch: Standard aortic branching. Atherosclerotic plaque within the aortic arch. No hemodynamically significant innominate or proximal subclavian artery stenosis. Right carotid system: CCA and ICA patent within the neck without stenosis or significant atherosclerotic disease. Partially retropharyngeal course of the cervical ICA. Left carotid system: CCA and ICA patent within the neck without stenosis or significant atherosclerotic disease. Vertebral arteries: Codominant and patent within the neck without stenosis or significant atherosclerotic disease. Skeleton: The patient is edentulous. Cervical spondylosis. No acute fracture or aggressive osseous lesion. Other neck: Prior thyroidectomy. No mass or lymphadenopathy identified within the neck. Upper chest: No acute finding. Review of the MIP images confirms the above findings CTA HEAD FINDINGS Anterior circulation: The intracranial internal carotid arteries are patent. Atherosclerotic plaque within both vessels with no more than mild stenosis. The M1 middle cerebral arteries are patent. No M2 proximal branch occlusion or high-grade proximal stenosis. The anterior cerebral arteries are patent. No intracranial aneurysm is  identified. Posterior circulation: The intracranial vertebral arteries are patent. The basilar artery is patent. The posterior cerebral arteries are patent. Tortuosity of the proximal right posterior cerebral artery with small focal dilation as the vessel turns, unchanged form the CTA of 05/11/2021 (for instance as seen on series 10, image 20). Posterior communicating arteries are diminutive or absent, bilaterally. Venous sinuses: Within the limitations of contrast timing, no convincing thrombus. Anatomic variants: As described. Review of the MIP images confirms the above findings No emergent large vessel occlusion identified. These results were communicated to Rocky Likes, NP at 4:45 pmon 8/1/2025by text page via the [facility] messaging system. IMPRESSION: CTA neck: 1. The common carotid, internal carotid and vertebral arteries are patent within the neck without stenosis or significant atherosclerotic disease. 2. Aortic Atherosclerosis (ICD10-I70.0). CTA head: 1. No proximal intracranial large vessel occlusion or high-grade proximal arterial stenosis identified. 2. Tortuous proximal right posterior cerebral artery with focal dilation, which could potentially reflect a small aneurysm. This is unchanged from the prior CT of 05/11/2021. Electronically Signed   By: Rockey Childs D.O.   On: 01/30/2024 16:47   CT HEAD CODE STROKE WO CONTRAST Result Date: 01/30/2024 CLINICAL DATA:  Code stroke. Neuro deficit, acute, stroke suspected. EXAM: CT HEAD WITHOUT CONTRAST TECHNIQUE: Contiguous axial images were obtained from the base of the skull through the vertex without intravenous contrast. RADIATION DOSE REDUCTION: This exam was performed according to the departmental dose-optimization program which includes automated exposure control, adjustment of the mA and/or kV according to patient size and/or use of iterative reconstruction technique. COMPARISON:  Head CT 10/11/2022. FINDINGS: Brain: No age-advanced or lobar predominant  cerebral atrophy. There is no acute intracranial hemorrhage. No demarcated cortical infarct. No extra-axial fluid collection. No evidence of an intracranial mass. No midline shift. Vascular: No hyperdense vessel.  Atherosclerotic calcifications. Skull: No calvarial fracture or aggressive osseous lesion. Sinuses/Orbits: No mass or acute finding within the imaged orbits. No significant paranasal sinus disease at the imaged levels. ASPECTS [facility] Stroke Program Early CT Score) - Ganglionic level infarction (caudate, lentiform nuclei, internal capsule, insula, M1-M3 cortex): 7 - Supraganglionic infarction (M4-M6 cortex): 3 Total score (0-10 with 10 being normal): 10 No evidence of an acute intracranial abnormality. These results were communicated to NP Rocky Likes, NP at 4:13 pmon 8/1/2025by text page via the [facility] messaging system. IMPRESSION: No evidence of an acute intracranial abnormality. Electronically Signed   By: Rockey Childs D.O.   On: 01/30/2024 16:13  Awake, alert, oriented Speech fluent, appropriate CN grossly intact 5/5 BUE/BLE Sensation intact x4 Neg clonus Neg hoffman DTR globally decreased   Assessment:   Principal Problem:   Lower extremity weakness Active Problems:   Migraine   Morbid obesity (HCC)   Mild persistent asthma   History of pulmonary embolism   Controlled type 2 diabetes mellitus with complication, with long-term current use of insulin  (HCC)   S/P thyroidectomy   Primary hypertension   Postoperative hypothyroidism   Type 2 diabetes mellitus with hyperglycemia (HCC)   Cervical radiculopathy   This is a 58 yo with multilevel degenerative spondylosis with cervical spinal stenosis, mildly worsened compared to MRI 2023.   Plan:   -Today she has good strength to BUE/BLE and is reportedly ambulating independently. Recommend work with therapies as tolerated. Hopefully she will be able to dc home to f/u w/ outpatient spine MD, she has been previously seen at  [facility], Dr. Lucilla.   Deola Rewis CAYLIN Alexios Keown, PA-C

## 2024-01-31 NOTE — Progress Notes (Signed)
 PROGRESS NOTE  CHELESEA WEIAND FMW:[id number] DOB: January 11, 1966 DOA: 01/30/2024 PCP: Cleotilde, [place]  E, PA   LOS: 0 days   Brief Narrative / Interim history: 58 y.o. female with history of diabetes mellitus type 2, sleep apnea, depression, hypertension, history of PE, asthma, hypothyroidism postoperative was brought to the ER after patient had a fall at home after she woke up from sleep and was trying to get out of the bed.  She fell onto her buttocks and hurt.  Following which she had some dysarthria and weakness of the extremities.  Patient was brought in as a code stroke.   Subjective / 24h Interval events: She tells me she is feeling a little bit stronger and could walk a little bit more today  Assesement and Plan: Principal Problem:   Lower extremity weakness Active Problems:   Migraine   Morbid obesity (HCC)   Mild persistent asthma   History of pulmonary embolism   Controlled type 2 diabetes mellitus with complication, with long-term current use of insulin  (HCC)   S/P thyroidectomy   Primary hypertension   Postoperative hypothyroidism   Type 2 diabetes mellitus with hyperglycemia (HCC)   Cervical radiculopathy   Principal problem Bilateral lower extremity weakness -  with C-spine showing moderate to severe canal stenosis at C5-6.  - Neurosurgery consulted, awaiting input, given some recovery, no need for surgery now and will follow up as an outpatient.  PT evaluated patient, she is still very unsteady, has a few steps at home, will need to repeat the PT session tomorrow - Discussed with neurology, they do not think they have a lot to offer, needs outpatient EMG/NCS  Active problems Diabetes mellitus type 2 - takes Ozempic at home.  A1c 6.1  Hypertension -continue home medications  History of PE -continue Eliquis   Postoperative hypothyroidism - on Synthroid .  Hyperlipidemia -continue statin.  Depression - on Vraylar  and Wellbutrin .  BuSpar  for anxiety.  OSA - on CPAP  at bedtime.  History of migraine - on Topamax  for prophylaxis and Aimovig .  Asthma - on Breo Singulair .  Chronic pain - on gabapentin .  Confirmed on PDMP website.  Scheduled Meds:  apixaban   5 mg Oral BID   atorvastatin   20 mg Oral Daily   buPROPion   450 mg Oral Daily   busPIRone   15 mg Oral BID   cariprazine   3 mg Oral Daily   doxepin   100 mg Oral QHS   fluticasone  furoate-vilanterol  1 puff Inhalation Daily   gabapentin   300 mg Oral TID   insulin  aspart  0-9 Units Subcutaneous TID WC   levothyroxine   150 mcg Oral Every Saturday   [START ON 02/01/2024] levothyroxine   300 mcg Oral Once per day on Sunday Monday Tuesday Wednesday Thursday Friday   metoprolol  tartrate  75 mg Oral BID   montelukast   10 mg Oral QHS   oxybutynin   5 mg Oral QHS   potassium chloride   20 mEq Oral Once   topiramate   200 mg Oral QHS   Continuous Infusions: PRN Meds:.albuterol , cyclobenzaprine   Current Outpatient Medications  Medication Instructions   Aimovig  140 mg, Subcutaneous, Every 28 days   albuterol  (PROVENTIL ) 2.5 mg, Nebulization, Every 6 hours PRN   albuterol  (VENTOLIN  HFA) 108 (90 Base) MCG/ACT inhaler 2 puffs, Inhalation, Every 6 hours PRN   apixaban  (ELIQUIS ) 5 MG TABS tablet TAKE ONE TABLET BY MOUTH TWICE DAILY MORNING AND EVENING   atorvastatin  (LIPITOR) 20 MG tablet TAKE ONE TABLET BY MOUTH ONCE DAILY AT BEDTIME  budesonide -formoterol  (SYMBICORT ) 160-4.5 MCG/ACT inhaler 2 puffs, Inhalation, 2 times daily   buPROPion  (WELLBUTRIN  XL) 300 mg, Daily   busPIRone  (BUSPAR ) 15 mg, 2 times daily   Cholecalciferol  (VITAMIN D3) 10 MCG (400 UNIT) CAPS TAKE ONE CAPSULE BY MOUTH ONCE DAILY (AM)   cyclobenzaprine  (FLEXERIL ) 10 mg, Oral, 3 times daily PRN   doxepin  (SINEQUAN ) 100 mg, Daily at bedtime   gabapentin  (NEURONTIN ) 300 MG capsule TAKE ONE CAPSULE BY MOUTH THREE TIMES DAILY (AM+NOON+BEDTIME)   ketoconazole  (NIZORAL ) 2 % cream 1 Application, Topical, Daily, Apply to affected areas on face twice  a day for 3 weeks after using nystatin -triamcinolone  for 1 week.   levothyroxine  (SYNTHROID ) 150-300 mcg, See admin instructions   loratadine  (CLARITIN ) 10 MG tablet TAKE 1 TABLET (10 MG TOTAL) BY MOUTH DAILY (AM)   metoprolol  tartrate (LOPRESSOR ) 50 MG tablet TAKE ONE TABLET BY MOUTH TWICE DAILY (AM+BEDTIME)   montelukast  (SINGULAIR ) 10 MG tablet TAKE 1 TABLET BY MOUTH EVERY EVENING AT BEDTIME   oxybutynin  (DITROPAN -XL) 5 mg, Daily at bedtime   prazosin (MINIPRESS) 5 mg, Daily at bedtime   Semaglutide (1 MG/DOSE) 2 mg, Weekly   topiramate  (TOPAMAX ) 200 mg, Oral, Daily at bedtime   Vraylar  3 mg, Daily   Wellbutrin  XL 150 mg, Oral, Every morning, Take along with 300mg  for a total daily dose of 450mg     Diet Orders (From admission, onward)     Start     Ordered   01/31/24 0341  Diet heart healthy/carb modified Room service appropriate? Yes; Fluid consistency: Thin  Diet effective now       Question Answer Comment  Diet-HS Snack? Nothing   Room service appropriate? Yes   Fluid consistency: Thin      01/31/24 0341            DVT prophylaxis:  apixaban  (ELIQUIS ) tablet 5 mg   Lab Results  Component Value Date   PLT 210 01/31/2024      Code Status: Full Code  Family Communication: no family at bedside   Status is: Observation The patient will require care spanning > 2 midnights and should be moved to inpatient because: Unsafe to be discharged today   Level of care: Telemetry Medical  Consultants:  Neurology Neurosurgery   Objective: Vitals:   01/30/24 2254 01/30/24 2323 01/31/24 0434 01/31/24 0800  BP: 111/71 111/71 107/67 122/73  Pulse: 80 80 76 80  Resp: 18 18 18 15   Temp: 97.8 F (36.6 C) 97.8 F (36.6 C) 97.9 F (36.6 C) 97.7 F (36.5 C)  TempSrc: Oral Oral Oral Oral  SpO2: 98%  99% 96%  Weight:  136 kg    Height:  5' 9 (1.753 m)     No intake or output data in the 24 hours ending 01/31/24 0903 Wt Readings from Last 3 Encounters:  01/30/24 136 kg   01/06/24 136.1 kg  08/16/23 136.1 kg    Examination:  Constitutional: NAD Eyes: no scleral icterus ENMT: Mucous membranes are moist.  Neck: normal, supple Respiratory: clear to auscultation bilaterally, no wheezing, no crackles.  Cardiovascular: Regular rate and rhythm, no murmurs / rubs / gallops.  Abdomen: non distended, no tenderness. Bowel sounds positive.   Data Reviewed: I have independently reviewed following labs and imaging studies   CBC Recent Labs  Lab 01/30/24 1552 01/30/24 1555 01/31/24 0427  WBC  --  8.8 8.0  HGB 13.3 13.4 12.6  HCT 39.0 41.8 39.2  PLT  --  227 210  MCV  --  93.1 91.8  MCH  --  29.8 29.5  MCHC  --  32.1 32.1  RDW  --  13.7 14.0  LYMPHSABS  --  1.6 3.4  MONOABS  --  0.5 0.5  EOSABS  --  0.1 0.1  BASOSABS  --  0.0 0.0    Recent Labs  Lab 01/30/24 1552 01/30/24 1555 01/31/24 0427  NA 146* 141 142  K 3.7 3.8 3.4*  CL 113* 115* 113*  CO2  --  21* 23  GLUCOSE 163* 161* 96  BUN 15 13 12   CREATININE 1.10* 1.17* 0.92  CALCIUM   --  8.7* 8.6*  AST  --  27  --   ALT  --  47*  --   ALKPHOS  --  97  --   BILITOT  --  0.4  --   ALBUMIN  --  3.3*  --   INR  --  1.3*  --     ------------------------------------------------------------------------------------------------------------------ No results for input(s): CHOL, HDL, LDLCALC, TRIG, CHOLHDL, LDLDIRECT in the last 72 hours.  Lab Results  Component Value Date   HGBA1C 6.1 04/20/2021   ------------------------------------------------------------------------------------------------------------------ No results for input(s): TSH, T4TOTAL, T3FREE, THYROIDAB in the last 72 hours.  Invalid input(s): FREET3  Cardiac Enzymes No results for input(s): CKMB, TROPONINI, MYOGLOBIN in the last 168 hours.  Invalid input(s): CK ------------------------------------------------------------------------------------------------------------------    Component Value  Date/Time   BNP 18.6 09/19/2017 1810    CBG: Recent Labs  Lab 01/30/24 1548 01/31/24 0618  GLUCAP 154* 90    No results found for this or any previous visit (from the past 240 hours).   Radiology Studies: DG Pelvis 1-2 Views Result Date: 01/31/2024 EXAM: 1 or 2 VIEW(S) XRAY OF THE PELVIS 01/31/2024 08:10:00 AM COMPARISON: CT AP 03/29/2023 CLINICAL HISTORY: 809823 Fall 809823. Fall; Pt fell yesterday s/t legs giving out. 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 ; Best image obtainable d/t body habitus. Fall; Pt fell yesterday s/t legs giving out. 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 No focal osseous lesion. No joint dislocation. Degenerative changes noted within the imaged portions of the lower lumbar spine. SOFT TISSUES: The soft tissues are unremarkable. IMPRESSION: 1. No acute findings. Electronically signed by: Waddell Calk MD 01/31/2024 08:28 AM EDT RP Workstation: [id number]   MR LUMBAR SPINE WO CONTRAST Result Date: 01/30/2024 EXAM: MRI LUMBAR SPINE 01/30/2024 06:30:41 PM TECHNIQUE: Multiplanar multisequence MRI of the lumbar spine was performed without the administration of intravenous contrast. COMPARISON: MRI of the lumbar spine dated 07/17/2021. CLINICAL HISTORY: Ataxia, lumbar trauma. Head trauma \\T \ C, T, L spine ataxia. DINNA SEVERS is a 58 y.o. female arriving to [facility] via [facility] on 01/30/2024 with past medical hx significant of migraine, hypertension and non ruptured aneurysm. On Eliquis  (apixaban ) daily. Code stroke was activated by EMS. FINDINGS: BONES AND ALIGNMENT: Normal alignment. Normal vertebral body heights. Bone marrow signal is unremarkable. SPINAL CORD: The conus terminates normally. SOFT TISSUES: No paraspinal mass. L1-L2: Mild central disc bulging but no significant spinal canal or neural foraminal stenosis. L2-L3:  No significant disc herniation. No spinal canal stenosis or neural foraminal narrowing. L3-L4: No significant disc herniation. No spinal canal stenosis or neural foraminal narrowing. L4-L5: Mild diffuse disc bulging with mild central spinal canal stenosis and lateral recess stenosis, but no nerve root impingement. L5-S1: Right paracentral  bulging disc osteophyte complex causing mild-to-moderate central spinal canal stenosis. The neural foramina are patent. There is no apparent nerve root impingement. IMPRESSION: 1. Mild central disc bulging at L1-2 without significant spinal canal or neural foraminal stenosis. 2. Mild diffuse disc bulging at L4-5 with mild central spinal canal stenosis and lateral recess stenosis, without nerve root impingement. 3. Right paracentral bulging disc osteophyte complex at L5-S1 causing mild-to-moderate central spinal canal stenosis, without nerve root impingement. Neural foramina are patent. Electronically signed by: evalene coho 01/30/2024 06:51 PM EDT RP Workstation: [id number]   MR THORACIC SPINE WO CONTRAST Result Date: 01/30/2024 EXAM: MRI THORACIC SPINE WITHOUT INTRAVENOUS CONTRAST 01/30/2024 06:30:41 PM TECHNIQUE: Multiplanar multisequence MRI of the thoracic spine was performed without the administration of intravenous contrast. COMPARISON: None available. CLINICAL HISTORY: Ataxia, thoracic trauma. Head trauma \\T \ C, T, L spine ataxia. KODEE DRURY is a 58 y.o. female arriving to [facility] via [facility] on 01/30/2024 with past medical hx significant of migraine, hypertension and non ruptured aneurysm. On Eliquis  (apixaban ) daily. Code stroke was activated by EMS. Uses a cane baseline. FINDINGS: BONES AND ALIGNMENT: Normal alignment. Normal vertebral body heights. There is a hemangioma within the T7 vertebral body. No abnormal enhancement. SPINAL CORD: Normal spinal cord volume. Normal spinal cord signal. SOFT TISSUES: Unremarkable. DEGENERATIVE CHANGES: There is a left  posterolateral disc bulge at T6-7 which is causing mild-to-moderate left-sided spinal canal stenosis. The neural foramina are patent. There is minimal disc bulging at T1-2 and T2-3, without significant spinal canal stenosis. IMPRESSION: 1. Left posterolateral disc bulge at T6-7 causing mild-to-moderate left-sided spinal canal stenosis. Neural foramina are patent. 2. Minimal disc bulging at T1-2 and T2-3 without significant spinal canal stenosis. Electronically signed by: evalene coho 01/30/2024 06:48 PM EDT RP Workstation: [id number]   MR Cervical Spine Wo Contrast Result Date: 01/30/2024 EXAM: MRI CERVICAL SPINE WITHOUT CONTRAST 01/30/2024 06:30:41 PM TECHNIQUE: Multiplanar multisequence MRI of the cervical spine was performed without the administration of intravenous contrast. COMPARISON: MRI of the cervical spine dated 07/17/2021. CLINICAL HISTORY: Ataxia, cervical trauma. Head trauma \\T \ C, T, L spine ataxia. ALLE DIFABIO is a 58 y.o. female arriving to [facility] via [facility] on 01/30/2024 with past medical hx significant of migraine, hypertension and non ruptured aneurysm. On Eliquis  (apixaban ) daily. Code stroke was activated by EMS. FINDINGS: BONES AND ALIGNMENT: Straightening of the normal cervical lordosis. SPINAL CORD: The spinal cord is focally indented at the C5-6 level, but normal in signal intensity. C3-C4: There is a broad-based disc bulge, which is eccentric to the left, causing mild-to-moderate central spinal canal stenosis and mild left neural foraminal stenosis. C4-C5: There is a broad-based disc bulge and there is bilateral uncovertebral joint hypertrophy, causing mild-to-moderate central spinal canal stenosis and moderate left neural foraminal stenosis. C5-C6: There is broad-based disc bulging and bilateral uncovertebral joint-type hypertrophy. The bulging disc osteophyte complex indents the ventral surface of the spinal cord and causes moderate-to-severe central spinal canal stenosis  and moderate-to-severe left neural foraminal stenosis. There is also mild-to-moderate right neural foraminal stenosis. C6-C7: There is a broad-based disc bulge causing mild to moderate central spinal canal stenosis and mild bilateral neuroforaminal stenosis. IMPRESSION: 1. Straightening of the normal cervical lordosis. 2. At C3-4, broad-based disc bulge eccentric to the left causing mild-to-moderate central spinal canal stenosis and mild left neural foraminal stenosis. 3. At C4-5, broad-based disc bulge and bilateral uncovertebral joint hypertrophy causing mild-to-moderate central spinal canal stenosis and moderate left neural foraminal stenosis. 4. At  C5-6, broad-based disc bulging and bilateral uncovertebral joint-type hypertrophy causing moderate-to-severe central spinal canal stenosis, moderate-to-severe left neural foraminal stenosis, and mild-to-moderate right neural foraminal stenosis. The bulging disc osteophyte complex indents the ventral surface of the spinal cord, which is normal in signal intensity. 5. At C6-7, broad-based disc bulge causing mild to moderate central spinal canal stenosis and mild bilateral neuroforaminal stenosis. Electronically signed by: evalene coho 01/30/2024 06:41 PM EDT RP Workstation: [id number]   MR BRAIN WO CONTRAST Result Date: 01/30/2024 EXAM: MRI BRAIN WITHOUT CONTRAST 01/30/2024 06:30:41 PM TECHNIQUE: Multiplanar multisequence MRI of the head/brain was performed without the administration of intravenous contrast. COMPARISON: CT of the head and CT angiogram of the head dated 01/30/2024. CLINICAL HISTORY: Head trauma, abnormal mental status (Age 48-64y). Head trauma \\T \ C, T, L spine ataxia. BURNIE HANK is a 58 y.o. female arriving to [facility] via [facility] on 01/30/2024 with past medical hx significant of migraine, hypertension and non ruptured aneurysm. On Eliquis  (apixaban ) daily. Code stroke was activated by EMS. FINDINGS: BRAIN AND VENTRICLES: No acute infarct.  No intracranial hemorrhage. No mass. No midline shift. No hydrocephalus. The sella is unremarkable. Normal flow voids. Mild cerebral white matter disease. ORBITS: No acute abnormality. SINUSES AND MASTOIDS: Mild mucosal disease within the right maxillary sinus. No acute abnormality. BONES AND SOFT TISSUES: Normal marrow signal. No acute soft tissue abnormality. IMPRESSION: 1. No acute intracranial abnormality. 2. Mild cerebral white matter disease. 3. Mild mucosal disease within the right maxillary sinus. Electronically signed by: evalene coho 01/30/2024 06:37 PM EDT RP Workstation: [id number]   CT ANGIO HEAD NECK W WO CM (CODE STROKE) Result Date: 01/30/2024 CLINICAL DATA:  Neuro deficit, acute, stroke suspected. EXAM: CT ANGIOGRAPHY HEAD AND NECK WITH AND WITHOUT CONTRAST TECHNIQUE: Multidetector CT imaging of the head and neck was performed using the standard protocol during bolus administration of intravenous contrast. Multiplanar CT image reconstructions and MIPs were obtained to evaluate the vascular anatomy. Carotid stenosis measurements (when applicable) are obtained utilizing NASCET criteria, using the distal internal carotid diameter as the denominator. RADIATION DOSE REDUCTION: This exam was performed according to the departmental dose-optimization program which includes automated exposure control, adjustment of the mA and/or kV according to patient size and/or use of iterative reconstruction technique. CONTRAST:  75mL OMNIPAQUE  IOHEXOL  350 MG/ML SOLN COMPARISON:  CT angiogram head/neck 05/11/2021. FINDINGS: CTA NECK FINDINGS Aortic arch: Standard aortic branching. Atherosclerotic plaque within the aortic arch. No hemodynamically significant innominate or proximal subclavian artery stenosis. Right carotid system: CCA and ICA patent within the neck without stenosis or significant atherosclerotic disease. Partially retropharyngeal course of the cervical ICA. Left carotid system: CCA and ICA patent  within the neck without stenosis or significant atherosclerotic disease. Vertebral arteries: Codominant and patent within the neck without stenosis or significant atherosclerotic disease. Skeleton: The patient is edentulous. Cervical spondylosis. No acute fracture or aggressive osseous lesion. Other neck: Prior thyroidectomy. No mass or lymphadenopathy identified within the neck. Upper chest: No acute finding. Review of the MIP images confirms the above findings CTA HEAD FINDINGS Anterior circulation: The intracranial internal carotid arteries are patent. Atherosclerotic plaque within both vessels with no more than mild stenosis. The M1 middle cerebral arteries are patent. No M2 proximal branch occlusion or high-grade proximal stenosis. The anterior cerebral arteries are patent. No intracranial aneurysm is identified. Posterior circulation: The intracranial vertebral arteries are patent. The basilar artery is patent. The posterior cerebral arteries are patent. Tortuosity of the proximal right posterior cerebral artery with small  focal dilation as the vessel turns, unchanged form the CTA of 05/11/2021 (for instance as seen on series 10, image 20). Posterior communicating arteries are diminutive or absent, bilaterally. Venous sinuses: Within the limitations of contrast timing, no convincing thrombus. Anatomic variants: As described. Review of the MIP images confirms the above findings No emergent large vessel occlusion identified. These results were communicated to Rocky Likes, NP at 4:45 pmon 8/1/2025by text page via the [facility] messaging system. IMPRESSION: CTA neck: 1. The common carotid, internal carotid and vertebral arteries are patent within the neck without stenosis or significant atherosclerotic disease. 2. Aortic Atherosclerosis (ICD10-I70.0). CTA head: 1. No proximal intracranial large vessel occlusion or high-grade proximal arterial stenosis identified. 2. Tortuous proximal right posterior cerebral artery  with focal dilation, which could potentially reflect a small aneurysm. This is unchanged from the prior CT of 05/11/2021. Electronically Signed   By: Rockey Childs D.O.   On: 01/30/2024 16:47   CT HEAD CODE STROKE WO CONTRAST Result Date: 01/30/2024 CLINICAL DATA:  Code stroke. Neuro deficit, acute, stroke suspected. EXAM: CT HEAD WITHOUT CONTRAST TECHNIQUE: Contiguous axial images were obtained from the base of the skull through the vertex without intravenous contrast. RADIATION DOSE REDUCTION: This exam was performed according to the departmental dose-optimization program which includes automated exposure control, adjustment of the mA and/or kV according to patient size and/or use of iterative reconstruction technique. COMPARISON:  Head CT 10/11/2022. FINDINGS: Brain: No age-advanced or lobar predominant cerebral atrophy. There is no acute intracranial hemorrhage. No demarcated cortical infarct. No extra-axial fluid collection. No evidence of an intracranial mass. No midline shift. Vascular: No hyperdense vessel.  Atherosclerotic calcifications. Skull: No calvarial fracture or aggressive osseous lesion. Sinuses/Orbits: No mass or acute finding within the imaged orbits. No significant paranasal sinus disease at the imaged levels. ASPECTS [facility] Stroke Program Early CT Score) - Ganglionic level infarction (caudate, lentiform nuclei, internal capsule, insula, M1-M3 cortex): 7 - Supraganglionic infarction (M4-M6 cortex): 3 Total score (0-10 with 10 being normal): 10 No evidence of an acute intracranial abnormality. These results were communicated to NP Rocky Likes, NP at 4:13 pmon 8/1/2025by text page via the [facility] messaging system. IMPRESSION: No evidence of an acute intracranial abnormality. Electronically Signed   By: Rockey Childs D.O.   On: 01/30/2024 16:13   Nilda Fendt, MD, PhD [facility]  Between 7 am - 7 pm I am available, please contact me via Amion (for emergencies) or Securechat (non  urgent messages)  Between 7 pm - 7 am I am not available, please contact night coverage MD/APP via Amion

## 2024-01-31 NOTE — Progress Notes (Signed)
Patient refused cpap for the night  

## 2024-01-31 NOTE — Evaluation (Signed)
 Physical Therapy Evaluation Patient Details Name: Melinda [name] MRN: [id number] DOB: 17-Feb-1966 Today's Date: 01/31/2024  History of Present Illness  Pt is a 58 yo female presenting to [facility] on 01/30/24 for dysarthria and BLE weakness resulting in a fall. 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 PMH of diabetes mellitus type 2, sleep apnea, depression, hypertension, history of PE, asthma, hypothyroidism.  Clinical Impression   Pt admitted with above diagnosis. Lives at home alone, in a single-level home with 6 steps to enter; Prior to admission, pt was able to manage household walking with cane/rollator, Independent with basic ADLs; Presents to PT with L sided weakness, incr fall risk; Able to walk in hallway with Rollator RW and one seated rest break; notable that incr walking distance brought on low back pain; Anticipate the stairs to enter her place will be a challenge; Pt currently with functional limitations due to the deficits listed below (see PT Problem List). Pt will benefit from skilled PT to increase their independence and safety with mobility to allow discharge to the venue listed below.           If plan is discharge home, recommend the following: Help with stairs or ramp for entrance;Assistance with cooking/housework   Can travel by private vehicle        Equipment Recommendations None recommended by PT  Recommendations for Other Services       Functional Status Assessment Patient has had a recent decline in their functional status and demonstrates the ability to make significant improvements in function in a reasonable and predictable amount of time.     Precautions / Restrictions Precautions Precautions: Fall Recall of Precautions/Restrictions: Intact Restrictions Weight Bearing Restrictions Per Provider Order: No      Mobility  Bed Mobility Overal bed mobility: Modified Independent Bed Mobility: Supine  to Sit     Supine to sit: Modified independent (Device/Increase time)     General bed mobility comments: incr time    Transfers Overall transfer level: Needs assistance Equipment used: None Transfers: Sit to/from Stand Sit to Stand: Contact guard assist           General transfer comment: Slow rise from elevated bed; uses bedrials to push up and for support; stood x2 to/from wide rollator    Ambulation/Gait Ambulation/Gait assistance: Contact guard assist Gait Distance (Feet): 50 Feet Assistive device: Rollator (4 wheels) Gait Pattern/deviations: Step-through pattern, Decreased step length - right, Decreased step length - left       General Gait Details: Slow; cues to self-monitor for activity tolerance; shows good knowledge and use of rollator; this PT guarded from the L side, which pt stated is weaker  Careers information officer     Tilt Bed    Modified Rankin (Stroke Patients Only)       Balance     Sitting balance-Leahy Scale: Good       Standing balance-Leahy Scale: Fair                               Pertinent Vitals/Pain Pain Assessment Pain Assessment: Faces Faces Pain Scale: Hurts little more Pain Location: Low back pain; onset of pain after bout of ambulation Pain Descriptors / Indicators: Aching Pain Intervention(s): Monitored during session    Home Living Family/patient expects to be discharged to:: Private residence Living Arrangements:  Alone Available Help at Discharge: Other (Comment) (Personal care attentdant comes Tues and Thursdays; helps with household tasks) Type of Home: House Home Access: Stairs to enter Entrance Stairs-Rails: Can reach both;Right;Left Entrance Stairs-Number of Steps: 6-7   Home Layout: One level Home Equipment: Shower seat;Grab bars - tub/shower;Grab bars - toilet;Rollator (4 wheels);Hand held shower head;Cane - single point Additional Comments: larger Rollator. 1 fall  yesterday.    Prior Function Prior Level of Function : Independent/Modified Independent;History of Falls (last six months)             Mobility Comments: mod i Rollator ADLs Comments: ind     Extremity/Trunk Assessment   Upper Extremity Assessment Upper Extremity Assessment: Defer to OT evaluation    Lower Extremity Assessment Lower Extremity Assessment: Generalized weakness (Reports LLE is weaker than RLE)       Communication   Communication Communication: No apparent difficulties    Cognition Arousal: Alert Behavior During Therapy: WFL for tasks assessed/performed   PT - Cognitive impairments: No apparent impairments                         Following commands: Intact       Cueing Cueing Techniques: Verbal cues     General Comments General comments (skin integrity, edema, etc.): NAD on room air    Exercises     Assessment/Plan    PT Assessment Patient needs continued PT services  PT Problem List Decreased strength;Decreased activity tolerance;Decreased balance;Decreased mobility;Decreased safety awareness;Decreased knowledge of precautions;Pain       PT Treatment Interventions DME instruction;Gait training;Stair training;Functional mobility training;Therapeutic activities;Therapeutic exercise;Balance training;Patient/family education;Manual techniques;Neuromuscular re-education    PT Goals (Current goals can be found in the Care Plan section)  Acute Rehab PT Goals Patient Stated Goal: more confidence in ability to manage at home PT Goal Formulation: With patient Time For Goal Achievement: 02/14/24 Potential to Achieve Goals: Good    Frequency Min 2X/week     Co-evaluation               AM-PAC PT 6 Clicks Mobility  Outcome Measure Help needed turning from your back to your side while in a flat bed without using bedrails?: None Help needed moving from lying on your back to sitting on the side of a flat bed without using  bedrails?: None Help needed moving to and from a bed to a chair (including a wheelchair)?: A Little Help needed standing up from a chair using your arms (e.g., wheelchair or bedside chair)?: A Little Help needed to walk in hospital room?: A Little Help needed climbing 3-5 steps with a railing? : A Lot 6 Click Score: 19    End of Session Equipment Utilized During Treatment: Gait belt Activity Tolerance: Patient tolerated treatment well Patient left: Other (comment) (sitting on rollator in room, about to start with OT) Nurse Communication: Mobility status PT Visit Diagnosis: Unsteadiness on feet (R26.81);Repeated falls (R29.6);Muscle weakness (generalized) (M62.81)    Time: 8960-8884 PT Time Calculation (min) (ACUTE ONLY): 36 min   Charges:   PT Evaluation $PT Eval Moderate Complexity: 1 Mod PT Treatments $Gait Training: 8-22 mins PT General Charges $$ ACUTE PT VISIT: 1 Visit         Silvano Currier, PT  Acute Rehabilitation Services Office [phone] Secure Chat welcomed   Silvano VEAR Currier 01/31/2024, 5:05 PM

## 2024-01-31 NOTE — Evaluation (Signed)
 Occupational Therapy Evaluation Patient Details Name: Melinda [name] MRN: [id number] DOB: 1966/02/29 Today's Date: 01/31/2024   History of Present Illness   Pt is a 58 yo female presenting to [facility] on 01/30/24 for dysarthria and BLE weakness resulting in a fall. 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 PMH of diabetes mellitus type 2, sleep apnea, depression, hypertension, history of PE, asthma, hypothyroidism.     Clinical Impressions Pt admitted for above, PTA pt lived and reports being ind with ADLs/iADLs, has lots of DME at home. Pt demonstrating limited standing/activity tolerance and generalized weakness. Pt ambulating with CGA + bari Rollator, completing ADLs with CGA to setup A. Educated pt on energy conservation strategies, would benefit from continued reinforcement. OT to continue to progress pt as able. Patient would benefit from post acute Home OT services to help maximize functional independence in natural environment      If plan is discharge home, recommend the following:   Assistance with cooking/housework     Functional Status Assessment   Patient has had a recent decline in their functional status and demonstrates the ability to make significant improvements in function in a reasonable and predictable amount of time.     Equipment Recommendations   None recommended by OT (pt has rec DME)     Recommendations for Other Services         Precautions/Restrictions   Precautions Precautions: Fall Recall of Precautions/Restrictions: Intact Restrictions Weight Bearing Restrictions Per Provider Order: No     Mobility Bed Mobility Overal bed mobility: Needs Assistance Bed Mobility: Sit to Supine       Sit to supine: Modified independent (Device/Increase time), Used rails   General bed mobility comments: Pt rec'd OOB    Transfers Overall transfer level: Needs assistance Equipment used:  None Transfers: Sit to/from Stand Sit to Stand: Contact guard assist                  Balance Overall balance assessment: Needs assistance Sitting-balance support: No upper extremity supported, Feet supported Sitting balance-Leahy Scale: Good     Standing balance support: During functional activity, Reliant on assistive device for balance Standing balance-Leahy Scale: Fair                             ADL either performed or assessed with clinical judgement   ADL Overall ADL's : Needs assistance/impaired Eating/Feeding: Independent;Sitting   Grooming: Sitting;Set up   Upper Body Bathing: Sitting;Set up   Lower Body Bathing: Set up;Sitting/lateral leans   Upper Body Dressing : Set up;Sitting   Lower Body Dressing: Contact guard assist;Sit to/from stand Lower Body Dressing Details (indicate cue type and reason): able to doff.don socks using figure four position. Toilet Transfer: Tree surgeon (4 wheels);Requires wide/bariatric   Toileting- Clothing Manipulation and Hygiene: Contact guard assist;Sit to/from stand       Functional mobility during ADLs: Contact guard assist;Rollator (4 wheels) General ADL Comments: Edcuated pt on energy conservation strategies.     Vision   Vision Assessment?: No apparent visual deficits     Perception         Praxis         Pertinent Vitals/Pain Pain Assessment Pain Assessment: Faces Faces Pain Scale: Hurts little more Pain Location: Low back pain; onset of pain after bout of ambulation Pain Descriptors / Indicators: Aching Pain Intervention(s): Monitored during session, Repositioned  Extremity/Trunk Assessment Upper Extremity Assessment Upper Extremity Assessment: Generalized weakness   Lower Extremity Assessment Lower Extremity Assessment: Generalized weakness (LLE weaker than RLE per pt report)       Communication Communication Communication: No apparent difficulties    Cognition Arousal: Alert Behavior During Therapy: WFL for tasks assessed/performed Cognition: No apparent impairments                               Following commands: Intact       Cueing  General Comments   Cueing Techniques: Verbal cues  VSS RA.   Exercises     Shoulder Instructions      Home Living Family/patient expects to be discharged to:: Private residence Living Arrangements: Alone Available Help at Discharge: Other (Comment) (Personal care attentdant comes Tues and Thursdays; helps with household tasks) Type of Home: House Home Access: Stairs to enter [employer] of Steps: 6-7 Entrance Stairs-Rails: Can reach both;Right;Left Home Layout: One level     Bathroom Shower/Tub: Producer, television/film/video: Handicapped height     Home Equipment: Shower seat;Grab bars - tub/shower;Grab bars - toilet;Rollator (4 wheels);Hand held shower head;Cane - single point   Additional Comments: larger Rollator. 1 fall yesterday.      Prior Functioning/Environment Prior Level of Function : Independent/Modified Independent;History of Falls (last six months)             Mobility Comments: mod i Rollator ADLs Comments: ind    OT Problem List: Impaired balance (sitting and/or standing);Decreased activity tolerance;Decreased strength   OT Treatment/Interventions: Therapeutic exercise;Therapeutic activities;Self-care/ADL training;Patient/family education;DME and/or AE instruction;Energy conservation;Balance training      OT Goals(Current goals can be found in the care plan section)   Acute Rehab OT Goals Patient Stated Goal: To go home OT Goal Formulation: With patient Time For Goal Achievement: 02/14/24 Potential to Achieve Goals: Good ADL Goals Pt Will Perform Grooming: with modified independence;standing Pt Will Perform Lower Body Dressing: with modified independence;sit to/from stand Pt Will Transfer to Toilet: with modified  independence;ambulating Additional ADL Goal #1: Pt will demonstrate ind with energy conservation strategies to complete functional ADLs/iADLs. Additional ADL Goal #2: Pt will complee >6 mins OOB standing activity to demonstrate increasing activity tolerance.   OT Frequency:  Min 2X/week    Co-evaluation              AM-PAC OT 6 Clicks Daily Activity     Outcome Measure Help from another person eating meals?: None Help from another person taking care of personal grooming?: A Little Help from another person toileting, which includes using toliet, bedpan, or urinal?: A Little Help from another person bathing (including washing, rinsing, drying)?: A Little Help from another person to put on and taking off regular upper body clothing?: A Little Help from another person to put on and taking off regular lower body clothing?: A Little 6 Click Score: 19   End of Session Equipment Utilized During Treatment: Gait belt;Rollator (4 wheels) Nurse Communication: Mobility status  Activity Tolerance: Patient tolerated treatment well Patient left: in bed;with call bell/phone within reach;with bed alarm set  OT Visit Diagnosis: Unsteadiness on feet (R26.81);Muscle weakness (generalized) (M62.81)                Time: 8883-8872 OT Time Calculation (min): 11 min Charges:  OT General Charges $OT Visit: 1 Visit OT Evaluation $OT Eval Low Complexity: 1 Low  01/31/2024  AB, OTR/L  Acute  Rehabilitation Services  Office: [phone]   Melinda [name] 01/31/2024, 12:55 PM

## 2024-02-01 DIAGNOSIS — R29898 Other symptoms and signs involving the musculoskeletal system: Secondary | ICD-10-CM | POA: Diagnosis not present

## 2024-02-01 LAB — GLUCOSE, CAPILLARY
Glucose-Capillary: 76 mg/dL (ref 70–99)
Glucose-Capillary: 132 mg/dL — ABNORMAL HIGH (ref 70–99)

## 2024-02-01 NOTE — Discharge Summary (Signed)
 Physician Discharge Summary  Melinda [name] FMW:[id number] DOB: Feb 22, 1966 DOA: 01/30/2024  PCP: Cleotilde, [place]  E, PA  Admit date: 01/30/2024 Discharge date: 02/01/2024  Admitted From: home Disposition:  home  Recommendations for Outpatient Follow-up:  Follow up with PCP in 1-2 weeks Follow up with Dr Ether in 1-2 weeks  Home Health: PT Equipment/Devices: walker, shower stool  Discharge Condition: stable CODE STATUS: Full code Diet Orders (From admission, onward)     Start     Ordered   01/31/24 0341  Diet heart healthy/carb modified Room service appropriate? Yes; Fluid consistency: Thin  Diet effective now       Question Answer Comment  Diet-HS Snack? Nothing   Room service appropriate? Yes   Fluid consistency: Thin      01/31/24 0341            Brief Narrative / Interim history: 58 y.o. female with history of diabetes mellitus type 2, sleep apnea, depression, hypertension, history of PE, asthma, hypothyroidism postoperative was brought to the ER after patient had a fall at home after she woke up from sleep and was trying to get out of the bed.  She fell onto her buttocks and hurt.  Following which she had some dysarthria and weakness of the extremities.  Patient was brought in as a code stroke.   Hospital Course / Discharge diagnoses: Principal Problem:   Lower extremity weakness Active Problems:   Migraine   Morbid obesity (HCC)   Mild persistent asthma   History of pulmonary embolism   Controlled type 2 diabetes mellitus with complication, with long-term current use of insulin  (HCC)   S/P thyroidectomy   Primary hypertension   Postoperative hypothyroidism   Type 2 diabetes mellitus with hyperglycemia (HCC)   Cervical radiculopathy   Principal problem Bilateral lower extremity weakness -  with C-spine showing moderate to severe canal stenosis at C5-6. Neurosurgery consulted and evaluated patient while hospitalized, given that her weakness recovered there is  no need for acute surgery, and it was recommended that she follows up as an outpatient with Ortho/spine, Dr. Lucilla who she has previously seen. She worked with physical therapy, stable to go home with home health, walker   Active problems Diabetes mellitus type 2 - takes Ozempic at home.  A1c 6.1 Hypertension -continue home medications History of PE -continue Eliquis  Postoperative hypothyroidism - on Synthroid . Hyperlipidemia -continue statin. Depression - on Vraylar  and Wellbutrin .  BuSpar  for anxiety. OSA - on CPAP at bedtime. History of migraine - on Topamax  for prophylaxis and Aimovig . Asthma - on Breo Singulair . Chronic pain - on gabapentin .  Confirmed on PDMP website.  Sepsis ruled out   Discharge Instructions   Allergies as of 02/01/2024       Reactions   Ceftriaxone Anaphylaxis, Other (See Comments)   ROCEPHIN   Metformin  Anaphylaxis   ALL   Penicillin G Shortness Of Breath   Penicillins Shortness Of Breath   Has patient had a PCN reaction causing immediate rash, facial/tongue/throat swelling, SOB or lightheadedness with hypotension: Yes Has patient had a PCN reaction causing severe rash involving mucus membranes or skin necrosis: No Has patient had a PCN reaction that required hospitalization No Has patient had a PCN reaction occurring within the last 10 years: No If all of the above answers are NO, then may proceed with Cephalosporin use.   Shellfish Allergy Anaphylaxis   Shellfish-derived Products Anaphylaxis   Citrus Rash   Flonase  [fluticasone  Propionate]    Makes migraines worse  Gold-containing Drug Products    HANDS ITCH   Nickel    HANDS SWELL        Medication List     TAKE these medications    Aimovig  140 MG/ML Soaj Generic drug: Erenumab -aooe Inject 140 mg into the skin every 28 (twenty-eight) days.   albuterol  (2.5 MG/3ML) 0.083% nebulizer solution Commonly known as: PROVENTIL  Take 3 mLs (2.5 mg total) by nebulization every 6 (six) hours  as needed for wheezing or shortness of breath.   albuterol  108 (90 Base) MCG/ACT inhaler Commonly known as: VENTOLIN  HFA Inhale 2 puffs into the lungs every 6 (six) hours as needed for wheezing or shortness of breath.   atorvastatin  20 MG tablet Commonly known as: LIPITOR TAKE ONE TABLET BY MOUTH ONCE DAILY AT BEDTIME   budesonide -formoterol  160-4.5 MCG/ACT inhaler Commonly known as: Symbicort  Inhale 2 puffs into the lungs 2 (two) times daily.   buPROPion  300 MG 24 hr tablet Commonly known as: WELLBUTRIN  XL Take 300 mg by mouth daily. What changed: additional instructions   Wellbutrin  XL 150 MG 24 hr tablet Generic drug: buPROPion  Take 150 mg by mouth every morning. Take along with 300mg  for a total daily dose of 450mg  What changed: Another medication with the same name was changed. Make sure you understand how and when to take each.   busPIRone  15 MG tablet Commonly known as: BUSPAR  Take 15 mg by mouth 2 (two) times daily.   cyclobenzaprine  10 MG tablet Commonly known as: FLEXERIL  Take 10 mg by mouth 3 (three) times daily as needed.   doxepin  100 MG capsule Commonly known as: SINEQUAN  Take 100 mg by mouth at bedtime.   Eliquis  5 MG Tabs tablet Generic drug: apixaban  TAKE ONE TABLET BY MOUTH TWICE DAILY MORNING AND EVENING   gabapentin  300 MG capsule Commonly known as: NEURONTIN  TAKE ONE CAPSULE BY MOUTH THREE TIMES DAILY (AM+NOON+BEDTIME) What changed: See the new instructions.   ketoconazole  2 % cream Commonly known as: NIZORAL  Apply 1 Application topically daily. Apply to affected areas on face twice a day for 3 weeks after using nystatin -triamcinolone  for 1 week. What changed:  when to take this reasons to take this additional instructions   levothyroxine  300 MCG tablet Commonly known as: SYNTHROID  Take 150-300 mcg by mouth See admin instructions. Take 1 tablet by mouth daily for 6 days, then take 1/2 tablet on the 7th day   loratadine  10 MG  tablet Commonly known as: CLARITIN  TAKE 1 TABLET (10 MG TOTAL) BY MOUTH DAILY (AM)   metoprolol  tartrate 50 MG tablet Commonly known as: LOPRESSOR  TAKE ONE TABLET BY MOUTH TWICE DAILY (AM+BEDTIME) What changed: See the new instructions.   montelukast  10 MG tablet Commonly known as: SINGULAIR  TAKE 1 TABLET BY MOUTH EVERY EVENING AT BEDTIME   oxybutynin  5 MG 24 hr tablet Commonly known as: DITROPAN -XL Take 5 mg by mouth at bedtime.   prazosin 5 MG capsule Commonly known as: MINIPRESS Take 5 mg by mouth at bedtime.   Semaglutide (1 MG/DOSE) 2 MG/1.5ML Sopn Inject 2 mg into the skin once a week.   topiramate  200 MG tablet Commonly known as: TOPAMAX  TAKE 1 TABLET (200 MG TOTAL) BY MOUTH AT BEDTIME.   Vitamin D3 10 MCG (400 UNIT) Caps TAKE ONE CAPSULE BY MOUTH ONCE DAILY (AM)   Vraylar  3 MG capsule Generic drug: cariprazine  Take 3 mg by mouth daily.        Follow-up Information     Lucilla Lynwood BRAVO, MD Follow up  in 1 week(s).   Specialty: Orthopedic Surgery        Cleotilde, [place]  E, PA Follow up in 1 week(s).   Specialty: Internal Medicine Contact information: [address] [place] [address] [phone]                 Consultations: Neurology Neurosurgery  Procedures/Studies:  DG Pelvis 1-2 Views Result Date: 01/31/2024 EXAM: 1 or 2 VIEW(S) XRAY OF THE PELVIS 01/31/2024 08:10:00 AM COMPARISON: CT AP 03/29/2023 CLINICAL HISTORY: 809823 Fall 809823. Fall; Pt fell yesterday s/t legs giving out. 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 ; Best image obtainable d/t body habitus. Fall; Pt fell yesterday s/t legs giving out. 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 No focal osseous lesion. No joint dislocation. Degenerative changes noted within the imaged portions of the lower lumbar spine. SOFT  TISSUES: The soft tissues are unremarkable. IMPRESSION: 1. No acute findings. Electronically signed by: Waddell Calk MD 01/31/2024 08:28 AM EDT RP Workstation: [id number]   MR LUMBAR SPINE WO CONTRAST Result Date: 01/30/2024 EXAM: MRI LUMBAR SPINE 01/30/2024 06:30:41 PM TECHNIQUE: Multiplanar multisequence MRI of the lumbar spine was performed without the administration of intravenous contrast. COMPARISON: MRI of the lumbar spine dated 07/17/2021. CLINICAL HISTORY: Ataxia, lumbar trauma. Head trauma \\T \ C, T, L spine ataxia. Melinda [name] is a 58 y.o. female arriving to [facility] via [facility] on 01/30/2024 with past medical hx significant of migraine, hypertension and non ruptured aneurysm. On Eliquis  (apixaban ) daily. Code stroke was activated by EMS. FINDINGS: BONES AND ALIGNMENT: Normal alignment. Normal vertebral body heights. Bone marrow signal is unremarkable. SPINAL CORD: The conus terminates normally. SOFT TISSUES: No paraspinal mass. L1-L2: Mild central disc bulging but no significant spinal canal or neural foraminal stenosis. L2-L3: No significant disc herniation. No spinal canal stenosis or neural foraminal narrowing. L3-L4: No significant disc herniation. No spinal canal stenosis or neural foraminal narrowing. L4-L5: Mild diffuse disc bulging with mild central spinal canal stenosis and lateral recess stenosis, but no nerve root impingement. L5-S1: Right paracentral bulging disc osteophyte complex causing mild-to-moderate central spinal canal stenosis. The neural foramina are patent. There is no apparent nerve root impingement. IMPRESSION: 1. Mild central disc bulging at L1-2 without significant spinal canal or neural foraminal stenosis. 2. Mild diffuse disc bulging at L4-5 with mild central spinal canal stenosis and lateral recess stenosis, without nerve root impingement. 3. Right paracentral bulging disc osteophyte complex at L5-S1 causing mild-to-moderate central spinal canal stenosis, without  nerve root impingement. Neural foramina are patent. Electronically signed by: evalene coho 01/30/2024 06:51 PM EDT RP Workstation: [id number]   MR THORACIC SPINE WO CONTRAST Result Date: 01/30/2024 EXAM: MRI THORACIC SPINE WITHOUT INTRAVENOUS CONTRAST 01/30/2024 06:30:41 PM TECHNIQUE: Multiplanar multisequence MRI of the thoracic spine was performed without the administration of intravenous contrast. COMPARISON: None available. CLINICAL HISTORY: Ataxia, thoracic trauma. Head trauma \\T \ C, T, L spine ataxia. Melinda [name] is a 58 y.o. female arriving to [facility] via [facility] on 01/30/2024 with past medical hx significant of migraine, hypertension and non ruptured aneurysm. On Eliquis  (apixaban ) daily. Code stroke was activated by EMS. Uses a cane baseline. FINDINGS: BONES AND ALIGNMENT: Normal alignment. Normal vertebral body heights. There is a hemangioma within the T7 vertebral body. No abnormal enhancement. SPINAL CORD: Normal spinal cord volume. Normal spinal  cord signal. SOFT TISSUES: Unremarkable. DEGENERATIVE CHANGES: There is a left posterolateral disc bulge at T6-7 which is causing mild-to-moderate left-sided spinal canal stenosis. The neural foramina are patent. There is minimal disc bulging at T1-2 and T2-3, without significant spinal canal stenosis. IMPRESSION: 1. Left posterolateral disc bulge at T6-7 causing mild-to-moderate left-sided spinal canal stenosis. Neural foramina are patent. 2. Minimal disc bulging at T1-2 and T2-3 without significant spinal canal stenosis. Electronically signed by: evalene coho 01/30/2024 06:48 PM EDT RP Workstation: [id number]   MR Cervical Spine Wo Contrast Result Date: 01/30/2024 EXAM: MRI CERVICAL SPINE WITHOUT CONTRAST 01/30/2024 06:30:41 PM TECHNIQUE: Multiplanar multisequence MRI of the cervical spine was performed without the administration of intravenous contrast. COMPARISON: MRI of the cervical spine dated 07/17/2021. CLINICAL HISTORY: Ataxia,  cervical trauma. Head trauma \\T \ C, T, L spine ataxia. Melinda [name] is a 58 y.o. female arriving to [facility] via [facility] on 01/30/2024 with past medical hx significant of migraine, hypertension and non ruptured aneurysm. On Eliquis  (apixaban ) daily. Code stroke was activated by EMS. FINDINGS: BONES AND ALIGNMENT: Straightening of the normal cervical lordosis. SPINAL CORD: The spinal cord is focally indented at the C5-6 level, but normal in signal intensity. C3-C4: There is a broad-based disc bulge, which is eccentric to the left, causing mild-to-moderate central spinal canal stenosis and mild left neural foraminal stenosis. C4-C5: There is a broad-based disc bulge and there is bilateral uncovertebral joint hypertrophy, causing mild-to-moderate central spinal canal stenosis and moderate left neural foraminal stenosis. C5-C6: There is broad-based disc bulging and bilateral uncovertebral joint-type hypertrophy. The bulging disc osteophyte complex indents the ventral surface of the spinal cord and causes moderate-to-severe central spinal canal stenosis and moderate-to-severe left neural foraminal stenosis. There is also mild-to-moderate right neural foraminal stenosis. C6-C7: There is a broad-based disc bulge causing mild to moderate central spinal canal stenosis and mild bilateral neuroforaminal stenosis. IMPRESSION: 1. Straightening of the normal cervical lordosis. 2. At C3-4, broad-based disc bulge eccentric to the left causing mild-to-moderate central spinal canal stenosis and mild left neural foraminal stenosis. 3. At C4-5, broad-based disc bulge and bilateral uncovertebral joint hypertrophy causing mild-to-moderate central spinal canal stenosis and moderate left neural foraminal stenosis. 4. At C5-6, broad-based disc bulging and bilateral uncovertebral joint-type hypertrophy causing moderate-to-severe central spinal canal stenosis, moderate-to-severe left neural foraminal stenosis, and mild-to-moderate right  neural foraminal stenosis. The bulging disc osteophyte complex indents the ventral surface of the spinal cord, which is normal in signal intensity. 5. At C6-7, broad-based disc bulge causing mild to moderate central spinal canal stenosis and mild bilateral neuroforaminal stenosis. Electronically signed by: evalene coho 01/30/2024 06:41 PM EDT RP Workstation: [id number]   MR BRAIN WO CONTRAST Result Date: 01/30/2024 EXAM: MRI BRAIN WITHOUT CONTRAST 01/30/2024 06:30:41 PM TECHNIQUE: Multiplanar multisequence MRI of the head/brain was performed without the administration of intravenous contrast. COMPARISON: CT of the head and CT angiogram of the head dated 01/30/2024. CLINICAL HISTORY: Head trauma, abnormal mental status (Age 83-64y). Head trauma \\T \ C, T, L spine ataxia. Melinda [name] is a 58 y.o. female arriving to [facility] via [facility] on 01/30/2024 with past medical hx significant of migraine, hypertension and non ruptured aneurysm. On Eliquis  (apixaban ) daily. Code stroke was activated by EMS. FINDINGS: BRAIN AND VENTRICLES: No acute infarct. No intracranial hemorrhage. No mass. No midline shift. No hydrocephalus. The sella is unremarkable. Normal flow voids. Mild cerebral white matter disease. ORBITS: No acute abnormality. SINUSES AND MASTOIDS: Mild mucosal disease within the right  maxillary sinus. No acute abnormality. BONES AND SOFT TISSUES: Normal marrow signal. No acute soft tissue abnormality. IMPRESSION: 1. No acute intracranial abnormality. 2. Mild cerebral white matter disease. 3. Mild mucosal disease within the right maxillary sinus. Electronically signed by: evalene coho 01/30/2024 06:37 PM EDT RP Workstation: [id number]   CT ANGIO HEAD NECK W WO CM (CODE STROKE) Result Date: 01/30/2024 CLINICAL DATA:  Neuro deficit, acute, stroke suspected. EXAM: CT ANGIOGRAPHY HEAD AND NECK WITH AND WITHOUT CONTRAST TECHNIQUE: Multidetector CT imaging of the head and neck was performed using the  standard protocol during bolus administration of intravenous contrast. Multiplanar CT image reconstructions and MIPs were obtained to evaluate the vascular anatomy. Carotid stenosis measurements (when applicable) are obtained utilizing NASCET criteria, using the distal internal carotid diameter as the denominator. RADIATION DOSE REDUCTION: This exam was performed according to the departmental dose-optimization program which includes automated exposure control, adjustment of the mA and/or kV according to patient size and/or use of iterative reconstruction technique. CONTRAST:  75mL OMNIPAQUE  IOHEXOL  350 MG/ML SOLN COMPARISON:  CT angiogram head/neck 05/11/2021. FINDINGS: CTA NECK FINDINGS Aortic arch: Standard aortic branching. Atherosclerotic plaque within the aortic arch. No hemodynamically significant innominate or proximal subclavian artery stenosis. Right carotid system: CCA and ICA patent within the neck without stenosis or significant atherosclerotic disease. Partially retropharyngeal course of the cervical ICA. Left carotid system: CCA and ICA patent within the neck without stenosis or significant atherosclerotic disease. Vertebral arteries: Codominant and patent within the neck without stenosis or significant atherosclerotic disease. Skeleton: The patient is edentulous. Cervical spondylosis. No acute fracture or aggressive osseous lesion. Other neck: Prior thyroidectomy. No mass or lymphadenopathy identified within the neck. Upper chest: No acute finding. Review of the MIP images confirms the above findings CTA HEAD FINDINGS Anterior circulation: The intracranial internal carotid arteries are patent. Atherosclerotic plaque within both vessels with no more than mild stenosis. The M1 middle cerebral arteries are patent. No M2 proximal branch occlusion or high-grade proximal stenosis. The anterior cerebral arteries are patent. No intracranial aneurysm is identified. Posterior circulation: The intracranial  vertebral arteries are patent. The basilar artery is patent. The posterior cerebral arteries are patent. Tortuosity of the proximal right posterior cerebral artery with small focal dilation as the vessel turns, unchanged form the CTA of 05/11/2021 (for instance as seen on series 10, image 20). Posterior communicating arteries are diminutive or absent, bilaterally. Venous sinuses: Within the limitations of contrast timing, no convincing thrombus. Anatomic variants: As described. Review of the MIP images confirms the above findings No emergent large vessel occlusion identified. These results were communicated to Rocky Likes, NP at 4:45 pmon 8/1/2025by text page via the [facility] messaging system. IMPRESSION: CTA neck: 1. The common carotid, internal carotid and vertebral arteries are patent within the neck without stenosis or significant atherosclerotic disease. 2. Aortic Atherosclerosis (ICD10-I70.0). CTA head: 1. No proximal intracranial large vessel occlusion or high-grade proximal arterial stenosis identified. 2. Tortuous proximal right posterior cerebral artery with focal dilation, which could potentially reflect a small aneurysm. This is unchanged from the prior CT of 05/11/2021. Electronically Signed   By: Rockey Childs D.O.   On: 01/30/2024 16:47   CT HEAD CODE STROKE WO CONTRAST Result Date: 01/30/2024 CLINICAL DATA:  Code stroke. Neuro deficit, acute, stroke suspected. EXAM: CT HEAD WITHOUT CONTRAST TECHNIQUE: Contiguous axial images were obtained from the base of the skull through the vertex without intravenous contrast. RADIATION DOSE REDUCTION: This exam was performed according to the departmental dose-optimization program which includes  automated exposure control, adjustment of the mA and/or kV according to patient size and/or use of iterative reconstruction technique. COMPARISON:  Head CT 10/11/2022. FINDINGS: Brain: No age-advanced or lobar predominant cerebral atrophy. There is no acute intracranial  hemorrhage. No demarcated cortical infarct. No extra-axial fluid collection. No evidence of an intracranial mass. No midline shift. Vascular: No hyperdense vessel.  Atherosclerotic calcifications. Skull: No calvarial fracture or aggressive osseous lesion. Sinuses/Orbits: No mass or acute finding within the imaged orbits. No significant paranasal sinus disease at the imaged levels. ASPECTS [facility] Stroke Program Early CT Score) - Ganglionic level infarction (caudate, lentiform nuclei, internal capsule, insula, M1-M3 cortex): 7 - Supraganglionic infarction (M4-M6 cortex): 3 Total score (0-10 with 10 being normal): 10 No evidence of an acute intracranial abnormality. These results were communicated to NP Rocky Likes, NP at 4:13 pmon 8/1/2025by text page via the [facility] messaging system. IMPRESSION: No evidence of an acute intracranial abnormality. Electronically Signed   By: Rockey Childs D.O.   On: 01/30/2024 16:13     Subjective: - no chest pain, shortness of breath, no abdominal pain, nausea or vomiting.   Discharge Exam: BP 107/81 (BP Location: Right Arm)   Pulse 85   Temp 98.1 F (36.7 C) (Oral)   Resp 18   Ht 5' 9 (1.753 m)   Wt 136 kg   SpO2 100%   BMI 44.28 kg/m   General: Pt is alert, awake, not in acute distress Cardiovascular: RRR, S1/S2 +, no rubs, no gallops Respiratory: CTA bilaterally, no wheezing, no rhonchi Abdominal: Soft, NT, ND, bowel sounds + Extremities: no edema, no cyanosis    The results of significant diagnostics from this hospitalization (including imaging, microbiology, ancillary and laboratory) are listed below for reference.     Microbiology: No results found for this or any previous visit (from the past 240 hours).   Labs: Basic Metabolic Panel: Recent Labs  Lab 01/30/24 1552 01/30/24 1555 01/31/24 0427  NA 146* 141 142  K 3.7 3.8 3.4*  CL 113* 115* 113*  CO2  --  21* 23  GLUCOSE 163* 161* 96  BUN 15 13 12   CREATININE 1.10* 1.17* 0.92  CALCIUM    --  8.7* 8.6*   Liver Function Tests: Recent Labs  Lab 01/30/24 1555  AST 27  ALT 47*  ALKPHOS 97  BILITOT 0.4  PROT 6.3*  ALBUMIN 3.3*   CBC: Recent Labs  Lab 01/30/24 1552 01/30/24 1555 01/31/24 0427  WBC  --  8.8 8.0  NEUTROABS  --  6.5 4.0  HGB 13.3 13.4 12.6  HCT 39.0 41.8 39.2  MCV  --  93.1 91.8  PLT  --  227 210   CBG: Recent Labs  Lab 01/31/24 0618 01/31/24 1206 01/31/24 1542 01/31/24 2148 02/01/24 0606  GLUCAP 90 129* 126* 136* 76   Hgb A1c No results for input(s): HGBA1C in the last 72 hours. Lipid Profile No results for input(s): CHOL, HDL, LDLCALC, TRIG, CHOLHDL, LDLDIRECT in the last 72 hours. Thyroid  function studies No results for input(s): TSH, T4TOTAL, T3FREE, THYROIDAB in the last 72 hours.  Invalid input(s): FREET3 Urinalysis    Component Value Date/Time   COLORURINE AMBER (A) 03/29/2023 0627   APPEARANCEUR CLOUDY (A) 03/29/2023 0627   LABSPEC 1.017 03/29/2023 0627   PHURINE 5.0 03/29/2023 0627   GLUCOSEU NEGATIVE 03/29/2023 0627   HGBUR LARGE (A) 03/29/2023 0627   BILIRUBINUR NEGATIVE 03/29/2023 0627   BILIRUBINUR small (A) 12/24/2021 1052   BILIRUBINUR neg 08/25/2015 1652  KETONESUR NEGATIVE 03/29/2023 0627   PROTEINUR 30 (A) 03/29/2023 0627   UROBILINOGEN 1.0 12/24/2021 1052   UROBILINOGEN 0.2 11/09/2014 1254   NITRITE NEGATIVE 03/29/2023 0627   LEUKOCYTESUR TRACE (A) 03/29/2023 0627    FURTHER DISCHARGE INSTRUCTIONS:   Get Medicines reviewed and adjusted: Please take all your medications with you for your next visit with your Primary MD   Laboratory/radiological data: Please request your Primary MD to go over all hospital tests and procedure/radiological results at the follow up, please ask your Primary MD to get all Hospital records sent to his/her office.   In some cases, they will be blood work, cultures and biopsy results pending at the time of your discharge. Please request that your primary  care M.D. goes through all the records of your hospital data and follows up on these results.   Also Note the following: If you experience worsening of your admission symptoms, develop shortness of breath, life threatening emergency, suicidal or homicidal thoughts you must seek medical attention immediately by calling 911 or calling your MD immediately  if symptoms less severe.   You must read complete instructions/literature along with all the possible adverse reactions/side effects for all the Medicines you take and that have been prescribed to you. Take any new Medicines after you have completely understood and accpet all the possible adverse reactions/side effects.    Do not drive when taking Pain medications or sleeping medications (Benzodaizepines)   Do not take more than prescribed Pain, Sleep and Anxiety Medications. It is not advisable to combine anxiety,sleep and pain medications without talking with your primary care practitioner   Special Instructions: If you have smoked or chewed Tobacco  in the last 2 yrs please stop smoking, stop any regular Alcohol  and or any Recreational drug use.   Wear Seat belts while driving.   Please note: You were cared for by a hospitalist during your hospital stay. Once you are discharged, your primary care physician will handle any further medical issues. Please note that NO REFILLS for any discharge medications will be authorized once you are discharged, as it is imperative that you return to your primary care physician (or establish a relationship with a primary care physician if you do not have one) for your post hospital discharge needs so that they can reassess your need for medications and monitor your lab values.  Time coordinating discharge: 35 minutes  SIGNED:  Nilda Fendt, MD, PhD 02/01/2024, 11:06 AM

## 2024-02-01 NOTE — Progress Notes (Signed)
 Physical Therapy Treatment Patient Details Name: Melinda [name] MRN: [id number] DOB: August 10, 1965 Today's Date: 02/01/2024   History of Present Illness 58 y.o. female admitted 01/30/24 with dysarthria, BLE weakness, fall. Head CT and MRI negative for acute injury; mod-severe stenosis C5-6. PMH includes DM2, OSA, HTN, PE, asthma, depression.   PT Comments  Pt progressing well with mobility. Today's session focused on stair training; pt able to ascend/descend stairs with rail support at supervision-level. Pt notes improving LE strength and confidence with mobility. Pt preparing for d/c home today; all education reviewed, pt reports no further questions or concerns. If to remain admitted, will continue to follow acutely to address established goals.     If plan is discharge home, recommend the following: Assistance with cooking/housework;Assist for transportation   Can travel by private vehicle      Yes  Equipment Recommendations  None recommended by PT    Recommendations for Other Services       Precautions / Restrictions Precautions Precautions: Fall Recall of Precautions/Restrictions: Intact Restrictions Weight Bearing Restrictions Per Provider Order: No     Mobility  Bed Mobility Overal bed mobility: Modified Independent Bed Mobility: Supine to Sit, Sit to Supine                Transfers Overall transfer level: Modified independent Equipment used: None Transfers: Sit to/from Stand                  Ambulation/Gait Ambulation/Gait assistance: Contact guard assist Gait Distance (Feet): 8 Feet Assistive device: None Gait Pattern/deviations: Step-through pattern, Decreased stride length, Wide base of support Gait velocity: Decreased     General Gait Details: slow, guarded gait without DME walking from bed to transport chair and transport chair<>stairs, CGA for safety; pt able to perform direction changes and turns without overt instability or LOB. further distance  deferred as pt just finished walking in hallway with RN, session focused on stair training   Stairs Stairs: Yes Stairs assistance: Supervision Stair Management: Two rails, Step to pattern, Forwards Number of Stairs: 4 General stair comments: ascend/descend 2 steps (x2 trials) with BUE rail support, supervision for safety; good carryover of education on correct technique; pt declines additional trials   Wheelchair Mobility     Tilt Bed    Modified Rankin (Stroke Patients Only)       Balance Overall balance assessment: Needs assistance Sitting-balance support: No upper extremity supported, Feet supported Sitting balance-Leahy Scale: Good     Standing balance support: During functional activity, No upper extremity supported Standing balance-Leahy Scale: Fair Standing balance comment: can stand and take steps without DME; static and dynamic stability improved with UE support                            Communication Communication Communication: No apparent difficulties  Cognition Arousal: Alert Behavior During Therapy: WFL for tasks assessed/performed   PT - Cognitive impairments: No apparent impairments                         Following commands: Intact      Cueing    Exercises      General Comments General comments (skin integrity, edema, etc.): pt preparing for d/c home today, reviewed educ re: activity recommendations, therex/HEP, fall risk reduction, DME use, importance of mobility. pt reports no further questions or concerns; will need transportation home, RN and MD aware  Pertinent Vitals/Pain Pain Assessment Pain Assessment: No/denies pain Pain Intervention(s): Monitored during session    Home Living                          Prior Function            PT Goals (current goals can now be found in the care plan section) Progress towards PT goals: Progressing toward goals    Frequency    Min 2X/week      PT  Plan      Co-evaluation              AM-PAC PT 6 Clicks Mobility   Outcome Measure  Help needed turning from your back to your side while in a flat bed without using bedrails?: None Help needed moving from lying on your back to sitting on the side of a flat bed without using bedrails?: None Help needed moving to and from a bed to a chair (including a wheelchair)?: A Little Help needed standing up from a chair using your arms (e.g., wheelchair or bedside chair)?: None Help needed to walk in hospital room?: A Little Help needed climbing 3-5 steps with a railing? : A Little 6 Click Score: 21    End of Session   Activity Tolerance: Patient tolerated treatment well Patient left: in bed;with call bell/phone within reach Nurse Communication: Mobility status PT Visit Diagnosis: Unsteadiness on feet (R26.81);Repeated falls (R29.6);Muscle weakness (generalized) (M62.81)     Time: 1040-1055 PT Time Calculation (min) (ACUTE ONLY): 15 min  Charges:    $Gait Training: 8-22 mins PT General Charges $$ ACUTE PT VISIT: 1 Visit                      Darice Almas, PT, DPT Acute Rehabilitation Services  Personal: Secure Chat Rehab Office: [phone]  Darice LITTIE Almas 02/01/2024, 12:17 PM

## 2024-02-01 NOTE — Plan of Care (Signed)
  Problem: Education: Goal: Knowledge of General Education information will improve Description: Including pain rating scale, medication(s)/side effects and non-pharmacologic comfort measures 02/01/2024 1356 by Florian Molder, RN Outcome: Adequate for Discharge 02/01/2024 1356 by Florian Molder, RN Outcome: Progressing   Problem: Health Behavior/Discharge Planning: Goal: Ability to manage health-related needs will improve 02/01/2024 1356 by Florian Molder, RN Outcome: Adequate for Discharge 02/01/2024 1356 by Florian Molder, RN Outcome: Progressing

## 2024-02-02 DIAGNOSIS — R269 Unspecified abnormalities of gait and mobility: Secondary | ICD-10-CM | POA: Diagnosis not present

## 2024-02-02 LAB — HEMOGLOBIN A1C
Hgb A1c MFr Bld: 6.7 % — ABNORMAL HIGH (ref 4.8–5.6)
Mean Plasma Glucose: 146 mg/dL

## 2024-02-03 ENCOUNTER — Encounter: Payer: Self-pay | Admitting: Podiatry

## 2024-02-03 ENCOUNTER — Ambulatory Visit: Admitting: Podiatry

## 2024-02-03 DIAGNOSIS — E119 Type 2 diabetes mellitus without complications: Secondary | ICD-10-CM

## 2024-02-03 DIAGNOSIS — M79674 Pain in right toe(s): Secondary | ICD-10-CM | POA: Diagnosis not present

## 2024-02-03 DIAGNOSIS — R269 Unspecified abnormalities of gait and mobility: Secondary | ICD-10-CM | POA: Diagnosis not present

## 2024-02-03 DIAGNOSIS — B351 Tinea unguium: Secondary | ICD-10-CM | POA: Diagnosis not present

## 2024-02-03 DIAGNOSIS — M79675 Pain in left toe(s): Secondary | ICD-10-CM | POA: Diagnosis not present

## 2024-02-03 DIAGNOSIS — E1142 Type 2 diabetes mellitus with diabetic polyneuropathy: Secondary | ICD-10-CM | POA: Diagnosis not present

## 2024-02-03 DIAGNOSIS — M2142 Flat foot [pes planus] (acquired), left foot: Secondary | ICD-10-CM

## 2024-02-03 DIAGNOSIS — L600 Ingrowing nail: Secondary | ICD-10-CM | POA: Diagnosis not present

## 2024-02-03 DIAGNOSIS — M2141 Flat foot [pes planus] (acquired), right foot: Secondary | ICD-10-CM

## 2024-02-04 DIAGNOSIS — R269 Unspecified abnormalities of gait and mobility: Secondary | ICD-10-CM | POA: Diagnosis not present

## 2024-02-05 DIAGNOSIS — R269 Unspecified abnormalities of gait and mobility: Secondary | ICD-10-CM | POA: Diagnosis not present

## 2024-02-06 DIAGNOSIS — R269 Unspecified abnormalities of gait and mobility: Secondary | ICD-10-CM | POA: Diagnosis not present

## 2024-02-06 NOTE — Progress Notes (Signed)
 ANNUAL DIABETIC FOOT EXAM  Subjective: Melinda [name] presents today for annual diabetic foot exam. She is working with PT to strengthen her lower extremities. Chief Complaint  Patient presents with   Nail Problem    Thick painful toenails, 6 month follow up - patient was in the hospital over the weekend, she couldn't move her legs    Patient confirms h/o diabetes.  Patient denies any h/o foot wounds.  Patient has been diagnosed with neuropathy.  Melinda [name], Melinda  E, PA is patient's PCP. LOV 02/01/2024.  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79   HAD TO LEARN TO WALK AGAIN   Iron deficiency anemia due to chronic blood loss 09/09/2014   Low TSH level 09/08/2014   Lumbar herniated disc    Menorrhagia 09/08/2014   Migraine    CLUSTER AND MIGRAINES   Multiple thyroid  nodules    Ovarian mass, right 09/09/2014   PE (pulmonary embolism) 2016   Peripheral neuropathy    FINGER TIPS AND TOES NUMB SOME   Preeclampsia 1983   WITH PREGNANCY   PTSD (post-traumatic stress disorder)    Scoliosis    Seizures (HCC) AGE 9   NONE SINCE, HAD CHICKEN POX THEN   Patient Active Problem List   Diagnosis Date Noted   Lower extremity weakness 01/31/2024   Cervical radiculopathy 01/30/2024   Postoperative hypothyroidism 01/21/2022   Esophageal dysphagia 01/21/2022   History of DVT (deep vein thrombosis) 01/21/2022   History of malignant  neoplasm of thyroid  01/21/2022   Long term (current) use of anticoagulants 01/21/2022   Migraine without aura, not refractory 01/21/2022   Type 2 diabetes mellitus with hyperglycemia (HCC) 01/21/2022   Spinal stenosis excluding cervical region 11/21/2021   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   Abdominal pannus 08/21/2021   Hardening of the aorta (main artery of the heart) (HCC) 08/21/2021   At high risk for falls 04/20/2020   Hyperlipidemia 02/16/2020   Adrenal mass, right (HCC) 04/29/2019   Chronic cough 03/12/2018   Left shoulder pain 10/14/2016   Primary hypertension 10/14/2016   S/P thyroidectomy 06/27/2016   Chronic bilateral low back pain without sciatica 04/22/2016   Liver lesion 12/24/2015   Malignant tumor of thyroid  gland (HCC) 11/16/2015   Controlled type 2 diabetes mellitus with complication, with long-term current use of insulin  (HCC) 10/10/2015   Vision loss 08/25/2015   Decreased vision in both eyes 08/25/2015   Prolonged Q-T interval on ECG 08/19/2015   History of pulmonary embolism 08/10/2015   Mild persistent asthma 04/13/2015   Nonruptured cerebral aneurysm 04/13/2015   Fibroid, uterine    Morbid obesity (HCC) 11/11/2014   Atypical chest pain 11/08/2014   Sinus tachycardia 11/08/2014   Migraine 10/14/2014   Menorrhagia 09/08/2014   Past Surgical History:  Procedure Laterality Date   ANGIOGRAM TO LEG  08-13-15   RIGHT   CHOLECYSTECTOMY     IR GENERIC HISTORICAL  04/04/2016   IR RADIOLOGIST EVAL & MGMT 04/04/2016 Norleen Roulette, MD [facility] INTERV RAD   RADIOLOGY WITH ANESTHESIA N/A 07/17/2021   Procedure: MRI LUMBAR SPINE WITHOUT CONTRAST; MRI CERVICAL SPINE WITHOUT CONTRAST WITH ANESTHESIA;  Surgeon: Radiologist, Medication, MD;  Location: [facility] OR;  Service: Radiology;  Laterality: N/A;   THYROIDECTOMY N/A 11/17/2015   Procedure: TOTAL THYROIDECTOMY;  Surgeon: Krystal Spinner, MD;  Location: [facility] ORS;  Service: General;  Laterality: N/A;   UTERINE ARTERY  EMBOLIZATION Bilateral 08/13/2015   Current Outpatient Medications on File Prior to Visit  Medication Sig Dispense Refill   albuterol  (PROVENTIL ) (2.5 MG/3ML) 0.083% nebulizer solution Take 3 mLs (2.5 mg total) by nebulization every 6 (six) hours as needed for wheezing or shortness of breath. (Patient not taking: Reported on 01/30/2024) 3 mL 3   albuterol  (VENTOLIN  HFA) 108 (90 Base) MCG/ACT inhaler Inhale 2 puffs into the lungs every 6 (six) hours as needed for wheezing or shortness of breath. 8 g 2   apixaban  (ELIQUIS ) 5 MG TABS tablet TAKE ONE TABLET BY MOUTH TWICE DAILY MORNING AND EVENING 60 tablet 0   atorvastatin  (LIPITOR) 20 MG tablet TAKE ONE TABLET BY MOUTH ONCE DAILY AT BEDTIME 30 tablet 0   budesonide -formoterol  (SYMBICORT ) 160-4.5 MCG/ACT inhaler Inhale 2 puffs into the lungs 2 (two) times daily. 2 each 6   buPROPion  (WELLBUTRIN  XL) 300 MG 24 hr tablet Take 300 mg by mouth daily.  (Patient taking differently: Take 300 mg by mouth daily. Take along with 150mg  tablet)  2   busPIRone  (BUSPAR ) 15 MG tablet Take 15 mg by mouth 2 (two) times daily.     Cholecalciferol  (VITAMIN D3) 10 MCG (400 UNIT) CAPS TAKE ONE CAPSULE BY MOUTH ONCE DAILY (AM) 100 capsule 0   cyclobenzaprine  (FLEXERIL ) 10 MG tablet Take 10 mg by mouth 3 (three) times daily as needed.     doxepin  (SINEQUAN ) 100 MG capsule Take 100 mg by mouth at bedtime.     Erenumab -aooe (AIMOVIG ) 140 MG/ML SOAJ Inject 140 mg into the skin every 28 (twenty-eight) days. 1.12 mL 11   gabapentin  (NEURONTIN ) 300 MG capsule TAKE ONE CAPSULE BY MOUTH THREE TIMES DAILY (AM+NOON+BEDTIME) (Patient taking differently: Take 300 mg by mouth 3 (three) times daily. TAKE ONE CAPSULE BY MOUTH THREE TIMES DAILY (AM+NOON+BEDTIME)) 90 capsule 6   ketoconazole  (NIZORAL ) 2 % cream Apply 1 Application topically daily. Apply to affected areas on face twice a day for 3 weeks after using nystatin -triamcinolone  for 1 week. (Patient taking differently: Apply 1  Application topically daily as needed (severe dermatitis).) 30 g 0   levothyroxine  (SYNTHROID ) 300 MCG tablet Take 150-300 mcg by mouth See admin instructions. Take 1 tablet by mouth daily for 6 days, then take 1/2 tablet on the 7th day     loratadine  (CLARITIN ) 10 MG tablet TAKE 1 TABLET (10 MG TOTAL) BY MOUTH DAILY (AM) 30 tablet 0   metoprolol  tartrate (LOPRESSOR ) 50 MG tablet TAKE ONE TABLET BY MOUTH TWICE DAILY (AM+BEDTIME) (Patient taking differently: Take 75 mg by mouth 2 (two) times daily. TAKE ONE TABLET BY MOUTH TWICE DAILY (AM+BEDTIME)) 60 tablet 0   montelukast  (SINGULAIR ) 10 MG tablet TAKE 1 TABLET BY MOUTH EVERY EVENING AT BEDTIME 30 tablet 0   oxybutynin  (DITROPAN -XL) 5 MG 24 hr tablet Take 5 mg by mouth at bedtime.     prazosin (MINIPRESS) 5 MG capsule Take 5 mg by mouth  at bedtime.     Semaglutide, 1 MG/DOSE, 2 MG/1.5ML SOPN Inject 2 mg into the skin once a week.     topiramate  (TOPAMAX ) 200 MG tablet TAKE 1 TABLET (200 MG TOTAL) BY MOUTH AT BEDTIME. 90 tablet 0   VRAYLAR  3 MG capsule Take 3 mg by mouth daily.     WELLBUTRIN  XL 150 MG 24 hr tablet Take 150 mg by mouth every morning. Take along with 300mg  for a total daily dose of 450mg      No current facility-administered medications on file prior to visit.    Allergies  Allergen Reactions   Ceftriaxone Anaphylaxis and Other (See Comments)    ROCEPHIN   Metformin  Anaphylaxis    ALL   Penicillin G Shortness Of Breath   Penicillins Shortness Of Breath    Has patient had a PCN reaction causing immediate rash, facial/tongue/throat swelling, SOB or lightheadedness with hypotension: Yes Has patient had a PCN reaction causing severe rash involving mucus membranes or skin necrosis: No Has patient had a PCN reaction that required hospitalization No Has patient had a PCN reaction occurring within the last 10 years: No If all of the above answers are NO, then may proceed with Cephalosporin use.    Shellfish Allergy Anaphylaxis    Shellfish-Derived Products Anaphylaxis   Citrus Rash   Flonase  [Fluticasone  Propionate]     Makes migraines worse   Gold-Containing Drug Products     HANDS ITCH   Nickel     HANDS SWELL   Social History   Occupational History   Occupation: disabled  Tobacco Use   Smoking status: Former    Current packs/day: 0.00    Average packs/day: 0.5 packs/day for 40.0 years (20.0 ttl pk-yrs)    Types: Cigarettes    Start date: 11/03/1974    Quit date: 11/03/2014    Years since quitting: 9.2    Passive exposure: Past   Smokeless tobacco: Never  Vaping Use   Vaping status: Never Used  Substance and Sexual Activity   Alcohol use: No    Alcohol/week: 0.0 standard drinks of alcohol   Drug use: No   Sexual activity: Never    Birth control/protection: None   Family History  Problem Relation Age of Onset   Diabetes Mother    Breast cancer Mother    CAD Mother    Hypertension Mother    Alcohol abuse Father    Hypertension Father    Breast cancer Maternal Aunt    Breast cancer Maternal Aunt    Kidney nephrosis Son    Diabetes Son    Breast cancer Other 60 - 69   Immunization History  Administered Date(s) Administered   Fluzone Influenza virus vaccine,trivalent (IIV3), split virus 03/18/2016   Influenza, Seasonal, Injecte, Preservative Fre 03/26/2023   Influenza,inj,Quad PF,6+ Mos 09/09/2014, 04/13/2015, 03/18/2016, 05/06/2017, 04/28/2018, 02/24/2019, 04/20/2021, 04/15/2022   Influenza-Unspecified 03/31/2020   PFIZER(Purple Top)SARS-COV-2 Vaccination 09/24/2019, 10/15/2019, 04/15/2020   PNEUMOCOCCAL CONJUGATE-20 04/20/2021   Pfizer Covid-19 Vaccine Bivalent Booster 3yrs & up 03/18/2021   Pfizer(Comirnaty )Fall Seasonal Vaccine 12 years and older 04/19/2022   Pneumococcal Conjugate-13 09/09/2014   Pneumococcal Polysaccharide-23 09/09/2014   Pneumococcal-Unspecified 09/09/2014   Tdap 10/28/2014   Zoster Recombinant(Shingrix) 12/19/2020, 02/19/2021     Review of Systems: Negative  except as noted in the HPI.   Objective: There were no vitals filed for this visit.  SHENEIKA WALSTAD is a pleasant 58 y.o. female in NAD. AAO X 3.  Diabetic foot exam was performed with  the following findings:   Vascular Examination: Capillary refill time immediate b/l. Palpable pedal pulses. Pedal hair present b/l. Pedal edema trace b/l. No pain with calf compression b/l. Skin temperature gradient WNL b/l. No cyanosis or clubbing b/l. No ischemia or gangrene noted b/l.   Neurological Examination: Pt has subjective symptoms of neuropathy. Protective sensation intact 5/5 intact bilaterally with 10g monofilament b/l.  Dermatological Examination: Pedal skin with normal turgor, texture and tone b/l.  No open wounds. No interdigital macerations.   Toenails 2-5 b/l thick, discolored, elongated with subungual debris and pain on dorsal palpation.   No corns, calluses, nor porokeratotic lesions.  Incurvated nailplate both borders of left hallux and both borders of right hallux.  Nail border hypertrophy absent. There is tenderness to palpation. Sign(s) of infection: no clinical signs of infection noted on examination today..  Musculoskeletal Examination: Muscle strength 3/5 to all lower extremity muscle groups bilaterally. Pes planus deformity noted bilateral LE.  Radiographs: None     Lab Results  Component Value Date   HGBA1C 6.7 (H) 01/31/2024   ADA Risk Categorization: Low Risk :  Patient has all of the following: Intact protective sensation No prior foot ulcer  No severe deformity Pedal pulses present  Assessment: 1. Pain due to onychomycosis of toenails of both feet   2. Pes planus of both feet   3. Ingrown toenail without infection   4. Diabetic peripheral neuropathy associated with type 2 diabetes mellitus (HCC)   5. Encounter for diabetic foot exam (HCC)     Plan: Diabetic foot examination performed today. All patient's and/or POA's questions/concerns addressed on  today's visit. Toenails 2-5 bilaterally debrided in length and girth without incident. Monitor blood glucose per PCP/Endocrinologist's recommendations.Continue soft, supportive shoe gear daily. Report any pedal injuries to medical professional.  No invasive procedure(s) performed. Offending nail border debrided and curretaged both borders of left hallux and both borders of right hallux utilizing sterile nail nipper and currette. Border cleansed with alcohol and triple antibiotic ointment applied. No further treatment required by patient/caregiver. Call office if there are any concerns. Patient/POA to call should there be question/concern in the interim. Return in about 3 months (around 05/05/2024).  Delon LITTIE Merlin, DPM      [facility] LOCATION: [address], [place] [address]                   Office [phone]   [facility] LOCATION: [address] [place], [place] [address] Office [phone]

## 2024-02-09 DIAGNOSIS — R269 Unspecified abnormalities of gait and mobility: Secondary | ICD-10-CM | POA: Diagnosis not present

## 2024-02-10 DIAGNOSIS — R269 Unspecified abnormalities of gait and mobility: Secondary | ICD-10-CM | POA: Diagnosis not present

## 2024-02-11 ENCOUNTER — Other Ambulatory Visit (HOSPITAL_COMMUNITY): Payer: Self-pay | Admitting: Internal Medicine

## 2024-02-11 DIAGNOSIS — R269 Unspecified abnormalities of gait and mobility: Secondary | ICD-10-CM | POA: Diagnosis not present

## 2024-02-11 DIAGNOSIS — R600 Localized edema: Secondary | ICD-10-CM | POA: Diagnosis not present

## 2024-02-11 DIAGNOSIS — R0601 Orthopnea: Secondary | ICD-10-CM | POA: Diagnosis not present

## 2024-02-11 DIAGNOSIS — W19XXXD Unspecified fall, subsequent encounter: Secondary | ICD-10-CM | POA: Diagnosis not present

## 2024-02-11 DIAGNOSIS — R29898 Other symptoms and signs involving the musculoskeletal system: Secondary | ICD-10-CM | POA: Diagnosis not present

## 2024-02-11 DIAGNOSIS — R635 Abnormal weight gain: Secondary | ICD-10-CM | POA: Diagnosis not present

## 2024-02-12 DIAGNOSIS — F411 Generalized anxiety disorder: Secondary | ICD-10-CM | POA: Diagnosis not present

## 2024-02-12 DIAGNOSIS — R269 Unspecified abnormalities of gait and mobility: Secondary | ICD-10-CM | POA: Diagnosis not present

## 2024-02-13 DIAGNOSIS — R269 Unspecified abnormalities of gait and mobility: Secondary | ICD-10-CM | POA: Diagnosis not present

## 2024-02-14 DIAGNOSIS — R269 Unspecified abnormalities of gait and mobility: Secondary | ICD-10-CM | POA: Diagnosis not present

## 2024-02-16 DIAGNOSIS — F331 Major depressive disorder, recurrent, moderate: Secondary | ICD-10-CM | POA: Diagnosis not present

## 2024-02-16 DIAGNOSIS — F411 Generalized anxiety disorder: Secondary | ICD-10-CM | POA: Diagnosis not present

## 2024-02-16 DIAGNOSIS — F4310 Post-traumatic stress disorder, unspecified: Secondary | ICD-10-CM | POA: Diagnosis not present

## 2024-02-18 DIAGNOSIS — F411 Generalized anxiety disorder: Secondary | ICD-10-CM | POA: Diagnosis not present

## 2024-02-23 DIAGNOSIS — R269 Unspecified abnormalities of gait and mobility: Secondary | ICD-10-CM | POA: Diagnosis not present

## 2024-02-24 DIAGNOSIS — Z01419 Encounter for gynecological examination (general) (routine) without abnormal findings: Secondary | ICD-10-CM | POA: Diagnosis not present

## 2024-02-24 DIAGNOSIS — I10 Essential (primary) hypertension: Secondary | ICD-10-CM | POA: Diagnosis not present

## 2024-02-24 DIAGNOSIS — E1165 Type 2 diabetes mellitus with hyperglycemia: Secondary | ICD-10-CM | POA: Diagnosis not present

## 2024-02-24 DIAGNOSIS — J209 Acute bronchitis, unspecified: Secondary | ICD-10-CM | POA: Diagnosis not present

## 2024-02-24 DIAGNOSIS — E890 Postprocedural hypothyroidism: Secondary | ICD-10-CM | POA: Diagnosis not present

## 2024-02-24 DIAGNOSIS — R0601 Orthopnea: Secondary | ICD-10-CM | POA: Diagnosis not present

## 2024-02-26 DIAGNOSIS — F411 Generalized anxiety disorder: Secondary | ICD-10-CM | POA: Diagnosis not present

## 2024-03-01 DIAGNOSIS — R269 Unspecified abnormalities of gait and mobility: Secondary | ICD-10-CM | POA: Diagnosis not present

## 2024-03-02 DIAGNOSIS — R269 Unspecified abnormalities of gait and mobility: Secondary | ICD-10-CM | POA: Diagnosis not present

## 2024-03-03 DIAGNOSIS — R269 Unspecified abnormalities of gait and mobility: Secondary | ICD-10-CM | POA: Diagnosis not present

## 2024-03-03 DIAGNOSIS — F411 Generalized anxiety disorder: Secondary | ICD-10-CM | POA: Diagnosis not present

## 2024-03-04 DIAGNOSIS — R269 Unspecified abnormalities of gait and mobility: Secondary | ICD-10-CM | POA: Diagnosis not present

## 2024-03-05 DIAGNOSIS — R269 Unspecified abnormalities of gait and mobility: Secondary | ICD-10-CM | POA: Diagnosis not present

## 2024-03-08 DIAGNOSIS — R269 Unspecified abnormalities of gait and mobility: Secondary | ICD-10-CM | POA: Diagnosis not present

## 2024-03-09 DIAGNOSIS — R269 Unspecified abnormalities of gait and mobility: Secondary | ICD-10-CM | POA: Diagnosis not present

## 2024-03-10 DIAGNOSIS — E119 Type 2 diabetes mellitus without complications: Secondary | ICD-10-CM | POA: Diagnosis not present

## 2024-03-10 DIAGNOSIS — D3501 Benign neoplasm of right adrenal gland: Secondary | ICD-10-CM | POA: Diagnosis not present

## 2024-03-10 DIAGNOSIS — E270 Other adrenocortical overactivity: Secondary | ICD-10-CM | POA: Diagnosis not present

## 2024-03-10 DIAGNOSIS — E890 Postprocedural hypothyroidism: Secondary | ICD-10-CM | POA: Diagnosis not present

## 2024-03-10 DIAGNOSIS — G4733 Obstructive sleep apnea (adult) (pediatric): Secondary | ICD-10-CM | POA: Diagnosis not present

## 2024-03-10 DIAGNOSIS — Z85850 Personal history of malignant neoplasm of thyroid: Secondary | ICD-10-CM | POA: Diagnosis not present

## 2024-03-10 DIAGNOSIS — Z8639 Personal history of other endocrine, nutritional and metabolic disease: Secondary | ICD-10-CM | POA: Diagnosis not present

## 2024-03-10 DIAGNOSIS — R269 Unspecified abnormalities of gait and mobility: Secondary | ICD-10-CM | POA: Diagnosis not present

## 2024-03-11 DIAGNOSIS — R269 Unspecified abnormalities of gait and mobility: Secondary | ICD-10-CM | POA: Diagnosis not present

## 2024-03-11 DIAGNOSIS — F411 Generalized anxiety disorder: Secondary | ICD-10-CM | POA: Diagnosis not present

## 2024-03-12 DIAGNOSIS — R269 Unspecified abnormalities of gait and mobility: Secondary | ICD-10-CM | POA: Diagnosis not present

## 2024-03-15 DIAGNOSIS — R269 Unspecified abnormalities of gait and mobility: Secondary | ICD-10-CM | POA: Diagnosis not present

## 2024-03-16 DIAGNOSIS — R269 Unspecified abnormalities of gait and mobility: Secondary | ICD-10-CM | POA: Diagnosis not present

## 2024-03-17 ENCOUNTER — Ambulatory Visit (HOSPITAL_COMMUNITY)
Admission: RE | Admit: 2024-03-17 | Discharge: 2024-03-17 | Disposition: A | Discharge status: Home / Self Care | Source: Ambulatory Visit | Attending: Cardiovascular Disease | Admitting: Cardiovascular Disease

## 2024-03-17 DIAGNOSIS — R0601 Orthopnea: Secondary | ICD-10-CM | POA: Insufficient documentation

## 2024-03-17 DIAGNOSIS — R269 Unspecified abnormalities of gait and mobility: Secondary | ICD-10-CM | POA: Diagnosis not present

## 2024-03-17 LAB — ECHOCARDIOGRAM COMPLETE
Area-P 1/2: 5.42 cm2
S' Lateral: 3.3 cm

## 2024-03-24 ENCOUNTER — Other Ambulatory Visit: Payer: Self-pay

## 2024-03-24 ENCOUNTER — Other Ambulatory Visit: Payer: Self-pay | Admitting: Neurology

## 2024-03-24 ENCOUNTER — Ambulatory Visit: Admitting: Orthopedic Surgery

## 2024-03-24 ENCOUNTER — Encounter: Payer: Self-pay | Admitting: Orthopedic Surgery

## 2024-03-24 VITALS — BP 117/81 | HR 81 | Ht 70.0 in | Wt 314.0 lb

## 2024-03-24 DIAGNOSIS — M5441 Lumbago with sciatica, right side: Secondary | ICD-10-CM

## 2024-03-24 DIAGNOSIS — M5412 Radiculopathy, cervical region: Secondary | ICD-10-CM

## 2024-03-24 DIAGNOSIS — G8929 Other chronic pain: Secondary | ICD-10-CM

## 2024-03-24 DIAGNOSIS — M5442 Lumbago with sciatica, left side: Secondary | ICD-10-CM | POA: Diagnosis not present

## 2024-03-24 MED ORDER — KETOROLAC TROMETHAMINE 10 MG PO TABS: 10.0000 mg | ORAL_TABLET | Freq: Four times a day (QID) | ORAL | 0 refills | Status: AC | PRN

## 2024-03-24 NOTE — Progress Notes (Signed)
 Orthopedic Spine Surgery Office Note  Assessment: Patient is a 58 y.o. female with acute exacerbation of chronic low back pain   Plan: -Patient has tried gabapentin , flexeril , ibuprofen  -Prescribed Toradol  for her.  Refer her to pain management -Patient would need to get to a BMI of 40 or less prior to any elective spine surgery -Patient should return to office on an as-needed basis   Patient expressed understanding of the plan and all questions were answered to the patient's satisfaction.   ___________________________________________________________________________   History:  Patient is a 58 y.o. female who presents today for low back pain.  Patient has a history of chronic low back pain but has gotten acutely worse within the last 2 months.  There was no trauma or injury that preceded the onset of this acute worsening.  She has no pain radiating to either lower extremity.  Pain is localized to the lower lumbar spine and radiates to the immediately adjacent paraspinal muscles.  She has been trying Flexeril  and ibuprofen  without any significant relief.  She is also taking gabapentin  which has provided her with satisfactory relief of her pain.  No bowel or bladder incontinence.  No saddle anesthesia.  Treatments tried: gabapentin , flexeril , ibuprofen   Review of systems: Denies fevers and chills, night sweats, unexplained weight loss.  Has a history of thyroid  cancer that is in remission.  Has had pain that wakes her at night  Past medical history: Asthma Hypothyroidism  Depression/anxiety History of DVT/PE HLD HTN CAD DM (last A1c was 6.7 on 01/31/2024) Neuropathy COPD OSA Chronic pain Thyroid  cancer Glaucoma  Allergies: ceftriaxone, metformin , penicillin, flonase , nickel  Past surgical history:  Cholecystectomy Thyroidectomy Uterine artery embolization  Social history: Denies use of nicotine  product (smoking, vaping, patches, smokeless) Alcohol use: denies Denies  recreational drug use   Physical Exam:  BMI of 45.1  General: no acute distress, appears stated age Neurologic: alert, answering questions appropriately, following commands Respiratory: unlabored breathing on room air, symmetric chest rise Psychiatric: appropriate affect, normal cadence to speech   MSK (spine):  -Strength exam      Left  Right EHL    5/5  5/5 TA    5/5  5/5 GSC    5/5  5/5 Knee extension  5/5  5/5 Hip flexion   4/5  4/5  Hip flexion strength testing limited by back pain  -Sensory exam    Sensation intact to light touch in L3-S1 nerve distributions of bilateral lower extremities  Imaging: XRs of the lumbar spine from 03/24/2024 were independently reviewed and interpreted, showing disc height loss with small anterior osteophyte formation at L5/S1.  No other significant degenerative changes seen.  No evidence of instability on flexion/extension views.  No fracture or dislocation seen.  MRI of the lumbar spine from 01/30/2024 was independently reviewed and interpreted, showing DDD with central disc herniation at L5/S1.  No significant central, lateral recess, or foraminal stenosis seen.   Patient name: Melinda [name] Patient MRN: [id number] Date of visit: 03/24/24

## 2024-03-29 ENCOUNTER — Encounter (HOSPITAL_COMMUNITY): Payer: Self-pay

## 2024-03-29 ENCOUNTER — Other Ambulatory Visit: Payer: Self-pay

## 2024-03-29 ENCOUNTER — Emergency Department (HOSPITAL_COMMUNITY)
Admission: EM | Admit: 2024-03-29 | Discharge: 2024-03-30 | Disposition: A | Attending: Emergency Medicine | Admitting: Emergency Medicine

## 2024-03-29 ENCOUNTER — Emergency Department (HOSPITAL_COMMUNITY)

## 2024-03-29 DIAGNOSIS — M549 Dorsalgia, unspecified: Secondary | ICD-10-CM | POA: Diagnosis not present

## 2024-03-29 DIAGNOSIS — Z7901 Long term (current) use of anticoagulants: Secondary | ICD-10-CM | POA: Diagnosis not present

## 2024-03-29 DIAGNOSIS — I10 Essential (primary) hypertension: Secondary | ICD-10-CM | POA: Diagnosis not present

## 2024-03-29 DIAGNOSIS — E119 Type 2 diabetes mellitus without complications: Secondary | ICD-10-CM | POA: Insufficient documentation

## 2024-03-29 DIAGNOSIS — M79606 Pain in leg, unspecified: Secondary | ICD-10-CM | POA: Diagnosis not present

## 2024-03-29 DIAGNOSIS — K921 Melena: Secondary | ICD-10-CM | POA: Diagnosis not present

## 2024-03-29 DIAGNOSIS — D72829 Elevated white blood cell count, unspecified: Secondary | ICD-10-CM | POA: Insufficient documentation

## 2024-03-29 DIAGNOSIS — K5730 Diverticulosis of large intestine without perforation or abscess without bleeding: Secondary | ICD-10-CM | POA: Diagnosis not present

## 2024-03-29 DIAGNOSIS — Z79899 Other long term (current) drug therapy: Secondary | ICD-10-CM | POA: Diagnosis not present

## 2024-03-29 DIAGNOSIS — K5900 Constipation, unspecified: Secondary | ICD-10-CM | POA: Insufficient documentation

## 2024-03-29 DIAGNOSIS — K6289 Other specified diseases of anus and rectum: Secondary | ICD-10-CM | POA: Insufficient documentation

## 2024-03-29 DIAGNOSIS — R109 Unspecified abdominal pain: Secondary | ICD-10-CM | POA: Diagnosis present

## 2024-03-29 LAB — CBC WITH DIFFERENTIAL/PLATELET
Abs Immature Granulocytes: 0.05 K/uL (ref 0.00–0.07)
Basophils Absolute: 0 K/uL (ref 0.0–0.1)
Basophils Relative: 0 %
Eosinophils Absolute: 0 K/uL (ref 0.0–0.5)
Eosinophils Relative: 0 %
HCT: 44.6 % (ref 36.0–46.0)
Hemoglobin: 14.6 g/dL (ref 12.0–15.0)
Immature Granulocytes: 0 %
Lymphocytes Relative: 6 %
Lymphs Abs: 0.9 K/uL (ref 0.7–4.0)
MCH: 29.9 pg (ref 26.0–34.0)
MCHC: 32.7 g/dL (ref 30.0–36.0)
MCV: 91.4 fL (ref 80.0–100.0)
Monocytes Absolute: 0.7 K/uL (ref 0.1–1.0)
Monocytes Relative: 5 %
Neutro Abs: 13.3 K/uL — ABNORMAL HIGH (ref 1.7–7.7)
Neutrophils Relative %: 89 %
Platelets: 248 K/uL (ref 150–400)
RBC: 4.88 MIL/uL (ref 3.87–5.11)
RDW: 12.6 % (ref 11.5–15.5)
WBC: 15 K/uL — ABNORMAL HIGH (ref 4.0–10.5)
nRBC: 0 % (ref 0.0–0.2)

## 2024-03-29 LAB — COMPREHENSIVE METABOLIC PANEL WITH GFR
ALT: 40 U/L (ref 0–44)
AST: 27 U/L (ref 15–41)
Albumin: 3.6 g/dL (ref 3.5–5.0)
Alkaline Phosphatase: 108 U/L (ref 38–126)
Anion gap: 10 (ref 5–15)
BUN: 14 mg/dL (ref 6–20)
CO2: 26 mmol/L (ref 22–32)
Calcium: 8.8 mg/dL — ABNORMAL LOW (ref 8.9–10.3)
Chloride: 107 mmol/L (ref 98–111)
Creatinine, Ser: 1.24 mg/dL — ABNORMAL HIGH (ref 0.44–1.00)
GFR, Estimated: 51 mL/min — ABNORMAL LOW (ref >60)
Glucose, Bld: 136 mg/dL — ABNORMAL HIGH (ref 70–99)
Potassium: 4.1 mmol/L (ref 3.5–5.1)
Sodium: 143 mmol/L (ref 135–145)
Total Bilirubin: 0.6 mg/dL (ref 0.0–1.2)
Total Protein: 6.8 g/dL (ref 6.5–8.1)

## 2024-03-29 LAB — LIPASE, BLOOD: Lipase: 16 U/L (ref 11–51)

## 2024-03-29 MED ORDER — IOHEXOL 350 MG/ML SOLN
100.0000 mL | Freq: Once | INTRAVENOUS | Status: AC | PRN
  Administered 2024-03-29: 100 mL via INTRAVENOUS

## 2024-03-29 MED ORDER — ALUM & MAG HYDROXIDE-SIMETH 200-200-20 MG/5ML PO SUSP
30.0000 mL | Freq: Once | ORAL | Status: AC
  Administered 2024-03-29: 30 mL via ORAL
  Filled 2024-03-29: qty 30

## 2024-03-29 MED ORDER — HYOSCYAMINE SULFATE 0.125 MG SL SUBL
0.2500 mg | SUBLINGUAL_TABLET | Freq: Once | SUBLINGUAL | Status: AC
  Administered 2024-03-29: 0.25 mg via SUBLINGUAL
  Filled 2024-03-29: qty 2

## 2024-03-29 NOTE — ED Provider Triage Note (Signed)
 Emergency Medicine Provider Triage Evaluation Note  Melinda [name] , a 58 y.o. female  was evaluated in triage.  Pt complains of abdominal pain and constipation with no bowel movement for the past 3 days.  The patient for she has not passed any flatus in that time.  Does report history of cholecystectomy.  Denies any fevers.  Reports she is having back pain as well but this is a chronic issue.  No incontinence or saddle anesthesia.  Review of Systems  Positive: See above Negative: None  Physical Exam  BP (!) 151/82   Pulse (!) 115   Temp 98.4 F (36.9 C) (Oral)   Resp 16   SpO2 98%  Gen:   Awake, no distress   Resp:  Normal effort  MSK:   Moves extremities without difficulty  Other:  Mildly distended abdomen that is diffusely tender, no guarding or rebound  Medical Decision Making  Medically screening exam initiated at 6:58 PM.  Appropriate orders placed.  Melinda [name] was informed that the remainder of the evaluation will be completed by another provider, this initial triage assessment does not replace that evaluation, and the importance of remaining in the ED until their evaluation is complete.     Ula Prentice SAUNDERS, MD 03/29/24 [phone]

## 2024-03-29 NOTE — ED Triage Notes (Signed)
 Pt bib ems from home; c/o constipation x 3 days; also c/o back and leg pain; recently started taking toradol  without relief; 185/105, cbg 135, p 114, 97% AR, rr 18

## 2024-03-30 DIAGNOSIS — M545 Low back pain, unspecified: Secondary | ICD-10-CM | POA: Diagnosis not present

## 2024-03-30 DIAGNOSIS — E119 Type 2 diabetes mellitus without complications: Secondary | ICD-10-CM | POA: Diagnosis not present

## 2024-03-30 DIAGNOSIS — R0602 Shortness of breath: Secondary | ICD-10-CM | POA: Diagnosis not present

## 2024-03-30 DIAGNOSIS — M129 Arthropathy, unspecified: Secondary | ICD-10-CM | POA: Diagnosis not present

## 2024-03-30 DIAGNOSIS — E7800 Pure hypercholesterolemia, unspecified: Secondary | ICD-10-CM | POA: Diagnosis not present

## 2024-03-30 DIAGNOSIS — M546 Pain in thoracic spine: Secondary | ICD-10-CM | POA: Diagnosis not present

## 2024-03-30 DIAGNOSIS — R160 Hepatomegaly, not elsewhere classified: Secondary | ICD-10-CM | POA: Diagnosis not present

## 2024-03-30 DIAGNOSIS — Z79899 Other long term (current) drug therapy: Secondary | ICD-10-CM | POA: Diagnosis not present

## 2024-03-30 LAB — URINALYSIS, ROUTINE W REFLEX MICROSCOPIC
Bilirubin Urine: NEGATIVE
Glucose, UA: NEGATIVE mg/dL
Hgb urine dipstick: NEGATIVE
Ketones, ur: NEGATIVE mg/dL
Leukocytes,Ua: NEGATIVE
Nitrite: NEGATIVE
Protein, ur: NEGATIVE mg/dL
Specific Gravity, Urine: 1.035 — ABNORMAL HIGH (ref 1.005–1.030)
pH: 7 (ref 5.0–8.0)

## 2024-03-30 MED ORDER — SODIUM CHLORIDE 0.9 % IV BOLUS
500.0000 mL | Freq: Once | INTRAVENOUS | Status: AC
  Administered 2024-03-30: 500 mL via INTRAVENOUS

## 2024-03-30 MED ORDER — POLYETHYLENE GLYCOL 3350 17 G PO PACK: 17.0000 g | PACK | Freq: Every day | ORAL | 0 refills | Status: AC

## 2024-03-30 MED ORDER — POLYETHYLENE GLYCOL 3350 17 G PO PACK
34.0000 g | PACK | ORAL | Status: AC
  Administered 2024-03-30: 34 g via ORAL
  Filled 2024-03-30: qty 2

## 2024-03-30 NOTE — ED Notes (Signed)
 MD at bedside, pt given warm blankets

## 2024-03-30 NOTE — ED Notes (Signed)
 500mL NS was never given, upon starting the fluids pt IV infiltrated. A new line was attempted to be established with no success. PA aware pt did not receive fluids and okay with it.

## 2024-03-30 NOTE — ED Provider Notes (Addendum)
 [facility] Provider Note   CSN: [id number] Arrival date & time: 03/29/24  8161     Patient presents with: Constipation   Melinda [name] is a 58 y.o. female with history of morbid obesity, uterine fibroids, migraines, menorrhagia, history of PE, type 2 diabetes, chronic cough, hypertension, migraines, lower extremity weakness.  Patient presents to ED for evaluation of constipation and abdominal pain.  States that she has not had a bowel movement in the last 3 days.  She reports a history of constipation.  Does not take narcotic pain medication.  Does not take Metamucil, MiraLAX  or stool softeners.  States she has slight pain to her rectum, denies blood in stool.  Denies nausea, vomiting or fevers.  She does endorse dysuria and states this is an ongoing for the last few days and today she has been unable to urinate.  Denies chest pain or shortness of breath. Denies that she has attempted to take any medications to address her constipation.   Constipation      Prior to Admission medications   Medication Sig Start Date End Date Taking? Authorizing Provider  albuterol  (PROVENTIL ) (2.5 MG/3ML) 0.083% nebulizer solution Take 3 mLs (2.5 mg total) by nebulization every 6 (six) hours as needed for wheezing or shortness of breath. Patient not taking: Reported on 01/30/2024 12/19/20   Vicci Barnie NOVAK, MD  albuterol  (VENTOLIN  HFA) 108 [phone] Base) MCG/ACT inhaler Inhale 2 puffs into the lungs every 6 (six) hours as needed for wheezing or shortness of breath. 12/19/20   Vicci Barnie NOVAK, MD  apixaban  (ELIQUIS ) 5 MG TABS tablet TAKE ONE TABLET BY MOUTH TWICE DAILY MORNING AND EVENING 08/26/22   Delbert Clam, MD  atorvastatin  (LIPITOR) 20 MG tablet TAKE ONE TABLET BY MOUTH ONCE DAILY AT BEDTIME 05/30/22   Newlin, Enobong, MD  budesonide -formoterol  (SYMBICORT ) 160-4.5 MCG/ACT inhaler Inhale 2 puffs into the lungs 2 (two) times daily. 08/21/21   Vicci Barnie NOVAK,  MD  buPROPion  (WELLBUTRIN  XL) 300 MG 24 hr tablet Take 300 mg by mouth daily.  Patient taking differently: Take 300 mg by mouth daily. Take along with 150mg  tablet 09/12/17   [provider]  busPIRone  (BUSPAR ) 15 MG tablet Take 15 mg by mouth 2 (two) times daily.    [provider]  Cholecalciferol  (VITAMIN D3) 10 MCG (400 UNIT) CAPS TAKE ONE CAPSULE BY MOUTH ONCE DAILY (AM) 08/26/22   Newlin, Enobong, MD  cyclobenzaprine  (FLEXERIL ) 10 MG tablet Take 10 mg by mouth 3 (three) times daily as needed.    [provider]  doxepin  (SINEQUAN ) 100 MG capsule Take 100 mg by mouth at bedtime.    [provider]  Erenumab -aooe (AIMOVIG ) 140 MG/ML SOAJ Inject 140 mg into the skin every 28 (twenty-eight) days. 01/06/24   Skeet, Adam R, DO  gabapentin  (NEURONTIN ) 300 MG capsule TAKE ONE CAPSULE BY MOUTH THREE TIMES DAILY (AM+NOON+BEDTIME) Patient taking differently: Take 300 mg by mouth 3 (three) times daily. TAKE ONE CAPSULE BY MOUTH THREE TIMES DAILY (AM+NOON+BEDTIME) 04/04/22   Vicci Barnie NOVAK, MD  ketoconazole  (NIZORAL ) 2 % cream Apply 1 Application topically daily. Apply to affected areas on face twice a day for 3 weeks after using nystatin -triamcinolone  for 1 week. Patient taking differently: Apply 1 Application topically daily as needed (severe dermatitis). 05/01/23   Alm Delon SAILOR, DO  ketorolac  (TORADOL ) 10 MG tablet Take 1 tablet (10 mg total) by mouth every 6 (six) hours as needed for severe pain (  pain score 7-10). 03/24/24   Georgina Ozell LABOR, MD  levothyroxine  (SYNTHROID ) 300 MCG tablet Take 150-300 mcg by mouth See admin instructions. Take 1 tablet by mouth daily for 6 days, then take 1/2 tablet on the 7th day    [provider]  loratadine  (CLARITIN ) 10 MG tablet TAKE 1 TABLET (10 MG TOTAL) BY MOUTH DAILY (AM) 08/26/22   Newlin, Enobong, MD  metoprolol  tartrate (LOPRESSOR ) 50 MG tablet TAKE ONE TABLET BY MOUTH TWICE DAILY (AM+BEDTIME) Patient taking  differently: Take 75 mg by mouth 2 (two) times daily. TAKE ONE TABLET BY MOUTH TWICE DAILY (AM+BEDTIME) 08/26/22   Newlin, Enobong, MD  montelukast  (SINGULAIR ) 10 MG tablet TAKE 1 TABLET BY MOUTH EVERY EVENING AT BEDTIME 08/26/22   Newlin, Enobong, MD  oxybutynin  (DITROPAN -XL) 5 MG 24 hr tablet Take 5 mg by mouth at bedtime.    [provider]  prazosin (MINIPRESS) 5 MG capsule Take 5 mg by mouth at bedtime.    [provider]  Semaglutide, 1 MG/DOSE, 2 MG/1.5ML SOPN Inject 2 mg into the skin once a week. 02/21/22   [provider]  topiramate  (TOPAMAX ) 200 MG tablet TAKE 1 TABLET (200 MG TOTAL) BY MOUTH AT BEDTIME. 03/24/24   Skeet, Adam R, DO  VRAYLAR  3 MG capsule Take 3 mg by mouth daily. 02/07/23   [provider]  WELLBUTRIN  XL 150 MG 24 hr tablet Take 150 mg by mouth every morning. Take along with 300mg  for a total daily dose of 450mg  01/22/24   [provider]    Allergies: Ceftriaxone, Metformin , Penicillin g, Penicillins, Shellfish allergy, Shellfish-derived products, Citrus, Flonase  [fluticasone  propionate], Gold-containing drug products, and Nickel    Review of Systems  Gastrointestinal:  Positive for constipation.  All other systems reviewed and are negative.   Updated Vital Signs BP (!) 157/87   Pulse 97   Temp 98.1 F (36.7 C) (Oral)   Resp 20   SpO2 98%   Physical Exam Vitals and nursing note reviewed.  Constitutional:      General: She is not in acute distress.    Appearance: She is well-developed.  HENT:     Head: Normocephalic and atraumatic.  Eyes:     Conjunctiva/sclera: Conjunctivae normal.  Cardiovascular:     Rate and Rhythm: Normal rate and regular rhythm.     Heart sounds: No murmur heard. Pulmonary:     Effort: Pulmonary effort is normal. No respiratory distress.     Breath sounds: Normal breath sounds.  Abdominal:     Palpations: Abdomen is soft.     Tenderness: There is no abdominal tenderness.     Comments:  No tenderness to patient abdomen. Soft and compressible  Musculoskeletal:        General: No swelling.     Cervical back: Neck supple.  Skin:    General: Skin is warm and dry.     Capillary Refill: Capillary refill takes less than 2 seconds.  Neurological:     Mental Status: She is alert and oriented to person, place, and time. Mental status is at baseline.  Psychiatric:        Mood and Affect: Mood normal.     (all labs ordered are listed, but only abnormal results are displayed) Labs Reviewed  CBC WITH DIFFERENTIAL/PLATELET - Abnormal; Notable for the following components:      Result Value   WBC 15.0 (*)    Neutro Abs 13.3 (*)    All other components within normal  limits  COMPREHENSIVE METABOLIC PANEL WITH GFR - Abnormal; Notable for the following components:   Glucose, Bld 136 (*)    Creatinine, Ser 1.24 (*)    Calcium  8.8 (*)    GFR, Estimated 51 (*)    All other components within normal limits  URINALYSIS, ROUTINE W REFLEX MICROSCOPIC - Abnormal; Notable for the following components:   Specific Gravity, Urine 1.035 (*)    All other components within normal limits  LIPASE, BLOOD    EKG: None  Radiology: CT ABDOMEN PELVIS W CONTRAST Result Date: 03/29/2024 CLINICAL DATA:  Blood in stool abdominal pain EXAM: CT ABDOMEN AND PELVIS WITH CONTRAST TECHNIQUE: Multidetector CT imaging of the abdomen and pelvis was performed using the standard protocol following bolus administration of intravenous contrast. RADIATION DOSE REDUCTION: This exam was performed according to the departmental dose-optimization program which includes automated exposure control, adjustment of the mA and/or kV according to patient size and/or use of iterative reconstruction technique. CONTRAST:  OMNIPAQUE  IOHEXOL  350 MG/ML SOLN COMPARISON:  CT 03/29/2023, 07/11/2017, 08/18/2015 FINDINGS: Lower chest: Lung bases demonstrate no acute airspace disease. Hepatobiliary: No focal liver abnormality is seen.  Status post cholecystectomy. No biliary dilatation. Pancreas: Unremarkable. No pancreatic ductal dilatation or surrounding inflammatory changes. Spleen: Normal in size without focal abnormality. Adrenals/Urinary Tract: Stable left adrenal gland. Right adrenal mass containing macroscopic fat and calcifications, measures 6.4 x 5.7 cm stable compared with recent priors, slowly enlarged compared with more remote exam from 2017. Kidneys show no hydronephrosis. The bladder is unremarkable. Stomach/Bowel: Stomach within normal limits. No dilated small bowel. No acute bowel wall thickening. Negative appendix. Mild diverticular disease of the left colon. Moderate stool in the rectum Vascular/Lymphatic: Aortic atherosclerosis. No enlarged abdominal or pelvic lymph nodes. Reproductive: Probable uterine fibroids.  No suspicious adnexal mass Other: Negative for pelvic effusion or free air Musculoskeletal: No acute or suspicious osseous abnormality IMPRESSION: 1. No CT evidence for acute intra-abdominal or pelvic abnormality. 2. Mild diverticular disease of the left colon without acute inflammatory process. Moderate to large stool burden suggesting constipation with moderate retained feces at the rectum. 3. 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 4. Aortic atherosclerosis. Aortic Atherosclerosis (ICD10-I70.0). Electronically Signed   By: Luke Bun M.D.   On: 03/29/2024 21:18    Procedures   Medications Ordered in the ED  hyoscyamine (LEVSIN SL) SL tablet 0.25 mg (0.25 mg Sublingual Given 03/29/24 1923)  alum & mag hydroxide-simeth (MAALOX/MYLANTA) 200-200-20 MG/5ML suspension 30 mL (30 mLs Oral Given 03/29/24 1923)  iohexol  (OMNIPAQUE ) 350 MG/ML injection 100 mL (100 mLs Intravenous Contrast Given 03/29/24 2051)  sodium chloride  0.9 % bolus 500 mL (0 mLs Intravenous  Stopped 03/30/24 0254)    Medical Decision Making Amount and/or Complexity of Data Reviewed Labs: ordered.  Risk OTC drugs.    This is a 58 year old female presenting to the ED out of concern of constipation.  On exam, afebrile, tachycardic.  Lung sounds are clear bilaterally, no hypoxia.  Abdomen soft and compressible with no tenderness.  Neurological examination is at baseline.  Overall patient is nontoxic in appearance with reassuring vital signs.  Patient lab work initiated in triage include CBC, CMP, lipase, urinalysis, CT scan of abdomen.  Patient CBC with leukocytosis to 15 here with a left shift.  Metabolic panel with baseline creatinine 1.24, GFR 51, anion gap 10.  No elevated LFT.  No  electrolyte derangement.  Lipase 16.  Urinalysis negative for all.  CT abdomen pelvis reveals mild diverticular disease of the left colon without acute inflammatory process.  Also noted to have moderate to large stool burden suggesting constipation with moderate retained feces of the rectum.  Patient reports that she has not attempted any over-the-counter medications at home to treat constipation so she has been advised to go home and consume 6 caps of MiraLAX  with Gatorade.  She was advised to begin supplementing with Metamucil as well as a stool softener such as Colace.  Rectal exam performed with no evidence of impaction at this time.  Patient did arrive tachycardic however her pulse rate normalized without intervention.  We did attempt to give the patient a 500 bag of fluid however her IV infiltrated and this was never given.  Her pulse rate normalized without intervention.  She denies any chest pain or shortness of breath.  She denies any fevers at home.  She states she has been unable to use bathroom today but urinalysis negative for all.  No evidence of colitis on CT scan.  No evidence of other intra-abdominal process or infection.  At this time the patient will be discharged home.  She was  advised to follow-up with her PCP for recheck of laboratory values and she voiced understanding.  She was encouraged to take MiraLAX , Metamucil, Colace at home to help with bowel movements and she voiced understanding.  She reports that she does not have enough money to pay for MiraLAX  so she was sent home with a prescription and was given 34 g prior to discharge. She was advised to return to ED with new symptom such as fevers, nausea or vomiting and she voiced understanding.  She is stable to discharge at this time.   Final diagnoses:  Constipation, unspecified constipation type    ED Discharge Orders     None           Ruthell Lonni FALCON, PA-C 03/30/24 0324    Jerral Meth, MD 03/30/24 0700

## 2024-03-30 NOTE — Discharge Instructions (Addendum)
 It was a pleasure taking part in your care.  As we discussed, please begin treating your constipation by purchasing Metamucil and taking this as prescribed.  This is fiber will help bulk in your stools to avoid any constipation in the future.  Please begin taking Colace as directed, this is a stool softener and will help to have softer bowel movements.  Please also take MiraLAX  1 cap/1 packet of 17grams and mix with a bottle of Gatorade. Please take 1 cap every 1 hour until you have a bowel movement. This will help you to clear out your rectum.  If you are unable to have a bowel movement even with medications given as prescribed above, please return to the ED for further care.  If you have any fevers, nausea or vomiting please return to the ED.  Your lab work here did show an elevated white count however the rest of your evaluation did not show evidence of any kind of infection or source.  Please follow-up with your PCP in the next 1 week to have your laboratory values reevaluated.  Please read attached guide concerning constipation.

## 2024-03-30 NOTE — ED Notes (Signed)
 In and out cath performed with the RN and nurse tech as witness. Pt tolerated well, urine sent.

## 2024-03-31 DIAGNOSIS — R269 Unspecified abnormalities of gait and mobility: Secondary | ICD-10-CM | POA: Diagnosis not present

## 2024-04-01 DIAGNOSIS — R269 Unspecified abnormalities of gait and mobility: Secondary | ICD-10-CM | POA: Diagnosis not present

## 2024-04-01 DIAGNOSIS — F411 Generalized anxiety disorder: Secondary | ICD-10-CM | POA: Diagnosis not present

## 2024-04-02 DIAGNOSIS — M546 Pain in thoracic spine: Secondary | ICD-10-CM | POA: Diagnosis not present

## 2024-04-02 DIAGNOSIS — Z79899 Other long term (current) drug therapy: Secondary | ICD-10-CM | POA: Diagnosis not present

## 2024-04-02 DIAGNOSIS — R269 Unspecified abnormalities of gait and mobility: Secondary | ICD-10-CM | POA: Diagnosis not present

## 2024-04-02 DIAGNOSIS — M545 Low back pain, unspecified: Secondary | ICD-10-CM | POA: Diagnosis not present

## 2024-04-03 DIAGNOSIS — R269 Unspecified abnormalities of gait and mobility: Secondary | ICD-10-CM | POA: Diagnosis not present

## 2024-04-05 DIAGNOSIS — R269 Unspecified abnormalities of gait and mobility: Secondary | ICD-10-CM | POA: Diagnosis not present

## 2024-04-06 DIAGNOSIS — R269 Unspecified abnormalities of gait and mobility: Secondary | ICD-10-CM | POA: Diagnosis not present

## 2024-04-06 DIAGNOSIS — R160 Hepatomegaly, not elsewhere classified: Secondary | ICD-10-CM | POA: Diagnosis not present

## 2024-04-07 DIAGNOSIS — R269 Unspecified abnormalities of gait and mobility: Secondary | ICD-10-CM | POA: Diagnosis not present

## 2024-04-08 DIAGNOSIS — F411 Generalized anxiety disorder: Secondary | ICD-10-CM | POA: Diagnosis not present

## 2024-04-08 DIAGNOSIS — R269 Unspecified abnormalities of gait and mobility: Secondary | ICD-10-CM | POA: Diagnosis not present

## 2024-04-09 DIAGNOSIS — R269 Unspecified abnormalities of gait and mobility: Secondary | ICD-10-CM | POA: Diagnosis not present

## 2024-04-09 DIAGNOSIS — D72829 Elevated white blood cell count, unspecified: Secondary | ICD-10-CM | POA: Diagnosis not present

## 2024-04-09 DIAGNOSIS — R6883 Chills (without fever): Secondary | ICD-10-CM | POA: Diagnosis not present

## 2024-04-09 DIAGNOSIS — K5901 Slow transit constipation: Secondary | ICD-10-CM | POA: Diagnosis not present

## 2024-04-12 DIAGNOSIS — F411 Generalized anxiety disorder: Secondary | ICD-10-CM | POA: Diagnosis not present

## 2024-04-12 DIAGNOSIS — R160 Hepatomegaly, not elsewhere classified: Secondary | ICD-10-CM | POA: Diagnosis not present

## 2024-04-12 DIAGNOSIS — Z79899 Other long term (current) drug therapy: Secondary | ICD-10-CM | POA: Diagnosis not present

## 2024-04-12 DIAGNOSIS — Z6841 Body Mass Index (BMI) 40.0 and over, adult: Secondary | ICD-10-CM | POA: Diagnosis not present

## 2024-04-12 DIAGNOSIS — M546 Pain in thoracic spine: Secondary | ICD-10-CM | POA: Diagnosis not present

## 2024-04-12 DIAGNOSIS — R300 Dysuria: Secondary | ICD-10-CM | POA: Diagnosis not present

## 2024-04-12 DIAGNOSIS — R269 Unspecified abnormalities of gait and mobility: Secondary | ICD-10-CM | POA: Diagnosis not present

## 2024-04-12 DIAGNOSIS — R7401 Elevation of levels of liver transaminase levels: Secondary | ICD-10-CM | POA: Diagnosis not present

## 2024-04-12 DIAGNOSIS — M545 Low back pain, unspecified: Secondary | ICD-10-CM | POA: Diagnosis not present

## 2024-04-12 DIAGNOSIS — F4310 Post-traumatic stress disorder, unspecified: Secondary | ICD-10-CM | POA: Diagnosis not present

## 2024-04-12 DIAGNOSIS — F331 Major depressive disorder, recurrent, moderate: Secondary | ICD-10-CM | POA: Diagnosis not present

## 2024-04-13 DIAGNOSIS — R269 Unspecified abnormalities of gait and mobility: Secondary | ICD-10-CM | POA: Diagnosis not present

## 2024-04-14 DIAGNOSIS — R269 Unspecified abnormalities of gait and mobility: Secondary | ICD-10-CM | POA: Diagnosis not present

## 2024-04-15 DIAGNOSIS — R269 Unspecified abnormalities of gait and mobility: Secondary | ICD-10-CM | POA: Diagnosis not present

## 2024-04-16 DIAGNOSIS — J309 Allergic rhinitis, unspecified: Secondary | ICD-10-CM | POA: Diagnosis not present

## 2024-04-16 DIAGNOSIS — I2699 Other pulmonary embolism without acute cor pulmonale: Secondary | ICD-10-CM | POA: Diagnosis not present

## 2024-04-16 DIAGNOSIS — Z87891 Personal history of nicotine dependence: Secondary | ICD-10-CM | POA: Diagnosis not present

## 2024-04-16 DIAGNOSIS — R269 Unspecified abnormalities of gait and mobility: Secondary | ICD-10-CM | POA: Diagnosis not present

## 2024-04-16 DIAGNOSIS — J4489 Other specified chronic obstructive pulmonary disease: Secondary | ICD-10-CM | POA: Diagnosis not present

## 2024-04-16 DIAGNOSIS — R0602 Shortness of breath: Secondary | ICD-10-CM | POA: Diagnosis not present

## 2024-04-19 DIAGNOSIS — R269 Unspecified abnormalities of gait and mobility: Secondary | ICD-10-CM | POA: Diagnosis not present

## 2024-04-20 DIAGNOSIS — R269 Unspecified abnormalities of gait and mobility: Secondary | ICD-10-CM | POA: Diagnosis not present

## 2024-04-22 DIAGNOSIS — F411 Generalized anxiety disorder: Secondary | ICD-10-CM | POA: Diagnosis not present

## 2024-04-29 DIAGNOSIS — F411 Generalized anxiety disorder: Secondary | ICD-10-CM | POA: Diagnosis not present

## 2024-05-01 DIAGNOSIS — R269 Unspecified abnormalities of gait and mobility: Secondary | ICD-10-CM | POA: Diagnosis not present

## 2024-05-03 ENCOUNTER — Encounter: Payer: Self-pay | Admitting: Radiology

## 2024-05-03 DIAGNOSIS — R269 Unspecified abnormalities of gait and mobility: Secondary | ICD-10-CM | POA: Diagnosis not present

## 2024-05-04 DIAGNOSIS — R269 Unspecified abnormalities of gait and mobility: Secondary | ICD-10-CM | POA: Diagnosis not present

## 2024-05-05 DIAGNOSIS — Z87891 Personal history of nicotine dependence: Secondary | ICD-10-CM | POA: Diagnosis not present

## 2024-05-10 DIAGNOSIS — R269 Unspecified abnormalities of gait and mobility: Secondary | ICD-10-CM | POA: Diagnosis not present

## 2024-05-11 DIAGNOSIS — R269 Unspecified abnormalities of gait and mobility: Secondary | ICD-10-CM | POA: Diagnosis not present

## 2024-05-12 ENCOUNTER — Ambulatory Visit: Attending: Student in an Organized Health Care Education/Training Program | Admitting: Cardiovascular Disease

## 2024-05-12 ENCOUNTER — Encounter: Payer: Self-pay | Admitting: Cardiovascular Disease

## 2024-05-12 DIAGNOSIS — Z86711 Personal history of pulmonary embolism: Secondary | ICD-10-CM | POA: Diagnosis not present

## 2024-05-12 DIAGNOSIS — E782 Mixed hyperlipidemia: Secondary | ICD-10-CM | POA: Diagnosis not present

## 2024-05-12 DIAGNOSIS — I10 Essential (primary) hypertension: Secondary | ICD-10-CM | POA: Insufficient documentation

## 2024-05-12 DIAGNOSIS — R269 Unspecified abnormalities of gait and mobility: Secondary | ICD-10-CM | POA: Diagnosis not present

## 2024-05-12 DIAGNOSIS — R0609 Other forms of dyspnea: Secondary | ICD-10-CM | POA: Insufficient documentation

## 2024-05-12 DIAGNOSIS — I519 Heart disease, unspecified: Secondary | ICD-10-CM | POA: Insufficient documentation

## 2024-05-12 NOTE — Patient Instructions (Signed)
 Medication Instructions:  Your physician recommends that you continue on your current medications as directed. Please refer to the Current Medication list given to you today.  *If you need a refill on your cardiac medications before your next appointment, please call your pharmacy*  Follow-Up: At [facility], you and your health needs are our priority.  As part of our continuing mission to provide you with exceptional heart care, our providers are all part of one team.  This team includes your primary Cardiologist (physician) and Advanced Practice Providers or APPs (Physician Assistants and Nurse Practitioners) who all work together to provide you with the care you need, when you need it.  Your next appointment:   As needed  Provider:   Dorn Lesches, MD  We recommend signing up for the patient portal called MyChart.  Sign up information is provided on this After Visit Summary.  MyChart is used to connect with patients for Virtual Visits (Telemedicine).  Patients are able to view lab/test results, encounter notes, upcoming appointments, etc.  Non-urgent messages can be sent to your provider as well.    To learn more about what you can do with MyChart, go to [email or web address].

## 2024-05-12 NOTE — Assessment & Plan Note (Signed)
 History of hyperlipidemia on statin therapy with lipid profile performed 08/26/2023 revealing total cholesterol 117, LDL 59 and HDL 43.

## 2024-05-12 NOTE — Assessment & Plan Note (Signed)
 BMI 45.  Her weight when I saw her 4 years ago was 332.

## 2024-05-12 NOTE — Assessment & Plan Note (Signed)
 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

## 2024-05-12 NOTE — Assessment & Plan Note (Signed)
 Patient was referred for orthopnea and dyspnea on exertion.  She has multiple reasons for shortness of breath including COPD, reactive airways disease, obesity.  She is minimally ambulatory with a walker, mostly stays in her house and is very sedentary.  I believe her mild LV dysfunction is probably not contributory.

## 2024-05-12 NOTE — Assessment & Plan Note (Signed)
 Mild LV dysfunction with 2D echo performed 11/09/2014 revealing EF of 50%.  Her most recent echo performed 9/70/25 revealed a similar EF with grade 1 diastolic dysfunction and no valvular abnormalities.  I suspect this is related to her obesity/hypoventilation and probably is nonischemic.

## 2024-05-12 NOTE — Progress Notes (Signed)
 05/12/2024 Melinda [name]   1966/05/14  [id number]  Primary Physician Melinda [name], [place]  E, PA Primary Cardiologist: Dorn JINNY Lesches MD [place]I [place], [place]  HPI:  Melinda [name] is a 58 y.o.   morbidly overweight single African-American female mother of 1 son, grandmother of 1 grandson referred to me by Melinda Moores, PA-C for evaluation of atypical chest pain.  She is currently disabled because of back issues and walks with a walker because of instability.  I last saw her in the office 02/16/2020.  Her cardiovascular risk factors include 15-pack-year tobacco abuse having quit in 2016, treated hypertension, hyperlipidemia and diet-controlled diabetes.  There is no family history for heart disease.  She is never had a heart attack or stroke.  She has had a remote pulmonary embolus May 2016 on Eliquis  oral anticoagulation.  She is also had thyroid  cancer status post surgical resection by Dr. Cyrilla 11/17/2015.  She was seen in the emergency room on 12/30/2019 with atypical right-sided chest pain with a negative work-up.  She has had rare subsequent episodes.  Since I saw her 4 years ago she was referred back for dyspnea on exertion and orthopnea.  She has lost approximately 15 pounds since I saw her last.  She really does not get out of the house and lives a very sedentary lifestyle.  She is minimally ambulatory with a walker.  She does have reactive airways disease, COPD and obstructive sleep apnea on CPAP.  Her 2D echo performed 11/09/2014 revealed an EF of 50% with mild diffuse hypokinesia.  Her most recent 2D echo performed 03/17/2024 revealed a similar EF with grade 1 diastolic dysfunction.   Current Meds  Medication Sig   albuterol  (PROVENTIL ) (2.5 MG/3ML) 0.083% nebulizer solution Take 3 mLs (2.5 mg total) by nebulization every 6 (six) hours as needed for wheezing or shortness of breath.   albuterol  (VENTOLIN  HFA) 108 (90 Base) MCG/ACT inhaler Inhale 2 puffs into the lungs every 6 (six)  hours as needed for wheezing or shortness of breath.   apixaban  (ELIQUIS ) 5 MG TABS tablet TAKE ONE TABLET BY MOUTH TWICE DAILY MORNING AND EVENING   atorvastatin  (LIPITOR) 20 MG tablet TAKE ONE TABLET BY MOUTH ONCE DAILY AT BEDTIME   budesonide -formoterol  (SYMBICORT ) 160-4.5 MCG/ACT inhaler Inhale 2 puffs into the lungs 2 (two) times daily.   buPROPion  (WELLBUTRIN  XL) 300 MG 24 hr tablet Take 300 mg by mouth daily.  (Patient taking differently: Take 300 mg by mouth daily. Take along with 150mg  tablet)   busPIRone  (BUSPAR ) 15 MG tablet Take 15 mg by mouth 2 (two) times daily.   Cholecalciferol  (VITAMIN D3) 10 MCG (400 UNIT) CAPS TAKE ONE CAPSULE BY MOUTH ONCE DAILY (AM)   cyclobenzaprine  (FLEXERIL ) 10 MG tablet Take 10 mg by mouth 3 (three) times daily as needed.   doxepin  (SINEQUAN ) 100 MG capsule Take 100 mg by mouth at bedtime.   Erenumab -aooe (AIMOVIG ) 140 MG/ML SOAJ Inject 140 mg into the skin every 28 (twenty-eight) days.   gabapentin  (NEURONTIN ) 300 MG capsule TAKE ONE CAPSULE BY MOUTH THREE TIMES DAILY (AM+NOON+BEDTIME) (Patient taking differently: Take 300 mg by mouth 3 (three) times daily. TAKE ONE CAPSULE BY MOUTH THREE TIMES DAILY (AM+NOON+BEDTIME))   ketoconazole  (NIZORAL ) 2 % cream Apply 1 Application topically daily. Apply to affected areas on face twice a day for 3 weeks after using nystatin -triamcinolone  for 1 week. (Patient taking differently: Apply 1 Application topically daily as needed (severe dermatitis).)   ketorolac  (TORADOL ) 10  MG tablet Take 1 tablet (10 mg total) by mouth every 6 (six) hours as needed for severe pain (pain score 7-10).   levothyroxine  (SYNTHROID ) 300 MCG tablet Take 150-300 mcg by mouth See admin instructions. Take 1 tablet by mouth daily for 6 days, then take 1/2 tablet on the 7th day   loratadine  (CLARITIN ) 10 MG tablet TAKE 1 TABLET (10 MG TOTAL) BY MOUTH DAILY (AM)   metoprolol  tartrate (LOPRESSOR ) 50 MG tablet TAKE ONE TABLET BY MOUTH TWICE DAILY  (AM+BEDTIME)   montelukast  (SINGULAIR ) 10 MG tablet TAKE 1 TABLET BY MOUTH EVERY EVENING AT BEDTIME   oxybutynin  (DITROPAN -XL) 5 MG 24 hr tablet Take 5 mg by mouth at bedtime.   prazosin (MINIPRESS) 5 MG capsule Take 5 mg by mouth at bedtime.   topiramate  (TOPAMAX ) 200 MG tablet TAKE 1 TABLET (200 MG TOTAL) BY MOUTH AT BEDTIME.   VRAYLAR  3 MG capsule Take 3 mg by mouth daily.   WELLBUTRIN  XL 150 MG 24 hr tablet Take 150 mg by mouth every morning. Take along with 300mg  for a total daily dose of 450mg      Allergies  Allergen Reactions   Ceftriaxone Anaphylaxis and Other (See Comments)    ROCEPHIN   Metformin  Anaphylaxis    ALL   Penicillin G Shortness Of Breath   Penicillins Shortness Of Breath    Has patient had a PCN reaction causing immediate rash, facial/tongue/throat swelling, SOB or lightheadedness with hypotension: Yes Has patient had a PCN reaction causing severe rash involving mucus membranes or skin necrosis: No Has patient had a PCN reaction that required hospitalization No Has patient had a PCN reaction occurring within the last 10 years: No If all of the above answers are NO, then may proceed with Cephalosporin use.    Shellfish Allergy Anaphylaxis   Shellfish Protein-Containing Drug Products Anaphylaxis   Citrus Rash   Flonase  [Fluticasone  Propionate]     Makes migraines worse   Gold-Containing Drug Products     HANDS ITCH   Nickel     HANDS SWELL    Social History   Socioeconomic History   Marital status: Single    Spouse name: Not on file   Number of children: 1   Years of education: 13   Highest education level: Some college, no degree  Occupational History   Occupation: disabled  Tobacco Use   Smoking status: Former    Current packs/day: 0.00    Average packs/day: 0.5 packs/day for 40.0 years (20.0 ttl pk-yrs)    Types: Cigarettes    Start date: 11/03/1974    Quit date: 11/03/2014    Years since quitting: 9.5    Passive exposure: Past   Smokeless  tobacco: Never  Vaping Use   Vaping status: Never Used  Substance and Sexual Activity   Alcohol use: No    Alcohol/week: 0.0 standard drinks of alcohol   Drug use: No   Sexual activity: Never    Birth control/protection: None  Other Topics Concern   Not on file  Social History Narrative   Patient is right-handed. She lives in a one level handicap accessible apartment. She avoids caffeine . She uses stationary stationary pedals to use for exercise.   Social Drivers of Corporate Investment Banker Strain: Low Risk  (05/16/2023)   Overall Financial Resource Strain (CARDIA)    Difficulty of Paying Living Expenses: Not very hard  Food Insecurity: No Food Insecurity (01/31/2024)   Hunger Vital Sign    Worried About Running  Out of Food in the Last Year: Never true    Ran Out of Food in the Last Year: Never true  Transportation Needs: No Transportation Needs (10/24/2023)   PRAPARE - Administrator, Civil Service (Medical): No    Lack of Transportation (Non-Medical): No  Physical Activity: Inactive (02/18/2023)   Exercise Vital Sign    Days of Exercise per Week: 0 days    Minutes of Exercise per Session: 0 min  Stress: No Stress Concern Present (04/09/2023)   [employer] of Occupational Health - Occupational Stress Questionnaire    Feeling of Stress : Only a little  Social Connections: Socially Isolated (06/16/2023)   Social Connection and Isolation Panel    Frequency of Communication with Friends and Family: More than three times a week    Frequency of Social Gatherings with Friends and Family: More than three times a week    Attends Religious Services: Never    Database Administrator or Organizations: No    Attends Banker Meetings: Never    Marital Status: Never married  Intimate Partner Violence: Not At Risk (10/24/2023)   Humiliation, Afraid, Rape, and Kick questionnaire    Fear of Current or Ex-Partner: No    Emotionally Abused: No    Physically  Abused: No    Sexually Abused: No     Review of Systems: General: negative for chills, fever, night sweats or weight changes.  Cardiovascular: negative for chest pain, dyspnea on exertion, edema, orthopnea, palpitations, paroxysmal nocturnal dyspnea or shortness of breath Dermatological: negative for rash Respiratory: negative for cough or wheezing Urologic: negative for hematuria Abdominal: negative for nausea, vomiting, diarrhea, bright red blood per rectum, melena, or hematemesis Neurologic: negative for visual changes, syncope, or dizziness All other systems reviewed and are otherwise negative except as noted above.    Blood pressure 123/80, pulse 93, height 5' 10 (1.778 m), weight (!) 318 lb (144.2 kg), SpO2 95%.  General appearance: alert and no distress Neck: no adenopathy, no carotid bruit, no JVD, supple, symmetrical, trachea midline, and thyroid  not enlarged, symmetric, no tenderness/mass/nodules Lungs: clear to auscultation bilaterally Heart: regular rate and rhythm, S1, S2 normal, no murmur, click, rub or gallop Extremities: extremities normal, atraumatic, no cyanosis or edema Pulses: 2+ and symmetric Skin: Skin color, texture, turgor normal. No rashes or lesions Neurologic: Grossly normal  EKG not performed today      ASSESSMENT AND PLAN:   Morbid obesity (HCC) BMI 45.  Her weight when I saw her 4 years ago was 332.  History of pulmonary embolism History of pulm embolism remotely in 2016 currently on Eliquis .  Primary hypertension History of essential hypertension blood pressure measured today at 123/80.  She is on metoprolol .  Hyperlipidemia History of hyperlipidemia on statin therapy with lipid profile performed 08/26/2023 revealing total cholesterol 117, LDL 59 and HDL 43.  LV dysfunction Mild LV dysfunction with 2D echo performed 11/09/2014 revealing EF of 50%.  Her most recent echo performed 9/70/25 revealed a similar EF with grade 1 diastolic dysfunction  and no valvular abnormalities.  I suspect this is related to her obesity/hypoventilation and probably is nonischemic.  Dyspnea on exertion Patient was referred for orthopnea and dyspnea on exertion.  She has multiple reasons for shortness of breath including COPD, reactive airways disease, obesity.  She is minimally ambulatory with a walker, mostly stays in her house and is very sedentary.  I believe her mild LV dysfunction is probably not contributory.  Dorn DOROTHA Lesches MD FACP,FACC,FAHA, [facility] 05/12/2024 9:51 AM

## 2024-05-12 NOTE — Assessment & Plan Note (Signed)
 History of essential hypertension blood pressure measured today at 123/80.  She is on metoprolol .

## 2024-05-13 DIAGNOSIS — Z6841 Body Mass Index (BMI) 40.0 and over, adult: Secondary | ICD-10-CM | POA: Diagnosis not present

## 2024-05-13 DIAGNOSIS — E559 Vitamin D deficiency, unspecified: Secondary | ICD-10-CM | POA: Diagnosis not present

## 2024-05-13 DIAGNOSIS — R030 Elevated blood-pressure reading, without diagnosis of hypertension: Secondary | ICD-10-CM | POA: Diagnosis not present

## 2024-05-13 DIAGNOSIS — R7401 Elevation of levels of liver transaminase levels: Secondary | ICD-10-CM | POA: Diagnosis not present

## 2024-05-13 DIAGNOSIS — M4807 Spinal stenosis, lumbosacral region: Secondary | ICD-10-CM | POA: Diagnosis not present

## 2024-05-13 DIAGNOSIS — R269 Unspecified abnormalities of gait and mobility: Secondary | ICD-10-CM | POA: Diagnosis not present

## 2024-05-19 ENCOUNTER — Encounter: Payer: Self-pay | Admitting: Podiatry

## 2024-05-19 ENCOUNTER — Ambulatory Visit: Admitting: Podiatry

## 2024-05-19 DIAGNOSIS — E1142 Type 2 diabetes mellitus with diabetic polyneuropathy: Secondary | ICD-10-CM

## 2024-05-19 DIAGNOSIS — B351 Tinea unguium: Secondary | ICD-10-CM

## 2024-05-19 DIAGNOSIS — R269 Unspecified abnormalities of gait and mobility: Secondary | ICD-10-CM | POA: Diagnosis not present

## 2024-05-19 DIAGNOSIS — M79674 Pain in right toe(s): Secondary | ICD-10-CM

## 2024-05-19 DIAGNOSIS — M79675 Pain in left toe(s): Secondary | ICD-10-CM

## 2024-05-20 DIAGNOSIS — G4733 Obstructive sleep apnea (adult) (pediatric): Secondary | ICD-10-CM | POA: Diagnosis not present

## 2024-05-20 DIAGNOSIS — R269 Unspecified abnormalities of gait and mobility: Secondary | ICD-10-CM | POA: Diagnosis not present

## 2024-05-21 DIAGNOSIS — R269 Unspecified abnormalities of gait and mobility: Secondary | ICD-10-CM | POA: Diagnosis not present

## 2024-05-24 DIAGNOSIS — R269 Unspecified abnormalities of gait and mobility: Secondary | ICD-10-CM | POA: Diagnosis not present

## 2024-05-25 DIAGNOSIS — R269 Unspecified abnormalities of gait and mobility: Secondary | ICD-10-CM | POA: Diagnosis not present

## 2024-05-26 DIAGNOSIS — R269 Unspecified abnormalities of gait and mobility: Secondary | ICD-10-CM | POA: Diagnosis not present

## 2024-05-28 ENCOUNTER — Encounter: Payer: Self-pay | Admitting: Podiatry

## 2024-05-28 NOTE — Progress Notes (Signed)
 Subjective:  Patient ID: Melinda [name], female    DOB: 03-07-1966,  MRN: [id number]  Melinda [name] presents to clinic today for at risk foot care with history of diabetic neuropathy and painful mycotic toenails of both feet that are difficult to trim. Pain interferes with daily activities and wearing enclosed shoe gear comfortably.  Chief Complaint  Patient presents with   Diabetes    DFC NIDDM A1C 6.7. Toenail trim. LOV with PCP 03/2024 after ED visit.   New problem(s): None.   PCP is [place], [place]  E, PA.  Allergies  Allergen Reactions   Ceftriaxone Anaphylaxis and Other (See Comments)    ROCEPHIN   Metformin  Anaphylaxis    ALL   Penicillin G Shortness Of Breath   Penicillins Shortness Of Breath    Has patient had a PCN reaction causing immediate rash, facial/tongue/throat swelling, SOB or lightheadedness with hypotension: Yes Has patient had a PCN reaction causing severe rash involving mucus membranes or skin necrosis: No Has patient had a PCN reaction that required hospitalization No Has patient had a PCN reaction occurring within the last 10 years: No If all of the above answers are NO, then may proceed with Cephalosporin use.    Shellfish Allergy Anaphylaxis   Shellfish Protein-Containing Drug Products Anaphylaxis   Citrus Rash   Flonase  [Fluticasone  Propionate]     Makes migraines worse   Gold-Containing Drug Products     HANDS ITCH   Nickel     HANDS SWELL    Review of Systems: Negative except as noted in the HPI.  Objective: No changes noted in today's physical examination. There were no vitals filed for this visit. Melinda [name] is a pleasant 58 y.o. female morbidly obese in NAD. AAO x 3.  Vascular Examination: Capillary refill time immediate b/l. Palpable pedal pulses. Pedal hair present b/l. Pedal edema trace b/l. No pain with calf compression b/l. Skin temperature gradient WNL b/l. No cyanosis or clubbing b/l. No ischemia or gangrene noted b/l.    Neurological Examination: Pt has subjective symptoms of neuropathy. Protective sensation intact 5/5 intact bilaterally with 10g monofilament b/l.  Dermatological Examination: Pedal skin with normal turgor, texture and tone b/l.  No open wounds. No interdigital macerations.   Toenails 2-5 b/l thick, discolored, elongated with subungual debris and pain on dorsal palpation.   No corns, calluses, nor porokeratotic lesions.  Incurvated nailplate both borders of left hallux and both borders of right hallux.  Nail border hypertrophy absent. There is tenderness to palpation. Sign(s) of infection: no clinical signs of infection noted on examination today..  Musculoskeletal Examination: Muscle strength 3/5 to all lower extremity muscle groups bilaterally. Pes planus deformity noted bilateral LE.  Radiographs: None  Assessment/Plan: 1. Pain due to onychomycosis of toenails of both feet   2. Diabetic peripheral neuropathy associated with type 2 diabetes mellitus (HCC)    Patient was evaluated and treated. All patient's and/or POA's questions/concerns addressed on today's visit. Mycotic toenails 1-5 b/l debrided in length and girth without incident.  Continue daily foot inspections and monitor blood glucose per PCP/Endocrinologist's recommendations.Continue soft, supportive shoe gear daily. Report any pedal injuries to medical professional. Call office if there are any quesitons/concerns. -Patient/POA to call should there be question/concern in the interim.   Return in about 3 months (around 08/19/2024).  Melinda [name], DPM      [facility] LOCATION: [address]  [place], [place] [address]                   Office [phone]   [facility] LOCATION: [address] [place], [place] [address] Office [phone]

## 2024-05-31 DIAGNOSIS — R269 Unspecified abnormalities of gait and mobility: Secondary | ICD-10-CM | POA: Diagnosis not present

## 2024-06-01 DIAGNOSIS — R269 Unspecified abnormalities of gait and mobility: Secondary | ICD-10-CM | POA: Diagnosis not present

## 2024-06-02 DIAGNOSIS — R269 Unspecified abnormalities of gait and mobility: Secondary | ICD-10-CM | POA: Diagnosis not present

## 2024-06-03 DIAGNOSIS — R269 Unspecified abnormalities of gait and mobility: Secondary | ICD-10-CM | POA: Diagnosis not present

## 2024-06-04 DIAGNOSIS — R269 Unspecified abnormalities of gait and mobility: Secondary | ICD-10-CM | POA: Diagnosis not present

## 2024-06-07 DIAGNOSIS — F411 Generalized anxiety disorder: Secondary | ICD-10-CM | POA: Diagnosis not present

## 2024-06-07 DIAGNOSIS — F4310 Post-traumatic stress disorder, unspecified: Secondary | ICD-10-CM | POA: Diagnosis not present

## 2024-06-07 DIAGNOSIS — F331 Major depressive disorder, recurrent, moderate: Secondary | ICD-10-CM | POA: Diagnosis not present

## 2024-06-07 DIAGNOSIS — R269 Unspecified abnormalities of gait and mobility: Secondary | ICD-10-CM | POA: Diagnosis not present

## 2024-06-08 DIAGNOSIS — R269 Unspecified abnormalities of gait and mobility: Secondary | ICD-10-CM | POA: Diagnosis not present

## 2024-06-09 DIAGNOSIS — R269 Unspecified abnormalities of gait and mobility: Secondary | ICD-10-CM | POA: Diagnosis not present

## 2024-06-10 DIAGNOSIS — R269 Unspecified abnormalities of gait and mobility: Secondary | ICD-10-CM | POA: Diagnosis not present

## 2024-06-11 DIAGNOSIS — R269 Unspecified abnormalities of gait and mobility: Secondary | ICD-10-CM | POA: Diagnosis not present

## 2024-06-12 DIAGNOSIS — R269 Unspecified abnormalities of gait and mobility: Secondary | ICD-10-CM | POA: Diagnosis not present

## 2024-06-13 DIAGNOSIS — R269 Unspecified abnormalities of gait and mobility: Secondary | ICD-10-CM | POA: Diagnosis not present

## 2024-06-14 DIAGNOSIS — R269 Unspecified abnormalities of gait and mobility: Secondary | ICD-10-CM | POA: Diagnosis not present

## 2024-06-15 DIAGNOSIS — Z8639 Personal history of other endocrine, nutritional and metabolic disease: Secondary | ICD-10-CM | POA: Diagnosis not present

## 2024-06-15 DIAGNOSIS — D3501 Benign neoplasm of right adrenal gland: Secondary | ICD-10-CM | POA: Diagnosis not present

## 2024-06-15 DIAGNOSIS — G629 Polyneuropathy, unspecified: Secondary | ICD-10-CM | POA: Diagnosis not present

## 2024-06-15 DIAGNOSIS — Z85850 Personal history of malignant neoplasm of thyroid: Secondary | ICD-10-CM | POA: Diagnosis not present

## 2024-06-15 DIAGNOSIS — E270 Other adrenocortical overactivity: Secondary | ICD-10-CM | POA: Diagnosis not present

## 2024-06-15 DIAGNOSIS — E119 Type 2 diabetes mellitus without complications: Secondary | ICD-10-CM | POA: Diagnosis not present

## 2024-06-15 DIAGNOSIS — E890 Postprocedural hypothyroidism: Secondary | ICD-10-CM | POA: Diagnosis not present

## 2024-06-15 DIAGNOSIS — G4733 Obstructive sleep apnea (adult) (pediatric): Secondary | ICD-10-CM | POA: Diagnosis not present

## 2024-06-15 DIAGNOSIS — R269 Unspecified abnormalities of gait and mobility: Secondary | ICD-10-CM | POA: Diagnosis not present

## 2024-06-16 DIAGNOSIS — R269 Unspecified abnormalities of gait and mobility: Secondary | ICD-10-CM | POA: Diagnosis not present

## 2024-06-17 DIAGNOSIS — R269 Unspecified abnormalities of gait and mobility: Secondary | ICD-10-CM | POA: Diagnosis not present

## 2024-06-18 DIAGNOSIS — R269 Unspecified abnormalities of gait and mobility: Secondary | ICD-10-CM | POA: Diagnosis not present

## 2024-06-21 DIAGNOSIS — R269 Unspecified abnormalities of gait and mobility: Secondary | ICD-10-CM | POA: Diagnosis not present

## 2024-06-22 DIAGNOSIS — R269 Unspecified abnormalities of gait and mobility: Secondary | ICD-10-CM | POA: Diagnosis not present

## 2024-06-23 DIAGNOSIS — R269 Unspecified abnormalities of gait and mobility: Secondary | ICD-10-CM | POA: Diagnosis not present

## 2024-06-24 DIAGNOSIS — R269 Unspecified abnormalities of gait and mobility: Secondary | ICD-10-CM | POA: Diagnosis not present

## 2024-06-29 ENCOUNTER — Other Ambulatory Visit: Payer: Self-pay | Admitting: Internal Medicine

## 2024-06-29 DIAGNOSIS — Z1231 Encounter for screening mammogram for malignant neoplasm of breast: Secondary | ICD-10-CM

## 2024-07-07 ENCOUNTER — Ambulatory Visit
Admission: RE | Admit: 2024-07-07 | Discharge: 2024-07-07 | Disposition: A | Discharge status: Home / Self Care | Source: Ambulatory Visit | Attending: Internal Medicine | Admitting: Internal Medicine

## 2024-07-07 DIAGNOSIS — Z1231 Encounter for screening mammogram for malignant neoplasm of breast: Secondary | ICD-10-CM

## 2024-07-16 ENCOUNTER — Other Ambulatory Visit: Payer: Self-pay | Admitting: Neurology

## 2024-07-20 ENCOUNTER — Telehealth: Payer: Self-pay | Admitting: Neurology

## 2024-07-20 NOTE — Telephone Encounter (Signed)
 [facility] stating that they are requesting a refill for topiramate  200 mg. She stated pt is new to the pharmacy and would need Rx called in or sent electronically. She also stated that they are a Mail order.   PH: [phone]  Fax: [phone]

## 2024-07-20 NOTE — Telephone Encounter (Signed)
 Telephone call to patient to ensure of transfer of medication to new pharmacy. Per patient she has changed her mind she wants to keep her medication at [facility] pharmacy.   DO NOT TRANSFER medication

## 2024-09-01 ENCOUNTER — Ambulatory Visit: Admitting: Podiatry

## 2025-01-06 ENCOUNTER — Ambulatory Visit: Admitting: Neurology
